# Patient Record
Sex: Male | Born: 1970 | Hispanic: No | Marital: Single | Smoking: Former smoker
Health system: Southern US, Community
[De-identification: ages and names within clinical notes are randomized; demographics above are authoritative.]

## PROBLEM LIST (undated history)

## (undated) DIAGNOSIS — F419 Anxiety disorder, unspecified: Secondary | ICD-10-CM

## (undated) DIAGNOSIS — Z973 Presence of spectacles and contact lenses: Secondary | ICD-10-CM

## (undated) DIAGNOSIS — F329 Major depressive disorder, single episode, unspecified: Secondary | ICD-10-CM

## (undated) DIAGNOSIS — I739 Peripheral vascular disease, unspecified: Secondary | ICD-10-CM

## (undated) DIAGNOSIS — N186 End stage renal disease: Secondary | ICD-10-CM

## (undated) DIAGNOSIS — D649 Anemia, unspecified: Secondary | ICD-10-CM

## (undated) DIAGNOSIS — E119 Type 2 diabetes mellitus without complications: Secondary | ICD-10-CM

## (undated) DIAGNOSIS — N189 Chronic kidney disease, unspecified: Secondary | ICD-10-CM

## (undated) DIAGNOSIS — F4310 Post-traumatic stress disorder, unspecified: Secondary | ICD-10-CM

## (undated) DIAGNOSIS — I509 Heart failure, unspecified: Secondary | ICD-10-CM

## (undated) DIAGNOSIS — Z5189 Encounter for other specified aftercare: Secondary | ICD-10-CM

## (undated) DIAGNOSIS — F32A Depression, unspecified: Secondary | ICD-10-CM

## (undated) DIAGNOSIS — G629 Polyneuropathy, unspecified: Secondary | ICD-10-CM

## (undated) DIAGNOSIS — E785 Hyperlipidemia, unspecified: Secondary | ICD-10-CM

## (undated) HISTORY — DX: Anxiety disorder, unspecified: F41.9

## (undated) HISTORY — PX: CHOLECYSTECTOMY: SHX55

## (undated) HISTORY — DX: Major depressive disorder, single episode, unspecified: F32.9

## (undated) HISTORY — DX: Depression, unspecified: F32.A

## (undated) HISTORY — PX: FOOT SURGERY: SHX648

## (undated) HISTORY — DX: Encounter for other specified aftercare: Z51.89

## (undated) HISTORY — DX: Hyperlipidemia, unspecified: E78.5

## (undated) HISTORY — PX: BELOW KNEE LEG AMPUTATION: SUR23

## (undated) SURGERY — Surgical Case
Anesthesia: *Unknown

---

## 2013-11-12 ENCOUNTER — Other Ambulatory Visit (HOSPITAL_COMMUNITY): Payer: Self-pay | Admitting: Internal Medicine

## 2013-11-12 DIAGNOSIS — M869 Osteomyelitis, unspecified: Secondary | ICD-10-CM

## 2013-11-13 ENCOUNTER — Ambulatory Visit (HOSPITAL_COMMUNITY)
Admission: RE | Admit: 2013-11-13 | Discharge: 2013-11-13 | Disposition: A | Discharge status: Home / Self Care | Payer: Medicaid Other | Source: Ambulatory Visit | Attending: Internal Medicine | Admitting: Internal Medicine

## 2013-11-13 DIAGNOSIS — M869 Osteomyelitis, unspecified: Secondary | ICD-10-CM

## 2013-11-13 DIAGNOSIS — Z452 Encounter for adjustment and management of vascular access device: Secondary | ICD-10-CM | POA: Insufficient documentation

## 2013-11-13 IMAGING — XA DG FLUORO GUIDE CV LINE
1 series · 3 of 3 positions shown · non-contrast
Comparison: none

CLINICAL DATA: Existing right arm PICC line unable to get blood
return, request for exchange.

[Series 1: run · 3 of 3 slices shown]
[im 1/3]
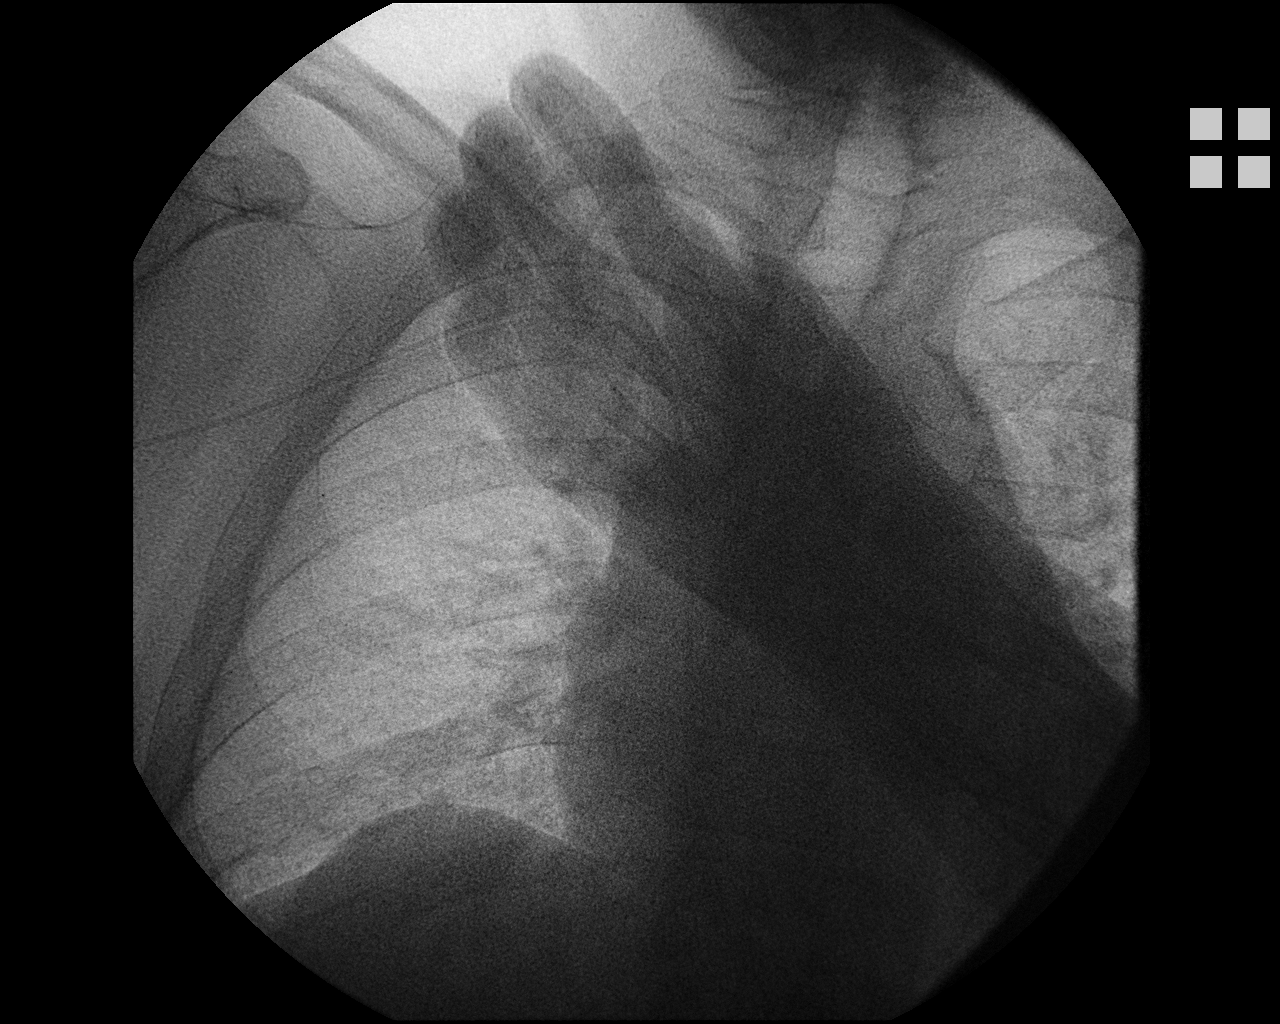
[im 2/3]
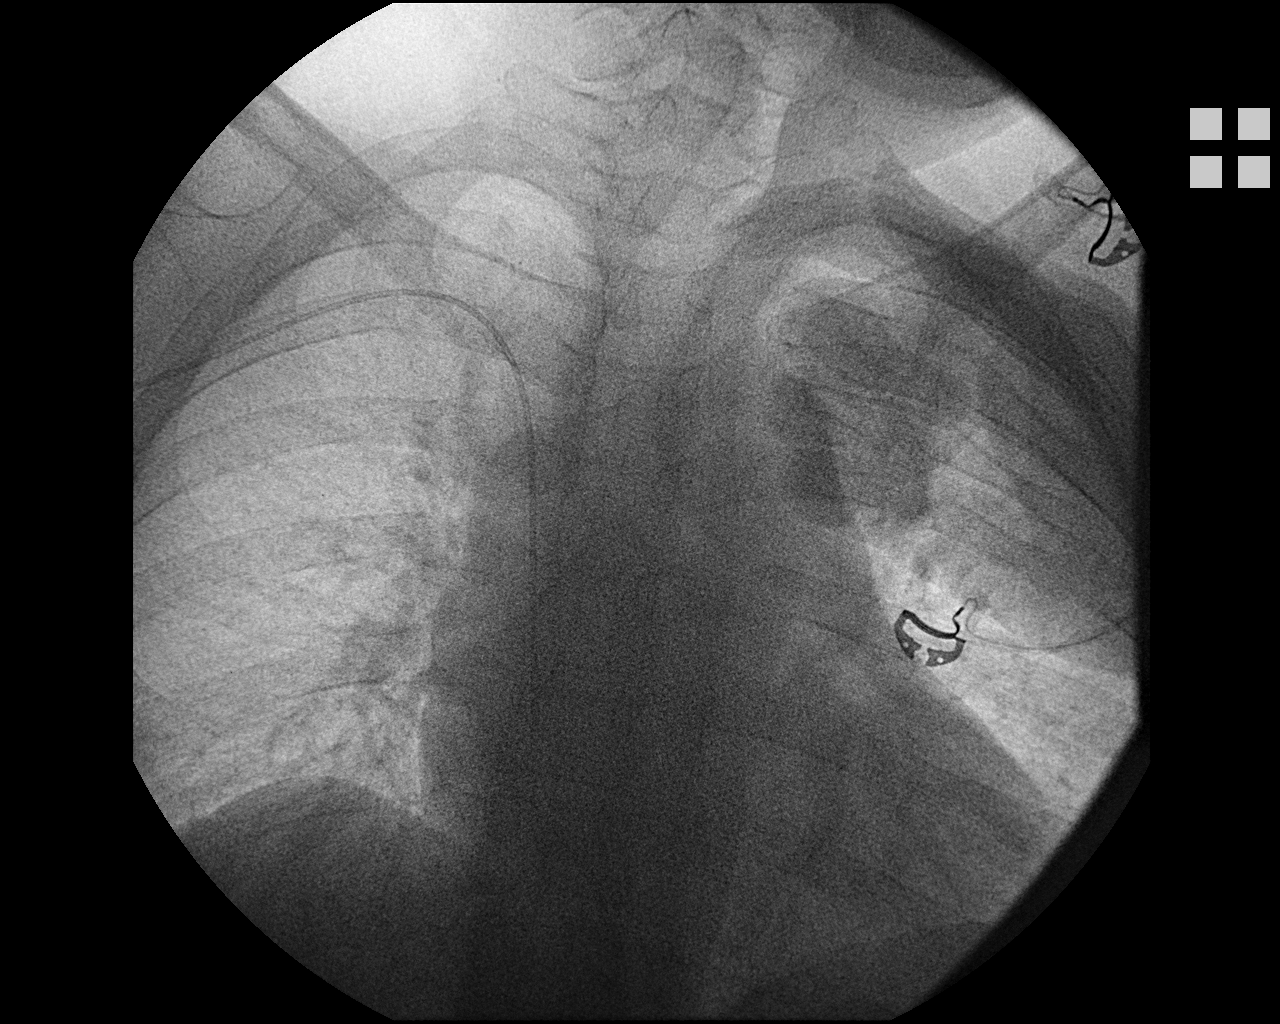
[im 3/3]
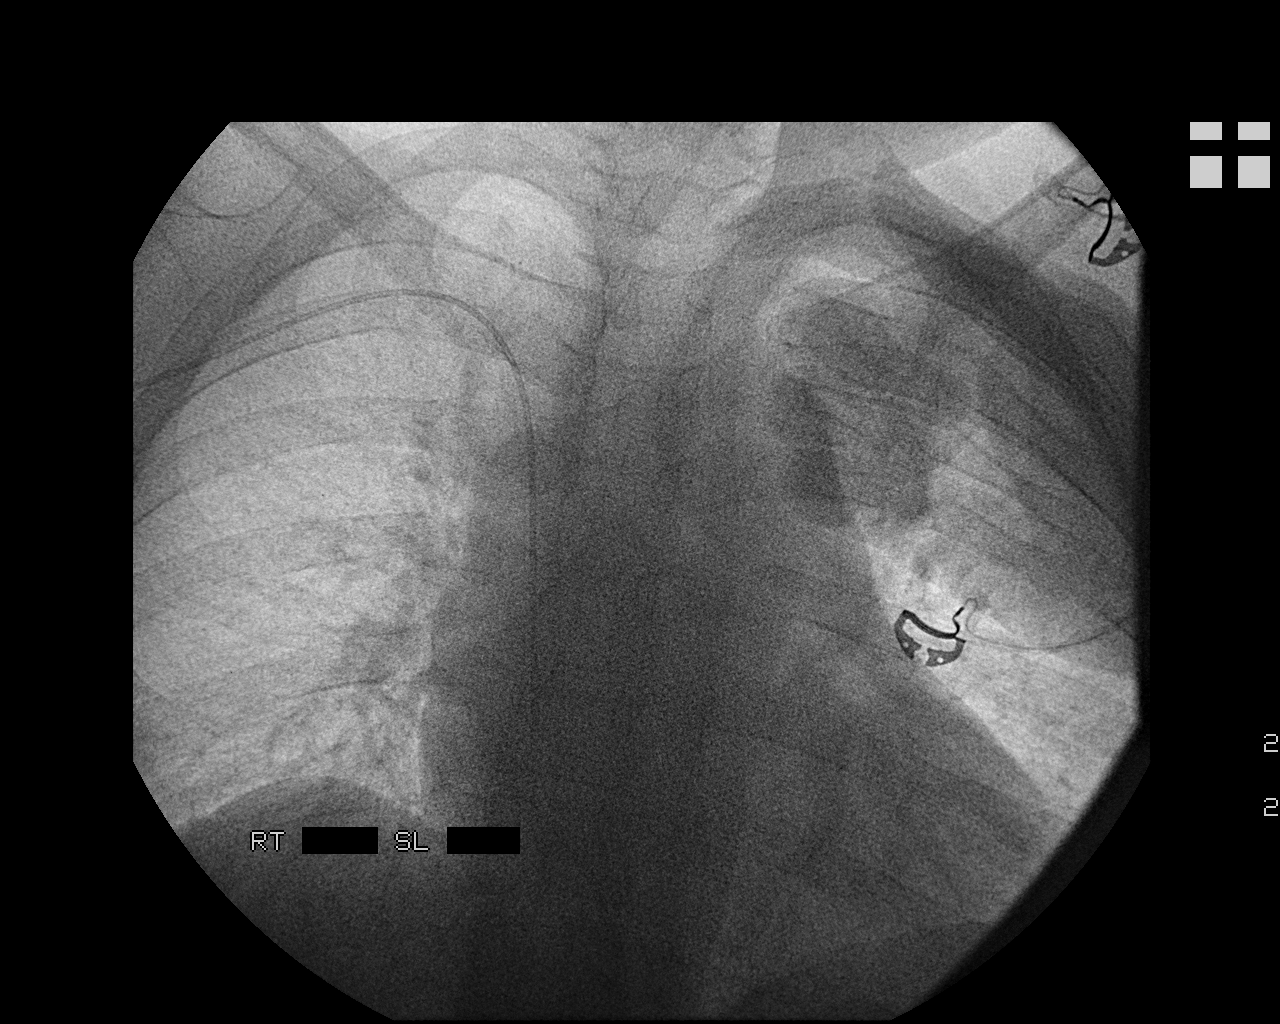

[3 of 3 positions shown; findings below may reference images not displayed]

EXAM:
SUCCESSFUL RIGHT ARM SINGLE LUMEN POWER INJECTABLE PICC LINE
EXCHANGE WITH FLUOROSCOPIC GUIDANCE

FLUOROSCOPY TIME:  30 seconds

PROCEDURE:
The patient was advised of the possible risks and complications and
agreed to undergo the procedure. The patient was then brought to the
angiographic suite for the procedure.

The right arm and existing PICC was prepped with chlorhexidine,
draped in the usual sterile fashion using maximum barrier technique
(cap and mask, sterile gown, sterile gloves, large sterile sheet,
hand hygiene and cutaneous antisepsis) and infiltrated locally with
1% Lidocaine.

The existing PICC was cut and a 0.018 wire was introduced in to the
vein. Over this, a 5 French single lumen power-injectable PICC was
advanced to the lower SVC/right atrial junction. Fluoroscopy during
the procedure and fluoro spot radiograph confirms appropriate
catheter position. The catheter was flushed and covered with a
sterile dressing.

Catheter length:  42 cm

Complications:  None immediate.
IMPRESSION: Successful right arm Power injectable PICC line exchange with
fluoroscopic guidance. The catheter is ready for use.

## 2013-11-13 MED ORDER — CHLORHEXIDINE GLUCONATE 4 % EX LIQD
CUTANEOUS | Status: AC
  Filled 2013-11-13: qty 15

## 2013-11-13 MED ORDER — LIDOCAINE HCL 1 % IJ SOLN
INTRAMUSCULAR | Status: AC
  Filled 2013-11-13: qty 20

## 2013-11-13 NOTE — Procedures (Signed)
Successful exchange of right arm single lumen PICC line. Length 42cm Tip at lower SVC/RA No complications Ready for use.  Tsosie Billing PA-C Interventional Radiology  11/13/13  10:07 AM

## 2013-11-18 ENCOUNTER — Emergency Department (HOSPITAL_COMMUNITY)
Admission: EM | Admit: 2013-11-18 | Discharge: 2013-11-18 | Disposition: A | Discharge status: Home / Self Care | Payer: 59 | Attending: Emergency Medicine | Admitting: Emergency Medicine

## 2013-11-18 ENCOUNTER — Encounter (HOSPITAL_COMMUNITY): Payer: Self-pay | Admitting: Emergency Medicine

## 2013-11-18 DIAGNOSIS — Z8669 Personal history of other diseases of the nervous system and sense organs: Secondary | ICD-10-CM | POA: Insufficient documentation

## 2013-11-18 DIAGNOSIS — E119 Type 2 diabetes mellitus without complications: Secondary | ICD-10-CM | POA: Insufficient documentation

## 2013-11-18 DIAGNOSIS — Z7982 Long term (current) use of aspirin: Secondary | ICD-10-CM | POA: Insufficient documentation

## 2013-11-18 DIAGNOSIS — F911 Conduct disorder, childhood-onset type: Secondary | ICD-10-CM | POA: Insufficient documentation

## 2013-11-18 DIAGNOSIS — Z79899 Other long term (current) drug therapy: Secondary | ICD-10-CM | POA: Diagnosis not present

## 2013-11-18 DIAGNOSIS — S91331A Puncture wound without foreign body, right foot, initial encounter: Secondary | ICD-10-CM | POA: Insufficient documentation

## 2013-11-18 DIAGNOSIS — D649 Anemia, unspecified: Secondary | ICD-10-CM | POA: Insufficient documentation

## 2013-11-18 DIAGNOSIS — R454 Irritability and anger: Secondary | ICD-10-CM

## 2013-11-18 DIAGNOSIS — Z046 Encounter for general psychiatric examination, requested by authority: Secondary | ICD-10-CM | POA: Diagnosis present

## 2013-11-18 DIAGNOSIS — Z5189 Encounter for other specified aftercare: Secondary | ICD-10-CM

## 2013-11-18 HISTORY — DX: Type 2 diabetes mellitus without complications: E11.9

## 2013-11-18 HISTORY — DX: Polyneuropathy, unspecified: G62.9

## 2013-11-18 HISTORY — DX: Anemia, unspecified: D64.9

## 2013-11-18 HISTORY — DX: Post-traumatic stress disorder, unspecified: F43.10
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
## 2013-11-18 NOTE — Discharge Instructions (Signed)
°  Try to remain calm, and to avoid confrontations.  Call your primary care doctor for an appointment to be seen, and had her wounds checked within the next week. Also, asked them about restarting the wound VAC. Return here, if needed, for problems.     Anger Management Anger is a normal human emotion. However, anger can range from mild irritation to rage. When your anger becomes harmful to yourself or others, it is unhealthy anger.  CAUSES  There are many reasons for unhealthy anger. Many people learn how to express anger from observing how their family expressed anger. In troubled, chaotic, or abusive families, anger can be expressed as rage or even violence. Children can grow up never learning how healthy anger can be expressed. Factors that contribute to unhealthy anger include:   Drug or alcohol abuse.  Post-traumatic stress disorder.  Traumatic brain injury. COMPLICATIONS  People with unhealthy anger tend to overreact and retaliate against a real or imagined threat. The need to retaliate can turn into violence or verbal abuse against another person. Chronic anger can lead to health problems, such as hypertension, high blood pressure, and depression. TREATMENT  Exercising, relaxing, meditating, or writing out your feelings all can be beneficial in managing moderate anger. For unhealthy anger, the following methods may be used:  Cognitive-behavioral counseling (learning skills to change the thoughts that influence your mood).  Relaxation training.  Interpersonal counseling.  Assertive communication skills.  Medication. Document Released: 11/21/2006 Document Revised: 04/18/2011 Document Reviewed: 04/01/2010 [facility] Patient Information 2015 [place], [place]. This information is not intended to replace advice given to you by your health care provider. Make sure you discuss any questions you have with your health care provider.

## 2013-11-18 NOTE — ED Notes (Signed)
Report given to [place], Arbie Cookey. Patient with no complaints at this time. Respirations even and unlabored. Skin warm/dry. Discharge instructions reviewed with patient at this time. Patient given opportunity to voice concerns/ask questions. Patient discharged at this time and left Emergency Department in wheelchair.

## 2013-12-02 ENCOUNTER — Other Ambulatory Visit (HOSPITAL_COMMUNITY): Payer: Self-pay | Admitting: Internal Medicine

## 2013-12-02 ENCOUNTER — Ambulatory Visit (HOSPITAL_COMMUNITY)
Admission: RE | Admit: 2013-12-02 | Discharge: 2013-12-02 | Disposition: A | Discharge status: Home / Self Care | Payer: Medicaid Other | Source: Ambulatory Visit | Attending: Internal Medicine | Admitting: Internal Medicine

## 2013-12-02 DIAGNOSIS — Z452 Encounter for adjustment and management of vascular access device: Secondary | ICD-10-CM | POA: Insufficient documentation

## 2013-12-02 DIAGNOSIS — B999 Unspecified infectious disease: Secondary | ICD-10-CM

## 2013-12-02 IMAGING — XA IR FLUORO GUIDE CV LINE*R*
2 series · 3 of 3 positions shown · non-contrast
Comparison: none

CLINICAL DATA: 43-year-old male with poorly functioning right upper
extremity PICC. The catheter flushes but will not aspirate. He
requires the line for at least 2 additional weeks including used for
blood draws. He presents for PICC exchanged

EXAM:
IR RIGHT FLOURO GUIDE CV LINE (PICC) EXCHANGE
FLUOROSCOPY TIME:  6 seconds
3 mGy
TECHNIQUE: The right arm was prepped with chlorhexidine, draped in the usual
sterile fashion using maximum barrier technique (cap and mask,
sterile gown, sterile gloves, large sterile sheet, hand hygiene and
cutaneous antiseptic). Local anesthesia was attained by infiltration
with 1% lidocaine.

[Series 1: fl neuro · 1 of 1 slices shown (1 of 2)]
[im 1/1]
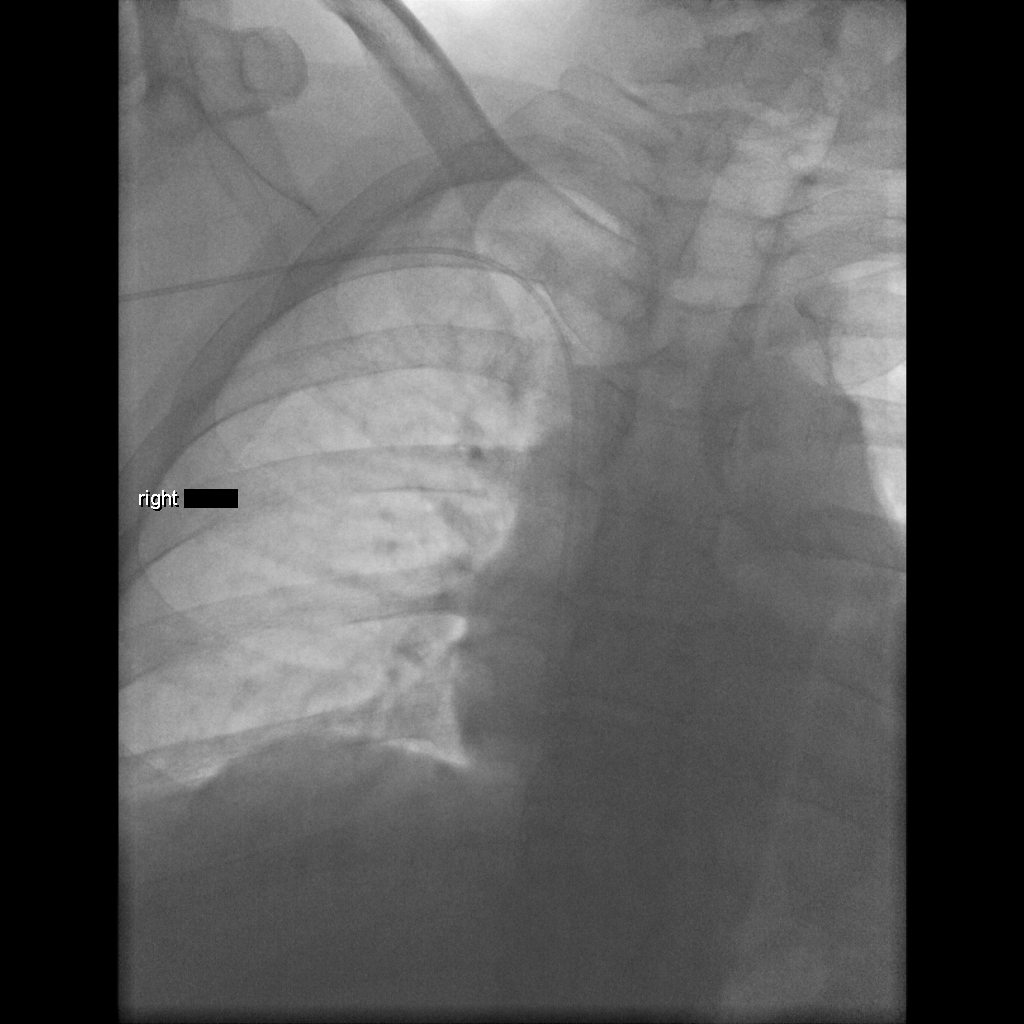

[Series 2: fl neuro · 2 of 2 slices shown (2 of 2)]
[im 1/2]
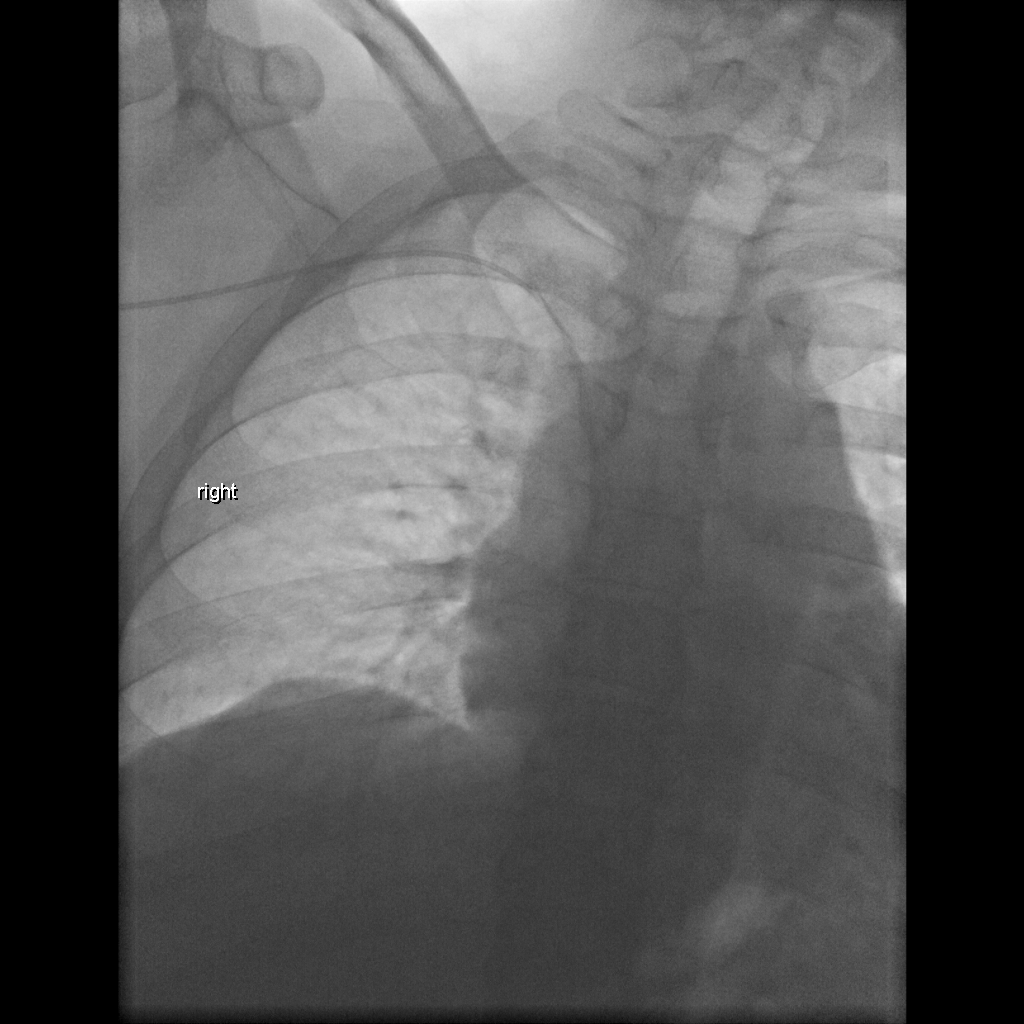
[im 2/2]
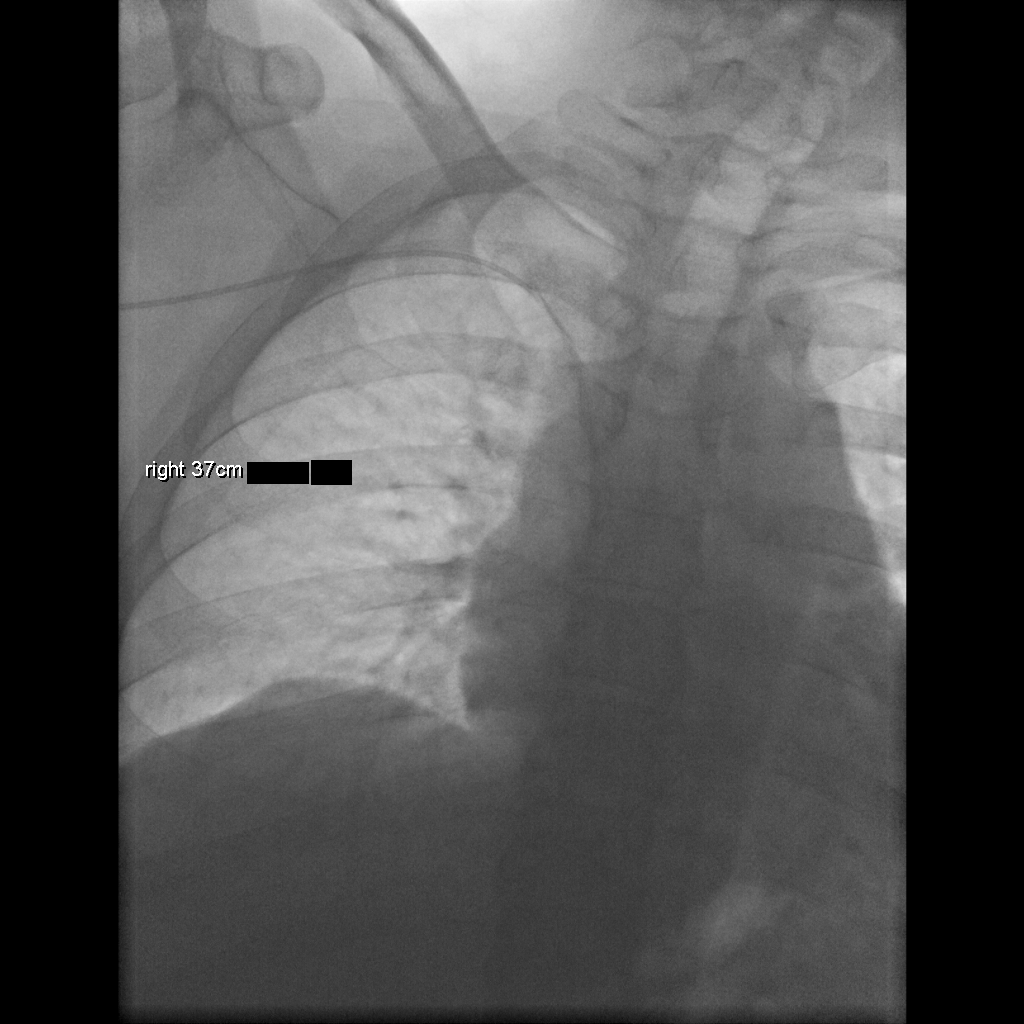

[3 of 3 positions shown; findings below may reference images not displayed]

The sutured to the existing PICC was cut. The catheter was cut close
to the skin edge and a 0.018 inch wire threaded through the catheter
and into the heart. The PICC was removed over the wire. A new
peel-away sheath was advanced over the wire into the upper arm. A
new 37 cm single-lumen power injectable PICC was advanced, and
positioned with its tip at the lower SVC/right atrial junction.
Fluoroscopy during the procedure and fluoro spot radiograph confirms
appropriate catheter position. The catheter was flushed, secured to
the skin with Prolene sutures, and covered with a sterile dressing.

COMPLICATIONS:
None.  The patient tolerated the procedure well.
IMPRESSION: Successful exchange for a new right upper extremity single-lumen
power PICC with fluoroscopic guidance. The catheter is ready for
use.

## 2013-12-02 MED ORDER — CHLORHEXIDINE GLUCONATE 4 % EX LIQD
CUTANEOUS | Status: DC
Start: 2013-12-02 — End: 2013-12-03
  Filled 2013-12-02: qty 15

## 2013-12-02 MED ORDER — HEPARIN SOD (PORK) LOCK FLUSH 100 UNIT/ML IV SOLN
INTRAVENOUS | Status: AC
  Filled 2013-12-02: qty 5

## 2013-12-02 MED ORDER — LIDOCAINE HCL 1 % IJ SOLN
INTRAMUSCULAR | Status: AC
  Filled 2013-12-02: qty 20

## 2015-03-03 ENCOUNTER — Ambulatory Visit (INDEPENDENT_AMBULATORY_CARE_PROVIDER_SITE_OTHER): Payer: Medicaid Other | Admitting: Family Medicine

## 2015-03-03 ENCOUNTER — Encounter: Payer: Self-pay | Admitting: Family Medicine

## 2015-03-03 VITALS — BP 130/79 | HR 91 | Temp 98.0°F | Ht 72.0 in

## 2015-03-03 DIAGNOSIS — Z794 Long term (current) use of insulin: Secondary | ICD-10-CM

## 2015-03-03 DIAGNOSIS — M549 Dorsalgia, unspecified: Secondary | ICD-10-CM | POA: Insufficient documentation

## 2015-03-03 DIAGNOSIS — E11621 Type 2 diabetes mellitus with foot ulcer: Secondary | ICD-10-CM | POA: Diagnosis not present

## 2015-03-03 DIAGNOSIS — L97509 Non-pressure chronic ulcer of other part of unspecified foot with unspecified severity: Secondary | ICD-10-CM | POA: Diagnosis not present

## 2015-03-03 DIAGNOSIS — M5442 Lumbago with sciatica, left side: Secondary | ICD-10-CM

## 2015-03-03 DIAGNOSIS — E119 Type 2 diabetes mellitus without complications: Secondary | ICD-10-CM | POA: Insufficient documentation

## 2015-03-03 DIAGNOSIS — E1122 Type 2 diabetes mellitus with diabetic chronic kidney disease: Secondary | ICD-10-CM | POA: Insufficient documentation

## 2015-03-03 DIAGNOSIS — E785 Hyperlipidemia, unspecified: Secondary | ICD-10-CM | POA: Diagnosis not present

## 2015-03-03 DIAGNOSIS — M5441 Lumbago with sciatica, right side: Secondary | ICD-10-CM

## 2015-03-03 DIAGNOSIS — M545 Low back pain, unspecified: Secondary | ICD-10-CM | POA: Insufficient documentation

## 2015-03-03 LAB — POCT GLYCOSYLATED HEMOGLOBIN (HGB A1C): HEMOGLOBIN A1C: 8.2

## 2015-03-03 MED ORDER — TRAMADOL HCL 50 MG PO TABS: 50.0000 mg | ORAL_TABLET | Freq: Two times a day (BID) | ORAL | Status: DC | PRN

## 2015-03-03 NOTE — Progress Notes (Signed)
   HPI  Patient presents today here today to establish care and to discuss pain.  Explains that he has recently moved from [facility]. Previously he was homeless and had a lot of difficulty with diabetes management and developing ulcers which caused amputations.  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  He describes that in April 2016 he was admitted to [facility] for suicidal attempt and stayed there for about 6 weeks. He denies suicidal ideation today, depression, and anxiety.  He would like to pursue getting a prosthesis for his right lower extremity.  He's been out of several medications for at least 6 weeks  He was previously on Xanax, BuSpar, Pletal, ferrous sulfate, oxycodone, and trazodone. He explains that he's been off of Xanax and oxycodone since he was admitted to [facility] in April.  He is now living with her friends locally. He has no problem obtaining food. States he was previously on 10 units of 70/30 every day.  He describes generalized low back pain with intermittent sciatica, he has a lot of discomfort as he has to stay in the wheelchair constantly. He's previously been told not to take NSAIDs due to renal function Tylenol is not helping   PMH: Smoking status noted His past medical, surgical, social, and family history reviewed and updated in EMR ROS: Per HPI  Objective: BP 130/79 mmHg  Pulse 91  Temp(Src) 98 F (36.7 C) (Oral)  Ht 6' (1.829 m)  Wt  Gen: NAD, alert, cooperative with exam HEENT: NCAT, TMs normal bilaterally, moist mucous membranes, nares clear CV: RRR, good S1/S2, no murmur Resp: CTABL, no wheezes, non-labored Ext: Right lower extremity with BKA with well-healed scar on his stump, his left lower extremity has his first and second toe amputated with well-healed scars Neuro: Alert and oriented, decreased sensation on left lower  extremity, no sensation to monofilament across plantar foot and up to his knee on his left lower extremity  Assessment and plan:  # Diabetes Has not been on any medications for several months Previously treated with insulin A1c today is 8.3, I will await his renal function to make more decisions. Likely start Lantus  # Hyperlipidemia Continue Lipitor Fasting labs today  # Mood disorder PTSD, anxiety, depression He has been hospitalized for a very significant amount of time for suicidal attempt He denies suicidal thoughts today, depression, and anxiety. No medications for now, avoid opiates and benzodiazepines  # Back pain I suspect this is really just mechanical back pain from sitting in his wheelchair so frequently Think it's very likely that his renal function will be to 4 to justify using NSAIDs, Tylenol is not helping Tramadol  Follow-up one month   Orders Placed This Encounter  Procedures  . CMP14+EGFR  . CBC  . Lipid Panel  . TSH + free T4  . POCT glycosylated hemoglobin (Hb A1C)    Meds ordered this encounter  Medications  . traMADol (ULTRAM) 50 MG tablet    Sig: Take 1 tablet (50 mg total) by mouth every 12 (twelve) hours as needed.    Dispense:  60 tablet    Refill:  [place], MD [facility] 03/03/2015, 3:06 PM

## 2015-03-03 NOTE — Patient Instructions (Signed)
Great to meet you!  Lets see you back in 1 month  We will call with your results within 1 week  I will ask around about the best way to pursue prosthesis

## 2015-03-04 ENCOUNTER — Encounter: Payer: Self-pay | Admitting: Family Medicine

## 2015-03-04 ENCOUNTER — Other Ambulatory Visit: Payer: Self-pay | Admitting: Family Medicine

## 2015-03-04 DIAGNOSIS — L97509 Non-pressure chronic ulcer of other part of unspecified foot with unspecified severity: Secondary | ICD-10-CM

## 2015-03-04 DIAGNOSIS — Z794 Long term (current) use of insulin: Secondary | ICD-10-CM

## 2015-03-04 DIAGNOSIS — N184 Chronic kidney disease, stage 4 (severe): Secondary | ICD-10-CM

## 2015-03-04 DIAGNOSIS — E11621 Type 2 diabetes mellitus with foot ulcer: Secondary | ICD-10-CM

## 2015-03-04 LAB — CBC
HEMATOCRIT: 32.4 % — AB (ref 37.5–51.0)
Hemoglobin: 10.6 g/dL — ABNORMAL LOW (ref 12.6–17.7)
MCH: 29.8 pg (ref 26.6–33.0)
MCHC: 32.7 g/dL (ref 31.5–35.7)
MCV: 91 fL (ref 79–97)
Platelets: 259 10*3/uL (ref 150–379)
RBC: 3.56 x10E6/uL — AB (ref 4.14–5.80)
RDW: 13.2 % (ref 12.3–15.4)
WBC: 9.6 10*3/uL (ref 3.4–10.8)

## 2015-03-04 LAB — CMP14+EGFR
ALT: 11 IU/L (ref 0–44)
AST: 10 IU/L (ref 0–40)
Albumin/Globulin Ratio: 1.2 (ref 1.1–2.5)
Albumin: 3.1 g/dL — ABNORMAL LOW (ref 3.5–5.5)
Alkaline Phosphatase: 106 IU/L (ref 39–117)
BUN/Creatinine Ratio: 9 (ref 9–20)
BUN: 26 mg/dL — AB (ref 6–24)
Bilirubin Total: <0.2 mg/dL (ref 0.0–1.2)
CO2: 19 mmol/L (ref 18–29)
CREATININE: 3.05 mg/dL — AB (ref 0.76–1.27)
Calcium: 8.8 mg/dL (ref 8.7–10.2)
Chloride: 104 mmol/L (ref 96–106)
GFR calc Af Amer: 28 mL/min/{1.73_m2} — ABNORMAL LOW (ref >59)
GFR calc non Af Amer: 24 mL/min/{1.73_m2} — ABNORMAL LOW (ref >59)
GLUCOSE: 162 mg/dL — AB (ref 65–99)
Globulin, Total: 2.5 g/dL (ref 1.5–4.5)
Potassium: 5.4 mmol/L — ABNORMAL HIGH (ref 3.5–5.2)
Sodium: 139 mmol/L (ref 134–144)
Total Protein: 5.6 g/dL — ABNORMAL LOW (ref 6.0–8.5)

## 2015-03-04 LAB — LIPID PANEL
CHOL/HDL RATIO: 7.9 ratio — AB (ref 0.0–5.0)
Cholesterol, Total: 322 mg/dL — ABNORMAL HIGH (ref 100–199)
HDL: 41 mg/dL (ref >39)
TRIGLYCERIDES: 471 mg/dL — AB (ref 0–149)

## 2015-03-04 LAB — TSH+FREE T4
Free T4: 0.98 ng/dL (ref 0.82–1.77)
TSH: 2.68 u[IU]/mL (ref 0.450–4.500)

## 2015-03-05 ENCOUNTER — Telehealth: Payer: Self-pay | Admitting: Family Medicine

## 2015-03-05 DIAGNOSIS — Z89512 Acquired absence of left leg below knee: Secondary | ICD-10-CM | POA: Insufficient documentation

## 2015-03-05 DIAGNOSIS — Z89511 Acquired absence of right leg below knee: Secondary | ICD-10-CM | POA: Insufficient documentation

## 2015-03-05 DIAGNOSIS — N184 Chronic kidney disease, stage 4 (severe): Secondary | ICD-10-CM

## 2015-03-05 MED ORDER — ATORVASTATIN CALCIUM 10 MG PO TABS: 10.0000 mg | ORAL_TABLET | Freq: Every evening | ORAL | Status: DC

## 2015-03-05 NOTE — Telephone Encounter (Signed)
Called and discussed labs, He has a nephro referral in by Dr. Livia Snellen in response to his critical high cre.   His K is elevated, we will need to repeat at our next visit. No palpitations.   Tramadol is working well, with his ESRD consider BID as max dose.   His cholesterol and trigs are high. There is some discussion about bvenefit of Statins in ESRD (consider him CKD 4) but considering his numbers and DM2 I am restarting lipitor.  Trigs hopefully improve with diabetic control. No fibrate for now, medicaid will not cover Vascepa/lovaza and I want to move step wise with most important things first.   I explained most important is restarting insulin, then statin. Insulin dosing carefully with reduced clearance due to renal disease so I will have him follow up within 1 week with our clinical pharmacist for likely lantus.   Refer to ortho for prosthesis eval.   Laroy Apple, MD [facility] 03/05/2015, 12:09 PM

## 2015-03-13 NOTE — Telephone Encounter (Signed)
Detailed message left with patients emergency contact number to have patient call us.

## 2015-03-17 NOTE — Telephone Encounter (Signed)
Multiple attempts have been made to contact pt and he hasn't returned our calls. Will close encounter.

## 2015-04-03 ENCOUNTER — Encounter: Payer: Self-pay | Admitting: Family Medicine

## 2015-04-03 ENCOUNTER — Ambulatory Visit (INDEPENDENT_AMBULATORY_CARE_PROVIDER_SITE_OTHER): Payer: Medicaid Other | Admitting: Family Medicine

## 2015-04-03 VITALS — BP 143/85 | HR 89 | Temp 97.0°F

## 2015-04-03 DIAGNOSIS — Z89511 Acquired absence of right leg below knee: Secondary | ICD-10-CM | POA: Diagnosis not present

## 2015-04-03 DIAGNOSIS — L97509 Non-pressure chronic ulcer of other part of unspecified foot with unspecified severity: Secondary | ICD-10-CM | POA: Diagnosis not present

## 2015-04-03 DIAGNOSIS — M5442 Lumbago with sciatica, left side: Secondary | ICD-10-CM | POA: Diagnosis not present

## 2015-04-03 DIAGNOSIS — N184 Chronic kidney disease, stage 4 (severe): Secondary | ICD-10-CM

## 2015-04-03 DIAGNOSIS — E11621 Type 2 diabetes mellitus with foot ulcer: Secondary | ICD-10-CM | POA: Diagnosis not present

## 2015-04-03 DIAGNOSIS — M5441 Lumbago with sciatica, right side: Secondary | ICD-10-CM | POA: Diagnosis not present

## 2015-04-03 DIAGNOSIS — Z794 Long term (current) use of insulin: Secondary | ICD-10-CM | POA: Diagnosis not present

## 2015-04-03 DIAGNOSIS — N189 Chronic kidney disease, unspecified: Secondary | ICD-10-CM

## 2015-04-03 MED ORDER — INSULIN GLARGINE 100 UNIT/ML SOLOSTAR PEN: 15.0000 [IU] | PEN_INJECTOR | Freq: Every day | SUBCUTANEOUS | Status: DC

## 2015-04-03 MED ORDER — INSULIN PEN NEEDLE 31G X 5 MM MISC: 1.0000 | Freq: Every day | Status: DC

## 2015-04-03 NOTE — Patient Instructions (Signed)
Great to see you!  Lets see you in 1 month for diabetes discussion  Start lantus, 15 units once daily Increase by 2 units per week until your fasting blood sugar is between 120 and 140 Do not go higher than 25 units before returning to the clinic

## 2015-04-03 NOTE — Progress Notes (Signed)
HPI  Patient presents today here for follow-up.  Status post amputation Patient feels he needs a new wheelchair. His current one seems to be getting worn out. He has an appointment with orthopedic surgeryat the end of next month to discuss options for prosthesis.  Diabetes Was started on insulin at the emergency room, he did not follow-up with our pharmacist as requested. He's tolerating insulin 70/30, 10 units a.m., 15 units p.m. Very well. No hypoglycemia Fasting blood sugar seems to be 130 - 150. He does not take it every single day he waits for his blood sugar to elevate in the morningto go ahead and take the dose. He is previously done well on Lantus.  PTSD Requests medication for his "nerves". States he did well on Xanax previously he understands why I declined to prescribe this at this time.  Danelle Berry so security so that he can receive his own check.  Kidney disease He has seen a nephrologist since our last visit, he has labs following with them  PMH: Smoking status noted ROS: Per HPI  Objective: BP 143/85 mmHg  Pulse 89  Temp(Src) 97 F (36.1 C) (Oral)  Ht   Wt  Gen: NAD, alert, cooperative with exam HEENT: NCAT CV: RRR, good S1/S2, no murmur Resp: CTABL, no wheezes, non-labored Ext: trace pitting edema LLE, R sided BKA Neuro: Alert and oriented to person, place, time, situation, and president  Assessment and plan:  # R sided BKA New wheelchair ordered, has ortho scheduled  # PTSD Declined Benzos Offered SSRI but I think its best he goes to [facility]  # Back pain Doing well with 2 pills of tramadol daily  # DM2 Uncontrolled, has restarted insulin I am changing from 70/30 to lantus given that he is taking it in reaction to hyperglycemia No problems with access to food No hypoglycemia Eye exam, optho ordered RTC 1 month Lantus 15 units increase by 2 units per week until fasting 130-150, stop at 25 units   ADL survey- No limitations in bathing,  dressing, grooming, mouth care, toileting, transferring bed to chair, or eating. Cannotwalk or climb stairs IADLs- independent in cooking, managing medications, using phone numbers are looking up numbers,doing laundry, or managing finances. Needs help with shopping, doing housework, or driving/using public transportation  I have written the letter so he can receive his check   Orders Placed This Encounter  Procedures  . DME Wheelchair manual    Patient suffers from R sided amputation which impairs their ability to perform daily activities like walking in the home.  A cane, crutch or walker will not resolve  issue with performing activities of daily living. A wheelchair will allow patient to safely perform daily activities. Patient can safely propel the wheelchair in the home or has a caregiver who can provide assistance.  Accessories:  wheel locks, extensions and anti-tippers.  . Ambulatory referral to Ophthalmology    Referral Priority:  Routine    Referral Type:  Consultation    Referral Reason:  Specialty Services Required    Requested Specialty:  Ophthalmology    Number of Visits Requested:  1    Meds ordered this encounter  Medications  . Insulin Glargine (LANTUS SOLOSTAR) 100 UNIT/ML Solostar Pen    Sig: Inject 15-25 Units into the skin daily at 10 pm.    Dispense:  5 pen    Refill:  PRN    Please dispense QS for 1 month  . Insulin Pen Needle 31G X 5 MM  MISC    Sig: 1 Device by Does not apply route daily.    Dispense:  30 each    Refill:  [place], MD [facility] 04/03/2015, 12:06 PM

## 2015-04-14 DIAGNOSIS — Z0289 Encounter for other administrative examinations: Secondary | ICD-10-CM

## 2015-04-24 ENCOUNTER — Telehealth: Payer: Self-pay | Admitting: Family Medicine

## 2015-04-24 NOTE — Telephone Encounter (Signed)
Pt aware not ready at this time

## 2015-05-15 ENCOUNTER — Telehealth: Payer: Self-pay | Admitting: Family Medicine

## 2015-06-01 ENCOUNTER — Other Ambulatory Visit: Payer: Self-pay | Admitting: *Deleted

## 2015-06-01 NOTE — Telephone Encounter (Signed)
Last filled 05/02/15, last seen 04/03/15. Route to pool, can now be called in. He uses [facility]

## 2015-06-02 MED ORDER — TRAMADOL HCL 50 MG PO TABS: 50.0000 mg | ORAL_TABLET | Freq: Two times a day (BID) | ORAL | Status: DC | PRN

## 2015-06-02 NOTE — Telephone Encounter (Signed)
Prescription called in to [facility], tried to contact patient but was unable to reach

## 2015-07-22 ENCOUNTER — Ambulatory Visit: Payer: Medicaid Other | Attending: Student | Admitting: Physical Therapy

## 2015-07-22 DIAGNOSIS — R2689 Other abnormalities of gait and mobility: Secondary | ICD-10-CM | POA: Diagnosis present

## 2015-07-22 DIAGNOSIS — R2681 Unsteadiness on feet: Secondary | ICD-10-CM | POA: Insufficient documentation

## 2015-07-22 DIAGNOSIS — R29898 Other symptoms and signs involving the musculoskeletal system: Secondary | ICD-10-CM | POA: Diagnosis present

## 2015-07-22 NOTE — Therapy (Signed)
[facility] [address] [place], [place], [address] Phone: [phone]   Fax:  [phone]  Physical Therapy Evaluation  Patient Details  Name: Brendan Campbell MRN: [id number] Date of Birth: February 10, 1970 Referring Provider: Mechele Claude PA  Encounter Date: 07/22/2015      PT End of Session - 07/22/15 1418    Visit Number 1   Number of Visits 9   Date for PT Re-Evaluation --   Authorization Type Medicaid   PT Start Time [phone]   PT Stop Time 1008   PT Time Calculation (min) 37 min   Equipment Utilized During Treatment Gait belt   Activity Tolerance Patient tolerated treatment well   Behavior During Therapy [facility] for tasks assessed/performed      Past Medical History  Diagnosis Date  . PTSD (post-traumatic stress disorder)   . Diabetes mellitus without complication ([place])   . Neuropathy ([place])   . Anemia   . Anxiety   . Depression   . Blood transfusion without reported diagnosis   . Hyperlipidemia     Past Surgical History  Procedure Laterality Date  . Foot surgery    . Cholecystectomy      There were no vitals filed for this visit.       Subjective Assessment - 07/22/15 0937    Subjective The pt is a 45 yr old male here following a R transtibial amputation December 22, 2013 with slow, delayed healing. He received his first prosthesis 07/14/15 and is dependent in progressing wear & use. He presents for PT evaluation and prosthetic training.    Pertinent History LBP, Chronic renal stage 4, DM2, neuropathy, suicide attempt (april 2016), neuropathy, 1st and 2nd toe amputation LLE   Limitations Lifting;Standing;Walking   Patient Stated Goals Walking with prosthesis in home & community.   Currently in Pain? No/denies            [facility] PT Assessment - 07/22/15 0930    Assessment   Medical Diagnosis R transtibial amputation    Referring Provider Mechele Claude PA   Onset Date/Surgical Date 07/14/15  Prosthesis  delivery   Hand Dominance Right   Precautions   Precautions None   Restrictions   Weight Bearing Restrictions No   Balance Screen   Has the patient fallen in the past 6 months No   Has the patient had a decrease in activity level because of a fear of falling?  No   Is the patient reluctant to leave their home because of a fear of falling?  No   Home Social worker Private residence  [place] Non-relatives/Friends  lives with friends including 63yo at times   Available Help at Discharge Friend(s)   Type of [place] entrance   Home Layout One level   Home Equipment Wheelchair - [employer] - 2 wheels   Prior Function   Level of Independence Independent with household mobility with device   Vocation On disability   Cognition   Overall Cognitive Status Within Functional Limits for tasks assessed   Observation/Other Assessments   Focus on Therapeutic Outcomes (FOTO)  51.56 Functional Status   Fear Avoidance Belief Questionnaire (FABQ)  47 (13)   Posture/Postural Control   Posture/Postural Control Postural limitations   Postural Limitations Rounded Shoulders;Forward head;Posterior pelvic tilt;Flexed trunk;Weight shift left   ROM / Strength   AROM / PROM / Strength Strength   Strength   Overall Strength  Within functional limits for tasks performed   Transfers   Transfers Sit to Stand;Stand to Sit   Sit to Stand 5: Supervision;With upper extremity assist;From chair/3-in-1;With armrests  requires touch on RW to stabilize, partial wt on prosthesis   Stand to Sit 5: Supervision;With upper extremity assist;To chair/3-in-1;With armrests  partial wt on prosthesis    Ambulation/Gait   Ambulation/Gait Yes   Ambulation/Gait Assistance 5: Supervision   Ambulation/Gait Assistance Details Pt ambulates slowly with step to pattern, left lower extremity is adducted & externally rotated, while right lower extremity /prosthesis is  abducted. The pt demonstrates pelvic instability and trendlengerg gait. While walking pt caught toe of prosthesis but was able to recover with use of RW   Ambulation Distance (Feet) 40 Feet   Assistive device Prosthesis;Rolling walker   Gait Pattern Step-to pattern;Decreased stance time - right;Decreased step length - left;Decreased stride length;Trunk flexed;Trendelenburg;Poor foot clearance - right;Right hip hike;Right circumduction;Decreased weight shift to right;Decreased hip/knee flexion - right;Abducted- right   Ambulation Surface Level;Indoor   Gait velocity 0.67 ft/s  <1.8 ft/s indicates fall risk; <1.31 ft/s indicates househol   Standardized Balance Assessment   Standardized Balance Assessment Berg Balance Test   Berg Balance Test   Sit to Stand Needs minimal aid to stand or to stabilize  Uses RW to stabilize   Standing Unsupported Needs several tries to stand 30 seconds unsupported  stands safely 2 minutes with RW   Sitting with Back Unsupported but Feet Supported on Floor or Stool Able to sit safely and securely 2 minutes   Stand to Sit Controls descent by using hands   Transfers Able to transfer safely, definite need of hands   Standing Unsupported with Eyes Closed Needs help to keep from falling   Standing Ubsupported with Feet Together Needs help to attain position and unable to hold for 15 seconds   From Standing, Reach Forward with Outstretched Arm Loses balance while trying/requires external support  7in with RW   From Standing Position, Pick up Object from Floor Unable to pick up and needs supervision  supervision with RW able to pick up   From Standing Position, Turn to Look Behind Over each Shoulder Needs supervision when turning  with RW looks > to one side   Turn 360 Degrees Needs assistance while turning   Standing Unsupported, Alternately Place Feet on Step/Stool Needs assistance to keep from falling or unable to try   Standing Unsupported, One Foot in [employer]  balance while stepping or standing   Standing on One Leg Unable to try or needs assist to prevent fall   Total Score 14         Prosthetics Assessment - 07/22/15 0930    Prosthetics   Prosthetic Care Dependent with Residual limb care;Skin check;Care of non-amputated limb;Ply sock cleaning;Prosthetic cleaning;Correct ply sock adjustment;Proper wear schedule/adjustment;Proper weight-bearing schedule/adjustment   Donning prosthesis  +1 Total assist   Doffing prosthesis  +1 Total assist   Current prosthetic wear tolerance (days/week)  5 out of 8 days since delivery   Current prosthetic wear tolerance (#hours/day)  Reports prior to eval built up to 45 min 1-2x/day. PT recommended increasing to 2hrs 2x/day with increase of 1 hr per set weekly if no issues. Pt verbalized understanding.   Current prosthetic weight-bearing tolerance (hours/day)  patient tolerated 8 min of standing / gait with partial weight on prosthesis with no complaints of pain or discomfort including limb   Residual limb condition  no hair growth, dry at  incision site, small (~3-7mm) blister without epidermis on incision site                  [facility] Adult PT Treatment/Exercise - 07/22/15 0930    Prosthetics   Education Provided Skin check;Prosthetic cleaning;Residual limb care;Proper Donning;Proper wear schedule/adjustment   Person(s) Educated Patient   Education Method Explanation;Demonstration;Tactile cues;Verbal cues   Education Method Verbalized understanding;Returned demonstration;Tactile cues required;Verbal cues required;Needs further instruction                PT Education - 07/22/15 1416    Education provided Yes   Education Details Educated on PT POC and prostetic care, residual limb care   Person(s) Educated Patient   Methods Explanation;Demonstration;Tactile cues;Verbal cues   Comprehension Verbalized understanding;Returned demonstration;Verbal cues required;Tactile cues required;Need further  instruction          PT Short Term Goals - 07/22/15 1617    PT SHORT TERM GOAL #1   Title Pt will wear prosthesis >8 hours total /day with no skin issues.   (Target Date: 4th treatment after evaluation)   Baseline Patient is wearing prosthesis 5 of 8 days since delivery up to 2 hrs but developed small blister.    Status New   PT SHORT TERM GOAL #2   Title Patient demonstrates proper donning and verbalizes proper cleaning of prosthesis & residual limb.  (Target Date: 4th treatment after evaluation)   Baseline Patient is dependent in all aspects of prosthetic care.    Status New   PT SHORT TERM GOAL #3   Title Pt will be able to ambulate 300' with supervision and LRAD & prosthesis.  (Target Date: 4th treatment after evaluation)   Baseline Patient ambulates 50' with RW & prosthesis with supervision with gait deviations impairing safety.    Status New   PT SHORT TERM GOAL #4   Title Pt will be able to negogiate ramps, curbs, stairs with supervision and LRAD & prosthesis.  (Target Date: 4th treatment after evaluation)   Baseline Patient is dependent with negotiating barriers with prosthesis.   Status New   PT SHORT TERM GOAL #5   Title Pt will be able to reach 5" in all directions & to floor without UE support with supervision. (Target Date: 4th treatment after evaluation)   Status New           PT Long Term Goals - 07/22/15 1435    PT LONG TERM GOAL #1   Title Pt will be independent with prosthetic and residual limb care to enable safe use of prosthesis (Target Date: 8th treatment after evaluation)   Baseline Pt is dependent with all prosthetic and residual limb care. He has already created small blister on incision line with improper care.   Status New   PT LONG TERM GOAL #2   Title Pt will tolerate wear of prosthesis >90% of all awake hours to enable function throughout his awake hours.  (Target Date: 8th treatment after evaluation)   Baseline Patient has tolerated wear only 5 of  8 days since delivery up to 2hrs.    Status New   PT LONG TERM GOAL #3   Title Pt will be able to ambulate 500' including grass surfaces modified independent with LRAD & prosthesis to enable increased mobility in the community  (Target Date: 8th treatment after evaluation)   Baseline Pt able to walk 40' with walker & prosthesis with deviations limited balance & proper prosthesis use. Gait speed of 0.30ft/s indicates household ambulator &  fall risk.   Status New   PT LONG TERM GOAL #4   Title Pt will be able to negotiate ramps, stairs and curbs modified independent with LRAD & prosthesis to enable safety with community access  (Target Date: 8th treatment after evaluation)   Baseline Pt is currently unable to ambulate on ramps, stairs and curbs with prosthesis & unknowledgeable in proper technique.   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 He has recieved his first prosthesis 07/14/15. PMH: LBP, Chronic renal stage 4, DM2, neuropathy, suicide attempt (april 2016), 1st and 2nd toe removal LLE all add complication to rehab along with prolonged decreased mobility causing deconditioning. He is currently dependent with all prosthetic care and residual limb care including already having a small blister forming on his incision site due to impair wear schedule. He needs skilled instruction to process wear without issues. He is limited in prosthesis wear which limits function during his awake hours and increases stress  & impairs balance moving on one leg.  During eval he demostrates impairments with balance and gait. His inital Berg Balance Score of 14/56 indicates has increased risk of falls, gait speed of 0.67 ft/s indicates he is limited to ambulate around his home with high fall risk. His gait is dependent with deviations & limited distances. Skilled PT is needed for increasing safety with prosthetic wear and mobilty in the home and community.   Rehab Potential Good   PT Frequency 1x / week   PT Duration 8 weeks   PT Treatment/Interventions Passive range of motion;ADLs/Self Care Home Management;Therapeutic exercise;Therapeutic activities;Stair training;Gait training;DME Instruction;Functional mobility training;Balance training;Neuromuscular re-education;Patient/family education;Prosthetic Training;Manual techniques;Scar mobilization   PT Next Visit Plan Prosthetic gait, review prosthetic and residual limb care, initiate HEP for midline   Consulted and Agree with Plan of Care Patient      Patient will benefit from skilled therapeutic intervention in order to improve the following deficits and impairments:  Decreased activity tolerance, Decreased balance, Decreased mobility, Decreased strength, Postural dysfunction, Prosthetic Dependency, Impaired flexibility, Decreased skin integrity, Decreased knowledge of use of DME, Abnormal gait, Decreased endurance  Visit Diagnosis: Unsteadiness on feet  Other abnormalities of gait and mobility  Other symptoms and signs involving the musculoskeletal system     Problem List Patient Active Problem List   Diagnosis Date Noted  . S/P BKA (below knee amputation) unilateral ([place]) 03/05/2015  . Chronic renal insufficiency, stage IV (severe) 03/04/2015  . DM2 (diabetes mellitus, type 2) ([place]) 03/03/2015  . HLD (hyperlipidemia) 03/03/2015  . Back pain 03/03/2015    Brendan Campbell, SPT 07/22/2015, 17:24  Brendan Campbell,  07/23/2015, 12:41 PM  [facility] [address] [place], [place], [address] Phone: [phone]   Fax:  [phone]  Name: Brendan Campbell MRN: [id number] Date of Birth: 1970-06-19

## 2015-08-06 ENCOUNTER — Ambulatory Visit: Payer: Medicaid Other | Admitting: Physical Therapy

## 2015-08-07 ENCOUNTER — Other Ambulatory Visit: Payer: Self-pay | Admitting: *Deleted

## 2015-08-07 MED ORDER — TRAMADOL HCL 50 MG PO TABS: 50.0000 mg | ORAL_TABLET | Freq: Two times a day (BID) | ORAL | Status: DC | PRN

## 2015-08-07 NOTE — Telephone Encounter (Signed)
Last filled 07/06/15, last seen 04/03/15. Route to pool

## 2015-08-07 NOTE — Telephone Encounter (Signed)
Refill called to [facility], tried to call pt's number, which stated unable to reach as dialed.

## 2015-08-18 ENCOUNTER — Encounter: Payer: Self-pay | Admitting: Physical Therapy

## 2015-08-18 ENCOUNTER — Ambulatory Visit: Payer: Medicaid Other | Attending: Student | Admitting: Physical Therapy

## 2015-08-18 DIAGNOSIS — R29898 Other symptoms and signs involving the musculoskeletal system: Secondary | ICD-10-CM | POA: Diagnosis present

## 2015-08-18 DIAGNOSIS — R2689 Other abnormalities of gait and mobility: Secondary | ICD-10-CM | POA: Diagnosis present

## 2015-08-18 DIAGNOSIS — R2681 Unsteadiness on feet: Secondary | ICD-10-CM | POA: Insufficient documentation

## 2015-08-18 NOTE — Therapy (Signed)
[facility] [address] [place], [place], [address] Phone: [phone]   Fax:  [phone]  Physical Therapy Treatment  Patient Details  Name: Brendan Campbell MRN: [id number] Date of Birth: 1970/07/12 Referring Provider: Mechele Claude PA  Encounter Date: 08/18/2015      PT End of Session - 08/18/15 1058    Visit Number 2   Number of Visits 9   Authorization Type Medicaid   Authorization Time Period 08/05/15- 11/24/15   Authorization - Visit Number 1   Authorization - Number of Visits 8   PT Start Time 1015   PT Stop Time 1056   PT Time Calculation (min) 41 min   Equipment Utilized During Treatment Gait belt   Activity Tolerance Patient tolerated treatment well   Behavior During Therapy [facility] for tasks assessed/performed      Past Medical History  Diagnosis Date  . PTSD (post-traumatic stress disorder)   . Diabetes mellitus without complication ([place])   . Neuropathy ([place])   . Anemia   . Anxiety   . Depression   . Blood transfusion without reported diagnosis   . Hyperlipidemia     Past Surgical History  Procedure Laterality Date  . Foot surgery    . Cholecystectomy      There were no vitals filed for this visit.      Subjective Assessment - 08/18/15 1020    Subjective He has been wearing prosthesis daily 4-5 hrs 2x/day. No issues except it "itches"   Pertinent History LBP, Chronic renal stage 4, DM2, neuropathy, suicide attempt (april 2016), neuropathy, 1st and 2nd toe amputation LLE   Limitations Lifting;Standing;Walking   Patient Stated Goals Walking with prosthesis in home & community.   Currently in Pain? No/denies                         [facility] Adult PT Treatment/Exercise - 08/18/15 1015    Transfers   Transfers Sit to Stand;Stand to Sit   Sit to Stand 5: Supervision;With upper extremity assist;From chair/3-in-1;With armrests  requires touch on RW to stabilize, partial wt on  prosthesis   Stand to Sit 5: Supervision;With upper extremity assist;To chair/3-in-1;With armrests  partial wt on prosthesis    Ambulation/Gait   Ambulation/Gait Yes   Ambulation/Gait Assistance 5: Supervision   Ambulation/Gait Assistance Details verbal & visual cues on step thru pattern & step width   Ambulation Distance (Feet) 200 Feet  200' X 2   Assistive device Prosthesis;Rolling walker   Gait Pattern Step-to pattern;Decreased stance time - right;Decreased step length - left;Decreased stride length;Trunk flexed;Trendelenburg;Poor foot clearance - right;Right hip hike;Right circumduction;Decreased weight shift to right;Decreased hip/knee flexion - right;Abducted- right   Ambulation Surface Indoor;Level   Gait velocity --   Stairs Yes   Stairs Assistance 5: Supervision   Stairs Assistance Details (indicate cue type and reason) verbal & visual cues on technique including sequence   Stair Management Technique Two rails;Step to pattern;Forwards   Number of Stairs 4  3 reps   Ramp 5: Supervision  RW & prosthesis   Ramp Details (indicate cue type and reason) demo & verbal cues on technique including posture & wt shift   Curb 5: Supervision  RW & prosthesis   Curb Details (indicate cue type and reason) visual & verbal cues on technique including sequence   Posture/Postural Control   Posture/Postural Control --   Postural Limitations --   Prosthetics   Prosthetic Care Comments  see pt instructions, antiperspirant & baby oil use.   Current prosthetic wear tolerance (days/week)  daily   Current prosthetic wear tolerance (#hours/day)  3-5 hrs 2x/day; PT increased to 6hrs 2x/day   Current prosthetic weight-bearing tolerance (hours/day)  --   Residual limb condition  --   Education Provided Skin check;Prosthetic cleaning;Residual limb care;Proper Donning;Proper wear schedule/adjustment;Correct ply sock adjustment  see pt instructions   Person(s) Educated Patient   Education Method  Explanation;Demonstration;Tactile cues;Verbal cues;Handout   Education Method Verbalized understanding;Returned demonstration;Tactile cues required;Verbal cues required;Needs further instruction                  PT Short Term Goals - 07/22/15 1617    PT SHORT TERM GOAL #1   Title Pt will wear prosthesis >8 hours total /day with no skin issues.   (Target Date: 4th treatment after evaluation)   Baseline Patient is wearing prosthesis 5 of 8 days since delivery up to 2 hrs but developed small blister.    Status New   PT SHORT TERM GOAL #2   Title Patient demonstrates proper donning and verbalizes proper cleaning of prosthesis & residual limb.  (Target Date: 4th treatment after evaluation)   Baseline Patient is dependent in all aspects of prosthetic care.    Status New   PT SHORT TERM GOAL #3   Title Pt will be able to ambulate 300' with supervision and LRAD & prosthesis.  (Target Date: 4th treatment after evaluation)   Baseline Patient ambulates 57' with RW & prosthesis with supervision with gait deviations impairing safety.    Status New   PT SHORT TERM GOAL #4   Title Pt will be able to negogiate ramps, curbs, stairs with supervision and LRAD & prosthesis.  (Target Date: 4th treatment after evaluation)   Baseline Patient is dependent with negotiating barriers with prosthesis.   Status New   PT SHORT TERM GOAL #5   Title Pt will be able to reach 5" in all directions & to floor without UE support with supervision. (Target Date: 4th treatment after evaluation)   Status New           PT Long Term Goals - 07/22/15 1435    PT LONG TERM GOAL #1   Title Pt will be independent with prosthetic and residual limb care to enable safe use of prosthesis (Target Date: 8th treatment after evaluation)   Baseline Pt is dependent with all prosthetic and residual limb care. He has already created small blister on incision line with improper care.   Status New   PT LONG TERM GOAL #2   Title Pt  will tolerate wear of prosthesis >90% of all awake hours to enable function throughout his awake hours.  (Target Date: 8th treatment after evaluation)   Baseline Patient has tolerated wear only 5 of 8 days since delivery up to 2hrs.    Status New   PT LONG TERM GOAL #3   Title Pt will be able to ambulate 500' including grass surfaces modified independent with LRAD & prosthesis to enable increased mobility in the community  (Target Date: 8th treatment after evaluation)   Baseline Pt able to walk 40' with walker & prosthesis with deviations limited balance & proper prosthesis use. Gait speed of 0.70ft/s indicates household ambulator & fall risk.   Status New   PT LONG TERM GOAL #4   Title Pt will be able to negotiate ramps, stairs and curbs modified independent with LRAD & prosthesis to enable safety with  community access  (Target Date: 8th treatment after evaluation)   Baseline Pt is currently unable to ambulate on ramps, stairs and curbs with prosthesis & unknowledgeable in proper technique.   Status New   PT LONG TERM GOAL #5   Title Pt Berg Balance Score will increase to > 30/56 to indicate decreased fall risk  (Target Date: 8th treatment after evaluation)   Baseline Initial Merrilee Jansky Balance is 14/56   Status New   Additional Long Term Goals   Additional Long Term Goals Yes   PT LONG TERM GOAL #6   Title --   Baseline --   Status --               Plan - 08/18/15 1707    Clinical Impression Statement Patient has general understanding of initial prosthetic care instructions. Patient improved gait with RW with skilled instruction including barriers.    Rehab Potential Good   PT Frequency 1x / week   PT Duration 8 weeks   PT Treatment/Interventions Passive range of motion;ADLs/Self Care Home Management;Therapeutic exercise;Therapeutic activities;Stair training;Gait training;DME Instruction;Functional mobility training;Balance training;Neuromuscular re-education;Patient/family  education;Prosthetic Training;Manual techniques;Scar mobilization   PT Next Visit Plan Prosthetic gait, review prosthetic and residual limb care, initiate HEP for midline   Consulted and Agree with Plan of Care Patient      Patient will benefit from skilled therapeutic intervention in order to improve the following deficits and impairments:  Decreased activity tolerance, Decreased balance, Decreased mobility, Decreased strength, Postural dysfunction, Prosthetic Dependency, Impaired flexibility, Decreased skin integrity, Decreased knowledge of use of DME, Abnormal gait, Decreased endurance  Visit Diagnosis: Unsteadiness on feet  Other abnormalities of gait and mobility  Other symptoms and signs involving the musculoskeletal system     Problem List Patient Active Problem List   Diagnosis Date Noted  . S/P BKA (below knee amputation) unilateral ([place]) 03/05/2015  . Chronic renal insufficiency, stage IV (severe) 03/04/2015  . DM2 (diabetes mellitus, type 2) ([place]) 03/03/2015  . HLD (hyperlipidemia) 03/03/2015  . Back pain 03/03/2015    Jamey Reas PT, DPT 08/18/2015, 5:11 PM  [facility] [address] [place], [place], [address] Phone: [phone]   Fax:  [phone]  Name: Brendan Campbell MRN: [id number] Date of Birth: 03-05-1970

## 2015-08-18 NOTE — Patient Instructions (Signed)
Use Secret or Degree Clinical Strength antiperspirant on limb below your knee before putting prosthesis on. Roll liner on limb until just below knee. Apply thin layer of baby oil or mineral oil over knee and above knee as high as liner goes.  When removing prosthesis use a wet, warm wash cloth to wipe limb dry. If you don't have a wet cloth, use dry prosthetic sock to wipe your leg instead of scratching it.   Wear prosthesis from getting out of bed for 5-6 hrs. Remove prosthesis for 1 hr and use the stretchy shrinker sock.  Put prosthesis back on your leg after the 1-2 hour break and wear until bedtime except when bathing.   When walking with walker, try to keep your feet on their side of walker using the blue buttons on front of walker. On stairs & curbs go up with "good" (your left) leg first and bend knee to bring prosthesis up. Go down with "bad" (prosthesis) first. Same on curbs. On ramps or inclines, stand up straight and take shorter steps so you can get weight onto prosthesis. Go up with more pressure forward on toes and down with more pressure on heels.   Picking up things off floor: Place prosthesis forward so when you bend your knees & hips the prosthetic foot stays flat of floor. When you first start to practice this, have a chair behind you in case you need to sit down and a walker or table/counter in front in case you loose your balance. Pick up a light weight object like a balled up piece of paper 10 times.  Stand up & sit down 10 times trying to use only the bottom of the chair. Have the walker in front but try not to touch it unless you are off balance.

## 2015-08-26 ENCOUNTER — Encounter: Payer: 59 | Admitting: Physical Therapy

## 2015-09-02 ENCOUNTER — Ambulatory Visit: Payer: Medicaid Other | Admitting: Physical Therapy

## 2015-09-02 ENCOUNTER — Encounter: Payer: Self-pay | Admitting: Physical Therapy

## 2015-09-02 DIAGNOSIS — R2681 Unsteadiness on feet: Secondary | ICD-10-CM

## 2015-09-02 DIAGNOSIS — R29898 Other symptoms and signs involving the musculoskeletal system: Secondary | ICD-10-CM

## 2015-09-02 DIAGNOSIS — R2689 Other abnormalities of gait and mobility: Secondary | ICD-10-CM

## 2015-09-02 NOTE — Therapy (Signed)
[facility] [address] [place], [place], [address] Phone: [phone]   Fax:  [phone]  Physical Therapy Treatment  Patient Details  Name: Brendan Campbell MRN: [id number] Date of Birth: [birth year] Referring Provider: Mechele Claude PA  Encounter Date: 09/02/2015      PT End of Session - 09/02/15 2130    Visit Number 3   Number of Visits 9   Authorization Type Medicaid   Authorization Time Period 08/05/15- 11/24/15   Authorization - Visit Number 2   Authorization - Number of Visits 8   PT Start Time 1103   PT Stop Time 1145   PT Time Calculation (min) 42 min   Equipment Utilized During Treatment Gait belt   Activity Tolerance Patient tolerated treatment well   Behavior During Therapy WFL for tasks assessed/performed      Past Medical History:  Diagnosis Date  . Anemia   . Anxiety   . Blood transfusion without reported diagnosis   . Depression   . Diabetes mellitus without complication ([employer])   . Hyperlipidemia   . Neuropathy ([place])   . PTSD (post-traumatic stress disorder)     Past Surgical History:  Procedure Laterality Date  . CHOLECYSTECTOMY    . FOOT SURGERY      There were no vitals filed for this visit.      Subjective Assessment - 09/02/15 1111    Subjective He has been wearing prosthesis 6-8 hrs straight without issues. He did not wear shrinker for 2 days and was so swollen he couldn't get into prosthesis.    Pertinent History LBP, Chronic renal stage 4, DM2, neuropathy, suicide attempt (april 2016), neuropathy, 1st and 2nd toe amputation LLE   Limitations Lifting;Standing;Walking   Patient Stated Goals Walking with prosthesis in home & community.   Currently in Pain? No/denies                         [facility] Adult PT Treatment/Exercise - 09/02/15 1100      Transfers   Transfers Sit to Stand;Stand to Sit   Sit to Stand 5: Supervision;With upper extremity assist;From  chair/3-in-1;With armrests  worked on not touching for stabilization   Sit to Stand Details (indicate cue type and reason) cues on stabilizing without touching   Stand to Sit 5: Supervision;With upper extremity assist;To chair/3-in-1;With armrests   Stand to Sit Details cues on technique to control descent     Ambulation/Gait   Ambulation/Gait Yes   Ambulation/Gait Assistance 4: Min assist   Ambulation/Gait Assistance Details verbal & tactile cues on sequence & posture with single point cane.   Ambulation Distance (Feet) 150 Feet  150' X 2   Assistive device Prosthesis;Straight cane;Rolling walker   Gait Pattern Decreased stance time - right;Decreased step length - left;Decreased stride length;Trunk flexed;Trendelenburg;Poor foot clearance - right;Right hip hike;Right circumduction;Decreased weight shift to right;Decreased hip/knee flexion - right;Abducted- right;Step-through pattern   Ambulation Surface Indoor;Level   Stairs Yes   Stairs Assistance 5: Supervision   Stairs Assistance Details (indicate cue type and reason) verbal cues on sequence and weight shift with cane & prosthesis   Stair Management Technique Step to pattern;Forwards;One rail Left;With cane   Number of Stairs 4  3 reps   Ramp 5: Supervision  RW & prosthesis   Curb 5: Supervision  RW & prosthesis     High Level Balance   High Level Balance Activities Negotitating around obstacles     Self-Care  Self-Care Lifting   Lifting PT demo picking up objects from floor utilizing prosthesis. Pt return demo understanding      Neuro Re-ed    Neuro Re-ed Details  Balance exercises in corner for safety with RW with intermittent UE support: head turns right /left, up/down and diagonals.      Prosthetics   Prosthetic Care Comments  fall risk when not wearing prosthesis   Current prosthetic wear tolerance (days/week)  daily   Current prosthetic wear tolerance (#hours/day)  increase to all awake hours except 1 hour mid-day.     Residual limb condition  no open areas   Education Provided Skin check;Prosthetic cleaning;Residual limb care;Proper Donning;Proper wear schedule/adjustment;Correct ply sock adjustment  see pt care instructions   Person(s) Educated Patient   Education Method Explanation;Demonstration;Tactile cues;Verbal cues   Education Method Verbalized understanding;Returned demonstration;Tactile cues required;Verbal cues required;Needs further instruction                  PT Short Term Goals - 07/22/15 1617      PT SHORT TERM GOAL #1   Title Pt will wear prosthesis >8 hours total /day with no skin issues.   (Target Date: 4th treatment after evaluation)   Baseline Patient is wearing prosthesis 5 of 8 days since delivery up to 2 hrs but developed small blister.    Status New     PT SHORT TERM GOAL #2   Title Patient demonstrates proper donning and verbalizes proper cleaning of prosthesis & residual limb.  (Target Date: 4th treatment after evaluation)   Baseline Patient is dependent in all aspects of prosthetic care.    Status New     PT SHORT TERM GOAL #3   Title Pt will be able to ambulate 300' with supervision and LRAD & prosthesis.  (Target Date: 4th treatment after evaluation)   Baseline Patient ambulates 31' with RW & prosthesis with supervision with gait deviations impairing safety.    Status New     PT SHORT TERM GOAL #4   Title Pt will be able to negogiate ramps, curbs, stairs with supervision and LRAD & prosthesis.  (Target Date: 4th treatment after evaluation)   Baseline Patient is dependent with negotiating barriers with prosthesis.   Status New     PT SHORT TERM GOAL #5   Title Pt will be able to reach 5" in all directions & to floor without UE support with supervision. (Target Date: 4th treatment after evaluation)   Status New           PT Long Term Goals - 07/22/15 1435      PT LONG TERM GOAL #1   Title Pt will be independent with prosthetic and residual limb  care to enable safe use of prosthesis (Target Date: 8th treatment after evaluation)   Baseline Pt is dependent with all prosthetic and residual limb care. He has already created small blister on incision line with improper care.   Status New     PT LONG TERM GOAL #2   Title Pt will tolerate wear of prosthesis >90% of all awake hours to enable function throughout his awake hours.  (Target Date: 8th treatment after evaluation)   Baseline Patient has tolerated wear only 5 of 8 days since delivery up to 2hrs.    Status New     PT LONG TERM GOAL #3   Title Pt will be able to ambulate 500' including grass surfaces modified independent with LRAD & prosthesis to enable increased mobility in  the community  (Target Date: 8th treatment after evaluation)   Baseline Pt able to walk 10' with walker & prosthesis with deviations limited balance & proper prosthesis use. Gait speed of 0.75ft/s indicates household ambulator & fall risk.   Status New     PT LONG TERM GOAL #4   Title Pt will be able to negotiate ramps, stairs and curbs modified independent with LRAD & prosthesis to enable safety with community access  (Target Date: 8th treatment after evaluation)   Baseline Pt is currently unable to ambulate on ramps, stairs and curbs with prosthesis & unknowledgeable in proper technique.   Status New     PT LONG TERM GOAL #5   Title Pt Berg Balance Score will increase to > 30/56 to indicate decreased fall risk  (Target Date: 8th treatment after evaluation)   Baseline Initial Merrilee Jansky Balance is 14/56   Status New     Additional Long Term Goals   Additional Long Term Goals Yes     PT LONG TERM GOAL #6   Title --   Baseline --   Status --               Plan - 09/02/15 2132    Clinical Impression Statement Patient is tolerating increased wear without skin issues. He improved ability to pick up objects from floor with instruction. Patient has balance issues with head turns to scan environment.     Rehab Potential Good   PT Frequency 1x / week   PT Duration 8 weeks   PT Treatment/Interventions Passive range of motion;ADLs/Self Care Home Management;Therapeutic exercise;Therapeutic activities;Stair training;Gait training;DME Instruction;Functional mobility training;Balance training;Neuromuscular re-education;Patient/family education;Prosthetic Training;Manual techniques;Scar mobilization   PT Next Visit Plan Prosthetic gait, review prosthetic and residual limb care, initiate HEP for midline   Consulted and Agree with Plan of Care Patient      Patient will benefit from skilled therapeutic intervention in order to improve the following deficits and impairments:  Decreased activity tolerance, Decreased balance, Decreased mobility, Decreased strength, Postural dysfunction, Prosthetic Dependency, Impaired flexibility, Decreased skin integrity, Decreased knowledge of use of DME, Abnormal gait, Decreased endurance  Visit Diagnosis: Unsteadiness on feet  Other abnormalities of gait and mobility  Other symptoms and signs involving the musculoskeletal system     Problem List Patient Active Problem List   Diagnosis Date Noted  . S/P BKA (below knee amputation) unilateral ([place]) 03/05/2015  . Chronic renal insufficiency, stage IV (severe) 03/04/2015  . DM2 (diabetes mellitus, type 2) ([place]) 03/03/2015  . HLD (hyperlipidemia) 03/03/2015  . Back pain 03/03/2015    Jamey Reas PT, DPT 09/02/2015, 9:36 PM  [facility] [address] [place], [place], [address] Phone: [phone]   Fax:  [phone]  Name: Brendan Campbell MRN: [id number] Date of Birth: Aug 08, 1970

## 2015-09-04 ENCOUNTER — Other Ambulatory Visit: Payer: Self-pay | Admitting: Family Medicine

## 2015-09-04 NOTE — Telephone Encounter (Signed)
Patient advised he will need to be seen

## 2015-09-07 ENCOUNTER — Ambulatory Visit (INDEPENDENT_AMBULATORY_CARE_PROVIDER_SITE_OTHER): Payer: Medicaid Other | Admitting: Pediatrics

## 2015-09-07 ENCOUNTER — Telehealth: Payer: Self-pay | Admitting: Family Medicine

## 2015-09-07 ENCOUNTER — Encounter: Payer: Self-pay | Admitting: Pediatrics

## 2015-09-07 VITALS — BP 105/75 | HR 95 | Temp 97.8°F | Ht 72.0 in | Wt 239.2 lb

## 2015-09-07 DIAGNOSIS — N184 Chronic kidney disease, stage 4 (severe): Secondary | ICD-10-CM

## 2015-09-07 DIAGNOSIS — N189 Chronic kidney disease, unspecified: Secondary | ICD-10-CM | POA: Diagnosis not present

## 2015-09-07 DIAGNOSIS — J069 Acute upper respiratory infection, unspecified: Secondary | ICD-10-CM

## 2015-09-07 NOTE — Telephone Encounter (Signed)
Patient advised that he will have to wait and see his PCP next week for his pain medication. Patient also given an appointment tonight at 5:15 for cough.

## 2015-09-07 NOTE — Patient Instructions (Signed)
Tylenol as needed neti pot twice a day for sinus congestion Use flonase two sprays each nostril daily Cetirizine or other allergy medicine daily Let me know if you have any fevers or if not starting to feel better end of the weekend (Aug6-7)

## 2015-09-07 NOTE — Progress Notes (Signed)
    Subjective:    Patient ID: Brendan Campbell, male    DOB: 05-03-1970, 45 y.o.   MRN: [id number]  CC: Sinusitis; Cough; and Chest congestion   HPI: Brendan Campbell is a 45 y.o. male presenting for Sinusitis; Cough; and Chest congestion  Watery eyes, congestion started 3-4 days ago Taking some benadryl, helps him sleep Took some cold and flu tbs, helped some Subjective fever a couple of days ago Lots of runny nose   Depression screen [facility] 2/9 09/07/2015 04/03/2015 03/03/2015  Decreased Interest 0 1 0  Down, Depressed, Hopeless 0 1 0  PHQ - 2 Score 0 2 0  Altered sleeping - 1 -  Tired, decreased energy - 0 -  Change in appetite - 1 -  Feeling bad or failure about yourself  - 1 -  Trouble concentrating - 0 -  Moving slowly or fidgety/restless - 0 -  Suicidal thoughts - 0 -  PHQ-9 Score - 5 -     Relevant past medical, surgical, family and social history reviewed and updated. Interim medical history since our last visit reviewed. Allergies and medications reviewed and updated.  History  Smoking Status  . Never Smoker  Smokeless Tobacco  . Never Used    ROS: Per HPI      Objective:    BP 105/75 (BP Location: Left Arm, Patient Position: Sitting, Cuff Size: Normal)   Pulse 95   Temp 97.8 F (36.6 C) (Oral)   Ht 6' (1.829 m)   Wt 239 lb 3.2 oz (108.5 kg)   SpO2 100%   BMI 32.44 kg/m   Wt Readings from Last 3 Encounters:  09/07/15 239 lb 3.2 oz (108.5 kg)  11/18/13 215 lb (97.5 kg)     Gen: NAD, alert, cooperative with exam, NCAT EYES: EOMI, no scleral injection or icterus ENT:  TMs dull gray b/l, OP without erythema LYMPH: no cervical LAD CV: NRRR, normal S1/S2, no murmur, distal pulses 2+ b/l Resp: CTABL, no wheezes, normal WOB Abd: +BS, soft, NTND. no guarding or organomegaly Ext: No edema, warm Neuro: Alert and oriented     Assessment & Plan:    Brendan Campbell was seen today for sinusitis, cough and chest congestion.  Diagnoses and all orders for this  visit:  Acute URI Discussed symptomatic care Let me know if any worsening  Chronic renal insufficiency, stage IV (severe) Avoid NSAIDs BP normal  Patient Instructions   Tylenol as needed neti pot twice a day for sinus congestion Use flonase two sprays each nostril daily Cetirizine or other allergy medicine daily Let me know if you have any fevers or if not starting to feel better end of the weekend (Aug6-7)  Follow up plan: As needed  Assunta Found, MD [facility] 09/07/2015, 6:11 PM

## 2015-09-09 ENCOUNTER — Ambulatory Visit: Payer: Medicaid Other | Attending: Student | Admitting: Physical Therapy

## 2015-09-09 DIAGNOSIS — R29898 Other symptoms and signs involving the musculoskeletal system: Secondary | ICD-10-CM | POA: Insufficient documentation

## 2015-09-09 DIAGNOSIS — R2689 Other abnormalities of gait and mobility: Secondary | ICD-10-CM | POA: Insufficient documentation

## 2015-09-09 DIAGNOSIS — R2681 Unsteadiness on feet: Secondary | ICD-10-CM | POA: Insufficient documentation

## 2015-09-15 ENCOUNTER — Ambulatory Visit (INDEPENDENT_AMBULATORY_CARE_PROVIDER_SITE_OTHER): Payer: Medicaid Other | Admitting: Family Medicine

## 2015-09-15 ENCOUNTER — Encounter: Payer: Self-pay | Admitting: Family Medicine

## 2015-09-15 VITALS — BP 137/89 | HR 92 | Temp 97.3°F | Ht 73.83 in | Wt 240.2 lb

## 2015-09-15 DIAGNOSIS — N189 Chronic kidney disease, unspecified: Secondary | ICD-10-CM | POA: Diagnosis not present

## 2015-09-15 DIAGNOSIS — M5441 Lumbago with sciatica, right side: Secondary | ICD-10-CM

## 2015-09-15 DIAGNOSIS — Z794 Long term (current) use of insulin: Secondary | ICD-10-CM

## 2015-09-15 DIAGNOSIS — M5442 Lumbago with sciatica, left side: Secondary | ICD-10-CM | POA: Diagnosis not present

## 2015-09-15 DIAGNOSIS — L97509 Non-pressure chronic ulcer of other part of unspecified foot with unspecified severity: Secondary | ICD-10-CM | POA: Diagnosis not present

## 2015-09-15 DIAGNOSIS — E11621 Type 2 diabetes mellitus with foot ulcer: Secondary | ICD-10-CM | POA: Diagnosis not present

## 2015-09-15 DIAGNOSIS — N184 Chronic kidney disease, stage 4 (severe): Secondary | ICD-10-CM

## 2015-09-15 DIAGNOSIS — E785 Hyperlipidemia, unspecified: Secondary | ICD-10-CM | POA: Diagnosis not present

## 2015-09-15 LAB — BAYER DCA HB A1C WAIVED: HB A1C (BAYER DCA - WAIVED): 6.5 % (ref <7.0)

## 2015-09-15 MED ORDER — TRAMADOL HCL 50 MG PO TABS: 50.0000 mg | ORAL_TABLET | Freq: Two times a day (BID) | ORAL | 2 refills | Status: DC | PRN

## 2015-09-15 NOTE — Patient Instructions (Signed)
Great to see you!  Lets see you back in 3 months unless you need [place] sooner.   We will call with lab results within 1 week

## 2015-09-15 NOTE — Progress Notes (Signed)
   HPI  Patient presents today here to follow for type 2 diabetes, chronic back pain, and hyperlipidemia.  Type 2 diabetes Fasting blood sugars 80-130 He's taking 15 units of Lantus daily, he is not having any hypoglycemia. He has had one episode when he first began Lantus, he started with 25 units. His blood sugar was measured to be in the 40s and he had symptoms perceived. Left foot numbness   Pain Help by tramadol 2 daily. Chronic back pain, states that tramadol helps his functional status quite a bit comments difficult to move around and walk without tramadol. He denies any history of back  Surgery.  Renal disease Following closely with nephrology, he explains that he is holding closely to his low carb renal diet  HLD - good med compliance   PMH: Smoking status noted ROS: Per HPI  Objective: BP 137/89 (BP Location: Right Arm, Patient Position: Sitting, Cuff Size: Large)   Pulse 92   Temp 97.3 F (36.3 C) (Oral)   Ht 6' 1.83" (1.875 m)   Wt 240 lb 3.2 oz (109 kg)   BMI 30.98 kg/m  Gen: NAD, alert, cooperative with exam HEENT: NCAT Ext: No edema, warm Neuro: Alert and oriented, and using a walker, now admitted with a right lower extremity prosthesis.  Diabetic Foot Exam - Simple   Simple Foot Form Visual Inspection See comments:  Yes Sensation Testing See comments:  Yes Pulse Check See comments:  Yes Comments R sided AKA, L foot with amputation of first and second toes, No sensation with monfilament of entire L foot to level of ankle.  Callus and collpase of arch on L, no wounds      Assessment and plan:  # T2DM Controlled likely improving. Diabetic neuropathy as well Continue Lantus Ophthalmology ordered today  # Back pain Doing well with tramadol, refilled  # Renal disease, stage IV kidney disease Following with nephrology, repeating labs, encouraged continued compliance of renal diet and listening to nephrology's advice about AV fistula  #  HLD Recheck labs, continue with     Orders Placed This Encounter  Procedures  . Bayer DCA Hb A1c Waived  . CBC with Differential/Platelet  . CMP14+EGFR  . Lipid panel  . Ambulatory referral to Ophthalmology    Referral Priority:   Routine    Referral Type:   Consultation    Referral Reason:   Specialty Services Required    Requested Specialty:   Ophthalmology    Number of Visits Requested:   1    Meds ordered this encounter  Medications  . traMADol (ULTRAM) 50 MG tablet    Sig: Take 1 tablet (50 mg total) by mouth every 12 (twelve) hours as needed.    Dispense:  60 tablet    Refill:  Mitchell, MD [facility] 09/15/2015, 8:23 AM

## 2015-09-16 ENCOUNTER — Telehealth: Payer: Self-pay | Admitting: Family Medicine

## 2015-09-16 ENCOUNTER — Ambulatory Visit: Payer: Medicaid Other | Admitting: Physical Therapy

## 2015-09-16 LAB — CMP14+EGFR
A/G RATIO: 1.5 (ref 1.2–2.2)
ALK PHOS: 104 IU/L (ref 39–117)
ALT: 9 IU/L (ref 0–44)
AST: 14 IU/L (ref 0–40)
Albumin: 3.4 g/dL — ABNORMAL LOW (ref 3.5–5.5)
BUN/Creatinine Ratio: 6 — ABNORMAL LOW (ref 9–20)
BUN: 35 mg/dL — ABNORMAL HIGH (ref 6–24)
Bilirubin Total: <0.2 mg/dL (ref 0.0–1.2)
CHLORIDE: 108 mmol/L — AB (ref 96–106)
CO2: 18 mmol/L (ref 18–29)
Calcium: 8.8 mg/dL (ref 8.7–10.2)
Creatinine, Ser: 5.4 mg/dL (ref 0.76–1.27)
GFR calc Af Amer: 14 mL/min/{1.73_m2} — ABNORMAL LOW (ref >59)
GFR calc non Af Amer: 12 mL/min/{1.73_m2} — ABNORMAL LOW (ref >59)
Globulin, Total: 2.3 g/dL (ref 1.5–4.5)
Glucose: 130 mg/dL — ABNORMAL HIGH (ref 65–99)
POTASSIUM: 6.3 mmol/L — AB (ref 3.5–5.2)
SODIUM: 142 mmol/L (ref 134–144)
Total Protein: 5.7 g/dL — ABNORMAL LOW (ref 6.0–8.5)

## 2015-09-16 LAB — CBC WITH DIFFERENTIAL/PLATELET
BASOS: 1 %
Basophils Absolute: 0.1 10*3/uL (ref 0.0–0.2)
EOS (ABSOLUTE): 0.7 10*3/uL — AB (ref 0.0–0.4)
Eos: 7 %
Hematocrit: 31.9 % — ABNORMAL LOW (ref 37.5–51.0)
Hemoglobin: 9.8 g/dL — ABNORMAL LOW (ref 12.6–17.7)
Immature Grans (Abs): 0 10*3/uL (ref 0.0–0.1)
Immature Granulocytes: 0 %
LYMPHS ABS: 3.6 10*3/uL — AB (ref 0.7–3.1)
Lymphs: 35 %
MCH: 28.7 pg (ref 26.6–33.0)
MCHC: 30.7 g/dL — AB (ref 31.5–35.7)
MCV: 93 fL (ref 79–97)
MONOS ABS: 0.5 10*3/uL (ref 0.1–0.9)
Monocytes: 5 %
Neutrophils Absolute: 5.5 10*3/uL (ref 1.4–7.0)
Neutrophils: 52 %
Platelets: 397 10*3/uL — ABNORMAL HIGH (ref 150–379)
RBC: 3.42 x10E6/uL — ABNORMAL LOW (ref 4.14–5.80)
RDW: 14.5 % (ref 12.3–15.4)
WBC: 10.4 10*3/uL (ref 3.4–10.8)

## 2015-09-16 LAB — LIPID PANEL
CHOLESTEROL TOTAL: 238 mg/dL — AB (ref 100–199)
Chol/HDL Ratio: 6.6 ratio units — ABNORMAL HIGH (ref 0.0–5.0)
HDL: 36 mg/dL — AB (ref >39)
LDL Calculated: 149 mg/dL — ABNORMAL HIGH (ref 0–99)
Triglycerides: 264 mg/dL — ABNORMAL HIGH (ref 0–149)
VLDL Cholesterol Cal: 53 mg/dL — ABNORMAL HIGH (ref 5–40)

## 2015-09-16 NOTE — Telephone Encounter (Signed)
Called pt again to again advise him to go to the ED. He still declines to go.

## 2015-09-16 NOTE — Telephone Encounter (Signed)
Called patient to tell him about his critical labs and advised patent per Dr.Miller that he needed to go to the ER due to his high potasium and kidney function declining. I told patient how important this was that it was at a dangerous level and patient states "he knows the consciences and refused to go to the hospital'. Made several attempts to convince him to go but patient continues to refuse to go.

## 2015-09-17 MED ORDER — SODIUM POLYSTYRENE SULFONATE 15 GM/60ML PO SUSP: 15.0000 g | Freq: Three times a day (TID) | ORAL | 0 refills | Status: DC

## 2015-09-17 NOTE — Telephone Encounter (Signed)
Lets ask him to call to see his nephrologist this week if possible.   PLease fax labs to Dr. Charlett Lango.   In the meantime lets start kayexelate, I will send this to the pharmacy. This will hopefully lower his potassium.   I agree he needs attention very soon, at least repeat stat labs (BMP) today   Laroy Apple, MD [facility] 09/17/2015, 7:49 AM

## 2015-09-23 ENCOUNTER — Encounter: Payer: Self-pay | Admitting: Physical Therapy

## 2015-09-23 ENCOUNTER — Ambulatory Visit: Payer: Medicaid Other | Admitting: Physical Therapy

## 2015-09-23 DIAGNOSIS — R29898 Other symptoms and signs involving the musculoskeletal system: Secondary | ICD-10-CM | POA: Diagnosis present

## 2015-09-23 DIAGNOSIS — R2689 Other abnormalities of gait and mobility: Secondary | ICD-10-CM

## 2015-09-23 DIAGNOSIS — R2681 Unsteadiness on feet: Secondary | ICD-10-CM | POA: Diagnosis not present

## 2015-09-24 NOTE — Therapy (Signed)
[facility] [address] [place], [place], [address] Phone: [phone]   Fax:  [phone]  Physical Therapy Treatment  Patient Details  Name: Brendan Campbell MRN: [id number] Date of Birth: 02/20/1970 Referring Provider: Mechele Claude PA  Encounter Date: 09/23/2015      PT End of Session - 09/23/15 1232    Visit Number 4   Number of Visits 9   Authorization Type Medicaid   Authorization Time Period 08/05/15- 11/24/15   Authorization - Visit Number 3   Authorization - Number of Visits 8   PT Start Time [id number]   PT Stop Time 1120   PT Time Calculation (min) 40 min   Equipment Utilized During Treatment Gait belt   Activity Tolerance Patient tolerated treatment well   Behavior During Therapy [facility] for tasks assessed/performed      Past Medical History:  Diagnosis Date  . Anemia   . Anxiety   . Blood transfusion without reported diagnosis   . Depression   . Diabetes mellitus without complication ([place])   . Hyperlipidemia   . Neuropathy ([place])   . PTSD (post-traumatic stress disorder)     Past Surgical History:  Procedure Laterality Date  . CHOLECYSTECTOMY    . FOOT SURGERY      There were no vitals filed for this visit.      Subjective Assessment - 09/23/15 1043    Subjective He is aware of issues with his kidneys but does not want to do dialysis.    Pertinent History LBP, Chronic renal stage 4, DM2, neuropathy, suicide attempt (april 2016), neuropathy, 1st and 2nd toe amputation LLE   Limitations Lifting;Standing;Walking   Patient Stated Goals Walking with prosthesis in home & community.   Currently in Pain? No/denies                         [facility] Adult PT Treatment/Exercise - 09/23/15 1040      Transfers   Transfers Sit to Stand;Stand to Sit   Sit to Stand 5: Supervision;With upper extremity assist;From chair/3-in-1;With armrests  worked on not touching for stabilization   Stand to Sit  5: Supervision;With upper extremity assist;To chair/3-in-1;With armrests     Ambulation/Gait   Ambulation/Gait Yes   Ambulation/Gait Assistance 4: Min guard;5: Supervision   Ambulation/Gait Assistance Details tactile, verbal & visual cues on proper step width, posture, wt shift, maintaining path while scanning, changing directions with prosthesis   Ambulation Distance (Feet) 150 Feet  150' X 3   Assistive device Prosthesis;Straight cane   Gait Pattern Decreased stance time - right;Decreased step length - left;Decreased stride length;Trunk flexed;Trendelenburg;Poor foot clearance - right;Right hip hike;Right circumduction;Decreased weight shift to right;Decreased hip/knee flexion - right;Abducted- right;Step-through pattern   Ambulation Surface Indoor;Level   Stairs Yes   Stairs Assistance 5: Supervision   Stairs Assistance Details (indicate cue type and reason) verbal & visual cues on proper step width and weight shift   Stair Management Technique Forwards;Two rails;Alternating pattern   Number of Stairs 4  3 reps   Ramp 4: Min assist  prosthesis & cane   Ramp Details (indicate cue type and reason) verbal cues on posture & weight shift   Curb 4: Min assist  cane & prosthesis   Curb Details (indicate cue type and reason) verbal cues on step length & weight shift     High Level Balance   High Level Balance Activities Negotitating around obstacles;Side stepping;Backward walking;Turns;Head turns;Marching forwards;Negotiating over  obstacles;Weight-shifting turns;Figure 8 turns   High Level Balance Comments visual, verbal & tactile cues on technique with prosthesis     Self-Care   Self-Care Lifting   Lifting PT demo picking up objects from floor utilizing prosthesis. Pt return demo understanding      Neuro Re-ed    Neuro Re-ed Details  --     Prosthetics   Prosthetic Care Comments  changing ply socks with edema changes; fall risk when not wearing prosthesis   Current prosthetic wear  tolerance (days/week)  daily   Current prosthetic wear tolerance (#hours/day)  increase to all awake hours drying q 4hrs & prn   Residual limb condition  no open areas except one superficial scratch proximal to knee   Education Provided Prosthetic cleaning;Residual limb care;Proper wear schedule/adjustment;Correct ply sock adjustment  see pt care instructions   Person(s) Educated Patient   Education Method Explanation;Verbal cues   Education Method Verbalized understanding;Verbal cues required;Needs further instruction                  PT Short Term Goals - 09/23/15 1233      PT SHORT TERM GOAL #1   Title Pt will wear prosthesis >8 hours total /day with no skin issues.   (Target Date: 4th treatment after evaluation)   Baseline Patient is wearing prosthesis 5 of 8 days since delivery up to 2 hrs but developed small blister.    Status On-going     PT SHORT TERM GOAL #2   Title Patient demonstrates proper donning and verbalizes proper cleaning of prosthesis & residual limb.  (Target Date: 4th treatment after evaluation)   Baseline Patient is dependent in all aspects of prosthetic care.    Status On-going     PT SHORT TERM GOAL #3   Title Pt will be able to ambulate 300' with supervision and LRAD & prosthesis.  (Target Date: 4th treatment after evaluation)   Baseline Patient ambulates 4' with RW & prosthesis with supervision with gait deviations impairing safety.    Status On-going     PT SHORT TERM GOAL #4   Title Pt will be able to negogiate ramps, curbs, stairs with supervision and LRAD & prosthesis.  (Target Date: 4th treatment after evaluation)   Baseline Patient is dependent with negotiating barriers with prosthesis.   Status On-going     PT SHORT TERM GOAL #5   Title Pt will be able to reach 5" in all directions & to floor without UE support with supervision. (Target Date: 4th treatment after evaluation)   Status On-going           PT Long Term Goals - 09/23/15  1233      PT LONG TERM GOAL #1   Title Pt will be independent with prosthetic and residual limb care to enable safe use of prosthesis (Target Date: 8th treatment after evaluation)   Baseline Pt is dependent with all prosthetic and residual limb care. He has already created small blister on incision line with improper care.   Status On-going     PT LONG TERM GOAL #2   Title Pt will tolerate wear of prosthesis >90% of all awake hours to enable function throughout his awake hours.  (Target Date: 8th treatment after evaluation)   Baseline Patient has tolerated wear only 5 of 8 days since delivery up to 2hrs.    Status On-going     PT LONG TERM GOAL #3   Title Pt will be able to ambulate 500'  including grass surfaces modified independent with LRAD & prosthesis to enable increased mobility in the community  (Target Date: 8th treatment after evaluation)   Baseline Pt able to walk 40' with walker & prosthesis with deviations limited balance & proper prosthesis use. Gait speed of 0.81ft/s indicates household ambulator & fall risk.   Status On-going     PT LONG TERM GOAL #4   Title Pt will be able to negotiate ramps, stairs and curbs modified independent with LRAD & prosthesis to enable safety with community access  (Target Date: 8th treatment after evaluation)   Baseline Pt is currently unable to ambulate on ramps, stairs and curbs with prosthesis & unknowledgeable in proper technique.   Status On-going     PT LONG TERM GOAL #5   Title Pt Berg Balance Score will increase to > 30/56 to indicate decreased fall risk  (Target Date: 8th treatment after evaluation)   Baseline Initial Merrilee Jansky Balance is 14/56   Status On-going               Plan - 09/23/15 1234    Clinical Impression Statement Patient had less energy today probably due to issues with kidneys and he required more frequent rests today. Patient improved balance with negotiating obstacles & changing directions with skilled instruction  and practice.    Rehab Potential Good   PT Frequency 1x / week   PT Duration 8 weeks   PT Treatment/Interventions Passive range of motion;ADLs/Self Care Home Management;Therapeutic exercise;Therapeutic activities;Stair training;Gait training;DME Instruction;Functional mobility training;Balance training;Neuromuscular re-education;Patient/family education;Prosthetic Training;Manual techniques;Scar mobilization   PT Next Visit Plan Assess STGs, Prosthetic gait, review prosthetic and residual limb care, initiate HEP for midline   Consulted and Agree with Plan of Care Patient      Patient will benefit from skilled therapeutic intervention in order to improve the following deficits and impairments:  Decreased activity tolerance, Decreased balance, Decreased mobility, Decreased strength, Postural dysfunction, Prosthetic Dependency, Impaired flexibility, Decreased skin integrity, Decreased knowledge of use of DME, Abnormal gait, Decreased endurance  Visit Diagnosis: Unsteadiness on feet  Other abnormalities of gait and mobility  Other symptoms and signs involving the musculoskeletal system     Problem List Patient Active Problem List   Diagnosis Date Noted  . S/P BKA (below knee amputation) unilateral ([place]) 03/05/2015  . Chronic renal insufficiency, stage IV (severe) 03/04/2015  . DM2 (diabetes mellitus, type 2) ([place]) 03/03/2015  . HLD (hyperlipidemia) 03/03/2015  . Back pain 03/03/2015    Keison Glendinning PT, DPT 09/24/2015, 12:47 PM  [facility] [address] [place], [place], [address] Phone: [phone]   Fax:  [phone]  Name: Brendan Campbell MRN: [id number] Date of Birth: 1970-09-02

## 2015-09-30 ENCOUNTER — Ambulatory Visit: Payer: Medicaid Other | Admitting: Physical Therapy

## 2015-10-09 ENCOUNTER — Telehealth: Payer: Self-pay | Admitting: Family Medicine

## 2015-10-22 NOTE — Telephone Encounter (Signed)
Several attempts have been made to contact patient.  

## 2015-10-22 NOTE — Telephone Encounter (Signed)
Several attempts have been made to contact patient this encounter will be closed.  

## 2015-11-09 ENCOUNTER — Ambulatory Visit: Payer: Medicaid Other | Admitting: Physical Therapy

## 2015-11-10 LAB — HM DIABETES EYE EXAM

## 2015-11-11 ENCOUNTER — Encounter: Payer: Self-pay | Admitting: Physical Therapy

## 2015-11-11 ENCOUNTER — Ambulatory Visit: Payer: Medicaid Other | Attending: Student | Admitting: Physical Therapy

## 2015-11-11 DIAGNOSIS — R2689 Other abnormalities of gait and mobility: Secondary | ICD-10-CM | POA: Diagnosis present

## 2015-11-11 DIAGNOSIS — R29898 Other symptoms and signs involving the musculoskeletal system: Secondary | ICD-10-CM | POA: Diagnosis present

## 2015-11-11 DIAGNOSIS — R2681 Unsteadiness on feet: Secondary | ICD-10-CM

## 2015-11-12 NOTE — Therapy (Signed)
[facility] [address] [place], [place], [address] Phone: [phone]   Fax:  [phone]  Physical Therapy Treatment  Patient Details  Name: Brendan Campbell MRN: [id number] Date of Birth: 02-05-1971 Referring Provider: Mechele Claude PA  Encounter Date: 11/11/2015      PT End of Session - 11/11/15 1159    Visit Number 5   Number of Visits 9   Authorization Type Medicaid   Authorization Time Period 08/05/15- 11/24/15   Authorization - Visit Number 4   Authorization - Number of Visits 8   PT Start Time 6256   PT Stop Time 1145   PT Time Calculation (min) 42 min   Equipment Utilized During Treatment Gait belt   Activity Tolerance Patient tolerated treatment well   Behavior During Therapy WFL for tasks assessed/performed      Past Medical History:  Diagnosis Date  . Anemia   . Anxiety   . Blood transfusion without reported diagnosis   . Depression   . Diabetes mellitus without complication ([place])   . Hyperlipidemia   . Neuropathy ([place])   . PTSD (post-traumatic stress disorder)     Past Surgical History:  Procedure Laterality Date  . CHOLECYSTECTOMY    . FOOT SURGERY      There were no vitals filed for this visit.      Subjective Assessment - 11/11/15 1104    Subjective Patient reports wear 5-6 days/week from 8-12 hrs. No pain he just sometimes does not feel like putting them on if he is not leaving his home.    Pertinent History LBP, Chronic renal stage 4, DM2, neuropathy, suicide attempt (april 2016), neuropathy, 1st and 2nd toe amputation LLE   Limitations Lifting;Standing;Walking   Patient Stated Goals Walking with prosthesis in home & community.   Currently in Pain? No/denies     Prosthetic Training with Transtibial Amputation prosthesis: See pt education. PT instructed in recommendation for daily wear from arising to getting ready for bed except bathing with drying limb/liner q4-5 hrs or prn. Pt  verbalized understanding. No changes to residual limb. Pt donned prosthesis correctly & verbalized proper cleaning. Patient ambulated 300' X 3 with single point cane & prosthesis with supervision working on head turns to scan, negotiating over & around obstacles with instruction in technique. Patient negotiated 4 steps with single rail & cane with step-to pattern with supervision. PT demo & instructed in proper technique with prosthesis on ramps & curbs. PT return demo with supervision & verbal cues for carryover.  Pt able to reache 5" and to floor without UE support with supervision safely. PT demo, instructed in cycling with foot ergometer. Patient return demo understanding on stationery bike. PT demo how to mount, dismount, start & stop on simulated regular bike. Pt verbalized general understanding & recommendation not to try yet.                             PT Education - 11/11/15 1110    Education provided Yes   Education Details Fall risk and activity level when prostheses off and inconsistency increase risk of skin breakdown.    Person(s) Educated Patient   Methods Explanation;Verbal cues   Comprehension Verbalized understanding;Verbal cues required;Need further instruction          PT Short Term Goals - 11/11/15 1606      PT SHORT TERM GOAL #1   Title Pt will wear prosthesis >8 hours  total /day with no skin issues.   (Target Date: 4th treatment after evaluation)   Baseline 11/11/2015 MET   Status Achieved     PT SHORT TERM GOAL #2   Title Patient demonstrates proper donning and verbalizes proper cleaning of prosthesis & residual limb.  (Target Date: 4th treatment after evaluation)   Baseline MET 11/11/2015   Status Achieved     PT SHORT TERM GOAL #3   Title Pt will be able to ambulate 300' with supervision and LRAD & prosthesis.  (Target Date: 4th treatment after evaluation)   Baseline MET 11/11/2015 with single point cane & prosthesis   Status Achieved      PT SHORT TERM GOAL #4   Title Pt will be able to negogiate ramps, curbs, stairs with supervision and LRAD & prosthesis.  (Target Date: 4th treatment after evaluation)   Baseline MET 11/11/2015 with single point cane on ramps & curbs and rail/cane on stairs.    Status Achieved     PT SHORT TERM GOAL #5   Title Pt will be able to reach 5" in all directions & to floor without UE support with supervision. (Target Date: 4th treatment after evaluation)   Baseline MET 11/11/2015   Status Achieved           PT Long Term Goals - 09/23/15 1233      PT LONG TERM GOAL #1   Title Pt will be independent with prosthetic and residual limb care to enable safe use of prosthesis (Target Date: 8th treatment after evaluation)   Baseline Pt is dependent with all prosthetic and residual limb care. He has already created small blister on incision line with improper care.   Status On-going     PT LONG TERM GOAL #2   Title Pt will tolerate wear of prosthesis >90% of all awake hours to enable function throughout his awake hours.  (Target Date: 8th treatment after evaluation)   Baseline Patient has tolerated wear only 5 of 8 days since delivery up to 2hrs.    Status On-going     PT LONG TERM GOAL #3   Title Pt will be able to ambulate 500' including grass surfaces modified independent with LRAD & prosthesis to enable increased mobility in the community  (Target Date: 8th treatment after evaluation)   Baseline Pt able to walk 40' with walker & prosthesis with deviations limited balance & proper prosthesis use. Gait speed of 0.45f/s indicates household ambulator & fall risk.   Status On-going     PT LONG TERM GOAL #4   Title Pt will be able to negotiate ramps, stairs and curbs modified independent with LRAD & prosthesis to enable safety with community access  (Target Date: 8th treatment after evaluation)   Baseline Pt is currently unable to ambulate on ramps, stairs and curbs with prosthesis &  unknowledgeable in proper technique.   Status On-going     PT LONG TERM GOAL #5   Title Pt Berg Balance Score will increase to > 30/56 to indicate decreased fall risk  (Target Date: 8th treatment after evaluation)   Baseline Initial Berg Balance is 14/56   Status On-going               Plan - 11/11/15 1200    Clinical Impression Statement Patient met all STGs set to be met on 5th visit today. He appears will need date extension to get remaining 4 visits and meet LTGs as appear still potential to achieve.  Rehab Potential Good   PT Frequency 1x / week   PT Duration 8 weeks   PT Treatment/Interventions Passive range of motion;ADLs/Self Care Home Management;Therapeutic exercise;Therapeutic activities;Stair training;Gait training;DME Instruction;Functional mobility training;Balance training;Neuromuscular re-education;Patient/family education;Prosthetic Training;Manual techniques;Scar mobilization   PT Next Visit Plan Prosthetic gait, review prosthetic and residual limb care, initiate HEP for midline, Assess if tried stationery bike   Consulted and Agree with Plan of Care Patient      Patient will benefit from skilled therapeutic intervention in order to improve the following deficits and impairments:  Decreased activity tolerance, Decreased balance, Decreased mobility, Decreased strength, Postural dysfunction, Prosthetic Dependency, Impaired flexibility, Decreased skin integrity, Decreased knowledge of use of DME, Abnormal gait, Decreased endurance  Visit Diagnosis: Unsteadiness on feet  Other abnormalities of gait and mobility  Other symptoms and signs involving the musculoskeletal system     Problem List Patient Active Problem List   Diagnosis Date Noted  . S/P BKA (below knee amputation) unilateral ([place]) 03/05/2015  . Chronic renal insufficiency, stage IV (severe) ([place]) 03/04/2015  . DM2 (diabetes mellitus, type 2) ([place]) 03/03/2015  . HLD (hyperlipidemia) 03/03/2015  .  Back pain 03/03/2015    Jamey Reas PT, DPT 11/12/2015, 4:10 PM  [facility] [address] [place], [place], [address] Phone: [phone]   Fax:  [phone]  Name: Brendan Campbell MRN: [id number] Date of Birth: 16-Dec-1970

## 2015-11-17 ENCOUNTER — Encounter: Payer: Self-pay | Admitting: Physical Therapy

## 2015-11-17 ENCOUNTER — Ambulatory Visit: Payer: Medicaid Other | Admitting: Physical Therapy

## 2015-11-17 DIAGNOSIS — R2681 Unsteadiness on feet: Secondary | ICD-10-CM

## 2015-11-17 DIAGNOSIS — R29898 Other symptoms and signs involving the musculoskeletal system: Secondary | ICD-10-CM

## 2015-11-17 DIAGNOSIS — R2689 Other abnormalities of gait and mobility: Secondary | ICD-10-CM

## 2015-11-17 NOTE — Therapy (Signed)
[facility] [address] [place], [place], [address] Phone: [phone]   Fax:  [phone]  Physical Therapy Treatment  Patient Details  Name: Brendan Campbell MRN: [id number] Date of Birth: 06/03/1970 Referring Provider: Mechele Claude PA  Encounter Date: 11/17/2015      PT End of Session - 11/17/15 1323    Visit Number 6   Number of Visits 9   Authorization Type Medicaid   Authorization Time Period 08/05/15- 11/24/15   Authorization - Visit Number 5   Authorization - Number of Visits 8   PT Start Time 8546   PT Stop Time 1358   PT Time Calculation (min) 42 min   Equipment Utilized During Treatment Gait belt   Activity Tolerance Patient tolerated treatment well   Behavior During Therapy WFL for tasks assessed/performed      Past Medical History:  Diagnosis Date  . Anemia   . Anxiety   . Blood transfusion without reported diagnosis   . Depression   . Diabetes mellitus without complication ([place])   . Hyperlipidemia   . Neuropathy ([place])   . PTSD (post-traumatic stress disorder)     Past Surgical History:  Procedure Laterality Date  . CHOLECYSTECTOMY    . FOOT SURGERY      There were no vitals filed for this visit.      Subjective Assessment - 11/17/15 1322    Subjective No new compaints. No pain (took pain for back pain) or new falls to report. Has new rubber quad tip on his cane.    Pertinent History LBP, Chronic renal stage 4, DM2, neuropathy, suicide attempt (april 2016), neuropathy, 1st and 2nd toe amputation LLE   Limitations Lifting;Standing;Walking   Patient Stated Goals Walking with prosthesis in home & community.   Currently in Pain? No/denies             [facility] Adult PT Treatment/Exercise - 11/17/15 1323      Transfers   Transfers Sit to Stand;Stand to Sit   Sit to Stand 5: Supervision;With upper extremity assist;From chair/3-in-1;With armrests   Stand to Sit 5: Supervision;With upper  extremity assist;To chair/3-in-1;With armrests     Ambulation/Gait   Ambulation/Gait Yes   Ambulation/Gait Assistance 5: Supervision;4: Min guard   Ambulation/Gait Assistance Details cues on posture, base of support (for more normalized base of support), step length and weight shifting in stance. Attempted to have pt use cane in proper hand in proper position however pt refusing to try.                          Ambulation Distance (Feet) 220 Feet  x 2; 215 ft in/outdoor   Assistive device Prosthesis;Straight cane  with rubber tip   Gait Pattern Decreased stance time - right;Decreased step length - left;Decreased stride length;Trunk flexed;Trendelenburg;Poor foot clearance - right;Right hip hike;Right circumduction;Decreased weight shift to right;Decreased hip/knee flexion - right;Abducted- right;Step-through pattern   Ambulation Surface Level;Indoor;Unlevel;Outdoor;Paved   Stairs Yes   Stairs Assistance 4: Min guard   Stairs Assistance Details (indicate cue type and reason) 1 rep each with cane/rail combo using each rail. cues for sequencing with cane, for posture and for weight shifitng,/hand advancement along rails   Stair Management Technique One rail Right;One rail Left;Step to pattern;Forwards;With cane   Number of Stairs 4  x 2 reps   Curb 4: Min assist  with cane and prosthesis   Curb Details (indicate cue type and reason) x1  on outdoor curb with cues on sequencing and technique     Prosthetics   Prosthetic Care Comments  reviewed signs of sweating with pt   Current prosthetic wear tolerance (days/week)  daily   Current prosthetic wear tolerance (#hours/day)  all awake hours, drying as needed with sweating             PT Short Term Goals - 11/11/15 1606      PT SHORT TERM GOAL #1   Title Pt will wear prosthesis >8 hours total /day with no skin issues.   (Target Date: 4th treatment after evaluation)   Baseline 11/11/2015 MET   Status Achieved     PT SHORT TERM GOAL #2    Title Patient demonstrates proper donning and verbalizes proper cleaning of prosthesis & residual limb.  (Target Date: 4th treatment after evaluation)   Baseline MET 11/11/2015   Status Achieved     PT SHORT TERM GOAL #3   Title Pt will be able to ambulate 300' with supervision and LRAD & prosthesis.  (Target Date: 4th treatment after evaluation)   Baseline MET 11/11/2015 with single point cane & prosthesis   Status Achieved     PT SHORT TERM GOAL #4   Title Pt will be able to negogiate ramps, curbs, stairs with supervision and LRAD & prosthesis.  (Target Date: 4th treatment after evaluation)   Baseline MET 11/11/2015 with single point cane on ramps & curbs and rail/cane on stairs.    Status Achieved     PT SHORT TERM GOAL #5   Title Pt will be able to reach 5" in all directions & to floor without UE support with supervision. (Target Date: 4th treatment after evaluation)   Baseline MET 11/11/2015   Status Achieved           PT Long Term Goals - 09/23/15 1233      PT LONG TERM GOAL #1   Title Pt will be independent with prosthetic and residual limb care to enable safe use of prosthesis (Target Date: 8th treatment after evaluation)   Baseline Pt is dependent with all prosthetic and residual limb care. He has already created small blister on incision line with improper care.   Status On-going     PT LONG TERM GOAL #2   Title Pt will tolerate wear of prosthesis >90% of all awake hours to enable function throughout his awake hours.  (Target Date: 8th treatment after evaluation)   Baseline Patient has tolerated wear only 5 of 8 days since delivery up to 2hrs.    Status On-going     PT LONG TERM GOAL #3   Title Pt will be able to ambulate 500' including grass surfaces modified independent with LRAD & prosthesis to enable increased mobility in the community  (Target Date: 8th treatment after evaluation)   Baseline Pt able to walk 40' with walker & prosthesis with deviations limited balance  & proper prosthesis use. Gait speed of 0.27f/s indicates household ambulator & fall risk.   Status On-going     PT LONG TERM GOAL #4   Title Pt will be able to negotiate ramps, stairs and curbs modified independent with LRAD & prosthesis to enable safety with community access  (Target Date: 8th treatment after evaluation)   Baseline Pt is currently unable to ambulate on ramps, stairs and curbs with prosthesis & unknowledgeable in proper technique.   Status On-going     PT LONG TERM GOAL #5   Title Pt Berg Balance  Score will increase to > 30/56 to indicate decreased fall risk  (Target Date: 8th treatment after evaluation)   Baseline Initial Merrilee Jansky Balance is 14/56   Status On-going            Plan - 11/17/15 1323    Clinical Impression Statement Today's skilled session continued to address mobility with prosthesis and straight cane. Pt using cane on arrival to session. Uses cane in right hand (same side as prosthesis) with handle turned around backwards. Pt was cued and educated on proper use/position of cane with gait for safety/balance however,  pt adamant to keep using cane as he is stating "i loose my balance every time it's on the left". Cues needed for posture and step lenght with gait today. Pt is making slow, steady progress toward LTGs. Session limited due to pt needing frequent seated rest breaks stating "my medicine is taking control". When asked what this meant, pt stated 'it's making me tired". He reportes he has not attempted to ride stationary bike at this time due to not having one available and no ride to go look for one. Pt should benefit from continued PT to progress toward unmet LTGs.                               Rehab Potential Good   PT Frequency 1x / week   PT Duration 8 weeks   PT Treatment/Interventions Passive range of motion;ADLs/Self Care Home Management;Therapeutic exercise;Therapeutic activities;Stair training;Gait training;DME Instruction;Functional mobility  training;Balance training;Neuromuscular re-education;Patient/family education;Prosthetic Training;Manual techniques;Scar mobilization   PT Next Visit Plan Prosthetic gait, review prosthetic and residual limb care, initiate HEP for midline, Assess if tried stationery bike   Consulted and Agree with Plan of Care Patient      Patient will benefit from skilled therapeutic intervention in order to improve the following deficits and impairments:  Decreased activity tolerance, Decreased balance, Decreased mobility, Decreased strength, Postural dysfunction, Prosthetic Dependency, Impaired flexibility, Decreased skin integrity, Decreased knowledge of use of DME, Abnormal gait, Decreased endurance  Visit Diagnosis: Unsteadiness on feet  Other abnormalities of gait and mobility  Other symptoms and signs involving the musculoskeletal system     Problem List Patient Active Problem List   Diagnosis Date Noted  . S/P BKA (below knee amputation) unilateral ([place]) 03/05/2015  . Chronic renal insufficiency, stage IV (severe) ([place]) 03/04/2015  . DM2 (diabetes mellitus, type 2) ([place]) 03/03/2015  . HLD (hyperlipidemia) 03/03/2015  . Back pain 03/03/2015    Willow Ora, PTA, [facility] [address] [place], [place] [address] [phone] 11/17/15, 2:45 PM   Name: Sriman Tally MRN: [id number] Date of Birth: [birth year]

## 2015-11-24 ENCOUNTER — Ambulatory Visit: Payer: Medicaid Other | Admitting: Physical Therapy

## 2015-11-24 ENCOUNTER — Encounter: Payer: Self-pay | Admitting: Physical Therapy

## 2015-11-24 DIAGNOSIS — R2681 Unsteadiness on feet: Secondary | ICD-10-CM

## 2015-11-24 DIAGNOSIS — R2689 Other abnormalities of gait and mobility: Secondary | ICD-10-CM

## 2015-11-24 DIAGNOSIS — R29898 Other symptoms and signs involving the musculoskeletal system: Secondary | ICD-10-CM

## 2015-11-25 NOTE — Therapy (Signed)
[facility] [address] [place], [place], [address] Phone: [phone]   Fax:  [phone]  Physical Therapy Treatment  Patient Details  Name: Brendan Campbell MRN: [id number] Date of Birth: [birth year] Referring Provider: Mechele Claude PA  Encounter Date: 11/24/2015      PT End of Session - 11/24/15 1500    Visit Number 7   Number of Visits 9   Authorization Type Medicaid   Authorization Time Period 08/05/15- 11/24/15; extension thru 12/22/2015   Authorization - Visit Number 6   Authorization - Number of Visits 8   PT Start Time 9233   PT Stop Time 1400   PT Time Calculation (min) 45 min   Equipment Utilized During Treatment Gait belt   Activity Tolerance Patient tolerated treatment well   Behavior During Therapy [facility] for tasks assessed/performed      Past Medical History:  Diagnosis Date  . Anemia   . Anxiety   . Blood transfusion without reported diagnosis   . Depression   . Diabetes mellitus without complication ([place])   . Hyperlipidemia   . Neuropathy ([place])   . PTSD (post-traumatic stress disorder)     Past Surgical History:  Procedure Laterality Date  . CHOLECYSTECTOMY    . FOOT SURGERY      There were no vitals filed for this visit.      Subjective Assessment - 11/24/15 1321    Subjective Wore prosthesis every day but one since last appt. He had rash on back of thigh area.      Pertinent History LBP, Chronic renal stage 4, DM2, neuropathy, suicide attempt (april 2016), neuropathy, 1st and 2nd toe amputation LLE   Limitations Lifting;Standing;Walking   Patient Stated Goals Walking with prosthesis in home & community.   Currently in Pain? No/denies                         [facility] Adult PT Treatment/Exercise - 11/24/15 1315      Transfers   Transfers Sit to Stand;Stand to Sit   Sit to Stand 5: Supervision;With upper extremity assist;From chair/3-in-1;With armrests   Stand to Sit  5: Supervision;With upper extremity assist;To chair/3-in-1;With armrests     Ambulation/Gait   Ambulation/Gait Yes   Ambulation/Gait Assistance 5: Supervision;4: Min assist  MinA on grass & negotiating obstacles   Ambulation/Gait Assistance Details verbal & tactile cues on posture, balance reactions, negotiating around & over obstacles,    Ambulation Distance (Feet) 250 Feet  250' X 2, 400' outdoors   Assistive device Prosthesis;Straight cane  with rubber tip   Gait Pattern Decreased stance time - right;Decreased step length - left;Decreased stride length;Trunk flexed;Trendelenburg;Poor foot clearance - right;Right hip hike;Right circumduction;Decreased weight shift to right;Decreased hip/knee flexion - right;Abducted- right;Step-through pattern   Ambulation Surface Indoor;Level;Outdoor;Paved;Gravel;Grass  10' on gravel & 40' on grass   Stairs Yes   Stairs Assistance 5: Supervision   Stairs Assistance Details (indicate cue type and reason) demo & cues on reciprocal pattern   Stair Management Technique Forwards;Two rails;Alternating pattern   Number of Stairs 4  x 4 reps   Ramp 4: Min assist  cane & prosthesis   Ramp Details (indicate cue type and reason) verbal & tactile cues on posture & wt shift   Curb 4: Min assist  with cane and prosthesis   Curb Details (indicate cue type and reason) demo & cues on technique including maintaining momentum, posture, and step length  High Level Balance   High Level Balance Activities Negotitating around obstacles;Negotiating over obstacles;Figure 8 turns;Head turns  single point cane & prosthesis   High Level Balance Comments tactile & verbal cues on technique with prosthesis     Prosthetics   Prosthetic Care Comments  reviewed signs of sweating with pt; use of cut-off sock proximally under liner as barrier and distally over liner for volume management.    Current prosthetic wear tolerance (days/week)  daily   Current prosthetic wear  tolerance (#hours/day)  all awake hours, drying as needed with sweating   Residual limb condition  Red not open area on posterior, proximal thigh at liner. No other issues noted.    Education Provided Skin check;Residual limb care;Correct ply sock adjustment;Proper wear schedule/adjustment  See prosthetic care comment   Person(s) Educated Patient   Education Method Explanation;Demonstration;Tactile cues;Verbal cues   Education Method Verbalized understanding;Verbal cues required;Needs further instruction;Tactile cues required                  PT Short Term Goals - 11/11/15 1606      PT SHORT TERM GOAL #1   Title Pt will wear prosthesis >8 hours total /day with no skin issues.   (Target Date: 4th treatment after evaluation)   Baseline 11/11/2015 MET   Status Achieved     PT SHORT TERM GOAL #2   Title Patient demonstrates proper donning and verbalizes proper cleaning of prosthesis & residual limb.  (Target Date: 4th treatment after evaluation)   Baseline MET 11/11/2015   Status Achieved     PT SHORT TERM GOAL #3   Title Pt will be able to ambulate 300' with supervision and LRAD & prosthesis.  (Target Date: 4th treatment after evaluation)   Baseline MET 11/11/2015 with single point cane & prosthesis   Status Achieved     PT SHORT TERM GOAL #4   Title Pt will be able to negogiate ramps, curbs, stairs with supervision and LRAD & prosthesis.  (Target Date: 4th treatment after evaluation)   Baseline MET 11/11/2015 with single point cane on ramps & curbs and rail/cane on stairs.    Status Achieved     PT SHORT TERM GOAL #5   Title Pt will be able to reach 5" in all directions & to floor without UE support with supervision. (Target Date: 4th treatment after evaluation)   Baseline MET 11/11/2015   Status Achieved           PT Long Term Goals - 09/23/15 1233      PT LONG TERM GOAL #1   Title Pt will be independent with prosthetic and residual limb care to enable safe use of  prosthesis (Target Date: 8th treatment after evaluation)   Baseline Pt is dependent with all prosthetic and residual limb care. He has already created small blister on incision line with improper care.   Status On-going     PT LONG TERM GOAL #2   Title Pt will tolerate wear of prosthesis >90% of all awake hours to enable function throughout his awake hours.  (Target Date: 8th treatment after evaluation)   Baseline Patient has tolerated wear only 5 of 8 days since delivery up to 2hrs.    Status On-going     PT LONG TERM GOAL #3   Title Pt will be able to ambulate 500' including grass surfaces modified independent with LRAD & prosthesis to enable increased mobility in the community  (Target Date: 8th treatment after evaluation)  Baseline Pt able to walk 40' with walker & prosthesis with deviations limited balance & proper prosthesis use. Gait speed of 0.81f/s indicates household ambulator & fall risk.   Status On-going     PT LONG TERM GOAL #4   Title Pt will be able to negotiate ramps, stairs and curbs modified independent with LRAD & prosthesis to enable safety with community access  (Target Date: 8th treatment after evaluation)   Baseline Pt is currently unable to ambulate on ramps, stairs and curbs with prosthesis & unknowledgeable in proper technique.   Status On-going     PT LONG TERM GOAL #5   Title Pt Berg Balance Score will increase to > 30/56 to indicate decreased fall risk  (Target Date: 8th treatment after evaluation)   Baseline Initial BMerrilee JanskyBalance is 14/56   Status On-going               Plan - 11/24/15 1500    Clinical Impression Statement Patient appears to have skin issue at proximal aspect of liner due to sweat issue but should respond to use of cut-off sock under proximal liner. Patient improved gait negotiating around & over obstacles with skilled instruction.    Rehab Potential Good   PT Frequency 1x / week   PT Duration 8 weeks   PT Treatment/Interventions  Passive range of motion;ADLs/Self Care Home Management;Therapeutic exercise;Therapeutic activities;Stair training;Gait training;DME Instruction;Functional mobility training;Balance training;Neuromuscular re-education;Patient/family education;Prosthetic Training;Manual techniques;Scar mobilization   PT Next Visit Plan Prosthetic gait, review prosthetic and residual limb care, initiate HEP for midline, Assess if tried stationery bike   Consulted and Agree with Plan of Care Patient      Patient will benefit from skilled therapeutic intervention in order to improve the following deficits and impairments:  Decreased activity tolerance, Decreased balance, Decreased mobility, Decreased strength, Postural dysfunction, Prosthetic Dependency, Impaired flexibility, Decreased skin integrity, Decreased knowledge of use of DME, Abnormal gait, Decreased endurance  Visit Diagnosis: Unsteadiness on feet  Other abnormalities of gait and mobility  Other symptoms and signs involving the musculoskeletal system     Problem List Patient Active Problem List   Diagnosis Date Noted  . S/P BKA (below knee am[place]) unilateral ([place] 03/05/2015  . Chronic renal insufficiency, stage IV (severe) ([place] 03/04/2015  . DM2 (diabetes mellitus, type 2) ([place] 03/03/2015  . HLD (hyperlipidemia) 03/03/2015  . Back pain 03/03/2015    WJamey ReasPT, DPT 11/25/2015, 6:20 AM  [facility] [address]Phone: [phone]  Fax:  [phone] Name: Brendan PendryMRN: [id number] of Birth: [birth year]

## 2015-12-01 ENCOUNTER — Ambulatory Visit: Payer: Medicaid Other | Admitting: Physical Therapy

## 2015-12-08 ENCOUNTER — Encounter: Payer: Self-pay | Admitting: Physical Therapy

## 2015-12-08 ENCOUNTER — Ambulatory Visit: Payer: Medicaid Other | Admitting: Physical Therapy

## 2015-12-08 DIAGNOSIS — R29898 Other symptoms and signs involving the musculoskeletal system: Secondary | ICD-10-CM

## 2015-12-08 DIAGNOSIS — R2689 Other abnormalities of gait and mobility: Secondary | ICD-10-CM

## 2015-12-08 DIAGNOSIS — R2681 Unsteadiness on feet: Secondary | ICD-10-CM

## 2015-12-08 NOTE — Patient Instructions (Signed)

## 2015-12-08 NOTE — Therapy (Signed)
[facility] [address] [place], [place], [address] Phone: [phone]   Fax:  [phone]  Physical Therapy Treatment  Patient Details  Name: Brendan Campbell MRN: [id number] Date of Birth: 1971/01/21 Referring Provider: Mechele Claude PA  Encounter Date: 12/08/2015      PT End of Session - 12/08/15 1441    Visit Number 8   Number of Visits 9   Authorization Type Medicaid   Authorization Time Period 08/05/15- 11/24/15; extension thru 12/22/2015   Authorization - Visit Number 7   Authorization - Number of Visits 8   PT Start Time 6962   PT Stop Time 1430   PT Time Calculation (min) 43 min   Equipment Utilized During Treatment Gait belt   Activity Tolerance Patient tolerated treatment well   Behavior During Therapy [facility] for tasks assessed/performed      Past Medical History:  Diagnosis Date  . Anemia   . Anxiety   . Blood transfusion without reported diagnosis   . Depression   . Diabetes mellitus without complication ([place])   . Hyperlipidemia   . Neuropathy ([place])   . PTSD (post-traumatic stress disorder)     Past Surgical History:  Procedure Laterality Date  . CHOLECYSTECTOMY    . FOOT SURGERY      There were no vitals filed for this visit.      Subjective Assessment - 12/08/15 1349    Subjective He is wearing all awake hours with no issues. Rash has gone away. He reports has not tried stationery bike yet.    Pertinent History LBP, Chronic renal stage 4, DM2, neuropathy, suicide attempt (april 2016), neuropathy, 1st and 2nd toe amputation LLE   Limitations Lifting;Standing;Walking   Patient Stated Goals Walking with prosthesis in home & community.   Currently in Pain? No/denies                Vestibular Assessment - 12/08/15 1340      Positional Testing   Sidelying Test Sidelying Right;Sidelying Left     Sidelying Right   Sidelying Right Duration asymptomatic   Sidelying Right Symptoms No  nystagmus     Sidelying Left   Sidelying Left Duration asymptomatic   Sidelying Left Symptoms No nystagmus     Orthostatics   BP supine (x 5 minutes) 136/93   HR supine (x 5 minutes) 97   BP standing (after 1 minute) 99/77  dizzy with room spinning   HR standing (after 1 minute) 110   BP standing (after 3 minutes) 123/80  no dizziness   HR standing (after 3 minutes) 113                 [facility] Adult PT Treatment/Exercise - 12/08/15 1340      Ambulation/Gait   Ambulation/Gait Yes   Ambulation/Gait Assistance 5: Supervision   Ambulation/Gait Assistance Details cues on posture & using vision to orient to upright   Ambulation Distance (Feet) 250 Feet  X2   Assistive device Prosthesis;Straight cane   Gait Pattern Decreased stance time - right;Decreased step length - left;Decreased stride length;Trunk flexed;Trendelenburg;Poor foot clearance - right;Right hip hike;Right circumduction;Decreased weight shift to right;Decreased hip/knee flexion - right;Abducted- right;Step-through pattern   Ambulation Surface Indoor;Level   Ramp 4: Min assist  min guard with cane & prosthesis   Ramp Details (indicate cue type and reason) cues on wt shift & posture   Curb 4: Min assist  min gaurd with cane & prosthesis   Curb Details (indicate  cue type and reason) cues on step length & sequence                PT Education - 12/08/15 1440    Education provided Yes   Education Details orthostatic hypotension & seated head turns for vestibular habitutation   Person(s) Educated Patient   Methods Explanation;Demonstration;Verbal cues;Handout   Comprehension Verbalized understanding;Returned demonstration;Verbal cues required          PT Short Term Goals - 11/11/15 1606      PT SHORT TERM GOAL #1   Title Pt will wear prosthesis >8 hours total /day with no skin issues.   (Target Date: 4th treatment after evaluation)   Baseline 11/11/2015 MET   Status Achieved     PT SHORT TERM GOAL  #2   Title Patient demonstrates proper donning and verbalizes proper cleaning of prosthesis & residual limb.  (Target Date: 4th treatment after evaluation)   Baseline MET 11/11/2015   Status Achieved     PT SHORT TERM GOAL #3   Title Pt will be able to ambulate 300' with supervision and LRAD & prosthesis.  (Target Date: 4th treatment after evaluation)   Baseline MET 11/11/2015 with single point cane & prosthesis   Status Achieved     PT SHORT TERM GOAL #4   Title Pt will be able to negogiate ramps, curbs, stairs with supervision and LRAD & prosthesis.  (Target Date: 4th treatment after evaluation)   Baseline MET 11/11/2015 with single point cane on ramps & curbs and rail/cane on stairs.    Status Achieved     PT SHORT TERM GOAL #5   Title Pt will be able to reach 5" in all directions & to floor without UE support with supervision. (Target Date: 4th treatment after evaluation)   Baseline MET 11/11/2015   Status Achieved           PT Long Term Goals - 09/23/15 1233      PT LONG TERM GOAL #1   Title Pt will be independent with prosthetic and residual limb care to enable safe use of prosthesis (Target Date: 8th treatment after evaluation)   Baseline Pt is dependent with all prosthetic and residual limb care. He has already created small blister on incision line with improper care.   Status On-going     PT LONG TERM GOAL #2   Title Pt will tolerate wear of prosthesis >90% of all awake hours to enable function throughout his awake hours.  (Target Date: 8th treatment after evaluation)   Baseline Patient has tolerated wear only 5 of 8 days since delivery up to 2hrs.    Status On-going     PT LONG TERM GOAL #3   Title Pt will be able to ambulate 500' including grass surfaces modified independent with LRAD & prosthesis to enable increased mobility in the community  (Target Date: 8th treatment after evaluation)   Baseline Pt able to walk 40' with walker & prosthesis with deviations limited  balance & proper prosthesis use. Gait speed of 0.46f/s indicates household ambulator & fall risk.   Status On-going     PT LONG TERM GOAL #4   Title Pt will be able to negotiate ramps, stairs and curbs modified independent with LRAD & prosthesis to enable safety with community access  (Target Date: 8th treatment after evaluation)   Baseline Pt is currently unable to ambulate on ramps, stairs and curbs with prosthesis & unknowledgeable in proper technique.   Status On-going  PT LONG TERM GOAL #5   Title Pt Berg Balance Score will increase to > 30/56 to indicate decreased fall risk  (Target Date: 8th treatment after evaluation)   Baseline Initial Merrilee Jansky Balance is 14/56   Status On-going               Plan - 12/08/15 1442    Clinical Impression Statement Patient tested for orthostatic hypotension and dizziness with head movements. He appears to understand seated habitutation exercises. He is tolerating increased prosthesis wear. He is on target to meet LTGs next visit.    Rehab Potential Good   PT Frequency 1x / week   PT Duration 8 weeks   PT Treatment/Interventions Passive range of motion;ADLs/Self Care Home Management;Therapeutic exercise;Therapeutic activities;Stair training;Gait training;DME Instruction;Functional mobility training;Balance training;Neuromuscular re-education;Patient/family education;Prosthetic Training;Manual techniques;Scar mobilization   PT Next Visit Plan assess LTGs & discharge   Consulted and Agree with Plan of Care Patient      Patient will benefit from skilled therapeutic intervention in order to improve the following deficits and impairments:  Decreased activity tolerance, Decreased balance, Decreased mobility, Decreased strength, Postural dysfunction, Prosthetic Dependency, Impaired flexibility, Decreased skin integrity, Decreased knowledge of use of DME, Abnormal gait, Decreased endurance  Visit Diagnosis: Unsteadiness on feet  Other abnormalities  of gait and mobility  Other symptoms and signs involving the musculoskeletal system     Problem List Patient Active Problem List   Diagnosis Date Noted  . S/P BKA (below knee amputation) unilateral ([place]) 03/05/2015  . Chronic renal insufficiency, stage IV (severe) ([place]) 03/04/2015  . DM2 (diabetes mellitus, type 2) ([place]) 03/03/2015  . HLD (hyperlipidemia) 03/03/2015  . Back pain 03/03/2015    Jamey Reas PT, DPT 12/08/2015, 2:44 PM  [facility] [address] [place], [place], [address] Phone: [phone]   Fax:  [phone]  Name: Hiro Vipond MRN: [id number] Date of Birth: November 06, 1970

## 2015-12-16 ENCOUNTER — Ambulatory Visit (INDEPENDENT_AMBULATORY_CARE_PROVIDER_SITE_OTHER): Payer: Medicaid Other

## 2015-12-16 ENCOUNTER — Ambulatory Visit (INDEPENDENT_AMBULATORY_CARE_PROVIDER_SITE_OTHER): Payer: Medicaid Other | Admitting: Family Medicine

## 2015-12-16 ENCOUNTER — Encounter: Payer: Self-pay | Admitting: Family Medicine

## 2015-12-16 VITALS — BP 151/89 | HR 87 | Temp 96.9°F | Ht 73.83 in

## 2015-12-16 DIAGNOSIS — M5440 Lumbago with sciatica, unspecified side: Secondary | ICD-10-CM

## 2015-12-16 DIAGNOSIS — L97509 Non-pressure chronic ulcer of other part of unspecified foot with unspecified severity: Secondary | ICD-10-CM | POA: Diagnosis not present

## 2015-12-16 DIAGNOSIS — Z23 Encounter for immunization: Secondary | ICD-10-CM

## 2015-12-16 DIAGNOSIS — N184 Chronic kidney disease, stage 4 (severe): Secondary | ICD-10-CM

## 2015-12-16 DIAGNOSIS — G8929 Other chronic pain: Secondary | ICD-10-CM

## 2015-12-16 DIAGNOSIS — E11621 Type 2 diabetes mellitus with foot ulcer: Secondary | ICD-10-CM | POA: Diagnosis not present

## 2015-12-16 DIAGNOSIS — Z794 Long term (current) use of insulin: Secondary | ICD-10-CM

## 2015-12-16 LAB — BAYER DCA HB A1C WAIVED: HB A1C (BAYER DCA - WAIVED): 5.4 % (ref <7.0)

## 2015-12-16 IMAGING — DX DG LUMBAR SPINE 2-3V
3 series · 3 of 3 positions shown · non-contrast
Comparison: None in PACs

CLINICAL DATA: Low back pain with sciatic symptoms. Symptoms are
predominantly left-sided.

EXAM:
LUMBAR SPINE - 2-3 VIEW

[l-spine ap (1 of 2)]
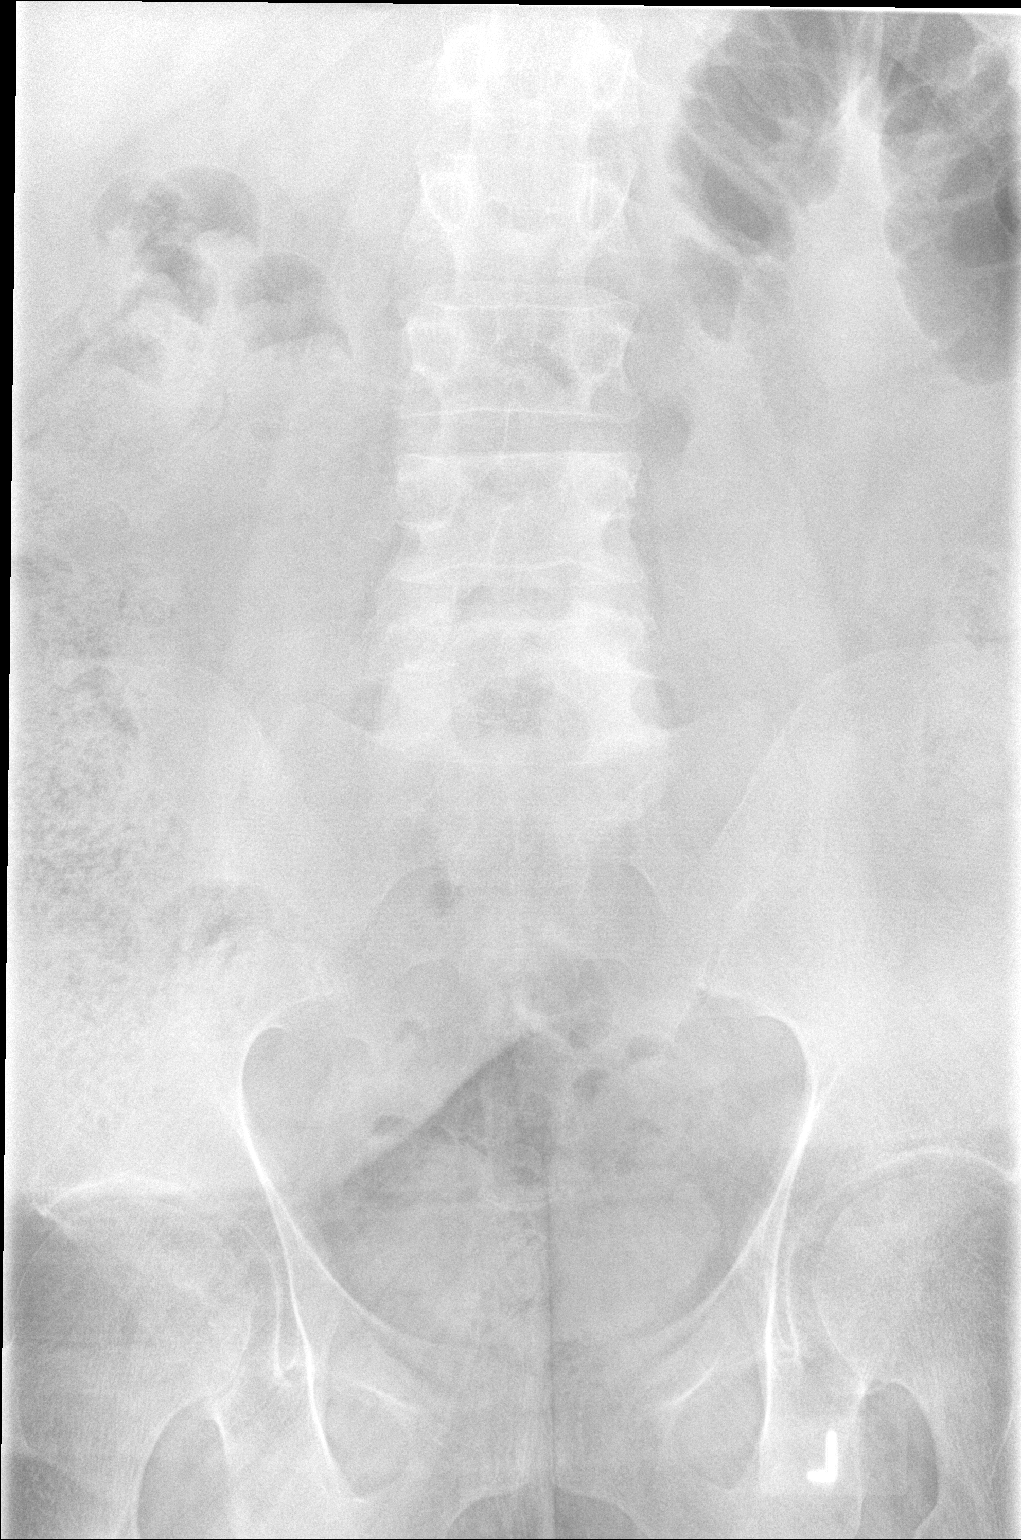

[l-spine lat]
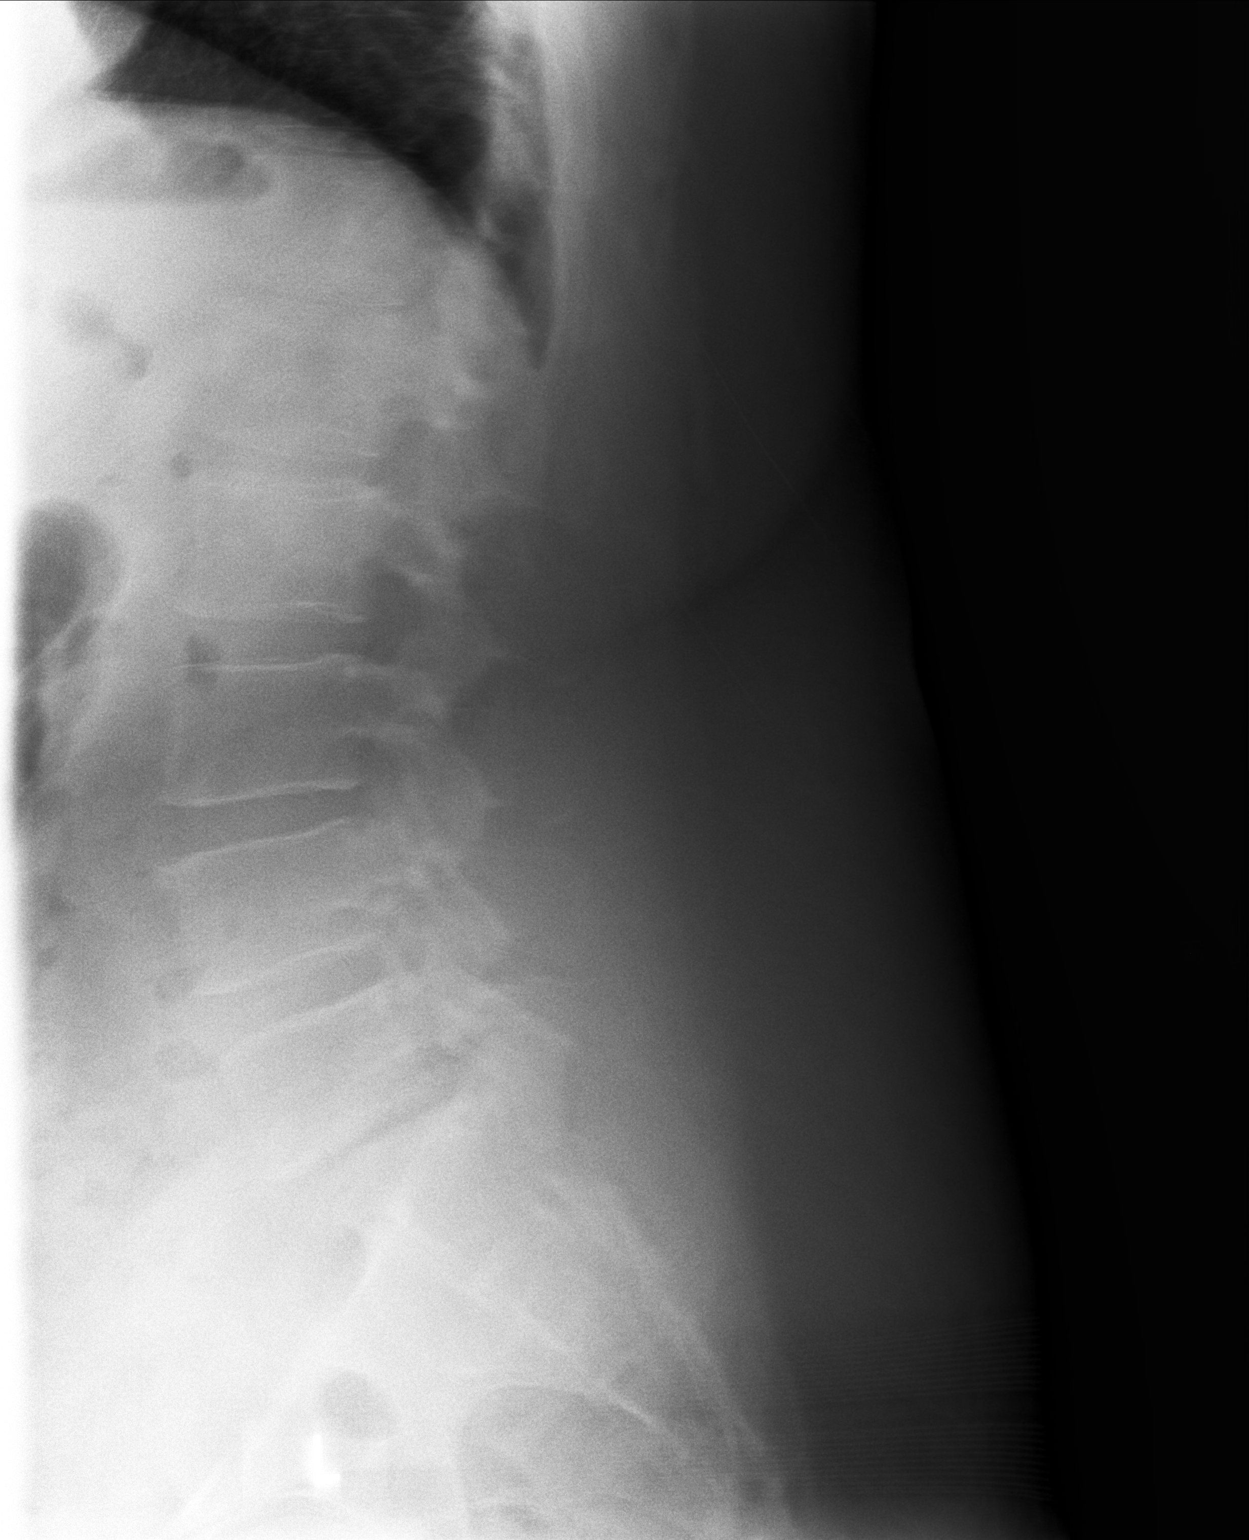

[l-spine ap (2 of 2)]
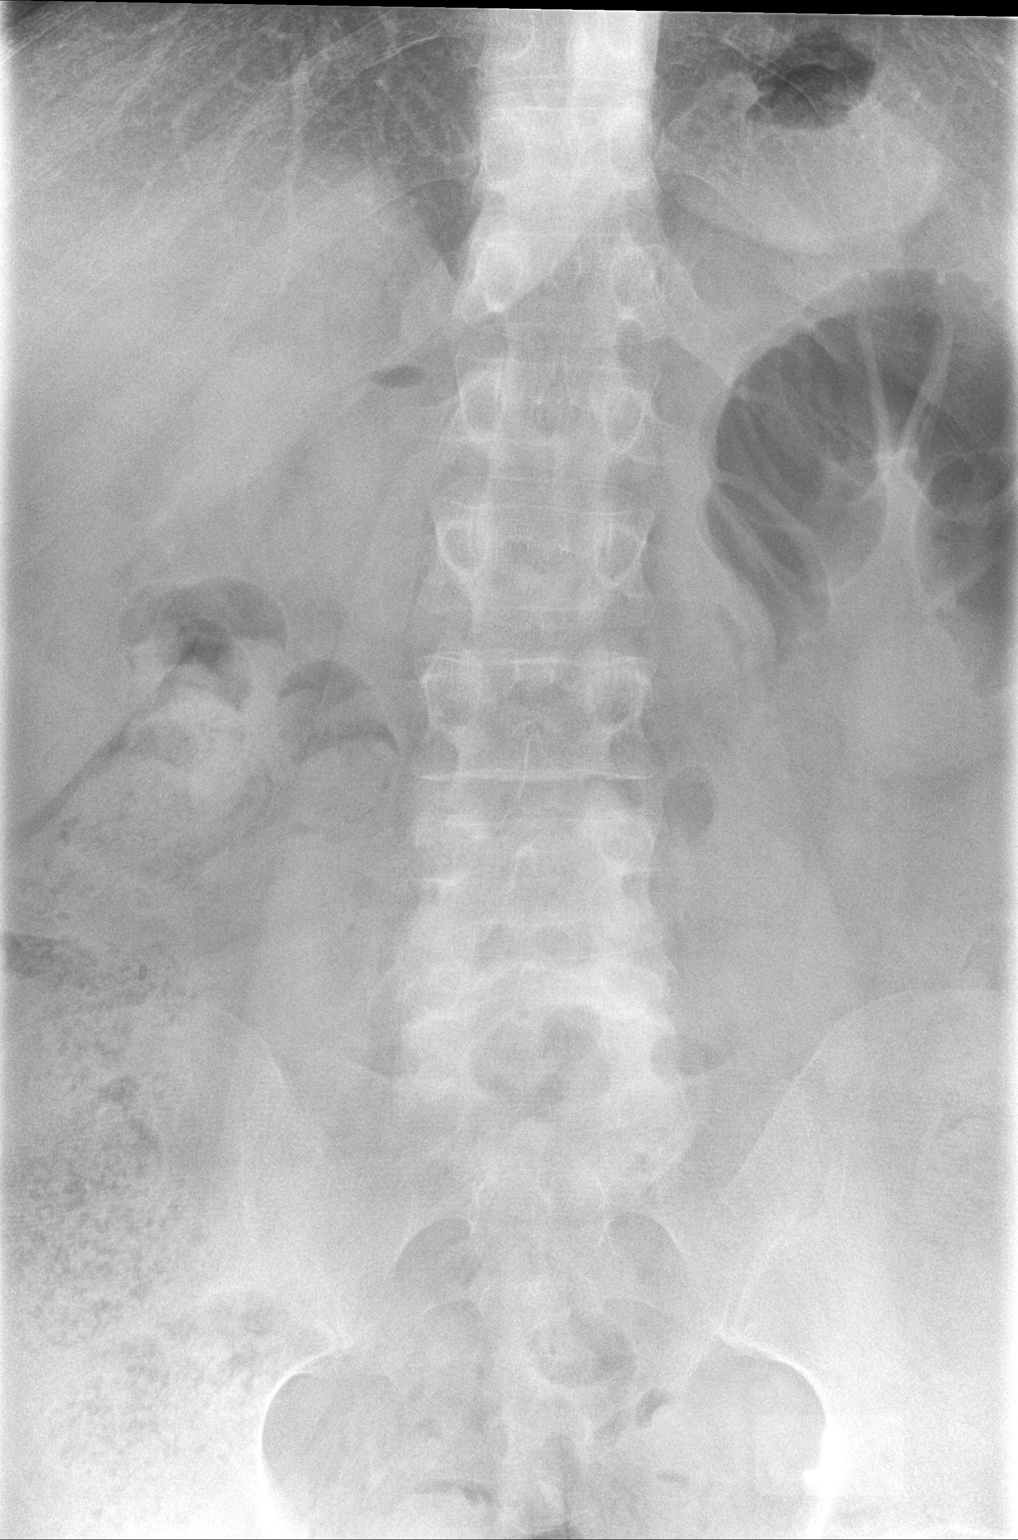

[3 of 3 positions shown; findings below may reference images not displayed]

FINDINGS: Thus images are limited due to the patient's body habitus and
resultant scatter effects. The lumbar vertebral bodies are preserved
in height. There is mild disc space narrowing at L5-S1. There is no
spondylolisthesis. The pedicles and transverse processes are intact.
Where visualized the spinous processes appear normal. The observed
portions of the sacrum are normal.
IMPRESSION: Mild degenerative disc disease centered at L5-S1. No compression
fracture nor other acute bony abnormality. If the patient is
experiencing radicular symptoms, lumbar spine MRI may be a useful
next imaging step.

## 2015-12-16 MED ORDER — TRAMADOL HCL 50 MG PO TABS: 50.0000 mg | ORAL_TABLET | Freq: Two times a day (BID) | ORAL | 2 refills | Status: DC | PRN

## 2015-12-16 MED ORDER — INSULIN GLARGINE 100 UNIT/ML SOLOSTAR PEN: 10.0000 [IU] | PEN_INJECTOR | Freq: Every day | SUBCUTANEOUS | 99 refills | Status: DC

## 2015-12-16 NOTE — Patient Instructions (Signed)
Great to see you!  Do not take any more than 1 tramadol every 12 hours, this is the maximum based on your kidney fiunction.   We will work on a referral for pain management  For diabetes:  Cut back to 10 units of lantus daily, if your blood sugars are consistently over 200 fasting you may slowly work your way back to 15 units.  (1 unit increase every 5-7 days)

## 2015-12-16 NOTE — Progress Notes (Signed)
HPI  Patient presents today for follow-up of type 2 diabetes, back pain, and to discuss his chronic renal disease.  Patient is approaching end-stage renal disease and resisting placement of a fistula.  Type 2 diabetes Patient watching his diet Good medication compliance, complains of several readings in the 30s and 40s. He does have symptoms of hypoglycemia with feeling weak and shaky Average fasting blood sugars 80-100.  Chronic back pain Complains of left-sided low back pain that's been going on for 20-25 years States he had a previous diagnosis of ruptured disc. Previously he was a cocaine abuser, he has not used drugs in 2 years.  Kidney disease As above, resisting getting his fistula placed I encouraged him to follow-up with his nephrologist. Is making a normal amount of urine.    PMH: Smoking status noted ROS: Per HPI  Objective: BP (!) 151/89   Pulse 87   Temp (!) 96.9 F (36.1 C) (Oral)   Ht 6' 1.83" (1.875 m)  Gen: NAD, alert, cooperative with exam HEENT: NCAT CV: RRR, good S1/S2 Resp: CTABL, no wheezes, non-labored Ext: RLE AKA with prosthesis in place Neuro: Alert and oriented, No gross deficits  Plain film low back: Disc space narrowing at L5/S1 No acute findings   Assessment and plan:  # Type 2 diabetes A1c 5.4, considering his low blood sugars I feel that he is being overtreated currently. It's likely that he's having reduced insulin clearance due to worsening renal disease. 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  # Chronic low back pain Pain reasonably well managed with tramadol His nephrologist has expressed concern about taking tramadol with ESRD. Patient is actively resisting preparing for dialysis Considering his labs last visit, and lack of nephrology follow-up since that time I have decided to stop  tramadol. I've instructed him to take 1 tramadol daily for 1 week then one half tramadol daily for 1 week then stop.  We give him a prescription, however I went back to him and discussed with him the dangers that I'm concerned about. He has returned the prescription to me and I shredded it.  Consider Tylenol for pain  Considering history he's too high risk for narcotic management in our clinic.   # Chronic kidney disease Approaching ESRD Recommended considering fistula placement Labs, recommended following up with his nephrologist. He is avoiding this due to hesitation with dialysis.    Orders Placed This Encounter  Procedures  . DG Lumbar Spine 2-3 Views    Standing Status:   Future    Standing Expiration Date:   02/14/2017    Order Specific Question:   Reason for Exam (SYMPTOM  OR DIAGNOSIS REQUIRED)    Answer:   chronic L sided low back pain    Order Specific Question:   Preferred imaging location?    Answer:   Internal  . Bayer DCA Hb A1c Waived  . CMP14+EGFR  . Ambulatory referral to Pain Clinic    Referral Priority:   Routine    Referral Type:   Consultation    Referral Reason:   Specialty Services Required    Requested Specialty:   Pain Medicine    Number of Visits Requested:   1    Meds ordered this encounter  Medications  . DISCONTD: traMADol (ULTRAM) 50 MG tablet    Sig: Take 1 tablet (50 mg total) by mouth every  12 (twelve) hours as needed.    Dispense:  60 tablet    Refill:  2  . Insulin Glargine (LANTUS SOLOSTAR) 100 UNIT/ML Solostar Pen    Sig: Inject 10-15 Units into the skin daily at 10 pm.    Dispense:  5 pen    Refill:  PRN    Please dispense QS for 1 month    Laroy Apple, MD [facility] 12/16/2015, 9:46 AM

## 2015-12-17 ENCOUNTER — Other Ambulatory Visit: Payer: Self-pay | Admitting: *Deleted

## 2015-12-17 DIAGNOSIS — N185 Chronic kidney disease, stage 5: Secondary | ICD-10-CM

## 2015-12-17 DIAGNOSIS — E875 Hyperkalemia: Secondary | ICD-10-CM

## 2015-12-17 LAB — CMP14+EGFR
ALBUMIN: 3.1 g/dL — AB (ref 3.5–5.5)
ALT: 7 IU/L (ref 0–44)
AST: 15 IU/L (ref 0–40)
Albumin/Globulin Ratio: 1.3 (ref 1.2–2.2)
Alkaline Phosphatase: 109 IU/L (ref 39–117)
BUN / CREAT RATIO: 8 — AB (ref 9–20)
BUN: 45 mg/dL — ABNORMAL HIGH (ref 6–24)
Bilirubin Total: <0.2 mg/dL (ref 0.0–1.2)
CALCIUM: 8 mg/dL — AB (ref 8.7–10.2)
CO2: 21 mmol/L (ref 18–29)
CREATININE: 5.65 mg/dL — AB (ref 0.76–1.27)
Chloride: 106 mmol/L (ref 96–106)
GFR calc Af Amer: 13 mL/min/{1.73_m2} — ABNORMAL LOW (ref >59)
GFR calc non Af Amer: 11 mL/min/{1.73_m2} — ABNORMAL LOW (ref >59)
GLOBULIN, TOTAL: 2.4 g/dL (ref 1.5–4.5)
Glucose: 133 mg/dL — ABNORMAL HIGH (ref 65–99)
Potassium: 5.7 mmol/L — ABNORMAL HIGH (ref 3.5–5.2)
SODIUM: 142 mmol/L (ref 134–144)
Total Protein: 5.5 g/dL — ABNORMAL LOW (ref 6.0–8.5)

## 2015-12-21 ENCOUNTER — Ambulatory Visit: Payer: Medicaid Other | Attending: Student | Admitting: Physical Therapy

## 2015-12-21 ENCOUNTER — Encounter: Payer: Self-pay | Admitting: Physical Therapy

## 2015-12-21 DIAGNOSIS — R29898 Other symptoms and signs involving the musculoskeletal system: Secondary | ICD-10-CM | POA: Diagnosis present

## 2015-12-21 DIAGNOSIS — R2681 Unsteadiness on feet: Secondary | ICD-10-CM | POA: Diagnosis present

## 2015-12-21 DIAGNOSIS — R2689 Other abnormalities of gait and mobility: Secondary | ICD-10-CM | POA: Insufficient documentation

## 2015-12-21 NOTE — Therapy (Signed)
[facility] [address] [place], [place], [address] Phone: [phone]   Fax:  [phone]  Physical Therapy Treatment  Patient Details  Name: Brendan Campbell MRN: [id number] Date of Birth: 1970-12-21 Referring Provider: Mechele Claude PA  Encounter Date: 12/21/2015   PHYSICAL THERAPY DISCHARGE SUMMARY  Visits from Start of Care: 9  Current functional level related to goals / functional outcomes: See below   Remaining deficits: See below   Education / Equipment: Prosthetic care  Plan: Patient agrees to discharge.  Patient goals were partially met. Patient is being discharged due to meeting the stated rehab goals.  ?????              PT End of Session - 12/21/15 1320    Visit Number 9   Number of Visits 9   Authorization Type Medicaid   Authorization Time Period 08/05/15- 11/24/15; extension thru 12/22/2015   Authorization - Visit Number 8   Authorization - Number of Visits 8   PT Start Time 9735   PT Stop Time 1302   PT Time Calculation (min) 31 min   Equipment Utilized During Treatment --   Activity Tolerance Patient tolerated treatment well   Behavior During Therapy WFL for tasks assessed/performed      Past Medical History:  Diagnosis Date  . Anemia   . Anxiety   . Blood transfusion without reported diagnosis   . Depression   . Diabetes mellitus without complication ([place])   . Hyperlipidemia   . Neuropathy ([place])   . PTSD (post-traumatic stress disorder)     Past Surgical History:  Procedure Laterality Date  . CHOLECYSTECTOMY    . FOOT SURGERY      There were no vitals filed for this visit.      Subjective Assessment - 12/21/15 1230    Subjective He saw prosthetist Thursday 4 days ago and he changed height & pads. No falls. He is going in community again without issues.    Pertinent History LBP, Chronic renal stage 4, DM2, neuropathy, suicide attempt (april 2016), neuropathy, 1st and  2nd toe amputation LLE   Limitations Lifting;Standing;Walking   Patient Stated Goals Walking with prosthesis in home & community.   Currently in Pain? No/denies            [facility] PT Assessment - 12/21/15 1230      Ambulation/Gait   Ambulation/Gait Yes   Ambulation/Gait Assistance 5: Supervision   Ambulation Distance (Feet) 500 Feet   Assistive device Prosthesis;Straight cane   Gait Pattern Decreased stance time - right;Decreased step length - left;Decreased stride length;Trunk flexed;Trendelenburg;Poor foot clearance - right;Right hip hike;Right circumduction;Decreased weight shift to right;Decreased hip/knee flexion - right;Abducted- right;Step-through pattern   Ambulation Surface Indoor;Level   Gait velocity 2.21 ft/sec comfortable, 2.37 ft/sec fast  initial was 0.67 ft/sec   Stairs Yes   Stairs Assistance 6: Modified independent (Device/Increase time)   Stair Management Technique Two rails;One rail Right;With cane;Alternating pattern;Step to pattern;Forwards  alternating 2 rails & step-to descent 1 rail & cane   Number of Stairs 4  2 reps   Ramp 6: Modified independent (Device)  with cane & prosthesis   Curb 6: Modified independent (Device/increase time)  with cane & prosthesis     Standardized Balance Assessment   Standardized Balance Assessment Dynamic Gait Index     Berg Balance Test   Sit to Stand Able to stand  independently using hands   Standing Unsupported Able to stand 30 seconds unsupported  Sitting with Back Unsupported but Feet Supported on Floor or Stool Able to sit safely and securely 2 minutes   Stand to Sit Controls descent by using hands   Transfers Able to transfer safely, definite need of hands   Standing Unsupported with Eyes Closed Able to stand 3 seconds   Standing Ubsupported with Feet Together Needs help to attain position but able to stand for 30 seconds with feet together   From Standing, Reach Forward with Outstretched Arm Can reach forward >5  cm safely (2")   From Standing Position, Pick up Object from Floor Able to pick up shoe, needs supervision   From Standing Position, Turn to Look Behind Over each Shoulder Turn sideways only but maintains balance   Turn 360 Degrees Needs close supervision or verbal cueing   Standing Unsupported, Alternately Place Feet on Step/Stool Needs assistance to keep from falling or unable to try   Standing Unsupported, One Foot in Front Loses balance while stepping or standing   Standing on One Leg Unable to try or needs assist to prevent fall   Total Score 26   Berg comment: Initial score was 14/56     Dynamic Gait Index   Level Surface Mild Impairment   Change in Gait Speed Moderate Impairment   Gait with Horizontal Head Turns Moderate Impairment   Gait with Vertical Head Turns Moderate Impairment   Gait and Pivot Turn Moderate Impairment   Step Over Obstacle Moderate Impairment   Step Around Obstacles Moderate Impairment   Steps Moderate Impairment   Total Score 9         Prosthetics Assessment - 12/21/15 1230      Prosthetics   Prosthetic Care Independent with Skin check;Residual limb care;Care of non-amputated limb;Prosthetic cleaning;Ply sock cleaning;Correct ply sock adjustment;Proper wear schedule/adjustment;Proper weight-bearing schedule/adjustment   Prosthetic Care Comments  continue follow-up with prosthetist and wear all awake hours including when ill   Donning prosthesis  Modified independent (Device/Increase time)   Doffing prosthesis  Modified independent (Device/Increase time)   Current prosthetic wear tolerance (days/week)  daily   Current prosthetic wear tolerance (#hours/day)  all awake hours, drying as needed with sweating   Current prosthetic weight-bearing tolerance (hours/day)  pt tolerated 15 minutes of standing / gait without pain or limb discomfort   Edema none   Residual limb condition  No open areas, cylinerical, normal color, temperature & moisture.   K  code/activity level with prosthetic use  K2 community level with fixed cadence                              PT Short Term Goals - 11/11/15 1606      PT SHORT TERM GOAL #1   Title Pt will wear prosthesis >8 hours total /day with no skin issues.   (Target Date: 4th treatment after evaluation)   Baseline 11/11/2015 MET   Status Achieved     PT SHORT TERM GOAL #2   Title Patient demonstrates proper donning and verbalizes proper cleaning of prosthesis & residual limb.  (Target Date: 4th treatment after evaluation)   Baseline MET 11/11/2015   Status Achieved     PT SHORT TERM GOAL #3   Title Pt will be able to ambulate 300' with supervision and LRAD & prosthesis.  (Target Date: 4th treatment after evaluation)   Baseline MET 11/11/2015 with single point cane & prosthesis   Status Achieved     PT SHORT  TERM GOAL #4   Title Pt will be able to negogiate ramps, curbs, stairs with supervision and LRAD & prosthesis.  (Target Date: 4th treatment after evaluation)   Baseline MET 11/11/2015 with single point cane on ramps & curbs and rail/cane on stairs.    Status Achieved     PT SHORT TERM GOAL #5   Title Pt will be able to reach 5" in all directions & to floor without UE support with supervision. (Target Date: 4th treatment after evaluation)   Baseline MET 11/11/2015   Status Achieved           PT Long Term Goals - 12/21/15 1321      PT LONG TERM GOAL #1   Title Pt will be independent with prosthetic and residual limb care to enable safe use of prosthesis (Target Date: 8th treatment after evaluation)   Baseline MET 12/21/2015   Status Achieved     PT LONG TERM GOAL #2   Title Pt will tolerate wear of prosthesis >90% of all awake hours to enable function throughout his awake hours.  (Target Date: 8th treatment after evaluation)   Baseline met 12/21/2015   Status Achieved     PT LONG TERM GOAL #3   Title Pt will be able to ambulate 500' including grass surfaces  modified independent with LRAD & prosthesis to enable increased mobility in the community  (Target Date: 8th treatment after evaluation)   Baseline met 12/21/2015   Status Achieved     PT LONG TERM GOAL #4   Title Pt will be able to negotiate ramps, stairs and curbs modified independent with LRAD & prosthesis to enable safety with community access  (Target Date: 8th treatment after evaluation)   Baseline met 12/21/2015   Status Achieved     PT LONG TERM GOAL #5   Title Pt Berg Balance Score will increase to > 30/56 to indicate decreased fall risk  (Target Date: 8th treatment after evaluation)   Baseline Partially MET 12/21/2015 Berg Balance improved to 26/56 from 14/56 but not to goal of 30   Status Partially Met               Plan - 12/21/15 1323    Clinical Impression Statement Patient appears to be functioning at community level with prosthesis and cane safely. He appears to understand prosthetic care and need to follow-up with prosthetist. Merrilee Jansky Balance score of 26/56 still indicates fall risk and he verbalizes understanding of fall prevention strategies.    Rehab Potential Good   PT Frequency 1x / week   PT Duration 8 weeks   PT Treatment/Interventions Passive range of motion;ADLs/Self Care Home Management;Therapeutic exercise;Therapeutic activities;Stair training;Gait training;DME Instruction;Functional mobility training;Balance training;Neuromuscular re-education;Patient/family education;Prosthetic Training;Manual techniques;Scar mobilization   PT Next Visit Plan discharge   Consulted and Agree with Plan of Care Patient      Patient will benefit from skilled therapeutic intervention in order to improve the following deficits and impairments:  Decreased activity tolerance, Decreased balance, Decreased mobility, Decreased strength, Postural dysfunction, Prosthetic Dependency, Impaired flexibility, Decreased skin integrity, Decreased knowledge of use of DME, Abnormal gait,  Decreased endurance  Visit Diagnosis: Unsteadiness on feet  Other abnormalities of gait and mobility  Other symptoms and signs involving the musculoskeletal system     Problem List Patient Active Problem List   Diagnosis Date Noted  . S/P BKA (below knee amputation) unilateral ([place]) 03/05/2015  . Chronic renal insufficiency, stage IV (severe) ([place]) 03/04/2015  . DM2 (diabetes mellitus,  type 2) ([place]) 03/03/2015  . HLD (hyperlipidemia) 03/03/2015  . Back pain 03/03/2015    Jamey Reas PT, DPT 12/21/2015, 1:26 PM  [facility] [address] [place], [place], [address] Phone: [phone]   Fax:  [phone]  Name: Brendan Campbell MRN: [id number] Date of Birth: [birth year]

## 2016-01-06 ENCOUNTER — Telehealth: Payer: Self-pay | Admitting: Family Medicine

## 2016-01-15 ENCOUNTER — Telehealth: Payer: Self-pay | Admitting: Family Medicine

## 2016-01-21 ENCOUNTER — Telehealth: Payer: Self-pay | Admitting: Family Medicine

## 2016-01-21 NOTE — Telephone Encounter (Signed)
Pt is not appropriate for narcotic prescribing in our clinic. Unfortunately he will need to wait for an appt at pain management.   Laroy Apple, MD [facility] 01/21/2016, 12:12 PM

## 2016-01-21 NOTE — Telephone Encounter (Signed)
Pt aware.

## 2016-02-22 ENCOUNTER — Emergency Department (HOSPITAL_COMMUNITY)
Admission: EM | Admit: 2016-02-22 | Discharge: 2016-02-22 | Disposition: A | Discharge status: Home / Self Care | Payer: Medicaid Other | Attending: Emergency Medicine | Admitting: Emergency Medicine

## 2016-02-22 ENCOUNTER — Encounter (HOSPITAL_COMMUNITY): Payer: Self-pay | Admitting: Emergency Medicine

## 2016-02-22 ENCOUNTER — Emergency Department (HOSPITAL_COMMUNITY): Payer: Medicaid Other

## 2016-02-22 DIAGNOSIS — N186 End stage renal disease: Secondary | ICD-10-CM | POA: Diagnosis not present

## 2016-02-22 DIAGNOSIS — R062 Wheezing: Secondary | ICD-10-CM | POA: Diagnosis not present

## 2016-02-22 DIAGNOSIS — R079 Chest pain, unspecified: Secondary | ICD-10-CM | POA: Diagnosis not present

## 2016-02-22 DIAGNOSIS — E1140 Type 2 diabetes mellitus with diabetic neuropathy, unspecified: Secondary | ICD-10-CM | POA: Insufficient documentation

## 2016-02-22 DIAGNOSIS — Z794 Long term (current) use of insulin: Secondary | ICD-10-CM | POA: Insufficient documentation

## 2016-02-22 DIAGNOSIS — J988 Other specified respiratory disorders: Secondary | ICD-10-CM

## 2016-02-22 DIAGNOSIS — R0602 Shortness of breath: Secondary | ICD-10-CM | POA: Diagnosis present

## 2016-02-22 LAB — CBC WITH DIFFERENTIAL/PLATELET
Basophils Absolute: 0 10*3/uL (ref 0.0–0.1)
Basophils Relative: 0 %
EOS ABS: 0.4 10*3/uL (ref 0.0–0.7)
Eosinophils Relative: 4 %
HEMATOCRIT: 25.5 % — AB (ref 39.0–52.0)
HEMOGLOBIN: 8.2 g/dL — AB (ref 13.0–17.0)
LYMPHS ABS: 2.1 10*3/uL (ref 0.7–4.0)
LYMPHS PCT: 24 %
MCH: 30 pg (ref 26.0–34.0)
MCHC: 32.2 g/dL (ref 30.0–36.0)
MCV: 93.4 fL (ref 78.0–100.0)
Monocytes Absolute: 0.4 10*3/uL (ref 0.1–1.0)
Monocytes Relative: 4 %
NEUTROS ABS: 6 10*3/uL (ref 1.7–7.7)
NEUTROS PCT: 68 %
Platelets: 264 10*3/uL (ref 150–400)
RBC: 2.73 MIL/uL — AB (ref 4.22–5.81)
RDW: 13.9 % (ref 11.5–15.5)
WBC: 8.9 10*3/uL (ref 4.0–10.5)

## 2016-02-22 LAB — BASIC METABOLIC PANEL
Anion gap: 9 (ref 5–15)
BUN: 53 mg/dL — ABNORMAL HIGH (ref 6–20)
CHLORIDE: 113 mmol/L — AB (ref 101–111)
CO2: 19 mmol/L — ABNORMAL LOW (ref 22–32)
Calcium: 6.5 mg/dL — ABNORMAL LOW (ref 8.9–10.3)
Creatinine, Ser: 6.94 mg/dL — ABNORMAL HIGH (ref 0.61–1.24)
GFR calc non Af Amer: 9 mL/min — ABNORMAL LOW (ref >60)
GFR, EST AFRICAN AMERICAN: 10 mL/min — AB (ref >60)
Glucose, Bld: 134 mg/dL — ABNORMAL HIGH (ref 65–99)
POTASSIUM: 5.8 mmol/L — AB (ref 3.5–5.1)
SODIUM: 141 mmol/L (ref 135–145)

## 2016-02-22 LAB — BRAIN NATRIURETIC PEPTIDE: B Natriuretic Peptide: 248.4 pg/mL — ABNORMAL HIGH (ref 0.0–100.0)

## 2016-02-22 LAB — I-STAT TROPONIN, ED
TROPONIN I, POC: 0.02 ng/mL (ref 0.00–0.08)
TROPONIN I, POC: 0.02 ng/mL (ref 0.00–0.08)

## 2016-02-22 IMAGING — CR DG CHEST 2V
2 series · 2 of 2 positions shown · non-contrast
Comparison: [DATE].

CLINICAL DATA: 45-year-old diabetic male with left-sided chest
pressure and orthopnea. Anemia. Nonsmoker. Initial encounter.

EXAM:
CHEST  2 VIEW

[chest lat]
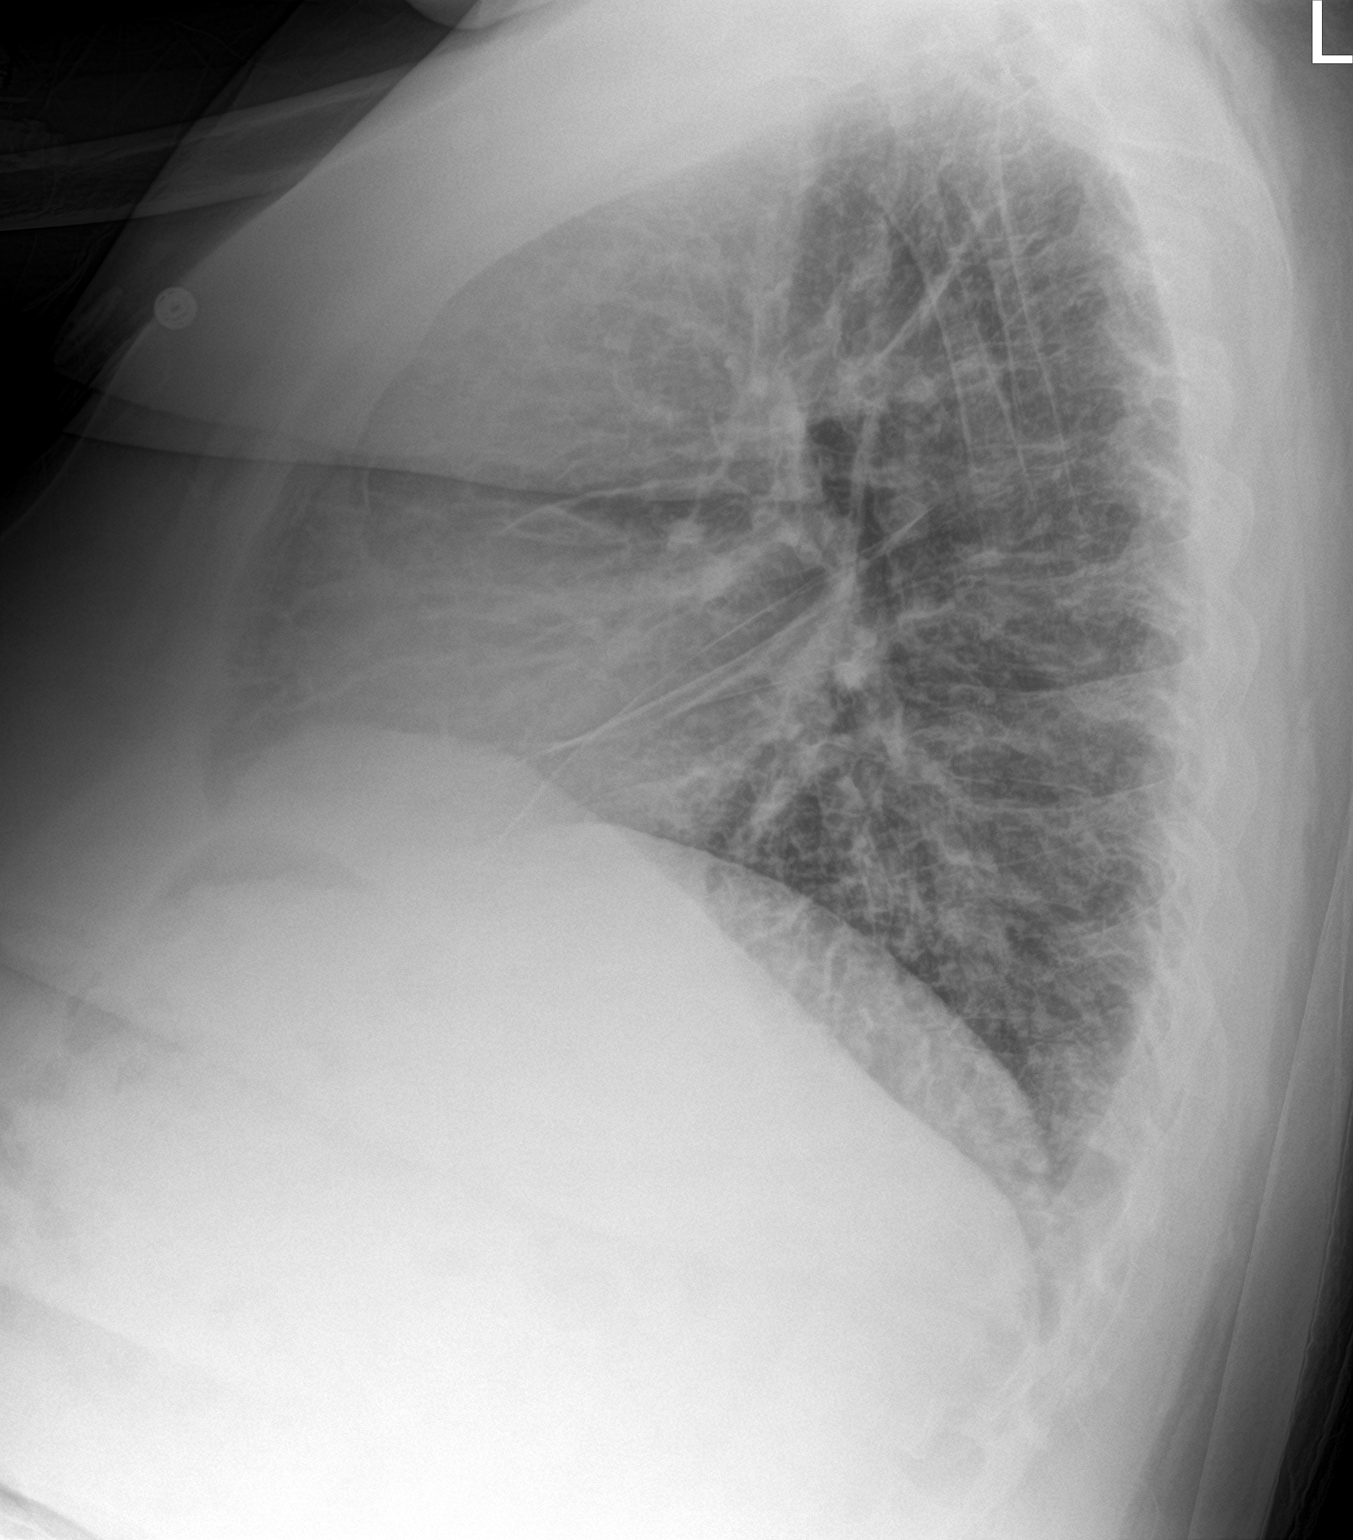

[chest ap]
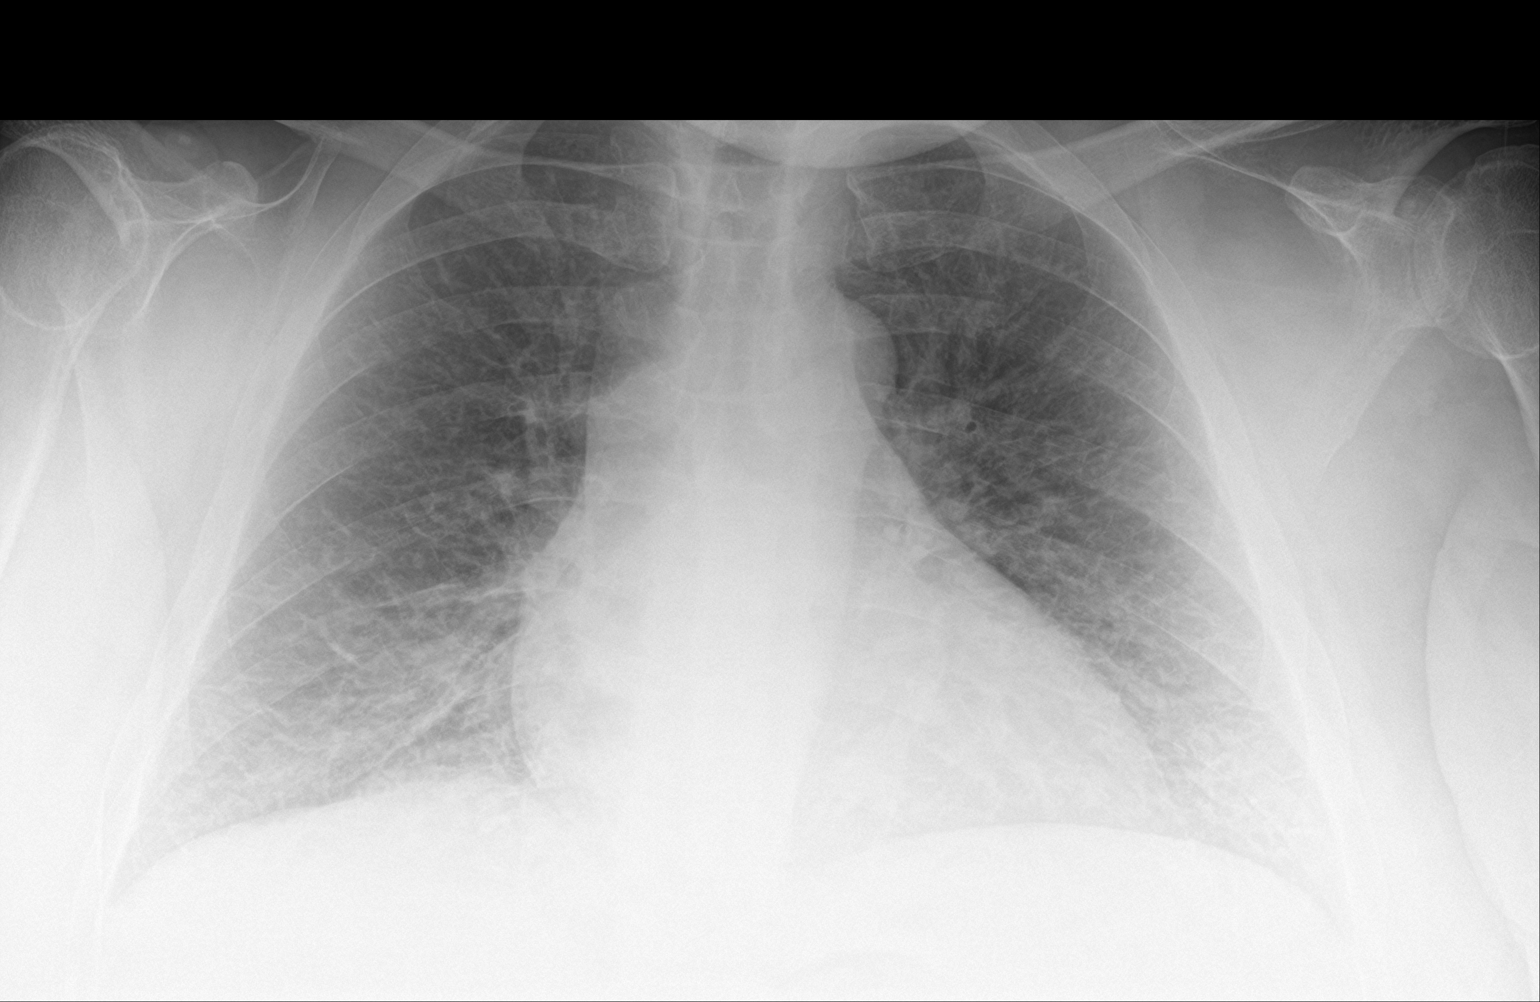

[2 of 2 positions shown; findings below may reference images not displayed]

FINDINGS: Cardiomegaly.

Mild pulmonary edema.

Increased markings lung bases greater on the right most likely
related to the mild pulmonary edema rather than infectious
infiltrate. This can be assessed on follow-up.

No pneumothorax.

No plain film evidence of pulmonary malignancy.

No obvious osseous abnormality.
IMPRESSION: Cardiomegaly.

Mild pulmonary edema.

Increased markings lung bases greater on the right most likely
related to the mild pulmonary edema rather than infectious
infiltrate. This can be assessed on follow-up.

## 2016-02-22 MED ORDER — ALBUTEROL SULFATE HFA 108 (90 BASE) MCG/ACT IN AERS
2.0000 | INHALATION_SPRAY | RESPIRATORY_TRACT | Status: DC | PRN
  Administered 2016-02-22: 2 via RESPIRATORY_TRACT
  Filled 2016-02-22: qty 6.7

## 2016-02-22 MED ORDER — AEROCHAMBER PLUS W/MASK MISC
1.0000 | Freq: Once | Status: AC
  Administered 2016-02-22: 1
  Filled 2016-02-22: qty 1

## 2016-02-22 NOTE — ED Triage Notes (Addendum)
BIB EMS for L sided chest pressure x1 hr and orthopnea; EMS sts diminishd lung sounds, wheeze on R; diaphoretic on scene.  Given 1 nitro and 1 albuterol neb.  Not given ASA due to hx ulcer. EMS reports inferior lead depressions.CBG 162.

## 2016-02-22 NOTE — Discharge Instructions (Signed)
Return to the ED with any concerns including difficulty breathing despite using albuterol every 4 hours, not drinking fluids, decreased urine output, vomiting and not able to keep down liquids or medications, decreased level of alertness/lethargy, or any other alarming symptoms °

## 2016-02-22 NOTE — ED Notes (Signed)
Lab to add on BNP.  °

## 2016-02-22 NOTE — ED Notes (Signed)
Took patient saline lot out patient is waiting for ride

## 2016-02-22 NOTE — ED Provider Notes (Signed)
[facility] DEPT Provider Note   CSN: [id number] Arrival date & time: 02/22/16  6237     History   Chief Complaint Chief Complaint  Patient presents with  . Chest Pain    HPI Marquon Alcala is a 46 y.o. male.  HPI  Pt presenting with c/o shortness of breath.  He states he has had cough and congestion with subjective fever starting 4 days ago.  This morning he states he started having a coughing fit and couldn't catch his breath.  EMS was called- they gave neb treatment which helped his breathing.  Upon my evaluation in the ED he states his breathing is much improved.  No chest pain. No leg swelling.  He did receive his flu vaccination this season.  He is not currently on dialysis but is following with a renal doctor in [place] for this.  There are no other associated systemic symptoms, there are no other alleviating or modifying factors.   Past Medical History:  Diagnosis Date  . Anemia   . Anxiety   . Blood transfusion without reported diagnosis   . Depression   . Diabetes mellitus without complication ([place])   . Hyperlipidemia   . Neuropathy ([place])   . PTSD (post-traumatic stress disorder)     Patient Active Problem List   Diagnosis Date Noted  . S/P BKA (below knee amputation) unilateral ([place]) 03/05/2015  . Chronic renal insufficiency, stage IV (severe) ([place]) 03/04/2015  . DM2 (diabetes mellitus, type 2) ([place]) 03/03/2015  . HLD (hyperlipidemia) 03/03/2015  . Back pain 03/03/2015    Past Surgical History:  Procedure Laterality Date  . BELOW KNEE LEG AMPUTATION Right   . CHOLECYSTECTOMY    . FOOT SURGERY         Home Medications    Prior to Admission medications   Medication Sig Start Date End Date Taking? Authorizing Provider  Insulin Glargine (LANTUS SOLOSTAR) 100 UNIT/ML Solostar Pen Inject 10-15 Units into the skin daily at 10 pm. Patient taking differently: Inject 20 Units into the skin every evening.  12/16/15  Yes Timmothy Euler, MD  atorvastatin  (LIPITOR) 10 MG tablet Take 1 tablet (10 mg total) by mouth every evening. Patient not taking: Reported on 02/22/2016 03/05/15   Timmothy Euler, MD  Insulin Pen Needle 31G X 5 MM MISC 1 Device by Does not apply route daily. 04/03/15   Timmothy Euler, MD  sodium polystyrene (KAYEXALATE) 15 GM/60ML suspension Take 60 mLs (15 g total) by mouth 3 (three) times daily. Patient not taking: Reported on 02/22/2016 09/17/15   Timmothy Euler, MD    Family History Family History  Problem Relation Age of Onset  . Cancer Mother     lung  . Diabetes Mother   . Heart attack Father   . Diabetes Father   . Diabetes Sister     Social History Social History  Substance Use Topics  . Smoking status: Never Smoker  . Smokeless tobacco: Never Used  . Alcohol use No     Allergies   Patient has no known allergies.   Review of Systems Review of Systems  ROS reviewed and all otherwise negative except for mentioned in HPI   Physical Exam Updated Vital Signs BP 145/87 (BP Location: Right Arm)   Pulse 102   Temp 97.6 F (36.4 C) (Oral)   Resp 24   Ht 6' (1.829 m)   Wt 108.9 kg   SpO2 100%   BMI 32.55 kg/m  Vitals reviewed  Physical Exam Physical Examination: General appearance - alert, well appearing, and in no distress Mental status - alert, oriented to person, place, and time Eyes -no conjunctival injection, no scleral icterus Mouth - mucous membranes moist, pharynx normal without lesions Neck - supple, no significant adenopathy Chest - clear to auscultation, no wheezes, rales or rhonchi, symmetric air entry, no wheezing after breathing treatment- symptoms have resolved Heart - normal rate, regular rhythm, normal S1, S2, no murmurs, rubs, clicks or gallops Abdomen - soft, nontender, nondistended, no masses or organomegaly Neurological - alert, oriented x 3 Extremities - peripheral pulses normal, no pedal edema, no clubbing or cyanosis, prosthetic leg in place Skin - normal coloration  and turgor, no rashes  ED Treatments / Results  Labs (all labs ordered are listed, but only abnormal results are displayed) Labs Reviewed  BASIC METABOLIC PANEL - Abnormal; Notable for the following:       Result Value   Potassium 5.8 (*)    Chloride 113 (*)    CO2 19 (*)    Glucose, Bld 134 (*)    BUN 53 (*)    Creatinine, Ser 6.94 (*)    Calcium 6.5 (*)    GFR calc non Af Amer 9 (*)    GFR calc Af Amer 10 (*)    All other components within normal limits  CBC WITH DIFFERENTIAL/PLATELET - Abnormal; Notable for the following:    RBC 2.73 (*)    Hemoglobin 8.2 (*)    HCT 25.5 (*)    All other components within normal limits  BRAIN NATRIURETIC PEPTIDE - Abnormal; Notable for the following:    B Natriuretic Peptide 248.4 (*)    All other components within normal limits  I-STAT TROPOININ, ED  I-STAT TROPOININ, ED    EKG  EKG Interpretation  Date/Time:  Monday February 22 2016 06:33:48 EST Ventricular Rate:  104 PR Interval:    QRS Duration: 101 QT Interval:  374 QTC Calculation: 492 R Axis:   -34 Text Interpretation:  Sinus tachycardia Left ventricular hypertrophy Anterior Q waves, possibly due to LVH Baseline wander in lead(s) II III aVF No previous ECGs available Interpretation limited secondary to artifact Abnormal ECG Confirmed by Christy Gentles  MD, DONALD ([id number]) on 02/22/2016 6:38:08 AM       Radiology Dg Chest 2 View  Result Date: 02/22/2016 CLINICAL DATA:  46 year old diabetic male with left-sided chest pressure and orthopnea. Anemia. Nonsmoker. Initial encounter. EXAM: CHEST  2 VIEW COMPARISON:  10/02/2015. FINDINGS: Cardiomegaly. Mild pulmonary edema. Increased markings lung bases greater on the right most likely related to the mild pulmonary edema rather than infectious infiltrate. This can be assessed on follow-up. No pneumothorax. No plain film evidence of pulmonary malignancy. No obvious osseous abnormality. IMPRESSION: Cardiomegaly. Mild pulmonary edema. Increased  markings lung bases greater on the right most likely related to the mild pulmonary edema rather than infectious infiltrate. This can be assessed on follow-up. Electronically Signed   By: Genia Del M.D.   On: 02/22/2016 08:06    Procedures Procedures (including critical care time)  Medications Ordered in ED Medications  albuterol (PROVENTIL HFA;VENTOLIN HFA) 108 (90 Base) MCG/ACT inhaler 2 puff (2 puffs Inhalation Given 02/22/16 0849)  aerochamber plus with mask device 1 each (1 each Other Given 02/22/16 0850)     Initial Impression / Assessment and Plan / ED Course  I have reviewed the triage vital signs and the nursing notes.  Pertinent labs & imaging results that were available during my care  of the patient were reviewed by me and considered in my medical decision making (see chart for details).  Clinical Course   7:33 AM went to see patient, he is being taken to xray  9:13 AM renal function labs are continuing to worsen.  He is known to have stage IV kidney disease- per notes and per patient he has been resistant to having fistula placed for dialysis.  I have called Dr. Florentina Addison office- renal who he follows with- they will get him in for an appointment tomorrow 02/23/16 at 12:30pm at [facility] office.    Pt given albuterol MDI for wheezing/bronchospasm for home use- no pneumonia on CXR.  Pt has increased BUN/creatinine- I have discussed this with him as well as called nephrology in [place] as above.  Pt is agreeable with plan to f/u tomorrow with nephrology- he verbalizes understanding of how importance this is and that he will likely need to soon be started on dialysis.  Discharged with strict return precautions.  Pt agreeable with plan.  Final Clinical Impressions(s) / ED Diagnoses   Final diagnoses:  End stage renal disease ([place])  Wheezing-associated respiratory infection ([facility])  Nonspecific chest pain    New Prescriptions Discharge Medication List as of 02/22/2016 10:43 AM        Alfonzo Beers, MD 02/22/16 1438

## 2016-02-22 NOTE — Discharge Planning (Signed)
Pt up for discharge. EDCM reviewed chart for possible CM needs.  No needs identified or communicated.  

## 2016-02-29 ENCOUNTER — Ambulatory Visit: Payer: 59 | Admitting: Physical Medicine & Rehabilitation

## 2016-03-08 ENCOUNTER — Encounter: Payer: Self-pay | Admitting: Physical Medicine & Rehabilitation

## 2016-03-08 ENCOUNTER — Encounter: Payer: Medicaid Other | Attending: Physical Medicine & Rehabilitation | Admitting: Physical Medicine & Rehabilitation

## 2016-03-08 DIAGNOSIS — E1342 Other specified diabetes mellitus with diabetic polyneuropathy: Secondary | ICD-10-CM

## 2016-03-08 DIAGNOSIS — F4310 Post-traumatic stress disorder, unspecified: Secondary | ICD-10-CM | POA: Insufficient documentation

## 2016-03-08 DIAGNOSIS — E1140 Type 2 diabetes mellitus with diabetic neuropathy, unspecified: Secondary | ICD-10-CM | POA: Diagnosis not present

## 2016-03-08 DIAGNOSIS — M5136 Other intervertebral disc degeneration, lumbar region: Secondary | ICD-10-CM | POA: Diagnosis not present

## 2016-03-08 DIAGNOSIS — Z89511 Acquired absence of right leg below knee: Secondary | ICD-10-CM | POA: Diagnosis not present

## 2016-03-08 DIAGNOSIS — E119 Type 2 diabetes mellitus without complications: Secondary | ICD-10-CM | POA: Insufficient documentation

## 2016-03-08 DIAGNOSIS — N189 Chronic kidney disease, unspecified: Secondary | ICD-10-CM | POA: Insufficient documentation

## 2016-03-08 DIAGNOSIS — M5416 Radiculopathy, lumbar region: Secondary | ICD-10-CM | POA: Diagnosis not present

## 2016-03-08 DIAGNOSIS — M2170 Unequal limb length (acquired), unspecified site: Secondary | ICD-10-CM | POA: Insufficient documentation

## 2016-03-08 DIAGNOSIS — M47816 Spondylosis without myelopathy or radiculopathy, lumbar region: Secondary | ICD-10-CM | POA: Diagnosis not present

## 2016-03-08 DIAGNOSIS — M545 Low back pain: Secondary | ICD-10-CM | POA: Diagnosis present

## 2016-03-08 DIAGNOSIS — E785 Hyperlipidemia, unspecified: Secondary | ICD-10-CM | POA: Diagnosis not present

## 2016-03-08 DIAGNOSIS — E1122 Type 2 diabetes mellitus with diabetic chronic kidney disease: Secondary | ICD-10-CM | POA: Diagnosis not present

## 2016-03-08 DIAGNOSIS — N184 Chronic kidney disease, stage 4 (severe): Secondary | ICD-10-CM

## 2016-03-08 DIAGNOSIS — F329 Major depressive disorder, single episode, unspecified: Secondary | ICD-10-CM | POA: Diagnosis not present

## 2016-03-08 DIAGNOSIS — G8929 Other chronic pain: Secondary | ICD-10-CM | POA: Insufficient documentation

## 2016-03-08 MED ORDER — BACLOFEN 10 MG PO TABS: 10.0000 mg | ORAL_TABLET | Freq: Three times a day (TID) | ORAL | 3 refills | Status: DC | PRN

## 2016-03-08 NOTE — Patient Instructions (Addendum)
Address: [address], [place], [place] [address]  Hours:  Open ? Closes 7PM                       Phone: [phone]    Low Back Sprain Rehab Ask your health care provider which exercises are safe for you. Do exercises exactly as told by your health care provider and adjust them as directed. It is normal to feel mild stretching, pulling, tightness, or discomfort as you do these exercises, but you should stop right away if you feel sudden pain or your pain gets worse. Do not begin these exercises until told by your health care provider. Stretching and range of motion exercises These exercises warm up your muscles and joints and improve the movement and flexibility of your back. These exercises also help to relieve pain, numbness, and tingling. Exercise A: Lumbar rotation 1. Lie on your back on a firm surface and bend your knees. 2. Straighten your arms out to your sides so each arm forms an "L" shape with a side of your body (a 90 degree angle). 3. Slowly move both of your knees to one side of your body until you feel a stretch in your lower back. Try not to let your shoulders move off of the floor. 4. Hold for __________ seconds. 5. Tense your abdominal muscles and slowly move your knees back to the starting position. 6. Repeat this exercise on the other side of your body. Repeat __________ times. Complete this exercise __________ times a day. Exercise B: Prone extension on elbows 1. Lie on your abdomen on a firm surface. 2. Prop yourself up on your elbows. 3. Use your arms to help lift your chest up until you feel a gentle stretch in your abdomen and your lower back.  This will place some of your body weight on your elbows. If this is uncomfortable, try stacking pillows under your chest.  Your hips should stay down, against the surface that you are lying on. Keep your hip and back muscles relaxed. 4. Hold for __________ seconds. 5. Slowly relax your upper body and  return to the starting position. Repeat __________ times. Complete this exercise __________ times a day. Strengthening exercises These exercises build strength and endurance in your back. Endurance is the ability to use your muscles for a long time, even after they get tired. Exercise C: Pelvic tilt 1. Lie on your back on a firm surface. Bend your knees and keep your feet flat. 2. Tense your abdominal muscles. Tip your pelvis up toward the ceiling and flatten your lower back into the floor.  To help with this exercise, you may place a small towel under your lower back and try to push your back into the towel. 3. Hold for __________ seconds. 4. Let your muscles relax completely before you repeat this exercise. Repeat __________ times. Complete this exercise __________ times a day. Exercise D: Alternating arm and leg raises 1. Get on your hands and knees on a firm surface. If you are on a hard floor, you may want to use padding to cushion your knees, such as an exercise mat. 2. Line up your arms and legs. Your hands should be below your shoulders, and your knees should be below your hips. 3. Lift your left leg behind you. At the same time, raise your right arm and straighten it in front of you.  Do not lift your leg higher than your hip.  Do not  lift your arm higher than your shoulder.  Keep your abdominal and back muscles tight.  Keep your hips facing the ground.  Do not arch your back.  Keep your balance carefully, and do not hold your breath. 4. Hold for __________ seconds. 5. Slowly return to the starting position and repeat with your right leg and your left arm. Repeat __________ times. Complete this exercise __________ times a day. Exercise E: Abdominal set with straight leg raise 1. Lie on your back on a firm surface. 2. Bend one of your knees and keep your other leg straight. 3. Tense your abdominal muscles and lift your straight leg up, 4-6 inches (10-15 cm) off the  ground. 4. Keep your abdominal muscles tight and hold for __________ seconds.  Do not hold your breath.  Do not arch your back. Keep it flat against the ground. 5. Keep your abdominal muscles tense as you slowly lower your leg back to the starting position. 6. Repeat with your other leg. Repeat __________ times. Complete this exercise __________ times a day. Posture and body mechanics   Body mechanics refers to the movements and positions of your body while you do your daily activities. Posture is part of body mechanics. Good posture and healthy body mechanics can help to relieve stress in your body's tissues and joints. Good posture means that your spine is in its natural S-curve position (your spine is neutral), your shoulders are pulled back slightly, and your head is not tipped forward. The following are general guidelines for applying improved posture and body mechanics to your everyday activities. Standing   When standing, keep your spine neutral and your feet about hip-width apart. Keep a slight bend in your knees. Your ears, shoulders, and hips should line up.  When you do a task in which you stand in one place for a long time, place one foot up on a stable object that is 2-4 inches (5-10 cm) high, such as a footstool. This helps keep your spine neutral. Sitting  When sitting, keep your spine neutral and keep your feet flat on the floor. Use a footrest, if necessary, and keep your thighs parallel to the floor. Avoid rounding your shoulders, and avoid tilting your head forward.  When working at a desk or a computer, keep your desk at a height where your hands are slightly lower than your elbows. Slide your chair under your desk so you are close enough to maintain good posture.  When working at a computer, place your monitor at a height where you are looking straight ahead and you do not have to tilt your head forward or downward to look at the screen. Resting   When lying down and  resting, avoid positions that are most painful for you.  If you have pain with activities such as sitting, bending, stooping, or squatting (flexion-based activities), lie in a position in which your body does not bend very much. For example, avoid curling up on your side with your arms and knees near your chest (fetal position).  If you have pain with activities such as standing for a long time or reaching with your arms (extension-based activities), lie with your spine in a neutral position and bend your knees slightly. Try the following positions:  Lying on your side with a pillow between your knees.  Lying on your back with a pillow under your knees. Lifting   When lifting objects, keep your feet at least shoulder-width apart and tighten your abdominal muscles.  Bend your knees and hips and keep your spine neutral. It is important to lift using the strength of your legs, not your back. Do not lock your knees straight out.  Always ask for help to lift heavy or awkward objects. This information is not intended to replace advice given to you by your health care provider. Make sure you discuss any questions you have with your health care provider. Document Released: 01/24/2005 Document Revised: 10/01/2015 Document Reviewed: 11/05/2014 Elsevier Interactive Patient Education  2017 [employer].

## 2016-03-08 NOTE — Addendum Note (Signed)
Addended by: Elinor Parkinson I on: 03/08/2016 01:14 PM   Modules accepted: Orders

## 2016-03-08 NOTE — Progress Notes (Signed)
Subjective:    Patient ID: Brendan Campbell, male    DOB: 19-Apr-1970, 46 y.o.   MRN: [id number]  HPI   This is an initial visit for Brendan Campbell (hx of DM 2) who is here today for a 10 year hx of low back pain. He has been to other pain clinics in the past for brief periods while he lived near [place]. He has been in this area for about 1-2 years. He has had injections in the past with poor results. He has used hydrocodone and oxycodone for pain in the past. He had been on tramadol per primary but stopped using per the recommendation of his nephrology. (Chronic kidney disease). He has tried cymbalta, lyrica, gabapentin, amitriptyline   He had therapy before which made him "feel worse". He does stretches at home and tries to walk a good bit as well. He finds he will cramp up if he doesn't stretch enough.   Pain is predominantly in his low back and left buttock. The pain will radiate to the left leg and foot when he sits or if he's walking for longer distances. He develops intermittent spasms on the left and right legs. He has neuropathy in his left lower leg and foot due to his DM.   He had a right BKA in 12/2013 while he was still in [place]. He has a prosthesis and [employer] it for him. His leg fits appropriately. He walks with a cane and is able to move around in the community as he needs to. Sometimes he walks without his cane at home. He does have some discomfort at the end of his stump---he denies breakdown or redness. He just received his prosthesis in June of 2017.  I found an xray of his lumbar spine from November 2017 which showed:  Mild degenerative disc disease centered at L5-S1. No compression fracture nor other acute bony abnormality.   His last MRI was done in [place] in 2014. There are no images to review   Pain Inventory Average Pain 5 Pain Right Now 7 My pain is sharp, stabbing and aching  In the last 24 hours, has pain interfered with the following? General  activity 7 Relation with others 6 Enjoyment of life 6 What TIME of day is your pain at its worst? morning, night Sleep (in general) Poor  Pain is worse with: bending, sitting and standing Pain improves with: rest, heat/ice and medication Relief from Meds: 10  Mobility use a cane how many minutes can you walk? 5-10 ability to climb steps?  yes do you drive?  no Do you have any goals in this area?  no  Function disabled: date disabled n/a Do you have any goals in this area?  no  Neuro/Psych numbness tingling spasms  Prior Studies Any changes since last visit?  no  Physicians involved in your care Any changes since last visit?  no   Family History  Problem Relation Age of Onset  . Cancer Mother     lung  . Diabetes Mother   . Heart attack Father   . Diabetes Father   . Diabetes Sister    Social History   Social History  . Marital status: Single    Spouse name: N/A  . Number of children: N/A  . Years of education: N/A   Social History Main Topics  . Smoking status: Never Smoker  . Smokeless tobacco: Never Used  . Alcohol use No  . Drug use: No  . Sexual  activity: Not Asked   Other Topics Concern  . None   Social History Narrative  . None   Past Surgical History:  Procedure Laterality Date  . BELOW KNEE LEG AMPUTATION Right   . CHOLECYSTECTOMY    . FOOT SURGERY     Past Medical History:  Diagnosis Date  . Anemia   . Anxiety   . Blood transfusion without reported diagnosis   . Depression   . Diabetes mellitus without complication ([place])   . Hyperlipidemia   . Neuropathy ([place])   . PTSD (post-traumatic stress disorder)    BP (!) 149/89 (BP Location: Left Arm, Patient Position: Sitting, Cuff Size: Large)   Pulse 87   Temp 98.4 F (36.9 C) (Oral)   Resp 14   SpO2 96%   Opioid Risk Score:   Fall Risk Score:  `1  Depression screen PHQ 2/9  Depression screen [facility] 2/9 03/08/2016 12/16/2015 09/15/2015 09/07/2015 04/03/2015 03/03/2015  Decreased  Interest 2 0 0 0 1 0  Down, Depressed, Hopeless 1 0 0 0 1 0  PHQ - 2 Score 3 0 0 0 2 0  Altered sleeping 2 - - - 1 -  Tired, decreased energy 1 - - - 0 -  Change in appetite 0 - - - 1 -  Feeling bad or failure about yourself  0 - - - 1 -  Trouble concentrating 0 - - - 0 -  Moving slowly or fidgety/restless 1 - - - 0 -  Suicidal thoughts 0 - - - 0 -  PHQ-9 Score 7 - - - 5 -     Review of Systems  Constitutional: Negative.   HENT: Negative.   Eyes: Negative.   Respiratory: Negative.   Cardiovascular: Negative.   Gastrointestinal: Negative.   Endocrine:       High blood sugar  Genitourinary: Negative.   Musculoskeletal: Negative.   Skin: Negative.   Allergic/Immunologic: Negative.   Neurological: Negative.   Hematological: Negative.   Psychiatric/Behavioral: Negative.   All other systems reviewed and are negative.      Objective:   Physical Exam  General: Alert and oriented x 3, No apparent distress. overweight HEENT: Head is normocephalic, atraumatic, PERRLA, EOMI, sclera anicteric, oral mucosa pink and moist, dentition intact, ext ear canals clear,  Neck: Supple without JVD or lymphadenopathy Heart: Reg rate and rhythm. No murmurs rubs or gallops Chest: CTA bilaterally without wheezes, rales, or rhonchi; no distress Abdomen: Soft, non-tender, non-distended, bowel sounds positive. Extremities: No clubbing, cyanosis, or edema. Pulses are 2+ Skin: Clean and intact without signs of breakdown Neuro: Pt is cognitively appropriate with normal insight, memory, and awareness. Cranial nerves 2-12 are intact. Sensory exam is decreased to LT in LLE to knee and in both hands/fingers. . Reflexes are 1+ in all 4's. Has pain inhibition left HF and KE. Grossly 5/5 ADF/PF. RLE: 4/5 HF and 4-KE. UE motor nealry 5/5 Musculoskeletal: very limited in lumbar ROM. Is tender along L4-S1 as well as both PSIS. Flattening of lordotic curve. Left hemipelvis elevated over right by 1/2". Right BK is  too loose---stump falls into socket. Excessive valgus in prosthetic side. RIght knee almost touches left knee.  Hamstrings are tight bilaterally. SLR positive on left.  Psych: Pt's affect is flat. Appears depressed         Assessment & Plan:  1. 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 Likely lumbar radiculopathy on left 3. Diabetes 2 with peripheral neuropathy  4. Right below knee amputation with prosthesis which appears to be set in excessive valgus, socket too big---has created a leg length discrepancy and tilt of his pelvis 5. 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 Will add baclofen 10mg  q8 prn for spasms.  2. Need MRI 2014 from [facility] in [place], [place]. Consider further imaging and plan based on this MRI 3. UDS was collected. If consistent will consider beginning something narcotic for pain control.  4. Consider course of PT. For now have provided lumbar exercises to start with 5. Needs to visit prosthetist about revision to prosthesis to improve positioning of socket/pilon as well as to replace the socket. The prosthesis is excacerbating his back pain.  6. Follow up with me in about a month. Forty-five minutes of face to face patient care time were spent during this visit. All questions were encouraged and answered.

## 2016-03-09 ENCOUNTER — Telehealth: Payer: Self-pay | Admitting: *Deleted

## 2016-03-09 DIAGNOSIS — M5416 Radiculopathy, lumbar region: Secondary | ICD-10-CM

## 2016-03-09 NOTE — Telephone Encounter (Signed)
Mr Douthit asked for a call back to discuss MRI records.  Left VM to call us back.

## 2016-03-10 NOTE — Telephone Encounter (Signed)
Patient says he is unable to retrieve MRI records from his previous providers.  He is asking [place] to place a new order for an MRI.

## 2016-03-12 LAB — TOXASSURE SELECT,+ANTIDEPR,UR

## 2016-03-18 ENCOUNTER — Ambulatory Visit: Payer: Medicaid Other | Admitting: Family Medicine

## 2016-03-21 ENCOUNTER — Telehealth: Payer: Self-pay | Admitting: Family Medicine

## 2016-03-21 ENCOUNTER — Encounter: Payer: Self-pay | Admitting: Family Medicine

## 2016-03-21 NOTE — Telephone Encounter (Signed)
Left message - to call back and reschedule missed appointment.

## 2016-05-09 ENCOUNTER — Telehealth: Payer: Self-pay | Admitting: Family Medicine

## 2016-05-10 ENCOUNTER — Encounter (HOSPITAL_COMMUNITY): Payer: Self-pay | Admitting: Emergency Medicine

## 2016-05-10 ENCOUNTER — Emergency Department (HOSPITAL_COMMUNITY): Payer: Medicaid Other

## 2016-05-10 ENCOUNTER — Observation Stay (HOSPITAL_COMMUNITY)
Admission: EM | Admit: 2016-05-10 | Discharge: 2016-05-10 | Discharge status: Against Medical Advice | Payer: Medicaid Other | Attending: Nephrology | Admitting: Nephrology

## 2016-05-10 ENCOUNTER — Encounter (HOSPITAL_COMMUNITY)
Admission: EM | Discharge status: Against Medical Advice | Payer: Self-pay | Source: Home / Self Care | Attending: Emergency Medicine

## 2016-05-10 ENCOUNTER — Ambulatory Visit (HOSPITAL_COMMUNITY): Admit: 2016-05-10 | Payer: Medicaid Other | Admitting: Cardiology

## 2016-05-10 DIAGNOSIS — Z794 Long term (current) use of insulin: Secondary | ICD-10-CM | POA: Diagnosis not present

## 2016-05-10 DIAGNOSIS — E785 Hyperlipidemia, unspecified: Secondary | ICD-10-CM

## 2016-05-10 DIAGNOSIS — Z7982 Long term (current) use of aspirin: Secondary | ICD-10-CM | POA: Diagnosis not present

## 2016-05-10 DIAGNOSIS — R0789 Other chest pain: Secondary | ICD-10-CM | POA: Diagnosis not present

## 2016-05-10 DIAGNOSIS — E1151 Type 2 diabetes mellitus with diabetic peripheral angiopathy without gangrene: Secondary | ICD-10-CM | POA: Insufficient documentation

## 2016-05-10 DIAGNOSIS — E1122 Type 2 diabetes mellitus with diabetic chronic kidney disease: Secondary | ICD-10-CM

## 2016-05-10 DIAGNOSIS — D631 Anemia in chronic kidney disease: Secondary | ICD-10-CM | POA: Diagnosis not present

## 2016-05-10 DIAGNOSIS — Z79899 Other long term (current) drug therapy: Secondary | ICD-10-CM | POA: Insufficient documentation

## 2016-05-10 DIAGNOSIS — E1140 Type 2 diabetes mellitus with diabetic neuropathy, unspecified: Secondary | ICD-10-CM | POA: Diagnosis not present

## 2016-05-10 DIAGNOSIS — Z992 Dependence on renal dialysis: Secondary | ICD-10-CM | POA: Diagnosis not present

## 2016-05-10 DIAGNOSIS — I1311 Hypertensive heart and chronic kidney disease without heart failure, with stage 5 chronic kidney disease, or end stage renal disease: Secondary | ICD-10-CM | POA: Insufficient documentation

## 2016-05-10 DIAGNOSIS — E119 Type 2 diabetes mellitus without complications: Secondary | ICD-10-CM

## 2016-05-10 DIAGNOSIS — N186 End stage renal disease: Secondary | ICD-10-CM

## 2016-05-10 DIAGNOSIS — Z8249 Family history of ischemic heart disease and other diseases of the circulatory system: Secondary | ICD-10-CM | POA: Diagnosis not present

## 2016-05-10 DIAGNOSIS — N2581 Secondary hyperparathyroidism of renal origin: Secondary | ICD-10-CM | POA: Insufficient documentation

## 2016-05-10 DIAGNOSIS — R079 Chest pain, unspecified: Secondary | ICD-10-CM

## 2016-05-10 DIAGNOSIS — Z89511 Acquired absence of right leg below knee: Secondary | ICD-10-CM | POA: Diagnosis not present

## 2016-05-10 LAB — BASIC METABOLIC PANEL
ANION GAP: 15 (ref 5–15)
BUN: 46 mg/dL — ABNORMAL HIGH (ref 6–20)
CALCIUM: 7.2 mg/dL — AB (ref 8.9–10.3)
CO2: 22 mmol/L (ref 22–32)
CREATININE: 9.4 mg/dL — AB (ref 0.61–1.24)
Chloride: 100 mmol/L — ABNORMAL LOW (ref 101–111)
GFR, EST AFRICAN AMERICAN: 7 mL/min — AB (ref >60)
GFR, EST NON AFRICAN AMERICAN: 6 mL/min — AB (ref >60)
GLUCOSE: 92 mg/dL (ref 65–99)
Potassium: 4.1 mmol/L (ref 3.5–5.1)
Sodium: 137 mmol/L (ref 135–145)

## 2016-05-10 LAB — CBC WITH DIFFERENTIAL/PLATELET
BASOS ABS: 0.1 10*3/uL (ref 0.0–0.1)
Basophils Relative: 1 %
EOS PCT: 6 %
Eosinophils Absolute: 0.5 10*3/uL (ref 0.0–0.7)
HEMATOCRIT: 27.4 % — AB (ref 39.0–52.0)
Hemoglobin: 8.8 g/dL — ABNORMAL LOW (ref 13.0–17.0)
Lymphocytes Relative: 25 %
Lymphs Abs: 2 10*3/uL (ref 0.7–4.0)
MCH: 29.7 pg (ref 26.0–34.0)
MCHC: 32.1 g/dL (ref 30.0–36.0)
MCV: 92.6 fL (ref 78.0–100.0)
MONO ABS: 0.9 10*3/uL (ref 0.1–1.0)
MONOS PCT: 10 %
Neutro Abs: 4.7 10*3/uL (ref 1.7–7.7)
Neutrophils Relative %: 58 %
PLATELETS: 306 10*3/uL (ref 150–400)
RBC: 2.96 MIL/uL — ABNORMAL LOW (ref 4.22–5.81)
RDW: 14.1 % (ref 11.5–15.5)
WBC: 8.2 10*3/uL (ref 4.0–10.5)

## 2016-05-10 LAB — GLUCOSE, CAPILLARY
GLUCOSE-CAPILLARY: 96 mg/dL (ref 65–99)
GLUCOSE-CAPILLARY: 106 mg/dL — AB (ref 65–99)
Glucose-Capillary: 74 mg/dL (ref 65–99)

## 2016-05-10 LAB — RAPID URINE DRUG SCREEN, HOSP PERFORMED
Amphetamines: NOT DETECTED
BENZODIAZEPINES: NOT DETECTED
Barbiturates: NOT DETECTED
COCAINE: NOT DETECTED
OPIATES: POSITIVE — AB
Tetrahydrocannabinol: NOT DETECTED

## 2016-05-10 LAB — LIPID PANEL
CHOL/HDL RATIO: 3.5 ratio
Cholesterol: 139 mg/dL (ref 0–200)
HDL: 40 mg/dL — AB (ref >40)
LDL Cholesterol: 72 mg/dL (ref 0–99)
Triglycerides: 137 mg/dL (ref <150)
VLDL: 27 mg/dL (ref 0–40)

## 2016-05-10 LAB — I-STAT TROPONIN, ED: Troponin i, poc: 0.03 ng/mL (ref 0.00–0.08)

## 2016-05-10 LAB — TROPONIN I
TROPONIN I: 0.06 ng/mL — AB (ref <0.03)
TROPONIN I: 0.04 ng/mL — AB (ref <0.03)
Troponin I: 0.05 ng/mL (ref <0.03)
Troponin I: 0.06 ng/mL (ref <0.03)

## 2016-05-10 LAB — HIV ANTIBODY (ROUTINE TESTING W REFLEX): HIV Screen 4th Generation wRfx: NONREACTIVE

## 2016-05-10 LAB — MRSA PCR SCREENING: MRSA BY PCR: NEGATIVE

## 2016-05-10 IMAGING — DX DG CHEST 2V
2 series · 2 of 2 positions shown · non-contrast
Comparison: [DATE] chest radiograph.

CLINICAL DATA: 46 y/o  M; right-sided chest pain.

EXAM:
CHEST  2 VIEW

[chest pa]
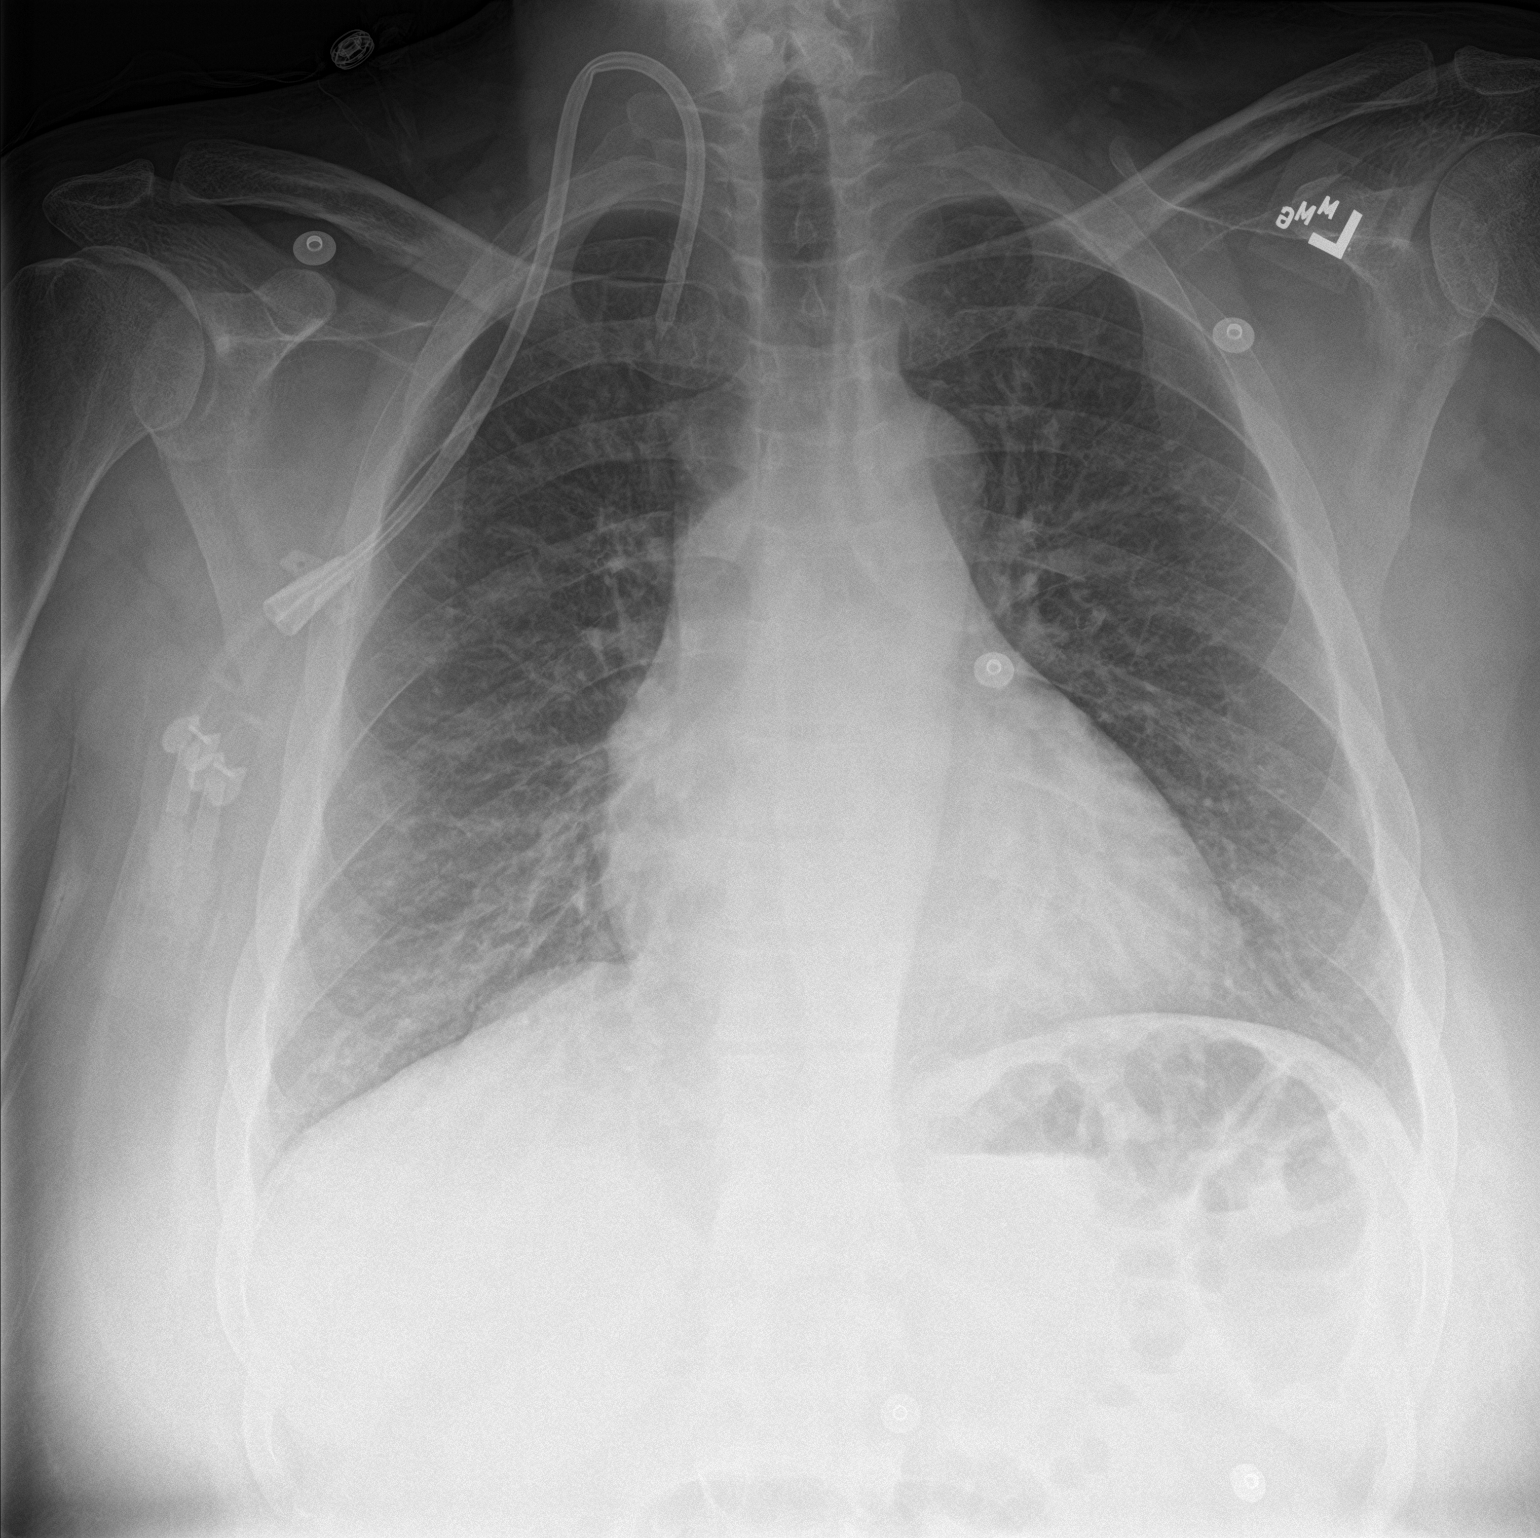

[chest lat]
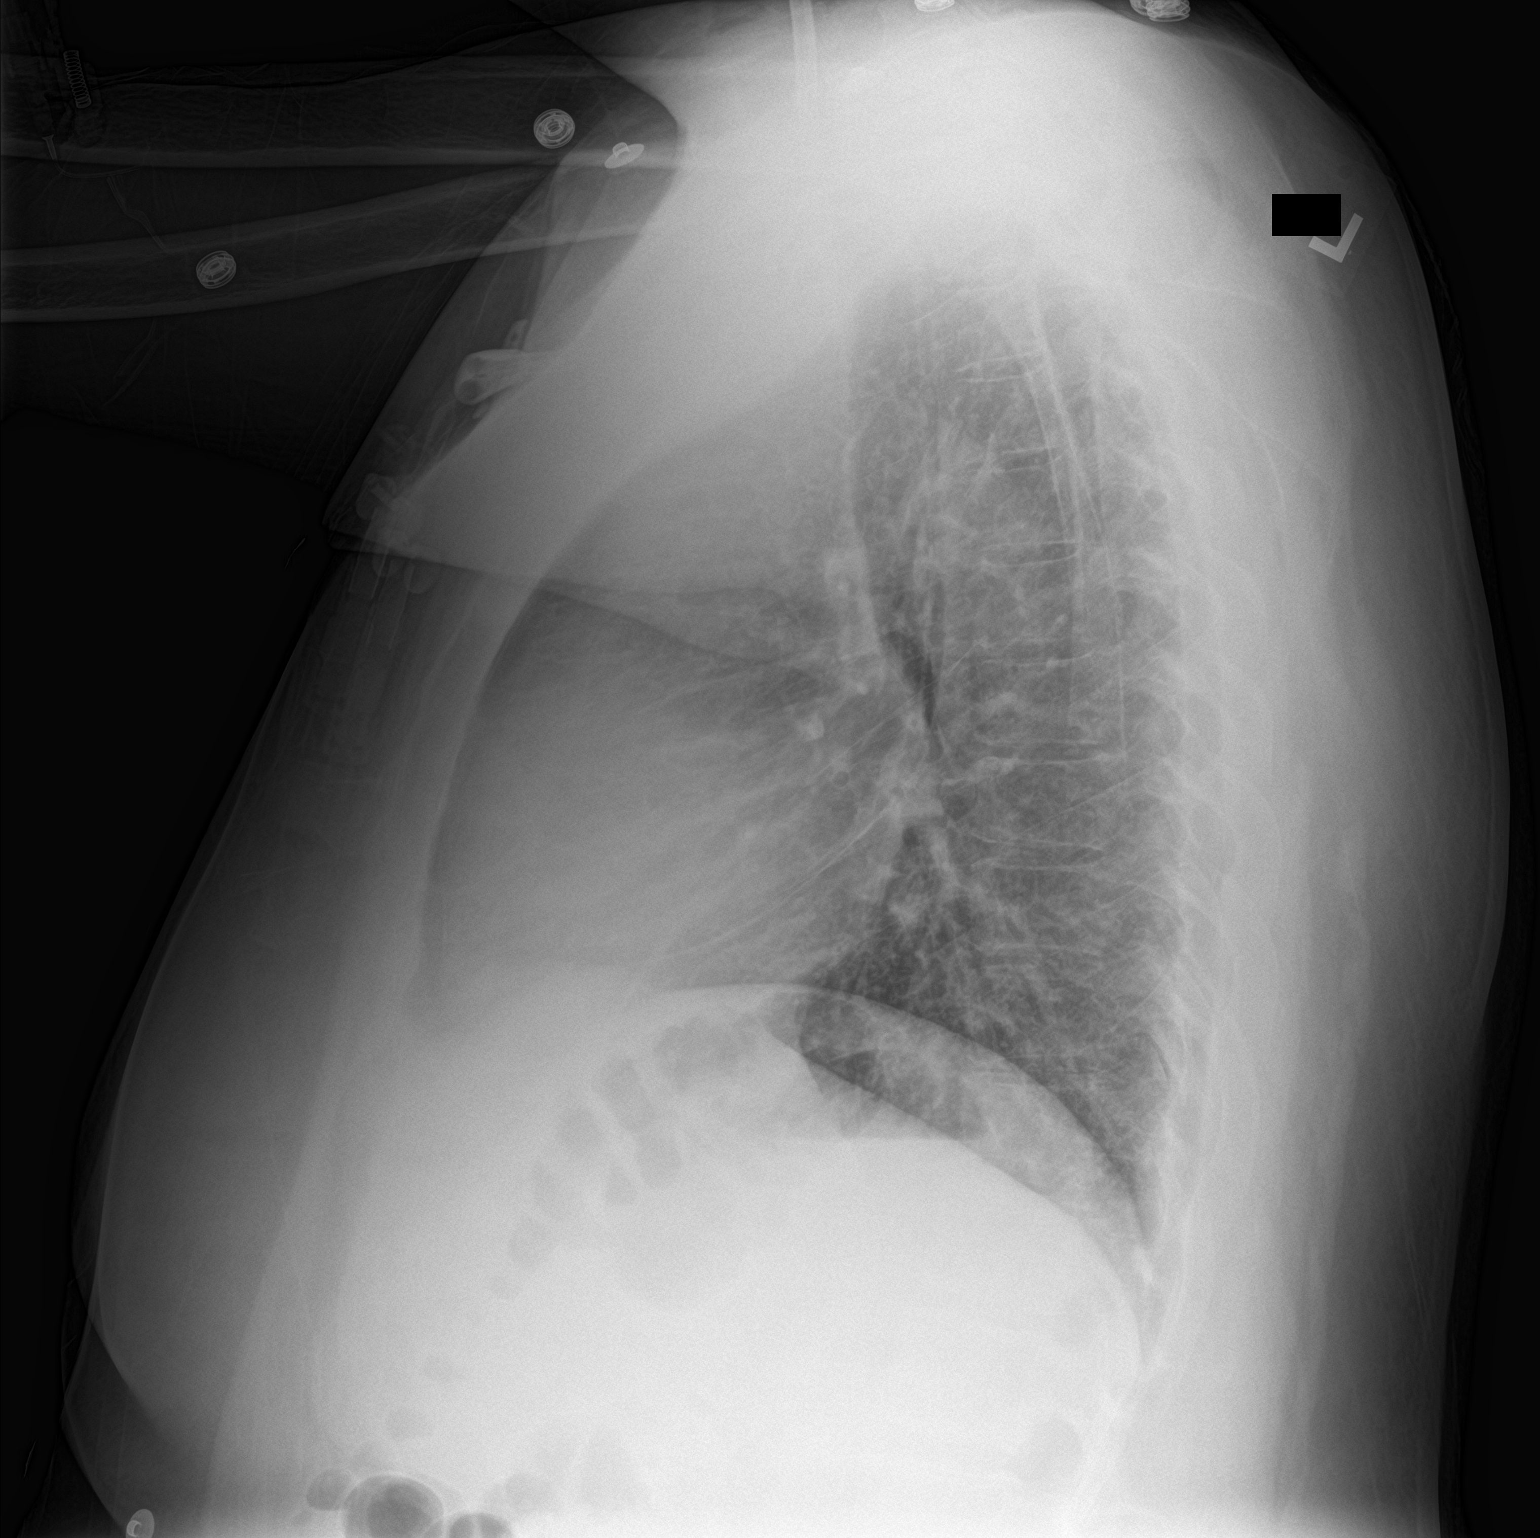

[2 of 2 positions shown; findings below may reference images not displayed]

FINDINGS: Stable mild cardiomegaly. Right central venous catheter tip is
stable in position, probably in the upper SVC or right
brachiocephalic vein. Mild interstitial pulmonary edema is stable.
No pleural effusion or consolidation. No pneumothorax. No acute
osseous abnormality is evident.
IMPRESSION: Stable mild cardiomegaly and interstitial edema. No focal
consolidation, effusion, or pneumothorax.

By: ROSELLON M.D.

## 2016-05-10 SURGERY — LEFT HEART CATH AND CORONARY ANGIOGRAPHY
Anesthesia: LOCAL

## 2016-05-10 MED ORDER — DARBEPOETIN ALFA 60 MCG/0.3ML IJ SOSY: 60.0000 ug | PREFILLED_SYRINGE | INTRAMUSCULAR | Status: DC

## 2016-05-10 MED ORDER — VITAMIN D 1000 UNITS PO TABS
1000.0000 [IU] | ORAL_TABLET | Freq: Every day | ORAL | Status: DC
  Administered 2016-05-10: 1000 [IU] via ORAL
  Filled 2016-05-10: qty 1

## 2016-05-10 MED ORDER — HYDRALAZINE HCL 20 MG/ML IJ SOLN
5.0000 mg | INTRAMUSCULAR | Status: DC | PRN
Start: 2016-05-10 — End: 2016-05-10

## 2016-05-10 MED ORDER — METOPROLOL TARTRATE 25 MG PO TABS
25.0000 mg | ORAL_TABLET | Freq: Two times a day (BID) | ORAL | Status: DC
  Administered 2016-05-10: 25 mg via ORAL
  Filled 2016-05-10: qty 1

## 2016-05-10 MED ORDER — ONDANSETRON HCL 4 MG/2ML IJ SOLN: 4.0000 mg | Freq: Four times a day (QID) | INTRAMUSCULAR | Status: DC | PRN

## 2016-05-10 MED ORDER — BACLOFEN 10 MG PO TABS
10.0000 mg | ORAL_TABLET | Freq: Three times a day (TID) | ORAL | Status: DC | PRN
Start: 2016-05-10 — End: 2016-05-10

## 2016-05-10 MED ORDER — ZOLPIDEM TARTRATE 5 MG PO TABS: 5.0000 mg | ORAL_TABLET | Freq: Every evening | ORAL | Status: DC | PRN

## 2016-05-10 MED ORDER — ATORVASTATIN CALCIUM 10 MG PO TABS
10.0000 mg | ORAL_TABLET | Freq: Every evening | ORAL | Status: DC
  Administered 2016-05-10: 10 mg via ORAL
  Filled 2016-05-10: qty 1

## 2016-05-10 MED ORDER — MORPHINE SULFATE (PF) 4 MG/ML IV SOLN: 2.0000 mg | INTRAVENOUS | Status: DC | PRN

## 2016-05-10 MED ORDER — ALBUTEROL SULFATE (2.5 MG/3ML) 0.083% IN NEBU
3.0000 mL | INHALATION_SOLUTION | Freq: Four times a day (QID) | RESPIRATORY_TRACT | Status: DC | PRN
Start: 2016-05-10 — End: 2016-05-10

## 2016-05-10 MED ORDER — ASPIRIN EC 81 MG PO TBEC
81.0000 mg | DELAYED_RELEASE_TABLET | Freq: Every day | ORAL | Status: DC
  Administered 2016-05-10: 81 mg via ORAL
  Filled 2016-05-10: qty 1

## 2016-05-10 MED ORDER — NITROGLYCERIN 0.4 MG SL SUBL: 0.4000 mg | SUBLINGUAL_TABLET | SUBLINGUAL | Status: DC | PRN

## 2016-05-10 MED ORDER — ACETAMINOPHEN 325 MG PO TABS: 650.0000 mg | ORAL_TABLET | ORAL | Status: DC | PRN

## 2016-05-10 MED ORDER — HEPARIN SODIUM (PORCINE) 5000 UNIT/ML IJ SOLN
5000.0000 [IU] | Freq: Three times a day (TID) | INTRAMUSCULAR | Status: DC
  Administered 2016-05-10: 5000 [IU] via SUBCUTANEOUS
  Filled 2016-05-10 (×2): qty 1

## 2016-05-10 MED ORDER — CALCIUM CARBONATE ANTACID 500 MG PO CHEW
1.0000 | CHEWABLE_TABLET | Freq: Three times a day (TID) | ORAL | Status: DC
  Administered 2016-05-10 (×3): 200 mg via ORAL
  Filled 2016-05-10 (×3): qty 1

## 2016-05-10 MED ORDER — RENA-VITE PO TABS: 1.0000 | ORAL_TABLET | Freq: Every day | ORAL | Status: DC

## 2016-05-10 MED ORDER — BACLOFEN 5 MG HALF TABLET
5.0000 mg | ORAL_TABLET | Freq: Two times a day (BID) | ORAL | Status: DC
Start: 2016-05-10 — End: 2016-05-10
  Administered 2016-05-10: 5 mg via ORAL
  Filled 2016-05-10 (×2): qty 1

## 2016-05-10 MED ORDER — HYDROCODONE-ACETAMINOPHEN 5-325 MG PO TABS
1.0000 | ORAL_TABLET | ORAL | Status: DC | PRN
  Administered 2016-05-10 (×2): 1 via ORAL
  Filled 2016-05-10 (×2): qty 1

## 2016-05-10 MED ORDER — INSULIN GLARGINE 100 UNIT/ML ~~LOC~~ SOLN
7.0000 [IU] | Freq: Every day | SUBCUTANEOUS | Status: DC
  Filled 2016-05-10: qty 0.07

## 2016-05-10 MED ORDER — INSULIN ASPART 100 UNIT/ML ~~LOC~~ SOLN: 0.0000 [IU] | Freq: Three times a day (TID) | SUBCUTANEOUS | Status: DC

## 2016-05-10 NOTE — Plan of Care (Signed)
Problem: Health Behavior/Discharge Planning: Goal: Ability to manage health-related needs will improve Outcome: Progressing Needs to be re-educated on importance of dialysis schedule and following it along with diet recommendations.

## 2016-05-10 NOTE — ED Notes (Addendum)
Pt asked to push call light when he can give [place] a urine sample. Pt expressed understanding

## 2016-05-10 NOTE — Consult Note (Signed)
[facility] Renal Consultation Note    Indication for Consultation:  Management of ESRD/hemodialysis; anemia, hypertension/volume and secondary hyperparathyroidism Referring MD - Ivor Costa, MD  PCP: Kenn File, MD  HPI: Brendan Campbell is a 46 y.o. male with ESRD secondary to DM who initiated dialysis last Monday at [facility] where he he states he was admitted for fluid overload. He had two hemodialysis treatments with a femoral catheter and then a right IJ cath was placed and he was dialyzed with that on Friday and discharged to start dialysis at the [facility] unit on Monday, but actually he was to sign papers and start today.He also had transfusion of 2 "bags of blood." during his hospitalization.  He is divorced and rents a "room or should I say couch" from a friend. He has no family members in the area. He grew up in [place].  His is on Medicaid and should start getting Medicare too in 08-22-22. His mother is deceased. He has a sister in [place].   Dr. Hinda Lenis is his nephrologist. He does not have a permanent access yet.  He thinks he came to [facility] instead of back to [facility] because what EMS saw on his cardiac strip.  He denies prior heart problems though he said his father died from an MI at age 20. His mother is deceased too. He has prior heavy etoh history and quit smoking 10 years ago. At present he has no SOB, CP.  He has some LE edema but it is better.  He had a prior BKA in 2014.  Yesterday he had a "disagreement- not really an argument" with the woman he stays with and started having a HA and some CP without SOB, diaphoresis. EMS was called. He was treatemd with one ASA and NTG enroute with resolution of symptoms. Today he feels fine without SOB, CP, N, V,D, fever or chills.  He makes urine without difficulty. He has chronic neuropathy and back pain.  Past Medical History:  Diagnosis Date  . Anemia   . Anxiety   . Blood transfusion without reported  diagnosis   . Depression   . Diabetes mellitus without complication ([place])   . Hyperlipidemia   . Neuropathy ([place])   . PTSD (post-traumatic stress disorder)    Past Surgical History:  Procedure Laterality Date  . BELOW KNEE LEG AMPUTATION Right   . CHOLECYSTECTOMY    . FOOT SURGERY     Family History  Problem Relation Age of Onset  . Cancer Mother     lung  . Diabetes Mother   . Heart attack Father   . Diabetes Father   . Diabetes Sister    Social History:  reports that he has never smoked. He has never used smokeless tobacco. He reports that he does not drink alcohol or use drugs. Allergies  Allergen Reactions  . Tape Other (See Comments)    Pulls skin off   Prior to Admission medications   Medication Sig Start Date End Date Taking? Authorizing Provider  albuterol (PROVENTIL HFA;VENTOLIN HFA) 108 (90 Base) MCG/ACT inhaler Inhale 1-2 puffs into the lungs every 6 (six) hours as needed for wheezing or shortness of breath.   Yes Historical Provider, MD  aspirin EC 81 MG tablet Take 81 mg by mouth daily.   Yes Historical Provider, MD  atorvastatin (LIPITOR) 10 MG tablet Take 1 tablet (10 mg total) by mouth every evening. 03/05/15  Yes Timmothy Euler, MD  baclofen (LIORESAL) 10 MG tablet  Take 1 tablet (10 mg total) by mouth every 8 (eight) hours as needed for muscle spasms. 03/08/16  Yes Meredith Staggers, MD  calcium carbonate (TUMS - DOSED IN MG ELEMENTAL CALCIUM) 500 MG chewable tablet Chew 3 tablets by mouth daily.   Yes Historical Provider, MD  cholecalciferol (VITAMIN D) 1000 units tablet Take 1,000 Units by mouth daily.   Yes Historical Provider, MD  HYDROcodone-acetaminophen (NORCO/VICODIN) 5-325 MG tablet Take 1 tablet by mouth every 4 (four) hours as needed for moderate pain.   Yes Historical Provider, MD  Insulin Glargine (LANTUS SOLOSTAR) 100 UNIT/ML Solostar Pen Inject 10-15 Units into the skin daily at 10 pm. Patient taking differently: Inject 10 Units into the skin  every evening.  12/16/15  Yes Timmothy Euler, MD  Insulin Pen Needle 31G X 5 MM MISC 1 Device by Does not apply route daily. 04/03/15  Yes Timmothy Euler, MD   Current Facility-Administered Medications  Medication Dose Route Frequency Provider Last Rate Last Dose  . acetaminophen (TYLENOL) tablet 650 mg  650 mg Oral Q4H PRN Ivor Costa, MD      . albuterol (PROVENTIL) (2.5 MG/3ML) 0.083% nebulizer solution 3 mL  3 mL Inhalation Q6H PRN Ivor Costa, MD      . aspirin EC tablet 81 mg  81 mg Oral Daily Ivor Costa, MD   81 mg at 05/10/16 0947  . atorvastatin (LIPITOR) tablet 10 mg  10 mg Oral QPM Ivor Costa, MD      . baclofen (LIORESAL) tablet 10 mg  10 mg Oral Q8H PRN Ivor Costa, MD      . calcium carbonate (TUMS - dosed in mg elemental calcium) chewable tablet 200 mg of elemental calcium  1 tablet Oral TID WC Ivor Costa, MD      . cholecalciferol (VITAMIN D) tablet 1,000 Units  1,000 Units Oral Daily Ivor Costa, MD   1,000 Units at 05/10/16 0947  . heparin injection 5,000 Units  5,000 Units Subcutaneous Q8H Ivor Costa, MD   5,000 Units at 05/10/16 [phone]  . hydrALAZINE (APRESOLINE) injection 5 mg  5 mg Intravenous Q2H PRN Ivor Costa, MD      . HYDROcodone-acetaminophen (NORCO/VICODIN) 5-325 MG per tablet 1 tablet  1 tablet Oral Q4H PRN Ivor Costa, MD   1 tablet at 05/10/16 0947  . insulin aspart (novoLOG) injection 0-9 Units  0-9 Units Subcutaneous TID WC Ivor Costa, MD      . insulin glargine (LANTUS) injection 7 Units  7 Units Subcutaneous Q2200 Ivor Costa, MD      . morphine 4 MG/ML injection 2 mg  2 mg Intravenous Q4H PRN Ivor Costa, MD      . nitroGLYCERIN (NITROSTAT) SL tablet 0.4 mg  0.4 mg Sublingual Q5 min PRN Ivor Costa, MD      . ondansetron [facility]) injection 4 mg  4 mg Intravenous Q6H PRN Ivor Costa, MD      . zolpidem (AMBIEN) tablet 5 mg  5 mg Oral QHS PRN Ivor Costa, MD       Labs: Basic Metabolic Panel:  Recent Labs Lab 05/10/16 0124  NA 137  K 4.1  CL 100*  CO2 22  GLUCOSE 92  BUN  46*  CREATININE 9.40*  CALCIUM 7.2*   Liver Function Tests: No results for input(s): AST, ALT, ALKPHOS, BILITOT, PROT, ALBUMIN in the last 168 hours. No results for input(s): LIPASE, AMYLASE in the last 168 hours. No results for input(s): AMMONIA in the last 168  hours. CBC:  Recent Labs Lab 05/10/16 0124  WBC 8.2  NEUTROABS 4.7  HGB 8.8*  HCT 27.4*  MCV 92.6  PLT 306   Cardiac Enzymes:  Recent Labs Lab 05/10/16 0124 05/10/16 0340 05/10/16 0718  TROPONINI 0.05* 0.06* 0.04*   CBG:  Recent Labs Lab 05/10/16 0643  GLUCAP 74   Iron Studies: No results for input(s): IRON, TIBC, TRANSFERRIN, FERRITIN in the last 72 hours. Studies/Results: Dg Chest 2 View  Result Date: 05/10/2016 CLINICAL DATA:  46 y/o  M; right-sided chest pain. EXAM: CHEST  2 VIEW COMPARISON:  05/06/2016 chest radiograph. FINDINGS: Stable mild cardiomegaly. Right central venous catheter tip is stable in position, probably in the upper SVC or right brachiocephalic vein. Mild interstitial pulmonary edema is stable. No pleural effusion or consolidation. No pneumothorax. No acute osseous abnormality is evident. IMPRESSION: Stable mild cardiomegaly and interstitial edema. No focal consolidation, effusion, or pneumothorax. Electronically Signed   By: Kristine Garbe M.D.   On: 05/10/2016 02:39    ROS: As per HPI otherwise negative.  Physical Exam: Vitals:   05/10/16 0119 05/10/16 0237 05/10/16 0300 05/10/16 0428  BP: (!) 173/94 (!) 159/80 (!) 169/80 (!) 166/87  Pulse: (!) 104 95 88 91  Resp: 13 20 (!) 25 18  Temp:    98.1 F (36.7 C)  TempSrc:    Oral  SpO2: 99% 98% 97% 98%  Weight:    103.7 kg (228 lb 9.6 oz)  Height:    6' (1.829 m)     General: WDWN NAD talkative Head: NCAT sclera not icteric MMM, very dark circles under eyes Neck: Supple.  Lungs: dim BS bases, few crackles Breathing is unlabored. Heart: RRR with S1 S2.  Abdomen: soft NT + BS Lower extremities: right BKA in prosthesis  left 1-2+ LE edema left toes s/p amp several toes - well healed no open wounds Neuro: A & O  X 3. Moves all extremities spontaneously. Walks with cane. Psych:  Responds to questions appropriately with a normal affect. Dialysis Access: right IJ - exit site ok neck insertion site - somewhat inflammed  Assessment/Plan: 1. Chest pain - high risk for caridac issues given DM and father died of MI at age 73 - 4 being cycled 0.5 0.6 0.4 lipids normal on statin 2. ESRD - new start last week to [facility] - started at [facility]; hasn't had outpt treatment yet- was to be on a TTS schedule- will need permanent access - he wants to stick with IJ for now defer to his outpt primary nephrolgist K 4.1 this am - use 4 K bath 3. Hypertension/volume  - should improve with lowering volume 4. Anemia  - hgb 8.8 - had transfusion last week - 2 units - start low dose ESA 5. Metabolic bone disease -  On tums - vit D 6. Nutrition - changed to renal carb mod 7. DM - 20 years - per primary 8. Chronic back pain/muscle spasms - followed by Dr. Steva Colder - on baclofan -have reduced dose to 5 mg BID - need to minimize use with ESRD.   Myriam Jacobson, PA-C [place] [phone] 05/10/2016, 10:43 AM   Pt seen, examined and agree w A/P as above. Pleasant young man, diabetic and just started on dialysis 1-2 weeks ago in [place], [place].  Leg and chest swelling improved a lot per the patient since starting on HD.  Here for CP episode.  Today is walking in the room in no distress. Plan  HD today, prob max UF as tolerated.  Kelly Splinter MD [employer] pager [phone]   05/10/2016, 1:08 PM

## 2016-05-10 NOTE — Progress Notes (Signed)
Patient was seen and examined at bedside. He was admitted early morning today. Please see H&P for detail.   Briefly, 46 year old male with history of diabetes with peripheral vascular disease status post right BKA, hyperlipidemia, PTSD, and anemia, ESRD on hemodialysis Monday Wednesday Friday presented with shortness of breath and chest pain. Patient reported that he missed his hemodialysis yesterday and then started feeling headache associated with the chest pain mostly in the right side. Patient was given aspirin and nitroglycerin with clinical improvement. Cardiology and nephrology was already consulted.  This morning recent reported feeling better. Denied chest pain or shortness of breath. Pending echocardiogram. Cardiology planning for cardiac stress test tomorrow morning. Nephrology is following for hemodialysis management. Likely hemodialysis treatment today. Patient has a right-sided tunneled catheter.  Labs consistent with normal potassium, elevated creatinine consistent with ESRD, anemia stable, mild elevation in troponin likely in the setting of ESRD.  Patient has elevated blood pressure. No hypoxia  Continue current medical and supportive care.  BP 178/89, HR 101 O2 sat 100 % in RA NAD Lungs: Clear bilateral, no wheezing or crackle Regular rate rhythm, S1-S2 normal   DVT prophylaxis with heparin subcutaneous.

## 2016-05-10 NOTE — H&P (Signed)
History and Physical    Brendan Campbell WVP:[id number] DOB: [birth year] DOA: 05/10/2016  Referring MD/NP/PA:   PCP: Kenn File, MD   Patient coming from:  The patient is coming from home.  At baseline, pt is independent for most of ADL.  Chief Complaint: Chest pain and HA  HPI: Brendan Campbell is a 46 y.o. male with medical history significant of diabetes mellitus, hyperlipidemia, depression, anxiety, PTSD, anemia, ESRD-HD (MWF), s/p of R BKA, who presents with chest pain.  Pt states that he had a "disagreement" with family member at home, and then started have headache, which traveled down to his front neck and front chest. The chest pain is constant, pressure-like, 8 out of 10 in severity, nonradiating. It is not associated with shortness of breath, diaphoresis. No fever, chills, cough. Pt received 324mg  ASA and 1 NTG en route with complete relief to his symptoms. Currently no chest pain or HA. Patient denies unilateral weakness, numbness or tingling sensations in extremities. No hearing loss or vision change.  Pt's last cardiac stress test was done 4 years ago in [employer]. No h/o cath. Pt recently started dialysis. Pt is on dialysis every MWF. He missed his appointment yesterday. Pt states that he had two loose stool bowel movement today, but no nausea, vomiting or abdominal pain.  ED Course: pt was found to have POC trop 0.05, and then trop 0.03, WBC 8.2, hemoglobin 8.8 which was 8.2 on 02/22/16, potassium 4.1, creatinine 9.40, BUN 46, tachycardia, oxygen saturation 98% on room air. Chest x-ray showed interstitial pulmonary edema. Patient is placed on telemetry bed for observation.  Review of Systems:   General: no fevers, chills, no changes in body weight HEENT: no blurry vision, hearing changes or sore throat. Has HA. Respiratory: no dyspnea, coughing, wheezing CV: has chest pain, no palpitations GI: no nausea, vomiting, abdominal pain, constipation GU: no dysuria, burning on  urination, increased urinary frequency, hematuria  Ext: no leg edema. s/p of R BKA Neuro: no unilateral weakness, numbness, or tingling, no vision change or hearing loss Skin: no rash, no skin tear. MSK: No muscle spasm, no deformity, no limitation of range of movement in spin Heme: No easy bruising.  Travel history: No recent long distant travel.  Allergy:  Allergies  Allergen Reactions  . Tape Other (See Comments)    Pulls skin off    Past Medical History:  Diagnosis Date  . Anemia   . Anxiety   . Blood transfusion without reported diagnosis   . Depression   . Diabetes mellitus without complication ([place])   . Hyperlipidemia   . Neuropathy ([place])   . PTSD (post-traumatic stress disorder)     Past Surgical History:  Procedure Laterality Date  . BELOW KNEE LEG AMPUTATION Right   . CHOLECYSTECTOMY    . FOOT SURGERY      Social History:  reports that he has never smoked. He has never used smokeless tobacco. He reports that he does not drink alcohol or use drugs.  Family History:  Family History  Problem Relation Age of Onset  . Cancer Mother     lung  . Diabetes Mother   . Heart attack Father   . Diabetes Father   . Diabetes Sister      Prior to Admission medications   Medication Sig Start Date End Date Taking? Authorizing Provider  albuterol (PROVENTIL HFA;VENTOLIN HFA) 108 (90 Base) MCG/ACT inhaler Inhale 1-2 puffs into the lungs every 6 (six) hours as needed for wheezing  or shortness of breath.   Yes Historical Provider, MD  aspirin EC 81 MG tablet Take 81 mg by mouth daily.   Yes Historical Provider, MD  atorvastatin (LIPITOR) 10 MG tablet Take 1 tablet (10 mg total) by mouth every evening. 03/05/15  Yes Timmothy Euler, MD  baclofen (LIORESAL) 10 MG tablet Take 1 tablet (10 mg total) by mouth every 8 (eight) hours as needed for muscle spasms. 03/08/16  Yes Meredith Staggers, MD  calcium carbonate (TUMS - DOSED IN MG ELEMENTAL CALCIUM) 500 MG chewable tablet Chew 3  tablets by mouth daily.   Yes Historical Provider, MD  cholecalciferol (VITAMIN D) 1000 units tablet Take 1,000 Units by mouth daily.   Yes Historical Provider, MD  HYDROcodone-acetaminophen (NORCO/VICODIN) 5-325 MG tablet Take 1 tablet by mouth every 4 (four) hours as needed for moderate pain.   Yes Historical Provider, MD  Insulin Glargine (LANTUS SOLOSTAR) 100 UNIT/ML Solostar Pen Inject 10-15 Units into the skin daily at 10 pm. Patient taking differently: Inject 10 Units into the skin every evening.  12/16/15  Yes Timmothy Euler, MD  Insulin Pen Needle 31G X 5 MM MISC 1 Device by Does not apply route daily. 04/03/15  Yes Timmothy Euler, MD    Physical Exam: Vitals:   05/10/16 0119 05/10/16 0237 05/10/16 0300  BP: (!) 173/94 (!) 159/80 (!) 169/80  Pulse: (!) 104 95 88  Resp: 13 20 (!) 25  SpO2: 99% 98% 97%   General: Not in acute distress HEENT:       Eyes: PERRL, EOMI, no scleral icterus.       ENT: No discharge from the ears and nose, no pharynx injection, no tonsillar enlargement.        Neck: No JVD, no bruit, no mass felt. Heme: No neck lymph node enlargement. Cardiac: S1/S2, RRR, No murmurs, No gallops or rubs. Respiratory: No rales, wheezing, rhonchi or rubs. Chest wall: has HD cath in the right upper chest with clear surroundings. GI: Soft, nondistended, nontender, no rebound pain, no organomegaly, BS present. GU: No hematuria Ext: No pitting leg edema bilaterally. 2+DP/PT pulse on the left leg. s/p of R BKA. Musculoskeletal: No joint deformities, No joint redness or warmth, no limitation of ROM in spin. Skin: No rashes.  Neuro: Alert, oriented X3, cranial nerves II-XII grossly intact, moves all extremities normally.  Psych: Patient is not psychotic, no suicidal or hemocidal ideation.  Labs on Admission: I have personally reviewed following labs and imaging studies  CBC:  Recent Labs Lab 05/10/16 0124  WBC 8.2  NEUTROABS 4.7  HGB 8.8*  HCT 27.4*  MCV 92.6    PLT 517   Basic Metabolic Panel:  Recent Labs Lab 05/10/16 0124  NA 137  K 4.1  CL 100*  CO2 22  GLUCOSE 92  BUN 46*  CREATININE 9.40*  CALCIUM 7.2*   GFR: CrCl cannot be calculated (Unknown ideal weight.). Liver Function Tests: No results for input(s): AST, ALT, ALKPHOS, BILITOT, PROT, ALBUMIN in the last 168 hours. No results for input(s): LIPASE, AMYLASE in the last 168 hours. No results for input(s): AMMONIA in the last 168 hours. Coagulation Profile: No results for input(s): INR, PROTIME in the last 168 hours. Cardiac Enzymes:  Recent Labs Lab 05/10/16 0124  TROPONINI 0.05*   BNP (last 3 results) No results for input(s): PROBNP in the last 8760 hours. HbA1C: No results for input(s): HGBA1C in the last 72 hours. CBG: No results for input(s): GLUCAP in  the last 168 hours. Lipid Profile: No results for input(s): CHOL, HDL, LDLCALC, TRIG, CHOLHDL, LDLDIRECT in the last 72 hours. Thyroid Function Tests: No results for input(s): TSH, T4TOTAL, FREET4, T3FREE, THYROIDAB in the last 72 hours. Anemia Panel: No results for input(s): VITAMINB12, FOLATE, FERRITIN, TIBC, IRON, RETICCTPCT in the last 72 hours. Urine analysis: No results found for: COLORURINE, APPEARANCEUR, LABSPEC, PHURINE, GLUCOSEU, HGBUR, BILIRUBINUR, KETONESUR, PROTEINUR, UROBILINOGEN, NITRITE, LEUKOCYTESUR Sepsis Labs: @LABRCNTIP (procalcitonin:4,lacticidven:4) )No results found for this or any previous visit (from the past 240 hour(s)).   Radiological Exams on Admission: Dg Chest 2 View  Result Date: 05/10/2016 CLINICAL DATA:  46 y/o  M; right-sided chest pain. EXAM: CHEST  2 VIEW COMPARISON:  05/06/2016 chest radiograph. FINDINGS: Stable mild cardiomegaly. Right central venous catheter tip is stable in position, probably in the upper SVC or right brachiocephalic vein. Mild interstitial pulmonary edema is stable. No pleural effusion or consolidation. No pneumothorax. No acute osseous abnormality is  evident. IMPRESSION: Stable mild cardiomegaly and interstitial edema. No focal consolidation, effusion, or pneumothorax. Electronically Signed   By: Kristine Garbe M.D.   On: 05/10/2016 02:39     EKG: Independently reviewed. Sinus rhythm, QTC 497, LAD, anteroseptal infarction pattern, ST elevation in V1-V2   Assessment/Plan Principal Problem:   Chest pain Active Problems:   DM2 (diabetes mellitus, type 2) (HCC)   HLD (hyperlipidemia)   ESRD on dialysis ([place])   Anemia due to end stage renal disease (HCC)   Chest pain: Etiology is not clear, since patient has significant risk factors including hyperlipidemia, diabetes mellitus, end-stage renal disease, will do chest pain rule out. No infiltration on chest x-ray. - will place on Tele bed for obs - cycle CE q6 x3 and repeat EKG in the am  - Nitroglycerin, Morphine, and aspirin, lipitor  - Risk factor stratification: will check FLP, UDS and A1C  - 2d echo  DM-II: Last A1c 8.2 on 09/15/15, poorly controled. Patient is taking lantus at home -will decrease Lantus dose from 10-15 to 7 units daily -SSI  HLD: Last LDL was 149 on 09/15/15, not at goal  -Continue home medications: lipitor -Check FLP  ESRD on dialysis (MWF): started HD this month. Missed HD on Monday. -will continue calcium carbonate and Vd -left message to real box for HD today  Anemia due to end stage renal disease: hgb stable -f/u by CBC   DVT ppx: SQ Heparin    Code Status: Full code Family Communication: None at bed side.     Disposition Plan:  Anticipate discharge back to previous home environment Consults called:  none Admission status: Obs / tele    Date of Service 05/10/2016    Ivor Costa [facility] Pager [phone]  If 7PM-7AM, please contact night-coverage [email or web address] Password [email or web address] 05/10/2016, 3:30 AM

## 2016-05-10 NOTE — ED Provider Notes (Signed)
By signing my name below, I, Georgette Shell, attest that this documentation has been prepared under the direction and in the presence of [place], DO. Electronically Signed: Georgette Shell, ED Scribe. 05/10/16. 1:28 AM.  TIME SEEN: 1:16 AM  CHIEF COMPLAINT:  Chief Complaint  Patient presents with  . Chest Pain   HPI:  HPI Comments: Brendan Campbell is a 46 y.o. male with h/o HTN, DM and HLD, who presents to the Emergency Department by EMS complaining of headache radiating to his neck and right side of his chest beginning just PTA. Pt reports his symptoms came on in the middle of an argument with a family member. He received 324mg  ASA and 1 NTG en route with complete relief to his symptoms. Pt's last cardiac stress test was done 4 years ago in [employer] [facility]. No h/o cath. Pt is not on blood thinners. Pt is on dialysis every MWF. He missed his appointment yesterday. Just started dialysis recently. Pt denies fever, chills, shortness of breath, N/V, diaphoresis, dizziness or any other associated symptoms. Pt states symptoms seem to be resolved at this time. Patient does not have a local cardiologist.  PCP: Kenn File, MD  ROS: See HPI Constitutional: no fever  Eyes: no drainage  ENT: no runny nose   Cardiovascular:  chest pain  Resp: no SOB  GI: no vomiting GU: no dysuria Integumentary: no rash  Allergy: no hives  Musculoskeletal: no leg swelling  Neurological: no slurred speech ROS otherwise negative  PAST MEDICAL HISTORY/PAST SURGICAL HISTORY:  Past Medical History:  Diagnosis Date  . Anemia   . Anxiety   . Blood transfusion without reported diagnosis   . Depression   . Diabetes mellitus without complication ([place])   . Hyperlipidemia   . Neuropathy ([place])   . PTSD (post-traumatic stress disorder)     MEDICATIONS:  Prior to Admission medications   Medication Sig Start Date End Date Taking? Authorizing Provider  atorvastatin (LIPITOR) 10 MG tablet Take 1 tablet (10 mg total) by  mouth every evening. 03/05/15   Timmothy Euler, MD  baclofen (LIORESAL) 10 MG tablet Take 1 tablet (10 mg total) by mouth every 8 (eight) hours as needed for muscle spasms. 03/08/16   Meredith Staggers, MD  Insulin Glargine (LANTUS SOLOSTAR) 100 UNIT/ML Solostar Pen Inject 10-15 Units into the skin daily at 10 pm. Patient taking differently: Inject 20 Units into the skin every evening.  12/16/15   Timmothy Euler, MD  Insulin Pen Needle 31G X 5 MM MISC 1 Device by Does not apply route daily. 04/03/15   Timmothy Euler, MD  sodium polystyrene (KAYEXALATE) 15 GM/60ML suspension Take 60 mLs (15 g total) by mouth 3 (three) times daily. 09/17/15   Timmothy Euler, MD    ALLERGIES:  No Known Allergies  SOCIAL HISTORY:  Social History  Substance Use Topics  . Smoking status: Never Smoker  . Smokeless tobacco: Never Used  . Alcohol use No    FAMILY HISTORY: Family History  Problem Relation Age of Onset  . Cancer Mother     lung  . Diabetes Mother   . Heart attack Father   . Diabetes Father   . Diabetes Sister     EXAM: BP (!) 173/94 (BP Location: Right Arm)   Pulse (!) 104   Resp 13   SpO2 99%  CONSTITUTIONAL: Alert and oriented and responds appropriately to questions. Obese. HEAD: Normocephalic EYES: Conjunctivae clear, pupils appear equal, EOMI ENT: normal nose;  moist mucous membranes NECK: Supple, no meningismus, no nuchal rigidity, no LAD  CARD: RRR; S1 and S2 appreciated; no murmurs, no clicks, no rubs, no gallops CHEST:  Chest wall is nontender to palpation.  No crepitus, ecchymosis, erythema, warmth, rash or other lesions present.   Patient has a dialysis catheter in the right chest wall with surrounding ecchymosis but no erythema, warmth or drainage. RESP: Normal chest excursion without splinting or tachypnea; breath sounds clear and equal bilaterally; no wheezes, no rhonchi, no rales, no hypoxia or respiratory distress, speaking full sentences ABD/GI: Normal bowel  sounds; non-distended; soft, non-tender, no rebound, no guarding, no peritoneal signs, no hepatosplenomegaly BACK:  The back appears normal and is non-tender to palpation, there is no CVA tenderness EXT: Normal ROM in all joints; non-tender to palpation; no edema; normal capillary refill; no cyanosis, no calf tenderness or swelling    SKIN: Normal color for age and race; warm; no rash NEURO: Moves all extremities equally PSYCH: The patient's mood and manner are appropriate. Grooming and personal hygiene are appropriate.   EKG Interpretation  Date/Time:  Tuesday May 10 2016 01:17:00 EDT Ventricular Rate:  101 PR Interval:    QRS Duration: 104 QT Interval:  383 QTC Calculation: 497 R Axis:   -42 Text Interpretation:  Sinus tachycardia Left axis deviation Probable anteroseptal infarct, recent No significant change since last tracing Confirmed by WARD,  DO, KRISTEN ([id number]) on 05/10/2016 1:45:45 AM      MEDICAL DECISION MAKING: Patient here with atypical chest pain. Called out as a code STEMI by EMS. Dr. Terrence Dupont is at bedside and has reviewed patient's EKG and has canceled a code STEMI. Patient completely pain-free after aspirin and nitroglycerin. Plan is to obtain cardiac workup, chest x-ray. Dr. Edd Arbour would like patient to be admitted to medicine for an ACS rule out. Patient comfortable with this plan. Doubt PE, doubt dissection.  ED PROGRESS: Patient's i-STAT troponin is negative. Hemoglobin is 8.8 which is improved compared to his recent. He has chronic kidney disease. Chest x-ray shows interstitial edema but he is not in any respiratory distress, has no increased work of breathing, denies feeling short of breath and is not hypoxic.  3:03 AM Discussed patient's case with hospitalist, Dr. Blaine Hamper.  I have recommended admission and patient (and family if present) agree with this plan. Admitting physician will place admission orders.   I reviewed all nursing notes, vitals, pertinent previous  records, EKGs, lab and urine results, imaging (as available).      I personally performed the services described in this documentation, which was scribed in my presence. The recorded information has been reviewed and is accurate.     [place], DO 05/10/16 [id number]

## 2016-05-10 NOTE — ED Notes (Signed)
Patient transported to X-ray 

## 2016-05-10 NOTE — ED Triage Notes (Signed)
Per EMS, pt from home with c/o chest pain, headache and right sided neck pain. Pt reports having a "disagreement" at home prior to the event. Pt received 324 mg aspirin and 1 NTG PTA. Pt reports no chest pain at this time. Dialysis Mon/Wed/Fri. Pt missed Monday's appointment. Bp-147/73, HR-103, RR-25, SpO2-97% room air.

## 2016-05-10 NOTE — Consult Note (Signed)
Reason for Consult: Chest pain Referring Physician: [facility] hospitalist  Jonus Coble is an 46 y.o. male.  HPI: Patient is 46 year old male with past medical history significant for multiple medical problems i.e. hypertension, diabetes mellitus hyperlipidemia depression and anxiety disorder PTSD, anemia, end-stage renal disease recently started on hemodialysis, anemia of chronic disease peripheral vascular disease status post right below-knee amputation, history of multiple nonhealing ulcers in the past came to hospital by EMS escorts time he was called which was subsequently canceled. Patient complained of right-sided neck pain and right-sided chest pain after having argument with the family member. Patient denies any nausea vomiting diaphoresis. Denies palpitation lightheadedness or syncope. EKG showed normal sinus rhythm with possible old anteroseptal wall MI and there were no acute ST elevation suggestive of STEMI. Patient does give history of exertional dyspnea. States had stress test approximately 45 years ago results of which are not available..  Past Medical History:  Diagnosis Date  . Anemia   . Anxiety   . Blood transfusion without reported diagnosis   . Depression   . Diabetes mellitus without complication ([place])   . Hyperlipidemia   . Neuropathy ([place])   . PTSD (post-traumatic stress disorder)     Past Surgical History:  Procedure Laterality Date  . BELOW KNEE LEG AMPUTATION Right   . CHOLECYSTECTOMY    . FOOT SURGERY      Family History  Problem Relation Age of Onset  . Cancer Mother     lung  . Diabetes Mother   . Heart attack Father   . Diabetes Father   . Diabetes Sister     Social History:  reports that he has never smoked. He has never used smokeless tobacco. He reports that he does not drink alcohol or use drugs.  Allergies:  Allergies  Allergen Reactions  . Tape Other (See Comments)    Pulls skin off    Medications: I have reviewed the patient's current  medications.  Results for orders placed or performed during the hospital encounter of 05/10/16 (from the past 48 hour(s))  CBC with Differential     Status: Abnormal   Collection Time: 05/10/16  1:24 AM  Result Value Ref Range   WBC 8.2 4.0 - 10.5 K/uL   RBC 2.96 (L) 4.22 - 5.81 MIL/uL   Hemoglobin 8.8 (L) 13.0 - 17.0 g/dL   HCT 27.4 (L) 39.0 - 52.0 %   MCV 92.6 78.0 - 100.0 fL   MCH 29.7 26.0 - 34.0 pg   MCHC 32.1 30.0 - 36.0 g/dL   RDW 14.1 11.5 - 15.5 %   Platelets 306 150 - 400 K/uL   Neutrophils Relative % 58 %   Neutro Abs 4.7 1.7 - 7.7 K/uL   Lymphocytes Relative 25 %   Lymphs Abs 2.0 0.7 - 4.0 K/uL   Monocytes Relative 10 %   Monocytes Absolute 0.9 0.1 - 1.0 K/uL   Eosinophils Relative 6 %   Eosinophils Absolute 0.5 0.0 - 0.7 K/uL   Basophils Relative 1 %   Basophils Absolute 0.1 0.0 - 0.1 K/uL  Basic metabolic panel     Status: Abnormal   Collection Time: 05/10/16  1:24 AM  Result Value Ref Range   Sodium 137 135 - 145 mmol/L   Potassium 4.1 3.5 - 5.1 mmol/L   Chloride 100 (L) 101 - 111 mmol/L   CO2 22 22 - 32 mmol/L   Glucose, Bld 92 65 - 99 mg/dL   BUN 46 (H) 6 - 20  mg/dL   Creatinine, Ser 9.40 (H) 0.61 - 1.24 mg/dL   Calcium 7.2 (L) 8.9 - 10.3 mg/dL   GFR calc non Af Amer 6 (L) >60 mL/min   GFR calc Af Amer 7 (L) >60 mL/min    Comment: (NOTE) The eGFR has been calculated using the CKD EPI equation. This calculation has not been validated in all clinical situations. eGFR's persistently <60 mL/min signify possible Chronic Kidney Disease.    Anion gap 15 5 - 15  Troponin I     Status: Abnormal   Collection Time: 05/10/16  1:24 AM  Result Value Ref Range   Troponin I 0.05 (HH) <0.03 ng/mL    Comment: CRITICAL RESULT CALLED TO, READ BACK BY AND VERIFIED WITH: K.STROHM,RN 0216 05/10/16 M.CAMPBELL   I-stat troponin, ED     Status: None   Collection Time: 05/10/16  1:30 AM  Result Value Ref Range   Troponin i, poc 0.03 0.00 - 0.08 ng/mL   Comment 3             Comment: Due to the release kinetics of cTnI, a negative result within the first hours of the onset of symptoms does not rule out myocardial infarction with certainty. If myocardial infarction is still suspected, repeat the test at appropriate intervals.   Lipid panel     Status: Abnormal   Collection Time: 05/10/16  3:40 AM  Result Value Ref Range   Cholesterol 139 0 - 200 mg/dL   Triglycerides 137 <150 mg/dL   HDL 40 (L) >40 mg/dL   Total CHOL/HDL Ratio 3.5 RATIO   VLDL 27 0 - 40 mg/dL   LDL Cholesterol 72 0 - 99 mg/dL    Comment:        Total Cholesterol/HDL:CHD Risk Coronary Heart Disease Risk Table                     Men   Women  1/2 Average Risk   3.4   3.3  Average Risk       5.0   4.4  2 X Average Risk   9.6   7.1  3 X Average Risk  23.4   11.0        Use the calculated Patient Ratio above and the CHD Risk Table to determine the patient's CHD Risk.        ATP III CLASSIFICATION (LDL):  <100     mg/dL   Optimal  100-129  mg/dL   Near or Above                    Optimal  130-159  mg/dL   Borderline  160-189  mg/dL   High  >190     mg/dL   Very High   Troponin I (q 6hr x 3)     Status: Abnormal   Collection Time: 05/10/16  3:40 AM  Result Value Ref Range   Troponin I 0.06 (HH) <0.03 ng/mL    Comment: CRITICAL VALUE NOTED.  VALUE IS CONSISTENT WITH PREVIOUSLY REPORTED AND CALLED VALUE.  MRSA PCR Screening     Status: None   Collection Time: 05/10/16  4:54 AM  Result Value Ref Range   MRSA by PCR NEGATIVE NEGATIVE    Comment:        The GeneXpert MRSA Assay (FDA approved for NASAL specimens only), is one component of a comprehensive MRSA colonization surveillance program. It is not intended to diagnose MRSA infection nor to guide  or monitor treatment for MRSA infections.   Rapid urine drug screen (hospital performed)     Status: Abnormal   Collection Time: 05/10/16  5:00 AM  Result Value Ref Range   Opiates POSITIVE (A) NONE DETECTED   Cocaine NONE  DETECTED NONE DETECTED   Benzodiazepines NONE DETECTED NONE DETECTED   Amphetamines NONE DETECTED NONE DETECTED   Tetrahydrocannabinol NONE DETECTED NONE DETECTED   Barbiturates NONE DETECTED NONE DETECTED    Comment:        DRUG SCREEN FOR MEDICAL PURPOSES ONLY.  IF CONFIRMATION IS NEEDED FOR ANY PURPOSE, NOTIFY LAB WITHIN 5 DAYS.        LOWEST DETECTABLE LIMITS FOR URINE DRUG SCREEN Drug Class       Cutoff (ng/mL) Amphetamine      1000 Barbiturate      200 Benzodiazepine   924 Tricyclics       268 Opiates          300 Cocaine          300 THC              50   Glucose, capillary     Status: None   Collection Time: 05/10/16  6:43 AM  Result Value Ref Range   Glucose-Capillary 74 65 - 99 mg/dL   Comment 1 Notify RN    Comment 2 Document in Chart   Troponin I (q 6hr x 3)     Status: Abnormal   Collection Time: 05/10/16  7:18 AM  Result Value Ref Range   Troponin I 0.04 (HH) <0.03 ng/mL    Comment: CRITICAL VALUE NOTED.  VALUE IS CONSISTENT WITH PREVIOUSLY REPORTED AND CALLED VALUE.    Dg Chest 2 View  Result Date: 05/10/2016 CLINICAL DATA:  46 y/o  M; right-sided chest pain. EXAM: CHEST  2 VIEW COMPARISON:  05/06/2016 chest radiograph. FINDINGS: Stable mild cardiomegaly. Right central venous catheter tip is stable in position, probably in the upper SVC or right brachiocephalic vein. Mild interstitial pulmonary edema is stable. No pleural effusion or consolidation. No pneumothorax. No acute osseous abnormality is evident. IMPRESSION: Stable mild cardiomegaly and interstitial edema. No focal consolidation, effusion, or pneumothorax. Electronically Signed   By: Kristine Garbe M.D.   On: 05/10/2016 02:39    Review of Systems  Constitutional: Negative for chills and fever.  Eyes: Negative for double vision.  Respiratory: Positive for shortness of breath.   Cardiovascular: Positive for chest pain. Negative for palpitations, orthopnea and leg swelling.  Gastrointestinal:  Negative for nausea and vomiting.  Genitourinary: Negative for dysuria.  Neurological: Negative for dizziness.   Blood pressure (!) 166/87, pulse 91, temperature 98.1 F (36.7 C), temperature source Oral, resp. rate 18, height 6' (1.829 m), weight 228 lb 9.6 oz (103.7 kg), SpO2 98 %. Physical Exam  Constitutional: He is oriented to person, place, and time.  Eyes: Conjunctivae are normal. Pupils are equal, round, and reactive to light. Left eye exhibits no discharge. No scleral icterus.  Neck: Normal range of motion. Neck supple. No JVD present. No tracheal deviation present. No thyromegaly present.  Cardiovascular: Normal rate and regular rhythm.   Murmur (Soft systolic murmur and S3 gallop noted no S4 gallop or rub) heard. Respiratory:  Decreased breath sound at bases  GI: Soft. Bowel sounds are normal. He exhibits no distension. There is no tenderness. There is no rebound.  Musculoskeletal:  Right below-knee amputation noted  Neurological: He is alert and oriented to person, place, and time.  Assessment/Plan: Atypical chest pain MI ruled out Minimally elevated troponin I secondary to renal failure mild volume overload doubt significant MI Hypertension Diabetes mellitus Hyperlipidemia End-stage renal disease on hemodialysis Anemia of chronic disease Peripheral vascular disease status post right BKA PTSD Anxiety disorder Plan Add low-dose beta blocker Check 2-D echo check LV systolic function/wall motion abnormalities Schedule for Lexiscan Myoview in a.m. Charolette Forward 05/10/2016, 9:39 AM

## 2016-05-10 NOTE — ED Notes (Signed)
Called [facility] to cancel stemi per harwani- ty

## 2016-05-11 LAB — HEMOGLOBIN A1C
HEMOGLOBIN A1C: 5.1 % (ref 4.8–5.6)
MEAN PLASMA GLUCOSE: 100 mg/dL

## 2016-05-11 NOTE — Discharge Summary (Signed)
Patient left AMA on 05/10/2016. Please see note from 05/10/2016 for detail.   He reported that he feels fine and doesn't want to wait for any test. He said he understands the risk of leaving early including MI, heart failure and even death.   I advised him to follow up with his PCP, nephrologist and cardiologist outpatient.

## 2016-05-13 ENCOUNTER — Other Ambulatory Visit (HOSPITAL_COMMUNITY): Payer: Self-pay | Admitting: Nephrology

## 2016-05-13 DIAGNOSIS — N186 End stage renal disease: Secondary | ICD-10-CM

## 2016-05-16 ENCOUNTER — Other Ambulatory Visit (HOSPITAL_COMMUNITY): Payer: Self-pay | Admitting: Nephrology

## 2016-05-16 ENCOUNTER — Ambulatory Visit (HOSPITAL_COMMUNITY)
Admission: RE | Admit: 2016-05-16 | Discharge: 2016-05-16 | Disposition: A | Discharge status: Home / Self Care | Payer: Medicaid Other | Source: Ambulatory Visit | Attending: Nephrology | Admitting: Nephrology

## 2016-05-16 ENCOUNTER — Telehealth (HOSPITAL_COMMUNITY): Payer: Self-pay

## 2016-05-16 ENCOUNTER — Encounter (HOSPITAL_COMMUNITY): Payer: Self-pay | Admitting: Interventional Radiology

## 2016-05-16 DIAGNOSIS — T8241XA Breakdown (mechanical) of vascular dialysis catheter, initial encounter: Secondary | ICD-10-CM | POA: Insufficient documentation

## 2016-05-16 DIAGNOSIS — Y712 Prosthetic and other implants, materials and accessory cardiovascular devices associated with adverse incidents: Secondary | ICD-10-CM | POA: Insufficient documentation

## 2016-05-16 DIAGNOSIS — N186 End stage renal disease: Secondary | ICD-10-CM

## 2016-05-16 DIAGNOSIS — Z992 Dependence on renal dialysis: Secondary | ICD-10-CM | POA: Insufficient documentation

## 2016-05-16 HISTORY — PX: IR FLUORO GUIDE CV LINE RIGHT: IMG2283

## 2016-05-16 HISTORY — PX: IR US GUIDE VASC ACCESS RIGHT: IMG2390

## 2016-05-16 HISTORY — PX: IR REMOVAL TUN CV CATH W/O FL: IMG2289

## 2016-05-16 LAB — GLUCOSE, CAPILLARY: Glucose-Capillary: 94 mg/dL (ref 65–99)

## 2016-05-16 IMAGING — XA IR US GUIDE VASC ACCESS RIGHT
3 series · 5 of 5 positions shown · non-contrast
Comparison: none

INDICATION: 36-year-old male with end-stage renal disease on hemodialysis. He is
currently attempting dialysis through a or right IJ approach
tunneled hemodialysis catheter which was placed at an outside
institution approximately 1 week ago. The patient has not been able
to undergo a successful dialysis sessions secondary to poor flow
rates. He presents for catheter evaluation and possible repair
versus exchange versus replacement.

[Series 1: ir fluoro/shunt/fist · 1 of 1 slices shown]
[im 1/1]
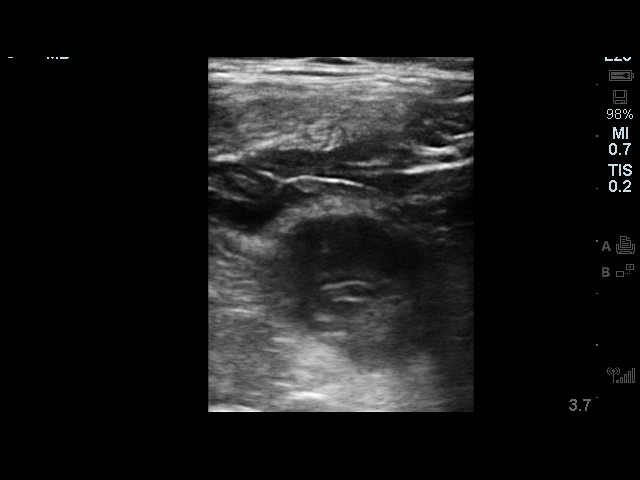

[Series 1: fl (-) angio · 2 of 2 slices shown (1 of 2)]
[im 1/2]
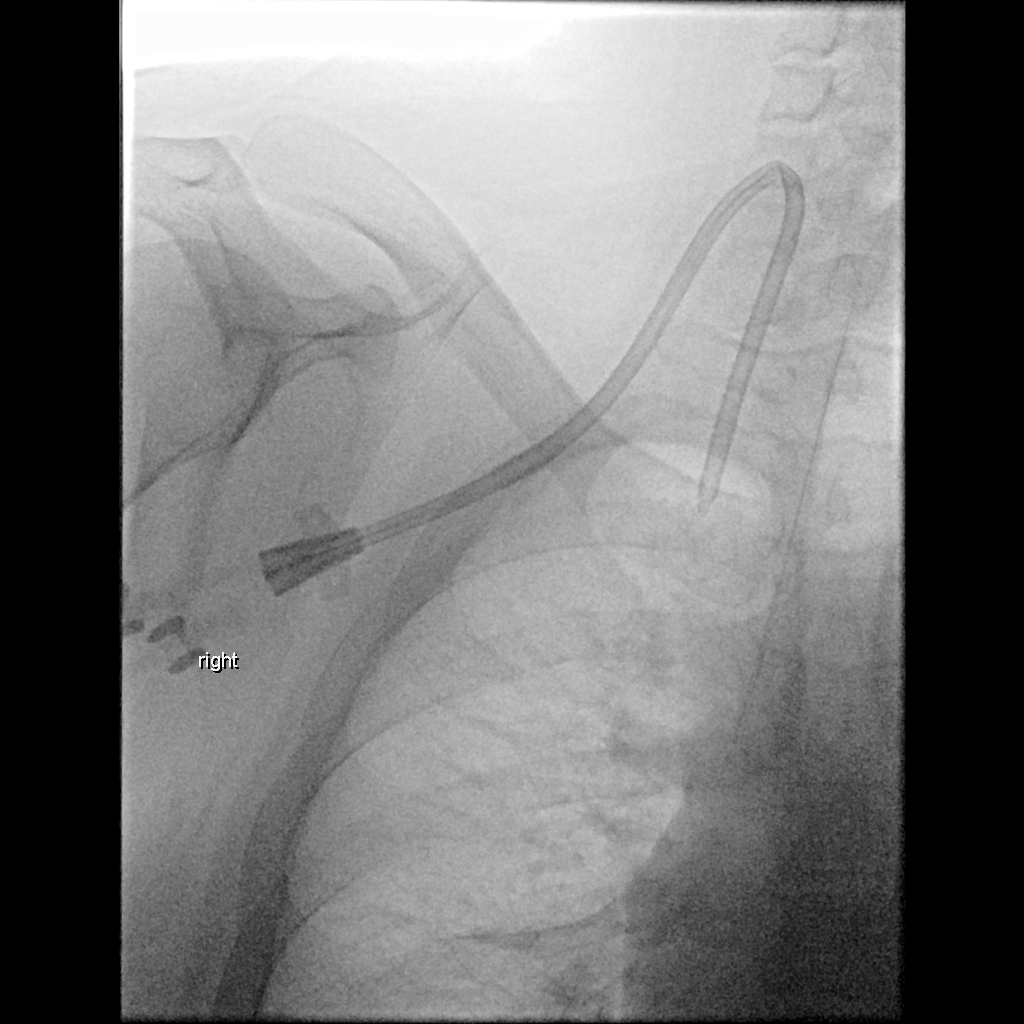
[im 2/2]
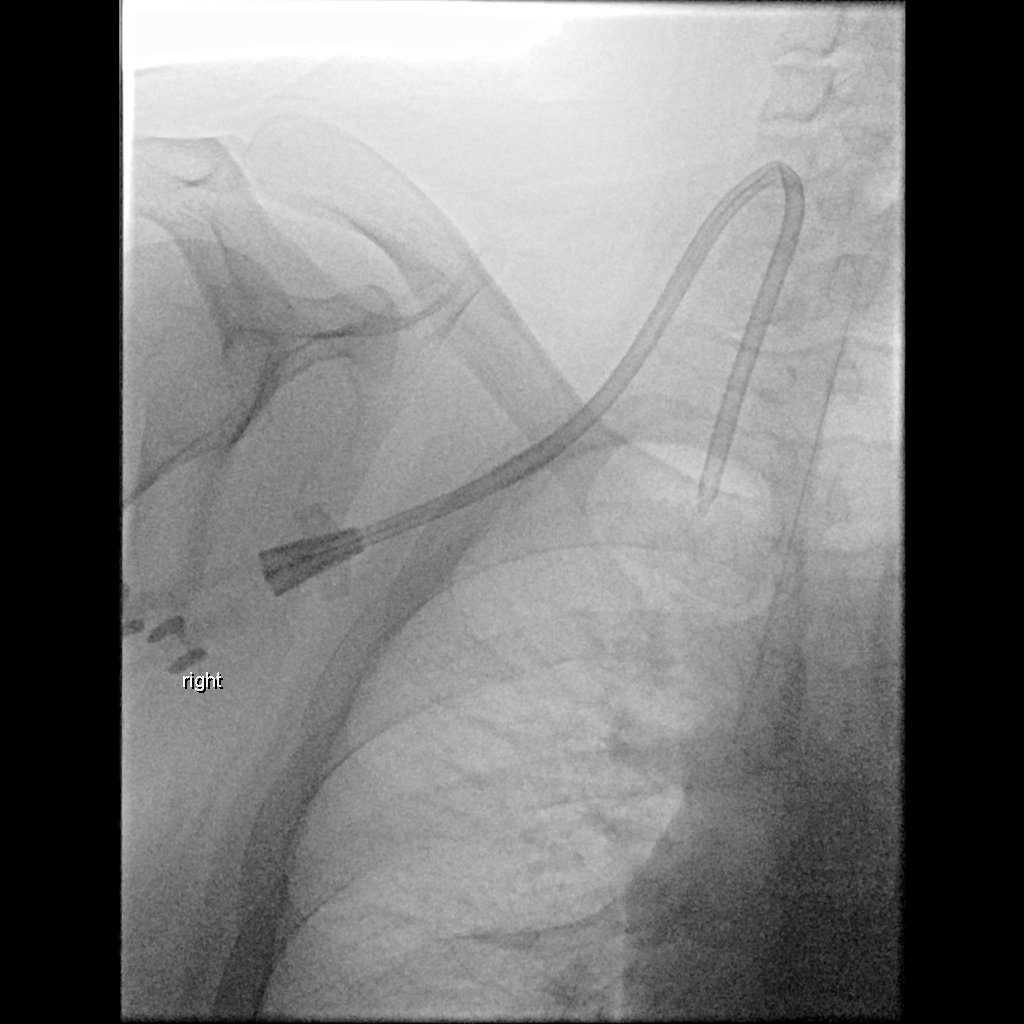

[Series 2: fl (-) angio · 2 of 2 slices shown (2 of 2)]
[im 1/2]
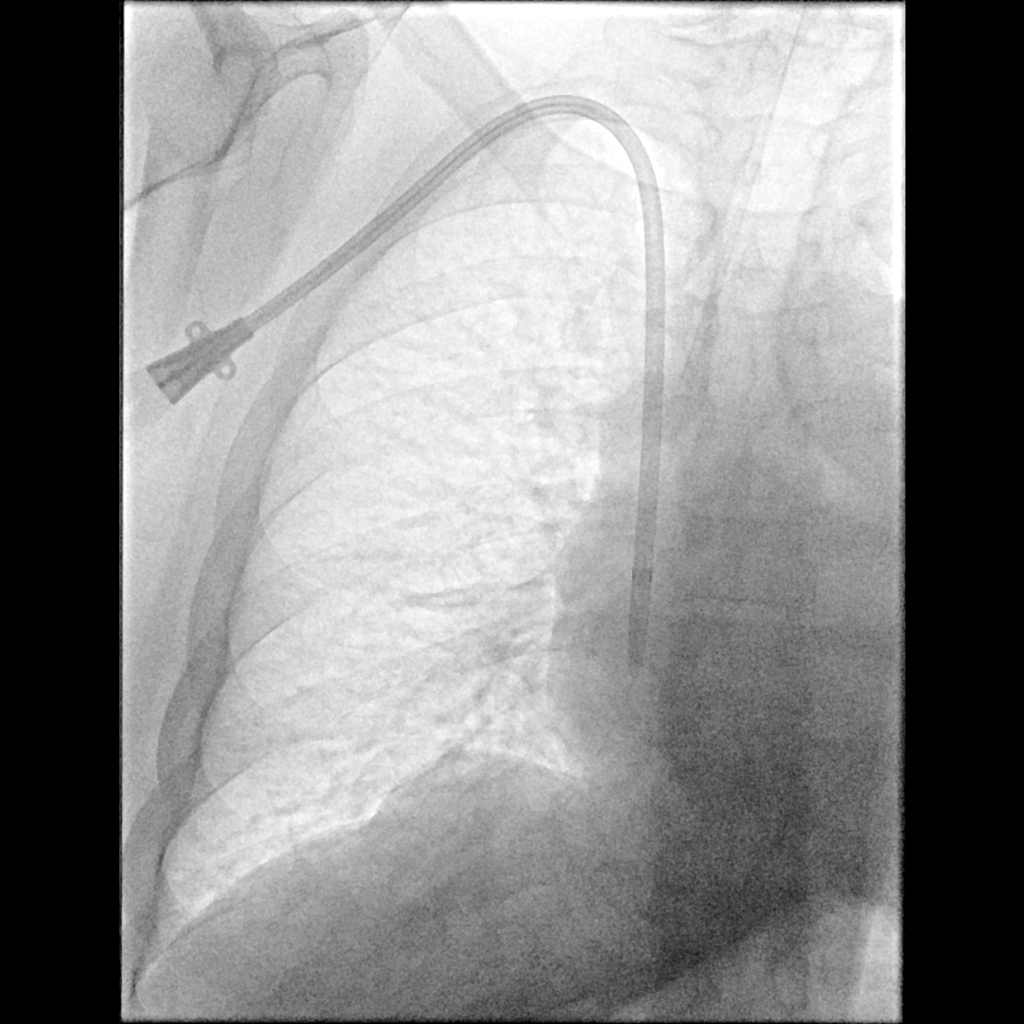
[im 2/2]
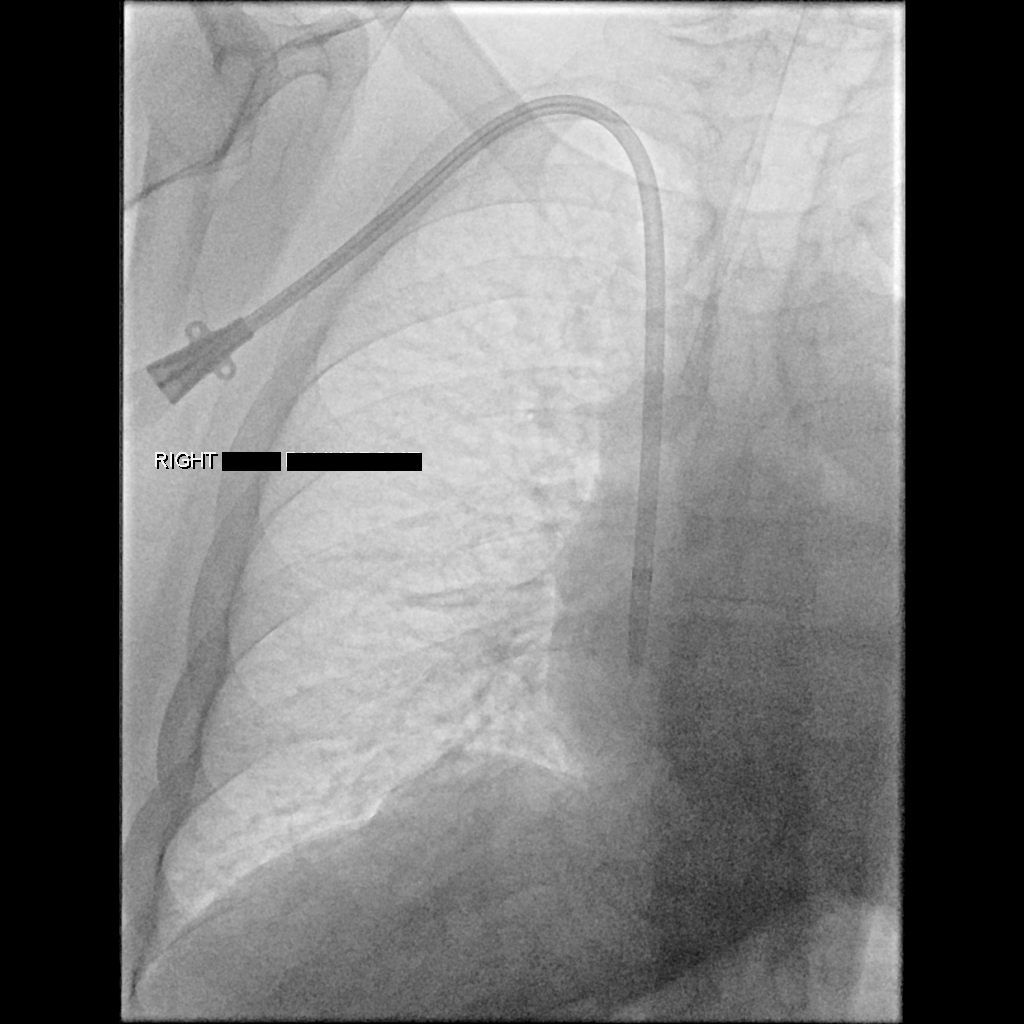

[5 of 5 positions shown; findings below may reference images not displayed]

EXAM:
TUNNELED CENTRAL VENOUS HEMODIALYSIS CATHETER PLACEMENT WITH
ULTRASOUND AND FLUOROSCOPIC GUIDANCE

MEDICATIONS:
2g Ancef. The antibiotic was given in an appropriate time interval
prior to skin puncture.

ANESTHESIA/SEDATION:
10 mg Valium given p.o. 30 minutes prior to the procedure for
anxiolysis

FLUOROSCOPY TIME:  Fluoroscopy Time: 1 minutes 18 seconds (10.7
MGy).

COMPLICATIONS:
None immediate.



An initial spot image of the existing catheter was obtained. The IJ
access site is fairly high on the neck at the level of approximately
C4. There is a focal significant kink were the catheter enters the
vein. The catheter tip is positioned in the distal internal jugular
vein at the superior margin of the clavicle. Due to the combination
of the high IJ access, and the kink this catheter is not
salvageable. Therefore, the decision was made to remove this
catheter and place a new device. Local anesthesia was attained by
infiltration with 1% lidocaine around the catheter insertion site.
The retention sutures were cut. Using a combination of blunt and
sharp surgical dissection the fabric cuff was freed from the
surrounding tissues in the catheter was removed. Hemostasis was
attained with gentle manual pressure.

After creating a small venotomy incision, a micropuncture kit was
utilized to access the right internal jugular vein under direct,
real-time ultrasound guidance after the overlying soft tissues were
anesthetized with 1% lidocaine with epinephrine. Ultrasound image
documentation was performed. The microwire was kinked to measure
appropriate catheter length. A stiff Glidewire was advanced to the
level of the IVC and the micropuncture sheath was exchanged for a
peel-away sheath. A Palindrome tunneled hemodialysis catheter
measuring 23 cm from tip to cuff was tunneled in a retrograde
fashion from the anterior chest wall to the venotomy incision.

The catheter was then placed through the peel-away sheath with tips
ultimately positioned within the mid aspect of the right atrium.
Final catheter positioning was confirmed and documented with a spot
radiographic image. The catheter aspirates and flushes normally. The
catheter was flushed with appropriate volume heparin dwells.

The catheter exit site was secured with a 0-Prolene retention
suture. The venotomy incision was closed with an interrupted 4-0
Vicryl, Dermabond and SARWAT. Dressings were applied. The
patient tolerated the procedure well without immediate post
procedural complication.
IMPRESSION: 1. Removal of a nonfunctioning and non salvageable right IJ
hemodialysis catheter.
2. Successful placement of a new 23 cm tip to cuff tunneled
hemodialysis catheter via the right internal jugular vein with tips
terminating within the mid right atrium. The catheter is ready for
immediate use.

## 2016-05-16 MED ORDER — LIDOCAINE HCL 1 % IJ SOLN
INTRAMUSCULAR | Status: AC
  Administered 2016-05-16: 19 mL
  Filled 2016-05-16: qty 20

## 2016-05-16 MED ORDER — HEPARIN SODIUM (PORCINE) 1000 UNIT/ML IJ SOLN
INTRAMUSCULAR | Status: AC
  Administered 2016-05-16: 3.8 mL
  Filled 2016-05-16: qty 1

## 2016-05-16 MED ORDER — CEFAZOLIN SODIUM-DEXTROSE 2-4 GM/100ML-% IV SOLN
INTRAVENOUS | Status: AC
  Filled 2016-05-16: qty 100

## 2016-05-16 MED ORDER — CEFAZOLIN SODIUM-DEXTROSE 2-4 GM/100ML-% IV SOLN: 2.0000 g | INTRAVENOUS | Status: DC

## 2016-05-16 MED ORDER — CEFAZOLIN SODIUM-DEXTROSE 2-4 GM/100ML-% IV SOLN
2.0000 g | Freq: Once | INTRAVENOUS | Status: AC
  Administered 2016-05-16: 2 g via INTRAVENOUS

## 2016-05-16 MED ORDER — DIAZEPAM 5 MG PO TABS
ORAL_TABLET | ORAL | Status: AC
  Filled 2016-05-16: qty 2

## 2016-05-16 MED ORDER — DIAZEPAM 5 MG PO TABS
10.0000 mg | ORAL_TABLET | Freq: Once | ORAL | Status: AC
Start: 2016-05-16 — End: 2016-05-16
  Administered 2016-05-16: 10 mg via ORAL

## 2016-05-16 NOTE — Telephone Encounter (Signed)
Called to have pt come in early, left message for pt to return call. AW

## 2016-05-16 NOTE — Procedures (Signed)
Interventional Radiology Procedure Note  Procedure:  1.) Removal non-functioning HD catheter. 2.) Placement of a new HD catheter.  Catheter ti pin the mid RA and working well.   Complications: None  Estimated Blood Loss: None  Recommendations: - May begin dialysis - Routine line care  Signed,  Criselda Peaches, MD

## 2016-05-18 ENCOUNTER — Telehealth: Payer: Self-pay | Admitting: Family Medicine

## 2016-05-18 NOTE — Telephone Encounter (Signed)
Faxed flu shot record to [phone]

## 2016-06-01 ENCOUNTER — Other Ambulatory Visit: Payer: Self-pay | Admitting: *Deleted

## 2016-06-01 DIAGNOSIS — N186 End stage renal disease: Secondary | ICD-10-CM

## 2016-06-01 DIAGNOSIS — Z01810 Encounter for preprocedural cardiovascular examination: Secondary | ICD-10-CM

## 2016-06-02 ENCOUNTER — Ambulatory Visit (INDEPENDENT_AMBULATORY_CARE_PROVIDER_SITE_OTHER): Payer: Medicaid Other | Admitting: Family Medicine

## 2016-06-02 ENCOUNTER — Encounter: Payer: Self-pay | Admitting: Family Medicine

## 2016-06-02 VITALS — BP 163/99 | HR 92 | Temp 96.8°F | Ht 73.0 in | Wt 224.6 lb

## 2016-06-02 DIAGNOSIS — Z89511 Acquired absence of right leg below knee: Secondary | ICD-10-CM

## 2016-06-02 DIAGNOSIS — R079 Chest pain, unspecified: Secondary | ICD-10-CM

## 2016-06-02 DIAGNOSIS — K5900 Constipation, unspecified: Secondary | ICD-10-CM | POA: Diagnosis not present

## 2016-06-02 DIAGNOSIS — E11621 Type 2 diabetes mellitus with foot ulcer: Secondary | ICD-10-CM | POA: Diagnosis not present

## 2016-06-02 DIAGNOSIS — L97509 Non-pressure chronic ulcer of other part of unspecified foot with unspecified severity: Secondary | ICD-10-CM | POA: Diagnosis not present

## 2016-06-02 DIAGNOSIS — Z794 Long term (current) use of insulin: Secondary | ICD-10-CM

## 2016-06-02 MED ORDER — ATORVASTATIN CALCIUM 10 MG PO TABS: 10.0000 mg | ORAL_TABLET | Freq: Every evening | ORAL | 3 refills | Status: DC

## 2016-06-02 MED ORDER — POLYETHYLENE GLYCOL 3350 17 GM/SCOOP PO POWD: ORAL | 1 refills | Status: DC

## 2016-06-02 MED ORDER — VITAMIN D 1000 UNITS PO TABS: 1000.0000 [IU] | ORAL_TABLET | Freq: Every day | ORAL | 1 refills | Status: DC

## 2016-06-02 MED ORDER — CARVEDILOL 3.125 MG PO TABS
3.1250 mg | ORAL_TABLET | Freq: Two times a day (BID) | ORAL | 3 refills | Status: DC
Start: 2016-06-02 — End: 2016-09-22

## 2016-06-02 MED ORDER — BLOOD GLUCOSE MONITOR KIT: PACK | 0 refills | Status: DC

## 2016-06-02 NOTE — Progress Notes (Signed)
HPI  Patient presents today here for hospital follow-up.  Patient was seen in the hospital for chest pain. He left before he could have enbtire work up [employer].   He describes exertional chest pain and dyspnea which improved with rest. He states that after dialysis his pain is improved. He is radiation to the neck at times. He denies any chest pain today. He has not started any new medications recently.  He also complains of mild generalized abdominal pain which he associates with constipation.  Patient states he has a difficult time tolerating entire session of dialysis.  PMH: Smoking status noted ROS: Per HPI  Objective: BP (!) 163/99   Pulse 92   Temp (!) 96.8 F (36 C) (Oral)   Ht 6' 1"  (1.854 m)   Wt 224 lb 9.6 oz (101.9 kg)   BMI 29.63 kg/m  Gen: NAD, alert, cooperative with exam HEENT: NCAT CV: RRR Resp: CTABL, no wheezes, non-labored Ext: No edema, warm Neuro: Alert and oriented, No gross deficits  Diabetic Foot Exam - Simple   Simple Foot Form Diabetic Foot exam was performed with the following findings:  Yes 06/02/2016 11:11 AM  Visual Inspection See comments:  Yes Sensation Testing See comments:  Yes Pulse Check See comments:  Yes Comments Normal dorsalis pedis pulse in the left, left foot with first and second ray amputation Right lower extremity status post BKA      Assessment and plan:  # Type 2 diabetes Previous A1c well controlled, checked in the emergency room on 4/3. Continue 10 units of Lantus daily. 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  # Chest pain stable chest pain, none today I am very concerned about cardiac etiology with DM2 and ESRD Continue statin ( hepatic function panel today)  Start BB Cardiology follow up, likely needs work up.   # Constipation.  Mild, mild generalized abdominal pain Start miralax Consider linzess if needed  # S/p BKA Doing well with orthotic,  needs [facility] for adjustment but overall doing well.  Encouraged to continue walking as much as tolerated.     Orders Placed This Encounter  Procedures  . Hepatic function panel  . CBC with Differential/Platelet  . Ambulatory referral to Cardiology    Referral Priority:   Routine    Referral Type:   Consultation    Referral Reason:   Specialty Services Required    Requested Specialty:   Cardiology    Number of Visits Requested:   1    Meds ordered this encounter  Medications  . atorvastatin (LIPITOR) 10 MG tablet    Sig: Take 1 tablet (10 mg total) by mouth every evening.    Dispense:  90 tablet    Refill:  3  . cholecalciferol (VITAMIN D) 1000 units tablet    Sig: Take 1 tablet (1,000 Units total) by mouth daily.    Dispense:  90 tablet    Refill:  1  . carvedilol (COREG) 3.125 MG tablet    Sig: Take 1 tablet (3.125 mg total) by mouth 2 (two) times daily with a meal.    Dispense:  60 tablet    Refill:  3  . polyethylene glycol powder (GLYCOLAX/MIRALAX) powder    Sig: 1/2 to 2 capfuls daily for constipation.    Dispense:  3350 g    Refill:  1  . blood glucose meter kit and supplies KIT    Sig: Dispense based  on patient and insurance preference. Use up to four times daily as directed. (FOR ICD-9 250.00, 250.01).    Dispense:  1 each    Refill:  0    Order Specific Question:   Number of strips    Answer:   100    Order Specific Question:   Number of lancets    Answer:   [place], MD [facility] 06/02/2016, 11:17 AM

## 2016-06-02 NOTE — Patient Instructions (Addendum)
Great to see you!  Try miralax 1 capful daily ( increase up to 2 caps daily or down to 1/2 capful daily) to help your bowels move.   Be sure to see the cardiologist  Start coreg 1 pill twice daily

## 2016-06-03 LAB — CBC WITH DIFFERENTIAL/PLATELET
Basophils Absolute: 0.1 10*3/uL (ref 0.0–0.2)
Basos: 1 %
EOS (ABSOLUTE): 0.5 10*3/uL — ABNORMAL HIGH (ref 0.0–0.4)
EOS: 7 %
HEMATOCRIT: 32.9 % — AB (ref 37.5–51.0)
HEMOGLOBIN: 10.4 g/dL — AB (ref 13.0–17.7)
IMMATURE GRANULOCYTES: 0 %
Immature Grans (Abs): 0 10*3/uL (ref 0.0–0.1)
Lymphocytes Absolute: 2.9 10*3/uL (ref 0.7–3.1)
Lymphs: 37 %
MCH: 29.7 pg (ref 26.6–33.0)
MCHC: 31.6 g/dL (ref 31.5–35.7)
MCV: 94 fL (ref 79–97)
Monocytes Absolute: 0.4 10*3/uL (ref 0.1–0.9)
Monocytes: 6 %
NEUTROS PCT: 49 %
Neutrophils Absolute: 3.8 10*3/uL (ref 1.4–7.0)
Platelets: 389 10*3/uL — ABNORMAL HIGH (ref 150–379)
RBC: 3.5 x10E6/uL — AB (ref 4.14–5.80)
RDW: 16 % — AB (ref 12.3–15.4)
WBC: 7.7 10*3/uL (ref 3.4–10.8)

## 2016-06-03 LAB — HEPATIC FUNCTION PANEL
ALT: 5 IU/L (ref 0–44)
AST: 15 IU/L (ref 0–40)
Albumin: 3.5 g/dL (ref 3.5–5.5)
Alkaline Phosphatase: 88 IU/L (ref 39–117)
Bilirubin Total: 0.4 mg/dL (ref 0.0–1.2)
Bilirubin, Direct: 0.13 mg/dL (ref 0.00–0.40)
TOTAL PROTEIN: 5.9 g/dL — AB (ref 6.0–8.5)

## 2016-06-11 ENCOUNTER — Other Ambulatory Visit: Payer: Self-pay | Admitting: Family Medicine

## 2016-06-21 ENCOUNTER — Encounter: Payer: Self-pay | Admitting: *Deleted

## 2016-06-21 ENCOUNTER — Encounter: Payer: Self-pay | Admitting: Cardiology

## 2016-06-21 ENCOUNTER — Ambulatory Visit (INDEPENDENT_AMBULATORY_CARE_PROVIDER_SITE_OTHER): Payer: Medicaid Other | Admitting: Cardiology

## 2016-06-21 ENCOUNTER — Telehealth: Payer: Self-pay | Admitting: Family Medicine

## 2016-06-21 VITALS — BP 133/84 | HR 94 | Ht 72.0 in | Wt 228.6 lb

## 2016-06-21 DIAGNOSIS — R0602 Shortness of breath: Secondary | ICD-10-CM

## 2016-06-21 DIAGNOSIS — R0789 Other chest pain: Secondary | ICD-10-CM | POA: Diagnosis not present

## 2016-06-21 NOTE — Patient Instructions (Signed)
Your physician recommends that you schedule a follow-up appointment in: 1 MONTH WITH DR BRANCH  Your physician recommends that you continue on your current medications as directed. Please refer to the Current Medication list given to you today.  Thank you for choosing [facility]!!    

## 2016-06-21 NOTE — Telephone Encounter (Signed)
Patient aware via detailed message

## 2016-06-21 NOTE — Progress Notes (Signed)
Clinical Summary [name] is a 46 y.o.male seen as new pateint, referred by Dr Wendi Snipes for chest pain.   1. Chest pain - tightness in throat, radiating in chest. +SOB. Tends to occur with activity. Can last several hours in a row. Can get diaphoretic.  - started inhaler, no improvement - admissions to both [place] and [facility] for symptoms. He left [facility] before workup complete. He states at [facility] he was told he had congestive HF. We do not have any echo results at this time from [place], unclear history - patient reports since regularly starting dialysis all his symptoms have resolved. Walking regularly up to 5 miles.  CAD risk factors: HTN, DM2, HL, ESRD, PAD s/p right BKA   2. ESRD - admit 04/2016 with hyperkalemia and volume overload - dialysis catheter placed at that time.  - just started on dialysis 9 days ago.  - can have some low bp's at HD.   Past Medical History:  Diagnosis Date  . Anemia   . Anxiety   . Blood transfusion without reported diagnosis   . Depression   . Diabetes mellitus without complication ([place])   . Hyperlipidemia   . Neuropathy   . PTSD (post-traumatic stress disorder)      Allergies  Allergen Reactions  . Tape Other (See Comments)    Pulls skin off     Current Outpatient Prescriptions  Medication Sig Dispense Refill  . albuterol (PROVENTIL HFA;VENTOLIN HFA) 108 (90 Base) MCG/ACT inhaler Inhale 1-2 puffs into the lungs every 6 (six) hours as needed for wheezing or shortness of breath.    [name] aspirin EC 81 MG tablet Take 81 mg by mouth daily.    [name] atorvastatin (LIPITOR) 10 MG tablet Take 1 tablet (10 mg total) by mouth every evening. 90 tablet 3  . B-D UF III MINI PEN NEEDLES 31G X 5 MM MISC USE AS DIRECTED DAILY 100 each 1  . baclofen (LIORESAL) 10 MG tablet Take 1 tablet (10 mg total) by mouth every 8 (eight) hours as needed for muscle spasms. 60 tablet 3  . blood glucose meter kit and supplies KIT Dispense based on patient  and insurance preference. Use up to four times daily as directed. (FOR ICD-9 250.00, 250.01). 1 each 0  . calcium carbonate (TUMS - DOSED IN MG ELEMENTAL CALCIUM) 500 MG chewable tablet Chew 3 tablets by mouth daily.    . carvedilol (COREG) 3.125 MG tablet Take 1 tablet (3.125 mg total) by mouth 2 (two) times daily with a meal. 60 tablet 3  . cholecalciferol (VITAMIN D) 1000 units tablet Take 1 tablet (1,000 Units total) by mouth daily. 90 tablet 1  . HYDROcodone-acetaminophen (NORCO/VICODIN) 5-325 MG tablet Take 1 tablet by mouth every 4 (four) hours as needed for moderate pain.    . Insulin Glargine (LANTUS SOLOSTAR) 100 UNIT/ML Solostar Pen Inject 10-15 Units into the skin daily at 10 pm. (Patient taking differently: Inject 10 Units into the skin every evening. ) 5 pen PRN  . polyethylene glycol powder (GLYCOLAX/MIRALAX) powder 1/2 to 2 capfuls daily for constipation. 3350 g 1   No current facility-administered medications for this visit.      Past Surgical History:  Procedure Laterality Date  . BELOW KNEE LEG AMPUTATION Right   . CHOLECYSTECTOMY    . FOOT SURGERY    . IR FLUORO GUIDE CV LINE RIGHT  05/16/2016  . IR REMOVAL TUN CV CATH W/O FL  05/16/2016  . IR  US GUIDE VASC ACCESS RIGHT  05/16/2016     Allergies  Allergen Reactions  . Tape Other (See Comments)    Pulls skin off      Family History  Problem Relation Age of Onset  . Cancer Mother        lung  . Diabetes Mother   . Heart attack Father   . Diabetes Father   . Diabetes Sister      Social History Mr. Prindle reports that he has never smoked. He has never used smokeless tobacco. Mr. Heffron reports that he does not drink alcohol.   Review of Systems CONSTITUTIONAL: No weight loss, fever, chills, weakness or fatigue.  HEENT: Eyes: No visual loss, blurred vision, double vision or yellow sclerae.No hearing loss, sneezing, congestion, runny nose or sore throat.  SKIN: No rash or itching.  CARDIOVASCULAR: per  hpi RESPIRATORY: No shortness of breath, cough or sputum.  GASTROINTESTINAL: No anorexia, nausea, vomiting or diarrhea. No abdominal pain or blood.  GENITOURINARY: No burning on urination, no polyuria NEUROLOGICAL: No headache, dizziness, syncope, paralysis, ataxia, numbness or tingling in the extremities. No change in bowel or bladder control.  MUSCULOSKELETAL: No muscle, back pain, joint pain or stiffness.  LYMPHATICS: No enlarged nodes. No history of splenectomy.  PSYCHIATRIC: No history of depression or anxiety.  ENDOCRINOLOGIC: No reports of sweating, cold or heat intolerance. No polyuria or polydipsia.  [name]   Physical Examination Vitals:   06/21/16 1332 06/21/16 1337  BP: 138/84 133/84  Pulse: 94 94   Vitals:   06/21/16 1332  Weight: 228 lb 9.6 oz (103.7 kg)  Height: 6' (1.829 m)    Gen: resting comfortably, no acute distress HEENT: no scleral icterus, pupils equal round and reactive, no palptable cervical adenopathy,  CV: RRR, no m/r/g, no jvd Resp: Clear to auscultation bilaterally GI: abdomen is soft, non-tender, non-distended, normal bowel sounds, no hepatosplenomegaly MSK: extremities are warm, no edema.  Skin: warm, no rash Neuro:  no focal deficits Psych: appropriate affect   Assessment and Plan  1. Chest pain/SOB - symptoms have resolved with regular dialysis and fluid removal - we will f/u echo results from his [facility] admission, pending LVEF may or may not warrant ischemic testing and additional medication changes   F/u 1 month      Arnoldo Lenis, M.D

## 2016-06-21 NOTE — Telephone Encounter (Signed)
Form signed  Laroy Apple, MD [facility] 06/21/2016, 1:09 PM

## 2016-06-25 ENCOUNTER — Emergency Department (HOSPITAL_COMMUNITY)
Admission: EM | Admit: 2016-06-25 | Discharge: 2016-06-25 | Disposition: A | Discharge status: Home / Self Care | Payer: Medicaid Other | Attending: Emergency Medicine | Admitting: Emergency Medicine

## 2016-06-25 ENCOUNTER — Encounter (HOSPITAL_COMMUNITY): Payer: Self-pay | Admitting: *Deleted

## 2016-06-25 ENCOUNTER — Emergency Department (HOSPITAL_COMMUNITY): Payer: Medicaid Other

## 2016-06-25 DIAGNOSIS — Z7982 Long term (current) use of aspirin: Secondary | ICD-10-CM | POA: Diagnosis not present

## 2016-06-25 DIAGNOSIS — E1122 Type 2 diabetes mellitus with diabetic chronic kidney disease: Secondary | ICD-10-CM | POA: Diagnosis not present

## 2016-06-25 DIAGNOSIS — Z79899 Other long term (current) drug therapy: Secondary | ICD-10-CM | POA: Diagnosis not present

## 2016-06-25 DIAGNOSIS — Z992 Dependence on renal dialysis: Secondary | ICD-10-CM | POA: Insufficient documentation

## 2016-06-25 DIAGNOSIS — N186 End stage renal disease: Secondary | ICD-10-CM | POA: Diagnosis not present

## 2016-06-25 DIAGNOSIS — Z794 Long term (current) use of insulin: Secondary | ICD-10-CM | POA: Diagnosis not present

## 2016-06-25 LAB — CBC WITH DIFFERENTIAL/PLATELET
BASOS ABS: 0 10*3/uL (ref 0.0–0.1)
BASOS PCT: 1 %
EOS ABS: 0.3 10*3/uL (ref 0.0–0.7)
Eosinophils Relative: 6 %
HCT: 33.6 % — ABNORMAL LOW (ref 39.0–52.0)
HEMOGLOBIN: 10.9 g/dL — AB (ref 13.0–17.0)
Lymphocytes Relative: 24 %
Lymphs Abs: 1.4 10*3/uL (ref 0.7–4.0)
MCH: 31.3 pg (ref 26.0–34.0)
MCHC: 32.4 g/dL (ref 30.0–36.0)
MCV: 96.6 fL (ref 78.0–100.0)
MONOS PCT: 6 %
Monocytes Absolute: 0.4 10*3/uL (ref 0.1–1.0)
NEUTROS ABS: 3.7 10*3/uL (ref 1.7–7.7)
NEUTROS PCT: 63 %
Platelets: 313 10*3/uL (ref 150–400)
RBC: 3.48 MIL/uL — ABNORMAL LOW (ref 4.22–5.81)
RDW: 15.7 % — AB (ref 11.5–15.5)
WBC: 5.9 10*3/uL (ref 4.0–10.5)

## 2016-06-25 LAB — COMPREHENSIVE METABOLIC PANEL
ALK PHOS: 80 U/L (ref 38–126)
ALT: 12 U/L — AB (ref 17–63)
ANION GAP: 10 (ref 5–15)
AST: 17 U/L (ref 15–41)
Albumin: 3.6 g/dL (ref 3.5–5.0)
BILIRUBIN TOTAL: 0.5 mg/dL (ref 0.3–1.2)
BUN: 48 mg/dL — ABNORMAL HIGH (ref 6–20)
CALCIUM: 8.8 mg/dL — AB (ref 8.9–10.3)
CO2: 26 mmol/L (ref 22–32)
CREATININE: 8.47 mg/dL — AB (ref 0.61–1.24)
Chloride: 102 mmol/L (ref 101–111)
GFR calc non Af Amer: 7 mL/min — ABNORMAL LOW (ref >60)
GFR, EST AFRICAN AMERICAN: 8 mL/min — AB (ref >60)
Glucose, Bld: 93 mg/dL (ref 65–99)
Potassium: 4.3 mmol/L (ref 3.5–5.1)
Sodium: 138 mmol/L (ref 135–145)
TOTAL PROTEIN: 6.5 g/dL (ref 6.5–8.1)

## 2016-06-25 LAB — ETHANOL

## 2016-06-25 IMAGING — DX DG CHEST 2V
2 series · 2 of 2 positions shown · non-contrast
Comparison: To [DATE]

CLINICAL DATA: Needs dialysis.

EXAM:
CHEST  2 VIEW

[chest pa]
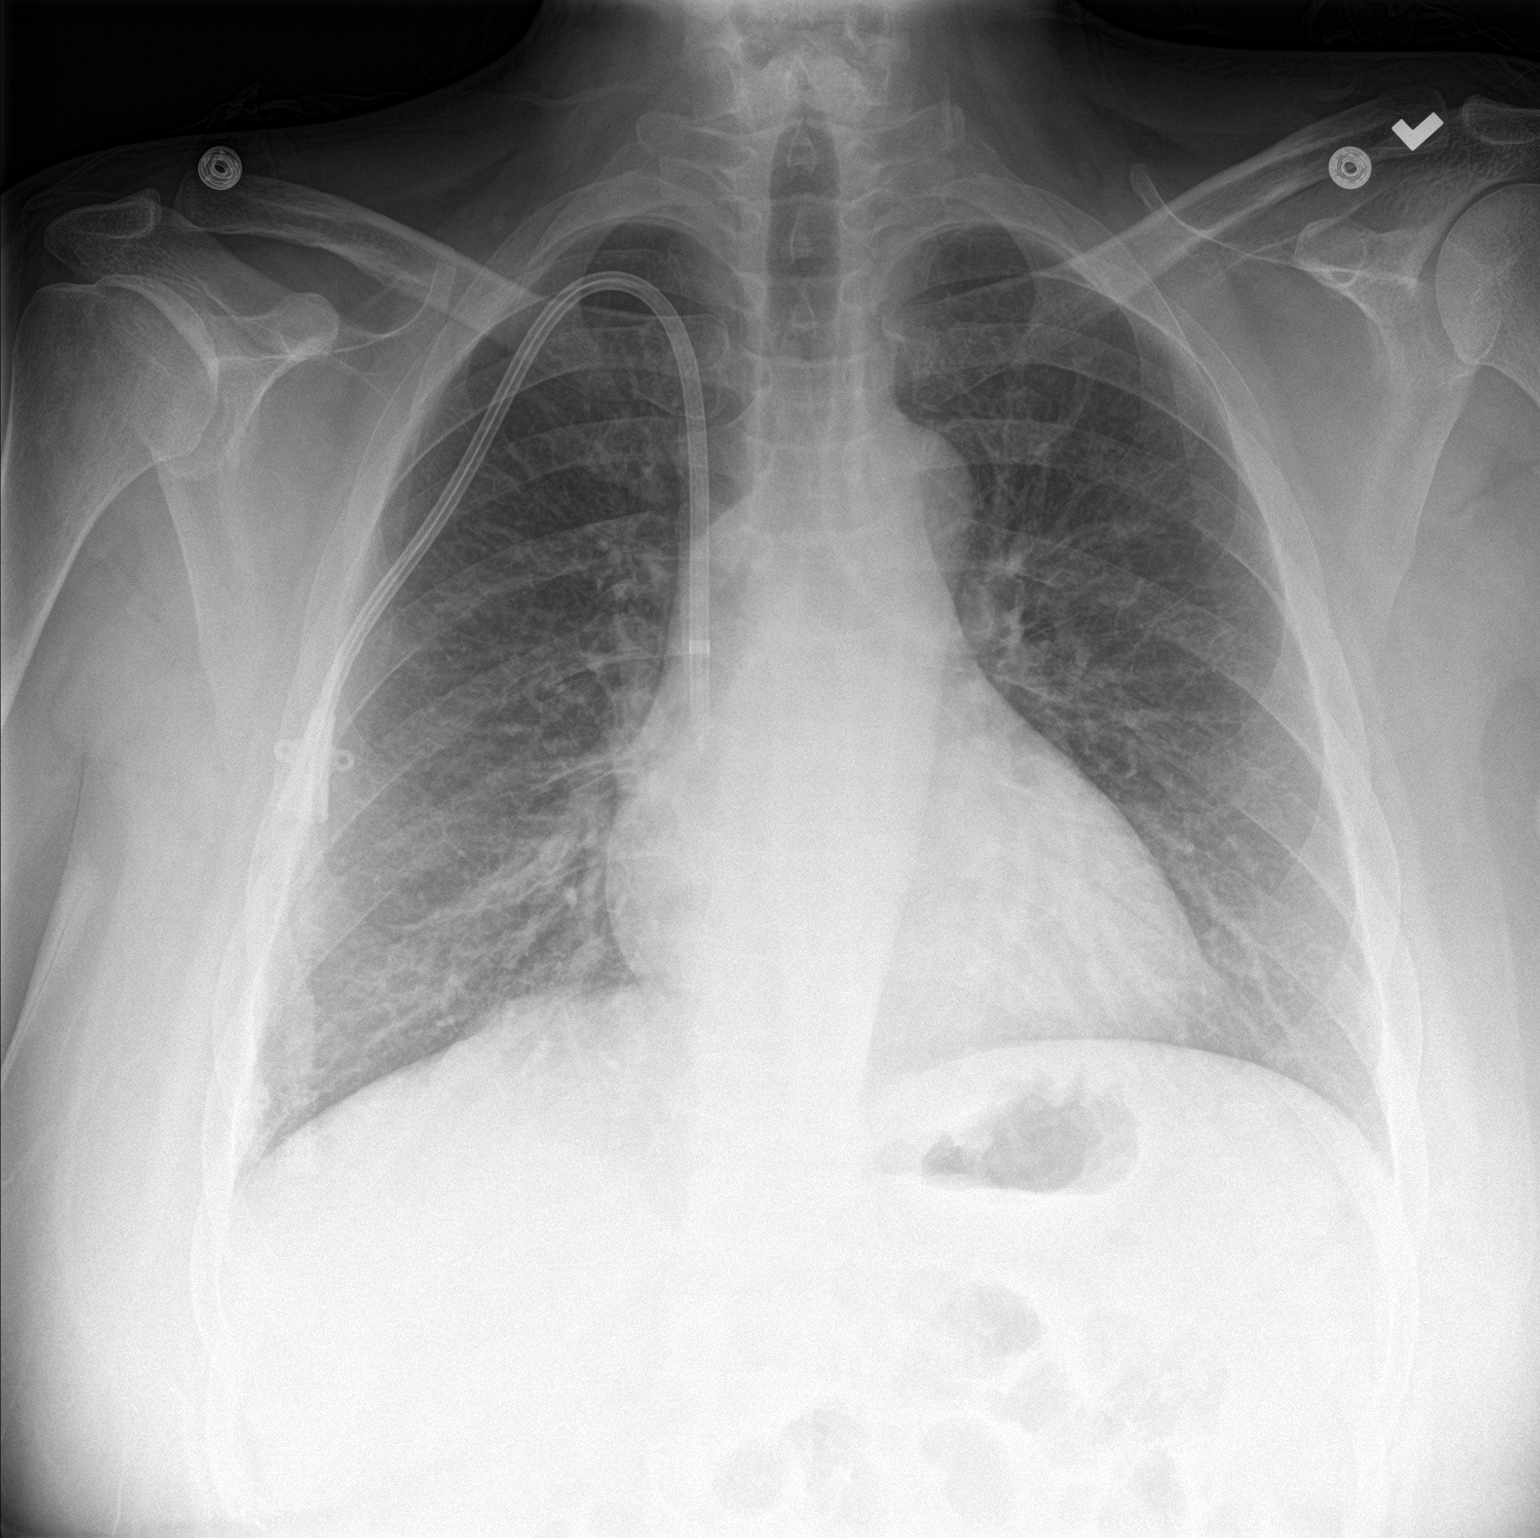

[chest lat]
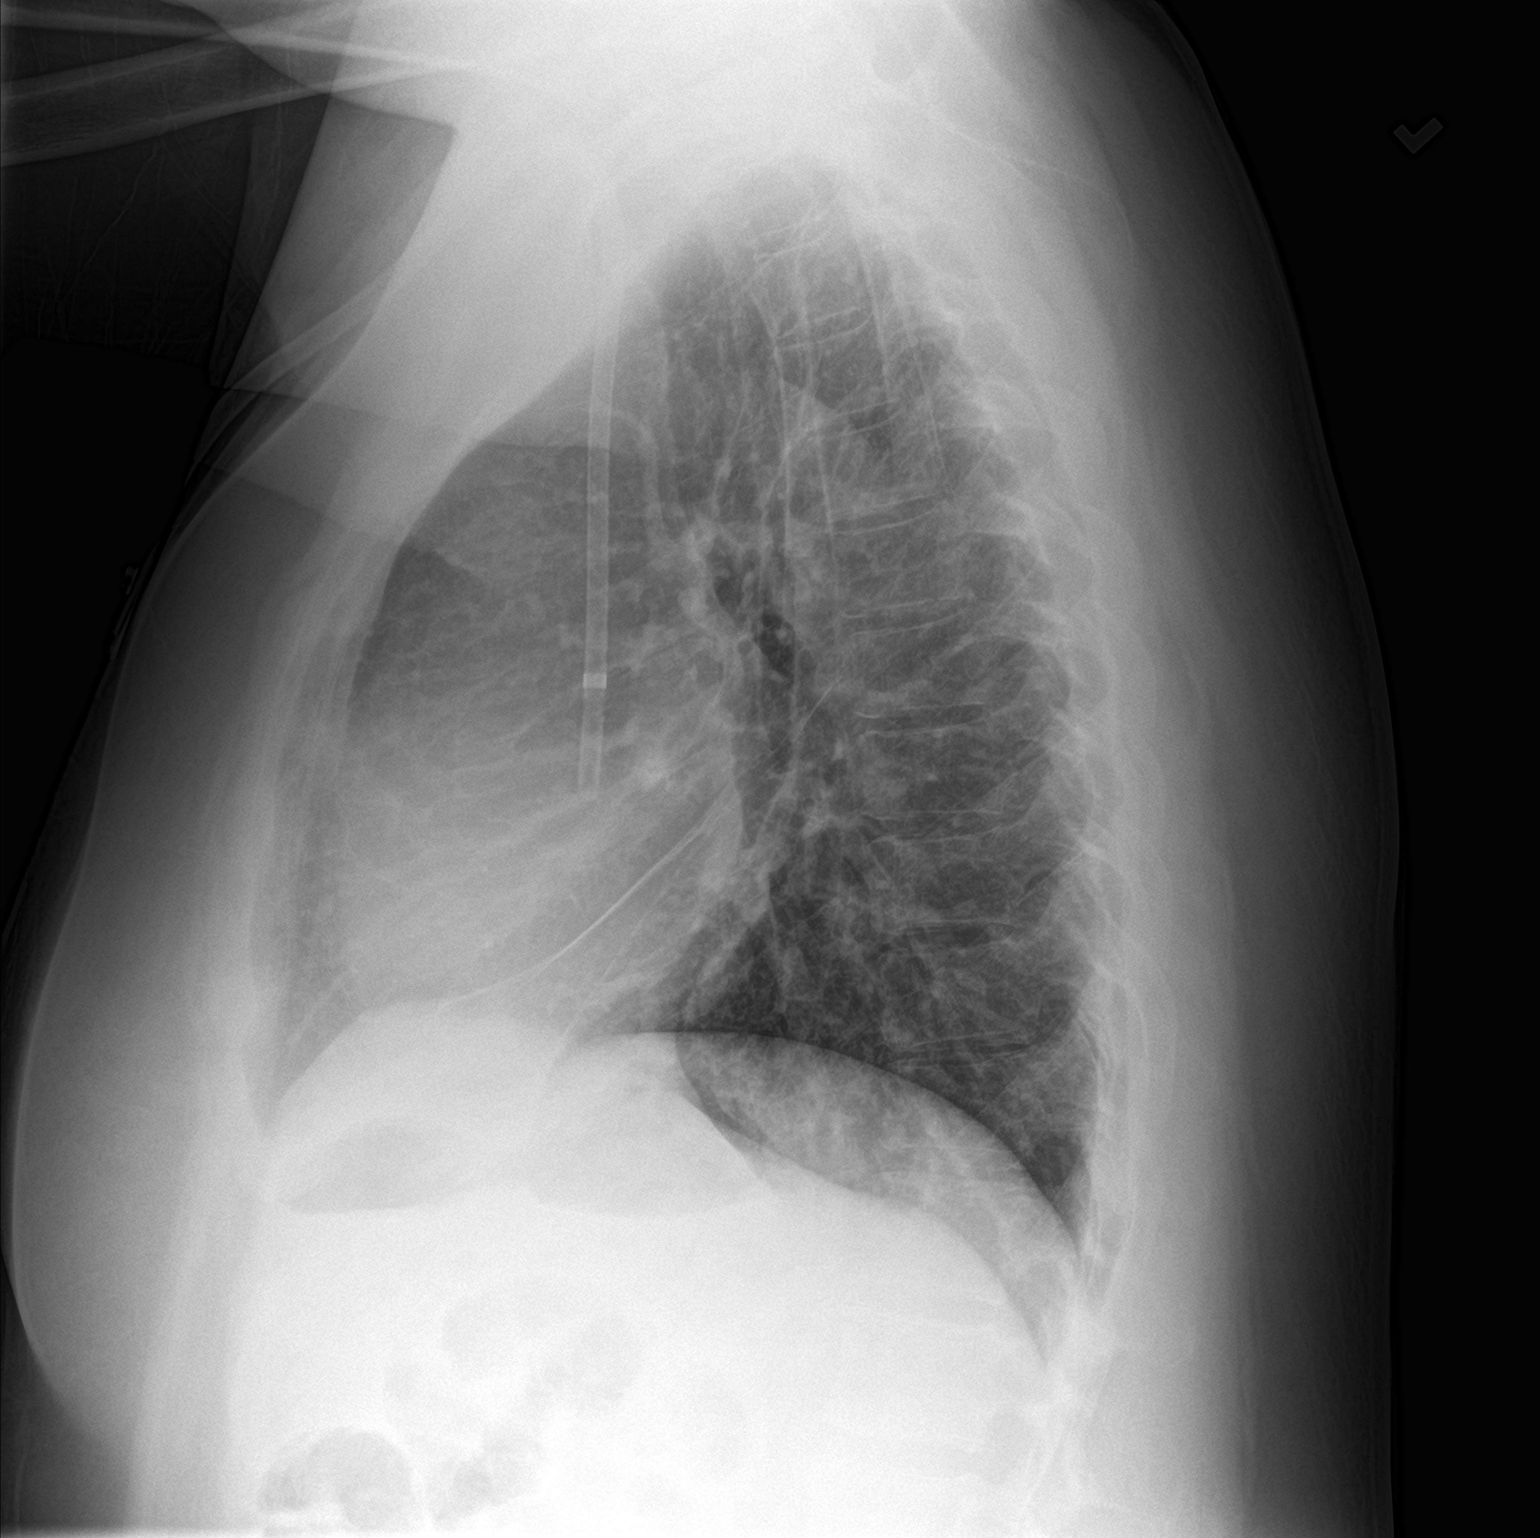

[2 of 2 positions shown; findings below may reference images not displayed]

FINDINGS: Right dialysis catheter tip is at the cavoatrial junction. Heart is
upper limits normal in size. No confluent airspace opacities or
effusions. No acute bony abnormality.
IMPRESSION: No active cardiopulmonary disease.

## 2016-06-25 NOTE — ED Notes (Addendum)
Patient refused wheelchair at discharge. States "I can walk." Patient dressed himself and ambulated with cane to waiting room to wait for cab to transport him to [facility] in [place], [place].

## 2016-06-25 NOTE — Discharge Instructions (Signed)
Dialysis available at the [facility] dialysis center today between 11 and 11:30. Go there for dialysis today. Today's labs require no emergent dialysis.

## 2016-06-25 NOTE — ED Notes (Signed)
Asked patient if he told EMS that he was having thoughts of harming himself, patient states "NO! I never said anything like that. Why would I want to hurt myself, I'm taking dialysis to help myself." Again I asked patient if he was having any SI or HI, patient states "no, I do NOT want to hurt myself or anyone else." [place].

## 2016-06-25 NOTE — ED Triage Notes (Signed)
Pt states he is mainly here to get dialysis because he can't remember the last time he received a full dialysis session; pt states Friday at dialysis while he was there he received a phone call that told him to come home immediately, so pt signed out of dialysis and went home;

## 2016-06-25 NOTE — ED Provider Notes (Signed)
Brendan Campbell DEPT Provider Note   CSN: [id number] Arrival date & time: 06/25/16  6712     History   Chief Complaint Chief Complaint  Patient presents with  . wants dialysis    HPI Brendan Campbell is a 46 y.o. male.  Patient brought in by EMS. Patient from the [place] area. Dialysis center is normally and eaten. Patient brought in for dialysis. Patient has no specific complaints. Other than that he was only dialyzed on Friday for 2 hours. Missed dialysis on Wednesday. Only had a brief period of dialysis on Monday. Asian has a history of being noncompliant with his dialysis. Patient has a history of posttraumatic stress disorder but denies any suicidal thoughts today. Patient denies shortness of breath. Dialysis catheters right upper chest.      Past Medical History:  Diagnosis Date  . Anemia   . Anxiety   . Blood transfusion without reported diagnosis   . Depression   . Diabetes mellitus without complication ([place])   . Hyperlipidemia   . Neuropathy   . PTSD (post-traumatic stress disorder)     Patient Active Problem List   Diagnosis Date Noted  . Chest pain 05/10/2016  . ESRD on dialysis ([place]) 05/10/2016  . Anemia due to end stage renal disease ([place]) 05/10/2016  . 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 Lumbar radiculopathy 03/08/2016  . 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 S/P below knee amputation, right ([place]) 03/05/2015  . Chronic renal insufficiency, stage IV (severe) ([place]) 03/04/2015  . DM2 (diabetes mellitus, type 2) ([place]) 03/03/2015  . HLD (hyperlipidemia) 03/03/2015  . Back pain 03/03/2015    Past Surgical History:  Procedure Laterality Date  . BELOW KNEE LEG AMPUTATION Right   . CHOLECYSTECTOMY    . FOOT SURGERY    . IR FLUORO GUIDE CV LINE RIGHT  05/16/2016  . IR REMOVAL TUN CV CATH W/O FL  05/16/2016  . IR US GUIDE VASC ACCESS RIGHT  05/16/2016       Home Medications    Prior to Admission medications   Medication Sig Start Date  End Date Taking? Authorizing Provider  albuterol (PROVENTIL HFA;VENTOLIN HFA) 108 (90 Base) MCG/ACT inhaler Inhale 1-2 puffs into the lungs every 6 (six) hours as needed for wheezing or shortness of breath.    [provider]  aspirin EC 81 MG tablet Take 81 mg by mouth daily.    [provider]  atorvastatin (LIPITOR) 10 MG tablet Take 1 tablet (10 mg total) by mouth every evening. 06/02/16   Timmothy Euler, MD  B-D UF III MINI PEN NEEDLES 31G X 5 MM MISC USE AS DIRECTED DAILY 06/13/16   Timmothy Euler, MD  baclofen (LIORESAL) 10 MG tablet Take 1 tablet (10 mg total) by mouth every 8 (eight) hours as needed for muscle spasms. 03/08/16   Meredith Staggers, MD  blood glucose meter kit and supplies KIT Dispense based on patient and insurance preference. Use up to four times daily as directed. (FOR ICD-9 250.00, 250.01). 06/02/16   Timmothy Euler, MD  calcium carbonate (TUMS - DOSED IN MG ELEMENTAL CALCIUM) 500 MG chewable tablet Chew 3 tablets by mouth daily.    [provider]  carvedilol (COREG) 3.125 MG tablet Take 1 tablet (3.125 mg total) by mouth 2 (two) times daily with a meal. 06/02/16   Timmothy Euler, MD  cholecalciferol (VITAMIN D) 1000 units tablet Take 1 tablet (1,000 Units total) by mouth daily. 06/02/16  Timmothy Euler, MD  HYDROcodone-acetaminophen (NORCO/VICODIN) 5-325 MG tablet Take 1 tablet by mouth every 4 (four) hours as needed for moderate pain.    [provider]  Insulin Glargine (LANTUS SOLOSTAR) 100 UNIT/ML Solostar Pen Inject 10-15 Units into the skin daily at 10 pm. Patient taking differently: Inject 10 Units into the skin every evening.  12/16/15   Timmothy Euler, MD  polyethylene glycol powder (GLYCOLAX/MIRALAX) powder 1/2 to 2 capfuls daily for constipation. 06/02/16   Timmothy Euler, MD    Family History Family History  Problem Relation Age of Onset  . Cancer Mother        lung  . Diabetes Mother   . Heart  attack Father   . Diabetes Father   . Diabetes Sister     Social History Social History  Substance Use Topics  . Smoking status: Never Smoker  . Smokeless tobacco: Never Used  . Alcohol use No     Allergies   Tape   Review of Systems Review of Systems  Constitutional: Negative for fever.  HENT: Negative for congestion.   Eyes: Negative for redness.  Respiratory: Negative for shortness of breath.   Cardiovascular: Negative for chest pain.  Gastrointestinal: Negative for abdominal pain.  Musculoskeletal: Negative for back pain.  Neurological: Negative for headaches.  Hematological: Bruises/bleeds easily.  Psychiatric/Behavioral: Negative for confusion and suicidal ideas.     Physical Exam Updated Vital Signs BP (!) 169/94 (BP Location: Left Arm)   Pulse 98   Temp 98.5 F (36.9 C) (Oral)   Resp 17   Ht 6' (1.829 m)   Wt 228 lb (103.4 kg)   SpO2 98%   BMI 30.92 kg/m   Physical Exam  Constitutional: He is oriented to person, place, and time. He appears well-developed and well-nourished. No distress.  HENT:  Head: Normocephalic and atraumatic.  Mouth/Throat: Oropharynx is clear and moist.  Eyes: Conjunctivae and EOM are normal. Pupils are equal, round, and reactive to light.  Neck: Normal range of motion. Neck supple.  Cardiovascular: Normal rate, regular rhythm and normal heart sounds.   Pulmonary/Chest: Effort normal and breath sounds normal.  Dialysis catheter right upper chest.  Abdominal: Soft. Bowel sounds are normal. There is no tenderness.  Musculoskeletal: Normal range of motion.  Right above-the-knee amputation.  Neurological: He is alert and oriented to person, place, and time. No cranial nerve deficit or sensory deficit. He exhibits normal muscle tone. Coordination normal.  Skin: Skin is warm.  Nursing note and vitals reviewed.    ED Treatments / Results  Labs (all labs ordered are listed, but only abnormal results are displayed) Labs Reviewed   COMPREHENSIVE METABOLIC PANEL - Abnormal; Notable for the following:       Result Value   BUN 48 (*)    Creatinine, Ser 8.47 (*)    Calcium 8.8 (*)    ALT 12 (*)    GFR calc non Af Amer 7 (*)    GFR calc Af Amer 8 (*)    All other components within normal limits  CBC WITH DIFFERENTIAL/PLATELET - Abnormal; Notable for the following:    RBC 3.48 (*)    Hemoglobin 10.9 (*)    HCT 33.6 (*)    RDW 15.7 (*)    All other components within normal limits  ETHANOL    EKG  EKG Interpretation None       Radiology Dg Chest 2 View  Result Date: 06/25/2016 CLINICAL DATA:  Needs dialysis. EXAM:  CHEST  2 VIEW COMPARISON:  To 05/10/2017 FINDINGS: Right dialysis catheter tip is at the cavoatrial junction. Heart is upper limits normal in size. No confluent airspace opacities or effusions. No acute bony abnormality. IMPRESSION: No active cardiopulmonary disease. Electronically Signed   By: Rolm Baptise M.D.   On: 06/25/2016 08:02    Procedures Procedures (including critical care time)  Medications Ordered in ED Medications - No data to display   Initial Impression / Assessment and Plan / ED Course  I have reviewed the triage vital signs and the nursing notes.  Pertinent labs & imaging results that were available during my care of the patient were reviewed by me and considered in my medical decision making (see chart for details).    Patient brought in by EMS this morning. Patient thinking that he needed dialysis.. Some contact with dialysis center because the PA that works with Dr. Wynelle Cleveland avocado new about him. Came to see him here. His labs are reasonable patient tells [place] that he was dialyzed on Friday for 2 hours. Did not have dialysis on Wednesday and was dialyzed for a brief period of time on Monday. Patient has a history of being very noncompliant. Patient's chest x-ray also without any significant fluid overload. Patient most likely stable to wait for dialysis on Monday. But they have  arranged for him to receive dialysis and eaten today at around 11:00.  Patient is not suicidal.  Patient states he wants to go to dialysis today. We will arrange a cab to take him there.  Final Clinical Impressions(s) / ED Diagnoses   Final diagnoses:  End stage renal disease on dialysis [facility])    New Prescriptions New Prescriptions   No medications on file     Fredia Sorrow, MD 06/25/16 [phone]
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
## 2016-06-25 NOTE — ED Notes (Signed)
Ramiro Harvest, PA with nephrology at bedside to speak with patient.

## 2016-06-30 ENCOUNTER — Ambulatory Visit: Payer: Medicaid Other | Admitting: Family Medicine

## 2016-07-05 ENCOUNTER — Other Ambulatory Visit: Payer: Self-pay | Admitting: Family Medicine

## 2016-07-07 ENCOUNTER — Ambulatory Visit: Payer: Medicaid Other | Admitting: Family Medicine

## 2016-07-08 ENCOUNTER — Encounter: Payer: Self-pay | Admitting: Family Medicine

## 2016-07-08 ENCOUNTER — Telehealth: Payer: Self-pay | Admitting: Family Medicine

## 2016-07-08 NOTE — Telephone Encounter (Signed)
Attempted to contact patient - NA °

## 2016-07-13 ENCOUNTER — Encounter: Payer: Self-pay | Admitting: Vascular Surgery

## 2016-07-19 ENCOUNTER — Ambulatory Visit (HOSPITAL_COMMUNITY): Payer: Medicaid Other

## 2016-07-19 ENCOUNTER — Other Ambulatory Visit (HOSPITAL_COMMUNITY): Payer: Medicaid Other

## 2016-07-19 ENCOUNTER — Encounter: Payer: Medicaid Other | Admitting: Vascular Surgery

## 2016-07-21 ENCOUNTER — Ambulatory Visit (INDEPENDENT_AMBULATORY_CARE_PROVIDER_SITE_OTHER): Payer: Medicaid Other | Admitting: Family Medicine

## 2016-07-21 ENCOUNTER — Encounter: Payer: Self-pay | Admitting: Family Medicine

## 2016-07-21 ENCOUNTER — Other Ambulatory Visit: Payer: Self-pay

## 2016-07-21 VITALS — BP 142/93 | HR 84 | Temp 97.0°F | Ht 73.0 in | Wt 222.6 lb

## 2016-07-21 DIAGNOSIS — Z992 Dependence on renal dialysis: Secondary | ICD-10-CM | POA: Diagnosis not present

## 2016-07-21 DIAGNOSIS — Z89511 Acquired absence of right leg below knee: Secondary | ICD-10-CM

## 2016-07-21 DIAGNOSIS — N186 End stage renal disease: Secondary | ICD-10-CM | POA: Diagnosis not present

## 2016-07-21 MED ORDER — INSULIN GLARGINE 100 UNIT/ML SOLOSTAR PEN: 10.0000 [IU] | PEN_INJECTOR | Freq: Every day | SUBCUTANEOUS | 99 refills | Status: DC

## 2016-07-21 NOTE — Progress Notes (Signed)
   HPI  Patient presents today to discuss end-stage renal disease.  Patient comes in with a prescription to get orthotics for his right BKA. He is walking much better, he needs revision.  Patient requests a new dialysis center, he states that he is doing much now that he is on dialysis. He denies dyspnea, chest pain, or fatigue. He is being considered for an AV fistula.  Patient has good news that he was able to purchase a home recently, he's been placed on disability and is now receiving checks.  PMH: Smoking status noted ROS: Per HPI  Objective: BP (!) 142/93   Pulse 84   Temp 97 F (36.1 C) (Oral)   Ht 6\' 1"  (1.854 m)   Wt 222 lb 9.6 oz (101 kg)   BMI 29.37 kg/m  Gen: NAD, alert, cooperative with exam HEENT: NCAT CV: RRR, good S1/S2, no murmur Resp: CTABL, no wheezes, non-labored Ext: Trace pitting edema left lower extremity, right BKA Neuro: Alert and oriented, No gross deficits  Assessment and plan:  # End stage renal disease on dialysis Per his request referring to new nephrologist, he would also like to request a new dialysis center. He will discuss this with them. Tolerating Ht well, feeling much better  # R BKA Rx written for revision of orthotic for R BKA to improve ADLs.    Laroy Apple, MD [facility] 07/21/2016, 2:15 PM

## 2016-07-25 ENCOUNTER — Encounter: Payer: Self-pay | Admitting: Cardiology

## 2016-07-25 ENCOUNTER — Ambulatory Visit (INDEPENDENT_AMBULATORY_CARE_PROVIDER_SITE_OTHER): Payer: Medicaid Other | Admitting: Cardiology

## 2016-07-25 ENCOUNTER — Encounter: Payer: Self-pay | Admitting: *Deleted

## 2016-07-25 VITALS — BP 146/86 | HR 85 | Ht 72.0 in | Wt 223.0 lb

## 2016-07-25 DIAGNOSIS — R0602 Shortness of breath: Secondary | ICD-10-CM

## 2016-07-25 DIAGNOSIS — R0789 Other chest pain: Secondary | ICD-10-CM | POA: Diagnosis not present

## 2016-07-25 NOTE — Patient Instructions (Addendum)

## 2016-07-25 NOTE — Progress Notes (Signed)
Clinical Summary Brendan Campbell is a 46 y.o.male seen today for follow up of the following medical problems.   1. Chest pain - tightness in throat, radiating in chest. +SOB. Tends to occur with activity. Can last several hours in a row. Can get diaphoretic.  - started inhaler, no improvement - admissions to both [place] and [facility] for symptoms. He left [facility] before workup complete. He states at [facility] he was told he had congestive HF. We do not have any echo results at this time from [place], unclear history - patient reports since regularly starting dialysis all his symptoms have resolved. Walking regularly up to 5 miles.  CAD risk factors: HTN, DM2, HL, ESRD, PAD s/p right BKA  - can walk up to mile without troubles - since last visit we touched base with [facility] hospital, turns out an echo was not acutally done during admission.     2. ESRD - admit 04/2016 with hyperkalemia and volume overload - dialysis catheter placed at that time.  - just started on dialysis 9 days ago.  - can have some low bp's at HD.  - goes MWF, mixed compliance.  - occasional low bp's during HD.  Past Medical History:  Diagnosis Date  . Anemia   . Anxiety   . Blood transfusion without reported diagnosis   . Depression   . Diabetes mellitus without complication ([place])   . Hyperlipidemia   . Neuropathy   . PTSD (post-traumatic stress disorder)      Allergies  Allergen Reactions  . Tape Other (See Comments)    Pulls skin off     Current Outpatient Prescriptions  Medication Sig Dispense Refill  . albuterol (PROVENTIL HFA;VENTOLIN HFA) 108 (90 Base) MCG/ACT inhaler Inhale 1-2 puffs into the lungs every 6 (six) hours as needed for wheezing or shortness of breath.    [name] aspirin EC 81 MG tablet Take 81 mg by mouth daily.    [name] atorvastatin (LIPITOR) 10 MG tablet Take 1 tablet (10 mg total) by mouth every evening. 90 tablet 3  . atorvastatin (LIPITOR) 20 MG tablet TAKE 1 TABLET BY MOUTH  EVERY DAY FOR HEART PROTECTION 30 tablet 4  . B-D UF III MINI PEN NEEDLES 31G X 5 MM MISC USE AS DIRECTED DAILY 100 each 1  . baclofen (LIORESAL) 10 MG tablet Take 1 tablet (10 mg total) by mouth every 8 (eight) hours as needed for muscle spasms. 60 tablet 3  . blood glucose meter kit and supplies KIT Dispense based on patient and insurance preference. Use up to four times daily as directed. (FOR ICD-9 250.00, 250.01). 1 each 0  . calcium carbonate (TUMS - DOSED IN MG ELEMENTAL CALCIUM) 500 MG chewable tablet Chew 3 tablets by mouth daily.    . carvedilol (COREG) 3.125 MG tablet Take 1 tablet (3.125 mg total) by mouth 2 (two) times daily with a meal. 60 tablet 3  . Insulin Glargine (LANTUS SOLOSTAR) 100 UNIT/ML Solostar Pen Inject 10-15 Units into the skin daily at 10 pm. 5 pen PRN  . polyethylene glycol powder (GLYCOLAX/MIRALAX) powder 1/2 to 2 capfuls daily for constipation. 3350 g 1   No current facility-administered medications for this visit.      Past Surgical History:  Procedure Laterality Date  . BELOW KNEE LEG AMPUTATION Right   . CHOLECYSTECTOMY    . FOOT SURGERY    . IR FLUORO GUIDE CV LINE RIGHT  05/16/2016  . IR REMOVAL TUN CV CATH  W/O FL  05/16/2016  . IR US GUIDE VASC ACCESS RIGHT  05/16/2016     Allergies  Allergen Reactions  . Tape Other (See Comments)    Pulls skin off      Family History  Problem Relation Age of Onset  . Cancer Mother        lung  . Diabetes Mother   . Heart attack Father   . Diabetes Father   . Diabetes Sister      Social History Mr. Kye reports that he has never smoked. He has never used smokeless tobacco. Mr. Suazo reports that he does not drink alcohol.   Review of Systems CONSTITUTIONAL: No weight loss, fever, chills, weakness or fatigue.  HEENT: Eyes: No visual loss, blurred vision, double vision or yellow sclerae.No hearing loss, sneezing, congestion, runny nose or sore throat.  SKIN: No rash or itching.  CARDIOVASCULAR: per  hpi RESPIRATORY: No shortness of breath, cough or sputum.  GASTROINTESTINAL: No anorexia, nausea, vomiting or diarrhea. No abdominal pain or blood.  GENITOURINARY: No burning on urination, no polyuria NEUROLOGICAL: No headache, dizziness, syncope, paralysis, ataxia, numbness or tingling in the extremities. No change in bowel or bladder control.  MUSCULOSKELETAL: No muscle, back pain, joint pain or stiffness.  LYMPHATICS: No enlarged nodes. No history of splenectomy.  PSYCHIATRIC: No history of depression or anxiety.  ENDOCRINOLOGIC: No reports of sweating, cold or heat intolerance. No polyuria or polydipsia.  [name]   Physical Examination Vitals:   07/25/16 1322  BP: (!) 146/86  Pulse: 85   Vitals:   07/25/16 1322  Weight: 223 lb (101.2 kg)  Height: 6' (1.829 m)    Gen: resting comfortably, no acute distress HEENT: no scleral icterus, pupils equal round and reactive, no palptable cervical adenopathy,  CV: RRR, no m/r/g ,no jvd Resp: Clear to auscultation bilaterally GI: abdomen is soft, non-tender, non-distended, normal bowel sounds, no hepatosplenomegaly MSK: extremities are warm, no edema.  Skin: warm, no rash Neuro:  no focal deficits Psych: appropriate affect   Diagnostic Studies     Assessment and Plan  1. Chest pain/SOB - symptoms have resolved with regular dialysis and fluid removal - we will order an echo to evaluate for any cardiac dysfunction that contributed - he reports prior stress test at [facility] in [place], will request results. With resoluation of prior chest pain no plans for repeat ischemic testing at this time   Request labs from [facility] in [place]   F/u 6 months      Arnoldo Lenis, M.D., F.A.C.C.

## 2016-08-04 ENCOUNTER — Other Ambulatory Visit: Payer: Medicaid Other

## 2016-08-11 ENCOUNTER — Other Ambulatory Visit: Payer: Medicaid Other

## 2016-08-18 ENCOUNTER — Other Ambulatory Visit (HOSPITAL_COMMUNITY): Payer: Self-pay | Admitting: Nephrology

## 2016-08-18 DIAGNOSIS — N186 End stage renal disease: Secondary | ICD-10-CM

## 2016-08-18 DIAGNOSIS — Z992 Dependence on renal dialysis: Principal | ICD-10-CM

## 2016-08-23 ENCOUNTER — Other Ambulatory Visit: Payer: Self-pay | Admitting: Radiology

## 2016-08-24 ENCOUNTER — Other Ambulatory Visit: Payer: Self-pay | Admitting: General Surgery

## 2016-08-25 ENCOUNTER — Encounter (HOSPITAL_COMMUNITY): Payer: Self-pay

## 2016-08-25 ENCOUNTER — Ambulatory Visit (HOSPITAL_COMMUNITY)
Admission: RE | Admit: 2016-08-25 | Discharge: 2016-08-25 | Disposition: A | Discharge status: Home / Self Care | Payer: Medicaid Other | Source: Ambulatory Visit | Attending: Nephrology | Admitting: Nephrology

## 2016-08-26 ENCOUNTER — Encounter: Payer: Self-pay | Admitting: Vascular Surgery

## 2016-09-01 ENCOUNTER — Ambulatory Visit: Payer: Medicaid Other | Admitting: Family Medicine

## 2016-09-02 ENCOUNTER — Encounter: Payer: Self-pay | Admitting: Vascular Surgery

## 2016-09-02 ENCOUNTER — Ambulatory Visit (INDEPENDENT_AMBULATORY_CARE_PROVIDER_SITE_OTHER): Payer: Medicaid Other | Admitting: Vascular Surgery

## 2016-09-02 ENCOUNTER — Ambulatory Visit (HOSPITAL_COMMUNITY)
Admission: RE | Admit: 2016-09-02 | Discharge: 2016-09-02 | Disposition: A | Discharge status: Home / Self Care | Payer: Medicaid Other | Source: Ambulatory Visit | Attending: Vascular Surgery | Admitting: Vascular Surgery

## 2016-09-02 ENCOUNTER — Ambulatory Visit (INDEPENDENT_AMBULATORY_CARE_PROVIDER_SITE_OTHER)
Admission: RE | Admit: 2016-09-02 | Discharge: 2016-09-02 | Disposition: A | Discharge status: Home / Self Care | Payer: Medicaid Other | Source: Ambulatory Visit | Attending: Vascular Surgery | Admitting: Vascular Surgery

## 2016-09-02 VITALS — BP 137/89 | HR 77 | Temp 97.0°F | Resp 18 | Ht 72.0 in | Wt 225.0 lb

## 2016-09-02 DIAGNOSIS — Z992 Dependence on renal dialysis: Secondary | ICD-10-CM | POA: Diagnosis not present

## 2016-09-02 DIAGNOSIS — N186 End stage renal disease: Secondary | ICD-10-CM | POA: Insufficient documentation

## 2016-09-02 DIAGNOSIS — Z01810 Encounter for preprocedural cardiovascular examination: Secondary | ICD-10-CM

## 2016-09-02 NOTE — Progress Notes (Signed)
[facility] [place]  Patient name: Brendan Campbell MRN: [id number] DOB: 09/12/1970 Sex: male  REASON FOR VISIT: permanent dialysis access, requesting provider Dr. Hinda Lenis  HPI:    Brendan Campbell is a 46 y.o. male, who presents for permanent dialysis access. The patient is currently dialyzing via a right IJ tunneled dialysis catheter on Mondays, Wednesdays and Fridays. The patient started dialysis back in April. He is right-handed. He has never had access procedures before.  His past medical history includes diabetes. 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 He is ambulating with a prosthetic and a cane. He also has diabetic retinopathy and also peripheral neuropathy.  He reports having congestive heart failure. He denies any other cardiac issues. He denies having chest pain. He is not on any blood thinners.  PAST MEDICAL HISTORY:   Past Medical History:  Diagnosis Date  . Anemia   . Anxiety   . Blood transfusion without reported diagnosis   . Depression   . Diabetes mellitus without complication ([place])   . Hyperlipidemia   . Neuropathy   . PTSD (post-traumatic stress disorder)     Family History  Problem Relation Age of Onset  . Cancer Mother        lung  . Diabetes Mother   . Heart attack Father   . Diabetes Father   . Diabetes Sister     SOCIAL HISTORY:   Social History   Social History  . Marital status: Single    Spouse name: N/A  . Number of children: N/A  . Years of education: N/A   Occupational History  . Not on file.   Social History Main Topics  . Smoking status: Former Smoker    Quit date: 09/03/2006  . Smokeless tobacco: Never Used  . Alcohol use No  . Drug use: No  . Sexual activity: Not on file   Other Topics Concern  . Not on file   Social History Narrative  . No narrative on file    Allergies  Allergen Reactions  . Tape Other (See Comments)    Pulls skin off    Current  Outpatient Prescriptions  Medication Sig Dispense Refill  . albuterol (PROVENTIL HFA;VENTOLIN HFA) 108 (90 Base) MCG/ACT inhaler Inhale 1-2 puffs into the lungs every 6 (six) hours as needed for wheezing or shortness of breath.    [name] aspirin EC 81 MG tablet Take 81 mg by mouth daily.    [name] atorvastatin (LIPITOR) 10 MG tablet Take 1 tablet (10 mg total) by mouth every evening. 90 tablet 3  . B-D UF III MINI PEN NEEDLES 31G X 5 MM MISC USE AS DIRECTED DAILY 100 each 1  . baclofen (LIORESAL) 10 MG tablet Take 1 tablet (10 mg total) by mouth every 8 (eight) hours as needed for muscle spasms. 60 tablet 3  . blood glucose meter kit and supplies KIT Dispense based on patient and insurance preference. Use up to four times daily as directed. (FOR ICD-9 250.00, 250.01). 1 each 0  . calcium carbonate (TUMS - DOSED IN MG ELEMENTAL CALCIUM) 500 MG chewable tablet Chew 3 tablets by mouth daily.    . carvedilol (COREG) 3.125 MG tablet Take 1 tablet (3.125 mg total) by mouth 2 (two) times daily with a meal. 60 tablet 3  . Insulin Glargine (LANTUS SOLOSTAR) 100 UNIT/ML Solostar Pen Inject 10-15 Units into the skin daily at 10 pm. 5 pen PRN  .  polyethylene glycol powder (GLYCOLAX/MIRALAX) powder 1/2 to 2 capfuls daily for constipation. 3350 g 1   No current facility-administered medications for this visit.     REVIEW OF SYSTEMS:   REVIEW OF SYSTEMS (negative unless checked):   Cardiac:  []  Chest pain or chest pressure? []  Shortness of breath upon activity? []  Shortness of breath when lying flat? []  Irregular heart rhythm?  Vascular:  []  Pain in calf, thigh, or hip brought on by walking? []  Pain in feet at night that wakes you up from your sleep? []  Blood clot in your veins? [x]  Leg swelling?  Pulmonary:  []  Oxygen at home? []  Productive cough? []  Wheezing?  Neurologic:  []  Sudden weakness in arms or legs? []  Sudden numbness in arms or legs? []  Sudden onset of difficult speaking or slurred  speech? [x]  Temporary loss of vision in one eye? Sees spots in left eye intermittent. Never loses vision.  []  Problems with dizziness?  Gastrointestinal:  []  Blood in stool? []  Vomited blood?  Genitourinary:  []  Burning when urinating? []  Blood in urine?  Psychiatric:  []  Major depression  Hematologic:  []  Bleeding problems? []  Problems with blood clotting?  Dermatologic:  []  Rashes or ulcers?  Constitutional:  []  Fever or chills?  Ear/Nose/Throat:  []  Change in hearing? []  Nose bleeds? []  Sore throat?  Musculoskeletal:  []  Back pain? []  Joint pain? []  Muscle pain?  PHYSICAL EXAM:   Vitals:   09/02/16 1616  BP: 137/89  Pulse: 77  Resp: 18  Temp: (!) 97 F (36.1 C)  TempSrc: Oral  SpO2: 98%  Weight: 225 lb (102.1 kg)  Height: 6' (1.829 m)    GENERAL: The patient is a well-nourished male, in no acute distress. The vital signs are documented above. CARDIAC: There is a regular rate and rhythm. No carotid bruits. VASCULAR: Palpable radial and brachial pulses bilaterally. PULMONARY: There is good air exchange bilaterally without wheezing or rales. MUSCULOSKELETAL: Right BKA. Prosthetic present. NEUROLOGIC: No focal weakness or paresthesias are detected. SKIN: There are no ulcers or rashes noted. PSYCHIATRIC: The patient has a normal affect.  DATA:    Upper extremity vein mapping 37/05/8887  Small cephalic vein conduits bilaterally. Adequate basilic vein conduits bilaterally.  Upper extremity arterial duplex 08/23/2016  Triphasic waveforms bilaterally. The left brachial artery bifurcates in the proximal upper arm. The right brachial artery bifurcates at the antecubital fossa level.   ASSESSMENT/PLAN:   ESRD on HD  46yo male currently dialyzing via a right IJ tunneled dialysis catheter on Mondays, Wednesdays and Fridays. He is right-handed. It appears that he may be candidate for a left basilic vein transposition. This will be done in stages. If his  vein appears to be too small at the time of surgery, we will place a graft. The procedures, risks and benefits were discussed at length with the patient and he is willing to proceed. This will be performed on a nondialysis day.   Wali Reinheimer C. Donzetta Matters, MD [facility] [place] Office: [phone] Pager: [phone]

## 2016-09-05 ENCOUNTER — Encounter: Payer: Self-pay | Admitting: Family Medicine

## 2016-09-14 ENCOUNTER — Other Ambulatory Visit: Payer: Self-pay | Admitting: Cardiology

## 2016-09-14 DIAGNOSIS — R0602 Shortness of breath: Secondary | ICD-10-CM

## 2016-09-15 ENCOUNTER — Other Ambulatory Visit: Payer: Medicaid Other

## 2016-09-15 ENCOUNTER — Other Ambulatory Visit: Payer: Self-pay

## 2016-09-20 ENCOUNTER — Encounter (HOSPITAL_COMMUNITY): Payer: Self-pay | Admitting: *Deleted

## 2016-09-20 NOTE — Progress Notes (Signed)
Patient reports that he is going to be riding transportation in and does not have anyone to stay with him after surgery will leave for follow up tomorrow. Ask patient to decrease Wednesday's night dose of lantus to 5 units.

## 2016-09-22 ENCOUNTER — Encounter (HOSPITAL_COMMUNITY): Payer: Self-pay

## 2016-09-22 ENCOUNTER — Ambulatory Visit (HOSPITAL_COMMUNITY): Payer: Medicaid Other | Admitting: Certified Registered Nurse Anesthetist

## 2016-09-22 ENCOUNTER — Telehealth: Payer: Self-pay | Admitting: Vascular Surgery

## 2016-09-22 ENCOUNTER — Encounter (HOSPITAL_COMMUNITY)
Admission: RE | Disposition: A | Discharge status: Home / Self Care | Payer: Self-pay | Source: Ambulatory Visit | Attending: Vascular Surgery

## 2016-09-22 ENCOUNTER — Ambulatory Visit (HOSPITAL_COMMUNITY)
Admission: RE | Admit: 2016-09-22 | Discharge: 2016-09-22 | Disposition: A | Discharge status: Home / Self Care | Payer: Medicaid Other | Source: Ambulatory Visit | Attending: Vascular Surgery | Admitting: Vascular Surgery

## 2016-09-22 DIAGNOSIS — N186 End stage renal disease: Secondary | ICD-10-CM | POA: Insufficient documentation

## 2016-09-22 DIAGNOSIS — E1151 Type 2 diabetes mellitus with diabetic peripheral angiopathy without gangrene: Secondary | ICD-10-CM | POA: Insufficient documentation

## 2016-09-22 DIAGNOSIS — Z87891 Personal history of nicotine dependence: Secondary | ICD-10-CM | POA: Insufficient documentation

## 2016-09-22 DIAGNOSIS — E1122 Type 2 diabetes mellitus with diabetic chronic kidney disease: Secondary | ICD-10-CM | POA: Insufficient documentation

## 2016-09-22 DIAGNOSIS — Z89511 Acquired absence of right leg below knee: Secondary | ICD-10-CM | POA: Diagnosis not present

## 2016-09-22 DIAGNOSIS — Z992 Dependence on renal dialysis: Secondary | ICD-10-CM | POA: Diagnosis not present

## 2016-09-22 DIAGNOSIS — E11319 Type 2 diabetes mellitus with unspecified diabetic retinopathy without macular edema: Secondary | ICD-10-CM | POA: Diagnosis not present

## 2016-09-22 DIAGNOSIS — E1142 Type 2 diabetes mellitus with diabetic polyneuropathy: Secondary | ICD-10-CM | POA: Diagnosis not present

## 2016-09-22 DIAGNOSIS — I509 Heart failure, unspecified: Secondary | ICD-10-CM | POA: Diagnosis not present

## 2016-09-22 DIAGNOSIS — N185 Chronic kidney disease, stage 5: Secondary | ICD-10-CM | POA: Diagnosis not present

## 2016-09-22 DIAGNOSIS — Z794 Long term (current) use of insulin: Secondary | ICD-10-CM | POA: Diagnosis not present

## 2016-09-22 HISTORY — DX: Peripheral vascular disease, unspecified: I73.9

## 2016-09-22 HISTORY — DX: End stage renal disease: N18.6

## 2016-09-22 HISTORY — DX: Heart failure, unspecified: I50.9

## 2016-09-22 HISTORY — PX: AV FISTULA PLACEMENT: SHX1204

## 2016-09-22 HISTORY — DX: Chronic kidney disease, unspecified: N18.9

## 2016-09-22 LAB — POCT I-STAT 4, (NA,K, GLUC, HGB,HCT)
Glucose, Bld: 85 mg/dL (ref 65–99)
HCT: 38 % — ABNORMAL LOW (ref 39.0–52.0)
HEMOGLOBIN: 12.9 g/dL — AB (ref 13.0–17.0)
POTASSIUM: 5.9 mmol/L — AB (ref 3.5–5.1)
SODIUM: 142 mmol/L (ref 135–145)

## 2016-09-22 LAB — GLUCOSE, CAPILLARY: Glucose-Capillary: 74 mg/dL (ref 65–99)

## 2016-09-22 SURGERY — ARTERIOVENOUS (AV) FISTULA CREATION
Anesthesia: Monitor Anesthesia Care | Site: Arm Lower | Laterality: Left

## 2016-09-22 MED ORDER — LIDOCAINE-EPINEPHRINE (PF) 1 %-1:200000 IJ SOLN
INTRAMUSCULAR | Status: DC | PRN
  Administered 2016-09-22: 6 mL

## 2016-09-22 MED ORDER — SODIUM CHLORIDE 0.9 % IV SOLN
INTRAVENOUS | Status: DC
  Administered 2016-09-22: 10:00:00 via INTRAVENOUS

## 2016-09-22 MED ORDER — LIDOCAINE HCL (CARDIAC) 20 MG/ML IV SOLN
INTRAVENOUS | Status: DC | PRN
  Administered 2016-09-22: 20 mg via INTRAVENOUS

## 2016-09-22 MED ORDER — PHENYLEPHRINE HCL 10 MG/ML IJ SOLN
INTRAMUSCULAR | Status: DC | PRN
  Administered 2016-09-22: 25 ug/min via INTRAVENOUS

## 2016-09-22 MED ORDER — ONDANSETRON HCL 4 MG/2ML IJ SOLN
INTRAMUSCULAR | Status: AC
  Filled 2016-09-22: qty 2

## 2016-09-22 MED ORDER — DEXTROSE 5 % IV SOLN
1.5000 g | INTRAVENOUS | Status: AC
  Administered 2016-09-22: 1.5 g via INTRAVENOUS
  Filled 2016-09-22: qty 1.5

## 2016-09-22 MED ORDER — 0.9 % SODIUM CHLORIDE (POUR BTL) OPTIME
TOPICAL | Status: DC | PRN
  Administered 2016-09-22: 1000 mL

## 2016-09-22 MED ORDER — ONDANSETRON HCL 4 MG/2ML IJ SOLN
INTRAMUSCULAR | Status: DC | PRN
Start: 2016-09-22 — End: 2016-09-22
  Administered 2016-09-22: 4 mg via INTRAVENOUS

## 2016-09-22 MED ORDER — PROPOFOL 500 MG/50ML IV EMUL
INTRAVENOUS | Status: DC | PRN
  Administered 2016-09-22: 75 ug/kg/min via INTRAVENOUS

## 2016-09-22 MED ORDER — SODIUM CHLORIDE 0.9 % IV SOLN: INTRAVENOUS | Status: DC

## 2016-09-22 MED ORDER — FENTANYL CITRATE (PF) 250 MCG/5ML IJ SOLN
INTRAMUSCULAR | Status: DC | PRN
  Administered 2016-09-22 (×2): 50 ug via INTRAVENOUS

## 2016-09-22 MED ORDER — SODIUM CHLORIDE 0.9 % IV SOLN
INTRAVENOUS | Status: DC | PRN
  Administered 2016-09-22: 500 mL

## 2016-09-22 MED ORDER — LIDOCAINE-EPINEPHRINE (PF) 1 %-1:200000 IJ SOLN
INTRAMUSCULAR | Status: AC
  Filled 2016-09-22: qty 30

## 2016-09-22 MED ORDER — CEFAZOLIN SODIUM-DEXTROSE 2-4 GM/100ML-% IV SOLN: 2.0000 g | INTRAVENOUS | Status: DC

## 2016-09-22 MED ORDER — FENTANYL CITRATE (PF) 250 MCG/5ML IJ SOLN
INTRAMUSCULAR | Status: AC
  Filled 2016-09-22: qty 5

## 2016-09-22 MED ORDER — PROPOFOL 10 MG/ML IV BOLUS
INTRAVENOUS | Status: DC | PRN
  Administered 2016-09-22: 20 mg via INTRAVENOUS

## 2016-09-22 MED ORDER — OXYCODONE-ACETAMINOPHEN 5-325 MG PO TABS: 1.0000 | ORAL_TABLET | Freq: Four times a day (QID) | ORAL | 0 refills | Status: DC | PRN

## 2016-09-22 MED ORDER — CHLORHEXIDINE GLUCONATE CLOTH 2 % EX PADS: 6.0000 | MEDICATED_PAD | Freq: Once | CUTANEOUS | Status: DC

## 2016-09-22 SURGICAL SUPPLY — 25 items
ARMBAND PINK RESTRICT EXTREMIT (MISCELLANEOUS) ×2 IMPLANT
CANISTER SUCT 3000ML PPV (MISCELLANEOUS) ×2 IMPLANT
CLIP VESOCCLUDE MED 6/CT (CLIP) ×2 IMPLANT
CLIP VESOCCLUDE SM WIDE 6/CT (CLIP) ×2 IMPLANT
COVER PROBE W GEL 5X96 (DRAPES) ×2 IMPLANT
DERMABOND ADVANCED (GAUZE/BANDAGES/DRESSINGS) ×1
DERMABOND ADVANCED .7 DNX12 (GAUZE/BANDAGES/DRESSINGS) ×1 IMPLANT
ELECT REM PT RETURN 9FT ADLT (ELECTROSURGICAL) ×2
ELECTRODE REM PT RTRN 9FT ADLT (ELECTROSURGICAL) ×1 IMPLANT
GLOVE BIO SURGEON STRL SZ7.5 (GLOVE) ×2 IMPLANT
GOWN STRL REUS W/ TWL LRG LVL3 (GOWN DISPOSABLE) ×3 IMPLANT
GOWN STRL REUS W/ TWL XL LVL3 (GOWN DISPOSABLE) ×2 IMPLANT
GOWN STRL REUS W/TWL LRG LVL3 (GOWN DISPOSABLE) ×3
GOWN STRL REUS W/TWL XL LVL3 (GOWN DISPOSABLE) ×2
KIT BASIN OR (CUSTOM PROCEDURE TRAY) ×2 IMPLANT
KIT ROOM TURNOVER OR (KITS) ×2 IMPLANT
NS IRRIG 1000ML POUR BTL (IV SOLUTION) ×2 IMPLANT
PACK CV ACCESS (CUSTOM PROCEDURE TRAY) ×2 IMPLANT
PAD ARMBOARD 7.5X6 YLW CONV (MISCELLANEOUS) ×4 IMPLANT
SUT MNCRL AB 4-0 PS2 18 (SUTURE) ×2 IMPLANT
SUT PROLENE 6 0 BV (SUTURE) ×4 IMPLANT
SUT VIC AB 3-0 SH 27 (SUTURE) ×1
SUT VIC AB 3-0 SH 27X BRD (SUTURE) ×1 IMPLANT
UNDERPAD 30X30 (UNDERPADS AND DIAPERS) ×2 IMPLANT
WATER STERILE IRR 1000ML POUR (IV SOLUTION) ×2 IMPLANT

## 2016-09-22 NOTE — Anesthesia Postprocedure Evaluation (Signed)
Anesthesia Post Note  Patient: Brendan Campbell  Procedure(s) Performed: Procedure(s) (LRB): CREATION OF LEFT ARM ARTERIOVENOUS (AV) FISTULA (Left)     Patient location during evaluation: PACU Anesthesia Type: MAC Level of consciousness: awake and alert Pain management: pain level controlled Vital Signs Assessment: post-procedure vital signs reviewed and stable Respiratory status: spontaneous breathing, nonlabored ventilation and respiratory function stable Cardiovascular status: stable and blood pressure returned to baseline Anesthetic complications: no    Last Vitals:  Vitals:   09/22/16 1311 09/22/16 1320  BP: 120/87 (!) 130/98  Pulse: 85 88  Resp: 12 14  Temp:  (!) 36.4 C  SpO2: 98% 97%    Last Pain:  Vitals:   09/22/16 1320  TempSrc:   PainSc: 0-No pain                 Calen Geister,W. EDMOND

## 2016-09-22 NOTE — Transfer of Care (Signed)
Immediate Anesthesia Transfer of Care Note  Patient: Brendan Campbell  Procedure(s) Performed: Procedure(s): CREATION OF LEFT ARM ARTERIOVENOUS (AV) FISTULA (Left)  Patient Location: PACU  Anesthesia Type:MAC  Level of Consciousness: awake, alert , oriented and patient cooperative  Airway & Oxygen Therapy: Patient Spontanous Breathing  Post-op Assessment: Report given to RN, Post -op Vital signs reviewed and stable and Patient moving all extremities X 4  Post vital signs: Reviewed and stable  Last Vitals:  Vitals:   09/22/16 0920  BP: (!) 162/93  Pulse: (!) 106  Resp: 18  Temp: 36.9 C  SpO2: 100%    Last Pain:  Vitals:   09/22/16 0920  TempSrc: Oral      Patients Stated Pain Goal: 3 (21/62/44 6950)  Complications: No apparent anesthesia complications

## 2016-09-22 NOTE — Anesthesia Preprocedure Evaluation (Addendum)
Anesthesia Evaluation  Patient identified by MRN, date of birth, ID band Patient awake    Reviewed: Allergy & Precautions, H&P , NPO status , Patient's Chart, lab work & pertinent test results  Airway Mallampati: II  TM Distance: >3 FB Neck ROM: Full    Dental no notable dental hx. (+) Edentulous Upper, Partial Lower, Poor Dentition, Dental Advisory Given   Pulmonary neg pulmonary ROS, former smoker,    Pulmonary exam normal breath sounds clear to auscultation       Cardiovascular + Peripheral Vascular Disease and +CHF   Rhythm:Regular Rate:Normal     Neuro/Psych Anxiety Depression negative neurological ROS  negative psych ROS   GI/Hepatic negative GI ROS, Neg liver ROS,   Endo/Other  diabetes, Insulin Dependent  Renal/GU ESRF and DialysisRenal disease  negative genitourinary   Musculoskeletal  (+) Arthritis , Osteoarthritis,    Abdominal   Peds  Hematology negative hematology ROS (+) anemia ,   Anesthesia Other Findings   Reproductive/Obstetrics negative OB ROS                            Anesthesia Physical Anesthesia Plan  ASA: III  Anesthesia Plan: MAC   Post-op Pain Management:    Induction: Intravenous  PONV Risk Score and Plan: 2 and Ondansetron, Propofol infusion and Midazolam  Airway Management Planned: Simple Face Mask  Additional Equipment:   Intra-op Plan:   Post-operative Plan:   Informed Consent: I have reviewed the patients History and Physical, chart, labs and discussed the procedure including the risks, benefits and alternatives for the proposed anesthesia with the patient or authorized representative who has indicated his/her understanding and acceptance.   Dental advisory given  Plan Discussed with: CRNA  Anesthesia Plan Comments:         Anesthesia Quick Evaluation

## 2016-09-22 NOTE — H&P (Signed)
   History and Physical Update  The patient was interviewed and re-examined.  The patient's previous History and Physical has been reviewed and is unchanged from office visit. Will plan left arm avf vs graft depending on vein intraoperatively.  Idania Desouza C. Donzetta Matters, MD [facility] [place] Office: [phone] Pager: [phone]   09/22/2016, 11:06 AM

## 2016-09-22 NOTE — Telephone Encounter (Signed)
-----   Message from Mena Goes, RN sent at 09/22/2016  1:21 PM EDT ----- Regarding: 6-7 weeks w/ duplex   ----- Message ----- From: Ulyses Amor, PA-C Sent: 09/22/2016  12:58 PM To: Vvs Charge Pool  F/U with Dr. Donzetta Matters in 6-7 weeks for fistula duplex s/p left av fistula creation

## 2016-09-22 NOTE — Anesthesia Procedure Notes (Signed)
Procedure Name: MAC Date/Time: 09/22/2016 11:36 AM Performed by: Mervyn Gay Pre-anesthesia Checklist: Patient identified, Patient being monitored, Timeout performed, Emergency Drugs available and Suction available Patient Re-evaluated:Patient Re-evaluated prior to induction Oxygen Delivery Method: Simple face mask Number of attempts: 1 Placement Confirmation: positive ETCO2 Dental Injury: Teeth and Oropharynx as per pre-operative assessment

## 2016-09-22 NOTE — Progress Notes (Signed)
Discharge instructions reviewed on the telephone with friend Pleas Koch- Patient is being transported from hospital via transport service, but Pleas Koch is waiting on patient at home and will be staying with him today and tonight. Discharge instructions and prescriptions sent with patient.

## 2016-09-22 NOTE — Telephone Encounter (Signed)
LM w/ pt for appts 10/5, mailed lttr per pt req

## 2016-09-22 NOTE — Op Note (Signed)
    Patient name: Brendan Campbell MRN: [id number] DOB: 07-15-1970 Sex: male  09/22/2016 Pre-operative Diagnosis: esrd Post-operative diagnosis:  Same Surgeon:  Erlene Quan C. Donzetta Matters, MD Assistants: Silva Bandy, PA  [place], [place] Procedure Performed: Left 1st stage basilic vein transposition av fistula  Indications:  46 year old male on dialysis via a tunneled catheter. He is indicated for permanent access. He has marginal basilic vein on preoperative imaging for left first stage basilic vein transposition versus graft placement.  Findings: Basilic vein was identified on ultrasound in the operating room and noted to be approximately 4 mm in the mid arm but does taper down to a very small at the antecubitum. Above the antecubitum is approximately 3 mm. There is no discernible cephalic vein. The brachial artery is also quite small at the level of the antecubitum. After exposing the basilic vein transecting it was dilated to 3 mm maximally. The brachial artery itself was only approximately 3 mm as well. At completion there was a palpable pulse in the runoff vein as well as signal with continuous diastolic flow. There is also signal at the radial ulnar arteries at the level of the wrist.   Procedure:  The patient was identified in the holding area and taken to the operating room where his placed supine on the operating table Mac anesthesia induced. He was sterilely prepped and draped in the left arm usual fashion given antibiotics and timeout called. We used ultrasound to identify basilic vein as noted above which was very diminutive below the antecubitum. He then made a curvilinear incision above the antecubitum between the palpable brachial artery pulse and the aforementioned basilic vein. We dissected down onto the vein dissected out for a few centimeters and marked it for orientation. I then dissected out the brachial artery was also noted to be diminutive at approximately 3 mm external diameter. This time a  placed a vessel loop around the brachial artery. The silk vein was clamped and transected distally. The spatulated and dilated up to 3 mm maximally. I then clamped the brachial artery distally and possibly opened longitudinally and flushed with heparinized saline in both directions. Second vein was then sewn end-to-side with 6-0 Prolene suture. Prior to completing anastomosis we did allow flushing maneuvers both antegrade and retrograde. After completing we allowed flushing through the vein. There is palpable pulsatility in the vein and there was continuous diastolic flow on Doppler. He also had strong signals at the radial and ulnar arteries at the wrist did augment with compression of the vein. Satisfied we obtained hemostasis and wound closed in 2 layers with Vicryl Monocryl posterior above the level of the skin. Patient tolerated procedure well without immediate, location.  Should this fistula fail he will need consideration of left upper arm axillary loop graft given the diminutive size of the brachial artery.    Marieli Rudy C. Donzetta Matters, MD [facility] [place] Office: [phone] Pager: [phone]

## 2016-09-23 ENCOUNTER — Encounter (HOSPITAL_COMMUNITY): Payer: Self-pay | Admitting: Vascular Surgery

## 2016-10-14 ENCOUNTER — Telehealth: Payer: Self-pay

## 2016-10-14 NOTE — Telephone Encounter (Signed)
Pt called to report a hard knot and purplish color in the bend of R arm at incision site. S/p L AVF 8/16. Stated this has been going on since 1 wk post surgery. Pt had HD today and stated that the did hear a bruit. Denies fever/chills but does report 7/10 pain constantly. S/w Dr. Donzetta Matters in th office and he suggested the pt's appointment and ultrasound be moved up to next week but if pain became unbearable to report to the ER. Called pt back with appt scheduled for 9/14 @ 3:30 and 3:45. Pt had already called to arrange transportation prior to me calling him back. Pt verbalized understanding and agreed with this plan

## 2016-10-18 NOTE — Addendum Note (Signed)
Addended by: Lianne Cure A on: 10/18/2016 12:51 PM   Modules accepted: Orders

## 2016-10-21 ENCOUNTER — Encounter (HOSPITAL_COMMUNITY): Payer: Self-pay

## 2016-10-21 ENCOUNTER — Encounter: Payer: Self-pay | Admitting: Vascular Surgery

## 2016-10-31 ENCOUNTER — Other Ambulatory Visit (HOSPITAL_COMMUNITY): Payer: Self-pay | Admitting: Medical

## 2016-10-31 DIAGNOSIS — Z992 Dependence on renal dialysis: Principal | ICD-10-CM

## 2016-10-31 DIAGNOSIS — N186 End stage renal disease: Secondary | ICD-10-CM

## 2016-11-02 ENCOUNTER — Other Ambulatory Visit: Payer: Self-pay | Admitting: Radiology

## 2016-11-03 ENCOUNTER — Other Ambulatory Visit: Payer: Self-pay | Admitting: Physical Medicine & Rehabilitation

## 2016-11-03 DIAGNOSIS — Z89511 Acquired absence of right leg below knee: Secondary | ICD-10-CM

## 2016-11-03 DIAGNOSIS — E1342 Other specified diabetes mellitus with diabetic polyneuropathy: Secondary | ICD-10-CM

## 2016-11-03 DIAGNOSIS — M5416 Radiculopathy, lumbar region: Secondary | ICD-10-CM

## 2016-11-03 DIAGNOSIS — M47816 Spondylosis without myelopathy or radiculopathy, lumbar region: Secondary | ICD-10-CM

## 2016-11-03 DIAGNOSIS — N184 Chronic kidney disease, stage 4 (severe): Secondary | ICD-10-CM

## 2016-11-04 ENCOUNTER — Ambulatory Visit (HOSPITAL_COMMUNITY)
Admission: RE | Admit: 2016-11-04 | Discharge: 2016-11-04 | Disposition: A | Discharge status: Home / Self Care | Payer: Medicaid Other | Source: Ambulatory Visit | Attending: Medical | Admitting: Medical

## 2016-11-04 ENCOUNTER — Encounter (HOSPITAL_COMMUNITY): Payer: Self-pay

## 2016-11-04 DIAGNOSIS — Z992 Dependence on renal dialysis: Principal | ICD-10-CM

## 2016-11-04 DIAGNOSIS — N186 End stage renal disease: Secondary | ICD-10-CM

## 2016-11-04 NOTE — Progress Notes (Signed)
Patient ID: Brendan Campbell, male   DOB: 1970-10-30, 46 y.o.   MRN: [id number]   Pt was scheduled for HD catheter exchange in IR today Placed In IR 05/16/2016  Using successfully now  Noted dried pus like material at site and redness 1 week ago while in dialysis [facility] negative -- final per [facility] PAC Skin cx + MRSA Has received IV Vancomycin x 3 so far  Discussed with Dr Pilar Jarvis Dialysis catheter working well Skin site is better today Minimal redness + dried yellow material at site but less per Pt.  Afeb; VSS  Discussed with [facility] Will hold on exchange Continue treatment as needed for skin site infection  Call us if need [place] Agreeable to plan

## 2016-11-05 ENCOUNTER — Other Ambulatory Visit: Payer: Self-pay | Admitting: Family Medicine

## 2016-11-05 ENCOUNTER — Other Ambulatory Visit: Payer: Self-pay | Admitting: Physical Medicine & Rehabilitation

## 2016-11-05 DIAGNOSIS — N184 Chronic kidney disease, stage 4 (severe): Secondary | ICD-10-CM

## 2016-11-05 DIAGNOSIS — Z89511 Acquired absence of right leg below knee: Secondary | ICD-10-CM

## 2016-11-05 DIAGNOSIS — E1342 Other specified diabetes mellitus with diabetic polyneuropathy: Secondary | ICD-10-CM

## 2016-11-05 DIAGNOSIS — M47816 Spondylosis without myelopathy or radiculopathy, lumbar region: Secondary | ICD-10-CM

## 2016-11-05 DIAGNOSIS — M5416 Radiculopathy, lumbar region: Secondary | ICD-10-CM

## 2016-11-11 ENCOUNTER — Ambulatory Visit (INDEPENDENT_AMBULATORY_CARE_PROVIDER_SITE_OTHER): Payer: Self-pay | Admitting: Vascular Surgery

## 2016-11-11 ENCOUNTER — Encounter: Payer: Self-pay | Admitting: Vascular Surgery

## 2016-11-11 ENCOUNTER — Encounter (HOSPITAL_COMMUNITY): Payer: Medicaid Other

## 2016-11-11 ENCOUNTER — Encounter: Payer: Medicaid Other | Admitting: Vascular Surgery

## 2016-11-11 ENCOUNTER — Other Ambulatory Visit: Payer: Self-pay | Admitting: *Deleted

## 2016-11-11 ENCOUNTER — Encounter: Payer: Self-pay | Admitting: *Deleted

## 2016-11-11 ENCOUNTER — Ambulatory Visit (HOSPITAL_COMMUNITY)
Admission: RE | Admit: 2016-11-11 | Discharge: 2016-11-11 | Disposition: A | Discharge status: Home / Self Care | Payer: Medicaid Other | Source: Ambulatory Visit | Attending: Vascular Surgery | Admitting: Vascular Surgery

## 2016-11-11 VITALS — BP 127/79 | HR 76 | Temp 97.5°F | Resp 16 | Ht 72.0 in | Wt 227.0 lb

## 2016-11-11 DIAGNOSIS — Z992 Dependence on renal dialysis: Secondary | ICD-10-CM | POA: Diagnosis present

## 2016-11-11 DIAGNOSIS — Z48812 Encounter for surgical aftercare following surgery on the circulatory system: Secondary | ICD-10-CM | POA: Insufficient documentation

## 2016-11-11 DIAGNOSIS — N186 End stage renal disease: Secondary | ICD-10-CM

## 2016-11-11 NOTE — Progress Notes (Signed)
Patient ID: Brendan Campbell, male   DOB: May 12, 1970, 46 y.o.   MRN: [id number]  Reason for Consult: Routine Post Op   Referred by Timmothy Euler, MD  Subjective:     HPI:  Brendan Campbell is a 46 y.o. male prevents for follow-up from previous left stage basilic vein transposition fistula. He does have some pain in his left arm around the elbow. Prior to surgery he had marginal vein for basilic vein transposition we consider graft. He is on dialysis via right IJ catheter on Monday Wednesdays and Fridays. He does not take any blood thinners is not having any other issues today.  Past Medical History:  Diagnosis Date  . Anemia   . Anxiety   . Blood transfusion without reported diagnosis   . CHF (congestive heart failure) ([place])   . Chronic kidney disease   . Depression   . Diabetes mellitus without complication ([place])   . End stage renal disease ([place])    M/W/F [facility]  . Hyperlipidemia   . Neuropathy   . Peripheral vascular disease ([place])   . PTSD (post-traumatic stress disorder)    Family History  Problem Relation Age of Onset  . Cancer Mother        lung  . Diabetes Mother   . Heart attack Father   . Diabetes Father   . Diabetes Sister    Past Surgical History:  Procedure Laterality Date  . AV FISTULA PLACEMENT Left 09/22/2016   Procedure: CREATION OF LEFT ARM ARTERIOVENOUS (AV) FISTULA;  Surgeon: Waynetta Sandy, MD;  Location: [facility];  Service: Vascular;  Laterality: Left;  . BELOW KNEE LEG AMPUTATION Right   . CHOLECYSTECTOMY    . FOOT SURGERY    . IR FLUORO GUIDE CV LINE RIGHT  05/16/2016  . IR REMOVAL TUN CV CATH W/O FL  05/16/2016  . IR US GUIDE VASC ACCESS RIGHT  05/16/2016    Short Social History:  Social History  Substance Use Topics  . Smoking status: Former Smoker    Quit date: 09/03/2006  . Smokeless tobacco: Never Used  . Alcohol use No    Allergies  Allergen Reactions  . Tape Other (See Comments)    Pulls skin off    Current Outpatient  Prescriptions  Medication Sig Dispense Refill  . albuterol (PROVENTIL HFA;VENTOLIN HFA) 108 (90 Base) MCG/ACT inhaler Inhale 1-2 puffs into the lungs every 6 (six) hours as needed for wheezing or shortness of breath.    [name] aspirin EC 81 MG tablet Take 81 mg by mouth daily.    [name] atorvastatin (LIPITOR) 10 MG tablet Take 1 tablet (10 mg total) by mouth every evening. (Patient taking differently: Take 10 mg by mouth daily. ) 90 tablet 3  . baclofen (LIORESAL) 10 MG tablet Take 1 tablet (10 mg total) by mouth every 8 (eight) hours as needed for muscle spasms. 60 tablet 3  . blood glucose meter kit and supplies KIT Dispense based on patient and insurance preference. Use up to four times daily as directed. (FOR ICD-9 250.00, 250.01). 1 each 0  . calcium carbonate (TUMS - DOSED IN MG ELEMENTAL CALCIUM) 500 MG chewable tablet Chew 3 tablets by mouth daily.    . Insulin Glargine (LANTUS SOLOSTAR) 100 UNIT/ML Solostar Pen Inject 10-15 Units into the skin daily at 10 pm. (Patient taking differently: Inject 10 Units into the skin daily at 10 pm. ) 5 pen PRN  . oxyCODONE-acetaminophen (PERCOCET/ROXICET) 5-325 MG tablet Take  1 tablet by mouth every 6 (six) hours as needed. 6 tablet 0  . polyethylene glycol powder (GLYCOLAX/MIRALAX) powder 1/2 to 2 capfuls daily for constipation. (Patient taking differently: Take 8.5-34 g by mouth daily as needed (for constipation.). ) 3350 g 1  . SURE COMFORT PEN NEEDLES 31G X 5 MM MISC USE AS DIRECTED DAILY. 100 each 2   No current facility-administered medications for this visit.     Review of Systems  Constitutional:  Constitutional negative. Cardiovascular: Cardiovascular negative.  GI: Gastrointestinal negative.  Musculoskeletal:       Arm pain near incision Neurological: Positive for numbness.        Objective:  Objective   Vitals:   11/11/16 1215  BP: 127/79  Pulse: 76  Resp: 16  Temp: (!) 97.5 F (36.4 C)  SpO2: 98%  Weight: 227 lb (103 kg)  Height: 6'  (1.829 m)   Body mass index is 30.79 kg/m.  Physical Exam  Constitutional: He appears well-developed.  HENT:  Head: Normocephalic.  Eyes: Pupils are equal, round, and reactive to light.  Neck: Normal range of motion.  Cardiovascular:  Left radial multiphasic signal  Pulmonary/Chest: Effort normal.  Abdominal: Soft.  Musculoskeletal:  Strong basilic vein signal in upper arm, cannot palpate  Neurological: He is alert.  Skin: Skin is warm and dry.  Psychiatric: He has a normal mood and affect. His behavior is normal. Judgment and thought content normal.    Data: Right ventricular lead interpreted his left arm duplex which demonstrated minimal flow volume diameter up to nearly 0.6 cm down to 0.19 cm in the upper arm      Assessment/Plan:     46 year old male status post left first stage basilic vein transposition fistula. He did not have any basilic vein noted on preoperative vein mapping but It was discovered on surgical exploration and was easily dilated to 3 mm. Now has failed to mature. We will proceed with fistulogram on a nondialysis day of the near future the expected results being balloon assisted maturation versus abandoning the fistula and proceeded with left upper arm AV graft. He demonstrated good understanding we will get him scheduled today.     Waynetta Sandy MD [facility]

## 2016-11-22 ENCOUNTER — Other Ambulatory Visit: Payer: Self-pay | Admitting: *Deleted

## 2016-11-22 ENCOUNTER — Encounter: Payer: Self-pay | Admitting: *Deleted

## 2016-11-22 ENCOUNTER — Ambulatory Visit (HOSPITAL_COMMUNITY): Admission: RE | Admit: 2016-11-22 | Payer: Medicaid Other | Source: Ambulatory Visit | Admitting: Surgery

## 2016-11-22 ENCOUNTER — Encounter (HOSPITAL_COMMUNITY): Admission: RE | Payer: Self-pay | Source: Ambulatory Visit

## 2016-11-22 ENCOUNTER — Telehealth: Payer: Self-pay | Admitting: *Deleted

## 2016-11-22 SURGERY — A/V FISTULAGRAM
Anesthesia: LOCAL

## 2016-11-22 NOTE — Telephone Encounter (Signed)
Call to [facility] at [facility] to resched. Patient for Fistulogram for 11/29/2016 to be at hospital at 8:30 am.  Letter of instruction faxed to [facility] who states they will set up transportation and go over instructions with patient.

## 2016-11-29 ENCOUNTER — Ambulatory Visit (HOSPITAL_COMMUNITY): Admission: RE | Admit: 2016-11-29 | Payer: Medicaid Other | Source: Ambulatory Visit | Admitting: Surgery

## 2016-11-29 ENCOUNTER — Encounter (HOSPITAL_COMMUNITY): Admission: RE | Payer: Self-pay | Source: Ambulatory Visit

## 2016-11-29 SURGERY — A/V FISTULAGRAM
Anesthesia: LOCAL

## 2017-02-01 NOTE — Progress Notes (Deleted)
Subjective: CC: Hospital follow up PCP: Timmothy Euler, MD Brendan Campbell is a 46 y.o. male presenting to clinic today for:  1. Hospital follow up ***   ROS: Per HPI  Allergies  Allergen Reactions  . Tape Other (See Comments)    Pulls skin off   Past Medical History:  Diagnosis Date  . Anemia   . Anxiety   . Blood transfusion without reported diagnosis   . CHF (congestive heart failure) ([place])   . Chronic kidney disease   . Depression   . Diabetes mellitus without complication ([name])   . End stage renal disease ([place])    M/W/F [facility]  . Hyperlipidemia   . Neuropathy   . Peripheral vascular disease ([place])   . PTSD (post-traumatic stress disorder)     Current Outpatient Medications:  .  albuterol (PROVENTIL HFA;VENTOLIN HFA) 108 (90 Base) MCG/ACT inhaler, Inhale 1-2 puffs into the lungs every 6 (six) hours as needed for wheezing or shortness of breath., Disp: , Rfl:  .  aspirin EC 81 MG tablet, Take 81 mg by mouth daily., Disp: , Rfl:  .  atorvastatin (LIPITOR) 10 MG tablet, Take 1 tablet (10 mg total) by mouth every evening. (Patient taking differently: Take 10 mg by mouth daily. ), Disp: 90 tablet, Rfl: 3 .  baclofen (LIORESAL) 10 MG tablet, Take 1 tablet (10 mg total) by mouth every 8 (eight) hours as needed for muscle spasms., Disp: 60 tablet, Rfl: 3 .  blood glucose meter kit and supplies KIT, Dispense based on patient and insurance preference. Use up to four times daily as directed. (FOR ICD-9 250.00, 250.01)., Disp: 1 each, Rfl: 0 .  calcium carbonate (TUMS - DOSED IN MG ELEMENTAL CALCIUM) 500 MG chewable tablet, Chew 3 tablets by mouth daily., Disp: , Rfl:  .  Insulin Glargine (LANTUS SOLOSTAR) 100 UNIT/ML Solostar Pen, Inject 10-15 Units into the skin daily at 10 pm. (Patient taking differently: Inject 10 Units into the skin daily at 10 pm. ), Disp: 5 pen, Rfl: PRN .  oxyCODONE-acetaminophen (PERCOCET/ROXICET) 5-325 MG tablet, Take 1 tablet by mouth every  6 (six) hours as needed., Disp: 6 tablet, Rfl: 0 .  polyethylene glycol powder (GLYCOLAX/MIRALAX) powder, 1/2 to 2 capfuls daily for constipation. (Patient taking differently: Take 8.5-34 g by mouth daily as needed (for constipation.). ), Disp: 3350 g, Rfl: 1 .  SURE COMFORT PEN NEEDLES 31G X 5 MM MISC, USE AS DIRECTED DAILY., Disp: 100 each, Rfl: 2 Social History   Socioeconomic History  . Marital status: Single    Spouse name: Not on file  . Number of children: Not on file  . Years of education: Not on file  . Highest education level: Not on file  Social Needs  . Financial resource strain: Not on file  . Food insecurity - worry: Not on file  . Food insecurity - inability: Not on file  . Transportation needs - medical: Not on file  . Transportation needs - non-medical: Not on file  Occupational History  . Not on file  Tobacco Use  . Smoking status: Former Smoker    Last attempt to quit: 09/03/2006    Years since quitting: 10.4  . Smokeless tobacco: Never Used  Substance and Sexual Activity  . Alcohol use: No  . Drug use: No  . Sexual activity: Not on file  Other Topics Concern  . Not on file  Social History Narrative  . Not on file   Family  History  Problem Relation Age of Onset  . Cancer Mother        lung  . Diabetes Mother   . Heart attack Father   . Diabetes Father   . Diabetes Sister     Objective: Office vital signs reviewed. There were no vitals taken for this visit.  Physical Examination:  General: Awake, alert, *** nourished, No acute distress HEENT: Normal    Neck: No masses palpated. No lymphadenopathy    Ears: Tympanic membranes intact, normal light reflex, no erythema, no bulging    Eyes: PERRLA, extraocular membranes intact, sclera ***    Nose: nasal turbinates moist, *** nasal discharge    Throat: moist mucus membranes, no erythema, *** tonsillar exudate.  Airway is patent Cardio: regular rate and rhythm, S1S2 heard, no murmurs appreciated Pulm:  clear to auscultation bilaterally, no wheezes, rhonchi or rales; normal work of breathing on room air GI: soft, non-tender, non-distended, bowel sounds present x4, no hepatomegaly, no splenomegaly, no masses GU: external vaginal tissue ***, cervix ***, *** punctate lesions on cervix appreciated, *** discharge from cervical os, *** bleeding, *** cervical motion tenderness, *** abdominal/ adnexal masses Extremities: warm, well perfused, No edema, cyanosis or clubbing; +*** pulses bilaterally MSK: *** gait and *** station Skin: dry; intact; no rashes or lesions Neuro: *** Strength and light touch sensation grossly intact, *** DTRs ***/4  Assessment/ Plan: 46 y.o. male   ***  No orders of the defined types were placed in this encounter.  No orders of the defined types were placed in this encounter.    Janora Norlander, DO [place] [phone]

## 2017-02-03 ENCOUNTER — Ambulatory Visit: Payer: Self-pay | Admitting: Family Medicine

## 2017-02-09 ENCOUNTER — Encounter: Payer: Self-pay | Admitting: Family Medicine

## 2017-03-09 ENCOUNTER — Other Ambulatory Visit: Payer: Self-pay | Admitting: Family Medicine

## 2017-07-25 ENCOUNTER — Other Ambulatory Visit: Payer: Self-pay | Admitting: *Deleted

## 2017-07-25 ENCOUNTER — Encounter: Payer: Self-pay | Admitting: Vascular Surgery

## 2017-07-25 ENCOUNTER — Encounter: Payer: Self-pay | Admitting: *Deleted

## 2017-07-25 ENCOUNTER — Other Ambulatory Visit: Payer: Self-pay

## 2017-07-25 ENCOUNTER — Ambulatory Visit (INDEPENDENT_AMBULATORY_CARE_PROVIDER_SITE_OTHER): Payer: Medicaid Other | Admitting: Vascular Surgery

## 2017-07-25 VITALS — BP 132/85 | HR 75 | Temp 97.7°F | Resp 16 | Ht 72.0 in | Wt 230.0 lb

## 2017-07-25 DIAGNOSIS — N186 End stage renal disease: Secondary | ICD-10-CM | POA: Diagnosis not present

## 2017-07-25 DIAGNOSIS — Z992 Dependence on renal dialysis: Secondary | ICD-10-CM | POA: Diagnosis not present

## 2017-07-25 NOTE — Progress Notes (Signed)
[facility] [place]  Patient name: Brendan Campbell MRN: [id number] DOB: [birth year] Sex: male  REASON FOR VISIT: Follow-up for stage basilic vein transposition fistula  HPI: Brendan Campbell is a 47 y.o. male here today for follow-up.  Had first stage basilic vein fistula by Dr. Donzetta Matters on 09/22/2016.  He had poor maturation and at last visit had discussion regarding potential fistulogram.  This was in October 2018.  He had failed to follow-up.  He is now here today for further discussion.  He is being dialyzed via a hemodialysis catheter.  Past Medical History:  Diagnosis Date  . Anemia   . Anxiety   . Blood transfusion without reported diagnosis   . CHF (congestive heart failure) ([place])   . Chronic kidney disease   . Depression   . Diabetes mellitus without complication ([place])   . End stage renal disease ([place])    M/W/F [facility]  . Hyperlipidemia   . Neuropathy   . Peripheral vascular disease ([place])   . PTSD (post-traumatic stress disorder)     Family History  Problem Relation Age of Onset  . Cancer Mother        lung  . Diabetes Mother   . Heart attack Father   . Diabetes Father   . Diabetes Sister     SOCIAL HISTORY: Social History   Tobacco Use  . Smoking status: Former Smoker    Last attempt to quit: 09/03/2006    Years since quitting: 10.8  . Smokeless tobacco: Never Used  Substance Use Topics  . Alcohol use: No    Allergies  Allergen Reactions  . Tape Other (See Comments)    Pulls skin off    Current Outpatient Medications  Medication Sig Dispense Refill  . albuterol (PROVENTIL HFA;VENTOLIN HFA) 108 (90 Base) MCG/ACT inhaler Inhale 1-2 puffs into the lungs every 6 (six) hours as needed for wheezing or shortness of breath.    [name] aspirin EC 81 MG tablet Take 81 mg by mouth daily.    [name] atorvastatin (LIPITOR) 10 MG tablet Take 1 tablet (10 mg total) by mouth every evening. (Patient taking differently: Take  10 mg by mouth daily. ) 90 tablet 3  . baclofen (LIORESAL) 10 MG tablet Take 1 tablet (10 mg total) by mouth every 8 (eight) hours as needed for muscle spasms. 60 tablet 3  . blood glucose meter kit and supplies KIT Dispense based on patient and insurance preference. Use up to four times daily as directed. (FOR ICD-9 250.00, 250.01). 1 each 0  . calcium carbonate (TUMS - DOSED IN MG ELEMENTAL CALCIUM) 500 MG chewable tablet Chew 3 tablets by mouth daily.    . Insulin Glargine (LANTUS SOLOSTAR) 100 UNIT/ML Solostar Pen Inject 10-15 Units into the skin daily at 10 pm. (Patient taking differently: Inject 10 Units into the skin daily at 10 pm. ) 5 pen PRN  . oxyCODONE-acetaminophen (PERCOCET/ROXICET) 5-325 MG tablet Take 1 tablet by mouth every 6 (six) hours as needed. 6 tablet 0  . polyethylene glycol powder (GLYCOLAX/MIRALAX) powder 1/2 to 2 capfuls daily for constipation. (Patient taking differently: Take 8.5-34 g by mouth daily as needed (for constipation.). ) 3350 g 1  . SURE COMFORT PEN NEEDLES 31G X 5 MM MISC USE AS DIRECTED DAILY. 100 each 2   No current facility-administered medications for this visit.     REVIEW OF SYSTEMS:  _0  denotes positive finding, _1  denotes negative finding Cardiac  Comments:  Chest pain or chest pressure:    Shortness of breath upon exertion:    Short of breath when lying flat:    Irregular heart rhythm:        Vascular    Pain in calf, thigh, or hip brought on by ambulation:    Pain in feet at night that wakes you up from your sleep:     Blood clot in your veins:    Leg swelling:           PHYSICAL EXAM: Vitals:   07/25/17 1537  BP: 132/85  Pulse: 75  Resp: 16  Temp: 97.7 F (36.5 C)  TempSrc: Oral  SpO2: 91%  Weight: 230 lb (104.3 kg)  Height: 6' (1.829 m)    GENERAL: The patient is a well-nourished male, in no acute distress. The vital signs are documented above. CARDIOVASCULAR: Good thrill and left upper arm fistula. PULMONARY: There is  good air exchange  MUSCULOSKELETAL: There are no major deformities or cyanosis. NEUROLOGIC: No focal weakness or paresthesias are detected. SKIN: There are no ulcers or rashes noted. PSYCHIATRIC: The patient has a normal affect.  DATA:  I imaged his fistula with SonoSite ultrasound.  He does have a small basilic vein just above the anastomosis but has Philipp Callegari branching just above this in the distal forearm and a very nice caliber basilic vein throughout the upper arm towards the axilla.  MEDICAL ISSUES: Patient is now 10 months out from first stage basilic vein fistula.  He does have relatively small size within the first several centimeters of the anastomosis but a nice vein above this.  I have recommended proceeding with a second stage transposition of his fistula.  He has hemodialysis on Monday Wednesday and Friday and so this will be coordinated with him as an outpatient on a Tuesday or Thursday    Rosetta Posner, MD [facility] Tel [phone] Pager [phone]

## 2017-07-25 NOTE — Progress Notes (Signed)
Pre op instruction letter faxed to [name] to review with patient and to arrange transportation for this surgery date.

## 2017-07-26 ENCOUNTER — Telehealth: Payer: Self-pay | Admitting: *Deleted

## 2017-07-26 NOTE — Telephone Encounter (Signed)
Per JOY at [facility]. Received pre-op instructions for 08/17/2017 surgery. Will review with patient and arrange transportation.

## 2017-08-15 NOTE — Progress Notes (Signed)
I have been unable to Mr Scheffler on the phone.  I spoke to [place] at [facility] in [place] and asked what time he dialysis in am, I was informed that he should arrive at kidney center around 10:30 for treatment at 1100. Margreta Journey said that patient can use a center portable cell phone. Christina checked last A1C , it was 4.0 on 07/26/17.  Margreta Journey also reported that patient has been doing well with dialysis treatments.  Margreta Journey and Joy checked patient records and checked for an EKG, none was found.  I requested records from patient's nephrologist, Dr Berton Bon.

## 2017-08-16 NOTE — Progress Notes (Signed)
Only pre-op instructions were provided to pt. Pt stated that his phone " was about to die." Pt denies any acute cardiopulmonary issues and was unable to state name of cardiologist at the time. Pt made aware to take only 5 units of Lantus tonight. Pt made aware to check BG every 2 hours prior to arrival to hospital on DOS. Pt made aware to treat a BG < 70 with 4 ounces of apple  juice, wait 15 minutes after intervention to recheck BG, if BG remains < 70, call Short Stay unit to speak with a nurse. Unable to get the name and number of person driving pt home and monitoring pt post op; pt phone needs charging (call disconnected) and no answer on return calls.

## 2017-08-17 ENCOUNTER — Ambulatory Visit (HOSPITAL_COMMUNITY)
Admission: RE | Admit: 2017-08-17 | Discharge: 2017-08-17 | Disposition: A | Discharge status: Home / Self Care | Payer: Medicaid Other | Source: Ambulatory Visit | Attending: Vascular Surgery | Admitting: Vascular Surgery

## 2017-08-17 ENCOUNTER — Encounter (HOSPITAL_COMMUNITY)
Admission: RE | Disposition: A | Discharge status: Home / Self Care | Payer: Self-pay | Source: Ambulatory Visit | Attending: Vascular Surgery

## 2017-08-17 ENCOUNTER — Ambulatory Visit (HOSPITAL_COMMUNITY): Payer: Medicaid Other | Admitting: Anesthesiology

## 2017-08-17 ENCOUNTER — Telehealth: Payer: Self-pay | Admitting: Vascular Surgery

## 2017-08-17 ENCOUNTER — Encounter (HOSPITAL_COMMUNITY): Payer: Self-pay

## 2017-08-17 ENCOUNTER — Other Ambulatory Visit: Payer: Self-pay

## 2017-08-17 DIAGNOSIS — E1140 Type 2 diabetes mellitus with diabetic neuropathy, unspecified: Secondary | ICD-10-CM | POA: Diagnosis not present

## 2017-08-17 DIAGNOSIS — Z91048 Other nonmedicinal substance allergy status: Secondary | ICD-10-CM | POA: Insufficient documentation

## 2017-08-17 DIAGNOSIS — Z801 Family history of malignant neoplasm of trachea, bronchus and lung: Secondary | ICD-10-CM | POA: Diagnosis not present

## 2017-08-17 DIAGNOSIS — Z833 Family history of diabetes mellitus: Secondary | ICD-10-CM | POA: Insufficient documentation

## 2017-08-17 DIAGNOSIS — F329 Major depressive disorder, single episode, unspecified: Secondary | ICD-10-CM | POA: Insufficient documentation

## 2017-08-17 DIAGNOSIS — Z87891 Personal history of nicotine dependence: Secondary | ICD-10-CM | POA: Insufficient documentation

## 2017-08-17 DIAGNOSIS — Z79899 Other long term (current) drug therapy: Secondary | ICD-10-CM | POA: Diagnosis not present

## 2017-08-17 DIAGNOSIS — Z794 Long term (current) use of insulin: Secondary | ICD-10-CM | POA: Insufficient documentation

## 2017-08-17 DIAGNOSIS — Z8249 Family history of ischemic heart disease and other diseases of the circulatory system: Secondary | ICD-10-CM | POA: Diagnosis not present

## 2017-08-17 DIAGNOSIS — Z992 Dependence on renal dialysis: Secondary | ICD-10-CM | POA: Diagnosis not present

## 2017-08-17 DIAGNOSIS — M1990 Unspecified osteoarthritis, unspecified site: Secondary | ICD-10-CM | POA: Diagnosis not present

## 2017-08-17 DIAGNOSIS — F4310 Post-traumatic stress disorder, unspecified: Secondary | ICD-10-CM | POA: Diagnosis not present

## 2017-08-17 DIAGNOSIS — Z7982 Long term (current) use of aspirin: Secondary | ICD-10-CM | POA: Insufficient documentation

## 2017-08-17 DIAGNOSIS — D631 Anemia in chronic kidney disease: Secondary | ICD-10-CM | POA: Diagnosis not present

## 2017-08-17 DIAGNOSIS — I509 Heart failure, unspecified: Secondary | ICD-10-CM | POA: Insufficient documentation

## 2017-08-17 DIAGNOSIS — E1151 Type 2 diabetes mellitus with diabetic peripheral angiopathy without gangrene: Secondary | ICD-10-CM | POA: Insufficient documentation

## 2017-08-17 DIAGNOSIS — E785 Hyperlipidemia, unspecified: Secondary | ICD-10-CM | POA: Diagnosis not present

## 2017-08-17 DIAGNOSIS — E1122 Type 2 diabetes mellitus with diabetic chronic kidney disease: Secondary | ICD-10-CM | POA: Diagnosis not present

## 2017-08-17 DIAGNOSIS — N185 Chronic kidney disease, stage 5: Secondary | ICD-10-CM

## 2017-08-17 DIAGNOSIS — Z7951 Long term (current) use of inhaled steroids: Secondary | ICD-10-CM | POA: Diagnosis not present

## 2017-08-17 DIAGNOSIS — N186 End stage renal disease: Secondary | ICD-10-CM | POA: Insufficient documentation

## 2017-08-17 HISTORY — PX: BASCILIC VEIN TRANSPOSITION: SHX5742

## 2017-08-17 LAB — POCT I-STAT 4, (NA,K, GLUC, HGB,HCT)
GLUCOSE: 85 mg/dL (ref 70–99)
HEMATOCRIT: 32 % — AB (ref 39.0–52.0)
HEMOGLOBIN: 10.9 g/dL — AB (ref 13.0–17.0)
POTASSIUM: 4.7 mmol/L (ref 3.5–5.1)
SODIUM: 139 mmol/L (ref 135–145)

## 2017-08-17 LAB — GLUCOSE, CAPILLARY: GLUCOSE-CAPILLARY: 71 mg/dL (ref 70–99)

## 2017-08-17 SURGERY — TRANSPOSITION, VEIN, BASILIC
Anesthesia: General | Site: Arm Upper | Laterality: Left

## 2017-08-17 MED ORDER — SODIUM CHLORIDE 0.9 % IV SOLN
INTRAVENOUS | Status: DC | PRN
  Administered 2017-08-17: 500 mL

## 2017-08-17 MED ORDER — LIDOCAINE 2% (20 MG/ML) 5 ML SYRINGE
INTRAMUSCULAR | Status: DC | PRN
  Administered 2017-08-17: 60 mg via INTRAVENOUS

## 2017-08-17 MED ORDER — OXYCODONE-ACETAMINOPHEN 5-325 MG PO TABS
ORAL_TABLET | ORAL | Status: AC
  Filled 2017-08-17: qty 1

## 2017-08-17 MED ORDER — PHENYLEPHRINE 40 MCG/ML (10ML) SYRINGE FOR IV PUSH (FOR BLOOD PRESSURE SUPPORT)
PREFILLED_SYRINGE | INTRAVENOUS | Status: AC
  Filled 2017-08-17: qty 10

## 2017-08-17 MED ORDER — ALBUTEROL SULFATE (2.5 MG/3ML) 0.083% IN NEBU
INHALATION_SOLUTION | RESPIRATORY_TRACT | Status: AC
  Filled 2017-08-17: qty 3

## 2017-08-17 MED ORDER — MIDAZOLAM HCL 2 MG/2ML IJ SOLN
INTRAMUSCULAR | Status: AC
  Filled 2017-08-17: qty 2

## 2017-08-17 MED ORDER — PHENYLEPHRINE 40 MCG/ML (10ML) SYRINGE FOR IV PUSH (FOR BLOOD PRESSURE SUPPORT)
PREFILLED_SYRINGE | INTRAVENOUS | Status: DC | PRN
  Administered 2017-08-17: 80 ug via INTRAVENOUS
  Administered 2017-08-17 (×2): 40 ug via INTRAVENOUS

## 2017-08-17 MED ORDER — PROPOFOL 10 MG/ML IV BOLUS
INTRAVENOUS | Status: AC
  Filled 2017-08-17: qty 20

## 2017-08-17 MED ORDER — SODIUM CHLORIDE 0.9 % IV SOLN
INTRAVENOUS | Status: DC
  Administered 2017-08-17: 10:00:00 via INTRAVENOUS

## 2017-08-17 MED ORDER — LIDOCAINE HCL (PF) 1 % IJ SOLN
INTRAMUSCULAR | Status: AC
  Filled 2017-08-17: qty 30

## 2017-08-17 MED ORDER — ONDANSETRON HCL 4 MG/2ML IJ SOLN
INTRAMUSCULAR | Status: DC | PRN
  Administered 2017-08-17: 4 mg via INTRAVENOUS

## 2017-08-17 MED ORDER — ALBUTEROL SULFATE (2.5 MG/3ML) 0.083% IN NEBU
2.5000 mg | INHALATION_SOLUTION | Freq: Once | RESPIRATORY_TRACT | Status: AC
  Administered 2017-08-17: 2.5 mg via RESPIRATORY_TRACT

## 2017-08-17 MED ORDER — DEXAMETHASONE SODIUM PHOSPHATE 10 MG/ML IJ SOLN
INTRAMUSCULAR | Status: DC | PRN
  Administered 2017-08-17: 5 mg via INTRAVENOUS

## 2017-08-17 MED ORDER — OXYCODONE-ACETAMINOPHEN 5-325 MG PO TABS: 1.0000 | ORAL_TABLET | Freq: Four times a day (QID) | ORAL | 0 refills | Status: DC | PRN

## 2017-08-17 MED ORDER — FENTANYL CITRATE (PF) 250 MCG/5ML IJ SOLN
INTRAMUSCULAR | Status: DC | PRN
  Administered 2017-08-17: 50 ug via INTRAVENOUS

## 2017-08-17 MED ORDER — MIDAZOLAM HCL 5 MG/5ML IJ SOLN
INTRAMUSCULAR | Status: DC | PRN
  Administered 2017-08-17: 1 mg via INTRAVENOUS

## 2017-08-17 MED ORDER — ONDANSETRON HCL 4 MG/2ML IJ SOLN
INTRAMUSCULAR | Status: AC
  Filled 2017-08-17: qty 2

## 2017-08-17 MED ORDER — 0.9 % SODIUM CHLORIDE (POUR BTL) OPTIME
TOPICAL | Status: DC | PRN
  Administered 2017-08-17: 1000 mL

## 2017-08-17 MED ORDER — FENTANYL CITRATE (PF) 100 MCG/2ML IJ SOLN
INTRAMUSCULAR | Status: AC
  Filled 2017-08-17: qty 2

## 2017-08-17 MED ORDER — FENTANYL CITRATE (PF) 250 MCG/5ML IJ SOLN
INTRAMUSCULAR | Status: AC
  Filled 2017-08-17: qty 5

## 2017-08-17 MED ORDER — PROPOFOL 10 MG/ML IV BOLUS
INTRAVENOUS | Status: DC | PRN
  Administered 2017-08-17: 200 mg via INTRAVENOUS

## 2017-08-17 MED ORDER — CHLORHEXIDINE GLUCONATE 4 % EX LIQD: 60.0000 mL | Freq: Once | CUTANEOUS | Status: DC

## 2017-08-17 MED ORDER — SODIUM CHLORIDE 0.9 % IV SOLN
INTRAVENOUS | Status: AC
  Filled 2017-08-17: qty 1.2

## 2017-08-17 MED ORDER — DEXAMETHASONE SODIUM PHOSPHATE 10 MG/ML IJ SOLN
INTRAMUSCULAR | Status: AC
  Filled 2017-08-17: qty 1

## 2017-08-17 MED ORDER — OXYCODONE-ACETAMINOPHEN 5-325 MG PO TABS
1.0000 | ORAL_TABLET | Freq: Once | ORAL | Status: AC
  Administered 2017-08-17: 1 via ORAL

## 2017-08-17 MED ORDER — CEFAZOLIN SODIUM-DEXTROSE 2-4 GM/100ML-% IV SOLN
2.0000 g | INTRAVENOUS | Status: AC
  Administered 2017-08-17: 2 g via INTRAVENOUS
  Filled 2017-08-17: qty 100

## 2017-08-17 MED ORDER — FENTANYL CITRATE (PF) 100 MCG/2ML IJ SOLN
25.0000 ug | INTRAMUSCULAR | Status: DC | PRN
  Administered 2017-08-17: 50 ug via INTRAVENOUS

## 2017-08-17 MED ORDER — ONDANSETRON HCL 4 MG/2ML IJ SOLN: 4.0000 mg | Freq: Once | INTRAMUSCULAR | Status: DC | PRN

## 2017-08-17 SURGICAL SUPPLY — 29 items
ARMBAND PINK RESTRICT EXTREMIT (MISCELLANEOUS) ×2 IMPLANT
CANISTER SUCT 3000ML PPV (MISCELLANEOUS) ×2 IMPLANT
CLIP VESOCCLUDE MED 6/CT (CLIP) ×4 IMPLANT
CLIP VESOCCLUDE SM WIDE 6/CT (CLIP) ×4 IMPLANT
COVER PROBE W GEL 5X96 (DRAPES) ×2 IMPLANT
DERMABOND ADHESIVE PROPEN (GAUZE/BANDAGES/DRESSINGS) ×1
DERMABOND ADVANCED (GAUZE/BANDAGES/DRESSINGS) ×1
DERMABOND ADVANCED .7 DNX12 (GAUZE/BANDAGES/DRESSINGS) ×1 IMPLANT
DERMABOND ADVANCED .7 DNX6 (GAUZE/BANDAGES/DRESSINGS) ×1 IMPLANT
ELECT REM PT RETURN 9FT ADLT (ELECTROSURGICAL) ×2
ELECTRODE REM PT RTRN 9FT ADLT (ELECTROSURGICAL) ×1 IMPLANT
GLOVE BIO SURGEON STRL SZ7.5 (GLOVE) ×2 IMPLANT
GOWN STRL REUS W/ TWL LRG LVL3 (GOWN DISPOSABLE) ×2 IMPLANT
GOWN STRL REUS W/ TWL XL LVL3 (GOWN DISPOSABLE) ×1 IMPLANT
GOWN STRL REUS W/TWL LRG LVL3 (GOWN DISPOSABLE) ×2
GOWN STRL REUS W/TWL XL LVL3 (GOWN DISPOSABLE) ×1
KIT BASIN OR (CUSTOM PROCEDURE TRAY) ×2 IMPLANT
KIT TURNOVER KIT B (KITS) ×2 IMPLANT
NS IRRIG 1000ML POUR BTL (IV SOLUTION) ×2 IMPLANT
PACK CV ACCESS (CUSTOM PROCEDURE TRAY) ×2 IMPLANT
PAD ARMBOARD 7.5X6 YLW CONV (MISCELLANEOUS) ×4 IMPLANT
SUT MNCRL AB 4-0 PS2 18 (SUTURE) ×2 IMPLANT
SUT PROLENE 6 0 BV (SUTURE) ×2 IMPLANT
SUT SILK 2 0 SH (SUTURE) IMPLANT
SUT VIC AB 3-0 SH 27 (SUTURE) ×1
SUT VIC AB 3-0 SH 27X BRD (SUTURE) ×1 IMPLANT
TOWEL GREEN STERILE (TOWEL DISPOSABLE) ×2 IMPLANT
UNDERPAD 30X30 (UNDERPADS AND DIAPERS) ×2 IMPLANT
WATER STERILE IRR 1000ML POUR (IV SOLUTION) ×2 IMPLANT

## 2017-08-17 NOTE — Anesthesia Preprocedure Evaluation (Signed)
Anesthesia Evaluation  Patient identified by MRN, date of birth, ID band Patient awake    Reviewed: Allergy & Precautions, H&P , NPO status , Patient's Chart, lab work & pertinent test results  Airway Mallampati: II  TM Distance: >3 FB Neck ROM: Full    Dental no notable dental hx. (+) Edentulous Upper, Partial Lower, Poor Dentition, Dental Advisory Given   Pulmonary neg pulmonary ROS, former smoker,    Pulmonary exam normal breath sounds clear to auscultation       Cardiovascular + Peripheral Vascular Disease and +CHF   Rhythm:Regular Rate:Normal     Neuro/Psych Anxiety Depression negative neurological ROS  negative psych ROS   GI/Hepatic negative GI ROS, Neg liver ROS,   Endo/Other  diabetes, Insulin Dependent  Renal/GU ESRF and DialysisRenal disease  negative genitourinary   Musculoskeletal  (+) Arthritis , Osteoarthritis,    Abdominal   Peds  Hematology negative hematology ROS (+) anemia ,   Anesthesia Other Findings   Reproductive/Obstetrics negative OB ROS                             Lab Results  Component Value Date   WBC 5.9 06/25/2016   HGB 10.9 (L) 08/17/2017   HCT 32.0 (L) 08/17/2017   MCV 96.6 06/25/2016   PLT 313 06/25/2016   Lab Results  Component Value Date   CREATININE 8.47 (H) 06/25/2016   BUN 48 (H) 06/25/2016   NA 139 08/17/2017   K 4.7 08/17/2017   CL 102 06/25/2016   CO2 26 06/25/2016    Anesthesia Physical  Anesthesia Plan  ASA: III  Anesthesia Plan: General   Post-op Pain Management:    Induction: Intravenous  PONV Risk Score and Plan: 3 and Ondansetron, Midazolam and Dexamethasone  Airway Management Planned: LMA  Additional Equipment:   Intra-op Plan:   Post-operative Plan: Extubation in OR  Informed Consent: I have reviewed the patients History and Physical, chart, labs and discussed the procedure including the risks, benefits and  alternatives for the proposed anesthesia with the patient or authorized representative who has indicated his/her understanding and acceptance.   Dental advisory given  Plan Discussed with: CRNA  Anesthesia Plan Comments:         Anesthesia Quick Evaluation

## 2017-08-17 NOTE — Op Note (Signed)
    Patient name: Brendan Campbell MRN: [id number] DOB: 1970-06-19 Sex: male  08/17/2017 Pre-operative Diagnosis: End-stage renal disease Post-operative diagnosis:  Same Surgeon:  Erlene Quan C. Donzetta Matters, MD Assistant: Leontine Locket, PA Procedure Performed:  left-sided second stage basilic vein transposition fistula  Indications: 47 year old male with end-stage renal disease currently dialyzing via catheter.  He has a previous first stage left basilic vein transposition fistula that has mostly matured but is diminutive near the antecubitum.  He is now indicated for second stage procedure.  Findings: Fistula was quite diminutive near the antecubitum but had very strong inflow.  The rest of the vein had matured to diameter about 1 cm.  The nerve was protected throughout its course.  At completion there was a thrill in the runoff vein as well as palpable radial pulse at the wrist.   Procedure:  The patient was identified in the holding area and taken to the operating room where he was placed supine on the operative table and LMA was placed.  He was sterilely prepped and draped in the left upper extremity he was given antibiotics and a timeout was called.  I extended the previous incision from above the antecubitum dissected directly down onto the vein protecting the nerve.  I then dissected the vein dividing branches from clips and ties.  I made a second incision near the axilla where I could identify the vein.  When all branches have been divided I marked the vein for orientation.  I clamped it near the antecubitum and axilla.  He was divided near the antecubitum.  I then tunneled it after flushing with heparinized saline.  It was reclamped and again flushed with heparinized saline and clamped again.  I then spatulated both ends including the diminutive aspect near the antecubitum.  I also excised part of this where it was very sclerotic.  Test and had very strong inflow.  I then sewed end-to-end with two 6-0  Prolene sutures.  Prior to completion I allowed flushing from both directions and flushed with heparinized saline.  Upon completion I then had to divide some soft tissue in the tunnel and then had good outflow there is a palpable radial pulse.  Satisfied with this I obtained hemostasis irrigated both wounds closed in 2 layers with Vicryl Monocryl.  Dermabond was placed to level the skin.  Patient tolerated procedure without immediate complication.  All counts were correct at completion.  Next  EBL 50 cc    Delorise Hunkele C. Donzetta Matters, MD [facility] [place] Office: [phone] Pager: [phone]

## 2017-08-17 NOTE — H&P (Signed)
   History and Physical Update  The patient was interviewed and re-examined.  The patient's previous History and Physical has been reviewed and is unchanged from recent office visit with Dr. Donnetta Hutching. Plan for left 2nd stage basilic vein transposition avf. If vein is inadequate will plan to place a graft.   Brendan Campbell C. Donzetta Matters, MD [facility] [place] Office: [phone] Pager: [phone]   08/17/2017, 9:32 AM

## 2017-08-17 NOTE — Discharge Instructions (Signed)
° °  [facility] Specialists of [facility]  Discharge Instructions  AV Fistula or Graft Surgery for Dialysis Access  Please refer to the following instructions for your post-procedure care. Your surgeon or physician assistant will discuss any changes with you.  Activity  You may drive the day following your surgery, if you are comfortable and no longer taking prescription pain medication. Resume full activity as the soreness in your incision resolves.  Bathing/Showering  You may shower after you go home. Keep your incision dry for 48 hours. Do not soak in a bathtub, hot tub, or swim until the incision heals completely. You may not shower if you have a hemodialysis catheter.  Incision Care  Clean your incision with mild soap and water after 48 hours. Pat the area dry with a clean towel. You do not need a bandage unless otherwise instructed. Do not apply any ointments or creams to your incision. You may have skin glue on your incision. Do not peel it off. It will come off on its own in about one week. Your arm may swell a bit after surgery. To reduce swelling use pillows to elevate your arm so it is above your heart. Your doctor will tell you if you need to lightly wrap your arm with an ACE bandage.  Diet  Resume your normal diet. There are not special food restrictions following this procedure. In order to heal from your surgery, it is CRITICAL to get adequate nutrition. Your body requires vitamins, minerals, and protein. Vegetables are the best source of vitamins and minerals. Vegetables also provide the perfect balance of protein. Processed food has little nutritional value, so try to avoid this.  Medications  Resume taking all of your medications. If your incision is causing pain, you may take over-the counter pain relievers such as acetaminophen (Tylenol). If you were prescribed a stronger pain medication, please be aware these medications can cause nausea and constipation. Prevent  nausea by taking the medication with a snack or meal. Avoid constipation by drinking plenty of fluids and eating foods with high amount of fiber, such as fruits, vegetables, and grains.  Do not take Tylenol if you are taking prescription pain medications.  Follow up Your surgeon may want to see you in the office following your access surgery. If so, this will be arranged at the time of your surgery.  Please call us immediately for any of the following conditions:  Increased pain, redness, drainage (pus) from your incision site Fever of 101 degrees or higher Severe or worsening pain at your incision site Hand pain or numbness.  Reduce your risk of vascular disease:  Stop smoking. If you would like help, call [facility] at 1-800-QUIT-NOW (601)172-3365) or [place] at [place] your cholesterol Maintain a desired weight Control your diabetes Keep your blood pressure down  Dialysis  It will take several weeks to several months for your new dialysis access to be ready for use. Your surgeon will determine when it is okay to use it. Your nephrologist will continue to direct your dialysis. You can continue to use your Permcath until your new access is ready for use.   08/17/2017 [facility] [id number] 1970/05/27  Surgeon(s): Waynetta Sandy, MD  Procedure(s): SECOND STAGE BASILIC VEIN TRANSPOSITION LEFT ARM  x Do not stick fistula for 4 weeks    If you have any questions, please call the office at [phone].

## 2017-08-17 NOTE — Transfer of Care (Signed)
Immediate Anesthesia Transfer of Care Note  Patient: Brendan Campbell  Procedure(s) Performed: SECOND STAGE BASILIC VEIN TRANSPOSITION LEFT ARM (Left Arm Upper)  Patient Location: PACU  Anesthesia Type:General  Level of Consciousness: drowsy and patient cooperative  Airway & Oxygen Therapy: Patient Spontanous Breathing and Patient connected to nasal cannula oxygen  Post-op Assessment: Report given to RN, Post -op Vital signs reviewed and stable and Patient moving all extremities X 4  Post vital signs: Reviewed and stable  Last Vitals:  Vitals Value Taken Time  BP 118/86 08/17/2017  1:16 PM  Temp    Pulse 78 08/17/2017  1:21 PM  Resp 13 08/17/2017  1:21 PM  SpO2 99 % 08/17/2017  1:21 PM  Vitals shown include unvalidated device data.  Last Pain:  Vitals:   08/17/17 0947  TempSrc:   PainSc: 0-No pain      Patients Stated Pain Goal: 3 (94/50/38 [id number])  Complications: No apparent anesthesia complications

## 2017-08-17 NOTE — Progress Notes (Addendum)
Patient ready for discharge. He has used [employer] transportation service to get to the hospital and is planning on leaving with them as well. He has a roommate at home but unable to reach because patient does not know phone number. Went over discharge instructions with patient and spoke with Dr. Roanna Banning to ensure that it was okay to discharge patient with current transportation and home situation. Dr. Roanna Banning came by to see patient and confirmed it was okay for patient to be discharged home by [employer] transportation service to roommate at home. Spoke with the transporter to ensure that the patient's roommate sees discharge instructions and stays with patient for at least 24 hours.

## 2017-08-17 NOTE — Telephone Encounter (Signed)
sch appt lvm 09/01/17 1pm wound check

## 2017-08-17 NOTE — Anesthesia Procedure Notes (Signed)
Procedure Name: LMA Insertion Date/Time: 08/17/2017 11:46 AM Performed by: Colin Benton, CRNA Pre-anesthesia Checklist: Patient identified, Emergency Drugs available, Suction available and Patient being monitored Patient Re-evaluated:Patient Re-evaluated prior to induction Oxygen Delivery Method: Circle system utilized Preoxygenation: Pre-oxygenation with 100% oxygen Induction Type: IV induction Ventilation: Mask ventilation without difficulty LMA: LMA inserted LMA Size: 5.0 Number of attempts: 1 Placement Confirmation: positive ETCO2 and breath sounds checked- equal and bilateral Tube secured with: Tape Dental Injury: Teeth and Oropharynx as per pre-operative assessment

## 2017-08-18 ENCOUNTER — Encounter (HOSPITAL_COMMUNITY): Payer: Self-pay | Admitting: Vascular Surgery

## 2017-08-18 NOTE — Anesthesia Postprocedure Evaluation (Signed)
Anesthesia Post Note  Patient: Brendan Campbell  Procedure(s) Performed: SECOND STAGE BASILIC VEIN TRANSPOSITION LEFT ARM (Left Arm Upper)     Patient location during evaluation: PACU Anesthesia Type: General Level of consciousness: awake and alert Pain management: pain level controlled Vital Signs Assessment: post-procedure vital signs reviewed and stable Respiratory status: spontaneous breathing, nonlabored ventilation, respiratory function stable and patient connected to nasal cannula oxygen Cardiovascular status: blood pressure returned to baseline and stable Postop Assessment: no apparent nausea or vomiting Anesthetic complications: no    Last Vitals:  Vitals:   08/17/17 1615 08/17/17 1616  BP:  (!) 141/83  Pulse: 78 79  Resp: 13 15  Temp:  (!) 36.3 C  SpO2: 99% 99%    Last Pain:  Vitals:   08/17/17 1615  TempSrc:   PainSc: 4                  Tiajuana Amass

## 2017-09-01 ENCOUNTER — Ambulatory Visit: Payer: Medicaid Other

## 2017-09-07 ENCOUNTER — Encounter: Payer: Self-pay | Admitting: Physician Assistant

## 2017-09-07 ENCOUNTER — Ambulatory Visit (INDEPENDENT_AMBULATORY_CARE_PROVIDER_SITE_OTHER): Payer: Self-pay | Admitting: Physician Assistant

## 2017-09-07 ENCOUNTER — Encounter: Payer: Self-pay | Admitting: *Deleted

## 2017-09-07 ENCOUNTER — Other Ambulatory Visit: Payer: Self-pay

## 2017-09-07 ENCOUNTER — Other Ambulatory Visit: Payer: Self-pay | Admitting: *Deleted

## 2017-09-07 VITALS — BP 150/93 | HR 83 | Temp 98.8°F | Resp 18 | Ht 72.0 in | Wt 218.0 lb

## 2017-09-07 DIAGNOSIS — Z992 Dependence on renal dialysis: Secondary | ICD-10-CM

## 2017-09-07 DIAGNOSIS — N186 End stage renal disease: Secondary | ICD-10-CM

## 2017-09-07 NOTE — Progress Notes (Signed)
Vitals:   09/07/17 1345  BP: (!) 150/88  Pulse: 85  Resp: 18  Temp: 98.8 F (37.1 C)  TempSrc: Oral  SpO2: 98%  Weight: 218 lb (98.9 kg)  Height: 6' (1.829 m)

## 2017-09-07 NOTE — Progress Notes (Signed)
Established Dialysis Access   History of Present Illness   Brendan Campbell is a 47 y.o. (May 20, 1970) male who presents for re-evaluation for permanent access.  The patient is right hand dominant.  Previous access procedures have been completed in the left arm which include 2nd stage basilic vein transposition by Dr. Donzetta Matters 08/17/17.  L arm fistula is now known to be occluded.  Dr. Donzetta Matters was considering L arm AV graft due to inadequate BUE vein conduits however intraoperatively L basilic vein appeared adequate.  Patient is dialyzing on MWF schedule via R IJ TDC without complication.  He is going to the beach the second weekend in August and would prefer to have new access placed when he returns.  The patient's PMH, PSH, SH, and FamHx were reviewed and are unchanged from prior visit.  Current Outpatient Medications  Medication Sig Dispense Refill  . aspirin EC 81 MG tablet Take 81 mg by mouth daily.    [name] atorvastatin (LIPITOR) 10 MG tablet Take 1 tablet (10 mg total) by mouth every evening. 90 tablet 3  . baclofen (LIORESAL) 10 MG tablet Take 1 tablet (10 mg total) by mouth every 8 (eight) hours as needed for muscle spasms. 60 tablet 3  . blood glucose meter kit and supplies KIT Dispense based on patient and insurance preference. Use up to four times daily as directed. (FOR ICD-9 250.00, 250.01). 1 each 0  . calcium carbonate (TUMS - DOSED IN MG ELEMENTAL CALCIUM) 500 MG chewable tablet Chew 3 tablets by mouth 2 (two) times daily with a meal.     . Insulin Glargine (LANTUS SOLOSTAR) 100 UNIT/ML Solostar Pen Inject 10-15 Units into the skin daily at 10 pm. (Patient taking differently: Inject 10 Units into the skin daily at 10 pm. ) 5 pen PRN  . polyethylene glycol powder (GLYCOLAX/MIRALAX) powder 1/2 to 2 capfuls daily for constipation. (Patient taking differently: Take 8.5 g by mouth daily as needed for moderate constipation. ) 3350 g 1  . SURE COMFORT PEN NEEDLES 31G X 5 MM MISC USE AS DIRECTED  DAILY. 100 each 2  . oxyCODONE-acetaminophen (PERCOCET/ROXICET) 5-325 MG tablet Take 1 tablet by mouth every 6 (six) hours as needed. (Patient not taking: Reported on 09/07/2017) 8 tablet 0   No current facility-administered medications for this visit.     On ROS today: 10 system ROS is negative unless otherwise noted in HPI   Physical Examination   Vitals:   09/07/17 1345 09/07/17 1348  BP: (!) 150/88 (!) 150/93  Pulse: 85 83  Resp: 18   Temp: 98.8 F (37.1 C)   TempSrc: Oral   SpO2: 98%   Weight: 218 lb (98.9 kg)   Height: 6' (1.829 m)    Body mass index is 29.57 kg/m.  General Alert, O x 3, WD, NAD  Pulmonary Sym exp, good B air movt,  Cardiac RRR, Nl S1, S2,   Vascular Vessel Right Left  Radial Palpable Palpable  Brachial Palpable Palpable  Ulnar Not palpable Not palpable    Musculo- skeletal No thrill or bruit L arm fistula; palpable pulse of anastomosis stump L AC M/S 5/5 throughout  , Extremities without ischemic changes    Neurologic A&O; CN grossly intact     Non-invasive Vascular Imaging   Occluded L arm AVF by bedside US   Medical Decision Making   Brendan Campbell is a 47 y.o. male who presents with ESRD requiring hemodialysis.    L arm basilic fistula  occluded by exam and [place]  Plan will be for L arm AV graft placement by Dr. Donzetta Matters 09/21/17 Risk, benefits, and alternatives to access surgery were discussed.   The patient is aware the risks include but are not limited to: bleeding, infection, steal syndrome, nerve damage, failure to mature, and need for additional procedures.   The patient agrees to proceed forward with the procedure.   Dagoberto Ligas PA-C [facility] Office: [phone]

## 2017-09-20 ENCOUNTER — Encounter (HOSPITAL_COMMUNITY): Payer: Self-pay | Admitting: *Deleted

## 2017-09-20 ENCOUNTER — Other Ambulatory Visit: Payer: Self-pay

## 2017-09-20 NOTE — Progress Notes (Signed)
Spoke with pt for pre-op call. Pt denies cardiac history, chest pain or sob. Pt states he does not have HTN. Pt is a type 2 diabetic. He doesn't know what his last A1C was. States [facility] in [place] would have it. He states his fasting blood sugar is usually between 80-120. Pt instructed to take 1/2 of his regular dose of Lantus Insulin tonight, he is to take 5 units. Pt instructed to check his blood sugar when he gets up in the AM and every 2 hours until he leaves for the hospital. If blood sugar is 70 or below, treat with 1/2 cup of clear juice (apple or cranberry) and recheck blood sugar 15 minutes after drinking juice. If blood sugar continues to be 70 or below, call the Short Stay department and ask to speak to a nurse. Pt voiced understanding. I called [facility] in [place] and spoke with Joy, RN for A1C result. She asked if the pt was here with me, I told her no, that I had just spoken with him though. She states pt did not show for his dialysis today and did not call either. She states he usually does not show up for his Wednesday appts. Pt's most recent A1C was 4.9 on 07/31/17.  Called Dr. Claretha Cooper office and notified them that pt did not have dialysis today, spoke with Lattie Haw.

## 2017-09-21 ENCOUNTER — Encounter (HOSPITAL_COMMUNITY): Payer: Self-pay | Admitting: Anesthesiology

## 2017-09-21 ENCOUNTER — Ambulatory Visit (HOSPITAL_COMMUNITY): Admission: RE | Admit: 2017-09-21 | Payer: Medicaid Other | Source: Ambulatory Visit | Admitting: Vascular Surgery

## 2017-09-21 SURGERY — INSERTION OF ARTERIOVENOUS (AV) GORE-TEX GRAFT ARM
Anesthesia: Monitor Anesthesia Care | Laterality: Left

## 2017-09-21 NOTE — Anesthesia Preprocedure Evaluation (Deleted)
Anesthesia Evaluation    Reviewed: Allergy & Precautions, Patient's Chart, lab work & pertinent test results  History of Anesthesia Complications Negative for: history of anesthetic complications  Airway        Dental   Pulmonary former smoker,           Cardiovascular + Peripheral Vascular Disease and +CHF       Neuro/Psych Anxiety Depression  PTSD negative neurological ROS     GI/Hepatic negative GI ROS, Neg liver ROS,   Endo/Other  diabetes, Insulin Dependent Obesity   Renal/GU ESRF and DialysisRenal disease  negative genitourinary   Musculoskeletal  (+) Arthritis ,   Abdominal   Peds  Hematology  (+) anemia ,   Anesthesia Other Findings   Reproductive/Obstetrics                             Anesthesia Physical Anesthesia Plan  ASA: III  Anesthesia Plan: MAC   Post-op Pain Management:    Induction: Intravenous  PONV Risk Score and Plan: 1 and Propofol infusion and Treatment may vary due to age or medical condition  Airway Management Planned: Nasal Cannula and Natural Airway  Additional Equipment: None  Intra-op Plan:   Post-operative Plan:   Informed Consent:   Plan Discussed with: CRNA and Anesthesiologist  Anesthesia Plan Comments:         Anesthesia Quick Evaluation

## 2017-10-03 ENCOUNTER — Other Ambulatory Visit: Payer: Self-pay | Admitting: *Deleted

## 2017-10-14 ENCOUNTER — Encounter (HOSPITAL_COMMUNITY): Payer: Self-pay | Admitting: *Deleted

## 2017-10-14 NOTE — Progress Notes (Signed)
Spoke with pt for pre-op call. Pt denies cardiac history, chest pain or sob. Pt instructed to take 1/2 of his regular dose of Lantus Insulin tonight, he is to take 5 units. Pt instructed to check his blood sugar when he gets up in the AM and every 2 hours until he leaves for the hospital. If blood sugar is 70 or below, treat with 1/2 cup of clear juice (apple or cranberry) and recheck blood sugar 15 minutes after drinking juice. If blood sugar continues to be 70 or below, call the Short Stay department and ask to speak to a nurse. Pt voiced understanding.

## 2017-10-17 ENCOUNTER — Ambulatory Visit (HOSPITAL_COMMUNITY): Admission: RE | Admit: 2017-10-17 | Payer: Medicaid Other | Source: Ambulatory Visit | Admitting: Vascular Surgery

## 2017-10-17 SURGERY — INSERTION OF ARTERIOVENOUS (AV) GORE-TEX GRAFT ARM
Anesthesia: Monitor Anesthesia Care | Laterality: Left

## 2017-10-17 MED ORDER — CEFAZOLIN SODIUM-DEXTROSE 2-4 GM/100ML-% IV SOLN
2.0000 g | INTRAVENOUS | Status: AC
  Administered 2017-10-31: 2 g via INTRAVENOUS
  Filled 2017-10-17: qty 100

## 2017-10-30 ENCOUNTER — Other Ambulatory Visit: Payer: Self-pay

## 2017-10-30 ENCOUNTER — Encounter (HOSPITAL_COMMUNITY): Payer: Self-pay | Admitting: *Deleted

## 2017-10-30 NOTE — Progress Notes (Signed)
Pt denies SOB and chest pain. Pt under the care of DR. Branch, Cardiology. Pt denies having a cardiac cath but stated that an echo was performed at [facility] in [place] , [place]; records requested. Pt made aware to stop taking vitamins, fish oil and herbal medications. Do not take any NSAIDs ie: Ibuprofen, Advil, Naproxen (Aleve), Motrin, BC and Goody Powder. Pt made aware to take 5 units of Lantus insulin tonight Pt made aware to check BG every 2 hours prior to arrival to hospital on DOS. Pt made aware to treat a BG < 70 with 4 ounces of apple or cranberry juice, wait 15 minutes after intervention to recheck BG, if BG remains < 70, call Short Stay unit to speak with a nurse. Pt verbalized understanding of all pre-op instructions.

## 2017-10-31 ENCOUNTER — Encounter (HOSPITAL_COMMUNITY): Payer: Self-pay | Admitting: Surgery

## 2017-10-31 ENCOUNTER — Ambulatory Visit (HOSPITAL_COMMUNITY): Payer: Medicaid Other | Admitting: Certified Registered Nurse Anesthetist

## 2017-10-31 ENCOUNTER — Ambulatory Visit (HOSPITAL_COMMUNITY)
Admission: RE | Admit: 2017-10-31 | Discharge: 2017-10-31 | Disposition: A | Discharge status: Home / Self Care | Payer: Medicaid Other | Source: Ambulatory Visit | Attending: Vascular Surgery | Admitting: Vascular Surgery

## 2017-10-31 ENCOUNTER — Encounter (HOSPITAL_COMMUNITY)
Admission: RE | Disposition: A | Discharge status: Home / Self Care | Payer: Self-pay | Source: Ambulatory Visit | Attending: Vascular Surgery

## 2017-10-31 DIAGNOSIS — E1151 Type 2 diabetes mellitus with diabetic peripheral angiopathy without gangrene: Secondary | ICD-10-CM | POA: Diagnosis not present

## 2017-10-31 DIAGNOSIS — Z992 Dependence on renal dialysis: Secondary | ICD-10-CM

## 2017-10-31 DIAGNOSIS — Z79899 Other long term (current) drug therapy: Secondary | ICD-10-CM | POA: Diagnosis not present

## 2017-10-31 DIAGNOSIS — Z87891 Personal history of nicotine dependence: Secondary | ICD-10-CM | POA: Insufficient documentation

## 2017-10-31 DIAGNOSIS — Z794 Long term (current) use of insulin: Secondary | ICD-10-CM | POA: Insufficient documentation

## 2017-10-31 DIAGNOSIS — Y832 Surgical operation with anastomosis, bypass or graft as the cause of abnormal reaction of the patient, or of later complication, without mention of misadventure at the time of the procedure: Secondary | ICD-10-CM | POA: Insufficient documentation

## 2017-10-31 DIAGNOSIS — T82868A Thrombosis of vascular prosthetic devices, implants and grafts, initial encounter: Secondary | ICD-10-CM | POA: Insufficient documentation

## 2017-10-31 DIAGNOSIS — Z7982 Long term (current) use of aspirin: Secondary | ICD-10-CM | POA: Diagnosis not present

## 2017-10-31 DIAGNOSIS — N186 End stage renal disease: Secondary | ICD-10-CM | POA: Diagnosis not present

## 2017-10-31 DIAGNOSIS — D631 Anemia in chronic kidney disease: Secondary | ICD-10-CM | POA: Insufficient documentation

## 2017-10-31 DIAGNOSIS — E1122 Type 2 diabetes mellitus with diabetic chronic kidney disease: Secondary | ICD-10-CM | POA: Diagnosis present

## 2017-10-31 HISTORY — DX: Presence of spectacles and contact lenses: Z97.3

## 2017-10-31 HISTORY — PX: AV FISTULA PLACEMENT: SHX1204

## 2017-10-31 LAB — GLUCOSE, CAPILLARY
GLUCOSE-CAPILLARY: 94 mg/dL (ref 70–99)
GLUCOSE-CAPILLARY: 77 mg/dL (ref 70–99)
GLUCOSE-CAPILLARY: 106 mg/dL — AB (ref 70–99)
Glucose-Capillary: 72 mg/dL (ref 70–99)

## 2017-10-31 LAB — POCT I-STAT 4, (NA,K, GLUC, HGB,HCT)
Glucose, Bld: 97 mg/dL (ref 70–99)
HEMATOCRIT: 32 % — AB (ref 39.0–52.0)
HEMOGLOBIN: 10.9 g/dL — AB (ref 13.0–17.0)
Potassium: 3.7 mmol/L (ref 3.5–5.1)
Sodium: 141 mmol/L (ref 135–145)

## 2017-10-31 LAB — HEMOGLOBIN A1C
Hgb A1c MFr Bld: 5.1 % (ref 4.8–5.6)
Mean Plasma Glucose: 99.67 mg/dL

## 2017-10-31 SURGERY — INSERTION OF ARTERIOVENOUS (AV) GORE-TEX GRAFT ARM
Anesthesia: General | Laterality: Left

## 2017-10-31 MED ORDER — OXYCODONE-ACETAMINOPHEN 5-325 MG PO TABS: 1.0000 | ORAL_TABLET | Freq: Four times a day (QID) | ORAL | 0 refills | Status: DC | PRN

## 2017-10-31 MED ORDER — PHENYLEPHRINE HCL 10 MG/ML IJ SOLN
INTRAMUSCULAR | Status: DC | PRN
  Administered 2017-10-31 (×2): 80 ug via INTRAVENOUS

## 2017-10-31 MED ORDER — MIDAZOLAM HCL 5 MG/5ML IJ SOLN
INTRAMUSCULAR | Status: DC | PRN
  Administered 2017-10-31: 2 mg via INTRAVENOUS

## 2017-10-31 MED ORDER — SODIUM CHLORIDE 0.9 % IV SOLN
INTRAVENOUS | Status: AC
  Filled 2017-10-31: qty 1.2

## 2017-10-31 MED ORDER — SODIUM CHLORIDE 0.9 % IV SOLN
INTRAVENOUS | Status: DC | PRN
  Administered 2017-10-31: 10:00:00

## 2017-10-31 MED ORDER — ONDANSETRON HCL 4 MG/2ML IJ SOLN
INTRAMUSCULAR | Status: AC
  Filled 2017-10-31: qty 2

## 2017-10-31 MED ORDER — PHENYLEPHRINE 40 MCG/ML (10ML) SYRINGE FOR IV PUSH (FOR BLOOD PRESSURE SUPPORT)
PREFILLED_SYRINGE | INTRAVENOUS | Status: AC
  Filled 2017-10-31: qty 20

## 2017-10-31 MED ORDER — FENTANYL CITRATE (PF) 250 MCG/5ML IJ SOLN
INTRAMUSCULAR | Status: AC
  Filled 2017-10-31: qty 5

## 2017-10-31 MED ORDER — LIDOCAINE HCL (CARDIAC) PF 100 MG/5ML IV SOSY
PREFILLED_SYRINGE | INTRAVENOUS | Status: DC | PRN
  Administered 2017-10-31: 60 mg via INTRAVENOUS

## 2017-10-31 MED ORDER — PROPOFOL 10 MG/ML IV BOLUS
INTRAVENOUS | Status: DC | PRN
  Administered 2017-10-31: 120 mg via INTRAVENOUS

## 2017-10-31 MED ORDER — PROTAMINE SULFATE 10 MG/ML IV SOLN
INTRAVENOUS | Status: AC
  Filled 2017-10-31: qty 5

## 2017-10-31 MED ORDER — ROCURONIUM BROMIDE 50 MG/5ML IV SOSY
PREFILLED_SYRINGE | INTRAVENOUS | Status: AC
  Filled 2017-10-31: qty 5

## 2017-10-31 MED ORDER — MIDAZOLAM HCL 2 MG/2ML IJ SOLN
INTRAMUSCULAR | Status: AC
  Filled 2017-10-31: qty 2

## 2017-10-31 MED ORDER — FENTANYL CITRATE (PF) 100 MCG/2ML IJ SOLN
INTRAMUSCULAR | Status: AC
  Filled 2017-10-31: qty 2

## 2017-10-31 MED ORDER — SODIUM CHLORIDE 0.9 % IV SOLN
INTRAVENOUS | Status: DC | PRN
  Administered 2017-10-31 (×2): via INTRAVENOUS

## 2017-10-31 MED ORDER — ONDANSETRON HCL 4 MG/2ML IJ SOLN
INTRAMUSCULAR | Status: DC | PRN
  Administered 2017-10-31: 4 mg via INTRAVENOUS

## 2017-10-31 MED ORDER — FENTANYL CITRATE (PF) 100 MCG/2ML IJ SOLN
INTRAMUSCULAR | Status: DC | PRN
  Administered 2017-10-31: 50 ug via INTRAVENOUS

## 2017-10-31 MED ORDER — FENTANYL CITRATE (PF) 100 MCG/2ML IJ SOLN
25.0000 ug | INTRAMUSCULAR | Status: DC | PRN
  Administered 2017-10-31: 25 ug via INTRAVENOUS

## 2017-10-31 MED ORDER — OXYCODONE-ACETAMINOPHEN 5-325 MG PO TABS
ORAL_TABLET | ORAL | Status: AC
  Filled 2017-10-31: qty 1

## 2017-10-31 MED ORDER — SODIUM CHLORIDE 0.9 % IV SOLN: INTRAVENOUS | Status: DC

## 2017-10-31 MED ORDER — OXYCODONE-ACETAMINOPHEN 5-325 MG PO TABS
1.0000 | ORAL_TABLET | Freq: Once | ORAL | Status: AC
  Administered 2017-10-31: 1 via ORAL

## 2017-10-31 MED ORDER — LIDOCAINE-EPINEPHRINE (PF) 1 %-1:200000 IJ SOLN
INTRAMUSCULAR | Status: AC
  Filled 2017-10-31: qty 30

## 2017-10-31 MED ORDER — 0.9 % SODIUM CHLORIDE (POUR BTL) OPTIME
TOPICAL | Status: DC | PRN
  Administered 2017-10-31: 1000 mL

## 2017-10-31 MED ORDER — PROPOFOL 10 MG/ML IV BOLUS
INTRAVENOUS | Status: AC
  Filled 2017-10-31: qty 20

## 2017-10-31 SURGICAL SUPPLY — 36 items
ARMBAND PINK RESTRICT EXTREMIT (MISCELLANEOUS) ×4 IMPLANT
CANISTER SUCT 3000ML PPV (MISCELLANEOUS) ×2 IMPLANT
CLIP VESOCCLUDE MED 6/CT (CLIP) ×2 IMPLANT
CLIP VESOCCLUDE SM WIDE 6/CT (CLIP) ×2 IMPLANT
DERMABOND ADVANCED (GAUZE/BANDAGES/DRESSINGS) ×1
DERMABOND ADVANCED .7 DNX12 (GAUZE/BANDAGES/DRESSINGS) ×1 IMPLANT
ELECT REM PT RETURN 9FT ADLT (ELECTROSURGICAL) ×2
ELECTRODE REM PT RTRN 9FT ADLT (ELECTROSURGICAL) ×1 IMPLANT
GLOVE BIO SURGEON STRL SZ 6.5 (GLOVE) ×8 IMPLANT
GLOVE BIO SURGEON STRL SZ7.5 (GLOVE) ×4 IMPLANT
GLOVE BIOGEL PI IND STRL 6.5 (GLOVE) ×3 IMPLANT
GLOVE BIOGEL PI INDICATOR 6.5 (GLOVE) ×3
GOWN STRL REUS W/ TWL LRG LVL3 (GOWN DISPOSABLE) ×2 IMPLANT
GOWN STRL REUS W/ TWL XL LVL3 (GOWN DISPOSABLE) ×1 IMPLANT
GOWN STRL REUS W/TWL LRG LVL3 (GOWN DISPOSABLE) ×2
GOWN STRL REUS W/TWL XL LVL3 (GOWN DISPOSABLE) ×1
GRAFT GORETEX STRT 4-7X45 (Vascular Products) ×2 IMPLANT
HEMOSTAT SNOW SURGICEL 2X4 (HEMOSTASIS) IMPLANT
INSERT FOGARTY SM (MISCELLANEOUS) ×2 IMPLANT
KIT BASIN OR (CUSTOM PROCEDURE TRAY) ×2 IMPLANT
KIT TURNOVER KIT B (KITS) ×2 IMPLANT
NS IRRIG 1000ML POUR BTL (IV SOLUTION) ×2 IMPLANT
PACK CV ACCESS (CUSTOM PROCEDURE TRAY) ×2 IMPLANT
PAD ARMBOARD 7.5X6 YLW CONV (MISCELLANEOUS) ×4 IMPLANT
SUT GORETEX 6.0 TH-9 30 IN (SUTURE) IMPLANT
SUT GORETEX CV-6TTC-13 36IN (SUTURE) IMPLANT
SUT MNCRL AB 4-0 PS2 18 (SUTURE) ×2 IMPLANT
SUT PROLENE 5 0 C 1 24 (SUTURE) ×2 IMPLANT
SUT PROLENE 6 0 BV (SUTURE) IMPLANT
SUT SILK 2 0 SH (SUTURE) IMPLANT
SUT VIC AB 3-0 SH 27 (SUTURE) ×3
SUT VIC AB 3-0 SH 27X BRD (SUTURE) ×3 IMPLANT
SYR TOOMEY 50ML (SYRINGE) IMPLANT
TOWEL GREEN STERILE (TOWEL DISPOSABLE) ×2 IMPLANT
UNDERPAD 30X30 (UNDERPADS AND DIAPERS) ×2 IMPLANT
WATER STERILE IRR 1000ML POUR (IV SOLUTION) ×2 IMPLANT

## 2017-10-31 NOTE — Anesthesia Postprocedure Evaluation (Signed)
Anesthesia Post Note  Patient: Brendan Campbell  Procedure(s) Performed: INSERTION OF ARTERIOVENOUS (AV) GORE-TEX 4-38mm STETCH GRAFT LEFT ARM (Left )     Patient location during evaluation: PACU Anesthesia Type: General Level of consciousness: awake and alert Pain management: pain level controlled Vital Signs Assessment: post-procedure vital signs reviewed and stable Respiratory status: spontaneous breathing, nonlabored ventilation and respiratory function stable Cardiovascular status: blood pressure returned to baseline and stable Postop Assessment: no apparent nausea or vomiting Anesthetic complications: no    Last Vitals:  Vitals:   10/31/17 1205 10/31/17 1218  BP: 124/84 130/84  Pulse: 80 77  Resp: 19 18  Temp:    SpO2: 93% 93%    Last Pain:  Vitals:   10/31/17 1218  PainSc: 0-No pain                 Kano Heckmann,W. EDMOND

## 2017-10-31 NOTE — Discharge Instructions (Signed)
° °  [facility]  Discharge Instructions  AV Fistula or Graft Surgery for Dialysis Access  Please refer to the following instructions for your post-procedure care. Your surgeon or physician assistant will discuss any changes with you.  Activity  You may drive the day following your surgery, if you are comfortable and no longer taking prescription pain medication. Resume full activity as the soreness in your incision resolves.  Bathing/Showering  You may shower after you go home. Keep your incision dry for 48 hours. Do not soak in a bathtub, hot tub, or swim until the incision heals completely. You may not shower if you have a hemodialysis catheter.  Incision Care  Clean your incision with mild soap and water after 48 hours. Pat the area dry with a clean towel. You do not need a bandage unless otherwise instructed. Do not apply any ointments or creams to your incision. You may have skin glue on your incision. Do not peel it off. It will come off on its own in about one week. Your arm may swell a bit after surgery. To reduce swelling use pillows to elevate your arm so it is above your heart. Your doctor will tell you if you need to lightly wrap your arm with an ACE bandage.  Diet  Resume your normal diet. There are not special food restrictions following this procedure. In order to heal from your surgery, it is CRITICAL to get adequate nutrition. Your body requires vitamins, minerals, and protein. Vegetables are the best source of vitamins and minerals. Vegetables also provide the perfect balance of protein. Processed food has little nutritional value, so try to avoid this.  Medications  Resume taking all of your medications. If your incision is causing pain, you may take over-the counter pain relievers such as acetaminophen (Tylenol). If you were prescribed a stronger pain medication, please be aware these medications can cause nausea and constipation. Prevent  nausea by taking the medication with a snack or meal. Avoid constipation by drinking plenty of fluids and eating foods with high amount of fiber, such as fruits, vegetables, and grains.  Do not take Tylenol if you are taking prescription pain medications.  Follow up Your surgeon may want to see you in the office following your access surgery. If so, this will be arranged at the time of your surgery.  Please call us immediately for any of the following conditions:  Increased pain, redness, drainage (pus) from your incision site Fever of 101 degrees or higher Severe or worsening pain at your incision site Hand pain or numbness.  Reduce your risk of vascular disease:  Stop smoking. If you would like help, call [facility] at 1-800-QUIT-NOW 902-148-6579) or [place] at [place] your cholesterol Maintain a desired weight Control your diabetes Keep your blood pressure down  Dialysis  It will take several weeks to several months for your new dialysis access to be ready for use. Your surgeon will determine when it is okay to use it. Your nephrologist will continue to direct your dialysis. You can continue to use your Permcath until your new access is ready for use.   10/31/2017 Brendan Campbell [id number] 02/16/1970  Surgeon(s): Waynetta Sandy, MD  Procedure(s): INSERTION OF ARTERIOVENOUS (AV) GORE-TEX 4-50mm STETCH GRAFT LEFT ARM  x Do not stick graft for 4 weeks    If you have any questions, please call the office at [phone].

## 2017-10-31 NOTE — H&P (Signed)
History of Present Illness   Brendan Campbell is a 47 y.o. (1970-12-01) male who presents for re-evaluation for permanent access.  The patient is right hand dominant.  Previous access procedures have been completed in the left arm which include 2nd stage basilic vein transposition by Dr. Donzetta Matters 08/17/17.  L arm fistula is now known to be occluded.  Dr. Donzetta Matters was considering L arm AV graft due to inadequate BUE vein conduits however intraoperatively L basilic vein appeared adequate.  Patient is dialyzing on MWF schedule via R IJ TDC without complication.  He is going to the beach the second weekend in August and would prefer to have new access placed when he returns.  The patient's PMH, PSH, SH, and FamHx were reviewed and are unchanged from prior visit.        Current Outpatient Medications  Medication Sig Dispense Refill  . aspirin EC 81 MG tablet Take 81 mg by mouth daily.    [name] atorvastatin (LIPITOR) 10 MG tablet Take 1 tablet (10 mg total) by mouth every evening. 90 tablet 3  . baclofen (LIORESAL) 10 MG tablet Take 1 tablet (10 mg total) by mouth every 8 (eight) hours as needed for muscle spasms. 60 tablet 3  . blood glucose meter kit and supplies KIT Dispense based on patient and insurance preference. Use up to four times daily as directed. (FOR ICD-9 250.00, 250.01). 1 each 0  . calcium carbonate (TUMS - DOSED IN MG ELEMENTAL CALCIUM) 500 MG chewable tablet Chew 3 tablets by mouth 2 (two) times daily with a meal.     . Insulin Glargine (LANTUS SOLOSTAR) 100 UNIT/ML Solostar Pen Inject 10-15 Units into the skin daily at 10 pm. (Patient taking differently: Inject 10 Units into the skin daily at 10 pm. ) 5 pen PRN  . polyethylene glycol powder (GLYCOLAX/MIRALAX) powder 1/2 to 2 capfuls daily for constipation. (Patient taking differently: Take 8.5 g by mouth daily as needed for moderate constipation. ) 3350 g 1  . SURE COMFORT PEN NEEDLES 31G X 5 MM MISC USE AS DIRECTED DAILY. 100 each 2  .  oxyCODONE-acetaminophen (PERCOCET/ROXICET) 5-325 MG tablet Take 1 tablet by mouth every 6 (six) hours as needed. (Patient not taking: Reported on 09/07/2017) 8 tablet 0   No current facility-administered medications for this visit.     On ROS today: 10 system ROS is negative unless otherwise noted in HPI   Physical Examination   Vitals:   10/31/17 0806  BP: (!) 144/90  Pulse: 89  Resp: 20  Temp: (!) 97.5 F (36.4 C)  SpO2: 100%     General Alert, O x 3, WD, NAD  Pulmonary Sym exp, good B air movt,  Cardiac RRR, Nl S1, S2,   Vascular Vessel Right Left  Radial Palpable Palpable  Brachial Palpable Palpable  Ulnar Not palpable Not palpable    Musculo- skeletal No thrill or bruit L arm fistula; palpable pulse of anastomosis stump L AC M/S 5/5 throughout  , Extremities without ischemic changes    Neurologic A&O; CN grossly intact     Non-invasive Vascular Imaging   Occluded L arm AVF by bedside US   Medical Decision Making   Brendan Campbell is a 47 y.o. male who presents with ESRD requiring hemodialysis. Plan is for left arm av graft either brachial to axillary or axillary to axillary.   Navjot Loera C. Donzetta Matters, MD [facility] Office: [phone] Pager: [phone]

## 2017-10-31 NOTE — Op Note (Signed)
Patient name: Brendan Campbell MRN: [id number] DOB: 08/23/1970 Sex: male  10/31/2017 Pre-operative Diagnosis: End-stage renal disease Post-operative diagnosis:  Same Surgeon:  Erlene Quan C. Donzetta Matters, MD Assistant: Leontine Locket, PA Procedure Performed: Left upper arm brachial artery to axillary vein 4-7 mm AV graft  Indications: This 47 year old male with history of end-stage renal disease who has been on dialysis via a right IJ catheter.  He has a basilic vein fistula on the left x2 which is failed.  He is now indicated for grafting on the left side given that he is right-hand dominant.  Findings: Previous basilic vein fistula was sclerosed and occluded.  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  The brachial artery was larger than this radial artery proximally 3 mm external diameter above the antecubitum with minimal disease.  The axillary vein was large and free of disease.  I completion there was palpable pulsation within the graft strong signal in the runoff vein and there was a radial artery signal that did not augment with compression of the graft given that it was likely aberrant and not related to the anastomosis from the AV graft.   Procedure:  The patient was identified in the holding area and taken to the operating room where he was placed supine on the operating table and LMA was placed.  He was sterilely prepped and draped in the left upper extremity in the usual fashion antibiotics were administered and a timeout was called.  We began with a longitudinal incision in the axilla through his previous incisional scar.  We dissected down identified the sclerotic fistula.  We then identified the axillary vein which was healthy and not scarred in.  We then dissected this out placed a vessel loop around it.  Transverse incision through the previous incision above the antecubitum was then made we dissected out initially identified our brachial artery to basilic  vein anastomosis.  This artery appeared quite diminutive and I was able to find a separate pulsation within the wound leading me to believe that this was a aberrant radial artery used for the previous anastomosis.  Then dissected deeper identify the median nerve as well as paired brachial veins.  I did dissected out the artery placed a vessel loop around it.  A tunnel was then created between the 2 incision and a 4-7 mm graft was tunneled.  In the axilla I clamped the vein proximally distally opened longitudinally.  Graft was beveled on the 7 mm side and sewn end to side with 5-0 Prolene suture.  Upon completion I then flushed through the graft and flushed easily and did have good backbleeding.  I flushed again and clamped the graft itself.  I then clamped the brachial artery distally and proximally opened longitudinally and flushed in both directions.  The graft was then trimmed to size and sewn end-to-side with a 4 mm side with 6-0 Prolene suture.  This time prior to completion anastomosis I allowed antegrade backbleeding in all directions.  Upon completion there was a palpable pulse with compressing the graft and a strong signal throughout diastole in the runoff vein that was nearly nonexistent with compressing the graft.  There was a strong radial artery signal at the wrist that did not augment with compression of the graft as it was likely aberrant higher in the arm.  Satisfied with this we irrigated both wounds and obtained hemostasis.  Both were closed with 3-0 Vicryl and 4-0 Monocryl and Dermabond  was placed to level the skin.  He was then allowed to wake from anesthesia having tolerated the procedure well without immediate complication.  All counts were correct at completion.    EBL: 50 cc   Joley Utecht C. Donzetta Matters, MD [facility] [place] Office: [phone] Pager: [phone]

## 2017-10-31 NOTE — Anesthesia Preprocedure Evaluation (Deleted)
Anesthesia Evaluation  Patient identified by MRN, date of birth, ID band Patient awake    Reviewed: Allergy & Precautions, NPO status , Patient's Chart, lab work & pertinent test results  Airway Mallampati: III  TM Distance: >3 FB Neck ROM: Full    Dental no notable dental hx. (+) Teeth Intact, Dental Advisory Given   Pulmonary shortness of breath and with exertion, asthma , former smoker,    Pulmonary exam normal breath sounds clear to auscultation       Cardiovascular hypertension, Normal cardiovascular exam Rhythm:Regular Rate:Normal     Neuro/Psych negative neurological ROS  negative psych ROS   GI/Hepatic Neg liver ROS, GERD  Medicated,  Endo/Other  diabetes, Type 2  Renal/GU negative Renal ROS  negative genitourinary   Musculoskeletal  (+) Arthritis ,   Abdominal   Peds  Hematology negative hematology ROS (+)   Anesthesia Other Findings   Reproductive/Obstetrics                           Anesthesia Physical Anesthesia Plan  ASA: 3  Anesthesia Plan: General   Post-op Pain Management: Tylenol PO (pre-op)*   Induction: Intravenous  PONV Risk Score and Plan: 2 and Ondansetron, Dexamethasone and Midazolam  Airway Management Planned: LMA  Additional Equipment:   Intra-op Plan:   Post-operative Plan: Extubation in OR  Informed Consent: I have reviewed the patients History and Physical, chart, labs and discussed the procedure including the risks, benefits and alternatives for the proposed anesthesia with the patient or authorized representative who has indicated his/her understanding and acceptance.     Dental advisory given  Plan Discussed with: CRNA  Anesthesia Plan Comments:         Anesthesia Quick Evaluation  

## 2017-10-31 NOTE — Anesthesia Procedure Notes (Signed)
Procedure Name: LMA Insertion Date/Time: 10/31/2017 9:48 AM Performed by: Carney Living, CRNA Pre-anesthesia Checklist: Patient identified, Emergency Drugs available, Suction available, Patient being monitored and Timeout performed Patient Re-evaluated:Patient Re-evaluated prior to induction Oxygen Delivery Method: Circle system utilized Preoxygenation: Pre-oxygenation with 100% oxygen Induction Type: IV induction LMA: LMA inserted LMA Size: 5.0 Number of attempts: 1 Placement Confirmation: positive ETCO2 and breath sounds checked- equal and bilateral Tube secured with: Tape Dental Injury: Teeth and Oropharynx as per pre-operative assessment

## 2017-10-31 NOTE — Anesthesia Preprocedure Evaluation (Signed)
Anesthesia Evaluation  Patient identified by MRN, date of birth, ID band Patient awake    Reviewed: Allergy & Precautions, H&P , NPO status , Patient's Chart, lab work & pertinent test results  Airway Mallampati: II  TM Distance: >3 FB Neck ROM: Full    Dental no notable dental hx. (+) Edentulous Upper, Dental Advisory Given   Pulmonary neg pulmonary ROS, former smoker,    Pulmonary exam normal breath sounds clear to auscultation       Cardiovascular + Peripheral Vascular Disease and +CHF  negative cardio ROS   Rhythm:Regular Rate:Normal     Neuro/Psych Anxiety Depression negative neurological ROS     GI/Hepatic negative GI ROS, Neg liver ROS,   Endo/Other  diabetes, Insulin Dependent  Renal/GU ESRF and DialysisRenal disease  negative genitourinary   Musculoskeletal   Abdominal   Peds  Hematology  (+) anemia ,   Anesthesia Other Findings   Reproductive/Obstetrics negative OB ROS                             Anesthesia Physical Anesthesia Plan  ASA: III  Anesthesia Plan: General   Post-op Pain Management:    Induction: Intravenous  PONV Risk Score and Plan: 3 and Ondansetron, Dexamethasone and Midazolam  Airway Management Planned: LMA  Additional Equipment:   Intra-op Plan:   Post-operative Plan: Extubation in OR  Informed Consent: I have reviewed the patients History and Physical, chart, labs and discussed the procedure including the risks, benefits and alternatives for the proposed anesthesia with the patient or authorized representative who has indicated his/her understanding and acceptance.   Dental advisory given  Plan Discussed with: CRNA  Anesthesia Plan Comments:         Anesthesia Quick Evaluation

## 2017-10-31 NOTE — Transfer of Care (Signed)
Immediate Anesthesia Transfer of Care Note  Patient: Brendan Campbell  Procedure(s) Performed: INSERTION OF ARTERIOVENOUS (AV) GORE-TEX 4-35mm STETCH GRAFT LEFT ARM (Left )  Patient Location: PACU  Anesthesia Type:General  Level of Consciousness: awake, alert , oriented and patient cooperative  Airway & Oxygen Therapy: Patient Spontanous Breathing and Patient connected to nasal cannula oxygen  Post-op Assessment: Report given to RN, Post -op Vital signs reviewed and stable and Patient moving all extremities X 4  Post vital signs: Reviewed and stable  Last Vitals:  Vitals Value Taken Time  BP 142/91 10/31/2017 11:04 AM  Temp    Pulse 81 10/31/2017 11:05 AM  Resp 16 10/31/2017 11:05 AM  SpO2 92 % 10/31/2017 11:05 AM  Vitals shown include unvalidated device data.  Last Pain:  Vitals:   10/31/17 0828  PainSc: 0-No pain      Patients Stated Pain Goal: 3 (39/67/28 [id number])  Complications: No apparent anesthesia complications

## 2017-10-31 NOTE — Anesthesia Preprocedure Evaluation (Deleted)
Anesthesia Evaluation  Patient identified by MRN, date of birth, ID band Patient awake    Reviewed: Allergy & Precautions, H&P , NPO status , Patient's Chart, lab work & pertinent test results  Airway Mallampati: II  TM Distance: >3 FB Neck ROM: Full    Dental no notable dental hx. (+) Edentulous Upper, Dental Advisory Given   Pulmonary neg pulmonary ROS, former smoker,    Pulmonary exam normal breath sounds clear to auscultation       Cardiovascular + Peripheral Vascular Disease and +CHF   Rhythm:Regular Rate:Normal     Neuro/Psych Anxiety Depression negative neurological ROS     GI/Hepatic negative GI ROS, Neg liver ROS,   Endo/Other  diabetes, Type 1, Insulin Dependent  Renal/GU ESRF and DialysisRenal disease  negative genitourinary   Musculoskeletal  (+) Arthritis , Osteoarthritis,    Abdominal   Peds  Hematology negative hematology ROS (+) anemia ,   Anesthesia Other Findings   Reproductive/Obstetrics negative OB ROS                            Anesthesia Physical Anesthesia Plan  ASA: III  Anesthesia Plan: General   Post-op Pain Management:    Induction: Intravenous  PONV Risk Score and Plan: 3 and Ondansetron, Midazolam and Dexamethasone  Airway Management Planned: LMA  Additional Equipment:   Intra-op Plan:   Post-operative Plan: Extubation in OR  Informed Consent: I have reviewed the patients History and Physical, chart, labs and discussed the procedure including the risks, benefits and alternatives for the proposed anesthesia with the patient or authorized representative who has indicated his/her understanding and acceptance.   Dental advisory given  Plan Discussed with: CRNA, Anesthesiologist and Surgeon  Anesthesia Plan Comments:        Anesthesia Quick Evaluation

## 2017-11-01 ENCOUNTER — Telehealth: Payer: Self-pay | Admitting: Vascular Surgery

## 2017-11-01 ENCOUNTER — Encounter (HOSPITAL_COMMUNITY): Payer: Self-pay | Admitting: Vascular Surgery

## 2017-11-01 NOTE — Telephone Encounter (Signed)
sch appt phone NA mld ltr 11/24/17 315pm p/o MD

## 2017-11-24 ENCOUNTER — Encounter: Payer: Self-pay | Admitting: Vascular Surgery

## 2017-11-24 ENCOUNTER — Encounter: Payer: Medicaid Other | Admitting: Vascular Surgery

## 2018-10-05 ENCOUNTER — Emergency Department (HOSPITAL_COMMUNITY): Payer: Medicaid Other

## 2018-10-05 ENCOUNTER — Other Ambulatory Visit: Payer: Self-pay

## 2018-10-05 ENCOUNTER — Inpatient Hospital Stay (HOSPITAL_COMMUNITY)
Admission: EM | Admit: 2018-10-05 | Discharge: 2018-10-09 | DRG: 981 | Disposition: A | Discharge status: Home / Self Care | Payer: Medicaid Other | Source: Ambulatory Visit | Attending: Internal Medicine | Admitting: Internal Medicine

## 2018-10-05 ENCOUNTER — Encounter (HOSPITAL_COMMUNITY): Payer: Self-pay | Admitting: Student

## 2018-10-05 DIAGNOSIS — I447 Left bundle-branch block, unspecified: Secondary | ICD-10-CM | POA: Diagnosis present

## 2018-10-05 DIAGNOSIS — Z87891 Personal history of nicotine dependence: Secondary | ICD-10-CM

## 2018-10-05 DIAGNOSIS — Z89511 Acquired absence of right leg below knee: Secondary | ICD-10-CM

## 2018-10-05 DIAGNOSIS — Z8249 Family history of ischemic heart disease and other diseases of the circulatory system: Secondary | ICD-10-CM | POA: Diagnosis not present

## 2018-10-05 DIAGNOSIS — N179 Acute kidney failure, unspecified: Secondary | ICD-10-CM | POA: Diagnosis present

## 2018-10-05 DIAGNOSIS — Z8679 Personal history of other diseases of the circulatory system: Secondary | ICD-10-CM

## 2018-10-05 DIAGNOSIS — E119 Type 2 diabetes mellitus without complications: Secondary | ICD-10-CM

## 2018-10-05 DIAGNOSIS — L97509 Non-pressure chronic ulcer of other part of unspecified foot with unspecified severity: Secondary | ICD-10-CM | POA: Diagnosis not present

## 2018-10-05 DIAGNOSIS — K047 Periapical abscess without sinus: Secondary | ICD-10-CM | POA: Diagnosis present

## 2018-10-05 DIAGNOSIS — Y832 Surgical operation with anastomosis, bypass or graft as the cause of abnormal reaction of the patient, or of later complication, without mention of misadventure at the time of the procedure: Secondary | ICD-10-CM | POA: Diagnosis present

## 2018-10-05 DIAGNOSIS — E11621 Type 2 diabetes mellitus with foot ulcer: Secondary | ICD-10-CM

## 2018-10-05 DIAGNOSIS — D631 Anemia in chronic kidney disease: Secondary | ICD-10-CM | POA: Diagnosis present

## 2018-10-05 DIAGNOSIS — Z794 Long term (current) use of insulin: Secondary | ICD-10-CM

## 2018-10-05 DIAGNOSIS — R9431 Abnormal electrocardiogram [ECG] [EKG]: Secondary | ICD-10-CM | POA: Diagnosis present

## 2018-10-05 DIAGNOSIS — E1122 Type 2 diabetes mellitus with diabetic chronic kidney disease: Secondary | ICD-10-CM | POA: Diagnosis present

## 2018-10-05 DIAGNOSIS — R531 Weakness: Secondary | ICD-10-CM

## 2018-10-05 DIAGNOSIS — Z79899 Other long term (current) drug therapy: Secondary | ICD-10-CM | POA: Diagnosis not present

## 2018-10-05 DIAGNOSIS — T82858A Stenosis of vascular prosthetic devices, implants and grafts, initial encounter: Secondary | ICD-10-CM | POA: Diagnosis present

## 2018-10-05 DIAGNOSIS — T82898A Other specified complication of vascular prosthetic devices, implants and grafts, initial encounter: Secondary | ICD-10-CM | POA: Diagnosis not present

## 2018-10-05 DIAGNOSIS — Y712 Prosthetic and other implants, materials and accessory cardiovascular devices associated with adverse incidents: Secondary | ICD-10-CM | POA: Diagnosis present

## 2018-10-05 DIAGNOSIS — Z9115 Patient's noncompliance with renal dialysis: Secondary | ICD-10-CM

## 2018-10-05 DIAGNOSIS — Z20828 Contact with and (suspected) exposure to other viral communicable diseases: Secondary | ICD-10-CM | POA: Diagnosis present

## 2018-10-05 DIAGNOSIS — Z992 Dependence on renal dialysis: Secondary | ICD-10-CM

## 2018-10-05 DIAGNOSIS — E11649 Type 2 diabetes mellitus with hypoglycemia without coma: Secondary | ICD-10-CM | POA: Diagnosis present

## 2018-10-05 DIAGNOSIS — Z833 Family history of diabetes mellitus: Secondary | ICD-10-CM

## 2018-10-05 DIAGNOSIS — Z7982 Long term (current) use of aspirin: Secondary | ICD-10-CM

## 2018-10-05 DIAGNOSIS — N2581 Secondary hyperparathyroidism of renal origin: Secondary | ICD-10-CM | POA: Diagnosis present

## 2018-10-05 DIAGNOSIS — E1151 Type 2 diabetes mellitus with diabetic peripheral angiopathy without gangrene: Secondary | ICD-10-CM | POA: Diagnosis present

## 2018-10-05 DIAGNOSIS — E875 Hyperkalemia: Principal | ICD-10-CM

## 2018-10-05 DIAGNOSIS — N186 End stage renal disease: Secondary | ICD-10-CM | POA: Diagnosis present

## 2018-10-05 LAB — COMPREHENSIVE METABOLIC PANEL
ALT: 38 U/L (ref 0–44)
ALT: 34 U/L (ref 0–44)
AST: 33 U/L (ref 15–41)
AST: 32 U/L (ref 15–41)
Albumin: 4 g/dL (ref 3.5–5.0)
Albumin: 3.6 g/dL (ref 3.5–5.0)
Alkaline Phosphatase: 112 U/L (ref 38–126)
Alkaline Phosphatase: 103 U/L (ref 38–126)
Anion gap: 17 — ABNORMAL HIGH (ref 5–15)
Anion gap: >20 — ABNORMAL HIGH (ref 5–15)
BUN: 79 mg/dL — ABNORMAL HIGH (ref 6–20)
BUN: 79 mg/dL — ABNORMAL HIGH (ref 6–20)
CO2: 21 mmol/L — ABNORMAL LOW (ref 22–32)
CO2: 19 mmol/L — ABNORMAL LOW (ref 22–32)
Calcium: 8.3 mg/dL — ABNORMAL LOW (ref 8.9–10.3)
Calcium: 8.4 mg/dL — ABNORMAL LOW (ref 8.9–10.3)
Chloride: 101 mmol/L (ref 98–111)
Chloride: 98 mmol/L (ref 98–111)
Creatinine, Ser: 10.73 mg/dL — ABNORMAL HIGH (ref 0.61–1.24)
Creatinine, Ser: 10.44 mg/dL — ABNORMAL HIGH (ref 0.61–1.24)
GFR calc Af Amer: 6 mL/min — ABNORMAL LOW (ref >60)
GFR calc Af Amer: 6 mL/min — ABNORMAL LOW (ref >60)
GFR calc non Af Amer: 5 mL/min — ABNORMAL LOW (ref >60)
GFR calc non Af Amer: 5 mL/min — ABNORMAL LOW (ref >60)
Glucose, Bld: 40 mg/dL — CL (ref 70–99)
Glucose, Bld: 156 mg/dL — ABNORMAL HIGH (ref 70–99)
Potassium: 7.2 mmol/L (ref 3.5–5.1)
Potassium: 6.4 mmol/L (ref 3.5–5.1)
Sodium: 139 mmol/L (ref 135–145)
Sodium: 138 mmol/L (ref 135–145)
Total Bilirubin: 0.9 mg/dL (ref 0.3–1.2)
Total Bilirubin: 0.7 mg/dL (ref 0.3–1.2)
Total Protein: 7.8 g/dL (ref 6.5–8.1)
Total Protein: 7 g/dL (ref 6.5–8.1)

## 2018-10-05 LAB — CBG MONITORING, ED
Glucose-Capillary: 66 mg/dL — ABNORMAL LOW (ref 70–99)
Glucose-Capillary: 143 mg/dL — ABNORMAL HIGH (ref 70–99)
Glucose-Capillary: 29 mg/dL — CL (ref 70–99)
Glucose-Capillary: 188 mg/dL — ABNORMAL HIGH (ref 70–99)
Glucose-Capillary: 150 mg/dL — ABNORMAL HIGH (ref 70–99)

## 2018-10-05 LAB — CBC
HCT: 32.7 % — ABNORMAL LOW (ref 39.0–52.0)
Hemoglobin: 10.1 g/dL — ABNORMAL LOW (ref 13.0–17.0)
MCH: 33.9 pg (ref 26.0–34.0)
MCHC: 30.9 g/dL (ref 30.0–36.0)
MCV: 109.7 fL — ABNORMAL HIGH (ref 80.0–100.0)
Platelets: 197 10*3/uL (ref 150–400)
RBC: 2.98 MIL/uL — ABNORMAL LOW (ref 4.22–5.81)
RDW: 12.9 % (ref 11.5–15.5)
WBC: 9.3 10*3/uL (ref 4.0–10.5)
nRBC: 0 % (ref 0.0–0.2)

## 2018-10-05 LAB — SARS CORONAVIRUS 2 BY RT PCR (HOSPITAL ORDER, PERFORMED IN ~~LOC~~ HOSPITAL LAB): SARS Coronavirus 2: NEGATIVE

## 2018-10-05 LAB — LIPASE, BLOOD: Lipase: 34 U/L (ref 11–51)

## 2018-10-05 LAB — MRSA PCR SCREENING: MRSA by PCR: NEGATIVE

## 2018-10-05 IMAGING — CT CT HEAD WITHOUT CONTRAST
3 series · 15 of 47 positions shown, 18 images · non-contrast
Comparison: None.

CLINICAL DATA: Paresthesia.

EXAM:
CT HEAD WITHOUT CONTRAST
TECHNIQUE: Contiguous axial images were obtained from the base of the skull
through the vertex without intravenous contrast.

[Series 2: head w o · axial · 0.47mm/px · z∈[+104,+244]mm · 9 of 34 slices shown, 12 images]
[im 3/34  brain]
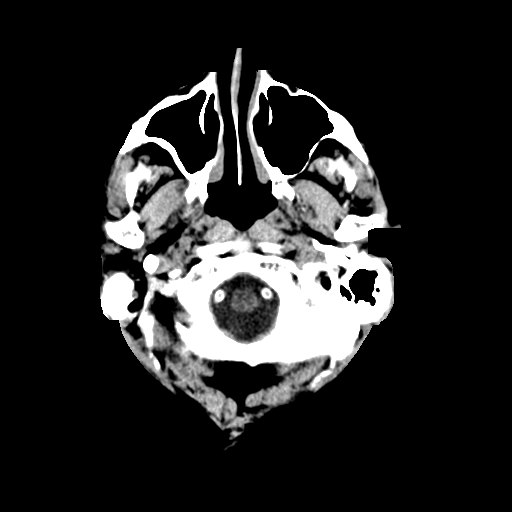
[im 3/34  bone]
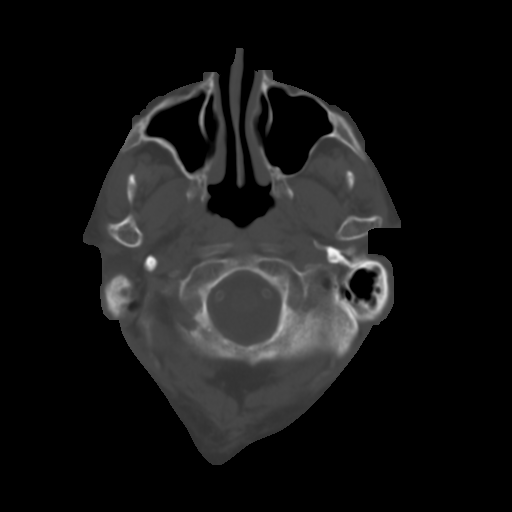
[im 6/34  brain]
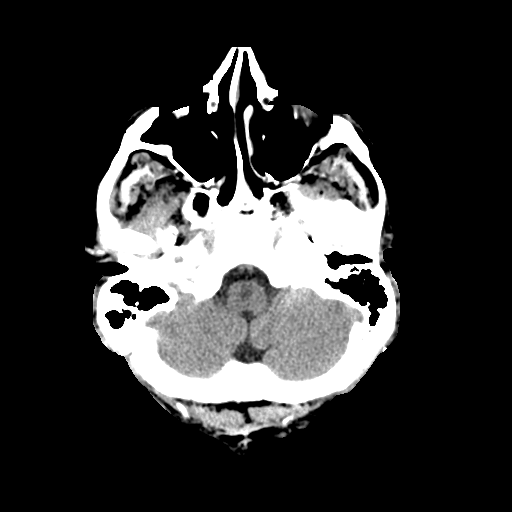
[im 10/34  brain]
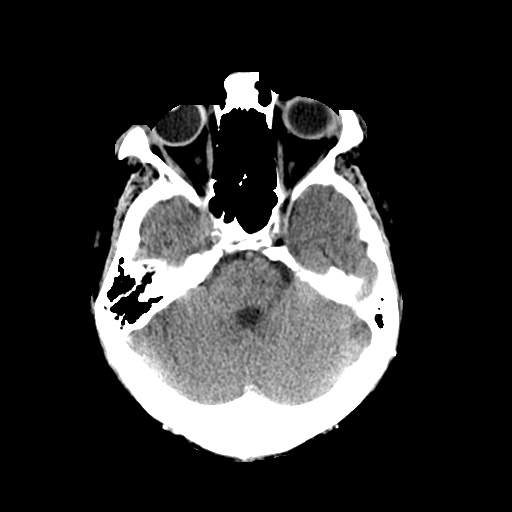
[im 13/34  brain]
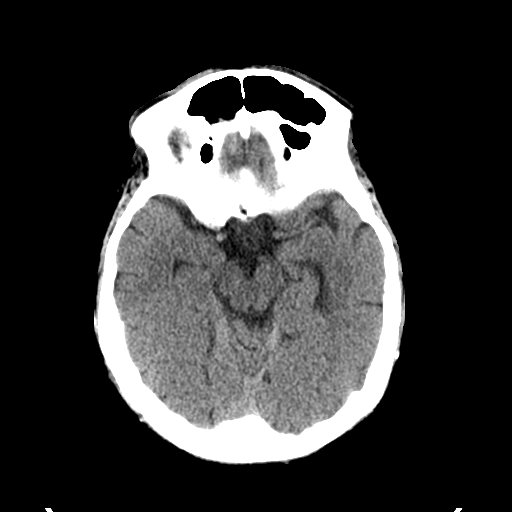
[im 18/34  brain]
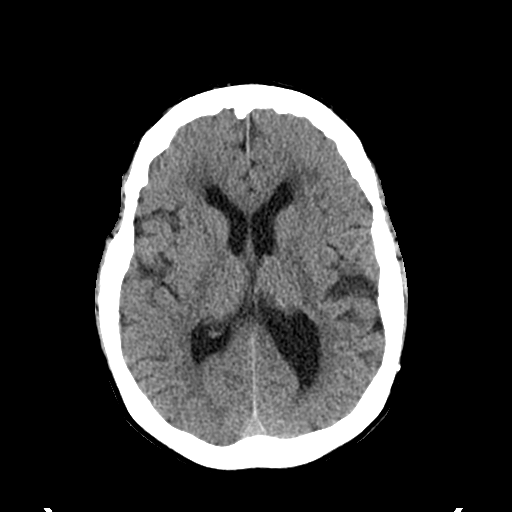
[im 18/34  bone]
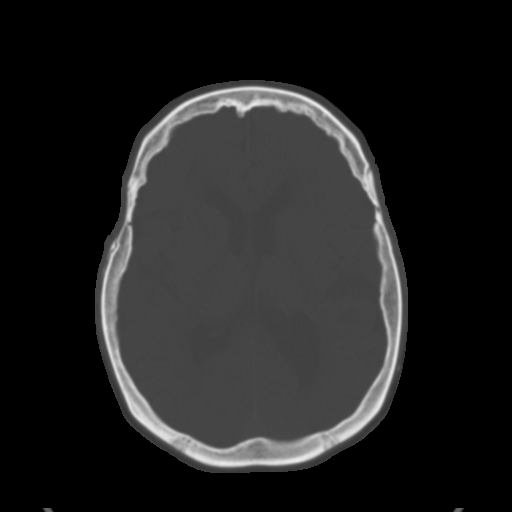
[im 21/34  brain]
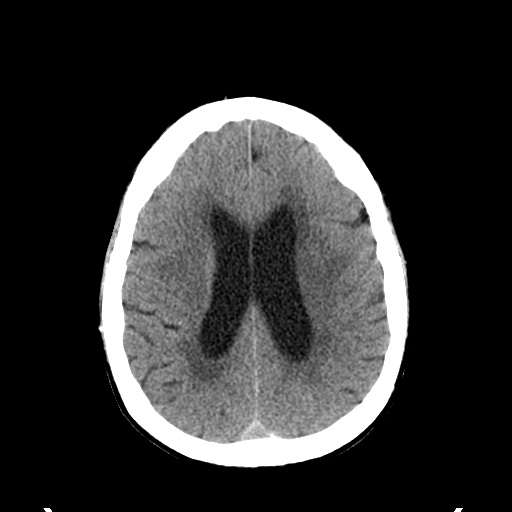
[im 24/34  brain]
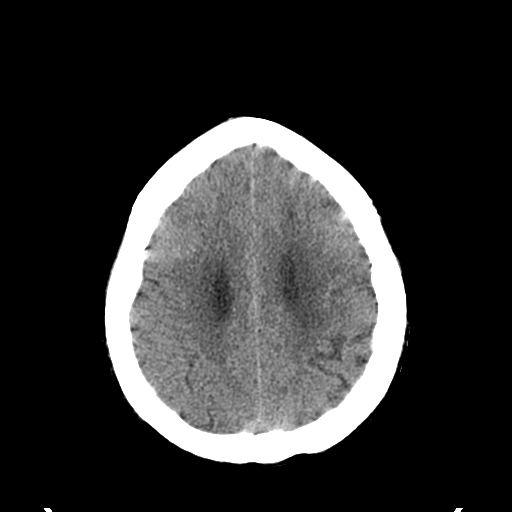
[im 28/34  brain]
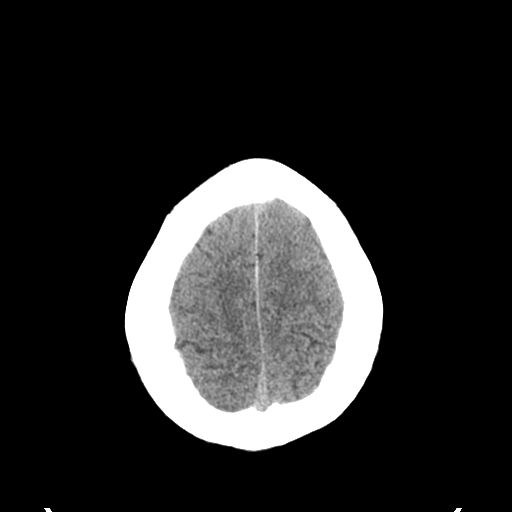
[im 31/34  brain]
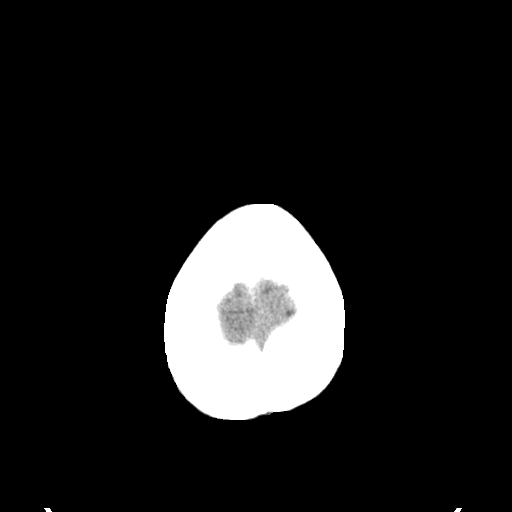
[im 31/34  bone]
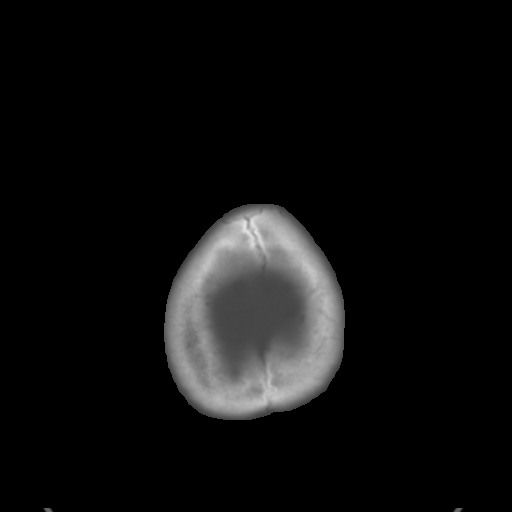

[Series 4: coronal soft · coronal · 0.31mm/px · 3 of 74 slices shown]
[im 25/74  brain]
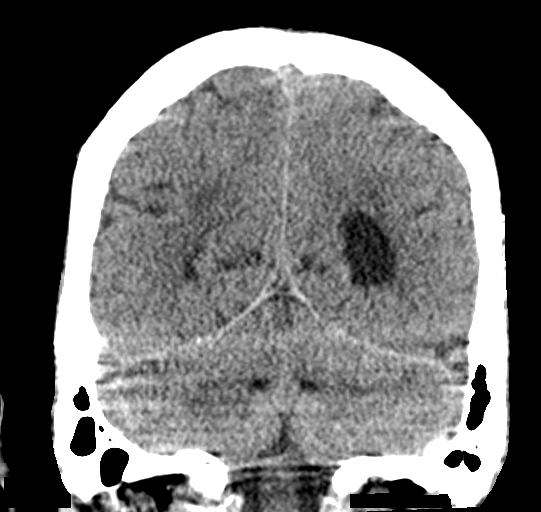
[im 33/74  brain]
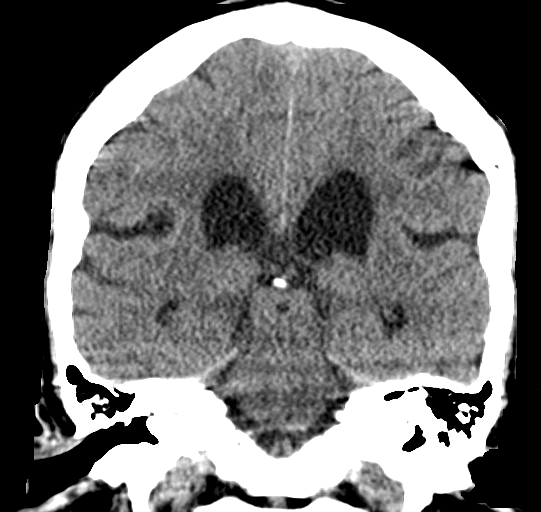
[im 41/74  brain]
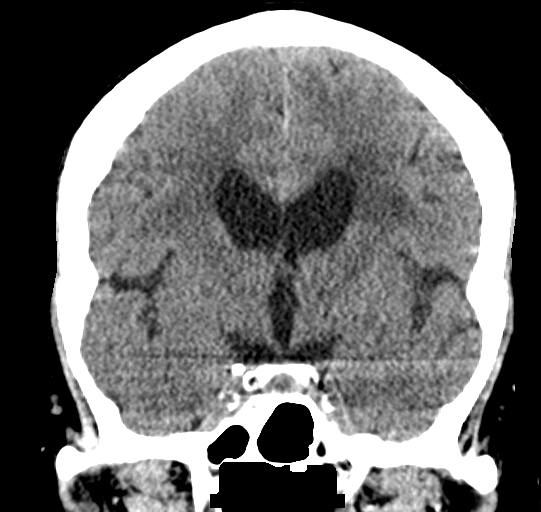

[Series 5: sagittal soft · sagittal · 0.31mm/px · 3 of 56 slices shown]
[im 19/56  brain]
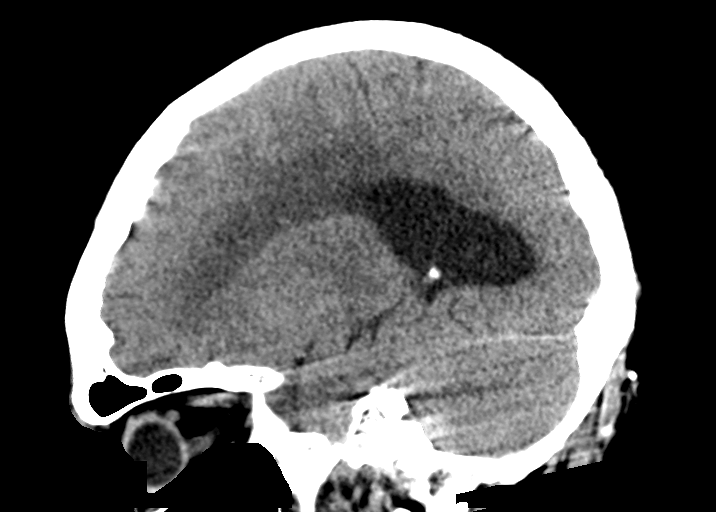
[im 28/56  brain]
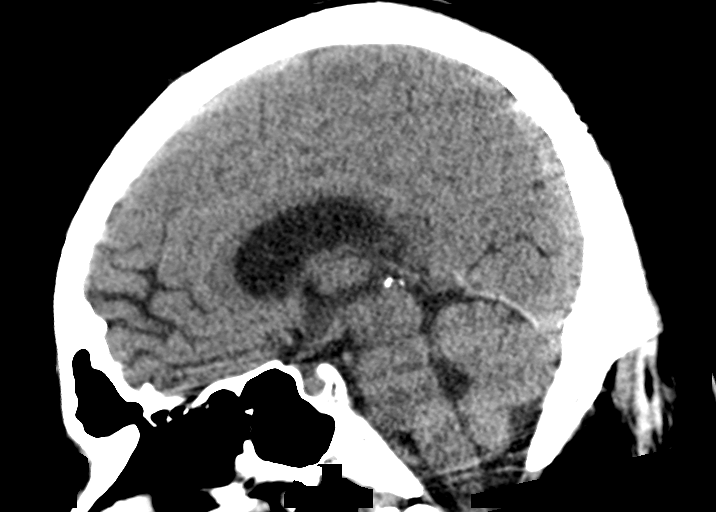
[im 37/56  brain]
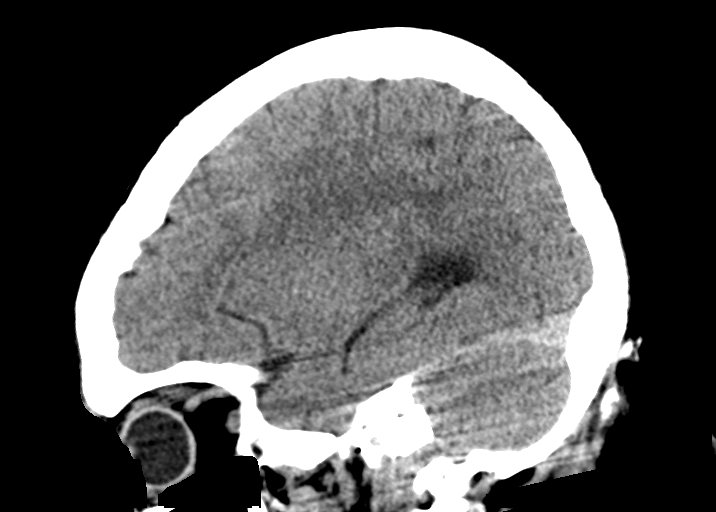

[15 of 47 positions shown; findings below may reference images not displayed]

FINDINGS: Brain: Mild chronic ischemic white matter disease is noted. No mass
effect or midline shift is noted. Ventricular size is within normal
limits. There is no evidence of mass lesion, hemorrhage or acute
infarction.

Vascular: No hyperdense vessel or unexpected calcification.

Skull: Normal. Negative for fracture or focal lesion.

Sinuses/Orbits: No acute finding.

Other: None.
IMPRESSION: Mild chronic ischemic white matter disease. No acute intracranial
abnormality seen.

## 2018-10-05 IMAGING — CR PORTABLE CHEST - 1 VIEW
1 series · 1 of 1 positions shown · non-contrast
Comparison: [DATE]

CLINICAL DATA: Shortness of breath.  The patient missed dialysis.

EXAM:
PORTABLE CHEST 1 VIEW

[portable]
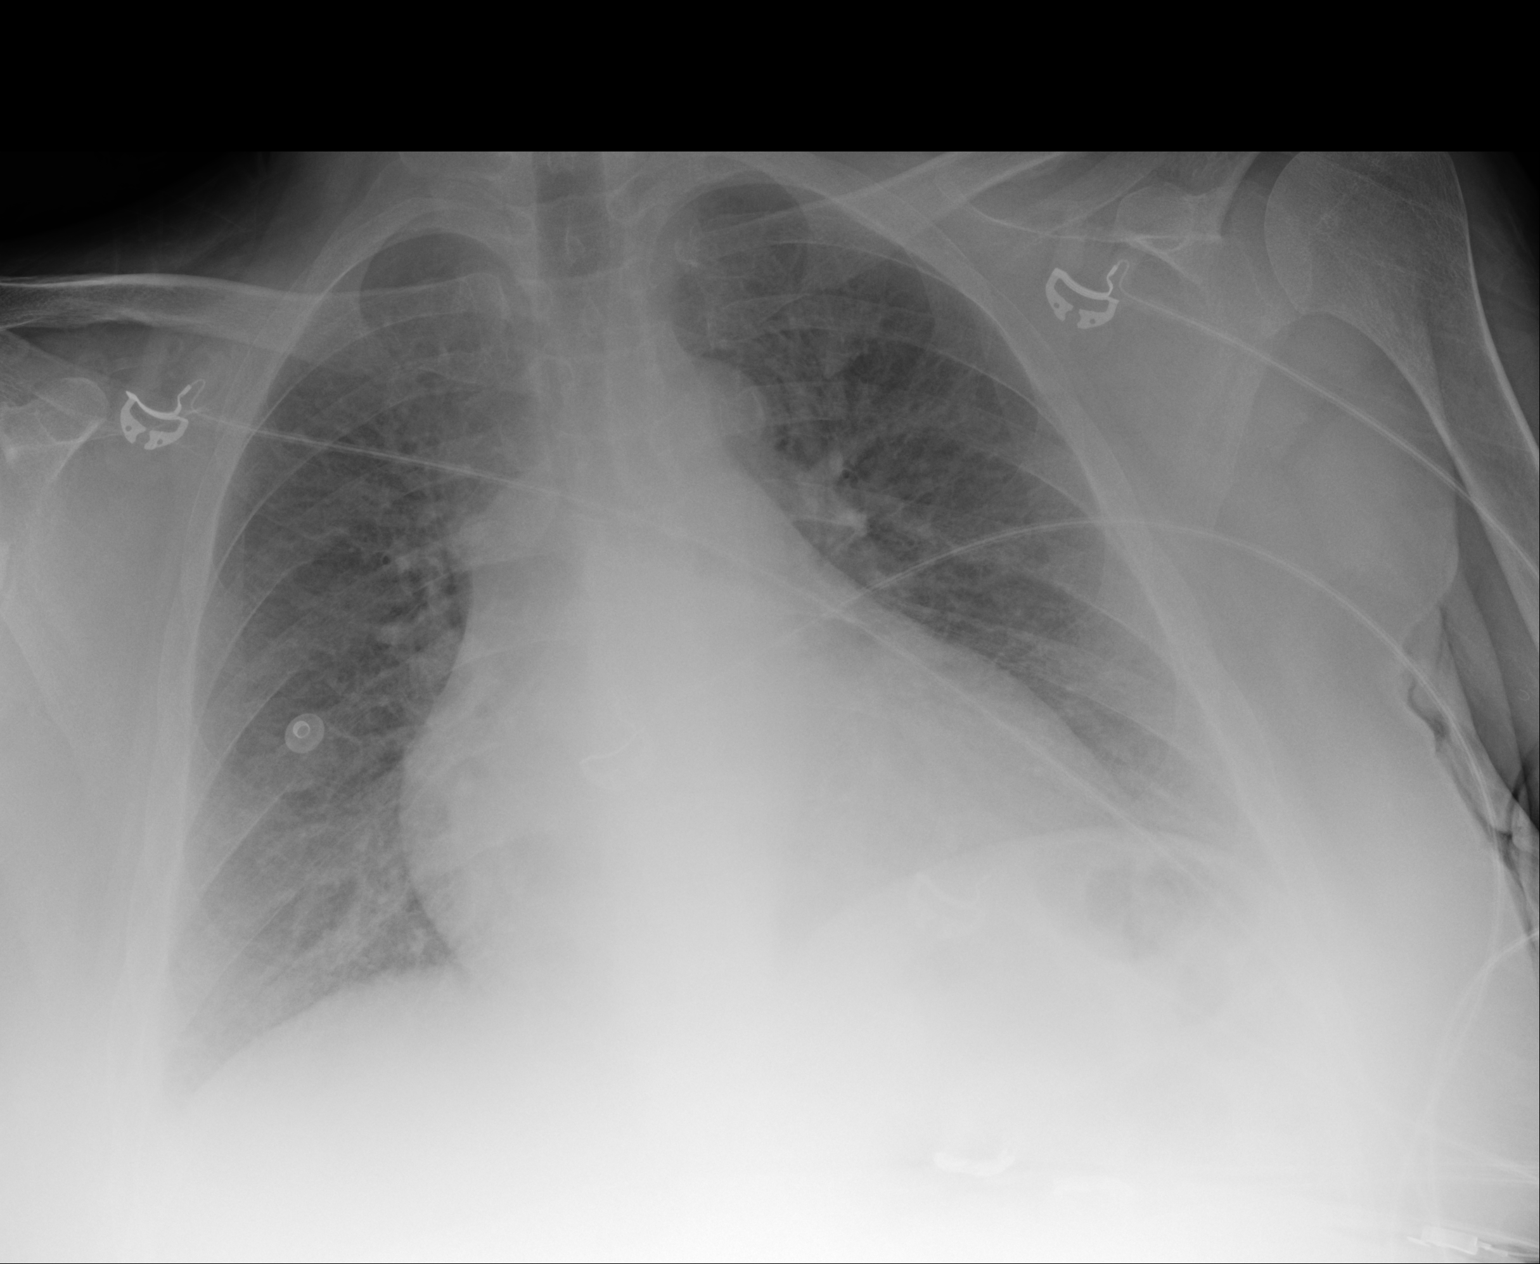

[1 of 1 positions shown; findings below may reference images not displayed]

FINDINGS: Stable cardiomegaly. The hila and mediastinum are unchanged. No
pneumothorax. No nodules or masses. No focal infiltrates. No overt
edema. Mild pulmonary venous congestion suspected.
IMPRESSION: Cardiomegaly and mild pulmonary venous congestion.  No overt edema.

## 2018-10-05 MED ORDER — ONDANSETRON HCL 4 MG PO TABS: 4.0000 mg | ORAL_TABLET | Freq: Four times a day (QID) | ORAL | Status: DC | PRN

## 2018-10-05 MED ORDER — DEXTROSE 50 % IV SOLN
1.0000 | Freq: Once | INTRAVENOUS | Status: AC
  Administered 2018-10-05: 13:00:00 50 mL via INTRAVENOUS

## 2018-10-05 MED ORDER — DEXTROSE 50 % IV SOLN
INTRAVENOUS | Status: AC
  Filled 2018-10-05: qty 100

## 2018-10-05 MED ORDER — CALCIUM GLUCONATE-NACL 1-0.675 GM/50ML-% IV SOLN
1.0000 g | Freq: Once | INTRAVENOUS | Status: AC
  Administered 2018-10-05: 1000 mg via INTRAVENOUS
  Filled 2018-10-05: qty 50

## 2018-10-05 MED ORDER — ONDANSETRON HCL 4 MG/2ML IJ SOLN: 4.0000 mg | Freq: Four times a day (QID) | INTRAMUSCULAR | Status: DC | PRN

## 2018-10-05 MED ORDER — SODIUM CHLORIDE 0.9 % IV SOLN: 1.0000 g | Freq: Once | INTRAVENOUS | Status: DC

## 2018-10-05 MED ORDER — CHLORHEXIDINE GLUCONATE CLOTH 2 % EX PADS
6.0000 | MEDICATED_PAD | Freq: Every day | CUTANEOUS | Status: DC
  Administered 2018-10-06 – 2018-10-07 (×2): 6 via TOPICAL

## 2018-10-05 MED ORDER — LIDOCAINE HCL (PF) 1 % IJ SOLN
INTRAMUSCULAR | Status: AC
  Filled 2018-10-05: qty 5

## 2018-10-05 MED ORDER — SODIUM BICARBONATE 8.4 % IV SOLN
INTRAVENOUS | Status: AC
  Administered 2018-10-05: 50 meq via INTRAVENOUS
  Filled 2018-10-05: qty 50

## 2018-10-05 MED ORDER — POLYETHYLENE GLYCOL 3350 17 G PO PACK: 17.0000 g | PACK | Freq: Every day | ORAL | Status: DC | PRN

## 2018-10-05 MED ORDER — DEXTROSE 50 % IV SOLN
INTRAVENOUS | Status: AC
  Filled 2018-10-05: qty 50

## 2018-10-05 MED ORDER — INSULIN ASPART 100 UNIT/ML IV SOLN
10.0000 [IU] | Freq: Once | INTRAVENOUS | Status: AC
  Administered 2018-10-05: 10 [IU] via INTRAVENOUS

## 2018-10-05 MED ORDER — LIDOCAINE HCL (PF) 1 % IJ SOLN
5.0000 mL | INTRAMUSCULAR | Status: DC | PRN
  Filled 2018-10-05: qty 5

## 2018-10-05 MED ORDER — SODIUM CHLORIDE 0.9 % IV SOLN: 100.0000 mL | INTRAVENOUS | Status: DC | PRN

## 2018-10-05 MED ORDER — PENTAFLUOROPROP-TETRAFLUOROETH EX AERO: 1.0000 "application " | INHALATION_SPRAY | CUTANEOUS | Status: DC | PRN

## 2018-10-05 MED ORDER — DEXTROSE 50 % IV SOLN
1.0000 | Freq: Once | INTRAVENOUS | Status: AC
  Administered 2018-10-05: 50 mL via INTRAVENOUS

## 2018-10-05 MED ORDER — HEPARIN SODIUM (PORCINE) 5000 UNIT/ML IJ SOLN
5000.0000 [IU] | Freq: Three times a day (TID) | INTRAMUSCULAR | Status: DC
  Administered 2018-10-06 – 2018-10-09 (×11): 5000 [IU] via SUBCUTANEOUS
  Filled 2018-10-05 (×11): qty 1

## 2018-10-05 MED ORDER — INSULIN ASPART 100 UNIT/ML ~~LOC~~ SOLN: 0.0000 [IU] | Freq: Every day | SUBCUTANEOUS | Status: DC

## 2018-10-05 MED ORDER — CALCIUM GLUCONATE-NACL 2-0.675 GM/100ML-% IV SOLN: 2.0000 g | Freq: Once | INTRAVENOUS | Status: DC

## 2018-10-05 MED ORDER — ALBUTEROL SULFATE (2.5 MG/3ML) 0.083% IN NEBU
10.0000 mg | INHALATION_SOLUTION | Freq: Once | RESPIRATORY_TRACT | Status: AC
  Administered 2018-10-05: 10 mg via RESPIRATORY_TRACT
  Filled 2018-10-05: qty 12

## 2018-10-05 MED ORDER — INSULIN ASPART 100 UNIT/ML ~~LOC~~ SOLN: 0.0000 [IU] | Freq: Three times a day (TID) | SUBCUTANEOUS | Status: DC

## 2018-10-05 MED ORDER — DEXTROSE-NACL 5-0.45 % IV SOLN
INTRAVENOUS | Status: DC
  Administered 2018-10-05 – 2018-10-06 (×2): via INTRAVENOUS

## 2018-10-05 MED ORDER — LORAZEPAM 2 MG/ML IJ SOLN
1.0000 mg | Freq: Once | INTRAMUSCULAR | Status: AC
  Administered 2018-10-05: 21:00:00 1 mg via INTRAVENOUS

## 2018-10-05 MED ORDER — RENA-VITE PO TABS
1.0000 | ORAL_TABLET | Freq: Every day | ORAL | Status: DC
  Administered 2018-10-06 – 2018-10-09 (×4): 1 via ORAL
  Filled 2018-10-05 (×4): qty 1

## 2018-10-05 MED ORDER — SODIUM BICARBONATE 8.4 % IV SOLN
50.0000 meq | Freq: Once | INTRAVENOUS | Status: AC
  Administered 2018-10-05: 17:00:00 50 meq via INTRAVENOUS

## 2018-10-05 MED ORDER — LORAZEPAM 2 MG/ML IJ SOLN
INTRAMUSCULAR | Status: AC
  Administered 2018-10-05: 1 mg via INTRAVENOUS
  Filled 2018-10-05: qty 1

## 2018-10-05 MED ORDER — LIDOCAINE-PRILOCAINE 2.5-2.5 % EX CREA
1.0000 "application " | TOPICAL_CREAM | CUTANEOUS | Status: DC | PRN
  Filled 2018-10-05: qty 5

## 2018-10-05 MED ORDER — INSULIN GLARGINE 100 UNIT/ML ~~LOC~~ SOLN
10.0000 [IU] | Freq: Every day | SUBCUTANEOUS | Status: DC
  Filled 2018-10-05 (×4): qty 0.1

## 2018-10-05 NOTE — H&P (Signed)
History and Physical  Brendan Campbell ZOX:[id number] DOB: June 02, 1970 DOA: 10/05/2018  Referring physician: Dr Sabra Heck, ED physician PCP: System, Pcp Not In  Outpatient Specialists:   Patient Coming From: home (from dialysis)  Chief Complaint: weakness, hyperkalemia  HPI: Brendan Campbell is a 48 y.o. male with a history of end-stage renal disease on Monday Wednesday Friday dialysis, diabetes type 2, CHF, anemia of chronic renal failure, hyperlipidemia.  Patient is also status post right BKA.  Patient was sent to the hospital due to hyperkalemia at dialysis.  There, his potassium was reported as 8.  The patient reports feeling weak over the past 2 to 3 days especially with left arm weakness that is greater than right arm weakness.  He is also been noting difficulty ambulating with weakness in his left leg.  His symptoms are worsening.  He reports taking his medicines as prescribed.  He reportedly has not missed any dialysis.  Emergency Department Course: Patient had CMP drawn, but the lab did not report values as they were abnormal.  Potassium was verbally reported to EDP as 8.  Patient received insulin, D50, albuterol.  Nephrology was consulted and the patient will be having emergent dialysis.  CT of the head was negative.  Review of Systems:   Pt denies any fevers, chills, nausea, vomiting, diarrhea, constipation, abdominal pain, shortness of breath, dyspnea on exertion, orthopnea, cough, wheezing, palpitations, headache, vision changes, lightheadedness, dizziness, melena, rectal bleeding.  Review of systems are otherwise negative  Past Medical History:  Diagnosis Date  . Anemia   . Anxiety   . Blood transfusion without reported diagnosis   . CHF (congestive heart failure) ([place])   . Chronic kidney disease   . Depression   . Diabetes mellitus without complication ([place])   . End stage renal disease ([place])    M/W/F [facility]  . Hyperlipidemia   . Neuropathy   . Peripheral vascular disease  ([place])   . PTSD (post-traumatic stress disorder)   . Wears glasses    reading   Past Surgical History:  Procedure Laterality Date  . AV FISTULA PLACEMENT Left 09/22/2016   Procedure: CREATION OF LEFT ARM ARTERIOVENOUS (AV) FISTULA;  Surgeon: Waynetta Sandy, MD;  Location: [facility];  Service: Vascular;  Laterality: Left;  . AV FISTULA PLACEMENT Left 10/31/2017   Procedure: INSERTION OF ARTERIOVENOUS (AV) GORE-TEX 4-60m STETCH GRAFT LEFT ARM;  Surgeon: CWaynetta Sandy MD;  [place]  Service: Vascular;  Laterality: Left;  . BASCILIC VEIN TRANSPOSITION Left 08/17/2017   Procedure: SECOND STAGE BASILIC VEIN TRANSPOSITION LEFT ARM;  Surgeon: CWaynetta Sandy MD;  [place]  Service: Vascular;  Laterality: Left;  . BELOW KNEE LEG AMPUTATION Right   . CHOLECYSTECTOMY    . FOOT SURGERY    . IR FLUORO GUIDE CV LINE RIGHT  05/16/2016  . IR REMOVAL TUN CV CATH W/O FL  05/16/2016  . IR [place] ACCESS RIGHT  05/16/2016   Social History:  reports that he quit smoking about 12 years ago. He has never used smokeless tobacco. He reports that he does not drink alcohol or use drugs. Patient lives at home  Allergies  Allergen Reactions  . Tape Other (See Comments)    Pulls skin off    Family History  Problem Relation Age of Onset  . Cancer Mother        lung  . Diabetes Mother   . Heart attack Father   . Diabetes Father   .  Diabetes Sister       Prior to Admission medications   Medication Sig Start Date End Date Taking? Authorizing Provider  aspirin 81 MG chewable tablet Chew 81 mg by mouth daily.   Yes [provider]  blood glucose meter kit and supplies KIT Dispense based on patient and insurance preference. Use up to four times daily as directed. (FOR ICD-9 250.00, 250.01). 06/02/16  Yes Timmothy Euler, MD  Insulin Glargine (LANTUS SOLOSTAR) 100 UNIT/ML Solostar Pen Inject 10-15 Units into the skin daily at 10 pm. Patient taking  differently: Inject 10 Units into the skin daily at 10 pm.  07/21/16  Yes Timmothy Euler, MD  multivitamin (RENA-VIT) TABS tablet Take 1 tablet by mouth daily. 08/04/18  Yes [provider]  SURE COMFORT PEN NEEDLES 31G X 5 MM MISC USE AS DIRECTED DAILY. 11/07/16  Yes Timmothy Euler, MD  atorvastatin (LIPITOR) 10 MG tablet Take 1 tablet (10 mg total) by mouth every evening. Patient not taking: Reported on 10/05/2018 06/02/16   Timmothy Euler, MD  baclofen (LIORESAL) 10 MG tablet Take 1 tablet (10 mg total) by mouth every 8 (eight) hours as needed for muscle spasms. Patient not taking: Reported on 10/11/2017 03/08/16   Meredith Staggers, MD  oxyCODONE-acetaminophen (PERCOCET/ROXICET) 5-325 MG tablet Take 1 tablet by mouth every 6 (six) hours as needed. Patient not taking: Reported on 10/05/2018 10/31/17   Gabriel Earing, PA-C  polyethylene glycol powder (GLYCOLAX/MIRALAX) powder 1/2 to 2 capfuls daily for constipation. Patient not taking: Reported on 10/11/2017 06/02/16   Timmothy Euler, MD    Physical Exam: BP 121/71   Pulse 98   Temp (!) 97.5 F (36.4 C) (Oral)   Resp 19   Ht _0  (1.753 m)   Wt 104.3 kg   SpO2 97%   BMI 33.97 kg/m   . General: Middle-age male. Awake and alert and oriented x3. No acute cardiopulmonary distress.  [name] HEENT: Normocephalic atraumatic.  Right and left ears normal in appearance.  Pupils equal, round, reactive to light. Extraocular muscles are intact. Sclerae anicteric and noninjected.  Moist mucosal membranes. No mucosal lesions.  . Neck: Neck supple without lymphadenopathy. No carotid bruits. No masses palpated.  . Cardiovascular: Regular rate with normal S1-S2 sounds. No murmurs, rubs, gallops auscultated. No JVD.  [name] Respiratory: Good respiratory effort with no wheezes, rales, rhonchi. Lungs clear to auscultation bilaterally.  No accessory muscle use. . Abdomen: Soft, nontender, nondistended. Active bowel sounds. No masses or  hepatosplenomegaly  . Skin: No rashes, lesions, or ulcerations.  Dry, warm to touch. 2+ dorsalis pedis and radial pulses. . Musculoskeletal: Right BKA. No calf or leg pain. All major joints not erythematous nontender.  No upper or lower joint deformation.  Good ROM.  No contractures  . Psychiatric: Intact judgment and insight. Pleasant and cooperative. . Neurologic: No focal neurological deficits. Strength is slightly diminished, but symmetric in upper and lower extremities.  Cranial nerves II through XII are grossly intact.           Labs on Admission: I have personally reviewed following labs and imaging studies  CBC: Recent Labs  Lab 10/05/18 1449  WBC 9.3  HGB 10.1*  HCT 32.7*  MCV 109.7*  PLT 315   Basic Metabolic Panel: No results for input(s): NA, K, CL, CO2, GLUCOSE, BUN, CREATININE, CALCIUM, MG, PHOS in the last 168 hours. GFR: CrCl cannot be calculated (Patient's most recent lab result is older than the  maximum 21 days allowed.). Liver Function Tests: No results for input(s): AST, ALT, ALKPHOS, BILITOT, PROT, ALBUMIN in the last 168 hours. Recent Labs  Lab 10/05/18 1449  LIPASE 34   No results for input(s): AMMONIA in the last 168 hours. Coagulation Profile: No results for input(s): INR, PROTIME in the last 168 hours. Cardiac Enzymes: No results for input(s): CKTOTAL, CKMB, CKMBINDEX, TROPONINI in the last 168 hours. BNP (last 3 results) No results for input(s): PROBNP in the last 8760 hours. HbA1C: No results for input(s): HGBA1C in the last 72 hours. CBG: Recent Labs  Lab 10/05/18 1258 10/05/18 1359  GLUCAP 66* 143*   Lipid Profile: No results for input(s): CHOL, HDL, LDLCALC, TRIG, CHOLHDL, LDLDIRECT in the last 72 hours. Thyroid Function Tests: No results for input(s): TSH, T4TOTAL, FREET4, T3FREE, THYROIDAB in the last 72 hours. Anemia Panel: No results for input(s): VITAMINB12, FOLATE, FERRITIN, TIBC, IRON, RETICCTPCT in the last 72 hours. Urine  analysis: No results found for: COLORURINE, APPEARANCEUR, LABSPEC, PHURINE, GLUCOSEU, HGBUR, BILIRUBINUR, KETONESUR, PROTEINUR, UROBILINOGEN, NITRITE, LEUKOCYTESUR Sepsis Labs: _0 (procalcitonin:4,lacticidven:4) ) Recent Results (from the past 240 hour(s))  SARS Coronavirus 2 [facility] order, Performed in [facility] hospital lab) Nasopharyngeal Nasopharyngeal Swab     Status: None   Collection Time: 10/05/18  1:43 PM   Specimen: Nasopharyngeal Swab  Result Value Ref Range Status   SARS Coronavirus 2 NEGATIVE NEGATIVE Final    Comment: (NOTE) If result is NEGATIVE SARS-CoV-2 target nucleic acids are NOT DETECTED. The SARS-CoV-2 RNA is generally detectable in upper and lower  respiratory specimens during the acute phase of infection. The lowest  concentration of SARS-CoV-2 viral copies this assay can detect is 250  copies / mL. A negative result does not preclude SARS-CoV-2 infection  and should not be used as the sole basis for treatment or other  patient management decisions.  A negative result may occur with  improper specimen collection / handling, submission of specimen other  than nasopharyngeal swab, presence of viral mutation(s) within the  areas targeted by this assay, and inadequate number of viral copies  (<250 copies / mL). A negative result must be combined with clinical  observations, patient history, and epidemiological information. If result is POSITIVE SARS-CoV-2 target nucleic acids are DETECTED. The SARS-CoV-2 RNA is generally detectable in upper and lower  respiratory specimens dur ing the acute phase of infection.  Positive  results are indicative of active infection with SARS-CoV-2.  Clinical  correlation with patient history and other diagnostic information is  necessary to determine patient infection status.  Positive results do  not rule out bacterial infection or co-infection with other viruses. If result is PRESUMPTIVE POSTIVE SARS-CoV-2 nucleic  acids MAY BE PRESENT.   A presumptive positive result was obtained on the submitted specimen  and confirmed on repeat testing.  While 2019 novel coronavirus  (SARS-CoV-2) nucleic acids may be present in the submitted sample  additional confirmatory testing may be necessary for epidemiological  and / or clinical management purposes  to differentiate between  SARS-CoV-2 and other Sarbecovirus currently known to infect humans.  If clinically indicated additional testing with an alternate test  methodology [phone]) is advised. The SARS-CoV-2 RNA is generally  detectable in upper and lower respiratory sp ecimens during the acute  phase of infection. The expected result is Negative. Fact Sheet for Patients:  StrictlyIdeas.no Fact Sheet for Healthcare Providers: BankingDealers.co.za This test is not yet approved or cleared by the [place] FDA and has been authorized for  detection and/or diagnosis of SARS-CoV-2 by FDA under an Emergency Use Authorization (EUA).  This EUA will remain in effect (meaning this test can be used) for the duration of the COVID-19 declaration under Section 564(b)(1) of the Act, 21 U.S.C. section 360bbb-3(b)(1), unless the authorization is terminated or revoked sooner. Performed at [facility], [address], [place], [place] [address]      Radiological Exams on Admission: Ct Head Wo Contrast  Result Date: 10/05/2018 CLINICAL DATA:  Paresthesia. EXAM: CT HEAD WITHOUT CONTRAST TECHNIQUE: Contiguous axial images were obtained from the base of the skull through the vertex without intravenous contrast. COMPARISON:  None. FINDINGS: Brain: Mild chronic ischemic white matter disease is noted. No mass effect or midline shift is noted. Ventricular size is within normal limits. There is no evidence of mass lesion, hemorrhage or acute infarction. Vascular: No hyperdense vessel or unexpected calcification. Skull: Normal. Negative  for fracture or focal lesion. Sinuses/Orbits: No acute finding. Other: None. IMPRESSION: Mild chronic ischemic white matter disease. No acute intracranial abnormality seen. Electronically Signed   By: Marijo Conception M.D.   On: 10/05/2018 15:34   Dg Chest Portable 1 View  Result Date: 10/05/2018 CLINICAL DATA:  Shortness of breath.  The patient missed dialysis. EXAM: PORTABLE CHEST 1 VIEW COMPARISON:  April 24, 2018 FINDINGS: Stable cardiomegaly. The hila and mediastinum are unchanged. No pneumothorax. No nodules or masses. No focal infiltrates. No overt edema. Mild pulmonary venous congestion suspected. IMPRESSION: Cardiomegaly and mild pulmonary venous congestion.  No overt edema. Electronically Signed   By: Dorise Bullion III M.D   On: 10/05/2018 12:40    EKG: Independently reviewed.  Wide QRS complex initially.  Repeat EKG showed sinus rhythm with left bundle branch block.  Assessment/Plan: Principal Problem:   Hyperkalemia Active Problems:   DM2 (diabetes mellitus, type 2) (HCC)   ESRD on dialysis ([place])   Anemia due to end stage renal disease ([place])   Weakness    This patient was discussed with the ED physician, including pertinent vitals, physical exam findings, labs, and imaging.  We also discussed care given by the ED provider.  1. Hyperkalemia  a. Patient qualifies for full admission b. Emergent dialysis c. Stat BMP following dialysis d. BMP repeated tomorrow 2. Weakness a. No focal deficits seen on exam.  This appears to be more global and likely due to the hyperkalemia. b. No evidence of stroke on CT scan c. If patient continues to have weakness despite normalization of potassium, may warrant further imaging 3. Diabetes a. Continue Lantus as repeat CBG shows blood sugar of 188 b. Sliding scale insulin with CBGs before meals and nightly 4. End-stage renal disease a. Dialysis per nephrology  DVT prophylaxis: Heparin Consultants: Nephrology Code Status: Full code Family  Communication: None Disposition Plan: Patient to return home following admission   Truett Mainland, DO

## 2018-10-05 NOTE — Consult Note (Signed)
Patient:  Brendan Campbell  DOB:  [birth year]  MRN:  [id number]   Preop Diagnosis: End-stage renal disease, hyperkalemia, dysfunctional graft  Postop Diagnosis: Same  Procedure: Left femoral dialysis catheter insertion  Surgeon: Aviva Signs, MD  Anes: Local  Indications: Patient is a 48 year old white male who needs urgent dialysis access due to hyperkalemia and a graft that failed during dialysis.  The risks and benefits of the procedure were fully explained to the patient, who gave informed consent.  Procedure note: The patient had the procedure done at bedside in ICU #2.  Informed consent was obtained.  A timeout was performed.  Both groins were prepped and draped using usual sterile technique with ChloraPrep.  Surgical site confirmation was performed.  1% Xylocaine was used for local anesthesia.  A gown, mask, sterile drape, and sterile technique were all used.  I initially tried to access the right femoral vein.  I could not localize it, thus I went to the left femoral vein.  I was able to easily access this without difficulty.  A guidewire was then advanced without difficulty.  An introducer was placed over the guidewire.  The dual-lumen catheter was then inserted over the guidewire and the guidewire was removed.  Good backflow of venous blood was noted on aspiration of the ports.  Both ports were flushed with saline.  The patient was immediately connected to the dialysis unit and the flows were around 400 cc.  The catheter was secured in place using a 3-0 silk suture.  A dry sterile dressing was applied.   Complications: None  EBL: Minimal  Specimen: None

## 2018-10-05 NOTE — ED Provider Notes (Signed)
[facility] EMERGENCY DEPARTMENT Provider Note   CSN: [id number] Arrival date & time: 10/05/18  1159     History   Chief Complaint Chief Complaint  Patient presents with   Weakness    HPI Brendan Campbell is a 48 y.o. male with a hx of anemia, anxiety, CHF, T2DM, HTN, PTSD, & ESRD on dialysis MWF who presents to the ED via EMS from dialysis center with multiple complaints but was ultimately sent by dialysis center for hyperkalemia per EMS. Patient states he has been feeling generally poorly the last couple of days- he reports generalized weakness especially when he stands up, intermittent palpitations, dyspnea w/ exertion, & has had some generalized abdominal pain as well as several episodes of diarrhea. He states that he also feels that his left side is numb, he thinks this started when he woke from sleep at 1300 yesterday, but is not entirely sure. Other than standing no other alleviating/aggravating factors to his symptoms. He was last fully dialyzed Monday (10/01/18), missed dialysis Wednesday because he states he did not feel like going, & attempted to go to dialysis today. Per EMS to triage team patient's potassium was 8 therefore they sent him to the ED. Patient denies chest pain, headache, visual disturbance, dizziness like the room spinning, nausea, vomiting, or syncope.     HPI  Past Medical History:  Diagnosis Date   Anemia    Anxiety    Blood transfusion without reported diagnosis    CHF (congestive heart failure) ([place])    Chronic kidney disease    Depression    Diabetes mellitus without complication ([place])    End stage renal disease ([place])    M/W/F [facility]   Hyperlipidemia    Neuropathy    Peripheral vascular disease ([place])    PTSD (post-traumatic stress disorder)    Wears glasses    reading    Patient Active Problem List   Diagnosis Date Noted   Chest pain 05/10/2016   ESRD on dialysis ([place]) 05/10/2016   Anemia due to end stage renal disease  ([place]) 05/10/2016   Spondylosis of lumbar spine 03/08/2016   Lumbar radiculopathy 03/08/2016   S/P below knee amputation, right ([place]) 03/05/2015   Chronic renal insufficiency, stage IV (severe) ([place]) 03/04/2015   DM2 (diabetes mellitus, type 2) ([place]) 03/03/2015   HLD (hyperlipidemia) 03/03/2015   Back pain 03/03/2015    Past Surgical History:  Procedure Laterality Date   AV FISTULA PLACEMENT Left 09/22/2016   Procedure: CREATION OF LEFT ARM ARTERIOVENOUS (AV) FISTULA;  Surgeon: Waynetta Sandy, MD;  Location: [facility];  Service: Vascular;  Laterality: Left;   AV FISTULA PLACEMENT Left 10/31/2017   Procedure: INSERTION OF ARTERIOVENOUS (AV) GORE-TEX 4-61m STETCH GRAFT LEFT ARM;  Surgeon: CWaynetta Sandy MD;  [place] [facility]  Service: Vascular;  Laterality: Left;   BStandishLeft 08/17/2017   Procedure: SECOND STAGE BASILIC VEIN TRANSPOSITION LEFT ARM;  Surgeon: CWaynetta Sandy MD;  [place] [facility]  Service: Vascular;  Laterality: Left;   BELOW KNEE LEG AMPUTATION Right    CHOLECYSTECTOMY     FOOT SURGERY     IR FLUORO GUIDE CV LINE RIGHT  05/16/2016   IR REMOVAL TUN CV CATH W/O FL  05/16/2016   IR [place] VASC ACCESS RIGHT  05/16/2016        Home Medications    Prior to Admission medications   Medication Sig Start Date End Date Taking? Authorizing Provider  aspirin 81 MG  chewable tablet Chew 81 mg by mouth daily.    [provider]  atorvastatin (LIPITOR) 10 MG tablet Take 1 tablet (10 mg total) by mouth every evening. 06/02/16   Timmothy Euler, MD  baclofen (LIORESAL) 10 MG tablet Take 1 tablet (10 mg total) by mouth every 8 (eight) hours as needed for muscle spasms. Patient not taking: Reported on 10/11/2017 03/08/16   Meredith Staggers, MD  blood glucose meter kit and supplies KIT Dispense based on patient and insurance preference. Use up to four times daily as directed. (FOR ICD-9 250.00, 250.01). 06/02/16    Timmothy Euler, MD  Insulin Glargine (LANTUS SOLOSTAR) 100 UNIT/ML Solostar Pen Inject 10-15 Units into the skin daily at 10 pm. Patient taking differently: Inject 10 Units into the skin daily at 10 pm.  07/21/16   Timmothy Euler, MD  oxyCODONE-acetaminophen (PERCOCET/ROXICET) 5-325 MG tablet Take 1 tablet by mouth every 6 (six) hours as needed. 10/31/17   Rhyne, Hulen Shouts, PA-C  polyethylene glycol powder (GLYCOLAX/MIRALAX) powder 1/2 to 2 capfuls daily for constipation. Patient not taking: Reported on 10/11/2017 06/02/16   Timmothy Euler, MD  SURE COMFORT PEN NEEDLES 31G X 5 MM MISC USE AS DIRECTED DAILY. 11/07/16   Timmothy Euler, MD    Family History Family History  Problem Relation Age of Onset   Cancer Mother        lung   Diabetes Mother    Heart attack Father    Diabetes Father    Diabetes Sister     Social History Social History   Tobacco Use   Smoking status: Former Smoker    Quit date: 09/03/2006    Years since quitting: 12.0   Smokeless tobacco: Never Used  Substance Use Topics   Alcohol use: No   Drug use: No     Allergies   Tape   Review of Systems Review of Systems  Constitutional: Negative for chills and fever.  Respiratory: Positive for shortness of breath.   Cardiovascular: Positive for palpitations. Negative for chest pain.  Gastrointestinal: Positive for abdominal pain and diarrhea. Negative for anal bleeding, blood in stool, constipation, nausea and vomiting.  Neurological: Positive for weakness (generalized) and numbness (L sided). Negative for dizziness, syncope, speech difficulty and headaches.  All other systems reviewed and are negative.   Physical Exam Updated Vital Signs BP (!) 127/92 (BP Location: Right Arm)    Pulse 98    Temp (!) 97.5 F (36.4 C) (Oral)    Resp 12    Ht 5' 9"  (1.753 m)    Wt 104.3 kg    SpO2 97%    BMI 33.97 kg/m   Physical Exam Vitals signs and nursing note reviewed.  Constitutional:       Appearance: He is well-developed. He is not toxic-appearing.  HENT:     Head: Normocephalic and atraumatic.  Eyes:     General:        Right eye: No discharge.        Left eye: No discharge.     Extraocular Movements: Extraocular movements intact.     Conjunctiva/sclera: Conjunctivae normal.     Pupils: Pupils are equal, round, and reactive to light.  Neck:     Musculoskeletal: Neck supple.  Cardiovascular:     Rate and Rhythm: Normal rate and regular rhythm.  Pulmonary:     Effort: Pulmonary effort is normal. No respiratory distress.     Breath sounds: Normal breath sounds. No  wheezing, rhonchi or rales.  Abdominal:     General: There is no distension.     Palpations: Abdomen is soft.     Tenderness: There is generalized abdominal tenderness. There is no guarding or rebound.  Musculoskeletal:     Comments: R BKA LUE AV fistula in place w/ palpable thrill.   Skin:    General: Skin is warm and dry.     Findings: No rash.  Neurological:     Mental Status: He is alert.     Comments: Clear speech. CN III-XII grossly intact. Subjective decreased sensation to LUE/LLE per patient- able to identify sharp touch. 5/5 symmetric grip strength. Very minimal pronator drift to LUE noted. Able to lift each leg off of the stretcher & hold for 10 seconds. Intact finger to nose.   Psychiatric:        Behavior: Behavior normal.    ED Treatments / Results  Labs (all labs ordered are listed, but only abnormal results are displayed) Labs Reviewed  CBC - Abnormal; Notable for the following components:      Result Value   RBC 2.98 (*)    Hemoglobin 10.1 (*)    HCT 32.7 (*)    MCV 109.7 (*)    All other components within normal limits  COMPREHENSIVE METABOLIC PANEL - Abnormal; Notable for the following components:   Potassium 7.2 (*)    CO2 21 (*)    Glucose, Bld 40 (*)    BUN 79 (*)    Creatinine, Ser 10.73 (*)    Calcium 8.3 (*)    GFR calc non Af Amer 5 (*)    GFR calc Af Amer 6 (*)     Anion gap 17 (*)    All other components within normal limits  CBG MONITORING, ED - Abnormal; Notable for the following components:   Glucose-Capillary 66 (*)    All other components within normal limits  CBG MONITORING, ED - Abnormal; Notable for the following components:   Glucose-Capillary 143 (*)    All other components within normal limits  CBG MONITORING, ED - Abnormal; Notable for the following components:   Glucose-Capillary 29 (*)    All other components within normal limits  CBG MONITORING, ED - Abnormal; Notable for the following components:   Glucose-Capillary 188 (*)    All other components within normal limits  CBG MONITORING, ED - Abnormal; Notable for the following components:   Glucose-Capillary 150 (*)    All other components within normal limits  SARS CORONAVIRUS 2 (HOSPITAL ORDER, [facility])  LIPASE, BLOOD    EKG EKG Interpretation  Date/Time:  Friday October 05 2018 12:38:09 EDT Ventricular Rate:  97 PR Interval:    QRS Duration: 219 QT Interval:  483 QTC Calculation: 614 R Axis:   167 Text Interpretation:  Accelerated junctional rhythm Nonspecific intraventricular conduction delay Since last tracing QRS significantly widened compared wtih prior Confirmed by Noemi Chapel [phone]) on 10/05/2018 4:17:35 PM  EKG Interpretation  Date/Time:  Friday October 05 2018 16:26:21 EDT Ventricular Rate:  84 PR Interval:    QRS Duration: 164 QT Interval:  466 QTC Calculation: 551 R Axis:   -42 Text Interpretation:  Sinus rhythm Prolonged PR interval Left bundle branch block QRS has narrowed since last Confirmed by Noemi Chapel [phone]) on 10/05/2018 6:30:17 PM   Radiology Ct Head Wo Contrast  Result Date: 10/05/2018 CLINICAL DATA:  Paresthesia. EXAM: CT HEAD WITHOUT CONTRAST TECHNIQUE: Contiguous axial images were obtained  from the base of the skull through the vertex without intravenous contrast. COMPARISON:  None. FINDINGS: Brain: Mild  chronic ischemic white matter disease is noted. No mass effect or midline shift is noted. Ventricular size is within normal limits. There is no evidence of mass lesion, hemorrhage or acute infarction. Vascular: No hyperdense vessel or unexpected calcification. Skull: Normal. Negative for fracture or focal lesion. Sinuses/Orbits: No acute finding. Other: None. IMPRESSION: Mild chronic ischemic white matter disease. No acute intracranial abnormality seen. Electronically Signed   By: Marijo Conception M.D.   On: 10/05/2018 15:34   Dg Chest Portable 1 View  Result Date: 10/05/2018 CLINICAL DATA:  Shortness of breath.  The patient missed dialysis. EXAM: PORTABLE CHEST 1 VIEW COMPARISON:  April 24, 2018 FINDINGS: Stable cardiomegaly. The hila and mediastinum are unchanged. No pneumothorax. No nodules or masses. No focal infiltrates. No overt edema. Mild pulmonary venous congestion suspected. IMPRESSION: Cardiomegaly and mild pulmonary venous congestion.  No overt edema. Electronically Signed   By: Dorise Bullion III M.D   On: 10/05/2018 12:40    Procedures Procedures (including critical care time)  CRITICAL CARE Performed by: Kennith Maes   Total critical care time: 50 minutes  Critical care time was exclusive of separately billable procedures and treating other patients.  Critical care was necessary to treat or prevent imminent or life-threatening deterioration.  Critical care was time spent personally by me on the following activities: development of treatment plan with patient and/or surrogate as well as nursing, discussions with consultants, evaluation of patient's response to treatment, examination of patient, obtaining history from patient or surrogate, ordering and performing treatments and interventions, ordering and review of laboratory studies, ordering and review of radiographic studies, pulse oximetry and re-evaluation of patient's condition.  Medications Ordered in ED Medications    dextrose 50 % solution (  Canceled Entry 10/05/18 1252)  dextrose 50 % solution (  Canceled Entry 10/05/18 1314)  dextrose 50 % solution (has no administration in time range)  albuterol (PROVENTIL) (2.5 MG/3ML) 0.083% nebulizer solution 10 mg (has no administration in time range)  sodium bicarbonate injection 50 mEq (has no administration in time range)  dextrose 5 %-0.45 % sodium chloride infusion (has no administration in time range)  calcium gluconate 1 g/ 50 mL sodium chloride IVPB (1,000 mg Intravenous New Bag/Given 10/05/18 1245)  insulin aspart (novoLOG) injection 10 Units (10 Units Intravenous Given 10/05/18 1251)    And  dextrose 50 % solution 50 mL (50 mLs Intravenous Given 10/05/18 1251)  dextrose 50 % solution 50 mL (50 mLs Intravenous Given 10/05/18 1313)     Initial Impression / Assessment and Plan / ED Course  I have reviewed the triage vital signs and the nursing notes.  Pertinent labs & imaging results that were available during my care of the patient were reviewed by me and considered in my medical decision making (see chart for details).   Patient presents to the ED via EMS from dialysis for hyperkalemia, symptoms on arrival include generalized weakness, L sided numbness, dyspnea, palpitations, abdominal pain & diarrhea. He is nontoxic appearing, vitals without significant abnormality. Does not appear to be in respiratory distress or appear to be significantly fluid overloaded. In regards to L sided numbness- unclear definitive time of onset, outside of intervention window, subjective decreased LUE/LLE sensation w/ possible minimal LUE pronator drift however otherwise symmetric strength-> proceed w/ CT head to start potential stroke work-up, again outside of tx window therefore not a code stroke. Abdomen  w/ generalized tenderness w/o peritoneal signs- abdominal labs ordered. Will obtain EKG & CXR. Calcium gluconate ordered for presumed hyperkalemia.    12:41: Patient's EKG reveals  widened QRS more so compared to prior- discussed w/ Dr. Laverta Baltimore- will administer 10 units of insulin & 1 amp of D50  CBG: 66--> additional amp of D50 ordered- Dr. Laverta Baltimore aware & in agreement. .  CXR:  Cardiomegaly and mild pulmonary venous congestion.  No overt edema  13:24: Discussed with nursing staff, difficulty obtaining labs- will consult nephrology to make them aware of patient.   14:48: RE-EVAL: Patient remains alert & resting comfortably w/o respiratory distress. He now mentions concern for mouth abscess for past few days. States the area has been uncomfortable. Further evaluation- left lower gingiva is erythematous w/ mild swelling, possible minimal central fluctuance, no large abscess. Posterior oropharynx is symmetric appearing. Patient tolerating own secretions without difficulty. No trismus. No drooling. No hot potato voice. No swelling beneath the tongue, submandibular compartment is soft. Exam not consistent w/ Ludwigs angina or deep space infection. Will eventually need abx, will hold off until his presumed hyperkalemia w/ EKG changes is fully addressed.   14:00: CBG 143  14:04; CONSULT: Discussed w/ nephrologist Dr. Joelyn Oms- aware of patient as well as results/care thus far- requesting call back w/ labs.   CT head: Mild chronic ischemic white matter disease. No acute intracranial abnormality seen.  CBC: Fairly baseline anemia. No leukocytosis.  Lipase: WNL CMP: Pending---> called & spoke with the lab regarding patient's CMP- there is concern for lab abnormality- they states that potasium was 8.0 and calcium was 8.0 therefore they would like a re-draw.   15:34: Repeat CBG ordered.   16:15: RE-EVAL: Repeat CBG 29. Dr. Sabra Heck (supervising physician @ change of shift) @ bedside--> will give additional amp of D50, start of D5 1/2 NS infusion @ 100 cc/hr, he has placed an EJ and drawn additional blood for CMP repeat. 1 amp bicarb & albuterol tx ordered for hyperkalemia. Will re-discuss w/  nephrology.   16:18: CONSULT: Re-discussed w/ Dr. Joelyn Oms- he is coming to the ED to ensure patient is dialyzed.   16:29: RE-EVAL: Patient awake, more alert. Obtaining repeat EKG. Dr. Joelyn Oms @ bedside- requesting admission to ICU bed, patient to be dialyzed.   16:30: CONSULT: Dr. Sabra Heck has spoken with hospitalist Dr. Nehemiah Settle- accepts admission.   Repeat EKG w/ QRS somewhat narrowed.  CBG 188, 150 on repeat checks.   Final Clinical Impressions(s) / ED Diagnoses   Final diagnoses:  Hyperkalemia  Acute renal failure, unspecified acute renal failure type [facility])    ED Discharge Orders    None       Leafy Kindle 10/05/18 Raynald Blend, MD 10/05/18 [phone]

## 2018-10-05 NOTE — ED Triage Notes (Signed)
Pt brought in via [facility] from [place] c/o weakness and abdominal pain. Did not dialyze. Potasium level reported is 8. No other complaints at this time.

## 2018-10-05 NOTE — ED Provider Notes (Signed)
Medical screening examination/treatment/procedure(s) were conducted as a shared visit with non-physician practitioner(s) and myself.  I personally evaluated the patient during the encounter.  Clinical Impression:   Final diagnoses:  Hyperkalemia  Acute renal failure, unspecified acute renal failure type ([place])    The pt is critically ill with what appears to be severe hyperkalemia.  He is in end-stage renal disease patient who was brought over from the office before dialysis because of weakness, he had a severe elevation in his potassium and they decided to send him over instead of dialyzed.  On exam after getting insulin and D50 the patient became recurrently hypoglycemic, with numbers as low as 29.  After getting D50 he is more awake and alert but still somnolent and fatigued appearing and lethargic.  He is not tachycardic however his initial EKG did show significant widening of the QRS significant due to his likely severe hyperkalemia which measured at 8 on his conference of metabolic panel.  I placed an external jugular IV, 18-gauge in the left external jugular vein and drew more blood for clarification.  The patient will receive bicarbonate, he already received calcium, his EKG is starting to look better and will be repeated, nephrology was consulted, the patient will be admitted to the hospital, hospitalist paged for admission.  As to the patient's left-sided weakness this will need further work-up as well.  CT head negative for acute bleed or obvious infarction.  I discussed wtihi dr. Nehemiah Settle who will admit - Dr. Pearson Grippe has seen pt at bedside and will dialyze - pt to go to the ICU  .Critical Care Performed by: Noemi Chapel, MD Authorized by: Noemi Chapel, MD   Critical care provider statement:    Critical care time (minutes):  35   Critical care time was exclusive of:  Separately billable procedures and treating other patients and teaching time   Critical care was necessary  to treat or prevent imminent or life-threatening deterioration of the following conditions:  Renal failure   Critical care was time spent personally by me on the following activities:  Blood draw for specimens, development of treatment plan with patient or surrogate, discussions with consultants, evaluation of patient's response to treatment, examination of patient, obtaining history from patient or surrogate, ordering and performing treatments and interventions, ordering and review of laboratory studies, ordering and review of radiographic studies, pulse oximetry, re-evaluation of patient's condition and review of old charts    EKG Interpretation  Date/Time:  Friday October 05 2018 12:38:09 EDT Ventricular Rate:  97 PR Interval:    QRS Duration: 219 QT Interval:  483 QTC Calculation: 614 R Axis:   167 Text Interpretation:  Accelerated junctional rhythm Nonspecific intraventricular conduction delay Since last tracing QRS significantly widened compared wtih prior Confirmed by Noemi Chapel [phone]) on 10/05/2018 4:17:35 PM         Noemi Chapel, MD 10/05/18 2335

## 2018-10-05 NOTE — Consult Note (Signed)
Brendan Campbell Admit Date: 10/05/2018 10/05/2018 [name] Requesting Physician:  Brendan Nordmann MD  Reason for Consult:  ESRD Hyperkalemia EKG Changes, Weakness HPI:  43M ESRD MWF Brendan Campbell who presented to outpatient dialysis this morning with complaints of weakness and inability to walk.  Patient had labs on Monday, prior to his dialysis treatment, and his potassium was 8.  In speaking with outpatient HD staff, patient only dialyzes on Mondays and Fridays, choosing not to come on Wednesdays.  He takes Kayexalate on nondialysis days.  He has a left upper arm AV fistula.  In the ER patient is EKG noticed to have QRS prolongation and juncitonal rhythm.  Received insulin/dextrose, albuterol, NaHCO3, and calcium gluconate.  QRS narrowed.    Patient does not provide much in the way of medical history or an account of recent visits  PMH Incudes, from chart review:  DM2  PVD, hx/o R BKA  MDD  HLD  Creatinine, Ser (mg/dL)  Date Value  06/25/2016 8.47 (H)  05/10/2016 9.40 (H)  02/22/2016 6.94 (H)  12/16/2015 5.65 (&gt;)  09/15/2015 5.40 (&gt;)  03/03/2015 3.05 (&gt;)  ] Balance of 12 systems is negative w/ exceptions as above  PMH  Past Medical History:  Diagnosis Date  . Anemia   . Anxiety   . Blood transfusion without reported diagnosis   . CHF (congestive heart failure) ([place])   . Chronic kidney disease   . Depression   . Diabetes mellitus without complication ([place])   . End stage renal disease ([place])    M/W/F [facility]  . Hyperlipidemia   . Neuropathy   . Peripheral vascular disease ([place])   . PTSD (post-traumatic stress disorder)   . Wears glasses    reading   Brendan Campbell  Past Surgical History:  Procedure Laterality Date  . AV FISTULA PLACEMENT Left 09/22/2016   Procedure: CREATION OF LEFT ARM ARTERIOVENOUS (AV) FISTULA;  Surgeon: Waynetta Sandy, MD;  Location: [facility];  Service: Vascular;  Laterality: Left;  . AV FISTULA PLACEMENT Left 10/31/2017   Procedure:  INSERTION OF ARTERIOVENOUS (AV) GORE-TEX 4-17m STETCH GRAFT LEFT ARM;  Surgeon: CWaynetta Sandy MD;  Location: [facility]  Service: Vascular;  Laterality: Left;  . BASCILIC VEIN TRANSPOSITION Left 08/17/2017   Procedure: SECOND STAGE BASILIC VEIN TRANSPOSITION LEFT ARM;  Surgeon: CWaynetta Sandy MD;  [facility]  Service: Vascular;  Laterality: Left;  . BELOW KNEE LEG AMPUTATION Right   . CHOLECYSTECTOMY    . FOOT SURGERY    . IR FLUORO GUIDE CV LINE RIGHT  05/16/2016  . IR REMOVAL TUN CV CATH W/O FL  05/16/2016  . IR [place] VASC ACCESS RIGHT  05/16/2016   FH  Family History  Problem Relation Age of Onset  . Cancer Mother        lung  . Diabetes Mother   . Heart attack Father   . Diabetes Father   . Diabetes Sister    SH  reports that he quit smoking about 12 years ago. He has never used smokeless tobacco. He reports that he does not drink alcohol or use drugs. Allergies  Allergies  Allergen Reactions  . Tape Other (See Comments)    Pulls skin off   Home medications Prior to Admission medications   Medication Sig Start Date End Date Taking? Authorizing Provider  aspirin 81 MG chewable tablet Chew 81 mg by mouth daily.   Yes [provider]  blood glucose meter kit and  supplies KIT Dispense based on patient and insurance preference. Use up to four times daily as directed. (FOR ICD-9 250.00, 250.01). 06/02/16  Yes Timmothy Euler, MD  Insulin Glargine (LANTUS SOLOSTAR) 100 UNIT/ML Solostar Pen Inject 10-15 Units into the skin daily at 10 pm. Patient taking differently: Inject 10 Units into the skin daily at 10 pm.  07/21/16  Yes Timmothy Euler, MD  multivitamin (RENA-VIT) TABS tablet Take 1 tablet by mouth daily. 08/04/18  Yes [provider]  SURE COMFORT PEN NEEDLES 31G X 5 MM MISC USE AS DIRECTED DAILY. 11/07/16  Yes Timmothy Euler, MD  atorvastatin (LIPITOR) 10 MG tablet Take 1 tablet (10 mg total) by mouth every evening. Patient not  taking: Reported on 10/05/2018 06/02/16   Timmothy Euler, MD  baclofen (LIORESAL) 10 MG tablet Take 1 tablet (10 mg total) by mouth every 8 (eight) hours as needed for muscle spasms. Patient not taking: Reported on 10/11/2017 03/08/16   Meredith Staggers, MD  oxyCODONE-acetaminophen (PERCOCET/ROXICET) 5-325 MG tablet Take 1 tablet by mouth every 6 (six) hours as needed. Patient not taking: Reported on 10/05/2018 10/31/17   Gabriel Earing, PA-C  polyethylene glycol powder (GLYCOLAX/MIRALAX) powder 1/2 to 2 capfuls daily for constipation. Patient not taking: Reported on 10/11/2017 06/02/16   Timmothy Euler, MD    Current Medications Scheduled Meds: . albuterol  10 mg Nebulization Once  . [START ON 10/06/2018] Chlorhexidine Gluconate Cloth  6 each Topical Q0600  . dextrose      . dextrose      . dextrose      . sodium bicarbonate      . sodium bicarbonate  50 mEq Intravenous Once   Continuous Infusions: . calcium gluconate    . dextrose 5 % and 0.45% NaCl     PRN Meds:.  CBC Recent Labs  Lab 10/05/18 1449  WBC 9.3  HGB 10.1*  HCT 32.7*  MCV 109.7*  PLT 712   Basic Metabolic Panel No results for input(s): NA, K, CL, CO2, GLUCOSE, BUN, CREATININE, CALCIUM, PHOS in the last 168 hours.  Invalid input(s): ALB  Physical Exam  Blood pressure 121/71, pulse 98, temperature (!) 97.5 F (36.4 C), temperature source Oral, resp. rate 19, height 5' 9"  (1.753 m), weight 104.3 kg, SpO2 97 %. GEN: Chronically ill-appearing, somnolent, interactive ENT: Poor dentition EYES: EOMI CV: RRR, normal S1-S2, no rub PULM: Clear bilaterally ABD: Soft, nontender SKIN: No rashes or lesions EXT: No edema Left upper extremity AV fistula with bruit and thrill present   Assessment 75M ESRD with Hyperkalemia and EKG Changes 1. ESRD, noncompliant (does got on Wednesday), MWF [facility] LUE AVF 2. Hyperkalemia, severe and life threatening with EKG changes 3. DM2  Plan 1. HD now, 4h, 1K bath, 2L  UF, no heparin, draw labs from AVF prior to HD 2. Ca Gluc 2gm IV now while we arrange HD 3. Pt will need extensive counseling about continuing Wednesday HD treatments as necessary 4. Request outpatient records 5. Daily weights, Daily Renal Panel, Strict I/Os, Avoid nephrotoxins (NSAIDs, judicious IV Contrast)    Pearson Grippe MD 10/05/2018, 4:38 PM

## 2018-10-05 NOTE — ED Notes (Signed)
Date and time results received: 10/05/18 1700   Test: Glucose  Critical Value: 40  Name of Provider Notified: Kennith Maes, PA-C  Orders Received? See [facility]

## 2018-10-05 NOTE — Procedures (Addendum)
    EMERGENT HEMODIALYSIS TREATMENT NOTE:   Unable to access AVG; three unsuccessful attempts on venous limb.  Dr. Joelyn Oms arranged for femoral vas-cath placement by Dr. Arnoldo Morale.  Pre-HD K 6.4  4 hour HD session completed without incident.  Average Qb 355. Hemodynamically stable.  2 hours on 1K bath, then 2K bath.  Goal met: 2.1 liters removed without interruption in ultrafiltration.  All blood was returned.  Pt counseled on life-threatening risks of hyperkalemia.  Adherence with outpatient HD regimen strongly encouraged.  Rockwell Alexandria, RN

## 2018-10-05 NOTE — ED Notes (Signed)
Date and time results received: 10/05/18 1700 (use smartphrase ".now" to insert current time)  Test: Potassium  Critical Value: 7.2  Name of Provider Notified: Kennith Maes, PA-C  Orders Received? Has improved from previous

## 2018-10-06 ENCOUNTER — Other Ambulatory Visit: Payer: Self-pay

## 2018-10-06 LAB — HEMOGLOBIN A1C
Hgb A1c MFr Bld: 5.2 % (ref 4.8–5.6)
Mean Plasma Glucose: 102.54 mg/dL

## 2018-10-06 LAB — GLUCOSE, CAPILLARY
Glucose-Capillary: 140 mg/dL — ABNORMAL HIGH (ref 70–99)
Glucose-Capillary: 78 mg/dL (ref 70–99)
Glucose-Capillary: 89 mg/dL (ref 70–99)
Glucose-Capillary: 70 mg/dL (ref 70–99)
Glucose-Capillary: 80 mg/dL (ref 70–99)

## 2018-10-06 LAB — BASIC METABOLIC PANEL
Anion gap: 12 (ref 5–15)
BUN: 24 mg/dL — ABNORMAL HIGH (ref 6–20)
CO2: 29 mmol/L (ref 22–32)
Calcium: 8.1 mg/dL — ABNORMAL LOW (ref 8.9–10.3)
Chloride: 96 mmol/L — ABNORMAL LOW (ref 98–111)
Creatinine, Ser: 4.61 mg/dL — ABNORMAL HIGH (ref 0.61–1.24)
GFR calc Af Amer: 16 mL/min — ABNORMAL LOW (ref >60)
GFR calc non Af Amer: 14 mL/min — ABNORMAL LOW (ref >60)
Glucose, Bld: 84 mg/dL (ref 70–99)
Potassium: 5.3 mmol/L — ABNORMAL HIGH (ref 3.5–5.1)
Sodium: 137 mmol/L (ref 135–145)

## 2018-10-06 LAB — CBC WITH DIFFERENTIAL/PLATELET
Abs Immature Granulocytes: 0.02 10*3/uL (ref 0.00–0.07)
Basophils Absolute: 0.1 10*3/uL (ref 0.0–0.1)
Basophils Relative: 1 %
Eosinophils Absolute: 0.2 10*3/uL (ref 0.0–0.5)
Eosinophils Relative: 3 %
HCT: 28 % — ABNORMAL LOW (ref 39.0–52.0)
Hemoglobin: 9 g/dL — ABNORMAL LOW (ref 13.0–17.0)
Immature Granulocytes: 0 %
Lymphocytes Relative: 24 %
Lymphs Abs: 2 10*3/uL (ref 0.7–4.0)
MCH: 34.1 pg — ABNORMAL HIGH (ref 26.0–34.0)
MCHC: 32.1 g/dL (ref 30.0–36.0)
MCV: 106.1 fL — ABNORMAL HIGH (ref 80.0–100.0)
Monocytes Absolute: 0.5 10*3/uL (ref 0.1–1.0)
Monocytes Relative: 6 %
Neutro Abs: 5.6 10*3/uL (ref 1.7–7.7)
Neutrophils Relative %: 66 %
Platelets: 195 10*3/uL (ref 150–400)
RBC: 2.64 MIL/uL — ABNORMAL LOW (ref 4.22–5.81)
RDW: 12.9 % (ref 11.5–15.5)
WBC: 8.3 10*3/uL (ref 4.0–10.5)
nRBC: 0 % (ref 0.0–0.2)

## 2018-10-06 LAB — CBC
HCT: 29 % — ABNORMAL LOW (ref 39.0–52.0)
Hemoglobin: 9.3 g/dL — ABNORMAL LOW (ref 13.0–17.0)
MCH: 34.3 pg — ABNORMAL HIGH (ref 26.0–34.0)
MCHC: 32.1 g/dL (ref 30.0–36.0)
MCV: 107 fL — ABNORMAL HIGH (ref 80.0–100.0)
Platelets: 206 10*3/uL (ref 150–400)
RBC: 2.71 MIL/uL — ABNORMAL LOW (ref 4.22–5.81)
RDW: 12.9 % (ref 11.5–15.5)
WBC: 8.8 10*3/uL (ref 4.0–10.5)
nRBC: 0 % (ref 0.0–0.2)

## 2018-10-06 MED ORDER — SODIUM CHLORIDE 0.9 % IV SOLN
3.0000 g | Freq: Two times a day (BID) | INTRAVENOUS | Status: DC
  Administered 2018-10-06 – 2018-10-08 (×4): 3 g via INTRAVENOUS
  Filled 2018-10-06 (×8): qty 8
  Filled 2018-10-06: qty 3

## 2018-10-06 MED ORDER — ALUM & MAG HYDROXIDE-SIMETH 200-200-20 MG/5ML PO SUSP
15.0000 mL | Freq: Four times a day (QID) | ORAL | Status: DC | PRN
  Administered 2018-10-06: 15 mL via ORAL
  Filled 2018-10-06: qty 30

## 2018-10-06 MED ORDER — ACETAMINOPHEN 325 MG PO TABS
650.0000 mg | ORAL_TABLET | Freq: Four times a day (QID) | ORAL | Status: AC | PRN
  Administered 2018-10-06: 650 mg via ORAL
  Filled 2018-10-06: qty 2

## 2018-10-06 MED ORDER — HEPARIN SODIUM (PORCINE) 1000 UNIT/ML IJ SOLN
3100.0000 [IU] | Freq: Once | INTRAMUSCULAR | Status: DC
  Filled 2018-10-06: qty 4

## 2018-10-06 MED ORDER — SODIUM CHLORIDE 0.9 % IV SOLN
1.5000 g | Freq: Once | INTRAVENOUS | Status: DC
  Filled 2018-10-06: qty 4

## 2018-10-06 MED ORDER — HYDROCODONE-ACETAMINOPHEN 5-325 MG PO TABS
1.0000 | ORAL_TABLET | Freq: Four times a day (QID) | ORAL | Status: DC | PRN
  Administered 2018-10-06 – 2018-10-08 (×6): 1 via ORAL
  Filled 2018-10-06 (×7): qty 1

## 2018-10-06 NOTE — Progress Notes (Signed)
PROGRESS NOTE    Brendan Campbell  [id number] DOB: 1970-11-29 DOA: 10/05/2018 PCP: System, Pcp Not In   Brief Narrative:  Per HPI: Brendan Campbell is a 48 y.o. male with a history of end-stage renal disease on Monday Wednesday Friday dialysis, diabetes type 2, CHF, anemia of chronic renal failure, hyperlipidemia.  Patient is also status post right BKA.  Patient was sent to the hospital due to hyperkalemia at dialysis.  There, his potassium was reported as 8.  The patient reports feeling weak over the past 2 to 3 days especially with left arm weakness that is greater than right arm weakness.  He is also been noting difficulty ambulating with weakness in his left leg.  His symptoms are worsening.  He reports taking his medicines as prescribed.  He reportedly has not missed any dialysis.  Patient apparently routinely misses his Wednesday hemodialysis and only goes Mondays and Fridays.  He has also had worsening left jaw swelling and dental abscess that has not been treated.  He has undergone left femoral hemodialysis catheter placement due to graft failure and complains of minimal pain to this area.  He has undergone an urgent hemodialysis on 8/28 with potassium now down to 5.3 this a.m.  Repeat EKG improved and normal sinus rhythm at 84 bpm this morning.  Nephrology recommends transfer to [facility] so that vascular surgery can reestablish a better access for hemodialysis on him.  Assessment & Plan:   Principal Problem:   Hyperkalemia Active Problems:   DM2 (diabetes mellitus, type 2) (HCC)   ESRD on dialysis ([place])   Anemia due to end stage renal disease (HCC)   Weakness   Severe hyperkalemia with life-threatening EKG changes- improved and stabilized -This is secondary to hemodialysis noncompliance as patient does not go on Wednesdays -Status post urgent hemodialysis after left femoral HD cath placement on 8/28 -Potassium now down to 5.3 from 7.2 on admission -EKG improved on repeat this  a.m.  Left AV fistula failure -Patient is now status post left femoral HD catheter placement -Per nephrology, patient will need to go to [facility] for vascular surgery evaluation to reestablish proper access.  Nephrology to call vascular surgery to inform them to consult.  Left-sided dental abscess -Patient has not had any intervention in the outpatient setting -We will place on IV Unasyn empirically for now and may transition to oral antibiotics soon -We will need follow-up outpatient with dentist  Global paresis likely secondary to hyperkalemia-improved -No evidence of CVA on CT scan -May need to consider brain MRI if weakness or neurological sequelae persists, however it seems to have improved with correction of hypokalemia thus far  Recent right BKA -Secondary to diabetic foot wound -Ambulates with a cane and uses prosthetic  ESRD on HD M/W/F -Will need to continue follow-up with nephrology to help manage electrolytes and hemodialysis  Type 2 diabetes-stable blood glucose readings noted -Continue Lantus -Continue carb modified diet -SSI   DVT prophylaxis: Heparin Code Status: Full Family Communication: None at bedside Disposition Plan: Transfer to [facility] for vascular surgery evaluation to reestablish proper hemodialysis access.  Continue monitoring of potassium levels carefully.  Maintain on Unasyn as prescribed for dental abscess and likely convert to oral agent such as Augmentin soon.   Consultants:   Nephrology  General Surgery-for L femoral HD access on 8/28  Procedures:   Left femoral hemodialysis catheter placement on 8/28  Antimicrobials:  Anti-infectives (From admission, onward)   Start  Dose/Rate Route Frequency Ordered Stop   10/06/18 0900  Ampicillin-Sulbactam (UNASYN) 3 g in sodium chloride 0.9 % 100 mL IVPB     3 g 200 mL/hr over 30 Minutes Intravenous Every 12 hours 10/06/18 0817     10/06/18 0815  ampicillin-sulbactam (UNASYN) 1.5 g in  sodium chloride 0.9 % 100 mL IVPB  Status:  Discontinued     1.5 g 200 mL/hr over 30 Minutes Intravenous  Once 10/06/18 0809 10/06/18 0817       Subjective: Patient seen and evaluated today with complaints of minimal pain to his right femoral cath site.  He is complaining of left jaw swelling and pain this morning stating that he has an abscessed tooth.  Objective: Vitals:   10/06/18 0700 10/06/18 0724 10/06/18 0800 10/06/18 0900  BP: 105/61  106/63 110/66  Pulse: 86 85 85 88  Resp: 13 16 13  (!) 0  Temp:  98.5 F (36.9 C)    TempSrc:  Oral    SpO2: 99% 97% 98% 96%  Weight:      Height:        Intake/Output Summary (Last 24 hours) at 10/06/2018 1023 Last data filed at 10/06/2018 0900 Gross per 24 hour  Intake 1050.1 ml  Output 2123 ml  Net -1072.9 ml   Filed Weights   10/05/18 1215 10/05/18 2000 10/05/18 2110  Weight: 104.3 kg 87 kg 87 kg    Examination:  General exam: Appears calm and comfortable, left jaw swelling and tenderness noted Respiratory system: Clear to auscultation. Respiratory effort normal. Cardiovascular system: S1 & S2 heard, RRR. No JVD, murmurs, rubs, gallops or clicks. No pedal edema. Gastrointestinal system: Abdomen is nondistended, soft and nontender. No organomegaly or masses felt. Normal bowel sounds heard. Central nervous system: Alert and awake. Extremities: No significant edema to left lower extremity.  Right BKA noted. Skin: No rashes, lesions or ulcers Psychiatry: Flat affect.    Data Reviewed: I have personally reviewed following labs and imaging studies  CBC: Recent Labs  Lab 10/05/18 1449 10/06/18 0423 10/06/18 0720  WBC 9.3 8.8 8.3  NEUTROABS  --   --  5.6  HGB 10.1* 9.3* 9.0*  HCT 32.7* 29.0* 28.0*  MCV 109.7* 107.0* 106.1*  PLT 197 206 0000000   Basic Metabolic Panel: Recent Labs  Lab 10/05/18 1603 10/05/18 2022 10/06/18 0720  NA 139 138 137  K 7.2* 6.4* 5.3*  CL 101 98 96*  CO2 21* 19* 29  GLUCOSE 40* 156* 84  BUN  79* 79* 24*  CREATININE 10.73* 10.44* 4.61*  CALCIUM 8.3* 8.4* 8.1*   GFR: Estimated Creatinine Clearance: 21.6 mL/min (A) (by C-G formula based on SCr of 4.61 mg/dL (H)). Liver Function Tests: Recent Labs  Lab 10/05/18 1603 10/05/18 2022  AST 33 32  ALT 38 34  ALKPHOS 112 103  BILITOT 0.9 0.7  PROT 7.8 7.0  ALBUMIN 4.0 3.6   Recent Labs  Lab 10/05/18 1449  LIPASE 34   No results for input(s): AMMONIA in the last 168 hours. Coagulation Profile: No results for input(s): INR, PROTIME in the last 168 hours. Cardiac Enzymes: No results for input(s): CKTOTAL, CKMB, CKMBINDEX, TROPONINI in the last 168 hours. BNP (last 3 results) No results for input(s): PROBNP in the last 8760 hours. HbA1C: No results for input(s): HGBA1C in the last 72 hours. CBG: Recent Labs  Lab 10/05/18 1607 10/05/18 1704 10/05/18 1834 10/06/18 0515 10/06/18 0720  GLUCAP 29* 188* 150* 140* 78   Lipid Profile: No  results for input(s): CHOL, HDL, LDLCALC, TRIG, CHOLHDL, LDLDIRECT in the last 72 hours. Thyroid Function Tests: No results for input(s): TSH, T4TOTAL, FREET4, T3FREE, THYROIDAB in the last 72 hours. Anemia Panel: No results for input(s): VITAMINB12, FOLATE, FERRITIN, TIBC, IRON, RETICCTPCT in the last 72 hours. Sepsis Labs: No results for input(s): PROCALCITON, LATICACIDVEN in the last 168 hours.  Recent Results (from the past 240 hour(s))  SARS Coronavirus 2 [facility] order, Performed in [facility] hospital lab) Nasopharyngeal Nasopharyngeal Swab     Status: None   Collection Time: 10/05/18  1:43 PM   Specimen: Nasopharyngeal Swab  Result Value Ref Range Status   SARS Coronavirus 2 NEGATIVE NEGATIVE Final    Comment: (NOTE) If result is NEGATIVE SARS-CoV-2 target nucleic acids are NOT DETECTED. The SARS-CoV-2 RNA is generally detectable in upper and lower  respiratory specimens during the acute phase of infection. The lowest  concentration of SARS-CoV-2 viral copies this assay  can detect is 250  copies / mL. A negative result does not preclude SARS-CoV-2 infection  and should not be used as the sole basis for treatment or other  patient management decisions.  A negative result may occur with  improper specimen collection / handling, submission of specimen other  than nasopharyngeal swab, presence of viral mutation(s) within the  areas targeted by this assay, and inadequate number of viral copies  (<250 copies / mL). A negative result must be combined with clinical  observations, patient history, and epidemiological information. If result is POSITIVE SARS-CoV-2 target nucleic acids are DETECTED. The SARS-CoV-2 RNA is generally detectable in upper and lower  respiratory specimens dur ing the acute phase of infection.  Positive  results are indicative of active infection with SARS-CoV-2.  Clinical  correlation with patient history and other diagnostic information is  necessary to determine patient infection status.  Positive results do  not rule out bacterial infection or co-infection with other viruses. If result is PRESUMPTIVE POSTIVE SARS-CoV-2 nucleic acids MAY BE PRESENT.   A presumptive positive result was obtained on the submitted specimen  and confirmed on repeat testing.  While 2019 novel coronavirus  (SARS-CoV-2) nucleic acids may be present in the submitted sample  additional confirmatory testing may be necessary for epidemiological  and / or clinical management purposes  to differentiate between  SARS-CoV-2 and other Sarbecovirus currently known to infect humans.  If clinically indicated additional testing with an alternate test  methodology [phone]) is advised. The SARS-CoV-2 RNA is generally  detectable in upper and lower respiratory sp ecimens during the acute  phase of infection. The expected result is Negative. Fact Sheet for Patients:  StrictlyIdeas.no Fact Sheet for Healthcare  Providers: BankingDealers.co.za This test is not yet approved or cleared by the [place] FDA and has been authorized for detection and/or diagnosis of SARS-CoV-2 by FDA under an Emergency Use Authorization (EUA).  This EUA will remain in effect (meaning this test can be used) for the duration of the COVID-19 declaration under Section 564(b)(1) of the Act, 21 U.S.C. section 360bbb-3(b)(1), unless the authorization is terminated or revoked sooner. Performed at [facility], [address], [place], [place] [address]   MRSA PCR Screening     Status: None   Collection Time: 10/05/18  7:17 PM   Specimen: Nasopharyngeal  Result Value Ref Range Status   MRSA by PCR NEGATIVE NEGATIVE Final    Comment:        The GeneXpert MRSA Assay (FDA approved for NASAL specimens only), is one  component of a comprehensive MRSA colonization surveillance program. It is not intended to diagnose MRSA infection nor to guide or monitor treatment for MRSA infections. Performed at [facility], [address] [place],  [address]          Radiology Studies: Ct Head Wo Contrast  Result Date: 10/05/2018 CLINICAL DATA:  Paresthesia. EXAM: CT HEAD WITHOUT CONTRAST TECHNIQUE: Contiguous axial images were obtained from the base of the skull through the vertex without intravenous contrast. COMPARISON:  None. FINDINGS: Brain: Mild chronic ischemic white matter disease is noted. No mass effect or midline shift is noted. Ventricular size is within normal limits. There is no evidence of mass lesion, hemorrhage or acute infarction. Vascular: No hyperdense vessel or unexpected calcification. Skull: Normal. Negative for fracture or focal lesion. Sinuses/Orbits: No acute finding. Other: None. IMPRESSION: Mild chronic ischemic white matter disease. No acute intracranial abnormality seen. Electronically Signed   By: Marijo Conception M.D.   On: 10/05/2018 15:34   Dg Chest Portable 1  View  Result Date: 10/05/2018 CLINICAL DATA:  Shortness of breath.  The patient missed dialysis. EXAM: PORTABLE CHEST 1 VIEW COMPARISON:  April 24, 2018 FINDINGS: Stable cardiomegaly. The hila and mediastinum are unchanged. No pneumothorax. No nodules or masses. No focal infiltrates. No overt edema. Mild pulmonary venous congestion suspected. IMPRESSION: Cardiomegaly and mild pulmonary venous congestion.  No overt edema. Electronically Signed   By: Dorise Bullion III M.D   On: 10/05/2018 12:40        Scheduled Meds:  Chlorhexidine Gluconate Cloth  6 each Topical Q0600   heparin  3,100 Units Intracatheter Once   heparin  5,000 Units Subcutaneous Q8H   insulin aspart  0-5 Units Subcutaneous QHS   insulin aspart  0-9 Units Subcutaneous TID WC   insulin glargine  10 Units Subcutaneous Q2200   multivitamin  1 tablet Oral Daily   Continuous Infusions:  sodium chloride     sodium chloride     ampicillin-sulbactam (UNASYN) IV 3 g (10/06/18 1015)   dextrose 5 % and 0.45% NaCl 100 mL/hr at 10/06/18 1012     LOS: 1 day    Time spent: 30 minutes    Rosalynd Mcwright Darleen Crocker, DO [facility] Pager [phone]  If 7PM-7AM, please contact night-coverage [email or web address] Password [facility] 10/06/2018, 10:23 AM

## 2018-10-06 NOTE — Progress Notes (Signed)
Patient complaining of pain at site of newly placed femoral for dialysis. Asking for pain medicine but no PRN meds ordered. Also inquiring about ABX due to dental abscess. Paged [place] for PRN meds and possible ABX.

## 2018-10-06 NOTE — Progress Notes (Signed)
Admit: 10/05/2018 LOS: 1  64M ESRD with Hyperkalemia and EKG Changes  Subjective:  [name] Very diffiuclt cannulation of venous segment of AVF, then high pressures and infiltrated . Req temp Fem HD cath to complete HD< greatly appreciate help of Dr. Arnoldo Morale . This AM  K 5.3 . On Unasyn for dental abscess . BP stable on RA  08/28 0701 - 08/29 0700 In: 1050.1 [I.V.:1000.1; IV Piggyback:50] Out: 2123   [facility] Weights   10/05/18 1215 10/05/18 2000 10/05/18 2110  Weight: 104.3 kg 87 kg 87 kg    Scheduled Meds: . Chlorhexidine Gluconate Cloth  6 each Topical Q0600  . heparin  3,100 Units Intracatheter Once  . heparin  5,000 Units Subcutaneous Q8H  . insulin aspart  0-5 Units Subcutaneous QHS  . insulin aspart  0-9 Units Subcutaneous TID WC  . insulin glargine  10 Units Subcutaneous Q2200  . multivitamin  1 tablet Oral Daily   Continuous Infusions: . sodium chloride    . sodium chloride    . ampicillin-sulbactam (UNASYN) IV 3 g (10/06/18 1015)  . dextrose 5 % and 0.45% NaCl 100 mL/hr at 10/06/18 1012   PRN Meds:.sodium chloride, sodium chloride, acetaminophen, alum & mag hydroxide-simeth, HYDROcodone-acetaminophen, lidocaine (PF), lidocaine-prilocaine, ondansetron **OR** ondansetron (ZOFRAN) IV, pentafluoroprop-tetrafluoroeth, polyethylene glycol  Current Labs: reviewed    Physical Exam:  Blood pressure 110/66, pulse 85, temperature 97.9 F (36.6 C), temperature source Oral, resp. rate 13, height 5\' 9"  (1.753 m), weight 87 kg, SpO2 96 %. GEN: Chronically ill-appearing, somnolent, interactive ENT: Poor dentition EYES: EOMI CV: RRR, normal S1-S2, no rub PULM: Clear bilaterally ABD: Soft, nontender SKIN: No rashes or lesions EXT: No edema Left upper extremity AV fistula with bruit and thrill present  A 1. ESRD, noncompliant (does got on Wednesday), MWF [facility] LUE AVF 2. Suspect recirculation of AVF, likely sig venous stenosis, could not complete HD 8/28 req Temp Fem HD  Cath 3. Hyperkalemia, severe and life threatening with EKG changes, improved 4. DM2 5. Dental abscess on unasyn 6. Anemia, Hb stable in 9s  P . HyperK driven by noncompliance, likely access malfunction, and diet (admits to heavy potatoes) . Needs Fgram, will transfer to [facility] for VVS eval . Next HD tentative 8/31 . Stop IVFs . Medication Issues; o Preferred narcotic agents for pain control are hydromorphone, fentanyl, and methadone. Morphine should not be used.  o Baclofen should be avoided o Avoid oral sodium phosphate and magnesium citrate based laxatives / bowel preps    Pearson Grippe MD 10/06/2018, 11:26 AM  Recent Labs  Lab 10/05/18 1603 10/05/18 2022 10/06/18 0720  NA 139 138 137  K 7.2* 6.4* 5.3*  CL 101 98 96*  CO2 21* 19* 29  GLUCOSE 40* 156* 84  BUN 79* 79* 24*  CREATININE 10.73* 10.44* 4.61*  CALCIUM 8.3* 8.4* 8.1*   Recent Labs  Lab 10/05/18 1449 10/06/18 0423 10/06/18 0720  WBC 9.3 8.8 8.3  NEUTROABS  --   --  5.6  HGB 10.1* 9.3* 9.0*  HCT 32.7* 29.0* 28.0*  MCV 109.7* 107.0* 106.1*  PLT 197 206 195

## 2018-10-06 NOTE — Progress Notes (Signed)
ZEEV WIANT [id number] Admission Data: 10/06/2018 4:51 PM Attending Provider: Rodena Goldmann, DO  [id number], Pcp Not In Consults/ Treatment Team: Treatment Team:  [name], MD  ANIBAL PEAL is a 48 y.o. male patient admitted from ED awake, alert  & orientated  X 3,  Full Code, VSS - Blood pressure 106/64, pulse 88, temperature 97.9 F (36.6 C), temperature source Oral, resp. rate 13, height 5\' 9"  (1.753 m), weight 87 kg, SpO2 100 %., O2    RA no c/o shortness of breath, no c/o chest pain, no distress noted. Tele # 33 placed and pt is currently running:normal sinus rhythm.   IV site WDL:  wrist right, condition patent and no redness, external jugular left, condition patent and no redness and femoral left, condition patent and no redness with a transparent dsg that's clean dry and intact.  Allergies:   Allergies  Allergen Reactions  . Tape Other (See Comments)    Pulls skin off     Past Medical History:  Diagnosis Date  . Anemia   . Anxiety   . Blood transfusion without reported diagnosis   . CHF (congestive heart failure) ([place])   . Chronic kidney disease   . Depression   . Diabetes mellitus without complication ([place])   . End stage renal disease ([place])    M/W/F [facility]  . Hyperlipidemia   . Neuropathy   . Peripheral vascular disease ([place])   . PTSD (post-traumatic stress disorder)   . Wears glasses    reading    History:  obtained from chart review.  Pt orientation to unit, room and routine. Information packet given to patient/family and safety video watched.  Admission INP armband ID verified with patient/family, and in place. SR up x 2, fall risk assessment complete with Patient and family verbalizing understanding of risks associated with falls. Pt verbalizes an understanding of how to use the call bell and to call for help before getting out of bed.  Skin, clean-dry- intact without evidence of bruising, or skin tears.   No evidence of skin break down noted on  exam. no rashes, no ecchymoses, no petechiae, no nodules, no jaundice, no purpura, no wounds    Will cont to monitor and assist as needed.  Tresa Endo, RN 10/06/2018 4:51 PM

## 2018-10-06 NOTE — Progress Notes (Signed)
ANTIBIOTIC CONSULT NOTE-Preliminary  Pharmacy Consult for Unasyn Indication: dental abscess  Allergies  Allergen Reactions  . Tape Other (See Comments)    Pulls skin off    Patient Measurements: Height: 5\' 9"  (175.3 cm) Weight: 191 lb 12.8 oz (87 kg)(without prosthesis) IBW/kg (Calculated) : 70.7 Adjusted Body Weight:   Vital Signs: Temp: 98.5 F (36.9 C) (08/29 0724) Temp Source: Oral (08/29 0724) BP: 111/68 (08/29 0100) Pulse Rate: 85 (08/29 0724)  Labs: Recent Labs    10/05/18 1449 10/05/18 1603 10/05/18 2022 10/06/18 0423 10/06/18 0720  WBC 9.3  --   --  8.8 8.3  HGB 10.1*  --   --  9.3* 9.0*  PLT 197  --   --  206 195  CREATININE  --  10.73* 10.44*  --   --     Estimated Creatinine Clearance: 9.6 mL/min (A) (by C-G formula based on SCr of 10.44 mg/dL (H)).  No results for input(s): VANCOTROUGH, VANCOPEAK, VANCORANDOM, GENTTROUGH, GENTPEAK, GENTRANDOM, TOBRATROUGH, TOBRAPEAK, TOBRARND, AMIKACINPEAK, AMIKACINTROU, AMIKACIN in the last 72 hours.   Microbiology: Recent Results (from the past 720 hour(s))  SARS Coronavirus 2 [facility] order, Performed in [facility] hospital lab) Nasopharyngeal Nasopharyngeal Swab     Status: None   Collection Time: 10/05/18  1:43 PM   Specimen: Nasopharyngeal Swab  Result Value Ref Range Status   SARS Coronavirus 2 NEGATIVE NEGATIVE Final    Comment: (NOTE) If result is NEGATIVE SARS-CoV-2 target nucleic acids are NOT DETECTED. The SARS-CoV-2 RNA is generally detectable in upper and lower  respiratory specimens during the acute phase of infection. The lowest  concentration of SARS-CoV-2 viral copies this assay can detect is 250  copies / mL. A negative result does not preclude SARS-CoV-2 infection  and should not be used as the sole basis for treatment or other  patient management decisions.  A negative result may occur with  improper specimen collection / handling, submission of specimen other  than nasopharyngeal swab,  presence of viral mutation(s) within the  areas targeted by this assay, and inadequate number of viral copies  (<250 copies / mL). A negative result must be combined with clinical  observations, patient history, and epidemiological information. If result is POSITIVE SARS-CoV-2 target nucleic acids are DETECTED. The SARS-CoV-2 RNA is generally detectable in upper and lower  respiratory specimens dur ing the acute phase of infection.  Positive  results are indicative of active infection with SARS-CoV-2.  Clinical  correlation with patient history and other diagnostic information is  necessary to determine patient infection status.  Positive results do  not rule out bacterial infection or co-infection with other viruses. If result is PRESUMPTIVE POSTIVE SARS-CoV-2 nucleic acids MAY BE PRESENT.   A presumptive positive result was obtained on the submitted specimen  and confirmed on repeat testing.  While 2019 novel coronavirus  (SARS-CoV-2) nucleic acids may be present in the submitted sample  additional confirmatory testing may be necessary for epidemiological  and / or clinical management purposes  to differentiate between  SARS-CoV-2 and other Sarbecovirus currently known to infect humans.  If clinically indicated additional testing with an alternate test  methodology [id number]) is advised. The SARS-CoV-2 RNA is generally  detectable in upper and lower respiratory sp ecimens during the acute  phase of infection. The expected result is Negative. Fact Sheet for Patients:  StrictlyIdeas.no Fact Sheet for Healthcare Providers: BankingDealers.co.za This test is not yet approved or cleared by the [place] FDA and has been authorized for  detection and/or diagnosis of SARS-CoV-2 by FDA under an Emergency Use Authorization (EUA).  This EUA will remain in effect (meaning this test can be used) for the duration of the COVID-19 declaration under  Section 564(b)(1) of the Act, 21 U.S.C. section 360bbb-3(b)(1), unless the authorization is terminated or revoked sooner. Performed at [facility], [address] [place], [place] [address]   MRSA PCR Screening     Status: None   Collection Time: 10/05/18  7:17 PM   Specimen: Nasopharyngeal  Result Value Ref Range Status   MRSA by PCR NEGATIVE NEGATIVE Final    Comment:        The GeneXpert MRSA Assay (FDA approved for NASAL specimens only), is one component of a comprehensive MRSA colonization surveillance program. It is not intended to diagnose MRSA infection nor to guide or monitor treatment for MRSA infections. Performed at [facility], [address], [place], [place] [address]     Medical History: Past Medical History:  Diagnosis Date  . Anemia   . Anxiety   . Blood transfusion without reported diagnosis   . CHF (congestive heart failure) ([place])   . Chronic kidney disease   . Depression   . Diabetes mellitus without complication ([place])   . End stage renal disease ([place])    M/W/F [facility]  . Hyperlipidemia   . Neuropathy   . Peripheral vascular disease ([place])   . PTSD (post-traumatic stress disorder)   . Wears glasses    reading    Medications:  Anti-infectives (From admission, onward)   Start     Dose/Rate Route Frequency Ordered Stop   10/06/18 0815  ampicillin-sulbactam (UNASYN) 1.5 g in sodium chloride 0.9 % 100 mL IVPB     1.5 g 200 mL/hr over 30 Minutes Intravenous  Once 10/06/18 0809        Assessment: 48 yo male received emergent HD last night.  He has a dental abscess and pharmacy has been asked to dose Unasyn.   Plan:  Preliminary review of pertinent patient information completed.  Protocol will be initiated with dose(s) of Unasyn 1.5g x1.  Forestine Na clinical pharmacist will complete review during morning rounds to assess patient and finalize treatment regimen if needed.  Nyra Capes, RPH 10/06/2018,8:09 AM

## 2018-10-06 NOTE — Progress Notes (Signed)
[facility] came for transport and transported belongings and patient with them. Called report to [facility] RN who will be taking patient in 5W-34;

## 2018-10-06 NOTE — Progress Notes (Signed)
Pharmacy Antibiotic Note  Brendan Campbell is a 48 y.o. male admitted on 10/05/2018 with dental abcess.  Pharmacy has been consulted for Unasyn dosing.  Plan: Unasyn 3000 mg IV every 12 hours. Monitor labs, c/s, and patient improvement.  Height: 5\' 9"  (175.3 cm) Weight: 191 lb 12.8 oz (87 kg)(without prosthesis) IBW/kg (Calculated) : 70.7  Temp (24hrs), Avg:97.9 F (36.6 C), Min:97.4 F (36.3 C), Max:98.5 F (36.9 C)  Recent Labs  Lab 10/05/18 1449 10/05/18 1603 10/05/18 2022 10/06/18 0423 10/06/18 0720  WBC 9.3  --   --  8.8 8.3  CREATININE  --  10.73* 10.44*  --  4.61*    Estimated Creatinine Clearance: 21.6 mL/min (A) (by C-G formula based on SCr of 4.61 mg/dL (H)).    Allergies  Allergen Reactions  . Tape Other (See Comments)    Pulls skin off    Antimicrobials this admission: Unasyn 8/29 >>     Dose adjustments this admission: Unasyn  Microbiology results    8/29 MRSA PCR: negative  Thank you for allowing pharmacy to be a part of this patient's care.  [name] 10/06/2018 8:31 AM

## 2018-10-07 DIAGNOSIS — N186 End stage renal disease: Secondary | ICD-10-CM

## 2018-10-07 DIAGNOSIS — L97509 Non-pressure chronic ulcer of other part of unspecified foot with unspecified severity: Secondary | ICD-10-CM

## 2018-10-07 DIAGNOSIS — Z992 Dependence on renal dialysis: Secondary | ICD-10-CM

## 2018-10-07 DIAGNOSIS — D631 Anemia in chronic kidney disease: Secondary | ICD-10-CM

## 2018-10-07 DIAGNOSIS — Z794 Long term (current) use of insulin: Secondary | ICD-10-CM

## 2018-10-07 DIAGNOSIS — E11621 Type 2 diabetes mellitus with foot ulcer: Secondary | ICD-10-CM

## 2018-10-07 DIAGNOSIS — T82898A Other specified complication of vascular prosthetic devices, implants and grafts, initial encounter: Secondary | ICD-10-CM

## 2018-10-07 LAB — CBC
HCT: 30.9 % — ABNORMAL LOW (ref 39.0–52.0)
Hemoglobin: 9.9 g/dL — ABNORMAL LOW (ref 13.0–17.0)
MCH: 34.1 pg — ABNORMAL HIGH (ref 26.0–34.0)
MCHC: 32 g/dL (ref 30.0–36.0)
MCV: 106.6 fL — ABNORMAL HIGH (ref 80.0–100.0)
Platelets: 227 10*3/uL (ref 150–400)
RBC: 2.9 MIL/uL — ABNORMAL LOW (ref 4.22–5.81)
RDW: 12.8 % (ref 11.5–15.5)
WBC: 8.5 10*3/uL (ref 4.0–10.5)
nRBC: 0 % (ref 0.0–0.2)

## 2018-10-07 LAB — BASIC METABOLIC PANEL
Anion gap: 13 (ref 5–15)
BUN: 35 mg/dL — ABNORMAL HIGH (ref 6–20)
CO2: 25 mmol/L (ref 22–32)
Calcium: 8.1 mg/dL — ABNORMAL LOW (ref 8.9–10.3)
Chloride: 98 mmol/L (ref 98–111)
Creatinine, Ser: 6.06 mg/dL — ABNORMAL HIGH (ref 0.61–1.24)
GFR calc Af Amer: 12 mL/min — ABNORMAL LOW (ref >60)
GFR calc non Af Amer: 10 mL/min — ABNORMAL LOW (ref >60)
Glucose, Bld: 71 mg/dL (ref 70–99)
Potassium: 6.3 mmol/L (ref 3.5–5.1)
Sodium: 136 mmol/L (ref 135–145)

## 2018-10-07 LAB — GLUCOSE, CAPILLARY
Glucose-Capillary: 65 mg/dL — ABNORMAL LOW (ref 70–99)
Glucose-Capillary: 83 mg/dL (ref 70–99)
Glucose-Capillary: 80 mg/dL (ref 70–99)
Glucose-Capillary: 76 mg/dL (ref 70–99)
Glucose-Capillary: 78 mg/dL (ref 70–99)

## 2018-10-07 LAB — HEPATITIS B SURFACE ANTIGEN: Hepatitis B Surface Ag: NEGATIVE

## 2018-10-07 MED ORDER — SODIUM CHLORIDE 0.9 % IV SOLN: 100.0000 mL | INTRAVENOUS | Status: DC | PRN

## 2018-10-07 MED ORDER — SODIUM CHLORIDE 0.9 % IV SOLN
INTRAVENOUS | Status: DC | PRN
  Administered 2018-10-06: via INTRAVENOUS

## 2018-10-07 MED ORDER — HEPARIN SODIUM (PORCINE) 1000 UNIT/ML DIALYSIS: 1000.0000 [IU] | INTRAMUSCULAR | Status: DC | PRN

## 2018-10-07 MED ORDER — OXYCODONE HCL 5 MG PO TABS
5.0000 mg | ORAL_TABLET | Freq: Once | ORAL | Status: AC
  Administered 2018-10-07: 5 mg via ORAL
  Filled 2018-10-07: qty 1

## 2018-10-07 MED ORDER — ALTEPLASE 2 MG IJ SOLR: 2.0000 mg | Freq: Once | INTRAMUSCULAR | Status: DC | PRN

## 2018-10-07 NOTE — Progress Notes (Signed)
Patient ID: Brendan Campbell, male   DOB: 16-Jan-1971, 48 y.o.   MRN: [id number]  PROGRESS NOTE    AHSIR SIMA  [id number] DOB: 1970/02/13 DOA: 10/05/2018 PCP: System, Pcp Not In   Brief Narrative:  48 year old male with history of end-stage renal disease on hemodialysis, diabetes mellitus type 2, CHF, anemia of chronic disease, hyperlipidemia, status post right BKA presented with hyperkalemia at dialysis with potassium reported as 8.  Nephrology recommended transfer to [facility] for vascular surgery evaluation.  Assessment & Plan:   Severe hyperkalemia with life-threatening EKG changes -Most likely secondary to hemodialysis noncompliance -Improved and stabilized -Potassium 6.3 this morning.  Nephrology aware.  Patient is going to have dialysis today.    End-stage renal disease on hemodialysis Probable AV fistula malfunction -Nephrology following.  Dialysis as per nephrology schedule.  Patient had left femoral HD catheter placement by general surgery on 10/05/2018.  Vascular surgery evaluation has been requested.  Left-sided dental abscess -Continue Unasyn for now.  Switch to oral Augmentin upon discharge and will need dentist evaluation as an outpatient  Generalized weakness--from hyperkalemia.  Improving.  PT evaluation.  No need for MRI at this time.  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  Ambulates with a cane and uses prosthesis.  Outpatient follow-up  Anemia of chronic disease  -Most likely secondary to his renal failure.  Hemoglobin stable.  Monitor  Diabetes mellitus type 2 -Blood sugars on the lower side.  Discontinue Lantus.  Continue CBGs with SSI.  DVT prophylaxis: Heparin Code Status: Full Family Communication: None at bedside Disposition Plan: Depends on clinical outcome  Consultants: Nephrology/vascular surgery/general surgery  Procedures: Left femoral hemodialysis catheter placement on 10/05/2018 by general surgery  Antimicrobials: None    Subjective: Patient seen and examined at bedside.  Denies any overnight fever, nausea, vomiting.  Objective: Vitals:   10/06/18 1707 10/06/18 2209 10/07/18 0500 10/07/18 0547  BP: 108/71 115/85  125/78  Pulse: 84 88  90  Resp: 19 20  13   Temp: 97.7 F (36.5 C) 98 F (36.7 C)  97.7 F (36.5 C)  TempSrc:      SpO2: 97% 99%  95%  Weight:   86.7 kg   Height:        Intake/Output Summary (Last 24 hours) at 10/07/2018 1020 Last data filed at 10/06/2018 1400 Gross per 24 hour  Intake 158.55 ml  Output -  Net 158.55 ml   Filed Weights   10/05/18 2000 10/05/18 2110 10/07/18 0500  Weight: 87 kg 87 kg 86.7 kg    Examination:  General exam: No acute distress Respiratory system: Bilateral decreased breath sounds at bases with some scattered crackles Cardiovascular system: S1 & S2 heard, Rate controlled Gastrointestinal system: Abdomen is nondistended, soft and nontender. Normal bowel sounds heard. Extremities: No cyanosis, clubbing; trace left lower extremity edema; right BKA present    Data Reviewed: I have personally reviewed following labs and imaging studies  CBC: Recent Labs  Lab 10/05/18 1449 10/06/18 0423 10/06/18 0720 10/07/18 0426  WBC 9.3 8.8 8.3 8.5  NEUTROABS  --   --  5.6  --   HGB 10.1* 9.3* 9.0* 9.9*  HCT 32.7* 29.0* 28.0* 30.9*  MCV 109.7* 107.0* 106.1* 106.6*  PLT 197 206 195 [id number]   Basic Metabolic Panel: Recent Labs  Lab 10/05/18 1603 10/05/18 2022 10/06/18 0720 10/07/18 0426  NA 139 138 137 136  K 7.2* 6.4* 5.3* 6.3*  CL 101 98 96* 98  CO2 21* 19* 29  25  GLUCOSE 40* 156* 84 71  BUN 79* 79* 24* 35*  CREATININE 10.73* 10.44* 4.61* 6.06*  CALCIUM 8.3* 8.4* 8.1* 8.1*   GFR: Estimated Creatinine Clearance: 16.4 mL/min (A) (by C-G formula based on SCr of 6.06 mg/dL (H)). Liver Function Tests: Recent Labs  Lab 10/05/18 1603 10/05/18 2022  AST 33 32  ALT 38 34  ALKPHOS 112 103  BILITOT 0.9 0.7  PROT 7.8 7.0  ALBUMIN 4.0 3.6   Recent  Labs  Lab 10/05/18 1449  LIPASE 34   No results for input(s): AMMONIA in the last 168 hours. Coagulation Profile: No results for input(s): INR, PROTIME in the last 168 hours. Cardiac Enzymes: No results for input(s): CKTOTAL, CKMB, CKMBINDEX, TROPONINI in the last 168 hours. BNP (last 3 results) No results for input(s): PROBNP in the last 8760 hours. HbA1C: Recent Labs    10/05/18 1617  HGBA1C 5.2   CBG: Recent Labs  Lab 10/06/18 1111 10/06/18 1651 10/06/18 2207 10/07/18 0835 10/07/18 0911  GLUCAP 89 70 80 65* 83   Lipid Profile: No results for input(s): CHOL, HDL, LDLCALC, TRIG, CHOLHDL, LDLDIRECT in the last 72 hours. Thyroid Function Tests: No results for input(s): TSH, T4TOTAL, FREET4, T3FREE, THYROIDAB in the last 72 hours. Anemia Panel: No results for input(s): VITAMINB12, FOLATE, FERRITIN, TIBC, IRON, RETICCTPCT in the last 72 hours. Sepsis Labs: No results for input(s): PROCALCITON, LATICACIDVEN in the last 168 hours.  Recent Results (from the past 240 hour(s))  SARS Coronavirus 2 [facility] order, Performed in [facility] hospital lab) Nasopharyngeal Nasopharyngeal Swab     Status: None   Collection Time: 10/05/18  1:43 PM   Specimen: Nasopharyngeal Swab  Result Value Ref Range Status   SARS Coronavirus 2 NEGATIVE NEGATIVE Final    Comment: (NOTE) If result is NEGATIVE SARS-CoV-2 target nucleic acids are NOT DETECTED. The SARS-CoV-2 RNA is generally detectable in upper and lower  respiratory specimens during the acute phase of infection. The lowest  concentration of SARS-CoV-2 viral copies this assay can detect is 250  copies / mL. A negative result does not preclude SARS-CoV-2 infection  and should not be used as the sole basis for treatment or other  patient management decisions.  A negative result may occur with  improper specimen collection / handling, submission of specimen other  than nasopharyngeal swab, presence of viral mutation(s) within the   areas targeted by this assay, and inadequate number of viral copies  (<250 copies / mL). A negative result must be combined with clinical  observations, patient history, and epidemiological information. If result is POSITIVE SARS-CoV-2 target nucleic acids are DETECTED. The SARS-CoV-2 RNA is generally detectable in upper and lower  respiratory specimens dur ing the acute phase of infection.  Positive  results are indicative of active infection with SARS-CoV-2.  Clinical  correlation with patient history and other diagnostic information is  necessary to determine patient infection status.  Positive results do  not rule out bacterial infection or co-infection with other viruses. If result is PRESUMPTIVE POSTIVE SARS-CoV-2 nucleic acids MAY BE PRESENT.   A presumptive positive result was obtained on the submitted specimen  and confirmed on repeat testing.  While 2019 novel coronavirus  (SARS-CoV-2) nucleic acids may be present in the submitted sample  additional confirmatory testing may be necessary for epidemiological  and / or clinical management purposes  to differentiate between  SARS-CoV-2 and other Sarbecovirus currently known to infect humans.  If clinically indicated additional testing with an alternate  test  methodology [phone]) is advised. The SARS-CoV-2 RNA is generally  detectable in upper and lower respiratory sp ecimens during the acute  phase of infection. The expected result is Negative. Fact Sheet for Patients:  StrictlyIdeas.no Fact Sheet for Healthcare Providers: BankingDealers.co.za This test is not yet approved or cleared by the [place] FDA and has been authorized for detection and/or diagnosis of SARS-CoV-2 by FDA under an Emergency Use Authorization (EUA).  This EUA will remain in effect (meaning this test can be used) for the duration of the COVID-19 declaration under Section 564(b)(1) of the Act, 21 U.S.C.  section 360bbb-3(b)(1), unless the authorization is terminated or revoked sooner. Performed at [facility], [address], [place], [place] [address]   MRSA PCR Screening     Status: None   Collection Time: 10/05/18  7:17 PM   Specimen: Nasopharyngeal  Result Value Ref Range Status   MRSA by PCR NEGATIVE NEGATIVE Final    Comment:        The GeneXpert MRSA Assay (FDA approved for NASAL specimens only), is one component of a comprehensive MRSA colonization surveillance program. It is not intended to diagnose MRSA infection nor to guide or monitor treatment for MRSA infections. Performed at [facility], [address], [place], [place] [address]          Radiology Studies: Ct Head Wo Contrast  Result Date: 10/05/2018 CLINICAL DATA:  Paresthesia. EXAM: CT HEAD WITHOUT CONTRAST TECHNIQUE: Contiguous axial images were obtained from the base of the skull through the vertex without intravenous contrast. COMPARISON:  None. FINDINGS: Brain: Mild chronic ischemic white matter disease is noted. No mass effect or midline shift is noted. Ventricular size is within normal limits. There is no evidence of mass lesion, hemorrhage or acute infarction. Vascular: No hyperdense vessel or unexpected calcification. Skull: Normal. Negative for fracture or focal lesion. Sinuses/Orbits: No acute finding. Other: None. IMPRESSION: Mild chronic ischemic white matter disease. No acute intracranial abnormality seen. Electronically Signed   By: Marijo Conception M.D.   On: 10/05/2018 15:34   Dg Chest Portable 1 View  Result Date: 10/05/2018 CLINICAL DATA:  Shortness of breath.  The patient missed dialysis. EXAM: PORTABLE CHEST 1 VIEW COMPARISON:  April 24, 2018 FINDINGS: Stable cardiomegaly. The hila and mediastinum are unchanged. No pneumothorax. No nodules or masses. No focal infiltrates. No overt edema. Mild pulmonary venous congestion suspected. IMPRESSION: Cardiomegaly and mild pulmonary venous congestion.   No overt edema. Electronically Signed   By: Dorise Bullion III M.D   On: 10/05/2018 12:40        Scheduled Meds: . Chlorhexidine Gluconate Cloth  6 each Topical Q0600  . heparin  3,100 Units Intracatheter Once  . heparin  5,000 Units Subcutaneous Q8H  . insulin aspart  0-5 Units Subcutaneous QHS  . insulin aspart  0-9 Units Subcutaneous TID WC  . insulin glargine  10 Units Subcutaneous Q2200  . multivitamin  1 tablet Oral Daily   Continuous Infusions: . sodium chloride    . sodium chloride Stopped (10/07/18 0127)  . ampicillin-sulbactam (UNASYN) IV 3 g (10/07/18 0933)     LOS: 2 days        Aline August, MD [facility] 10/07/2018, 10:20 AM

## 2018-10-07 NOTE — Progress Notes (Signed)
Patient being transported to dialysis at this time.  Hiram Comber, RN 10/07/2018 6:57 PM

## 2018-10-07 NOTE — Progress Notes (Signed)
Received report at 2219 from [facility] in HD. Informed patient treatment ran for 3 hours, removed 3 L, tolerated it well, and last BP145/78. No meds given. Patient returned from HD at approx 2245.   Patient complaining of EJ IV burning during IV ABX infusion. Site flushed and rate reduced to 100 with relief. Paused infusion at 0130, IV team consult at [place]. No blood return upon flushing. Spoke with IV team Lattie Haw in person at [phone]; will attempt another IV site. New peripheral IV site established.  Patient stated began itching after bathing yesterday 8/30 with CHG wipes; no redness nor rash observed on skin. Informed day RN.

## 2018-10-07 NOTE — H&P (View-Only) (Signed)
Patient name: Brendan Campbell MRN: [id number] DOB: 1970-06-18 Sex: male  REASON FOR CONSULT:   Problems with left upper arm AV graft. The consult is requested by Dr. Joelyn Oms.  HPI:   Brendan Campbell is a pleasant 48 y.o. male who was admitted 2 days ago with hyperkalemia and weakness.  He normally dialyzes on Monday Wednesdays and Fridays.  He went for dialysis Monday and states that his upper arm graft on the left worked fine.  He missed dialysis on Wednesday.  Apparently he overslept.  When he went on Friday they had problems accessing the graft.  He states they were cannulating the graft fairly close to the axilla which is not where they typically cannulate the graft.  Given his issues with hyperkalemia a temporary dialysis catheter was placed in his left groin and he is using this for dialysis.  We were asked to evaluate his graft.  In reviewing his records it looks like he had a difficult time cannulating the more central portion of the fistula at his last dialysis treatment he may have had an infiltrate.  He therefore had a temporary femoral catheter placed on the left to complete his dialysis.  His graft was placed approximately 1 year ago.  This is a left upper arm brachial artery to axillary vein graft.  Is a 4 to 7 mm PTFE graft.  Currently the patient has no specific complaints.  His weakness has improved.  Of note he is on Unasyn for a dental abscess.   Current Facility-Administered Medications  Medication Dose Route Frequency Provider Last Rate Last Dose  . 0.9 %  sodium chloride infusion  100 mL Intravenous PRN Pearson Grippe B, MD      . 0.9 %  sodium chloride infusion   Intravenous PRN Manuella Ghazi, Pratik D, DO   Stopped at 10/07/18 0127  . alum & mag hydroxide-simeth (MAALOX/MYLANTA) 200-200-20 MG/5ML suspension 15 mL  15 mL Oral Q6H PRN Truett Mainland, DO   15 mL at 10/06/18 0152  . Ampicillin-Sulbactam (UNASYN) 3 g in sodium chloride 0.9 % 100 mL IVPB  3 g Intravenous Q12H Shah,  Pratik D, DO 200 mL/hr at 10/07/18 0034    . Chlorhexidine Gluconate Cloth 2 % PADS 6 each  6 each Topical Q0600 Truett Mainland, DO   6 each at 10/07/18 0419  . heparin injection 3,100 Units  3,100 Units Intracatheter Once Pearson Grippe B, MD      . heparin injection 5,000 Units  5,000 Units Subcutaneous Q8H Truett Mainland, DO   5,000 Units at 10/07/18 0530  . HYDROcodone-acetaminophen (NORCO/VICODIN) 5-325 MG per tablet 1 tablet  1 tablet Oral Q6H PRN Manuella Ghazi, Pratik D, DO   1 tablet at 10/07/18 0029  . insulin aspart (novoLOG) injection 0-5 Units  0-5 Units Subcutaneous QHS Stinson, Jacob J, DO      . insulin aspart (novoLOG) injection 0-9 Units  0-9 Units Subcutaneous TID WC Truett Mainland, DO      . insulin glargine (LANTUS) injection 10 Units  10 Units Subcutaneous Q2200 Truett Mainland, DO   Stopped at 10/06/18 2258  . lidocaine (PF) (XYLOCAINE) 1 % injection 5 mL  5 mL Intradermal PRN [name], MD      . lidocaine-prilocaine (EMLA) cream 1 application  1 application Topical PRN [name], MD      . multivitamin (RENA-VIT) tablet 1 tablet  1 tablet Oral Daily Truett Mainland, DO   1  tablet at 10/06/18 1005  . ondansetron (ZOFRAN) tablet 4 mg  4 mg Oral Q6H PRN Truett Mainland, DO       Or  . ondansetron [facility]) injection 4 mg  4 mg Intravenous Q6H PRN Stinson, Jacob J, DO      . pentafluoroprop-tetrafluoroeth (GEBAUERS) aerosol 1 application  1 application Topical PRN Pearson Grippe B, MD      . polyethylene glycol (MIRALAX / GLYCOLAX) packet 17 g  17 g Oral Daily PRN Truett Mainland, DO        REVIEW OF SYSTEMS:  [X]  denotes positive finding, [ ]  denotes negative finding Vascular    Leg swelling    Cardiac    Chest pain or chest pressure:    Shortness of breath upon exertion:    Short of breath when lying flat:    Irregular heart rhythm:    Constitutional    Fever or chills:     PHYSICAL EXAM:   Vitals:   10/06/18 1707 10/06/18 2209 10/07/18 0500 10/07/18  0547  BP: 108/71 115/85  125/78  Pulse: 84 88  90  Resp: 19 20  13   Temp: 97.7 F (36.5 C) 98 F (36.7 C)  97.7 F (36.5 C)  TempSrc:      SpO2: 97% 99%  95%  Weight:   86.7 kg   Height:        GENERAL: The patient is a well-nourished male, in no acute distress. The vital signs are documented above. CARDIOVASCULAR: There is a regular rate and rhythm. PULMONARY: There is good air exchange bilaterally without wheezing or rales. VASCULAR: His upper arm graft on the left has a palpable thrill and is not especially pulsatile.  Therefore I would not suspect an outflow stenosis.  He does have induration around the central portion of his graft suggesting a new or remote infiltrate in this area.  He has a diminished left radial pulse.  DATA:   Potassium is 5.3.  MEDICAL ISSUES:   DIFFICULTY ACCESSING LEFT UPPER ARM GRAFT: The graft does have an excellent thrill and I think that it would be safe to try to cannulate the graft again staying away from the upper half of the graft.  If he still has problems and I think we could proceed with a fistulogram.  Tomorrow schedule is full and there is only a small chance we could do it tomorrow.  Otherwise we will plan on doing a Tuesday if he is having problems with his graft still  Deitra Mayo [facility] [phone]

## 2018-10-07 NOTE — Progress Notes (Signed)
Admit: 10/05/2018 LOS: 2  23M ESRD with Hyperkalemia and EKG Changes  Subjective:  . At [facility] . No c/o . K 6.3 this AM . On Unasyn for dental abscess . BP stable on RA  08/29 0701 - 08/30 0700 In: 398.6 [P.O.:240; I.V.:58.6; IV Piggyback:100] Out: -   Filed Weights   10/05/18 2000 10/05/18 2110 10/07/18 0500  Weight: 87 kg 87 kg 86.7 kg    Scheduled Meds: . Chlorhexidine Gluconate Cloth  6 each Topical Q0600  . heparin  3,100 Units Intracatheter Once  . heparin  5,000 Units Subcutaneous Q8H  . insulin aspart  0-5 Units Subcutaneous QHS  . insulin aspart  0-9 Units Subcutaneous TID WC  . insulin glargine  10 Units Subcutaneous Q2200  . multivitamin  1 tablet Oral Daily   Continuous Infusions: . sodium chloride    . sodium chloride Stopped (10/07/18 0127)  . ampicillin-sulbactam (UNASYN) IV 3 g (10/07/18 0933)   PRN Meds:.sodium chloride, sodium chloride, alum & mag hydroxide-simeth, HYDROcodone-acetaminophen, lidocaine (PF), lidocaine-prilocaine, ondansetron **OR** ondansetron (ZOFRAN) IV, pentafluoroprop-tetrafluoroeth, polyethylene glycol  Current Labs: reviewed    Physical Exam:  Blood pressure 125/78, pulse 90, temperature 97.7 F (36.5 C), resp. rate 13, height 5\' 9"  (1.753 m), weight 86.7 kg, SpO2 95 %. GEN: Chronically ill-appearing, somnolent, interactive ENT: Poor dentition EYES: EOMI CV: RRR, normal S1-S2, no rub PULM: Clear bilaterally ABD: Soft, nontender SKIN: No rashes or lesions EXT: No edema Left upper extremity AV fistula with bruit and thrill present  A 1. ESRD, noncompliant (does got on Wednesday), MWF [facility] LUE AVF 2. Suspect recirculation of AVF, likely sig venous stenosis, could not complete HD 8/28 req Temp Fem HD Cath 3. Hyperkalemia, severe and life threatening with EKG changes, recurrent 8/30 4. DM2 5. Dental abscess on unasyn 6. Anemia, Hb stable in 9s  P . HD today, reattempt cannulation of AVF;2K, 3h . Ask [facility] to eval,  suspect he needs Fgram,  . Medication Issues; o Preferred narcotic agents for pain control are hydromorphone, fentanyl, and methadone. Morphine should not be used.  o Baclofen should be avoided o Avoid oral sodium phosphate and magnesium citrate based laxatives / bowel preps    Pearson Grippe MD 10/07/2018, 10:28 AM  Recent Labs  Lab 10/05/18 2022 10/06/18 0720 10/07/18 0426  NA 138 137 136  K 6.4* 5.3* 6.3*  CL 98 96* 98  CO2 19* 29 25  GLUCOSE 156* 84 71  BUN 79* 24* 35*  CREATININE 10.44* 4.61* 6.06*  CALCIUM 8.4* 8.1* 8.1*   Recent Labs  Lab 10/06/18 0423 10/06/18 0720 10/07/18 0426  WBC 8.8 8.3 8.5  NEUTROABS  --  5.6  --   HGB 9.3* 9.0* 9.9*  HCT 29.0* 28.0* 30.9*  MCV 107.0* 106.1* 106.6*  PLT 206 195 227

## 2018-10-07 NOTE — Progress Notes (Signed)
Hypoglycemic Event  CBG: 65  Treatment: 4 oz juice/soda  Symptoms: None  Follow-up CBG: Time:  CBG Result: 83  Possible Reasons for Event: Inadequate meal intake  Comments: assisted patient in ordering breakfast tray and educated on importance of eating following hypoglycemic event.     Hiram Comber, RN 10/07/2018 9:29 AM

## 2018-10-07 NOTE — Progress Notes (Signed)
Patient's blood sugar is 76; currently eating dinner tray. Will continue to monitor.  Hiram Comber, RN 10/07/2018 5:30 PM

## 2018-10-07 NOTE — Progress Notes (Deleted)
Hypoglycemic Event  CBG: 65  Treatment: 4 oz juice/soda  Symptoms: None  Follow-up CBG: Time: 0911 CBG Result: 83  Possible Reasons for Event: Inadequate meal intake  Comments: assisted patient in ordering breakfast tray and educated on importance of eating following hypoglycemic event.     Hiram Comber, RN 10/07/2018 9:30 AM

## 2018-10-07 NOTE — Progress Notes (Signed)
Page NP Bodenheimer at [phone]. Notifed by lab at 0523 patient's K+ is 6.3 (not hemolyzed).  Paged NP Bodenheimer at [phone]. Pt. 7/10 pain left jaw abscess and left groin at HD cath site with movement. Next PRN Vicodin not due until 0630. Spoke with NP Bodenheimer informed will order oxycodone and inquired about any EKG rhythm changes and informed not aware of any and no notifications from telemetry.  Spoke with tele at [phone] to inquire about any EKG rhythm changes. Informed STelevation 2.1 in MCL lead at 0551 & 1st degree; NS all night. Notified NP Bodenheimer at [place].   Paged NP Bodenheimer at [phone]. EKG completed. NS rhythm, left vent hypertrophy with QRS widening, lateral & inferior infarct (age undetermined).

## 2018-10-07 NOTE — Consult Note (Signed)
Patient name: Brendan Campbell MRN: [id number] DOB: [birth year] Sex: male  REASON FOR CONSULT:   Problems with left upper arm AV graft. The consult is requested by Dr. Joelyn Oms.  HPI:   Brendan Campbell is a pleasant 48 y.o. male who was admitted 2 days ago with hyperkalemia and weakness.  He normally dialyzes on Monday Wednesdays and Fridays.  He went for dialysis Monday and states that his upper arm graft on the left worked fine.  He missed dialysis on Wednesday.  Apparently he overslept.  When he went on Friday they had problems accessing the graft.  He states they were cannulating the graft fairly close to the axilla which is not where they typically cannulate the graft.  Given his issues with hyperkalemia a temporary dialysis catheter was placed in his left groin and he is using this for dialysis.  We were asked to evaluate his graft.  In reviewing his records it looks like he had a difficult time cannulating the more central portion of the fistula at his last dialysis treatment he may have had an infiltrate.  He therefore had a temporary femoral catheter placed on the left to complete his dialysis.  His graft was placed approximately 1 year ago.  This is a left upper arm brachial artery to axillary vein graft.  Is a 4 to 7 mm PTFE graft.  Currently the patient has no specific complaints.  His weakness has improved.  Of note he is on Unasyn for a dental abscess.   Current Facility-Administered Medications  Medication Dose Route Frequency Provider Last Rate Last Dose  . 0.9 %  sodium chloride infusion  100 mL Intravenous PRN Pearson Grippe B, MD      . 0.9 %  sodium chloride infusion   Intravenous PRN Manuella Ghazi, Pratik D, DO   Stopped at 10/07/18 0127  . alum & mag hydroxide-simeth (MAALOX/MYLANTA) 200-200-20 MG/5ML suspension 15 mL  15 mL Oral Q6H PRN Truett Mainland, DO   15 mL at 10/06/18 0152  . Ampicillin-Sulbactam (UNASYN) 3 g in sodium chloride 0.9 % 100 mL IVPB  3 g Intravenous Q12H Shah,  Pratik D, DO 200 mL/hr at 10/07/18 0034    . Chlorhexidine Gluconate Cloth 2 % PADS 6 each  6 each Topical Q0600 Truett Mainland, DO   6 each at 10/07/18 0419  . heparin injection 3,100 Units  3,100 Units Intracatheter Once Pearson Grippe B, MD      . heparin injection 5,000 Units  5,000 Units Subcutaneous Q8H Truett Mainland, DO   5,000 Units at 10/07/18 0530  . HYDROcodone-acetaminophen (NORCO/VICODIN) 5-325 MG per tablet 1 tablet  1 tablet Oral Q6H PRN Manuella Ghazi, Pratik D, DO   1 tablet at 10/07/18 0029  . insulin aspart (novoLOG) injection 0-5 Units  0-5 Units Subcutaneous QHS Stinson, Jacob J, DO      . insulin aspart (novoLOG) injection 0-9 Units  0-9 Units Subcutaneous TID WC Truett Mainland, DO      . insulin glargine (LANTUS) injection 10 Units  10 Units Subcutaneous Q2200 Truett Mainland, DO   Stopped at 10/06/18 2258  . lidocaine (PF) (XYLOCAINE) 1 % injection 5 mL  5 mL Intradermal PRN [name], MD      . lidocaine-prilocaine (EMLA) cream 1 application  1 application Topical PRN [name], MD      . multivitamin (RENA-VIT) tablet 1 tablet  1 tablet Oral Daily Truett Mainland, DO   1  tablet at 10/06/18 1005  . ondansetron (ZOFRAN) tablet 4 mg  4 mg Oral Q6H PRN Truett Mainland, DO       Or  . ondansetron [facility]) injection 4 mg  4 mg Intravenous Q6H PRN Stinson, Jacob J, DO      . pentafluoroprop-tetrafluoroeth (GEBAUERS) aerosol 1 application  1 application Topical PRN Pearson Grippe B, MD      . polyethylene glycol (MIRALAX / GLYCOLAX) packet 17 g  17 g Oral Daily PRN Truett Mainland, DO        REVIEW OF SYSTEMS:  [X]  denotes positive finding, [ ]  denotes negative finding Vascular    Leg swelling    Cardiac    Chest pain or chest pressure:    Shortness of breath upon exertion:    Short of breath when lying flat:    Irregular heart rhythm:    Constitutional    Fever or chills:     PHYSICAL EXAM:   Vitals:   10/06/18 1707 10/06/18 2209 10/07/18 0500 10/07/18  0547  BP: 108/71 115/85  125/78  Pulse: 84 88  90  Resp: 19 20  13   Temp: 97.7 F (36.5 C) 98 F (36.7 C)  97.7 F (36.5 C)  TempSrc:      SpO2: 97% 99%  95%  Weight:   86.7 kg   Height:        GENERAL: The patient is a well-nourished male, in no acute distress. The vital signs are documented above. CARDIOVASCULAR: There is a regular rate and rhythm. PULMONARY: There is good air exchange bilaterally without wheezing or rales. VASCULAR: His upper arm graft on the left has a palpable thrill and is not especially pulsatile.  Therefore I would not suspect an outflow stenosis.  He does have induration around the central portion of his graft suggesting a new or remote infiltrate in this area.  He has a diminished left radial pulse.  DATA:   Potassium is 5.3.  MEDICAL ISSUES:   DIFFICULTY ACCESSING LEFT UPPER ARM GRAFT: The graft does have an excellent thrill and I think that it would be safe to try to cannulate the graft again staying away from the upper half of the graft.  If he still has problems and I think we could proceed with a fistulogram.  Tomorrow schedule is full and there is only a small chance we could do it tomorrow.  Otherwise we will plan on doing a Tuesday if he is having problems with his graft still  Deitra Mayo [facility] [phone]

## 2018-10-08 DIAGNOSIS — R531 Weakness: Secondary | ICD-10-CM

## 2018-10-08 LAB — CBC WITH DIFFERENTIAL/PLATELET
Abs Immature Granulocytes: 0.02 10*3/uL (ref 0.00–0.07)
Basophils Absolute: 0.1 10*3/uL (ref 0.0–0.1)
Basophils Relative: 1 %
Eosinophils Absolute: 0.4 10*3/uL (ref 0.0–0.5)
Eosinophils Relative: 6 %
HCT: 31.5 % — ABNORMAL LOW (ref 39.0–52.0)
Hemoglobin: 10.1 g/dL — ABNORMAL LOW (ref 13.0–17.0)
Immature Granulocytes: 0 %
Lymphocytes Relative: 22 %
Lymphs Abs: 1.5 10*3/uL (ref 0.7–4.0)
MCH: 33.4 pg (ref 26.0–34.0)
MCHC: 32.1 g/dL (ref 30.0–36.0)
MCV: 104.3 fL — ABNORMAL HIGH (ref 80.0–100.0)
Monocytes Absolute: 0.4 10*3/uL (ref 0.1–1.0)
Monocytes Relative: 6 %
Neutro Abs: 4.2 10*3/uL (ref 1.7–7.7)
Neutrophils Relative %: 65 %
Platelets: 226 10*3/uL (ref 150–400)
RBC: 3.02 MIL/uL — ABNORMAL LOW (ref 4.22–5.81)
RDW: 12.6 % (ref 11.5–15.5)
WBC: 6.5 10*3/uL (ref 4.0–10.5)
nRBC: 0 % (ref 0.0–0.2)

## 2018-10-08 LAB — BASIC METABOLIC PANEL
Anion gap: 11 (ref 5–15)
BUN: 22 mg/dL — ABNORMAL HIGH (ref 6–20)
CO2: 27 mmol/L (ref 22–32)
Calcium: 8.8 mg/dL — ABNORMAL LOW (ref 8.9–10.3)
Chloride: 99 mmol/L (ref 98–111)
Creatinine, Ser: 4.84 mg/dL — ABNORMAL HIGH (ref 0.61–1.24)
GFR calc Af Amer: 15 mL/min — ABNORMAL LOW (ref >60)
GFR calc non Af Amer: 13 mL/min — ABNORMAL LOW (ref >60)
Glucose, Bld: 106 mg/dL — ABNORMAL HIGH (ref 70–99)
Potassium: 4.9 mmol/L (ref 3.5–5.1)
Sodium: 137 mmol/L (ref 135–145)

## 2018-10-08 LAB — GLUCOSE, CAPILLARY
Glucose-Capillary: 83 mg/dL (ref 70–99)
Glucose-Capillary: 82 mg/dL (ref 70–99)
Glucose-Capillary: 71 mg/dL (ref 70–99)
Glucose-Capillary: 81 mg/dL (ref 70–99)
Glucose-Capillary: 89 mg/dL (ref 70–99)

## 2018-10-08 LAB — MAGNESIUM: Magnesium: 2 mg/dL (ref 1.7–2.4)

## 2018-10-08 LAB — PHOSPHORUS: Phosphorus: 4.6 mg/dL (ref 2.5–4.6)

## 2018-10-08 MED ORDER — LIDOCAINE-PRILOCAINE 2.5-2.5 % EX CREA: 1.0000 "application " | TOPICAL_CREAM | CUTANEOUS | Status: DC | PRN

## 2018-10-08 MED ORDER — HEPARIN SODIUM (PORCINE) 1000 UNIT/ML DIALYSIS: 1000.0000 [IU] | INTRAMUSCULAR | Status: DC | PRN

## 2018-10-08 MED ORDER — SODIUM CHLORIDE 0.9 % IV SOLN
1.5000 g | Freq: Two times a day (BID) | INTRAVENOUS | Status: DC
  Administered 2018-10-09: 1.5 g via INTRAVENOUS
  Filled 2018-10-08: qty 1.5
  Filled 2018-10-08 (×2): qty 4

## 2018-10-08 MED ORDER — LORATADINE 10 MG PO TABS
10.0000 mg | ORAL_TABLET | Freq: Every day | ORAL | Status: DC | PRN
  Administered 2018-10-08: 10 mg via ORAL
  Filled 2018-10-08: qty 1

## 2018-10-08 MED ORDER — PENTAFLUOROPROP-TETRAFLUOROETH EX AERO: 1.0000 "application " | INHALATION_SPRAY | CUTANEOUS | Status: DC | PRN

## 2018-10-08 MED ORDER — SODIUM CHLORIDE 0.9 % IV SOLN: 100.0000 mL | INTRAVENOUS | Status: DC | PRN

## 2018-10-08 MED ORDER — CHLORHEXIDINE GLUCONATE CLOTH 2 % EX PADS
6.0000 | MEDICATED_PAD | Freq: Every day | CUTANEOUS | Status: DC
  Administered 2018-10-09: 6 via TOPICAL

## 2018-10-08 MED ORDER — LIDOCAINE HCL (PF) 1 % IJ SOLN: 5.0000 mL | INTRAMUSCULAR | Status: DC | PRN

## 2018-10-08 MED ORDER — ALTEPLASE 2 MG IJ SOLR: 2.0000 mg | Freq: Once | INTRAMUSCULAR | Status: DC | PRN

## 2018-10-08 NOTE — Progress Notes (Signed)
Patient ID: Brendan Campbell, male   DOB: May 28, 1970, 48 y.o.   MRN: [id number]  PROGRESS NOTE    Brendan Campbell  [id number] DOB: April 25, 1970 DOA: 10/05/2018 PCP: System, Pcp Not In   Brief Narrative:  48 year old male with history of end-stage renal disease on hemodialysis, diabetes mellitus type 2, CHF, anemia of chronic disease, hyperlipidemia, status post right BKA presented with hyperkalemia at dialysis with potassium reported as 8.  Nephrology recommended transfer to [facility] for vascular surgery evaluation.  Assessment & Plan:   Severe hyperkalemia with life-threatening EKG changes -Most likely secondary to hemodialysis noncompliance -Improved and stabilized -Potassium 4.3 this morning.  End-stage renal disease on hemodialysis Probable AV fistula malfunction -Nephrology following.  Dialysis as per nephrology schedule.  Patient had left femoral HD catheter placement by general surgery on 10/05/2018.  Vascular surgery has been consulted: Planning for?  Fistulogram tomorrow.  Left-sided dental abscess -Continue Unasyn for now.  Switch to oral Augmentin upon discharge and will need dentist evaluation as an outpatient  Generalized weakness--from hyperkalemia.  Improving.  PT evaluation.  No need for MRI at this time.  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  Ambulates with a cane and uses prosthesis.  Outpatient follow-up  Anemia of chronic disease  -Most likely secondary to his renal failure.  Hemoglobin stable.  Monitor  Diabetes mellitus type 2 with hypoglycemia -Blood sugars on the lower side.  Discontinue Lantus.  Continue CBGs with SSI.  DVT prophylaxis: Heparin Code Status: Full Family Communication: None at bedside Disposition Plan: Home once cleared by nephrology/vascular surgery, most likely tomorrow  Consultants: Nephrology/vascular surgery/general surgery  Procedures: Left femoral hemodialysis catheter placement on 10/05/2018 by general surgery   Antimicrobials: None   Subjective: Patient seen and examined at bedside.  Poor historian.  Denies any overnight vomiting, fevers, shortness of breath.   Objective: Vitals:   10/07/18 2207 10/07/18 2219 10/07/18 2320 10/08/18 0553  BP: (!) 145/78  116/74 (!) 110/26  Pulse: 95  89 82  Resp: 17  16 20   Temp: 98.3 F (36.8 C)  98.1 F (36.7 C) 98.1 F (36.7 C)  TempSrc: Oral  Oral   SpO2: 98%  99% 97%  Weight:  84.2 kg    Height:        Intake/Output Summary (Last 24 hours) at 10/08/2018 0802 Last data filed at 10/07/2018 2207 Gross per 24 hour  Intake 309.23 ml  Output 3000 ml  Net -2690.77 ml   Filed Weights   10/07/18 0500 10/07/18 1852 10/07/18 2219  Weight: 86.7 kg 89.3 kg 84.2 kg    Examination:  General exam: No distress Respiratory system: Bilateral decreased breath sounds at bases with scattered rales.  No wheezing  cardiovascular system: Rate controlled Start Gastrointestinal system: Abdomen is nondistended, soft and nontender. Normal bowel sounds heard. Extremities: No cyanosis; trace left lower extremity edema; right BKA present    Data Reviewed: I have personally reviewed following labs and imaging studies  CBC: Recent Labs  Lab 10/05/18 1449 10/06/18 0423 10/06/18 0720 10/07/18 0426 10/08/18 0354  WBC 9.3 8.8 8.3 8.5 6.5  NEUTROABS  --   --  5.6  --  4.2  HGB 10.1* 9.3* 9.0* 9.9* 10.1*  HCT 32.7* 29.0* 28.0* 30.9* 31.5*  MCV 109.7* 107.0* 106.1* 106.6* 104.3*  PLT 197 206 195 227 [id number]   Basic Metabolic Panel: Recent Labs  Lab 10/05/18 1603 10/05/18 2022 10/06/18 0720 10/07/18 0426 10/08/18 0354  NA 139 138 137 136 137  K 7.2* 6.4* 5.3* 6.3* 4.9  CL 101 98 96* 98 99  CO2 21* 19* 29 25 27   GLUCOSE 40* 156* 84 71 106*  BUN 79* 79* 24* 35* 22*  CREATININE 10.73* 10.44* 4.61* 6.06* 4.84*  CALCIUM 8.3* 8.4* 8.1* 8.1* 8.8*  MG  --   --   --   --  2.0   GFR: Estimated Creatinine Clearance: 18.9 mL/min (A) (by C-G formula based on SCr of  4.84 mg/dL (H)). Liver Function Tests: Recent Labs  Lab 10/05/18 1603 10/05/18 2022  AST 33 32  ALT 38 34  ALKPHOS 112 103  BILITOT 0.9 0.7  PROT 7.8 7.0  ALBUMIN 4.0 3.6   Recent Labs  Lab 10/05/18 1449  LIPASE 34   No results for input(s): AMMONIA in the last 168 hours. Coagulation Profile: No results for input(s): INR, PROTIME in the last 168 hours. Cardiac Enzymes: No results for input(s): CKTOTAL, CKMB, CKMBINDEX, TROPONINI in the last 168 hours. BNP (last 3 results) No results for input(s): PROBNP in the last 8760 hours. HbA1C: Recent Labs    10/05/18 1617  HGBA1C 5.2   CBG: Recent Labs  Lab 10/07/18 0835 10/07/18 0911 10/07/18 1138 10/07/18 1715 10/07/18 2325  GLUCAP 65* 83 80 76 78   Lipid Profile: No results for input(s): CHOL, HDL, LDLCALC, TRIG, CHOLHDL, LDLDIRECT in the last 72 hours. Thyroid Function Tests: No results for input(s): TSH, T4TOTAL, FREET4, T3FREE, THYROIDAB in the last 72 hours. Anemia Panel: No results for input(s): VITAMINB12, FOLATE, FERRITIN, TIBC, IRON, RETICCTPCT in the last 72 hours. Sepsis Labs: No results for input(s): PROCALCITON, LATICACIDVEN in the last 168 hours.  Recent Results (from the past 240 hour(s))  SARS Coronavirus 2 [facility] order, Performed in [facility] hospital lab) Nasopharyngeal Nasopharyngeal Swab     Status: None   Collection Time: 10/05/18  1:43 PM   Specimen: Nasopharyngeal Swab  Result Value Ref Range Status   SARS Coronavirus 2 NEGATIVE NEGATIVE Final    Comment: (NOTE) If result is NEGATIVE SARS-CoV-2 target nucleic acids are NOT DETECTED. The SARS-CoV-2 RNA is generally detectable in upper and lower  respiratory specimens during the acute phase of infection. The lowest  concentration of SARS-CoV-2 viral copies this assay can detect is 250  copies / mL. A negative result does not preclude SARS-CoV-2 infection  and should not be used as the sole basis for treatment or other  patient  management decisions.  A negative result may occur with  improper specimen collection / handling, submission of specimen other  than nasopharyngeal swab, presence of viral mutation(s) within the  areas targeted by this assay, and inadequate number of viral copies  (<250 copies / mL). A negative result must be combined with clinical  observations, patient history, and epidemiological information. If result is POSITIVE SARS-CoV-2 target nucleic acids are DETECTED. The SARS-CoV-2 RNA is generally detectable in upper and lower  respiratory specimens dur ing the acute phase of infection.  Positive  results are indicative of active infection with SARS-CoV-2.  Clinical  correlation with patient history and other diagnostic information is  necessary to determine patient infection status.  Positive results do  not rule out bacterial infection or co-infection with other viruses. If result is PRESUMPTIVE POSTIVE SARS-CoV-2 nucleic acids MAY BE PRESENT.   A presumptive positive result was obtained on the submitted specimen  and confirmed on repeat testing.  While 2019 novel coronavirus  (SARS-CoV-2) nucleic acids may be present in the submitted sample  additional confirmatory testing may be necessary for epidemiological  and / or clinical management purposes  to differentiate between  SARS-CoV-2 and other Sarbecovirus currently known to infect humans.  If clinically indicated additional testing with an alternate test  methodology [phone]) is advised. The SARS-CoV-2 RNA is generally  detectable in upper and lower respiratory sp ecimens during the acute  phase of infection. The expected result is Negative. Fact Sheet for Patients:  StrictlyIdeas.no Fact Sheet for Healthcare Providers: BankingDealers.co.za This test is not yet approved or cleared by the [place] FDA and has been authorized for detection and/or diagnosis of SARS-CoV-2 by FDA under  an Emergency Use Authorization (EUA).  This EUA will remain in effect (meaning this test can be used) for the duration of the COVID-19 declaration under Section 564(b)(1) of the Act, 21 U.S.C. section 360bbb-3(b)(1), unless the authorization is terminated or revoked sooner. Performed at [facility], [address] [place], [place] [address]   MRSA PCR Screening     Status: None   Collection Time: 10/05/18  7:17 PM   Specimen: Nasopharyngeal  Result Value Ref Range Status   MRSA by PCR NEGATIVE NEGATIVE Final    Comment:        The GeneXpert MRSA Assay (FDA approved for NASAL specimens only), is one component of a comprehensive MRSA colonization surveillance program. It is not intended to diagnose MRSA infection nor to guide or monitor treatment for MRSA infections. Performed at [facility], [address] [place], [place] [address]          Radiology Studies: No results found.      Scheduled Meds: . Chlorhexidine Gluconate Cloth  6 each Topical Q0600  . heparin  3,100 Units Intracatheter Once  . heparin  5,000 Units Subcutaneous Q8H  . insulin aspart  0-5 Units Subcutaneous QHS  . insulin aspart  0-9 Units Subcutaneous TID WC  . insulin glargine  10 Units Subcutaneous Q2200  . multivitamin  1 tablet Oral Daily   Continuous Infusions: . sodium chloride Stopped (10/07/18 0127)  . ampicillin-sulbactam (UNASYN) IV Stopped (10/08/18 0130)     LOS: 3 days        Aline August, MD [facility] 10/08/2018, 8:02 AM

## 2018-10-08 NOTE — Progress Notes (Signed)
[facility] Progress Note   Subjective:   Patient seen and examined at bedside.  Reports improvement in weakness. AVF used for dialysis yesterday, nurse noted no problems during treatment.  Plan for HD again today using AVF.   Objective Vitals:   10/07/18 2207 10/07/18 2219 10/07/18 2320 10/08/18 0553  BP: (!) 145/78  116/74 (!) 110/26  Pulse: 95  89 82  Resp: 17  16 20   Temp: 98.3 F (36.8 C)  98.1 F (36.7 C) 98.1 F (36.7 C)  TempSrc: Oral  Oral   SpO2: 98%  99% 97%  Weight:  84.2 kg    Height:       Physical Exam General:NAD, well appearing male, sitting up in bed   Heart:RRR Lungs:mostly CTAB with faint crackles in bases  Abdomen:soft, NTND Extremities:no edema on L, BKA on R Dialysis Access: LU AVF +b/t, L thigh temp cath   [facility]   10/07/18 0500 10/07/18 1852 10/07/18 2219  Weight: 86.7 kg 89.3 kg 84.2 kg    Intake/Output Summary (Last 24 hours) at 10/08/2018 1437 Last data filed at 10/07/2018 2207 Gross per 24 hour  Intake 309.23 ml  Output 3000 ml  Net -2690.77 ml    Additional Objective Labs: Basic Metabolic Panel: Recent Labs  Lab 10/06/18 0720 10/07/18 0426 10/08/18 0354  NA 137 136 137  K 5.3* 6.3* 4.9  CL 96* 98 99  CO2 29 25 27   GLUCOSE 84 71 106*  BUN 24* 35* 22*  CREATININE 4.61* 6.06* 4.84*  CALCIUM 8.1* 8.1* 8.8*   Liver Function Tests: Recent Labs  Lab 10/05/18 1603 10/05/18 2022  AST 33 32  ALT 38 34  ALKPHOS 112 103  BILITOT 0.9 0.7  PROT 7.8 7.0  ALBUMIN 4.0 3.6   Recent Labs  Lab 10/05/18 1449  LIPASE 34   CBC: Recent Labs  Lab 10/05/18 1449 10/06/18 0423 10/06/18 0720 10/07/18 0426 10/08/18 0354  WBC 9.3 8.8 8.3 8.5 6.5  NEUTROABS  --   --  5.6  --  4.2  HGB 10.1* 9.3* 9.0* 9.9* 10.1*  HCT 32.7* 29.0* 28.0* 30.9* 31.5*  MCV 109.7* 107.0* 106.1* 106.6* 104.3*  PLT 197 206 195 227 226   CBG: Recent Labs  Lab 10/07/18 1138 10/07/18 1715 10/07/18 2325 10/08/18 0803 10/08/18 1237   GLUCAP 80 76 78 83 82   Studies/Results: No results found.  Medications: . sodium chloride Stopped (10/07/18 0127)  . ampicillin-sulbactam (UNASYN) IV     . Chlorhexidine Gluconate Cloth  6 each Topical Q0600  . heparin  3,100 Units Intracatheter Once  . heparin  5,000 Units Subcutaneous Q8H  . insulin aspart  0-5 Units Subcutaneous QHS  . insulin aspart  0-9 Units Subcutaneous TID WC  . multivitamin  1 tablet Oral Daily    Dialysis Orders:  [place] patient  Assessment/Plan: 1. Hyperkalemia - resolved post HD. K 4.9.   2. Non compliance - usually skips Wed dialysis. Discussed importance of compliance with HD and low K diet.  3. AVG malfunction - Able to use AVG yesterday.  Going to have HD again tonight and see if it is again successful. Scheduled for fistulogram tomorrow w/VVS.  Need to have temp cath removed prior to d/c.  4. Dental Abscess - on Unasyn. Per primary 4. ESRD - on HD MWF.  Extra HD yesterday d/t hyperkalemia.  Orders written for HD today per regular schedule.  5. Anemia of CKD- Hgb 10.1. No indication for ESA at  this time. 6. Secondary hyperparathyroidism - Ca at goal. Will check phos.   7. HTN/volume - Bp well controlled.  Faint crackles6 on auscultation, plan for UF goal 2-3L as tolerated.  8. Nutrition - Renal diet w/fluid restrictions.   Jen Mow, PA-C [facility] Pager: [phone] 10/08/2018,2:37 PM  LOS: 3 days

## 2018-10-08 NOTE — Progress Notes (Signed)
Pharmacy Antibiotic Note  Brendan Campbell is a 48 y.o. male admitted on 10/05/2018 with dental abcess.  Pharmacy has been consulted for Unasyn dosing. Patient with h/o ESRD.   Plan: Unasyn 1500 mg IV every 12 hours. Monitor labs, c/s, and patient improvement.  Height: 5\' 9"  (175.3 cm) Weight: 185 lb 10 oz (84.2 kg)(-1.6 for prosthesis stood to scale ) IBW/kg (Calculated) : 70.7  Temp (24hrs), Avg:98.1 F (36.7 C), Min:98 F (36.7 C), Max:98.3 F (36.8 C)  Recent Labs  Lab 10/05/18 1449 10/05/18 1603 10/05/18 2022 10/06/18 0423 10/06/18 0720 10/07/18 0426 10/08/18 0354  WBC 9.3  --   --  8.8 8.3 8.5 6.5  CREATININE  --  10.73* 10.44*  --  4.61* 6.06* 4.84*    Estimated Creatinine Clearance: 18.9 mL/min (A) (by C-G formula based on SCr of 4.84 mg/dL (H)).    Allergies  Allergen Reactions  . Tape Other (See Comments)    Pulls skin off    Antimicrobials this admission: Unasyn 8/29 >>     Dose adjustments this admission: Unasyn 3gm IV q12h>> 1.5gm IV q12h for ESRD  Microbiology results    8/29 MRSA PCR: negative  Brendan Campbell A. Levada Dy, PharmD, BCPS, FNKF Clinical Pharmacist [place] Please utilize Amion for appropriate phone number to reach the unit pharmacist ([place])   10/08/2018 9:12 AM

## 2018-10-09 ENCOUNTER — Encounter (HOSPITAL_COMMUNITY)
Admission: EM | Disposition: A | Discharge status: Home / Self Care | Payer: Self-pay | Source: Ambulatory Visit | Attending: Internal Medicine

## 2018-10-09 DIAGNOSIS — K047 Periapical abscess without sinus: Secondary | ICD-10-CM

## 2018-10-09 HISTORY — PX: PERIPHERAL VASCULAR BALLOON ANGIOPLASTY: CATH118281

## 2018-10-09 HISTORY — PX: A/V FISTULAGRAM: CATH118298

## 2018-10-09 LAB — CBC WITH DIFFERENTIAL/PLATELET
Abs Immature Granulocytes: 0.01 10*3/uL (ref 0.00–0.07)
Basophils Absolute: 0.1 10*3/uL (ref 0.0–0.1)
Basophils Relative: 1 %
Eosinophils Absolute: 0.3 10*3/uL (ref 0.0–0.5)
Eosinophils Relative: 5 %
HCT: 33.9 % — ABNORMAL LOW (ref 39.0–52.0)
Hemoglobin: 11 g/dL — ABNORMAL LOW (ref 13.0–17.0)
Immature Granulocytes: 0 %
Lymphocytes Relative: 16 %
Lymphs Abs: 1 10*3/uL (ref 0.7–4.0)
MCH: 34.1 pg — ABNORMAL HIGH (ref 26.0–34.0)
MCHC: 32.4 g/dL (ref 30.0–36.0)
MCV: 105 fL — ABNORMAL HIGH (ref 80.0–100.0)
Monocytes Absolute: 0.4 10*3/uL (ref 0.1–1.0)
Monocytes Relative: 6 %
Neutro Abs: 4.4 10*3/uL (ref 1.7–7.7)
Neutrophils Relative %: 72 %
Platelets: 250 10*3/uL (ref 150–400)
RBC: 3.23 MIL/uL — ABNORMAL LOW (ref 4.22–5.81)
RDW: 12.6 % (ref 11.5–15.5)
WBC: 6.1 10*3/uL (ref 4.0–10.5)
nRBC: 0 % (ref 0.0–0.2)

## 2018-10-09 LAB — BASIC METABOLIC PANEL
Anion gap: 13 (ref 5–15)
BUN: 14 mg/dL (ref 6–20)
CO2: 28 mmol/L (ref 22–32)
Calcium: 9.1 mg/dL (ref 8.9–10.3)
Chloride: 95 mmol/L — ABNORMAL LOW (ref 98–111)
Creatinine, Ser: 3.86 mg/dL — ABNORMAL HIGH (ref 0.61–1.24)
GFR calc Af Amer: 20 mL/min — ABNORMAL LOW (ref >60)
GFR calc non Af Amer: 17 mL/min — ABNORMAL LOW (ref >60)
Glucose, Bld: 152 mg/dL — ABNORMAL HIGH (ref 70–99)
Potassium: 4.3 mmol/L (ref 3.5–5.1)
Sodium: 136 mmol/L (ref 135–145)

## 2018-10-09 LAB — GLUCOSE, CAPILLARY
Glucose-Capillary: 91 mg/dL (ref 70–99)
Glucose-Capillary: 73 mg/dL (ref 70–99)

## 2018-10-09 SURGERY — A/V FISTULAGRAM
Anesthesia: LOCAL

## 2018-10-09 MED ORDER — MIDAZOLAM HCL 2 MG/2ML IJ SOLN
INTRAMUSCULAR | Status: DC | PRN
  Administered 2018-10-09: 1 mg via INTRAVENOUS

## 2018-10-09 MED ORDER — HEPARIN (PORCINE) IN NACL 1000-0.9 UT/500ML-% IV SOLN
INTRAVENOUS | Status: DC | PRN
  Administered 2018-10-09: 500 mL

## 2018-10-09 MED ORDER — HYDROCODONE-ACETAMINOPHEN 5-325 MG PO TABS: 1.0000 | ORAL_TABLET | Freq: Four times a day (QID) | ORAL | 0 refills | Status: DC | PRN

## 2018-10-09 MED ORDER — AMOXICILLIN 500 MG PO CAPS: 500.0000 mg | ORAL_CAPSULE | Freq: Every day | ORAL | 0 refills | Status: DC

## 2018-10-09 MED ORDER — MIDAZOLAM HCL 2 MG/2ML IJ SOLN
INTRAMUSCULAR | Status: AC
  Filled 2018-10-09: qty 2

## 2018-10-09 MED ORDER — ONDANSETRON HCL 4 MG PO TABS: 4.0000 mg | ORAL_TABLET | Freq: Four times a day (QID) | ORAL | 0 refills | Status: DC | PRN

## 2018-10-09 MED ORDER — IODIXANOL 320 MG/ML IV SOLN
INTRAVENOUS | Status: DC | PRN
  Administered 2018-10-09: 30 mL via INTRAVENOUS

## 2018-10-09 MED ORDER — HEPARIN (PORCINE) IN NACL 1000-0.9 UT/500ML-% IV SOLN
INTRAVENOUS | Status: AC
  Filled 2018-10-09: qty 500

## 2018-10-09 MED ORDER — LIDOCAINE HCL (PF) 1 % IJ SOLN
INTRAMUSCULAR | Status: DC | PRN
  Administered 2018-10-09: 2 mL via INTRADERMAL

## 2018-10-09 MED ORDER — FENTANYL CITRATE (PF) 100 MCG/2ML IJ SOLN
INTRAMUSCULAR | Status: AC
  Filled 2018-10-09: qty 2

## 2018-10-09 MED ORDER — FENTANYL CITRATE (PF) 100 MCG/2ML IJ SOLN
INTRAMUSCULAR | Status: DC | PRN
  Administered 2018-10-09: 25 ug via INTRAVENOUS

## 2018-10-09 MED ORDER — LIDOCAINE HCL (PF) 1 % IJ SOLN
INTRAMUSCULAR | Status: AC
  Filled 2018-10-09: qty 30

## 2018-10-09 SURGICAL SUPPLY — 12 items
BALLN MUSTANG 7.0X40 75 (BALLOONS) ×3
BALLOON MUSTANG 7.0X40 75 (BALLOONS) ×2 IMPLANT
KIT ENCORE 26 ADVANTAGE (KITS) ×3 IMPLANT
KIT MICROPUNCTURE NIT STIFF (SHEATH) ×6 IMPLANT
PACK CARDIAC CATHETERIZATION (CUSTOM PROCEDURE TRAY) ×3 IMPLANT
PROTECTION STATION PRESSURIZED (MISCELLANEOUS) ×3
SHEATH PINNACLE R/O II 6F 4CM (SHEATH) ×3 IMPLANT
SHEATH PROBE COVER 6X72 (BAG) ×3 IMPLANT
STATION PROTECTION PRESSURIZED (MISCELLANEOUS) ×2 IMPLANT
STOPCOCK MORSE 400PSI 3WAY (MISCELLANEOUS) ×3 IMPLANT
TUBING CIL FLEX 10 FLL-RA (TUBING) ×3 IMPLANT
WIRE BENTSON .035X145CM (WIRE) ×3 IMPLANT

## 2018-10-09 NOTE — Op Note (Signed)
    Patient name: Brendan Campbell MRN: [id number] DOB: Sep 08, 1970 Sex: male  10/09/2018 Pre-operative Diagnosis: ESRD Post-operative diagnosis:  Same Surgeon:  Annamarie Major Procedure Performed:  1.  Ultrasound-guided access, left upper arm dialysis graft  2.  Shuntogram  3.  Venoplasty, venous anastomosis (peripheral vein)  4.  Conscious sedation (17 minutes)   Indications: Patient has been having difficulty with access of his graft.  He comes in today for shuntogram  Procedure:  The patient was identified in the holding area and taken to room 8.  The patient was then placed supine on the table and prepped and draped in the usual sterile fashion.  A time out was called.  Conscious sedation was administered with the use of IV fentanyl and Versed under continuous physician and nurse monitoring.  Heart rate, blood pressure, and oxygen saturations were continuously monitored.  Total sedation time was 17 minutes ultrasound was used to evaluate the fistula.  The vein was patent and compressible.  A digital ultrasound image was acquired.  The fistula was then accessed under ultrasound guidance using a micropuncture needle.  An 018 wire was then asvanced without resistance and a micropuncture sheath was placed.  Contrast injections were then performed through the sheath.  Findings: The central venous system is widely patent without stenosis.  The arterial venous anastomosis is widely patent.  The dialysis graft is patent throughout the course however at the venous anastomosis there is approximately a 50% stenosis.   Intervention: After the above images were acquired the decision was made to proceed with intervention.  Over a Bentson wire, a 6 French sheath was inserted.  No heparin was given.  A 7 x 40 balloon was used to perform balloon angioplasty of the venous outflow tract.  Balloon was taken to nominal pressure for 30 seconds.  Completion imaging revealed resolution of the stenosis.  Catheters wires  were removed.  Suture closure of the sheath was performed.  There are no complications.  Impression:  #1  No central venous stenosis  #2  Widely patent arterial venous anastomosis  #3  50-60% stenosis at the venous outflow tract successfully treated with venoplasty using a 7 mm balloon and no residual stenosis.   Theotis Burrow, M.D., [facility] [place] Office: [phone] Pager:  [phone]

## 2018-10-09 NOTE — Progress Notes (Signed)
Patient requesting transportation home to [place] and does not have family to pick him up. CSW in contact with Transportation Coordinator to arrange a [employer] for patient.   Percell Locus Acey [place] LCSW [phone]

## 2018-10-09 NOTE — Progress Notes (Signed)
    Patient arrived from [facility] with left thigh temp catheter in place.  We have been asked to take it out prior to his discharge today.  Temp cath was pulled and direct groin pressure was held for 5 min.  A pressure dressing was placed over cath exit site.  No drainage, ecchymosis or hematoma noted.  Patient tolerated procedure well.  Roxy Horseman PA-C

## 2018-10-09 NOTE — Progress Notes (Signed)
Received call from tele about pt having ST elevation of 2.4. Pt asymptomatic. MD notified. STAT EKG obtained. Will continue to monitor.

## 2018-10-09 NOTE — Discharge Summary (Signed)
Physician Discharge Summary  KAYSON TASKER ACZ:[id number] DOB: 1970-11-24 DOA: 10/05/2018  PCP: System, Pcp Not In  Admit date: 10/05/2018 Discharge date: 10/09/2018  Admitted From: Home Disposition: Home  Recommendations for Outpatient Follow-up:  1. Follow up with PCP in 1 week  2. Outpatient follow-up with dialysis as scheduled 3. Patient will need evaluation by dentist at earliest convenience 4. Follow up in ED if symptoms worsen or new appear   Home Health: No Equipment/Devices: None  Discharge Condition: Stable CODE STATUS: Full Diet recommendation: Heart healthy/carb modified/renal hemodialysis diet  Brief/Interim Summary: 48 year old male with history of end-stage renal disease on hemodialysis, diabetes mellitus type 2, CHF, anemia of chronic disease, hyperlipidemia, status post right BKA presented with hyperkalemia at dialysis with potassium reported as 8.  Nephrology recommended transfer to [facility] for vascular surgery evaluation.  He had a fistulogram done today and vascular surgery and nephrology have cleared the patient for discharge.  He was started on Unasyn for possible left-sided dental abscess and will be discharged on oral Augmentin with need for outpatient dentist evaluation at earliest convenience.  Discharge Diagnoses:   Severe hyperkalemia with life-threatening EKG changes -Most likely secondary to hemodialysis noncompliance -Improved and stabilized -Outpatient follow-up  End-stage renal disease on hemodialysis Probable AV fistula malfunction -Nephrology following.    Patient had dialysis per nephrology schedule. - Patient had left femoral HD catheter placement by general surgery on 10/05/2018 at [facility].   -Status post fistulogram done today by vascular surgery -Vascular surgery and nephrology have cleared the patient for discharge -Vascular surgery will remove left femoral HD catheter prior to discharge.  Left-sided dental abscess -Currently on  Unasyn. Switch to oral Augmentin upon discharge for 7 days and will need dentist evaluation as an outpatient at earliest convenience.  Generalized weakness--from hyperkalemia.  Improving. No need for MRI at this time.  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  Ambulates with a cane and uses prosthesis.  Outpatient follow-up  Anemia of chronic disease  -Most likely secondary to his renal failure.  Hemoglobin stable.    Outpatient follow-up.  Diabetes mellitus type 2 with hypoglycemia -Blood sugars on the lower side.    Lantus has been discontinued for now.  Outpatient follow-up with PCP regarding need for resumption of Lantus.  Continue carb modified diet.  Question of ST elevations on telemetry -Stat EKG done which does not shows any ST elevations.  No chest pain reported.  Okay to discharge.  Discharge Instructions  Discharge Instructions    Diet - low sodium heart healthy   Complete by: As directed    Diet Carb Modified   Complete by: As directed    Increase activity slowly   Complete by: As directed      Allergies as of 10/09/2018      Reactions   Tape Other (See Comments)   Pulls skin off      Medication List    STOP taking these medications   baclofen 10 MG tablet Commonly known as: LIORESAL   Insulin Glargine 100 UNIT/ML Solostar Pen Commonly known as: Lantus SoloStar   oxyCODONE-acetaminophen 5-325 MG tablet Commonly known as: PERCOCET/ROXICET     TAKE these medications   amoxicillin 500 MG capsule Commonly known as: AMOXIL Take 1 capsule (500 mg total) by mouth daily.   aspirin 81 MG chewable tablet Chew 81 mg by mouth daily.   atorvastatin 10 MG tablet Commonly known as: LIPITOR Take 1 tablet (10 mg total) by mouth every  evening.   blood glucose meter kit and supplies Kit Dispense based on patient and insurance preference. Use up to four times daily as directed. (FOR ICD-9 250.00, 250.01).   HYDROcodone-acetaminophen 5-325 MG  tablet Commonly known as: NORCO/VICODIN Take 1 tablet by mouth every 6 (six) hours as needed for moderate pain or severe pain.   multivitamin Tabs tablet Take 1 tablet by mouth daily.   ondansetron 4 MG tablet Commonly known as: ZOFRAN Take 1 tablet (4 mg total) by mouth every 6 (six) hours as needed for nausea.   polyethylene glycol powder 17 GM/SCOOP powder Commonly known as: GLYCOLAX/MIRALAX 1/2 to 2 capfuls daily for constipation.   Sure Comfort Pen Needles 31G X 5 MM Misc Generic drug: Insulin Pen Needle USE AS DIRECTED DAILY.      Follow-up Information    PCP. Schedule an appointment as soon as possible for a visit in 1 week(s).        Dentist Follow up.   Why: at earliest convenience         Allergies  Allergen Reactions  . Tape Other (See Comments)    Pulls skin off    Consultations: Nephrology/general surgery/vascular surgery  Procedures/Studies: Ct Head Wo Contrast  Result Date: 10/05/2018 CLINICAL DATA:  Paresthesia. EXAM: CT HEAD WITHOUT CONTRAST TECHNIQUE: Contiguous axial images were obtained from the base of the skull through the vertex without intravenous contrast. COMPARISON:  None. FINDINGS: Brain: Mild chronic ischemic white matter disease is noted. No mass effect or midline shift is noted. Ventricular size is within normal limits. There is no evidence of mass lesion, hemorrhage or acute infarction. Vascular: No hyperdense vessel or unexpected calcification. Skull: Normal. Negative for fracture or focal lesion. Sinuses/Orbits: No acute finding. Other: None. IMPRESSION: Mild chronic ischemic white matter disease. No acute intracranial abnormality seen. Electronically Signed   By: Marijo Conception M.D.   On: 10/05/2018 15:34   Dg Chest Portable 1 View  Result Date: 10/05/2018 CLINICAL DATA:  Shortness of breath.  The patient missed dialysis. EXAM: PORTABLE CHEST 1 VIEW COMPARISON:  April 24, 2018 FINDINGS: Stable cardiomegaly. The hila and mediastinum  are unchanged. No pneumothorax. No nodules or masses. No focal infiltrates. No overt edema. Mild pulmonary venous congestion suspected. IMPRESSION: Cardiomegaly and mild pulmonary venous congestion.  No overt edema. Electronically Signed   By: Dorise Bullion III M.D   On: 10/05/2018 12:40    Left femoral hemodialysis catheter placement on 10/05/2018 by general surgery.  Vascular surgery to remove this catheter prior to discharge today  Fistulogram on 10/09/2018 by vascular surgery   Subjective: Patient seen and examined at bedside.  Denies any overnight fever, nausea or vomiting.  Feels okay to go home today.  Discharge Exam: Vitals:   10/09/18 1124 10/09/18 1128  BP: 133/77 135/84  Pulse: 88 90  Resp: 19 19  Temp:    SpO2: 100% 100%    General exam: No distress Respiratory system: Bilateral decreased breath sounds at bases with scattered rales.  No wheezing  cardiovascular system: Rate controlled, S1-S2 heard Gastrointestinal system: Abdomen is nondistended, soft and nontender. Normal bowel sounds heard. Extremities: No cyanosis; trace left lower extremity edema; right BKA present   The results of significant diagnostics from this hospitalization (including imaging, microbiology, ancillary and laboratory) are listed below for reference.     Microbiology: Recent Results (from the past 240 hour(s))  SARS Coronavirus 2 [facility] order, Performed in [facility] hospital lab) Nasopharyngeal Nasopharyngeal Swab  Status: None   Collection Time: 10/05/18  1:43 PM   Specimen: Nasopharyngeal Swab  Result Value Ref Range Status   SARS Coronavirus 2 NEGATIVE NEGATIVE Final    Comment: (NOTE) If result is NEGATIVE SARS-CoV-2 target nucleic acids are NOT DETECTED. The SARS-CoV-2 RNA is generally detectable in upper and lower  respiratory specimens during the acute phase of infection. The lowest  concentration of SARS-CoV-2 viral copies this assay can detect is 250  copies / mL. A  negative result does not preclude SARS-CoV-2 infection  and should not be used as the sole basis for treatment or other  patient management decisions.  A negative result may occur with  improper specimen collection / handling, submission of specimen other  than nasopharyngeal swab, presence of viral mutation(s) within the  areas targeted by this assay, and inadequate number of viral copies  (<250 copies / mL). A negative result must be combined with clinical  observations, patient history, and epidemiological information. If result is POSITIVE SARS-CoV-2 target nucleic acids are DETECTED. The SARS-CoV-2 RNA is generally detectable in upper and lower  respiratory specimens dur ing the acute phase of infection.  Positive  results are indicative of active infection with SARS-CoV-2.  Clinical  correlation with patient history and other diagnostic information is  necessary to determine patient infection status.  Positive results do  not rule out bacterial infection or co-infection with other viruses. If result is PRESUMPTIVE POSTIVE SARS-CoV-2 nucleic acids MAY BE PRESENT.   A presumptive positive result was obtained on the submitted specimen  and confirmed on repeat testing.  While 2019 novel coronavirus  (SARS-CoV-2) nucleic acids may be present in the submitted sample  additional confirmatory testing may be necessary for epidemiological  and / or clinical management purposes  to differentiate between  SARS-CoV-2 and other Sarbecovirus currently known to infect humans.  If clinically indicated additional testing with an alternate test  methodology [phone]) is advised. The SARS-CoV-2 RNA is generally  detectable in upper and lower respiratory sp ecimens during the acute  phase of infection. The expected result is Negative. Fact Sheet for Patients:  StrictlyIdeas.no Fact Sheet for Healthcare Providers: BankingDealers.co.za This test is not  yet approved or cleared by the [place] FDA and has been authorized for detection and/or diagnosis of SARS-CoV-2 by FDA under an Emergency Use Authorization (EUA).  This EUA will remain in effect (meaning this test can be used) for the duration of the COVID-19 declaration under Section 564(b)(1) of the Act, 21 U.S.C. section 360bbb-3(b)(1), unless the authorization is terminated or revoked sooner. Performed at [facility], [address] [place], [place] [address]   MRSA PCR Screening     Status: None   Collection Time: 10/05/18  7:17 PM   Specimen: Nasopharyngeal  Result Value Ref Range Status   MRSA by PCR NEGATIVE NEGATIVE Final    Comment:        The GeneXpert MRSA Assay (FDA approved for NASAL specimens only), is one component of a comprehensive MRSA colonization surveillance program. It is not intended to diagnose MRSA infection nor to guide or monitor treatment for MRSA infections. Performed at [facility], [address], [place], [place] [address]      Labs: BNP (last 3 results) No results for input(s): BNP in the last 8760 hours. Basic Metabolic Panel: Recent Labs  Lab 10/05/18 2022 10/06/18 0720 10/07/18 0426 10/08/18 0354 10/08/18 1722 10/09/18 0210  NA 138 137 136 137  --  136  K 6.4*  5.3* 6.3* 4.9  --  4.3  CL 98 96* 98 99  --  95*  CO2 19* _0 --  28  GLUCOSE 156* 84 71 106*  --  152*  BUN 79* 24* 35* 22*  --  14  CREATININE 10.44* 4.61* 6.06* 4.84*  --  3.86*  CALCIUM 8.4* 8.1* 8.1* 8.8*  --  9.1  MG  --   --   --  2.0  --   --   PHOS  --   --   --   --  4.6  --    Liver Function Tests: Recent Labs  Lab 10/05/18 1603 10/05/18 2022  AST 33 32  ALT 38 34  ALKPHOS 112 103  BILITOT 0.9 0.7  PROT 7.8 7.0  ALBUMIN 4.0 3.6   Recent Labs  Lab 10/05/18 1449  LIPASE 34   No results for input(s): AMMONIA in the last 168 hours. CBC: Recent Labs  Lab 10/06/18 0423 10/06/18 0720 10/07/18 0426 10/08/18 0354 10/09/18 0210  WBC  8.8 8.3 8.5 6.5 6.1  NEUTROABS  --  5.6  --  4.2 4.4  HGB 9.3* 9.0* 9.9* 10.1* 11.0*  HCT 29.0* 28.0* 30.9* 31.5* 33.9*  MCV 107.0* 106.1* 106.6* 104.3* 105.0*  PLT 206 195 227 226 250   Cardiac Enzymes: No results for input(s): CKTOTAL, CKMB, CKMBINDEX, TROPONINI in the last 168 hours. BNP: Invalid input(s): POCBNP CBG: Recent Labs  Lab 10/08/18 1237 10/08/18 1649 10/08/18 2139 10/08/18 2301 10/09/18 0801  GLUCAP 82 71 81 89 91   D-Dimer No results for input(s): DDIMER in the last 72 hours. Hgb A1c No results for input(s): HGBA1C in the last 72 hours. Lipid Profile No results for input(s): CHOL, HDL, LDLCALC, TRIG, CHOLHDL, LDLDIRECT in the last 72 hours. Thyroid function studies No results for input(s): TSH, T4TOTAL, T3FREE, THYROIDAB in the last 72 hours.  Invalid input(s): FREET3 Anemia work up No results for input(s): VITAMINB12, FOLATE, FERRITIN, TIBC, IRON, RETICCTPCT in the last 72 hours. Urinalysis No results found for: COLORURINE, APPEARANCEUR, LABSPEC, [place], GLUCOSEU, [place], BILIRUBINUR, KETONESUR, PROTEINUR, UROBILINOGEN, NITRITE, LEUKOCYTESUR Sepsis Labs Invalid input(s): PROCALCITONIN,  WBC,  [place] Microbiology Recent Results (from the past 240 hour(s))  SARS Coronavirus 2 [facility] order, Performed in [facility] hospital lab) Nasopharyngeal Nasopharyngeal Swab     Status: None   Collection Time: 10/05/18  1:43 PM   Specimen: Nasopharyngeal Swab  Result Value Ref Range Status   SARS Coronavirus 2 NEGATIVE NEGATIVE Final    Comment: (NOTE) If result is NEGATIVE SARS-CoV-2 target nucleic acids are NOT DETECTED. The SARS-CoV-2 RNA is generally detectable in upper and lower  respiratory specimens during the acute phase of infection. The lowest  concentration of SARS-CoV-2 viral copies this assay can detect is 250  copies / mL. A negative result does not preclude SARS-CoV-2 infection  and should not be used as the sole basis for treatment or other   patient management decisions.  A negative result may occur with  improper specimen collection / handling, submission of specimen other  than nasopharyngeal swab, presence of viral mutation(s) within the  areas targeted by this assay, and inadequate number of viral copies  (<250 copies / mL). A negative result must be combined with clinical  observations, patient history, and epidemiological information. If result is POSITIVE SARS-CoV-2 target nucleic acids are DETECTED. The SARS-CoV-2 RNA is generally detectable in upper and lower  respiratory specimens dur ing the acute phase of infection.  Positive  results are indicative of active infection with SARS-CoV-2.  Clinical  correlation with patient history and other diagnostic information is  necessary to determine patient infection status.  Positive results do  not rule out bacterial infection or co-infection with other viruses. If result is PRESUMPTIVE POSTIVE SARS-CoV-2 nucleic acids MAY BE PRESENT.   A presumptive positive result was obtained on the submitted specimen  and confirmed on repeat testing.  While 2019 novel coronavirus  (SARS-CoV-2) nucleic acids may be present in the submitted sample  additional confirmatory testing may be necessary for epidemiological  and / or clinical management purposes  to differentiate between  SARS-CoV-2 and other Sarbecovirus currently known to infect humans.  If clinically indicated additional testing with an alternate test  methodology [phone]) is advised. The SARS-CoV-2 RNA is generally  detectable in upper and lower respiratory sp ecimens during the acute  phase of infection. The expected result is Negative. Fact Sheet for Patients:  StrictlyIdeas.no Fact Sheet for Healthcare Providers: BankingDealers.co.za This test is not yet approved or cleared by the [place] FDA and has been authorized for detection and/or diagnosis of SARS-CoV-2  by FDA under an Emergency Use Authorization (EUA).  This EUA will remain in effect (meaning this test can be used) for the duration of the COVID-19 declaration under Section 564(b)(1) of the Act, 21 U.S.C. section 360bbb-3(b)(1), unless the authorization is terminated or revoked sooner. Performed at [facility], [address], [place], [place] [address]   MRSA PCR Screening     Status: None   Collection Time: 10/05/18  7:17 PM   Specimen: Nasopharyngeal  Result Value Ref Range Status   MRSA by PCR NEGATIVE NEGATIVE Final    Comment:        The GeneXpert MRSA Assay (FDA approved for NASAL specimens only), is one component of a comprehensive MRSA colonization surveillance program. It is not intended to diagnose MRSA infection nor to guide or monitor treatment for MRSA infections. Performed at [facility], [address] [place], [place] [address]      Time coordinating discharge: 35 minutes  SIGNED:   Aline August, MD  [facility] 10/09/2018, 12:40 PM

## 2018-10-09 NOTE — Progress Notes (Signed)
[facility] Progress Note   Subjective: Up in chair waiting to go home.   Objective Vitals:   10/09/18 1119 10/09/18 1124 10/09/18 1128 10/09/18 1248  BP: 126/80 133/77 135/84 113/62  Pulse: 84 88 90 84  Resp: 14 19 19 19   Temp:    97.6 F (36.4 C)  TempSrc:      SpO2: 100% 100% 100% 100%  Weight:      Height:       Physical Exam General: WN, WD male in NAD Heart: S1,S2 RRR Lungs: CTAB Abdomen: Active BS Extremities: No L LE edema. R BKA.  Dialysis Access: LUA AVG + bruit. Suture in place from f'grm.   Additional Objective Labs: Basic Metabolic Panel: Recent Labs  Lab 10/07/18 0426 10/08/18 0354 10/08/18 1722 10/09/18 0210  NA 136 137  --  136  K 6.3* 4.9  --  4.3  CL 98 99  --  95*  CO2 25 27  --  28  GLUCOSE 71 106*  --  152*  BUN 35* 22*  --  14  CREATININE 6.06* 4.84*  --  3.86*  CALCIUM 8.1* 8.8*  --  9.1  PHOS  --   --  4.6  --    Liver Function Tests: Recent Labs  Lab 10/05/18 1603 10/05/18 2022  AST 33 32  ALT 38 34  ALKPHOS 112 103  BILITOT 0.9 0.7  PROT 7.8 7.0  ALBUMIN 4.0 3.6   Recent Labs  Lab 10/05/18 1449  LIPASE 34   CBC: Recent Labs  Lab 10/06/18 0423 10/06/18 0720 10/07/18 0426 10/08/18 0354 10/09/18 0210  WBC 8.8 8.3 8.5 6.5 6.1  NEUTROABS  --  5.6  --  4.2 4.4  HGB 9.3* 9.0* 9.9* 10.1* 11.0*  HCT 29.0* 28.0* 30.9* 31.5* 33.9*  MCV 107.0* 106.1* 106.6* 104.3* 105.0*  PLT 206 195 227 226 250   Blood Culture No results found for: SDES, SPECREQUEST, CULT, REPTSTATUS  Cardiac Enzymes: No results for input(s): CKTOTAL, CKMB, CKMBINDEX, TROPONINI in the last 168 hours. CBG: Recent Labs  Lab 10/08/18 1649 10/08/18 2139 10/08/18 2301 10/09/18 0801 10/09/18 1244  GLUCAP 71 81 89 91 73   Iron Studies: No results for input(s): IRON, TIBC, TRANSFERRIN, FERRITIN in the last 72 hours. @lablastinr3 @ Studies/Results: No results found. Medications: . sodium chloride Stopped (10/07/18 0127)  .  ampicillin-sulbactam (UNASYN) IV 1.5 g (10/09/18 0017)   . Chlorhexidine Gluconate Cloth  6 each Topical Q0600  . Chlorhexidine Gluconate Cloth  6 each Topical Q0600  . heparin  3,100 Units Intracatheter Once  . heparin  5,000 Units Subcutaneous Q8H  . insulin aspart  0-5 Units Subcutaneous QHS  . insulin aspart  0-9 Units Subcutaneous TID WC  . multivitamin  1 tablet Oral Daily     Dialysis Orders:  [facility] patient  Assessment/Plan: 1. Hyperkalemia - resolved post HD. K 4.3. 2. Non compliance - usually skips Wed dialysis. Discussed importance of compliance with HD and low K diet.  3. AVG malfunction - Able to use AVG yesterday.  Going to have HD again tonight and see if it is again successful. Went for shuntogram today per Dr. Trula Slade S/P venoplasty venous anastomosis. Femoral T-Cath Dc'd per Bailey Mech PA.  4. Dental Abscess - on Unasyn now transitioned to appropriately dose amoxicillin. Per primary 4. ESRD - on HD MWF. Planning to go to HD tomorrow at OP center.  5. Anemia of CKD- Hgb 11. No indication for ESA at this  time. 6. Secondary hyperparathyroidism - Ca at goal. Will check phos.   7. HTN/volume - Bp well controlled.  8. Nutrition - Renal diet w/fluid restrictions.    H.  NP-C 10/09/2018, 2:11 PM  [employer] [phone]

## 2018-10-09 NOTE — Progress Notes (Signed)
Pt given discharge instructions, prescriptions, and care notes. Pt verbalized understanding AEB no further questions or concerns at this time. IV was discontinued, no redness, pain, or swelling noted at this time. Telemetry discontinued and Centralized Telemetry was notified. Pt left the floor via wheelchair with staff in stable condition. 

## 2018-10-09 NOTE — Interval H&P Note (Signed)
History and Physical Interval Note:  10/09/2018 9:23 AM  Brendan Campbell  has presented today for surgery, with the diagnosis of complication with fistula.  The various methods of treatment have been discussed with the patient and family. After consideration of risks, benefits and other options for treatment, the patient has consented to  Procedure(s): A/V FISTULAGRAM (N/A) as a surgical intervention.  The patient's history has been reviewed, patient examined, no change in status, stable for surgery.  I have reviewed the patient's chart and labs.  Questions were answered to the patient's satisfaction.     Annamarie Major

## 2018-10-10 ENCOUNTER — Encounter (HOSPITAL_COMMUNITY): Payer: Self-pay | Admitting: Surgery

## 2018-11-02 ENCOUNTER — Observation Stay (HOSPITAL_COMMUNITY): Payer: Medicaid Other

## 2018-11-02 ENCOUNTER — Other Ambulatory Visit: Payer: Self-pay

## 2018-11-02 ENCOUNTER — Observation Stay (HOSPITAL_COMMUNITY)
Admission: EM | Admit: 2018-11-02 | Discharge: 2018-11-03 | Disposition: A | Discharge status: Home / Self Care | Payer: Medicaid Other | Attending: Internal Medicine | Admitting: Internal Medicine

## 2018-11-02 ENCOUNTER — Encounter (HOSPITAL_COMMUNITY): Payer: Self-pay | Admitting: Emergency Medicine

## 2018-11-02 ENCOUNTER — Emergency Department (HOSPITAL_COMMUNITY): Payer: Medicaid Other

## 2018-11-02 DIAGNOSIS — F419 Anxiety disorder, unspecified: Secondary | ICD-10-CM | POA: Insufficient documentation

## 2018-11-02 DIAGNOSIS — N271 Small kidney, bilateral: Secondary | ICD-10-CM | POA: Insufficient documentation

## 2018-11-02 DIAGNOSIS — Z20828 Contact with and (suspected) exposure to other viral communicable diseases: Secondary | ICD-10-CM | POA: Diagnosis not present

## 2018-11-02 DIAGNOSIS — Z23 Encounter for immunization: Secondary | ICD-10-CM | POA: Diagnosis not present

## 2018-11-02 DIAGNOSIS — E1122 Type 2 diabetes mellitus with diabetic chronic kidney disease: Secondary | ICD-10-CM | POA: Insufficient documentation

## 2018-11-02 DIAGNOSIS — E875 Hyperkalemia: Secondary | ICD-10-CM | POA: Diagnosis present

## 2018-11-02 DIAGNOSIS — R0789 Other chest pain: Secondary | ICD-10-CM

## 2018-11-02 DIAGNOSIS — Z79899 Other long term (current) drug therapy: Secondary | ICD-10-CM | POA: Diagnosis not present

## 2018-11-02 DIAGNOSIS — N186 End stage renal disease: Secondary | ICD-10-CM | POA: Diagnosis present

## 2018-11-02 DIAGNOSIS — I447 Left bundle-branch block, unspecified: Secondary | ICD-10-CM | POA: Diagnosis not present

## 2018-11-02 DIAGNOSIS — E785 Hyperlipidemia, unspecified: Secondary | ICD-10-CM | POA: Insufficient documentation

## 2018-11-02 DIAGNOSIS — D631 Anemia in chronic kidney disease: Secondary | ICD-10-CM | POA: Diagnosis present

## 2018-11-02 DIAGNOSIS — E8889 Other specified metabolic disorders: Secondary | ICD-10-CM | POA: Diagnosis not present

## 2018-11-02 DIAGNOSIS — Z8249 Family history of ischemic heart disease and other diseases of the circulatory system: Secondary | ICD-10-CM | POA: Insufficient documentation

## 2018-11-02 DIAGNOSIS — I340 Nonrheumatic mitral (valve) insufficiency: Secondary | ICD-10-CM | POA: Diagnosis not present

## 2018-11-02 DIAGNOSIS — J810 Acute pulmonary edema: Secondary | ICD-10-CM | POA: Diagnosis present

## 2018-11-02 DIAGNOSIS — Z7982 Long term (current) use of aspirin: Secondary | ICD-10-CM | POA: Insufficient documentation

## 2018-11-02 DIAGNOSIS — Z87891 Personal history of nicotine dependence: Secondary | ICD-10-CM | POA: Diagnosis not present

## 2018-11-02 DIAGNOSIS — R0602 Shortness of breath: Principal | ICD-10-CM | POA: Insufficient documentation

## 2018-11-02 DIAGNOSIS — R079 Chest pain, unspecified: Secondary | ICD-10-CM | POA: Diagnosis present

## 2018-11-02 DIAGNOSIS — J90 Pleural effusion, not elsewhere classified: Secondary | ICD-10-CM | POA: Diagnosis not present

## 2018-11-02 DIAGNOSIS — I5031 Acute diastolic (congestive) heart failure: Secondary | ICD-10-CM | POA: Diagnosis not present

## 2018-11-02 DIAGNOSIS — Z992 Dependence on renal dialysis: Secondary | ICD-10-CM

## 2018-11-02 DIAGNOSIS — Z89511 Acquired absence of right leg below knee: Secondary | ICD-10-CM | POA: Diagnosis not present

## 2018-11-02 LAB — TROPONIN I (HIGH SENSITIVITY)
Troponin I (High Sensitivity): 32 ng/L — ABNORMAL HIGH (ref <18)
Troponin I (High Sensitivity): 36 ng/L — ABNORMAL HIGH (ref <18)
Troponin I (High Sensitivity): 36 ng/L — ABNORMAL HIGH (ref <18)
Troponin I (High Sensitivity): 35 ng/L — ABNORMAL HIGH (ref <18)

## 2018-11-02 LAB — D-DIMER, QUANTITATIVE: D-Dimer, Quant: 1.86 ug/mL-FEU — ABNORMAL HIGH (ref 0.00–0.50)

## 2018-11-02 LAB — COMPREHENSIVE METABOLIC PANEL
ALT: 19 U/L (ref 0–44)
AST: 23 U/L (ref 15–41)
Albumin: 3.9 g/dL (ref 3.5–5.0)
Alkaline Phosphatase: 112 U/L (ref 38–126)
Anion gap: 17 — ABNORMAL HIGH (ref 5–15)
BUN: 82 mg/dL — ABNORMAL HIGH (ref 6–20)
CO2: 22 mmol/L (ref 22–32)
Calcium: 7.7 mg/dL — ABNORMAL LOW (ref 8.9–10.3)
Chloride: 98 mmol/L (ref 98–111)
Creatinine, Ser: 10.43 mg/dL — ABNORMAL HIGH (ref 0.61–1.24)
GFR calc Af Amer: 6 mL/min — ABNORMAL LOW (ref >60)
GFR calc non Af Amer: 5 mL/min — ABNORMAL LOW (ref >60)
Glucose, Bld: 75 mg/dL (ref 70–99)
Potassium: 7.8 mmol/L (ref 3.5–5.1)
Sodium: 137 mmol/L (ref 135–145)
Total Bilirubin: 1 mg/dL (ref 0.3–1.2)
Total Protein: 7.9 g/dL (ref 6.5–8.1)

## 2018-11-02 LAB — CBC WITH DIFFERENTIAL/PLATELET
Abs Immature Granulocytes: 0.06 10*3/uL (ref 0.00–0.07)
Basophils Absolute: 0.1 10*3/uL (ref 0.0–0.1)
Basophils Relative: 1 %
Eosinophils Absolute: 0.3 10*3/uL (ref 0.0–0.5)
Eosinophils Relative: 2 %
HCT: 29.6 % — ABNORMAL LOW (ref 39.0–52.0)
Hemoglobin: 9.3 g/dL — ABNORMAL LOW (ref 13.0–17.0)
Immature Granulocytes: 0 %
Lymphocytes Relative: 11 %
Lymphs Abs: 1.4 10*3/uL (ref 0.7–4.0)
MCH: 34.2 pg — ABNORMAL HIGH (ref 26.0–34.0)
MCHC: 31.4 g/dL (ref 30.0–36.0)
MCV: 108.8 fL — ABNORMAL HIGH (ref 80.0–100.0)
Monocytes Absolute: 0.6 10*3/uL (ref 0.1–1.0)
Monocytes Relative: 4 %
Neutro Abs: 11 10*3/uL — ABNORMAL HIGH (ref 1.7–7.7)
Neutrophils Relative %: 82 %
Platelets: 274 10*3/uL (ref 150–400)
RBC: 2.72 MIL/uL — ABNORMAL LOW (ref 4.22–5.81)
RDW: 13.2 % (ref 11.5–15.5)
WBC: 13.4 10*3/uL — ABNORMAL HIGH (ref 4.0–10.5)
nRBC: 0 % (ref 0.0–0.2)

## 2018-11-02 LAB — SARS CORONAVIRUS 2 BY RT PCR (HOSPITAL ORDER, PERFORMED IN ~~LOC~~ HOSPITAL LAB): SARS Coronavirus 2: NEGATIVE

## 2018-11-02 LAB — PROCALCITONIN: Procalcitonin: 0.19 ng/mL

## 2018-11-02 LAB — LACTATE DEHYDROGENASE: LDH: 204 U/L — ABNORMAL HIGH (ref 98–192)

## 2018-11-02 LAB — C-REACTIVE PROTEIN: CRP: 0.8 mg/dL (ref <1.0)

## 2018-11-02 LAB — SEDIMENTATION RATE: Sed Rate: 75 mm/hr — ABNORMAL HIGH (ref 0–16)

## 2018-11-02 LAB — FERRITIN: Ferritin: 1134 ng/mL — ABNORMAL HIGH (ref 24–336)

## 2018-11-02 IMAGING — CT CT ABD-PELV W/O CM
2 of 4 series · 17 of 46 positions shown, 19 images · non-contrast
Comparison: None.

CLINICAL DATA: Abdominal pain.  Dialysis patient.

EXAM:
CT ABDOMEN AND PELVIS WITHOUT CONTRAST
TECHNIQUE: Multidetector CT imaging of the abdomen and pelvis was performed
following the standard protocol without IV contrast.

[Series 2: axial st · axial · 0.98mm/px · z∈[-938,-458]mm · 14 of 109 slices shown, 16 images]
[im 7/109  soft-tissue]
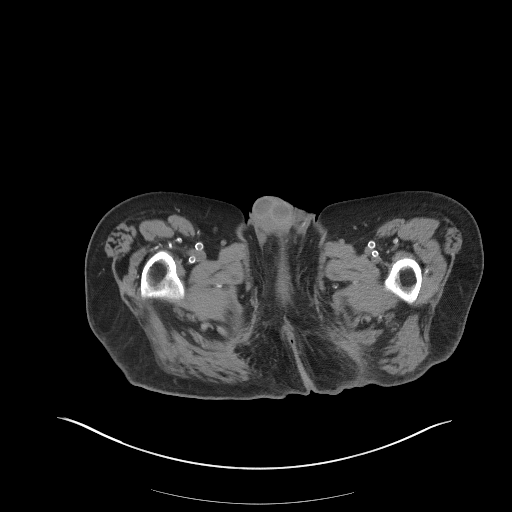
[im 7/109  bone]
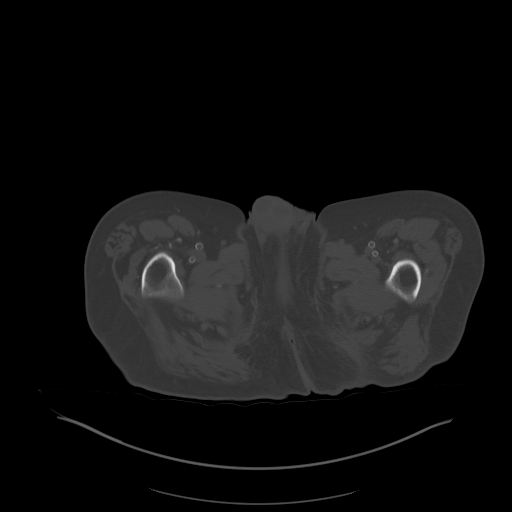
[im 13/109  soft-tissue]
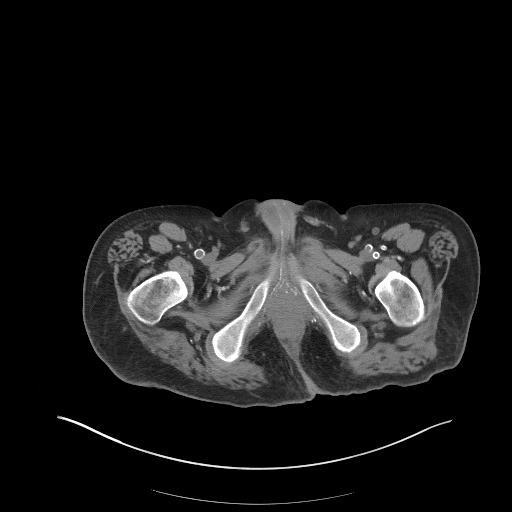
[im 25/109  soft-tissue]
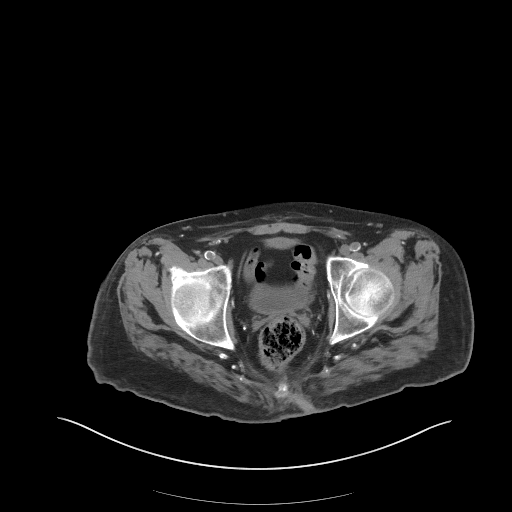
[im 31/109  soft-tissue]
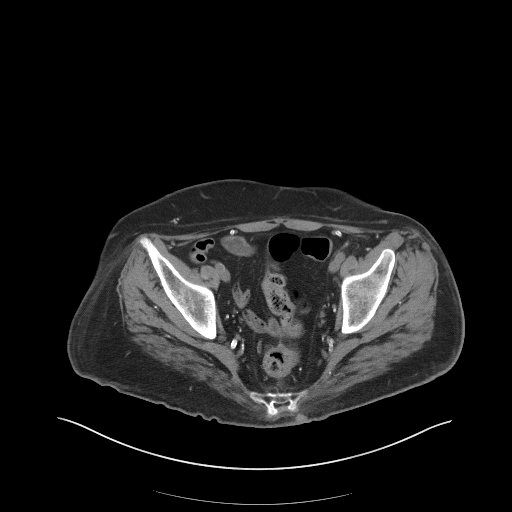
[im 37/109  soft-tissue]
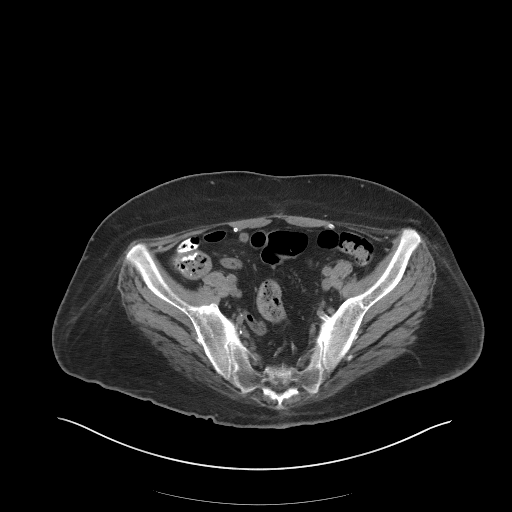
[im 43/109  soft-tissue]
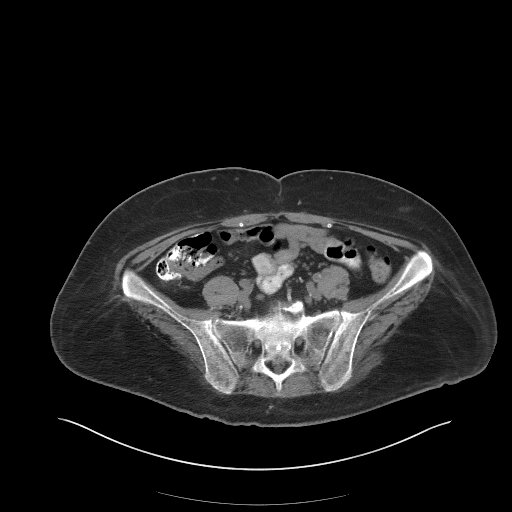
[im 49/109  soft-tissue]
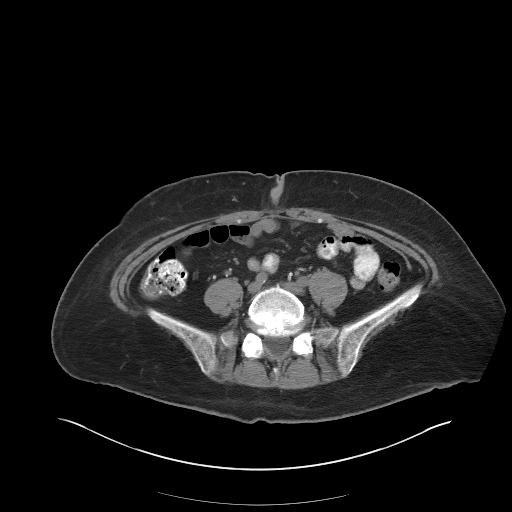
[im 61/109  soft-tissue]
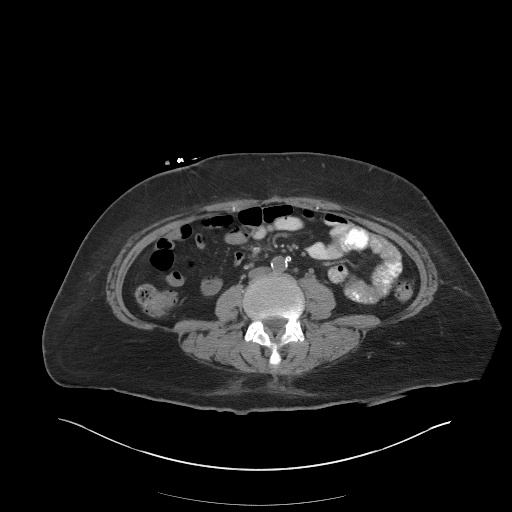
[im 67/109  soft-tissue]
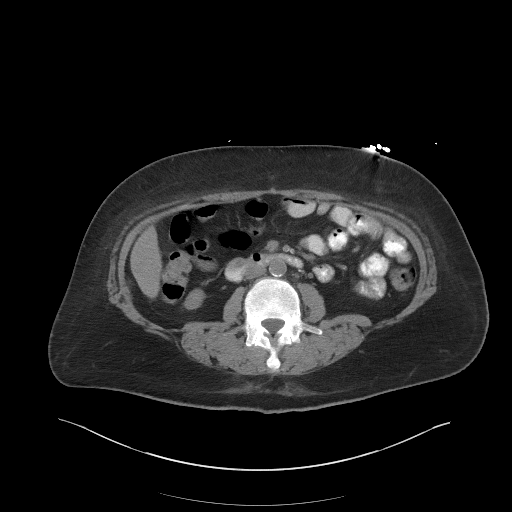
[im 67/109  bone]
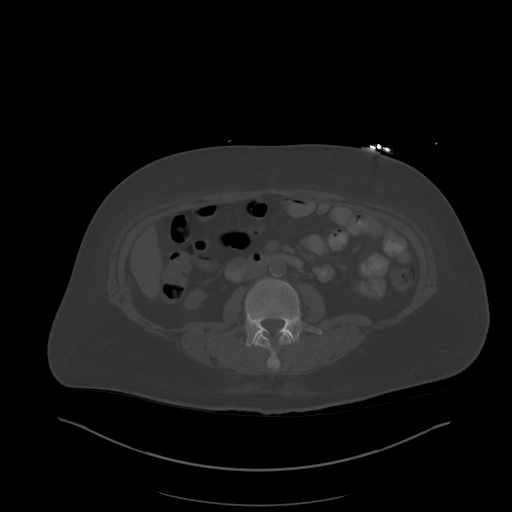
[im 73/109  soft-tissue]
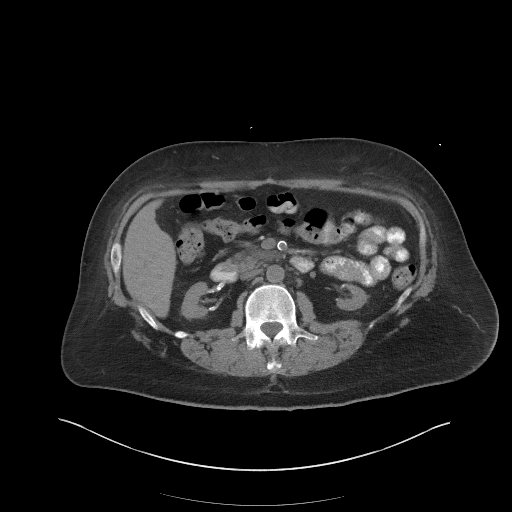
[im 79/109  soft-tissue]
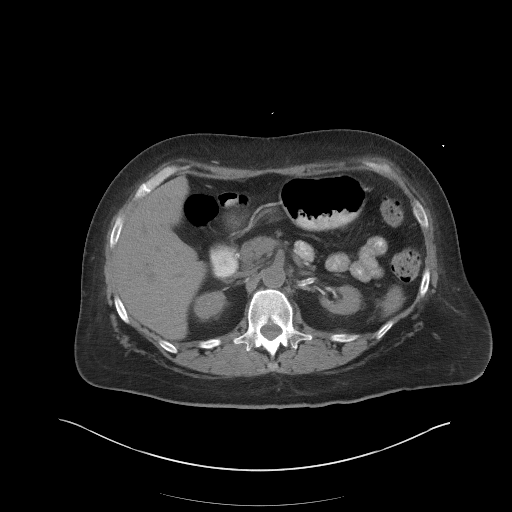
[im 85/109  soft-tissue]
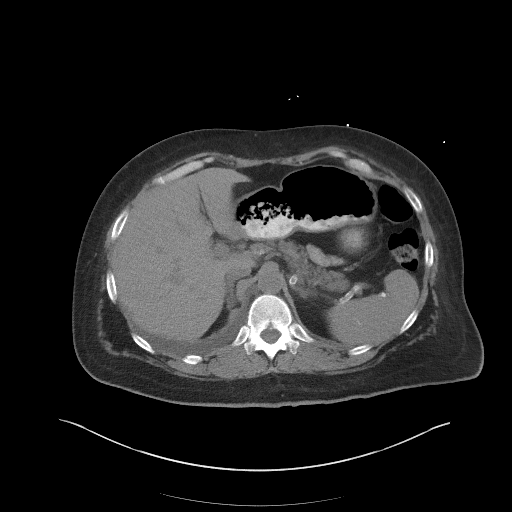
[im 97/109  soft-tissue]
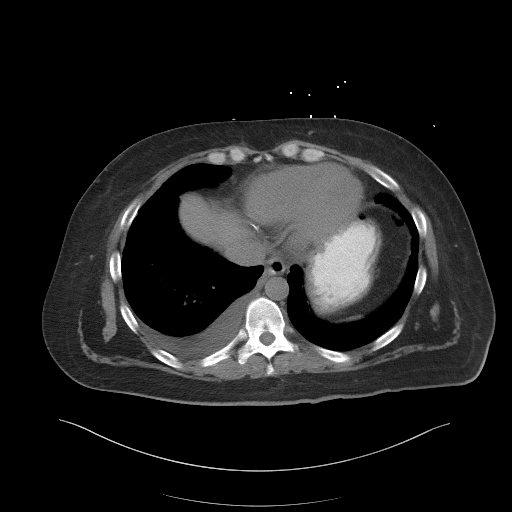
[im 103/109  soft-tissue]
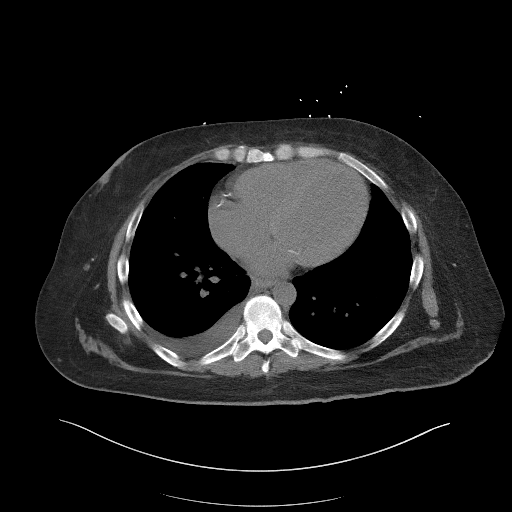

[Series 5: coronal st · coronal · 1.03mm/px · 3 of 127 slices shown]
[im 43/127  soft-tissue]
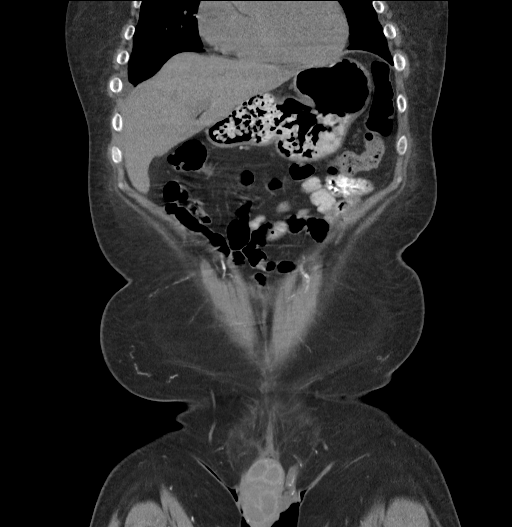
[im 57/127  soft-tissue]
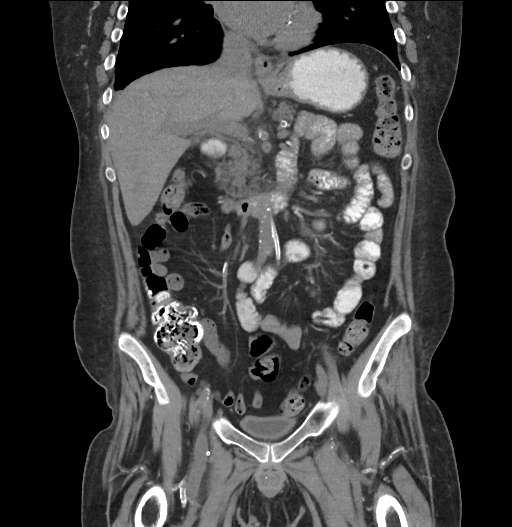
[im 71/127  soft-tissue]
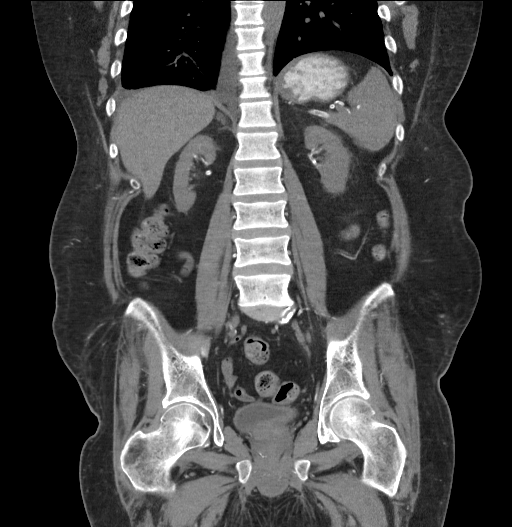

[17 of 46 positions shown; findings below may reference images not displayed]

FINDINGS: Lower chest: Small to moderate-sized right pleural effusion is noted
with some overlying atelectasis. The heart is borderline enlarged.
No pericardial effusion. Age advanced coronary artery calcifications
are noted.

Hepatobiliary: No focal hepatic lesions are identified without
contrast. The gallbladder is surgically absent. No common bile duct
dilatation.

Pancreas: No mass, inflammation or ductal dilatation.

Spleen: Normal size.  No focal lesions.

Adrenals/Urinary Tract: The adrenal glands are unremarkable. Both
kidneys are small and demonstrate extensive renal artery
calcifications. No worrisome renal lesions or hydronephrosis. The
bladder is unremarkable.

Stomach/Bowel: The stomach, duodenum, small bowel and colon are
grossly normal. No acute inflammatory changes, mass lesions or
obstructive findings. The terminal ileum is normal. The appendix is
normal.

Vascular/Lymphatic: Advanced vascular calcifications. No aneurysm.
Scattered small mesenteric and retroperitoneal lymph nodes but no
mass or overt adenopathy. No pelvic adenopathy.

Reproductive: The prostate gland and seminal vesicles are
unremarkable.

Other: No pelvic mass or adenopathy. No free pelvic fluid
collections. No inguinal mass or adenopathy. No abdominal wall
hernia or subcutaneous lesions.

Musculoskeletal: No significant bony findings.
IMPRESSION: 1. Small to moderate-sized right pleural effusion with minimal
overlying atelectasis.
2. No acute abdominal/pelvic findings, mass lesions or adenopathy.
3. Status post cholecystectomy.  No biliary dilatation.
4. Advanced vascular calcifications and small kidneys.

## 2018-11-02 IMAGING — NM NM PULMONARY PERF PARTICULATE
8 series · 8 of 8 positions shown · non-contrast
Comparison: Chest x-ray earlier today

CLINICAL DATA: Positive D-dimer, shortness of breath

EXAM:
NUCLEAR MEDICINE PERFUSION LUNG SCAN
TECHNIQUE: Perfusion images were obtained in multiple projections after
intravenous injection of radiopharmaceutical.
Ventilation scans intentionally deferred if perfusion scan and chest
x-ray adequate for interpretation during COVID 19 epidemic.
RADIOPHARMACEUTICALS:  1.52 mCi [PB] MAA IV

[Series 1: ant/post perf · 4.14mm/px · 1 of 1 slices shown (1 of 2)]
[im 1/1]
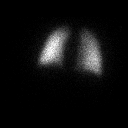

[Series 1: ant/post perf · 4.14mm/px · 1 of 1 slices shown (2 of 2)]
[im 1/1]
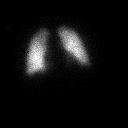

[Series 2: lao/rpo perf · 4.14mm/px · 1 of 1 slices shown (1 of 2)]
[im 1/1]
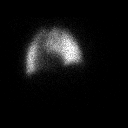

[Series 2: lao/rpo perf · 4.14mm/px · 1 of 1 slices shown (2 of 2)]
[im 1/1]
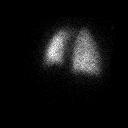

[Series 3: lt lat/rt lat perf · 4.14mm/px · 1 of 1 slices shown (1 of 2)]
[im 1/1]
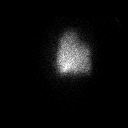

[Series 3: lt lat/rt lat perf · 4.14mm/px · 1 of 1 slices shown (2 of 2)]
[im 1/1]
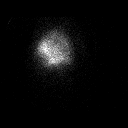

[Series 4: lpo/rao perf · 4.14mm/px · 1 of 1 slices shown (1 of 2)]
[im 1/1]
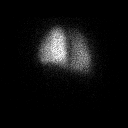

[Series 4: lpo/rao perf · 4.14mm/px · 1 of 1 slices shown (2 of 2)]
[im 1/1]
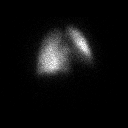

[8 of 8 positions shown; findings below may reference images not displayed]

FINDINGS: There are no perfusion defects noted.
IMPRESSION: Normal perfusion study.  No evidence of pulmonary embolus.

## 2018-11-02 IMAGING — CR DG CHEST 1V PORT
1 series · 1 of 1 positions shown · non-contrast
Comparison: Single-view of the chest [DATE].

CLINICAL DATA: Shortness of breath and body aches for 3-4 days. The
patient missed dialysis this week.

EXAM:
PORTABLE CHEST 1 VIEW

[portable]
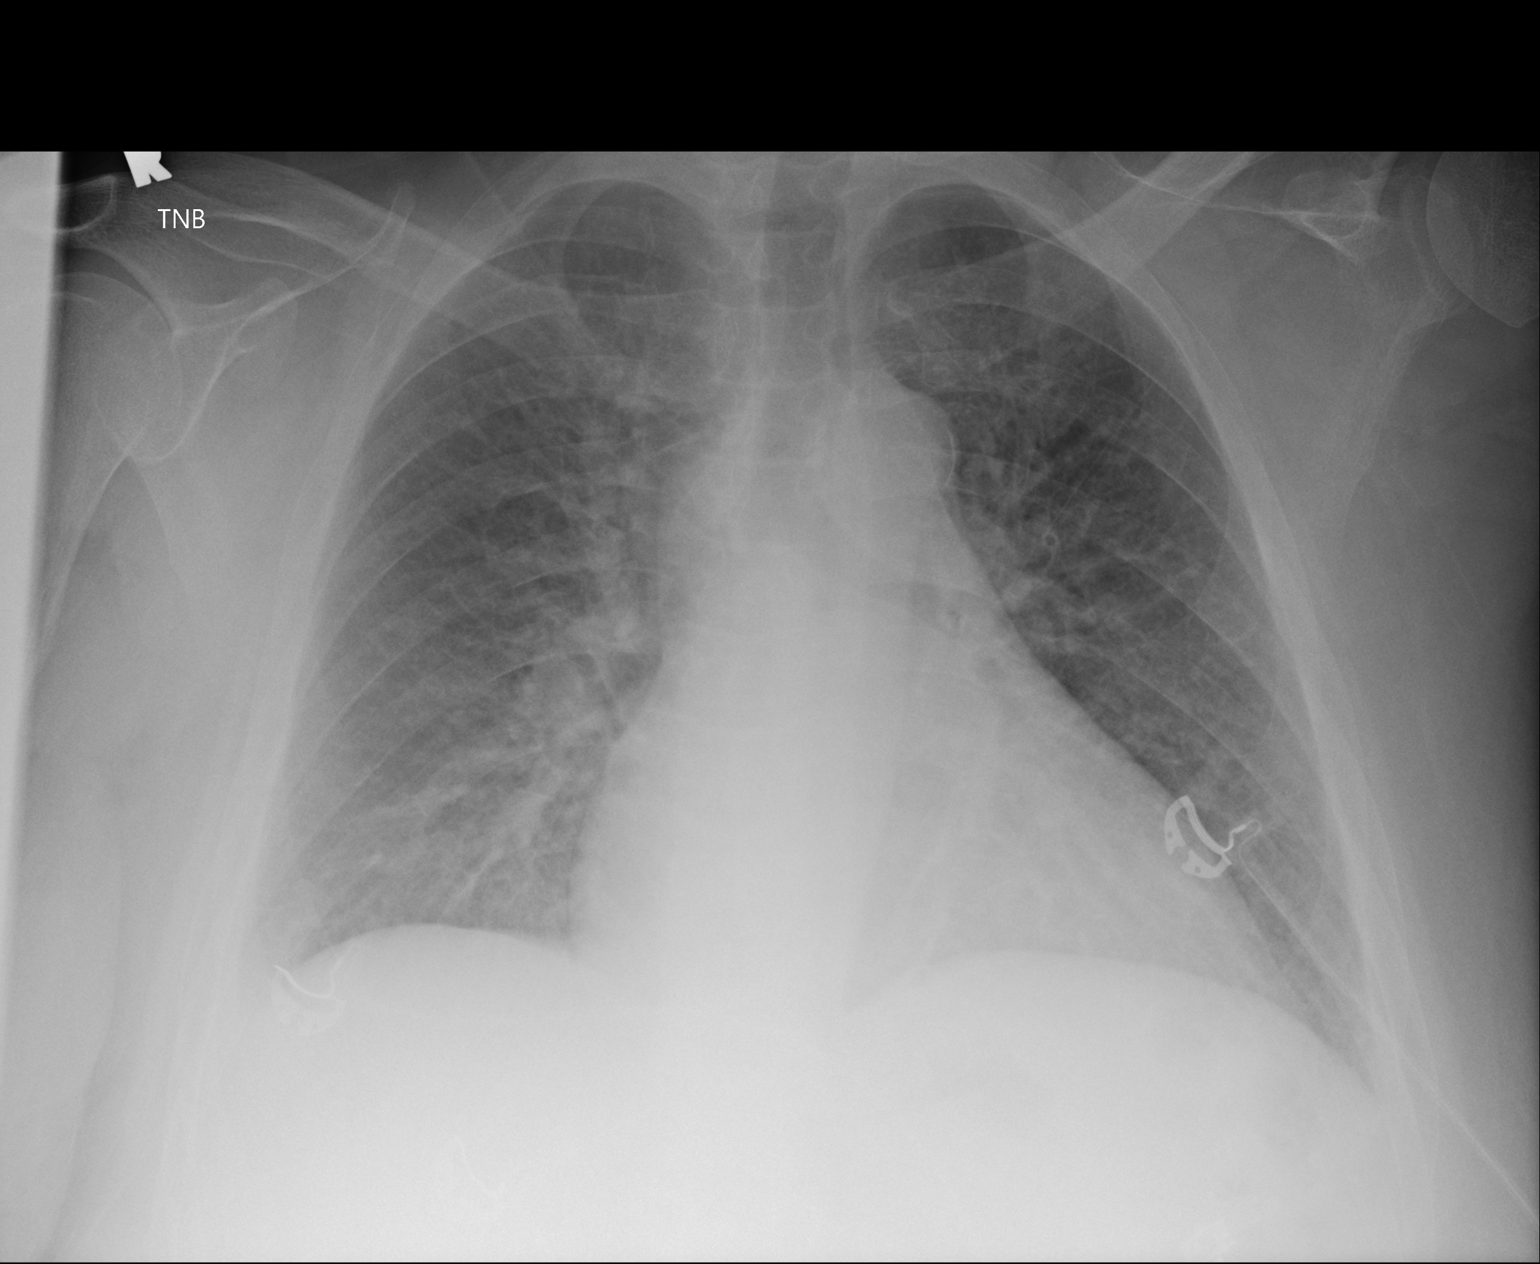

[1 of 1 positions shown; findings below may reference images not displayed]

FINDINGS: There is cardiomegaly and pulmonary edema. No consolidative process,
pneumothorax or pleural effusion.
IMPRESSION: Cardiomegaly and pulmonary edema.

## 2018-11-02 MED ORDER — ALBUTEROL SULFATE HFA 108 (90 BASE) MCG/ACT IN AERS
4.0000 | INHALATION_SPRAY | Freq: Once | RESPIRATORY_TRACT | Status: AC
  Administered 2018-11-02: 08:00:00 4 via RESPIRATORY_TRACT
  Filled 2018-11-02: qty 6.7

## 2018-11-02 MED ORDER — ACETAMINOPHEN 325 MG PO TABS: 650.0000 mg | ORAL_TABLET | Freq: Four times a day (QID) | ORAL | Status: DC | PRN

## 2018-11-02 MED ORDER — SODIUM CHLORIDE 0.9 % IV SOLN: 100.0000 mL | INTRAVENOUS | Status: DC | PRN

## 2018-11-02 MED ORDER — LIDOCAINE-PRILOCAINE 2.5-2.5 % EX CREA: 1.0000 "application " | TOPICAL_CREAM | CUTANEOUS | Status: DC | PRN

## 2018-11-02 MED ORDER — RENA-VITE PO TABS
1.0000 | ORAL_TABLET | Freq: Every day | ORAL | Status: DC
  Administered 2018-11-03: 12:00:00 1 via ORAL
  Filled 2018-11-02 (×5): qty 1

## 2018-11-02 MED ORDER — ACETAMINOPHEN 650 MG RE SUPP: 650.0000 mg | Freq: Four times a day (QID) | RECTAL | Status: DC | PRN

## 2018-11-02 MED ORDER — SODIUM ZIRCONIUM CYCLOSILICATE 5 G PO PACK
10.0000 g | PACK | Freq: Once | ORAL | Status: AC
  Administered 2018-11-02: 10 g via ORAL
  Filled 2018-11-02: qty 2

## 2018-11-02 MED ORDER — HEPARIN SODIUM (PORCINE) 5000 UNIT/ML IJ SOLN
5000.0000 [IU] | Freq: Three times a day (TID) | INTRAMUSCULAR | Status: DC
  Administered 2018-11-02 – 2018-11-03 (×2): 5000 [IU] via SUBCUTANEOUS
  Filled 2018-11-02 (×3): qty 1

## 2018-11-02 MED ORDER — TECHNETIUM TO 99M ALBUMIN AGGREGATED
1.5000 | Freq: Once | INTRAVENOUS | Status: AC
  Administered 2018-11-02: 19:00:00 1.52 via INTRAVENOUS

## 2018-11-02 MED ORDER — ONDANSETRON HCL 4 MG PO TABS: 4.0000 mg | ORAL_TABLET | Freq: Four times a day (QID) | ORAL | Status: DC | PRN

## 2018-11-02 MED ORDER — CALCIUM GLUCONATE 10 % IV SOLN
1.0000 g | Freq: Once | INTRAVENOUS | Status: AC
  Administered 2018-11-02: 1 g via INTRAVENOUS
  Filled 2018-11-02: qty 10

## 2018-11-02 MED ORDER — HEPARIN SODIUM (PORCINE) 1000 UNIT/ML DIALYSIS
20.0000 [IU]/kg | INTRAMUSCULAR | Status: DC | PRN
  Filled 2018-11-02: qty 3

## 2018-11-02 MED ORDER — SODIUM CHLORIDE 0.9 % IV SOLN
INTRAVENOUS | Status: DC | PRN
  Administered 2018-11-02: 09:00:00 500 mL via INTRAVENOUS

## 2018-11-02 MED ORDER — ATORVASTATIN CALCIUM 10 MG PO TABS
10.0000 mg | ORAL_TABLET | Freq: Every evening | ORAL | Status: DC
  Administered 2018-11-02: 18:00:00 10 mg via ORAL
  Filled 2018-11-02: qty 1

## 2018-11-02 MED ORDER — LIDOCAINE HCL (PF) 1 % IJ SOLN: 5.0000 mL | INTRAMUSCULAR | Status: DC | PRN

## 2018-11-02 MED ORDER — CALCIUM GLUCONATE-NACL 1-0.675 GM/50ML-% IV SOLN
1.0000 g | Freq: Once | INTRAVENOUS | Status: AC
  Administered 2018-11-02: 1000 mg via INTRAVENOUS
  Filled 2018-11-02: qty 50

## 2018-11-02 MED ORDER — ASPIRIN 81 MG PO CHEW
81.0000 mg | CHEWABLE_TABLET | Freq: Every day | ORAL | Status: DC
  Administered 2018-11-02 – 2018-11-03 (×2): 81 mg via ORAL
  Filled 2018-11-02 (×2): qty 1

## 2018-11-02 MED ORDER — INSULIN ASPART 100 UNIT/ML IV SOLN
5.0000 [IU] | Freq: Once | INTRAVENOUS | Status: AC
  Administered 2018-11-02: 5 [IU] via INTRAVENOUS

## 2018-11-02 MED ORDER — CHLORHEXIDINE GLUCONATE CLOTH 2 % EX PADS: 6.0000 | MEDICATED_PAD | Freq: Every day | CUTANEOUS | Status: DC

## 2018-11-02 MED ORDER — PENTAFLUOROPROP-TETRAFLUOROETH EX AERO: 1.0000 "application " | INHALATION_SPRAY | CUTANEOUS | Status: DC | PRN

## 2018-11-02 MED ORDER — DEXTROSE 50 % IV SOLN
1.0000 | Freq: Once | INTRAVENOUS | Status: AC
  Administered 2018-11-02: 08:00:00 50 mL via INTRAVENOUS
  Filled 2018-11-02: qty 50

## 2018-11-02 MED ORDER — INFLUENZA VAC SPLIT QUAD 0.5 ML IM SUSY
0.5000 mL | PREFILLED_SYRINGE | INTRAMUSCULAR | Status: AC
  Administered 2018-11-03: 0.5 mL via INTRAMUSCULAR
  Filled 2018-11-02: qty 0.5

## 2018-11-02 MED ORDER — ONDANSETRON HCL 4 MG/2ML IJ SOLN: 4.0000 mg | Freq: Four times a day (QID) | INTRAMUSCULAR | Status: DC | PRN

## 2018-11-02 NOTE — ED Provider Notes (Signed)
[facility] Provider Note   CSN: [id number] Arrival date & time:      Time seen 6:20 AM  History   Chief Complaint Chief Complaint  Patient presents with  . Shortness of Breath    HPI Brendan Campbell is a 48 y.o. male.     HPI patient states he has dialysis on Mondays, Wednesday, and Fridays (later today) at [facility] in [place].  He states he missed Wednesday because he felt too bad.  He states Tuesday the 22nd he started having a cough, rhinorrhea, headache, body aches, mild sore throat, and lost his taste on the 23rd.  He has had some nausea without vomiting or diarrhea.  He has diffuse abdominal pain that he describes as pressure.  He states he has a "funny feeling" in his chest.  He states he feels like his heart is beating faster than normal.  He states he feels short of breath.  He denies any swelling of his extremities.  He denies any known fever.  Patient is from [facility] facility which has had an outbreak of [place].  PCP System, Pcp Not In Nephrology Dr Hinda Lenis  Past Medical History:  Diagnosis Date  . Anemia   . Anxiety   . Blood transfusion without reported diagnosis   . CHF (congestive heart failure) ([place])   . Chronic kidney disease   . Depression   . Diabetes mellitus without complication ([place])   . End stage renal disease ([place])    M/W/F [facility]  . Hyperlipidemia   . Neuropathy   . Peripheral vascular disease ([place])   . PTSD (post-traumatic stress disorder)   . Wears glasses    reading    Patient Active Problem List   Diagnosis Date Noted  . Hyperkalemia 10/05/2018  . Weakness 10/05/2018  . Chest pain 05/10/2016  . ESRD on dialysis ([place]) 05/10/2016  . Anemia due to end stage renal disease ([place]) 05/10/2016  . 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 Lumbar radiculopathy 03/08/2016  . S/P below knee amputation, right ([place]) 03/05/2015  . Chronic renal insufficiency, stage IV (severe) ([place]) 03/04/2015  . DM2 (diabetes  mellitus, type 2) ([place]) 03/03/2015  . HLD (hyperlipidemia) 03/03/2015  . Back pain 03/03/2015    Past Surgical History:  Procedure Laterality Date  . A/V FISTULAGRAM N/A 10/09/2018   Procedure: A/V FISTULAGRAM;  Surgeon: Serafina Mitchell, MD;  Location: [facility];  Service: Cardiovascular;  Laterality: N/A;  . AV FISTULA PLACEMENT Left 09/22/2016   Procedure: CREATION OF LEFT ARM ARTERIOVENOUS (AV) FISTULA;  Surgeon: Waynetta Sandy, MD;  Location: [facility];  Service: Vascular;  Laterality: Left;  . AV FISTULA PLACEMENT Left 10/31/2017   Procedure: INSERTION OF ARTERIOVENOUS (AV) GORE-TEX 4-83m STETCH GRAFT LEFT ARM;  Surgeon: CWaynetta Sandy MD;  [place] [facility]  Service: Vascular;  Laterality: Left;  . BASCILIC VEIN TRANSPOSITION Left 08/17/2017   Procedure: SECOND STAGE BASILIC VEIN TRANSPOSITION LEFT ARM;  Surgeon: CWaynetta Sandy MD;  [place] [facility]  Service: Vascular;  Laterality: Left;  . BELOW KNEE LEG AMPUTATION Right   . CHOLECYSTECTOMY    . FOOT SURGERY    . IR FLUORO GUIDE CV LINE RIGHT  05/16/2016  . IR REMOVAL TUN CV CATH W/O FL  05/16/2016  . IR [place]ASC ACCESS RIGHT  05/16/2016  . PERIPHERAL VASCULAR BALLOON ANGIOPLASTY  10/09/2018   Procedure: PERIPHERAL VASCULAR BALLOON ANGIOPLASTY;  Surgeon: BSerafina Mitchell MD;  Location:  [facility];  Service: Cardiovascular;;        Home Medications    Prior to Admission medications   Medication Sig Start Date End Date Taking? Authorizing Provider  amoxicillin (AMOXIL) 500 MG capsule Take 1 capsule (500 mg total) by mouth daily. 10/09/18   Aline August, MD  aspirin 81 MG chewable tablet Chew 81 mg by mouth daily.    [provider]  atorvastatin (LIPITOR) 10 MG tablet Take 1 tablet (10 mg total) by mouth every evening. Patient not taking: Reported on 10/05/2018 06/02/16   Timmothy Euler, MD  blood glucose meter kit and supplies KIT Dispense based on patient and insurance  preference. Use up to four times daily as directed. (FOR ICD-9 250.00, 250.01). 06/02/16   Timmothy Euler, MD  HYDROcodone-acetaminophen (NORCO/VICODIN) 5-325 MG tablet Take 1 tablet by mouth every 6 (six) hours as needed for moderate pain or severe pain. 10/09/18   Aline August, MD  multivitamin (RENA-VIT) TABS tablet Take 1 tablet by mouth daily. 08/04/18   [provider]  ondansetron (ZOFRAN) 4 MG tablet Take 1 tablet (4 mg total) by mouth every 6 (six) hours as needed for nausea. 10/09/18   Aline August, MD  polyethylene glycol powder (GLYCOLAX/MIRALAX) powder 1/2 to 2 capfuls daily for constipation. Patient not taking: Reported on 10/11/2017 06/02/16   Timmothy Euler, MD  SURE COMFORT PEN NEEDLES 31G X 5 MM MISC USE AS DIRECTED DAILY. 11/07/16   Timmothy Euler, MD    Family History Family History  Problem Relation Age of Onset  . Cancer Mother        lung  . Diabetes Mother   . Heart attack Father   . Diabetes Father   . Diabetes Sister     Social History Social History   Tobacco Use  . Smoking status: Former Smoker    Quit date: 09/03/2006    Years since quitting: 12.1  . Smokeless tobacco: Never Used  Substance Use Topics  . Alcohol use: No  . Drug use: No  lives in a nursing facility   Allergies   Tape   Review of Systems Review of Systems  All other systems reviewed and are negative.    Physical Exam Updated Vital Signs BP (!) 141/80 (BP Location: Left Arm)   Pulse (!) 114   Temp 98.5 F (36.9 C) (Oral)   Resp 20   Ht _0  (1.753 m)   Wt 104 kg   SpO2 96%   BMI 33.86 kg/m   Physical Exam Vitals signs and nursing note reviewed.  Constitutional:      General: He is not in acute distress.    Appearance: Normal appearance. He is well-developed. He is not ill-appearing or toxic-appearing.  HENT:     Head: Normocephalic and atraumatic.     Right Ear: External ear normal.     Left Ear: External ear normal.     Nose: Nose normal. No  mucosal edema or rhinorrhea.     Mouth/Throat:     Dentition: No dental abscesses.     Pharynx: No uvula swelling.  Eyes:     Extraocular Movements: Extraocular movements intact.     Conjunctiva/sclera: Conjunctivae normal.     Pupils: Pupils are equal, round, and reactive to light.  Neck:     Musculoskeletal: Full passive range of motion without pain, normal range of motion and neck supple.  Cardiovascular:     Rate and Rhythm: Normal rate and  regular rhythm.     Heart sounds: Normal heart sounds. No murmur. No friction rub. No gallop.   Pulmonary:     Effort: Pulmonary effort is normal. No respiratory distress.     Breath sounds: Normal breath sounds. Decreased air movement present. No wheezing, rhonchi or rales.  Chest:     Chest wall: No tenderness or crepitus.  Abdominal:     General: Bowel sounds are normal. There is no distension.     Palpations: Abdomen is soft.     Tenderness: There is no abdominal tenderness. There is no guarding or rebound.  Musculoskeletal: Normal range of motion.        General: No tenderness.     Comments: Moves all extremities well.  Patient is status post right BKA.  Patient has a fistula in his left upper extremity with good bruit and thrill.  Skin:    General: Skin is warm and dry.     Capillary Refill: Capillary refill takes less than 2 seconds.     Coloration: Skin is not pale.     Findings: No erythema or rash.     Comments: Patient has the grayish skin coloration typical of dialysis patients.  Neurological:     General: No focal deficit present.     Mental Status: He is alert and oriented to person, place, and time.     Cranial Nerves: No cranial nerve deficit.  Psychiatric:        Mood and Affect: Mood normal. Mood is not anxious.        Speech: Speech normal.        Behavior: Behavior normal.        Thought Content: Thought content normal.      ED Treatments / Results  Labs (all labs ordered are listed, but only abnormal results  are displayed)    Labs pending    EKG EKG Interpretation  Date/Time:  Friday November 02 2018 06:19:34 EDT Ventricular Rate:  111 PR Interval:    QRS Duration: 174 QT Interval:  428 QTC Calculation: 582 R Axis:   141 Text Interpretation:  Junctional tachycardia Nonspecific intraventricular conduction delay Lateral infarct, acute Probable anteroseptal infarct, recent Since last tracing 09 Oct 2018 axis has changed ? limb lead reversal peaked T waves present Confirmed by Rolland Porter [phone]) on 11/02/2018 6:35:45 AM   Radiology No results found.   PCXR pending   Procedures Procedures (including critical care time)  Medications Ordered in ED Medications - No data to display   Initial Impression / Assessment and Plan / ED Course  I have reviewed the triage vital signs and the nursing notes.  Pertinent labs & imaging results that were available during my care of the patient were reviewed by me and considered in my medical decision making (see chart for details).      Patient is at risk for having COVID.  He has some symptoms that could be from  [place].  He is also a dialysis patient who has missed dialysis this week.  His symptoms also could be consistent with some fluid overload and hyperkalemia which he has had before and recently.  Linus Orn testing was done.  Laboratory testing was started and portable chest x-ray was ordered.  Patient was left with Dr. Reather Converse at change of shift to get the results of his tests  Final Clinical Impressions(s) / ED Diagnoses   Final diagnoses:  ESRD on hemodialysis ([place])  Shortness of breath    Disposition pending  Rolland Porter, MD, Barbette Or, MD 11/02/18 [phone]

## 2018-11-02 NOTE — ED Provider Notes (Signed)
Patient care signed out to follow-up blood work and reassess.  Patient dialysis history and has not dialyzed since Monday.   Patient has tachycardia and mild tachypnea on exam.  Patient chest x-ray reviewed consistent with mild pulmonary edema.  Blood work reviewed and potassium 7.8.  Insulin, albuterol via inhaler and calcium ordered immediately.  Nephrology and hospitalist consulted for dialysis and admission.  .Critical Care Performed by: Elnora Morrison, MD Authorized by: Elnora Morrison, MD   Critical care provider statement:    Critical care time (minutes):  40   Critical care start time:  11/02/2018 7:10 AM   Critical care end time:  11/02/2018 7:45 AM   Critical care time was exclusive of:  Separately billable procedures and treating other patients and teaching time   Critical care was necessary to treat or prevent imminent or life-threatening deterioration of the following conditions:  Metabolic crisis   Critical care was time spent personally by me on the following activities:  Discussions with consultants, evaluation of patient's response to treatment, examination of patient, ordering and performing treatments and interventions, ordering and review of laboratory studies, ordering and review of radiographic studies, pulse oximetry, re-evaluation of patient's condition, obtaining history from patient or surrogate and review of old charts    The patients results and plan were reviewed and discussed.   Any x-rays performed were independently reviewed by myself.   Differential diagnosis were considered with the presenting HPI.  Medications  calcium gluconate inj 10% (1 g) URGENT USE ONLY! (has no administration in time range)  insulin aspart (novoLOG) injection 5 Units (has no administration in time range)    And  dextrose 50 % solution 50 mL (has no administration in time range)  albuterol (VENTOLIN HFA) 108 (90 Base) MCG/ACT inhaler 4 puff (has no administration in time range)     Vitals:   11/02/18 0620 11/02/18 0621 11/02/18 0630  BP: (!) 141/80    Pulse: (!) 114    Resp: 20    Temp: 98.5 F (36.9 C)    TempSrc: Oral    SpO2:   96%  Weight:  104 kg   Height:  5\' 9"  (1.753 m)     Final diagnoses:  ESRD on hemodialysis (H[facility])  Shortness of breath  Acute pulmonary edema (H[facility])  Hyperkalemia    Admission/ observation were discussed with the admitting physician, patient and/or family and they are comfortable with the plan.      Elnora Morrison, MD 11/05/18 [phone]

## 2018-11-02 NOTE — ED Triage Notes (Signed)
Per ems, pt has been sob since Tuesday and having bodyaches. Per ems pt oxygen sat 96%. Pts roommate works at [employer].

## 2018-11-02 NOTE — ED Notes (Signed)
Report given to 3A 

## 2018-11-02 NOTE — Progress Notes (Signed)
Patient spoke to this nurse and stated "I'm not drinking both those contrasts, that's all my fluid intake for the whole day!" This nurse explained the purpose and importance of contrast. Patient asked about IV contrast and this nurse informed him of the risk with ESRD. This nurse also told patient ordered physician has taken into account ESRD when ordering PO contrast. When nurse told patient to continue drinking contrast, patient replied "hmm.." Will continue to monitor.

## 2018-11-02 NOTE — H&P (Signed)
History and Physical  Brendan Campbell ZSM:[id number] DOB: 05-17-1970 DOA: 11/02/2018   PCP: System, Pcp Not In   Patient coming from: Home  Chief Complaint: Shortness of breath  HPI:  Brendan Campbell is a 48 y.o. male with medical history of ESRD (MWF) presenting with subjective fevers, chills, myalgias, cough, and rhinorrhea that started on 10/30/2018.  Because he was feeling better, the patient decided to skip dialysis on 10/31/2018.  The patient began developing worsening cough and shortness of breath on 11/01/2018.  As result, he presented for further evaluation.  He also complains of his heart racing and chest discomfort.  He states that this occurs even at rest and has been essentially constant since 11/01/2018.  He denies any vomiting but complains of some nausea.  He complains of some lower abdominal pain that began on 11/01/2018 with loose stools.  There is no hematochezia or melena.  He states that his roommate works at [employer], but he states that she has been tested negative this past week for COVID-19.  Nevertheless, the patient stated that he had attended dialysis on 10/29/2018, and he stated the full session.  He denies any other COVID contacts.  Notably, the patient was recent admitted to the hospital from 10/05/2018 to 10/09/2018 at which time he had fluid overload and hyperkalemia as well secondary to missing dialysis.  He had AV fistula malfunction and was seen by vein and vascular surgery, Dr. Trula Slade.  He underwent a fistulogram and venoplasty.  He was discharged home with Augmentin for concerns of a dental abscess.  In the emergency department, the patient was afebrile hemodynamically stable saturating 100% room air.  He was mala tachycardic with heart rate 110s.  Potassium was 7.8 with serum creatinine 10.43.  LFTs were unremarkable.  WBC was 13.4 with hemoglobin 9.3.  EKG shows junctional tachycardia with left bundle branch block.  Chest x-ray showed increased vascular congestion.   Troponin was 32.  Nephrology was consulted to assist with management.  The patient was given temporizing measures for his hyperkalemia.  Assessment/Plan: Hyperkalemia -In part due to the patient missing dialysis on 10/31/2018 -There is likely a degree of poor compliance with diet -Nephrology consulted -Case discussed with Dr. Pearson Grippe -Plan for dialysis today -Temporizing measures given -give Lokelma  Fluid overload -Secondary to missing dialysis as discussed above -Plan for dialysis today  ESRD -Receives dialysis at [place], M-W-F -Metabolic bone disease per nephrology  Atypical chest pain -Cycle troponins -Initial troponin 32--not consistent with ACS -Echocardiogram  Abdominal pain/loose stools -CT abdomen and pelvis  Leukocytosis -Likely stress demargination -Patient is afebrile hemodynamically stable -Check procalcitonin -Blood cultures x2 sets -Monitor clinically without antibiotics presently  Hyperlipidemia -Continue statin  Diabetes mellitus type 2 -Glycemic control has improved -Currently not on any further agents -10/05/2018 hemoglobin A1c 5.2  Left bundle branch block -This appears to be chronic  Right BKA status     Past Medical History:  Diagnosis Date  . Anemia   . Anxiety   . Blood transfusion without reported diagnosis   . CHF (congestive heart failure) ([place])   . Chronic kidney disease   . Depression   . Diabetes mellitus without complication ([place])   . End stage renal disease ([place])    M/W/F [facility]  . Hyperlipidemia   . Neuropathy   . Peripheral vascular disease ([place])   . PTSD (post-traumatic stress disorder)   . Wears glasses    reading   Past Surgical  History:  Procedure Laterality Date  . A/V FISTULAGRAM N/A 10/09/2018   Procedure: A/V FISTULAGRAM;  Surgeon: Serafina Mitchell, MD;  Location: [place] [facility];  Service: Cardiovascular;  Laterality: N/A;  . AV FISTULA PLACEMENT Left 09/22/2016   Procedure: CREATION OF LEFT  ARM ARTERIOVENOUS (AV) FISTULA;  Surgeon: Waynetta Sandy, MD;  Location: [place];  Service: Vascular;  Laterality: Left;  . AV FISTULA PLACEMENT Left 10/31/2017   Procedure: INSERTION OF ARTERIOVENOUS (AV) GORE-TEX 4-65m STETCH GRAFT LEFT ARM;  Surgeon: CWaynetta Sandy MD;  [place]  Service: Vascular;  Laterality: Left;  . BASCILIC VEIN TRANSPOSITION Left 08/17/2017   Procedure: SECOND STAGE BASILIC VEIN TRANSPOSITION LEFT ARM;  Surgeon: CWaynetta Sandy MD;  [facility] [facility]  Service: Vascular;  Laterality: Left;  . BELOW KNEE LEG AMPUTATION Right   . CHOLECYSTECTOMY    . FOOT SURGERY    . IR FLUORO GUIDE CV LINE RIGHT  05/16/2016  . IR REMOVAL TUN CV CATH W/O FL  05/16/2016  . IR [place] ACCESS RIGHT  05/16/2016  . PERIPHERAL VASCULAR BALLOON ANGIOPLASTY  10/09/2018   Procedure: PERIPHERAL VASCULAR BALLOON ANGIOPLASTY;  Surgeon: BSerafina Mitchell MD;  [facility];  Service: Cardiovascular;;   Social History:  reports that he quit smoking about 12 years ago. He has never used smokeless tobacco. He reports that he does not drink alcohol or use drugs.   Family History  Problem Relation Age of Onset  . Cancer Mother        lung  . Diabetes Mother   . Heart attack Father   . Diabetes Father   . Diabetes Sister      Allergies  Allergen Reactions  . Tape Other (See Comments)    Pulls skin off     Prior to Admission medications   Medication Sig Start Date End Date Taking? Authorizing Provider  amoxicillin (AMOXIL) 500 MG capsule Take 1 capsule (500 mg total) by mouth daily. 10/09/18   AAline August MD  aspirin 81 MG chewable tablet Chew 81 mg by mouth daily.    [provider]  atorvastatin (LIPITOR) 10 MG tablet Take 1 tablet (10 mg total) by mouth every evening. Patient not taking: Reported on 10/05/2018 06/02/16   BTimmothy Euler MD  blood glucose meter kit and supplies KIT Dispense based on patient and insurance  preference. Use up to four times daily as directed. (FOR ICD-9 250.00, 250.01). 06/02/16   BTimmothy Euler MD  HYDROcodone-acetaminophen (NORCO/VICODIN) 5-325 MG tablet Take 1 tablet by mouth every 6 (six) hours as needed for moderate pain or severe pain. 10/09/18   AAline August MD  multivitamin (RENA-VIT) TABS tablet Take 1 tablet by mouth daily. 08/04/18   [provider]  ondansetron (ZOFRAN) 4 MG tablet Take 1 tablet (4 mg total) by mouth every 6 (six) hours as needed for nausea. 10/09/18   AAline August MD  polyethylene glycol powder (GLYCOLAX/MIRALAX) powder 1/2 to 2 capfuls daily for constipation. Patient not taking: Reported on 10/11/2017 06/02/16   BTimmothy Euler MD  SURE COMFORT PEN NEEDLES 31G X 5 MM MISC USE AS DIRECTED DAILY. 11/07/16   BTimmothy Euler MD    Review of Systems:  Constitutional:  No weight loss, night sweats, Fevers, chills, fatigue.  Head&Eyes: No headache.  No vision loss.  No eye pain or scotoma ENT:  No Difficulty swallowing,Tooth/dental problems,Sore throat,  No ear ache, post nasal drip,  Cardio-vascular:  No Orthopnea, PND, swelling in lower extremities,  dizziness, palpitations  GI:  No  vomiting, diarrhea, loss of appetite, hematochezia, melena, heartburn, indigestion, Resp:   No coughing up of blood .No wheezing.No chest wall deformity  Skin:  no rash or lesions.  GU:  no dysuria, change in color of urine, no urgency or frequency. No flank pain.  Musculoskeletal:  No joint pain or swelling. No decreased range of motion. No back pain.  Psych:  No change in mood or affect. Neurologic:  no dysesthesia, no focal weakness, no vision loss. No syncope  Physical Exam: Vitals:   11/02/18 0620 11/02/18 0621 11/02/18 0630 11/02/18 0630  BP: (!) 141/80  135/85   Pulse: (!) 114  (!) 114   Resp: 20  (!) 21   Temp: 98.5 F (36.9 C)     TempSrc: Oral     SpO2:   98% 96%  Weight:  104 kg    Height:  _0  (1.753 m)     General:  A&O  x 3, NAD, nontoxic, pleasant/cooperative Head/Eye: No conjunctival hemorrhage, no icterus, [place]/AT, No nystagmus ENT:  No icterus,  No thrush, good dentition, no pharyngeal exudate Neck:  No masses, no lymphadenpathy, no bruits CV:  RRR, no rub, no gallop, no S3 Lung:  Bilateral crackles, no wheeze Abdomen: soft/NT, +BS, nondistended, no peritoneal signs Ext: No cyanosis, No rashes, No petechiae, No lymphangitis, No edema; R-BKA with prosthesis Neuro: CNII-XII intact, strength 4/5 in bilateral upper and lower extremities, no dysmetria  Labs on Admission:  Basic Metabolic Panel: Recent Labs  Lab 11/02/18 0701  NA 137  K 7.8*  CL 98  CO2 22  GLUCOSE 75  BUN 82*  CREATININE 10.43*  CALCIUM 7.7*   Liver Function Tests: Recent Labs  Lab 11/02/18 0701  AST 23  ALT 19  ALKPHOS 112  BILITOT 1.0  PROT 7.9  ALBUMIN 3.9   No results for input(s): LIPASE, AMYLASE in the last 168 hours. No results for input(s): AMMONIA in the last 168 hours. CBC: Recent Labs  Lab 11/02/18 0701  WBC 13.4*  NEUTROABS 11.0*  HGB 9.3*  HCT 29.6*  MCV 108.8*  PLT 274   Coagulation Profile: No results for input(s): INR, PROTIME in the last 168 hours. Cardiac Enzymes: No results for input(s): CKTOTAL, CKMB, CKMBINDEX, TROPONINI in the last 168 hours. BNP: Invalid input(s): POCBNP CBG: No results for input(s): GLUCAP in the last 168 hours. Urine analysis: No results found for: COLORURINE, APPEARANCEUR, LABSPEC, PHURINE, GLUCOSEU, HGBUR, BILIRUBINUR, KETONESUR, PROTEINUR, UROBILINOGEN, NITRITE, LEUKOCYTESUR Sepsis Labs: _1 (procalcitonin:4,lacticidven:4) )No results found for this or any previous visit (from the past 240 hour(s)).   Radiological Exams on Admission: Dg Chest Port 1 View  Result Date: 11/02/2018 CLINICAL DATA:  Shortness of breath and body aches for 3-4 days. The patient missed dialysis this week. EXAM: PORTABLE CHEST 1 VIEW COMPARISON:  Single-view of the chest  10/05/2018. FINDINGS: There is cardiomegaly and pulmonary edema. No consolidative process, pneumothorax or pleural effusion. IMPRESSION: Cardiomegaly and pulmonary edema. Electronically Signed   By: Inge Rise M.D.   On: 11/02/2018 07:29    EKG: Independently reviewed. Junctional tachycardia, LBBB    Time spent:60 minutes Code Status:   FULL Family Communication:  No Family at bedside Disposition Plan: expect 1-2 day hospitalization Consults called: nephrology DVT Prophylaxis: Gerald Stabs, DO  [facility] Pager [phone]  If 7PM-7AM, please contact night-coverage [email or web address] Password [email or web address] 11/02/2018, 8:40 AM

## 2018-11-02 NOTE — Procedures (Signed)
    EMERGENT HEMODIALYSIS TREATMENT NOTE:  HD performed in ED due to K 7.8.  LUE AVG cannulated with moderate difficulty.  Able to achieve prescribed Qb 400 with AP -210 / VP 190.  4 hour low-heparin treatment completed without incident.  1K bath x 2 hours, then 2K for 2 hours.  Goal met: 2.9 L removed.  UF was suspended last 15 minutes d/t c/o leg cramps.  All blood was returned and hemostasis was achieved in 15 minutes.  Rockwell Alexandria, RN

## 2018-11-02 NOTE — Consult Note (Addendum)
[name] Admit Date: 11/02/2018 11/02/2018 [name] Requesting Physician:  Reather Converse MD  Reason for Consult:  ESRD Hyperkalemia HPI:  48 year old male ESRD MWF [facility] presented overnight with dyspnea, rhinorrhea, headaches, myalgias, dysgeusia since 9/23.  Patient has connection to [facility] where there is a COVID outbreak.  Patient also found to have hyperkalemia with a potassium of 7.8.  Patient did not go to dialysis on Wednesday, has a history of nonadherence.  EKG with a junctional tachycardia, prolonged QRS, and peaked T waves.  Chest x-ray features of pulmonary edema.  COVID test is pending.  Similar admission happened approximately 1 month ago.  He had infiltration of AV fistula with attempted dialysis and there was concern for recirculation.  He underwent angiogram on 9/1 with mild 50% stenosis, treated by angioplasty.  Patient denies that he has had any difficulty with cannulation since that time.  Patient without peripheral edema.  He offers no further history  ED has written for calcium, insulin/glucose, albuterol.  Serum bicarbonate is 22.  BUN is 82.  Balance of 12 systems is negative w/ exceptions as above  PMH  Past Medical History:  Diagnosis Date  . Anemia   . Anxiety   . Blood transfusion without reported diagnosis   . CHF (congestive heart failure) ([place])   . Chronic kidney disease   . Depression   . Diabetes mellitus without complication ([place])   . End stage renal disease ([place])    M/W/F [facility]  . Hyperlipidemia   . Neuropathy   . Peripheral vascular disease ([place])   . PTSD (post-traumatic stress disorder)   . Wears glasses    reading   [place]  Past Surgical History:  Procedure Laterality Date  . A/V FISTULAGRAM N/A 10/09/2018   Procedure: A/V FISTULAGRAM;  Surgeon: Serafina Mitchell, MD;  Location: [place] [facility];  Service: Cardiovascular;  Laterality: N/A;  . AV FISTULA PLACEMENT Left 09/22/2016   Procedure: CREATION OF LEFT ARM  ARTERIOVENOUS (AV) FISTULA;  Surgeon: Waynetta Sandy, MD;  Location: [place];  Service: Vascular;  Laterality: Left;  . AV FISTULA PLACEMENT Left 10/31/2017   Procedure: INSERTION OF ARTERIOVENOUS (AV) GORE-TEX 4-61m STETCH GRAFT LEFT ARM;  Surgeon: CWaynetta Sandy MD;  [place] [facility]  Service: Vascular;  Laterality: Left;  . BASCILIC VEIN TRANSPOSITION Left 08/17/2017   Procedure: SECOND STAGE BASILIC VEIN TRANSPOSITION LEFT ARM;  Surgeon: CWaynetta Sandy MD;  [place] [facility]  Service: Vascular;  Laterality: Left;  . BELOW KNEE LEG AMPUTATION Right   . CHOLECYSTECTOMY    . FOOT SURGERY    . IR FLUORO GUIDE CV LINE RIGHT  05/16/2016  . IR REMOVAL TUN CV CATH W/O FL  05/16/2016  . IR [place] VASC ACCESS RIGHT  05/16/2016  . PERIPHERAL VASCULAR BALLOON ANGIOPLASTY  10/09/2018   Procedure: PERIPHERAL VASCULAR BALLOON ANGIOPLASTY;  Surgeon: BSerafina Mitchell MD;  [facility];  Service: Cardiovascular;;   FH  Family History  Problem Relation Age of Onset  . Cancer Mother        lung  . Diabetes Mother   . Heart attack Father   . Diabetes Father   . Diabetes Sister    SH  reports that he quit smoking about 12 years ago. He has never used smokeless tobacco. He reports that he does not drink alcohol or use drugs. Allergies  Allergies  Allergen Reactions  . Tape Other (See Comments)  Pulls skin off   Home medications Prior to Admission medications   Medication Sig Start Date End Date Taking? Authorizing Provider  amoxicillin (AMOXIL) 500 MG capsule Take 1 capsule (500 mg total) by mouth daily. 10/09/18   Aline August, MD  aspirin 81 MG chewable tablet Chew 81 mg by mouth daily.    [provider]  atorvastatin (LIPITOR) 10 MG tablet Take 1 tablet (10 mg total) by mouth every evening. Patient not taking: Reported on 10/05/2018 06/02/16   Timmothy Euler, MD  blood glucose meter kit and supplies KIT Dispense based on patient and  insurance preference. Use up to four times daily as directed. (FOR ICD-9 250.00, 250.01). 06/02/16   Timmothy Euler, MD  HYDROcodone-acetaminophen (NORCO/VICODIN) 5-325 MG tablet Take 1 tablet by mouth every 6 (six) hours as needed for moderate pain or severe pain. 10/09/18   Aline August, MD  multivitamin (RENA-VIT) TABS tablet Take 1 tablet by mouth daily. 08/04/18   [provider]  ondansetron (ZOFRAN) 4 MG tablet Take 1 tablet (4 mg total) by mouth every 6 (six) hours as needed for nausea. 10/09/18   Aline August, MD  polyethylene glycol powder (GLYCOLAX/MIRALAX) powder 1/2 to 2 capfuls daily for constipation. Patient not taking: Reported on 10/11/2017 06/02/16   Timmothy Euler, MD  SURE COMFORT PEN NEEDLES 31G X 5 MM MISC USE AS DIRECTED DAILY. 11/07/16   Timmothy Euler, MD    Current Medications Scheduled Meds: . Chlorhexidine Gluconate Cloth  6 each Topical Q0600   Continuous Infusions: PRN Meds:.  CBC Recent Labs  Lab 11/02/18 0701  WBC 13.4*  NEUTROABS 11.0*  HGB 9.3*  HCT 29.6*  MCV 108.8*  PLT 213   Basic Metabolic Panel Recent Labs  Lab 11/02/18 0701  NA 137  K 7.8*  CL 98  CO2 22  GLUCOSE 75  BUN 82*  CREATININE 10.43*  CALCIUM 7.7*    Physical Exam  Blood pressure 135/85, pulse (!) 114, temperature 98.5 F (36.9 C), temperature source Oral, resp. rate (!) 21, height 5' 9"  (1.753 m), weight 104 kg, SpO2 96 %. GEN: Chronically ill-appearing, NAD ENT: NCAT EYES: EOMI CV: Tachycardic, regular, no murmur or rub PULM: Clear bilaterally on anterior auscultation ABD: Soft, nontender, no hepatosplenomegaly SKIN: No rashes or lesions, BKA present EXT: No edema Left upper arm AV fistula with bruit and thrill, superficial, no infiltration   Assessment 48 year old male presenting with upper and lower respiratory symptoms, concern for COVID; and recurrent severe life-threatening hyperkalemia; ESRD; driven by nonadherence.  1. Hyperkalemia:  Medical management now, dialysis immediately, start a 1K bath, arm appears amenable to cannulation. 2. ESRD, nonadherent, [facility].  Does not attend most Wednesdays, left upper arm AV fistula with angiogram and PTA of 50% stenosis 9/1 3. Symptoms concerning for COVID-19, rule out in process 4. PVD history of right BKA 5. DM 2  Plan 1. As above, immediate HD: 1K x2h, then 2K, 400/800, 4h, AVF. 15g, tight heparin 2. Medical management of hyperkalemia until then 3. Will give a second gram of calcium gluconate 4. Will need to continue to discuss with patient that his nonadherence is potentially life-threatening 5. We will follow along closely   [name]  [phone] pgr 11/02/2018, 8:31 AM

## 2018-11-02 NOTE — ED Notes (Signed)
Date and time results received: 11/02/18 0735 (use smartphrase ".now" to insert current time)  Test: potassium Critical Value: 7.8  Name of Provider Notified: Dr. Reather Converse  Orders Received? Or Actions Taken?: n/a

## 2018-11-02 NOTE — Procedures (Signed)
I was present at this dialysis session. I have reviewed the session itself and made appropriate changes.   15g x2, good AP, goal UF 3L, 1K bath.RS  Filed Weights   11/02/18 0621  Weight: 104 kg    Recent Labs  Lab 11/02/18 0701  NA 137  K 7.8*  CL 98  CO2 22  GLUCOSE 75  BUN 82*  CREATININE 10.43*  CALCIUM 7.7*    Recent Labs  Lab 11/02/18 0701  WBC 13.4*  NEUTROABS 11.0*  HGB 9.3*  HCT 29.6*  MCV 108.8*  PLT 274    Scheduled Meds: . Chlorhexidine Gluconate Cloth  6 each Topical Q0600   Continuous Infusions: . sodium chloride 500 mL (11/02/18 0918)  . calcium gluconate 1,000 mg (11/02/18 0919)   PRN Meds:.sodium chloride   Pearson Grippe  MD 11/02/2018, 10:14 AM

## 2018-11-02 NOTE — ED Notes (Signed)
Call to 3A.  Will call back once bed is approved

## 2018-11-03 ENCOUNTER — Observation Stay (HOSPITAL_BASED_OUTPATIENT_CLINIC_OR_DEPARTMENT_OTHER): Payer: Medicaid Other

## 2018-11-03 DIAGNOSIS — Z20828 Contact with and (suspected) exposure to other viral communicable diseases: Secondary | ICD-10-CM | POA: Diagnosis not present

## 2018-11-03 DIAGNOSIS — N186 End stage renal disease: Secondary | ICD-10-CM | POA: Diagnosis not present

## 2018-11-03 DIAGNOSIS — E875 Hyperkalemia: Secondary | ICD-10-CM | POA: Diagnosis not present

## 2018-11-03 DIAGNOSIS — R079 Chest pain, unspecified: Secondary | ICD-10-CM

## 2018-11-03 DIAGNOSIS — Z23 Encounter for immunization: Secondary | ICD-10-CM | POA: Diagnosis not present

## 2018-11-03 DIAGNOSIS — D631 Anemia in chronic kidney disease: Secondary | ICD-10-CM | POA: Diagnosis not present

## 2018-11-03 DIAGNOSIS — E1122 Type 2 diabetes mellitus with diabetic chronic kidney disease: Secondary | ICD-10-CM | POA: Diagnosis not present

## 2018-11-03 DIAGNOSIS — I340 Nonrheumatic mitral (valve) insufficiency: Secondary | ICD-10-CM | POA: Diagnosis not present

## 2018-11-03 DIAGNOSIS — R0602 Shortness of breath: Secondary | ICD-10-CM | POA: Diagnosis not present

## 2018-11-03 LAB — RENAL FUNCTION PANEL
Albumin: 3.5 g/dL (ref 3.5–5.0)
Anion gap: 12 (ref 5–15)
BUN: 35 mg/dL — ABNORMAL HIGH (ref 6–20)
CO2: 29 mmol/L (ref 22–32)
Calcium: 8.3 mg/dL — ABNORMAL LOW (ref 8.9–10.3)
Chloride: 95 mmol/L — ABNORMAL LOW (ref 98–111)
Creatinine, Ser: 5.53 mg/dL — ABNORMAL HIGH (ref 0.61–1.24)
GFR calc Af Amer: 13 mL/min — ABNORMAL LOW (ref >60)
GFR calc non Af Amer: 11 mL/min — ABNORMAL LOW (ref >60)
Glucose, Bld: 104 mg/dL — ABNORMAL HIGH (ref 70–99)
Phosphorus: 5.8 mg/dL — ABNORMAL HIGH (ref 2.5–4.6)
Potassium: 4.6 mmol/L (ref 3.5–5.1)
Sodium: 136 mmol/L (ref 135–145)

## 2018-11-03 LAB — ECHOCARDIOGRAM COMPLETE
Height: 69 in
Weight: 3065.28 oz

## 2018-11-03 LAB — CBC
HCT: 27.6 % — ABNORMAL LOW (ref 39.0–52.0)
Hemoglobin: 8.9 g/dL — ABNORMAL LOW (ref 13.0–17.0)
MCH: 34.1 pg — ABNORMAL HIGH (ref 26.0–34.0)
MCHC: 32.2 g/dL (ref 30.0–36.0)
MCV: 105.7 fL — ABNORMAL HIGH (ref 80.0–100.0)
Platelets: 270 10*3/uL (ref 150–400)
RBC: 2.61 MIL/uL — ABNORMAL LOW (ref 4.22–5.81)
RDW: 12.8 % (ref 11.5–15.5)
WBC: 6.9 10*3/uL (ref 4.0–10.5)
nRBC: 0 % (ref 0.0–0.2)

## 2018-11-03 LAB — HIV ANTIBODY (ROUTINE TESTING W REFLEX): HIV Screen 4th Generation wRfx: NONREACTIVE

## 2018-11-03 LAB — GLUCOSE, CAPILLARY
Glucose-Capillary: 62 mg/dL — ABNORMAL LOW (ref 70–99)
Glucose-Capillary: 91 mg/dL (ref 70–99)

## 2018-11-03 NOTE — Progress Notes (Signed)
Nsg Discharge Note  Admit Date:  11/02/2018 Discharge date: 11/03/2018   LC JOYNT to be D/C'd Home  per MD order.  AVS completed. Patient able to verbalize understanding.  Discharge Medication: Allergies as of 11/03/2018      Reactions   Tape Other (See Comments)   Pulls skin off      Medication List    TAKE these medications   aspirin 81 MG chewable tablet Chew 81 mg by mouth daily.   atorvastatin 10 MG tablet Commonly known as: LIPITOR Take 1 tablet (10 mg total) by mouth every evening.   blood glucose meter kit and supplies Kit Dispense based on patient and insurance preference. Use up to four times daily as directed. (FOR ICD-9 250.00, 250.01).   multivitamin Tabs tablet Take 1 tablet by mouth daily.   Sure Comfort Pen Needles 31G X 5 MM Misc Generic drug: Insulin Pen Needle USE AS DIRECTED DAILY.       Discharge Assessment: Vitals:   11/03/18 1200 11/03/18 1329  BP: 129/82 127/79  Pulse: 86 82  Resp: 14 16  Temp:  98.2 F (36.8 C)  SpO2: 100% 100%   Skin clean, dry and intact without evidence of skin break down, no evidence of skin tears noted. IV catheter discontinued intact. Site without signs and symptoms of complications - no redness or edema noted at insertion site, patient denies c/o pain - only slight tenderness at site.  Dressing with slight pressure applied.  D/c Instructions-Education: Discharge instructions given to patient with verbalized understanding. D/c education completed with patient including follow up instructions, medication list, d/c activities limitations if indicated, with other d/c instructions as indicated by MD - patient able to verbalize understanding, all questions fully answered. Patient instructed to return to ED, call 911, or call MD for any changes in condition.  Patient escorted via [place], and D/C home via private auto.  Tinsleigh Slovacek Loletha Grayer, RN 11/03/2018 2:52 PM

## 2018-11-03 NOTE — Progress Notes (Signed)
*  PRELIMINARY RESULTS* Echocardiogram 2D Echocardiogram has been performed.  Brendan Campbell 11/03/2018, 9:52 AM

## 2018-11-03 NOTE — Discharge Summary (Addendum)
Physician Discharge Summary  Brendan Campbell BUL:[id number] DOB: 19-Aug-1970 DOA: 11/02/2018  PCP: System, Pcp Not In  Admit date: 11/02/2018 Discharge date: 11/03/2018  Admitted From: Home Disposition:  Home   Recommendations for Outpatient Follow-up:  1. Follow up with PCP in 1-2 weeks 2. Please obtain BMP/CBC in one week    Discharge Condition: Stable CODE STATUS: FULL Diet recommendation: Renal   Brief/Interim Summary: 48 y.o. male with medical history of ESRD (MWF) presenting with subjective fevers, chills, myalgias, cough, and rhinorrhea that started on 10/30/2018.  Because he was feeling bad, the patient decided to skip dialysis on 10/31/2018 (Wed).  He is notorious for missing his Wednesday dialysis sessions.  The patient began developing worsening cough and shortness of breath on 11/01/2018.  As result, he presented for further evaluation.  He also complains of his heart racing and chest discomfort.  He states that this occurs even at rest and has been essentially constant since 11/01/2018.  He denies any vomiting but complains of some nausea.  He complains of some lower abdominal pain that began on 11/01/2018 with loose stools.  There is no hematochezia or melena.  He states that his roommate works at [employer], but he states that she has been tested negative this past week for COVID-19.  Nevertheless, the patient stated that he had attended dialysis on 10/29/2018, and he stated the full session.  He denies any other COVID contacts.  Notably, the patient was recent admitted to the hospital from 10/05/2018 to 10/09/2018 at which time he had fluid overload and hyperkalemia as well secondary to missing dialysis.  He had AV fistula malfunction and was seen by vein and vascular surgery, Dr. Trula Slade.  He underwent a fistulogram and venoplasty.  He was discharged home with Augmentin for concerns of a dental abscess.  In the emergency department, the patient was afebrile hemodynamically stable  saturating 100% room air.  He was mala tachycardic with heart rate 110s.  Potassium was 7.8 with serum creatinine 10.43.  LFTs were unremarkable.  WBC was 13.4 with hemoglobin 9.3.  EKG shows junctional tachycardia with left bundle branch block.  Chest x-ray showed increased vascular congestion.  Troponin was 32.  Nephrology was consulted to assist with management.  The patient was given temporizing measures for his hyperkalemia. He received full HD session in hospital on 11/02/18 with improvement of his symptoms and improvement in his electrolytes.  Troponins were not consistent with ACS.  He remained afebrile and hemodynamically stable throughout the hospitalization.  V/Q scan was neg for PE.  Blood cultures were negative at the time of d/c.  On the day of d/c, a thrill could not be palpated on his left arm AV graft.  The case was discussed with nephrology, Dr. Augustin Coupe.  Dr. Augustin Coupe evaluated the patient and felt the patient's graft was clotted.  Dr. Augustin Coupe felt the patient could be discharged, and he would assist in arranging for the patient to have his AV graft de-clotting on Monday 11/05/18 at his [facility] dialysis center.  Discharge Diagnoses:  Hyperkalemia -due to pt missing HD (he usually misses Wednesdays) -There is likely a degree of poor compliance with diet -Nephrology consulted -Case discussed with Dr. Thurmond Butts Sanford/Dr. Augustin Coupe -had full session on 9/25 -now K is normal  Fluid overload -Secondary to missing dialysis as discussed above -had full session on 9/25 -now stable on RA  AV Fistula Clot -case was discussed with nephrology, Dr. Augustin Coupe.  Dr. Augustin Coupe evaluated the patient and felt the  patient's graft was clotted.  Dr. Augustin Coupe felt the patient could be discharged, and he would assist in arranging for the patient to have his AV graft de-clotting on Monday 11/05/18 at his [facility] dialysis center.  ESRD -Receives dialysis at [facility], M-W-F -Metabolic bone disease per nephrology  Atypical chest pain -Cycle  troponins 32>>> 36>>>35>>>35 -not consistent with ACS -resolved -Echocardiogram--results pending at time of d/c -9/26 personally reviewed EKG--sinus, LBBB, QTc 506  Abdominal pain/loose stools -CT abdomen and pelvis--small to mod right pleural effusion. No acute intraabdominal findings -abd pain resolved and pt tolerated diet   Leukocytosis -Likely stress demargination -Patient is afebrile hemodynamically stable -Check procalcitonin--0.10 -Blood cultures x2 sets--neg at time of d/c -WBC improved 6.9 without abx -Monitor clinically without antibiotics presently  Hyperlipidemia -Continue statin  Diabetes mellitus type 2 -Glycemic control has improved -Currently not on any further agents -10/05/2018 hemoglobin A1c 5.2  Left bundle branch block -chronic upon review of prior EKGs  Right BKA status -pt with hx of PVD -continue aspirin  Discharge Instructions   Allergies as of 11/03/2018      Reactions   Tape Other (See Comments)   Pulls skin off      Medication List    TAKE these medications   aspirin 81 MG chewable tablet Chew 81 mg by mouth daily.   atorvastatin 10 MG tablet Commonly known as: LIPITOR Take 1 tablet (10 mg total) by mouth every evening.   blood glucose meter kit and supplies Kit Dispense based on patient and insurance preference. Use up to four times daily as directed. (FOR ICD-9 250.00, 250.01).   multivitamin Tabs tablet Take 1 tablet by mouth daily.   Sure Comfort Pen Needles 31G X 5 MM Misc Generic drug: Insulin Pen Needle USE AS DIRECTED DAILY.       Allergies  Allergen Reactions   Tape Other (See Comments)    Pulls skin off    Consultations:  renal   Procedures/Studies: Ct Abdomen Pelvis Wo Contrast  Result Date: 11/02/2018 CLINICAL DATA:  Abdominal pain.  Dialysis patient. EXAM: CT ABDOMEN AND PELVIS WITHOUT CONTRAST TECHNIQUE: Multidetector CT imaging of the abdomen and pelvis was performed following the standard  protocol without IV contrast. COMPARISON:  None. FINDINGS: Lower chest: Small to moderate-sized right pleural effusion is noted with some overlying atelectasis. The heart is borderline enlarged. No pericardial effusion. Age advanced coronary artery calcifications are noted. Hepatobiliary: No focal hepatic lesions are identified without contrast. The gallbladder is surgically absent. No common bile duct dilatation. Pancreas: No mass, inflammation or ductal dilatation. Spleen: Normal size.  No focal lesions. Adrenals/Urinary Tract: The adrenal glands are unremarkable. Both kidneys are small and demonstrate extensive renal artery calcifications. No worrisome renal lesions or hydronephrosis. The bladder is unremarkable. Stomach/Bowel: The stomach, duodenum, small bowel and colon are grossly normal. No acute inflammatory changes, mass lesions or obstructive findings. The terminal ileum is normal. The appendix is normal. Vascular/Lymphatic: Advanced vascular calcifications. No aneurysm. Scattered small mesenteric and retroperitoneal lymph nodes but no mass or overt adenopathy. No pelvic adenopathy. Reproductive: The prostate gland and seminal vesicles are unremarkable. Other: No pelvic mass or adenopathy. No free pelvic fluid collections. No inguinal mass or adenopathy. No abdominal wall hernia or subcutaneous lesions. Musculoskeletal: No significant bony findings. IMPRESSION: 1. Small to moderate-sized right pleural effusion with minimal overlying atelectasis. 2. No acute abdominal/pelvic findings, mass lesions or adenopathy. 3. Status post cholecystectomy.  No biliary dilatation. 4. Advanced vascular calcifications and small kidneys. Electronically Signed  By: Marijo Sanes M.D.   On: 11/02/2018 20:09   Ct Head Wo Contrast  Result Date: 10/05/2018 CLINICAL DATA:  Paresthesia. EXAM: CT HEAD WITHOUT CONTRAST TECHNIQUE: Contiguous axial images were obtained from the base of the skull through the vertex without  intravenous contrast. COMPARISON:  None. FINDINGS: Brain: Mild chronic ischemic white matter disease is noted. No mass effect or midline shift is noted. Ventricular size is within normal limits. There is no evidence of mass lesion, hemorrhage or acute infarction. Vascular: No hyperdense vessel or unexpected calcification. Skull: Normal. Negative for fracture or focal lesion. Sinuses/Orbits: No acute finding. Other: None. IMPRESSION: Mild chronic ischemic white matter disease. No acute intracranial abnormality seen. Electronically Signed   By: Marijo Conception M.D.   On: 10/05/2018 15:34   Nm Pulmonary Perfusion  Result Date: 11/02/2018 CLINICAL DATA:  Positive D-dimer, shortness of breath EXAM: NUCLEAR MEDICINE PERFUSION LUNG SCAN TECHNIQUE: Perfusion images were obtained in multiple projections after intravenous injection of radiopharmaceutical. Ventilation scans intentionally deferred if perfusion scan and chest x-ray adequate for interpretation during COVID 19 epidemic. RADIOPHARMACEUTICALS:  1.52 mCi Tc-38mMAA IV COMPARISON:  Chest x-ray earlier today FINDINGS: There are no perfusion defects noted. IMPRESSION: Normal perfusion study.  No evidence of pulmonary embolus. Electronically Signed   By: KRolm BaptiseM.D.   On: 11/02/2018 19:45   Dg Chest Port 1 View  Result Date: 11/02/2018 CLINICAL DATA:  Shortness of breath and body aches for 3-4 days. The patient missed dialysis this week. EXAM: PORTABLE CHEST 1 VIEW COMPARISON:  Single-view of the chest 10/05/2018. FINDINGS: There is cardiomegaly and pulmonary edema. No consolidative process, pneumothorax or pleural effusion. IMPRESSION: Cardiomegaly and pulmonary edema. Electronically Signed   By: TInge RiseM.D.   On: 11/02/2018 07:29   Dg Chest Portable 1 View  Result Date: 10/05/2018 CLINICAL DATA:  Shortness of breath.  The patient missed dialysis. EXAM: PORTABLE CHEST 1 VIEW COMPARISON:  April 24, 2018 FINDINGS: Stable cardiomegaly. The hila  and mediastinum are unchanged. No pneumothorax. No nodules or masses. No focal infiltrates. No overt edema. Mild pulmonary venous congestion suspected. IMPRESSION: Cardiomegaly and mild pulmonary venous congestion.  No overt edema. Electronically Signed   By: DDorise BullionIII M.D   On: 10/05/2018 12:40        Discharge Exam: Vitals:   11/03/18 1200 11/03/18 1329  BP: 129/82 127/79  Pulse: 86 82  Resp: 14 16  Temp:  98.2 F (36.8 C)  SpO2: 100% 100%   Vitals:   11/03/18 0626 11/03/18 0626 11/03/18 1200 11/03/18 1329  BP: 107/71 107/71 129/82 127/79  Pulse: 81 79 86 82  Resp: 20 20 14 16   Temp: 98.3 F (36.8 C) 98.3 F (36.8 C)  98.2 F (36.8 C)  TempSrc: Oral Oral  Oral  SpO2: 100% 97% 100% 100%  Weight:  86.9 kg    Height:        General: Pt is alert, awake, not in acute distress Cardiovascular: RRR, S1/S2 +, no rubs, no gallops Respiratory: bibasilar crackles, no wheeze Abdominal: Soft, NT, ND, bowel sounds + Extremities: no edema, no cyanosis; R-BKA   The results of significant diagnostics from this hospitalization (including imaging, microbiology, ancillary and laboratory) are listed below for reference.    Significant Diagnostic Studies: Ct Abdomen Pelvis Wo Contrast  Result Date: 11/02/2018 CLINICAL DATA:  Abdominal pain.  Dialysis patient. EXAM: CT ABDOMEN AND PELVIS WITHOUT CONTRAST TECHNIQUE: Multidetector CT imaging of the abdomen and pelvis was performed  following the standard protocol without IV contrast. COMPARISON:  None. FINDINGS: Lower chest: Small to moderate-sized right pleural effusion is noted with some overlying atelectasis. The heart is borderline enlarged. No pericardial effusion. Age advanced coronary artery calcifications are noted. Hepatobiliary: No focal hepatic lesions are identified without contrast. The gallbladder is surgically absent. No common bile duct dilatation. Pancreas: No mass, inflammation or ductal dilatation. Spleen: Normal size.   No focal lesions. Adrenals/Urinary Tract: The adrenal glands are unremarkable. Both kidneys are small and demonstrate extensive renal artery calcifications. No worrisome renal lesions or hydronephrosis. The bladder is unremarkable. Stomach/Bowel: The stomach, duodenum, small bowel and colon are grossly normal. No acute inflammatory changes, mass lesions or obstructive findings. The terminal ileum is normal. The appendix is normal. Vascular/Lymphatic: Advanced vascular calcifications. No aneurysm. Scattered small mesenteric and retroperitoneal lymph nodes but no mass or overt adenopathy. No pelvic adenopathy. Reproductive: The prostate gland and seminal vesicles are unremarkable. Other: No pelvic mass or adenopathy. No free pelvic fluid collections. No inguinal mass or adenopathy. No abdominal wall hernia or subcutaneous lesions. Musculoskeletal: No significant bony findings. IMPRESSION: 1. Small to moderate-sized right pleural effusion with minimal overlying atelectasis. 2. No acute abdominal/pelvic findings, mass lesions or adenopathy. 3. Status post cholecystectomy.  No biliary dilatation. 4. Advanced vascular calcifications and small kidneys. Electronically Signed   By: Marijo Sanes M.D.   On: 11/02/2018 20:09   Ct Head Wo Contrast  Result Date: 10/05/2018 CLINICAL DATA:  Paresthesia. EXAM: CT HEAD WITHOUT CONTRAST TECHNIQUE: Contiguous axial images were obtained from the base of the skull through the vertex without intravenous contrast. COMPARISON:  None. FINDINGS: Brain: Mild chronic ischemic white matter disease is noted. No mass effect or midline shift is noted. Ventricular size is within normal limits. There is no evidence of mass lesion, hemorrhage or acute infarction. Vascular: No hyperdense vessel or unexpected calcification. Skull: Normal. Negative for fracture or focal lesion. Sinuses/Orbits: No acute finding. Other: None. IMPRESSION: Mild chronic ischemic white matter disease. No acute  intracranial abnormality seen. Electronically Signed   By: Marijo Conception M.D.   On: 10/05/2018 15:34   Nm Pulmonary Perfusion  Result Date: 11/02/2018 CLINICAL DATA:  Positive D-dimer, shortness of breath EXAM: NUCLEAR MEDICINE PERFUSION LUNG SCAN TECHNIQUE: Perfusion images were obtained in multiple projections after intravenous injection of radiopharmaceutical. Ventilation scans intentionally deferred if perfusion scan and chest x-ray adequate for interpretation during COVID 19 epidemic. RADIOPHARMACEUTICALS:  1.52 mCi Tc-21mMAA IV COMPARISON:  Chest x-ray earlier today FINDINGS: There are no perfusion defects noted. IMPRESSION: Normal perfusion study.  No evidence of pulmonary embolus. Electronically Signed   By: KRolm BaptiseM.D.   On: 11/02/2018 19:45   Dg Chest Port 1 View  Result Date: 11/02/2018 CLINICAL DATA:  Shortness of breath and body aches for 3-4 days. The patient missed dialysis this week. EXAM: PORTABLE CHEST 1 VIEW COMPARISON:  Single-view of the chest 10/05/2018. FINDINGS: There is cardiomegaly and pulmonary edema. No consolidative process, pneumothorax or pleural effusion. IMPRESSION: Cardiomegaly and pulmonary edema. Electronically Signed   By: TInge RiseM.D.   On: 11/02/2018 07:29   Dg Chest Portable 1 View  Result Date: 10/05/2018 CLINICAL DATA:  Shortness of breath.  The patient missed dialysis. EXAM: PORTABLE CHEST 1 VIEW COMPARISON:  April 24, 2018 FINDINGS: Stable cardiomegaly. The hila and mediastinum are unchanged. No pneumothorax. No nodules or masses. No focal infiltrates. No overt edema. Mild pulmonary venous congestion suspected. IMPRESSION: Cardiomegaly and mild pulmonary venous congestion.  No overt edema. Electronically Signed   By: Dorise Bullion III M.D   On: 10/05/2018 12:40     Microbiology: Recent Results (from the past 240 hour(s))  SARS Coronavirus 2 [facility] order, Performed in [facility] hospital lab) Nasopharyngeal Nasopharyngeal Swab      Status: None   Collection Time: 11/02/18  6:37 AM   Specimen: Nasopharyngeal Swab  Result Value Ref Range Status   SARS Coronavirus 2 NEGATIVE NEGATIVE Final    Comment: (NOTE) If result is NEGATIVE SARS-CoV-2 target nucleic acids are NOT DETECTED. The SARS-CoV-2 RNA is generally detectable in upper and lower  respiratory specimens during the acute phase of infection. The lowest  concentration of SARS-CoV-2 viral copies this assay can detect is 250  copies / mL. A negative result does not preclude SARS-CoV-2 infection  and should not be used as the sole basis for treatment or other  patient management decisions.  A negative result may occur with  improper specimen collection / handling, submission of specimen other  than nasopharyngeal swab, presence of viral mutation(s) within the  areas targeted by this assay, and inadequate number of viral copies  (<250 copies / mL). A negative result must be combined with clinical  observations, patient history, and epidemiological information. If result is POSITIVE SARS-CoV-2 target nucleic acids are DETECTED. The SARS-CoV-2 RNA is generally detectable in upper and lower  respiratory specimens dur ing the acute phase of infection.  Positive  results are indicative of active infection with SARS-CoV-2.  Clinical  correlation with patient history and other diagnostic information is  necessary to determine patient infection status.  Positive results do  not rule out bacterial infection or co-infection with other viruses. If result is PRESUMPTIVE POSTIVE SARS-CoV-2 nucleic acids MAY BE PRESENT.   A presumptive positive result was obtained on the submitted specimen  and confirmed on repeat testing.  While 2019 novel coronavirus  (SARS-CoV-2) nucleic acids may be present in the submitted sample  additional confirmatory testing may be necessary for epidemiological  and / or clinical management purposes  to differentiate between  SARS-CoV-2 and other  Sarbecovirus currently known to infect humans.  If clinically indicated additional testing with an alternate test  methodology [phone]) is advised. The SARS-CoV-2 RNA is generally  detectable in upper and lower respiratory sp ecimens during the acute  phase of infection. The expected result is Negative. Fact Sheet for Patients:  StrictlyIdeas.no Fact Sheet for Healthcare Providers: BankingDealers.co.za This test is not yet approved or cleared by the [place] FDA and has been authorized for detection and/or diagnosis of SARS-CoV-2 by FDA under an Emergency Use Authorization (EUA).  This EUA will remain in effect (meaning this test can be used) for the duration of the COVID-19 declaration under Section 564(b)(1) of the Act, 21 U.S.C. section 360bbb-3(b)(1), unless the authorization is terminated or revoked sooner. Performed at [facility], [address] [place], [place] [address]   Culture, blood (Routine X 2) w Reflex to ID Panel     Status: None (Preliminary result)   Collection Time: 11/02/18 12:20 PM   Specimen: BLOOD  Result Value Ref Range Status   Specimen Description BLOOD ART LINE  Final   Special Requests   Final    BOTTLES DRAWN AEROBIC AND ANAEROBIC Blood Culture adequate volume   Culture   Final    NO GROWTH < 24 HOURS Performed at [facility], [address] [place], [place] [address]    Report Status PENDING  Incomplete  Culture, blood (Routine X 2) w Reflex to ID Panel     Status: None (Preliminary result)   Collection Time: 11/02/18 12:20 PM   Specimen: BLOOD  Result Value Ref Range Status   Specimen Description BLOOD VENOUS LINE  Final   Special Requests   Final    BOTTLES DRAWN AEROBIC AND ANAEROBIC Blood Culture adequate volume   Culture   Final    NO GROWTH < 24 HOURS Performed at [facility], [address], [place], [place] [address]    Report Status PENDING  Incomplete     Labs: Basic Metabolic  Panel: Recent Labs  Lab 11/02/18 0701 11/03/18 0709  NA 137 136  K 7.8* 4.6  CL 98 95*  CO2 22 29  GLUCOSE 75 104*  BUN 82* 35*  CREATININE 10.43* 5.53*  CALCIUM 7.7* 8.3*  PHOS  --  5.8*   Liver Function Tests: Recent Labs  Lab 11/02/18 0701 11/03/18 0709  AST 23  --   ALT 19  --   ALKPHOS 112  --   BILITOT 1.0  --   PROT 7.9  --   ALBUMIN 3.9 3.5   No results for input(s): LIPASE, AMYLASE in the last 168 hours. No results for input(s): AMMONIA in the last 168 hours. CBC: Recent Labs  Lab 11/02/18 0701 11/03/18 0709  WBC 13.4* 6.9  NEUTROABS 11.0*  --   HGB 9.3* 8.9*  HCT 29.6* 27.6*  MCV 108.8* 105.7*  PLT 274 270   Cardiac Enzymes: No results for input(s): CKTOTAL, CKMB, CKMBINDEX, TROPONINI in the last 168 hours. BNP: Invalid input(s): POCBNP CBG: Recent Labs  Lab 11/03/18 0553 11/03/18 0700  GLUCAP 62* 91    Time coordinating discharge:  36 minutes  Signed:  Orson Eva, DO [facility] Pager: [phone] 11/03/2018, 2:07 PM

## 2018-11-03 NOTE — Progress Notes (Signed)
Notified by [employer] that pt's QT interval is >508, which is new. Pt with no c/o chest pain or other issue. Additionally, during my assessment, I am unable to palpate a thrill or auscultate a bruit in patient's AV fistula, which was last used yesterday. Dr. Carles Collet notified, awaiting further orders.

## 2018-11-03 NOTE — Progress Notes (Signed)
[facility] Progress Note   48 year old male presenting with upper and lower respiratory symptoms, concern for COVID; and recurrent severe life-threatening hyperkalemia; ESRD; driven by nonadherence. Assessment/ Plan:   1. Hyperkalemia/ ESRD nonadherent, [facility].  Does not attend most Wednesdays, left upper arm AV fistula with angiogram and PTA of 50% stenosis 9/1 + difficulty with cannulation yesterday but able to complete full treatment. Today he is thrombosed. - he will be d/c today and he can receive an outpatient thrombectomy Monday. D/w charge nurse at [facility]. 2. Symptoms concerning for COVID-19 - negative 3. PVD history of right BKA 4. DM 2   Subjective:   Feels better; denies f/c/n/ v/dyspnea/CP   Objective:   BP 127/79 (BP Location: Right Arm)   Pulse 82   Temp 98.2 F (36.8 C) (Oral)   Resp 16   Ht 5\' 9"  (1.753 m)   Wt 86.9 kg   SpO2 100%   BMI 28.29 kg/m   Intake/Output Summary (Last 24 hours) at 11/03/2018 1331 Last data filed at 11/03/2018 0900 Gross per 24 hour  Intake 339.79 ml  Output 2900 ml  Net -2560.21 ml   Weight change: 0 kg  Physical Exam: GEN: NAD, A&Ox3, NCAT HEENT: No conjunctival pallor, EOMI NECK: Supple, no thyromegaly LUNGS: CTA B/L no rales, rhonchi or wheezing CV: RRR, No M/R/G ABD: SNDNT +BS  EXT: BKA ACCESS: LUA AVG no bruit    Imaging: Ct Abdomen Pelvis Wo Contrast  Result Date: 11/02/2018 CLINICAL DATA:  Abdominal pain.  Dialysis patient. EXAM: CT ABDOMEN AND PELVIS WITHOUT CONTRAST TECHNIQUE: Multidetector CT imaging of the abdomen and pelvis was performed following the standard protocol without IV contrast. COMPARISON:  None. FINDINGS: Lower chest: Small to moderate-sized right pleural effusion is noted with some overlying atelectasis. The heart is borderline enlarged. No pericardial effusion. Age advanced coronary artery calcifications are noted. Hepatobiliary: No focal hepatic lesions are identified  without contrast. The gallbladder is surgically absent. No common bile duct dilatation. Pancreas: No mass, inflammation or ductal dilatation. Spleen: Normal size.  No focal lesions. Adrenals/Urinary Tract: The adrenal glands are unremarkable. Both kidneys are small and demonstrate extensive renal artery calcifications. No worrisome renal lesions or hydronephrosis. The bladder is unremarkable. Stomach/Bowel: The stomach, duodenum, small bowel and colon are grossly normal. No acute inflammatory changes, mass lesions or obstructive findings. The terminal ileum is normal. The appendix is normal. Vascular/Lymphatic: Advanced vascular calcifications. No aneurysm. Scattered small mesenteric and retroperitoneal lymph nodes but no mass or overt adenopathy. No pelvic adenopathy. Reproductive: The prostate gland and seminal vesicles are unremarkable. Other: No pelvic mass or adenopathy. No free pelvic fluid collections. No inguinal mass or adenopathy. No abdominal wall hernia or subcutaneous lesions. Musculoskeletal: No significant bony findings. IMPRESSION: 1. Small to moderate-sized right pleural effusion with minimal overlying atelectasis. 2. No acute abdominal/pelvic findings, mass lesions or adenopathy. 3. Status post cholecystectomy.  No biliary dilatation. 4. Advanced vascular calcifications and small kidneys. Electronically Signed   By: Marijo Sanes M.D.   On: 11/02/2018 20:09   Nm Pulmonary Perfusion  Result Date: 11/02/2018 CLINICAL DATA:  Positive D-dimer, shortness of breath EXAM: NUCLEAR MEDICINE PERFUSION LUNG SCAN TECHNIQUE: Perfusion images were obtained in multiple projections after intravenous injection of radiopharmaceutical. Ventilation scans intentionally deferred if perfusion scan and chest x-ray adequate for interpretation during COVID 19 epidemic. RADIOPHARMACEUTICALS:  1.52 mCi Tc-65m MAA IV COMPARISON:  Chest x-ray earlier today FINDINGS: There are no perfusion defects noted. IMPRESSION: Normal  perfusion study.  No evidence of pulmonary embolus. Electronically Signed   By: Rolm Baptise M.D.   On: 11/02/2018 19:45   Dg Chest Port 1 View  Result Date: 11/02/2018 CLINICAL DATA:  Shortness of breath and body aches for 3-4 days. The patient missed dialysis this week. EXAM: PORTABLE CHEST 1 VIEW COMPARISON:  Single-view of the chest 10/05/2018. FINDINGS: There is cardiomegaly and pulmonary edema. No consolidative process, pneumothorax or pleural effusion. IMPRESSION: Cardiomegaly and pulmonary edema. Electronically Signed   By: Inge Rise M.D.   On: 11/02/2018 07:29    Labs: BMET Recent Labs  Lab 11/02/18 0701 11/03/18 0709  NA 137 136  K 7.8* 4.6  CL 98 95*  CO2 22 29  GLUCOSE 75 104*  BUN 82* 35*  CREATININE 10.43* 5.53*  CALCIUM 7.7* 8.3*  PHOS  --  5.8*   CBC Recent Labs  Lab 11/02/18 0701 11/03/18 0709  WBC 13.4* 6.9  NEUTROABS 11.0*  --   HGB 9.3* 8.9*  HCT 29.6* 27.6*  MCV 108.8* 105.7*  PLT 274 270    Medications:    . aspirin  81 mg Oral Daily  . atorvastatin  10 mg Oral QPM  . Chlorhexidine Gluconate Cloth  6 each Topical Q0600  . heparin  5,000 Units Subcutaneous Q8H  . multivitamin  1 tablet Oral Daily      Otelia Santee, MD 11/03/2018, 1:31 PM

## 2018-11-06 ENCOUNTER — Other Ambulatory Visit (HOSPITAL_COMMUNITY): Payer: Self-pay | Admitting: Nephrology

## 2018-11-06 DIAGNOSIS — N186 End stage renal disease: Secondary | ICD-10-CM

## 2018-11-06 DIAGNOSIS — Z992 Dependence on renal dialysis: Secondary | ICD-10-CM

## 2018-11-07 LAB — CULTURE, BLOOD (ROUTINE X 2)
Culture: NO GROWTH
Culture: NO GROWTH
Special Requests: ADEQUATE
Special Requests: ADEQUATE

## 2018-11-08 ENCOUNTER — Other Ambulatory Visit: Payer: Self-pay | Admitting: Radiology

## 2018-11-08 ENCOUNTER — Other Ambulatory Visit: Payer: Self-pay | Admitting: Student

## 2018-11-09 ENCOUNTER — Other Ambulatory Visit: Payer: Self-pay

## 2018-11-09 ENCOUNTER — Other Ambulatory Visit (HOSPITAL_COMMUNITY): Payer: Self-pay | Admitting: Nephrology

## 2018-11-09 ENCOUNTER — Non-Acute Institutional Stay (HOSPITAL_COMMUNITY)
Admission: EM | Admit: 2018-11-09 | Discharge: 2018-11-10 | Disposition: A | Discharge status: Home / Self Care | Payer: Medicaid Other | Attending: Emergency Medicine | Admitting: Emergency Medicine

## 2018-11-09 ENCOUNTER — Ambulatory Visit (HOSPITAL_COMMUNITY)
Admission: RE | Admit: 2018-11-09 | Discharge: 2018-11-09 | Disposition: A | Discharge status: Home / Self Care | Payer: Medicaid Other | Source: Ambulatory Visit | Attending: Nephrology | Admitting: Nephrology

## 2018-11-09 ENCOUNTER — Encounter (HOSPITAL_COMMUNITY): Payer: Self-pay | Admitting: Emergency Medicine

## 2018-11-09 DIAGNOSIS — E1122 Type 2 diabetes mellitus with diabetic chronic kidney disease: Secondary | ICD-10-CM | POA: Insufficient documentation

## 2018-11-09 DIAGNOSIS — Z86718 Personal history of other venous thrombosis and embolism: Secondary | ICD-10-CM | POA: Diagnosis not present

## 2018-11-09 DIAGNOSIS — T82868A Thrombosis of vascular prosthetic devices, implants and grafts, initial encounter: Secondary | ICD-10-CM | POA: Diagnosis present

## 2018-11-09 DIAGNOSIS — F419 Anxiety disorder, unspecified: Secondary | ICD-10-CM | POA: Insufficient documentation

## 2018-11-09 DIAGNOSIS — Z79899 Other long term (current) drug therapy: Secondary | ICD-10-CM | POA: Diagnosis not present

## 2018-11-09 DIAGNOSIS — Z87891 Personal history of nicotine dependence: Secondary | ICD-10-CM | POA: Insufficient documentation

## 2018-11-09 DIAGNOSIS — Z888 Allergy status to other drugs, medicaments and biological substances status: Secondary | ICD-10-CM | POA: Insufficient documentation

## 2018-11-09 DIAGNOSIS — Y841 Kidney dialysis as the cause of abnormal reaction of the patient, or of later complication, without mention of misadventure at the time of the procedure: Secondary | ICD-10-CM | POA: Diagnosis not present

## 2018-11-09 DIAGNOSIS — E1151 Type 2 diabetes mellitus with diabetic peripheral angiopathy without gangrene: Secondary | ICD-10-CM | POA: Insufficient documentation

## 2018-11-09 DIAGNOSIS — N186 End stage renal disease: Secondary | ICD-10-CM

## 2018-11-09 DIAGNOSIS — R9431 Abnormal electrocardiogram [ECG] [EKG]: Secondary | ICD-10-CM | POA: Insufficient documentation

## 2018-11-09 DIAGNOSIS — E1140 Type 2 diabetes mellitus with diabetic neuropathy, unspecified: Secondary | ICD-10-CM | POA: Diagnosis not present

## 2018-11-09 DIAGNOSIS — Z89511 Acquired absence of right leg below knee: Secondary | ICD-10-CM | POA: Insufficient documentation

## 2018-11-09 DIAGNOSIS — Z992 Dependence on renal dialysis: Secondary | ICD-10-CM

## 2018-11-09 DIAGNOSIS — Z7982 Long term (current) use of aspirin: Secondary | ICD-10-CM | POA: Insufficient documentation

## 2018-11-09 DIAGNOSIS — F4310 Post-traumatic stress disorder, unspecified: Secondary | ICD-10-CM | POA: Insufficient documentation

## 2018-11-09 DIAGNOSIS — I509 Heart failure, unspecified: Secondary | ICD-10-CM | POA: Insufficient documentation

## 2018-11-09 DIAGNOSIS — E875 Hyperkalemia: Secondary | ICD-10-CM | POA: Diagnosis present

## 2018-11-09 DIAGNOSIS — E785 Hyperlipidemia, unspecified: Secondary | ICD-10-CM | POA: Insufficient documentation

## 2018-11-09 DIAGNOSIS — Z794 Long term (current) use of insulin: Secondary | ICD-10-CM | POA: Insufficient documentation

## 2018-11-09 HISTORY — PX: IR US GUIDE VASC ACCESS LEFT: IMG2389

## 2018-11-09 HISTORY — PX: IR THROMBECTOMY AV FISTULA W/THROMBOLYSIS/PTA INC/SHUNT/IMG LEFT: IMG6106

## 2018-11-09 LAB — I-STAT CHEM 8, ED
BUN: 118 mg/dL — ABNORMAL HIGH (ref 6–20)
Calcium, Ion: 0.75 mmol/L — CL (ref 1.15–1.40)
Chloride: 103 mmol/L (ref 98–111)
Creatinine, Ser: 15.3 mg/dL — ABNORMAL HIGH (ref 0.61–1.24)
Glucose, Bld: 65 mg/dL — ABNORMAL LOW (ref 70–99)
HCT: 22 % — ABNORMAL LOW (ref 39.0–52.0)
Hemoglobin: 7.5 g/dL — ABNORMAL LOW (ref 13.0–17.0)
Potassium: 6.7 mmol/L (ref 3.5–5.1)
Sodium: 138 mmol/L (ref 135–145)
TCO2: 21 mmol/L — ABNORMAL LOW (ref 22–32)

## 2018-11-09 LAB — BASIC METABOLIC PANEL
Anion gap: 21 — ABNORMAL HIGH (ref 5–15)
BUN: 105 mg/dL — ABNORMAL HIGH (ref 6–20)
CO2: 18 mmol/L — ABNORMAL LOW (ref 22–32)
Calcium: 6.5 mg/dL — ABNORMAL LOW (ref 8.9–10.3)
Chloride: 101 mmol/L (ref 98–111)
Creatinine, Ser: 14.54 mg/dL — ABNORMAL HIGH (ref 0.61–1.24)
GFR calc Af Amer: 4 mL/min — ABNORMAL LOW (ref >60)
GFR calc non Af Amer: 4 mL/min — ABNORMAL LOW (ref >60)
Glucose, Bld: 76 mg/dL (ref 70–99)
Potassium: 6.6 mmol/L (ref 3.5–5.1)
Sodium: 140 mmol/L (ref 135–145)

## 2018-11-09 LAB — CBC
HCT: 24.5 % — ABNORMAL LOW (ref 39.0–52.0)
Hemoglobin: 7.9 g/dL — ABNORMAL LOW (ref 13.0–17.0)
MCH: 34.3 pg — ABNORMAL HIGH (ref 26.0–34.0)
MCHC: 32.2 g/dL (ref 30.0–36.0)
MCV: 106.5 fL — ABNORMAL HIGH (ref 80.0–100.0)
Platelets: 253 10*3/uL (ref 150–400)
RBC: 2.3 MIL/uL — ABNORMAL LOW (ref 4.22–5.81)
RDW: 12.8 % (ref 11.5–15.5)
WBC: 8.2 10*3/uL (ref 4.0–10.5)
nRBC: 0 % (ref 0.0–0.2)

## 2018-11-09 LAB — GLUCOSE, CAPILLARY
Glucose-Capillary: 71 mg/dL (ref 70–99)
Glucose-Capillary: 75 mg/dL (ref 70–99)

## 2018-11-09 LAB — PROTIME-INR
INR: 1.2 (ref 0.8–1.2)
Prothrombin Time: 15.5 seconds — ABNORMAL HIGH (ref 11.4–15.2)

## 2018-11-09 IMAGING — US IR THROMBECTOMY AV FISTULA W/THROMBOLYSIS/PTA INC/SHUNT/IMG*L*
11 of 18 series · 13 of 24 positions shown · non-contrast
Comparison: none

INDICATION: 48-year-old male with left upper extremity graft/dialysis circuit
thrombosed.

[Series 1: ir thrombectomy av fistula w/thrombolysis/pta inc/ · 1 of 2 slices shown]
[im 1/2]
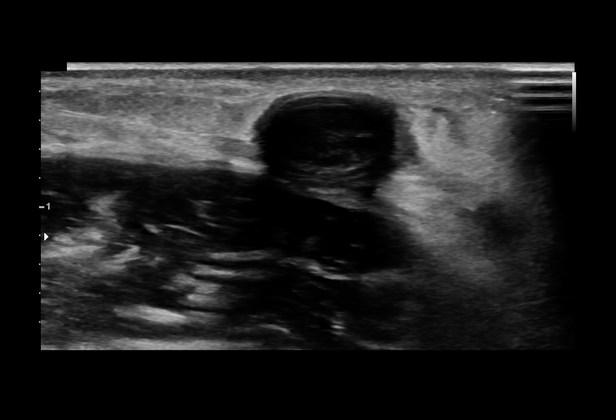

[Series 2: body 4 care · 1 of 7 slices shown (1 of 5)]
[im 1/7]
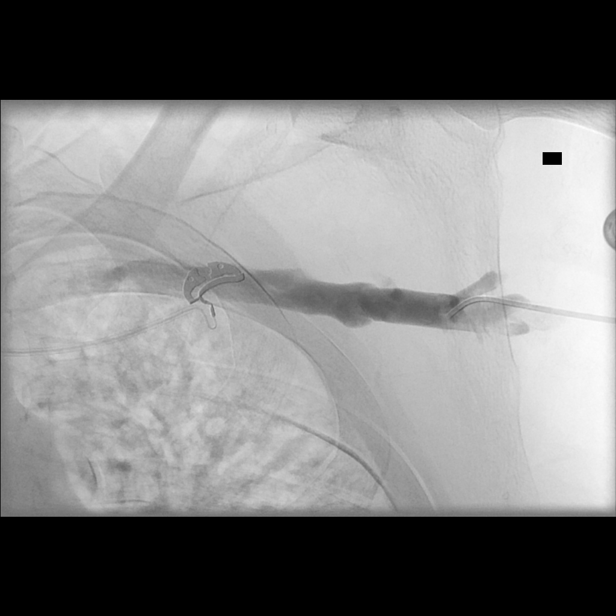

[Series 4: fl (-) angio · 1 of 1 slices shown (1 of 5)]
[im 1/1]
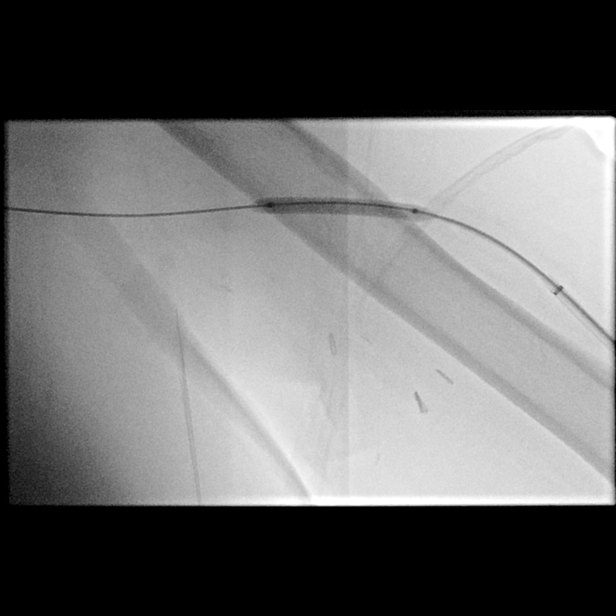

[Series 6: fl (-) angio · 1 of 1 slices shown (2 of 5)]
[im 1/1]
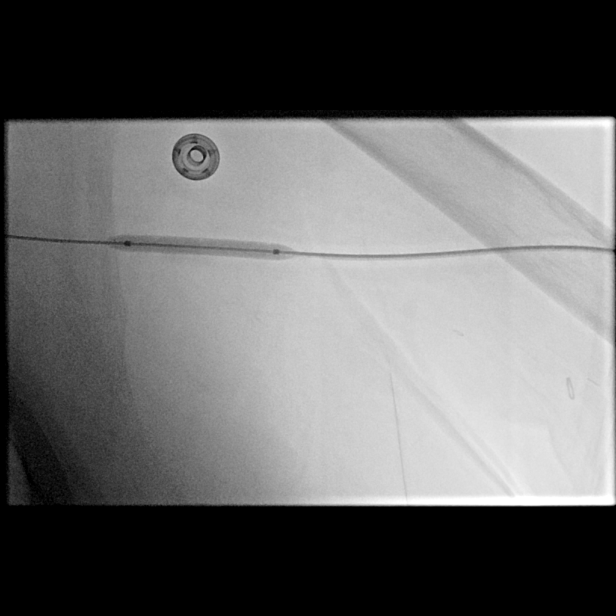

[Series 10: fl (-) angio · 2 of 22 slices shown (3 of 5)]
[im 1/22]
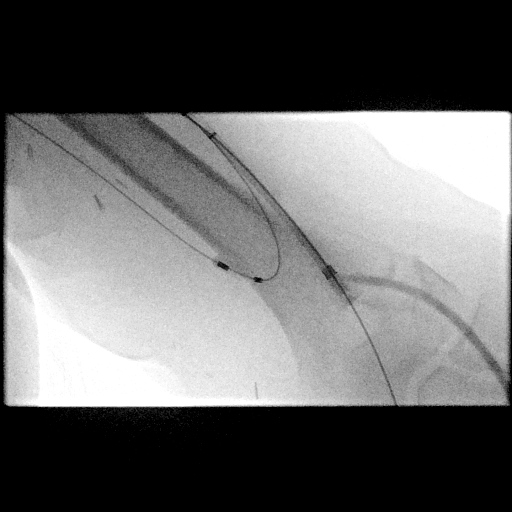
[im 22/22]
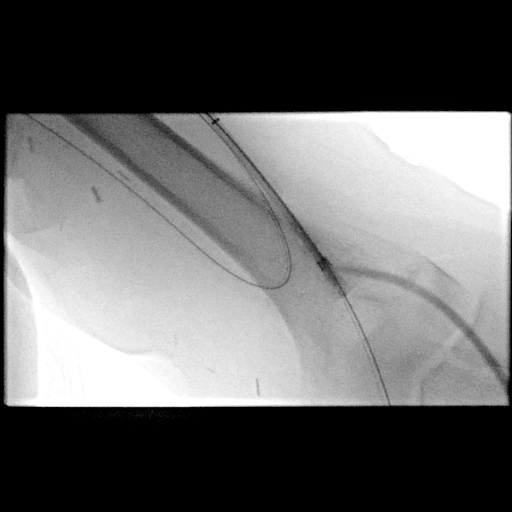

[Series 11: body 4 care · 1 of 16 slices shown (2 of 5)]
[im 16/16]
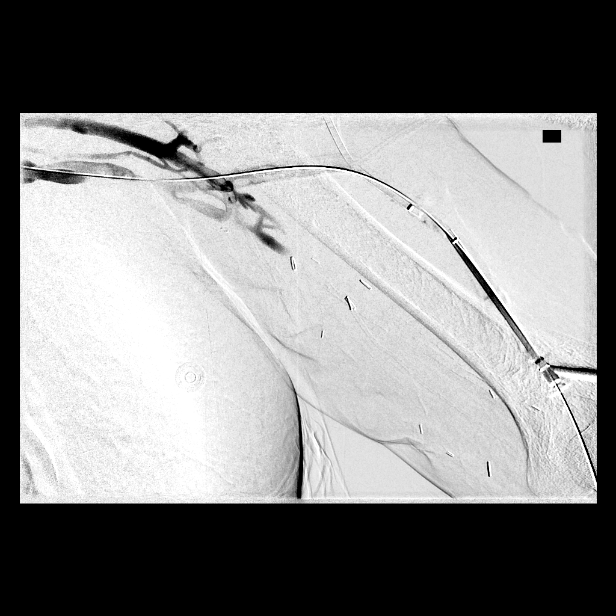

[Series 12: body 4 care · 2 of 16 slices shown (3 of 5)]
[im 1/16]
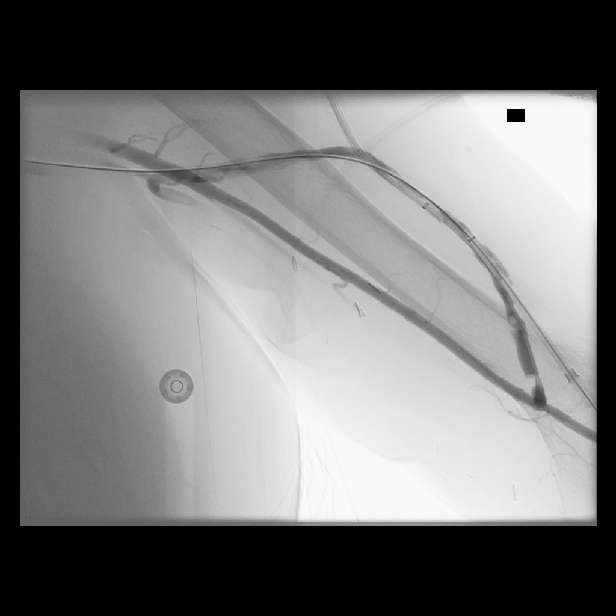
[im 16/16]
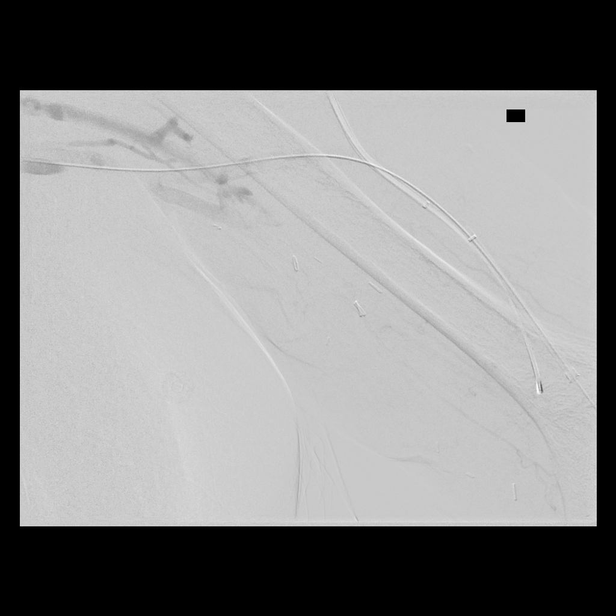

[Series 14: fl (-) angio · 1 of 1 slices shown (4 of 5)]
[im 1/1]
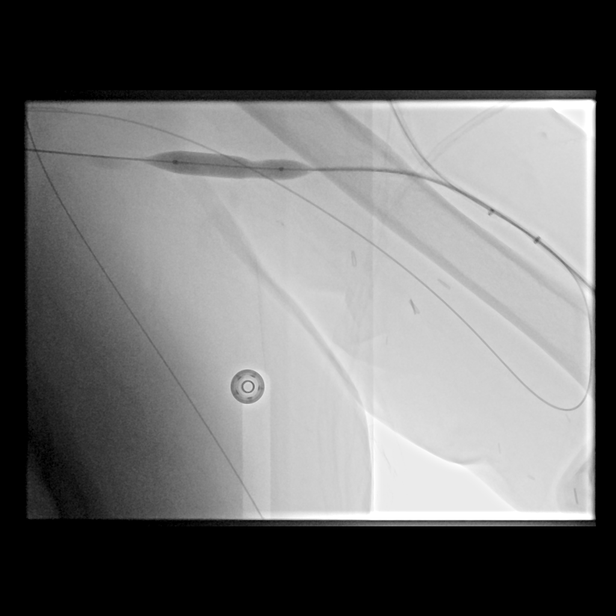

[Series 16: fl (-) angio · 1 of 1 slices shown (5 of 5)]
[im 1/1]
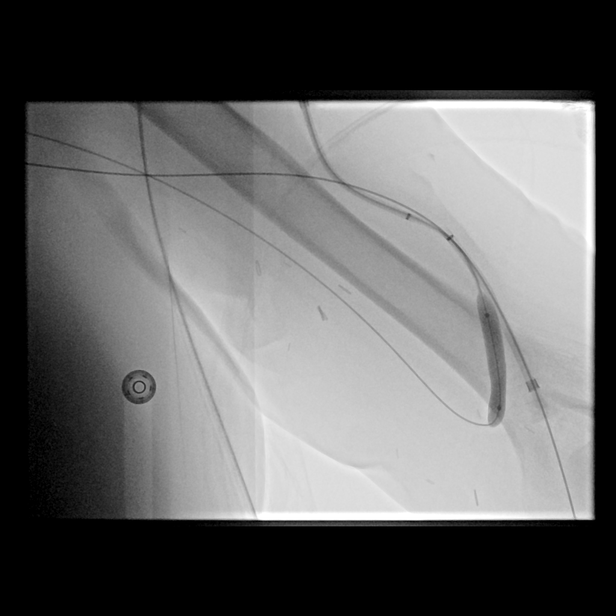

[Series 18: body 4 care · 1 of 9 slices shown (4 of 5)]
[im 1/9]
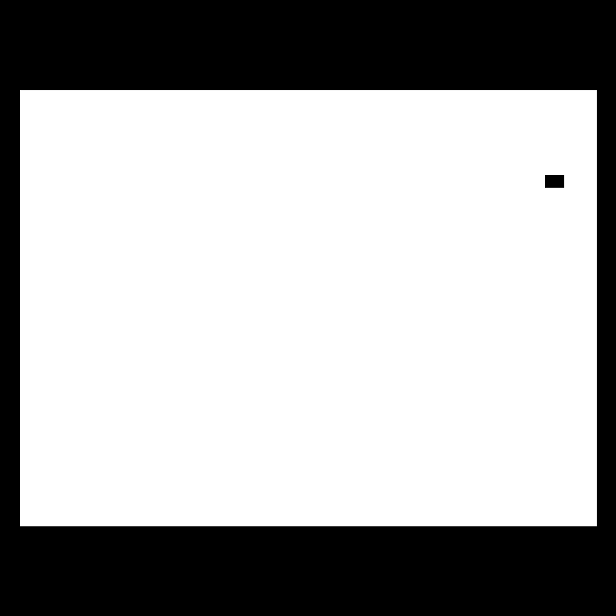

[Series 19: body 4 care · 1 of 12 slices shown (5 of 5)]
[im 12/12]
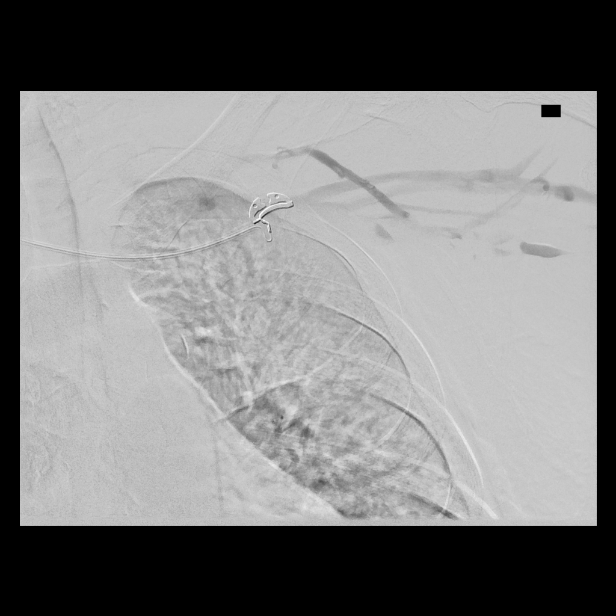

[13 of 24 positions shown; findings below may reference images not displayed]

He has been referred for attempt at restoration of flow

EXAM:
ULTRASOUND-GUIDED ACCESS OF LEFT UPPER EXTREMITY GRAPH X2

FISTULAGRAM

MECHANICAL AND PHARMACOLOGIC THROMBECTOMY OF THROMBOSED DIALYSIS
CIRCUIT

ANGIOPLASTY FOR BALLOON MACERATION

ANGIOPLASTY FOR TREATMENT OF ARTERIAL INFLOW STENOSIS ESTIMATED
30%-40% WITH NO RESIDUAL ON COMPLETION

MEDICATIONS:
None.

ANESTHESIA/SEDATION:
Moderate Sedation Time:  39 minutes

The patient was continuously monitored during the procedure by the
interventional radiology nurse under my direct supervision.

FLUOROSCOPY TIME:  Fluoroscopy Time: 5 minutes 54 seconds (30 mGy).

COMPLICATIONS:
None

PROCEDURE:
The procedure, risks, benefits, and alternatives were explained to
the patient and the patient's family, including bleeding, infection,
arterial thrombus, venous thrombus, vessel injury, need for further
procedure, need for stenting, contrast reaction, need for catheter
placement, cardiopulmonary collapse, death. Questions regarding the
procedure were encouraged and answered. The patient understands and
consents to the procedure.

Ultrasound survey was performed with images stored and sent to PACs.

The left upper extremity was prepped and draped in the usual sterile
fashion.

Ultrasound guidance was used to access the upper extremity graft
with a micropuncture kit. Exchange was made for a 7 French short
sheath.

A Bentson wire was then a navigate into the venous outflow, with an
angled catheter advanced into the venous outflow.

Venogram of the left upper extremity demonstrates patency of the
central venous outflow

Upon withdrawal of the angled catheter, a solution of 3 milligrams
of tPA in saline was infused into the outflow portion of the graft.

A 5 mm balloon catheter was used to macerate the thrombosed segment,
directed centrally.

Balloon angioplasty of the stenotic venous segment was performed
with 5 mm, 7 mm, and 9 mm balloon angioplasty. No waist was
observed.

Ultrasound guidance was then used access the fistula directed toward
the arterial anastomosis. Once the micro sheath was in place, an
exchange was made for a 6 French short sheath.

Glidewire was navigated across the arterial anastomosis, with an
over the wire Fogarty balloon advanced into the artery. The arterial
plug was pulled, and then a angiogram demonstrated a stenosis at the
arterial anastomosis, estimated 30%-40%. A 7 millimeter x 4
centimeter balloon was used to balloon angioplasty the arterial
anastomosis.

Repeat fistulogram was then performed.

Catheters and wires were removed with a complete fistulagram
performed through the central vasculature.

No stenosis was identified in the venous outflow. No residual
stenosis at the arterial anastomosis.

Excellent thrill and flow at the completion.

The short sheaths were removed after placement of hemostasis
sutures.

Patient tolerated the procedure well and remained hemodynamically
stable throughout.

No complications were encountered and no significant blood loss was
encountered.
IMPRESSION: Status post ultrasound-guided access of left upper extremity
dialysis graft, with mechanical and pharmacologic thrombectomy, with
restoration of flow and palpable thrill, and treatment of of
arterial anastomosis narrowing with no residual stenosis.

ACCESS:
This access remains amenable to future percutaneous interventions as
clinically indicated.

## 2018-11-09 MED ORDER — SODIUM CHLORIDE 0.9 % IV SOLN: INTRAVENOUS | Status: DC

## 2018-11-09 MED ORDER — IOHEXOL 300 MG/ML  SOLN
100.0000 mL | Freq: Once | INTRAMUSCULAR | Status: AC | PRN
  Administered 2018-11-09: 50 mL via INTRAVENOUS

## 2018-11-09 MED ORDER — HEPARIN SODIUM (PORCINE) 1000 UNIT/ML IJ SOLN
INTRAMUSCULAR | Status: AC | PRN
  Administered 2018-11-09: 2000 [IU] via INTRAVENOUS

## 2018-11-09 MED ORDER — MIDAZOLAM HCL 2 MG/2ML IJ SOLN
INTRAMUSCULAR | Status: AC | PRN
  Administered 2018-11-09: 0.5 mg via INTRAVENOUS
  Administered 2018-11-09: 1 mg via INTRAVENOUS

## 2018-11-09 MED ORDER — ALTEPLASE 2 MG IJ SOLR
INTRAMUSCULAR | Status: AC | PRN
  Administered 2018-11-09: 3 mg
  Administered 2018-11-09: 1 mg

## 2018-11-09 MED ORDER — ALTEPLASE 2 MG IJ SOLR
INTRAMUSCULAR | Status: AC
  Filled 2018-11-09: qty 4

## 2018-11-09 MED ORDER — SODIUM CHLORIDE 0.9 % IV SOLN: 100.0000 mL | INTRAVENOUS | Status: DC | PRN

## 2018-11-09 MED ORDER — HEPARIN SODIUM (PORCINE) 1000 UNIT/ML IJ SOLN
INTRAMUSCULAR | Status: AC
  Filled 2018-11-09: qty 1

## 2018-11-09 MED ORDER — FENTANYL CITRATE (PF) 100 MCG/2ML IJ SOLN
INTRAMUSCULAR | Status: AC | PRN
  Administered 2018-11-09 (×2): 25 ug via INTRAVENOUS
  Administered 2018-11-09: 50 ug via INTRAVENOUS

## 2018-11-09 MED ORDER — PENTAFLUOROPROP-TETRAFLUOROETH EX AERO
1.0000 "application " | INHALATION_SPRAY | CUTANEOUS | Status: DC | PRN
  Filled 2018-11-09: qty 116

## 2018-11-09 MED ORDER — CEFAZOLIN SODIUM-DEXTROSE 2-4 GM/100ML-% IV SOLN
INTRAVENOUS | Status: AC
  Filled 2018-11-09: qty 100

## 2018-11-09 MED ORDER — FENTANYL CITRATE (PF) 100 MCG/2ML IJ SOLN
INTRAMUSCULAR | Status: AC
  Filled 2018-11-09: qty 4

## 2018-11-09 MED ORDER — LIDOCAINE HCL 1 % IJ SOLN
INTRAMUSCULAR | Status: AC
  Filled 2018-11-09: qty 20

## 2018-11-09 MED ORDER — LIDOCAINE HCL 1 % IJ SOLN
INTRAMUSCULAR | Status: AC | PRN
  Administered 2018-11-09: 15 mL

## 2018-11-09 MED ORDER — LIDOCAINE HCL (PF) 1 % IJ SOLN: 5.0000 mL | INTRAMUSCULAR | Status: DC | PRN

## 2018-11-09 MED ORDER — LIDOCAINE-PRILOCAINE 2.5-2.5 % EX CREA
1.0000 "application " | TOPICAL_CREAM | CUTANEOUS | Status: DC | PRN
  Filled 2018-11-09: qty 5

## 2018-11-09 MED ORDER — HEPARIN SODIUM (PORCINE) 1000 UNIT/ML DIALYSIS
40.0000 [IU]/kg | INTRAMUSCULAR | Status: DC | PRN
  Filled 2018-11-09: qty 4

## 2018-11-09 MED ORDER — ALTEPLASE 2 MG IJ SOLR: 2.0000 mg | Freq: Once | INTRAMUSCULAR | Status: DC | PRN

## 2018-11-09 MED ORDER — CHLORHEXIDINE GLUCONATE CLOTH 2 % EX PADS: 6.0000 | MEDICATED_PAD | Freq: Every day | CUTANEOUS | Status: DC

## 2018-11-09 MED ORDER — HEPARIN SODIUM (PORCINE) 1000 UNIT/ML DIALYSIS
1000.0000 [IU] | INTRAMUSCULAR | Status: DC | PRN
  Filled 2018-11-09: qty 1

## 2018-11-09 MED ORDER — MIDAZOLAM HCL 2 MG/2ML IJ SOLN
INTRAMUSCULAR | Status: AC
  Filled 2018-11-09: qty 4

## 2018-11-09 NOTE — Care Management (Signed)
Patient will have HD here today after patient will need a ride home. CM reviewed patient's record, will provide taxi voucher after HD.

## 2018-11-09 NOTE — ED Notes (Signed)
Gave report to Dialysis

## 2018-11-09 NOTE — Progress Notes (Signed)
Renal Navigator met with patient in ED room 20 to see if he has any way of getting to OP HD appointment tomorrow and explained that he may not be able to receive inpt HD until tomorrow also, so if he can get a ride to OP HD, he can go home now from the hospital. He informed Renal Navigator that he has absolutely no one to take him to OP HD clinic tomorrow and that he cannot afford a cab/lyft. Renal Navigator contacted ED CSW/[place] to inquire if the Transitions of Care has any options for transportation tomorrow to avoid hospital admission. CSW contacted her supervisor and stated she would call Renal Navigator back if there are any options for patient.  Alphonzo Cruise, [place] Renal Navigator  [phone]

## 2018-11-09 NOTE — ED Notes (Signed)
Notified provider of critical I-stat result.

## 2018-11-09 NOTE — ED Provider Notes (Signed)
I saw and evaluated the patient, reviewed the resident's note and I agree with the findings and plan.  EKG: EKG Interpretation  Date/Time:  Friday November 09 2018 15:57:38 EDT Ventricular Rate:  83 PR Interval:    QRS Duration: 148 QT Interval:  456 QTC Calculation: 536 R Axis:   84 Text Interpretation:  Sinus rhythm Prolonged PR interval Nonspecific intraventricular conduction delay Abnormal lateral Q waves Probable anteroseptal infarct, recent Confirmed by Lacretia Leigh ([id number]) on 11/09/2018 4:33:38 PM   48 year old male presents here requesting dialysis.  Was last dialyzed about a week ago.  Patient had his shunt declotted.  Potassium here 27.  Will consult nephrology for dialysis   Lacretia Leigh, MD 11/09/18 1627

## 2018-11-09 NOTE — Progress Notes (Signed)
Renal Navigator received call from Nephrologist/Dr. Posey Pronto that patient is in IR today having a de-clot and needs to be scheduled for OP HD tomorrow at his clinic/[facility].  Renal Navigator contacted Joy/[facility] who states he can be scheduled for OP HD tomorrow, Saturday 11/10/18 at 11:00am, but she reports that "he does not have his own transportation no matter what he says." She states that he rides [facility] to and from OP HD. She called [facility] and called Renal Navigator back and said that patient could not be added to the [facility] schedule tomorrow. Since this means that patient will need to be admitted for HD, Renal Navigator called [facility] directly and explained that by not being able accommodate patient tomorrow, we are not able to avoid a hospital admission. Renal Navigator was told by [facility] dispatcher that all of the drivers have already been given their schedules for tomorrow and have gone home for today. He confirmed that there is no way to add patient to the [facility] transportation for tomorrow. Renal Navigator informed Nephrologist/Dr. Posey Pronto. Renal Navigator spoke with Casey/IR and asked that patient be asked if there is anyone who can provide him with transportation tomorrow to OP HD and if we feel he has a valid plan, he needs to go to OP HD tomorrow and discharged after de-clot today. It does not sound like this will be an option per conversation with OP HD clinic staff, who know patient well.  Alphonzo Cruise, [place] [facility]al Navigator [phone]

## 2018-11-09 NOTE — H&P (Signed)
Chief Complaint: Patient was seen in consultation today for renal failure, clotted AVG  Referring Physician(s): Fran Lowes  Supervising Physician: Corrie Mckusick  Patient Status: [facility] - Out-pt  History of Present Illness: Brendan Campbell is a 48 y.o. male with past medical history of CHF, DM, PVD, PTSD, and end stage renal disease on HD via left upper arm AVG who presents to Radiology today with clotted access.  Patient reports he hasn't been able to undergo dialysis since last Friday. He is feeling poorly today.  His access today is without bruit with a faint thrill at the distal portion.  He has been NPO.  He does not take blood thinners.   Past Medical History:  Diagnosis Date   Anemia    Anxiety    Blood transfusion without reported diagnosis    CHF (congestive heart failure) ([place])    Chronic kidney disease    Depression    Diabetes mellitus without complication ([place])    End stage renal disease ([place])    M/W/F [facility]   Hyperlipidemia    Neuropathy    Peripheral vascular disease (HCC)    PTSD (post-traumatic stress disorder)    Wears glasses    reading    Past Surgical History:  Procedure Laterality Date   A/V FISTULAGRAM N/A 10/09/2018   Procedure: A/V FISTULAGRAM;  Surgeon: Serafina Mitchell, MD;  Location: [place] [facility];  Service: Cardiovascular;  Laterality: N/A;   AV FISTULA PLACEMENT Left 09/22/2016   Procedure: CREATION OF LEFT ARM ARTERIOVENOUS (AV) FISTULA;  Surgeon: Waynetta Sandy, MD;  Location: [place];  Service: Vascular;  Laterality: Left;   AV FISTULA PLACEMENT Left 10/31/2017   Procedure: INSERTION OF ARTERIOVENOUS (AV) GORE-TEX 4-49m STETCH GRAFT LEFT ARM;  Surgeon: CWaynetta Sandy MD;  [place]  Service: Vascular;  Laterality: Left;   [place] 08/17/2017   Procedure: SECOND STAGE BASILIC VEIN TRANSPOSITION LEFT ARM;  Surgeon: CWaynetta Sandy MD;  [place]  Service: Vascular;  Laterality: Left;   BELOW KNEE LEG AMPUTATION Right    CHOLECYSTECTOMY     FOOT SURGERY     IR FLUORO GUIDE CV LINE RIGHT  05/16/2016   IR REMOVAL TUN CV CATH W/O FL  05/16/2016   IR [place] ACCESS RIGHT  05/16/2016   PERIPHERAL VASCULAR BALLOON ANGIOPLASTY  10/09/2018   Procedure: PERIPHERAL VASCULAR BALLOON ANGIOPLASTY;  Surgeon: BSerafina Mitchell MD;  [place] [facility];  Service: Cardiovascular;;    Allergies: Tape  Medications: Prior to Admission medications   Medication Sig Start Date End Date Taking? Authorizing Provider  aspirin 81 MG chewable tablet Chew 81 mg by mouth daily.   Yes [provider]  blood glucose meter kit and supplies KIT Dispense based on patient and insurance preference. Use up to four times daily as directed. (FOR ICD-9 250.00, 250.01). 06/02/16  Yes BTimmothy Euler MD  multivitamin (RENA-VIT) TABS tablet Take 1 tablet by mouth daily. 08/04/18  Yes [provider]  SURE COMFORT PEN NEEDLES 31G X 5 MM MISC USE AS DIRECTED DAILY. 11/07/16  Yes BTimmothy Euler MD  atorvastatin (LIPITOR) 10 MG tablet Take 1 tablet (10 mg total) by mouth every evening. 06/02/16   BTimmothy Euler MD     Family History  Problem Relation Age of Onset   Cancer Mother        lung   Diabetes Mother    Heart attack Father  Diabetes Father    Diabetes Sister     Social History   Socioeconomic History   Marital status: Single    Spouse name: Not on file   Number of children: Not on file   Years of education: Not on file   Highest education level: Not on file  Occupational History   Not on file  Social Needs   Financial resource strain: Not on file   Food insecurity    Worry: Not on file    Inability: Not on file   Transportation needs    Medical: Not on file    Non-medical: Not on file  Tobacco Use   Smoking status: Former Smoker    Quit date: 09/03/2006    Years since quitting: 12.1     Smokeless tobacco: Never Used  Substance and Sexual Activity   Alcohol use: No   Drug use: No   Sexual activity: Not on file  Lifestyle   Physical activity    Days per week: Not on file    Minutes per session: Not on file   Stress: Not on file  Relationships   Social connections    Talks on phone: Not on file    Gets together: Not on file    Attends religious service: Not on file    Active member of club or organization: Not on file    Attends meetings of clubs or organizations: Not on file    Relationship status: Not on file  Other Topics Concern   Not on file  Social History Narrative   Not on file     Review of Systems: A 12 point ROS discussed and pertinent positives are indicated in the HPI above.  All other systems are negative.  Review of Systems  Constitutional: Positive for fatigue. Negative for fever.  Respiratory: Negative for cough and shortness of breath.   Cardiovascular: Negative for chest pain.  Gastrointestinal: Negative for abdominal pain, nausea and vomiting.  Musculoskeletal: Negative for back pain.  Psychiatric/Behavioral: Negative for behavioral problems and confusion.    Vital Signs: BP (!) 138/95    Pulse 90    Temp 97.7 F (36.5 C) (Oral)    Resp 16    Ht 5' 9"  (1.753 m)    Wt 200 lb (90.7 kg)    BMI 29.53 kg/m   Physical Exam Vitals signs and nursing note reviewed.  Constitutional:      Appearance: Normal appearance.  Cardiovascular:     Rate and Rhythm: Normal rate and regular rhythm.  Pulmonary:     Effort: Pulmonary effort is normal. No respiratory distress.     Breath sounds: Normal breath sounds.  Abdominal:     General: Abdomen is flat.     Palpations: Abdomen is soft.  Musculoskeletal:     Comments: Left upper arm graft without bruit, faint thrill at the distal portion.  Skin:    General: Skin is warm and dry.  Neurological:     General: No focal deficit present.     Mental Status: He is alert and oriented to  person, place, and time.  Psychiatric:        Mood and Affect: Mood normal.        Behavior: Behavior normal.        Thought Content: Thought content normal.        Judgment: Judgment normal.      MD Evaluation Airway: WNL Heart: WNL Abdomen: WNL Chest/ Lungs: WNL ASA  Classification: 3 Mallampati/Airway  Score: Two   Imaging: Ct Abdomen Pelvis Wo Contrast  Result Date: 11/02/2018 CLINICAL DATA:  Abdominal pain.  Dialysis patient. EXAM: CT ABDOMEN AND PELVIS WITHOUT CONTRAST TECHNIQUE: Multidetector CT imaging of the abdomen and pelvis was performed following the standard protocol without IV contrast. COMPARISON:  None. FINDINGS: Lower chest: Small to moderate-sized right pleural effusion is noted with some overlying atelectasis. The heart is borderline enlarged. No pericardial effusion. Age advanced coronary artery calcifications are noted. Hepatobiliary: No focal hepatic lesions are identified without contrast. The gallbladder is surgically absent. No common bile duct dilatation. Pancreas: No mass, inflammation or ductal dilatation. Spleen: Normal size.  No focal lesions. Adrenals/Urinary Tract: The adrenal glands are unremarkable. Both kidneys are small and demonstrate extensive renal artery calcifications. No worrisome renal lesions or hydronephrosis. The bladder is unremarkable. Stomach/Bowel: The stomach, duodenum, small bowel and colon are grossly normal. No acute inflammatory changes, mass lesions or obstructive findings. The terminal ileum is normal. The appendix is normal. Vascular/Lymphatic: Advanced vascular calcifications. No aneurysm. Scattered small mesenteric and retroperitoneal lymph nodes but no mass or overt adenopathy. No pelvic adenopathy. Reproductive: The prostate gland and seminal vesicles are unremarkable. Other: No pelvic mass or adenopathy. No free pelvic fluid collections. No inguinal mass or adenopathy. No abdominal wall hernia or subcutaneous lesions.  Musculoskeletal: No significant bony findings. IMPRESSION: 1. Small to moderate-sized right pleural effusion with minimal overlying atelectasis. 2. No acute abdominal/pelvic findings, mass lesions or adenopathy. 3. Status post cholecystectomy.  No biliary dilatation. 4. Advanced vascular calcifications and small kidneys. Electronically Signed   By: Marijo Sanes M.D.   On: 11/02/2018 20:09   Nm Pulmonary Perfusion  Result Date: 11/02/2018 CLINICAL DATA:  Positive D-dimer, shortness of breath EXAM: NUCLEAR MEDICINE PERFUSION LUNG SCAN TECHNIQUE: Perfusion images were obtained in multiple projections after intravenous injection of radiopharmaceutical. Ventilation scans intentionally deferred if perfusion scan and chest x-ray adequate for interpretation during COVID 19 epidemic. RADIOPHARMACEUTICALS:  1.52 mCi Tc-57mMAA IV COMPARISON:  Chest x-ray earlier today FINDINGS: There are no perfusion defects noted. IMPRESSION: Normal perfusion study.  No evidence of pulmonary embolus. Electronically Signed   By: KRolm BaptiseM.D.   On: 11/02/2018 19:45   Dg Chest Port 1 View  Result Date: 11/02/2018 CLINICAL DATA:  Shortness of breath and body aches for 3-4 days. The patient missed dialysis this week. EXAM: PORTABLE CHEST 1 VIEW COMPARISON:  Single-view of the chest 10/05/2018. FINDINGS: There is cardiomegaly and pulmonary edema. No consolidative process, pneumothorax or pleural effusion. IMPRESSION: Cardiomegaly and pulmonary edema. Electronically Signed   By: TInge RiseM.D.   On: 11/02/2018 07:29    Labs:  CBC: Recent Labs    10/09/18 0210 11/02/18 0701 11/03/18 0709 11/09/18 1057  WBC 6.1 13.4* 6.9 8.2  HGB 11.0* 9.3* 8.9* 7.9*  HCT 33.9* 29.6* 27.6* 24.5*  PLT 250 274 270 253    COAGS: Recent Labs    11/09/18 1057  INR 1.2    BMP: Recent Labs    10/09/18 0210 11/02/18 0701 11/03/18 0709 11/09/18 1057  NA 136 137 136 140  K 4.3 7.8* 4.6 6.6*  CL 95* 98 95* 101  CO2 28 22  29  18*  GLUCOSE 152* 75 104* 76  BUN 14 82* 35* 105*  CALCIUM 9.1 7.7* 8.3* 6.5*  CREATININE 3.86* 10.43* 5.53* 14.54*  GFRNONAA 17* 5* 11* 4*  GFRAA 20* 6* 13* 4*    LIVER FUNCTION TESTS: Recent Labs    10/05/18 1603 10/05/18 2022  11/02/18 0701 11/03/18 0709  BILITOT 0.9 0.7 1.0  --   AST 33 32 23  --   ALT 38 34 19  --   ALKPHOS 112 103 112  --   PROT 7.8 7.0 7.9  --   ALBUMIN 4.0 3.6 3.9 3.5    TUMOR MARKERS: No results for input(s): AFPTM, CEA, CA199, CHROMGRNA in the last 8760 hours.  Assessment and Plan: Patient with past medical history of renal failure, left AVG presents with complaint of clotted access, inability to get dialysis for 1 week.  IR consulted for declotting procedure at the request of Dr. Lowanda Foster.  Patient presents today feeling poorly.  He has been NPO and is not currently on blood thinners.  His potassium today is elevated at 6.6. Will discuss with MD.  Risks and benefits discussed with the patient including, but not limited to bleeding, infection, vascular injury, pulmonary embolism, need for tunneled HD catheter placement or even death.  All of the patient's questions were answered, patient is agreeable to proceed. Consent signed and in chart.  Thank you for this interesting consult.  I greatly enjoyed meeting Brendan Campbell and look forward to participating in their care.  A copy of this report was sent to the requesting provider on this date.  Electronically Signed: Docia Barrier, PA 11/09/2018, 12:02 PM   I spent a total of    25 Minutes in face to face in clinical consultation, greater than 50% of which was counseling/coordinating care for renal failure, clotted AVG.

## 2018-11-09 NOTE — ED Notes (Signed)
Pt will come back to the ED for Discharge.

## 2018-11-09 NOTE — Discharge Instructions (Addendum)
Dialysis Vascular Access Malfunction        A vascular access is an entrance to your blood vessels that can be used for dialysis. Dialysis is a treatment used for kidney failure. A health care provider can make a vascular access in many ways, such as by:  Joining an artery to a vein under your skin to make a bigger blood vessel called a fistula.  Joining an artery to a vein under your skin using a soft tube called a graft.  Placing a thin, flexible tube (catheter) in a large vein, usually in your neck, chest, or groin. A vascular access can become blocked or stop working correctly (malfunction). What can cause my vascular access to malfunction?  Infection. This is the most common cause of malfunction.  A blood clot inside a part of the fistula, graft, or catheter. A blood clot can completely or partially block the flow of blood.  A kink in the graft or catheter.  A collection of blood (hematoma or bruise) next to the graft or catheter that pushes against it, blocking the flow of blood. What are signs and symptoms of vascular access malfunction? Signs and symptoms of vascular access malfunction include:  A change in the vibration or pulse (thrill) of your fistula or graft.  The thrill of your fistula or graft being gone.  New or unusual swelling of the area around the access.  An unsuccessful puncture of your access by the dialysis team.  The flow of blood through the fistula, graft, or catheter being too slow for effective dialysis.  Bleeding that cannot be easily controlled when routine dialysis is completed and the needle is removed.  Signs of infection such as pain, swelling, redness, red streaks, numbness, and blood or pus coming from or around the access. What happens if my vascular access malfunctions? If your vascular access malfunctions, your health care provider may order blood work, cultures, or an X-ray test to find out what went wrong. The X-ray test involves the  injection of a dye (contrast) into the vascular access. The contrast shows up on the X-ray and lets your health care provider see if there is a blockage in the vascular access. Treatment varies depending on the cause of the malfunction:  If the vascular access is infected, your health care provider may prescribe antibiotic medicine to control the infection.  If a clot is found in the vascular access, you may need surgery to remove the clot. Blood thinning medicines may also be used.  If a blockage in the vascular access is due to some other cause, such as a kink in a graft, you will likely need surgery to unblock or replace the graft.  If there is a malfunction for any reason, your health care provider may remove and replace your access. Follow these instructions at home: Medicines  Take over-the-counter and prescription medicines only as told by your health care provider.  If you were prescribed an antibiotic medicine, use it as told by your health care provider. Do not stop using the antibiotic even if you start to feel better. Activity  Do not lift heavy things with the arm that has the access.  Try not to bump or hurt the arm that has the access. Lifestyle  Do not wear jewelry or tight clothing around the access.  Do not sleep by placing the access arm under your head or body. General instructions  Wash your hands with soap and water before touching the area around the   vascular access. If soap and water are not available, use hand sanitizer. °· Keep all follow-up visits as told by your health care provider. This is very important. Any delay in follow-up could cause permanent dysfunction of the vascular access, which may be dangerous. °· Do not measure your blood pressure on the arm that has the access. °· Keep the access area clean and do not use makeup, creams, lotions, or ointments on it. °· Check your access area every day for signs of infection. Check for: °? More redness,  swelling, or pain. °? More fluid or blood. °? Warmth. °? Pus or a bad smell. °Contact a health care provider if: °· Swelling around the vascular access gets worse. °· You develop new pain. °Get help right away if: °· You have bleeding at the vascular access that cannot be easily controlled. °· You have pain, numbness, an unusual pale skin color, or blue fingers or sores at the tips of your fingers in the hand on the side of your fistula. °· You have chills. °· You have a fever. °· You have pus or other fluid (drainage) at the vascular access site. °· You develop skin redness or red streaking on the skin around, above, or below the vascular access. °· Your access is hot, swollen, red, and very painful. °· You can suddenly see the cuff of your catheter used in the access. °Summary °· A vascular access is an entrance to your blood vessels that can be used for dialysis. °· Several things can cause your vascular access to malfunction. °· Treatment varies depending on the cause of the malfunction. °· Keep all follow-up visits as told by your health care provider. Any delay in follow-up could cause permanent dysfunction of the vascular access, which may be dangerous. °This information is not intended to replace advice given to you by your health care provider. Make sure you discuss any questions you have with your health care provider. °Document Released: 12/27/2005 Document Revised: 04/21/2017 Document Reviewed: 02/19/2016 °Elsevier Patient Education © 2020 [employer] Inc[employer] °Moderate Conscious Sedation, Adult, Care After °These instructions provide you with information about caring for yourself after your procedure. Your health care provider may also give you more specific instructions. Your treatment has been planned according to current medical practices, but problems sometimes occur. Call your health care provider if you have any problems or questions after your procedure. °What can I expect after the procedure? °After  your procedure, it is common: °· To feel sleepy for several hours. °· To feel clumsy and have poor balance for several hours. °· To have poor judgment for several hours. °· To vomit if you eat too soon. °Follow these instructions at home: °For at least 24 hours after the procedure: ° °· Do not: °? Participate in activities where you could fall or become injured. °? Drive. °? Use heavy machinery. °? Drink alcohol. °? Take sleeping pills or medicines that cause drowsiness. °? Make important decisions or sign legal documents. °? Take care of children on your own. °· Rest. °Eating and drinking °· Follow the diet recommended by your health care provider. °· If you vomit: °? Drink water, juice, or soup when you can drink without vomiting. °? Make sure you have little or no nausea before eating solid foods. °General instructions °· Have a responsible adult stay with you until you are awake and alert. °· Take over-the-counter and prescription medicines only as told by your health care provider. °· If you smoke, do not smoke   without supervision. °· Keep all follow-up visits as told by your health care provider. This is important. °Contact a health care provider if: °· You keep feeling nauseous or you keep vomiting. °· You feel light-headed. °· You develop a rash. °· You have a fever. °Get help right away if: °· You have trouble breathing. °This information is not intended to replace advice given to you by your health care provider. Make sure you discuss any questions you have with your health care provider. °Document Released: 11/14/2012 Document Revised: 01/06/2017 Document Reviewed: 05/16/2015 °Elsevier Patient Education © 2020 Elsevier Inc. ° °

## 2018-11-09 NOTE — Procedures (Signed)
Interventional Radiology Procedure Note  Procedure: US guided access of LUE graft for mechanical and pharm thrombectomy, balloon angioplasty, and restoration of flow.  Findings: No significant venous outflow stenosis was identified.  Arterial inflow narrowing of %30-40%, treated with 23mm.  No residual stenosis.   Restoration of thrill and flow.   Complications: None  Recommendations:  - Ok to flow - Do not submerge for 7 days - Routine care - Stable to ED for further treatment   Signed,  Dulcy Fanny. Earleen Newport, DO

## 2018-11-09 NOTE — Progress Notes (Signed)
Spoke with Dr. Melvia Heaps and Dr. Posey Pronto to discuss patient's need for dialysis.  He is patient of [facility].  His primary Nephrologist is listed as Dr. Lowanda Foster, however he is no longer working in the area.  Hopeful for arrangement that would allow patient to go home with medical management of his potassium overnight, outpatient dialysis tomorrow, however patient dependent on transportation services for rides to appointments and dialysis center.  Per Nephrology case manager transportation will not be available for patient tomorrow.  Again spoke with Dr. Posey Pronto, patient will need to go to the ED after declot/access obtained in IR today for urgent dialysis.   ED notified.   Brynda Greathouse, MS RD PA-C

## 2018-11-09 NOTE — Progress Notes (Signed)
Reported to Ethelle Lyon, Rn that post HD Brendan Campbell patient will be send to ED to be discharged. Ethelle Lyon informed me that she will relay this message to the charge nurse because this will be after her shift.

## 2018-11-09 NOTE — ED Triage Notes (Signed)
Pt here from IR waiting for Dialysis.

## 2018-11-09 NOTE — ED Provider Notes (Signed)
[facility] EMERGENCY DEPARTMENT Provider Note   CSN: [id number] Arrival date & time: 11/09/18  1504     History   Chief Complaint Needs dialysis.  HPI WACO FOERSTER is a 48 y.o. male.     HPI   The patient has a history of CHF, PVD, ESRD on dialysis MWF via L AV fistula who last went to dialysis 7 days ago and had a recent complication of L AV fistula thrombosis who presents to the ED with a need for dialysis following re-cannulation.  The patient normally goes to [facility] dialysis in [place] but was not able to go this week because he had to present to IR today for a clotted AV fistula. He is not on blood thinners. His potassium today was elevated at 6.6 so he was sent to the ED following re-cannulation for emergent dialysis.  Denies fevers or chills, chest pain, heart palpitations. He endorses a generalized feeling of weakness.  Past Medical History:  Diagnosis Date  . Anemia   . Anxiety   . Blood transfusion without reported diagnosis   . CHF (congestive heart failure) ([place])   . Chronic kidney disease   . Depression   . Diabetes mellitus without complication ([place])   . End stage renal disease ([place])    M/W/F [facility]  . Hyperlipidemia   . Neuropathy   . Peripheral vascular disease ([place])   . PTSD (post-traumatic stress disorder)   . Wears glasses    reading    Patient Active Problem List   Diagnosis Date Noted  . Acute pulmonary edema (HCC)   . ESRD on hemodialysis ([place])   . Hyperkalemia 10/05/2018  . Weakness 10/05/2018  . Chest pain 05/10/2016  . ESRD on dialysis ([place]) 05/10/2016  . Anemia due to end stage renal disease ([place]) 05/10/2016  . 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 Lumbar radiculopathy 03/08/2016  . S/P below knee amputation, right ([place]) 03/05/2015  . Chronic renal insufficiency, stage IV (severe) ([place]) 03/04/2015  . DM2 (diabetes mellitus, type 2) ([place]) 03/03/2015  . HLD (hyperlipidemia) 03/03/2015  . Back pain  03/03/2015    Past Surgical History:  Procedure Laterality Date  . A/V FISTULAGRAM N/A 10/09/2018   Procedure: A/V FISTULAGRAM;  Surgeon: Serafina Mitchell, MD;  Location: [place] CV LAB;  Service: Cardiovascular;  Laterality: N/A;  . AV FISTULA PLACEMENT Left 09/22/2016   Procedure: CREATION OF LEFT ARM ARTERIOVENOUS (AV) FISTULA;  Surgeon: Waynetta Sandy, MD;  Location: [place];  Service: Vascular;  Laterality: Left;  . AV FISTULA PLACEMENT Left 10/31/2017   Procedure: INSERTION OF ARTERIOVENOUS (AV) GORE-TEX 4-79m STETCH GRAFT LEFT ARM;  Surgeon: CWaynetta Sandy MD;  [place]  Service: Vascular;  Laterality: Left;  . BASCILIC VEIN TRANSPOSITION Left 08/17/2017   Procedure: SECOND STAGE BASILIC VEIN TRANSPOSITION LEFT ARM;  Surgeon: CWaynetta Sandy MD;  [place]  Service: Vascular;  Laterality: Left;  . BELOW KNEE LEG AMPUTATION Right   . CHOLECYSTECTOMY    . FOOT SURGERY    . IR FLUORO GUIDE CV LINE RIGHT  05/16/2016  . IR REMOVAL TUN CV CATH W/O FL  05/16/2016  . IR THROMBECTOMY AV FISTULA W/THROMBOLYSIS/PTA INC/SHUNT/IMG LEFT Left 11/09/2018  . IR [place] VASC ACCESS LEFT  11/09/2018  . IR [place] VASC ACCESS RIGHT  05/16/2016  . PERIPHERAL VASCULAR BALLOON ANGIOPLASTY  10/09/2018   Procedure: PERIPHERAL VASCULAR BALLOON ANGIOPLASTY;  Surgeon: BSerafina Mitchell MD;  Location: [place] CV LAB;  Service: Cardiovascular;;        Home Medications    Prior to Admission medications   Medication Sig Start Date End Date Taking? Authorizing Provider  aspirin 81 MG chewable tablet Chew 81 mg by mouth daily.   Yes [provider]  atorvastatin (LIPITOR) 10 MG tablet Take 1 tablet (10 mg total) by mouth every evening. 06/02/16  Yes Timmothy Euler, MD  insulin glargine (LANTUS) 100 UNIT/ML injection Inject 10 Units into the skin at bedtime.    Yes [provider]  multivitamin (RENA-VIT) TABS tablet Take 1 tablet by mouth daily.  08/04/18  Yes [provider]  SURE COMFORT PEN NEEDLES 31G X 5 MM MISC USE AS DIRECTED DAILY. 11/07/16  Yes Timmothy Euler, MD  blood glucose meter kit and supplies KIT Dispense based on patient and insurance preference. Use up to four times daily as directed. (FOR ICD-9 250.00, 250.01). 06/02/16   Timmothy Euler, MD    Family History Family History  Problem Relation Age of Onset  . Cancer Mother        lung  . Diabetes Mother   . Heart attack Father   . Diabetes Father   . Diabetes Sister     Social History Social History   Tobacco Use  . Smoking status: Former Smoker    Quit date: 09/03/2006    Years since quitting: 12.1  . Smokeless tobacco: Never Used  Substance Use Topics  . Alcohol use: No  . Drug use: No     Allergies   Tape   Review of Systems Review of Systems  Constitutional: Positive for fatigue. Negative for chills and fever.  HENT: Negative for sore throat.   Respiratory: Negative for cough and shortness of breath.   Cardiovascular: Negative for chest pain and palpitations.  Gastrointestinal: Negative for abdominal pain and vomiting.  Genitourinary: Positive for flank pain.  Musculoskeletal: Negative for arthralgias and back pain.  Skin: Negative for color change and rash.  Neurological: Positive for weakness. Negative for seizures and syncope.  All other systems reviewed and are negative.    Physical Exam Updated Vital Signs BP (!) 147/73 (BP Location: Right Arm)   Pulse 82   Temp 97.7 F (36.5 C) (Oral)   Resp 18   SpO2 100%   Physical Exam Vitals signs and nursing note reviewed.  Constitutional:      Appearance: He is well-developed.  HENT:     Head: Normocephalic and atraumatic.  Eyes:     Conjunctiva/sclera: Conjunctivae normal.  Neck:     Musculoskeletal: Neck supple.  Cardiovascular:     Rate and Rhythm: Normal rate and regular rhythm.     Heart sounds: No murmur.  Pulmonary:     Effort: Pulmonary effort is  normal. No respiratory distress.     Breath sounds: Normal breath sounds.  Abdominal:     Palpations: Abdomen is soft.     Tenderness: There is no abdominal tenderness.  Skin:    General: Skin is warm and dry.     Comments: L AV fistula s/p re-cannulation with occlusive dressing in place. Palpable thrill present and bruit auscultated.  Neurological:     Mental Status: He is alert.      ED Treatments / Results  Labs (all labs ordered are listed, but only abnormal results are displayed) Labs Reviewed  I-STAT CHEM 8, ED - Abnormal; Notable for the following components:  Result Value   Potassium 6.7 (*)    BUN 118 (*)    Creatinine, Ser 15.30 (*)    Glucose, Bld 65 (*)    Calcium, Ion 0.75 (*)    TCO2 21 (*)    Hemoglobin 7.5 (*)    HCT 22.0 (*)    All other components within normal limits    EKG EKG Interpretation  Date/Time:  Friday November 09 2018 15:57:38 EDT Ventricular Rate:  83 PR Interval:    QRS Duration: 148 QT Interval:  456 QTC Calculation: 536 R Axis:   84 Text Interpretation:  Sinus rhythm Prolonged PR interval Nonspecific intraventricular conduction delay Abnormal lateral Q waves Probable anteroseptal infarct, recent Confirmed by Lacretia Leigh ([id number]) on 11/09/2018 4:26:52 PM   Radiology Ir US Guide Vasc Access Left  Result Date: 11/09/2018 INDICATION: 48 year old male with left upper extremity graft/dialysis circuit thrombosed. He has been referred for attempt at restoration of flow EXAM: ULTRASOUND-GUIDED ACCESS OF LEFT UPPER EXTREMITY GRAPH X2 FISTULAGRAM MECHANICAL AND PHARMACOLOGIC THROMBECTOMY OF THROMBOSED DIALYSIS CIRCUIT ANGIOPLASTY FOR BALLOON MACERATION ANGIOPLASTY FOR TREATMENT OF ARTERIAL INFLOW STENOSIS ESTIMATED 30%-40% WITH NO RESIDUAL ON COMPLETION MEDICATIONS: None. ANESTHESIA/SEDATION: Moderate Sedation Time:  39 minutes The patient was continuously monitored during the procedure by the interventional radiology nurse under my direct  supervision. FLUOROSCOPY TIME:  Fluoroscopy Time: 5 minutes 54 seconds (30 mGy). COMPLICATIONS: None PROCEDURE: The procedure, risks, benefits, and alternatives were explained to the patient and the patient's family, including bleeding, infection, arterial thrombus, venous thrombus, vessel injury, need for further procedure, need for stenting, contrast reaction, need for catheter placement, cardiopulmonary collapse, death. Questions regarding the procedure were encouraged and answered. The patient understands and consents to the procedure. Ultrasound survey was performed with images stored and sent to PACs. The left upper extremity was prepped and draped in the usual sterile fashion. Ultrasound guidance was used to access the upper extremity graft with a micropuncture kit. Exchange was made for a 7 [place] short sheath. A Bentson wire was then a navigate into the venous outflow, with an angled catheter advanced into the venous outflow. Venogram of the left upper extremity demonstrates patency of the central venous outflow Upon withdrawal of the angled catheter, a solution of 3 milligrams of tPA in saline was infused into the outflow portion of the graft. A 5 mm balloon catheter was used to macerate the thrombosed segment, directed centrally. Balloon angioplasty of the stenotic venous segment was performed with 5 mm, 7 mm, and 9 mm balloon angioplasty. No waist was observed. Ultrasound guidance was then used access the fistula directed toward the arterial anastomosis. Once the micro sheath was in place, an exchange was made for a 6 [place] short sheath. Glidewire was navigated across the arterial anastomosis, with an over the wire Fogarty balloon advanced into the artery. The arterial plug was pulled, and then a angiogram demonstrated a stenosis at the arterial anastomosis, estimated 30%-40%. A 7 millimeter x 4 centimeter balloon was used to balloon angioplasty the arterial anastomosis. Repeat fistulogram was then  performed. Catheters and wires were removed with a complete fistulagram performed through the central vasculature. No stenosis was identified in the venous outflow. No residual stenosis at the arterial anastomosis. Excellent thrill and flow at the completion. The short sheaths were removed after placement of hemostasis sutures. Patient tolerated the procedure well and remained hemodynamically stable throughout. No complications were encountered and no significant blood loss was encountered. IMPRESSION: Status post ultrasound-guided access of left upper extremity dialysis  graft, with mechanical and pharmacologic thrombectomy, with restoration of flow and palpable thrill, and treatment of of arterial anastomosis narrowing with no residual stenosis. Signed, Dulcy Fanny. Dellia Nims, RPVI [facility] Radiology ACCESS: This access remains amenable to future percutaneous interventions as clinically indicated. Electronically Signed   By: Corrie Mckusick D.O.   On: 11/09/2018 15:53   Ir Thrombectomy Av Fistula W/thrombolysis/pta Inc/shunt/img Left  Result Date: 11/09/2018 INDICATION: 48 year old male with left upper extremity graft/dialysis circuit thrombosed. He has been referred for attempt at restoration of flow EXAM: ULTRASOUND-GUIDED ACCESS OF LEFT UPPER EXTREMITY GRAPH X2 FISTULAGRAM MECHANICAL AND PHARMACOLOGIC THROMBECTOMY OF THROMBOSED DIALYSIS CIRCUIT ANGIOPLASTY FOR BALLOON MACERATION ANGIOPLASTY FOR TREATMENT OF ARTERIAL INFLOW STENOSIS ESTIMATED 30%-40% WITH NO RESIDUAL ON COMPLETION MEDICATIONS: None. ANESTHESIA/SEDATION: Moderate Sedation Time:  39 minutes The patient was continuously monitored during the procedure by the interventional radiology nurse under my direct supervision. FLUOROSCOPY TIME:  Fluoroscopy Time: 5 minutes 54 seconds (30 mGy). COMPLICATIONS: None PROCEDURE: The procedure, risks, benefits, and alternatives were explained to the patient and the  patient's family, including bleeding, infection, arterial thrombus, venous thrombus, vessel injury, need for further procedure, need for stenting, contrast reaction, need for catheter placement, cardiopulmonary collapse, death. Questions regarding the procedure were encouraged and answered. The patient understands and consents to the procedure. Ultrasound survey was performed with images stored and sent to PACs. The left upper extremity was prepped and draped in the usual sterile fashion. Ultrasound guidance was used to access the upper extremity graft with a micropuncture kit. Exchange was made for a 7 [place] short sheath. A Bentson wire was then a navigate into the venous outflow, with an angled catheter advanced into the venous outflow. Venogram of the left upper extremity demonstrates patency of the central venous outflow Upon withdrawal of the angled catheter, a solution of 3 milligrams of tPA in saline was infused into the outflow portion of the graft. A 5 mm balloon catheter was used to macerate the thrombosed segment, directed centrally. Balloon angioplasty of the stenotic venous segment was performed with 5 mm, 7 mm, and 9 mm balloon angioplasty. No waist was observed. Ultrasound guidance was then used access the fistula directed toward the arterial anastomosis. Once the micro sheath was in place, an exchange was made for a 6 [place] short sheath. Glidewire was navigated across the arterial anastomosis, with an over the wire Fogarty balloon advanced into the artery. The arterial plug was pulled, and then a angiogram demonstrated a stenosis at the arterial anastomosis, estimated 30%-40%. A 7 millimeter x 4 centimeter balloon was used to balloon angioplasty the arterial anastomosis. Repeat fistulogram was then performed. Catheters and wires were removed with a complete fistulagram performed through the central vasculature. No stenosis was identified in the venous outflow. No residual stenosis at the arterial  anastomosis. Excellent thrill and flow at the completion. The short sheaths were removed after placement of hemostasis sutures. Patient tolerated the procedure well and remained hemodynamically stable throughout. No complications were encountered and no significant blood loss was encountered. IMPRESSION: Status post ultrasound-guided access of left upper extremity dialysis graft, with mechanical and pharmacologic thrombectomy, with restoration of flow and palpable thrill, and treatment of of arterial anastomosis narrowing with no residual stenosis. Signed, Dulcy Fanny. Dellia Nims, RPVI [facility] Radiology ACCESS: This access remains amenable to future percutaneous interventions as clinically indicated. Electronically Signed   By: Corrie Mckusick D.O.   On: 11/09/2018 15:53  Procedures Procedures (including critical care time)  Medications Ordered in ED Medications  Chlorhexidine Gluconate Cloth 2 % PADS 6 each (has no administration in time range)  pentafluoroprop-tetrafluoroeth (GEBAUERS) aerosol 1 application (has no administration in time range)  lidocaine (PF) (XYLOCAINE) 1 % injection 5 mL (has no administration in time range)  lidocaine-prilocaine (EMLA) cream 1 application (has no administration in time range)  0.9 %  sodium chloride infusion (has no administration in time range)  0.9 %  sodium chloride infusion (has no administration in time range)  heparin injection 1,000 Units (has no administration in time range)  alteplase (CATHFLO ACTIVASE) injection 2 mg (has no administration in time range)  heparin injection 3,600 Units (has no administration in time range)     Initial Impression / Assessment and Plan / ED Course  I have reviewed the triage vital signs and the nursing notes.  Pertinent labs & imaging results that were available during my care of the patient were reviewed by me and considered in my medical decision making (see chart  for details).  Clinical Course as of Nov 09 232  Fri Nov 09, 2018  1625 Potassium(!!): 6.7 [JL]    Clinical Course User Index [JL] Regan Lemming, MD       On arrival, the patient was afebrile, HDS, in NAD. He is status post re-cannulation of his L AV fistula with IR. Labs earlier this AM significant for potassium of 6.6, Cr 14.54. The patient will need urgent dialysis. EKG obtained significant for NSR rate 83, prolonged PR interval, lateral Q waves present, prolonged QTc at 536, Q waves in the anteroseptal leads. One T wave in V2 was peaked, remained normal. No QRS widening but Prolonged PR interval present.  Nephrology consulted for emergent dialysis. The patient underwent emergent dialysis and returned to the ED in stable condition. A cab voucher was provided by social work and the patient was deemed stable for discharge home.  Final Clinical Impressions(s) / ED Diagnoses   Final diagnoses:  Hyperkalemia    ED Discharge Orders    None       Regan Lemming, MD 11/10/18 5170    Lacretia Leigh, MD 11/12/18 1657

## 2018-11-10 NOTE — ED Notes (Signed)
Pt given discharge instructions.  Verbalized understanding.  Cab voucher provided to [facility] [place] by [employer] forward[employer].

## 2019-02-15 ENCOUNTER — Inpatient Hospital Stay (HOSPITAL_COMMUNITY): Payer: Medicaid Other

## 2019-02-15 ENCOUNTER — Inpatient Hospital Stay (HOSPITAL_COMMUNITY)
Admission: EM | Admit: 2019-02-15 | Discharge: 2019-02-20 | DRG: 291 | Disposition: A | Discharge status: Home / Self Care | Payer: Medicaid Other | Attending: Family Medicine | Admitting: Family Medicine

## 2019-02-15 ENCOUNTER — Encounter (HOSPITAL_COMMUNITY): Payer: Self-pay

## 2019-02-15 ENCOUNTER — Emergency Department (HOSPITAL_COMMUNITY): Payer: Medicaid Other

## 2019-02-15 ENCOUNTER — Other Ambulatory Visit: Payer: Self-pay

## 2019-02-15 DIAGNOSIS — I340 Nonrheumatic mitral (valve) insufficiency: Secondary | ICD-10-CM | POA: Diagnosis not present

## 2019-02-15 DIAGNOSIS — R531 Weakness: Secondary | ICD-10-CM

## 2019-02-15 DIAGNOSIS — R9431 Abnormal electrocardiogram [ECG] [EKG]: Secondary | ICD-10-CM

## 2019-02-15 DIAGNOSIS — I5023 Acute on chronic systolic (congestive) heart failure: Secondary | ICD-10-CM | POA: Diagnosis not present

## 2019-02-15 DIAGNOSIS — Z794 Long term (current) use of insulin: Secondary | ICD-10-CM

## 2019-02-15 DIAGNOSIS — I739 Peripheral vascular disease, unspecified: Secondary | ICD-10-CM | POA: Diagnosis not present

## 2019-02-15 DIAGNOSIS — Z833 Family history of diabetes mellitus: Secondary | ICD-10-CM

## 2019-02-15 DIAGNOSIS — Z89511 Acquired absence of right leg below knee: Secondary | ICD-10-CM | POA: Diagnosis not present

## 2019-02-15 DIAGNOSIS — E1151 Type 2 diabetes mellitus with diabetic peripheral angiopathy without gangrene: Secondary | ICD-10-CM | POA: Diagnosis present

## 2019-02-15 DIAGNOSIS — E119 Type 2 diabetes mellitus without complications: Secondary | ICD-10-CM | POA: Diagnosis not present

## 2019-02-15 DIAGNOSIS — J810 Acute pulmonary edema: Secondary | ICD-10-CM

## 2019-02-15 DIAGNOSIS — J189 Pneumonia, unspecified organism: Secondary | ICD-10-CM | POA: Diagnosis not present

## 2019-02-15 DIAGNOSIS — D631 Anemia in chronic kidney disease: Secondary | ICD-10-CM

## 2019-02-15 DIAGNOSIS — I132 Hypertensive heart and chronic kidney disease with heart failure and with stage 5 chronic kidney disease, or end stage renal disease: Secondary | ICD-10-CM | POA: Diagnosis not present

## 2019-02-15 DIAGNOSIS — I471 Supraventricular tachycardia: Secondary | ICD-10-CM | POA: Diagnosis not present

## 2019-02-15 DIAGNOSIS — R34 Anuria and oliguria: Secondary | ICD-10-CM | POA: Diagnosis present

## 2019-02-15 DIAGNOSIS — E1140 Type 2 diabetes mellitus with diabetic neuropathy, unspecified: Secondary | ICD-10-CM | POA: Diagnosis present

## 2019-02-15 DIAGNOSIS — Y832 Surgical operation with anastomosis, bypass or graft as the cause of abnormal reaction of the patient, or of later complication, without mention of misadventure at the time of the procedure: Secondary | ICD-10-CM | POA: Diagnosis present

## 2019-02-15 DIAGNOSIS — N186 End stage renal disease: Secondary | ICD-10-CM

## 2019-02-15 DIAGNOSIS — I429 Cardiomyopathy, unspecified: Secondary | ICD-10-CM | POA: Diagnosis not present

## 2019-02-15 DIAGNOSIS — Z7982 Long term (current) use of aspirin: Secondary | ICD-10-CM

## 2019-02-15 DIAGNOSIS — Z9115 Patient's noncompliance with renal dialysis: Secondary | ICD-10-CM | POA: Diagnosis not present

## 2019-02-15 DIAGNOSIS — Y712 Prosthetic and other implants, materials and accessory cardiovascular devices associated with adverse incidents: Secondary | ICD-10-CM | POA: Diagnosis not present

## 2019-02-15 DIAGNOSIS — I255 Ischemic cardiomyopathy: Secondary | ICD-10-CM | POA: Diagnosis present

## 2019-02-15 DIAGNOSIS — G629 Polyneuropathy, unspecified: Secondary | ICD-10-CM

## 2019-02-15 DIAGNOSIS — Z89512 Acquired absence of left leg below knee: Secondary | ICD-10-CM

## 2019-02-15 DIAGNOSIS — I4892 Unspecified atrial flutter: Secondary | ICD-10-CM | POA: Diagnosis not present

## 2019-02-15 DIAGNOSIS — E785 Hyperlipidemia, unspecified: Secondary | ICD-10-CM | POA: Diagnosis not present

## 2019-02-15 DIAGNOSIS — Z8249 Family history of ischemic heart disease and other diseases of the circulatory system: Secondary | ICD-10-CM

## 2019-02-15 DIAGNOSIS — N2581 Secondary hyperparathyroidism of renal origin: Secondary | ICD-10-CM | POA: Diagnosis not present

## 2019-02-15 DIAGNOSIS — T82510A Breakdown (mechanical) of surgically created arteriovenous fistula, initial encounter: Secondary | ICD-10-CM | POA: Diagnosis present

## 2019-02-15 DIAGNOSIS — Z992 Dependence on renal dialysis: Secondary | ICD-10-CM | POA: Diagnosis not present

## 2019-02-15 DIAGNOSIS — E8770 Fluid overload, unspecified: Secondary | ICD-10-CM

## 2019-02-15 DIAGNOSIS — Z9049 Acquired absence of other specified parts of digestive tract: Secondary | ICD-10-CM | POA: Diagnosis not present

## 2019-02-15 DIAGNOSIS — J811 Chronic pulmonary edema: Secondary | ICD-10-CM

## 2019-02-15 DIAGNOSIS — Z87891 Personal history of nicotine dependence: Secondary | ICD-10-CM | POA: Diagnosis not present

## 2019-02-15 DIAGNOSIS — Z20822 Contact with and (suspected) exposure to covid-19: Secondary | ICD-10-CM | POA: Diagnosis present

## 2019-02-15 DIAGNOSIS — I959 Hypotension, unspecified: Secondary | ICD-10-CM | POA: Diagnosis not present

## 2019-02-15 DIAGNOSIS — R0602 Shortness of breath: Secondary | ICD-10-CM | POA: Diagnosis present

## 2019-02-15 DIAGNOSIS — E875 Hyperkalemia: Secondary | ICD-10-CM | POA: Diagnosis present

## 2019-02-15 DIAGNOSIS — D72829 Elevated white blood cell count, unspecified: Secondary | ICD-10-CM | POA: Diagnosis present

## 2019-02-15 DIAGNOSIS — Z79899 Other long term (current) drug therapy: Secondary | ICD-10-CM

## 2019-02-15 DIAGNOSIS — I4819 Other persistent atrial fibrillation: Secondary | ICD-10-CM | POA: Diagnosis not present

## 2019-02-15 DIAGNOSIS — Z801 Family history of malignant neoplasm of trachea, bronchus and lung: Secondary | ICD-10-CM

## 2019-02-15 DIAGNOSIS — Z9862 Peripheral vascular angioplasty status: Secondary | ICD-10-CM | POA: Diagnosis not present

## 2019-02-15 DIAGNOSIS — E1122 Type 2 diabetes mellitus with diabetic chronic kidney disease: Secondary | ICD-10-CM

## 2019-02-15 DIAGNOSIS — Z91048 Other nonmedicinal substance allergy status: Secondary | ICD-10-CM

## 2019-02-15 DIAGNOSIS — I4891 Unspecified atrial fibrillation: Secondary | ICD-10-CM | POA: Diagnosis not present

## 2019-02-15 DIAGNOSIS — R079 Chest pain, unspecified: Secondary | ICD-10-CM

## 2019-02-15 DIAGNOSIS — I509 Heart failure, unspecified: Secondary | ICD-10-CM

## 2019-02-15 LAB — HEMOGLOBIN A1C
Hgb A1c MFr Bld: 5.4 % (ref 4.8–5.6)
Mean Plasma Glucose: 108.28 mg/dL

## 2019-02-15 LAB — CBC WITH DIFFERENTIAL/PLATELET
Abs Immature Granulocytes: 0.07 10*3/uL (ref 0.00–0.07)
Basophils Absolute: 0 10*3/uL (ref 0.0–0.1)
Basophils Relative: 0 %
Eosinophils Absolute: 0.1 10*3/uL (ref 0.0–0.5)
Eosinophils Relative: 1 %
HCT: 38.8 % — ABNORMAL LOW (ref 39.0–52.0)
Hemoglobin: 12.4 g/dL — ABNORMAL LOW (ref 13.0–17.0)
Immature Granulocytes: 1 %
Lymphocytes Relative: 5 %
Lymphs Abs: 0.8 10*3/uL (ref 0.7–4.0)
MCH: 34.3 pg — ABNORMAL HIGH (ref 26.0–34.0)
MCHC: 32 g/dL (ref 30.0–36.0)
MCV: 107.2 fL — ABNORMAL HIGH (ref 80.0–100.0)
Monocytes Absolute: 0.6 10*3/uL (ref 0.1–1.0)
Monocytes Relative: 4 %
Neutro Abs: 12.4 10*3/uL — ABNORMAL HIGH (ref 1.7–7.7)
Neutrophils Relative %: 89 %
Platelets: 274 10*3/uL (ref 150–400)
RBC: 3.62 MIL/uL — ABNORMAL LOW (ref 4.22–5.81)
RDW: 13 % (ref 11.5–15.5)
WBC: 14 10*3/uL — ABNORMAL HIGH (ref 4.0–10.5)
nRBC: 0 % (ref 0.0–0.2)

## 2019-02-15 LAB — BASIC METABOLIC PANEL
Anion gap: 19 — ABNORMAL HIGH (ref 5–15)
Anion gap: 15 (ref 5–15)
BUN: 86 mg/dL — ABNORMAL HIGH (ref 6–20)
BUN: 52 mg/dL — ABNORMAL HIGH (ref 6–20)
CO2: 20 mmol/L — ABNORMAL LOW (ref 22–32)
CO2: 24 mmol/L (ref 22–32)
Calcium: 7.7 mg/dL — ABNORMAL LOW (ref 8.9–10.3)
Calcium: 7.9 mg/dL — ABNORMAL LOW (ref 8.9–10.3)
Chloride: 98 mmol/L (ref 98–111)
Chloride: 94 mmol/L — ABNORMAL LOW (ref 98–111)
Creatinine, Ser: 10.37 mg/dL — ABNORMAL HIGH (ref 0.61–1.24)
Creatinine, Ser: 7.21 mg/dL — ABNORMAL HIGH (ref 0.61–1.24)
GFR calc Af Amer: 6 mL/min — ABNORMAL LOW (ref >60)
GFR calc Af Amer: 9 mL/min — ABNORMAL LOW (ref >60)
GFR calc non Af Amer: 5 mL/min — ABNORMAL LOW (ref >60)
GFR calc non Af Amer: 8 mL/min — ABNORMAL LOW (ref >60)
Glucose, Bld: 79 mg/dL (ref 70–99)
Glucose, Bld: 105 mg/dL — ABNORMAL HIGH (ref 70–99)
Potassium: >7.5 mmol/L (ref 3.5–5.1)
Potassium: 6.9 mmol/L (ref 3.5–5.1)
Sodium: 137 mmol/L (ref 135–145)
Sodium: 133 mmol/L — ABNORMAL LOW (ref 135–145)

## 2019-02-15 LAB — RENAL FUNCTION PANEL
Albumin: 3.8 g/dL (ref 3.5–5.0)
Anion gap: 15 (ref 5–15)
BUN: 51 mg/dL — ABNORMAL HIGH (ref 6–20)
CO2: 26 mmol/L (ref 22–32)
Calcium: 8.3 mg/dL — ABNORMAL LOW (ref 8.9–10.3)
Chloride: 93 mmol/L — ABNORMAL LOW (ref 98–111)
Creatinine, Ser: 7.28 mg/dL — ABNORMAL HIGH (ref 0.61–1.24)
GFR calc Af Amer: 9 mL/min — ABNORMAL LOW (ref >60)
GFR calc non Af Amer: 8 mL/min — ABNORMAL LOW (ref >60)
Glucose, Bld: 128 mg/dL — ABNORMAL HIGH (ref 70–99)
Phosphorus: 6.3 mg/dL — ABNORMAL HIGH (ref 2.5–4.6)
Potassium: 6.7 mmol/L (ref 3.5–5.1)
Sodium: 134 mmol/L — ABNORMAL LOW (ref 135–145)

## 2019-02-15 LAB — CBG MONITORING, ED
Glucose-Capillary: 80 mg/dL (ref 70–99)
Glucose-Capillary: 80 mg/dL (ref 70–99)
Glucose-Capillary: 110 mg/dL — ABNORMAL HIGH (ref 70–99)

## 2019-02-15 LAB — POC SARS CORONAVIRUS 2 AG -  ED: SARS Coronavirus 2 Ag: NEGATIVE

## 2019-02-15 LAB — RESPIRATORY PANEL BY RT PCR (FLU A&B, COVID)
Influenza A by PCR: NEGATIVE
Influenza B by PCR: NEGATIVE
SARS Coronavirus 2 by RT PCR: NEGATIVE

## 2019-02-15 LAB — TROPONIN I (HIGH SENSITIVITY)
Troponin I (High Sensitivity): 69 ng/L — ABNORMAL HIGH (ref <18)
Troponin I (High Sensitivity): 70 ng/L — ABNORMAL HIGH (ref <18)

## 2019-02-15 LAB — BRAIN NATRIURETIC PEPTIDE: B Natriuretic Peptide: 3499 pg/mL — ABNORMAL HIGH (ref 0.0–100.0)

## 2019-02-15 LAB — SARS CORONAVIRUS 2 (TAT 6-24 HRS): SARS Coronavirus 2: NEGATIVE

## 2019-02-15 IMAGING — DX DG CHEST 1V PORT
1 series · 1 of 1 positions shown · non-contrast
Comparison: Radiograph [DATE]

CLINICAL DATA: Shortness of breath, missed dialysis [REDACTED]

EXAM:
PORTABLE CHEST 1 VIEW

[chest ap]
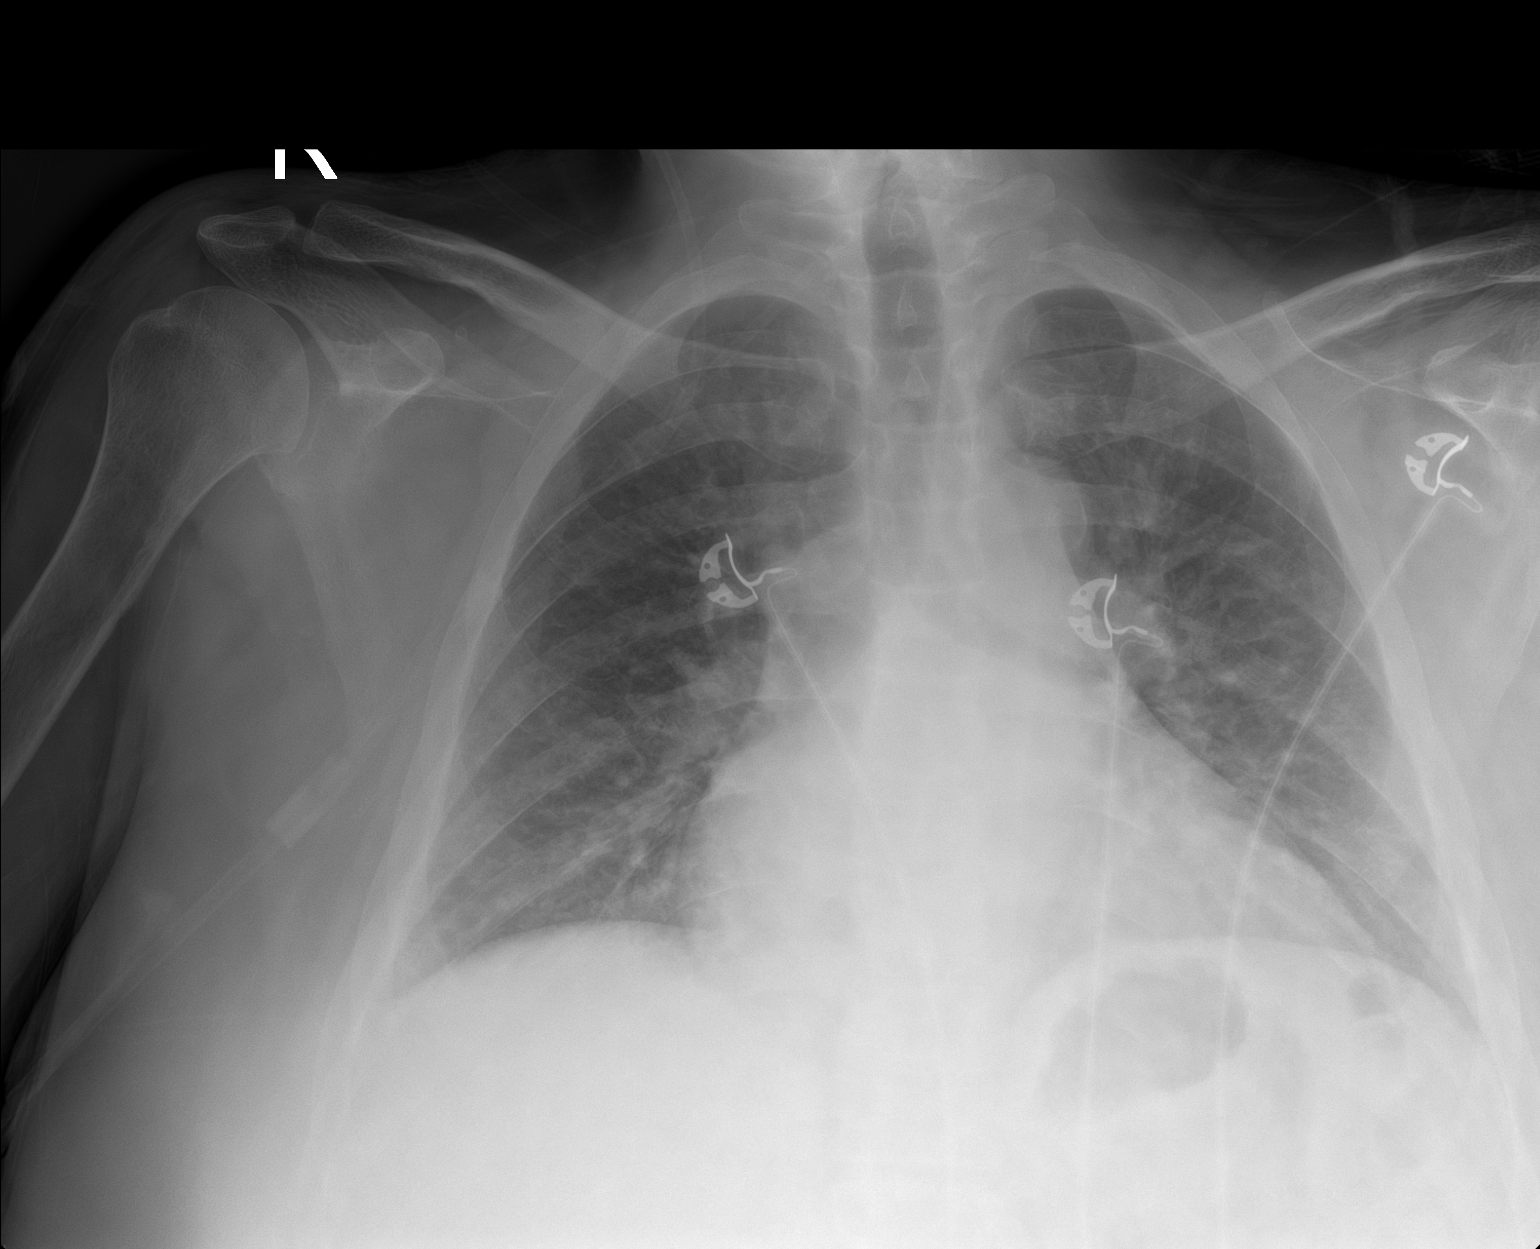

[1 of 1 positions shown; findings below may reference images not displayed]

FINDINGS: Bilateral hazy opacities are present in both lungs with some
indistinctness of the pulmonary vascularity. No pneumothorax or
effusion. Stable cardiomegaly. No acute osseous or soft tissue
abnormality.
IMPRESSION: 1. Bilateral hazy opacities in both lungs with some indistinctness
of the pulmonary vascularity, consistent with pulmonary edema though
atypical infection could have a similar appearance.
2. Stable cardiomegaly.

## 2019-02-15 MED ORDER — HEPARIN SODIUM (PORCINE) 1000 UNIT/ML DIALYSIS
1000.0000 [IU] | INTRAMUSCULAR | Status: DC | PRN
  Filled 2019-02-15: qty 1

## 2019-02-15 MED ORDER — ONDANSETRON HCL 4 MG PO TABS: 4.0000 mg | ORAL_TABLET | Freq: Four times a day (QID) | ORAL | Status: DC | PRN

## 2019-02-15 MED ORDER — SODIUM BICARBONATE 8.4 % IV SOLN: 100.0000 meq | Freq: Once | INTRAVENOUS | Status: DC

## 2019-02-15 MED ORDER — SODIUM CHLORIDE 0.9 % IV BOLUS
500.0000 mL | Freq: Once | INTRAVENOUS | Status: AC
  Administered 2019-02-15: 500 mL via INTRAVENOUS

## 2019-02-15 MED ORDER — SODIUM CHLORIDE 0.9 % IV SOLN: 100.0000 mL | INTRAVENOUS | Status: DC | PRN

## 2019-02-15 MED ORDER — SODIUM POLYSTYRENE SULFONATE 15 GM/60ML PO SUSP
45.0000 g | Freq: Once | ORAL | Status: AC
  Administered 2019-02-15: 45 g via ORAL
  Filled 2019-02-15: qty 180

## 2019-02-15 MED ORDER — ASPIRIN 81 MG PO CHEW
81.0000 mg | CHEWABLE_TABLET | Freq: Every day | ORAL | Status: DC
  Administered 2019-02-18 – 2019-02-20 (×3): 81 mg via ORAL
  Filled 2019-02-15 (×3): qty 1

## 2019-02-15 MED ORDER — LIDOCAINE-PRILOCAINE 2.5-2.5 % EX CREA
1.0000 "application " | TOPICAL_CREAM | CUTANEOUS | Status: DC | PRN
  Filled 2019-02-15: qty 5

## 2019-02-15 MED ORDER — INSULIN ASPART 100 UNIT/ML IV SOLN: 10.0000 [IU] | Freq: Once | INTRAVENOUS | Status: DC

## 2019-02-15 MED ORDER — ALTEPLASE 2 MG IJ SOLR
2.0000 mg | Freq: Once | INTRAMUSCULAR | Status: DC | PRN
  Filled 2019-02-15: qty 2

## 2019-02-15 MED ORDER — CHLORHEXIDINE GLUCONATE CLOTH 2 % EX PADS
6.0000 | MEDICATED_PAD | Freq: Every day | CUTANEOUS | Status: DC
  Administered 2019-02-16 – 2019-02-18 (×3): 6 via TOPICAL

## 2019-02-15 MED ORDER — HEPARIN SODIUM (PORCINE) 5000 UNIT/ML IJ SOLN
5000.0000 [IU] | Freq: Three times a day (TID) | INTRAMUSCULAR | Status: DC
  Administered 2019-02-15 – 2019-02-17 (×6): 5000 [IU] via SUBCUTANEOUS
  Filled 2019-02-15 (×6): qty 1

## 2019-02-15 MED ORDER — SODIUM POLYSTYRENE SULFONATE 15 GM/60ML PO SUSP: 45.0000 g | Freq: Once | ORAL | Status: DC

## 2019-02-15 MED ORDER — SODIUM ZIRCONIUM CYCLOSILICATE 5 G PO PACK
10.0000 g | PACK | Freq: Once | ORAL | Status: AC
  Administered 2019-02-15: 10 g via ORAL
  Filled 2019-02-15: qty 2

## 2019-02-15 MED ORDER — ONDANSETRON HCL 4 MG/2ML IJ SOLN: 4.0000 mg | Freq: Four times a day (QID) | INTRAMUSCULAR | Status: DC | PRN

## 2019-02-15 MED ORDER — INSULIN ASPART 100 UNIT/ML ~~LOC~~ SOLN: 10.0000 [IU] | Freq: Once | SUBCUTANEOUS | Status: DC

## 2019-02-15 MED ORDER — INSULIN ASPART 100 UNIT/ML IV SOLN
3.0000 [IU] | Freq: Once | INTRAVENOUS | Status: AC
  Administered 2019-02-15: 3 [IU] via INTRAVENOUS

## 2019-02-15 MED ORDER — ATORVASTATIN CALCIUM 10 MG PO TABS
10.0000 mg | ORAL_TABLET | Freq: Every evening | ORAL | Status: DC
  Administered 2019-02-15 – 2019-02-20 (×6): 10 mg via ORAL
  Filled 2019-02-15 (×6): qty 1

## 2019-02-15 MED ORDER — CALCIUM GLUCONATE-NACL 1-0.675 GM/50ML-% IV SOLN
INTRAVENOUS | Status: AC
  Filled 2019-02-15: qty 50

## 2019-02-15 MED ORDER — ACETAMINOPHEN 650 MG RE SUPP: 650.0000 mg | Freq: Four times a day (QID) | RECTAL | Status: DC | PRN

## 2019-02-15 MED ORDER — RENA-VITE PO TABS
1.0000 | ORAL_TABLET | Freq: Every day | ORAL | Status: DC
  Administered 2019-02-18 – 2019-02-19 (×2): 1 via ORAL
  Filled 2019-02-15 (×2): qty 1

## 2019-02-15 MED ORDER — CALCIUM GLUCONATE-NACL 1-0.675 GM/50ML-% IV SOLN
1.0000 g | Freq: Once | INTRAVENOUS | Status: AC
  Administered 2019-02-15: 05:00:00 1000 mg via INTRAVENOUS
  Filled 2019-02-15: qty 50

## 2019-02-15 MED ORDER — DEXTROSE 50 % IV SOLN
1.0000 | Freq: Once | INTRAVENOUS | Status: AC
  Administered 2019-02-15: 50 mL via INTRAVENOUS
  Filled 2019-02-15: qty 50

## 2019-02-15 MED ORDER — SENNOSIDES-DOCUSATE SODIUM 8.6-50 MG PO TABS
1.0000 | ORAL_TABLET | Freq: Every evening | ORAL | Status: DC | PRN
  Filled 2019-02-15: qty 1

## 2019-02-15 MED ORDER — FUROSEMIDE 10 MG/ML IJ SOLN
80.0000 mg | Freq: Once | INTRAMUSCULAR | Status: AC
  Administered 2019-02-15: 06:00:00 80 mg via INTRAVENOUS
  Filled 2019-02-15: qty 8

## 2019-02-15 MED ORDER — HEPARIN SOD (PORK) LOCK FLUSH 100 UNIT/ML IV SOLN
3500.0000 [IU] | Freq: Once | INTRAVENOUS | Status: AC
  Administered 2019-02-15: 3500 [IU]
  Filled 2019-02-15 (×2): qty 35

## 2019-02-15 MED ORDER — HEPARIN SODIUM (PORCINE) 1000 UNIT/ML DIALYSIS
3100.0000 [IU] | INTRAMUSCULAR | Status: DC | PRN
  Filled 2019-02-15 (×3): qty 4

## 2019-02-15 MED ORDER — PANTOPRAZOLE SODIUM 40 MG PO TBEC
40.0000 mg | DELAYED_RELEASE_TABLET | Freq: Every day | ORAL | Status: DC
  Administered 2019-02-15 – 2019-02-20 (×5): 40 mg via ORAL
  Filled 2019-02-15 (×4): qty 1

## 2019-02-15 MED ORDER — DOXYCYCLINE HYCLATE 100 MG PO TABS
100.0000 mg | ORAL_TABLET | Freq: Two times a day (BID) | ORAL | Status: AC
  Administered 2019-02-15 – 2019-02-17 (×5): 100 mg via ORAL
  Filled 2019-02-15 (×5): qty 1

## 2019-02-15 MED ORDER — HEPARIN SODIUM (PORCINE) 1000 UNIT/ML DIALYSIS
20.0000 [IU]/kg | INTRAMUSCULAR | Status: DC | PRN
  Filled 2019-02-15 (×2): qty 3

## 2019-02-15 MED ORDER — SODIUM CHLORIDE 0.9 % IV SOLN
1.0000 g | Freq: Once | INTRAVENOUS | Status: AC
  Administered 2019-02-15: 07:00:00 1 g via INTRAVENOUS
  Filled 2019-02-15: qty 10

## 2019-02-15 MED ORDER — FENTANYL CITRATE (PF) 100 MCG/2ML IJ SOLN
50.0000 ug | Freq: Once | INTRAMUSCULAR | Status: AC
  Administered 2019-02-15: 50 ug via INTRAVENOUS
  Filled 2019-02-15: qty 2

## 2019-02-15 MED ORDER — SODIUM CHLORIDE 0.9 % IV SOLN
1.0000 g | Freq: Once | INTRAVENOUS | Status: DC
  Filled 2019-02-15: qty 10

## 2019-02-15 MED ORDER — DEXTROSE 10 % IV SOLN: Freq: Once | INTRAVENOUS | Status: DC

## 2019-02-15 MED ORDER — LIDOCAINE HCL (PF) 1 % IJ SOLN: 5.0000 mL | INTRAMUSCULAR | Status: DC | PRN

## 2019-02-15 MED ORDER — ACETAMINOPHEN 325 MG PO TABS
650.0000 mg | ORAL_TABLET | Freq: Four times a day (QID) | ORAL | Status: DC | PRN
  Administered 2019-02-17 – 2019-02-20 (×3): 650 mg via ORAL
  Filled 2019-02-15 (×4): qty 2

## 2019-02-15 MED ORDER — INSULIN ASPART 100 UNIT/ML IV SOLN: 4.0000 [IU] | Freq: Once | INTRAVENOUS | Status: DC

## 2019-02-15 MED ORDER — INSULIN ASPART 100 UNIT/ML ~~LOC~~ SOLN: 0.0000 [IU] | Freq: Three times a day (TID) | SUBCUTANEOUS | Status: DC

## 2019-02-15 MED ORDER — SODIUM ZIRCONIUM CYCLOSILICATE 5 G PO PACK: 10.0000 g | PACK | Freq: Once | ORAL | Status: DC

## 2019-02-15 MED ORDER — OXYCODONE HCL 5 MG PO TABS
5.0000 mg | ORAL_TABLET | Freq: Four times a day (QID) | ORAL | Status: AC | PRN
  Administered 2019-02-15 – 2019-02-16 (×3): 5 mg via ORAL
  Filled 2019-02-15 (×3): qty 1

## 2019-02-15 MED ORDER — PENTAFLUOROPROP-TETRAFLUOROETH EX AERO: 1.0000 "application " | INHALATION_SPRAY | CUTANEOUS | Status: DC | PRN

## 2019-02-15 NOTE — ED Notes (Signed)
Dr. Bridges at bedside 

## 2019-02-15 NOTE — ED Notes (Signed)
Dr Wynetta Emery called to unit, pt being transferred to [facility].

## 2019-02-15 NOTE — ED Notes (Signed)
Dr Royce Macadamia called and given update and critical potassium 6.7. Dr Royce Macadamia to call and consult with Dr Wynetta Emery.

## 2019-02-15 NOTE — ED Notes (Signed)
Attempted [place] IV in right upper arm, IV infiltrated with saline flush push.

## 2019-02-15 NOTE — ED Notes (Signed)
Hospitalist called.

## 2019-02-15 NOTE — ED Triage Notes (Signed)
Pt from home, in via [facility] for sob and chest pain, states he missed dialysis on Wednesday b/c "I just didn't feel good".  Pt report abd discomfort, no vomiting or diarrhea, mild nausea only

## 2019-02-15 NOTE — Significant Event (Signed)
02/15/2019 6:36 PM  I spoke with our nephrologist on-call Dr. Royce Macadamia regarding the repeat potassium level being 6.7.  We are both very concerned that the potassium remains this high after medications given earlier and hemodialysis treatment.  Lokelma was ordered.  In addition Dr. Royce Macadamia requested that the patient be transferred to [facility] because there is concern that he may have  issues with his dialysis access and likely is going to needs daily hemodialysis.  I have placed an order to admit to [facility] and I have called the patient placement team and requested a telemetry bed at [facility].  We will continue to monitor closely.  He will remain in the emergency department till the bed at [facility] is available for him to transfer.  At this time he remains asymptomatic.  His vitals remained stable.  Continue to monitor.  Dr. Royce Macadamia also ordered glucose and insulin which the nurses giving him now.    Murvin Natal MD  How to contact the [facility] Attending or Consulting provider [place] or covering provider during after hours [place], for this patient?  1. Check the care team in [facility] and look for a) attending/consulting [facility] provider listed and b) the [facility] team listed 2. Log into [email or web address] and use [place]'s universal password to access. If you do not have the password, please contact the hospital operator. 3. Locate the [facility] provider you are looking for under [facility] and page to a number that you can be directly reached. 4. If you still have difficulty reaching the provider, please page the [facility] (Director on Call) for the Hospitalists listed on amion for assistance.

## 2019-02-15 NOTE — ED Notes (Signed)
CRITICAL VALUE ALERT  Critical Value: Potassium >7.5  Date & Time Notied: 02/15/19 0555  Provider Notified: Dr. Dayna Barker  Orders Received/Actions taken: n/a

## 2019-02-15 NOTE — Consult Note (Signed)
[facility] Renal Consultation Note    Indication for Consultation:  Management of ESRD/hemodialysis; anemia, hypertension/volume and secondary hyperparathyroidism  HPI: Brendan Campbell is a 49 y.o. male.  Brendan Campbell has a PMH significant for DM, HTN, PTSD, HLD, CHF, noncompliance, PVD s/p RBKA, and ESRD on HD every MWF who presented to [facility] ED with worsening shortness of breath and not feeling well.  He chronically skips his Wednesday dialysis despite multiple admissions for pulmonary edema/volume overload and hyperkalemia and presented today with the same issues.  His K was >7.5 and CXR with pulmonary edema.  His EKG has widened QRS and bradycardia.  His peripheral IV was lost so he did not receive the ordered Calcium/insulin/D50.  We were consulted to provide urgent HD.    Past Medical History:  Diagnosis Date  . Anemia   . Anxiety   . Blood transfusion without reported diagnosis   . CHF (congestive heart failure) ([place])   . Chronic kidney disease   . Depression   . Diabetes mellitus without complication ([place])   . End stage renal disease ([place])    M/W/F [facility]  . Hyperlipidemia   . Neuropathy   . Peripheral vascular disease ([place])   . PTSD (post-traumatic stress disorder)   . Wears glasses    reading   Past Surgical History:  Procedure Laterality Date  . A/V FISTULAGRAM N/A 10/09/2018   Procedure: A/V FISTULAGRAM;  Surgeon: Serafina Mitchell, MD;  Location: [place] [facility];  Service: Cardiovascular;  Laterality: N/A;  . AV FISTULA PLACEMENT Left 09/22/2016   Procedure: CREATION OF LEFT ARM ARTERIOVENOUS (AV) FISTULA;  Surgeon: Waynetta Sandy, MD;  Location: [place];  Service: Vascular;  Laterality: Left;  . AV FISTULA PLACEMENT Left 10/31/2017   Procedure: INSERTION OF ARTERIOVENOUS (AV) GORE-TEX 4-3mm STETCH GRAFT LEFT ARM;  Surgeon: Waynetta Sandy, MD;  Location: [place];  Service: Vascular;  Laterality: Left;  . BASCILIC VEIN TRANSPOSITION Left  08/17/2017   Procedure: SECOND STAGE BASILIC VEIN TRANSPOSITION LEFT ARM;  Surgeon: Waynetta Sandy, MD;  Location: [place];  Service: Vascular;  Laterality: Left;  . BELOW KNEE LEG AMPUTATION Right   . CHOLECYSTECTOMY    . FOOT SURGERY    . IR FLUORO GUIDE CV LINE RIGHT  05/16/2016  . IR REMOVAL TUN CV CATH W/O FL  05/16/2016  . IR THROMBECTOMY AV FISTULA W/THROMBOLYSIS/PTA INC/SHUNT/IMG LEFT Left 11/09/2018  . IR US GUIDE VASC ACCESS LEFT  11/09/2018  . IR US GUIDE VASC ACCESS RIGHT  05/16/2016  . PERIPHERAL VASCULAR BALLOON ANGIOPLASTY  10/09/2018   Procedure: PERIPHERAL VASCULAR BALLOON ANGIOPLASTY;  Surgeon: Serafina Mitchell, MD;  Location: [place] [facility];  Service: Cardiovascular;;   Family History:   Family History  Problem Relation Age of Onset  . Cancer Mother        lung  . Diabetes Mother   . Heart attack Father   . Diabetes Father   . Diabetes Sister    Social History:  reports that he quit smoking about 12 years ago. He has never used smokeless tobacco. He reports that he does not drink alcohol or use drugs. Allergies  Allergen Reactions  . Tape Other (See Comments)    Pulls skin off   Prior to Admission medications   Medication Sig Start Date End Date Taking? Authorizing Provider  aspirin 81 MG chewable tablet Chew 81 mg by mouth daily.   Yes [provider]  LANTUS SOLOSTAR 100  UNIT/ML Solostar Pen Inject 10 Units into the skin at bedtime.  01/18/19  Yes [provider]  multivitamin (RENA-VIT) TABS tablet Take 1 tablet by mouth daily. 08/04/18  Yes [provider]   Current Facility-Administered Medications  Medication Dose Route Frequency Provider Last Rate Last Admin  . 0.9 %  sodium chloride infusion  100 mL Intravenous PRN Donato Heinz, MD      . 0.9 %  sodium chloride infusion  100 mL Intravenous PRN Donato Heinz, MD      . acetaminophen (TYLENOL) tablet 650 mg  650 mg Oral Q6H PRN Johnson, Clanford L, MD       Or   . acetaminophen (TYLENOL) suppository 650 mg  650 mg Rectal Q6H PRN Johnson, Clanford L, MD      . alteplase (CATHFLO ACTIVASE) injection 2 mg  2 mg Intracatheter Once PRN Donato Heinz, MD      . Derrill Memo ON 02/16/2019] aspirin chewable tablet 81 mg  81 mg Oral Daily Johnson, Clanford L, MD      . atorvastatin (LIPITOR) tablet 10 mg  10 mg Oral QPM Johnson, Clanford L, MD      . Chlorhexidine Gluconate Cloth 2 % PADS 6 each  6 each Topical Q0600 Donato Heinz, MD      . doxycycline (VIBRA-TABS) tablet 100 mg  100 mg Oral Q12H Johnson, Clanford L, MD      . heparin injection 1,000 Units  1,000 Units Dialysis PRN Donato Heinz, MD      . heparin injection 2,100 Units  20 Units/kg Dialysis PRN Donato Heinz, MD      . heparin injection 5,000 Units  5,000 Units Subcutaneous Q8H Johnson, Clanford L, MD      . insulin aspart (novoLOG) injection 0-6 Units  0-6 Units Subcutaneous TID WC Johnson, Clanford L, MD      . insulin aspart (novoLOG) injection 10 Units  10 Units Subcutaneous Once Johnson, Clanford L, MD      . lidocaine (PF) (XYLOCAINE) 1 % injection 5 mL  5 mL Intradermal PRN Donato Heinz, MD      . lidocaine-prilocaine (EMLA) cream 1 application  1 application Topical PRN Donato Heinz, MD      . multivitamin (RENA-VIT) tablet 1 tablet  1 tablet Oral Daily Johnson, Clanford L, MD      . ondansetron (ZOFRAN) tablet 4 mg  4 mg Oral Q6H PRN Johnson, Clanford L, MD       Or  . ondansetron (ZOFRAN) injection 4 mg  4 mg Intravenous Q6H PRN Johnson, Clanford L, MD      . pantoprazole (PROTONIX) EC tablet 40 mg  40 mg Oral Q0600 Johnson, Clanford L, MD      . pentafluoroprop-tetrafluoroeth (GEBAUERS) aerosol 1 application  1 application Topical PRN Donato Heinz, MD      . senna-docusate (Senokot-S) tablet 1 tablet  1 tablet Oral QHS PRN Johnson, Clanford L, MD      . sodium bicarbonate injection 100 mEq  100 mEq Intravenous Once Wynetta Emery, Clanford L, MD        Current Outpatient Medications  Medication Sig Dispense Refill  . aspirin 81 MG chewable tablet Chew 81 mg by mouth daily.    [name] LANTUS SOLOSTAR 100 UNIT/ML Solostar Pen Inject 10 Units into the skin at bedtime.     . multivitamin (RENA-VIT) TABS tablet Take 1 tablet by mouth daily.     Labs: Basic Metabolic Panel: Recent Labs  Lab 02/15/19 0500  NA  137  K >7.5*  CL 98  CO2 20*  GLUCOSE 79  BUN 86*  CREATININE 10.37*  CALCIUM 7.7*   Liver Function Tests: No results for input(s): AST, ALT, ALKPHOS, BILITOT, PROT, ALBUMIN in the last 168 hours. No results for input(s): LIPASE, AMYLASE in the last 168 hours. No results for input(s): AMMONIA in the last 168 hours. CBC: Recent Labs  Lab 02/15/19 0500  WBC 14.0*  NEUTROABS 12.4*  HGB 12.4*  HCT 38.8*  MCV 107.2*  PLT 274   Cardiac Enzymes: No results for input(s): CKTOTAL, CKMB, CKMBINDEX, TROPONINI in the last 168 hours. CBG: Recent Labs  Lab 02/15/19 0624  GLUCAP 80   Iron Studies: No results for input(s): IRON, TIBC, TRANSFERRIN, FERRITIN in the last 72 hours. Studies/Results: DG Chest Portable 1 View  Result Date: 02/15/2019 CLINICAL DATA:  Shortness of breath, missed dialysis Wednesday EXAM: PORTABLE CHEST 1 VIEW COMPARISON:  Radiograph 11/02/2018 FINDINGS: Bilateral hazy opacities are present in both lungs with some indistinctness of the pulmonary vascularity. No pneumothorax or effusion. Stable cardiomegaly. No acute osseous or soft tissue abnormality. IMPRESSION: 1. Bilateral hazy opacities in both lungs with some indistinctness of the pulmonary vascularity, consistent with pulmonary edema though atypical infection could have a similar appearance. 2. Stable cardiomegaly. Electronically Signed   By: Lovena Le M.D.   On: 02/15/2019 05:18    ROS: A comprehensive review of systems was negative except for: Constitutional: positive for fatigue and malaise Respiratory: positive for shortness of breath and "lung  pain" Physical Exam: Vitals:   02/15/19 0423 02/15/19 0426  BP:  (!) 148/92  Pulse:  (!) 116  Resp:  (!) 28  Temp:  98.4 F (36.9 C)  TempSrc:  Oral  SpO2:  93%  Weight: 104.3 kg   Height: 6' (1.829 m)       Weight change:   Intake/Output Summary (Last 24 hours) at 02/15/2019 1022 Last data filed at 02/15/2019 [id number] Gross per 24 hour  Intake 110 ml  Output --  Net 110 ml   BP (!) 148/92 (BP Location: Right Arm)   Pulse (!) 116   Temp 98.4 F (36.9 C) (Oral)   Resp (!) 28   Ht 6' (1.829 m)   Wt 104.3 kg   SpO2 93%   BMI 31.19 kg/m  General appearance: no distress, slowed mentation and flat affect Head: Normocephalic, without obvious abnormality, atraumatic Resp: rales bibasilar Cardio: no rub and tachycardic at 116 GI: soft, non-tender; bowel sounds normal; no masses,  no organomegaly Extremities: edema trace left lower ext and s/p RBKA, LUE AVG +T/B Dialysis Access:  Dialysis Orders: [facility]: [name]  on MWF . EDW 87.5kg and prosthetic weighs 1.6 kg HD Bath 1K/2.25Ca  Time 4:15 Heparin 1,000 units bolus then 500 units/hr. Access LUE AVG BFR 300 DFR 600    Hectoral 0.5 mcg IV/HD Epogen 600   Units IV/HD      Assessment/Plan: 1.  Hyperkalemia and volume overload- due to noncompliance with HD.  Plan for urgent HD with low K bath and UF as bp tolerates.  Discussed with him the risks of noncompliance and he admits that "I messed up and won't do it again" 2. Vascular access-  Unable to cannulate LUE AVG (which has been an issue for some time and had thrombectomy in October 2020 but he does not follow up well).  Dr. Constance Haw was consulted and kindly placed a femoral temporary HD catheter in the ED.  Will need to address  this as it has been an ongoing issue.   3.  ESRD -  Plan for urgent HD as above 4.  Hypertension/volume  - UF as tolerated 5.  Anemia  - hold ESA for Hgb >12 6.  Metabolic bone disease -  Resume home meds and renal diet 7.  Nutrition -  Renal diet  Donetta Potts, MD [facility] Pager [phone] 02/15/2019, 10:22 AM

## 2019-02-15 NOTE — ED Notes (Signed)
Dr Wynetta Emery paged and informed loss of IV access after giving 1/2 amp D50.

## 2019-02-15 NOTE — ED Notes (Signed)
Dialysis nurse at bedside

## 2019-02-15 NOTE — Procedures (Signed)
I was present at this dialysis session. I have reviewed the session itself and made appropriate changes.   Vital signs in last 24 hours:  Temp:  [98.4 F (36.9 C)] 98.4 F (36.9 C) (01/08 0426) Pulse Rate:  [116] 116 (01/08 0426) Resp:  [28] 28 (01/08 0426) BP: (148)/(92) 148/92 (01/08 0426) SpO2:  [93 %] 93 % (01/08 0426) Weight:  [104.3 kg] 104.3 kg (01/08 0423) Weight change:  Filed Weights   02/15/19 0423  Weight: 104.3 kg    Recent Labs  Lab 02/15/19 0500  NA 137  K >7.5*  CL 98  CO2 20*  GLUCOSE 79  BUN 86*  CREATININE 10.37*  CALCIUM 7.7*    Recent Labs  Lab 02/15/19 0500  WBC 14.0*  NEUTROABS 12.4*  HGB 12.4*  HCT 38.8*  MCV 107.2*  PLT 274    Scheduled Meds: . [START ON 02/16/2019] aspirin  81 mg Oral Daily  . atorvastatin  10 mg Oral QPM  . Chlorhexidine Gluconate Cloth  6 each Topical Q0600  . doxycycline  100 mg Oral Q12H  . heparin  5,000 Units Subcutaneous Q8H  . insulin aspart  0-6 Units Subcutaneous TID WC  . insulin aspart  10 Units Subcutaneous Once  . multivitamin  1 tablet Oral Daily  . pantoprazole  40 mg Oral Q0600  . sodium bicarbonate  100 mEq Intravenous Once   Continuous Infusions: . sodium chloride    . sodium chloride     PRN Meds:.sodium chloride, sodium chloride, acetaminophen **OR** acetaminophen, alteplase, heparin, heparin, lidocaine (PF), lidocaine-prilocaine, ondansetron **OR** ondansetron (ZOFRAN) IV, pentafluoroprop-tetrafluoroeth, senna-docusate   Donetta Potts,  MD 02/15/2019, 10:46 AM

## 2019-02-15 NOTE — Procedures (Signed)
Procedure Note  02/15/19    Preoperative Diagnosis: End stage renal disease without dialysis access, hyperkalemia   Postoperative Diagnosis: Same   Procedure(s) Performed: Temporal dialysis line placement, left femoral (Ultrasound guidance)    Surgeon: Lanell Matar. Constance Haw, MD   Assistants: None   Anesthesia: 1% lidocaine    Complications: None    Indications: Mr. Weitkamp is a 49 y.o. with end stage renal disease and a poorly functioning left upper extremity graft that was attempted to be cannulated but no access for dialysis flow could be obtained. He needs emergent dialysis due to missing his treatment Wednesday and comes in with hyperkalemia, fluid overload and symptoms of shortness of breath. I discussed the risk and benefits of placement of the temporary dialysis line with him, including but not limited to bleeding, infection, and risk of injury to the vessels.  He has given verbal and written consent for the procedure.    Procedure: The patient placed supine. The left groin was prepped and draped in the usual sterile fashion.  Wearing full gown and gloves, I performed the procedure.  One percent lidocaine was used for local anesthesia. An ultrasound was utilized to assess the left femoral vein.  The needle with syringe was advanced into the vein with dark venous return, and a wire was placed using the Seldinger technique without difficulty.  The skin was knicked and a dilator was placed, and the two lumen dialysis catheter was placed over the wire with continued control of the wire.  There was good draw back of blood from all lumens and each flushed easily with saline.  The catheter was secured in 2 points with 2-0 silk and a biopatch and dressing was placed.     The patient tolerated the procedure well. The dialysis RN was present to initiate dialysis and pack the line with heparin following his treatment.   Curlene Labrum, MD [facility] [address] [place], [place] [id number] [phone] (office)

## 2019-02-15 NOTE — H&P (Addendum)
History and Physical  [place] OTR:[id number] DOB: Feb 25, 1970 DOA: 02/15/2019  PCP: Patient, No Pcp Per   Patient coming from: Home   I have personally briefly reviewed patient's old medical records in [place]  Chief Complaint: shortness of breath  HPI: Brendan Campbell is a 49 y.o. male with medical history significant of end-stage renal disease on hemodialysis Monday Wednesday Friday presented to the emergency department complaining of progressive shortness of breath after missing hemodialysis on Wednesday.  His last treatment was on Monday of this week.  He denies fever and cough.  He denies chest pain.  His main complaint is progressive weakness and shortness of breath.  He reports pain when taking deep breaths.  He reports some nausea and vomiting.  He denies any known sick contacts.  ED Course: Patient arrived and immediately had an EKG with findings of peaked T waves.  He was initially given IV calcium prior to labs returning.  His labs came back showingA potassium calcium greater than 7.5.  Creatinine 10.37, anion gap 19, BNP 3499, troponin 69, WBC 14.0, hemoglobin 12.4, platelet count 274.  Respiratory panel was negative including negative coronavirus test.  He was given a dose of Lasix, sodium bicarbonate, glucose and insulin was ordered.  Nephrology was consulted and made arrangements for urgent hemodialysis treatment.  Review of Systems: As per HPI otherwise 10 point review of systems negative.    Past Medical History:  Diagnosis Date  . Anemia   . Anxiety   . Blood transfusion without reported diagnosis   . CHF (congestive heart failure) ([place])   . Chronic kidney disease   . Depression   . Diabetes mellitus without complication ([place])   . End stage renal disease ([place])    M/W/F [facility]  . Hyperlipidemia   . Neuropathy   . Peripheral vascular disease ([place])   . PTSD (post-traumatic stress disorder)   . Wears glasses    reading    Past  Surgical History:  Procedure Laterality Date  . A/V FISTULAGRAM N/A 10/09/2018   Procedure: A/V FISTULAGRAM;  Surgeon: Serafina Mitchell, MD;  Location: [place] [facility];  Service: Cardiovascular;  Laterality: N/A;  . AV FISTULA PLACEMENT Left 09/22/2016   Procedure: CREATION OF LEFT ARM ARTERIOVENOUS (AV) FISTULA;  Surgeon: Waynetta Sandy, MD;  Location: [place];  Service: Vascular;  Laterality: Left;  . AV FISTULA PLACEMENT Left 10/31/2017   Procedure: INSERTION OF ARTERIOVENOUS (AV) GORE-TEX 4-48m STETCH GRAFT LEFT ARM;  Surgeon: CWaynetta Sandy MD;  Location: [place]  Service: Vascular;  Laterality: Left;  . BASCILIC VEIN TRANSPOSITION Left 08/17/2017   Procedure: SECOND STAGE BASILIC VEIN TRANSPOSITION LEFT ARM;  Surgeon: CWaynetta Sandy MD;  [place]  Service: Vascular;  Laterality: Left;  . BELOW KNEE LEG AMPUTATION Right   . CHOLECYSTECTOMY    . FOOT SURGERY    . IR FLUORO GUIDE CV LINE RIGHT  05/16/2016  . IR REMOVAL TUN CV CATH W/O FL  05/16/2016  . IR THROMBECTOMY AV FISTULA W/THROMBOLYSIS/PTA INC/SHUNT/IMG LEFT Left 11/09/2018  . IR [place] VASC ACCESS LEFT  11/09/2018  . IR [place] VASC ACCESS RIGHT  05/16/2016  . PERIPHERAL VASCULAR BALLOON ANGIOPLASTY  10/09/2018   Procedure: PERIPHERAL VASCULAR BALLOON ANGIOPLASTY;  Surgeon: BSerafina Mitchell MD;  [facility];  Service: Cardiovascular;;     reports that he quit smoking about 12 years ago. He has never used  smokeless tobacco. He reports that he does not drink alcohol or use drugs.  Allergies  Allergen Reactions  . Tape Other (See Comments)    Pulls skin off    Family History  Problem Relation Age of Onset  . Cancer Mother        lung  . Diabetes Mother   . Heart attack Father   . Diabetes Father   . Diabetes Sister      Prior to Admission medications   Medication Sig Start Date End Date Taking? Authorizing Provider  aspirin 81 MG chewable tablet Chew 81 mg by mouth  daily.    [provider]  atorvastatin (LIPITOR) 10 MG tablet Take 1 tablet (10 mg total) by mouth every evening. 06/02/16   Timmothy Euler, MD  blood glucose meter kit and supplies KIT Dispense based on patient and insurance preference. Use up to four times daily as directed. (FOR ICD-9 250.00, 250.01). 06/02/16   Timmothy Euler, MD  insulin glargine (LANTUS) 100 UNIT/ML injection Inject 10 Units into the skin at bedtime.     [provider]  multivitamin (RENA-VIT) TABS tablet Take 1 tablet by mouth daily. 08/04/18   [provider]  SURE COMFORT PEN NEEDLES 31G X 5 MM MISC USE AS DIRECTED DAILY. 11/07/16   Timmothy Euler, MD    Physical Exam: Vitals:   02/15/19 0423 02/15/19 0426  BP:  (!) 148/92  Pulse:  (!) 116  Resp:  (!) 28  Temp:  98.4 F (36.9 C)  TempSrc:  Oral  SpO2:  93%  Weight: 104.3 kg   Height: 6' (1.829 m)     Constitutional: NAD, calm, comfortable Vitals:   02/15/19 0423 02/15/19 0426  BP:  (!) 148/92  Pulse:  (!) 116  Resp:  (!) 28  Temp:  98.4 F (36.9 C)  TempSrc:  Oral  SpO2:  93%  Weight: 104.3 kg   Height: 6' (1.829 m)    Eyes: Chronically ill-appearing male, lying in the bed he is awake and alert PERRL, lids and conjunctivae normal ENMT: Mucous membranes are moist. Posterior pharynx clear of any exudate or lesions.  Neck: normal, supple, no masses, no thyromegaly Respiratory: Upper anterior airway congestion noises, Rales heard in the left lower lobe and some crackles heard. Normal respiratory effort. No accessory muscle use.  Cardiovascular: Tachycardic rate, normal S1-S2 sounds.   Abdomen: no tenderness, no masses palpated. No hepatosplenomegaly. Bowel sounds positive.  Musculoskeletal: no clubbing / cyanosis. Right BKA.   Skin: no rashes, lesions, ulcers. No induration Neurologic: CN 2-12 grossly intact. Sensation intact, DTR normal. Strength 5/5 in all 4.  Psychiatric:  Normal mood.   Labs on Admission: I  have personally reviewed following labs and imaging studies  CBC: Recent Labs  Lab 02/15/19 0500  WBC 14.0*  NEUTROABS 12.4*  HGB 12.4*  HCT 38.8*  MCV 107.2*  PLT 825   Basic Metabolic Panel: Recent Labs  Lab 02/15/19 0500  NA 137  K >7.5*  CL 98  CO2 20*  GLUCOSE 79  BUN 86*  CREATININE 10.37*  CALCIUM 7.7*   GFR: Estimated Creatinine Clearance: 11 mL/min (A) (by C-G formula based on SCr of 10.37 mg/dL (H)). Liver Function Tests: No results for input(s): AST, ALT, ALKPHOS, BILITOT, PROT, ALBUMIN in the last 168 hours. No results for input(s): LIPASE, AMYLASE in the last 168 hours. No results for input(s): AMMONIA in the last 168 hours. Coagulation Profile: No results for input(s): INR, PROTIME  in the last 168 hours. Cardiac Enzymes: No results for input(s): CKTOTAL, CKMB, CKMBINDEX, TROPONINI in the last 168 hours. BNP (last 3 results) No results for input(s): PROBNP in the last 8760 hours. HbA1C: No results for input(s): HGBA1C in the last 72 hours. CBG: Recent Labs  Lab 02/15/19 0624  GLUCAP 80   Lipid Profile: No results for input(s): CHOL, HDL, LDLCALC, TRIG, CHOLHDL, LDLDIRECT in the last 72 hours. Thyroid Function Tests: No results for input(s): TSH, T4TOTAL, FREET4, T3FREE, THYROIDAB in the last 72 hours. Anemia Panel: No results for input(s): VITAMINB12, FOLATE, FERRITIN, TIBC, IRON, RETICCTPCT in the last 72 hours. Urine analysis: No results found for: COLORURINE, APPEARANCEUR, LABSPEC, [place], GLUCOSEU, HGBUR, BILIRUBINUR, KETONESUR, PROTEINUR, UROBILINOGEN, NITRITE, LEUKOCYTESUR  Radiological Exams on Admission: DG Chest Portable 1 View  Result Date: 02/15/2019 CLINICAL DATA:  Shortness of breath, missed dialysis Wednesday EXAM: PORTABLE CHEST 1 VIEW COMPARISON:  Radiograph 11/02/2018 FINDINGS: Bilateral hazy opacities are present in both lungs with some indistinctness of the pulmonary vascularity. No pneumothorax or effusion. Stable  cardiomegaly. No acute osseous or soft tissue abnormality. IMPRESSION: 1. Bilateral hazy opacities in both lungs with some indistinctness of the pulmonary vascularity, consistent with pulmonary edema though atypical infection could have a similar appearance. 2. Stable cardiomegaly. Electronically Signed   By: Lovena Le M.D.   On: 02/15/2019 05:18    EKG: Independently reviewed.  Peaked T waves seen  Assessment/Plan Principal Problem:   Hyperkalemia Active Problems:   DM2 (diabetes mellitus, type 2) (HCC)   HLD (hyperlipidemia)   S/P below knee amputation, right (HCC)   Anemia due to end stage renal disease (HCC)   Weakness   Acute pulmonary edema (HCC)   ESRD on hemodialysis (HCC)   CHF (congestive heart failure) (HCC)   Neuropathy   Peripheral vascular disease (HCC)   Abnormal EKG   1. Hyperkalemia-severe elevation in potassium has been treated medically and now he is being started on hemodialysis urgently in the emergency department.  The dialysis nurse is present. 2. Volume overload and pulmonary edema-volume should be managed with hemodialysis treatment. 3. Diabetes mellitus type 2-I doubt that he is going to require additional insulin but we have placed a very sensitive sliding scale and CBG testing in case he does have some hyperglycemia.  His A1c is pending. 4. Mild acute congestive systolic heart failure-volume will be treated with hemodialysis as noted above. 5. Abnormal EKG is secondary to severe hyperkalemia which is being treated with hemodialysis. 6. PVD - s/p right BKA - stable.  7. Leukocytosis - CXR suggesting possible atypical pneumonia.  8. Atypical pneumonia - doxycycline ordered x 3 days.   Critical Care Procedure Note Authorized and Performed by: Murvin Natal MD  Total Critical Care time: 55 minutes   Due to a high probability of clinically significant, life threatening deterioration, the patient required my highest level of preparedness to intervene emergently  and I personally spent this critical care time directly and personally managing the patient.  This critical care time included obtaining a history; examining the patient, pulse oximetry; ordering and review of studies; arranging urgent treatment with development of a management plan; evaluation of patient's response of treatment; frequent reassessment; and discussions with other providers.  This critical care time was performed to assess and manage the high probability of imminent and life threatening deterioration that could result in multi-organ failure.  It was exclusive of separately billable procedures and treating other patients and teaching time.    DVT prophylaxis: sq  hep  Code Status: full   Family Communication:   Disposition Plan: admit for urgent medical treatments and hemodialysis  Consults called: nephrology   Admission status: inp   Perri Aragones MD [facility] How to contact the [facility] Attending or Consulting provider [place] or covering provider during after hours [place], for this patient?  1. Check the care team in [facility] and look for a) attending/consulting [facility] provider listed and b) the [facility] team listed 2. Log into [email or web address] and use [place]'s universal password to access. If you do not have the password, please contact the hospital operator. 3. Locate the [facility] provider you are looking for under [facility] and page to a number that you can be directly reached. 4. If you still have difficulty reaching the provider, please page the [facility] (Director on Call) for the Hospitalists listed on amion for assistance.   If 7PM-7AM, please contact night-coverage [email or web address] Password [facility]  02/15/2019, 7:32 AM

## 2019-02-15 NOTE — ED Provider Notes (Signed)
Emergency Department Provider Note   I have reviewed the triage vital signs and the nursing notes.   HISTORY  Chief Complaint Shortness of Breath   HPI Brendan Campbell is a 48 y.o. male with end-stage renal disease, anxiety, anemia multiple other medical problems who presents the emergency department today secondary to shortness of breath.  Patient states he missed his dialysis session on Wednesday.  He last had it on Monday.  He has had no fever or cough.  He states that the shortness of breath does not seem to be any worse with position.  States it feels similar to previous episodes where he is missed his dialysis.  He also states that he missed his dialysis because he had upset stomach with it seems to have improved.  No sick contacts.  Little bit of chest pressure but he thinks is from the shortness of breath more than anything.   No other associated or modifying symptoms.    Past Medical History:  Diagnosis Date  . Anemia   . Anxiety   . Blood transfusion without reported diagnosis   . CHF (congestive heart failure) ([place])   . Chronic kidney disease   . Depression   . Diabetes mellitus without complication ([place])   . End stage renal disease ([place])    M/W/F [facility]  . Hyperlipidemia   . Neuropathy   . Peripheral vascular disease ([place])   . PTSD (post-traumatic stress disorder)   . Wears glasses    reading    Patient Active Problem List   Diagnosis Date Noted  . Acute pulmonary edema (HCC)   . ESRD on hemodialysis ([place])   . Hyperkalemia 10/05/2018  . Weakness 10/05/2018  . Chest pain 05/10/2016  . ESRD on dialysis ([place]) 05/10/2016  . 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 Lumbar radiculopathy 03/08/2016  . S/P below knee amputation, right ([place]) 03/05/2015  . Chronic renal insufficiency, stage IV (severe) ([place]) 03/04/2015  . DM2 (diabetes mellitus, type 2) ([place]) 03/03/2015  . HLD (hyperlipidemia)  03/03/2015  . Back pain 03/03/2015    Past Surgical History:  Procedure Laterality Date  . A/V FISTULAGRAM N/A 10/09/2018   Procedure: A/V FISTULAGRAM;  Surgeon: Serafina Mitchell, MD;  Location: [place] [facility];  Service: Cardiovascular;  Laterality: N/A;  . AV FISTULA PLACEMENT Left 09/22/2016   Procedure: CREATION OF LEFT ARM ARTERIOVENOUS (AV) FISTULA;  Surgeon: Waynetta Sandy, MD;  Location: [place];  Service: Vascular;  Laterality: Left;  . AV FISTULA PLACEMENT Left 10/31/2017   Procedure: INSERTION OF ARTERIOVENOUS (AV) GORE-TEX 4-72mm STETCH GRAFT LEFT ARM;  Surgeon: Waynetta Sandy, MD;  Location: [place];  Service: Vascular;  Laterality: Left;  . BASCILIC VEIN TRANSPOSITION Left 08/17/2017   Procedure: SECOND STAGE BASILIC VEIN TRANSPOSITION LEFT ARM;  Surgeon: Waynetta Sandy, MD;  Location: [place];  Service: Vascular;  Laterality: Left;  . BELOW KNEE LEG AMPUTATION Right   . CHOLECYSTECTOMY    . FOOT SURGERY    . IR FLUORO GUIDE CV LINE RIGHT  05/16/2016  . IR REMOVAL TUN CV CATH W/O FL  05/16/2016  . IR THROMBECTOMY AV FISTULA W/THROMBOLYSIS/PTA INC/SHUNT/IMG LEFT Left 11/09/2018  . IR US GUIDE VASC ACCESS LEFT  11/09/2018  . IR US GUIDE VASC ACCESS RIGHT  05/16/2016  . PERIPHERAL VASCULAR BALLOON ANGIOPLASTY  10/09/2018   Procedure: PERIPHERAL VASCULAR BALLOON ANGIOPLASTY;  Surgeon: Serafina Mitchell, MD;  Location: [place] [facility];  Service: Cardiovascular;;    Current Outpatient Rx  . Order #: [id number] Class: Historical Med  . Order #: [id number] Class: Normal  . Order #: [id number] Class: Print  . Order #: [id number] Class: Historical Med  . Order #: [id number] Class: Historical Med  . Order #: [id number] Class: Normal    Allergies Tape  Family History  Problem Relation Age of Onset  . Cancer Mother        lung  . Diabetes Mother   . Heart attack Father   . Diabetes Father   . Diabetes Sister     Social History Social History   Tobacco Use  .  Smoking status: Former Smoker    Quit date: 09/03/2006    Years since quitting: 12.4  . Smokeless tobacco: Never Used  Substance Use Topics  . Alcohol use: No  . Drug use: No    Review of Systems  All other systems negative except as documented in the HPI. All pertinent positives and negatives as reviewed in the HPI. ____________________________________________   PHYSICAL EXAM:  VITAL SIGNS: ED Triage Vitals  Enc Vitals Group     BP 02/15/19 0426 (!) 148/92     Pulse Rate 02/15/19 0426 (!) 116     Resp 02/15/19 0426 (!) 28     Temp 02/15/19 0426 98.4 F (36.9 C)     Temp Source 02/15/19 0426 Oral     SpO2 02/15/19 0426 93 %     Weight 02/15/19 0423 230 lb (104.3 kg)     Height 02/15/19 0423 6' (1.829 m)    Constitutional: Alert and oriented. Well appearing and in no acute distress. Eyes: Conjunctivae are normal. PERRL. EOMI. Head: Atraumatic. Nose: No congestion/rhinnorhea. Mouth/Throat: Mucous membranes are moist.  Oropharynx non-erythematous. Neck: No stridor.  No meningeal signs.   Cardiovascular: Normal rate, regular rhythm. Good peripheral circulation. Grossly normal heart sounds.   Respiratory: Normal respiratory effort.  No retractions. Lungs crackles bilaterally worse on the left lower lobe. Gastrointestinal: Soft and nontender. No distention.  Musculoskeletal: No lower extremity tenderness nor edema.  Right BKA. Neurologic:  Normal speech and language. No gross focal neurologic deficits are appreciated.  Skin:  Skin is warm, dry and intact. No rash noted.   ____________________________________________   LABS (all labs ordered are listed, but only abnormal results are displayed)  Labs Reviewed  CBC WITH DIFFERENTIAL/PLATELET - Abnormal; Notable for the following components:      Result Value   WBC 14.0 (*)    RBC 3.62 (*)    Hemoglobin 12.4 (*)    HCT 38.8 (*)    MCV 107.2 (*)    MCH 34.3 (*)    Neutro Abs 12.4 (*)    All other components within  normal limits  BASIC METABOLIC PANEL - Abnormal; Notable for the following components:   Potassium >7.5 (*)    CO2 20 (*)    BUN 86 (*)    Creatinine, Ser 10.37 (*)    Calcium 7.7 (*)    GFR calc non Af Amer 5 (*)    GFR calc Af Amer 6 (*)    Anion gap 19 (*)    All other components within normal limits  BRAIN NATRIURETIC PEPTIDE - Abnormal; Notable for the following components:   B Natriuretic Peptide 3,499.0 (*)    All other components within normal limits  TROPONIN I (HIGH SENSITIVITY) - Abnormal; Notable for the following components:   Troponin I (High Sensitivity) 69 (*)  All other components within normal limits  SARS CORONAVIRUS 2 (TAT 6-24 HRS)  RESPIRATORY PANEL BY RT PCR (FLU A&B, COVID)  POC SARS CORONAVIRUS 2 AG -  ED  CBG MONITORING, ED  TROPONIN I (HIGH SENSITIVITY)   ____________________________________________  EKG   EKG Interpretation  Date/Time:  Friday February 15 2019 04:28:47 EST Ventricular Rate:  115 PR Interval:    QRS Duration: 182 QT Interval:  435 QTC Calculation: 602 R Axis:   -67 Text Interpretation: Junctional tachycardia Nonspecific IVCD with LAD wider qrs loss of p waves Confirmed by Merrily Pew [phone]) on 02/15/2019 4:32:50 AM       ____________________________________________  RADIOLOGY  DG Chest Portable 1 View  Result Date: 02/15/2019 CLINICAL DATA:  Shortness of breath, missed dialysis Wednesday EXAM: PORTABLE CHEST 1 VIEW COMPARISON:  Radiograph 11/02/2018 FINDINGS: Bilateral hazy opacities are present in both lungs with some indistinctness of the pulmonary vascularity. No pneumothorax or effusion. Stable cardiomegaly. No acute osseous or soft tissue abnormality. IMPRESSION: 1. Bilateral hazy opacities in both lungs with some indistinctness of the pulmonary vascularity, consistent with pulmonary edema though atypical infection could have a similar appearance. 2. Stable cardiomegaly. Electronically Signed   By: Lovena Le M.D.    On: 02/15/2019 05:18    ____________________________________________   PROCEDURES  Procedure(s) performed:   .Critical Care Performed by: Merrily Pew, MD Authorized by: Merrily Pew, MD   Critical care provider statement:    Critical care time (minutes):  45   Critical care was necessary to treat or prevent imminent or life-threatening deterioration of the following conditions:  Renal failure and respiratory failure   Critical care was time spent personally by me on the following activities:  Discussions with consultants, evaluation of patient's response to treatment, examination of patient, ordering and performing treatments and interventions, ordering and review of laboratory studies, ordering and review of radiographic studies, pulse oximetry, re-evaluation of patient's condition, obtaining history from patient or surrogate and review of old charts   ____________________________________________   INITIAL IMPRESSION / ASSESSMENT AND PLAN / ED COURSE  eval for fluid overload. ecg w/ loss of P waves, longer qrs than previously, will give calcium gluconate in case hyperkalemic. o2 slightly low, tachypneic, will likely need admitted for dialysis.   Patient's potassium greater than 7.5.  Insulin and the rest of hyperkalemia protocol instituted.  Discussed with Dr. Justin Mend who will set up for dialysis today at [facility].  Hospitalist paged but not heard back for admission.  Reevaluated and patient still tachypneic but comfortable.  No respiratory distress at this time.  Discussed with dr. Wynetta Emery for admission.   Pertinent labs & imaging results that were available during my care of the patient were reviewed by me and considered in my medical decision making (see chart for details).  ____________________________________________  FINAL CLINICAL IMPRESSION(S) / ED DIAGNOSES  Final diagnoses:  Hyperkalemia  Hypervolemia, unspecified hypervolemia type  ESRD needing dialysis ([place])      MEDICATIONS GIVEN DURING THIS VISIT:  Medications  insulin aspart (novoLOG) injection 10 Units (has no administration in time range)  dextrose 10 % infusion (has no administration in time range)  calcium gluconate 1 g in sodium chloride 0.9 % 100 mL IVPB (1 g Intravenous New Bag/Given 02/15/19 0643)  sodium bicarbonate injection 100 mEq (has no administration in time range)  calcium gluconate 1 g/ 50 mL sodium chloride IVPB (0 g Intravenous Stopped 02/15/19 0624)  fentaNYL (SUBLIMAZE) injection 50 mcg (50 mcg Intravenous Given 02/15/19 0617)  furosemide (LASIX) injection 80 mg (80 mg Intravenous Given 02/15/19 0619)  sodium zirconium cyclosilicate (LOKELMA) packet 10 g (10 g Oral Given 02/15/19 0628)  sodium chloride 0.9 % bolus 500 mL (0 mLs Intravenous Stopped 02/15/19 0644)     NEW OUTPATIENT MEDICATIONS STARTED DURING THIS VISIT:  New Prescriptions   No medications on file    Note:  This note was prepared with assistance of Dragon voice recognition software. Occasional wrong-word or sound-a-like substitutions may have occurred due to the inherent limitations of voice recognition software.   Damante Spragg, Corene Cornea, MD 02/15/19 [phone]

## 2019-02-15 NOTE — ED Notes (Signed)
Pt lost IV access after 1/2 amp of D50. Meds held.

## 2019-02-15 NOTE — ED Notes (Addendum)
CRITICAL VALUE ALERT  Critical Value:  Potassium 6.7  Date & Time Notied:  02/15/19 1757  Provider Notified: Dr. Wynetta Emery  Orders Received/Actions taken: MD & Primary RN notified. Paged Dr. Royce Macadamia, nephrologist, on call to notify per Dr. Wynetta Emery.

## 2019-02-15 NOTE — Procedures (Signed)
    EMERGENT HEMODIALYSIS TREATMENT NOTE:  HD performed in ED due to K>7.5.  Unfortunately, I was unable to cannulate the arterial limb of AVG.  After five unsuccessful attempts, Dr. Constance Haw kindly placed left femoral vas-cath.   HD was initiated with 0K dialysate for the first 30 minutes and 1K dialysate thereafter.  4 hour treatment completed.  Catheter tolerated average Qb of 325 ml/min.  3.4 liters removed.  UF was interrupted x 15 minutes for c/o cramping.  All blood was returned.  Pt was re-educated as to life-threatening risks of hyperkalemia.  States, "I'm not gonna miss any more treatments."  Rockwell Alexandria, RN

## 2019-02-16 ENCOUNTER — Inpatient Hospital Stay (HOSPITAL_COMMUNITY): Payer: Medicaid Other

## 2019-02-16 ENCOUNTER — Encounter (HOSPITAL_COMMUNITY): Payer: Self-pay | Admitting: Family Medicine

## 2019-02-16 DIAGNOSIS — I429 Cardiomyopathy, unspecified: Secondary | ICD-10-CM

## 2019-02-16 DIAGNOSIS — I4892 Unspecified atrial flutter: Secondary | ICD-10-CM

## 2019-02-16 HISTORY — PX: IR FLUORO GUIDE CV LINE RIGHT: IMG2283

## 2019-02-16 HISTORY — PX: IR US GUIDE VASC ACCESS RIGHT: IMG2390

## 2019-02-16 LAB — BASIC METABOLIC PANEL
Anion gap: 19 — ABNORMAL HIGH (ref 5–15)
Anion gap: 17 — ABNORMAL HIGH (ref 5–15)
BUN: 61 mg/dL — ABNORMAL HIGH (ref 6–20)
BUN: 27 mg/dL — ABNORMAL HIGH (ref 6–20)
CO2: 25 mmol/L (ref 22–32)
CO2: 22 mmol/L (ref 22–32)
Calcium: 7.6 mg/dL — ABNORMAL LOW (ref 8.9–10.3)
Calcium: 7.7 mg/dL — ABNORMAL LOW (ref 8.9–10.3)
Chloride: 93 mmol/L — ABNORMAL LOW (ref 98–111)
Chloride: 98 mmol/L (ref 98–111)
Creatinine, Ser: 8.43 mg/dL — ABNORMAL HIGH (ref 0.61–1.24)
Creatinine, Ser: 4.66 mg/dL — ABNORMAL HIGH (ref 0.61–1.24)
GFR calc Af Amer: 8 mL/min — ABNORMAL LOW (ref >60)
GFR calc Af Amer: 16 mL/min — ABNORMAL LOW (ref >60)
GFR calc non Af Amer: 7 mL/min — ABNORMAL LOW (ref >60)
GFR calc non Af Amer: 14 mL/min — ABNORMAL LOW (ref >60)
Glucose, Bld: 82 mg/dL (ref 70–99)
Glucose, Bld: 70 mg/dL (ref 70–99)
Potassium: 5.3 mmol/L — ABNORMAL HIGH (ref 3.5–5.1)
Potassium: 3.6 mmol/L (ref 3.5–5.1)
Sodium: 137 mmol/L (ref 135–145)
Sodium: 137 mmol/L (ref 135–145)

## 2019-02-16 LAB — GLUCOSE, CAPILLARY
Glucose-Capillary: 77 mg/dL (ref 70–99)
Glucose-Capillary: 56 mg/dL — ABNORMAL LOW (ref 70–99)
Glucose-Capillary: 122 mg/dL — ABNORMAL HIGH (ref 70–99)
Glucose-Capillary: 124 mg/dL — ABNORMAL HIGH (ref 70–99)

## 2019-02-16 LAB — HEPATITIS B SURFACE ANTIGEN: Hepatitis B Surface Ag: NONREACTIVE

## 2019-02-16 LAB — PROTIME-INR
INR: 1.3 — ABNORMAL HIGH (ref 0.8–1.2)
Prothrombin Time: 15.6 seconds — ABNORMAL HIGH (ref 11.4–15.2)

## 2019-02-16 LAB — CBG MONITORING, ED: Glucose-Capillary: 87 mg/dL (ref 70–99)

## 2019-02-16 LAB — HEPATITIS B CORE ANTIBODY, TOTAL: Hep B Core Total Ab: NONREACTIVE

## 2019-02-16 IMAGING — DX DG CHEST 1V PORT
1 series · 1 of 1 positions shown · non-contrast
Comparison: [DATE]

CLINICAL DATA: Hyperkalemia.  Volume overload.

EXAM:
PORTABLE CHEST 1 VIEW

[chest ap]
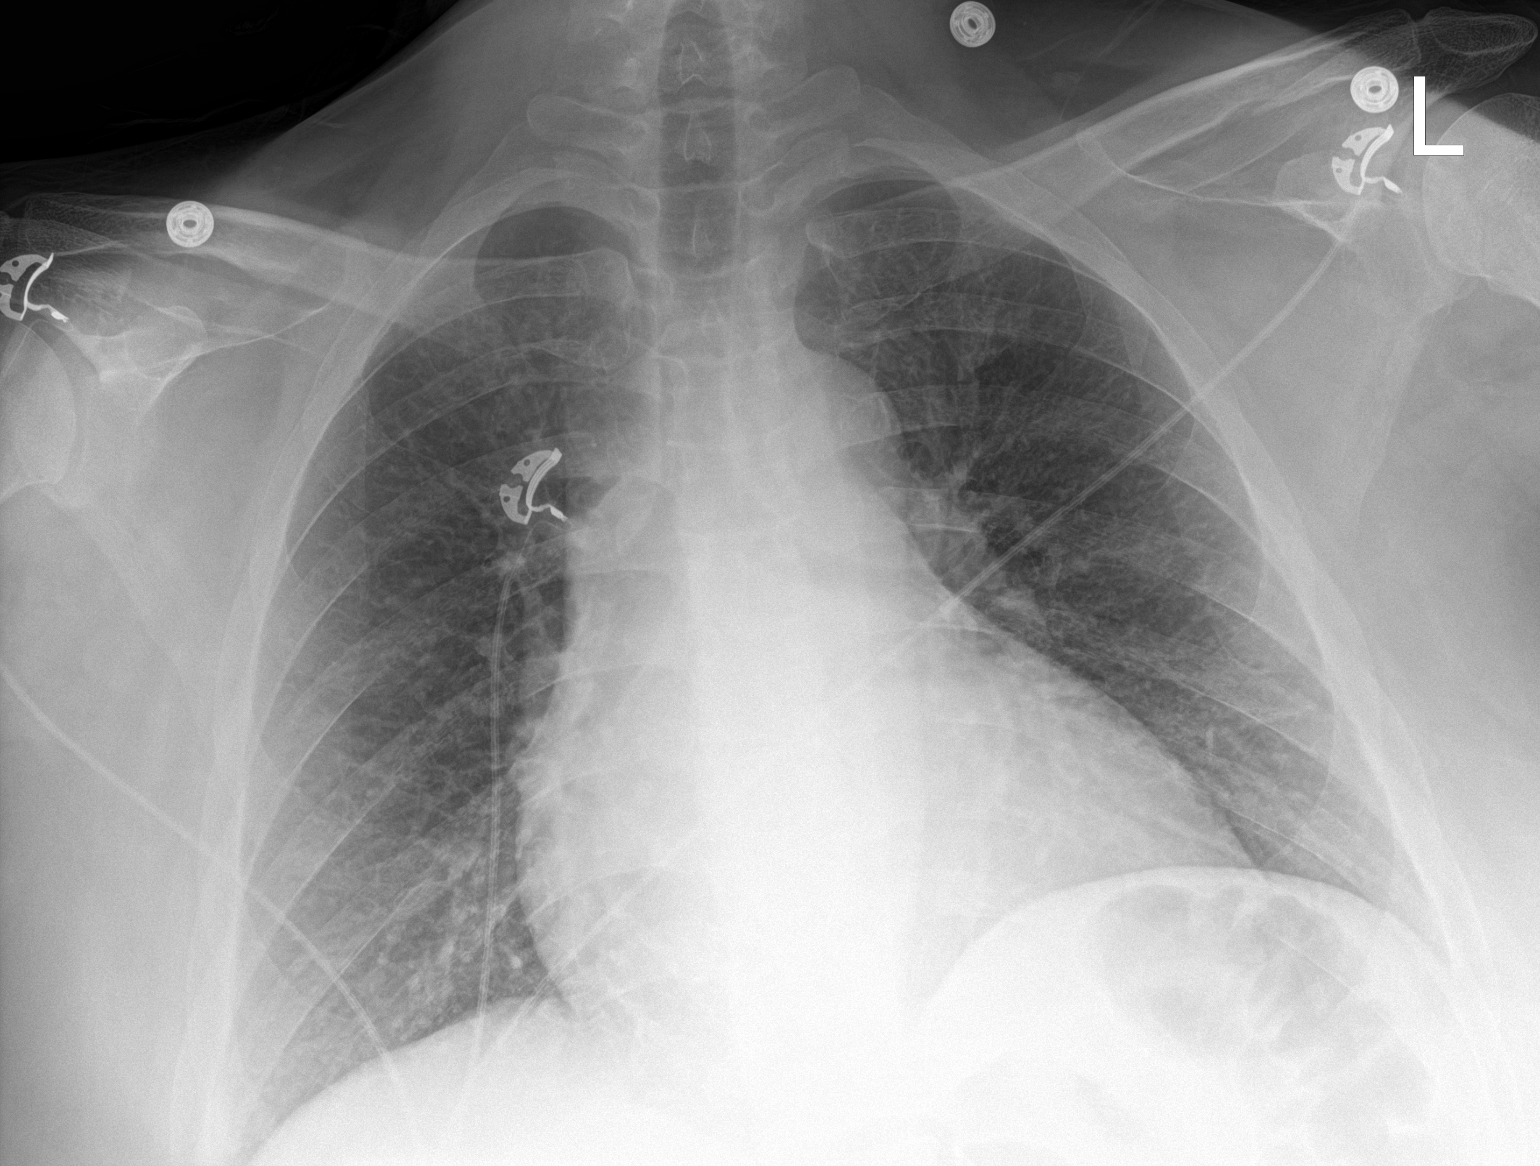

[1 of 1 positions shown; findings below may reference images not displayed]

FINDINGS: Stable cardiomegaly. The hila and mediastinum are normal. No
pneumothorax. The lungs are clear.
IMPRESSION: No active disease.

## 2019-02-16 IMAGING — US IR FLUORO GUIDE CV LINE*R*
2 series · 5 of 5 positions shown · non-contrast
Comparison: none

INDICATION: 48-year-old with end-stage renal disease and currently has a non
tunneled femoral catheter due to issues with upper extremity access.
Patient needs new hemodialysis access.

[Series 1: ir fluoro guide cv line*right* · 4 of 4 slices shown]
[im 1/4]
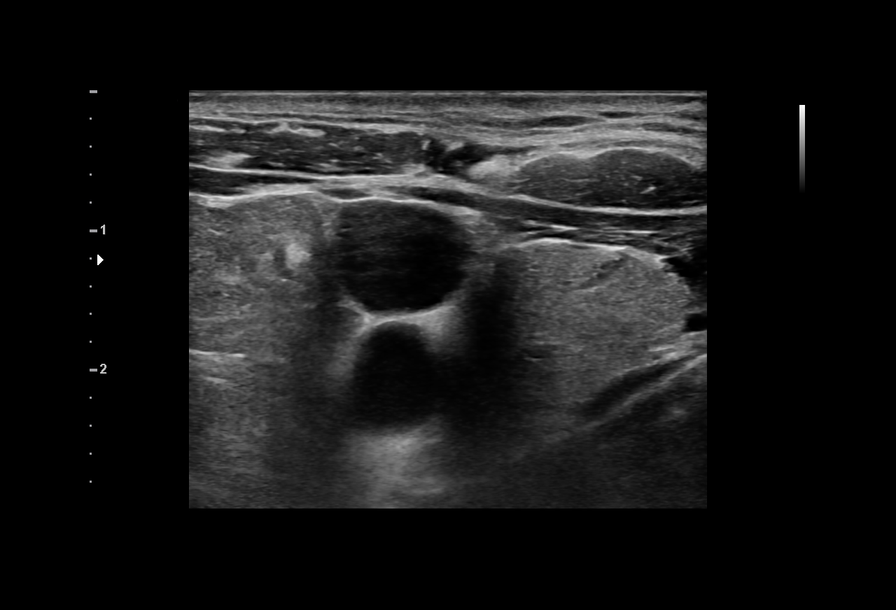
[im 2/4]
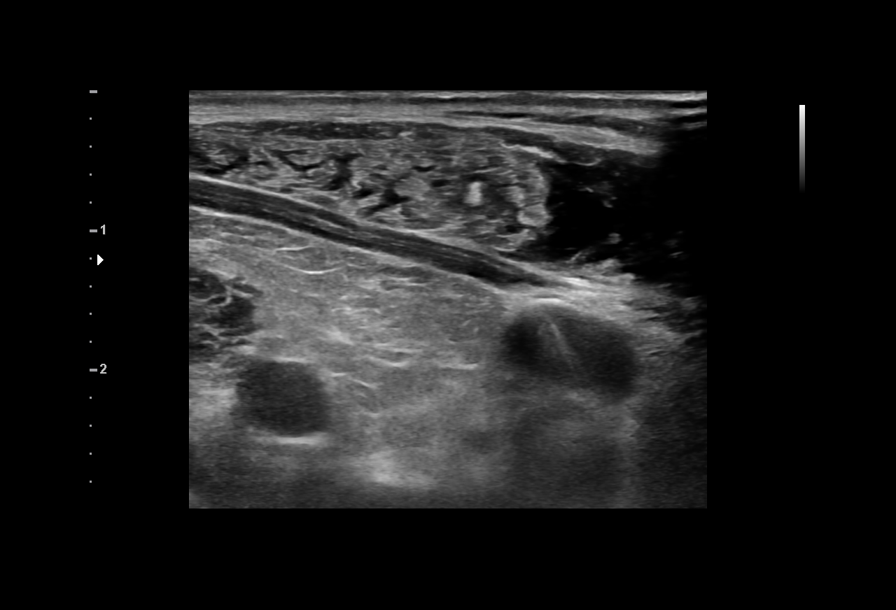
[im 3/4]
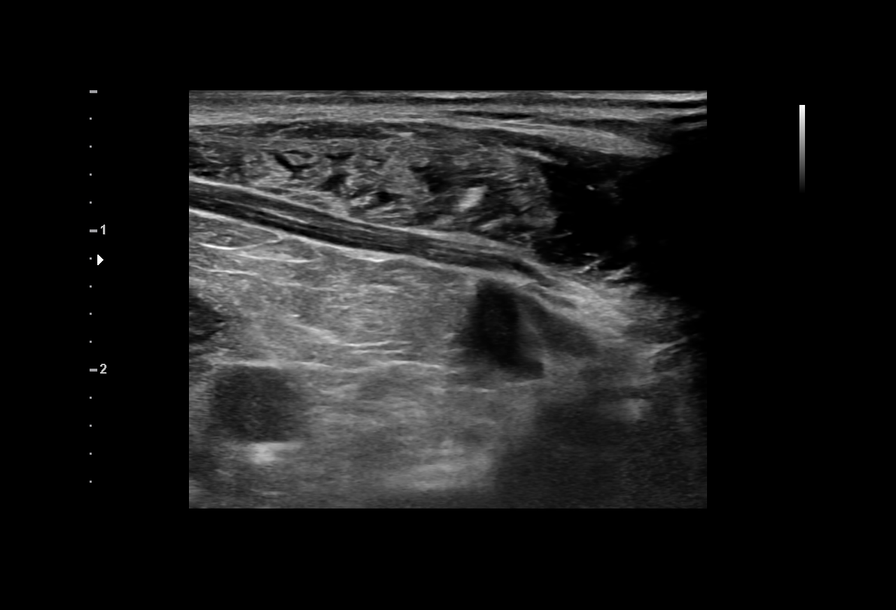
[im 4/4]
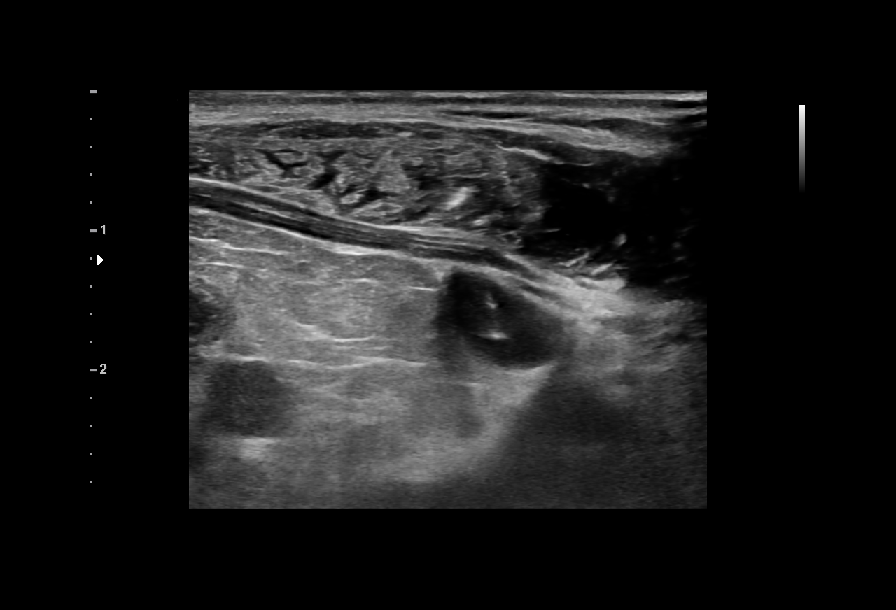

[Series 1: body 4 care · 1 of 1 slices shown]
[im 1/1]
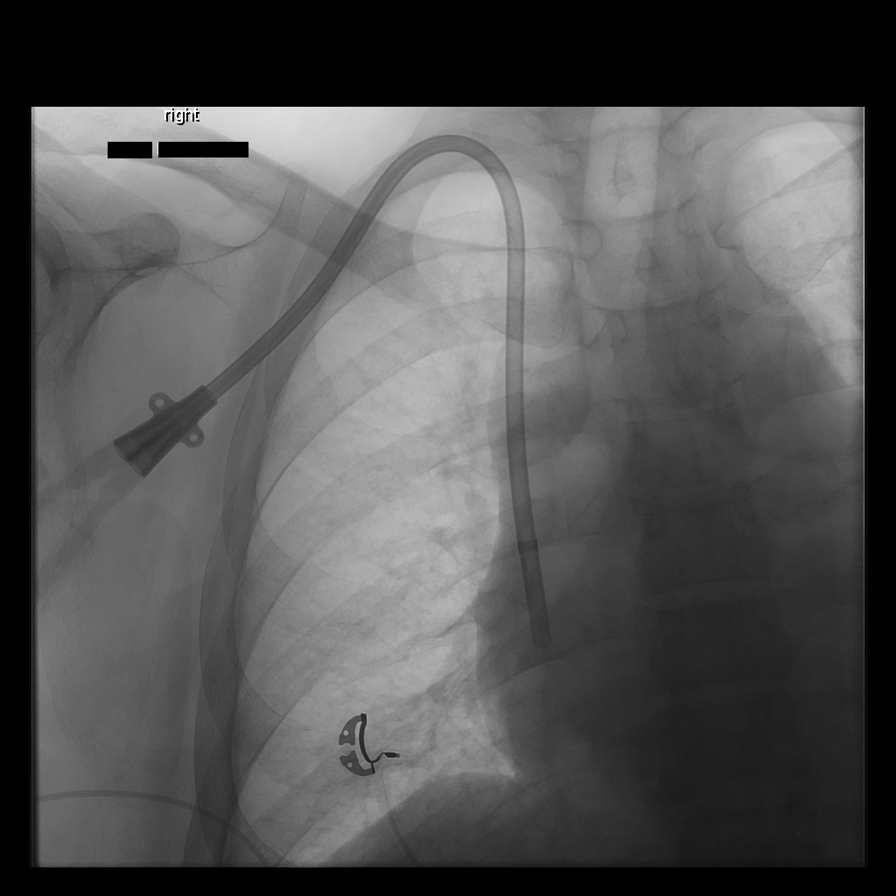

[5 of 5 positions shown; findings below may reference images not displayed]

EXAM:
FLUOROSCOPIC AND ULTRASOUND GUIDED PLACEMENT OF A TUNNELED DIALYSIS
CATHETER

MEDICATIONS:
Ancef 2 g; The antibiotic was administered within an appropriate
time interval prior to skin puncture.

ANESTHESIA/SEDATION:
Versed 1.0 mg IV; Fentanyl 125 mcg IV;

Moderate Sedation Time:  11 minutes

The patient was continuously monitored during the procedure by the
interventional radiology nurse under my direct supervision.

FLUOROSCOPY TIME:  Fluoroscopy Time: 12 seconds, 2 mGy

COMPLICATIONS:
Patient tolerated the procedure well but developed tachycardia while
sutures were being placed. Patient remained asymptomatic and blood
pressure was stable. Rapid response nurse was called to evaluate the
patient. Patient was transferred to the floor and underwent
treatment for the supraventricular tachycardia by the primary
service.

PROCEDURE:
The procedure was explained to the patient. The risks and benefits
of the procedure were discussed and the patient's questions were
addressed. Informed consent was obtained from the patient. The
patient was placed supine on the interventional table. Ultrasound
confirmed a patent right internal jugular vein. Ultrasound images
were obtained for documentation. The right neck and chest was
prepped and draped in a sterile fashion. The right neck was
anesthetized with 1% lidocaine. Maximal barrier sterile technique
was utilized including caps, mask, sterile gowns, sterile gloves,
sterile drape, hand hygiene and skin antiseptic. A small incision
was made with #11 blade scalpel. A 21 gauge needle directed into the
right internal jugular vein with ultrasound guidance. A
micropuncture dilator set was placed. A 19 cm tip to cuff Palindrome
catheter was selected. The skin below the right clavicle was
anesthetized and a small incision was made with an #11 blade
scalpel. A subcutaneous tunnel was formed to the vein dermatotomy
site. The catheter was brought through the tunnel. The vein
dermatotomy site was dilated to accommodate a peel-away sheath. The
catheter was placed through the peel-away sheath and directed into
the central venous structures. The tip of the catheter was placed at
the SVC and right atrium junction with fluoroscopy. Fluoroscopic
images were obtained for documentation. Both lumens were found to
aspirate and flush well. The proper amount of heparin was flushed in
both lumens. The vein dermatotomy site was closed using a single
layer of absorbable suture and Dermabond. The catheter was secured
to the skin using Prolene suture.
IMPRESSION: Successful placement of a right jugular tunneled dialysis catheter
using ultrasound and fluoroscopic guidance.

## 2019-02-16 MED ORDER — CEFAZOLIN SODIUM-DEXTROSE 2-4 GM/100ML-% IV SOLN
2.0000 g | INTRAVENOUS | Status: AC
  Filled 2019-02-16: qty 100

## 2019-02-16 MED ORDER — HEPARIN SODIUM (PORCINE) 1000 UNIT/ML IJ SOLN
INTRAMUSCULAR | Status: AC
  Administered 2019-02-16: 3100 [IU] via INTRAVENOUS_CENTRAL
  Filled 2019-02-16: qty 4

## 2019-02-16 MED ORDER — ADENOSINE 6 MG/2ML IV SOLN
6.0000 mg | Freq: Once | INTRAVENOUS | Status: AC
  Administered 2019-02-16: 6 mg via INTRAVENOUS
  Filled 2019-02-16: qty 2

## 2019-02-16 MED ORDER — AMIODARONE HCL IN DEXTROSE 360-4.14 MG/200ML-% IV SOLN
30.0000 mg/h | INTRAVENOUS | Status: DC
  Administered 2019-02-16 – 2019-02-20 (×7): 30 mg/h via INTRAVENOUS
  Filled 2019-02-16 (×7): qty 200

## 2019-02-16 MED ORDER — SODIUM CHLORIDE 0.9 % IV SOLN: 100.0000 mL | INTRAVENOUS | Status: DC | PRN

## 2019-02-16 MED ORDER — HEPARIN SODIUM (PORCINE) 1000 UNIT/ML IJ SOLN
INTRAMUSCULAR | Status: AC | PRN
  Administered 2019-02-16: 3.2 mL via INTRAVENOUS

## 2019-02-16 MED ORDER — HEPARIN SODIUM (PORCINE) 1000 UNIT/ML IJ SOLN
INTRAMUSCULAR | Status: AC
  Filled 2019-02-16: qty 1

## 2019-02-16 MED ORDER — AMIODARONE IV BOLUS ONLY 150 MG/100ML
150.0000 mg | Freq: Once | INTRAVENOUS | Status: AC
  Administered 2019-02-16: 150 mg via INTRAVENOUS

## 2019-02-16 MED ORDER — PENTAFLUOROPROP-TETRAFLUOROETH EX AERO: 1.0000 "application " | INHALATION_SPRAY | CUTANEOUS | Status: DC | PRN

## 2019-02-16 MED ORDER — LIDOCAINE HCL 1 % IJ SOLN
INTRAMUSCULAR | Status: AC | PRN
  Administered 2019-02-16: 20 mL

## 2019-02-16 MED ORDER — CEFAZOLIN SODIUM-DEXTROSE 2-4 GM/100ML-% IV SOLN
INTRAVENOUS | Status: AC
  Filled 2019-02-16: qty 100

## 2019-02-16 MED ORDER — LIDOCAINE HCL (PF) 1 % IJ SOLN: 5.0000 mL | INTRAMUSCULAR | Status: DC | PRN

## 2019-02-16 MED ORDER — OXYCODONE HCL 5 MG PO TABS
5.0000 mg | ORAL_TABLET | Freq: Four times a day (QID) | ORAL | Status: AC | PRN
  Administered 2019-02-16 – 2019-02-17 (×3): 5 mg via ORAL
  Filled 2019-02-16 (×3): qty 1

## 2019-02-16 MED ORDER — FENTANYL CITRATE (PF) 100 MCG/2ML IJ SOLN
INTRAMUSCULAR | Status: AC
  Filled 2019-02-16: qty 2

## 2019-02-16 MED ORDER — ALTEPLASE 2 MG IJ SOLR: 2.0000 mg | Freq: Once | INTRAMUSCULAR | Status: DC | PRN

## 2019-02-16 MED ORDER — MIDAZOLAM HCL 2 MG/2ML IJ SOLN
INTRAMUSCULAR | Status: AC | PRN
  Administered 2019-02-16: 1 mg via INTRAVENOUS

## 2019-02-16 MED ORDER — ADENOSINE 12 MG/4ML IV SOLN
12.0000 mg | Freq: Once | INTRAVENOUS | Status: AC
  Administered 2019-02-16: 12 mg via INTRAVENOUS
  Filled 2019-02-16: qty 4

## 2019-02-16 MED ORDER — LIDOCAINE HCL 1 % IJ SOLN
INTRAMUSCULAR | Status: AC
  Filled 2019-02-16: qty 20

## 2019-02-16 MED ORDER — ATORVASTATIN CALCIUM 10 MG PO TABS: 10.0000 mg | ORAL_TABLET | Freq: Every evening | ORAL | 3 refills | Status: DC

## 2019-02-16 MED ORDER — AMIODARONE HCL IN DEXTROSE 360-4.14 MG/200ML-% IV SOLN
60.0000 mg/h | INTRAVENOUS | Status: AC
  Administered 2019-02-16: 60 mg/h via INTRAVENOUS
  Filled 2019-02-16 (×2): qty 200

## 2019-02-16 MED ORDER — MORPHINE SULFATE (PF) 2 MG/ML IV SOLN
2.0000 mg | Freq: Once | INTRAVENOUS | Status: AC
  Administered 2019-02-17: 2 mg via INTRAVENOUS
  Filled 2019-02-16: qty 1

## 2019-02-16 MED ORDER — FENTANYL CITRATE (PF) 100 MCG/2ML IJ SOLN
INTRAMUSCULAR | Status: AC | PRN
  Administered 2019-02-16 (×2): 25 ug via INTRAVENOUS

## 2019-02-16 MED ORDER — ADENOSINE 6 MG/2ML IV SOLN
6.0000 mg | Freq: Once | INTRAVENOUS | Status: AC
  Administered 2019-02-16: 6 mg via INTRAVENOUS

## 2019-02-16 MED ORDER — AMIODARONE IV BOLUS ONLY 150 MG/100ML
150.0000 mg | Freq: Once | INTRAVENOUS | Status: AC
  Administered 2019-02-16: 150 mg via INTRAVENOUS
  Filled 2019-02-16: qty 100

## 2019-02-16 MED ORDER — AMIODARONE HCL IN DEXTROSE 360-4.14 MG/200ML-% IV SOLN
INTRAVENOUS | Status: AC
  Administered 2019-02-16: 15:00:00 60 mg/h via INTRAVENOUS
  Filled 2019-02-16: qty 200

## 2019-02-16 MED ORDER — AMIODARONE IV BOLUS ONLY 150 MG/100ML: 150.0000 mg | Freq: Once | INTRAVENOUS | Status: DC

## 2019-02-16 MED ORDER — MIDAZOLAM HCL 2 MG/2ML IJ SOLN
INTRAMUSCULAR | Status: AC
  Filled 2019-02-16: qty 2

## 2019-02-16 MED ORDER — SODIUM CHLORIDE 0.9 % IV BOLUS
250.0000 mL | Freq: Once | INTRAVENOUS | Status: AC
  Administered 2019-02-16: 250 mL via INTRAVENOUS

## 2019-02-16 MED ORDER — TRAMADOL HCL 50 MG PO TABS
50.0000 mg | ORAL_TABLET | Freq: Two times a day (BID) | ORAL | Status: DC | PRN
  Filled 2019-02-16: qty 1

## 2019-02-16 MED ORDER — HEPARIN SODIUM (PORCINE) 1000 UNIT/ML DIALYSIS: 1000.0000 [IU] | INTRAMUSCULAR | Status: DC | PRN

## 2019-02-16 MED ORDER — CHLORHEXIDINE GLUCONATE CLOTH 2 % EX PADS: 6.0000 | MEDICATED_PAD | Freq: Every day | CUTANEOUS | Status: DC

## 2019-02-16 MED ORDER — LIDOCAINE-PRILOCAINE 2.5-2.5 % EX CREA: 1.0000 "application " | TOPICAL_CREAM | CUTANEOUS | Status: DC | PRN

## 2019-02-16 MED ORDER — SODIUM BICARBONATE 8.4 % IV SOLN
50.0000 meq | Freq: Once | INTRAVENOUS | Status: AC
  Administered 2019-02-16: 50 meq via INTRAVENOUS
  Filled 2019-02-16: qty 50

## 2019-02-16 MED FILL — Medication: Qty: 1 | Status: AC

## 2019-02-16 NOTE — Progress Notes (Signed)
Patient states he can not take Tramadol due to his kidney problems.  Page to on call Hospitalist for additional orders.

## 2019-02-16 NOTE — Significant Event (Signed)
Rapid Response Event Note  Overview: Called d/t hypotension. Pt here for SOB after missing HD. He had a tunneled HD placed in IR today and went into Afib with RVR-140s. He received , 6mg , 6mg , and 12mg  adenosine and was started on an Amiodarone gtt with bolus. Since then he has had 2 additional boluses of amiodarone and his pressure is now low.  Time Called: 2042 Arrival Time: 2050 Event Type: Cardiac, Hypotension  Initial Focused Assessment: Pt laying in bed asleep, will awaken easily, is alert and oriented, will follow commands, and move all extremities. Pt denies SOB, chest pain, and dizziness but does c/o some neck pain. Lungs diminished but clear t/o. Skin warm and dry. T-98.4, HR-124(afib/aflutter), BP-81/61>75/59, RR-19, SpO-98% on 2L [place]. Pt had HD today and took off 1L.  D/t need for 3rd amiodarone bolus, it was originally the plan to leave pt on 60mg  amiodarone instead of dropping it down to 30.  Interventions: Cardiology paged regarding hypotension and thinks pt may benefit from some fluid-they asked RN to page [facility] for this order. Since pt is hypotensive, cardiology also said to drop amiodarone down to 30mg  per the normal amiodarone order set instead of leaving a.t 60mg .  500cc NS bolus over 2 hours per [facility] Warm packs for neck pain-morphine ordered as well if BP comes up and warm packs do not help.   Plan of Care (if not transferred): Give bolus, monitor BP, call RRT if further assistance needed.  Event Summary: Name of Physician Notified: Silas [place], NP at 2116  Name of Consulting Physician Notified: Dr. Radford Pax (Cardiology) at (PTA RRT)  Outcome: Stayed in room and stabalized     [place], Carren Rang

## 2019-02-16 NOTE — Progress Notes (Addendum)
PROGRESS NOTE  Brendan Campbell [id number] DOB: [birth year] DOA: 02/15/2019 PCP: Patient, No Pcp Per  Brief summary:  Hyperkalemia with EKG changes, volume overload, acute on chronic systolic heart failure due to noncompliance with dialysis.  He underwent urgent dialysis Status post tunneled catheter placement by IR on 1/9th Postprocedure he developed SVT with stable blood pressure   HPI/Recap of past 24 hours:  He developed SVT after tunneled cath placement His blood pressure is currently stable, he denies feeling dizzy or chest pain,   Assessment/Plan: Principal Problem:   Hyperkalemia Active Problems:   DM2 (diabetes mellitus, type 2) (HCC)   HLD (hyperlipidemia)   S/P below knee amputation, right (HCC)   Anemia due to end stage renal disease (HCC)   Weakness   Acute pulmonary edema (HCC)   ESRD on hemodialysis (HCC)   CHF (congestive heart failure) (HCC)   Neuropathy   Peripheral vascular disease (HCC)   Abnormal EKG   Leukocytosis   Atypical pneumonia  SVT, developed after tunneled catheter placement, planned discharge cancelled -he does Not appear to be symptomatic, systolic blood pressure in the 110 while having heart rate ranged from 150-160 .he received 250 cc bolusx1 post procedure -SVT persists after adenosine 6mg  x2, he had a brief a flutter wave lasted about 12-second then went back to SVT after 12 mg adenosinex1 administration -SVT persists after amiodarone 150 mg IV x1, SVT  terminated after amiodarone infusion for about 10 minutes. -cardiology consulted, back to aflutter on amiodarone drip, amiodarone 150bolusx1 ordered per cardiology recommendation, appreciate cardiology consult, will follow recommendation.  Hyperkalemia with EKG changes, volume overload, acute on chronic systolic heart failure due to noncompliance with dialysis. -Patient presented to the emergency room on 1/8 at 4am via EMS due to short of breath chest pain, report "I just did not feel good  " -per nephrology "He chronically skips his Wednesday dialysis despite multiple admissions for pulmonary edema/volume overload and hyperkalemia " -On presentation ,his K was >7.5, EKG has widened QRS, and bradycardia , CXR with pulmonary edema.   -High-sensitivity troponin is not consistent with ACS -His peripheral IV was lost so he did not receive the ordered Calcium/insulin/D50 by reports -He required urgent dialysis, symptom resolved -Nephrology input appreciated  AVG malfunction: - due to not able to cannulate left follow-up for extremity AV graft, a femoral temporary HD catheter was placed in the ED for urgent dialysis -IR consulted for tunneled dialysis catheter placement today -Patient will need outpatient shuntogram to be arranged by nephrology   ESRD On HD MWF/Anemia of chronic disease Oliguria Follow-up with nephrology  Acute on chronic systolic heart failure /pulmonary edema Left ventricular EF 25 to 30% on diet echocardiogram obtained in September 2020 Presents with pulmonary edema, resolved after urgent dialysis, repeat chest x-ray this morning is clear   Insulin-dependent type 2 diabetes, fairly controlled -Continue home dose insulin -Follow-up with PCP  Hyperlipidemia Report run out statin, new prescription provided at discharge Advised patient to follow-up with his PCP  PVD Status post right BKA -Report baseline independent, lives with a roommate.    Procedures: a femoral temporary HD catheter placed in the ED on January 8  Urgent dialysis  Tunneled dialysis catheter placement by IR on January 9  Consultations:  Nephrology  IR  cardiology   DVT Prophylaxis: Heparin  Code Status: Full  Family Communication: patient   Disposition Plan: Plan to discharge canceled due to SVT, need cardiology clearance prior to discharge     Objective:  BP 105/73   Pulse 90   Temp 98.1 F (36.7 C) (Oral)   Resp 12   Ht 6' (1.829 m)   Wt 85.3  kg   SpO2 100%   BMI 25.50 kg/m   Intake/Output Summary (Last 24 hours) at 02/16/2019 1433 Last data filed at 02/16/2019 1111 Gross per 24 hour  Intake --  Output 4344 ml  Net -4344 ml   Filed Weights   02/15/19 1030 02/16/19 0800 02/16/19 1111  Weight: 104.3 kg 87.8 kg 85.3 kg    Exam: Patient is examined daily including today on 02/16/2019, exams remain the same as of yesterday except that has changed    General:  NAD, + right-sided tunneled catheter  Cardiovascular: Tachycardia  Respiratory: CTABL  Abdomen: Soft/ND/NT, positive BS  Musculoskeletal: No Edema, right BKA  Neuro: alert, oriented   Data Reviewed: Basic Metabolic Panel: Recent Labs  Lab 02/15/19 0500 02/15/19 1701 02/15/19 1826 02/16/19 0525  NA 137 134* 133* 137  K >7.5* 6.7* 6.9* 5.3*  CL 98 93* 94* 93*  CO2 20* 26 24 25   GLUCOSE 79 128* 105* 82  BUN 86* 51* 52* 61*  CREATININE 10.37* 7.28* 7.21* 8.43*  CALCIUM 7.7* 8.3* 7.9* 7.6*  PHOS  --  6.3*  --   --    Liver Function Tests: Recent Labs  Lab 02/15/19 1701  ALBUMIN 3.8   No results for input(s): LIPASE, AMYLASE in the last 168 hours. No results for input(s): AMMONIA in the last 168 hours. CBC: Recent Labs  Lab 02/15/19 0500  WBC 14.0*  NEUTROABS 12.4*  HGB 12.4*  HCT 38.8*  MCV 107.2*  PLT 274   Cardiac Enzymes:   No results for input(s): CKTOTAL, CKMB, CKMBINDEX, TROPONINI in the last 168 hours. BNP (last 3 results) Recent Labs    02/15/19 0500  BNP 3,499.0*    ProBNP (last 3 results) No results for input(s): PROBNP in the last 8760 hours.  CBG: Recent Labs  Lab 02/15/19 0624 02/15/19 1517 02/15/19 1756 02/16/19 0115 02/16/19 0657  GLUCAP 80 80 110* 87 77    Recent Results (from the past 240 hour(s))  SARS CORONAVIRUS 2 (TAT 6-24 HRS) Nasopharyngeal Nasopharyngeal Swab     Status: None   Collection Time: 02/15/19  4:30 AM   Specimen: Nasopharyngeal Swab  Result Value Ref Range Status   SARS Coronavirus 2  NEGATIVE NEGATIVE Final    Comment: (NOTE) SARS-CoV-2 target nucleic acids are NOT DETECTED. The SARS-CoV-2 RNA is generally detectable in upper and lower respiratory specimens during the acute phase of infection. Negative results do not preclude SARS-CoV-2 infection, do not rule out co-infections with other pathogens, and should not be used as the sole basis for treatment or other patient management decisions. Negative results must be combined with clinical observations, patient history, and epidemiological information. The expected result is Negative. Fact Sheet for Patients: SugarRoll.be Fact Sheet for Healthcare Providers: https://www.woods-mathews.com/ This test is not yet approved or cleared by the [place] FDA and  has been authorized for detection and/or diagnosis of SARS-CoV-2 by FDA under an Emergency Use Authorization (EUA). This EUA will remain  in effect (meaning this test can be used) for the duration of the COVID-19 declaration under Section 56 4(b)(1) of the Act, 21 U.S.C. section 360bbb-3(b)(1), unless the authorization is terminated or revoked sooner. Performed at [facility], [place] [address], [place], [place] [address]   Respiratory Panel by RT PCR (Flu A&B, Covid) - Nasopharyngeal Swab  Status: None   Collection Time: 02/15/19  6:05 AM   Specimen: Nasopharyngeal Swab  Result Value Ref Range Status   SARS Coronavirus 2 by RT PCR NEGATIVE NEGATIVE Final    Comment: (NOTE) SARS-CoV-2 target nucleic acids are NOT DETECTED. The SARS-CoV-2 RNA is generally detectable in upper respiratoy specimens during the acute phase of infection. The lowest concentration of SARS-CoV-2 viral copies this assay can detect is 131 copies/mL. A negative result does not preclude SARS-Cov-2 infection and should not be used as the sole basis for treatment or other patient management decisions. A negative result may occur with    improper specimen collection/handling, submission of specimen other than nasopharyngeal swab, presence of viral mutation(s) within the areas targeted by this assay, and inadequate number of viral copies (<131 copies/mL). A negative result must be combined with clinical observations, patient history, and epidemiological information. The expected result is Negative. Fact Sheet for Patients:  PinkCheek.be Fact Sheet for Healthcare Providers:  GravelBags.it This test is not yet ap proved or cleared by the [place] FDA and  has been authorized for detection and/or diagnosis of SARS-CoV-2 by FDA under an Emergency Use Authorization (EUA). This EUA will remain  in effect (meaning this test can be used) for the duration of the COVID-19 declaration under Section 564(b)(1) of the Act, 21 U.S.C. section 360bbb-3(b)(1), unless the authorization is terminated or revoked sooner.    Influenza A by PCR NEGATIVE NEGATIVE Final   Influenza B by PCR NEGATIVE NEGATIVE Final    Comment: (NOTE) The Xpert Xpress SARS-CoV-2/FLU/RSV assay is intended as an aid in  the diagnosis of influenza from Nasopharyngeal swab specimens and  should not be used as a sole basis for treatment. Nasal washings and  aspirates are unacceptable for Xpert Xpress SARS-CoV-2/FLU/RSV  testing. Fact Sheet for Patients: PinkCheek.be Fact Sheet for Healthcare Providers: GravelBags.it This test is not yet approved or cleared by the [place] FDA and  has been authorized for detection and/or diagnosis of SARS-CoV-2 by  FDA under an Emergency Use Authorization (EUA). This EUA will remain  in effect (meaning this test can be used) for the duration of the  Covid-19 declaration under Section 564(b)(1) of the Act, 21  U.S.C. section 360bbb-3(b)(1), unless the authorization is  terminated or revoked. Performed at  [facility], [address] [place], [place] [address]      Studies: DG Chest [facility] 1 View  Result Date: 02/16/2019 CLINICAL DATA:  Hyperkalemia.  Volume overload. EXAM: PORTABLE CHEST 1 VIEW COMPARISON:  February 15, 2019 FINDINGS: Stable cardiomegaly. The hila and mediastinum are normal. No pneumothorax. The lungs are clear. IMPRESSION: No active disease. Electronically Signed   By: Dorise Bullion III M.D   On: 02/16/2019 11:44    Scheduled Meds: . heparin      . adenosine (ADENOCARD) IV  6 mg Intravenous Once  . aspirin  81 mg Oral Daily  . atorvastatin  10 mg Oral QPM  . Chlorhexidine Gluconate Cloth  6 each Topical Q0600  . doxycycline  100 mg Oral Q12H  . fentaNYL      . heparin  5,000 Units Subcutaneous Q8H  . insulin aspart  0-6 Units Subcutaneous TID WC  . lidocaine      . midazolam      . multivitamin  1 tablet Oral Daily  . pantoprazole  40 mg Oral Q0600  . sodium bicarbonate  100 mEq Intravenous Once    Continuous Infusions: . ceFAZolin    .  ceFAZolin (ANCEF) IV      Total Critical Care time in examining the patient bedside, evaluating Lab work and other data, over half of the total time was spent in coordinating patient care on the floor or bedisde in talking to patient/family members, communicating with nursing Staff on the floor and sub specialists interventional radiology, nephrology, cardiology to coordinate patients medical care and needs is 70 minutes.  The condition which has caused critical injury/acute impairment of vital organ system with a high probability of sudden clinically significant deterioration and can cause Potential Life threatening injury to this patient addressed today is hyperkalemia, SVT    Florencia Reasons MD, PhD, FACP  [facility] Pager [phone]. If 7PM-7AM, please contact night-coverage at [email or web address], password [facility] 02/16/2019, 2:33 PM  LOS: 1 day

## 2019-02-16 NOTE — Procedures (Signed)
Interventional Radiology Procedure:   Indications: Needs new hemodialysis access  Procedure: Tunneled dialysis catheter placement  Findings: Right jugular Palindrome placed, tip at SVC/RA junction  Complications: Asymptomatic SVT following catheter placement,  HR 140s.  Rapid respond nurse came and evaluated patient.  Patient's BP was stable and patient transferred back to inpatient room with rapid response nurse.     EBL: less than 10 ml  Plan: Catheter is ready to use.   Lis Savitt R. Anselm Pancoast, MD  Pager: [phone]

## 2019-02-16 NOTE — Progress Notes (Signed)
[facility] Progress Note   Subjective:  Seen on HD today - no dyspnea or CP. Was transferred from [facility] yesterday d/t persistent hyperkalemia after HD. Today, K improved. Has L femoral temp HD line in place - on HD now. Will need permanent TDC prior to discharge and then have his AVF addressed as outpatient.  Objective Vitals:   02/15/19 2345 02/16/19 0000 02/16/19 0211 02/16/19 0408  BP: 103/62 109/69 128/76 99/69  Pulse: 85 83 85 83  Resp: 15 17 18 20   Temp:   99 F (37.2 C) 98.4 F (36.9 C)  TempSrc:   Oral Oral  SpO2: 100% 100% 97% 96%  Weight:      Height:   6' (1.829 m)    Physical Exam General: Well appearing, NAD Heart: RRR; no murmur Lungs: CTA anteriorly Abdomen: soft, non-tender Extremities: No LE edema Dialysis Access: LUE AVF + bruit (concern for dysfunction), temp L fem HD line  Additional Objective Labs: Basic Metabolic Panel: Recent Labs  Lab 02/15/19 1701 02/15/19 1826 02/16/19 0525  NA 134* 133* 137  K 6.7* 6.9* 5.3*  CL 93* 94* 93*  CO2 26 24 25   GLUCOSE 128* 105* 82  BUN 51* 52* 61*  CREATININE 7.28* 7.21* 8.43*  CALCIUM 8.3* 7.9* 7.6*  PHOS 6.3*  --   --    Liver Function Tests: Recent Labs  Lab 02/15/19 1701  ALBUMIN 3.8   CBC: Recent Labs  Lab 02/15/19 0500  WBC 14.0*  NEUTROABS 12.4*  HGB 12.4*  HCT 38.8*  MCV 107.2*  PLT 274   Studies/Results: DG Chest Portable 1 View  Result Date: 02/15/2019 CLINICAL DATA:  Shortness of breath, missed dialysis Wednesday EXAM: PORTABLE CHEST 1 VIEW COMPARISON:  Radiograph 11/02/2018 FINDINGS: Bilateral hazy opacities are present in both lungs with some indistinctness of the pulmonary vascularity. No pneumothorax or effusion. Stable cardiomegaly. No acute osseous or soft tissue abnormality. IMPRESSION: 1. Bilateral hazy opacities in both lungs with some indistinctness of the pulmonary vascularity, consistent with pulmonary edema though atypical infection could have a similar  appearance. 2. Stable cardiomegaly. Electronically Signed   By: Lovena Le M.D.   On: 02/15/2019 05:18   Medications: . sodium chloride    . sodium chloride     . aspirin  81 mg Oral Daily  . atorvastatin  10 mg Oral QPM  . Chlorhexidine Gluconate Cloth  6 each Topical Q0600  . doxycycline  100 mg Oral Q12H  . heparin  5,000 Units Subcutaneous Q8H  . insulin aspart  0-6 Units Subcutaneous TID WC  . multivitamin  1 tablet Oral Daily  . pantoprazole  40 mg Oral Q0600  . sodium bicarbonate  100 mEq Intravenous Once    Dialysis Orders: [facility]  on MWF . EDW 87.5kg and prosthetic weighs 1.6 kg HD Bath 1K/2.25Ca  Time 4:15 Heparin 1,000 units bolus then 500 units/hr. Access LUE AVG BFR 300 DFR 600    Hectoral 0.5 mcg IV/HD Epogen 600   Units IV/HD     Assessment/Plan: 1.  Hyperkalemia and volume overload: Due to noncompliance with HD.  S/p urgent HD with low K bath at [facility]. Unfortunately, K remained high - transferred to [facility] for [facility] evaluation. 2. Vascular access: Has LUE AVG with ongoing cannulation issues - issue for some time with last intervention was thrombectomy in October 2020. Dr. Constance Haw was consulted at [facility] and kindly placed a femoral temporary HD catheter in the ED. K improving now s/p 2nd HD.  Have consulted IR to place tunneled HD line today - likely can be discharged afterwards and will need further follow-up as outpatient to see if LUE AVG can be salvaged. 3.  ESRD: Repeat HD today d/t persistent hyperK, now improving. 4.  Hypertension/volume: BP stable, edmea and dyspnea resolved. 5.  Anemia: Hgb > 12, no ESA needed. 6.  Metabolic bone disease -  Resume home meds and renal diet 7.  Nutrition -  Renal diet   Veneta Penton, PA-C 02/16/2019, 8:34 AM  [facility] Pager: [phone]

## 2019-02-16 NOTE — Progress Notes (Signed)
Brendan Campbell transferred to HD at 0730.

## 2019-02-16 NOTE — Progress Notes (Signed)
Page to Dr. Radford Pax with update on Afib with rvr and recent treatments. Updated on VS.  Orders received.

## 2019-02-16 NOTE — Consult Note (Signed)
CARDIOLOGY CONSULT NOTE  Patient ID: Brendan Campbell MRN: [id number] DOB/AGE: Dec 16, 1970 49 y.o.  Admit date: 02/15/2019 Primary Physician Patient, No Pcp Per Primary Cardiologist Dr. Harl Bowie Chief Complaint  Atrial flutter Requesting  Dr. Florencia Reasons   HPI:   He presented with SOB after missing dialysis.  He was noted on admission to have a potassium of 7.5.  He had urgent dialysis and was treated with IV calicum.    He had a hemodialysis catheter placed today and developed tachycardia.  He was treated with adenosine which demonstrated atrial flutter.  He did get  IV amiodarone and converted to NSR.   Echo in September demonstrates an EF of 25 - 30%.  He has previously been seen by Dr. Harl Bowie who was trying to get outside records and the patient recalls a history of heart failure.  I do see an old echocardiogram.  This was from March of this year and his EF was 15 to 20%.  He does not recall any work-up of this.  I am unable to access the note from [facility].  He has had poorly controlled diabetes for years.  He has never had any palpitations, presyncope or syncope.  He did not notice when he was out of rhythm earlier.  He does not describe any chest pressure, neck or arm discomfort.  He does have shortness of breath episodically.  He did miss 1 dialysis which he thinks started his shortness of breath.   Past Medical History:  Diagnosis Date  . Anemia   . Anxiety   . Blood transfusion without reported diagnosis   . CHF (congestive heart failure) ([place])   . Chronic kidney disease   . Depression   . Diabetes mellitus without complication ([place])   . End stage renal disease ([place])    M/W/F [facility]  . Hyperlipidemia   . Neuropathy   . Peripheral vascular disease ([place])   . PTSD (post-traumatic stress disorder)   . Wears glasses    reading    Past Surgical History:  Procedure Laterality Date  . A/V FISTULAGRAM N/A 10/09/2018   Procedure: A/V FISTULAGRAM;  Surgeon: Serafina Mitchell, MD;   Location: [place] [facility];  Service: Cardiovascular;  Laterality: N/A;  . AV FISTULA PLACEMENT Left 09/22/2016   Procedure: CREATION OF LEFT ARM ARTERIOVENOUS (AV) FISTULA;  Surgeon: Waynetta Sandy, MD;  Location: [place];  Service: Vascular;  Laterality: Left;  . AV FISTULA PLACEMENT Left 10/31/2017   Procedure: INSERTION OF ARTERIOVENOUS (AV) GORE-TEX 4-67mm STETCH GRAFT LEFT ARM;  Surgeon: Waynetta Sandy, MD;  Location: [place];  Service: Vascular;  Laterality: Left;  . BASCILIC VEIN TRANSPOSITION Left 08/17/2017   Procedure: SECOND STAGE BASILIC VEIN TRANSPOSITION LEFT ARM;  Surgeon: Waynetta Sandy, MD;  Location: [place];  Service: Vascular;  Laterality: Left;  . BELOW KNEE LEG AMPUTATION Right   . CHOLECYSTECTOMY    . FOOT SURGERY    . IR FLUORO GUIDE CV LINE RIGHT  05/16/2016  . IR REMOVAL TUN CV CATH W/O FL  05/16/2016  . IR THROMBECTOMY AV FISTULA W/THROMBOLYSIS/PTA INC/SHUNT/IMG LEFT Left 11/09/2018  . IR US GUIDE VASC ACCESS LEFT  11/09/2018  . IR US GUIDE VASC ACCESS RIGHT  05/16/2016  . PERIPHERAL VASCULAR BALLOON ANGIOPLASTY  10/09/2018   Procedure: PERIPHERAL VASCULAR BALLOON ANGIOPLASTY;  Surgeon: Serafina Mitchell, MD;  Location: [place] [facility];  Service: Cardiovascular;;    Allergies  Allergen Reactions  . Tape Other (See  Comments)    Pulls skin off   Medications Prior to Admission  Medication Sig Dispense Refill Last Dose  . aspirin 81 MG chewable tablet Chew 81 mg by mouth daily.   02/14/2019 at Unknown time  . LANTUS SOLOSTAR 100 UNIT/ML Solostar Pen Inject 10 Units into the skin at bedtime.    02/14/2019 at Unknown time  . multivitamin (RENA-VIT) TABS tablet Take 1 tablet by mouth daily.   02/14/2019 at Unknown time   Family History  Problem Relation Age of Onset  . Cancer Mother        lung  . Diabetes Mother   . Heart attack Father   . Diabetes Father   . Diabetes Sister     Social History   Socioeconomic History  . Marital status: Single      Spouse name: Not on file  . Number of children: Not on file  . Years of education: Not on file  . Highest education level: Not on file  Occupational History  . Not on file  Tobacco Use  . Smoking status: Former Smoker    Quit date: 09/03/2006    Years since quitting: 12.4  . Smokeless tobacco: Never Used  Substance and Sexual Activity  . Alcohol use: No  . Drug use: No  . Sexual activity: Not on file  Other Topics Concern  . Not on file  Social History Narrative  . Not on file   Social Determinants of Health   Financial Resource Strain:   . Difficulty of Paying Living Expenses: Not on file  Food Insecurity:   . Worried About Charity fundraiser in the Last Year: Not on file  . Ran Out of Food in the Last Year: Not on file  Transportation Needs:   . Lack of Transportation (Medical): Not on file  . Lack of Transportation (Non-Medical): Not on file  Physical Activity:   . Days of Exercise per Week: Not on file  . Minutes of Exercise per Session: Not on file  Stress:   . Feeling of Stress : Not on file  Social Connections:   . Frequency of Communication with Friends and Family: Not on file  . Frequency of Social Gatherings with Friends and Family: Not on file  . Attends Religious Services: Not on file  . Active Member of Clubs or Organizations: Not on file  . Attends Archivist Meetings: Not on file  . Marital Status: Not on file  Intimate Partner Violence:   . Fear of Current or Ex-Partner: Not on file  . Emotionally Abused: Not on file  . Physically Abused: Not on file  . Sexually Abused: Not on file     ROS:    As stated in the HPI and negative for all other systems.  Physical Exam: Blood pressure 129/72, pulse 87, temperature 98.1 F (36.7 C), temperature source Oral, resp. rate 17, height 6' (1.829 m), weight 85.3 kg, SpO2 100 %.  GENERAL:  Well appearing HEENT:  Pupils equal round and reactive, fundi not visualized, oral mucosa unremarkable NECK:   No jugular venous distention, waveform within normal limits, carotid upstroke brisk and symmetric, no bruits, no thyromegaly LYMPHATICS:  No cervical, inguinal adenopathy LUNGS:  Clear to auscultation bilaterally BACK:  No CVA tenderness CHEST:  Unremarkable HEART:  PMI not displaced or sustained,S1 and S2 within normal limits, no S3, no S4, no clicks, no rubs, no murmurs ABD:  Flat, positive bowel sounds normal in frequency in pitch, no  bruits, no rebound, no guarding, no midline pulsatile mass, no hepatomegaly, no splenomegaly EXT:  2 plus pulses 2+ upper pulses, no edema, no cyanosis no clubbing, right BKA, left upper arm dialysis fistula pulsatile but no thrill or bruit SKIN:  No rashes no nodules NEURO:  Cranial nerves II through XII grossly intact, motor grossly intact throughout PSYCH:  Cognitively intact, oriented to person place and time   Labs: Lab Results  Component Value Date   BUN 27 (H) 02/16/2019   Lab Results  Component Value Date   CREATININE 4.66 (H) 02/16/2019   Lab Results  Component Value Date   NA 137 02/16/2019   K 3.6 02/16/2019   CL 98 02/16/2019   CO2 22 02/16/2019   Lab Results  Component Value Date   TROPONINI 0.06 (HH) 05/10/2016   Lab Results  Component Value Date   WBC 14.0 (H) 02/15/2019   HGB 12.4 (L) 02/15/2019   HCT 38.8 (L) 02/15/2019   MCV 107.2 (H) 02/15/2019   PLT 274 02/15/2019   Lab Results  Component Value Date   CHOL 139 05/10/2016   HDL 40 (L) 05/10/2016   LDLCALC 72 05/10/2016   TRIG 137 05/10/2016   CHOLHDL 3.5 05/10/2016   Lab Results  Component Value Date   ALT 19 11/02/2018   AST 23 11/02/2018   ALKPHOS 112 11/02/2018   BILITOT 1.0 11/02/2018      Radiology:   CXR: Stable cardiomegaly. The hila and mediastinum are normal. No pneumothorax. The lungs are clear.   EKG: SVT with LBBB, LAD  ASSESSMENT AND PLAN:   ATRIAL FLUTTER:  Converted to NSR after one dose of IV amiodarone.  However, recurrent flutter  so a second bolus has been ordered and he is on IV drip.  Likely will need IV amiodarone.  Not a great candidate for DOAC but might need Eliquis long term given embolic risk.    CARDIOMYOPATHY:  Appears to be long standing.  No clear etiology but given PVD and risk factors likely ischemic.  BP is low so will not allow much in the way of med titration but I would suggest starting low dose beta blocker before discharge once we get his rhythm managed.     SignedLeavy Fulford 02/16/2019, 4:43 PM

## 2019-02-16 NOTE — Progress Notes (Signed)
Referring Physician(s): Katheren Puller  Supervising Physician: Markus Daft  Patient Status:  [facility] - In-pt  Chief Complaint:  Renal failure  Subjective: Patient familiar to IR service from PICC exchange in 2015, right internal jugular hemodialysis catheter placement in 2018 as well as left upper extremity AV graft angioplasty and thrombectomy on 11/09/2018. He was transferred from [facility] yesterday due to persistent hyperkalemia after hemodialysis.  Secondary to difficult cannulation with left upper arm graft patient had temporary left femoral HD cath placed at [facility].  He is currently undergoing dialysis and needs a tunneled HD cath placed prior to discharge today.  He currently denies fever, headache, chest pain, dyspnea, cough, abdominal/back pain, nausea, vomiting or bleeding.  COVID-19 negative.  Past Medical History:  Diagnosis Date  . Anemia   . Anxiety   . Blood transfusion without reported diagnosis   . CHF (congestive heart failure) ([place])   . Chronic kidney disease   . Depression   . Diabetes mellitus without complication ([place])   . End stage renal disease ([place])    M/W/F [facility]  . Hyperlipidemia   . Neuropathy   . Peripheral vascular disease ([place])   . PTSD (post-traumatic stress disorder)   . Wears glasses    reading   Past Surgical History:  Procedure Laterality Date  . A/V FISTULAGRAM N/A 10/09/2018   Procedure: A/V FISTULAGRAM;  Surgeon: Serafina Mitchell, MD;  Location: [place] [facility];  Service: Cardiovascular;  Laterality: N/A;  . AV FISTULA PLACEMENT Left 09/22/2016   Procedure: CREATION OF LEFT ARM ARTERIOVENOUS (AV) FISTULA;  Surgeon: Waynetta Sandy, MD;  Location: [place];  Service: Vascular;  Laterality: Left;  . AV FISTULA PLACEMENT Left 10/31/2017   Procedure: INSERTION OF ARTERIOVENOUS (AV) GORE-TEX 4-76mm STETCH GRAFT LEFT ARM;  Surgeon: Waynetta Sandy, MD;  Location: [place];  Service: Vascular;  Laterality: Left;  . BASCILIC  VEIN TRANSPOSITION Left 08/17/2017   Procedure: SECOND STAGE BASILIC VEIN TRANSPOSITION LEFT ARM;  Surgeon: Waynetta Sandy, MD;  Location: [place];  Service: Vascular;  Laterality: Left;  . BELOW KNEE LEG AMPUTATION Right   . CHOLECYSTECTOMY    . FOOT SURGERY    . IR FLUORO GUIDE CV LINE RIGHT  05/16/2016  . IR REMOVAL TUN CV CATH W/O FL  05/16/2016  . IR THROMBECTOMY AV FISTULA W/THROMBOLYSIS/PTA INC/SHUNT/IMG LEFT Left 11/09/2018  . IR US GUIDE VASC ACCESS LEFT  11/09/2018  . IR US GUIDE VASC ACCESS RIGHT  05/16/2016  . PERIPHERAL VASCULAR BALLOON ANGIOPLASTY  10/09/2018   Procedure: PERIPHERAL VASCULAR BALLOON ANGIOPLASTY;  Surgeon: Serafina Mitchell, MD;  Location: [place] [facility];  Service: Cardiovascular;;      Allergies: Tape  Medications: Prior to Admission medications   Medication Sig Start Date End Date Taking? Authorizing Provider  aspirin 81 MG chewable tablet Chew 81 mg by mouth daily.   Yes [provider]  LANTUS SOLOSTAR 100 UNIT/ML Solostar Pen Inject 10 Units into the skin at bedtime.  01/18/19  Yes [provider]  multivitamin (RENA-VIT) TABS tablet Take 1 tablet by mouth daily. 08/04/18  Yes [provider]     Vital Signs: BP 111/69   Pulse 92   Temp 98.3 F (36.8 C) (Oral)   Resp 19   Ht 6' (1.829 m)   Wt 229 lb 15 oz (104.3 kg)   SpO2 97%   BMI 31.19 kg/m   Physical Exam awake, alert.  Chest with few scatt  rales;   Heart with regular rate and rhythm.  Abdomen soft, positive bowel sounds, nontender.  Left femoral temp HD cath in place; left upper arm AV graft with positive thrill/bruit.  Imaging: DG Chest Portable 1 View  Result Date: 02/15/2019 CLINICAL DATA:  Shortness of breath, missed dialysis Wednesday EXAM: PORTABLE CHEST 1 VIEW COMPARISON:  Radiograph 11/02/2018 FINDINGS: Bilateral hazy opacities are present in both lungs with some indistinctness of the pulmonary vascularity. No pneumothorax or effusion. Stable  cardiomegaly. No acute osseous or soft tissue abnormality. IMPRESSION: 1. Bilateral hazy opacities in both lungs with some indistinctness of the pulmonary vascularity, consistent with pulmonary edema though atypical infection could have a similar appearance. 2. Stable cardiomegaly. Electronically Signed   By: Lovena Le M.D.   On: 02/15/2019 05:18    Labs:  CBC: Recent Labs    11/02/18 0701 11/03/18 0709 11/09/18 1057 11/09/18 1613 02/15/19 0500  WBC 13.4* 6.9 8.2  --  14.0*  HGB 9.3* 8.9* 7.9* 7.5* 12.4*  HCT 29.6* 27.6* 24.5* 22.0* 38.8*  PLT 274 270 253  --  274    COAGS: Recent Labs    11/09/18 1057  INR 1.2    BMP: Recent Labs    02/15/19 0500 02/15/19 1701 02/15/19 1826 02/16/19 0525  NA 137 134* 133* 137  K >7.5* 6.7* 6.9* 5.3*  CL 98 93* 94* 93*  CO2 20* 26 24 25   GLUCOSE 79 128* 105* 82  BUN 86* 51* 52* 61*  CALCIUM 7.7* 8.3* 7.9* 7.6*  CREATININE 10.37* 7.28* 7.21* 8.43*  GFRNONAA 5* 8* 8* 7*  GFRAA 6* 9* 9* 8*    LIVER FUNCTION TESTS: Recent Labs    10/05/18 1603 10/05/18 2022 11/02/18 0701 11/03/18 0709 02/15/19 1701  BILITOT 0.9 0.7 1.0  --   --   AST 33 32 23  --   --   ALT 38 34 19  --   --   ALKPHOS 112 103 112  --   --   PROT 7.8 7.0 7.9  --   --   ALBUMIN 4.0 3.6 3.9 3.5 3.8    Assessment and Plan: Patient with history of end-stage renal disease, hypertension, anemia, hyperkalemia, existing left upper arm AV graft with ongoing cannulation issues, temporary left femoral HD cath in place utilized now for hemodialysis.  Request received for tunneled HD cath placement prior to patient discharge.  Details/risks of procedure, including but not limited to, internal bleeding, infection, injury to adjacent structures discussed with patient with his understanding and consent.  Procedure scheduled for today.   Electronically Signed: D. Rowe Robert, PA-C 02/16/2019, 11:35 AM   I spent a total of 20 minutes at the the patient's bedside AND  on the patient's hospital floor or unit, greater than 50% of which was counseling/coordinating care for tunneled hemodialysis catheter placement    Patient ID: ASBURY DRIESENGA, male   DOB: [birth year], 49 y.o.   MRN: [id number]

## 2019-02-16 NOTE — Significant Event (Signed)
Rapid Response Event Note  Overview: Time Called: [id number] Arrival Time: [id number] Event Type: Cardiac  Initial Focused Assessment: Patient in IR having tunneled HD cath placed.  Per staff post procedure he went into SVT.  Rapid response team called.  [facility] and bedside RN initially responded.  Transported patient back to nursing department. BP 103/79  HR 149  RR 16  O2 sat 98%  Patient asymptomatic Dr Erlinda Hong at bedside  Interventions: 6mg  Adenosine given x2,  No effect Upon my arrival patient with HR 140s-150. 12mg  Adenosine given IVP,  12 second HR slowed to 30s, Aflutter.  Then immediately returned to SVT 1445  150mg  Amiodarone given IVP 1450  Amiodarone gtt started at 60mg /hr  Patient converted to SR at 1455 BP 121/72  HR 94  RR 17  O2 sat 98%   Plan of Care (if not transferred):  Event Summary: Name of Physician Notified: Florencia Reasons at bedside at      at    Outcome: Stayed in room and stabalized     Raliegh Ip

## 2019-02-16 NOTE — Sedation Documentation (Signed)
Pt presented to IR post hemodialysis for placement of tunneled HD catheter. Pt noted on monitor to be in V-tach at a rate of 149bpm after catheter was inserted and sutured in place. He was asymptomatic and denied any s/s. He did have a slight run during the case which lasted 5 seconds (about 5 beats) then went away. Pt's rate during the case ran in the  high 80's to low 90's. Pt was placed on the Zoll monitor for confirmation of the rate and he was confirmed to be in SVT. His rate increased as high as 160 bpm. Rapid response, the floor RN and the code team were contacted. House coverage and the floor RN both come to IR room 1. IR MD reassessed the location of the catheter to r/o potential cause. 250 ml of saline was given to the pt via the new tunneled HD catheter per floor RN without resolution. House coverage then determined pt be transferred to his room while on the Zoll and accompanied by the [facility], floor RN and IR tech. Pt remained awake, alert and oriented the entire time denying any s/s.

## 2019-02-17 DIAGNOSIS — I739 Peripheral vascular disease, unspecified: Secondary | ICD-10-CM

## 2019-02-17 DIAGNOSIS — E119 Type 2 diabetes mellitus without complications: Secondary | ICD-10-CM

## 2019-02-17 DIAGNOSIS — Z89511 Acquired absence of right leg below knee: Secondary | ICD-10-CM

## 2019-02-17 DIAGNOSIS — Z794 Long term (current) use of insulin: Secondary | ICD-10-CM

## 2019-02-17 DIAGNOSIS — I471 Supraventricular tachycardia: Secondary | ICD-10-CM

## 2019-02-17 DIAGNOSIS — I4891 Unspecified atrial fibrillation: Secondary | ICD-10-CM

## 2019-02-17 DIAGNOSIS — E875 Hyperkalemia: Secondary | ICD-10-CM

## 2019-02-17 LAB — GLUCOSE, CAPILLARY
Glucose-Capillary: 73 mg/dL (ref 70–99)
Glucose-Capillary: 74 mg/dL (ref 70–99)
Glucose-Capillary: 106 mg/dL — ABNORMAL HIGH (ref 70–99)
Glucose-Capillary: 88 mg/dL (ref 70–99)

## 2019-02-17 LAB — RENAL FUNCTION PANEL
Albumin: 3 g/dL — ABNORMAL LOW (ref 3.5–5.0)
Anion gap: 15 (ref 5–15)
BUN: 35 mg/dL — ABNORMAL HIGH (ref 6–20)
CO2: 25 mmol/L (ref 22–32)
Calcium: 7.5 mg/dL — ABNORMAL LOW (ref 8.9–10.3)
Chloride: 97 mmol/L — ABNORMAL LOW (ref 98–111)
Creatinine, Ser: 5.75 mg/dL — ABNORMAL HIGH (ref 0.61–1.24)
GFR calc Af Amer: 12 mL/min — ABNORMAL LOW (ref >60)
GFR calc non Af Amer: 11 mL/min — ABNORMAL LOW (ref >60)
Glucose, Bld: 87 mg/dL (ref 70–99)
Phosphorus: 6.8 mg/dL — ABNORMAL HIGH (ref 2.5–4.6)
Potassium: 4.2 mmol/L (ref 3.5–5.1)
Sodium: 137 mmol/L (ref 135–145)

## 2019-02-17 LAB — CBC
HCT: 35.9 % — ABNORMAL LOW (ref 39.0–52.0)
Hemoglobin: 11.5 g/dL — ABNORMAL LOW (ref 13.0–17.0)
MCH: 33.7 pg (ref 26.0–34.0)
MCHC: 32 g/dL (ref 30.0–36.0)
MCV: 105.3 fL — ABNORMAL HIGH (ref 80.0–100.0)
Platelets: 207 10*3/uL (ref 150–400)
RBC: 3.41 MIL/uL — ABNORMAL LOW (ref 4.22–5.81)
RDW: 12.8 % (ref 11.5–15.5)
WBC: 11.3 10*3/uL — ABNORMAL HIGH (ref 4.0–10.5)
nRBC: 0 % (ref 0.0–0.2)

## 2019-02-17 LAB — HEPARIN LEVEL (UNFRACTIONATED): Heparin Unfractionated: 0.17 IU/mL — ABNORMAL LOW (ref 0.30–0.70)

## 2019-02-17 MED ORDER — SODIUM CHLORIDE 0.9 % IV BOLUS
250.0000 mL | Freq: Once | INTRAVENOUS | Status: AC
  Administered 2019-02-17 (×2): 250 mL via INTRAVENOUS

## 2019-02-17 MED ORDER — HEPARIN (PORCINE) 25000 UT/250ML-% IV SOLN
1100.0000 [IU]/h | INTRAVENOUS | Status: DC
  Administered 2019-02-17: 19:00:00 1300 [IU]/h via INTRAVENOUS
  Administered 2019-02-18: 09:00:00 1400 [IU]/h via INTRAVENOUS
  Administered 2019-02-19: 08:00:00 1000 [IU]/h via INTRAVENOUS
  Filled 2019-02-17 (×3): qty 250

## 2019-02-17 MED ORDER — SODIUM CHLORIDE 0.9 % IV BOLUS
250.0000 mL | Freq: Once | INTRAVENOUS | Status: AC
  Administered 2019-02-17: 250 mL via INTRAVENOUS

## 2019-02-17 MED ORDER — CALCIUM ACETATE (PHOS BINDER) 667 MG PO CAPS
667.0000 mg | ORAL_CAPSULE | Freq: Three times a day (TID) | ORAL | Status: DC
  Administered 2019-02-17 – 2019-02-20 (×7): 667 mg via ORAL
  Filled 2019-02-17 (×7): qty 1

## 2019-02-17 NOTE — Progress Notes (Signed)
[place] for Heparin Indication: atrial fibrillation  Allergies  Allergen Reactions  . Tape Other (See Comments)    Pulls skin off    Patient Measurements: Height: 6' (182.9 cm) Weight: 188 lb 0.8 oz (85.3 kg) IBW/kg (Calculated) : 77.6 Heparin Dosing Weight: 85.3 kg  Vital Signs: Temp: 97.7 F (36.5 C) (01/10 2000) Temp Source: Oral (01/10 2000) BP: 77/64 (01/10 2000) Pulse Rate: 114 (01/10 2000)  Labs: Recent Labs    02/15/19 0500 02/15/19 0725 02/16/19 0525 02/16/19 1437 02/17/19 0218 02/17/19 1958  HGB 12.4*  --   --   --  11.5*  --   HCT 38.8*  --   --   --  35.9*  --   PLT 274  --   --   --  207  --   LABPROT  --   --   --  15.6*  --   --   INR  --   --   --  1.3*  --   --   HEPARINUNFRC  --   --   --   --   --  0.17*  CREATININE 10.37*  --  8.43* 4.66* 5.75*  --   TROPONINIHS 69* 70*  --   --   --   --     Estimated Creatinine Clearance: 17.4 mL/min (A) (by C-G formula based on SCr of 5.75 mg/dL (H)).   Medical History: Past Medical History:  Diagnosis Date  . Anemia   . Anxiety   . Blood transfusion without reported diagnosis   . CHF (congestive heart failure) ([place])   . Chronic kidney disease   . Depression   . Diabetes mellitus without complication ([place])   . End stage renal disease ([place])    M/W/F [facility]  . Hyperlipidemia   . Neuropathy   . Peripheral vascular disease ([place])   . PTSD (post-traumatic stress disorder)     Assessment: 49 yo M presents with new atrial flutter with RVR (CHADS-VASc 3) starting IV heparin. No AC PTA noted. Patient had tunneled HD cath placed 1/9. Procedure was complicated with asymptomatic SVT that was treated with adenosine which revealed atrial flutter. Currently on amiodarone drip after s/p amio bolus x3.   ESRD with HD MWF. CBC stable. No overt bleeding noted. Patient last received SQ Heparin at 0547 this AM for DVT PPX. Will start heparin infusion with no bolus given  recent tunneled HD cath placement per IR.  PM follow up: heparin level below goal this evening.  No known issues with IV infusion.  No overt bleeding or complications noted.  Goal of Therapy:  Heparin level 0.3-0.7 units/ml Monitor platelets by anticoagulation protocol: Yes   Plan:  - Increase IV heparin to 1500 units/hr - Recheck heparin level in 6 hrs. -Daily heparin level and CBC.  Marguerite Olea, [facility] Clinical Pharmacist Phone [phone]  02/17/2019 8:53 PM

## 2019-02-17 NOTE — Progress Notes (Signed)
[facility] Progress Note   Subjective:  Seen in room. Eventful day and night - developed SVT s/p TDC placement. Rapid response called. Adenosine  x 2 given without effect. Amiodarone bolus converted to NSR with amiodarone, then recurred requiring 3 more boluses and drip - now hypotensive. Cardiology following - 542mL bolus given. He is asymptomatic despite the above - no CP/dyspnea.  Objective Vitals:   02/17/19 0600 02/17/19 0623 02/17/19 0738 02/17/19 0800  BP: (!) 79/66 (!) 73/52 (!) 74/60 (!) 75/58  Pulse:  (!) 135 (!) 137 (!) 124  Resp: 15 12 18 14   Temp:  97.6 F (36.4 C) 98.9 F (37.2 C)   TempSrc:  Oral Oral   SpO2:  100% 100% 99%  Weight:      Height:       Physical Exam General: Well appearing, NAD Heart: Tachycardic, no murmur Lungs: CTA anteriorly Abdomen: soft, non-tender Extremities: No LE edema Dialysis Access: LUE AVF + bruit (concern for dysfunction), new [facility]  Additional Objective Labs: Basic Metabolic Panel: Recent Labs  Lab 02/15/19 1701 02/16/19 0525 02/16/19 1437 02/17/19 0218  NA 134* 137 137 137  K 6.7* 5.3* 3.6 4.2  CL 93* 93* 98 97*  CO2 26 25 22 25   GLUCOSE 128* 82 70 87  BUN 51* 61* 27* 35*  CREATININE 7.28* 8.43* 4.66* 5.75*  CALCIUM 8.3* 7.6* 7.7* 7.5*  PHOS 6.3*  --   --  6.8*   Liver Function Tests: Recent Labs  Lab 02/15/19 1701 02/17/19 0218  ALBUMIN 3.8 3.0*   CBC: Recent Labs  Lab 02/15/19 0500 02/17/19 0218  WBC 14.0* 11.3*  NEUTROABS 12.4*  --   HGB 12.4* 11.5*  HCT 38.8* 35.9*  MCV 107.2* 105.3*  PLT 274 207   Studies/Results: IR Fluoro Guide CV Line Right  Result Date: 02/16/2019 INDICATION: 49 year old with end-stage renal disease and currently has a non tunneled femoral catheter due to issues with upper extremity access. Patient needs new hemodialysis access. EXAM: FLUOROSCOPIC AND ULTRASOUND GUIDED PLACEMENT OF A TUNNELED DIALYSIS CATHETER Physician: Stephan Minister. Anselm Pancoast, MD MEDICATIONS: Ancef 2  g; The antibiotic was administered within an appropriate time interval prior to skin puncture. ANESTHESIA/SEDATION: Versed 1.0 mg IV; Fentanyl 125 mcg IV; Moderate Sedation Time:  11 minutes The patient was continuously monitored during the procedure by the interventional radiology nurse under my direct supervision. FLUOROSCOPY TIME:  Fluoroscopy Time: 12 seconds, 2 mGy COMPLICATIONS: Patient tolerated the procedure well but developed tachycardia while sutures were being placed. Patient remained asymptomatic and blood pressure was stable. Rapid response nurse was called to evaluate the patient. Patient was transferred to the floor and underwent treatment for the supraventricular tachycardia by the primary service. PROCEDURE: The procedure was explained to the patient. The risks and benefits of the procedure were discussed and the patient's questions were addressed. Informed consent was obtained from the patient. The patient was placed supine on the interventional table. Ultrasound confirmed a patent right internal jugular vein. Ultrasound images were obtained for documentation. The right neck and chest was prepped and draped in a sterile fashion. The right neck was anesthetized with 1% lidocaine. Maximal barrier sterile technique was utilized including caps, mask, sterile gowns, sterile gloves, sterile drape, hand hygiene and skin antiseptic. A small incision was made with #11 blade scalpel. A 21 gauge needle directed into the right internal jugular vein with ultrasound guidance. A micropuncture dilator set was placed. A 19 cm tip to cuff Palindrome catheter was selected. The  skin below the right clavicle was anesthetized and a small incision was made with an #11 blade scalpel. A subcutaneous tunnel was formed to the vein dermatotomy site. The catheter was brought through the tunnel. The vein dermatotomy site was dilated to accommodate a peel-away sheath. The catheter was placed through the peel-away sheath and  directed into the central venous structures. The tip of the catheter was placed at the SVC and right atrium junction with fluoroscopy. Fluoroscopic images were obtained for documentation. Both lumens were found to aspirate and flush well. The proper amount of heparin was flushed in both lumens. The vein dermatotomy site was closed using a single layer of absorbable suture and Dermabond. The catheter was secured to the skin using Prolene suture. IMPRESSION: Successful placement of a right jugular tunneled dialysis catheter using ultrasound and fluoroscopic guidance. Electronically Signed   By: Markus Daft M.D.   On: 02/16/2019 17:54   IR US Guide Vasc Access Right  Result Date: 02/16/2019 INDICATION: 49 year old with end-stage renal disease and currently has a non tunneled femoral catheter due to issues with upper extremity access. Patient needs new hemodialysis access. EXAM: FLUOROSCOPIC AND ULTRASOUND GUIDED PLACEMENT OF A TUNNELED DIALYSIS CATHETER Physician: Stephan Minister. Anselm Pancoast, MD MEDICATIONS: Ancef 2 g; The antibiotic was administered within an appropriate time interval prior to skin puncture. ANESTHESIA/SEDATION: Versed 1.0 mg IV; Fentanyl 125 mcg IV; Moderate Sedation Time:  11 minutes The patient was continuously monitored during the procedure by the interventional radiology nurse under my direct supervision. FLUOROSCOPY TIME:  Fluoroscopy Time: 12 seconds, 2 mGy COMPLICATIONS: Patient tolerated the procedure well but developed tachycardia while sutures were being placed. Patient remained asymptomatic and blood pressure was stable. Rapid response nurse was called to evaluate the patient. Patient was transferred to the floor and underwent treatment for the supraventricular tachycardia by the primary service. PROCEDURE: The procedure was explained to the patient. The risks and benefits of the procedure were discussed and the patient's questions were addressed. Informed consent was obtained from the patient. The  patient was placed supine on the interventional table. Ultrasound confirmed a patent right internal jugular vein. Ultrasound images were obtained for documentation. The right neck and chest was prepped and draped in a sterile fashion. The right neck was anesthetized with 1% lidocaine. Maximal barrier sterile technique was utilized including caps, mask, sterile gowns, sterile gloves, sterile drape, hand hygiene and skin antiseptic. A small incision was made with #11 blade scalpel. A 21 gauge needle directed into the right internal jugular vein with ultrasound guidance. A micropuncture dilator set was placed. A 19 cm tip to cuff Palindrome catheter was selected. The skin below the right clavicle was anesthetized and a small incision was made with an #11 blade scalpel. A subcutaneous tunnel was formed to the vein dermatotomy site. The catheter was brought through the tunnel. The vein dermatotomy site was dilated to accommodate a peel-away sheath. The catheter was placed through the peel-away sheath and directed into the central venous structures. The tip of the catheter was placed at the SVC and right atrium junction with fluoroscopy. Fluoroscopic images were obtained for documentation. Both lumens were found to aspirate and flush well. The proper amount of heparin was flushed in both lumens. The vein dermatotomy site was closed using a single layer of absorbable suture and Dermabond. The catheter was secured to the skin using Prolene suture. IMPRESSION: Successful placement of a right jugular tunneled dialysis catheter using ultrasound and fluoroscopic guidance. Electronically Signed   By: Quita Skye  Anselm Pancoast M.D.   On: 02/16/2019 17:54   DG Chest Port 1 View  Result Date: 02/16/2019 CLINICAL DATA:  Hyperkalemia.  Volume overload. EXAM: PORTABLE CHEST 1 VIEW COMPARISON:  February 15, 2019 FINDINGS: Stable cardiomegaly. The hila and mediastinum are normal. No pneumothorax. The lungs are clear. IMPRESSION: No active disease.  Electronically Signed   By: Dorise Bullion III M.D   On: 02/16/2019 11:44   Medications: . amiodarone 30 mg/hr (02/17/19 0500)  .  ceFAZolin (ANCEF) IV     . aspirin  81 mg Oral Daily  . atorvastatin  10 mg Oral QPM  . Chlorhexidine Gluconate Cloth  6 each Topical Q0600  . doxycycline  100 mg Oral Q12H  . heparin  5,000 Units Subcutaneous Q8H  . insulin aspart  0-6 Units Subcutaneous TID WC  . multivitamin  1 tablet Oral Daily  . pantoprazole  40 mg Oral Q0600  . sodium bicarbonate  100 mEq Intravenous Once    Dialysis Orders: [facility] MWF. [id number] and prosthetic weighs 1.6 kgHD Bath 1K/2.25CaTime 4:15Heparin 1,000 units bolus then 500 units/hr. AccessLUE AVGBFR 300DFR 600  Hectoral 0.3mcg IV/HD Epogen 600Units IV/HD   Assessment/Plan: 1. Hyperkalemia and volume overload: Due to noncompliance with HD. S/p urgent HD with low K bath at [facility]. Unfortunately, K remained high - transferred to [facility] - improved with HD 1/9. 2. Vascular access: Has LUE AVG with ongoing cannulation issues - issue for some time with last intervention was thrombectomy in October 2020. Dr. Constance Haw was consulted at [facility] and kindly placed a femoral temporary HD catheter in the ED. K improving now s/p 2nd HD. IR consulted, placed TDC. Will need further follow-up as outpatient to see if LUE AVG can be salvaged. 3. Recurrent SVT (new onset): Developed 1/9 after [facility] placement. Repeat K normal. S/p adenosine without effect, then amio x 3 and on drip. Now hypotensive. Cardiology following. 4. ESRD: Usual MWF sched. No HD today - next tomorrow pending improvement in cardiac rhythm. 5. Hypertension/volume: BP now low - see #3. 6. Anemia: Hgb > 12, no ESA needed. 7. Metabolic bone disease: CorrCa ok, Phos ^.  Unclear home binder - start Phoslo 1/meals. 8. Nutrition- Renal diet  Veneta Penton, PA-C 02/17/2019, 9:08 AM  [facility] Pager: [phone]

## 2019-02-17 NOTE — Progress Notes (Signed)
Progress Note  Patient Name: Brendan Campbell Date of Encounter: 02/17/2019  Primary Cardiologist: No primary care provider on file.   Subjective   Denies chest pain or sob. Minimal palpitations.  Inpatient Medications    Scheduled Meds: . aspirin  81 mg Oral Daily  . atorvastatin  10 mg Oral QPM  . calcium acetate  667 mg Oral TID WC  . Chlorhexidine Gluconate Cloth  6 each Topical Q0600  . doxycycline  100 mg Oral Q12H  . heparin  5,000 Units Subcutaneous Q8H  . insulin aspart  0-6 Units Subcutaneous TID WC  . multivitamin  1 tablet Oral Daily  . pantoprazole  40 mg Oral Q0600  . sodium bicarbonate  100 mEq Intravenous Once   Continuous Infusions: . amiodarone 30 mg/hr (02/17/19 0500)  .  ceFAZolin (ANCEF) IV     PRN Meds: acetaminophen **OR** acetaminophen, oxyCODONE, senna-docusate   Vital Signs    Vitals:   02/17/19 0738 02/17/19 0800 02/17/19 0900 02/17/19 0920  BP: (!) 74/60 (!) 75/58 (!) 67/54 (!) 78/56  Pulse: (!) 137 (!) 124 (!) 132 (!) 130  Resp: 18 14 18  (!) 25  Temp: 98.9 F (37.2 C)     TempSrc: Oral     SpO2: 100% 99% 98% 99%  Weight:      Height:        Intake/Output Summary (Last 24 hours) at 02/17/2019 1111 Last data filed at 02/17/2019 0500 Gross per 24 hour  Intake 753.6 ml  Output --  Net 753.6 ml   Filed Weights   02/15/19 1030 02/16/19 0800 02/16/19 1111  Weight: 104.3 kg 87.8 kg 85.3 kg    Telemetry    Atrial flutter with 2:1 AV conduction - Personally Reviewed  ECG    Atrial flutter with 2:1 AV conduction - Personally Reviewed  Physical Exam   GEN: No acute distress.   Neck: No JVD Cardiac: Reg tachy, no murmurs, rubs, or gallops.  Respiratory: Clear to auscultation bilaterally. GI: Soft, nontender, non-distended  MS: No edema; No deformity. Neuro:  Nonfocal  Psych: Normal affect   Labs    Chemistry Recent Labs  Lab 02/15/19 1701 02/16/19 0525 02/16/19 1437 02/17/19 0218  NA 134* 137 137 137  K 6.7* 5.3*  3.6 4.2  CL 93* 93* 98 97*  CO2 26 25 22 25   GLUCOSE 128* 82 70 87  BUN 51* 61* 27* 35*  CREATININE 7.28* 8.43* 4.66* 5.75*  CALCIUM 8.3* 7.6* 7.7* 7.5*  ALBUMIN 3.8  --   --  3.0*  GFRNONAA 8* 7* 14* 11*  GFRAA 9* 8* 16* 12*  ANIONGAP 15 19* 17* 15     Hematology Recent Labs  Lab 02/15/19 0500 02/17/19 0218  WBC 14.0* 11.3*  RBC 3.62* 3.41*  HGB 12.4* 11.5*  HCT 38.8* 35.9*  MCV 107.2* 105.3*  MCH 34.3* 33.7  MCHC 32.0 32.0  RDW 13.0 12.8  PLT 274 207    Cardiac EnzymesNo results for input(s): TROPONINI in the last 168 hours. No results for input(s): TROPIPOC in the last 168 hours.   BNP Recent Labs  Lab 02/15/19 0500  BNP 3,499.0*     DDimer No results for input(s): DDIMER in the last 168 hours.   Radiology    IR Fluoro Guide CV Line Right  Result Date: 02/16/2019 INDICATION: 49 year old with end-stage renal disease and currently has a non tunneled femoral catheter due to issues with upper extremity access. Patient needs new hemodialysis access. EXAM: FLUOROSCOPIC  AND ULTRASOUND GUIDED PLACEMENT OF A TUNNELED DIALYSIS CATHETER Physician: Stephan Minister. Anselm Pancoast, MD MEDICATIONS: Ancef 2 g; The antibiotic was administered within an appropriate time interval prior to skin puncture. ANESTHESIA/SEDATION: Versed 1.0 mg IV; Fentanyl 125 mcg IV; Moderate Sedation Time:  11 minutes The patient was continuously monitored during the procedure by the interventional radiology nurse under my direct supervision. FLUOROSCOPY TIME:  Fluoroscopy Time: 12 seconds, 2 mGy COMPLICATIONS: Patient tolerated the procedure well but developed tachycardia while sutures were being placed. Patient remained asymptomatic and blood pressure was stable. Rapid response nurse was called to evaluate the patient. Patient was transferred to the floor and underwent treatment for the supraventricular tachycardia by the primary service. PROCEDURE: The procedure was explained to the patient. The risks and benefits of the  procedure were discussed and the patient's questions were addressed. Informed consent was obtained from the patient. The patient was placed supine on the interventional table. Ultrasound confirmed a patent right internal jugular vein. Ultrasound images were obtained for documentation. The right neck and chest was prepped and draped in a sterile fashion. The right neck was anesthetized with 1% lidocaine. Maximal barrier sterile technique was utilized including caps, mask, sterile gowns, sterile gloves, sterile drape, hand hygiene and skin antiseptic. A small incision was made with #11 blade scalpel. A 21 gauge needle directed into the right internal jugular vein with ultrasound guidance. A micropuncture dilator set was placed. A 19 cm tip to cuff Palindrome catheter was selected. The skin below the right clavicle was anesthetized and a small incision was made with an #11 blade scalpel. A subcutaneous tunnel was formed to the vein dermatotomy site. The catheter was brought through the tunnel. The vein dermatotomy site was dilated to accommodate a peel-away sheath. The catheter was placed through the peel-away sheath and directed into the central venous structures. The tip of the catheter was placed at the SVC and right atrium junction with fluoroscopy. Fluoroscopic images were obtained for documentation. Both lumens were found to aspirate and flush well. The proper amount of heparin was flushed in both lumens. The vein dermatotomy site was closed using a single layer of absorbable suture and Dermabond. The catheter was secured to the skin using Prolene suture. IMPRESSION: Successful placement of a right jugular tunneled dialysis catheter using ultrasound and fluoroscopic guidance. Electronically Signed   By: Markus Daft M.D.   On: 02/16/2019 17:54   IR US Guide Vasc Access Right  Result Date: 02/16/2019 INDICATION: 49 year old with end-stage renal disease and currently has a non tunneled femoral catheter due to  issues with upper extremity access. Patient needs new hemodialysis access. EXAM: FLUOROSCOPIC AND ULTRASOUND GUIDED PLACEMENT OF A TUNNELED DIALYSIS CATHETER Physician: Stephan Minister. Anselm Pancoast, MD MEDICATIONS: Ancef 2 g; The antibiotic was administered within an appropriate time interval prior to skin puncture. ANESTHESIA/SEDATION: Versed 1.0 mg IV; Fentanyl 125 mcg IV; Moderate Sedation Time:  11 minutes The patient was continuously monitored during the procedure by the interventional radiology nurse under my direct supervision. FLUOROSCOPY TIME:  Fluoroscopy Time: 12 seconds, 2 mGy COMPLICATIONS: Patient tolerated the procedure well but developed tachycardia while sutures were being placed. Patient remained asymptomatic and blood pressure was stable. Rapid response nurse was called to evaluate the patient. Patient was transferred to the floor and underwent treatment for the supraventricular tachycardia by the primary service. PROCEDURE: The procedure was explained to the patient. The risks and benefits of the procedure were discussed and the patient's questions were addressed. Informed consent was obtained from  the patient. The patient was placed supine on the interventional table. Ultrasound confirmed a patent right internal jugular vein. Ultrasound images were obtained for documentation. The right neck and chest was prepped and draped in a sterile fashion. The right neck was anesthetized with 1% lidocaine. Maximal barrier sterile technique was utilized including caps, mask, sterile gowns, sterile gloves, sterile drape, hand hygiene and skin antiseptic. A small incision was made with #11 blade scalpel. A 21 gauge needle directed into the right internal jugular vein with ultrasound guidance. A micropuncture dilator set was placed. A 19 cm tip to cuff Palindrome catheter was selected. The skin below the right clavicle was anesthetized and a small incision was made with an #11 blade scalpel. A subcutaneous tunnel was formed to  the vein dermatotomy site. The catheter was brought through the tunnel. The vein dermatotomy site was dilated to accommodate a peel-away sheath. The catheter was placed through the peel-away sheath and directed into the central venous structures. The tip of the catheter was placed at the SVC and right atrium junction with fluoroscopy. Fluoroscopic images were obtained for documentation. Both lumens were found to aspirate and flush well. The proper amount of heparin was flushed in both lumens. The vein dermatotomy site was closed using a single layer of absorbable suture and Dermabond. The catheter was secured to the skin using Prolene suture. IMPRESSION: Successful placement of a right jugular tunneled dialysis catheter using ultrasound and fluoroscopic guidance. Electronically Signed   By: Markus Daft M.D.   On: 02/16/2019 17:54   DG Chest Port 1 View  Result Date: 02/16/2019 CLINICAL DATA:  Hyperkalemia.  Volume overload. EXAM: PORTABLE CHEST 1 VIEW COMPARISON:  February 15, 2019 FINDINGS: Stable cardiomegaly. The hila and mediastinum are normal. No pneumothorax. The lungs are clear. IMPRESSION: No active disease. Electronically Signed   By: Dorise Bullion III M.D   On: 02/16/2019 11:44    Cardiac Studies   none  Patient Profile     49 y.o. male admitted with atrial flutter with an RVR and treated with IV amiodarone.   Assessment & Plan    1. Atrial flutter - I have discussed the treatment with the patient. Hopefully he will convert to sinus. He was willing to consider DCCV but not flutter ablation. He should be on IV heparin instead of sub q.  2. Chronic systolic heart failure- as per Dr. Lenore Cordia. Low dose beta blocker at discharge.     For questions or updates, please contact [place] Please consult [email or web address] for contact info under Cardiology/STEMI.   Signed, Cristopher Peru, MD  02/17/2019, 11:11 AM  Patient ID: Brendan Campbell, male   DOB: [birth year], 49 y.o.   MRN: [id number]

## 2019-02-17 NOTE — Progress Notes (Addendum)
PROGRESS NOTE  Brendan Campbell [id number] DOB: 1970-10-01 DOA: 02/15/2019 PCP: Patient, No Pcp Per  Brief summary:  Hyperkalemia with EKG changes, volume overload, acute on chronic systolic heart failure due to noncompliance with dialysis.  He underwent urgent dialysis Status post tunneled catheter placement by IR on 1/9th Postprocedure he developed SVT with stable blood pressure   HPI/Recap of past 24 hours:  Heart rate around 120s to 130s, he has hypotension overnight received 250 cc fluids bolusx2, amiodarone dose decreased  He did denies dizziness, no chest pain, no short of breath, he is talking to his family on the phone. C/o neck pain at the site of the tunneled cath     Assessment/Plan: Principal Problem:   Hyperkalemia Active Problems:   DM2 (diabetes mellitus, type 2) (HCC)   HLD (hyperlipidemia)   S/P below knee amputation, right (HCC)   Anemia due to end stage renal disease (HCC)   Weakness   Acute pulmonary edema (HCC)   ESRD on hemodialysis (HCC)   CHF (congestive heart failure) (HCC)   Neuropathy   Peripheral vascular disease (HCC)   Abnormal EKG   Leukocytosis   Atypical pneumonia  SVT, developed after tunneled catheter placement, planned discharge on 1/9 cancelled  --SVT persists after adenosine 6mg  x2, he had a brief a flutter wave lasted about 12-second then went back to SVT after 12 mg adenosinex1 administration -He briefly converted to sinus rhythm after initiation of amiodarone drip and then went back to A. Fib/ a flutter required additional amiodarone bolus overnight  .he also developed hypotension overnight, he may need cardioversion, he is made npo -anticoagulation per cardiology -cardiology consulted, will follow recommendation.   11:30 addendum: cardiology recommended full dose anticoagulation and possible DCCV, heparin drip per pharmacy ordered  Hyperkalemia with EKG changes, volume overload, acute on chronic systolic heart failure due to  noncompliance with dialysis. -Patient presented to the emergency room on 1/8 at 4am via EMS due to short of breath chest pain, report "I just did not feel good " -per nephrology "He chronically skips his Wednesday dialysis despite multiple admissions for pulmonary edema/volume overload and hyperkalemia " -On presentation ,his K was >7.5, EKG has widened QRS, and bradycardia , CXR with pulmonary edema.   -High-sensitivity troponin is not consistent with ACS -His peripheral IV was lost so he did not receive the ordered Calcium/insulin/D50 by reports -He required urgent dialysis, symptom resolved -Nephrology input appreciated  AVG malfunction: - due to not able to cannulate left follow-up for extremity AV graft, a femoral temporary HD catheter was placed in the ED for urgent dialysis -IR consulted for tunneled dialysis catheter placement on 1/9 -shuntogram to be arranged by nephrology   ESRD On HD MWF/Anemia of chronic disease Oliguria Follow-up with nephrology  Acute on chronic systolic heart failure /pulmonary edema Left ventricular EF 25 to 30% on diet echocardiogram obtained in September 2020 Presents with pulmonary edema, resolved after urgent dialysis, repeat chest x-ray is clear   Insulin-dependent type 2 diabetes, fairly controlled -Continue home dose insulin -Follow-up with PCP  Hyperlipidemia Report run out statin, new prescription provided at discharge Advised patient to follow-up with his PCP  PVD Status post right BKA -Report baseline independent, lives with a roommate.    Procedures: a femoral temporary HD catheter placed in the ED on January 8  Urgent dialysis  Tunneled dialysis catheter placement by IR on January 9  Consultations:  Nephrology  IR  cardiology   DVT Prophylaxis: Heparin  Code  Status: Full  Family Communication: patient   Disposition Plan: not ready to discharge     Objective: BP (!) 75/58 Comment: B/P with in  range all night.  MD aware.  Pulse (!) 124   Temp 98.9 F (37.2 C) (Oral)   Resp 14   Ht 6' (1.829 m)   Wt 85.3 kg   SpO2 99%   BMI 25.50 kg/m   Intake/Output Summary (Last 24 hours) at 02/17/2019 0830 Last data filed at 02/17/2019 0500 Gross per 24 hour  Intake 753.6 ml  Output 1000 ml  Net -246.4 ml   Filed Weights   02/15/19 1030 02/16/19 0800 02/16/19 1111  Weight: 104.3 kg 87.8 kg 85.3 kg    Exam: Patient is examined daily including today on 02/17/2019, exams remain the same as of yesterday except that has changed    General:  NAD, + right-sided tunneled catheter  Cardiovascular: Tachycardia  Respiratory: CTABL  Abdomen: Soft/ND/NT, positive BS  Musculoskeletal: No Edema, right BKA  Neuro: alert, oriented   Data Reviewed: Basic Metabolic Panel: Recent Labs  Lab 02/15/19 1701 02/15/19 1826 02/16/19 0525 02/16/19 1437 02/17/19 0218  NA 134* 133* 137 137 137  K 6.7* 6.9* 5.3* 3.6 4.2  CL 93* 94* 93* 98 97*  CO2 26 24 25 22 25   GLUCOSE 128* 105* 82 70 87  BUN 51* 52* 61* 27* 35*  CREATININE 7.28* 7.21* 8.43* 4.66* 5.75*  CALCIUM 8.3* 7.9* 7.6* 7.7* 7.5*  PHOS 6.3*  --   --   --  6.8*   Liver Function Tests: Recent Labs  Lab 02/15/19 1701 02/17/19 0218  ALBUMIN 3.8 3.0*   No results for input(s): LIPASE, AMYLASE in the last 168 hours. No results for input(s): AMMONIA in the last 168 hours. CBC: Recent Labs  Lab 02/15/19 0500 02/17/19 0218  WBC 14.0* 11.3*  NEUTROABS 12.4*  --   HGB 12.4* 11.5*  HCT 38.8* 35.9*  MCV 107.2* 105.3*  PLT 274 207   Cardiac Enzymes:   No results for input(s): CKTOTAL, CKMB, CKMBINDEX, TROPONINI in the last 168 hours. BNP (last 3 results) Recent Labs    02/15/19 0500  BNP 3,499.0*    ProBNP (last 3 results) No results for input(s): PROBNP in the last 8760 hours.  CBG: Recent Labs  Lab 02/16/19 0657 02/16/19 1531 02/16/19 1639 02/16/19 2141 02/17/19 0626  GLUCAP 77 56* 122* 124* 73    Recent  Results (from the past 240 hour(s))  SARS CORONAVIRUS 2 (TAT 6-24 HRS) Nasopharyngeal Nasopharyngeal Swab     Status: None   Collection Time: 02/15/19  4:30 AM   Specimen: Nasopharyngeal Swab  Result Value Ref Range Status   SARS Coronavirus 2 NEGATIVE NEGATIVE Final    Comment: (NOTE) SARS-CoV-2 target nucleic acids are NOT DETECTED. The SARS-CoV-2 RNA is generally detectable in upper and lower respiratory specimens during the acute phase of infection. Negative results do not preclude SARS-CoV-2 infection, do not rule out co-infections with other pathogens, and should not be used as the sole basis for treatment or other patient management decisions. Negative results must be combined with clinical observations, patient history, and epidemiological information. The expected result is Negative. Fact Sheet for Patients: SugarRoll.be Fact Sheet for Healthcare Providers: https://www.woods-mathews.com/ This test is not yet approved or cleared by the [place] FDA and  has been authorized for detection and/or diagnosis of SARS-CoV-2 by FDA under an Emergency Use Authorization (EUA). This EUA will remain  in effect (meaning this test can  be used) for the duration of the COVID-19 declaration under Section 56 4(b)(1) of the Act, 21 U.S.C. section 360bbb-3(b)(1), unless the authorization is terminated or revoked sooner. Performed at [facility], [place] [address], [place], [place] [address]   Respiratory Panel by RT PCR (Flu A&B, Covid) - Nasopharyngeal Swab     Status: None   Collection Time: 02/15/19  6:05 AM   Specimen: Nasopharyngeal Swab  Result Value Ref Range Status   SARS Coronavirus 2 by RT PCR NEGATIVE NEGATIVE Final    Comment: (NOTE) SARS-CoV-2 target nucleic acids are NOT DETECTED. The SARS-CoV-2 RNA is generally detectable in upper respiratoy specimens during the acute phase of infection. The lowest concentration of SARS-CoV-2  viral copies this assay can detect is 131 copies/mL. A negative result does not preclude SARS-Cov-2 infection and should not be used as the sole basis for treatment or other patient management decisions. A negative result may occur with  improper specimen collection/handling, submission of specimen other than nasopharyngeal swab, presence of viral mutation(s) within the areas targeted by this assay, and inadequate number of viral copies (<131 copies/mL). A negative result must be combined with clinical observations, patient history, and epidemiological information. The expected result is Negative. Fact Sheet for Patients:  PinkCheek.be Fact Sheet for Healthcare Providers:  GravelBags.it This test is not yet ap proved or cleared by the [place] FDA and  has been authorized for detection and/or diagnosis of SARS-CoV-2 by FDA under an Emergency Use Authorization (EUA). This EUA will remain  in effect (meaning this test can be used) for the duration of the COVID-19 declaration under Section 564(b)(1) of the Act, 21 U.S.C. section 360bbb-3(b)(1), unless the authorization is terminated or revoked sooner.    Influenza A by PCR NEGATIVE NEGATIVE Final   Influenza B by PCR NEGATIVE NEGATIVE Final    Comment: (NOTE) The Xpert Xpress SARS-CoV-2/FLU/RSV assay is intended as an aid in  the diagnosis of influenza from Nasopharyngeal swab specimens and  should not be used as a sole basis for treatment. Nasal washings and  aspirates are unacceptable for Xpert Xpress SARS-CoV-2/FLU/RSV  testing. Fact Sheet for Patients: PinkCheek.be Fact Sheet for Healthcare Providers: GravelBags.it This test is not yet approved or cleared by the [place] FDA and  has been authorized for detection and/or diagnosis of SARS-CoV-2 by  FDA under an Emergency Use Authorization (EUA). This EUA will  remain  in effect (meaning this test can be used) for the duration of the  Covid-19 declaration under Section 564(b)(1) of the Act, 21  U.S.C. section 360bbb-3(b)(1), unless the authorization is  terminated or revoked. Performed at [facility], [address], [place], [place] [address]      Studies: IR Fluoro Guide CV Line Right  Result Date: 02/16/2019 INDICATION: 49 year old with end-stage renal disease and currently has a non tunneled femoral catheter due to issues with upper extremity access. Patient needs new hemodialysis access. EXAM: FLUOROSCOPIC AND ULTRASOUND GUIDED PLACEMENT OF A TUNNELED DIALYSIS CATHETER Physician: Stephan Minister. Anselm Pancoast, MD MEDICATIONS: Ancef 2 g; The antibiotic was administered within an appropriate time interval prior to skin puncture. ANESTHESIA/SEDATION: Versed 1.0 mg IV; Fentanyl 125 mcg IV; Moderate Sedation Time:  11 minutes The patient was continuously monitored during the procedure by the interventional radiology nurse under my direct supervision. FLUOROSCOPY TIME:  Fluoroscopy Time: 12 seconds, 2 mGy COMPLICATIONS: Patient tolerated the procedure well but developed tachycardia while sutures were being placed. Patient remained asymptomatic and blood pressure was stable. Rapid  response nurse was called to evaluate the patient. Patient was transferred to the floor and underwent treatment for the supraventricular tachycardia by the primary service. PROCEDURE: The procedure was explained to the patient. The risks and benefits of the procedure were discussed and the patient's questions were addressed. Informed consent was obtained from the patient. The patient was placed supine on the interventional table. Ultrasound confirmed a patent right internal jugular vein. Ultrasound images were obtained for documentation. The right neck and chest was prepped and draped in a sterile fashion. The right neck was anesthetized with 1% lidocaine. Maximal barrier sterile technique was utilized  including caps, mask, sterile gowns, sterile gloves, sterile drape, hand hygiene and skin antiseptic. A small incision was made with #11 blade scalpel. A 21 gauge needle directed into the right internal jugular vein with ultrasound guidance. A micropuncture dilator set was placed. A 19 cm tip to cuff Palindrome catheter was selected. The skin below the right clavicle was anesthetized and a small incision was made with an #11 blade scalpel. A subcutaneous tunnel was formed to the vein dermatotomy site. The catheter was brought through the tunnel. The vein dermatotomy site was dilated to accommodate a peel-away sheath. The catheter was placed through the peel-away sheath and directed into the central venous structures. The tip of the catheter was placed at the SVC and right atrium junction with fluoroscopy. Fluoroscopic images were obtained for documentation. Both lumens were found to aspirate and flush well. The proper amount of heparin was flushed in both lumens. The vein dermatotomy site was closed using a single layer of absorbable suture and Dermabond. The catheter was secured to the skin using Prolene suture. IMPRESSION: Successful placement of a right jugular tunneled dialysis catheter using ultrasound and fluoroscopic guidance. Electronically Signed   By: Markus Daft M.D.   On: 02/16/2019 17:54   IR US Guide Vasc Access Right  Result Date: 02/16/2019 INDICATION: 49 year old with end-stage renal disease and currently has a non tunneled femoral catheter due to issues with upper extremity access. Patient needs new hemodialysis access. EXAM: FLUOROSCOPIC AND ULTRASOUND GUIDED PLACEMENT OF A TUNNELED DIALYSIS CATHETER Physician: Stephan Minister. Anselm Pancoast, MD MEDICATIONS: Ancef 2 g; The antibiotic was administered within an appropriate time interval prior to skin puncture. ANESTHESIA/SEDATION: Versed 1.0 mg IV; Fentanyl 125 mcg IV; Moderate Sedation Time:  11 minutes The patient was continuously monitored during the procedure  by the interventional radiology nurse under my direct supervision. FLUOROSCOPY TIME:  Fluoroscopy Time: 12 seconds, 2 mGy COMPLICATIONS: Patient tolerated the procedure well but developed tachycardia while sutures were being placed. Patient remained asymptomatic and blood pressure was stable. Rapid response nurse was called to evaluate the patient. Patient was transferred to the floor and underwent treatment for the supraventricular tachycardia by the primary service. PROCEDURE: The procedure was explained to the patient. The risks and benefits of the procedure were discussed and the patient's questions were addressed. Informed consent was obtained from the patient. The patient was placed supine on the interventional table. Ultrasound confirmed a patent right internal jugular vein. Ultrasound images were obtained for documentation. The right neck and chest was prepped and draped in a sterile fashion. The right neck was anesthetized with 1% lidocaine. Maximal barrier sterile technique was utilized including caps, mask, sterile gowns, sterile gloves, sterile drape, hand hygiene and skin antiseptic. A small incision was made with #11 blade scalpel. A 21 gauge needle directed into the right internal jugular vein with ultrasound guidance. A micropuncture dilator set was placed.  A 19 cm tip to cuff Palindrome catheter was selected. The skin below the right clavicle was anesthetized and a small incision was made with an #11 blade scalpel. A subcutaneous tunnel was formed to the vein dermatotomy site. The catheter was brought through the tunnel. The vein dermatotomy site was dilated to accommodate a peel-away sheath. The catheter was placed through the peel-away sheath and directed into the central venous structures. The tip of the catheter was placed at the SVC and right atrium junction with fluoroscopy. Fluoroscopic images were obtained for documentation. Both lumens were found to aspirate and flush well. The proper  amount of heparin was flushed in both lumens. The vein dermatotomy site was closed using a single layer of absorbable suture and Dermabond. The catheter was secured to the skin using Prolene suture. IMPRESSION: Successful placement of a right jugular tunneled dialysis catheter using ultrasound and fluoroscopic guidance. Electronically Signed   By: Markus Daft M.D.   On: 02/16/2019 17:54    Scheduled Meds: . aspirin  81 mg Oral Daily  . atorvastatin  10 mg Oral QPM  . Chlorhexidine Gluconate Cloth  6 each Topical Q0600  . doxycycline  100 mg Oral Q12H  . heparin  5,000 Units Subcutaneous Q8H  . insulin aspart  0-6 Units Subcutaneous TID WC  . multivitamin  1 tablet Oral Daily  . pantoprazole  40 mg Oral Q0600  . sodium bicarbonate  100 mEq Intravenous Once    Continuous Infusions: . amiodarone 30 mg/hr (02/17/19 0500)  .  ceFAZolin (ANCEF) IV      Time spent: 9mins I have personally reviewed and interpreted daily labs, tele strips, imagings as discussed above under date review session and assessment and plans.  I reviewed all nursing notes, pharmacy notes, consultant notes,  vitals, pertinent old records  I have discussed plan of care as described above with RN , patient and family.     Florencia Reasons MD, PhD, [place]  [facility] Pager [phone]. If 7PM-7AM, please contact night-coverage at [email or web address], password [facility] 02/17/2019, 8:30 AM  LOS: 2 days

## 2019-02-17 NOTE — Progress Notes (Addendum)
ANTICOAGULATION CONSULT NOTE - Initial Consult  Pharmacy Consult for Heparin Indication: atrial fibrillation  Allergies  Allergen Reactions  . Tape Other (See Comments)    Pulls skin off    Patient Measurements: Height: 6' (182.9 cm) Weight: 188 lb 0.8 oz (85.3 kg) IBW/kg (Calculated) : 77.6 Heparin Dosing Weight: 85.3 kg  Vital Signs: Temp: 98.9 F (37.2 C) (01/10 0738) Temp Source: Oral (01/10 0738) BP: 78/56 (01/10 0920) Pulse Rate: 130 (01/10 0920)  Labs: Recent Labs    02/15/19 0500 02/15/19 0725 02/16/19 0525 02/16/19 1437 02/17/19 0218  HGB 12.4*  --   --   --  11.5*  HCT 38.8*  --   --   --  35.9*  PLT 274  --   --   --  207  LABPROT  --   --   --  15.6*  --   INR  --   --   --  1.3*  --   CREATININE 10.37*  --  8.43* 4.66* 5.75*  TROPONINIHS 69* 70*  --   --   --     Estimated Creatinine Clearance: 17.4 mL/min (A) (by C-G formula based on SCr of 5.75 mg/dL (H)).   Medical History: Past Medical History:  Diagnosis Date  . Anemia   . Anxiety   . Blood transfusion without reported diagnosis   . CHF (congestive heart failure) ([place])   . Chronic kidney disease   . Depression   . Diabetes mellitus without complication ([place])   . End stage renal disease ([place])    M/W/F [facility]  . Hyperlipidemia   . Neuropathy   . Peripheral vascular disease ([place])   . PTSD (post-traumatic stress disorder)     Assessment: 49 yo M presents with new atrial flutter with RVR (CHADS-VASc 3) starting IV heparin. No AC PTA noted. Patient had tunneled HD cath placed 1/9. Procedure was complicated with asymptomatic SVT that was treated with adenosine which revealed atrial flutter. Currently on amiodarone drip after s/p amio bolus x3.   ESRD with HD MWF. CBC stable. No overt bleeding noted. Patient last received SQ Heparin at 0547 this AM for DVT PPX. Will start heparin infusion with no bolus given recent tunneled HD cath placement per IR.   Goal of Therapy:  Heparin level  0.3-0.7 units/ml Monitor platelets by anticoagulation protocol: Yes   Plan:  - Start heparin IV at 1300 units/hr - Check 8-hr heparin level - Daily heparin level, CBC - Monitor for bleeding - F/u transition to oral anticoagulant  Richardine Service, PharmD PGY1 Pharmacy Resident Phone: [phone] 02/17/2019  12:06 PM  Please check [email or web address] for unit-specific pharmacy phone numbers.

## 2019-02-18 DIAGNOSIS — I5023 Acute on chronic systolic (congestive) heart failure: Secondary | ICD-10-CM

## 2019-02-18 LAB — CBC
HCT: 31.3 % — ABNORMAL LOW (ref 39.0–52.0)
Hemoglobin: 10.1 g/dL — ABNORMAL LOW (ref 13.0–17.0)
MCH: 34 pg (ref 26.0–34.0)
MCHC: 32.3 g/dL (ref 30.0–36.0)
MCV: 105.4 fL — ABNORMAL HIGH (ref 80.0–100.0)
Platelets: 205 10*3/uL (ref 150–400)
RBC: 2.97 MIL/uL — ABNORMAL LOW (ref 4.22–5.81)
RDW: 12.8 % (ref 11.5–15.5)
WBC: 7.6 10*3/uL (ref 4.0–10.5)
nRBC: 0 % (ref 0.0–0.2)

## 2019-02-18 LAB — HEPARIN LEVEL (UNFRACTIONATED)
Heparin Unfractionated: 0.74 IU/mL — ABNORMAL HIGH (ref 0.30–0.70)
Heparin Unfractionated: 0.86 IU/mL — ABNORMAL HIGH (ref 0.30–0.70)
Heparin Unfractionated: 0.72 IU/mL — ABNORMAL HIGH (ref 0.30–0.70)

## 2019-02-18 LAB — RENAL FUNCTION PANEL
Albumin: 2.6 g/dL — ABNORMAL LOW (ref 3.5–5.0)
Anion gap: 16 — ABNORMAL HIGH (ref 5–15)
BUN: 49 mg/dL — ABNORMAL HIGH (ref 6–20)
CO2: 22 mmol/L (ref 22–32)
Calcium: 7.1 mg/dL — ABNORMAL LOW (ref 8.9–10.3)
Chloride: 100 mmol/L (ref 98–111)
Creatinine, Ser: 7.95 mg/dL — ABNORMAL HIGH (ref 0.61–1.24)
GFR calc Af Amer: 8 mL/min — ABNORMAL LOW (ref >60)
GFR calc non Af Amer: 7 mL/min — ABNORMAL LOW (ref >60)
Glucose, Bld: 92 mg/dL (ref 70–99)
Phosphorus: 8 mg/dL — ABNORMAL HIGH (ref 2.5–4.6)
Potassium: 4.1 mmol/L (ref 3.5–5.1)
Sodium: 138 mmol/L (ref 135–145)

## 2019-02-18 LAB — GLUCOSE, CAPILLARY
Glucose-Capillary: 83 mg/dL (ref 70–99)
Glucose-Capillary: 85 mg/dL (ref 70–99)
Glucose-Capillary: 98 mg/dL (ref 70–99)
Glucose-Capillary: 107 mg/dL — ABNORMAL HIGH (ref 70–99)

## 2019-02-18 MED ORDER — CHLORHEXIDINE GLUCONATE CLOTH 2 % EX PADS
6.0000 | MEDICATED_PAD | Freq: Every day | CUTANEOUS | Status: DC
  Administered 2019-02-19: 6 via TOPICAL

## 2019-02-18 MED ORDER — SODIUM CHLORIDE 0.9 % IV SOLN: INTRAVENOUS | Status: DC

## 2019-02-18 NOTE — Progress Notes (Signed)
Pt received from HD. VSS. Dinner tray ordered. Call light in reach.  Josuha Fontanez, RN  

## 2019-02-18 NOTE — Progress Notes (Addendum)
Progress Note  Patient Name: Brendan Campbell Date of Encounter: 02/18/2019  Primary Cardiologist: No primary care provider on file.   Subjective   Patient is still in [place] with elevated rates. Pressures are still soft. Plan for HD today. No CP or SOB.  Inpatient Medications    Scheduled Meds: . aspirin  81 mg Oral Daily  . atorvastatin  10 mg Oral QPM  . calcium acetate  667 mg Oral TID WC  . Chlorhexidine Gluconate Cloth  6 each Topical Q0600  . Chlorhexidine Gluconate Cloth  6 each Topical Q0600  . insulin aspart  0-6 Units Subcutaneous TID WC  . multivitamin  1 tablet Oral Daily  . pantoprazole  40 mg Oral Q0600  . sodium bicarbonate  100 mEq Intravenous Once   Continuous Infusions: . amiodarone 30 mg/hr (02/18/19 0834)  . heparin 1,400 Units/hr (02/18/19 0835)   PRN Meds: acetaminophen **OR** acetaminophen, senna-docusate   Vital Signs    Vitals:   02/17/19 2000 02/18/19 0150 02/18/19 0619 02/18/19 0758  BP: (!) 77/64 102/62 (!) 83/64 91/66  Pulse: (!) 114  (!) 119 68  Resp: 11 (!) 8 12 16   Temp: 97.7 F (36.5 C)  97.9 F (36.6 C) 97.7 F (36.5 C)  TempSrc: Oral  Oral Oral  SpO2: 100%  100% 97%  Weight:      Height:        Intake/Output Summary (Last 24 hours) at 02/18/2019 1054 Last data filed at 02/17/2019 1800 Gross per 24 hour  Intake 1098.91 ml  Output --  Net 1098.91 ml   Last 3 Weights 02/16/2019 02/16/2019 02/15/2019  Weight (lbs) 188 lb 0.8 oz 193 lb 9 oz 229 lb 15 oz  Weight (kg) 85.3 kg 87.8 kg 104.3 kg      Telemetry    Aflutter, HR 120s - Personally Reviewed  ECG    pending - Personally Reviewed  Physical Exam   GEN: No acute distress.   Neck: No JVD Cardiac: Irreg Ireg; tachycardia, no murmurs, rubs, or gallops.  Respiratory: Clear to auscultation bilaterally. GI: Soft, nontender, non-distended  MS: No edema; No deformity. Neuro:  Nonfocal  Psych: Normal affect   Labs    High Sensitivity Troponin:   Recent Labs  Lab  02/15/19 0500 02/15/19 0725  TROPONINIHS 69* 70*      Chemistry Recent Labs  Lab 02/15/19 1701 02/16/19 1437 02/17/19 0218 02/18/19 0316  NA 134* 137 137 138  K 6.7* 3.6 4.2 4.1  CL 93* 98 97* 100  CO2 26 22 25 22   GLUCOSE 128* 70 87 92  BUN 51* 27* 35* 49*  CREATININE 7.28* 4.66* 5.75* 7.95*  CALCIUM 8.3* 7.7* 7.5* 7.1*  ALBUMIN 3.8  --  3.0* 2.6*  GFRNONAA 8* 14* 11* 7*  GFRAA 9* 16* 12* 8*  ANIONGAP 15 17* 15 16*     Hematology Recent Labs  Lab 02/15/19 0500 02/17/19 0218 02/18/19 0316  WBC 14.0* 11.3* 7.6  RBC 3.62* 3.41* 2.97*  HGB 12.4* 11.5* 10.1*  HCT 38.8* 35.9* 31.3*  MCV 107.2* 105.3* 105.4*  MCH 34.3* 33.7 34.0  MCHC 32.0 32.0 32.3  RDW 13.0 12.8 12.8  PLT 274 207 205    BNP Recent Labs  Lab 02/15/19 0500  BNP 3,499.0*     DDimer No results for input(s): DDIMER in the last 168 hours.   Radiology    IR Fluoro Guide CV Line Right  Result Date: 02/16/2019 INDICATION: 49 year old with end-stage renal disease  and currently has a non tunneled femoral catheter due to issues with upper extremity access. Patient needs new hemodialysis access. EXAM: FLUOROSCOPIC AND ULTRASOUND GUIDED PLACEMENT OF A TUNNELED DIALYSIS CATHETER Physician: Stephan Minister. Anselm Pancoast, MD MEDICATIONS: Ancef 2 g; The antibiotic was administered within an appropriate time interval prior to skin puncture. ANESTHESIA/SEDATION: Versed 1.0 mg IV; Fentanyl 125 mcg IV; Moderate Sedation Time:  11 minutes The patient was continuously monitored during the procedure by the interventional radiology nurse under my direct supervision. FLUOROSCOPY TIME:  Fluoroscopy Time: 12 seconds, 2 mGy COMPLICATIONS: Patient tolerated the procedure well but developed tachycardia while sutures were being placed. Patient remained asymptomatic and blood pressure was stable. Rapid response nurse was called to evaluate the patient. Patient was transferred to the floor and underwent treatment for the supraventricular tachycardia  by the primary service. PROCEDURE: The procedure was explained to the patient. The risks and benefits of the procedure were discussed and the patient's questions were addressed. Informed consent was obtained from the patient. The patient was placed supine on the interventional table. Ultrasound confirmed a patent right internal jugular vein. Ultrasound images were obtained for documentation. The right neck and chest was prepped and draped in a sterile fashion. The right neck was anesthetized with 1% lidocaine. Maximal barrier sterile technique was utilized including caps, mask, sterile gowns, sterile gloves, sterile drape, hand hygiene and skin antiseptic. A small incision was made with #11 blade scalpel. A 21 gauge needle directed into the right internal jugular vein with ultrasound guidance. A micropuncture dilator set was placed. A 19 cm tip to cuff Palindrome catheter was selected. The skin below the right clavicle was anesthetized and a small incision was made with an #11 blade scalpel. A subcutaneous tunnel was formed to the vein dermatotomy site. The catheter was brought through the tunnel. The vein dermatotomy site was dilated to accommodate a peel-away sheath. The catheter was placed through the peel-away sheath and directed into the central venous structures. The tip of the catheter was placed at the SVC and right atrium junction with fluoroscopy. Fluoroscopic images were obtained for documentation. Both lumens were found to aspirate and flush well. The proper amount of heparin was flushed in both lumens. The vein dermatotomy site was closed using a single layer of absorbable suture and Dermabond. The catheter was secured to the skin using Prolene suture. IMPRESSION: Successful placement of a right jugular tunneled dialysis catheter using ultrasound and fluoroscopic guidance. Electronically Signed   By: Markus Daft M.D.   On: 02/16/2019 17:54   IR US Guide Vasc Access Right  Result Date:  02/16/2019 INDICATION: 49 year old with end-stage renal disease and currently has a non tunneled femoral catheter due to issues with upper extremity access. Patient needs new hemodialysis access. EXAM: FLUOROSCOPIC AND ULTRASOUND GUIDED PLACEMENT OF A TUNNELED DIALYSIS CATHETER Physician: Stephan Minister. Anselm Pancoast, MD MEDICATIONS: Ancef 2 g; The antibiotic was administered within an appropriate time interval prior to skin puncture. ANESTHESIA/SEDATION: Versed 1.0 mg IV; Fentanyl 125 mcg IV; Moderate Sedation Time:  11 minutes The patient was continuously monitored during the procedure by the interventional radiology nurse under my direct supervision. FLUOROSCOPY TIME:  Fluoroscopy Time: 12 seconds, 2 mGy COMPLICATIONS: Patient tolerated the procedure well but developed tachycardia while sutures were being placed. Patient remained asymptomatic and blood pressure was stable. Rapid response nurse was called to evaluate the patient. Patient was transferred to the floor and underwent treatment for the supraventricular tachycardia by the primary service. PROCEDURE: The procedure was explained to  the patient. The risks and benefits of the procedure were discussed and the patient's questions were addressed. Informed consent was obtained from the patient. The patient was placed supine on the interventional table. Ultrasound confirmed a patent right internal jugular vein. Ultrasound images were obtained for documentation. The right neck and chest was prepped and draped in a sterile fashion. The right neck was anesthetized with 1% lidocaine. Maximal barrier sterile technique was utilized including caps, mask, sterile gowns, sterile gloves, sterile drape, hand hygiene and skin antiseptic. A small incision was made with #11 blade scalpel. A 21 gauge needle directed into the right internal jugular vein with ultrasound guidance. A micropuncture dilator set was placed. A 19 cm tip to cuff Palindrome catheter was selected. The skin below the  right clavicle was anesthetized and a small incision was made with an #11 blade scalpel. A subcutaneous tunnel was formed to the vein dermatotomy site. The catheter was brought through the tunnel. The vein dermatotomy site was dilated to accommodate a peel-away sheath. The catheter was placed through the peel-away sheath and directed into the central venous structures. The tip of the catheter was placed at the SVC and right atrium junction with fluoroscopy. Fluoroscopic images were obtained for documentation. Both lumens were found to aspirate and flush well. The proper amount of heparin was flushed in both lumens. The vein dermatotomy site was closed using a single layer of absorbable suture and Dermabond. The catheter was secured to the skin using Prolene suture. IMPRESSION: Successful placement of a right jugular tunneled dialysis catheter using ultrasound and fluoroscopic guidance. Electronically Signed   By: Markus Daft M.D.   On: 02/16/2019 17:54    Cardiac Studies   Echo 11/03/18   1. Left ventricular ejection fraction, by visual estimation, is 25 to 30%. The left ventricle has severely decreased function. There is moderately increased left ventricular hypertrophy.  2. Left ventricular diastolic Doppler parameters are indeterminate pattern of LV diastolic filling.  3. Global right ventricle has normal systolic function.The right ventricular size is mildly enlarged. No increase in right ventricular wall thickness.  4. Left atrial size was moderately dilated.  5. Right atrial size was mildly dilated.  6. Mild to moderate aortic valve annular calcification.  7. Mild to moderate mitral annular calcification.  8. The mitral valve is normal in structure. Mild mitral valve regurgitation. No evidence of mitral stenosis.  9. The tricuspid valve is normal in structure. Tricuspid valve regurgitation was not visualized by color flow Doppler. 10. The aortic valve has an indeterminant number of cusps Aortic  valve regurgitation was not visualized by color flow Doppler. Mild aortic valve sclerosis without stenosis. 11. The pulmonic valve was not well visualized. Pulmonic valve regurgitation is not visualized by color flow Doppler. 12. The inferior vena cava is normal in size with greater than 50% respiratory variability, suggesting right atrial pressure of 3 mmHg.   Patient Profile     49 y.o. male who was presented to ED [facility] with SOB after missing HD and admitted with K 7.5 treated with urgent dialysis and IV calcium. He developed tachycardia and was treated with adenosine which showed aflutter and he was started on IV amiodarone.  Assessment & Plan    Aflutter RVR - started on IV amio and had brief conversion to sinus - Tele shows Aflutter with elevated rates - Pressures are soft - Continue amiodarone - continue heparin - CHADSVASC= 2 (CHF, DM2) Patient is not a great candidate for a/c given non-compliance but  will likely need it - Echo in September showed EF 20-25% - NPO for possible TEE/cardioversion   Hyperkalemia/ESRD on HD - Patient has h/o of missing Wednesday HD session - Patient underwent emergent HD - per nephrology/IM  Acute on Chronic Systolic HF - LVEF [id number] in 10/2018 - Suspected ischemic cardiomyopathy - Volume management per HD - Plan to start low dose BB at discharge if pressures allow  HLD - plan to give new rx at discharge  For questions or updates, please contact [facility] Please consult [email or web address] for contact info under        Signed, Cadence Ninfa Meeker, PA-C  02/18/2019, 10:54 AM    Patient seen and examined with Cadence Furth PA-C.  Agree as above, with the following exceptions and changes as noted below. Patient remains in atrial flutter, no chest pain. Gen: NAD, CV: regular tachycardia, no murmurs, Lungs: clear, Abd: soft, Extrem: no edema, Neuro/Psych: alert and oriented x 3, normal mood and affect. All available labs, radiology testing, previous  records reviewed. Plan for probable TEE DCCV tomorrow given no availability in schedule today. Ischemic evaluation may be warranted, could consider lexiscan myoview for risk stratification in setting of long standing reduced EF.   Elouise Munroe

## 2019-02-18 NOTE — Progress Notes (Signed)
ANTICOAGULATION CONSULT NOTE - Follow Up Consult  Pharmacy Consult for Heparin Indication: atrial fibrillation  Allergies  Allergen Reactions  . Tape Other (See Comments)    Pulls skin off    Patient Measurements: Height: 6' (182.9 cm) Weight: 188 lb 0.8 oz (85.3 kg) IBW/kg (Calculated) : 77.6 Heparin Dosing Weight: 85.3 kg  Vital Signs: Temp: 97.7 F (36.5 C) (01/11 0758) Temp Source: Oral (01/11 0758) BP: 91/66 (01/11 0758) Pulse Rate: 68 (01/11 0758)  Labs: Recent Labs    02/16/19 1437 02/17/19 0218 02/17/19 1958 02/18/19 0316  HGB  --  11.5*  --  10.1*  HCT  --  35.9*  --  31.3*  PLT  --  207  --  205  LABPROT 15.6*  --   --   --   INR 1.3*  --   --   --   HEPARINUNFRC  --   --  0.17* 0.74*  CREATININE 4.66* 5.75*  --  7.95*    Estimated Creatinine Clearance: 12.6 mL/min (A) (by C-G formula based on SCr of 7.95 mg/dL (H)).  Assessment: Anticoag: Heparin for new atrial flutter/afib.  Heparin level still above goal after rate adjustment this am. CBC stable.   Goal of Therapy:  Heparin level 0.3-0.7 units/ml Monitor platelets by anticoagulation protocol: Yes   Plan:  Reduce Heparin to 1200 units/hr Recheck level tonight Daily HL and CBC  Erin Hearing PharmD., BCPS Clinical Pharmacist 02/18/2019 8:16 AM

## 2019-02-18 NOTE — Progress Notes (Signed)
[facility] Progress Note   Subjective:  Seen in room. No sob or CP.  No c/o's. Tele shows rapid irreg rhythm.   Objective Vitals:   02/17/19 2000 02/18/19 0150 02/18/19 0619 02/18/19 0758  BP: (!) 77/64 102/62 (!) 83/64 91/66  Pulse: (!) 114  (!) 119 68  Resp: 11 (!) 8 12 16   Temp: 97.7 F (36.5 C)  97.9 F (36.6 C) 97.7 F (36.5 C)  TempSrc: Oral  Oral Oral  SpO2: 100%  100% 97%  Weight:      Height:       Physical Exam General: Well appearing, NAD Heart: Tachycardic, no murmur Lungs: CTA anteriorly Abdomen: soft, non-tender Extremities: No LE edema Dialysis Access: LUE AVF + bruit (concern for dysfunction), new R [facility]  Additional Objective Labs: Basic Metabolic Panel: Recent Labs  Lab 02/15/19 1701 02/16/19 1437 02/17/19 0218 02/18/19 0316  NA 134* 137 137 138  K 6.7* 3.6 4.2 4.1  CL 93* 98 97* 100  CO2 26 22 25 22   GLUCOSE 128* 70 87 92  BUN 51* 27* 35* 49*  CREATININE 7.28* 4.66* 5.75* 7.95*  CALCIUM 8.3* 7.7* 7.5* 7.1*  PHOS 6.3*  --  6.8* 8.0*   Liver Function Tests: Recent Labs  Lab 02/15/19 1701 02/17/19 0218 02/18/19 0316  ALBUMIN 3.8 3.0* 2.6*   CBC: Recent Labs  Lab 02/15/19 0500 02/17/19 0218 02/18/19 0316  WBC 14.0* 11.3* 7.6  NEUTROABS 12.4*  --   --   HGB 12.4* 11.5* 10.1*  HCT 38.8* 35.9* 31.3*  MCV 107.2* 105.3* 105.4*  PLT 274 207 205   Studies/Results: IR Fluoro Guide CV Line Right  Result Date: 02/16/2019 INDICATION: 49 year old with end-stage renal disease and currently has a non tunneled femoral catheter due to issues with upper extremity access. Patient needs new hemodialysis access. EXAM: FLUOROSCOPIC AND ULTRASOUND GUIDED PLACEMENT OF A TUNNELED DIALYSIS CATHETER Physician: Stephan Minister. Anselm Pancoast, MD MEDICATIONS: Ancef 2 g; The antibiotic was administered within an appropriate time interval prior to skin puncture. ANESTHESIA/SEDATION: Versed 1.0 mg IV; Fentanyl 125 mcg IV; Moderate Sedation Time:  11 minutes The  patient was continuously monitored during the procedure by the interventional radiology nurse under my direct supervision. FLUOROSCOPY TIME:  Fluoroscopy Time: 12 seconds, 2 mGy COMPLICATIONS: Patient tolerated the procedure well but developed tachycardia while sutures were being placed. Patient remained asymptomatic and blood pressure was stable. Rapid response nurse was called to evaluate the patient. Patient was transferred to the floor and underwent treatment for the supraventricular tachycardia by the primary service. PROCEDURE: The procedure was explained to the patient. The risks and benefits of the procedure were discussed and the patient's questions were addressed. Informed consent was obtained from the patient. The patient was placed supine on the interventional table. Ultrasound confirmed a patent right internal jugular vein. Ultrasound images were obtained for documentation. The right neck and chest was prepped and draped in a sterile fashion. The right neck was anesthetized with 1% lidocaine. Maximal barrier sterile technique was utilized including caps, mask, sterile gowns, sterile gloves, sterile drape, hand hygiene and skin antiseptic. A small incision was made with #11 blade scalpel. A 21 gauge needle directed into the right internal jugular vein with ultrasound guidance. A micropuncture dilator set was placed. A 19 cm tip to cuff Palindrome catheter was selected. The skin below the right clavicle was anesthetized and a small incision was made with an #11 blade scalpel. A subcutaneous tunnel was formed to the vein dermatotomy site.  The catheter was brought through the tunnel. The vein dermatotomy site was dilated to accommodate a peel-away sheath. The catheter was placed through the peel-away sheath and directed into the central venous structures. The tip of the catheter was placed at the SVC and right atrium junction with fluoroscopy. Fluoroscopic images were obtained for documentation. Both lumens  were found to aspirate and flush well. The proper amount of heparin was flushed in both lumens. The vein dermatotomy site was closed using a single layer of absorbable suture and Dermabond. The catheter was secured to the skin using Prolene suture. IMPRESSION: Successful placement of a right jugular tunneled dialysis catheter using ultrasound and fluoroscopic guidance. Electronically Signed   By: Markus Daft M.D.   On: 02/16/2019 17:54   IR US Guide Vasc Access Right  Result Date: 02/16/2019 INDICATION: 49 year old with end-stage renal disease and currently has a non tunneled femoral catheter due to issues with upper extremity access. Patient needs new hemodialysis access. EXAM: FLUOROSCOPIC AND ULTRASOUND GUIDED PLACEMENT OF A TUNNELED DIALYSIS CATHETER Physician: Stephan Minister. Anselm Pancoast, MD MEDICATIONS: Ancef 2 g; The antibiotic was administered within an appropriate time interval prior to skin puncture. ANESTHESIA/SEDATION: Versed 1.0 mg IV; Fentanyl 125 mcg IV; Moderate Sedation Time:  11 minutes The patient was continuously monitored during the procedure by the interventional radiology nurse under my direct supervision. FLUOROSCOPY TIME:  Fluoroscopy Time: 12 seconds, 2 mGy COMPLICATIONS: Patient tolerated the procedure well but developed tachycardia while sutures were being placed. Patient remained asymptomatic and blood pressure was stable. Rapid response nurse was called to evaluate the patient. Patient was transferred to the floor and underwent treatment for the supraventricular tachycardia by the primary service. PROCEDURE: The procedure was explained to the patient. The risks and benefits of the procedure were discussed and the patient's questions were addressed. Informed consent was obtained from the patient. The patient was placed supine on the interventional table. Ultrasound confirmed a patent right internal jugular vein. Ultrasound images were obtained for documentation. The right neck and chest was prepped  and draped in a sterile fashion. The right neck was anesthetized with 1% lidocaine. Maximal barrier sterile technique was utilized including caps, mask, sterile gowns, sterile gloves, sterile drape, hand hygiene and skin antiseptic. A small incision was made with #11 blade scalpel. A 21 gauge needle directed into the right internal jugular vein with ultrasound guidance. A micropuncture dilator set was placed. A 19 cm tip to cuff Palindrome catheter was selected. The skin below the right clavicle was anesthetized and a small incision was made with an #11 blade scalpel. A subcutaneous tunnel was formed to the vein dermatotomy site. The catheter was brought through the tunnel. The vein dermatotomy site was dilated to accommodate a peel-away sheath. The catheter was placed through the peel-away sheath and directed into the central venous structures. The tip of the catheter was placed at the SVC and right atrium junction with fluoroscopy. Fluoroscopic images were obtained for documentation. Both lumens were found to aspirate and flush well. The proper amount of heparin was flushed in both lumens. The vein dermatotomy site was closed using a single layer of absorbable suture and Dermabond. The catheter was secured to the skin using Prolene suture. IMPRESSION: Successful placement of a right jugular tunneled dialysis catheter using ultrasound and fluoroscopic guidance. Electronically Signed   By: Markus Daft M.D.   On: 02/16/2019 17:54   Medications: . amiodarone 30 mg/hr (02/18/19 0834)  . heparin 1,400 Units/hr (02/18/19 0835)   . aspirin  81 mg Oral Daily  . atorvastatin  10 mg Oral QPM  . calcium acetate  667 mg Oral TID WC  . Chlorhexidine Gluconate Cloth  6 each Topical Q0600  . doxycycline  100 mg Oral Q12H  . insulin aspart  0-6 Units Subcutaneous TID WC  . multivitamin  1 tablet Oral Daily  . pantoprazole  40 mg Oral Q0600  . sodium bicarbonate  100 mEq Intravenous Once    Dialysis Orders: [facility] MWF  4h 73min  87.5kg   1K/2.25 bath  Hep 1000+ 500u/hr  LUE AVG  300/600   - prosthetic weighs 1.6 kg  - Hectoral 0.28mcg IV/HD   - Epogen 600Units IV/HD   Assessment/Plan: 1. Hyperkalemia and volume overload: Due to noncompliance with HD. S/p urgent HD with low K bath at [facility]. Unfortunately, K remained high - transferred to [facility] - improved with HD 1/9. Plan HD today using AVG.  2. Vascular access: Has LUE AVG with ongoing cannulation issues - issue for some time with last intervention was thrombectomy in October 2020. temp cath at [facility] now removed, TDC placed here.  3. SVT (new onset): Developed 1/9 after [facility] placement. Repeat K normal. S/p adenosine without effect, then amio x 3 and on drip. Now hypotensive. Cardiology following. 4. ESRD: Usual MWF sched. HD today, will see if AVG works, sounds and looks very good on exam.  5. Hypertension/volume: BP now low - see #3. Below edw, no fluid off 6. Anemia: Hgb > 12, no ESA needed. 7. Metabolic bone disease: CorrCa ok, Phos ^.  Unclear home binder - start Phoslo 1/meals. 8. Nutrition- Renal diet  Kelly Splinter, MD   [facility] 02/18/2019, 9:44 AM

## 2019-02-18 NOTE — Progress Notes (Signed)
ANTICOAGULATION CONSULT NOTE - Follow Up Consult  Pharmacy Consult for heparin Indication: atrial fibrillation  Labs: Recent Labs    02/16/19 1437 02/17/19 0218 02/18/19 0316 02/18/19 0959 02/18/19 2125  HGB  --  11.5* 10.1*  --   --   HCT  --  35.9* 31.3*  --   --   PLT  --  207 205  --   --   LABPROT 15.6*  --   --   --   --   INR 1.3*  --   --   --   --   HEPARINUNFRC  --   --  0.74* 0.86* 0.72*  CREATININE 4.66* 5.75* 7.95*  --   --     Assessment: 49yo male remains supratherapeutic on heparin after rate changes though closer to goal; no gtt issues or signs of bleeding per RN.  Goal of Therapy:  Heparin level 0.3-0.7 units/ml   Plan:  Will decrease heparin gtt by 2 units/kg/hr to 1000 units/hr and check level in 8 hours.    Wynona Neat, PharmD, BCPS  02/18/2019,10:59 PM

## 2019-02-18 NOTE — Progress Notes (Signed)
PROGRESS NOTE    BLISS FESSEL  [id number] DOB: 05-28-1970 DOA: 02/15/2019 PCP: Patient, No Pcp Per   Brief Narrative:  49 YO man coming to ER with hyperkalemiawith EKG changes,volume overload, acute on chronic systolic heart failuredue to noncompliance with dialysis.  He underwent urgent dialysis status post tunneled catheter placement by IR on 1/9th Postprocedure he developed SVT with stable blood pressure, then A fib/flutter  Last 24 hours: heparin drip currently, consideration from cardiology of cardioversion  Assessment & Plan:   Principal Problem:   Hyperkalemia Active Problems:   DM2 (diabetes mellitus, type 2) (HCC)   HLD (hyperlipidemia)   S/P below knee amputation, right (HCC)   Anemia due to end stage renal disease (HCC)   Weakness   Acute pulmonary edema (HCC)   ESRD on hemodialysis (HCC)   CHF (congestive heart failure) (HCC)   Neuropathy   Peripheral vascular disease (HCC)   Abnormal EKG   Leukocytosis   Atypical pneumonia  A fib/flutter: -SVT after tunneled catheter, failed to respond to adenosine times 2, converted to sinus after amiodarone and then into A fib/flutter -heparin drip for anticoagulation -amiodarone drip -cardiology consulted, will follow recommendation, anticipated cardioversion/tee 02/28/19, npo after midnight  Hyperkalemiawith EKG changes,volume overload, acute on chronic systolic heart failuredue to noncompliance with dialysis. -per nephrology "He chronically skips his Wednesday dialysis despite multiple admissions for pulmonary edema/volume overload and hyperkalemia" -On presentation,his K was >7.5,urgent dialysis with response of symptoms -Nephrology input appreciated -continue HD while inpatient  AVG malfunction: -due to not able to cannulate left follow-up for extremity AV graft, afemoral temporary HD catheterwas placedin the ED for urgent dialysis -IR consulted for tunneled dialysis catheter placement on  1/9 -shuntogram to be arranged by nephrology   ESRD On HD MWF/Anemia of chronic disease Oliguria Follow-up with nephrology  Acute on chronic systolic heart failure /pulmonary edema Left ventricular EF 25 to 30% on diet echocardiogram obtained in September 2020 Presentswith pulmonary edema,resolved after urgent dialysis,repeat chest x-ray is clear   Insulin-dependent type 2 diabetes,fairly controlled -Continue home dose insulin -Follow-up with PCP  Hyperlipidemia Report run out statin,new prescription provided at discharge Advised patient to follow-up with his PCP  PVDStatus post right BKA -Report baseline independent,lives with a roommate.  DVT prophylaxis: Heparin drip Code Status: Full Family Communication: with patient only Disposition Plan: awaiting TEE/cardioversion 02/19/19  Consultants:   Cardiology  Nephrology  Procedures:   Upcoming TEE/cardioversion likely 02/19/19  Subjective: Feeling well overall, some palpitations and can feel heart beating fast. Denies chest pain/tightness. Denies SOB. Denies abdominal pain, diarrhea, constipation. Denies blood in stool or bleeding. Denies dizziness.   Objective: Vitals:   02/17/19 2000 02/18/19 0150 02/18/19 0619 02/18/19 0758  BP: (!) 77/64 102/62 (!) 83/64 91/66  Pulse: (!) 114  (!) 119 68  Resp: 11 (!) 8 12 16   Temp: 97.7 F (36.5 C)  97.9 F (36.6 C) 97.7 F (36.5 C)  TempSrc: Oral  Oral Oral  SpO2: 100%  100% 97%  Weight:      Height:        Intake/Output Summary (Last 24 hours) at 02/18/2019 0844 Last data filed at 02/17/2019 1800 Gross per 24 hour  Intake 1098.91 ml  Output --  Net 1098.91 ml   Filed Weights   02/15/19 1030 02/16/19 0800 02/16/19 1111  Weight: 104.3 kg 87.8 kg 85.3 kg    Examination:  General exam: Appears calm and comfortable  Respiratory system: Clear to auscultation. Respiratory effort normal. Cardiovascular system: fast,  irreg irreg. No JVD, murmurs, rubs,  gallops or clicks. Gastrointestinal system: Abdomen is nondistended, soft and nontender. No organomegaly or masses felt. Normal bowel sounds heard. Central nervous system: Alert and oriented. No focal neurological deficits. Extremities: Symmetric 5 x 5 power, BKA right. Skin: No rashes, lesions or ulcers Psychiatry: Judgement and insight appear normal. Mood & affect appropriate.   Data Reviewed: I have personally reviewed following labs and imaging studies  CBC: Recent Labs  Lab 02/15/19 0500 02/17/19 0218 02/18/19 0316  WBC 14.0* 11.3* 7.6  NEUTROABS 12.4*  --   --   HGB 12.4* 11.5* 10.1*  HCT 38.8* 35.9* 31.3*  MCV 107.2* 105.3* 105.4*  PLT 274 207 [id number]   Basic Metabolic Panel: Recent Labs  Lab 02/15/19 1701 02/15/19 1826 02/16/19 0525 02/16/19 1437 02/17/19 0218 02/18/19 0316  NA 134* 133* 137 137 137 138  K 6.7* 6.9* 5.3* 3.6 4.2 4.1  CL 93* 94* 93* 98 97* 100  CO2 26 24 25 22 25 22   GLUCOSE 128* 105* 82 70 87 92  BUN 51* 52* 61* 27* 35* 49*  CREATININE 7.28* 7.21* 8.43* 4.66* 5.75* 7.95*  CALCIUM 8.3* 7.9* 7.6* 7.7* 7.5* 7.1*  PHOS 6.3*  --   --   --  6.8* 8.0*   GFR: Estimated Creatinine Clearance: 12.6 mL/min (A) (by C-G formula based on SCr of 7.95 mg/dL (H)). Liver Function Tests: Recent Labs  Lab 02/15/19 1701 02/17/19 0218 02/18/19 0316  ALBUMIN 3.8 3.0* 2.6*   No results for input(s): LIPASE, AMYLASE in the last 168 hours. No results for input(s): AMMONIA in the last 168 hours. Coagulation Profile: Recent Labs  Lab 02/16/19 1437  INR 1.3*   Cardiac Enzymes: No results for input(s): CKTOTAL, CKMB, CKMBINDEX, TROPONINI in the last 168 hours. BNP (last 3 results) No results for input(s): PROBNP in the last 8760 hours. HbA1C: No results for input(s): HGBA1C in the last 72 hours. CBG: Recent Labs  Lab 02/17/19 0626 02/17/19 1156 02/17/19 1618 02/17/19 2210 02/18/19 0616  GLUCAP 73 74 106* 88 83   Lipid Profile: No results for input(s):  CHOL, HDL, LDLCALC, TRIG, CHOLHDL, LDLDIRECT in the last 72 hours. Thyroid Function Tests: No results for input(s): TSH, T4TOTAL, FREET4, T3FREE, THYROIDAB in the last 72 hours. Anemia Panel: No results for input(s): VITAMINB12, FOLATE, FERRITIN, TIBC, IRON, RETICCTPCT in the last 72 hours. Sepsis Labs: No results for input(s): PROCALCITON, LATICACIDVEN in the last 168 hours.  Recent Results (from the past 240 hour(s))  SARS CORONAVIRUS 2 (TAT 6-24 HRS) Nasopharyngeal Nasopharyngeal Swab     Status: None   Collection Time: 02/15/19  4:30 AM   Specimen: Nasopharyngeal Swab  Result Value Ref Range Status   SARS Coronavirus 2 NEGATIVE NEGATIVE Final    Comment: (NOTE) SARS-CoV-2 target nucleic acids are NOT DETECTED. The SARS-CoV-2 RNA is generally detectable in upper and lower respiratory specimens during the acute phase of infection. Negative results do not preclude SARS-CoV-2 infection, do not rule out co-infections with other pathogens, and should not be used as the sole basis for treatment or other patient management decisions. Negative results must be combined with clinical observations, patient history, and epidemiological information. The expected result is Negative. Fact Sheet for Patients: SugarRoll.be Fact Sheet for Healthcare Providers: https://www.woods-mathews.com/ This test is not yet approved or cleared by the [place] FDA and  has been authorized for detection and/or diagnosis of SARS-CoV-2 by FDA under an Emergency Use Authorization (EUA). This EUA will remain  in  effect (meaning this test can be used) for the duration of the COVID-19 declaration under Section 56 4(b)(1) of the Act, 21 U.S.C. section 360bbb-3(b)(1), unless the authorization is terminated or revoked sooner. Performed at [facility], [place] [address], [place], [place] [address]   Respiratory Panel by RT PCR (Flu A&B, Covid) - Nasopharyngeal Swab      Status: None   Collection Time: 02/15/19  6:05 AM   Specimen: Nasopharyngeal Swab  Result Value Ref Range Status   SARS Coronavirus 2 by RT PCR NEGATIVE NEGATIVE Final    Comment: (NOTE) SARS-CoV-2 target nucleic acids are NOT DETECTED. The SARS-CoV-2 RNA is generally detectable in upper respiratoy specimens during the acute phase of infection. The lowest concentration of SARS-CoV-2 viral copies this assay can detect is 131 copies/mL. A negative result does not preclude SARS-Cov-2 infection and should not be used as the sole basis for treatment or other patient management decisions. A negative result may occur with  improper specimen collection/handling, submission of specimen other than nasopharyngeal swab, presence of viral mutation(s) within the areas targeted by this assay, and inadequate number of viral copies (<131 copies/mL). A negative result must be combined with clinical observations, patient history, and epidemiological information. The expected result is Negative. Fact Sheet for Patients:  PinkCheek.be Fact Sheet for Healthcare Providers:  GravelBags.it This test is not yet ap proved or cleared by the [place] FDA and  has been authorized for detection and/or diagnosis of SARS-CoV-2 by FDA under an Emergency Use Authorization (EUA). This EUA will remain  in effect (meaning this test can be used) for the duration of the COVID-19 declaration under Section 564(b)(1) of the Act, 21 U.S.C. section 360bbb-3(b)(1), unless the authorization is terminated or revoked sooner.    Influenza A by PCR NEGATIVE NEGATIVE Final   Influenza B by PCR NEGATIVE NEGATIVE Final    Comment: (NOTE) The Xpert Xpress SARS-CoV-2/FLU/RSV assay is intended as an aid in  the diagnosis of influenza from Nasopharyngeal swab specimens and  should not be used as a sole basis for treatment. Nasal washings and  aspirates are unacceptable for  Xpert Xpress SARS-CoV-2/FLU/RSV  testing. Fact Sheet for Patients: PinkCheek.be Fact Sheet for Healthcare Providers: GravelBags.it This test is not yet approved or cleared by the [place] FDA and  has been authorized for detection and/or diagnosis of SARS-CoV-2 by  FDA under an Emergency Use Authorization (EUA). This EUA will remain  in effect (meaning this test can be used) for the duration of the  Covid-19 declaration under Section 564(b)(1) of the Act, 21  U.S.C. section 360bbb-3(b)(1), unless the authorization is  terminated or revoked. Performed at [facility], [address], [place], [place] [address]          Radiology Studies: IR Fluoro Guide CV Line Right  Result Date: 02/16/2019 INDICATION: 49 year old with end-stage renal disease and currently has a non tunneled femoral catheter due to issues with upper extremity access. Patient needs new hemodialysis access. EXAM: FLUOROSCOPIC AND ULTRASOUND GUIDED PLACEMENT OF A TUNNELED DIALYSIS CATHETER Physician: Stephan Minister. Anselm Pancoast, MD MEDICATIONS: Ancef 2 g; The antibiotic was administered within an appropriate time interval prior to skin puncture. ANESTHESIA/SEDATION: Versed 1.0 mg IV; Fentanyl 125 mcg IV; Moderate Sedation Time:  11 minutes The patient was continuously monitored during the procedure by the interventional radiology nurse under my direct supervision. FLUOROSCOPY TIME:  Fluoroscopy Time: 12 seconds, 2 mGy COMPLICATIONS: Patient tolerated the procedure well but developed tachycardia while sutures were being  placed. Patient remained asymptomatic and blood pressure was stable. Rapid response nurse was called to evaluate the patient. Patient was transferred to the floor and underwent treatment for the supraventricular tachycardia by the primary service. PROCEDURE: The procedure was explained to the patient. The risks and benefits of the procedure were discussed and the  patient's questions were addressed. Informed consent was obtained from the patient. The patient was placed supine on the interventional table. Ultrasound confirmed a patent right internal jugular vein. Ultrasound images were obtained for documentation. The right neck and chest was prepped and draped in a sterile fashion. The right neck was anesthetized with 1% lidocaine. Maximal barrier sterile technique was utilized including caps, mask, sterile gowns, sterile gloves, sterile drape, hand hygiene and skin antiseptic. A small incision was made with #11 blade scalpel. A 21 gauge needle directed into the right internal jugular vein with ultrasound guidance. A micropuncture dilator set was placed. A 19 cm tip to cuff Palindrome catheter was selected. The skin below the right clavicle was anesthetized and a small incision was made with an #11 blade scalpel. A subcutaneous tunnel was formed to the vein dermatotomy site. The catheter was brought through the tunnel. The vein dermatotomy site was dilated to accommodate a peel-away sheath. The catheter was placed through the peel-away sheath and directed into the central venous structures. The tip of the catheter was placed at the SVC and right atrium junction with fluoroscopy. Fluoroscopic images were obtained for documentation. Both lumens were found to aspirate and flush well. The proper amount of heparin was flushed in both lumens. The vein dermatotomy site was closed using a single layer of absorbable suture and Dermabond. The catheter was secured to the skin using Prolene suture. IMPRESSION: Successful placement of a right jugular tunneled dialysis catheter using ultrasound and fluoroscopic guidance. Electronically Signed   By: Markus Daft M.D.   On: 02/16/2019 17:54   IR US Guide Vasc Access Right  Result Date: 02/16/2019 INDICATION: 49 year old with end-stage renal disease and currently has a non tunneled femoral catheter due to issues with upper extremity access.  Patient needs new hemodialysis access. EXAM: FLUOROSCOPIC AND ULTRASOUND GUIDED PLACEMENT OF A TUNNELED DIALYSIS CATHETER Physician: Stephan Minister. Anselm Pancoast, MD MEDICATIONS: Ancef 2 g; The antibiotic was administered within an appropriate time interval prior to skin puncture. ANESTHESIA/SEDATION: Versed 1.0 mg IV; Fentanyl 125 mcg IV; Moderate Sedation Time:  11 minutes The patient was continuously monitored during the procedure by the interventional radiology nurse under my direct supervision. FLUOROSCOPY TIME:  Fluoroscopy Time: 12 seconds, 2 mGy COMPLICATIONS: Patient tolerated the procedure well but developed tachycardia while sutures were being placed. Patient remained asymptomatic and blood pressure was stable. Rapid response nurse was called to evaluate the patient. Patient was transferred to the floor and underwent treatment for the supraventricular tachycardia by the primary service. PROCEDURE: The procedure was explained to the patient. The risks and benefits of the procedure were discussed and the patient's questions were addressed. Informed consent was obtained from the patient. The patient was placed supine on the interventional table. Ultrasound confirmed a patent right internal jugular vein. Ultrasound images were obtained for documentation. The right neck and chest was prepped and draped in a sterile fashion. The right neck was anesthetized with 1% lidocaine. Maximal barrier sterile technique was utilized including caps, mask, sterile gowns, sterile gloves, sterile drape, hand hygiene and skin antiseptic. A small incision was made with #11 blade scalpel. A 21 gauge needle directed into the right internal jugular  vein with ultrasound guidance. A micropuncture dilator set was placed. A 19 cm tip to cuff Palindrome catheter was selected. The skin below the right clavicle was anesthetized and a small incision was made with an #11 blade scalpel. A subcutaneous tunnel was formed to the vein dermatotomy site. The  catheter was brought through the tunnel. The vein dermatotomy site was dilated to accommodate a peel-away sheath. The catheter was placed through the peel-away sheath and directed into the central venous structures. The tip of the catheter was placed at the SVC and right atrium junction with fluoroscopy. Fluoroscopic images were obtained for documentation. Both lumens were found to aspirate and flush well. The proper amount of heparin was flushed in both lumens. The vein dermatotomy site was closed using a single layer of absorbable suture and Dermabond. The catheter was secured to the skin using Prolene suture. IMPRESSION: Successful placement of a right jugular tunneled dialysis catheter using ultrasound and fluoroscopic guidance. Electronically Signed   By: Markus Daft M.D.   On: 02/16/2019 17:54        Scheduled Meds: . aspirin  81 mg Oral Daily  . atorvastatin  10 mg Oral QPM  . calcium acetate  667 mg Oral TID WC  . Chlorhexidine Gluconate Cloth  6 each Topical Q0600  . doxycycline  100 mg Oral Q12H  . insulin aspart  0-6 Units Subcutaneous TID WC  . multivitamin  1 tablet Oral Daily  . pantoprazole  40 mg Oral Q0600  . sodium bicarbonate  100 mEq Intravenous Once   Continuous Infusions: . amiodarone 30 mg/hr (02/18/19 0834)  . heparin 1,400 Units/hr (02/18/19 0835)     LOS: 3 days   Time spent: 25 minutes   Hoyt Koch, MD [facility] Pager [phone]  If 7PM-7AM, please contact night-coverage [email or web address] Password [facility] 02/18/2019, 8:44 AM

## 2019-02-18 NOTE — Progress Notes (Signed)
ANTICOAGULATION CONSULT NOTE - Follow Up Consult  Pharmacy Consult for Heparin Indication: atrial fibrillation  Allergies  Allergen Reactions  . Tape Other (See Comments)    Pulls skin off    Patient Measurements: Height: 6' (182.9 cm) Weight: 188 lb 0.8 oz (85.3 kg) IBW/kg (Calculated) : 77.6 Heparin Dosing Weight: 85.3 kg  Vital Signs: Temp: 97.7 F (36.5 C) (01/10 2000) Temp Source: Oral (01/10 2000) BP: 102/62 (01/11 0150) Pulse Rate: 114 (01/10 2000)  Labs: Recent Labs    02/15/19 0500 02/15/19 0725 02/16/19 0525 02/16/19 1437 02/17/19 0218 02/17/19 1958 02/18/19 0316  HGB 12.4*  --   --   --  11.5*  --  10.1*  HCT 38.8*  --   --   --  35.9*  --  31.3*  PLT 274  --   --   --  207  --  205  LABPROT  --   --   --  15.6*  --   --   --   INR  --   --   --  1.3*  --   --   --   HEPARINUNFRC  --   --   --   --   --  0.17* 0.74*  CREATININE 10.37*  --  8.43* 4.66* 5.75*  --   --   TROPONINIHS 69* 70*  --   --   --   --   --     Estimated Creatinine Clearance: 17.4 mL/min (A) (by C-G formula based on SCr of 5.75 mg/dL (H)).  Assessment: Anticoag: Heparin for new atrial flutter/afib  2nd shift on 1/10 > heparin level 0.17 low, rate inc to 1500 units/hr>>3rd shift 1/11 HL now up to 0.74 high. Decrease to 1400 units/hr  Goal of Therapy:  Heparin level 0.3-0.7 units/ml Monitor platelets by anticoagulation protocol: Yes   Plan:  Decrease heparin to 1400 units/hr Recheck HL in 6 hrs. Daily HL and CBC   Bernedette Auston S. Alford Highland, PharmD, BCPS Clinical Staff Pharmacist [email or web address] Alford Highland, [employer] 02/18/2019,3:57 AM

## 2019-02-19 ENCOUNTER — Encounter (HOSPITAL_COMMUNITY)
Admission: EM | Disposition: A | Discharge status: Home / Self Care | Payer: Self-pay | Source: Home / Self Care | Attending: Internal Medicine

## 2019-02-19 ENCOUNTER — Inpatient Hospital Stay (HOSPITAL_COMMUNITY): Payer: Medicaid Other | Admitting: Certified Registered Nurse Anesthetist

## 2019-02-19 ENCOUNTER — Encounter (HOSPITAL_COMMUNITY): Payer: Self-pay | Admitting: Family Medicine

## 2019-02-19 ENCOUNTER — Inpatient Hospital Stay (HOSPITAL_COMMUNITY): Payer: Medicaid Other

## 2019-02-19 DIAGNOSIS — I4892 Unspecified atrial flutter: Secondary | ICD-10-CM

## 2019-02-19 DIAGNOSIS — I340 Nonrheumatic mitral (valve) insufficiency: Secondary | ICD-10-CM

## 2019-02-19 HISTORY — PX: TEE WITHOUT CARDIOVERSION: SHX5443

## 2019-02-19 HISTORY — PX: CARDIOVERSION: SHX1299

## 2019-02-19 HISTORY — PX: IR REMOVAL TUN CV CATH W/O FL: IMG2289

## 2019-02-19 LAB — RENAL FUNCTION PANEL
Albumin: 2.6 g/dL — ABNORMAL LOW (ref 3.5–5.0)
Anion gap: 15 (ref 5–15)
BUN: 37 mg/dL — ABNORMAL HIGH (ref 6–20)
CO2: 25 mmol/L (ref 22–32)
Calcium: 7.5 mg/dL — ABNORMAL LOW (ref 8.9–10.3)
Chloride: 99 mmol/L (ref 98–111)
Creatinine, Ser: 7.27 mg/dL — ABNORMAL HIGH (ref 0.61–1.24)
GFR calc Af Amer: 9 mL/min — ABNORMAL LOW (ref >60)
GFR calc non Af Amer: 8 mL/min — ABNORMAL LOW (ref >60)
Glucose, Bld: 93 mg/dL (ref 70–99)
Phosphorus: 5.8 mg/dL — ABNORMAL HIGH (ref 2.5–4.6)
Potassium: 3.8 mmol/L (ref 3.5–5.1)
Sodium: 139 mmol/L (ref 135–145)

## 2019-02-19 LAB — CBC
HCT: 32 % — ABNORMAL LOW (ref 39.0–52.0)
Hemoglobin: 10.3 g/dL — ABNORMAL LOW (ref 13.0–17.0)
MCH: 33.8 pg (ref 26.0–34.0)
MCHC: 32.2 g/dL (ref 30.0–36.0)
MCV: 104.9 fL — ABNORMAL HIGH (ref 80.0–100.0)
Platelets: 226 10*3/uL (ref 150–400)
RBC: 3.05 MIL/uL — ABNORMAL LOW (ref 4.22–5.81)
RDW: 12.6 % (ref 11.5–15.5)
WBC: 7.1 10*3/uL (ref 4.0–10.5)
nRBC: 0 % (ref 0.0–0.2)

## 2019-02-19 LAB — GLUCOSE, CAPILLARY
Glucose-Capillary: 111 mg/dL — ABNORMAL HIGH (ref 70–99)
Glucose-Capillary: 96 mg/dL (ref 70–99)
Glucose-Capillary: 118 mg/dL — ABNORMAL HIGH (ref 70–99)
Glucose-Capillary: 108 mg/dL — ABNORMAL HIGH (ref 70–99)

## 2019-02-19 LAB — HEPARIN LEVEL (UNFRACTIONATED)
Heparin Unfractionated: 0.46 IU/mL (ref 0.30–0.70)
Heparin Unfractionated: 0.25 IU/mL — ABNORMAL LOW (ref 0.30–0.70)

## 2019-02-19 LAB — HEPATITIS B SURFACE ANTIBODY, QUANTITATIVE: Hep B S AB Quant (Post): 357.8 m[IU]/mL (ref >9.9)

## 2019-02-19 SURGERY — ECHOCARDIOGRAM, TRANSESOPHAGEAL
Anesthesia: Monitor Anesthesia Care

## 2019-02-19 MED ORDER — PROPOFOL 500 MG/50ML IV EMUL
INTRAVENOUS | Status: DC | PRN
  Administered 2019-02-19: 100 ug/kg/min via INTRAVENOUS

## 2019-02-19 MED ORDER — CHLORHEXIDINE GLUCONATE CLOTH 2 % EX PADS: 6.0000 | MEDICATED_PAD | Freq: Every day | CUTANEOUS | Status: DC

## 2019-02-19 MED ORDER — PHENYLEPHRINE 40 MCG/ML (10ML) SYRINGE FOR IV PUSH (FOR BLOOD PRESSURE SUPPORT)
PREFILLED_SYRINGE | INTRAVENOUS | Status: DC | PRN
  Administered 2019-02-19: 80 ug via INTRAVENOUS
  Administered 2019-02-19: 120 ug via INTRAVENOUS

## 2019-02-19 MED ORDER — RENA-VITE PO TABS: 1.0000 | ORAL_TABLET | Freq: Every day | ORAL | Status: DC

## 2019-02-19 MED ORDER — SODIUM CHLORIDE 0.9 % IV SOLN: INTRAVENOUS | Status: DC | PRN

## 2019-02-19 NOTE — Progress Notes (Addendum)
Progress Note  Patient Name: Brendan Campbell Date of Encounter: 02/19/2019  Primary Cardiologist: New   Subjective   Denies SOB or chest pain. Has some palpitations with elevated rates.   Inpatient Medications    Scheduled Meds: . aspirin  81 mg Oral Daily  . atorvastatin  10 mg Oral QPM  . calcium acetate  667 mg Oral TID WC  . Chlorhexidine Gluconate Cloth  6 each Topical Q0600  . insulin aspart  0-6 Units Subcutaneous TID WC  . multivitamin  1 tablet Oral Daily  . pantoprazole  40 mg Oral Q0600  . sodium bicarbonate  100 mEq Intravenous Once   Continuous Infusions: . sodium chloride 20 mL/hr at 02/19/19 0400  . amiodarone 30 mg/hr (02/19/19 0736)  . heparin 1,000 Units/hr (02/19/19 0735)   PRN Meds: acetaminophen **OR** acetaminophen, senna-docusate   Vital Signs    Vitals:   02/18/19 1838 02/18/19 2139 02/19/19 0319 02/19/19 0738  BP: 121/85 101/76 104/68 95/77  Pulse: (!) 135 (!) 138 (!) 138 (!) 138  Resp: (!) 22 18 12 17   Temp: 97.7 F (36.5 C) 97.7 F (36.5 C) 97.9 F (36.6 C) 98.3 F (36.8 C)  TempSrc: Oral Oral Oral Oral  SpO2: 99% 99% 99% 99%  Weight:   92.1 kg   Height:        Intake/Output Summary (Last 24 hours) at 02/19/2019 0902 Last data filed at 02/19/2019 0400 Gross per 24 hour  Intake 1143.56 ml  Output --  Net 1143.56 ml   Filed Weights   02/16/19 1111 02/18/19 1515 02/19/19 0319  Weight: 85.3 kg 90.1 kg 92.1 kg    Physical Exam   General: Well developed, well nourished, NAD Neck: Negative for carotid bruits. No JVD Lungs:Clear to ausculation bilaterally. Breathing is unlabored. Cardiovascular: Irregularly irregular with S1 S2. No murmur Extremities: No edema. DP pulses 1+ in left LE Neuro: Alert and oriented. No focal deficits. No facial asymmetry. MAE spontaneously. Psych: Responds to questions appropriately with normal affect.    Labs    Chemistry Recent Labs  Lab 02/15/19 1701 02/16/19 1437 02/17/19 0218  02/18/19 0316  NA 134* 137 137 138  K 6.7* 3.6 4.2 4.1  CL 93* 98 97* 100  CO2 26 22 25 22   GLUCOSE 128* 70 87 92  BUN 51* 27* 35* 49*  CREATININE 7.28* 4.66* 5.75* 7.95*  CALCIUM 8.3* 7.7* 7.5* 7.1*  ALBUMIN 3.8  --  3.0* 2.6*  GFRNONAA 8* 14* 11* 7*  GFRAA 9* 16* 12* 8*  ANIONGAP 15 17* 15 16*     Hematology Recent Labs  Lab 02/15/19 0500 02/17/19 0218 02/18/19 0316  WBC 14.0* 11.3* 7.6  RBC 3.62* 3.41* 2.97*  HGB 12.4* 11.5* 10.1*  HCT 38.8* 35.9* 31.3*  MCV 107.2* 105.3* 105.4*  MCH 34.3* 33.7 34.0  MCHC 32.0 32.0 32.3  RDW 13.0 12.8 12.8  PLT 274 207 205    Cardiac EnzymesNo results for input(s): TROPONINI in the last 168 hours. No results for input(s): TROPIPOC in the last 168 hours.   BNP Recent Labs  Lab 02/15/19 0500  BNP 3,499.0*     DDimer No results for input(s): DDIMER in the last 168 hours.   Radiology    No results found.  Telemetry    02/19/19 AF with rates in the 120's - Personally Reviewed  ECG    Will repeat this AM - Personally Reviewed  Cardiac Studies   Echo 11/03/18  1. Left  ventricular ejection fraction, by visual estimation, is 25 to 30%. The left ventricle has severely decreased function. There is moderately increased left ventricular hypertrophy. 2. Left ventricular diastolic Doppler parameters are indeterminate pattern of LV diastolic filling. 3. Global right ventricle has normal systolic function.The right ventricular size is mildly enlarged. No increase in right ventricular wall thickness. 4. Left atrial size was moderately dilated. 5. Right atrial size was mildly dilated. 6. Mild to moderate aortic valve annular calcification. 7. Mild to moderate mitral annular calcification. 8. The mitral valve is normal in structure. Mild mitral valve regurgitation. No evidence of mitral stenosis. 9. The tricuspid valve is normal in structure. Tricuspid valve regurgitation was not visualized by color flow Doppler. 10. The  aortic valve has an indeterminant number of cusps Aortic valve regurgitation was not visualized by color flow Doppler. Mild aortic valve sclerosis without stenosis. 11. The pulmonic valve was not well visualized. Pulmonic valve regurgitation is not visualized by color flow Doppler. 12. The inferior vena cava is normal in size with greater than 50% respiratory variability, suggesting right atrial pressure of 3 mmHg.  Patient Profile     49 y.o. male who was presented to ED [facility] with SOB after missing HD and admitted with K 7.5 treated with urgent dialysis and IV calcium. He developed tachycardia and was treated with adenosine which showed aflutter and he was started on IV amiodarone.  Assessment & Plan    1. Aflutter RVR -IV amio initiated with brief conversion to NSR  -Telemetry shows persistent AF with rates in the 120 range; repeat EKG today   -Continue amiodarone, Hep gtt  -CHADSVASC= 2 (CHF, DM2) -Not an ideal candidate for long term AC given issues with compliance  -Echo in 09/20 showed EF 20-25% -Plan for TEE/DCCV today   2. Hyperkalemia/ESRD on HD: -Hx of HD non-compliance -Patient underwent emergent HD on presentation  -Management per nephrology/IM  3. Acute on Chronic Systolic HF: -LVEF [id number] in 10/2018 -Suspected ischemic cardiomyopathy>>needs ischemic workup>>likely with Lexiscan stress test after TEE/DCCV for risk stratification  -Volume management per HD -Plan to start low dose BB at discharge if pressures allow  4. HLD: -Plan to give new rx at discharge   Signed, Kathyrn Drown NP-C [place] Pager: [phone] 02/19/2019, 9:02 AM     For questions or updates, please contact   Please consult [email or web address] for contact info under Cardiology/STEMI.   Patient seen and examined with Kathyrn Drown NP-C.  Agree as above, with the following exceptions and changes as noted below. Feels well overall but hopes for restoration of SR. Gen: NAD, CV: regular tachycardia,  no murmurs, Lungs: clear, Abd: soft, Extrem: Warm, well perfused, no edema, Neuro/Psych: alert and oriented x 3, normal mood and affect. All available labs, radiology testing, previous records reviewed. Plan for TEE DCCV today. Patient has been consented and we discussed risks, benefits, alternatives. Pt also plans to have temporary dialysis line removed.   After careful review of history and examination, the risks and benefits of transesophageal echocardiogram have been explained including risks of esophageal damage, perforation (1:10,000 risk), bleeding, pharyngeal hematoma as well as other potential complications associated with conscious sedation including aspiration, arrhythmia, respiratory failure and death. Alternatives to treatment were discussed, questions were answered. Patient is willing to proceed. Risks, benefits and alternatives of direct current cardioversion reviewed including potential for post-cardioversion rhythms, especially life-threatening arrhythmias (ventricular tachycardia and fibrillation, profound bradycardia). Major complications may include serious or fatal arrhythmias, myocardial damage, and acute pulmonary edema; minor complications  include skin burns and transient hypotension. Benefits include restoration of sinus rhythm. Alternatives to treatment were discussed, questions were answered. Patient is willing to proceed.   Elouise Munroe 02/19/19 7:22 PM

## 2019-02-19 NOTE — Progress Notes (Signed)
  [facility] Progress Note   Subjective:  Seen in room, no c/o today, we used his AVG yesterday.    Objective Vitals:   02/19/19 1140 02/19/19 1305 02/19/19 1315 02/19/19 1325  BP: 94/78 (!) 131/48 (!) 131/44 (!) 118/58  Pulse: (!) 130 77 76 78  Resp: 15 13 (!) 21 20  Temp: 97.7 F (36.5 C) 97.9 F (36.6 C)    TempSrc: Oral Oral    SpO2: 99% 97% 98% 99%  Weight:      Height:       Physical Exam General: Well appearing, NAD Heart: Tachycardic, no murmur Lungs: CTA anteriorly Abdomen: soft, non-tender Extremities: No LE edema Dialysis Access: LUE AVG + bruit, new [facility]  Additional Objective Labs: Basic Metabolic Panel: Recent Labs  Lab 02/17/19 0218 02/18/19 0316 02/19/19 0838  NA 137 138 139  K 4.2 4.1 3.8  CL 97* 100 99  CO2 25 22 25   GLUCOSE 87 92 93  BUN 35* 49* 37*  CREATININE 5.75* 7.95* 7.27*  CALCIUM 7.5* 7.1* 7.5*  PHOS 6.8* 8.0* 5.8*   Liver Function Tests: Recent Labs  Lab 02/17/19 0218 02/18/19 0316 02/19/19 0838  ALBUMIN 3.0* 2.6* 2.6*   CBC: Recent Labs  Lab 02/15/19 0500 02/17/19 0218 02/18/19 0316 02/19/19 0838  WBC 14.0* 11.3* 7.6 7.1  NEUTROABS 12.4*  --   --   --   HGB 12.4* 11.5* 10.1* 10.3*  HCT 38.8* 35.9* 31.3* 32.0*  MCV 107.2* 105.3* 105.4* 104.9*  PLT 274 207 205 226    Medications: . sodium chloride Stopped (02/19/19 1326)  . amiodarone 30 mg/hr (02/19/19 1229)  . heparin 1,000 Units/hr (02/19/19 0735)   . [MAR Hold] aspirin  81 mg Oral Daily  . [MAR Hold] atorvastatin  10 mg Oral QPM  . [MAR Hold] calcium acetate  667 mg Oral TID WC  . [MAR Hold] Chlorhexidine Gluconate Cloth  6 each Topical Q0600  . [MAR Hold] insulin aspart  0-6 Units Subcutaneous TID WC  . [START ON 02/20/2019] multivitamin  1 tablet Oral QHS  . [MAR Hold] pantoprazole  40 mg Oral Q0600  . [MAR Hold] sodium bicarbonate  100 mEq Intravenous Once    Dialysis Orders: [facility] MWF  4h 85min  87.5kg   1K/2.25 bath  Hep  1000+ 500u/hr  LUE AVG  300/600   - prosthetic weighs 1.6 kg  - Hectoral 0.50mcg IV/HD   - Epogen 600Units IV/HD   Assessment/Plan: 1. Hyperkalemia and volume overload: Due to noncompliance with HD. S/p urgent HD with low K bath at [facility]. Unfortunately, K remained high - transferred to [facility] - improved with HD 1/9. 2. SVT (new onset): Developed 1/9 after [facility] placement. Repeat K normal. S/p adenosine without effect, then amio x 3 and on drip. Now hypotensive. Cardiology following and planning for TEE/ DCCV today.  3. ESRD: Usual MWF sched. H/o thrombectomy in October, otherwise pt states is used at OP HD w/o much difficulty. AVG stuck here yesterday w/o difficulty. Will consult IR for TDC removal.  4. Hypertension/volume: BP's soft, below dry wt 5. Anemia: Hgb > 12, no ESA needed. 6. Metabolic bone disease: CorrCa ok, Phos ^.  Unclear home binder - start Phoslo 1/meals. 7. Nutrition- Renal diet  Kelly Splinter, MD   [place] 02/19/2019, 1:36 PM

## 2019-02-19 NOTE — Procedures (Signed)
PROCEDURE SUMMARY:  Successful removal of tunneled right IJ HD catheter. No immediate complications.  EBL < 10 cc. Patient tolerated well.  Pressure dressing (gauze and tegaderm) applied to site. Please see imaging section of Epic for full dictation.   Claris Pong Ahmiyah Coil PA-C 02/19/2019 2:20 PM

## 2019-02-19 NOTE — Progress Notes (Signed)
  Echocardiogram Echocardiogram Transesophageal has been performed.  Darlina Sicilian M 02/19/2019, 1:04 PM

## 2019-02-19 NOTE — Progress Notes (Signed)
PROGRESS NOTE    Brendan Campbell  [id number] DOB: [birth year] DOA: 02/15/2019 PCP: Patient, No Pcp Per   Brief Narrative:  49 YO man coming to ER with hyperkalemiawith EKG changes,volume overload, acute on chronic systolic heart failuredue to noncompliance with dialysis. He underwent urgent dialysis status post tunneled catheter placement by IR on 1/9th Postprocedure he developed SVT with stable blood pressure, then A fib/flutter. Now s/p TEE, cardioversion, removal of temporary dialysis catheter  Assessment & Plan:   Principal Problem:   Hyperkalemia Active Problems:   DM2 (diabetes mellitus, type 2) (HCC)   HLD (hyperlipidemia)   S/P below knee amputation, right (HCC)   Anemia due to end stage renal disease (HCC)   Weakness   Acute pulmonary edema (HCC)   ESRD on hemodialysis (HCC)   CHF (congestive heart failure) (HCC)   Neuropathy   Peripheral vascular disease (HCC)   Abnormal EKG   Leukocytosis   Atypical pneumonia  A fib/flutter: -SVT after tunneled catheter, failed to respond to adenosine times 2, converted to sinus after amiodarone and then into A fib/flutter s/p cardioversion with TEE Per recommendations -heparin drip for anticoagulation -amiodarone drip  Hyperkalemiawith EKG changes,volume overload, acute on chronic systolic heart failuredue to noncompliance with dialysis. -per nephrology "He chronically skips his Wednesday dialysis despite multiple admissions for pulmonary edema/volume overload and hyperkalemia" -On presentation,his K was >7.5,urgent dialysis with response of symptoms -Nephrology input appreciated -continue HD while inpatient--for HD on tomorrow  AVG malfunction: -due to not able to cannulate left follow-up for extremity AV graft, afemoral temporary HD catheterwas placedin the ED for urgent dialysis -IR consulted for tunneled dialysis catheter placementon 1/9-->removed on 02/19/2019 AVF catheter worked well on 04/11 HD  ESRD  On HD MWF/Anemia of chronic disease Oliguria Follow-up with nephrology  Acute on chronic systolic heart failure /pulmonary edema Left ventricular EF 25 to 30% on diet echocardiogram obtained in September 2020 Presentswith pulmonary edema,resolved after urgent dialysis,repeat chest x-rayisclear   Insulin-dependent type 2 diabetes,fairly controlled -Continue home dose insulin -Follow-up with PCP  Hyperlipidemia Report run out statin,new prescription provided at discharge Advised patient to follow-up with his PCP  PVDStatus post right BKA -Report baseline independent,lives with a roommate.   DVT prophylaxis: Heparin gtt Code Status: Full Family Communication: patient Disposition Plan: once off gtts and stable from cards point of view   Consultants:   Cardiology  IR  Nephology  Procedures:   TEE  Cardioversion  Temporary dialysis catheter placement and removal   Antimicrobials: none   Subjective: Feels better, anxious to go home.  Objective: Vitals:   02/19/19 1305 02/19/19 1315 02/19/19 1325 02/19/19 1341  BP: (!) 131/48 (!) 131/44 (!) 118/58 (!) 124/51  Pulse: 77 76 78 78  Resp: 13 (!) 21 20 15   Temp: 97.9 F (36.6 C)   97.8 F (36.6 C)  TempSrc: Oral   Oral  SpO2: 97% 98% 99% 100%  Weight:      Height:        Intake/Output Summary (Last 24 hours) at 02/19/2019 1722 Last data filed at 02/19/2019 1500 Gross per 24 hour  Intake 903.24 ml  Output --  Net 903.24 ml   Filed Weights   02/16/19 1111 02/18/19 1515 02/19/19 0319  Weight: 85.3 kg 90.1 kg 92.1 kg    Examination:  General exam: Appears calm and comfortable  Respiratory system: Clear to auscultation. Respiratory effort normal. Cardiovascular system: tachy Gastrointestinal system: Abdomen is nondistended, soft and nontender.  Central nervous system: Alert and  oriented. No focal neurological deficits. Extremities: right BKA Skin: No rashes, lesions or  ulcers Psychiatry: Judgement and insight appear normal. Mood & affect appropriate.     Data Reviewed: I have personally reviewed following labs and imaging studies  CBC: Recent Labs  Lab 02/15/19 0500 02/17/19 0218 02/18/19 0316 02/19/19 0838  WBC 14.0* 11.3* 7.6 7.1  NEUTROABS 12.4*  --   --   --   HGB 12.4* 11.5* 10.1* 10.3*  HCT 38.8* 35.9* 31.3* 32.0*  MCV 107.2* 105.3* 105.4* 104.9*  PLT 274 207 205 [id number]   Basic Metabolic Panel: Recent Labs  Lab 02/15/19 1701 02/16/19 0525 02/16/19 1437 02/17/19 0218 02/18/19 0316 02/19/19 0838  NA 134* 137 137 137 138 139  K 6.7* 5.3* 3.6 4.2 4.1 3.8  CL 93* 93* 98 97* 100 99  CO2 26 25 22 25 22 25   GLUCOSE 128* 82 70 87 92 93  BUN 51* 61* 27* 35* 49* 37*  CREATININE 7.28* 8.43* 4.66* 5.75* 7.95* 7.27*  CALCIUM 8.3* 7.6* 7.7* 7.5* 7.1* 7.5*  PHOS 6.3*  --   --  6.8* 8.0* 5.8*   GFR: Estimated Creatinine Clearance: 13.8 mL/min (A) (by C-G formula based on SCr of 7.27 mg/dL (H)). Liver Function Tests: Recent Labs  Lab 02/15/19 1701 02/17/19 0218 02/18/19 0316 02/19/19 0838  ALBUMIN 3.8 3.0* 2.6* 2.6*   Coagulation Profile: Recent Labs  Lab 02/16/19 1437  INR 1.3*   CBG: Recent Labs  Lab 02/18/19 1844 02/18/19 2135 02/19/19 0620 02/19/19 1111 02/19/19 1634  GLUCAP 98 107* 111* 96 118*   Sepsis Labs:  Recent Results (from the past 240 hour(s))  SARS CORONAVIRUS 2 (TAT 6-24 HRS) Nasopharyngeal Nasopharyngeal Swab     Status: None   Collection Time: 02/15/19  4:30 AM   Specimen: Nasopharyngeal Swab  Result Value Ref Range Status   SARS Coronavirus 2 NEGATIVE NEGATIVE Final    Comment: (NOTE) SARS-CoV-2 target nucleic acids are NOT DETECTED. The SARS-CoV-2 RNA is generally detectable in upper and lower respiratory specimens during the acute phase of infection. Negative results do not preclude SARS-CoV-2 infection, do not rule out co-infections with other pathogens, and should not be used as the sole basis  for treatment or other patient management decisions. Negative results must be combined with clinical observations, patient history, and epidemiological information. The expected result is Negative. Fact Sheet for Patients: SugarRoll.be Fact Sheet for Healthcare Providers: https://www.woods-mathews.com/ This test is not yet approved or cleared by the [place] FDA and  has been authorized for detection and/or diagnosis of SARS-CoV-2 by FDA under an Emergency Use Authorization (EUA). This EUA will remain  in effect (meaning this test can be used) for the duration of the COVID-19 declaration under Section 56 4(b)(1) of the Act, 21 U.S.C. section 360bbb-3(b)(1), unless the authorization is terminated or revoked sooner. Performed at [facility], [place] [address] [place], [place] [address]   Respiratory Panel by RT PCR (Flu A&B, Covid) - Nasopharyngeal Swab     Status: None   Collection Time: 02/15/19  6:05 AM   Specimen: Nasopharyngeal Swab  Result Value Ref Range Status   SARS Coronavirus 2 by RT PCR NEGATIVE NEGATIVE Final    Comment: (NOTE) SARS-CoV-2 target nucleic acids are NOT DETECTED. The SARS-CoV-2 RNA is generally detectable in upper respiratoy specimens during the acute phase of infection. The lowest concentration of SARS-CoV-2 viral copies this assay can detect is 131 copies/mL. A negative result does not preclude SARS-Cov-2 infection and  should not be used as the sole basis for treatment or other patient management decisions. A negative result may occur with  improper specimen collection/handling, submission of specimen other than nasopharyngeal swab, presence of viral mutation(s) within the areas targeted by this assay, and inadequate number of viral copies (<131 copies/mL). A negative result must be combined with clinical observations, patient history, and epidemiological information. The expected result is  Negative. Fact Sheet for Patients:  PinkCheek.be Fact Sheet for Healthcare Providers:  GravelBags.it This test is not yet ap proved or cleared by the [place] FDA and  has been authorized for detection and/or diagnosis of SARS-CoV-2 by FDA under an Emergency Use Authorization (EUA). This EUA will remain  in effect (meaning this test can be used) for the duration of the COVID-19 declaration under Section 564(b)(1) of the Act, 21 U.S.C. section 360bbb-3(b)(1), unless the authorization is terminated or revoked sooner.    Influenza A by PCR NEGATIVE NEGATIVE Final   Influenza B by PCR NEGATIVE NEGATIVE Final    Comment: (NOTE) The Xpert Xpress SARS-CoV-2/FLU/RSV assay is intended as an aid in  the diagnosis of influenza from Nasopharyngeal swab specimens and  should not be used as a sole basis for treatment. Nasal washings and  aspirates are unacceptable for Xpert Xpress SARS-CoV-2/FLU/RSV  testing. Fact Sheet for Patients: PinkCheek.be Fact Sheet for Healthcare Providers: GravelBags.it This test is not yet approved or cleared by the [place] FDA and  has been authorized for detection and/or diagnosis of SARS-CoV-2 by  FDA under an Emergency Use Authorization (EUA). This EUA will remain  in effect (meaning this test can be used) for the duration of the  Covid-19 declaration under Section 564(b)(1) of the Act, 21  U.S.C. section 360bbb-3(b)(1), unless the authorization is  terminated or revoked. Performed at [facility], [address], [place], [place] [address]      Radiology Studies: IR Removal Tun Cv Cath W/O FL  Result Date: 02/19/2019 INDICATION: Patient with history of ESRD on HD s/p tunneled right IJ HD catheter placement in IR 02/16/2019 by Dr. Anselm Pancoast. Patient now with functioning left AV graft. Request is made for removal of tunneled hemodialysis  catheter. EXAM: REMOVAL OF TUNNELED HEMODIALYSIS CATHETER MEDICATIONS: None COMPLICATIONS: None immediate. PROCEDURE: Informed written consent was obtained from the patient following an explanation of the procedure, risks, benefits and alternatives to treatment. A time out was performed prior to the initiation of the procedure. Maximal barrier sterile technique was utilized including mask, sterile gloves, hand hygiene, and Betadine. Utilizing gentle traction, the catheter was removed intact. Hemostasis was obtained with manual compression. A dressing was placed. The patient tolerated the procedure well without immediate post procedural complication. IMPRESSION: Successful removal of tunneled dialysis catheter. Read by: Earley Abide, PA-C Electronically Signed   By: Sandi Mariscal M.D.   On: 02/19/2019 14:26   ECHO TEE  Result Date: 02/19/2019   TRANSESOPHOGEAL ECHO REPORT   Patient Name:   LIONAL NICKLAUS Date of Exam: 02/19/2019 Medical Rec #:  [id number]      Height:       72.0 in Accession #:    [id number]     Weight:       203.0 lb Date of Birth:  Nov 17, 1970       BSA:          2.14 m Patient Age:    51 years       BP:           94/78  mmHg Patient Gender: M              HR:           138 bpm. Exam Location:  Inpatient  Procedure: Transesophageal Echo, Color Doppler and Cardiac Doppler Indications:     Atrial flutter 427.32 / I48.92  History:         Patient has prior history of Echocardiogram examinations, most                  recent 11/03/2018. Chronic kidney disease. anemia.  Sonographer:     Darlina Sicilian RDCS Referring Phys:  [id number] [place] Diagnosing Phys: Lyman Bishop MD  PROCEDURE: The TEE was followed by a successful Cardioversion. No evidence of intracardiac thrombus. Patients was monitored while under deep sedation. The transesophogeal probe was passed through the esophogus of the patient. Imaged were obtained with the patient in a supine position. Image quality was excellent. The patient's  vital signs; including heart rate, blood pressure, and oxygen saturation; remained stable throughout the procedure. The patient developed no complications during the procedure. IMPRESSIONS  1. Left ventricular ejection fraction, by visual estimation, is 20 to 25%. The left ventricle has severely decreased function. There is no left ventricular hypertrophy.  2. The left ventricle demonstrates global hypokinesis.  3. Severe hypokinesis of the left ventricular, entire anterior wall.  4. Global right ventricle has mildly reduced systolic function.The right ventricular size is normal. No increase in right ventricular wall thickness.  5. Left atrial size was moderately dilated.  6. Right atrial size was mildly dilated.  7. The aortic valve is tricuspid. Aortic valve regurgitation is not visualized.  8. The mitral valve is grossly normal. Mild mitral valve regurgitation.  9. The tricuspid valve is grossly normal. 10. The pulmonic valve was grossly normal. Pulmonic valve regurgitation is trivial. 11. The dialysis catheter tip was not visualized in the RA. 12. No intracardiac thrombi or masses were visualized. FINDINGS  Left Ventricle: Left ventricular ejection fraction, by visual estimation, is 20 to 25%. The left ventricle has severely decreased function. Severe hypokinesis of the left ventricular, entire anterior wall. The left ventricle demonstrates global hypokinesis. There is no left ventricular hypertrophy. Right Ventricle: The right ventricular size is normal. No increase in right ventricular wall thickness. Global RV systolic function is has mildly reduced systolic function. Left Atrium: Left atrial size was moderately dilated. Right Atrium: Right atrial size was mildly dilated The dialysis catheter tip was not visualized in the RA. Pericardium: There is no evidence of pericardial effusion. Mitral Valve: The mitral valve is grossly normal. Mild mitral valve regurgitation. Tricuspid Valve: The tricuspid valve is  grossly normal. Tricuspid valve regurgitation is trivial. Aortic Valve: The aortic valve is tricuspid. Aortic valve regurgitation is not visualized. Pulmonic Valve: The pulmonic valve was grossly normal. Pulmonic valve regurgitation is trivial. Aorta: The aortic root and ascending aorta are structurally normal, with no evidence of dilitation. Shunts: No atrial level shunt detected by color flow Doppler. Additional Comments: No intracardiac thrombi or masses were visualized.  Lyman Bishop MD Electronically signed by Lyman Bishop MD Signature Date/Time: 02/19/2019/1:15:35 PM    Final     Scheduled Meds: . aspirin  81 mg Oral Daily  . atorvastatin  10 mg Oral QPM  . calcium acetate  667 mg Oral TID WC  . [START ON 02/20/2019] Chlorhexidine Gluconate Cloth  6 each Topical Q0600  . insulin aspart  0-6 Units Subcutaneous TID WC  . [START ON  02/20/2019] multivitamin  1 tablet Oral QHS  . pantoprazole  40 mg Oral Q0600   Continuous Infusions: . amiodarone 30 mg/hr (02/19/19 1229)  . heparin 1,000 Units/hr (02/19/19 0735)     LOS: 4 days   Donnamae Jude, MD [facility] Pager [phone]  If 7PM-7AM, please contact night-coverage [email or web address] Password [facility] 02/19/2019, 5:22 PM

## 2019-02-19 NOTE — Progress Notes (Signed)
Pt received from endo. VSS. Heart rhythm NSR in 70's. Call light in reach.  Clyde Canterbury, RN

## 2019-02-19 NOTE — Anesthesia Preprocedure Evaluation (Addendum)
Anesthesia Evaluation  Patient identified by MRN, date of birth, ID band Patient awake    Reviewed: Allergy & Precautions, NPO status , Patient's Chart, lab work & pertinent test results  History of Anesthesia Complications Negative for: history of anesthetic complications  Airway Mallampati: I  TM Distance: >3 FB Neck ROM: Full    Dental  (+) Edentulous Upper, Poor Dentition, Missing, Dental Advisory Given, Chipped   Pulmonary neg pulmonary ROS, former smoker (quit '08),  02/15/2019 SARS coronavirus NEG   breath sounds clear to auscultation       Cardiovascular (-) hypertension+ Peripheral Vascular Disease   Rhythm:Irregular Rate:Normal  10/2018 ECHO: EF 25-30%, Mild to moderate mitral annular calcification. Mild MR. No evidence of MS. Mild aortic valve sclerosis without stenosis   Neuro/Psych PSYCHIATRIC DISORDERS (PTSD) Anxiety Depression negative neurological ROS     GI/Hepatic negative GI ROS, Neg liver ROS,   Endo/Other  diabetes (glu 96), Insulin Dependent  Renal/GU Dialysis and ESRFRenal disease (K+ 3.8 )     Musculoskeletal   Abdominal   Peds  Hematology negative hematology ROS (+)   Anesthesia Other Findings   Reproductive/Obstetrics                            Anesthesia Physical Anesthesia Plan  ASA: III  Anesthesia Plan: MAC   Post-op Pain Management:    Induction:   PONV Risk Score and Plan: 1 and Treatment may vary due to age or medical condition  Airway Management Planned: Natural Airway and Nasal Cannula  Additional Equipment:   Intra-op Plan:   Post-operative Plan:   Informed Consent: I have reviewed the patients History and Physical, chart, labs and discussed the procedure including the risks, benefits and alternatives for the proposed anesthesia with the patient or authorized representative who has indicated his/her understanding and acceptance.     Dental  advisory given  Plan Discussed with: CRNA and Surgeon  Anesthesia Plan Comments:        Anesthesia Quick Evaluation

## 2019-02-19 NOTE — H&P (Signed)
   INTERVAL PROCEDURE H&P  History and Physical Interval Note:  02/19/2019 11:51 AM  Brendan Campbell has presented today for their planned procedure. The various methods of treatment have been discussed with the patient and family. After consideration of risks, benefits and other options for treatment, the patient has consented to the procedure.  The patients' outpatient history has been reviewed, patient examined, and no change in status from most recent office note within the past 30 days. I have reviewed the patients' chart and labs and will proceed as planned. Questions were answered to the patient's satisfaction.   Brendan Casino, MD, [facility], [place] Director of the Advanced Lipid Disorders &  Cardiovascular Risk Reduction Clinic Diplomate of the American Board of Clinical Lipidology Attending Cardiologist  Direct Dial: [phone]  Fax: [phone]  Website:  [email or web address] Brendan Campbell 02/19/2019, 11:51 AM

## 2019-02-19 NOTE — Progress Notes (Addendum)
[place] for Heparin Indication: atrial fibrillation  Allergies  Allergen Reactions  . Tape Other (See Comments)    Pulls skin off    Patient Measurements: Height: 6' (182.9 cm) Weight: 203 lb 0.7 oz (92.1 kg) IBW/kg (Calculated) : 77.6 Heparin Dosing Weight: 85.3 kg  Vital Signs: Temp: 97.5 F (36.4 C) (01/12 1116) Temp Source: Oral (01/12 1116) BP: 109/97 (01/12 1116) Pulse Rate: 36 (01/12 1116)  Labs: Recent Labs    02/16/19 1437 02/16/19 1437 02/17/19 0218 02/18/19 0316 02/18/19 0959 02/18/19 2125 02/19/19 0838  HGB  --    < > 11.5* 10.1*  --   --  10.3*  HCT  --   --  35.9* 31.3*  --   --  32.0*  PLT  --   --  207 205  --   --  226  LABPROT 15.6*  --   --   --   --   --   --   INR 1.3*  --   --   --   --   --   --   HEPARINUNFRC  --    < >  --  0.74* 0.86* 0.72* 0.46  CREATININE 4.66*  --  5.75* 7.95*  --   --  7.27*   < > = values in this interval not displayed.    Estimated Creatinine Clearance: 13.8 mL/min (A) (by C-G formula based on SCr of 7.27 mg/dL (H)).   Medical History: Past Medical History:  Diagnosis Date  . Anemia   . Anxiety   . Blood transfusion without reported diagnosis   . CHF (congestive heart failure) ([place])   . Chronic kidney disease   . Depression   . Diabetes mellitus without complication ([place])   . End stage renal disease ([place])    M/W/F [facility]  . Hyperlipidemia   . Neuropathy   . Peripheral vascular disease ([place])   . PTSD (post-traumatic stress disorder)     Assessment: 49 yo M presents with new atrial flutter with RVR (CHADS-VASc 3) starting IV heparin. No AC PTA noted. Patient had tunneled HD cath placed 1/9. Procedure was complicated with asymptomatic SVT that was treated with adenosine which revealed atrial flutter. Currently on amiodarone drip after s/p amio bolus x3.   ESRD with HD MWF. Patient's heparin level is therapeutic at 0.46 on heparin rate of 1,000 units/hr. No  known issues with IV infusion.  No overt bleeding or complications noted. Hg is stable at 10.3. Plts are wnls.  Goal of Therapy:  Heparin level 0.3-0.7 units/ml Monitor platelets by anticoagulation protocol: Yes   Plan:  - Continue IV heparin at 1000 units/hr - Recheck heparin level in 8 hrs. - Daily heparin level and CBC. - Monitor for signs/symptoms of bleeding  Sherren Kerns, PharmD PGY1 Acute Care Pharmacy Resident  02/19/2019 11:28 AM

## 2019-02-19 NOTE — CV Procedure (Signed)
TEE/CARDIOVERSION NOTE  TRANSESOPHAGEAL ECHOCARDIOGRAM (TEE):  Indictation: Atrial Flutter  Consent:   Informed consent was obtained prior to the procedure. The risks, benefits and alternatives for the procedure were discussed and the patient comprehended these risks.  Risks include, but are not limited to, cough, sore throat, vomiting, nausea, somnolence, esophageal and stomach trauma or perforation, bleeding, low blood pressure, aspiration, pneumonia, infection, trauma to the teeth and death.    Time Out: Verified patient identification, verified procedure, site/side was marked, verified correct patient position, special equipment/implants available, medications/allergies/relevent history reviewed, required imaging and test results available. Performed  Procedure:  After a procedural time-out, the patient was given propofol for sedation by the anesthesia staff. The patient's heart rate, blood pressure, and oxygen saturation are monitored continuously during the procedure. The transesophageal probe was inserted in the esophagus and stomach without difficulty and multiple views were obtained. Agitated microbubble saline contrast was not administered.  Complications:    Complications: None Patient did tolerate procedure well.  Findings:  1. LEFT VENTRICLE: The left ventricular wall thickness is normal.  The left ventricular cavity is dilated in size. Wall motion is globally hypokinetic with severe anterior hypokinesis.  LVEF is 20-25%.  2. RIGHT VENTRICLE:  The right ventricle is dilated and mildly hypokinetic.    3. LEFT ATRIUM:  The left atrium is moderately dilated in size without any thrombus or masses.  There is not spontaneous echo contrast ("smoke") in the left atrium consistent with a low flow state.  4. LEFT ATRIAL APPENDAGE:  The left atrial appendage is free of any thrombus or masses. The appendage has single lobes. Pulse doppler indicates low flow in the  appendage.  5. ATRIAL SEPTUM:  The atrial septum appears intact and is free of thrombus and/or masses.  There is no evidence for interatrial shunting by color doppler.  6. RIGHT ATRIUM:  The right atrium is mildly dilated in size and function without any thrombus or masses.  7. MITRAL VALVE:  The mitral valve is normal in structure and function with Mild regurgitation.  There were no vegetations or stenosis.  8. AORTIC VALVE:  The aortic valve is trileaflet, normal in structure and function with no regurgitation.  There were no vegetations or stenosis  9. TRICUSPID VALVE:  The tricuspid valve is normal in structure and function with trivial regurgitation.  There were no vegetations or stenosis  10.  PULMONIC VALVE:  The pulmonic valve is normal in structure and function with no regurgitation.  There were no vegetations or stenosis.   11. AORTIC ARCH, ASCENDING AND DESCENDING AORTA:  There was no Ron Parker et. Al, 1992) atherosclerosis of the ascending aorta, aortic arch, or proximal descending aorta.  12. PULMONARY VEINS: Anomalous pulmonary venous return was not noted.  13. PERICARDIUM: The pericardium appeared normal and non-thickened.  There is no pericardial effusion.  CARDIOVERSION:     Second Time Out: Verified patient identification, verified procedure, site/side was marked, verified correct patient position, special equipment/implants available, medications/allergies/relevent history reviewed, required imaging and test results available.  Performed  Procedure:  1. Patient placed on cardiac monitor, pulse oximetry, supplemental oxygen as necessary.  2. Sedation administered per anesthesia 3. Pacer pads placed anterior and posterior chest. 4. Cardioverted 1 time(s).  5. Cardioverted at 150J biphasic.  Complications:  Complications: None Patient did tolerate procedure well.  Impression:  1. No LAA thrombus 2. Negative for PFO by color doppler 3. Global hypokinesis with more  severe anterior hypokinesis, LVEF 20-25% 4. Successful DCCV  to NSR with a single 150J biphasic shock  Recommendations:  1. Continue IV amiodarone and heparin.   Time Spent Directly with the Patient:  45 minutes   [name], MD, [facility], [place] Director of the [facility]  [facility] Diplomate of the American Board of Clinical Lipidology Attending Cardiologist  Direct Dial: [phone]  Fax: [phone]  Website:  [email or web address] 02/19/2019, 12:57 PM

## 2019-02-19 NOTE — Anesthesia Postprocedure Evaluation (Signed)
Anesthesia Post Note  Patient: Brendan Campbell  Procedure(s) Performed: TRANSESOPHAGEAL ECHOCARDIOGRAM (TEE) (N/A ) CARDIOVERSION (N/A )     Patient location during evaluation: Endoscopy Anesthesia Type: MAC Level of consciousness: awake and alert, patient cooperative and oriented Pain management: pain level controlled Vital Signs Assessment: post-procedure vital signs reviewed and stable Respiratory status: spontaneous breathing, nonlabored ventilation and respiratory function stable Cardiovascular status: blood pressure returned to baseline and stable Postop Assessment: no apparent nausea or vomiting Anesthetic complications: no    Last Vitals:  Vitals:   02/19/19 1325 02/19/19 1341  BP: (!) 118/58 (!) 124/51  Pulse: 78 78  Resp: 20 15  Temp:  36.6 C  SpO2: 99% 100%    Last Pain:  Vitals:   02/19/19 1341  TempSrc: Oral  PainSc:                  Ailanie Ruttan,E. [place] Welliver

## 2019-02-19 NOTE — Anesthesia Procedure Notes (Signed)
Procedure Name: MAC Date/Time: 02/19/2019 12:32 PM Performed by: Harden Mo, CRNA Pre-anesthesia Checklist: Patient identified, Emergency Drugs available, Suction available and Patient being monitored Patient Re-evaluated:Patient Re-evaluated prior to induction Oxygen Delivery Method: Nasal cannula Preoxygenation: Pre-oxygenation with 100% oxygen Induction Type: IV induction Placement Confirmation: positive ETCO2 and breath sounds checked- equal and bilateral Dental Injury: Teeth and Oropharynx as per pre-operative assessment

## 2019-02-19 NOTE — Progress Notes (Signed)
[facility] for Heparin Indication: atrial fibrillation  Allergies  Allergen Reactions  . Tape Other (See Comments)    Pulls skin off    Patient Measurements: Height: 6' (182.9 cm) Weight: 203 lb 0.7 oz (92.1 kg) IBW/kg (Calculated) : 77.6 Heparin Dosing Weight: 85.3 kg  Vital Signs: Temp: 97.8 F (36.6 C) (01/12 1341) Temp Source: Oral (01/12 1341) BP: 124/51 (01/12 1341) Pulse Rate: 78 (01/12 1341)  Labs: Recent Labs    02/17/19 0218 02/18/19 0316 02/18/19 2125 02/19/19 0838 02/19/19 1613  HGB 11.5* 10.1*  --  10.3*  --   HCT 35.9* 31.3*  --  32.0*  --   PLT 207 205  --  226  --   HEPARINUNFRC  --  0.74* 0.72* 0.46 0.25*  CREATININE 5.75* 7.95*  --  7.27*  --     Estimated Creatinine Clearance: 13.8 mL/min (A) (by C-G formula based on SCr of 7.27 mg/dL (H)).   Medical History: Past Medical History:  Diagnosis Date  . Anemia   . Anxiety   . Blood transfusion without reported diagnosis   . CHF (congestive heart failure) ([place])   . Chronic kidney disease   . Depression   . Diabetes mellitus without complication ([place])   . End stage renal disease ([place])    M/W/F [facility]  . Hyperlipidemia   . Neuropathy   . Peripheral vascular disease ([place])   . PTSD (post-traumatic stress disorder)     Assessment: 49 yo M presents with new atrial flutter with RVR (CHADS-VASc 3) starting IV heparin. No AC PTA noted. Patient had tunneled HD cath placed 1/9. Procedure was complicated with asymptomatic SVT that was treated with adenosine which revealed atrial flutter. Currently on amiodarone drip after s/p amio bolus x3.   ESRD with HD MWF.  Heparin level 0.25 this PM  Goal of Therapy:  Heparin level 0.3-0.7 units/ml Monitor platelets by anticoagulation protocol: Yes   Plan:  - Increase heparin to 1100 units / hr - Daily heparin level and CBC. - Monitor for signs/symptoms of bleeding  Thank you Anette Guarneri, PharmD   02/19/2019 5:17 PM

## 2019-02-19 NOTE — Transfer of Care (Signed)
Immediate Anesthesia Transfer of Care Note  Patient: Brendan Campbell  Procedure(s) Performed: TRANSESOPHAGEAL ECHOCARDIOGRAM (TEE) (N/A ) CARDIOVERSION (N/A )  Patient Location: Endoscopy Unit  Anesthesia Type:General  Level of Consciousness: awake and alert   Airway & Oxygen Therapy: Patient Spontanous Breathing and Patient connected to nasal cannula oxygen  Post-op Assessment: Report given to RN, Post -op Vital signs reviewed and stable and Patient moving all extremities X 4  Post vital signs: Reviewed and stable  Last Vitals:  Vitals Value Taken Time  BP    Temp    Pulse    Resp    SpO2      Last Pain:  Vitals:   02/19/19 1140  TempSrc: Oral  PainSc: 0-No pain      Patients Stated Pain Goal: 0 ([id number])  Complications: No apparent anesthesia complications

## 2019-02-20 ENCOUNTER — Encounter: Payer: Self-pay | Admitting: *Deleted

## 2019-02-20 LAB — RENAL FUNCTION PANEL
Albumin: 2.8 g/dL — ABNORMAL LOW (ref 3.5–5.0)
Anion gap: 19 — ABNORMAL HIGH (ref 5–15)
BUN: 52 mg/dL — ABNORMAL HIGH (ref 6–20)
CO2: 21 mmol/L — ABNORMAL LOW (ref 22–32)
Calcium: 7.8 mg/dL — ABNORMAL LOW (ref 8.9–10.3)
Chloride: 97 mmol/L — ABNORMAL LOW (ref 98–111)
Creatinine, Ser: 8.88 mg/dL — ABNORMAL HIGH (ref 0.61–1.24)
GFR calc Af Amer: 7 mL/min — ABNORMAL LOW (ref >60)
GFR calc non Af Amer: 6 mL/min — ABNORMAL LOW (ref >60)
Glucose, Bld: 111 mg/dL — ABNORMAL HIGH (ref 70–99)
Phosphorus: 6.5 mg/dL — ABNORMAL HIGH (ref 2.5–4.6)
Potassium: 4.6 mmol/L (ref 3.5–5.1)
Sodium: 137 mmol/L (ref 135–145)

## 2019-02-20 LAB — GLUCOSE, CAPILLARY
Glucose-Capillary: 104 mg/dL — ABNORMAL HIGH (ref 70–99)
Glucose-Capillary: 80 mg/dL (ref 70–99)
Glucose-Capillary: 97 mg/dL (ref 70–99)

## 2019-02-20 LAB — HEPARIN LEVEL (UNFRACTIONATED): Heparin Unfractionated: 0.27 IU/mL — ABNORMAL LOW (ref 0.30–0.70)

## 2019-02-20 LAB — CBC
HCT: 28.2 % — ABNORMAL LOW (ref 39.0–52.0)
Hemoglobin: 9.1 g/dL — ABNORMAL LOW (ref 13.0–17.0)
MCH: 33.8 pg (ref 26.0–34.0)
MCHC: 32.3 g/dL (ref 30.0–36.0)
MCV: 104.8 fL — ABNORMAL HIGH (ref 80.0–100.0)
Platelets: 210 10*3/uL (ref 150–400)
RBC: 2.69 MIL/uL — ABNORMAL LOW (ref 4.22–5.81)
RDW: 12.6 % (ref 11.5–15.5)
WBC: 7.2 10*3/uL (ref 4.0–10.5)
nRBC: 0 % (ref 0.0–0.2)

## 2019-02-20 MED ORDER — WARFARIN - PHARMACIST DOSING INPATIENT: Freq: Every day | Status: DC

## 2019-02-20 MED ORDER — HEPARIN SODIUM (PORCINE) 1000 UNIT/ML DIALYSIS: 1500.0000 [IU] | INTRAMUSCULAR | Status: DC | PRN

## 2019-02-20 MED ORDER — COUMADIN BOOK
Freq: Once | Status: AC
  Filled 2019-02-20: qty 1

## 2019-02-20 MED ORDER — AMIODARONE HCL 200 MG PO TABS: 200.0000 mg | ORAL_TABLET | Freq: Every day | ORAL | 3 refills | Status: DC

## 2019-02-20 MED ORDER — APIXABAN 5 MG PO TABS
5.0000 mg | ORAL_TABLET | Freq: Two times a day (BID) | ORAL | Status: DC
  Administered 2019-02-20: 10:00:00 5 mg via ORAL
  Filled 2019-02-20: qty 1

## 2019-02-20 MED ORDER — CALCIUM ACETATE (PHOS BINDER) 667 MG PO CAPS: 667.0000 mg | ORAL_CAPSULE | Freq: Three times a day (TID) | ORAL | 2 refills | Status: AC

## 2019-02-20 MED ORDER — ATORVASTATIN CALCIUM 10 MG PO TABS: 10.0000 mg | ORAL_TABLET | Freq: Every evening | ORAL | 3 refills | Status: DC

## 2019-02-20 MED ORDER — HEPARIN SODIUM (PORCINE) 1000 UNIT/ML IJ SOLN
INTRAMUSCULAR | Status: AC
  Administered 2019-02-20: 1500 [IU] via INTRAVENOUS_CENTRAL
  Filled 2019-02-20: qty 2

## 2019-02-20 MED ORDER — AMIODARONE HCL 200 MG PO TABS
200.0000 mg | ORAL_TABLET | Freq: Every day | ORAL | Status: DC
  Administered 2019-02-20: 10:00:00 200 mg via ORAL
  Filled 2019-02-20 (×2): qty 1

## 2019-02-20 MED ORDER — APIXABAN 5 MG PO TABS: 5.0000 mg | ORAL_TABLET | Freq: Two times a day (BID) | ORAL | 3 refills | Status: DC

## 2019-02-20 MED ORDER — WARFARIN SODIUM 7.5 MG PO TABS: 7.5000 mg | ORAL_TABLET | Freq: Once | ORAL | Status: DC

## 2019-02-20 MED FILL — AMIODARONE HCL 200 MG TABS: 200 | 90 days supply | Qty: 90 | Fill #0

## 2019-02-20 MED FILL — ELIQUIS 5 MG TABLET: 5 | 30 days supply | Qty: 60 | Fill #0

## 2019-02-20 MED FILL — ATORVASTATIN CALCIUM 10 MG: 10 | 90 days supply | Qty: 90 | Fill #0

## 2019-02-20 NOTE — Progress Notes (Signed)
  [facility] Progress Note   Subjective:  Seen in room, sp DCCV and started on apixaban and TDC was removed yesterday.  Objective Vitals:   02/19/19 1325 02/19/19 1341 02/19/19 1949 02/20/19 0320  BP: (!) 118/58 (!) 124/51 134/81 (!) 142/80  Pulse: 78 78 73 84  Resp: 20 15 16  (!) 21  Temp:  97.8 F (36.6 C) (!) 97.5 F (36.4 C) 98.4 F (36.9 C)  TempSrc:  Oral  Oral  SpO2: 99% 100% 100% 100%  Weight:    93.7 kg  Height:       Physical Exam General: Well appearing, NAD Heart: Tachycardic, no murmur Lungs: CTA anteriorly Abdomen: soft, non-tender Extremities: No LE edema Dialysis Access: LUE AVG + bruit, new R TDC  Medications:  . amiodarone  200 mg Oral Daily  . apixaban  5 mg Oral BID  . aspirin  81 mg Oral Daily  . atorvastatin  10 mg Oral QPM  . calcium acetate  667 mg Oral TID WC  . Chlorhexidine Gluconate Cloth  6 each Topical Q0600  . insulin aspart  0-6 Units Subcutaneous TID WC  . multivitamin  1 tablet Oral QHS  . pantoprazole  40 mg Oral Q0600    Dialysis:  [facility] MWF  4h 73min  87.5kg   1K/2.25 bath  Hep 1000+ 500u/hr  LUE AVG  300/600   - prosthetic weighs 1.6 kg  - Hectoral 0.73mcg IV/HD   - Epogen 600Units IV/HD   Assessment/Plan: 1. Hyperkalemia and volume overload: Due to noncompliance with HD. S/p urgent HD with low K bath at [facility] and transferred to [facility]. Now resolved w/ further HD.  2. SVT (new onset): Developed 1/9 after [facility] placement. Repeat K normal. S/p adenosine without effect, then amio x 3 and on drip. Had DCCV yesterday and is back in NSR. TDC also removed.  3. ESRD: Usual MWF sched. H/o thrombectomy in October, otherwise pt states is used at OP HD w/o much difficulty. AVG stuck on 1/11 w/o issue. Plan HD today. Per pmd patient for dc today after HD. 4. Hypertension/volume: up 5kg by wts, will UF 2-3 L today as tol 5. Anemia: Hgb > 12, no ESA needed. 6. Metabolic bone disease: CorrCa ok, Phos ^.  Unclear  home binder - start Phoslo 1/meals. 7. Nutrition- Renal diet  Kelly Splinter, MD   [facility] 02/20/2019, 11:52 AM

## 2019-02-20 NOTE — Progress Notes (Signed)
Discharge instructions given to patient. IV removed, clean and intact. Medications reviewed. All questions answered. Pt escorted home via uber.   Arletta Bale, RN

## 2019-02-20 NOTE — Discharge Instructions (Signed)
Information on my medicine - ELIQUIS® (apixaban) ° °This medication education was reviewed with me or my healthcare representative as part of my discharge preparation.  The pharmacist that spoke with me during my hospital stay was:  Tauheedah Bok Rhea, RPH ° °Why was Eliquis® prescribed for you? °Eliquis® was prescribed for you to reduce the risk of a blood clot forming that can cause a stroke if you have a medical condition called atrial fibrillation (a type of irregular heartbeat). ° °What do You need to know about Eliquis® ? °Take your Eliquis® TWICE DAILY - one tablet in the morning and one tablet in the evening with or without food. If you have difficulty swallowing the tablet whole please discuss with your pharmacist how to take the medication safely. ° °Take Eliquis® exactly as prescribed by your doctor and DO NOT stop taking Eliquis® without talking to the doctor who prescribed the medication.  Stopping may increase your risk of developing a stroke.  Refill your prescription before you run out. ° °After discharge, you should have regular check-up appointments with your healthcare provider that is prescribing your Eliquis®.  In the future your dose may need to be changed if your kidney function or weight changes by a significant amount or as you get older. ° °What do you do if you miss a dose? °If you miss a dose, take it as soon as you remember on the same day and resume taking twice daily.  Do not take more than one dose of ELIQUIS at the same time to make up a missed dose. ° °Important Safety Information °A possible side effect of Eliquis® is bleeding. You should call your healthcare provider right away if you experience any of the following: °  Bleeding from an injury or your nose that does not stop. °  Unusual colored urine (red or dark brown) or unusual colored stools (red or black). °  Unusual bruising for unknown reasons. °  A serious fall or if you hit your head (even if there is no  bleeding). ° °Some medicines may interact with Eliquis® and might increase your risk of bleeding or clotting while on Eliquis®. To help avoid this, consult your healthcare provider or pharmacist prior to using any new prescription or non-prescription medications, including herbals, vitamins, non-steroidal anti-inflammatory drugs (NSAIDs) and supplements. ° °This website has more information on Eliquis® (apixaban): http://www.eliquis.com/eliquis/home ° °

## 2019-02-20 NOTE — Progress Notes (Signed)
Pt received from hemodialysis. VSS. Call bell in reach. Pt denies needs at this time.  Arletta Bale, RN

## 2019-02-20 NOTE — Progress Notes (Signed)
ANTICOAGULATION CONSULT NOTE - Initial Consult  Pharmacy Consult for Heparin>>apixaban Indication: atrial fibrillation  Allergies  Allergen Reactions  . Tape Other (See Comments)    Pulls skin off    Patient Measurements: Height: 6' (182.9 cm) Weight: 206 lb 9.1 oz (93.7 kg) IBW/kg (Calculated) : 77.6 Heparin Dosing Weight:  85.3 kg  Vital Signs: Temp: 98.4 F (36.9 C) (01/13 0320) Temp Source: Oral (01/13 0320) BP: 142/80 (01/13 0320) Pulse Rate: 84 (01/13 0320)  Labs: Recent Labs    02/18/19 0316 02/19/19 0838 02/19/19 1613 02/20/19 0305 02/20/19 0518  HGB 10.1* 10.3*  --  9.1*  --   HCT 31.3* 32.0*  --  28.2*  --   PLT 205 226  --  210  --   HEPARINUNFRC 0.74* 0.46 0.25* 0.27*  --   CREATININE 7.95* 7.27*  --   --  8.88*    Estimated Creatinine Clearance: 12.2 mL/min (A) (by C-G formula based on SCr of 8.88 mg/dL (H)).   Medical History: Past Medical History:  Diagnosis Date  . Anemia   . Anxiety   . Blood transfusion without reported diagnosis   . CHF (congestive heart failure) ([place])   . Chronic kidney disease   . Depression   . Diabetes mellitus without complication ([place])   . End stage renal disease ([place])    M/W/F [facility]  . Hyperlipidemia   . Neuropathy   . Peripheral vascular disease ([place])   . PTSD (post-traumatic stress disorder)    Assessment:  Anticoag: Heparin for new atrial flutter No AC PTA. HL 0.27 remains low. Hgb down 10.3>9.1. Plts 210 stable.  Discussed with cardiology and will transition to apixaban this morning to facilitate a faster discharge.  Patient does not meet requirements for dose adjustment, will prescribe full dose.   Goal of Therapy:  Heparin level 0.3-0.7 units/ml Monitor platelets by anticoagulation protocol: Yes   Plan:  Apixaban 5mg  bid  Erin Hearing PharmD., BCPS Clinical Pharmacist 02/20/2019 9:48 AM

## 2019-02-20 NOTE — Discharge Summary (Signed)
Physician Discharge Summary  Patient ID: WAYMAN HEGGEN MRN: [id number] DOB/AGE: 09-18-1970 49 y.o.  Admit date: 02/15/2019 Discharge date: 02/20/2019  Admission Diagnoses: Principal Problem:   Hyperkalemia Active Problems:   DM2 (diabetes mellitus, type 2) (HCC)   HLD (hyperlipidemia)   S/P below knee amputation, right (HCC)   Anemia due to end stage renal disease (HCC)   Weakness   Acute pulmonary edema (HCC)   ESRD on hemodialysis (HCC)   CHF (congestive heart failure) (HCC)   Neuropathy   Peripheral vascular disease (HCC)   Abnormal EKG   Leukocytosis   Atypical pneumonia   Atrial flutter (HCC)  Items to be followed up on after Discharge: W/u for ischemic cardiac disease  Discharge Diagnoses:  Principal Problem:   Hyperkalemia Active Problems:   DM2 (diabetes mellitus, type 2) (HCC)   HLD (hyperlipidemia)   S/P below knee amputation, right (HCC)   Anemia due to end stage renal disease (HCC)   Weakness   Acute pulmonary edema (HCC)   ESRD on hemodialysis (HCC)   CHF (congestive heart failure) (HCC)   Neuropathy   Peripheral vascular disease (HCC)   Abnormal EKG   Leukocytosis   Atypical pneumonia   Atrial flutter ([place])   Discharged Condition: fair  Hospital Course: 49 YO man coming to ER with hyperkalemiawith EKG changes,volume overload, acute on chronic systolic heart failuredue to noncompliance with dialysis. He underwent urgent dialysisstatus post tunneled catheter placement by IR on 1/9th Postprocedure he developed SVT with stable blood pressure, then A fib/flutter. Now s/p TEE, cardioversion, removal of temporary dialysis catheter  Remains in sinus rhythm  see problem list view A fib/flutter: -SVT after tunneled catheter, failed to respond to adenosine times 2, converted to sinus after amiodarone and then into A fib/flutter s/p cardioversion with TEE Per recommendations -heparin drip for anticoagulation -amiodarone drip  Hyperkalemiawith EKG  changes,volume overload, acute on chronic systolic heart failuredue to noncompliance with dialysis. -per nephrology "He chronically skips his Wednesday dialysis despite multiple admissions for pulmonary edema/volume overload and hyperkalemia" -On presentation,his K was >7.5,urgent dialysis with response of symptoms -Nephrology input appreciated -continue HD while inpatient, resume MWF HD as outpt  AVG malfunction: -due to not able to cannulate left follow-up for extremity AV graft, afemoral temporary HD catheterwas placedin the ED for urgent dialysis -IR consulted for tunneled dialysis catheter placementon 1/9-->removed on 02/19/2019 AVF catheter worked well on 01/11 and 01/13 HD  ESRD On HD MWF/Anemia of chronic disease Oliguria Follow-up with nephrology  Acute on chronic systolic heart failure /pulmonary edema Left ventricular EF 25 to 30% on diet echocardiogram obtained in September 2020 Presentswith pulmonary edema,resolved after urgent dialysis,repeat chest x-rayisclear  Insulin-dependent type 2 diabetes,fairly controlled -Continue home dose insulin -Follow-up with PCP  Hyperlipidemia Report run out statin,new prescription provided at discharge Advised patient to follow-up with his PCP  PVDStatus post right BKA -Report baseline independent,lives with a roommate.   Consults: cardiology, nephrology and IR  Significant Diagnostic Studies:   CBC: Last Labs [] Expand by Default        Recent Labs  Lab 02/15/19 0500 02/17/19 0218 02/18/19 0316 02/19/19 0838  WBC 14.0* 11.3* 7.6 7.1  NEUTROABS 12.4*  --   --   --   HGB 12.4* 11.5* 10.1* 10.3*  HCT 38.8* 35.9* 31.3* 32.0*  MCV 107.2* 105.3* 105.4* 104.9*  PLT 274 207 205 226     Recent Labs  Lab 02/15/19 1701 02/16/19 0525 02/16/19 1437 02/17/19 0218 02/18/19 0316 02/19/19 [id number]  NA 134* 137 137 137 138 139  K 6.7* 5.3* 3.6 4.2 4.1 3.8  CL 93* 93* 98 97* 100 99  CO2 26 25 22 25 22  25   GLUCOSE 128* 82 70 87 92 93  BUN 51* 61* 27* 35* 49* 37*  CREATININE 7.28* 8.43* 4.66* 5.75* 7.95* 7.27*  CALCIUM 8.3* 7.6* 7.7* 7.5* 7.1* 7.5*  PHOS 6.3*  --   --  6.8* 8.0* 5.8*     Lab 02/15/19 1701 02/17/19 0218 02/18/19 0316 02/19/19 0838  ALBUMIN 3.8 3.0* 2.6* 2.6*     Coagulation Profile: Last Labs [] Expand by Default     Recent Labs  Lab 02/16/19 1437  INR 1.3*     CBG: Last Labs [] Expand by Default         Recent Labs  Lab 02/18/19 1844 02/18/19 2135 02/19/19 0620 02/19/19 1111 02/19/19 1634  GLUCAP 98 107* 111* 96 118*     SARS Coronavirus 2 NEGATIVE NEGATIVE Final         Influenza A by PCR NEGATIVE NEGATIVE Final   Influenza B by PCR NEGATIVE NEGATIVE Final   Imaging Results (Last 48 hours)[] Expand by Default  IR Removal Tun Cv Cath W/O FL  Result Date: 02/19/2019 INDICATION: Patient with history of ESRD on HD s/p tunneled right IJ HD catheter placement in IR 02/16/2019 by Dr. Anselm Pancoast. Patient now with functioning left AV graft. Request is made for removal of tunneled hemodialysis catheter. EXAM: REMOVAL OF TUNNELED HEMODIALYSIS CATHETER MEDICATIONS: None COMPLICATIONS: None immediate. PROCEDURE: Informed written consent was obtained from the patient following an explanation of the procedure, risks, benefits and alternatives to treatment. A time out was performed prior to the initiation of the procedure. Maximal barrier sterile technique was utilized including mask, sterile gloves, hand hygiene, and Betadine. Utilizing gentle traction, the catheter was removed intact. Hemostasis was obtained with manual compression. A dressing was placed. The patient tolerated the procedure well without immediate post procedural complication. IMPRESSION: Successful removal of tunneled dialysis catheter. Read by: Earley Abide, PA-C Electronically Signed   By: Sandi Mariscal M.D.   On: 02/19/2019 14:26   ECHO TEE  Result Date: 02/19/2019   TRANSESOPHOGEAL ECHO  REPORT   Patient Name:   GRECO PURSLEY Date of Exam: 02/19/2019 Medical Rec #:  [id number]      Height:       72.0 in Accession #:    [id number]     Weight:       203.0 lb Date of Birth:  Mar 09, 1970       BSA:          2.14 m Patient Age:    49 years       BP:           94/78 mmHg Patient Gender: M              HR:           138 bpm. Exam Location:  Inpatient  Procedure: Transesophageal Echo, Color Doppler and Cardiac Doppler Indications:     Atrial flutter 427.32 / I48.92  History:         Patient has prior history of Echocardiogram examinations, most                  recent 11/03/2018. Chronic kidney disease. anemia.  Sonographer:     Darlina Sicilian RDCS Referring Phys:  [id number] [place] Diagnosing Phys: Lyman Bishop MD  PROCEDURE: The TEE was followed by a successful  Cardioversion. No evidence of intracardiac thrombus. Patients was monitored while under deep sedation. The transesophogeal probe was passed through the esophogus of the patient. Imaged were obtained with the patient in a supine position. Image quality was excellent. The patient's vital signs; including heart rate, blood pressure, and oxygen saturation; remained stable throughout the procedure. The patient developed no complications during the procedure. IMPRESSIONS  1. Left ventricular ejection fraction, by visual estimation, is 20 to 25%. The left ventricle has severely decreased function. There is no left ventricular hypertrophy.  2. The left ventricle demonstrates global hypokinesis.  3. Severe hypokinesis of the left ventricular, entire anterior wall.  4. Global right ventricle has mildly reduced systolic function.The right ventricular size is normal. No increase in right ventricular wall thickness.  5. Left atrial size was moderately dilated.  6. Right atrial size was mildly dilated.  7. The aortic valve is tricuspid. Aortic valve regurgitation is not visualized.  8. The mitral valve is grossly normal. Mild mitral valve regurgitation.  9.  The tricuspid valve is grossly normal. 10. The pulmonic valve was grossly normal. Pulmonic valve regurgitation is trivial. 11. The dialysis catheter tip was not visualized in the RA. 12. No intracardiac thrombi or masses were visualized. FINDINGS  Left Ventricle: Left ventricular ejection fraction, by visual estimation, is 20 to 25%. The left ventricle has severely decreased function. Severe hypokinesis of the left ventricular, entire anterior wall. The left ventricle demonstrates global hypokinesis. There is no left ventricular hypertrophy. Right Ventricle: The right ventricular size is normal. No increase in right ventricular wall thickness. Global RV systolic function is has mildly reduced systolic function. Left Atrium: Left atrial size was moderately dilated. Right Atrium: Right atrial size was mildly dilated The dialysis catheter tip was not visualized in the RA. Pericardium: There is no evidence of pericardial effusion. Mitral Valve: The mitral valve is grossly normal. Mild mitral valve regurgitation. Tricuspid Valve: The tricuspid valve is grossly normal. Tricuspid valve regurgitation is trivial. Aortic Valve: The aortic valve is tricuspid. Aortic valve regurgitation is not visualized. Pulmonic Valve: The pulmonic valve was grossly normal. Pulmonic valve regurgitation is trivial. Aorta: The aortic root and ascending aorta are structurally normal, with no evidence of dilitation. Shunts: No atrial level shunt detected by color flow Doppler. Additional Comments: No intracardiac thrombi or masses were visualized.  Lyman Bishop MD Electronically signed by Lyman Bishop MD Signature Date/Time: 02/19/2019/1:15:35 PM    Final       Treatments:   TEE  Cardioversion  Temporary dialysis catheter placement and removal  Discharge Exam: Blood pressure 132/83, pulse 81, temperature 97.8 F (36.6 C), temperature source Oral, resp. rate 15, height 6' (1.829 m), weight 93.7 kg, SpO2 100 %.  General exam:  Appears calm and comfortable  Respiratory system: Clear to auscultation. Respiratory effort normal. Cardiovascular system: tachy Gastrointestinal system: Abdomen is nondistended, soft and nontender.  Central nervous system: Alert and oriented. No focal neurological deficits. Extremities: right BKA Skin: No rashes, lesions or ulcers Psychiatry: Judgement and insight appear normal. Mood & affect appropriate.   Disposition: Discharge disposition: 01-Home or Self Care       Discharge Instructions    Call MD for:  difficulty breathing, headache or visual disturbances   Complete by: As directed    Call MD for:  persistant nausea and vomiting   Complete by: As directed    Call MD for:  severe uncontrolled pain   Complete by: As directed    Call MD for:  temperature >  100.4   Complete by: As directed    Diet - low sodium heart healthy   Complete by: As directed    Increase activity slowly   Complete by: As directed      Allergies as of 02/20/2019      Reactions   Tape Other (See Comments)   Pulls skin off      Medication List    TAKE these medications   amiodarone 200 MG tablet Commonly known as: PACERONE Take 1 tablet (200 mg total) by mouth daily. Start taking on: February 21, 2019   apixaban 5 MG Tabs tablet Commonly known as: ELIQUIS Take 1 tablet (5 mg total) by mouth 2 (two) times daily.   aspirin 81 MG chewable tablet Chew 81 mg by mouth daily.   atorvastatin 10 MG tablet Commonly known as: LIPITOR Take 1 tablet (10 mg total) by mouth every evening.   calcium acetate 667 MG capsule Commonly known as: PHOSLO Take 1 capsule (667 mg total) by mouth 3 (three) times daily with meals. Start taking on: February 21, 2019   Lantus SoloStar 100 UNIT/ML Solostar Pen Generic drug: Insulin Glargine Inject 10 Units into the skin at bedtime.   multivitamin Tabs tablet Take 1 tablet by mouth daily.      Follow-up Information    f/u with pcp for diabetes and  cholesterol management Follow up in 1 week(s).        Lendon Colonel, NP Follow up on 03/06/2019.   Specialties: Nurse Practitioner, Radiology, Cardiology Why: Please go to hospital follow-up January 27th at 2:45 PM Contact information: [address] [place] [address] [phone]           Signed: Donnamae Jude 02/20/2019, 5:24 PM

## 2019-02-20 NOTE — Progress Notes (Signed)
Wheeled pt down to main entrance of hospital. Melburn Popper had already left. This RN called [employer] [employer]. Brought pt back up to the unit and charge RN called about taxi service. Pt will now be discharging home via taxi.   Arletta Bale, RN

## 2019-02-20 NOTE — Progress Notes (Signed)
EDCSW called [facility] transportation to facilitate a ride home for PT.

## 2019-02-20 NOTE — Progress Notes (Addendum)
Progress Note  Patient Name: Brendan Campbell Date of Encounter: 02/20/2019  Primary Cardiologist: Minus Breeding, MD   Subjective   Patient underwent successful TEE/DCCV yesterday. Still in NSR. No CP or SOB. HD today  Inpatient Medications    Scheduled Meds: . aspirin  81 mg Oral Daily  . atorvastatin  10 mg Oral QPM  . calcium acetate  667 mg Oral TID WC  . Chlorhexidine Gluconate Cloth  6 each Topical Q0600  . insulin aspart  0-6 Units Subcutaneous TID WC  . multivitamin  1 tablet Oral QHS  . pantoprazole  40 mg Oral Q0600   Continuous Infusions: . amiodarone 30 mg/hr (02/20/19 [id number])  . heparin 1,100 Units/hr (02/20/19 0407)   PRN Meds: acetaminophen **OR** acetaminophen, senna-docusate   Vital Signs    Vitals:   02/19/19 1325 02/19/19 1341 02/19/19 1949 02/20/19 0320  BP: (!) 118/58 (!) 124/51 134/81 (!) 142/80  Pulse: 78 78 73 84  Resp: 20 15 16  (!) 21  Temp:  97.8 F (36.6 C) (!) 97.5 F (36.4 C) 98.4 F (36.9 C)  TempSrc:  Oral  Oral  SpO2: 99% 100% 100% 100%  Weight:    93.7 kg  Height:        Intake/Output Summary (Last 24 hours) at 02/20/2019 0816 Last data filed at 02/20/2019 0611 Gross per 24 hour  Intake 645.95 ml  Output --  Net 645.95 ml   Last 3 Weights 02/20/2019 02/19/2019 02/18/2019  Weight (lbs) 206 lb 9.1 oz 203 lb 0.7 oz 198 lb 10.2 oz  Weight (kg) 93.7 kg 92.1 kg 90.1 kg      Telemetry    NSR, Hr 70s - Personally Reviewed  ECG    Pending - Personally Reviewed  Physical Exam   GEN: No acute distress.   Neck: No JVD Cardiac: RRR, no murmurs, rubs, or gallops.  Respiratory: Clear to auscultation bilaterally. GI: Soft, nontender, non-distended  MS: No edema; R BKA Neuro:  Nonfocal  Psych: Normal affect   Labs    High Sensitivity Troponin:   Recent Labs  Lab 02/15/19 0500 02/15/19 0725  TROPONINIHS 69* 70*      Chemistry Recent Labs  Lab 02/18/19 0316 02/19/19 0838 02/20/19 0518  NA 138 139 137  K 4.1 3.8 4.6   CL 100 99 97*  CO2 22 25 21*  GLUCOSE 92 93 111*  BUN 49* 37* 52*  CREATININE 7.95* 7.27* 8.88*  CALCIUM 7.1* 7.5* 7.8*  ALBUMIN 2.6* 2.6* 2.8*  GFRNONAA 7* 8* 6*  GFRAA 8* 9* 7*  ANIONGAP 16* 15 19*     Hematology Recent Labs  Lab 02/18/19 0316 02/19/19 0838 02/20/19 0305  WBC 7.6 7.1 7.2  RBC 2.97* 3.05* 2.69*  HGB 10.1* 10.3* 9.1*  HCT 31.3* 32.0* 28.2*  MCV 105.4* 104.9* 104.8*  MCH 34.0 33.8 33.8  MCHC 32.3 32.2 32.3  RDW 12.8 12.6 12.6  PLT 205 226 210    BNP Recent Labs  Lab 02/15/19 0500  BNP 3,499.0*     DDimer No results for input(s): DDIMER in the last 168 hours.   Radiology    IR Removal Tun Cv Cath W/O FL  Result Date: 02/19/2019 INDICATION: Patient with history of ESRD on HD s/p tunneled right IJ HD catheter placement in IR 02/16/2019 by Dr. Anselm Pancoast. Patient now with functioning left AV graft. Request is made for removal of tunneled hemodialysis catheter. EXAM: REMOVAL OF TUNNELED HEMODIALYSIS CATHETER MEDICATIONS: None COMPLICATIONS: None immediate. PROCEDURE: Informed  written consent was obtained from the patient following an explanation of the procedure, risks, benefits and alternatives to treatment. A time out was performed prior to the initiation of the procedure. Maximal barrier sterile technique was utilized including mask, sterile gloves, hand hygiene, and Betadine. Utilizing gentle traction, the catheter was removed intact. Hemostasis was obtained with manual compression. A dressing was placed. The patient tolerated the procedure well without immediate post procedural complication. IMPRESSION: Successful removal of tunneled dialysis catheter. Read by: Earley Abide, PA-C Electronically Signed   By: Sandi Mariscal M.D.   On: 02/19/2019 14:26   ECHO TEE  Result Date: 02/19/2019   TRANSESOPHOGEAL ECHO REPORT   Patient Name:   Brendan Campbell Date of Exam: 02/19/2019 Medical Rec #:  [id number]      Height:       72.0 in Accession #:    [id number]     Weight:        203.0 lb Date of Birth:  [birth year]       BSA:          2.14 m Patient Age:    49 years       BP:           94/78 mmHg Patient Gender: M              HR:           138 bpm. Exam Location:  Inpatient  Procedure: Transesophageal Echo, Color Doppler and Cardiac Doppler Indications:     Atrial flutter 427.32 / I48.92  History:         Patient has prior history of Echocardiogram examinations, most                  recent 11/03/2018. Chronic kidney disease. anemia.  Sonographer:     Darlina Sicilian RDCS Referring Phys:  [id number] [place] Diagnosing Phys: Lyman Bishop MD  PROCEDURE: The TEE was followed by a successful Cardioversion. No evidence of intracardiac thrombus. Patients was monitored while under deep sedation. The transesophogeal probe was passed through the esophogus of the patient. Imaged were obtained with the patient in a supine position. Image quality was excellent. The patient's vital signs; including heart rate, blood pressure, and oxygen saturation; remained stable throughout the procedure. The patient developed no complications during the procedure. IMPRESSIONS  1. Left ventricular ejection fraction, by visual estimation, is 20 to 25%. The left ventricle has severely decreased function. There is no left ventricular hypertrophy.  2. The left ventricle demonstrates global hypokinesis.  3. Severe hypokinesis of the left ventricular, entire anterior wall.  4. Global right ventricle has mildly reduced systolic function.The right ventricular size is normal. No increase in right ventricular wall thickness.  5. Left atrial size was moderately dilated.  6. Right atrial size was mildly dilated.  7. The aortic valve is tricuspid. Aortic valve regurgitation is not visualized.  8. The mitral valve is grossly normal. Mild mitral valve regurgitation.  9. The tricuspid valve is grossly normal. 10. The pulmonic valve was grossly normal. Pulmonic valve regurgitation is trivial. 11. The dialysis catheter tip was  not visualized in the RA. 12. No intracardiac thrombi or masses were visualized. FINDINGS  Left Ventricle: Left ventricular ejection fraction, by visual estimation, is 20 to 25%. The left ventricle has severely decreased function. Severe hypokinesis of the left ventricular, entire anterior wall. The left ventricle demonstrates global hypokinesis. There is no left ventricular hypertrophy. Right Ventricle: The right ventricular size is normal.  No increase in right ventricular wall thickness. Global RV systolic function is has mildly reduced systolic function. Left Atrium: Left atrial size was moderately dilated. Right Atrium: Right atrial size was mildly dilated The dialysis catheter tip was not visualized in the RA. Pericardium: There is no evidence of pericardial effusion. Mitral Valve: The mitral valve is grossly normal. Mild mitral valve regurgitation. Tricuspid Valve: The tricuspid valve is grossly normal. Tricuspid valve regurgitation is trivial. Aortic Valve: The aortic valve is tricuspid. Aortic valve regurgitation is not visualized. Pulmonic Valve: The pulmonic valve was grossly normal. Pulmonic valve regurgitation is trivial. Aorta: The aortic root and ascending aorta are structurally normal, with no evidence of dilitation. Shunts: No atrial level shunt detected by color flow Doppler. Additional Comments: No intracardiac thrombi or masses were visualized.  Lyman Bishop MD Electronically signed by Lyman Bishop MD Signature Date/Time: 02/19/2019/1:15:35 PM    Final     Cardiac Studies    Echo 11/03/18  1. Left ventricular ejection fraction, by visual estimation, is 25 to 30%. The left ventricle has severely decreased function. There is moderately increased left ventricular hypertrophy. 2. Left ventricular diastolic Doppler parameters are indeterminate pattern of LV diastolic filling. 3. Global right ventricle has normal systolic function.The right ventricular size is mildly enlarged. No increase  in right ventricular wall thickness. 4. Left atrial size was moderately dilated. 5. Right atrial size was mildly dilated. 6. Mild to moderate aortic valve annular calcification. 7. Mild to moderate mitral annular calcification. 8. The mitral valve is normal in structure. Mild mitral valve regurgitation. No evidence of mitral stenosis. 9. The tricuspid valve is normal in structure. Tricuspid valve regurgitation was not visualized by color flow Doppler. 10. The aortic valve has an indeterminant number of cusps Aortic valve regurgitation was not visualized by color flow Doppler. Mild aortic valve sclerosis without stenosis. 11. The pulmonic valve was not well visualized. Pulmonic valve regurgitation is not visualized by color flow Doppler. 12. The inferior vena cava is normal in size with greater than 50% respiratory variability, suggesting right atrial pressure of 3 mmHg.  Patient Profile     49 y.o. male who presented to ED AP with SOB after missing HD and admitted with K 7.5 treated with urgent dialysis and IV calcium. He developed tachycardia and was treated with adenosine which showed aflutter and he was started on IV amiodarone.  Assessment & Plan    Aflutter RVR -IV amio initiated with brief conversion to NSR  - Patient underwent successful TEE/DCCV yesterday  -CHADSVASC= 2 (CHF, DM2). Patient will need anticoagulation>>warfarin per pharmacy - will start Amio 200 mg daily -Echo in 09/20 showed EF 20-25%  Hyperkalemia/ESRD on HD -Hx of HD non-compliance -Patient underwent emergent HD on presentation  -Management per nephrology/IM  Acute on Chronic Systolic HF -LVEF [id number] in 10/2018 -Suspected ischemic cardiomyopathy>>needs ischemic workup>>likely with Lexiscan stress test as OP -Volume management per HD -Plan to start low dose BB at discharge if pressures allow  HLD -Plan to give new rx at discharge  For questions or updates, please contact [facility] Please  consult [email or web address] for contact info under      Signed, Cadence Ninfa Meeker, PA-C  02/20/2019, 8:16 AM    Successful DCCV with TEE yesterday, patient feels well. Warfarin per pharmacy. Amiodarone orally homegoing. Consider outpatient ischemic evaluation, preferred plan per patient.  Elouise Munroe, MD

## 2019-02-20 NOTE — Progress Notes (Signed)
ANTICOAGULATION CONSULT NOTE - Initial Consult  Pharmacy Consult for Heparin Indication: atrial fibrillation  Allergies  Allergen Reactions  . Tape Other (See Comments)    Pulls skin off    Patient Measurements: Height: 6' (182.9 cm) Weight: 206 lb 9.1 oz (93.7 kg) IBW/kg (Calculated) : 77.6 Heparin Dosing Weight:  85.3 kg  Vital Signs: Temp: 98.4 F (36.9 C) (01/13 0320) Temp Source: Oral (01/13 0320) BP: 142/80 (01/13 0320) Pulse Rate: 84 (01/13 0320)  Labs: Recent Labs    02/18/19 0316 02/19/19 0838 02/19/19 1613 02/20/19 0305  HGB 10.1* 10.3*  --  9.1*  HCT 31.3* 32.0*  --  28.2*  PLT 205 226  --  210  HEPARINUNFRC 0.74* 0.46 0.25* 0.27*  CREATININE 7.95* 7.27*  --   --     Estimated Creatinine Clearance: 14.9 mL/min (A) (by C-G formula based on SCr of 7.27 mg/dL (H)).   Medical History: Past Medical History:  Diagnosis Date  . Anemia   . Anxiety   . Blood transfusion without reported diagnosis   . CHF (congestive heart failure) ([place])   . Chronic kidney disease   . Depression   . Diabetes mellitus without complication ([place])   . End stage renal disease ([place])    M/W/F [facility]  . Hyperlipidemia   . Neuropathy   . Peripheral vascular disease ([place])   . PTSD (post-traumatic stress disorder)    Assessment:  Anticoag: Heparin for new atrial flutter No AC PTA. HL 0.27 remains low. Hgb down 10.3>9.1. Plts 210 stable.  Goal of Therapy:  Heparin level 0.3-0.7 units/ml Monitor platelets by anticoagulation protocol: Yes   Plan:  - 1/13 AM: Increase IV heparin to 1100 units/hr - F/u transition to PO AC - Daily HL and CBC   Rihan Schueler S. Alford Highland, PharmD, BCPS Clinical Staff Pharmacist [email or web address] Alford Highland, Danyka Merlin Stillinger 02/20/2019,4:06 AM

## 2019-03-05 NOTE — Progress Notes (Deleted)
Cardiology Office Note   Date:  03/05/2019   ID:  Brendan Campbell, DOB 05/19/1970, MRN [id number]  PCP:  Patient, No Pcp Per  Cardiologist: Dr.Branch No chief complaint on file.    History of Present Illness: Brendan Campbell is a 49 y.o. male who presents for posthospitalization follow-up.  He was seen on consultation on 02/16/2019 in the setting of atrial flutter.  He has a history of hypertension, CHF, most recent echo March 2020 with an EF of 15 to 20%, poorly controlled diabetes, with end-stage renal disease on dialysis.  He underwent urgent dialysis status post tunneled catheter placement by IR on 02/16/2019, and was found to have significant volume overload with acute on chronic systolic pressure due to medical noncompliance.   The patient was initially in the hospital for placement of hemodialysis catheter in the setting of decompensated CHF, at which time he developed tachycardia, treated with a [place] which revealed atrial flutter with 2-1 AV conduction.Brendan Campbell  He was given IV amiodarone and converted to normal sinus rhythm.  He did return to atrial flutter and a DCCV was planned.CHADS VASC Score of 2.  He was not found to be a candidate for anticoagulation due to history of medical noncompliance.  He was continued on amiodarone loading prior to procedure.  On 02/19/2019 he had a TEE cardioversion by Dr. Debara Pickett.  He was cardioverted x1 to normal sinus rhythm, after TEE did not reveal PFO or left atrial appendage thrombus.  He remained in normal sinus rhythm for the duration of his hospitalization.  He was discharged on amiodarone 200 mg daily, he also was started on apixaban 5 mg twice daily with hopes that he would be medically compliant with this regimen.  He was also continued on aspirin 81 mg daily which we need to discuss further at this office visit as he is on a DOAC.    Past Medical History:  Diagnosis Date  . Anemia   . Anxiety   . Blood transfusion without reported diagnosis   . CHF  (congestive heart failure) (Brendan Campbell)   . Chronic kidney disease   . Depression   . Diabetes mellitus without complication (Brendan Campbell)   . End stage renal disease (Brendan Campbell)    M/W/F [facility] Brendan Campbell  . Hyperlipidemia   . Neuropathy   . Peripheral vascular disease (Brendan Campbell)   . PTSD (post-traumatic stress disorder)     Past Surgical History:  Procedure Laterality Date  . A/V FISTULAGRAM N/A 10/09/2018   Procedure: A/V FISTULAGRAM;  Surgeon: Serafina Mitchell, MD;  Location: [place] [facility];  Service: Cardiovascular;  Laterality: N/A;  . AV FISTULA PLACEMENT Left 09/22/2016   Procedure: CREATION OF LEFT ARM ARTERIOVENOUS (AV) FISTULA;  Surgeon: Waynetta Sandy, MD;  Location: [place];  Service: Vascular;  Laterality: Left;  . AV FISTULA PLACEMENT Left 10/31/2017   Procedure: INSERTION OF ARTERIOVENOUS (AV) GORE-TEX 4-80mm STETCH GRAFT LEFT ARM;  Surgeon: Waynetta Sandy, MD;  Location: [place];  Service: Vascular;  Laterality: Left;  . BASCILIC VEIN TRANSPOSITION Left 08/17/2017   Procedure: SECOND STAGE BASILIC VEIN TRANSPOSITION LEFT ARM;  Surgeon: Waynetta Sandy, MD;  Location: [place];  Service: Vascular;  Laterality: Left;  . BELOW KNEE LEG AMPUTATION Right   . CARDIOVERSION N/A 02/19/2019   Procedure: CARDIOVERSION;  Surgeon: [name], MD;  Location: [facility] ENDOSCOPY;  Service: Cardiovascular;  Laterality: N/A;  . CHOLECYSTECTOMY    . FOOT SURGERY    . IR FLUORO GUIDE CV  LINE RIGHT  05/16/2016  . IR FLUORO GUIDE CV LINE RIGHT  02/16/2019  . IR REMOVAL TUN CV CATH W/O FL  05/16/2016  . IR REMOVAL TUN CV CATH W/O FL  02/19/2019  . IR THROMBECTOMY AV FISTULA W/THROMBOLYSIS/PTA INC/SHUNT/IMG LEFT Left 11/09/2018  . IR US GUIDE VASC ACCESS LEFT  11/09/2018  . IR US GUIDE VASC ACCESS RIGHT  05/16/2016  . IR US GUIDE VASC ACCESS RIGHT  02/16/2019  . PERIPHERAL VASCULAR BALLOON ANGIOPLASTY  10/09/2018   Procedure: PERIPHERAL VASCULAR BALLOON ANGIOPLASTY;  Surgeon: Serafina Mitchell, MD;  Location: [place] [facility];  Service: Cardiovascular;;  . TEE WITHOUT CARDIOVERSION N/A 02/19/2019   Procedure: TRANSESOPHAGEAL ECHOCARDIOGRAM (TEE);  Surgeon: [name], MD;  Location: [facility] ENDOSCOPY;  Service: Cardiovascular;  Laterality: N/A;     Current Outpatient Medications  Medication Sig Dispense Refill  . amiodarone (PACERONE) 200 MG tablet Take 1 tablet (200 mg total) by mouth daily. 90 tablet 3  . apixaban (ELIQUIS) 5 MG TABS tablet Take 1 tablet (5 mg total) by mouth 2 (two) times daily. 180 tablet 3  . aspirin 81 MG chewable tablet Chew 81 mg by mouth daily.    Brendan Campbell atorvastatin (LIPITOR) 10 MG tablet Take 1 tablet (10 mg total) by mouth every evening. 90 tablet 3  . calcium acetate (PHOSLO) 667 MG capsule Take 1 capsule (667 mg total) by mouth 3 (three) times daily with meals. 90 capsule 2  . LANTUS SOLOSTAR 100 UNIT/ML Solostar Pen Inject 10 Units into the skin at bedtime.     . multivitamin (RENA-VIT) TABS tablet Take 1 tablet by mouth daily.     No current facility-administered medications for this visit.    Allergies:   Tape    Social History:  The patient  reports that he quit smoking about 12 years ago. He has never used smokeless tobacco. He reports that he does not drink alcohol or use drugs.   Family History:  The patient's family history includes Cancer in his mother; Diabetes in his father, mother, and sister; Heart attack in his father.    ROS: All other systems are reviewed and negative. Unless otherwise mentioned in H&P    PHYSICAL EXAM: VS:  There were no vitals taken for this visit. , BMI There is no height or weight on file to calculate BMI. GEN: Well nourished, well developed, in no acute distress HEENT: normal Neck: no JVD, carotid bruits, or masses Cardiac: ***RRR; no murmurs, rubs, or gallops,no edema  Respiratory:  Clear to auscultation bilaterally, normal work of breathing GI: soft, nontender, nondistended, + BS MS: no deformity or atrophy Skin: warm  and dry, no rash Neuro:  Strength and sensation are intact Psych: euthymic mood, full affect   EKG:  EKG {ACTION; IS/IS [id number] ordered today. The ekg ordered today demonstrates ***   Recent Labs: 10/08/2018: Magnesium 2.0 11/02/2018: ALT 19 02/15/2019: B Natriuretic Peptide 3,499.0 02/20/2019: BUN 52; Creatinine, Ser 8.88; Hemoglobin 9.1; Platelets 210; Potassium 4.6; Sodium 137    Lipid Panel    Component Value Date/Time   CHOL 139 05/10/2016 0340   CHOL 238 (H) 09/15/2015 0822   TRIG 137 05/10/2016 0340   HDL 40 (L) 05/10/2016 0340   HDL 36 (L) 09/15/2015 0822   CHOLHDL 3.5 05/10/2016 0340   VLDL 27 05/10/2016 0340   LDLCALC 72 05/10/2016 0340   LDLCALC 149 (H) 09/15/2015 0822      Wt Readings from Last 3 Encounters:  02/20/19 198  lb 10.2 oz (90.1 kg)  11/09/18 200 lb (90.7 kg)  11/03/18 191 lb 9.3 oz (86.9 kg)      Other studies Reviewed: TEE Findings: 02/19/2019  1. LEFT VENTRICLE: The left ventricular wall thickness is normal.  The left ventricular cavity is dilated in size. Wall motion is globally hypokinetic with severe anterior hypokinesis.  LVEF is 20-25%.  2. RIGHT VENTRICLE:  The right ventricle is dilated and mildly hypokinetic.    3. LEFT ATRIUM:  The left atrium is moderately dilated in size without any thrombus or masses.  There is not spontaneous echo contrast ("smoke") in the left atrium consistent with a low flow state.  4. LEFT ATRIAL APPENDAGE:  The left atrial appendage is free of any thrombus or masses. The appendage has single lobes. Pulse doppler indicates low flow in the appendage.  5. ATRIAL SEPTUM:  The atrial septum appears intact and is free of thrombus and/or masses.  There is no evidence for interatrial shunting by color doppler.  6. RIGHT ATRIUM:  The right atrium is mildly dilated in size and function without any thrombus or masses.  7. MITRAL VALVE:  The mitral valve is normal in structure and function with Mild  regurgitation.  There were no vegetations or stenosis.  8. AORTIC VALVE:  The aortic valve is trileaflet, normal in structure and function with no regurgitation.  There were no vegetations or stenosis  9. TRICUSPID VALVE:  The tricuspid valve is normal in structure and function with trivial regurgitation.  There were no vegetations or stenosis  10.  PULMONIC VALVE:  The pulmonic valve is normal in structure and function with no regurgitation.  There were no vegetations or stenosis.   11. AORTIC ARCH, ASCENDING AND DESCENDING AORTA:  There was no Ron Parker et. Al, 1992) atherosclerosis of the ascending aorta, aortic arch, or proximal descending aorta.  12. PULMONARY VEINS: Anomalous pulmonary venous return was not noted.  13. PERICARDIUM: The pericardium appeared normal and non-thickened.  There is no pericardial effusion.  CARDIOVERSION:     Second Time Out: Verified patient identification, verified procedure, site/side was marked, verified correct patient position, special equipment/implants available, medications/allergies/relevent history reviewed, required imaging and test results available.  Performed  Procedure:  1. Patient placed on cardiac monitor, pulse oximetry, supplemental oxygen as necessary.  2. Sedation administered per anesthesia 3. Pacer pads placed anterior and posterior chest. 4. Cardioverted 1 time(s).  5. Cardioverted at 150J biphasic.   Echocardiogram 11/03/2018 1. Left ventricular ejection fraction, by visual estimation, is 25 to 30%. The left ventricle has severely decreased function. There is moderately increased left ventricular hypertrophy. 2. Left ventricular diastolic Doppler parameters are indeterminate pattern of LV diastolic filling. 3. Global right ventricle has normal systolic function.The right ventricular size is mildly enlarged. No increase in right ventricular wall thickness. 4. Left atrial size was moderately dilated. 5. Right atrial  size was mildly dilated. 6. Mild to moderate aortic valve annular calcification. 7. Mild to moderate mitral annular calcification. 8. The mitral valve is normal in structure. Mild mitral valve regurgitation. No evidence of mitral stenosis. 9. The tricuspid valve is normal in structure. Tricuspid valve regurgitation was not visualized by color flow Doppler. 10. The aortic valve has an indeterminant number of cusps Aortic valve regurgitation was not visualized by color flow Doppler. Mild aortic valve sclerosis without stenosis. 11. The pulmonic valve was not well visualized. Pulmonic valve regurgitation is not visualized by color flow Doppler. 12. The inferior vena cava is normal in  size with greater than 50% respiratory variability, suggesting right atrial pressure of 3 mmHg.    ASSESSMENT AND PLAN:  1.  ***   Current medicines are reviewed at length with the patient today.    Labs/ tests ordered today include: *** Phill Myron. [place] Pugh, ANP, AACC   03/05/2019 6:37 PM    [facility] [address] Office [phone] Fax [phone]  Notice: This dictation was prepared with Dragon dictation along with smaller phrase technology. Any transcriptional errors that result from this process are unintentional and may not be corrected upon review.

## 2019-03-06 ENCOUNTER — Ambulatory Visit: Payer: Medicaid Other | Admitting: Adult Health

## 2019-04-24 ENCOUNTER — Encounter: Payer: Self-pay | Admitting: Adult Health

## 2019-04-30 ENCOUNTER — Ambulatory Visit: Payer: Medicaid Other | Admitting: Cardiovascular Disease

## 2019-04-30 ENCOUNTER — Encounter: Payer: Self-pay | Admitting: Cardiovascular Disease

## 2019-05-16 ENCOUNTER — Emergency Department (HOSPITAL_COMMUNITY)
Admission: EM | Admit: 2019-05-16 | Discharge: 2019-05-16 | Disposition: A | Discharge status: Home / Self Care | Payer: Medicaid Other | Attending: Emergency Medicine | Admitting: Emergency Medicine

## 2019-05-16 ENCOUNTER — Emergency Department (HOSPITAL_COMMUNITY): Payer: Medicaid Other

## 2019-05-16 ENCOUNTER — Encounter (HOSPITAL_COMMUNITY): Payer: Self-pay | Admitting: Emergency Medicine

## 2019-05-16 ENCOUNTER — Other Ambulatory Visit: Payer: Self-pay

## 2019-05-16 DIAGNOSIS — Z794 Long term (current) use of insulin: Secondary | ICD-10-CM | POA: Insufficient documentation

## 2019-05-16 DIAGNOSIS — N186 End stage renal disease: Secondary | ICD-10-CM | POA: Diagnosis not present

## 2019-05-16 DIAGNOSIS — I739 Peripheral vascular disease, unspecified: Secondary | ICD-10-CM | POA: Diagnosis not present

## 2019-05-16 DIAGNOSIS — R0602 Shortness of breath: Secondary | ICD-10-CM

## 2019-05-16 DIAGNOSIS — E1122 Type 2 diabetes mellitus with diabetic chronic kidney disease: Secondary | ICD-10-CM | POA: Diagnosis not present

## 2019-05-16 DIAGNOSIS — Z20822 Contact with and (suspected) exposure to covid-19: Secondary | ICD-10-CM | POA: Insufficient documentation

## 2019-05-16 DIAGNOSIS — R0789 Other chest pain: Secondary | ICD-10-CM | POA: Insufficient documentation

## 2019-05-16 DIAGNOSIS — E875 Hyperkalemia: Secondary | ICD-10-CM | POA: Diagnosis not present

## 2019-05-16 DIAGNOSIS — Z992 Dependence on renal dialysis: Secondary | ICD-10-CM

## 2019-05-16 LAB — BASIC METABOLIC PANEL
Anion gap: 18 — ABNORMAL HIGH (ref 5–15)
BUN: 62 mg/dL — ABNORMAL HIGH (ref 6–20)
CO2: 24 mmol/L (ref 22–32)
Calcium: 6.9 mg/dL — ABNORMAL LOW (ref 8.9–10.3)
Chloride: 95 mmol/L — ABNORMAL LOW (ref 98–111)
Creatinine, Ser: 11 mg/dL — ABNORMAL HIGH (ref 0.61–1.24)
GFR calc Af Amer: 6 mL/min — ABNORMAL LOW (ref >60)
GFR calc non Af Amer: 5 mL/min — ABNORMAL LOW (ref >60)
Glucose, Bld: 99 mg/dL (ref 70–99)
Potassium: 7.1 mmol/L (ref 3.5–5.1)
Sodium: 137 mmol/L (ref 135–145)

## 2019-05-16 LAB — HEPATITIS B SURFACE ANTIGEN: Hepatitis B Surface Ag: NONREACTIVE

## 2019-05-16 LAB — CBC WITH DIFFERENTIAL/PLATELET
Abs Immature Granulocytes: 0.03 10*3/uL (ref 0.00–0.07)
Basophils Absolute: 0.1 10*3/uL (ref 0.0–0.1)
Basophils Relative: 1 %
Eosinophils Absolute: 0.3 10*3/uL (ref 0.0–0.5)
Eosinophils Relative: 3 %
HCT: 37 % — ABNORMAL LOW (ref 39.0–52.0)
Hemoglobin: 11.6 g/dL — ABNORMAL LOW (ref 13.0–17.0)
Immature Granulocytes: 0 %
Lymphocytes Relative: 24 %
Lymphs Abs: 2.1 10*3/uL (ref 0.7–4.0)
MCH: 33.5 pg (ref 26.0–34.0)
MCHC: 31.4 g/dL (ref 30.0–36.0)
MCV: 106.9 fL — ABNORMAL HIGH (ref 80.0–100.0)
Monocytes Absolute: 0.5 10*3/uL (ref 0.1–1.0)
Monocytes Relative: 6 %
Neutro Abs: 5.7 10*3/uL (ref 1.7–7.7)
Neutrophils Relative %: 66 %
Platelets: 293 10*3/uL (ref 150–400)
RBC: 3.46 MIL/uL — ABNORMAL LOW (ref 4.22–5.81)
RDW: 13.4 % (ref 11.5–15.5)
WBC: 8.8 10*3/uL (ref 4.0–10.5)
nRBC: 0 % (ref 0.0–0.2)

## 2019-05-16 LAB — CBG MONITORING, ED
Glucose-Capillary: 112 mg/dL — ABNORMAL HIGH (ref 70–99)
Glucose-Capillary: 179 mg/dL — ABNORMAL HIGH (ref 70–99)

## 2019-05-16 LAB — TROPONIN I (HIGH SENSITIVITY)
Troponin I (High Sensitivity): 41 ng/L — ABNORMAL HIGH (ref <18)
Troponin I (High Sensitivity): 43 ng/L — ABNORMAL HIGH (ref <18)

## 2019-05-16 LAB — POTASSIUM: Potassium: 4.4 mmol/L (ref 3.5–5.1)

## 2019-05-16 LAB — RESPIRATORY PANEL BY RT PCR (FLU A&B, COVID)
Influenza A by PCR: NEGATIVE
Influenza B by PCR: NEGATIVE
SARS Coronavirus 2 by RT PCR: NEGATIVE

## 2019-05-16 LAB — BRAIN NATRIURETIC PEPTIDE: B Natriuretic Peptide: 928 pg/mL — ABNORMAL HIGH (ref 0.0–100.0)

## 2019-05-16 IMAGING — DX DG CHEST 1V PORT
1 series · 1 of 1 positions shown · non-contrast
Comparison: [DATE]

CLINICAL DATA: Shortness of breath

EXAM:
PORTABLE CHEST 1 VIEW

[chest ap]
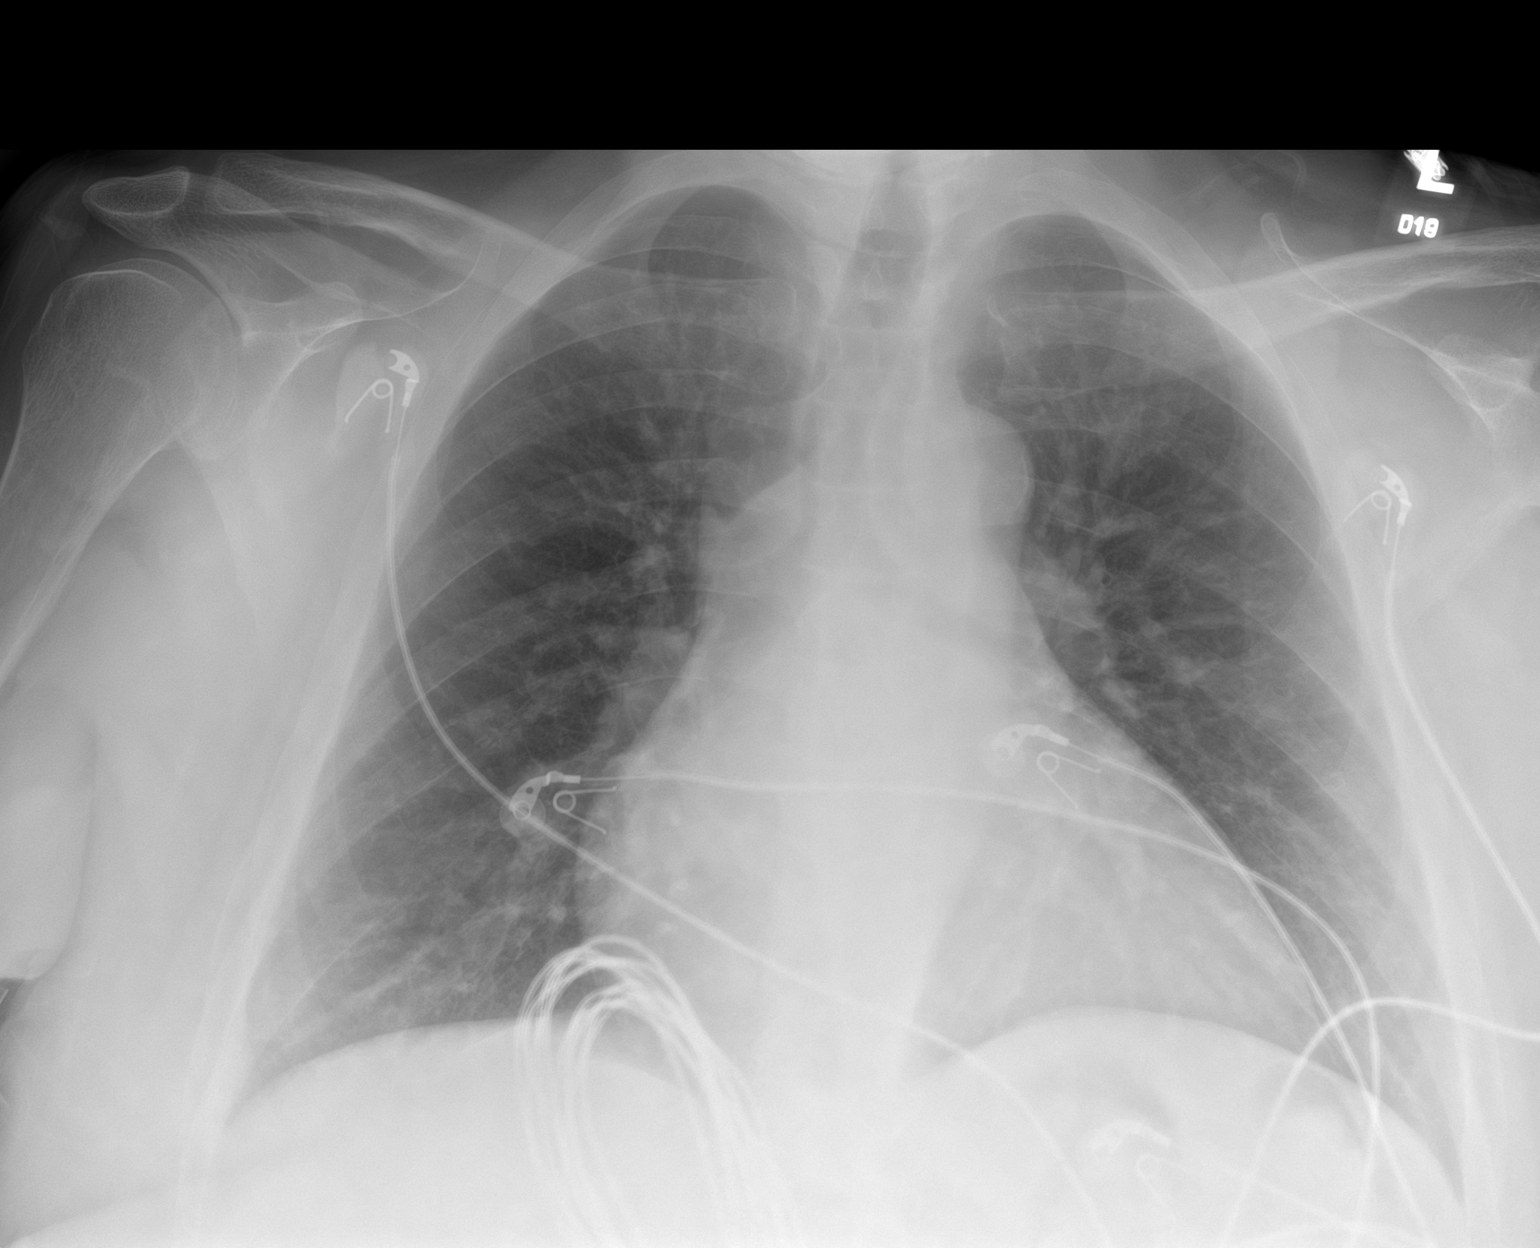

[1 of 1 positions shown; findings below may reference images not displayed]

FINDINGS: Cardiac shadow is enlarged but stable. Mild vascular congestion is
seen with minimal interstitial edema. No sizable effusion is noted.
No focal infiltrate is seen. No bony abnormality is noted.
IMPRESSION: Changes of mild CHF.

## 2019-05-16 MED ORDER — LIDOCAINE HCL (PF) 1 % IJ SOLN: 5.0000 mL | INTRAMUSCULAR | Status: DC | PRN

## 2019-05-16 MED ORDER — HEPARIN SODIUM (PORCINE) 1000 UNIT/ML DIALYSIS
20.0000 [IU]/kg | INTRAMUSCULAR | Status: DC | PRN
  Filled 2019-05-16: qty 2

## 2019-05-16 MED ORDER — SODIUM CHLORIDE 0.9 % IV SOLN: 100.0000 mL | INTRAVENOUS | Status: DC | PRN

## 2019-05-16 MED ORDER — PENTAFLUOROPROP-TETRAFLUOROETH EX AERO: 1.0000 "application " | INHALATION_SPRAY | CUTANEOUS | Status: DC | PRN

## 2019-05-16 MED ORDER — CHLORHEXIDINE GLUCONATE CLOTH 2 % EX PADS: 6.0000 | MEDICATED_PAD | Freq: Every day | CUTANEOUS | Status: DC

## 2019-05-16 MED ORDER — DEXTROSE 50 % IV SOLN
1.0000 | Freq: Once | INTRAVENOUS | Status: AC
  Administered 2019-05-16: 50 mL via INTRAVENOUS
  Filled 2019-05-16: qty 50

## 2019-05-16 MED ORDER — LIDOCAINE-PRILOCAINE 2.5-2.5 % EX CREA: 1.0000 "application " | TOPICAL_CREAM | CUTANEOUS | Status: DC | PRN

## 2019-05-16 MED ORDER — DEXTROSE 10 % IV SOLN: Freq: Once | INTRAVENOUS | Status: AC

## 2019-05-16 MED ORDER — CALCIUM GLUCONATE 10 % IV SOLN
1.0000 g | Freq: Once | INTRAVENOUS | Status: AC
  Administered 2019-05-16: 1 g via INTRAVENOUS
  Filled 2019-05-16: qty 10

## 2019-05-16 MED ORDER — INSULIN ASPART 100 UNIT/ML IV SOLN
5.0000 [IU] | Freq: Once | INTRAVENOUS | Status: AC
  Administered 2019-05-16: 5 [IU] via INTRAVENOUS

## 2019-05-16 MED ORDER — ACETAMINOPHEN 325 MG PO TABS
650.0000 mg | ORAL_TABLET | Freq: Once | ORAL | Status: AC
  Administered 2019-05-16: 650 mg via ORAL
  Filled 2019-05-16: qty 2

## 2019-05-16 NOTE — ED Provider Notes (Addendum)
Medical screening examination/treatment/procedure(s) were conducted as a shared visit with non-physician practitioner(s) and myself.  I personally evaluated the patient during the encounter.  EKG Interpretation  Date/Time:  Thursday May 16 2019 07:46:25 EDT Ventricular Rate:  88 PR Interval:    QRS Duration: 205 QT Interval:  513 QTC Calculation: 621 R Axis:   -85 Text Interpretation: Sinus or ectopic atrial tachycardia Short PR interval Left bundle branch block No significant change since Feb 19, 2019 Confirmed by Fredia Sorrow [phone]) on 05/16/2019 8:02:57 AM   Patient seen by me along with the physician assistant.  Patient is a dialysis patient.  Normally dialyzed Monday Wednesdays and Fridays.  Patient had partial dialysis on Monday.  He missed dialysis yesterday.  He called EMS for shortness of breath that started at about 1 in the morning.  Patient arrived here in no respiratory distress.  Oxygen saturations were in the upper 90s to 100%.  Lab work shows that potassium is 7.1.  For this patient given calcium gluconate IV push as well as insulin and D50.  Contacted nephrology.  They will plan to dialyze him here.  So he will be admitted.  Chest x-ray showed a little bit of vascular congestion but no florid pulmonary edema.  Physician assistant will contact the hospitalist service for admission.    Fredia Sorrow, MD 05/16/19 [phone]   Addendum: Patient will get dialysis here in the emergency department.  And we will discharge.  Spoke with the hospitalist.  Also he spoke with nephrology and they are arranging dialysis to occur here in the emergency department.   Fredia Sorrow, MD 05/16/19 1003

## 2019-05-16 NOTE — Procedures (Signed)
    EMERGENT HEMODIALYSIS TREATMENT NOTE:  HD performed in ED d/t K 7.1.  Difficult cannulation of LUE AVG with 16g needles.  Qb limited by excessively high AP and VP.  Average Qb 250cc/m.  4 hour session completed.  First 30 minutes on 0K bath, next 1.5 hours on 1K bath, and remaining 2 hours on 2K bath.  Unable to meet goal due to cramping and soft BP.  Net UF 2.3L.  All blood was returned and hemostasis was achieved in 20 minutes.  Potentially life-threatening complications of hyperkalemia were, again, discussed with patient who habitually misses one HD session per week (usually Wednesdays).  Adherence with outpatient therapy was strongly encouraged.  Rockwell Alexandria, RN

## 2019-05-16 NOTE — Consult Note (Signed)
Reason for Consult: To manage dialysis and dialysis related needs Referring Physician: [name] is an 49 y.o. male with DM, PAD s/p amputation, CHF, depression and ESRD- [facility] MWF- with some noncompliance of HD.  He presents to ER today after feeling funny today and SOB.  Missed HD yesterday.  In ER potassium noted to be 7.1 so we are consulted for HD.  HR is normal at 82.  He runs on a low K bath at HD so this is seemingly an issue for him.  He has an adequate O2 sat on RA   Dialysis:  [facility] MWF  4h 18min  87.5kg   1K/2.25 bath  Hep 1000+ 500u/hr  LUE AVG  300/600   - prosthetic weighs 1.6 kg  - Hectoral 0.56mcg IV/HD   - Epogen 600Units IV/HD  Past Medical History:  Diagnosis Date  . Anemia   . Anxiety   . Blood transfusion without reported diagnosis   . CHF (congestive heart failure) ([place])   . Chronic kidney disease   . Depression   . Diabetes mellitus without complication ([place])   . End stage renal disease ([place])    M/W/F [facility]  . Hyperlipidemia   . Neuropathy   . Peripheral vascular disease ([place])   . PTSD (post-traumatic stress disorder)     Past Surgical History:  Procedure Laterality Date  . A/V FISTULAGRAM N/A 10/09/2018   Procedure: A/V FISTULAGRAM;  Surgeon: Serafina Mitchell, MD;  Location: [place] [facility];  Service: Cardiovascular;  Laterality: N/A;  . AV FISTULA PLACEMENT Left 09/22/2016   Procedure: CREATION OF LEFT ARM ARTERIOVENOUS (AV) FISTULA;  Surgeon: Waynetta Sandy, MD;  Location: [place];  Service: Vascular;  Laterality: Left;  . AV FISTULA PLACEMENT Left 10/31/2017   Procedure: INSERTION OF ARTERIOVENOUS (AV) GORE-TEX 4-37mm STETCH GRAFT LEFT ARM;  Surgeon: Waynetta Sandy, MD;  Location: [place];  Service: Vascular;  Laterality: Left;  . BASCILIC VEIN TRANSPOSITION Left 08/17/2017   Procedure: SECOND STAGE BASILIC VEIN TRANSPOSITION LEFT ARM;  Surgeon: Waynetta Sandy, MD;  Location: [place];   Service: Vascular;  Laterality: Left;  . BELOW KNEE LEG AMPUTATION Right   . CARDIOVERSION N/A 02/19/2019   Procedure: CARDIOVERSION;  Surgeon: [name], MD;  Location: [facility] ENDOSCOPY;  Service: Cardiovascular;  Laterality: N/A;  . CHOLECYSTECTOMY    . FOOT SURGERY    . IR FLUORO GUIDE CV LINE RIGHT  05/16/2016  . IR FLUORO GUIDE CV LINE RIGHT  02/16/2019  . IR REMOVAL TUN CV CATH W/O FL  05/16/2016  . IR REMOVAL TUN CV CATH W/O FL  02/19/2019  . IR THROMBECTOMY AV FISTULA W/THROMBOLYSIS/PTA INC/SHUNT/IMG LEFT Left 11/09/2018  . IR US GUIDE VASC ACCESS LEFT  11/09/2018  . IR US GUIDE VASC ACCESS RIGHT  05/16/2016  . IR US GUIDE VASC ACCESS RIGHT  02/16/2019  . PERIPHERAL VASCULAR BALLOON ANGIOPLASTY  10/09/2018   Procedure: PERIPHERAL VASCULAR BALLOON ANGIOPLASTY;  Surgeon: Serafina Mitchell, MD;  Location: [place] [facility];  Service: Cardiovascular;;  . TEE WITHOUT CARDIOVERSION N/A 02/19/2019   Procedure: TRANSESOPHAGEAL ECHOCARDIOGRAM (TEE);  Surgeon: [name], MD;  Location: [facility] ENDOSCOPY;  Service: Cardiovascular;  Laterality: N/A;    Family History  Problem Relation Age of Onset  . Cancer Mother        lung  . Diabetes Mother   . Heart attack Father   . Diabetes Father   . Diabetes Sister  Social History:  reports that he quit smoking about 12 years ago. He has never used smokeless tobacco. He reports that he does not drink alcohol or use drugs.  Allergies:  Allergies  Allergen Reactions  . Tape Other (See Comments)    Pulls skin off    Medications: I have reviewed the patient's current medications.   Results for orders placed or performed during the hospital encounter of 05/16/19 (from the past 48 hour(s))  CBG monitoring, ED     Status: Abnormal   Collection Time: 05/16/19  7:50 AM  Result Value Ref Range   Glucose-Capillary 112 (H) 70 - 99 mg/dL    Comment: Glucose reference range applies only to samples taken after fasting for at least 8 hours.  CBC with  Differential     Status: Abnormal   Collection Time: 05/16/19  8:14 AM  Result Value Ref Range   WBC 8.8 4.0 - 10.5 K/uL   RBC 3.46 (L) 4.22 - 5.81 MIL/uL   Hemoglobin 11.6 (L) 13.0 - 17.0 g/dL   HCT 37.0 (L) 39.0 - 52.0 %   MCV 106.9 (H) 80.0 - 100.0 fL   MCH 33.5 26.0 - 34.0 pg   MCHC 31.4 30.0 - 36.0 g/dL   RDW 13.4 11.5 - 15.5 %   Platelets 293 150 - 400 K/uL   nRBC 0.0 0.0 - 0.2 %   Neutrophils Relative % 66 %   Neutro Abs 5.7 1.7 - 7.7 K/uL   Lymphocytes Relative 24 %   Lymphs Abs 2.1 0.7 - 4.0 K/uL   Monocytes Relative 6 %   Monocytes Absolute 0.5 0.1 - 1.0 K/uL   Eosinophils Relative 3 %   Eosinophils Absolute 0.3 0.0 - 0.5 K/uL   Basophils Relative 1 %   Basophils Absolute 0.1 0.0 - 0.1 K/uL   Immature Granulocytes 0 %   Abs Immature Granulocytes 0.03 0.00 - 0.07 K/uL    Comment: Performed at [facility], [address] [place], [place] [address]  Basic metabolic panel     Status: Abnormal   Collection Time: 05/16/19  8:14 AM  Result Value Ref Range   Sodium 137 135 - 145 mmol/L   Potassium 7.1 (HH) 3.5 - 5.1 mmol/L    Comment: CRITICAL RESULT CALLED TO, READ BACK BY AND VERIFIED WITH: PATRAW,B AT 8:40AM ON 05/16/19 BY FESTERMAN,C    Chloride 95 (L) 98 - 111 mmol/L   CO2 24 22 - 32 mmol/L   Glucose, Bld 99 70 - 99 mg/dL    Comment: Glucose reference range applies only to samples taken after fasting for at least 8 hours.   BUN 62 (H) 6 - 20 mg/dL   Creatinine, Ser 11.00 (H) 0.61 - 1.24 mg/dL   Calcium 6.9 (L) 8.9 - 10.3 mg/dL   GFR calc non Af Amer 5 (L) >60 mL/min   GFR calc Af Amer 6 (L) >60 mL/min   Anion gap 18 (H) 5 - 15    Comment: Performed at [facility], [address] [place], [place] [address]  Brain natriuretic peptide     Status: Abnormal   Collection Time: 05/16/19  8:14 AM  Result Value Ref Range   B Natriuretic Peptide 928.0 (H) 0.0 - 100.0 pg/mL    Comment: Performed at [facility], [address] [place], [place] [address]  Troponin I  (High Sensitivity)     Status: Abnormal   Collection Time: 05/16/19  8:14 AM  Result Value Ref Range  Troponin I (High Sensitivity) 41 (H) <18 ng/L    Comment: (NOTE) Elevated high sensitivity troponin I (hsTnI) values and significant  changes across serial measurements may suggest ACS but many other  chronic and acute conditions are known to elevate hsTnI results.  Refer to the "Links" section for chest pain algorithms and additional  guidance. Performed at [facility], [address] [place], [place] [address]     DG Chest Portable 1 View  Result Date: 05/16/2019 CLINICAL DATA:  Shortness of breath EXAM: PORTABLE CHEST 1 VIEW COMPARISON:  02/16/2019 FINDINGS: Cardiac shadow is enlarged but stable. Mild vascular congestion is seen with minimal interstitial edema. No sizable effusion is noted. No focal infiltrate is seen. No bony abnormality is noted. IMPRESSION: Changes of mild CHF. Electronically Signed   By: Inez Catalina M.D.   On: 05/16/2019 08:24    ROS: "I feel funny"  SOB Blood pressure 121/66, pulse 82, temperature 97.7 F (36.5 C), temperature source Oral, resp. rate 14, height 6' (1.829 m), weight 98.4 kg, SpO2 90 %. General appearance: cooperative and no distress Resp: diminished breath sounds bibasilar Cardio: regular rate and rhythm, S1, S2 normal, no murmur, click, rub or gallop GI: soft, non-tender; bowel sounds normal; no masses,  no organomegaly Extremities: extremities normal, atraumatic, no cyanosis or edema and right prosthesis left upper arm AVG with good thrill and bruit   Assessment/Plan: 49 year old WM with multiple medical problems including ESRD.  He missed HD yesterday now presents feeling SOB and funny-  K of 7.1 1 Hyperkalemia-  ESRD and missed HD.  Likely responsible for his "funny feeling"  Will plan on HD semi-emergently today.  Will plan for treatment in the ER as otherwise patient does not seem to need admission.  Then would be OK for discharge in my  opinion.  I told him he needed to go to HD tomorrow "I dont miss Mon and Fri" 2 ESRD: normally MWF 3 Hypertension: BP good today.  Does not seem that volume overloaded but c/o feeling like drowning so set goal for UF high  4. Anemia of ESRD: hgb over 11-  No need for ESA today  5. Metabolic Bone Disease: normally on hectorol.  Since hopefully only doing one treatment will not dose today    Louis Meckel 05/16/2019, 9:26 AM

## 2019-05-16 NOTE — ED Notes (Signed)
Date and time results received: 05/16/19 8:42 AM  (use smartphrase ".now" to insert current time)  Test: Potassium Critical Value: 7.1  Name of Provider Notified: kaitlyn RN  Orders Received? Or Actions Taken?: Orders Received - See Orders for details

## 2019-05-16 NOTE — ED Notes (Signed)
Spoke with [facility] and gave information for patient to be transported by Melburn Popper to residence after calling central cab and emergency contact on patients chart with no answer. Per safe transport Melburn Popper would be here after 1700 to transport patient home.

## 2019-05-16 NOTE — ED Provider Notes (Signed)
[facility] EMERGENCY DEPARTMENT Provider Note   CSN: [id number] Arrival date & time: 05/16/19  0736     History Chief Complaint  Patient presents with  . Shortness of Breath    Brendan Campbell is a 49 y.o. male with past medical history significant for CHF, CKD, diabetes, ESRD dialysis MWF at [facility], PVD, hyperlipidemia presents to emergency department today via EMS with chief complaint of acute on set of  shortness of breath. Onset 7 hours prior to arrival. Patient states he woke up during the night and felt like he could not breath. He feels like he is volume overloaded. Patient missed his dialysis session yesterday. He also endorses intermittent chest pain that he describes as mild dull pressure, rates 4/10 in severity. Chest pressure does not radiate. He states this is how he typically feels when he misses a dialysis session. He has not taken any medications for his symptoms prior to arrival. He denies fever, chills, cough, abdominal pain, nausea, vomiting, lower extremity edema. He denies any sick contacts or known positive contacts for covid 19. Patient is anticoagulated on Eliquis.   History provided by patient with additional history obtained from chart review.     Past Medical History:  Diagnosis Date  . Anemia   . Anxiety   . Blood transfusion without reported diagnosis   . CHF (congestive heart failure) ([place])   . Chronic kidney disease   . Depression   . Diabetes mellitus without complication ([place])   . End stage renal disease ([place])    M/W/F [facility]  . Hyperlipidemia   . Neuropathy   . Peripheral vascular disease ([place])   . PTSD (post-traumatic stress disorder)     Patient Active Problem List   Diagnosis Date Noted  . Atrial flutter ([place])   . CHF (congestive heart failure) ([place])   . Neuropathy   . Peripheral vascular disease ([place])   . Abnormal EKG   . Leukocytosis   . Atypical pneumonia   . ESRD needing dialysis ([place])   . Acute pulmonary edema (HCC)   .  ESRD on hemodialysis ([place])   . Hyperkalemia 10/05/2018  . Weakness 10/05/2018  . Chest pain 05/10/2016  . Anemia due to end stage renal disease ([place]) 05/10/2016  . 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 Lumbar radiculopathy 03/08/2016  . S/P below knee amputation, right ([place]) 03/05/2015  . DM2 (diabetes mellitus, type 2) ([place]) 03/03/2015  . HLD (hyperlipidemia) 03/03/2015  . Back pain 03/03/2015    Past Surgical History:  Procedure Laterality Date  . A/V FISTULAGRAM N/A 10/09/2018   Procedure: A/V FISTULAGRAM;  Surgeon: Serafina Mitchell, MD;  Location: [place] CV LAB;  Service: Cardiovascular;  Laterality: N/A;  . AV FISTULA PLACEMENT Left 09/22/2016   Procedure: CREATION OF LEFT ARM ARTERIOVENOUS (AV) FISTULA;  Surgeon: Waynetta Sandy, MD;  Location: [place];  Service: Vascular;  Laterality: Left;  . AV FISTULA PLACEMENT Left 10/31/2017   Procedure: INSERTION OF ARTERIOVENOUS (AV) GORE-TEX 4-80mm STETCH GRAFT LEFT ARM;  Surgeon: Waynetta Sandy, MD;  Location: [place];  Service: Vascular;  Laterality: Left;  . BASCILIC VEIN TRANSPOSITION Left 08/17/2017   Procedure: SECOND STAGE BASILIC VEIN TRANSPOSITION LEFT ARM;  Surgeon: Waynetta Sandy, MD;  Location: [place];  Service: Vascular;  Laterality: Left;  . BELOW KNEE LEG AMPUTATION Right   . CARDIOVERSION N/A 02/19/2019   Procedure: CARDIOVERSION;  Surgeon: [name], MD;  Location: [place];  Service: Cardiovascular;  Laterality: N/A;  . CHOLECYSTECTOMY    . FOOT SURGERY    . IR FLUORO GUIDE CV LINE RIGHT  05/16/2016  . IR FLUORO GUIDE CV LINE RIGHT  02/16/2019  . IR REMOVAL TUN CV CATH W/O FL  05/16/2016  . IR REMOVAL TUN CV CATH W/O FL  02/19/2019  . IR THROMBECTOMY AV FISTULA W/THROMBOLYSIS/PTA INC/SHUNT/IMG LEFT Left 11/09/2018  . IR US GUIDE VASC ACCESS LEFT  11/09/2018  . IR US GUIDE VASC ACCESS RIGHT  05/16/2016  . IR US GUIDE VASC ACCESS RIGHT  02/16/2019  . PERIPHERAL VASCULAR BALLOON ANGIOPLASTY   10/09/2018   Procedure: PERIPHERAL VASCULAR BALLOON ANGIOPLASTY;  Surgeon: Serafina Mitchell, MD;  Location: [place] CV LAB;  Service: Cardiovascular;;  . TEE WITHOUT CARDIOVERSION N/A 02/19/2019   Procedure: TRANSESOPHAGEAL ECHOCARDIOGRAM (TEE);  Surgeon: [name], MD;  Location: [facility] ENDOSCOPY;  Service: Cardiovascular;  Laterality: N/A;       Family History  Problem Relation Age of Onset  . Cancer Mother        lung  . Diabetes Mother   . Heart attack Father   . Diabetes Father   . Diabetes Sister     Social History   Tobacco Use  . Smoking status: Former Smoker    Quit date: 09/03/2006    Years since quitting: 12.7  . Smokeless tobacco: Never Used  Substance Use Topics  . Alcohol use: No  . Drug use: No    Home Medications Prior to Admission medications   Medication Sig Start Date End Date Taking? Authorizing Provider  amiodarone (PACERONE) 200 MG tablet Take 1 tablet (200 mg total) by mouth daily. 02/21/19   Donnamae Jude, MD  apixaban (ELIQUIS) 5 MG TABS tablet Take 1 tablet (5 mg total) by mouth 2 (two) times daily. 02/20/19   Donnamae Jude, MD  aspirin 81 MG chewable tablet Chew 81 mg by mouth daily.    [provider]  atorvastatin (LIPITOR) 10 MG tablet Take 1 tablet (10 mg total) by mouth every evening. 02/20/19   Donnamae Jude, MD  calcium acetate (PHOSLO) 667 MG capsule Take 1 capsule (667 mg total) by mouth 3 (three) times daily with meals. 02/21/19   Donnamae Jude, MD  LANTUS SOLOSTAR 100 UNIT/ML Solostar Pen Inject 10 Units into the skin at bedtime.  01/18/19   [provider]  lidocaine-prilocaine (EMLA) cream Apply 1 application topically See admin instructions. 05/06/19   [provider]  multivitamin (RENA-VIT) TABS tablet Take 1 tablet by mouth daily. 08/04/18   [provider]  tiZANidine (ZANAFLEX) 4 MG tablet Take 4 mg by mouth daily as needed. 04/07/19   [provider]    Allergies    Tape  Review  of Systems   Review of Systems All other systems are reviewed and are negative for acute change except as noted in the HPI.  Physical Exam Updated Vital Signs BP 138/85 (BP Location: Right Arm)   Pulse 87   Temp 97.7 F (36.5 C) (Oral)   Resp 14   Ht 6' (1.829 m)   Wt 98.4 kg   SpO2 100%   BMI 29.43 kg/m   Physical Exam Vitals and nursing note reviewed.  Constitutional:      General: He is not in acute distress.    Appearance: He is ill-appearing. He is not toxic-appearing or diaphoretic.  HENT:     Head: Normocephalic and atraumatic.     Right Ear: Tympanic  membrane and external ear normal.     Left Ear: Tympanic membrane and external ear normal.     Nose: Nose normal.     Mouth/Throat:     Mouth: Mucous membranes are moist.     Pharynx: Oropharynx is clear.  Eyes:     General: No scleral icterus.       Right eye: No discharge.        Left eye: No discharge.     Extraocular Movements: Extraocular movements intact.     Conjunctiva/sclera: Conjunctivae normal.     Pupils: Pupils are equal, round, and reactive to light.  Neck:     Vascular: No JVD.  Cardiovascular:     Rate and Rhythm: Normal rate and regular rhythm.     Pulses: Normal pulses.          Radial pulses are 2+ on the right side and 2+ on the left side.     Heart sounds: Normal heart sounds.  Pulmonary:     Comments: Lungs are diminished throughout. Normal work of breathing. Symmetric chest rise. No wheezing, rales, or rhonchi. Abdominal:     Comments: Abdomen is soft, non-distended, and non-tender in all quadrants. No rigidity, no guarding. No peritoneal signs.  Musculoskeletal:        General: Normal range of motion.     Cervical back: Normal range of motion.     Left lower leg: No edema.     Comments: R BKA  Skin:    General: Skin is warm and dry.     Capillary Refill: Capillary refill takes less than 2 seconds.  Neurological:     Mental Status: He is oriented to person, place, and time.     GCS:  GCS eye subscore is 4. GCS verbal subscore is 5. GCS motor subscore is 6.     Comments: Fluent speech, no facial droop.  Psychiatric:        Behavior: Behavior normal.     ED Results / Procedures / Treatments   Labs (all labs ordered are listed, but only abnormal results are displayed) Labs Reviewed  CBC WITH DIFFERENTIAL/PLATELET - Abnormal; Notable for the following components:      Result Value   RBC 3.46 (*)    Hemoglobin 11.6 (*)    HCT 37.0 (*)    MCV 106.9 (*)    All other components within normal limits  BASIC METABOLIC PANEL - Abnormal; Notable for the following components:   Potassium 7.1 (*)    Chloride 95 (*)    BUN 62 (*)    Creatinine, Ser 11.00 (*)    Calcium 6.9 (*)    GFR calc non Af Amer 5 (*)    GFR calc Af Amer 6 (*)    Anion gap 18 (*)    All other components within normal limits  BRAIN NATRIURETIC PEPTIDE - Abnormal; Notable for the following components:   B Natriuretic Peptide 928.0 (*)    All other components within normal limits  CBG MONITORING, ED - Abnormal; Notable for the following components:   Glucose-Capillary 112 (*)    All other components within normal limits  CBG MONITORING, ED - Abnormal; Notable for the following components:   Glucose-Capillary 179 (*)    All other components within normal limits  TROPONIN I (HIGH SENSITIVITY) - Abnormal; Notable for the following components:   Troponin I (High Sensitivity) 41 (*)    All other components within normal limits  TROPONIN I (HIGH SENSITIVITY) -  Abnormal; Notable for the following components:   Troponin I (High Sensitivity) 43 (*)    All other components within normal limits  RESPIRATORY PANEL BY RT PCR (FLU A&B, COVID)  POTASSIUM  HEPATITIS B SURFACE ANTIGEN    EKG EKG Interpretation  Date/Time:  Thursday May 16 2019 07:46:25 EDT Ventricular Rate:  88 PR Interval:    QRS Duration: 205 QT Interval:  513 QTC Calculation: 621 R Axis:   -85 Text Interpretation: Sinus or ectopic  atrial tachycardia Short PR interval Left bundle branch block No significant change since Feb 19, 2019 Confirmed by Fredia Sorrow [phone]) on 05/16/2019 8:02:57 AM   Radiology DG Chest Portable 1 View  Result Date: 05/16/2019 CLINICAL DATA:  Shortness of breath EXAM: PORTABLE CHEST 1 VIEW COMPARISON:  02/16/2019 FINDINGS: Cardiac shadow is enlarged but stable. Mild vascular congestion is seen with minimal interstitial edema. No sizable effusion is noted. No focal infiltrate is seen. No bony abnormality is noted. IMPRESSION: Changes of mild CHF. Electronically Signed   By: Inez Catalina M.D.   On: 05/16/2019 08:24    Procedures .Critical Care Performed by: Cherre Robins, PA-C Authorized by: Cherre Robins, PA-C   Critical care provider statement:    Critical care time (minutes):  31   Critical care time was exclusive of:  Separately billable procedures and treating other patients and teaching time   Critical care was necessary to treat or prevent imminent or life-threatening deterioration of the following conditions:  Metabolic crisis   Critical care was time spent personally by me on the following activities:  Blood draw for specimens, development of treatment plan with patient or surrogate, discussions with consultants, evaluation of patient's response to treatment, examination of patient, ordering and performing treatments and interventions, ordering and review of laboratory studies, ordering and review of radiographic studies, pulse oximetry, re-evaluation of patient's condition and review of old charts   (including critical care time)  Medications Ordered in ED Medications  Chlorhexidine Gluconate Cloth 2 % PADS 6 each (6 each Topical Not Given 05/16/19 1155)  pentafluoroprop-tetrafluoroeth (GEBAUERS) aerosol 1 application (has no administration in time range)  lidocaine (PF) (XYLOCAINE) 1 % injection 5 mL (has no administration in time range)  lidocaine-prilocaine (EMLA) cream  1 application (has no administration in time range)  0.9 %  sodium chloride infusion (has no administration in time range)  0.9 %  sodium chloride infusion (has no administration in time range)  heparin injection 2,000 Units (has no administration in time range)  calcium gluconate inj 10% (1 g) URGENT USE ONLY! (1 g Intravenous Given 05/16/19 0910)  dextrose 10 % infusion ( Intravenous New Bag/Given 05/16/19 0918)  insulin aspart (novoLOG) injection 5 Units (5 Units Intravenous Given 05/16/19 0910)    And  dextrose 50 % solution 50 mL (50 mLs Intravenous Given 05/16/19 0912)  acetaminophen (TYLENOL) tablet 650 mg (650 mg Oral Given 05/16/19 1532)    ED Course  I have reviewed the triage vital signs and the nursing notes.  Pertinent labs & imaging results that were available during my care of the patient were reviewed by me and considered in my medical decision making (see chart for details).    MDM Rules/Calculators/A&P                      Patient seen and examined. Patient presents awake, alert, hemodynamically stable, afebrile, non toxic. He is ill appearing without signs of sepsis. No tachypnea, tachycardia, or hypoxia. His  lung sounds are diminished, without rales or rhonchi. No abdominal tenderness. No lower extremity edema. CBG 112 on arrival.  Labs show no leukocytosis, hemoglobin of 11.6 appears better than his baseline. He has known anemia diagnosis. BMP with hyperkalemia 7.1.  BUN/creatinine 62/11.0, hypocalcemia 6.9, elevated anion gap of 18.  Kidney function is elevated compared to 2 months ago when BUN/creatinine was 52/8.88. BNP elevated at 928. Troponin elevated at 41, lower than prior, feel ACS is not cause of elevation.EKG shows LBBB, that is not new. No ischemic changes from prior when compared. Chest xray viewed by me shows mild vascular congestion with intersitial edema, no acute infectious findings. Ordered calcium gluconate, insulin, dextrose.This case was discussed with Dr.  Rogene Houston who has seen the patient and agrees with plan. Case discussed with on call nephrologist Dr. Moshe Cipro who will arrange dialysis to be performed here in the ED. Please see consult note.    Patient completed dialysis and on reassessment is feeling much improved. His potassium was rechecked and is 4.4.  He has stable vital signs and can be discharged home with plan to go to his scheduled dialysis session tomorrow.  The patient appears reasonably screened and/or stabilized for discharge and I doubt any other medical condition or other [facility] requiring further screening, evaluation, or treatment in the ED at this time prior to discharge. The patient is safe for discharge with strict return precautions discussed.   Portions of this note were generated with [employer]. Dictation errors may occur despite best attempts at proofreading.   Final Clinical Impression(s) / ED Diagnoses Final diagnoses:  SOB (shortness of breath)  Hyperkalemia  End stage renal disease on dialysis [facility])    Rx / DC Orders ED Discharge Orders    None       Cherre Robins, PA-C 05/16/19 1632    Fredia Sorrow, MD 05/17/19 [phone]

## 2019-05-16 NOTE — ED Notes (Signed)
Spoke with [employer] to set patient up transport back to residence at this time. Per the EMS supervisor Rozell Searing as long as a medical necessity is filled out EMS can transport patient back home.

## 2019-05-16 NOTE — ED Triage Notes (Signed)
Per EMS. Patient states shortness of breath last night. States waking up feeling like he was "drowning." Pt is on dialysis and states he missed his treatment yesterday. NAD

## 2019-05-16 NOTE — Discharge Instructions (Addendum)
You have been seen today for shortness of breath. Please read and follow all provided instructions. Return to the emergency room for worsening condition or new concerning symptoms.    You  completed dialysis session today in the emergency department.  It is very important that you go to your scheduled dialysis tomorrow.  1. Medications: Continue usual home medications Take medications as prescribed. Please review all of the medicines and only take them if you do not have an allergy to them.   2. Treatment: rest  3. Follow Up:  Please follow up with primary care provider by scheduling an appointment as soon as possible for a visit     ?

## 2019-06-07 ENCOUNTER — Emergency Department (HOSPITAL_COMMUNITY): Payer: Medicaid Other

## 2019-06-07 ENCOUNTER — Other Ambulatory Visit: Payer: Self-pay

## 2019-06-07 ENCOUNTER — Encounter (HOSPITAL_COMMUNITY): Payer: Self-pay | Admitting: Emergency Medicine

## 2019-06-07 ENCOUNTER — Inpatient Hospital Stay (HOSPITAL_COMMUNITY)
Admission: EM | Admit: 2019-06-07 | Discharge: 2019-06-10 | DRG: 640 | Disposition: A | Discharge status: Home / Self Care | Payer: Medicaid Other | Attending: Internal Medicine | Admitting: Internal Medicine

## 2019-06-07 DIAGNOSIS — I132 Hypertensive heart and chronic kidney disease with heart failure and with stage 5 chronic kidney disease, or end stage renal disease: Secondary | ICD-10-CM | POA: Diagnosis present

## 2019-06-07 DIAGNOSIS — I509 Heart failure, unspecified: Secondary | ICD-10-CM

## 2019-06-07 DIAGNOSIS — N2581 Secondary hyperparathyroidism of renal origin: Secondary | ICD-10-CM | POA: Diagnosis present

## 2019-06-07 DIAGNOSIS — I959 Hypotension, unspecified: Secondary | ICD-10-CM | POA: Diagnosis not present

## 2019-06-07 DIAGNOSIS — Z20822 Contact with and (suspected) exposure to covid-19: Secondary | ICD-10-CM | POA: Diagnosis present

## 2019-06-07 DIAGNOSIS — E1122 Type 2 diabetes mellitus with diabetic chronic kidney disease: Secondary | ICD-10-CM | POA: Diagnosis present

## 2019-06-07 DIAGNOSIS — R531 Weakness: Secondary | ICD-10-CM

## 2019-06-07 DIAGNOSIS — Z7982 Long term (current) use of aspirin: Secondary | ICD-10-CM | POA: Diagnosis not present

## 2019-06-07 DIAGNOSIS — Z9115 Patient's noncompliance with renal dialysis: Secondary | ICD-10-CM | POA: Diagnosis not present

## 2019-06-07 DIAGNOSIS — Z7902 Long term (current) use of antithrombotics/antiplatelets: Secondary | ICD-10-CM

## 2019-06-07 DIAGNOSIS — I469 Cardiac arrest, cause unspecified: Secondary | ICD-10-CM | POA: Diagnosis present

## 2019-06-07 DIAGNOSIS — E11621 Type 2 diabetes mellitus with foot ulcer: Secondary | ICD-10-CM | POA: Diagnosis not present

## 2019-06-07 DIAGNOSIS — N186 End stage renal disease: Secondary | ICD-10-CM | POA: Diagnosis present

## 2019-06-07 DIAGNOSIS — E1142 Type 2 diabetes mellitus with diabetic polyneuropathy: Secondary | ICD-10-CM | POA: Diagnosis present

## 2019-06-07 DIAGNOSIS — I4892 Unspecified atrial flutter: Secondary | ICD-10-CM | POA: Diagnosis present

## 2019-06-07 DIAGNOSIS — E875 Hyperkalemia: Secondary | ICD-10-CM | POA: Diagnosis present

## 2019-06-07 DIAGNOSIS — Z794 Long term (current) use of insulin: Secondary | ICD-10-CM | POA: Diagnosis not present

## 2019-06-07 DIAGNOSIS — E785 Hyperlipidemia, unspecified: Secondary | ICD-10-CM | POA: Diagnosis present

## 2019-06-07 DIAGNOSIS — E1151 Type 2 diabetes mellitus with diabetic peripheral angiopathy without gangrene: Secondary | ICD-10-CM | POA: Diagnosis present

## 2019-06-07 DIAGNOSIS — Z8249 Family history of ischemic heart disease and other diseases of the circulatory system: Secondary | ICD-10-CM

## 2019-06-07 DIAGNOSIS — R001 Bradycardia, unspecified: Secondary | ICD-10-CM | POA: Diagnosis present

## 2019-06-07 DIAGNOSIS — F4310 Post-traumatic stress disorder, unspecified: Secondary | ICD-10-CM | POA: Diagnosis present

## 2019-06-07 DIAGNOSIS — I468 Cardiac arrest due to other underlying condition: Secondary | ICD-10-CM | POA: Diagnosis present

## 2019-06-07 DIAGNOSIS — Z9119 Patient's noncompliance with other medical treatment and regimen: Secondary | ICD-10-CM | POA: Diagnosis not present

## 2019-06-07 DIAGNOSIS — Z992 Dependence on renal dialysis: Secondary | ICD-10-CM

## 2019-06-07 DIAGNOSIS — Z833 Family history of diabetes mellitus: Secondary | ICD-10-CM

## 2019-06-07 DIAGNOSIS — Z89511 Acquired absence of right leg below knee: Secondary | ICD-10-CM

## 2019-06-07 DIAGNOSIS — I5022 Chronic systolic (congestive) heart failure: Secondary | ICD-10-CM

## 2019-06-07 DIAGNOSIS — D631 Anemia in chronic kidney disease: Secondary | ICD-10-CM | POA: Diagnosis present

## 2019-06-07 DIAGNOSIS — Z79899 Other long term (current) drug therapy: Secondary | ICD-10-CM | POA: Diagnosis not present

## 2019-06-07 DIAGNOSIS — E119 Type 2 diabetes mellitus without complications: Secondary | ICD-10-CM

## 2019-06-07 DIAGNOSIS — L97509 Non-pressure chronic ulcer of other part of unspecified foot with unspecified severity: Secondary | ICD-10-CM | POA: Diagnosis not present

## 2019-06-07 DIAGNOSIS — Z87891 Personal history of nicotine dependence: Secondary | ICD-10-CM

## 2019-06-07 LAB — RENAL FUNCTION PANEL
Albumin: 3.9 g/dL (ref 3.5–5.0)
Anion gap: 16 — ABNORMAL HIGH (ref 5–15)
BUN: 55 mg/dL — ABNORMAL HIGH (ref 6–20)
CO2: 24 mmol/L (ref 22–32)
Calcium: 7.4 mg/dL — ABNORMAL LOW (ref 8.9–10.3)
Chloride: 97 mmol/L — ABNORMAL LOW (ref 98–111)
Creatinine, Ser: 8.59 mg/dL — ABNORMAL HIGH (ref 0.61–1.24)
GFR calc Af Amer: 8 mL/min — ABNORMAL LOW (ref >60)
GFR calc non Af Amer: 7 mL/min — ABNORMAL LOW (ref >60)
Glucose, Bld: 123 mg/dL — ABNORMAL HIGH (ref 70–99)
Phosphorus: 6.3 mg/dL — ABNORMAL HIGH (ref 2.5–4.6)
Potassium: 7.1 mmol/L (ref 3.5–5.1)
Sodium: 137 mmol/L (ref 135–145)

## 2019-06-07 LAB — CBC WITH DIFFERENTIAL/PLATELET
Abs Immature Granulocytes: 0.15 10*3/uL — ABNORMAL HIGH (ref 0.00–0.07)
Basophils Absolute: 0.1 10*3/uL (ref 0.0–0.1)
Basophils Relative: 0 %
Eosinophils Absolute: 0 10*3/uL (ref 0.0–0.5)
Eosinophils Relative: 0 %
HCT: 36.2 % — ABNORMAL LOW (ref 39.0–52.0)
Hemoglobin: 12 g/dL — ABNORMAL LOW (ref 13.0–17.0)
Immature Granulocytes: 1 %
Lymphocytes Relative: 8 %
Lymphs Abs: 1.3 10*3/uL (ref 0.7–4.0)
MCH: 35.7 pg — ABNORMAL HIGH (ref 26.0–34.0)
MCHC: 33.1 g/dL (ref 30.0–36.0)
MCV: 107.7 fL — ABNORMAL HIGH (ref 80.0–100.0)
Monocytes Absolute: 0.4 10*3/uL (ref 0.1–1.0)
Monocytes Relative: 3 %
Neutro Abs: 13.2 10*3/uL — ABNORMAL HIGH (ref 1.7–7.7)
Neutrophils Relative %: 88 %
Platelets: 265 10*3/uL (ref 150–400)
RBC: 3.36 MIL/uL — ABNORMAL LOW (ref 4.22–5.81)
RDW: 13.9 % (ref 11.5–15.5)
WBC: 15 10*3/uL — ABNORMAL HIGH (ref 4.0–10.5)
nRBC: 0 % (ref 0.0–0.2)

## 2019-06-07 LAB — COMPREHENSIVE METABOLIC PANEL
ALT: 39 U/L (ref 0–44)
AST: 53 U/L — ABNORMAL HIGH (ref 15–41)
Albumin: 3.6 g/dL (ref 3.5–5.0)
Alkaline Phosphatase: 93 U/L (ref 38–126)
BUN: 90 mg/dL — ABNORMAL HIGH (ref 6–20)
CO2: 17 mmol/L — ABNORMAL LOW (ref 22–32)
Calcium: 7.3 mg/dL — ABNORMAL LOW (ref 8.9–10.3)
Chloride: 99 mmol/L (ref 98–111)
Creatinine, Ser: 12.27 mg/dL — ABNORMAL HIGH (ref 0.61–1.24)
GFR calc Af Amer: 5 mL/min — ABNORMAL LOW (ref >60)
GFR calc non Af Amer: 4 mL/min — ABNORMAL LOW (ref >60)
Glucose, Bld: 321 mg/dL — ABNORMAL HIGH (ref 70–99)
Potassium: 6.8 mmol/L (ref 3.5–5.1)
Sodium: 138 mmol/L (ref 135–145)
Total Bilirubin: 1.1 mg/dL (ref 0.3–1.2)
Total Protein: 6.7 g/dL (ref 6.5–8.1)

## 2019-06-07 LAB — BASIC METABOLIC PANEL
Anion gap: 17 — ABNORMAL HIGH (ref 5–15)
BUN: 59 mg/dL — ABNORMAL HIGH (ref 6–20)
CO2: 26 mmol/L (ref 22–32)
Calcium: 7.3 mg/dL — ABNORMAL LOW (ref 8.9–10.3)
Chloride: 95 mmol/L — ABNORMAL LOW (ref 98–111)
Creatinine, Ser: 9.3 mg/dL — ABNORMAL HIGH (ref 0.61–1.24)
GFR calc Af Amer: 7 mL/min — ABNORMAL LOW (ref >60)
GFR calc non Af Amer: 6 mL/min — ABNORMAL LOW (ref >60)
Glucose, Bld: 267 mg/dL — ABNORMAL HIGH (ref 70–99)
Potassium: 7 mmol/L (ref 3.5–5.1)
Sodium: 138 mmol/L (ref 135–145)

## 2019-06-07 LAB — PROTIME-INR
INR: 1.4 — ABNORMAL HIGH (ref 0.8–1.2)
Prothrombin Time: 16.9 seconds — ABNORMAL HIGH (ref 11.4–15.2)

## 2019-06-07 LAB — RESPIRATORY PANEL BY RT PCR (FLU A&B, COVID)
Influenza A by PCR: NEGATIVE
Influenza B by PCR: NEGATIVE
SARS Coronavirus 2 by RT PCR: NEGATIVE

## 2019-06-07 LAB — TSH: TSH: 3.079 u[IU]/mL (ref 0.350–4.500)

## 2019-06-07 LAB — TROPONIN I (HIGH SENSITIVITY)
Troponin I (High Sensitivity): 93 ng/L — ABNORMAL HIGH (ref <18)
Troponin I (High Sensitivity): 240 ng/L (ref <18)

## 2019-06-07 LAB — PHOSPHORUS: Phosphorus: 6.2 mg/dL — ABNORMAL HIGH (ref 2.5–4.6)

## 2019-06-07 LAB — PREPARE RBC (CROSSMATCH)

## 2019-06-07 LAB — BRAIN NATRIURETIC PEPTIDE: B Natriuretic Peptide: 3441 pg/mL — ABNORMAL HIGH (ref 0.0–100.0)

## 2019-06-07 LAB — MAGNESIUM
Magnesium: 2.3 mg/dL (ref 1.7–2.4)
Magnesium: 2.4 mg/dL (ref 1.7–2.4)

## 2019-06-07 LAB — CALCIUM: Calcium: 7.4 mg/dL — ABNORMAL LOW (ref 8.9–10.3)

## 2019-06-07 LAB — CBG MONITORING, ED: Glucose-Capillary: 151 mg/dL — ABNORMAL HIGH (ref 70–99)

## 2019-06-07 LAB — HEMOGLOBIN A1C
Hgb A1c MFr Bld: 5.2 % (ref 4.8–5.6)
Mean Plasma Glucose: 102.54 mg/dL

## 2019-06-07 LAB — LIPASE, BLOOD: Lipase: 33 U/L (ref 11–51)

## 2019-06-07 IMAGING — DX DG CHEST 1V PORT
1 series · 1 of 1 positions shown · non-contrast
Comparison: [DATE] and prior radiographs

CLINICAL DATA: Weakness and shortness of breath.

EXAM:
PORTABLE CHEST 1 VIEW

[chest ap]
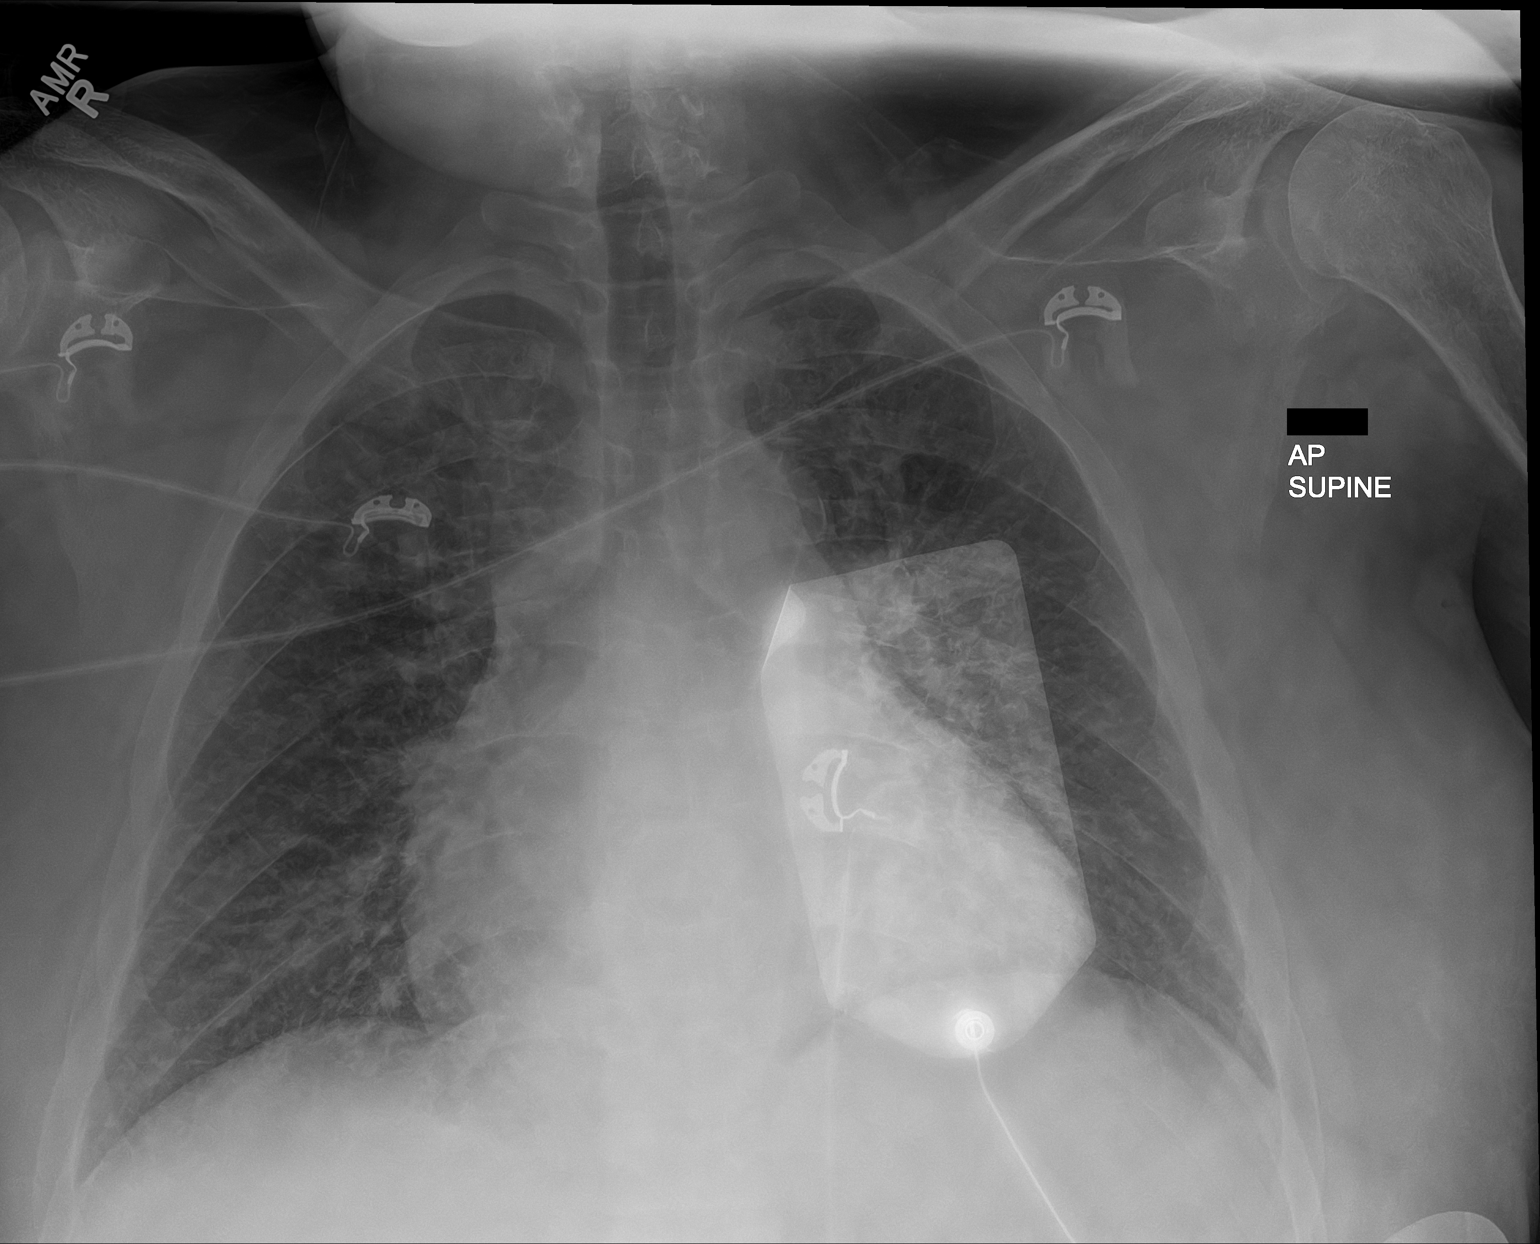

[1 of 1 positions shown; findings below may reference images not displayed]

FINDINGS: Cardiomegaly, pulmonary vascular congestion and mild interstitial
pulmonary edema noted.

No focal airspace disease, consolidation, mass, large pleural
effusion or pneumothorax noted.

A defibrillator/pacing pad overlying the LEFT chest is noted.

No acute bony abnormalities identified.
IMPRESSION: Cardiomegaly with pulmonary vascular congestion and mild
interstitial pulmonary edema.

## 2019-06-07 MED ORDER — INSULIN GLARGINE 100 UNIT/ML ~~LOC~~ SOLN
10.0000 [IU] | Freq: Every day | SUBCUTANEOUS | Status: DC
  Filled 2019-06-07 (×3): qty 0.1

## 2019-06-07 MED ORDER — SENNOSIDES-DOCUSATE SODIUM 8.6-50 MG PO TABS
1.0000 | ORAL_TABLET | Freq: Every evening | ORAL | Status: DC | PRN
  Filled 2019-06-07: qty 1

## 2019-06-07 MED ORDER — SORBITOL 70 % SOLN
30.0000 mL | Freq: Every day | Status: DC | PRN
  Filled 2019-06-07: qty 30

## 2019-06-07 MED ORDER — ATROPINE SULFATE 1 MG/10ML IJ SOSY
PREFILLED_SYRINGE | INTRAMUSCULAR | Status: AC
  Filled 2019-06-07: qty 10

## 2019-06-07 MED ORDER — ONDANSETRON HCL 4 MG PO TABS: 4.0000 mg | ORAL_TABLET | Freq: Four times a day (QID) | ORAL | Status: DC | PRN

## 2019-06-07 MED ORDER — SODIUM CHLORIDE 0.9 % IV SOLN: 100.0000 mL | INTRAVENOUS | Status: DC | PRN

## 2019-06-07 MED ORDER — ONDANSETRON HCL 4 MG/2ML IJ SOLN
4.0000 mg | Freq: Four times a day (QID) | INTRAMUSCULAR | Status: DC | PRN
  Administered 2019-06-07: 4 mg via INTRAVENOUS
  Filled 2019-06-07: qty 2

## 2019-06-07 MED ORDER — SODIUM CHLORIDE 0.9% FLUSH
3.0000 mL | Freq: Two times a day (BID) | INTRAVENOUS | Status: DC
  Administered 2019-06-08 – 2019-06-10 (×5): 3 mL via INTRAVENOUS

## 2019-06-07 MED ORDER — CALCIUM GLUCONATE-NACL 1-0.675 GM/50ML-% IV SOLN
1.0000 g | Freq: Once | INTRAVENOUS | Status: AC
  Administered 2019-06-07: 19:00:00 1000 mg via INTRAVENOUS
  Filled 2019-06-07: qty 50

## 2019-06-07 MED ORDER — LEVALBUTEROL HCL 0.63 MG/3ML IN NEBU: 0.6300 mg | INHALATION_SOLUTION | Freq: Four times a day (QID) | RESPIRATORY_TRACT | Status: DC | PRN

## 2019-06-07 MED ORDER — ASPIRIN EC 81 MG PO TBEC
81.0000 mg | DELAYED_RELEASE_TABLET | Freq: Every day | ORAL | Status: DC
  Administered 2019-06-07 – 2019-06-10 (×4): 81 mg via ORAL
  Filled 2019-06-07 (×4): qty 1

## 2019-06-07 MED ORDER — LIDOCAINE HCL (PF) 1 % IJ SOLN
5.0000 mL | Freq: Once | INTRAMUSCULAR | Status: AC
  Administered 2019-06-07: 12:00:00 5 mL via INTRADERMAL
  Filled 2019-06-07: qty 30

## 2019-06-07 MED ORDER — LIDOCAINE-PRILOCAINE 2.5-2.5 % EX CREA: 1.0000 "application " | TOPICAL_CREAM | CUTANEOUS | Status: DC | PRN

## 2019-06-07 MED ORDER — ATORVASTATIN CALCIUM 10 MG PO TABS
10.0000 mg | ORAL_TABLET | Freq: Every evening | ORAL | Status: DC
  Administered 2019-06-07 – 2019-06-09 (×3): 10 mg via ORAL
  Filled 2019-06-07 (×3): qty 1

## 2019-06-07 MED ORDER — SODIUM BICARBONATE 8.4 % IV SOLN
Freq: Once | INTRAVENOUS | Status: DC
  Filled 2019-06-07: qty 100

## 2019-06-07 MED ORDER — DOPAMINE-DEXTROSE 3.2-5 MG/ML-% IV SOLN
0.0000 ug/kg/min | INTRAVENOUS | Status: DC
  Administered 2019-06-07 (×2): 5 ug/kg/min via INTRAVENOUS
  Filled 2019-06-07 (×2): qty 250

## 2019-06-07 MED ORDER — SODIUM BICARBONATE 8.4 % IV SOLN
50.0000 meq | Freq: Once | INTRAVENOUS | Status: AC
  Administered 2019-06-07: 11:00:00 50 meq via INTRAVENOUS

## 2019-06-07 MED ORDER — HEPARIN SODIUM (PORCINE) 5000 UNIT/ML IJ SOLN: 5000.0000 [IU] | Freq: Three times a day (TID) | INTRAMUSCULAR | Status: DC

## 2019-06-07 MED ORDER — AMIODARONE HCL 200 MG PO TABS: 200.0000 mg | ORAL_TABLET | Freq: Every day | ORAL | Status: DC

## 2019-06-07 MED ORDER — CALCIUM GLUCONATE 10 % IV SOLN
1.0000 g | Freq: Once | INTRAVENOUS | Status: AC
  Administered 2019-06-07: 10:00:00 1 g via INTRAVENOUS
  Filled 2019-06-07: qty 10

## 2019-06-07 MED ORDER — FENTANYL CITRATE (PF) 100 MCG/2ML IJ SOLN
25.0000 ug | INTRAMUSCULAR | Status: DC | PRN
  Administered 2019-06-07 – 2019-06-09 (×7): 25 ug via INTRAVENOUS
  Filled 2019-06-07 (×7): qty 2

## 2019-06-07 MED ORDER — ALBUTEROL SULFATE (2.5 MG/3ML) 0.083% IN NEBU: 2.5000 mg | INHALATION_SOLUTION | RESPIRATORY_TRACT | Status: DC | PRN

## 2019-06-07 MED ORDER — PATIROMER SORBITEX CALCIUM 8.4 G PO PACK
16.8000 g | PACK | Freq: Once | ORAL | Status: AC
  Administered 2019-06-07: 17:00:00 16.8 g via ORAL
  Filled 2019-06-07: qty 2

## 2019-06-07 MED ORDER — LIDOCAINE HCL (PF) 1 % IJ SOLN: 5.0000 mL | INTRAMUSCULAR | Status: DC | PRN

## 2019-06-07 MED ORDER — CALCIUM ACETATE (PHOS BINDER) 667 MG PO CAPS
667.0000 mg | ORAL_CAPSULE | Freq: Three times a day (TID) | ORAL | Status: DC
  Administered 2019-06-07 – 2019-06-10 (×7): 667 mg via ORAL
  Filled 2019-06-07 (×16): qty 1

## 2019-06-07 MED ORDER — INSULIN ASPART 100 UNIT/ML IV SOLN
5.0000 [IU] | Freq: Once | INTRAVENOUS | Status: AC
  Administered 2019-06-07: 10:00:00 5 [IU] via INTRAVENOUS

## 2019-06-07 MED ORDER — CHLORHEXIDINE GLUCONATE CLOTH 2 % EX PADS
6.0000 | MEDICATED_PAD | Freq: Every day | CUTANEOUS | Status: DC
  Administered 2019-06-07 – 2019-06-10 (×4): 6 via TOPICAL

## 2019-06-07 MED ORDER — APIXABAN 5 MG PO TABS
5.0000 mg | ORAL_TABLET | Freq: Two times a day (BID) | ORAL | Status: DC
  Administered 2019-06-08 – 2019-06-10 (×5): 5 mg via ORAL
  Filled 2019-06-07 (×5): qty 1

## 2019-06-07 MED ORDER — PATIROMER SORBITEX CALCIUM 8.4 G PO PACK: 16.8000 g | PACK | Freq: Once | ORAL | Status: DC

## 2019-06-07 MED ORDER — PENTAFLUOROPROP-TETRAFLUOROETH EX AERO: 1.0000 "application " | INHALATION_SPRAY | CUTANEOUS | Status: DC | PRN

## 2019-06-07 MED ORDER — ATROPINE SULFATE 1 MG/ML IJ SOLN
INTRAMUSCULAR | Status: AC
  Filled 2019-06-07: qty 1

## 2019-06-07 MED ORDER — ATROPINE SULFATE 1 MG/ML IJ SOLN
0.5000 mg | Freq: Once | INTRAMUSCULAR | Status: AC
  Administered 2019-06-07: 10:00:00 0.5 mg via INTRAVENOUS
  Filled 2019-06-07: qty 1

## 2019-06-07 MED ORDER — DOCUSATE SODIUM 100 MG PO CAPS
100.0000 mg | ORAL_CAPSULE | Freq: Two times a day (BID) | ORAL | Status: DC
  Administered 2019-06-08 – 2019-06-10 (×5): 100 mg via ORAL
  Filled 2019-06-07 (×5): qty 1

## 2019-06-07 MED ORDER — HEPARIN SODIUM (PORCINE) 1000 UNIT/ML DIALYSIS
40.0000 [IU]/kg | INTRAMUSCULAR | Status: DC | PRN
  Filled 2019-06-07: qty 4

## 2019-06-07 MED ORDER — SODIUM CHLORIDE 0.9 % IV SOLN
10.0000 mL/h | Freq: Once | INTRAVENOUS | Status: AC
  Administered 2019-06-07: 12:00:00 10 mL/h via INTRAVENOUS

## 2019-06-07 MED ORDER — DIPHENHYDRAMINE HCL 25 MG PO CAPS
25.0000 mg | ORAL_CAPSULE | Freq: Once | ORAL | Status: AC
  Administered 2019-06-07: 25 mg via ORAL
  Filled 2019-06-07: qty 1

## 2019-06-07 MED ORDER — SODIUM BICARBONATE 8.4 % IV SOLN
INTRAVENOUS | Status: AC
  Filled 2019-06-07: qty 50

## 2019-06-07 MED ORDER — DEXTROSE 50 % IV SOLN
1.0000 | Freq: Once | INTRAVENOUS | Status: AC
  Administered 2019-06-07: 50 mL via INTRAVENOUS
  Filled 2019-06-07: qty 50

## 2019-06-07 NOTE — ED Provider Notes (Signed)
[facility] EMERGENCY DEPARTMENT Provider Note   CSN: [id number] Arrival date & time: 06/07/19  3500     History Chief Complaint  Patient presents with  . Cardiac Arrest    Brendan Campbell is a 49 y.o. male.  He has a history of end-stage renal disease and gets dialysis Monday Wednesday Friday.  He called EMS today after feeling weak.  During transport he became unresponsive and was asystole.  EMS assisted him breathing with a bag-valve-mask and performed CPR.  He received IV calcium from an IO placed in his left lower leg.  Patient had return of pulses after a few minutes of CPR.  He currently is awake although slow to answer.  He denies any chest pain.  The history is provided by the patient and the EMS personnel.  Cardiac Arrest Witnessed by:  Healthcare provider Incident location:  En route to the ED Time before ALS initiated:  Immediate Condition upon EMS arrival:  Alert/oriented Pulse:  Present Initial cardiac rhythm per EMS:  Asystole Treatments prior to arrival:  ACLS protocol Meds prior to arrival: calcium. Airway:  Bag valve mask Rhythm on admission to ED:  Junctional      Past Medical History:  Diagnosis Date  . Anemia   . Anxiety   . Blood transfusion without reported diagnosis   . CHF (congestive heart failure) ([place])   . Chronic kidney disease   . Depression   . Diabetes mellitus without complication ([place])   . End stage renal disease ([place])    M/W/F [facility]  . Hyperlipidemia   . Neuropathy   . Peripheral vascular disease ([place])   . PTSD (post-traumatic stress disorder)     Patient Active Problem List   Diagnosis Date Noted  . Atrial flutter ([place])   . CHF (congestive heart failure) ([place])   . Neuropathy   . Peripheral vascular disease ([place])   . Abnormal EKG   . Leukocytosis   . Atypical pneumonia   . ESRD needing dialysis ([place])   . Acute pulmonary edema (HCC)   . ESRD on hemodialysis ([place])   . Hyperkalemia 10/05/2018  . Weakness 10/05/2018  .  Chest pain 05/10/2016  . Anemia due to end stage renal disease ([place]) 05/10/2016  . 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 Lumbar radiculopathy 03/08/2016  . 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 DM2 (diabetes mellitus, type 2) ([place]) 03/03/2015  . HLD (hyperlipidemia) 03/03/2015  . Back pain 03/03/2015    Past Surgical History:  Procedure Laterality Date  . A/V FISTULAGRAM N/A 10/09/2018   Procedure: A/V FISTULAGRAM;  Surgeon: Serafina Mitchell, MD;  Location: [place] [facility];  Service: Cardiovascular;  Laterality: N/A;  . AV FISTULA PLACEMENT Left 09/22/2016   Procedure: CREATION OF LEFT ARM ARTERIOVENOUS (AV) FISTULA;  Surgeon: Waynetta Sandy, MD;  Location: [place];  Service: Vascular;  Laterality: Left;  . AV FISTULA PLACEMENT Left 10/31/2017   Procedure: INSERTION OF ARTERIOVENOUS (AV) GORE-TEX 4-90mm STETCH GRAFT LEFT ARM;  Surgeon: Waynetta Sandy, MD;  Location: [place];  Service: Vascular;  Laterality: Left;  . BASCILIC VEIN TRANSPOSITION Left 08/17/2017   Procedure: SECOND STAGE BASILIC VEIN TRANSPOSITION LEFT ARM;  Surgeon: Waynetta Sandy, MD;  Location: [place];  Service: Vascular;  Laterality: Left;  . BELOW KNEE LEG AMPUTATION Right   . CARDIOVERSION N/A 02/19/2019   Procedure: CARDIOVERSION;  Surgeon: [name], MD;  Location: [place];  Service: Cardiovascular;  Laterality: N/A;  .  CHOLECYSTECTOMY    . FOOT SURGERY    . IR FLUORO GUIDE CV LINE RIGHT  05/16/2016  . IR FLUORO GUIDE CV LINE RIGHT  02/16/2019  . IR REMOVAL TUN CV CATH W/O FL  05/16/2016  . IR REMOVAL TUN CV CATH W/O FL  02/19/2019  . IR THROMBECTOMY AV FISTULA W/THROMBOLYSIS/PTA INC/SHUNT/IMG LEFT Left 11/09/2018  . IR US GUIDE VASC ACCESS LEFT  11/09/2018  . IR US GUIDE VASC ACCESS RIGHT  05/16/2016  . IR US GUIDE VASC ACCESS RIGHT  02/16/2019  . PERIPHERAL VASCULAR BALLOON ANGIOPLASTY  10/09/2018   Procedure: PERIPHERAL VASCULAR BALLOON ANGIOPLASTY;  Surgeon: Serafina Mitchell, MD;  Location: [place] [facility];  Service: Cardiovascular;;  . TEE WITHOUT CARDIOVERSION N/A 02/19/2019   Procedure: TRANSESOPHAGEAL ECHOCARDIOGRAM (TEE);  Surgeon: [name], MD;  Location: [facility] ENDOSCOPY;  Service: Cardiovascular;  Laterality: N/A;       Family History  Problem Relation Age of Onset  . Cancer Mother        lung  . Diabetes Mother   . Heart attack Father   . Diabetes Father   . Diabetes Sister     Social History   Tobacco Use  . Smoking status: Former Smoker    Quit date: 09/03/2006    Years since quitting: 12.7  . Smokeless tobacco: Never Used  Substance Use Topics  . Alcohol use: No  . Drug use: No    Home Medications Prior to Admission medications   Medication Sig Start Date End Date Taking? Authorizing Provider  amiodarone (PACERONE) 200 MG tablet Take 1 tablet (200 mg total) by mouth daily. 02/21/19   Donnamae Jude, MD  apixaban (ELIQUIS) 5 MG TABS tablet Take 1 tablet (5 mg total) by mouth 2 (two) times daily. 02/20/19   Donnamae Jude, MD  aspirin 81 MG chewable tablet Chew 81 mg by mouth daily.    [provider]  atorvastatin (LIPITOR) 10 MG tablet Take 1 tablet (10 mg total) by mouth every evening. 02/20/19   Donnamae Jude, MD  calcium acetate (PHOSLO) 667 MG capsule Take 1 capsule (667 mg total) by mouth 3 (three) times daily with meals. 02/21/19   Donnamae Jude, MD  LANTUS SOLOSTAR 100 UNIT/ML Solostar Pen Inject 10 Units into the skin at bedtime.  01/18/19   [provider]  lidocaine-prilocaine (EMLA) cream Apply 1 application topically See admin instructions. 05/06/19   [provider]  multivitamin (RENA-VIT) TABS tablet Take 1 tablet by mouth daily. 08/04/18   [provider]  tiZANidine (ZANAFLEX) 4 MG tablet Take 4 mg by mouth daily as needed. 04/07/19   [provider]    Allergies    Tape  Review of Systems   Review of Systems  Unable to perform ROS: Acuity of condition     Physical Exam Updated Vital Signs BP (!) 149/68   Pulse (!) 37   Temp 97.6 F (36.4 C) (Oral)   Resp (!) 23   Ht 6' (1.829 m)   Wt 98.4 kg   SpO2 93%   BMI 29.42 kg/m   Physical Exam Vitals and nursing note reviewed.  Constitutional:      Appearance: He is well-developed. He is diaphoretic.  HENT:     Head: Normocephalic and atraumatic.  Eyes:     Conjunctiva/sclera: Conjunctivae normal.  Cardiovascular:     Rate and Rhythm: Normal rate and regular rhythm.     Heart sounds: No murmur.  Pulmonary:     Effort: Pulmonary effort is normal. No respiratory distress.     Breath sounds: Normal breath sounds.  Abdominal:     Palpations: Abdomen is soft.     Tenderness: There is no abdominal tenderness. There is no guarding or rebound.  Musculoskeletal:        General: Normal range of motion.     Cervical back: Neck supple.     Comments: Fistula with positive thrill left upper arm.  IO line left lower leg.  BKA right lower leg.  Skin:    Coloration: Skin is pale.  Neurological:     General: No focal deficit present.     Mental Status: He is alert.     ED Results / Procedures / Treatments   Labs (all labs ordered are listed, but only abnormal results are displayed) Labs Reviewed  PROTIME-INR - Abnormal; Notable for the following components:      Result Value   Prothrombin Time 16.9 (*)    INR 1.4 (*)    All other components within normal limits  BRAIN NATRIURETIC PEPTIDE - Abnormal; Notable for the following components:   B Natriuretic Peptide 3,441.0 (*)    All other components within normal limits  COMPREHENSIVE METABOLIC PANEL - Abnormal; Notable for the following components:   Potassium 6.8 (*)    CO2 17 (*)    Glucose, Bld 321 (*)    BUN 90 (*)    Creatinine, Ser 12.27 (*)    Calcium 7.3 (*)    AST 53 (*)    GFR calc non Af Amer 4 (*)    GFR calc Af Amer 5 (*)    All other components within normal limits  CBC WITH DIFFERENTIAL/PLATELET - Abnormal;  Notable for the following components:   WBC 15.0 (*)    RBC 3.36 (*)    Hemoglobin 12.0 (*)    HCT 36.2 (*)    MCV 107.7 (*)    MCH 35.7 (*)    Neutro Abs 13.2 (*)    Abs Immature Granulocytes 0.15 (*)    All other components within normal limits  RENAL FUNCTION PANEL - Abnormal; Notable for the following components:   Potassium 7.1 (*)    Chloride 97 (*)    Glucose, Bld 123 (*)    BUN 55 (*)    Creatinine, Ser 8.59 (*)    Calcium 7.4 (*)    Phosphorus 6.3 (*)    GFR calc non Af Amer 7 (*)    GFR calc Af Amer 8 (*)    Anion gap 16 (*)    All other components within normal limits  CALCIUM - Abnormal; Notable for the following components:   Calcium 7.4 (*)    All other components within normal limits  PHOSPHORUS - Abnormal; Notable for the following components:   Phosphorus 6.2 (*)    All other components within normal limits  CBG MONITORING, ED - Abnormal; Notable for the following components:   Glucose-Capillary 151 (*)    All other components within normal limits  TROPONIN I (HIGH SENSITIVITY) - Abnormal; Notable for the following components:   Troponin I (High Sensitivity) 93 (*)    All other components within normal limits  TROPONIN I (HIGH SENSITIVITY) - Abnormal; Notable for the following components:   Troponin I (High Sensitivity) 240 (*)    All other components within normal limits  RESPIRATORY PANEL BY RT PCR (FLU A&B, COVID)  URINE CULTURE  EXPECTORATED SPUTUM ASSESSMENT W  REFEX TO RESP CULTURE  MRSA PCR SCREENING  LIPASE, BLOOD  MAGNESIUM  TSH  CBC WITH DIFFERENTIAL/PLATELET  HEMOGLOBIN A1C  URINALYSIS, ROUTINE W REFLEX MICROSCOPIC  RENAL FUNCTION PANEL  CBC  PROTIME-INR  APTT  I-STAT CHEM 8, ED  TYPE AND SCREEN  PREPARE RBC (CROSSMATCH)    EKG EKG Interpretation  Date/Time:  Friday June 07 2019 09:54:38 EDT Ventricular Rate:  40 PR Interval:    QRS Duration: 201 QT Interval:  540 QTC Calculation: 441 R Axis:   101 Text  Interpretation: Junctional rhythm Nonspecific intraventricular conduction delay Nonspecific ST depression Confirmed by Aletta Edouard [phone]) on 06/07/2019 9:58:00 AM   Radiology DG Chest Port 1 View  Result Date: 06/07/2019 CLINICAL DATA:  Weakness and shortness of breath. EXAM: PORTABLE CHEST 1 VIEW COMPARISON:  05/16/2019 and prior radiographs FINDINGS: Cardiomegaly, pulmonary vascular congestion and mild interstitial pulmonary edema noted. No focal airspace disease, consolidation, mass, large pleural effusion or pneumothorax noted. A defibrillator/pacing pad overlying the LEFT chest is noted. No acute bony abnormalities identified. IMPRESSION: Cardiomegaly with pulmonary vascular congestion and mild interstitial pulmonary edema. Electronically Signed   By: Margarette Canada M.D.   On: 06/07/2019 10:10    Procedures .Critical Care Performed by: Hayden Rasmussen, MD Authorized by: Hayden Rasmussen, MD   Critical care provider statement:    Critical care time (minutes):  120   Critical care time was exclusive of:  Separately billable procedures and treating other patients   Critical care was necessary to treat or prevent imminent or life-threatening deterioration of the following conditions:  Cardiac failure, circulatory failure, CNS failure or compromise and metabolic crisis   Critical care was time spent personally by me on the following activities:  Discussions with consultants, evaluation of patient's response to treatment, examination of patient, ordering and performing treatments and interventions, ordering and review of laboratory studies, ordering and review of radiographic studies, pulse oximetry, re-evaluation of patient's condition, obtaining history from patient or surrogate, review of old charts and development of treatment plan with patient or surrogate   I assumed direction of critical care for this patient from another provider in my specialty: no    Temporary pacer  Date/Time:  06/07/2019 12:49 PM Performed by: Hayden Rasmussen, MD Authorized by: Hayden Rasmussen, MD  Consent: The procedure was performed in an emergent situation. Comments: Patient was transcutaneously paced due to symptomatic bradycardia.  We had good capture set a rate of 80.  The patient was on this for approximately 120 minutes and was successfully taken off it with his heart rate and blood pressure remaining stable.    (including critical care time)  Medications Ordered in ED Medications  DOPamine (INTROPIN) 800 mg in dextrose 5 % 250 mL (3.2 mg/mL) infusion (0 mcg/kg/min  98.4 kg Intravenous Stopped 06/07/19 1333)  Chlorhexidine Gluconate Cloth 2 % PADS 6 each (6 each Topical Given 06/07/19 1832)  sodium chloride flush (NS) 0.9 % injection 3 mL (has no administration in time range)  docusate sodium (COLACE) capsule 100 mg (has no administration in time range)  senna-docusate (Senokot-S) tablet 1 tablet (has no administration in time range)  sorbitol 70 % solution 30 mL (has no administration in time range)  ondansetron (ZOFRAN) tablet 4 mg (has no administration in time range)    Or  ondansetron (ZOFRAN) injection 4 mg (has no administration in time range)  albuterol (PROVENTIL) (2.5 MG/3ML) 0.083% nebulizer solution 2.5 mg (has no administration in time range)  aspirin EC  tablet 81 mg (81 mg Oral Given 06/07/19 1726)  amiodarone (PACERONE) tablet 200 mg (has no administration in time range)  atorvastatin (LIPITOR) tablet 10 mg (10 mg Oral Given 06/07/19 1723)  insulin glargine (LANTUS) injection 10 Units (has no administration in time range)  calcium acetate (PHOSLO) capsule 667 mg (667 mg Oral Given 06/07/19 1840)  apixaban (ELIQUIS) tablet 5 mg (has no administration in time range)  calcium gluconate inj 10% (1 g) URGENT USE ONLY! (1 g Intravenous Given 06/07/19 1005)  insulin aspart (novoLOG) injection 5 Units (5 Units Intravenous Given 06/07/19 1006)    And  dextrose 50 % solution 50 mL  (50 mLs Intravenous Given 06/07/19 1006)  0.9 %  sodium chloride infusion (10 mL/hr Intravenous New Bag/Given 06/07/19 1227)  atropine injection 0.5 mg (0.5 mg Intravenous Given 06/07/19 1006)  sodium bicarbonate injection 50 mEq (50 mEq Intravenous Given 06/07/19 1054)  lidocaine (PF) (XYLOCAINE) 1 % injection 5 mL (5 mLs Intradermal Given 06/07/19 1211)  patiromer Daryll Drown) packet 16.8 g (16.8 g Oral Given 06/07/19 1722)    ED Course  I have reviewed the triage vital signs and the nursing notes.  Pertinent labs & imaging results that were available during my care of the patient were reviewed by me and considered in my medical decision making (see chart for details).  Clinical Course as of Jun 07 1903  Fri Jun 07, 2019  1006 Patient braiding down.  Pacer pads on.  I-STAT showing potassium greater than 6.5.  Ordered calcium ordered bicarb ordered insulin dextrose.  Hemoglobin low.  Ordered emergent blood.  Atropine ordered.   [MB]  54 Dr. Leeanne Rio down to see patient and is trying to get him set up for dialysis.  Discussed with Dr. Harl Bowie cardiology.  Discussed with ICU attending here who recommended trying some peripheral dopamine   [MB]  1101 Patient bradycardia down here and ultimately we put him on transcutaneous pacing.  Even with that he lost his thrill through the fistula so probably had about 2 or 3 minutes of CPR.  1 dose of epi.  1 amp of bicarb.  Currently now is capturing transcutaneously.  Mental status is sluggish but he arouses to voice.  Dialysis is being set up in the room.  Dr.:Caldonado nephrology very helpful in resuscitation.   [MB]  1140 Patient on dialysis pump now.  Dr. Harl Bowie from cardiology in to see the patient.  Likely will try to get him off the transcutaneous pacer as his electrolytes corrected.   [MB]  7353 Removed IO line left lower leg.   [MB]  2992 Labs starting to result.  Hemoglobin actually not low and stable at 12.  Potassium elevated at 6.8.  Creatinine  and BUN elevated.  Glucose elevated.  Calcium low at 7.3.   [MB]  1248 Discussed with [facility] hospitalist Dr. Fleeta Emmer who will evaluate the patient for admission.   [MB]    Clinical Course User Index [MB] Hayden Rasmussen, MD   MDM Rules/Calculators/A&P                     This patient complains of shortness of breath weakness unresponsive episode; this involves an extensive number of treatment Options and is a complaint that carries with it a high risk of complications and Morbidity. The differential includes metabolic derangement with hyperkalemia, CHF, ACS, anemia, pneumothorax  I ordered, reviewed and interpreted labs, which included significantly elevated calcium along with expected elevated of creatinine and BUN.  Troponin also elevated likely due to shock and hypotension I ordered medication insulin dextrose calcium bicarb epinephrine atropine I ordered imaging studies which included portable chest and I independently    visualized and interpreted imaging which showed no gross infiltrates Additional history obtained from EMS who transported patient Previous records obtained and reviewed in epic including prior hypokalemic episodes I consulted nephrology Dr. Leeanne Rio, cardiology Dr. Harl Bowie and critical care Dr. Elsworth Soho along with [facility] hospitalist Dr. Roger Shelter and discussed lab and imaging findings  Critical Interventions: Multiple including resuscitation, management of hyperkalemia, management of bradycardic arrest with transcutaneous pacing  After the interventions stated above, I reevaluated the patient and found to be improved, resolved hypotension and bradycardia, will still need admission for further correction of metabolic derangements and assessment of stability   Final Clinical Impression(s) / ED Diagnoses Final diagnoses:  Cardiac arrest ([place])  Hyperkalemia  Bradycardia  Acute on chronic congestive heart failure, unspecified heart failure type [facility])    Rx / DC  Orders ED Discharge Orders    None       Hayden Rasmussen, MD 06/07/19 Einar Crow

## 2019-06-07 NOTE — Consult Note (Signed)
NAME:  Brendan Campbell, MRN:  [id number], DOB:  [birth year], LOS: 0 ADMISSION DATE:  06/07/2019, CONSULTATION DATE:  06/07/2019  REFERRING MD:  Sheppard Plumber, CHIEF COMPLAINT:  Cardiac arrest   Brief History   49 year old with ESRD on HD, admitted with hyperkalemia due to missed HD, brief cardiac arrest, junctional PEA, resuscitated with treatment of hyperkalemia and required emergent dialysis  History of present illness   49 year old BIB EMS , alert on talking on arrival but became unresponsive on route with asystole, CPR for 30 seconds given calcium for hyperkalemia, last dialysis treatment was Monday.  He has a history of missing dialysis on Wednesdays Had second  episode of brief arrest in the ED requiring 1 round of CPR and epinephrine Initial labs showed potassium >8.5 on istat, BMET 7.1, PEA junctional rhythm, required transcutaneous pacing.  Had 2 more episodes from 10 32-10 42 AM with pulse was lost briefly I discussed with the EDP and suggested starting dopamine Emergent hemodialysis was instituted Seen by cardiology and renal in the ED, have reviewed their consultation reports He has had multiple admissions for volume overload and hyperkalemia  Past Medical History  ESRD on HD Chronic systolic heart failure, EF 25 to 30% Severe PAD status post right BKA Diabetes type 2 Atrial flutter on amiodarone and Eliquis  Significant Hospital Events     Consults:  Cardiology Renal  Procedures:  Transcutaneous pacing 4/30  Significant Diagnostic Tests:  TEE 02/2019 EF 20 to 25%  Micro Data:    Antimicrobials:     Interim history/subjective:    Objective   Blood pressure (!) 158/95, pulse 88, resp. rate 11, height 6' (1.829 m), weight 98.4 kg, SpO2 100 %.       No intake or output data in the 24 hours ending 06/07/19 1450 Filed Weights   06/07/19 0957  Weight: 98.4 kg    Examination: General: Middle-aged man, no distress, lying supine HENT: Mild pallor, no icterus, no  JVD Lungs: Clear breath sounds bilateral Cardiovascular: S1-S2 regular, left arm  thrill over aVF Abdomen: Soft, nontender Extremities: Right BKA Neuro: Confused, grossly nonfocal   Chest x-ray personally reviewed which shows cardiomegaly with interstitial edema Labs show potassium 7.1, creatinine 11, no leukocytosis, low troponins Repeat potassium was 6.8  Resolved Hospital Problem list     Assessment & Plan:  Cardiac arrest due to hyperkalemia and missed dialysis  Bradycardia/junctional rhythm -due to hyperkalemia, now resolved -Taper dopamine to off  Hyperkalemia/ESRD -Still has peaked T waves, will need repeat potassium check postdialysis  Chronic systolic heart failure Atrial flutter -Hold amiodarone for now, no beta-blockade   PCCM will be available as needed Renal service raises important question of whether his multiple admissions and missed dialysis indicates passive attempts at self-harm versus poor compliance for other reasons and obtaining psychiatric consultation prior to discharge    Labs   CBC: Recent Labs  Lab 06/07/19 1126  WBC 15.0*  NEUTROABS 13.2*  HGB 12.0*  HCT 36.2*  MCV 107.7*  PLT 147    Basic Metabolic Panel: Recent Labs  Lab 06/07/19 1125  NA 138  K 6.8*  CL 99  CO2 17*  GLUCOSE 321*  BUN 90*  CREATININE 12.27*  CALCIUM 7.3*   GFR: Estimated Creatinine Clearance: 8.9 mL/min (A) (by C-G formula based on SCr of 12.27 mg/dL (H)). Recent Labs  Lab 06/07/19 1126  WBC 15.0*    Liver Function Tests: Recent Labs  Lab 06/07/19 1125  AST 53*  ALT 39  ALKPHOS 93  BILITOT 1.1  PROT 6.7  ALBUMIN 3.6   Recent Labs  Lab 06/07/19 1125  LIPASE 33   No results for input(s): AMMONIA in the last 168 hours.  ABG    Component Value Date/Time   TCO2 21 (L) 11/09/2018 1613     Coagulation Profile: Recent Labs  Lab 06/07/19 1125  INR 1.4*    Cardiac Enzymes: No results for input(s): CKTOTAL, CKMB, CKMBINDEX,  TROPONINI in the last 168 hours.  HbA1C: HB A1C (BAYER DCA - WAIVED)  Date/Time Value Ref Range Status  12/16/2015 09:04 AM 5.4 <7.0 % Final    Comment:                                          Diabetic Adult            <7.0                                       Healthy Adult        4.3 - 5.7                                                           (DCCT/NGSP) American Diabetes Association's Summary of Glycemic Recommendations for Adults with Diabetes: Hemoglobin A1c <7.0%. More stringent glycemic goals (A1c <6.0%) may further reduce complications at the cost of increased risk of hypoglycemia.   09/15/2015 08:22 AM 6.5 <7.0 % Final    Comment:                                          Diabetic Adult            <7.0                                       Healthy Adult        4.3 - 5.7                                                           (DCCT/NGSP) American Diabetes Association's Summary of Glycemic Recommendations for Adults with Diabetes: Hemoglobin A1c <7.0%. More stringent glycemic goals (A1c <6.0%) may further reduce complications at the cost of increased risk of hypoglycemia.    Hgb A1c MFr Bld  Date/Time Value Ref Range Status  02/15/2019 05:00 AM 5.4 4.8 - 5.6 % Final    Comment:    (NOTE) Pre diabetes:          5.7%-6.4% Diabetes:              >6.4% Glycemic control for   <7.0% adults with diabetes   10/05/2018 04:17 PM 5.2 4.8 - 5.6 % Final    Comment:    (NOTE)  Pre diabetes:          5.7%-6.4% Diabetes:              >6.4% Glycemic control for   <7.0% adults with diabetes     CBG: Recent Labs  Lab 06/07/19 0950  GLUCAP 151*    Review of Systems:   Unable to obtain , examined on HD  Past Medical History  He,  has a past medical history of Anemia, Anxiety, Blood transfusion without reported diagnosis, CHF (congestive heart failure) ([place]), Chronic kidney disease, Depression, Diabetes mellitus without complication ([place]), End stage renal disease ([place]),  Hyperlipidemia, Neuropathy, Peripheral vascular disease ([place]), and PTSD (post-traumatic stress disorder).   Surgical History    Past Surgical History:  Procedure Laterality Date  . A/V FISTULAGRAM N/A 10/09/2018   Procedure: A/V FISTULAGRAM;  Surgeon: Serafina Mitchell, MD;  Location: [place] [facility];  Service: Cardiovascular;  Laterality: N/A;  . AV FISTULA PLACEMENT Left 09/22/2016   Procedure: CREATION OF LEFT ARM ARTERIOVENOUS (AV) FISTULA;  Surgeon: Waynetta Sandy, MD;  Location: [place];  Service: Vascular;  Laterality: Left;  . AV FISTULA PLACEMENT Left 10/31/2017   Procedure: INSERTION OF ARTERIOVENOUS (AV) GORE-TEX 4-56mm STETCH GRAFT LEFT ARM;  Surgeon: Waynetta Sandy, MD;  Location: [place];  Service: Vascular;  Laterality: Left;  . BASCILIC VEIN TRANSPOSITION Left 08/17/2017   Procedure: SECOND STAGE BASILIC VEIN TRANSPOSITION LEFT ARM;  Surgeon: Waynetta Sandy, MD;  Location: [place];  Service: Vascular;  Laterality: Left;  . BELOW KNEE LEG AMPUTATION Right   . CARDIOVERSION N/A 02/19/2019   Procedure: CARDIOVERSION;  Surgeon: [name], MD;  Location: [facility] ENDOSCOPY;  Service: Cardiovascular;  Laterality: N/A;  . CHOLECYSTECTOMY    . FOOT SURGERY    . IR FLUORO GUIDE CV LINE RIGHT  05/16/2016  . IR FLUORO GUIDE CV LINE RIGHT  02/16/2019  . IR REMOVAL TUN CV CATH W/O FL  05/16/2016  . IR REMOVAL TUN CV CATH W/O FL  02/19/2019  . IR THROMBECTOMY AV FISTULA W/THROMBOLYSIS/PTA INC/SHUNT/IMG LEFT Left 11/09/2018  . IR US GUIDE VASC ACCESS LEFT  11/09/2018  . IR US GUIDE VASC ACCESS RIGHT  05/16/2016  . IR US GUIDE VASC ACCESS RIGHT  02/16/2019  . PERIPHERAL VASCULAR BALLOON ANGIOPLASTY  10/09/2018   Procedure: PERIPHERAL VASCULAR BALLOON ANGIOPLASTY;  Surgeon: Serafina Mitchell, MD;  Location: [place] [facility];  Service: Cardiovascular;;  . TEE WITHOUT CARDIOVERSION N/A 02/19/2019   Procedure: TRANSESOPHAGEAL ECHOCARDIOGRAM (TEE);  Surgeon: [name], MD;   Location: [facility] ENDOSCOPY;  Service: Cardiovascular;  Laterality: N/A;     Social History   reports that he quit smoking about 12 years ago. He has never used smokeless tobacco. He reports that he does not drink alcohol or use drugs.   Family History   His family history includes Cancer in his mother; Diabetes in his father, mother, and sister; Heart attack in his father.   Allergies Allergies  Allergen Reactions  . Tape Other (See Comments)    Pulls skin off     Home Medications  Prior to Admission medications   Medication Sig Start Date End Date Taking? Authorizing Provider  amiodarone (PACERONE) 200 MG tablet Take 1 tablet (200 mg total) by mouth daily. 02/21/19   Donnamae Jude, MD  apixaban (ELIQUIS) 5 MG TABS tablet Take 1 tablet (5 mg total) by mouth 2 (two) times daily. 02/20/19   Donnamae Jude, MD  aspirin 81  MG chewable tablet Chew 81 mg by mouth daily.    [provider]  atorvastatin (LIPITOR) 10 MG tablet Take 1 tablet (10 mg total) by mouth every evening. 02/20/19   Donnamae Jude, MD  calcium acetate (PHOSLO) 667 MG capsule Take 1 capsule (667 mg total) by mouth 3 (three) times daily with meals. 02/21/19   Donnamae Jude, MD  LANTUS SOLOSTAR 100 UNIT/ML Solostar Pen Inject 10 Units into the skin at bedtime.  01/18/19   [provider]  lidocaine-prilocaine (EMLA) cream Apply 1 application topically See admin instructions. 05/06/19   [provider]  multivitamin (RENA-VIT) TABS tablet Take 1 tablet by mouth daily. 08/04/18   [provider]  tiZANidine (ZANAFLEX) 4 MG tablet Take 4 mg by mouth daily as needed. 04/07/19   [provider]     Kara Mead MD. FCCP. [facility]  If no response to pager , please call [phone]   06/07/2019

## 2019-06-07 NOTE — H&P (Signed)
History and Physical   Patient: Brendan Campbell                            PCP: Monico Blitz, MD                    DOB: 04-13-1970            DOA: 06/07/2019 ZTI:[id number]             DOS: 06/07/2019, 1:37 PM  Patient coming from:   HOME  I have personally reviewed patient's medical records, in electronic medical records, including:  [place] link, and care everywhere.    Chief Complaint:   Chief Complaint  Patient presents with  . Cardiac Arrest    History of present illness:    MURDOCK JELLISON is a 49 y.o. male with medical history significant of ESRD on HD  MWF,  DM II, HTN, HLD, PTSD, sCHF with EJF 25-30%, PVD S/p rBKA,  .earache ect. Presented with cardiac arrest asystole x2, s/p brief CPR, one dose of epi.,  missed hemodialysis on Wednesday, potassium > 8.5.  Status post emergent hemodialysis, patient has been stabilized.    Patient Denies having: Reports of mild shortness of breath complaining of mild chest pain, post CPR. Denies:Fever, Chills, Cough, , Abd pain, N/V/D, headache, dizziness, lightheadedness,  Dysuria, Joint pain, rash, open wounds   ------------------------------------------------------------------------------------------------------  ED Course:   Presented with worsening shortness of breath and not feeling well.... Patient has missed his hemodialysis on Wednesday. (Apparently patient has had multiple admission for pulmonary edema volume overload and hyperkalemia due to noncompliance with hemodialysis) In route patient became unresponsive, asystole with brief CPR -calcium and epinephrine was given patient became alert. In ED patient was found lethargic, potassium was noted to be greater than 6.5, bradycardia heart rate of 30, external pacing was initiated, urgent hemodialysis initiated, D50 was given -patient had another asystole, pulseless cardiac arrest, CPR was initiated 1 mcg of epinephrine was given pulse was established, patient was started on dopamine  drip. ----------------------------------------------------------------------------------------------------------  Review of Systems: As per HPI, otherwise 10 point review of systems were negative.   -----------------------------------------------------------------------------------------------------------------  Allergies  Allergen Reactions  . Tape Other (See Comments)    Pulls skin off    Home MEDs:  Prior to Admission medications   Medication Sig Start Date End Date Taking? Authorizing Provider  amiodarone (PACERONE) 200 MG tablet Take 1 tablet (200 mg total) by mouth daily. 02/21/19   Donnamae Jude, MD  apixaban (ELIQUIS) 5 MG TABS tablet Take 1 tablet (5 mg total) by mouth 2 (two) times daily. 02/20/19   Donnamae Jude, MD  aspirin 81 MG chewable tablet Chew 81 mg by mouth daily.    [provider]  atorvastatin (LIPITOR) 10 MG tablet Take 1 tablet (10 mg total) by mouth every evening. 02/20/19   Donnamae Jude, MD  calcium acetate (PHOSLO) 667 MG capsule Take 1 capsule (667 mg total) by mouth 3 (three) times daily with meals. 02/21/19   Donnamae Jude, MD  LANTUS SOLOSTAR 100 UNIT/ML Solostar Pen Inject 10 Units into the skin at bedtime.  01/18/19   [provider]  lidocaine-prilocaine (EMLA) cream Apply 1 application topically See admin instructions. 05/06/19   [provider]  multivitamin (RENA-VIT) TABS tablet Take 1 tablet by mouth daily. 08/04/18   [provider]  tiZANidine (ZANAFLEX) 4 MG tablet Take 4 mg by  mouth daily as needed. 04/07/19   [provider]    PRN MEDs: albuterol, levalbuterol, ondansetron **OR** ondansetron (ZOFRAN) IV, senna-docusate, sorbitol  Past Medical History:  Diagnosis Date  . Anemia   . Anxiety   . Blood transfusion without reported diagnosis   . CHF (congestive heart failure) ([place])   . Chronic kidney disease   . Depression   . Diabetes mellitus without complication ([place])   . End stage renal  disease ([place])    M/W/F [facility]  . Hyperlipidemia   . Neuropathy   . Peripheral vascular disease ([place])   . PTSD (post-traumatic stress disorder)     Past Surgical History:  Procedure Laterality Date  . A/V FISTULAGRAM N/A 10/09/2018   Procedure: A/V FISTULAGRAM;  Surgeon: Serafina Mitchell, MD;  Location: [place] [facility];  Service: Cardiovascular;  Laterality: N/A;  . AV FISTULA PLACEMENT Left 09/22/2016   Procedure: CREATION OF LEFT ARM ARTERIOVENOUS (AV) FISTULA;  Surgeon: Waynetta Sandy, MD;  Location: [place];  Service: Vascular;  Laterality: Left;  . AV FISTULA PLACEMENT Left 10/31/2017   Procedure: INSERTION OF ARTERIOVENOUS (AV) GORE-TEX 4-78mm STETCH GRAFT LEFT ARM;  Surgeon: Waynetta Sandy, MD;  Location: [place];  Service: Vascular;  Laterality: Left;  . BASCILIC VEIN TRANSPOSITION Left 08/17/2017   Procedure: SECOND STAGE BASILIC VEIN TRANSPOSITION LEFT ARM;  Surgeon: Waynetta Sandy, MD;  Location: [place];  Service: Vascular;  Laterality: Left;  . BELOW KNEE LEG AMPUTATION Right   . CARDIOVERSION N/A 02/19/2019   Procedure: CARDIOVERSION;  Surgeon: [name], MD;  Location: [facility] ENDOSCOPY;  Service: Cardiovascular;  Laterality: N/A;  . CHOLECYSTECTOMY    . FOOT SURGERY    . IR FLUORO GUIDE CV LINE RIGHT  05/16/2016  . IR FLUORO GUIDE CV LINE RIGHT  02/16/2019  . IR REMOVAL TUN CV CATH W/O FL  05/16/2016  . IR REMOVAL TUN CV CATH W/O FL  02/19/2019  . IR THROMBECTOMY AV FISTULA W/THROMBOLYSIS/PTA INC/SHUNT/IMG LEFT Left 11/09/2018  . IR US GUIDE VASC ACCESS LEFT  11/09/2018  . IR US GUIDE VASC ACCESS RIGHT  05/16/2016  . IR US GUIDE VASC ACCESS RIGHT  02/16/2019  . PERIPHERAL VASCULAR BALLOON ANGIOPLASTY  10/09/2018   Procedure: PERIPHERAL VASCULAR BALLOON ANGIOPLASTY;  Surgeon: Serafina Mitchell, MD;  Location: [place] [facility];  Service: Cardiovascular;;  . TEE WITHOUT CARDIOVERSION N/A 02/19/2019   Procedure: TRANSESOPHAGEAL ECHOCARDIOGRAM (TEE);   Surgeon: [name], MD;  Location: [facility] ENDOSCOPY;  Service: Cardiovascular;  Laterality: N/A;     reports that he quit smoking about 12 years ago. He has never used smokeless tobacco. He reports that he does not drink alcohol or use drugs.   Family History  Problem Relation Age of Onset  . Cancer Mother        lung  . Diabetes Mother   . Heart attack Father   . Diabetes Father   . Diabetes Sister     Physical Exam:   Vitals:   06/07/19 1215 06/07/19 1230 06/07/19 1245 06/07/19 1300  BP: 105/87 129/70  120/83  Pulse: 96 90 95 90  Resp: (!) 25 (!) 34 11 18  SpO2:   100%   Weight:      Height:       Constitutional: NAD, calm, Eyes: PERRL, lids and conjunctivae normal ENMT: Mucous membranes are moist. Posterior pharynx clear of any exudate or lesions.Normal dentition.  Neck: normal, supple, no masses, no thyromegaly Respiratory:  clear to auscultation bilaterally, no wheezing, no crackles. Normal respiratory effort. No accessory muscle use.  Cardiovascular: Regular rate and rhythm, no murmurs / rubs / gallops. No extremity edema. 2+ pedal pulses. No carotid bruits.  Abdomen: no tenderness, no masses palpated. No hepatosplenomegaly. Bowel sounds positive.  Musculoskeletal: no clubbing / cyanosis. No joint deformity upper and lower extremities. Good ROM, no contractures. Normal muscle tone.  rBKA Neurologic: CN II-XII grossly intact. Sensation intact, DTR normal. Strength 5/5 in all 4.  Psychiatric: Normal judgment and insight. Alert and oriented x 3. Normal mood.  Skin: no rashes, lesions, ulcers. No induration Decubitus/ulcers: None visible Urinary catheter: none  Wounds: None visible          Labs on admission:    I have personally reviewed following labs and imaging studies  CBC: Recent Labs  Lab 06/07/19 1126  WBC 15.0*  NEUTROABS 13.2*  HGB 12.0*  HCT 36.2*  MCV 107.7*  PLT 354   Basic Metabolic Panel: Recent Labs  Lab 06/07/19 1125  NA 138  K  6.8*  CL 99  CO2 17*  GLUCOSE 321*  BUN 90*  CREATININE 12.27*  CALCIUM 7.3*   GFR: Estimated Creatinine Clearance: 8.9 mL/min (A) (by C-G formula based on SCr of 12.27 mg/dL (H)). Liver Function Tests: Recent Labs  Lab 06/07/19 1125  AST 53*  ALT 39  ALKPHOS 93  BILITOT 1.1  PROT 6.7  ALBUMIN 3.6   Recent Labs  Lab 06/07/19 1125  LIPASE 33   No results for input(s): AMMONIA in the last 168 hours. Coagulation Profile: Recent Labs  Lab 06/07/19 1125  INR 1.4*  CBG: Recent Labs  Lab 06/07/19 0950  GLUCAP 151*    Radiologic Exams on Admission:   DG Chest Port 1 View  Result Date: 06/07/2019 CLINICAL DATA:  Weakness and shortness of breath. EXAM: PORTABLE CHEST 1 VIEW COMPARISON:  05/16/2019 and prior radiographs FINDINGS: Cardiomegaly, pulmonary vascular congestion and mild interstitial pulmonary edema noted. No focal airspace disease, consolidation, mass, large pleural effusion or pneumothorax noted. A defibrillator/pacing pad overlying the LEFT chest is noted. No acute bony abnormalities identified. IMPRESSION: Cardiomegaly with pulmonary vascular congestion and mild interstitial pulmonary edema. Electronically Signed   By: Margarette Canada M.D.   On: 06/07/2019 10:10    EKG:   Independently reviewed.  Orders placed or performed during the hospital encounter of 06/07/19  . ED EKG  . ED EKG  . EKG 12-Lead  . EKG 12-Lead  . EKG 12-Lead  . EKG 12-Lead  . EKG 12-Lead  . EKG 12-Lead  . EKG 12-Lead   ------------------------------------------------------------------------------------------------------------------------------------    Assessment / Plan:   Principal Problem:   Cardiac arrest (HCC) Active Problems:   Hyperkalemia   ESRD on hemodialysis (HCC)   DM2 (diabetes mellitus, type 2) (HCC)   HLD (hyperlipidemia)   S/P below knee amputation, right (HCC)   Anemia due to end stage renal disease (HCC)   Weakness   CHF (congestive heart failure)  (HCC)   Atrial flutter (HCC)   Principal Problem:    Cardiac arrest ([place]) x2 [place]sted -bradycardia -Status post CPR, received calcium and epinephrine -Pulse was achieved, remained bradycardic -Admitting to stepdown unit -Likely due to missed hemodialysis, electrolyte normality, Hyperkalemia " Potassium >8.5" ..on HD  -Patient has been a stabilized, currently on hemodialysis -External pacing was applied, being weaned off. -Cardiology and nephrology consulted -Cardiology following closely appreciate  -We'll monitor patient closely monitored bed -We'll follow cardiology recommendation currently troponin  elevated 93 Patient has been turned off on dopamine drip.   Mild/moderate respiratory distress Post cardiac arrest -Likely due to volume overload -Currently satting 96% on 3 L of oxygen -Anticipating improvement with hemodialysis -Influenza A/B, SARS-CoV-2 negative.  Hyperkalemia -Potassium 6.8, -On hemodialysis, -Monitoring closely, nephrology following    ESRD on hemodialysis ([place]) -Missed hemodialysis, volume overload, with hyperkalemia -Nephrology consulted, currently on hemodialysis. -Normal hemodialysis days Mondays Wednesdays Fridays -Appreciate nephrology following.   Hypotension post cardiac arrest, -Currently on dopamine drip titrating down. Currently on 1 mcg per minute.  Blood pressure improving.   Leukocytosis -No signs of infection -Likely reactive -Monitoring closely, no antibiotics at this time.    Active Problems:  Chronic systolic congestive heart failure with ejection fraction of 25-30% -Last echo 10/28/2018 reviewed ejection fraction of 25--30%, normal RV function, -We'll monitor daily weight, I's and O's -On hemodialysis anticipating fluid reduction -Cardiology following home medication does not indicate beta-blockers  H/o atrial flutter [facility]) with DCCV January 2021 -Continue home medication of amiodarone and Eliquis -History of  noncompliance.  DM2 (diabetes mellitus, type 2) ([place]) -N.p.o. for now--till he recovers -Checking CBG every 4 hours and QA CHS with SSI coverage    HLD (hyperlipidemia) -Once tolerating p.o. 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   Noncompliant -Questionable history of depression, multiple missed hemodialysis, multiple hospitalization -near death experiences. -Once stable will consult psych for further evaluation    Cultures:  -none  Antimicrobial: -none   Consults called:  Cardiology/nephrology ---------------------------------------------------------------------------------------------------------------------------------------- DVT prophylaxis: SCD/Compression stockings/home medication of Eliquis Code Status:   Code Status: Full Code   Admission status: Patient will be admitted as Inpatient, with a greater than 2 midnight length of stay.   Family Communication:  none at bedside  (The above findings and plan of care has been discussed with patient in detail, the patient expressed understanding and agreement of above plan)  ----------------------------------------------------------------------------------------------------------------------------------------  Disposition Plan: >3 days Status is: Inpatient  Remains inpatient appropriate because:Hemodynamically unstable   Dispo: The patient is from: Home              Anticipated d/c is to: Home              Anticipated d/c date is: 3 days              Patient currently is not medically stable to d/c. Status post cardiac arrest, asystole x2, volume overload, hyperkalemia, needing close monitoring, emergent  hemodialysis    ------------------------------------------------------------------------------------------------------------------------------------  Time spent: > than  66  Min.   SIGNED: Deatra Ebin, MD, FACP, FHM. [facility],  Pager (Please use [email or web address] to page to text)  If 7PM-7AM, please contact night-coverage [email or web address] Ota [facility] 06/07/2019, 1:37 PM

## 2019-06-07 NOTE — ED Triage Notes (Signed)
Per EMS, called out in reference to hypoglycemia, hyperkalemia. Pt missed dialysis treatment on Wednesday. Pt alert and talking to ems when they arrived; pt was drowsy en route and about 5 mins out pt went unresponsive and asystole was noted; no pulse was palpated and no respirations noted; ems stopped and started cpr at 0927; cpr lasted approximately 30 secs and pt became alert for a few seconds until he became unresponsive again and cpr was started; an IO was started in left leg and calcium was given; pt then started to become alert and pt is currently alert upon arrival to ED; pt's last dialysis treatment was Monday. Pt told ems he did not go to dialysis today because he was too weak to make it to the bus.

## 2019-06-07 NOTE — ED Notes (Signed)
Dialysis RN at bedside preparing for emergent dialysis.

## 2019-06-07 NOTE — Progress Notes (Signed)
CRITICAL VALUE ALERT  Critical Value:  K 7.0  Date & Time Notied:  06/07/19 2028  Provider Notified: Darrick Meigs and spoke with dialysis nurse  Orders Received/Actions taken: dialysis ordered

## 2019-06-07 NOTE — Progress Notes (Signed)
Around 1900, patient's HR noted to be bradycardic in the 30s. Patient completely alert and oriented with no complaints. Hospitalist called to bedside. In the meantime, [facility]Link RN called and instructed this RN to administer atropine and restart dopamine gtt (see MAR for details). Patient temporarily paced. Once hospitalist arrived at bedside, MD updated on patient status. MD ordered 1amp of atropine (given at 1916), 1amp of bicarb (given at 1917), 1amp of D50 and 10u of insulin (given at [place]). Stat BMET and Troponin ordered. Patient maintaining HR, pacing stopped at this time. Awaiting lab results and further MD instructions. Report given to night shift RN.  Celestia Khat, RN

## 2019-06-07 NOTE — ED Notes (Signed)
1130- Non rebreather at 11 lpm O2 @100 % , attempt titrate oxygen per EDP 1137: Don't give the rest of the blood per EDP labs have returned. Cardiologist in to attempt to stop pacing. Cardiologist said to leave it on pulse is still weak.   1152: Intraosseous removed form left knee by EDP 155 Titrate to [place] at 7 lpm 100%.

## 2019-06-07 NOTE — ED Notes (Signed)
Oxygen 100% at 3 lpm.  Dopamine 3 mcg 1239 - 128/72 Map 87

## 2019-06-07 NOTE — ED Notes (Signed)
Pt alert at this time. Skin cool to touch. Dialysis in progress. Pt reports chest pain 8/10.

## 2019-06-07 NOTE — ED Notes (Signed)
Date and time results received: 06/07/19  1207   Test: Potassium Critical Value: 6.8  Name of Provider Notified: Dr. Melina Copa  Orders Received? Or Actions Taken?: No new orders given at this time. Labs were collected prior to dialysis.

## 2019-06-07 NOTE — ED Notes (Addendum)
EDP and Nephrology MD at bedside. 1032- pacing initiated 1035- lost pulse. CPR started. 1036-1 amp epi administered per verbal Dr. Melina Copa, code cart opened. 1037-RSI kit pulled from pyxis, RT preparing 7.5 tube for intubation 1038-pulses returned 1040-lost pulses resumed CPR. 1042-pulses returned 1043-stopped emergency release blood Per verbal from nephrology MD 1047-pacing setting changed, out put 73ma, rate of 80

## 2019-06-07 NOTE — Progress Notes (Signed)
Spoke with nurse that previously performed the dialysis on the patient. She insists that our labs are not accurate, questioned where the draw was from and if they had hemolysis. Contacted the lab who verified there was no hemolysis. Nurse advised that she could attempt to draw labs off the fistula. This RN advised her that if she would like to come and draw then she could to attempt to confirm or disprove the results. Also spoke with Rockwell Alexandria, dialysis, who advised she was on the way to do another dialysis treatment for the patient.

## 2019-06-07 NOTE — Consult Note (Signed)
Cardiology Consultation:   Patient ID: Brendan Campbell MRN: [id number]; DOB: 05/29/1970  Admit date: 06/07/2019 Date of Consult: 06/07/2019  Primary Care Provider: Monico Blitz, MD Primary Cardiologist: Dr Bronson Ing Primary Electrophysiologist:  None    Patient Profile:   Brendan Campbell is a 49 y.o. male with a hx of ESRD, aflutter who is being seen today for the evaluation of asystole/brady arrest at the request of Dr Melina Copa.  History of Present Illness:   Brendan Campbell 49 yo male history of aflutter with DCCV Jan 2021, noncompliance, ESRD, chronic systolic HF LVE 97-67% admitted after being unresponsive in the field. Missed HD on Wednesday. Per notes nonresponsive at EMS evaluation, found to be in asystole. Short duration of CPR, external pacing in ER currently. Found to be severely hyperkalemic, having emergeny HD in the ER  From notes multiple admissions for hyperkalemia and pulmonary edema after missing HD.   istat labs K >8.5   10/2018 echo LVE 25-30%, normal RV function, mod LAE     Past Medical History:  Diagnosis Date  . Anemia   . Anxiety   . Blood transfusion without reported diagnosis   . CHF (congestive heart failure) ([place])   . Chronic kidney disease   . Depression   . Diabetes mellitus without complication ([place])   . End stage renal disease ([place])    M/W/F [facility]  . Hyperlipidemia   . Neuropathy   . Peripheral vascular disease ([place])   . PTSD (post-traumatic stress disorder)     Past Surgical History:  Procedure Laterality Date  . A/V FISTULAGRAM N/A 10/09/2018   Procedure: A/V FISTULAGRAM;  Surgeon: Serafina Mitchell, MD;  Location: [place] [facility];  Service: Cardiovascular;  Laterality: N/A;  . AV FISTULA PLACEMENT Left 09/22/2016   Procedure: CREATION OF LEFT ARM ARTERIOVENOUS (AV) FISTULA;  Surgeon: Waynetta Sandy, MD;  Location: [facility];  Service: Vascular;  Laterality: Left;  . AV FISTULA PLACEMENT Left 10/31/2017   Procedure: INSERTION OF  ARTERIOVENOUS (AV) GORE-TEX 4-43mm STETCH GRAFT LEFT ARM;  Surgeon: Waynetta Sandy, MD;  Location: [facility];  Service: Vascular;  Laterality: Left;  . BASCILIC VEIN TRANSPOSITION Left 08/17/2017   Procedure: SECOND STAGE BASILIC VEIN TRANSPOSITION LEFT ARM;  Surgeon: Waynetta Sandy, MD;  Location: [facility];  Service: Vascular;  Laterality: Left;  . BELOW KNEE LEG AMPUTATION Right   . CARDIOVERSION N/A 02/19/2019   Procedure: CARDIOVERSION;  Surgeon: [name], MD;  Location: [facility] ENDOSCOPY;  Service: Cardiovascular;  Laterality: N/A;  . CHOLECYSTECTOMY    . FOOT SURGERY    . IR FLUORO GUIDE CV LINE RIGHT  05/16/2016  . IR FLUORO GUIDE CV LINE RIGHT  02/16/2019  . IR REMOVAL TUN CV CATH W/O FL  05/16/2016  . IR REMOVAL TUN CV CATH W/O FL  02/19/2019  . IR THROMBECTOMY AV FISTULA W/THROMBOLYSIS/PTA INC/SHUNT/IMG LEFT Left 11/09/2018  . IR US GUIDE VASC ACCESS LEFT  11/09/2018  . IR US GUIDE VASC ACCESS RIGHT  05/16/2016  . IR US GUIDE VASC ACCESS RIGHT  02/16/2019  . PERIPHERAL VASCULAR BALLOON ANGIOPLASTY  10/09/2018   Procedure: PERIPHERAL VASCULAR BALLOON ANGIOPLASTY;  Surgeon: Serafina Mitchell, MD;  Location: [place];  Service: Cardiovascular;;  . TEE WITHOUT CARDIOVERSION N/A 02/19/2019   Procedure: TRANSESOPHAGEAL ECHOCARDIOGRAM (TEE);  Surgeon: [name], MD;  Location: [facility] ENDOSCOPY;  Service: Cardiovascular;  Laterality: N/A;     Inpatient Medications: Scheduled Meds: . Chlorhexidine Gluconate Cloth  6 each Topical Q0600   Continuous Infusions: . sodium chloride    . DOPamine 5 mcg/kg/min (06/07/19 1046)   PRN Meds:   Allergies:    Allergies  Allergen Reactions  . Tape Other (See Comments)    Pulls skin off    Social History:   Social History   Socioeconomic History  . Marital status: Single    Spouse name: Not on file  . Number of children: Not on file  . Years of education: Not on file  . Highest education level: Not on file  Occupational  History  . Not on file  Tobacco Use  . Smoking status: Former Smoker    Quit date: 09/03/2006    Years since quitting: 12.7  . Smokeless tobacco: Never Used  Substance and Sexual Activity  . Alcohol use: No  . Drug use: No  . Sexual activity: Not on file  Other Topics Concern  . Not on file  Social History Narrative  . Not on file   Social Determinants of Health   Financial Resource Strain:   . Difficulty of Paying Living Expenses:   Food Insecurity:   . Worried About Charity fundraiser in the Last Year:   . Arboriculturist in the Last Year:   Transportation Needs:   . Film/video editor (Medical):   [name] Lack of Transportation (Non-Medical):   Physical Activity:   . Days of Exercise per Week:   . Minutes of Exercise per Session:   Stress:   . Feeling of Stress :   Social Connections:   . Frequency of Communication with Friends and Family:   . Frequency of Social Gatherings with Friends and Family:   . Attends Religious Services:   . Active Member of Clubs or Organizations:   . Attends Archivist Meetings:   [name] Marital Status:   Intimate Partner Violence:   . Fear of Current or Ex-Partner:   . Emotionally Abused:   [name] Physically Abused:   . Sexually Abused:     Family History:    Family History  Problem Relation Age of Onset  . Cancer Mother        lung  . Diabetes Mother   . Heart attack Father   . Diabetes Father   . Diabetes Sister      ROS:  Please see the history of present illness.  All other ROS reviewed and negative.     Physical Exam/Data:   Vitals:   06/07/19 1052 06/07/19 1056 06/07/19 1100 06/07/19 1115  BP:   (!) 173/94 136/79  Pulse: 89 98    Resp: (!) 21 (!) 21 (!) 22 16  SpO2: 99% 100%    Weight:      Height:       No intake or output data in the 24 hours ending 06/07/19 1122 Last 3 Weights 06/07/2019 05/16/2019 05/16/2019  Weight (lbs) 216 lb 14.9 oz 216 lb 14.9 oz 217 lb  Weight (kg) 98.4 kg 98.4 kg 98.431 kg     Body  mass index is 29.42 kg/m.  General:  Well nourished, well developed, in no acute distress HEENT: normal Lymph: no adenopathy Neck: no JVD Endocrine:  No thryomegaly Cardiac:  normal S1, S2; RRR; no murmur Lungs:  clear to auscultation bilaterally, no wheezing, rhonchi or rales  Abd: soft, nontender, no hepatomegaly  Ext: no edema Musculoskeletal:  No deformities, BUE and BLE strength normal and equal Skin: warm and dry  Neuro:  CNs 2-12 intact, no focal abnormalities noted Psych:  Normal affect     Laboratory Data:  High Sensitivity Troponin:   Recent Labs  Lab 05/16/19 0814 05/16/19 1023  TROPONINIHS 41* 43*     ChemistryNo results for input(s): NA, K, CL, CO2, GLUCOSE, BUN, CREATININE, CALCIUM, GFRNONAA, GFRAA, ANIONGAP in the last 168 hours.  No results for input(s): PROT, ALBUMIN, AST, ALT, ALKPHOS, BILITOT in the last 168 hours. HematologyNo results for input(s): WBC, RBC, HGB, HCT, MCV, MCH, MCHC, RDW, PLT in the last 168 hours. BNPNo results for input(s): BNP, PROBNP in the last 168 hours.  DDimer No results for input(s): DDIMER in the last 168 hours.   Radiology/Studies:  DG Chest Port 1 View  Result Date: 06/07/2019 CLINICAL DATA:  Weakness and shortness of breath. EXAM: PORTABLE CHEST 1 VIEW COMPARISON:  05/16/2019 and prior radiographs FINDINGS: Cardiomegaly, pulmonary vascular congestion and mild interstitial pulmonary edema noted. No focal airspace disease, consolidation, mass, large pleural effusion or pneumothorax noted. A defibrillator/pacing pad overlying the LEFT chest is noted. No acute bony abnormalities identified. IMPRESSION: Cardiomegaly with pulmonary vascular congestion and mild interstitial pulmonary edema. Electronically Signed   By: Margarette Canada M.D.   On: 06/07/2019 10:10     Assessment and Plan:   1. Asystole arrest/bradycardia - K >8.5 on istat labs due to missing his HD on Wed causing his heart to stop - external pacing currently, getting  emergent HD - as K normalizes his conduction should return back to his baseline - wean external pacing as tolerated, will check back to see how rates are doign   2. History of aflutter - continue home regimen of amio and eliquis   3. Chronic systolic HF - volume management per HD - he has been noncompliant with cardiology outpatient visits, if establishes can consider additional therapies and potential ischemic testing.       For questions or updates, please contact [place] Please consult [email or web address] for contact info under     Signed, Carlyle Dolly, MD  06/07/2019 11:22 AM

## 2019-06-07 NOTE — ED Notes (Signed)
Dr. Ree Kida of abnormal I-stat Chem 8 results

## 2019-06-07 NOTE — Progress Notes (Signed)
Called by RN that patient went into bradycardia with heart rate in 30s, by the time I arrived patient was already on dopamine gtt. and transcutaneous pacer.  Patient potassium after dialysis was elevated at 7.4.  Ordered 1 amp of bicarb, 10 units of IV insulin, 1 amp of D50.  Patient's heart rate was stable maintaining sinus rhythm.  EKG showed junctional rhythm with tall T waves. ] Ordered stat bmp, results came back elevated potassium at 7.0. Nephrology consulted.  Plan for hemodialysis tonight. We will continue to monitor.  Critical care time spent 35 minutes

## 2019-06-07 NOTE — Procedures (Signed)
     EMERGENT HEMODIALYSIS TREATMENT NOTE:  Temporizing measures corrected K to 6.8 just prior to emergent 3-hour HD session in ED this morning.  This evening, pt remains hyperkalemic, K 7.1 confirmed by second lab draw, necessitating further dialysis.  Pre-HD pt was alert in bed, c/o chest wall and rib pain presumably from earlier resuscitative efforts.  "[name]."  SR 90s with frequent PVCs and two episodes of non-sustained VT. I have had numerous discussions with patient on prior admissions about the risks of cardiac arrest from hyperkalemia and the direct correlation between missing HD on Wednesdays only to be hospitalized on Fridays.  While I attempted for further emphasize this point this evening,  patient shook his head and stated, "[name], [name]."    4 hour treatment completed.  Appreciate assistance of Linton Flemings, RN with difficult cannulation of AVG.  1.7 L removed with limited (44m) interruption in ultrafiltration.  All blood was returned and hemostasis was achieved in 15 minutes. In NSR with no ectopy.  Before departing, I asked Brendan Campbell if there was anything I could do for him besides saving his life.  He gave a slight salute and said, "[name]."    Brendan Alexandria, RN

## 2019-06-07 NOTE — ED Notes (Signed)
Pulses palpated

## 2019-06-07 NOTE — ED Notes (Signed)
Dopamine 1 mcg titrate 129/83 map 96 Attempted titrate oxygen to 2 lpm oxygen began dropping. Returned oxygen to 3 lpm.Oxygen back at 96%

## 2019-06-07 NOTE — ED Notes (Signed)
No pulses palpated, CPR started

## 2019-06-07 NOTE — ED Notes (Signed)
One amp of epinephrine given

## 2019-06-07 NOTE — ED Notes (Signed)
Pace turned off by EDP. Titrate Dopamine.

## 2019-06-07 NOTE — Consult Note (Signed)
[facility] Renal Consultation Note    Indication for Consultation:  Management of ESRD/hemodialysis; anemia, hypertension/volume and secondary hyperparathyroidism  HPI: Brendan Campbell is a 49 y.o. male with a PMH significant for DM, HTN, PTSD, HLD, CHF, noncompliance, PVD s/p RBKA, and ESRD on HD every MWF who presented to [facility] ED with worsening shortness of breath and not feeling well.  He chronically skips his Wednesday dialysis despite multiple admissions for pulmonary edema/volume overload and hyperkalemia and presented today with the same issues.  EMS was called due to AMS and en route became unresponsive with asystole.  EMS started CPR and pt became alert after 30 secs of CPR but then became unresponsive again.  CPR was restarted and an IO was started in left leg and calcium was given and he became more alert.  In the ED, he was lethargic but arousable.  I-stat K was >6.5 and he telemetry revealed severe bradycardia with HR in the 30's.  External pacing was initiated.  His mental status continued to wax and wane.   We were consulted to provide urgent HD.  Insulin and D50 were given but pt became unresponsive and pulseless.  CPR was started at 10:35, 1 amp of epi given with return of pulses.  Started on dopamine drip and HD nurse notified to stop HD on another pt in the ER and get a new machine for emergent HD.   Past Medical History:  Diagnosis Date  . Anemia   . Anxiety   . Blood transfusion without reported diagnosis   . CHF (congestive heart failure) ([place])   . Chronic kidney disease   . Depression   . Diabetes mellitus without complication ([place])   . End stage renal disease ([place])    M/W/F [facility]  . Hyperlipidemia   . Neuropathy   . Peripheral vascular disease ([place])   . PTSD (post-traumatic stress disorder)    Past Surgical History:  Procedure Laterality Date  . A/V FISTULAGRAM N/A 10/09/2018   Procedure: A/V FISTULAGRAM;  Surgeon: Serafina Mitchell, MD;  Location:  [facility];  Service: Cardiovascular;  Laterality: N/A;  . AV FISTULA PLACEMENT Left 09/22/2016   Procedure: CREATION OF LEFT ARM ARTERIOVENOUS (AV) FISTULA;  Surgeon: Waynetta Sandy, MD;  Location: [facility];  Service: Vascular;  Laterality: Left;  . AV FISTULA PLACEMENT Left 10/31/2017   Procedure: INSERTION OF ARTERIOVENOUS (AV) GORE-TEX 4-75mm STETCH GRAFT LEFT ARM;  Surgeon: Waynetta Sandy, MD;  Location: [place];  Service: Vascular;  Laterality: Left;  . BASCILIC VEIN TRANSPOSITION Left 08/17/2017   Procedure: SECOND STAGE BASILIC VEIN TRANSPOSITION LEFT ARM;  Surgeon: Waynetta Sandy, MD;  Location: [place];  Service: Vascular;  Laterality: Left;  . BELOW KNEE LEG AMPUTATION Right   . CARDIOVERSION N/A 02/19/2019   Procedure: CARDIOVERSION;  Surgeon: [name], MD;  Location: [facility] ENDOSCOPY;  Service: Cardiovascular;  Laterality: N/A;  . CHOLECYSTECTOMY    . FOOT SURGERY    . IR FLUORO GUIDE CV LINE RIGHT  05/16/2016  . IR FLUORO GUIDE CV LINE RIGHT  02/16/2019  . IR REMOVAL TUN CV CATH W/O FL  05/16/2016  . IR REMOVAL TUN CV CATH W/O FL  02/19/2019  . IR THROMBECTOMY AV FISTULA W/THROMBOLYSIS/PTA INC/SHUNT/IMG LEFT Left 11/09/2018  . IR US GUIDE VASC ACCESS LEFT  11/09/2018  . IR US GUIDE VASC ACCESS RIGHT  05/16/2016  . IR US GUIDE VASC ACCESS RIGHT  02/16/2019  . PERIPHERAL VASCULAR BALLOON  ANGIOPLASTY  10/09/2018   Procedure: PERIPHERAL VASCULAR BALLOON ANGIOPLASTY;  Surgeon: Serafina Mitchell, MD;  Location: [place] CV LAB;  Service: Cardiovascular;;  . TEE WITHOUT CARDIOVERSION N/A 02/19/2019   Procedure: TRANSESOPHAGEAL ECHOCARDIOGRAM (TEE);  Surgeon: [name], MD;  Location: [facility] ENDOSCOPY;  Service: Cardiovascular;  Laterality: N/A;   Family History:   Family History  Problem Relation Age of Onset  . Cancer Mother        lung  . Diabetes Mother   . Heart attack Father   . Diabetes Father   . Diabetes Sister    Social History:  reports that  he quit smoking about 12 years ago. He has never used smokeless tobacco. He reports that he does not drink alcohol or use drugs. Allergies  Allergen Reactions  . Tape Other (See Comments)    Pulls skin off   Prior to Admission medications   Medication Sig Start Date End Date Taking? Authorizing Provider  amiodarone (PACERONE) 200 MG tablet Take 1 tablet (200 mg total) by mouth daily. 02/21/19   Donnamae Jude, MD  apixaban (ELIQUIS) 5 MG TABS tablet Take 1 tablet (5 mg total) by mouth 2 (two) times daily. 02/20/19   Donnamae Jude, MD  aspirin 81 MG chewable tablet Chew 81 mg by mouth daily.    [provider]  atorvastatin (LIPITOR) 10 MG tablet Take 1 tablet (10 mg total) by mouth every evening. 02/20/19   Donnamae Jude, MD  calcium acetate (PHOSLO) 667 MG capsule Take 1 capsule (667 mg total) by mouth 3 (three) times daily with meals. 02/21/19   Donnamae Jude, MD  LANTUS SOLOSTAR 100 UNIT/ML Solostar Pen Inject 10 Units into the skin at bedtime.  01/18/19   [provider]  lidocaine-prilocaine (EMLA) cream Apply 1 application topically See admin instructions. 05/06/19   [provider]  multivitamin (RENA-VIT) TABS tablet Take 1 tablet by mouth daily. 08/04/18   [provider]  tiZANidine (ZANAFLEX) 4 MG tablet Take 4 mg by mouth daily as needed. 04/07/19   [provider]   Current Facility-Administered Medications  Medication Dose Route Frequency Provider Last Rate Last Admin  . 0.9 %  sodium chloride infusion  10 mL/hr Intravenous Once Hayden Rasmussen, MD      . Chlorhexidine Gluconate Cloth 2 % PADS 6 each  6 each Topical Q0600 Donato Heinz, MD      . DOPamine (INTROPIN) 800 mg in dextrose 5 % 250 mL (3.2 mg/mL) infusion  0-20 mcg/kg/min Intravenous Continuous Hayden Rasmussen, MD 9.23 mL/hr at 06/07/19 1046 5 mcg/kg/min at 06/07/19 1046  . lidocaine (PF) (XYLOCAINE) 1 % injection 5 mL  5 mL Intradermal Once Hayden Rasmussen, MD        Current Outpatient Medications  Medication Sig Dispense Refill  . amiodarone (PACERONE) 200 MG tablet Take 1 tablet (200 mg total) by mouth daily. 90 tablet 3  . apixaban (ELIQUIS) 5 MG TABS tablet Take 1 tablet (5 mg total) by mouth 2 (two) times daily. 180 tablet 3  . aspirin 81 MG chewable tablet Chew 81 mg by mouth daily.    [name] atorvastatin (LIPITOR) 10 MG tablet Take 1 tablet (10 mg total) by mouth every evening. 90 tablet 3  . calcium acetate (PHOSLO) 667 MG capsule Take 1 capsule (667 mg total) by mouth 3 (three) times daily with meals. 90 capsule 2  . LANTUS SOLOSTAR 100 UNIT/ML Solostar Pen Inject 10 Units into the  skin at bedtime.     . lidocaine-prilocaine (EMLA) cream Apply 1 application topically See admin instructions.    . multivitamin (RENA-VIT) TABS tablet Take 1 tablet by mouth daily.    [name] tiZANidine (ZANAFLEX) 4 MG tablet Take 4 mg by mouth daily as needed.     Labs: Basic Metabolic Panel: No results for input(s): NA, K, CL, CO2, GLUCOSE, BUN, CREATININE, CALCIUM, PHOS in the last 168 hours.  Invalid input(s): ALB Liver Function Tests: No results for input(s): AST, ALT, ALKPHOS, BILITOT, PROT, ALBUMIN in the last 168 hours. No results for input(s): LIPASE, AMYLASE in the last 168 hours. No results for input(s): AMMONIA in the last 168 hours. CBC: Recent Labs  Lab 06/07/19 1126  WBC 15.0*  NEUTROABS 13.2*  HGB 12.0*  HCT 36.2*  MCV 107.7*  PLT 265   Cardiac Enzymes: No results for input(s): CKTOTAL, CKMB, CKMBINDEX, TROPONINI in the last 168 hours. CBG: Recent Labs  Lab 06/07/19 0950  GLUCAP 151*   Iron Studies: No results for input(s): IRON, TIBC, TRANSFERRIN, FERRITIN in the last 72 hours. Studies/Results: DG Chest Port 1 View  Result Date: 06/07/2019 CLINICAL DATA:  Weakness and shortness of breath. EXAM: PORTABLE CHEST 1 VIEW COMPARISON:  05/16/2019 and prior radiographs FINDINGS: Cardiomegaly, pulmonary vascular congestion and mild interstitial  pulmonary edema noted. No focal airspace disease, consolidation, mass, large pleural effusion or pneumothorax noted. A defibrillator/pacing pad overlying the LEFT chest is noted. No acute bony abnormalities identified. IMPRESSION: Cardiomegaly with pulmonary vascular congestion and mild interstitial pulmonary edema. Electronically Signed   By: Margarette Canada M.D.   On: 06/07/2019 10:10    ROS: Review of systems not obtained due to patient factors. Physical Exam: Vitals:   06/07/19 1052 06/07/19 1056 06/07/19 1100 06/07/19 1115  BP:   (!) 173/94 136/79  Pulse: 89 98    Resp: (!) 21 (!) 21 (!) 22 16  SpO2: 99% 100%    Weight:      Height:          Weight change:  No intake or output data in the 24 hours ending 06/07/19 1149 BP 136/79   Pulse 98   Resp 16   Ht 6' (1.829 m)   Wt 98.4 kg   SpO2 100%   BMI 29.42 kg/m  General appearance: delirious and toxic Head: Normocephalic, without obvious abnormality, atraumatic Resp: clear to auscultation bilaterally Cardio: bradycardic and irregular GI: soft, non-tender; bowel sounds normal; no masses,  no organomegaly Extremities: s/p RBKA, left IO IV in place, no edema, LUE AVG +T/B Dialysis Access:  [facility] 4h 36min 87.5kg 1K/2.25 bath Hep 1000+ 500u/hr LUE AVG 300/600  - prosthetic weighs 1.6 kg - Hectoral 0.53mcg IV/HD  - Epogen 600Units IV/HD  Assessment/Plan: 1.  Cardiac arrest/bradycardia- presumably due to hyperkalemia.  Responded to CPR.  Currently on dopamine and treated with IV calcium, insulin/D50, and bicarb.  Plan for emergent HD with low K bath.  Will need to follow on telemetry or ICU after HD and agree with Cardiology evaluation.  Wean dopamine as able.  2.  ESRD -  As above, chronically noncompliant 1. Plan for emergent HD with low K bath. 3.  Hypertension/volume  - UF with HD as tolerated 4.  Anemia  - Will transfuse with HD 5.  Metabolic bone disease -  Resume outpatient meds when  able to take po 6.  Nutrition -  Renal diet 7. Noncompliance - pt with near death again.  He  has a history of depression and will need to be evaluated by Psychiatry to make sure this is not related to passive suicide attempts or just bad judgement/poor health literacy/insight.   Donetta Potts, MD [place] Pager [phone] 06/07/2019, 11:49 AM

## 2019-06-08 DIAGNOSIS — D631 Anemia in chronic kidney disease: Secondary | ICD-10-CM

## 2019-06-08 DIAGNOSIS — L97509 Non-pressure chronic ulcer of other part of unspecified foot with unspecified severity: Secondary | ICD-10-CM

## 2019-06-08 DIAGNOSIS — Z992 Dependence on renal dialysis: Secondary | ICD-10-CM

## 2019-06-08 DIAGNOSIS — Z794 Long term (current) use of insulin: Secondary | ICD-10-CM

## 2019-06-08 DIAGNOSIS — I509 Heart failure, unspecified: Secondary | ICD-10-CM

## 2019-06-08 DIAGNOSIS — E11621 Type 2 diabetes mellitus with foot ulcer: Secondary | ICD-10-CM

## 2019-06-08 DIAGNOSIS — I4892 Unspecified atrial flutter: Secondary | ICD-10-CM

## 2019-06-08 DIAGNOSIS — N186 End stage renal disease: Secondary | ICD-10-CM

## 2019-06-08 LAB — TYPE AND SCREEN
ABO/RH(D): O POS
Antibody Screen: NEGATIVE
Unit division: 0

## 2019-06-08 LAB — RENAL FUNCTION PANEL
Albumin: 4 g/dL (ref 3.5–5.0)
Anion gap: 16 — ABNORMAL HIGH (ref 5–15)
BUN: 22 mg/dL — ABNORMAL HIGH (ref 6–20)
CO2: 30 mmol/L (ref 22–32)
Calcium: 8.5 mg/dL — ABNORMAL LOW (ref 8.9–10.3)
Chloride: 88 mmol/L — ABNORMAL LOW (ref 98–111)
Creatinine, Ser: 4.66 mg/dL — ABNORMAL HIGH (ref 0.61–1.24)
GFR calc Af Amer: 16 mL/min — ABNORMAL LOW (ref >60)
GFR calc non Af Amer: 14 mL/min — ABNORMAL LOW (ref >60)
Glucose, Bld: 87 mg/dL (ref 70–99)
Phosphorus: 5.7 mg/dL — ABNORMAL HIGH (ref 2.5–4.6)
Potassium: 4.5 mmol/L (ref 3.5–5.1)
Sodium: 134 mmol/L — ABNORMAL LOW (ref 135–145)

## 2019-06-08 LAB — MRSA PCR SCREENING: MRSA by PCR: NEGATIVE

## 2019-06-08 LAB — BPAM RBC
Blood Product Expiration Date: 202105212359
ISSUE DATE / TIME: 202104301011
Unit Type and Rh: 9500

## 2019-06-08 LAB — GLUCOSE, CAPILLARY
Glucose-Capillary: 105 mg/dL — ABNORMAL HIGH (ref 70–99)
Glucose-Capillary: 71 mg/dL (ref 70–99)
Glucose-Capillary: 76 mg/dL (ref 70–99)

## 2019-06-08 LAB — PROTIME-INR
INR: 1.3 — ABNORMAL HIGH (ref 0.8–1.2)
Prothrombin Time: 15.2 seconds (ref 11.4–15.2)

## 2019-06-08 LAB — APTT: aPTT: 34 seconds (ref 24–36)

## 2019-06-08 MED ORDER — DIPHENHYDRAMINE HCL 25 MG PO CAPS: 25.0000 mg | ORAL_CAPSULE | Freq: Four times a day (QID) | ORAL | Status: DC | PRN

## 2019-06-08 MED ORDER — INSULIN ASPART 100 UNIT/ML ~~LOC~~ SOLN: 0.0000 [IU] | Freq: Three times a day (TID) | SUBCUTANEOUS | Status: DC

## 2019-06-08 NOTE — Progress Notes (Signed)
PROGRESS NOTE    Brendan Campbell  KGM:[id number] DOB: [birth year] DOA: 06/07/2019 PCP: Monico Blitz, MD    Brief Narrative:  49 year old male with a history of end-stage renal disease on hemodialysis, chronic systolic heart failure, atrial flutter, peripheral vascular disease status post BKA, admitted to the hospital after he developed severe bradycardia leading to asystole requiring CPR.  Arrhythmia was precipitated by severe hyperkalemia since patient missed his dialysis session on Wednesday.  He was treated with emergent dialysis x2 which resulted in improvement of hyperkalemia and stabilization of cardiac rhythm.   Assessment & Plan:   Principal Problem:   Cardiac arrest ([place]) Active Problems:   DM2 (diabetes mellitus, type 2) ([place])   HLD (hyperlipidemia)   S/P below knee amputation, right (HCC)   Anemia due to end stage renal disease (HCC)   Hyperkalemia   Weakness   ESRD on hemodialysis (HCC)   CHF (congestive heart failure) (HCC)   Atrial flutter ([place])   1. Cardiac arrest/bradycardia/asystole.  Due to severe hyperkalemia, patient became increasingly bradycardic and eventually developed asystole.  He underwent CPR with chest compressions and received epinephrine.  ROSC was achieved.  He did not need to be intubated.  He was placed on pacer pads as well as dopamine to support heart rate until potassium could be lowered.  Since he is undergoing dialysis, potassium has improved, pacer pads have been discontinued and has been weaned off of dopamine. 2. Mild to moderate respiratory distress.  Likely related to volume overload from missing dialysis.  He was requiring 3 L of oxygen.  He has since been weaned off of oxygen and is now breathing comfortably on room air. 3. Hyperkalemia.  Related to end-stage renal disease.  Patient was dialyzed twice yesterday in order to normalize potassium. 4. End-stage renal disease on hemodialysis.  Nephrology following.  Underwent dialysis twice  yesterday.  His normal dialysis days are Mondays, Wednesdays, Friday.  Patient missed his treatment on Wednesday. 5. Leukocytosis.  No signs of infection.  Likely reactive. 6. Chronic systolic congestive heart failure.  Ejection fraction of 20 to 25% from echo on 02/2019.  Maintenance of volume status per dialysis. 7. History of atrial flutter.  Status post cardioversion in 02/2019.  Continue on amiodarone and anticoagulation with Eliquis. 8. Diabetes.  Continue on sliding scale insulin.  He is also on basal insulin.  Continue to follow blood sugars. 9. Noncompliance.  Patient has had multiple near death experiences due to missing dialysis and severe hyperkalemia.  It is unclear whether he has underlying depression and these are passive at times for self-harm versus poor health literacy.  Psychiatry consulted.   DVT prophylaxis: Apixaban Code Status: Full code Family Communication: Discussed with patient Disposition Plan: Status is: Inpatient  Remains inpatient appropriate because:Ongoing diagnostic testing needed not appropriate for outpatient work up   Dispo: The patient is from: Home              Anticipated d/c is to: Pending psychiatry evaluation              Anticipated d/c date is: 1 day              Patient currently is medically stable to d/c.  Pending if he is cleared by psychiatry    Consultants:   Nephrology  Critical care  Cardiology  Procedures:     Antimicrobials:       Subjective: Reports some soreness in his chest.  Denies any shortness of breath.  Objective: Vitals:  06/08/19 1300 06/08/19 1500 06/08/19 1600 06/08/19 1700  BP: (!) 91/55 (!) 113/49 121/69   Pulse: 82 82 82   Resp:  18 16   Temp:    98.5 F (36.9 C)  TempSrc:    Oral  SpO2: 98% 96% 97%   Weight:      Height:        Intake/Output Summary (Last 24 hours) at 06/08/2019 1938 Last data filed at 06/08/2019 1300 Gross per 24 hour  Intake 74.75 ml  Output -  Net 74.75 ml   Filed  Weights   06/07/19 0957 06/07/19 2130 06/08/19 0300  Weight: 98.4 kg 98.4 kg 90.7 kg    Examination:  General exam: Appears calm and comfortable  Respiratory system: Clear to auscultation. Respiratory effort normal. Cardiovascular system: S1 & S2 heard, RRR. No JVD, murmurs, rubs, gallops or clicks. No pedal edema. Gastrointestinal system: Abdomen is nondistended, soft and nontender. No organomegaly or masses felt. Normal bowel sounds heard. Central nervous system: Alert and oriented. No focal neurological deficits. Extremities: Right below the knee potation, left great toe amputation Skin: No rashes, lesions or ulcers Psychiatry: Judgement and insight appear normal. Mood & affect appropriate.     Data Reviewed: I have personally reviewed following labs and imaging studies  CBC: Recent Labs  Lab 06/07/19 1126  WBC 15.0*  NEUTROABS 13.2*  HGB 12.0*  HCT 36.2*  MCV 107.7*  PLT 673   Basic Metabolic Panel: Recent Labs  Lab 06/07/19 1125 06/07/19 1551 06/07/19 1941 06/08/19 0520  NA 138 137 138 134*  K 6.8* 7.1* 7.0* 4.5  CL 99 97* 95* 88*  CO2 17* 24 26 30   GLUCOSE 321* 123* 267* 87  BUN 90* 55* 59* 22*  CREATININE 12.27* 8.59* 9.30* 4.66*  CALCIUM 7.3* 7.4*  7.4* 7.3* 8.5*  MG  --  2.3 2.4  --   PHOS  --  6.2*  6.3*  --  5.7*   GFR: Estimated Creatinine Clearance: 21.3 mL/min (A) (by C-G formula based on SCr of 4.66 mg/dL (H)). Liver Function Tests: Recent Labs  Lab 06/07/19 1125 06/07/19 1551 06/08/19 0520  AST 53*  --   --   ALT 39  --   --   ALKPHOS 93  --   --   BILITOT 1.1  --   --   PROT 6.7  --   --   ALBUMIN 3.6 3.9 4.0   Recent Labs  Lab 06/07/19 1125  LIPASE 33   No results for input(s): AMMONIA in the last 168 hours. Coagulation Profile: Recent Labs  Lab 06/07/19 1125 06/08/19 0520  INR 1.4* 1.3*   Cardiac Enzymes: No results for input(s): CKTOTAL, CKMB, CKMBINDEX, TROPONINI in the last 168 hours. BNP (last 3 results) No  results for input(s): PROBNP in the last 8760 hours. HbA1C: Recent Labs    06/07/19 1109  HGBA1C 5.2   CBG: Recent Labs  Lab 06/07/19 0950 06/08/19 0744  GLUCAP 151* 76   Lipid Profile: No results for input(s): CHOL, HDL, LDLCALC, TRIG, CHOLHDL, LDLDIRECT in the last 72 hours. Thyroid Function Tests: Recent Labs    06/07/19 1109  TSH 3.079   Anemia Panel: No results for input(s): VITAMINB12, FOLATE, FERRITIN, TIBC, IRON, RETICCTPCT in the last 72 hours. Sepsis Labs: No results for input(s): PROCALCITON, LATICACIDVEN in the last 168 hours.  Recent Results (from the past 240 hour(s))  Respiratory Panel by RT PCR (Flu A&B, Covid) - Nasopharyngeal Swab     Status:  None   Collection Time: 06/07/19 11:35 AM   Specimen: Nasopharyngeal Swab  Result Value Ref Range Status   SARS Coronavirus 2 by RT PCR NEGATIVE NEGATIVE Final    Comment: (NOTE) SARS-CoV-2 target nucleic acids are NOT DETECTED. The SARS-CoV-2 RNA is generally detectable in upper respiratoy specimens during the acute phase of infection. The lowest concentration of SARS-CoV-2 viral copies this assay can detect is 131 copies/mL. A negative result does not preclude SARS-Cov-2 infection and should not be used as the sole basis for treatment or other patient management decisions. A negative result may occur with  improper specimen collection/handling, submission of specimen other than nasopharyngeal swab, presence of viral mutation(s) within the areas targeted by this assay, and inadequate number of viral copies (<131 copies/mL). A negative result must be combined with clinical observations, patient history, and epidemiological information. The expected result is Negative. Fact Sheet for Patients:  PinkCheek.be Fact Sheet for Healthcare Providers:  GravelBags.it This test is not yet ap proved or cleared by the [place] FDA and  has been authorized for  detection and/or diagnosis of SARS-CoV-2 by FDA under an Emergency Use Authorization (EUA). This EUA will remain  in effect (meaning this test can be used) for the duration of the COVID-19 declaration under Section 564(b)(1) of the Act, 21 U.S.C. section 360bbb-3(b)(1), unless the authorization is terminated or revoked sooner.    Influenza A by PCR NEGATIVE NEGATIVE Final   Influenza B by PCR NEGATIVE NEGATIVE Final    Comment: (NOTE) The Xpert Xpress SARS-CoV-2/FLU/RSV assay is intended as an aid in  the diagnosis of influenza from Nasopharyngeal swab specimens and  should not be used as a sole basis for treatment. Nasal washings and  aspirates are unacceptable for Xpert Xpress SARS-CoV-2/FLU/RSV  testing. Fact Sheet for Patients: PinkCheek.be Fact Sheet for Healthcare Providers: GravelBags.it This test is not yet approved or cleared by the [place] FDA and  has been authorized for detection and/or diagnosis of SARS-CoV-2 by  FDA under an Emergency Use Authorization (EUA). This EUA will remain  in effect (meaning this test can be used) for the duration of the  Covid-19 declaration under Section 564(b)(1) of the Act, 21  U.S.C. section 360bbb-3(b)(1), unless the authorization is  terminated or revoked. Performed at [facility], [address], [place], [place] [address]   MRSA PCR Screening     Status: None   Collection Time: 06/08/19  2:00 AM   Specimen: Nasopharyngeal  Result Value Ref Range Status   MRSA by PCR NEGATIVE NEGATIVE Final    Comment:        The GeneXpert MRSA Assay (FDA approved for NASAL specimens only), is one component of a comprehensive MRSA colonization surveillance program. It is not intended to diagnose MRSA infection nor to guide or monitor treatment for MRSA infections. Performed at [facility], [address] [place], [place] [address]          Radiology Studies: [facility]  1 View  Result Date: 06/07/2019 CLINICAL DATA:  Weakness and shortness of breath. EXAM: PORTABLE CHEST 1 VIEW COMPARISON:  05/16/2019 and prior radiographs FINDINGS: Cardiomegaly, pulmonary vascular congestion and mild interstitial pulmonary edema noted. No focal airspace disease, consolidation, mass, large pleural effusion or pneumothorax noted. A defibrillator/pacing pad overlying the LEFT chest is noted. No acute bony abnormalities identified. IMPRESSION: Cardiomegaly with pulmonary vascular congestion and mild interstitial pulmonary edema. Electronically Signed   By: Margarette Canada M.D.   On: 06/07/2019 10:10  Scheduled Meds: . apixaban  5 mg Oral BID  . aspirin EC  81 mg Oral Daily  . atorvastatin  10 mg Oral QPM  . calcium acetate  667 mg Oral TID WC  . Chlorhexidine Gluconate Cloth  6 each Topical Q0600  . docusate sodium  100 mg Oral BID  . insulin glargine  10 Units Subcutaneous QHS  . sodium chloride flush  3 mL Intravenous Q12H   Continuous Infusions: . sodium chloride    . sodium chloride    . DOPamine Stopped (06/08/19 1053)     LOS: 1 day    Time spent: 7mins    Kathie Dike, MD [facility]   If 7PM-7AM, please contact night-coverage [email or web address]  06/08/2019, 7:38 PM

## 2019-06-08 NOTE — BH Assessment (Signed)
[place] Assessment Progress Note    TTS contacted ICU.  Patient has not been medically cleared.  TTS assessment needs to be completed when patient is closer to being medically cleared since he could be recommended for inpatient treatment.  TTS will check-in tomorrow morning to see if patient is more medically stable for his assessment.  Please call TTS if he shows improvement to the point he is ready to step down to lower level of care or able to be discharged from the hospital. [phone] or [phone].  Thank you.

## 2019-06-08 NOTE — Progress Notes (Signed)
Instructed by MD to hold Lantus d/t glucose of 71

## 2019-06-09 ENCOUNTER — Encounter (HOSPITAL_COMMUNITY): Payer: Self-pay | Admitting: Family Medicine

## 2019-06-09 LAB — CBC
HCT: 40 % (ref 39.0–52.0)
Hemoglobin: 12.7 g/dL — ABNORMAL LOW (ref 13.0–17.0)
MCH: 33.3 pg (ref 26.0–34.0)
MCHC: 31.8 g/dL (ref 30.0–36.0)
MCV: 105 fL — ABNORMAL HIGH (ref 80.0–100.0)
Platelets: 188 10*3/uL (ref 150–400)
RBC: 3.81 MIL/uL — ABNORMAL LOW (ref 4.22–5.81)
RDW: 13.1 % (ref 11.5–15.5)
WBC: 8 10*3/uL (ref 4.0–10.5)
nRBC: 0 % (ref 0.0–0.2)

## 2019-06-09 LAB — RENAL FUNCTION PANEL
Albumin: 3.5 g/dL (ref 3.5–5.0)
Anion gap: 15 (ref 5–15)
BUN: 47 mg/dL — ABNORMAL HIGH (ref 6–20)
CO2: 27 mmol/L (ref 22–32)
Calcium: 7.8 mg/dL — ABNORMAL LOW (ref 8.9–10.3)
Chloride: 92 mmol/L — ABNORMAL LOW (ref 98–111)
Creatinine, Ser: 6.93 mg/dL — ABNORMAL HIGH (ref 0.61–1.24)
GFR calc Af Amer: 10 mL/min — ABNORMAL LOW (ref >60)
GFR calc non Af Amer: 9 mL/min — ABNORMAL LOW (ref >60)
Glucose, Bld: 68 mg/dL — ABNORMAL LOW (ref 70–99)
Phosphorus: 7.8 mg/dL — ABNORMAL HIGH (ref 2.5–4.6)
Potassium: 4.4 mmol/L (ref 3.5–5.1)
Sodium: 134 mmol/L — ABNORMAL LOW (ref 135–145)

## 2019-06-09 LAB — GLUCOSE, CAPILLARY
Glucose-Capillary: 62 mg/dL — ABNORMAL LOW (ref 70–99)
Glucose-Capillary: 63 mg/dL — ABNORMAL LOW (ref 70–99)
Glucose-Capillary: 80 mg/dL (ref 70–99)
Glucose-Capillary: 72 mg/dL (ref 70–99)
Glucose-Capillary: 85 mg/dL (ref 70–99)
Glucose-Capillary: 106 mg/dL — ABNORMAL HIGH (ref 70–99)

## 2019-06-09 MED ORDER — VELTASSA 8.4 G PO PACK: 8.4000 g | PACK | ORAL | 0 refills | Status: DC

## 2019-06-09 MED ORDER — HYDROCODONE-ACETAMINOPHEN 5-325 MG PO TABS: 1.0000 | ORAL_TABLET | Freq: Four times a day (QID) | ORAL | 0 refills | Status: DC | PRN

## 2019-06-09 MED ORDER — DEXTROSE 50 % IV SOLN
INTRAVENOUS | Status: AC
  Administered 2019-06-09: 08:00:00 25 mL
  Filled 2019-06-09: qty 50

## 2019-06-09 MED ORDER — HYDROCODONE-ACETAMINOPHEN 5-325 MG PO TABS
1.0000 | ORAL_TABLET | Freq: Four times a day (QID) | ORAL | Status: DC | PRN
  Administered 2019-06-09 – 2019-06-10 (×4): 2 via ORAL
  Filled 2019-06-09 (×4): qty 2

## 2019-06-09 MED ORDER — SODIUM CHLORIDE 0.9 % IV BOLUS
500.0000 mL | Freq: Once | INTRAVENOUS | Status: AC
  Administered 2019-06-09: 500 mL via INTRAVENOUS

## 2019-06-09 NOTE — Progress Notes (Signed)
PROGRESS NOTE    Brendan Campbell  JHE:[id number] DOB: 05-17-1970 DOA: 06/07/2019 PCP: Monico Blitz, MD    Brief Narrative:  49 year old male with a history of end-stage renal disease on hemodialysis, chronic systolic heart failure, atrial flutter, peripheral vascular disease status post BKA, admitted to the hospital after he developed severe bradycardia leading to asystole requiring CPR.  Arrhythmia was precipitated by severe hyperkalemia since patient missed his dialysis session on Wednesday.  He was treated with emergent dialysis x2 which resulted in improvement of hyperkalemia and stabilization of cardiac rhythm.   Assessment & Plan:   Principal Problem:   Cardiac arrest ([place]) Active Problems:   DM2 (diabetes mellitus, type 2) ([place])   HLD (hyperlipidemia)   S/P below knee amputation, right (HCC)   Anemia due to end stage renal disease (HCC)   Hyperkalemia   Weakness   ESRD on hemodialysis (HCC)   CHF (congestive heart failure) (HCC)   Atrial flutter ([place])   1. Cardiac arrest/bradycardia/asystole.  Due to severe hyperkalemia, patient became increasingly bradycardic and eventually developed asystole.  He underwent CPR with chest compressions and received epinephrine.  ROSC was achieved.  He did not need to be intubated.  He was placed on pacer pads as well as dopamine to support heart rate until potassium could be lowered.  Since he underwent dialysis, potassium has improved, pacer pads have been discontinued and has been weaned off of dopamine. 2. Mild to moderate respiratory distress.  Likely related to volume overload from missing dialysis.  He was requiring 3 L of oxygen.  He has since been weaned off of oxygen and is now breathing comfortably on room air. 3. Hyperkalemia.  Related to end-stage renal disease.  Patient was dialyzed twice on the day of admission in order to normalize potassium.  Discussed with nephrology and will discharge the patient home with [place] to be used on  nondialysis days. 4. End-stage renal disease on hemodialysis.  Nephrology following.  Underwent dialysis twice yesterday.  His normal dialysis days are Mondays, Wednesdays, Friday.  Patient missed his treatment on Wednesday prior to admission. 5. Leukocytosis.  No signs of infection.  Likely reactive. 6. Chronic systolic congestive heart failure.  Ejection fraction of 20 to 25% from echo on 02/2019.  Maintenance of volume status per dialysis. 7. Hypotension.  Patient reports feeling dizzy and pressures in the 80s.  Will give fluid bolus and monitor blood pressures.  He is not appear septic or toxic. 8. History of atrial flutter.  Status post cardioversion in 02/2019.  Continue on amiodarone and anticoagulation with Eliquis. 9. Diabetes.  Continue on sliding scale insulin.  He is also on basal insulin.  Continue to follow blood sugars. 10. Noncompliance.  Patient has had multiple near death experiences due to missing dialysis and severe hyperkalemia.  It is unclear whether he has underlying depression and these are passive at times for self-harm versus poor health literacy.  Psychiatry consulted and did not feel that patient was suicidal.  He has been cleared for discharge.   DVT prophylaxis: Apixaban Code Status: Full code Family Communication: Discussed with patient Disposition Plan: Status is: Inpatient  Remains inpatient appropriate because:Ongoing diagnostic testing needed not appropriate for outpatient work up.  Currently patient is hypotensive   Dispo: The patient is from: Home              Anticipated d/c is to: Home              Anticipated d/c date is:  1 day              Patient currently is not medically stable to d/c.      Consultants:   Nephrology  Critical care  Cardiology  Psychiatry  Procedures:     Antimicrobials:       Subjective: Reports some dizziness.  No shortness of breath.  He does have soreness in his chest that is worse with deep inspiration, eating  drinking or any movement  Objective: Vitals:   06/09/19 0953 06/09/19 1100 06/09/19 1408 06/09/19 1853  BP: (!) 84/37 (!) 93/58 (!) 84/33 (!) 85/50  Pulse: 78 75 81 79  Resp: 16  16   Temp: 98.2 F (36.8 C)  98.3 F (36.8 C)   TempSrc: Oral  Oral   SpO2: 100%  100%   Weight:      Height:        Intake/Output Summary (Last 24 hours) at 06/09/2019 2012 Last data filed at 06/09/2019 1000 Gross per 24 hour  Intake 444 ml  Output --  Net 444 ml   Filed Weights   06/07/19 2130 06/08/19 0300 06/09/19 0455  Weight: 98.4 kg 90.7 kg 90.6 kg    Examination:  General exam: Appears calm and comfortable  Respiratory system: Clear bilaterally. Respiratory effort normal. Cardiovascular system: S1 & S2 heard, RRR. No JVD, murmurs, rubs, gallops or clicks. No pedal edema. Gastrointestinal system: Abdomen is nondistended, soft and nontender. No organomegaly or masses felt. Normal bowel sounds heard. Central nervous system: Alert and oriented. No focal neurological deficits. Extremities: Right below the knee amputation, left great toe amputation Skin: No rashes, lesions or ulcers Psychiatry: Judgement and insight appear normal. Mood & affect appropriate.     Data Reviewed: I have personally reviewed following labs and imaging studies  CBC: Recent Labs  Lab 06/07/19 1126 06/09/19 0629  WBC 15.0* 8.0  NEUTROABS 13.2*  --   HGB 12.0* 12.7*  HCT 36.2* 40.0  MCV 107.7* 105.0*  PLT 265 283   Basic Metabolic Panel: Recent Labs  Lab 06/07/19 1125 06/07/19 1551 06/07/19 1941 06/08/19 0520 06/09/19 0629  NA 138 137 138 134* 134*  K 6.8* 7.1* 7.0* 4.5 4.4  CL 99 97* 95* 88* 92*  CO2 17* 24 26 30 27   GLUCOSE 321* 123* 267* 87 68*  BUN 90* 55* 59* 22* 47*  CREATININE 12.27* 8.59* 9.30* 4.66* 6.93*  CALCIUM 7.3* 7.4*  7.4* 7.3* 8.5* 7.8*  MG  --  2.3 2.4  --   --   PHOS  --  6.2*  6.3*  --  5.7* 7.8*   GFR: Estimated Creatinine Clearance: 14.3 mL/min (A) (by C-G formula based  on SCr of 6.93 mg/dL (H)). Liver Function Tests: Recent Labs  Lab 06/07/19 1125 06/07/19 1551 06/08/19 0520 06/09/19 0629  AST 53*  --   --   --   ALT 39  --   --   --   ALKPHOS 93  --   --   --   BILITOT 1.1  --   --   --   PROT 6.7  --   --   --   ALBUMIN 3.6 3.9 4.0 3.5   Recent Labs  Lab 06/07/19 1125  LIPASE 33   No results for input(s): AMMONIA in the last 168 hours. Coagulation Profile: Recent Labs  Lab 06/07/19 1125 06/08/19 0520  INR 1.4* 1.3*   Cardiac Enzymes: No results for input(s): CKTOTAL, CKMB, CKMBINDEX, TROPONINI in the last  168 hours. BNP (last 3 results) No results for input(s): PROBNP in the last 8760 hours. HbA1C: Recent Labs    06/07/19 1109  HGBA1C 5.2   CBG: Recent Labs  Lab 06/09/19 0722 06/09/19 0744 06/09/19 0820 06/09/19 1134 06/09/19 1657  GLUCAP 62* 63* 80 72 85   Lipid Profile: No results for input(s): CHOL, HDL, LDLCALC, TRIG, CHOLHDL, LDLDIRECT in the last 72 hours. Thyroid Function Tests: Recent Labs    06/07/19 1109  TSH 3.079   Anemia Panel: No results for input(s): VITAMINB12, FOLATE, FERRITIN, TIBC, IRON, RETICCTPCT in the last 72 hours. Sepsis Labs: No results for input(s): PROCALCITON, LATICACIDVEN in the last 168 hours.  Recent Results (from the past 240 hour(s))  Respiratory Panel by RT PCR (Flu A&B, Covid) - Nasopharyngeal Swab     Status: None   Collection Time: 06/07/19 11:35 AM   Specimen: Nasopharyngeal Swab  Result Value Ref Range Status   SARS Coronavirus 2 by RT PCR NEGATIVE NEGATIVE Final    Comment: (NOTE) SARS-CoV-2 target nucleic acids are NOT DETECTED. The SARS-CoV-2 RNA is generally detectable in upper respiratoy specimens during the acute phase of infection. The lowest concentration of SARS-CoV-2 viral copies this assay can detect is 131 copies/mL. A negative result does not preclude SARS-Cov-2 infection and should not be used as the sole basis for treatment or other patient management  decisions. A negative result may occur with  improper specimen collection/handling, submission of specimen other than nasopharyngeal swab, presence of viral mutation(s) within the areas targeted by this assay, and inadequate number of viral copies (<131 copies/mL). A negative result must be combined with clinical observations, patient history, and epidemiological information. The expected result is Negative. Fact Sheet for Patients:  PinkCheek.be Fact Sheet for Healthcare Providers:  GravelBags.it This test is not yet ap proved or cleared by the [place] FDA and  has been authorized for detection and/or diagnosis of SARS-CoV-2 by FDA under an Emergency Use Authorization (EUA). This EUA will remain  in effect (meaning this test can be used) for the duration of the COVID-19 declaration under Section 564(b)(1) of the Act, 21 U.S.C. section 360bbb-3(b)(1), unless the authorization is terminated or revoked sooner.    Influenza A by PCR NEGATIVE NEGATIVE Final   Influenza B by PCR NEGATIVE NEGATIVE Final    Comment: (NOTE) The Xpert Xpress SARS-CoV-2/FLU/RSV assay is intended as an aid in  the diagnosis of influenza from Nasopharyngeal swab specimens and  should not be used as a sole basis for treatment. Nasal washings and  aspirates are unacceptable for Xpert Xpress SARS-CoV-2/FLU/RSV  testing. Fact Sheet for Patients: PinkCheek.be Fact Sheet for Healthcare Providers: GravelBags.it This test is not yet approved or cleared by the [place] FDA and  has been authorized for detection and/or diagnosis of SARS-CoV-2 by  FDA under an Emergency Use Authorization (EUA). This EUA will remain  in effect (meaning this test can be used) for the duration of the  Covid-19 declaration under Section 564(b)(1) of the Act, 21  U.S.C. section 360bbb-3(b)(1), unless the authorization  is  terminated or revoked. Performed at [facility], [address], [place], [place] [address]   MRSA PCR Screening     Status: None   Collection Time: 06/08/19  2:00 AM   Specimen: Nasopharyngeal  Result Value Ref Range Status   MRSA by PCR NEGATIVE NEGATIVE Final    Comment:        The GeneXpert MRSA Assay (FDA approved for NASAL specimens only), is  one component of a comprehensive MRSA colonization surveillance program. It is not intended to diagnose MRSA infection nor to guide or monitor treatment for MRSA infections. Performed at [facility], [address], [place], [place] [address]          Radiology Studies: No results found.      Scheduled Meds: . apixaban  5 mg Oral BID  . aspirin EC  81 mg Oral Daily  . atorvastatin  10 mg Oral QPM  . calcium acetate  667 mg Oral TID WC  . Chlorhexidine Gluconate Cloth  6 each Topical Q0600  . docusate sodium  100 mg Oral BID  . insulin aspart  0-6 Units Subcutaneous TID WC  . sodium chloride flush  3 mL Intravenous Q12H   Continuous Infusions: . sodium chloride    . sodium chloride       LOS: 2 days    Time spent: 34mins    Kathie Dike, MD [facility]   If 7PM-7AM, please contact night-coverage [email or web address]  06/09/2019, 8:12 PM
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
## 2019-06-09 NOTE — Progress Notes (Signed)
Hypoglycemic Event  CBG: 62  Treatment: 4 oz orange juice  Symptoms: asymptomatic  Follow-up CBG: Time: 0744 CBG Result:63  Treatment: 62mL dextrose IV  Follow-up CBG: Time: 0820 CBG Result: 80  Possible Reasons for Event: patient reports being unable to eat d/t "sore throat from heart stopping"  Comments/MD notified: Dr. Ulice Bold made aware ([id number])    Marin Shutter

## 2019-06-10 LAB — RENAL FUNCTION PANEL
Albumin: 3.5 g/dL (ref 3.5–5.0)
Anion gap: 17 — ABNORMAL HIGH (ref 5–15)
BUN: 64 mg/dL — ABNORMAL HIGH (ref 6–20)
CO2: 25 mmol/L (ref 22–32)
Calcium: 7.3 mg/dL — ABNORMAL LOW (ref 8.9–10.3)
Chloride: 92 mmol/L — ABNORMAL LOW (ref 98–111)
Creatinine, Ser: 8.68 mg/dL — ABNORMAL HIGH (ref 0.61–1.24)
GFR calc Af Amer: 8 mL/min — ABNORMAL LOW (ref >60)
GFR calc non Af Amer: 6 mL/min — ABNORMAL LOW (ref >60)
Glucose, Bld: 72 mg/dL (ref 70–99)
Phosphorus: 8.7 mg/dL — ABNORMAL HIGH (ref 2.5–4.6)
Potassium: 4.6 mmol/L (ref 3.5–5.1)
Sodium: 134 mmol/L — ABNORMAL LOW (ref 135–145)

## 2019-06-10 LAB — CBC
HCT: 36.8 % — ABNORMAL LOW (ref 39.0–52.0)
Hemoglobin: 11.9 g/dL — ABNORMAL LOW (ref 13.0–17.0)
MCH: 33.8 pg (ref 26.0–34.0)
MCHC: 32.3 g/dL (ref 30.0–36.0)
MCV: 104.5 fL — ABNORMAL HIGH (ref 80.0–100.0)
Platelets: 177 10*3/uL (ref 150–400)
RBC: 3.52 MIL/uL — ABNORMAL LOW (ref 4.22–5.81)
RDW: 13 % (ref 11.5–15.5)
WBC: 7.8 10*3/uL (ref 4.0–10.5)
nRBC: 0 % (ref 0.0–0.2)

## 2019-06-10 LAB — GLUCOSE, CAPILLARY
Glucose-Capillary: 64 mg/dL — ABNORMAL LOW (ref 70–99)
Glucose-Capillary: 93 mg/dL (ref 70–99)

## 2019-06-10 MED FILL — Medication: Qty: 1 | Status: AC

## 2019-06-10 NOTE — Discharge Summary (Signed)
Physician Discharge Summary  Brendan Campbell WJX:[id number] DOB: March 06, 1970 DOA: 06/07/2019  PCP: Monico Blitz, MD  Admit date: 06/07/2019 Discharge date: 06/10/2019  Admitted From: Home Disposition: Home  Recommendations for Outpatient Follow-up:  1. Follow up with PCP in 1-2 weeks 2. Please obtain BMP/CBC in one week 3. Patient has been advised to follow-up with her dialysis center as per regular schedule 4. He plans to follow-up with [facility] for depression  Discharge Condition: Stable CODE STATUS: Full code Diet recommendation: Heart healthy, carb modified  Brief/Interim Summary: 49 year old male with a history of end-stage renal disease on hemodialysis, chronic systolic heart failure, atrial flutter, peripheral vascular disease status post BKA, admitted to the hospital after he developed severe bradycardia leading to asystole requiring CPR.  Arrhythmia was precipitated by severe hyperkalemia since patient missed his dialysis session on Wednesday.  He was treated with emergent dialysis x2 which resulted in improvement of hyperkalemia and stabilization of cardiac rhythm  Discharge Diagnoses:  Principal Problem:   Cardiac arrest ([place]) Active Problems:   DM2 (diabetes mellitus, type 2) ([place])   HLD (hyperlipidemia)   S/P below knee amputation, right (HCC)   Anemia due to end stage renal disease (HCC)   Hyperkalemia   Weakness   ESRD on hemodialysis (HCC)   CHF (congestive heart failure) (HCC)   Atrial flutter ([place])  1. Cardiac arrest/bradycardia/asystole.  Due to severe hyperkalemia, patient became increasingly bradycardic and eventually developed asystole.  He underwent CPR with chest compressions and received epinephrine.  ROSC was achieved.  He did not need to be intubated.  He was placed on pacer pads as well as dopamine to support heart rate until potassium could be lowered.  Since he underwent dialysis, potassium has improved, pacer pads have been discontinued and has been weaned  off of dopamine. 2. Mild to moderate respiratory distress.  Likely related to volume overload from missing dialysis.  He was requiring 3 L of oxygen.  He has since been weaned off of oxygen and is now breathing comfortably on room air. 3. Hyperkalemia.  Related to end-stage renal disease.  Patient was dialyzed twice on the day of admission in order to normalize potassium.  Discussed with nephrology and will discharge the patient home with Veltassa to be used on nondialysis days. 4. End-stage renal disease on hemodialysis.  Nephrology following.  Underwent dialysis twice on the day of admission.  His normal dialysis days are Mondays, Wednesdays, Friday.  Patient missed his treatment on Wednesday prior to admission. 5. Leukocytosis.  No signs of infection.  Likely reactive. 6. Chronic systolic congestive heart failure.  Ejection fraction of 20 to 25% from echo on 02/2019.  Maintenance of volume status per dialysis. 7. History of atrial flutter.  Status post cardioversion in 02/2019.  Continue on amiodarone and anticoagulation with Eliquis. 8. Diabetes.  Continue on sliding scale insulin.  He is also on basal insulin.  Continue to follow blood sugars. 9. Noncompliance.  Patient has had multiple near death experiences due to missing dialysis and severe hyperkalemia.  It is unclear whether he has underlying depression and these are passive at times for self-harm versus poor health literacy.  Psychiatry consulted and did not feel that patient was suicidal.    Recommendations were for outpatient psychiatry follow-up for depression.  He has been cleared for discharge.  Discharge Instructions  Discharge Instructions    Diet - low sodium heart healthy   Complete by: As directed    Diet - low sodium heart healthy  Complete by: As directed    Increase activity slowly   Complete by: As directed    Increase activity slowly   Complete by: As directed      Allergies as of 06/10/2019      Reactions   Tape Other  (See Comments)   Pulls skin off      Medication List    TAKE these medications   amiodarone 200 MG tablet Commonly known as: PACERONE Take 1 tablet (200 mg total) by mouth daily.   apixaban 5 MG Tabs tablet Commonly known as: ELIQUIS Take 1 tablet (5 mg total) by mouth 2 (two) times daily.   aspirin 81 MG chewable tablet Chew 81 mg by mouth daily.   atorvastatin 10 MG tablet Commonly known as: LIPITOR Take 1 tablet (10 mg total) by mouth every evening.   calcium acetate 667 MG capsule Commonly known as: PHOSLO Take 1 capsule (667 mg total) by mouth 3 (three) times daily with meals.   HYDROcodone-acetaminophen 5-325 MG tablet Commonly known as: NORCO/VICODIN Take 1 tablet by mouth every 6 (six) hours as needed for moderate pain.   Lantus SoloStar 100 UNIT/ML Solostar Pen Generic drug: insulin glargine Inject 10 Units into the skin at bedtime.   lidocaine-prilocaine cream Commonly known as: EMLA Apply 1 application topically See admin instructions.   multivitamin Tabs tablet Take 1 tablet by mouth daily.   tiZANidine 4 MG tablet Commonly known as: ZANAFLEX Take 4 mg by mouth daily as needed.   Veltassa 8.4 g packet Generic drug: patiromer Take 1 packet (8.4 g total) by mouth 3 (three) times a week. On Tuesday, Thursday and Saturday       Allergies  Allergen Reactions  . Tape Other (See Comments)    Pulls skin off    Consultations:  Cardiology  Nephrology  Critical care  Psychiatry   Procedures/Studies: DG Chest Port 1 View  Result Date: 06/07/2019 CLINICAL DATA:  Weakness and shortness of breath. EXAM: PORTABLE CHEST 1 VIEW COMPARISON:  05/16/2019 and prior radiographs FINDINGS: Cardiomegaly, pulmonary vascular congestion and mild interstitial pulmonary edema noted. No focal airspace disease, consolidation, mass, large pleural effusion or pneumothorax noted. A defibrillator/pacing pad overlying the LEFT chest is noted. No acute bony abnormalities  identified. IMPRESSION: Cardiomegaly with pulmonary vascular congestion and mild interstitial pulmonary edema. Electronically Signed   By: Margarette Canada M.D.   On: 06/07/2019 10:10   DG Chest Portable 1 View  Result Date: 05/16/2019 CLINICAL DATA:  Shortness of breath EXAM: PORTABLE CHEST 1 VIEW COMPARISON:  02/16/2019 FINDINGS: Cardiac shadow is enlarged but stable. Mild vascular congestion is seen with minimal interstitial edema. No sizable effusion is noted. No focal infiltrate is seen. No bony abnormality is noted. IMPRESSION: Changes of mild CHF. Electronically Signed   By: Inez Catalina M.D.   On: 05/16/2019 08:24       Subjective: No shortness of breath.  Still has some chest soreness.  Discharge Exam: Vitals:   06/09/19 2041 06/10/19 0543 06/10/19 0713 06/10/19 0748  BP: 114/69 119/71    Pulse: 73 77    Resp: 19 17    Temp: 97.6 F (36.4 C) 98.3 F (36.8 C)    TempSrc: Oral Oral    SpO2: 96% 95%  95%  Weight:  90.8 kg 91.5 kg   Height:        General: Pt is alert, awake, not in acute distress Cardiovascular: RRR, S1/S2 +, no rubs, no gallops Respiratory: CTA bilaterally, no wheezing, no  rhonchi Abdominal: Soft, NT, ND, bowel sounds + Extremities: Right below the knee amputation, left great toe amputation    The results of significant diagnostics from this hospitalization (including imaging, microbiology, ancillary and laboratory) are listed below for reference.     Microbiology: Recent Results (from the past 240 hour(s))  Respiratory Panel by RT PCR (Flu A&B, Covid) - Nasopharyngeal Swab     Status: None   Collection Time: 06/07/19 11:35 AM   Specimen: Nasopharyngeal Swab  Result Value Ref Range Status   SARS Coronavirus 2 by RT PCR NEGATIVE NEGATIVE Final    Comment: (NOTE) SARS-CoV-2 target nucleic acids are NOT DETECTED. The SARS-CoV-2 RNA is generally detectable in upper respiratoy specimens during the acute phase of infection. The lowest concentration of  SARS-CoV-2 viral copies this assay can detect is 131 copies/mL. A negative result does not preclude SARS-Cov-2 infection and should not be used as the sole basis for treatment or other patient management decisions. A negative result may occur with  improper specimen collection/handling, submission of specimen other than nasopharyngeal swab, presence of viral mutation(s) within the areas targeted by this assay, and inadequate number of viral copies (<131 copies/mL). A negative result must be combined with clinical observations, patient history, and epidemiological information. The expected result is Negative. Fact Sheet for Patients:  PinkCheek.be Fact Sheet for Healthcare Providers:  GravelBags.it This test is not yet ap proved or cleared by the [place] FDA and  has been authorized for detection and/or diagnosis of SARS-CoV-2 by FDA under an Emergency Use Authorization (EUA). This EUA will remain  in effect (meaning this test can be used) for the duration of the COVID-19 declaration under Section 564(b)(1) of the Act, 21 U.S.C. section 360bbb-3(b)(1), unless the authorization is terminated or revoked sooner.    Influenza A by PCR NEGATIVE NEGATIVE Final   Influenza B by PCR NEGATIVE NEGATIVE Final    Comment: (NOTE) The Xpert Xpress SARS-CoV-2/FLU/RSV assay is intended as an aid in  the diagnosis of influenza from Nasopharyngeal swab specimens and  should not be used as a sole basis for treatment. Nasal washings and  aspirates are unacceptable for Xpert Xpress SARS-CoV-2/FLU/RSV  testing. Fact Sheet for Patients: PinkCheek.be Fact Sheet for Healthcare Providers: GravelBags.it This test is not yet approved or cleared by the [place] FDA and  has been authorized for detection and/or diagnosis of SARS-CoV-2 by  FDA under an Emergency Use Authorization (EUA).  This EUA will remain  in effect (meaning this test can be used) for the duration of the  Covid-19 declaration under Section 564(b)(1) of the Act, 21  U.S.C. section 360bbb-3(b)(1), unless the authorization is  terminated or revoked. Performed at [facility], [address], [place], [place] [address]   MRSA PCR Screening     Status: None   Collection Time: 06/08/19  2:00 AM   Specimen: Nasopharyngeal  Result Value Ref Range Status   MRSA by PCR NEGATIVE NEGATIVE Final    Comment:        The GeneXpert MRSA Assay (FDA approved for NASAL specimens only), is one component of a comprehensive MRSA colonization surveillance program. It is not intended to diagnose MRSA infection nor to guide or monitor treatment for MRSA infections. Performed at [facility], [address] [place], [place] [address]      Labs: BNP (last 3 results) Recent Labs    02/15/19 0500 05/16/19 0814 06/07/19 1125  BNP 3,499.0* 928.0* 8,099.8*   Basic Metabolic Panel: Recent Labs  Lab 06/07/19 1551 06/07/19 1941 06/08/19 0520 06/09/19 0629 06/10/19 0505  NA 137 138 134* 134* 134*  K 7.1* 7.0* 4.5 4.4 4.6  CL 97* 95* 88* 92* 92*  CO2 24 26 30 27 25   GLUCOSE 123* 267* 87 68* 72  BUN 55* 59* 22* 47* 64*  CREATININE 8.59* 9.30* 4.66* 6.93* 8.68*  CALCIUM 7.4*  7.4* 7.3* 8.5* 7.8* 7.3*  MG 2.3 2.4  --   --   --   PHOS 6.2*  6.3*  --  5.7* 7.8* 8.7*   Liver Function Tests: Recent Labs  Lab 06/07/19 1125 06/07/19 1551 06/08/19 0520 06/09/19 0629 06/10/19 0505  AST 53*  --   --   --   --   ALT 39  --   --   --   --   ALKPHOS 93  --   --   --   --   BILITOT 1.1  --   --   --   --   PROT 6.7  --   --   --   --   ALBUMIN 3.6 3.9 4.0 3.5 3.5   Recent Labs  Lab 06/07/19 1125  LIPASE 33   No results for input(s): AMMONIA in the last 168 hours. CBC: Recent Labs  Lab 06/07/19 1126 06/09/19 0629 06/10/19 0505  WBC 15.0* 8.0 7.8  NEUTROABS 13.2*  --   --   HGB 12.0* 12.7* 11.9*  HCT  36.2* 40.0 36.8*  MCV 107.7* 105.0* 104.5*  PLT 265 188 177   Cardiac Enzymes: No results for input(s): CKTOTAL, CKMB, CKMBINDEX, TROPONINI in the last 168 hours. BNP: Invalid input(s): POCBNP CBG: Recent Labs  Lab 06/09/19 1134 06/09/19 1657 06/09/19 2028 06/10/19 0805 06/10/19 1015  GLUCAP 72 85 106* 64* 93   D-Dimer No results for input(s): DDIMER in the last 72 hours. Hgb A1c No results for input(s): HGBA1C in the last 72 hours. Lipid Profile No results for input(s): CHOL, HDL, LDLCALC, TRIG, CHOLHDL, LDLDIRECT in the last 72 hours. Thyroid function studies No results for input(s): TSH, T4TOTAL, T3FREE, THYROIDAB in the last 72 hours.  Invalid input(s): FREET3 Anemia work up No results for input(s): VITAMINB12, FOLATE, FERRITIN, TIBC, IRON, RETICCTPCT in the last 72 hours. Urinalysis No results found for: COLORURINE, APPEARANCEUR, [place], [place], [place], [place], [place], [place], PROTEINUR, UROBILINOGEN, NITRITE, LEUKOCYTESUR Sepsis Labs Invalid input(s): PROCALCITONIN,  WBC,  LACTICIDVEN Microbiology Recent Results (from the past 240 hour(s))  Respiratory Panel by RT PCR (Flu A&B, Covid) - Nasopharyngeal Swab     Status: None   Collection Time: 06/07/19 11:35 AM   Specimen: Nasopharyngeal Swab  Result Value Ref Range Status   SARS Coronavirus 2 by RT PCR NEGATIVE NEGATIVE Final    Comment: (NOTE) SARS-CoV-2 target nucleic acids are NOT DETECTED. The SARS-CoV-2 RNA is generally detectable in upper respiratoy specimens during the acute phase of infection. The lowest concentration of SARS-CoV-2 viral copies this assay can detect is 131 copies/mL. A negative result does not preclude SARS-Cov-2 infection and should not be used as the sole basis for treatment or other patient management decisions. A negative result may occur with  improper specimen collection/handling, submission of specimen other than nasopharyngeal swab, presence of viral mutation(s) within  the areas targeted by this assay, and inadequate number of viral copies (<131 copies/mL). A negative result must be combined with clinical observations, patient history, and epidemiological information. The expected result is Negative. Fact Sheet for Patients:  PinkCheek.be Fact Sheet for Healthcare Providers:  GravelBags.it This test is not yet ap proved or cleared by the [place] and  has been authorized for detection and/or diagnosis of SARS-CoV-2 by FDA under an Emergency Use Authorization (EUA). This EUA will remain  in effect (meaning this test can be used) for the duration of the COVID-19 declaration under Section 564(b)(1) of the Act, 21 U.S.C. section 360bbb-3(b)(1), unless the authorization is terminated or revoked sooner.    Influenza A by PCR NEGATIVE NEGATIVE Final   Influenza B by PCR NEGATIVE NEGATIVE Final    Comment: (NOTE) The Xpert Xpress SARS-CoV-2/FLU/RSV assay is intended as an aid in  the diagnosis of influenza from Nasopharyngeal swab specimens and  should not be used as a sole basis for treatment. Nasal washings and  aspirates are unacceptable for Xpert Xpress SARS-CoV-2/FLU/RSV  testing. Fact Sheet for Patients: PinkCheek.be Fact Sheet for Healthcare Providers: GravelBags.it This test is not yet approved or cleared by the [place] FDA and  has been authorized for detection and/or diagnosis of SARS-CoV-2 by  FDA under an Emergency Use Authorization (EUA). This EUA will remain  in effect (meaning this test can be used) for the duration of the  Covid-19 declaration under Section 564(b)(1) of the Act, 21  U.S.C. section 360bbb-3(b)(1), unless the authorization is  terminated or revoked. Performed at [facility], [address] [place], [place] [address]   MRSA PCR Screening     Status: None   Collection Time: 06/08/19  2:00 AM    Specimen: Nasopharyngeal  Result Value Ref Range Status   MRSA by PCR NEGATIVE NEGATIVE Final    Comment:        The GeneXpert MRSA Assay (FDA approved for NASAL specimens only), is one component of a comprehensive MRSA colonization surveillance program. It is not intended to diagnose MRSA infection nor to guide or monitor treatment for MRSA infections. Performed at [facility], [address] [place],  [address]      Time coordinating discharge: 71mins  SIGNED:   Kathie Dike, MD  [facility] 06/10/2019, 8:49 PM   If 7PM-7AM, please contact night-coverage [email or web address]

## 2019-06-10 NOTE — TOC Transition Note (Signed)
Transition of Care [facility]) - CM/SW Discharge Note   Patient Details  Name: Brendan Campbell MRN: [id number] Date of Birth: 1970-12-10  Transition of Care [facility]) CM/SW Contact:  Boneta Lucks, RN Phone Number: 06/10/2019, 10:30 AM   Clinical Narrative:   Patient discharging with dialysis appointment at 11:00 at [facility].  Patient states he does not have a ride.  TOC called [facility], $30 fee and they will pick up at 10:30. AC did voucher, MD and RN updated.       Barriers to Discharge: Barriers Resolved

## 2019-06-21 ENCOUNTER — Other Ambulatory Visit: Payer: Self-pay | Admitting: Student

## 2019-06-21 ENCOUNTER — Other Ambulatory Visit (HOSPITAL_COMMUNITY): Payer: Self-pay | Admitting: Nephrology

## 2019-06-21 DIAGNOSIS — N186 End stage renal disease: Secondary | ICD-10-CM

## 2019-06-21 DIAGNOSIS — Z992 Dependence on renal dialysis: Secondary | ICD-10-CM

## 2019-06-22 ENCOUNTER — Ambulatory Visit (HOSPITAL_COMMUNITY)
Admission: RE | Admit: 2019-06-22 | Discharge: 2019-06-22 | Disposition: A | Discharge status: Home / Self Care | Payer: Medicaid Other | Source: Ambulatory Visit | Attending: Nephrology | Admitting: Nephrology

## 2019-06-22 ENCOUNTER — Other Ambulatory Visit: Payer: Self-pay

## 2019-06-22 ENCOUNTER — Other Ambulatory Visit (HOSPITAL_COMMUNITY): Payer: Self-pay | Admitting: Nephrology

## 2019-06-22 ENCOUNTER — Encounter (HOSPITAL_COMMUNITY): Payer: Self-pay

## 2019-06-22 DIAGNOSIS — F419 Anxiety disorder, unspecified: Secondary | ICD-10-CM | POA: Insufficient documentation

## 2019-06-22 DIAGNOSIS — E1122 Type 2 diabetes mellitus with diabetic chronic kidney disease: Secondary | ICD-10-CM | POA: Diagnosis not present

## 2019-06-22 DIAGNOSIS — T82868A Thrombosis of vascular prosthetic devices, implants and grafts, initial encounter: Secondary | ICD-10-CM | POA: Diagnosis present

## 2019-06-22 DIAGNOSIS — Z8674 Personal history of sudden cardiac arrest: Secondary | ICD-10-CM | POA: Diagnosis not present

## 2019-06-22 DIAGNOSIS — F4310 Post-traumatic stress disorder, unspecified: Secondary | ICD-10-CM | POA: Diagnosis not present

## 2019-06-22 DIAGNOSIS — Y841 Kidney dialysis as the cause of abnormal reaction of the patient, or of later complication, without mention of misadventure at the time of the procedure: Secondary | ICD-10-CM | POA: Insufficient documentation

## 2019-06-22 DIAGNOSIS — Z87891 Personal history of nicotine dependence: Secondary | ICD-10-CM | POA: Diagnosis not present

## 2019-06-22 DIAGNOSIS — F329 Major depressive disorder, single episode, unspecified: Secondary | ICD-10-CM | POA: Diagnosis not present

## 2019-06-22 DIAGNOSIS — E1151 Type 2 diabetes mellitus with diabetic peripheral angiopathy without gangrene: Secondary | ICD-10-CM | POA: Insufficient documentation

## 2019-06-22 DIAGNOSIS — Z992 Dependence on renal dialysis: Secondary | ICD-10-CM | POA: Diagnosis not present

## 2019-06-22 DIAGNOSIS — I509 Heart failure, unspecified: Secondary | ICD-10-CM | POA: Diagnosis not present

## 2019-06-22 DIAGNOSIS — Z79899 Other long term (current) drug therapy: Secondary | ICD-10-CM | POA: Diagnosis not present

## 2019-06-22 DIAGNOSIS — E1140 Type 2 diabetes mellitus with diabetic neuropathy, unspecified: Secondary | ICD-10-CM | POA: Insufficient documentation

## 2019-06-22 DIAGNOSIS — Z7982 Long term (current) use of aspirin: Secondary | ICD-10-CM | POA: Insufficient documentation

## 2019-06-22 DIAGNOSIS — E785 Hyperlipidemia, unspecified: Secondary | ICD-10-CM | POA: Insufficient documentation

## 2019-06-22 DIAGNOSIS — D649 Anemia, unspecified: Secondary | ICD-10-CM | POA: Insufficient documentation

## 2019-06-22 DIAGNOSIS — N186 End stage renal disease: Secondary | ICD-10-CM | POA: Diagnosis not present

## 2019-06-22 HISTORY — PX: IR US GUIDE VASC ACCESS LEFT: IMG2389

## 2019-06-22 HISTORY — PX: IR THROMBECTOMY AV FISTULA W/THROMBOLYSIS/PTA INC/SHUNT/IMG LEFT: IMG6106

## 2019-06-22 LAB — POCT I-STAT, CHEM 8
BUN: 34 mg/dL — ABNORMAL HIGH (ref 6–20)
Calcium, Ion: 0.89 mmol/L — CL (ref 1.15–1.40)
Chloride: 102 mmol/L (ref 98–111)
Creatinine, Ser: 10.5 mg/dL — ABNORMAL HIGH (ref 0.61–1.24)
Glucose, Bld: 70 mg/dL (ref 70–99)
HCT: 35 % — ABNORMAL LOW (ref 39.0–52.0)
Hemoglobin: 11.9 g/dL — ABNORMAL LOW (ref 13.0–17.0)
Potassium: 4.5 mmol/L (ref 3.5–5.1)
Sodium: 139 mmol/L (ref 135–145)
TCO2: 28 mmol/L (ref 22–32)

## 2019-06-22 IMAGING — XA IR THROMBECTOMY AV FISTULA W/THROMBOLYSIS/PTA INC/SHUNT/IMG*L*
11 of 24 series · 11 of 24 positions shown · non-contrast
Comparison: Image guided left upper arm dialysis graft
declot-[DATE]

INDICATION: Clotted graft. Patient previously underwent image guided declot
procedure on [DATE].
TECHNIQUE: Informed written consent was obtained from the patient after a
discussion of the risks, benefits and alternatives to treatment.
Questions regarding the procedure were encouraged and answered. A
timeout was performed prior to the initiation of the procedure.

[Series 1: body 4 care · 1 of 12 slices shown (1 of 2)]
[im 1/12]
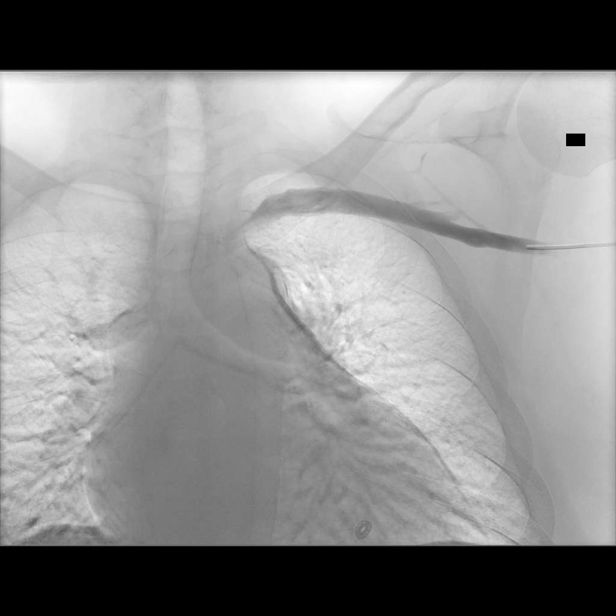

[Series 3: fl (-) angio · 1 of 1 slices shown (1 of 9)]
[im 1/1]
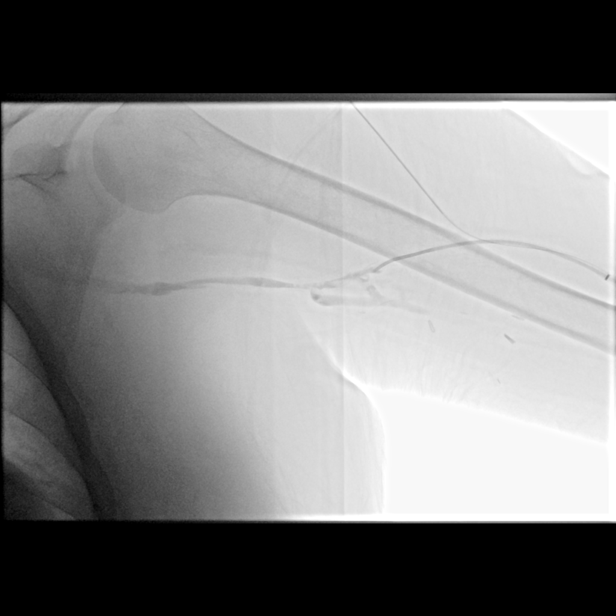

[Series 5: fl (-) angio · 1 of 1 slices shown (2 of 9)]
[im 1/1]
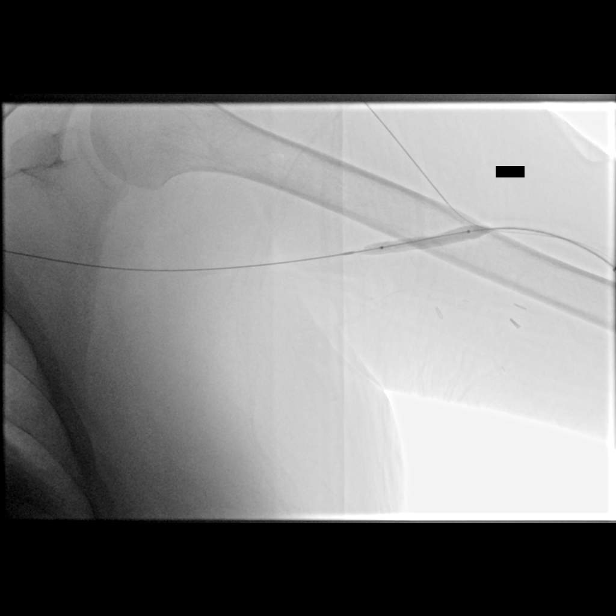

[Series 7: fl (-) angio · 1 of 1 slices shown (3 of 9)]
[im 1/1]
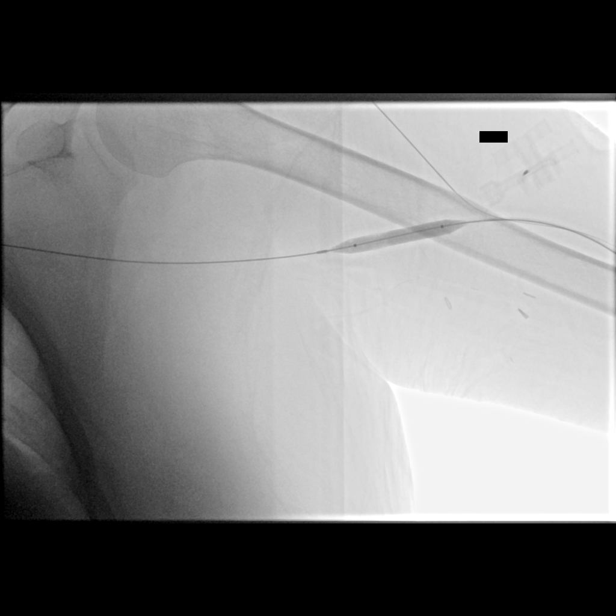

[Series 9: fl (-) angio · 1 of 1 slices shown (4 of 9)]
[im 1/1]
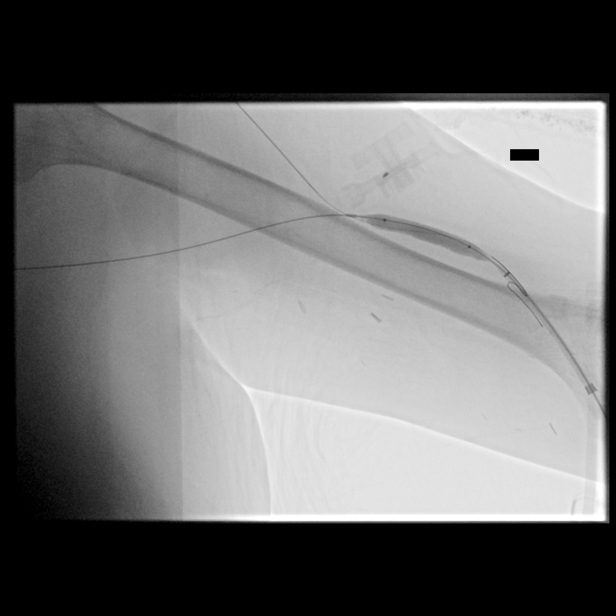

[Series 12: fl (-) angio · 1 of 1 slices shown (5 of 9)]
[im 1/1]
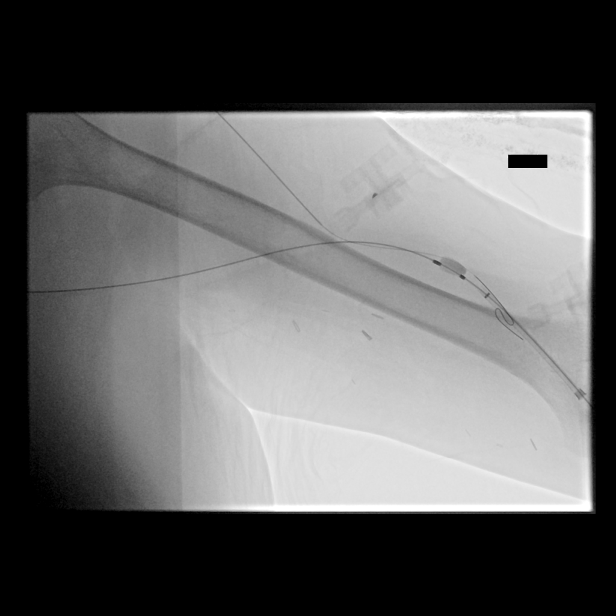

[Series 14: fl (-) angio · 1 of 1 slices shown (6 of 9)]
[im 1/1]
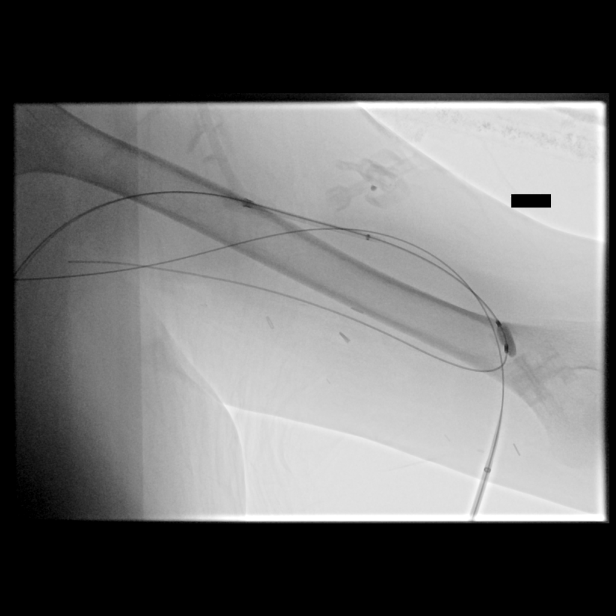

[Series 17: body 4 care · 1 of 9 slices shown (2 of 2)]
[im 1/9]
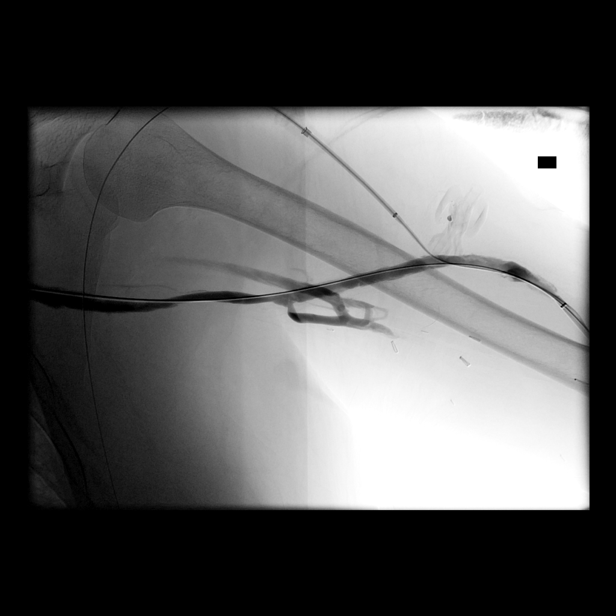

[Series 19: fl (-) angio · 1 of 1 slices shown (7 of 9)]
[im 1/1]
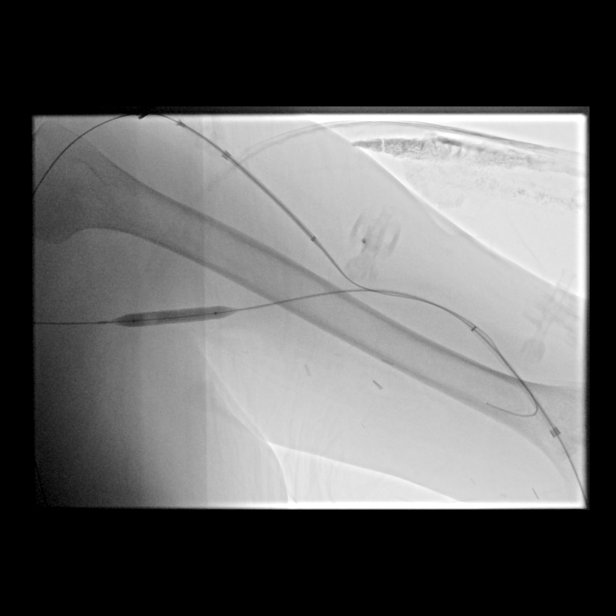

[Series 21: fl (-) angio · 1 of 1 slices shown (8 of 9)]
[im 1/1]
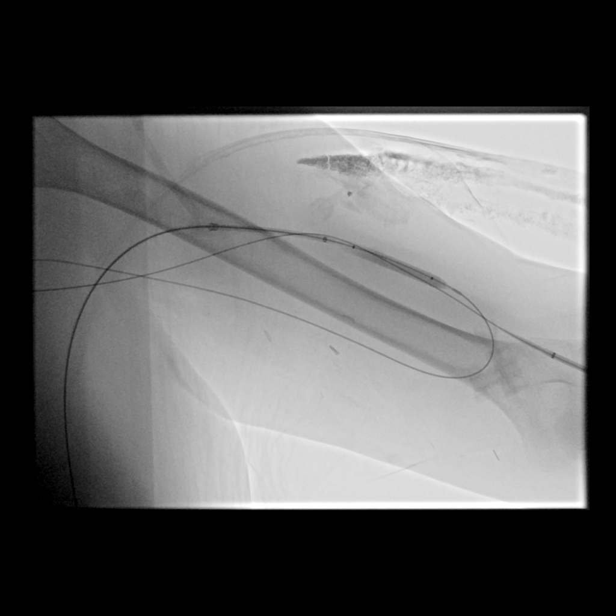

[Series 23: fl (-) angio · 1 of 1 slices shown (9 of 9)]
[im 1/1]
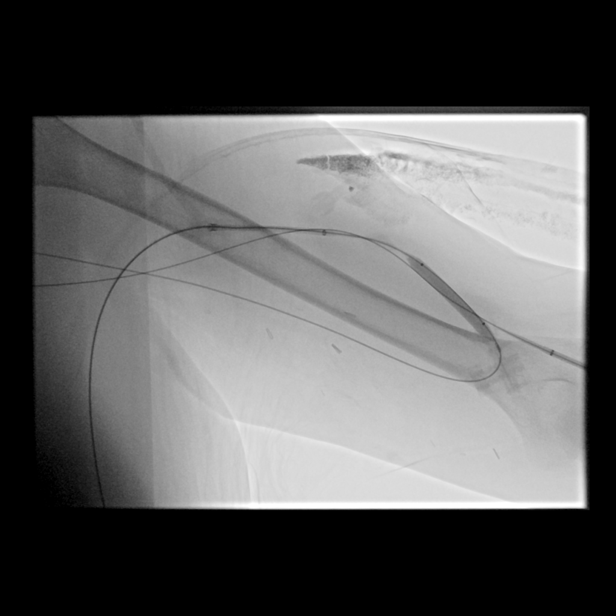

[11 of 24 positions shown; findings below may reference images not displayed]

Patient was able to successfully undergo dialysis this past
[REDACTED] however when he presented yesterday the graft was noted to
be thrombosed. As such she presents today for attempted dialysis
graft thrombolysis.

EXAM:
1. FISTULALYSIS
2. ANGIOPLASTY OF VENOUS LIMB AND VENOUS ANASTOMOSIS
3. ULTRASOUND GUIDANCE FOR VENOUS ACCESS
MEDICATIONS:
Heparin [J8] units IV; TPA 4 mg into graft.

CONTRAST:  30 cc Omnipaque 300

ANESTHESIA/SEDATION:
Moderate (conscious) sedation was employed during this procedure. A
total of Versed 3 mg and Fentanyl 150 mcg was administered
intravenously.

Moderate Sedation Time: 60 minutes. The patient's level of
consciousness and vital signs were monitored continuously by
radiology nursing throughout the procedure under my direct
supervision.

FLUOROSCOPY TIME:  8 minutes, 36 seconds (48 mGy)

COMPLICATIONS:
None immediate.
On physical examination, the existing left arm dialysis graft was
negative for palpable pulse or thrill. The skin overlying the graft
was prepped and draped in the usual sterile fashion, and a sterile
drape was applied covering the operative field. Maximum barrier
sterile technique with sterile gowns and gloves were used for the
procedure.

Under ultrasound guidance, the dialysis graft was accessed directed
towards the venous anastomosis with a micropuncture kit after the
overlying soft tissues were anesthetized with 1% lidocaine. An
ultrasound image was saved for documentation purposes. The
micropuncture sheath was exchange for a 7-French vascular sheath
over a guidewire. Over a Benson wire, a Kumpe catheter was advanced
centrally and a central venogram was performed. Pull back venogram
was performed with the Kumpe catheter. Heparin was administered
systemically and TPA was administered via the Kumpe catheter
throughout near the entirety of the venous limb.

The venous anastomosis and majority of the venous limb was
angioplastied with a 7 mm x 4 cm Conquest balloon. An additional
access was obtained directed towards the arterial anastomoses with a
micropuncture kit after the overlying soft tissues anesthetized with
1% lidocaine. This allowed for placement of a 6-French vascular
sheath. The graft was thrombectomized with several rounds of
push-pull mechanical thrombectomy with an occlusion balloon. Flow
was restored to the graft as evidenced by restored pulsatility of
the graft and brisk blood return from the side arm of the vascular
sheath. Shuntograms were performed.

The arterial limb was then sequentially balloon angioplastied at
multiple stations with a 6 mm x 4 cm Conquest balloon. Post
angioplasty shuntogram images were obtained from the native arterial
system.

Next, the venous stick zone of the dialysis graft was angioplastied
with a 7 mm x 4 cm Conquest balloon. Completion shuntogram images
were obtained.

Images were reviewed and the procedure was terminated. All wires,
catheters and sheaths were removed from the patient. Hemostasis was
achieved at both access sites with deployment of a swizzle sutures
which will be removed at the patient's next dialysis session.
Dressings were applied. The patient tolerated the procedure without
immediate postprocedural complication.
FINDINGS: The existing left upper arm brachial-basilic AV graft is thrombosed.
The venous anastomosis and near the entirety of the venous limb was
angioplastied to 7 mm diameter.

The graft was successfully thrombectomized using mechanical and
pharmacologic means as above.

Tandem areas of nonocclusive mural thrombus/stenosis within the
arterial limb was successfully treated with balloon angioplasty to 6
mm in diameter. Completion shuntogram image performed from the
arterial limb demonstrates focal mild (approximately 25%) luminal
narrowing involving the perianastomotic segment of the arterial
limb, similar to the [DATE] examination.

Mural thrombus within the venous stick zone of the dialysis graft
was treated again with balloon maceration to 7 mm diameter.

Completion shuntogram images demonstrate wide patency of the venous
limb and anastomosis. Note is made of a small aneurysm at the
location of the venous stick zone with minimal amount of
nonocclusive mural thrombus.

The remainder of the left upper extremity venous system is widely
patent to the level of the superior aspect the right atrium.

Completion shuntogram demonstrates the venous limb and anastomosis
are widely patent. The central venous system, arterial limb and
arterial anastomosis are widely patent.
IMPRESSION: 1. Technically successful left upper arm brachial-basilic AV graft
thrombolysis.
2. Successful angioplasty of the venous anastomosis and majority of
the venous limb to 7 mm diameter. Note is made of a small aneurysm
within the venous stick zone of the dialysis graft which contains a
very small amount of mural thrombus. Completion shuntogram
demonstrates otherwise wide patency of the venous limb and
anastomosis.
3. Minimal amount of adherent mural thrombus at the perianastomotic
segment of the arterial limb, similar to the [DATE] examination,
and not resulting in a hemodynamically significant stenosis. The
arterial anastomosis appears widely patent.
4. The central venous system is widely patent.

ACCESS:
This graft would be amenable to future percutaneous intervention as
clinically indicated.

## 2019-06-22 MED ORDER — ALTEPLASE 2 MG IJ SOLR
INTRAMUSCULAR | Status: AC | PRN
  Administered 2019-06-22: 4 mg

## 2019-06-22 MED ORDER — FENTANYL CITRATE (PF) 100 MCG/2ML IJ SOLN
INTRAMUSCULAR | Status: AC | PRN
  Administered 2019-06-22 (×3): 50 ug via INTRAVENOUS

## 2019-06-22 MED ORDER — ALTEPLASE 2 MG IJ SOLR
INTRAMUSCULAR | Status: AC
  Filled 2019-06-22: qty 2

## 2019-06-22 MED ORDER — HEPARIN SODIUM (PORCINE) 1000 UNIT/ML IJ SOLN
INTRAMUSCULAR | Status: AC | PRN
  Administered 2019-06-22: 3000 [IU] via INTRAVENOUS

## 2019-06-22 MED ORDER — LIDOCAINE HCL 1 % IJ SOLN
INTRAMUSCULAR | Status: AC
  Filled 2019-06-22: qty 20

## 2019-06-22 MED ORDER — LIDOCAINE HCL 1 % IJ SOLN
INTRAMUSCULAR | Status: AC | PRN
  Administered 2019-06-22: 5 mL

## 2019-06-22 MED ORDER — IOHEXOL 300 MG/ML  SOLN
100.0000 mL | Freq: Once | INTRAMUSCULAR | Status: AC | PRN
  Administered 2019-06-22: 30 mL via INTRAVENOUS

## 2019-06-22 MED ORDER — FENTANYL CITRATE (PF) 100 MCG/2ML IJ SOLN
INTRAMUSCULAR | Status: AC
  Filled 2019-06-22: qty 4

## 2019-06-22 MED ORDER — MIDAZOLAM HCL 2 MG/2ML IJ SOLN
INTRAMUSCULAR | Status: AC | PRN
  Administered 2019-06-22 (×3): 1 mg via INTRAVENOUS

## 2019-06-22 MED ORDER — HEPARIN SODIUM (PORCINE) 1000 UNIT/ML IJ SOLN
INTRAMUSCULAR | Status: AC
  Filled 2019-06-22: qty 1

## 2019-06-22 MED ORDER — MIDAZOLAM HCL 2 MG/2ML IJ SOLN
INTRAMUSCULAR | Status: AC
  Filled 2019-06-22: qty 6

## 2019-06-22 NOTE — H&P (Signed)
 Chief Complaint: Patient was seen in consultation today for renal failure, clotted AVG  Referring Physician(s): Befekadu,Belayenh  Supervising Physician: Watts, John  Patient Status: [facility] - Out-pt  History of Present Illness: Brendan Campbell is a 49 y.o. male with past medical history of CHF, DM, PVD, PTSD, and end stage renal disease on HD via left upper arm AVG who presents to Radiology today with clotted access.  Patient reports he hasn't been able to undergo dialysis since yesterday.  His last successful session was Wednesday. He reports cardiac arrest approximately 2 weeks ago after missing a session of dialysis and eating poorly.  He has recovered and is feeling well today.  He has been NPO.  He does not take blood thinners.   Past Medical History:  Diagnosis Date  . Anemia   . Anxiety   . Blood transfusion without reported diagnosis   . CHF (congestive heart failure) (HCC)   . Chronic kidney disease   . Depression   . Diabetes mellitus without complication (HCC)   . End stage renal disease (HCC)    M/W/F [facility]  . Hyperlipidemia   . Neuropathy   . Peripheral vascular disease (HCC)   . PTSD (post-traumatic stress disorder)     Past Surgical History:  Procedure Laterality Date  . A/V FISTULAGRAM N/A 10/09/2018   Procedure: A/V FISTULAGRAM;  Surgeon: Brabham, Vance W, MD;  Location: [facility];  Service: Cardiovascular;  Laterality: N/A;  . AV FISTULA PLACEMENT Left 09/22/2016   Procedure: CREATION OF LEFT ARM ARTERIOVENOUS (AV) FISTULA;  Surgeon: Cain, Brandon Christopher, MD;  Location: [facility] OR;  Service: Vascular;  Laterality: Left;  . AV FISTULA PLACEMENT Left 10/31/2017   Procedure: INSERTION OF ARTERIOVENOUS (AV) GORE-TEX 4-7mm STETCH GRAFT LEFT ARM;  Surgeon: Cain, Brandon Christopher, MD;  Location: [facility] OR;  Service: Vascular;  Laterality: Left;  . BASCILIC VEIN TRANSPOSITION Left 08/17/2017   Procedure: SECOND STAGE BASILIC VEIN TRANSPOSITION LEFT ARM;   Surgeon: Cain, Brandon Christopher, MD;  Location: [facility];  Service: Vascular;  Laterality: Left;  . BELOW KNEE LEG AMPUTATION Right   . CARDIOVERSION N/A 02/19/2019   Procedure: CARDIOVERSION;  Surgeon: Hilty, Kenneth C, MD;  Location: [facility];  Service: Cardiovascular;  Laterality: N/A;  . CHOLECYSTECTOMY    . FOOT SURGERY    . IR FLUORO GUIDE CV LINE RIGHT  05/16/2016  . IR FLUORO GUIDE CV LINE RIGHT  02/16/2019  . IR REMOVAL TUN CV CATH W/O FL  05/16/2016  . IR REMOVAL TUN CV CATH W/O FL  02/19/2019  . IR THROMBECTOMY AV FISTULA W/THROMBOLYSIS/PTA INC/SHUNT/IMG LEFT Left 11/09/2018  . IR US GUIDE VASC ACCESS LEFT  11/09/2018  . IR US GUIDE VASC ACCESS RIGHT  05/16/2016  . IR US GUIDE VASC ACCESS RIGHT  02/16/2019  . PERIPHERAL VASCULAR BALLOON ANGIOPLASTY  10/09/2018   Procedure: PERIPHERAL VASCULAR BALLOON ANGIOPLASTY;  Surgeon: Brabham, Vance W, MD;  Location: [facility];  Service: Cardiovascular;;  . TEE WITHOUT CARDIOVERSION N/A 02/19/2019   Procedure: TRANSESOPHAGEAL ECHOCARDIOGRAM (TEE);  Surgeon: Hilty, Kenneth C, MD;  Location: [facility];  Service: Cardiovascular;  Laterality: N/A;    Allergies: Tape  Medications: Prior to Admission medications   Medication Sig Start Date End Date Taking? Authorizing Provider  aspirin 81 MG chewable tablet Chew 81 mg by mouth daily.   Yes [provider]  blood glucose meter kit and supplies KIT Dispense based on patient and insurance preference. Use up to four   times daily as directed. (FOR ICD-9 250.00, 250.01). 06/02/16  Yes Timmothy Euler, MD  multivitamin (RENA-VIT) TABS tablet Take 1 tablet by mouth daily. 08/04/18  Yes [provider]  SURE COMFORT PEN NEEDLES 31G X 5 MM MISC USE AS DIRECTED DAILY. 11/07/16  Yes Timmothy Euler, MD  atorvastatin (LIPITOR) 10 MG tablet Take 1 tablet (10 mg total) by mouth every evening. 06/02/16   Timmothy Euler, MD     Family History  Problem Relation Age of Onset  . Cancer  Mother        lung  . Diabetes Mother   . Heart attack Father   . Diabetes Father   . Diabetes Sister     Social History   Socioeconomic History  . Marital status: Single    Spouse name: Not on file  . Number of children: Not on file  . Years of education: Not on file  . Highest education level: Not on file  Occupational History  . Not on file  Tobacco Use  . Smoking status: Former Smoker    Quit date: 09/03/2006    Years since quitting: 12.8  . Smokeless tobacco: Never Used  Substance and Sexual Activity  . Alcohol use: No  . Drug use: No  . Sexual activity: Not Currently  Other Topics Concern  . Not on file  Social History Narrative   Pt lives in [place] with roommate.  Not followed by an outpatient provider currently, but is trying to schedule an appointment with [facility] Pablo Ledger.   Social Determinants of Health   Financial Resource Strain:   . Difficulty of Paying Living Expenses:   Food Insecurity:   . Worried About Charity fundraiser in the Last Year:   . Arboriculturist in the Last Year:   Transportation Needs:   . Film/video editor (Medical):   [name] Lack of Transportation (Non-Medical):   Physical Activity:   . Days of Exercise per Week:   . Minutes of Exercise per Session:   Stress:   . Feeling of Stress :   Social Connections:   . Frequency of Communication with Friends and Family:   . Frequency of Social Gatherings with Friends and Family:   . Attends Religious Services:   . Active Member of Clubs or Organizations:   . Attends Archivist Meetings:   [name] Marital Status:      Review of Systems: A 12 point ROS discussed and pertinent positives are indicated in the HPI above.  All other systems are negative.  Review of Systems  Constitutional: Negative for fatigue and fever.  Respiratory: Negative for cough and shortness of breath.   Cardiovascular: Negative for chest pain.  Gastrointestinal: Negative for abdominal pain, nausea  and vomiting.  Musculoskeletal: Negative for back pain.  Psychiatric/Behavioral: Negative for behavioral problems and confusion.    Vital Signs: There were no vitals taken for this visit.  Physical Exam Vitals and nursing note reviewed.  Constitutional:      Appearance: Normal appearance.  Cardiovascular:     Rate and Rhythm: Normal rate and regular rhythm.  Pulmonary:     Effort: Pulmonary effort is normal. No respiratory distress.     Breath sounds: Normal breath sounds.  Abdominal:     General: Abdomen is flat.     Palpations: Abdomen is soft.  Musculoskeletal:     Comments: Left upper arm graft without bruit, faint thrill at the distal portion.  Ecchymosis present  Skin:    General: Skin is warm and dry.  Neurological:     General: No focal deficit present.     Mental Status: He is alert and oriented to person, place, and time.  Psychiatric:        Mood and Affect: Mood normal.        Behavior: Behavior normal.        Thought Content: Thought content normal.        Judgment: Judgment normal.      MD Evaluation Airway: WNL Heart: WNL Abdomen: WNL Chest/ Lungs: WNL ASA  Classification: 3 Mallampati/Airway Score: One   Imaging: DG Chest Port 1 View  Result Date: 06/07/2019 CLINICAL DATA:  Weakness and shortness of breath. EXAM: PORTABLE CHEST 1 VIEW COMPARISON:  05/16/2019 and prior radiographs FINDINGS: Cardiomegaly, pulmonary vascular congestion and mild interstitial pulmonary edema noted. No focal airspace disease, consolidation, mass, large pleural effusion or pneumothorax noted. A defibrillator/pacing pad overlying the LEFT chest is noted. No acute bony abnormalities identified. IMPRESSION: Cardiomegaly with pulmonary vascular congestion and mild interstitial pulmonary edema. Electronically Signed   By: Jeffrey  Hu M.D.   On: 06/07/2019 10:10    Labs:  CBC: Recent Labs    05/16/19 0814 06/07/19 1126 06/09/19 0629 06/10/19 0505  WBC 8.8 15.0* 8.0 7.8    HGB 11.6* 12.0* 12.7* 11.9*  HCT 37.0* 36.2* 40.0 36.8*  PLT 293 265 188 177    COAGS: Recent Labs    11/09/18 1057 02/16/19 1437 06/07/19 1125 06/08/19 0520  INR 1.2 1.3* 1.4* 1.3*  APTT  --   --   --  34    BMP: Recent Labs    06/07/19 1941 06/08/19 0520 06/09/19 0629 06/10/19 0505  NA 138 134* 134* 134*  K 7.0* 4.5 4.4 4.6  CL 95* 88* 92* 92*  CO2 26 30 27 25  GLUCOSE 267* 87 68* 72  BUN 59* 22* 47* 64*  CALCIUM 7.3* 8.5* 7.8* 7.3*  CREATININE 9.30* 4.66* 6.93* 8.68*  GFRNONAA 6* 14* 9* 6*  GFRAA 7* 16* 10* 8*    LIVER FUNCTION TESTS: Recent Labs    10/05/18 1603 10/05/18 1603 10/05/18 2022 10/05/18 2022 11/02/18 0701 11/03/18 0709 06/07/19 1125 06/07/19 1125 06/07/19 1551 06/08/19 0520 06/09/19 0629 06/10/19 0505  BILITOT 0.9  --  0.7  --  1.0  --  1.1  --   --   --   --   --   AST 33  --  32  --  23  --  53*  --   --   --   --   --   ALT 38  --  34  --  19  --  39  --   --   --   --   --   ALKPHOS 112  --  103  --  112  --  93  --   --   --   --   --   PROT 7.8  --  7.0  --  7.9  --  6.7  --   --   --   --   --   ALBUMIN 4.0   < > 3.6   < > 3.9   < > 3.6   < > 3.9 4.0 3.5 3.5   < > = values in this interval not displayed.    TUMOR MARKERS: No results for input(s): AFPTM, CEA, CA199, CHROMGRNA in the last 8760 hours.  Assessment and Plan:   Patient with past medical history of renal failure, left AVG presents with complaint of clotted access, inability to get dialysis for 1 week.  IR consulted for declotting procedure.  Patient presents today feeling well after recent acute events.  He has been NPO and is not currently on blood thinners.    Risks and benefits discussed with the patient including, but not limited to bleeding, infection, vascular injury, pulmonary embolism, need for tunneled HD catheter placement or even death.  All of the patient's questions were answered, patient is agreeable to proceed. Consent signed and in  chart.  Thank you for this interesting consult.  I greatly enjoyed meeting Brendan Campbell and look forward to participating in their care.  A copy of this report was sent to the requesting provider on this date.  Electronically Signed: Docia Barrier, PA 06/22/2019, 9:47 AM   I spent a total of    25 Minutes in face to face in clinical consultation, greater than 50% of which was counseling/coordinating care for renal failure, clotted AVG.

## 2019-06-22 NOTE — Sedation Documentation (Signed)
Pt with VS WNL and LUE drsgs CDI. Assisted pt with dressing. Waiting in [facility] for transport to dialysis center. Driver to pick up at the ED entrance.

## 2019-06-22 NOTE — Sedation Documentation (Signed)
Called "[employer], manufacturing systems. Driver will not be able to pick pt up until 12:20.

## 2019-06-22 NOTE — Sedation Documentation (Signed)
Pt taken to nursing station for recovery.

## 2019-06-22 NOTE — Procedures (Signed)
Pre-procedure Diagnosis: ESRD Post-procedure Diagnosis: Same  Technically successful declot of left upper arm dialysis graft.  Complications: None Immediate EBL: Trace  Ronny Bacon, MD Pager #: [phone]

## 2019-08-02 ENCOUNTER — Encounter (HOSPITAL_COMMUNITY): Payer: Self-pay

## 2019-08-02 ENCOUNTER — Observation Stay (HOSPITAL_COMMUNITY): Payer: Medicaid Other

## 2019-08-02 ENCOUNTER — Other Ambulatory Visit: Payer: Self-pay

## 2019-08-02 ENCOUNTER — Emergency Department (HOSPITAL_COMMUNITY): Payer: Medicaid Other

## 2019-08-02 ENCOUNTER — Observation Stay (HOSPITAL_COMMUNITY)
Admission: EM | Admit: 2019-08-02 | Discharge: 2019-08-03 | Disposition: A | Discharge status: Home / Self Care | Payer: Medicaid Other | Attending: Internal Medicine | Admitting: Internal Medicine

## 2019-08-02 DIAGNOSIS — Z992 Dependence on renal dialysis: Secondary | ICD-10-CM | POA: Diagnosis not present

## 2019-08-02 DIAGNOSIS — R531 Weakness: Secondary | ICD-10-CM | POA: Diagnosis present

## 2019-08-02 DIAGNOSIS — I482 Chronic atrial fibrillation, unspecified: Secondary | ICD-10-CM

## 2019-08-02 DIAGNOSIS — Z20822 Contact with and (suspected) exposure to covid-19: Secondary | ICD-10-CM | POA: Insufficient documentation

## 2019-08-02 DIAGNOSIS — E1122 Type 2 diabetes mellitus with diabetic chronic kidney disease: Secondary | ICD-10-CM

## 2019-08-02 DIAGNOSIS — I509 Heart failure, unspecified: Secondary | ICD-10-CM | POA: Diagnosis not present

## 2019-08-02 DIAGNOSIS — N186 End stage renal disease: Secondary | ICD-10-CM

## 2019-08-02 DIAGNOSIS — R202 Paresthesia of skin: Principal | ICD-10-CM | POA: Insufficient documentation

## 2019-08-02 DIAGNOSIS — E213 Hyperparathyroidism, unspecified: Secondary | ICD-10-CM

## 2019-08-02 DIAGNOSIS — R200 Anesthesia of skin: Secondary | ICD-10-CM

## 2019-08-02 DIAGNOSIS — Z794 Long term (current) use of insulin: Secondary | ICD-10-CM | POA: Diagnosis not present

## 2019-08-02 DIAGNOSIS — Z87891 Personal history of nicotine dependence: Secondary | ICD-10-CM | POA: Insufficient documentation

## 2019-08-02 DIAGNOSIS — Z7982 Long term (current) use of aspirin: Secondary | ICD-10-CM | POA: Diagnosis not present

## 2019-08-02 DIAGNOSIS — E1151 Type 2 diabetes mellitus with diabetic peripheral angiopathy without gangrene: Secondary | ICD-10-CM | POA: Insufficient documentation

## 2019-08-02 DIAGNOSIS — E1140 Type 2 diabetes mellitus with diabetic neuropathy, unspecified: Secondary | ICD-10-CM | POA: Diagnosis not present

## 2019-08-02 DIAGNOSIS — I132 Hypertensive heart and chronic kidney disease with heart failure and with stage 5 chronic kidney disease, or end stage renal disease: Secondary | ICD-10-CM | POA: Insufficient documentation

## 2019-08-02 LAB — CBC
HCT: 35.8 % — ABNORMAL LOW (ref 39.0–52.0)
Hemoglobin: 11.2 g/dL — ABNORMAL LOW (ref 13.0–17.0)
MCH: 33.5 pg (ref 26.0–34.0)
MCHC: 31.3 g/dL (ref 30.0–36.0)
MCV: 107.2 fL — ABNORMAL HIGH (ref 80.0–100.0)
Platelets: 221 10*3/uL (ref 150–400)
RBC: 3.34 MIL/uL — ABNORMAL LOW (ref 4.22–5.81)
RDW: 13.1 % (ref 11.5–15.5)
WBC: 7.1 10*3/uL (ref 4.0–10.5)
nRBC: 0 % (ref 0.0–0.2)

## 2019-08-02 LAB — APTT: aPTT: 38 seconds — ABNORMAL HIGH (ref 24–36)

## 2019-08-02 LAB — DIFFERENTIAL
Abs Immature Granulocytes: 0.02 10*3/uL (ref 0.00–0.07)
Basophils Absolute: 0.1 10*3/uL (ref 0.0–0.1)
Basophils Relative: 1 %
Eosinophils Absolute: 0.3 10*3/uL (ref 0.0–0.5)
Eosinophils Relative: 4 %
Immature Granulocytes: 0 %
Lymphocytes Relative: 25 %
Lymphs Abs: 1.8 10*3/uL (ref 0.7–4.0)
Monocytes Absolute: 0.6 10*3/uL (ref 0.1–1.0)
Monocytes Relative: 8 %
Neutro Abs: 4.4 10*3/uL (ref 1.7–7.7)
Neutrophils Relative %: 62 %

## 2019-08-02 LAB — TSH: TSH: 1.96 u[IU]/mL (ref 0.350–4.500)

## 2019-08-02 LAB — C-REACTIVE PROTEIN: CRP: 0.6 mg/dL (ref <1.0)

## 2019-08-02 LAB — GLUCOSE, CAPILLARY
Glucose-Capillary: 131 mg/dL — ABNORMAL HIGH (ref 70–99)
Glucose-Capillary: 94 mg/dL (ref 70–99)

## 2019-08-02 LAB — COMPREHENSIVE METABOLIC PANEL
ALT: 14 U/L (ref 0–44)
AST: 15 U/L (ref 15–41)
Albumin: 3.8 g/dL (ref 3.5–5.0)
Alkaline Phosphatase: 104 U/L (ref 38–126)
Anion gap: 16 — ABNORMAL HIGH (ref 5–15)
BUN: 39 mg/dL — ABNORMAL HIGH (ref 6–20)
CO2: 27 mmol/L (ref 22–32)
Calcium: 6.5 mg/dL — ABNORMAL LOW (ref 8.9–10.3)
Chloride: 96 mmol/L — ABNORMAL LOW (ref 98–111)
Creatinine, Ser: 9.93 mg/dL — ABNORMAL HIGH (ref 0.61–1.24)
GFR calc Af Amer: 6 mL/min — ABNORMAL LOW (ref >60)
GFR calc non Af Amer: 6 mL/min — ABNORMAL LOW (ref >60)
Glucose, Bld: 84 mg/dL (ref 70–99)
Potassium: 4.4 mmol/L (ref 3.5–5.1)
Sodium: 139 mmol/L (ref 135–145)
Total Bilirubin: 0.3 mg/dL (ref 0.3–1.2)
Total Protein: 7.1 g/dL (ref 6.5–8.1)

## 2019-08-02 LAB — TROPONIN I (HIGH SENSITIVITY)
Troponin I (High Sensitivity): 25 ng/L — ABNORMAL HIGH (ref <18)
Troponin I (High Sensitivity): 26 ng/L — ABNORMAL HIGH (ref <18)

## 2019-08-02 LAB — HEMOGLOBIN A1C
Hgb A1c MFr Bld: 5.4 % (ref 4.8–5.6)
Mean Plasma Glucose: 108.28 mg/dL

## 2019-08-02 LAB — LACTIC ACID, PLASMA
Lactic Acid, Venous: 0.9 mmol/L (ref 0.5–1.9)
Lactic Acid, Venous: 1 mmol/L (ref 0.5–1.9)

## 2019-08-02 LAB — ETHANOL: Alcohol, Ethyl (B): <10 mg/dL (ref <10)

## 2019-08-02 LAB — CBG MONITORING, ED: Glucose-Capillary: 74 mg/dL (ref 70–99)

## 2019-08-02 LAB — PROTIME-INR
INR: 1.1 (ref 0.8–1.2)
Prothrombin Time: 13.3 seconds (ref 11.4–15.2)

## 2019-08-02 LAB — SEDIMENTATION RATE: Sed Rate: 34 mm/hr — ABNORMAL HIGH (ref 0–16)

## 2019-08-02 LAB — VITAMIN B12: Vitamin B-12: 425 pg/mL (ref 180–914)

## 2019-08-02 LAB — SARS CORONAVIRUS 2 BY RT PCR (HOSPITAL ORDER, PERFORMED IN ~~LOC~~ HOSPITAL LAB): SARS Coronavirus 2: NEGATIVE

## 2019-08-02 IMAGING — DX DG CHEST 1V PORT
1 series · 1 of 1 positions shown · non-contrast
Comparison: One-view chest x-ray [DATE]

CLINICAL DATA: Chest pain.  Missed dialysis.

EXAM:
PORTABLE CHEST 1 VIEW

[chest ap]
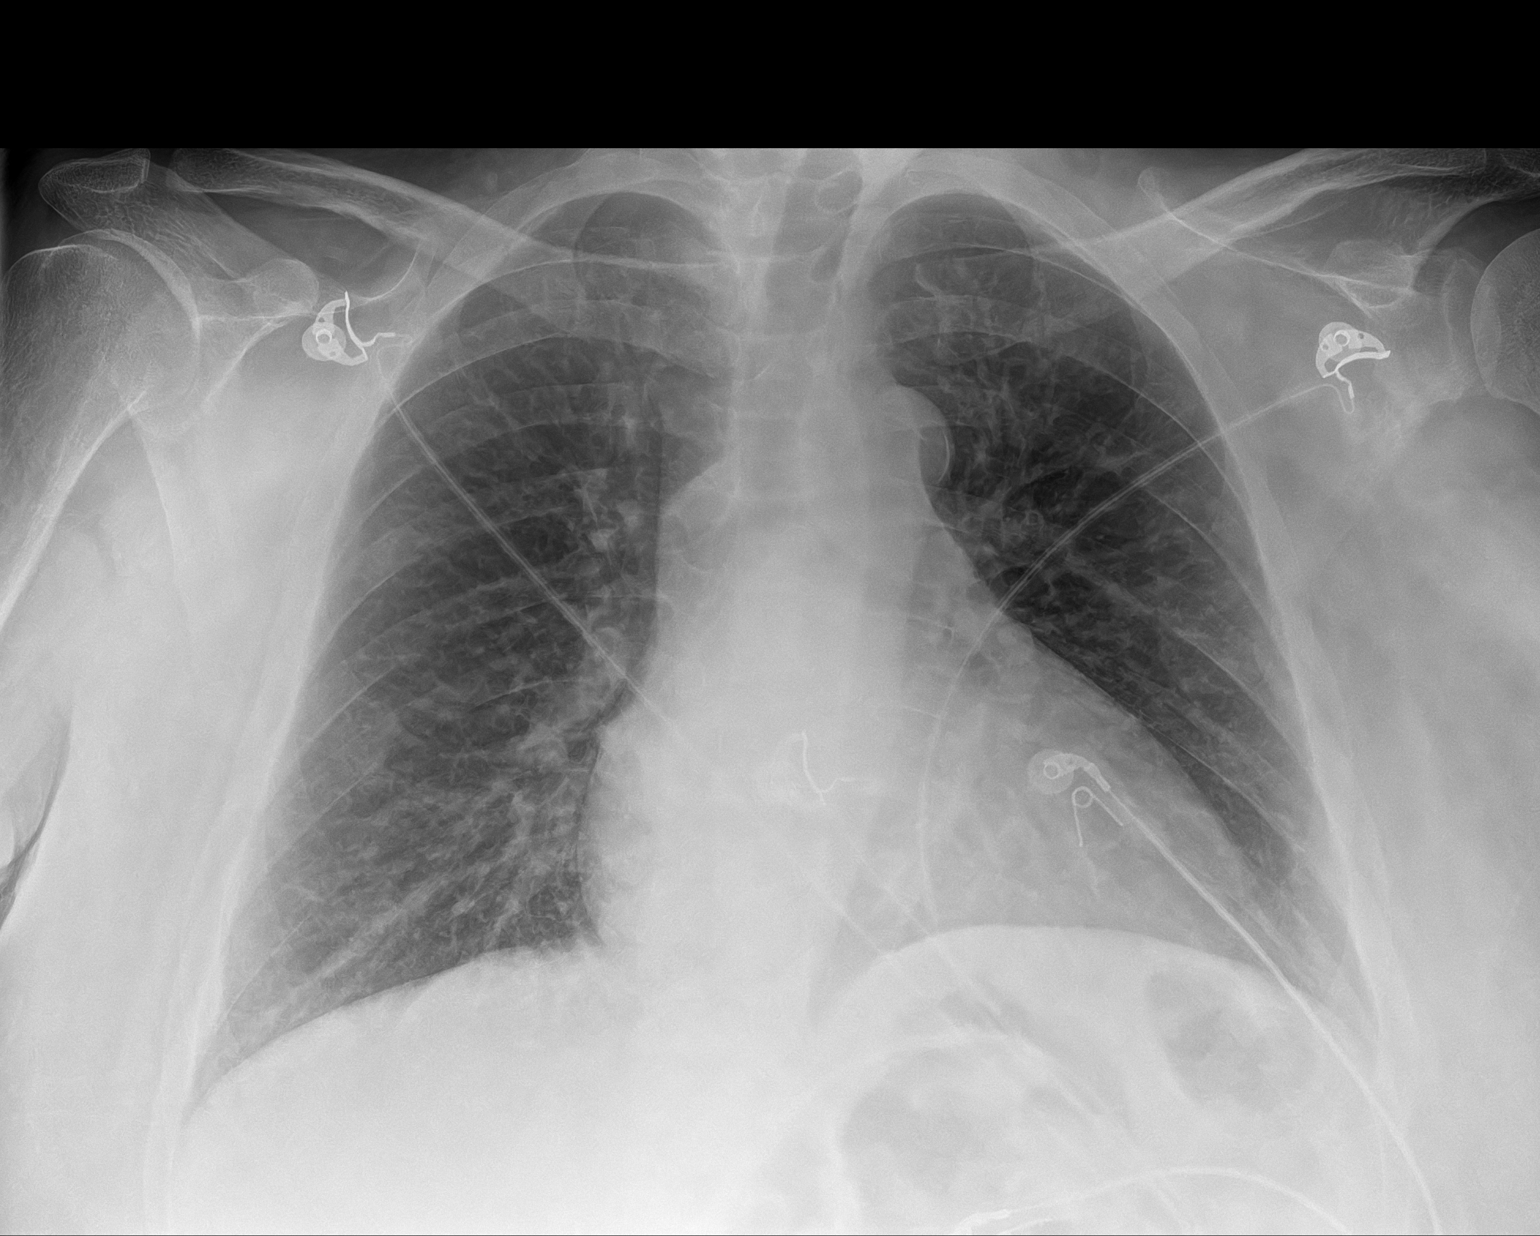

[1 of 1 positions shown; findings below may reference images not displayed]

FINDINGS: The heart size is mildly enlarged. Atherosclerotic changes are noted
at the aortic arch. The lungs lungs are clear. No edema or effusion
is present. No focal airspace consolidation is present.
IMPRESSION: Mild cardiomegaly without failure.  Flu

## 2019-08-02 IMAGING — MR MR HEAD W/O CM
8 of 10 series · 36 of 48 positions shown · non-contrast
Comparison: Head CT [DATE], head CT [DATE]

CLINICAL DATA: Focal neuro deficit, greater than 6 hours, stroke
suspected. Additional provided: Patient reports left arm, leg and
face numbness which began on [REDACTED].

EXAM:
MRI HEAD WITHOUT CONTRAST
TECHNIQUE: Multiplanar, multiecho pulse sequences of the brain and surrounding
structures were obtained without intravenous contrast.

[Series 2: T1 · sagittal · 5.0mm · 0.41mm/px · 3 of 20 slices shown (1 of 2)]
[im 1/20]
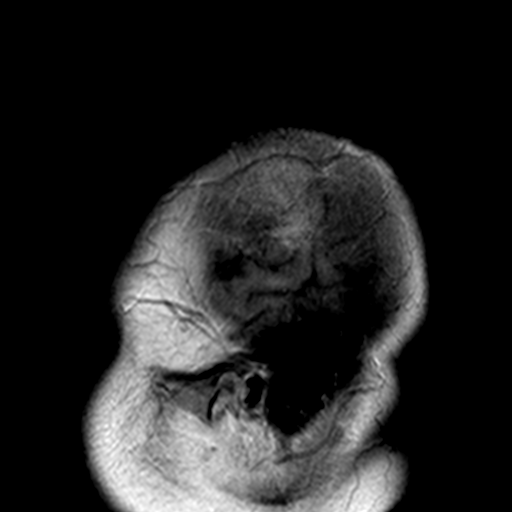
[im 10/20]
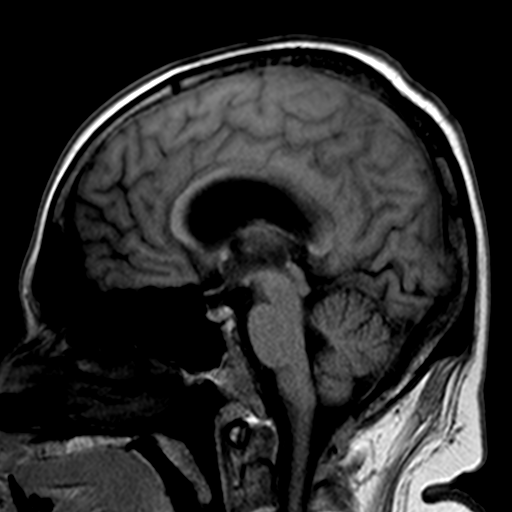
[im 20/20]
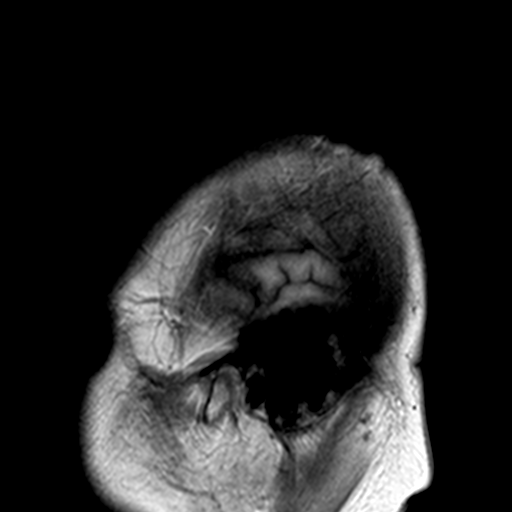

[Series 3: DWI · axial · 3.0mm · 0.75mm/px · z∈[-21,+140]mm · 11 of 110 slices shown (1 of 2)]
[im 1/110]
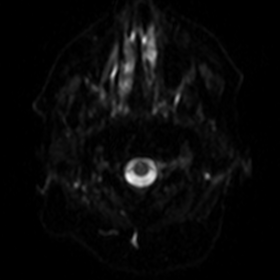
[im 11/110]
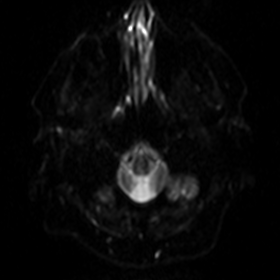
[im 22/110]
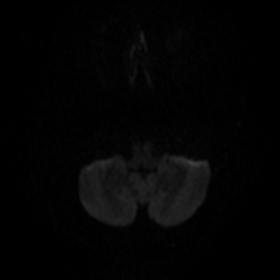
[im 33/110]
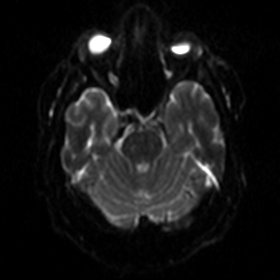
[im 44/110]
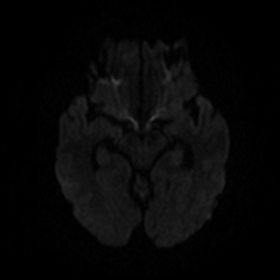
[im 55/110]
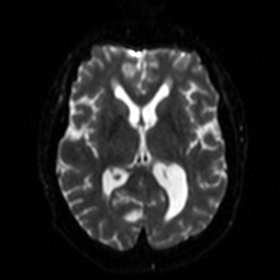
[im 66/110]
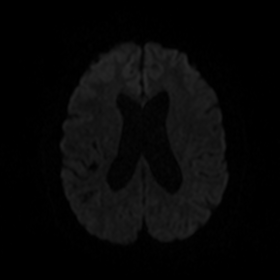
[im 77/110]
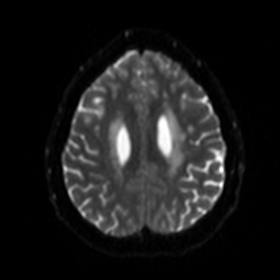
[im 88/110]
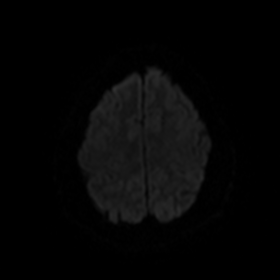
[im 99/110]
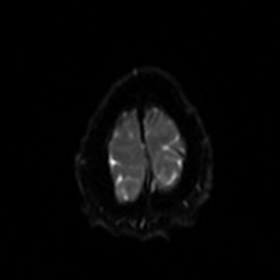
[im 110/110]
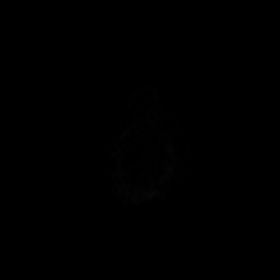

[Series 5: DWI · coronal · 5.0mm · 0.52mm/px · 6 of 68 slices shown (2 of 2)]
[im 1/68]
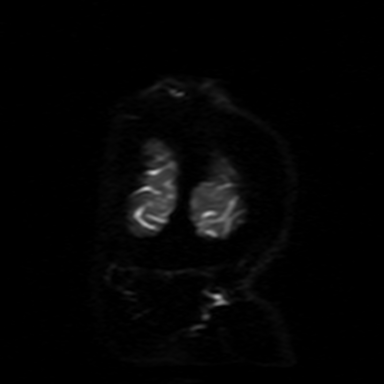
[im 14/68]
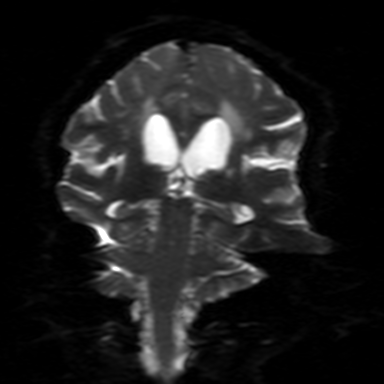
[im 27/68]
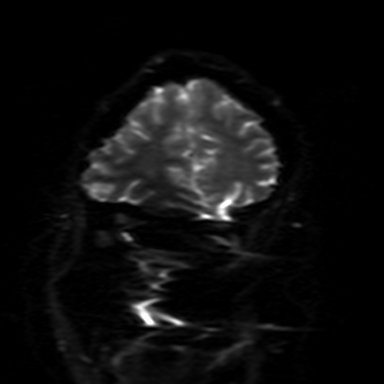
[im 41/68]
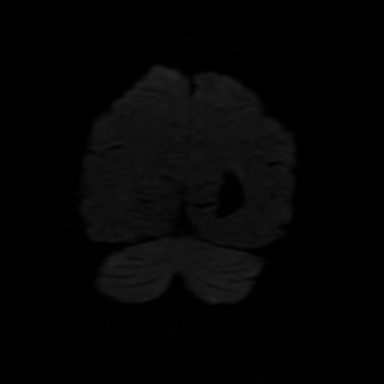
[im 54/68]
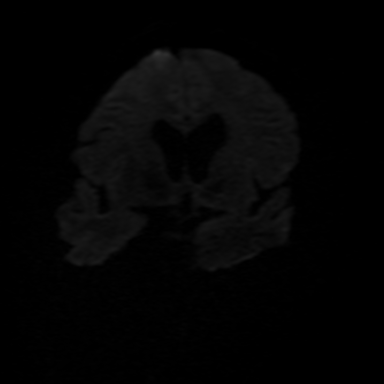
[im 68/68]
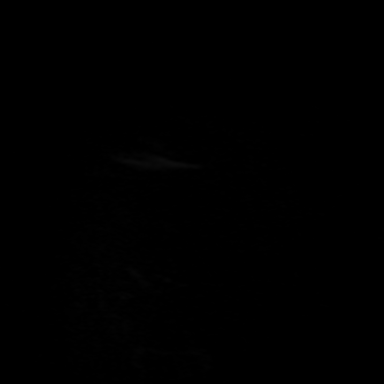

[Series 7: T2 · axial · 5.0mm · 0.64mm/px · z∈[-11,+131]mm · 2 of 23 slices shown (1 of 3)]
[im 1/23]
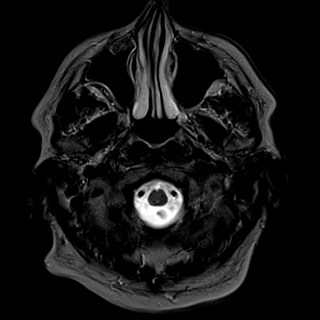
[im 23/23]
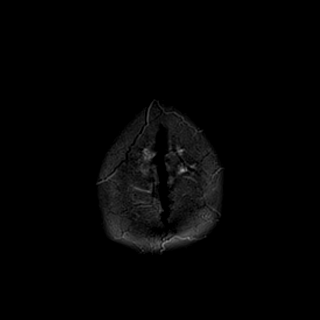

[Series 8: FLAIR · axial · 3.0mm · 0.80mm/px · z∈[-9,+128]mm · 4 of 47 slices shown]
[im 1/47]
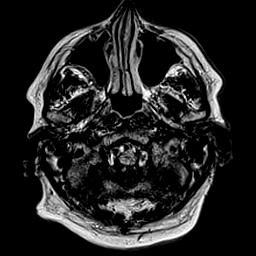
[im 16/47]
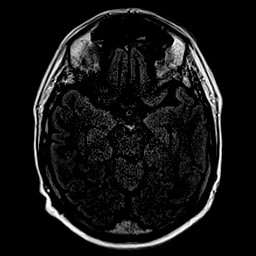
[im 31/47]
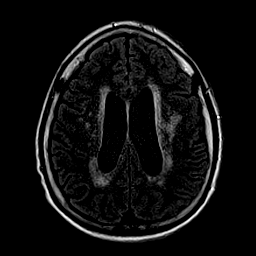
[im 47/47]
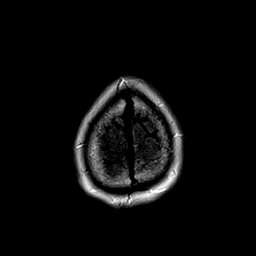

[Series 9: T2 · axial · 5.0mm · 0.41mm/px · z∈[-5,+124]mm · 2 of 21 slices shown (2 of 3)]
[im 1/21]
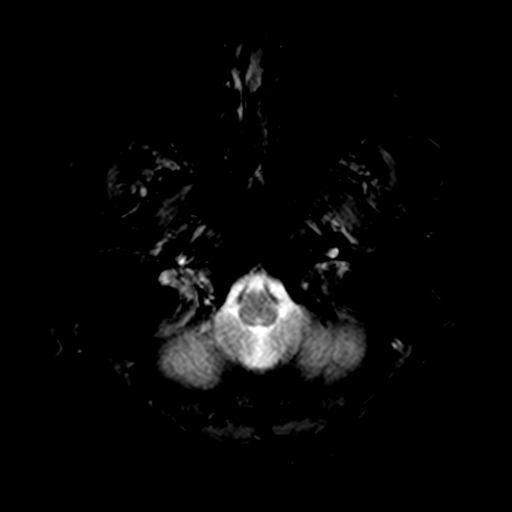
[im 21/21]
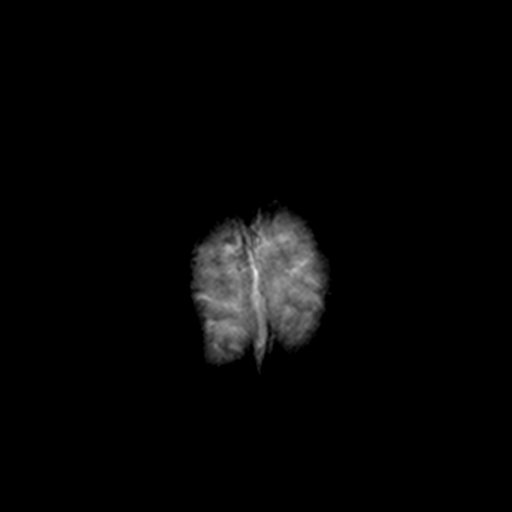

[Series 10: T1 · axial · 2.0mm · 0.41mm/px · z∈[-33,+88]mm · 5 of 99 slices shown (2 of 2)]
[im 1/99]
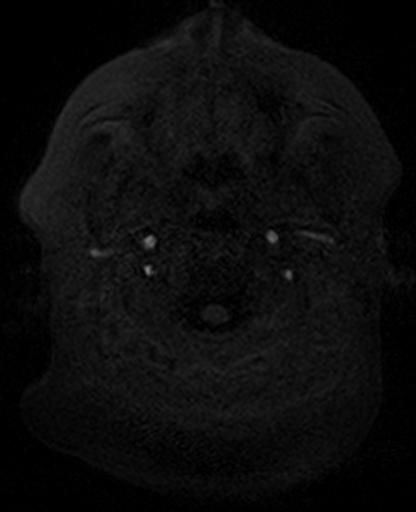
[im 13/99]
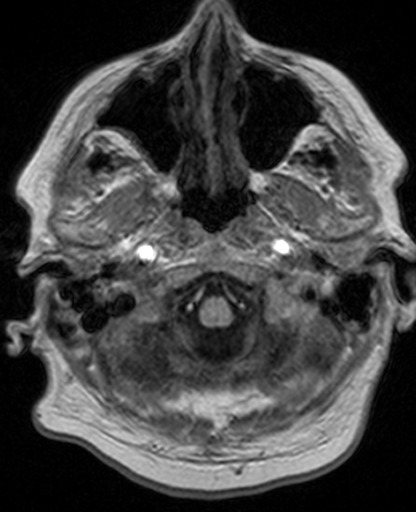
[im 25/99]
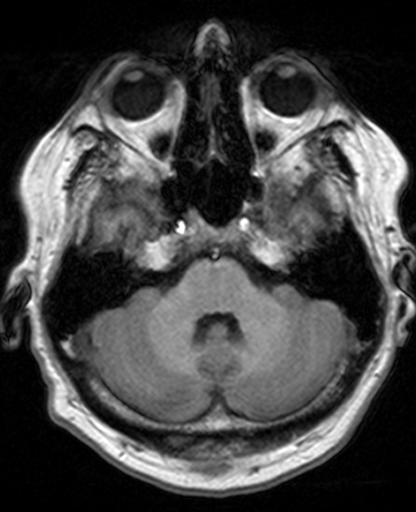
[im 37/99]
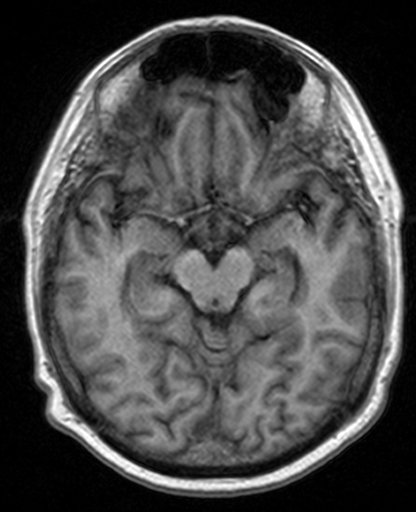
[im 62/99]
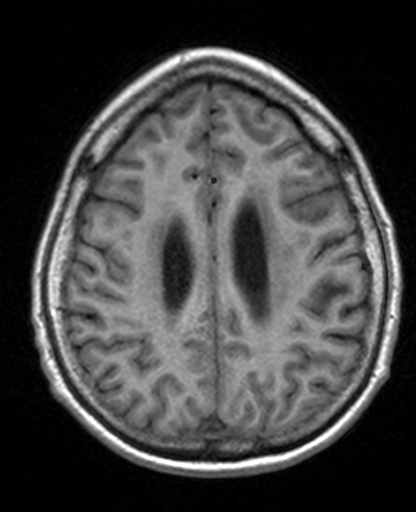

[Series 11: T2 · coronal · 5.0mm · 0.58mm/px · 3 of 28 slices shown (3 of 3)]
[im 1/28]
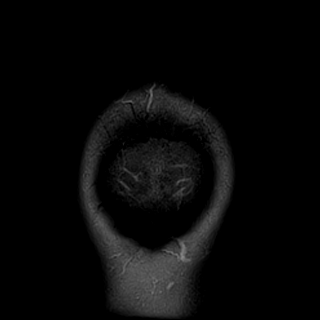
[im 14/28]
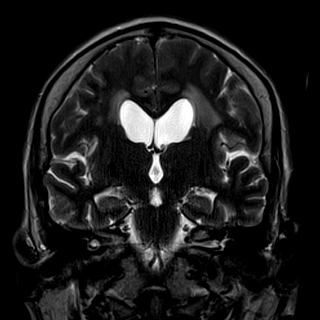
[im 28/28]
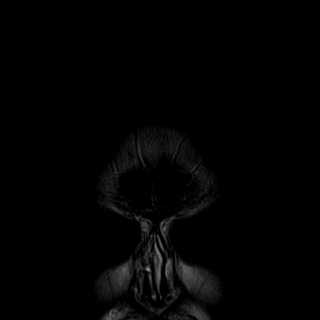

[36 of 48 positions shown; findings below may reference images not displayed]

FINDINGS: Brain:

Cerebral volume is normal for age.

Patchy T2/FLAIR hyperintensity within the cerebral white matter is
advanced for age and nonspecific, but likely reflect sequela of
chronic small vessel ischemic disease given the history of diabetes,
hyperlipidemia and peripheral vascular disease.

There is no acute infarct.

No evidence of intracranial mass.

No chronic intracranial blood products.

No extra-axial fluid collection.

No midline shift.

Vascular: Expected proximal arterial flow voids.

Skull and upper cervical spine: No focal marrow lesion.

Sinuses/Orbits: Visualized orbits show no acute finding. Trace
ethmoid and maxillary sinus mucosal thickening. No significant
mastoid effusion.
IMPRESSION: No evidence of acute intracranial abnormality.

Patchy T2 hyperintense signal changes in the cerebral white matter
are advanced for age and nonspecific, but likely reflect sequela of
chronic small vessel ischemic disease given the history of diabetes,
hyperlipidemia and peripheral vascular disease.

## 2019-08-02 IMAGING — US US CAROTID DUPLEX BILAT
1 series · 13 of 24 positions shown · non-contrast
Comparison: None.

CLINICAL DATA: Left-sided numbness, hyperlipidemia

EXAM:
BILATERAL CAROTID DUPLEX ULTRASOUND
TECHNIQUE: Gray scale imaging, color Doppler and duplex ultrasound were
performed of bilateral carotid and vertebral arteries in the neck.

[Series 1: us carotid duplex bilat · 0.06mm/px · 13 of 96 slices shown]
[im 1/96]
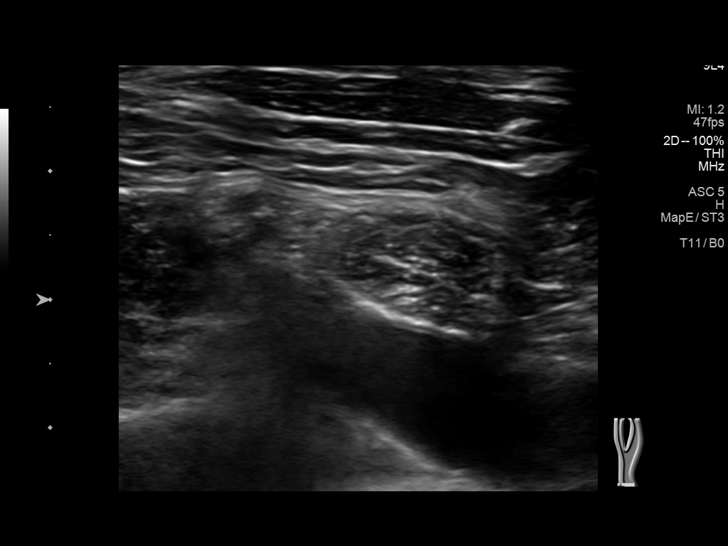
[im 9/96]
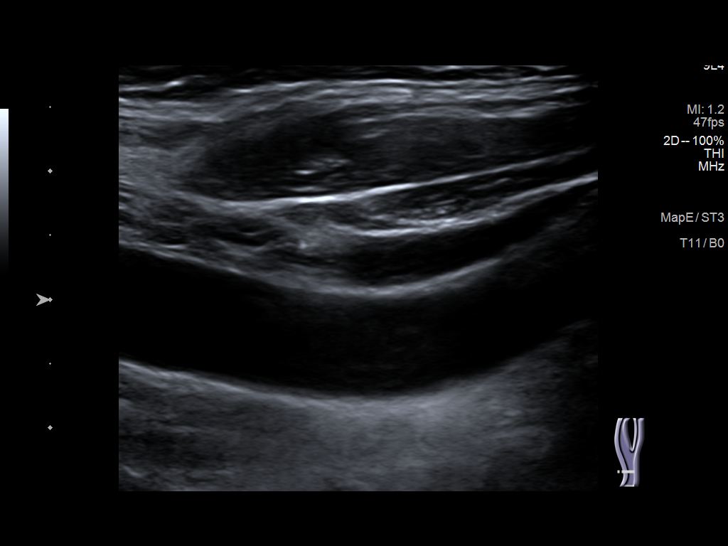
[im 17/96]
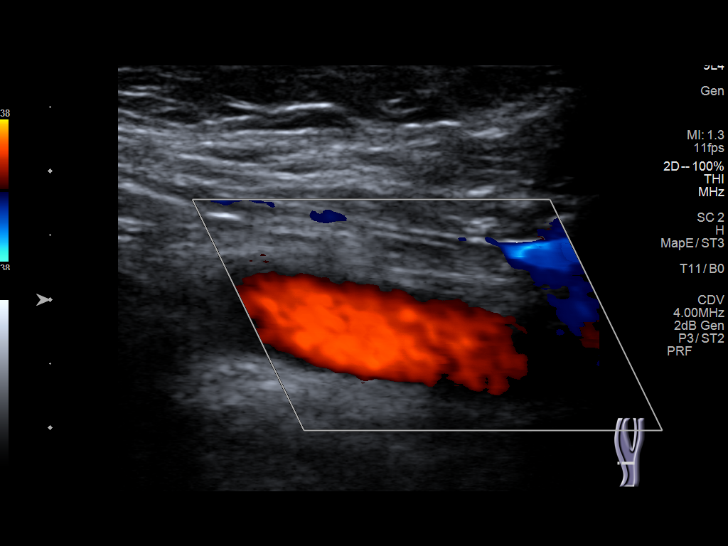
[im 25/96]
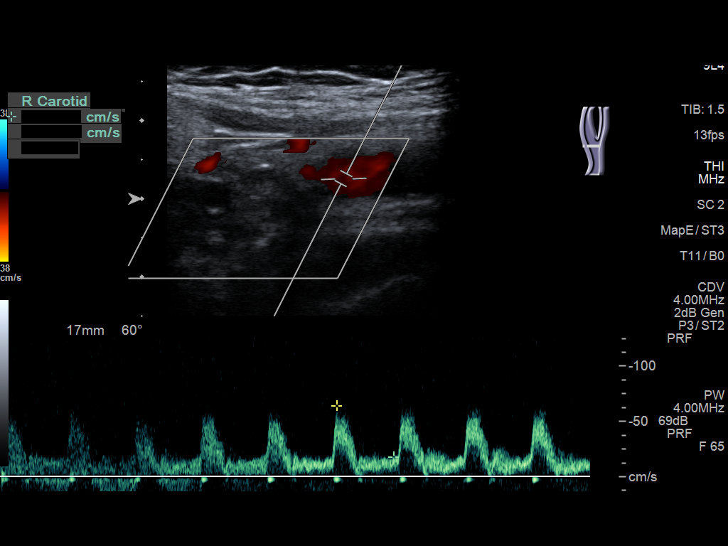
[im 34/96]
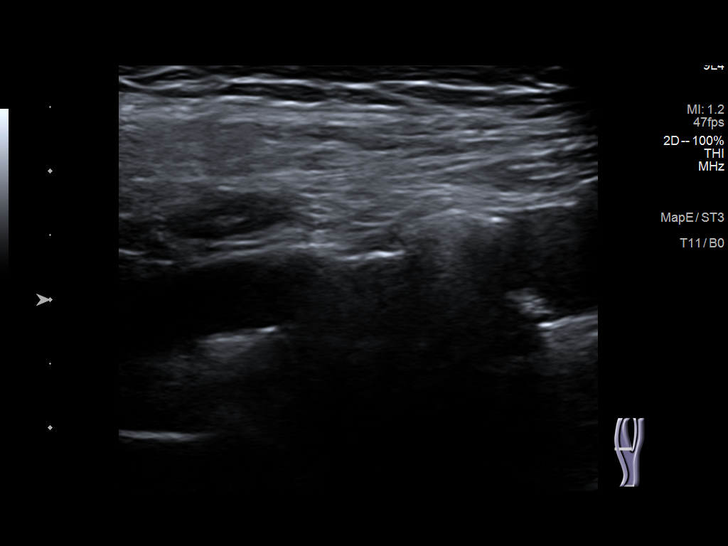
[im 42/96]
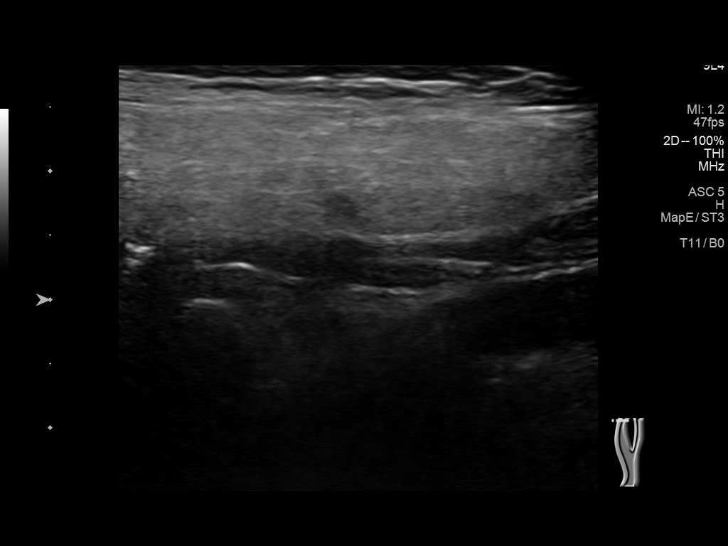
[im 50/96]
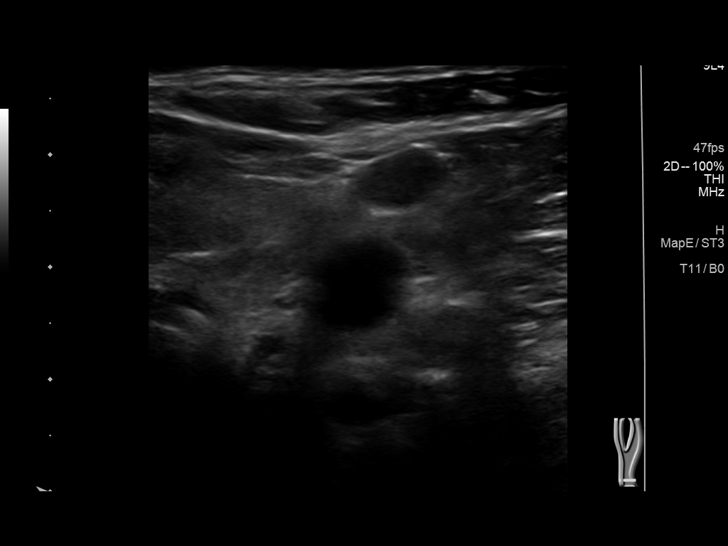
[im 54/96]
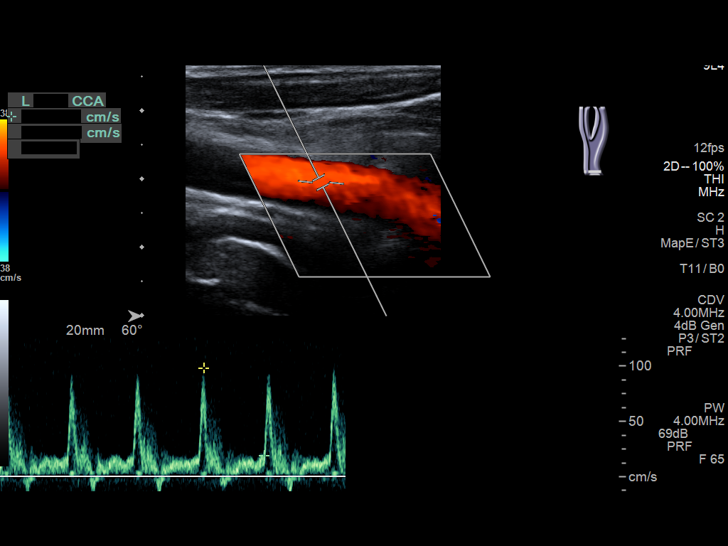
[im 62/96]
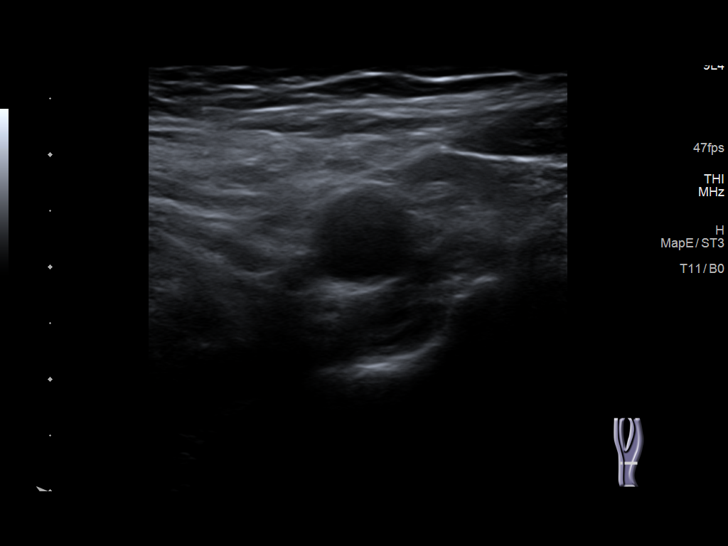
[im 71/96]
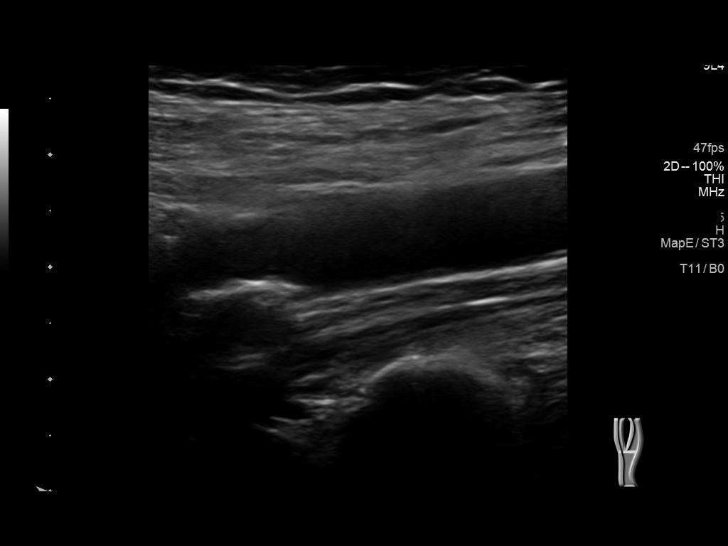
[im 79/96]
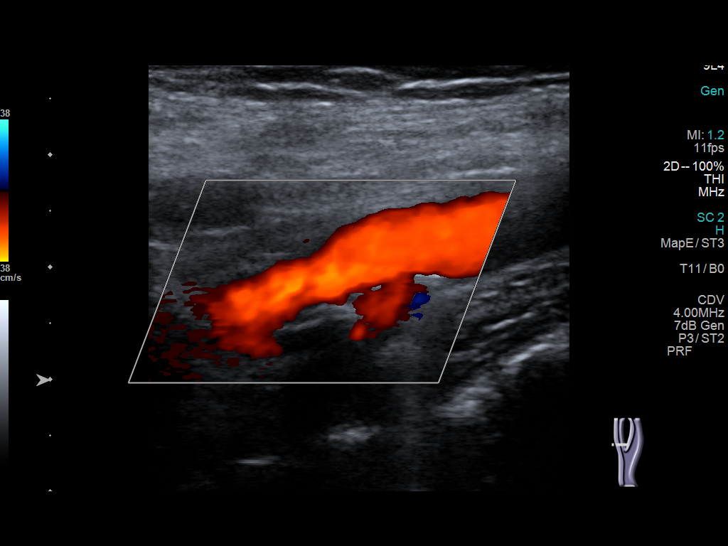
[im 87/96]
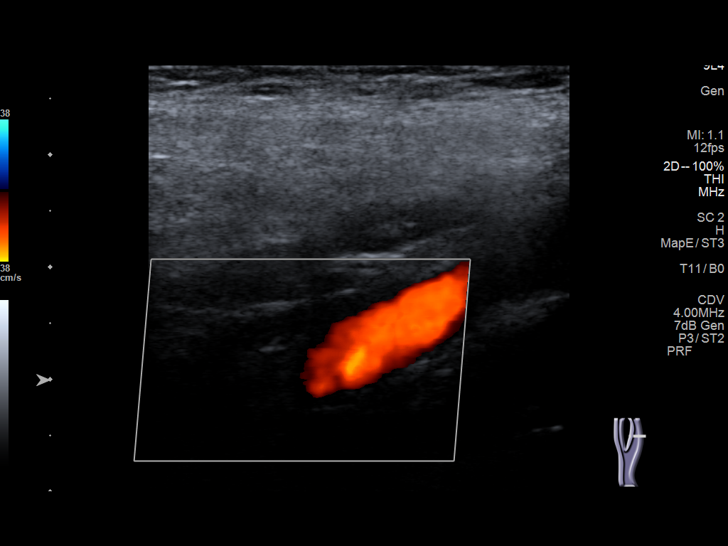
[im 96/96]
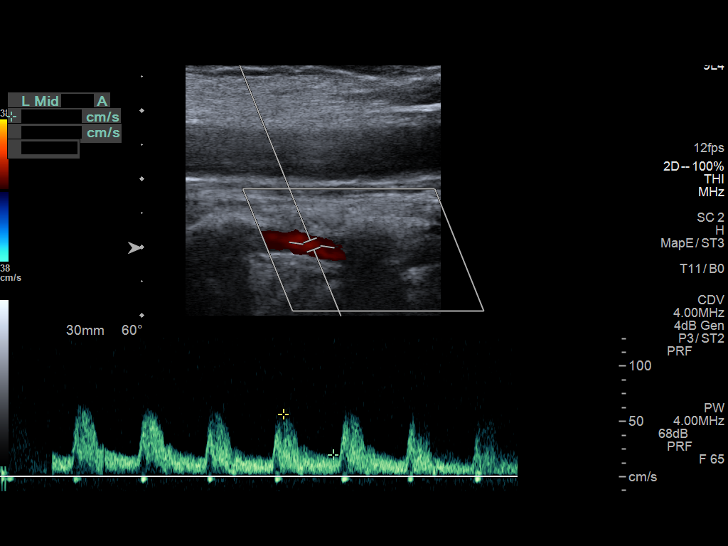

[13 of 24 positions shown; findings below may reference images not displayed]

FINDINGS: Criteria: Quantification of carotid stenosis is based on velocity
parameters that correlate the residual internal carotid diameter
with NASCET-based stenosis levels, using the diameter of the distal
internal carotid lumen as the denominator for stenosis measurement.

The following velocity measurements were obtained:

RIGHT

ICA: 86/31 cm/sec

CCA: 78/16 cm/sec

SYSTOLIC ICA/CCA RATIO:

ECA: 94 cm/sec

LEFT

ICA: 88/28 cm/sec

CCA: 98/23 cm/sec

SYSTOLIC ICA/CCA RATIO:

ECA: 97 cm/sec

RIGHT CAROTID ARTERY: Mild echogenic shadowing plaque formation. No
hemodynamically significant right ICA stenosis, velocity elevation,
or turbulent flow. Degree of narrowing less than 50%.

RIGHT VERTEBRAL ARTERY:  Normal antegrade flow

LEFT CAROTID ARTERY: Similar scattered mild echogenic plaque
formation. No hemodynamically significant left ICA stenosis,
velocity elevation, or turbulent flow.

LEFT VERTEBRAL ARTERY:  Normal antegrade flow
IMPRESSION: Mild bilateral carotid atherosclerosis. No hemodynamically
significant ICA stenosis. Degree of narrowing less than 50%
bilaterally by ultrasound criteria.

Patent antegrade vertebral flow bilaterally

## 2019-08-02 IMAGING — CT CT HEAD W/O CM
3 series · 15 of 47 positions shown, 18 images · non-contrast
Comparison: CT [DATE]

CLINICAL DATA: Left numbness and weakness for 1 day

EXAM:
CT HEAD WITHOUT CONTRAST
TECHNIQUE: Contiguous axial images were obtained from the base of the skull
through the vertex without intravenous contrast.

[Series 2: head w o · axial · 0.44mm/px · z∈[-91,+44]mm · 9 of 33 slices shown, 12 images]
[im 3/33  brain]
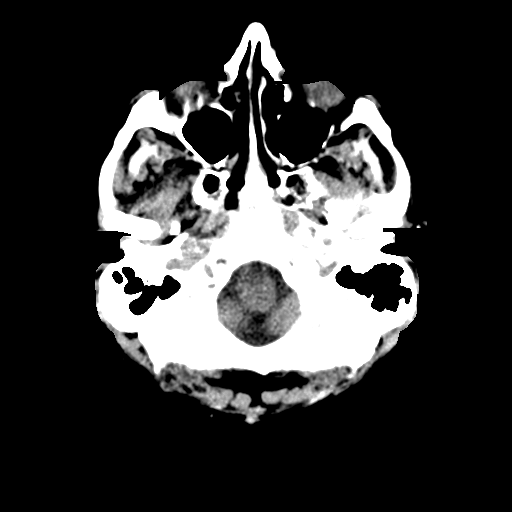
[im 3/33  bone]
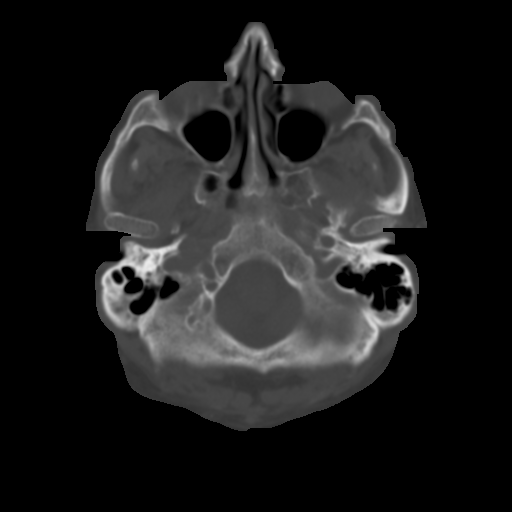
[im 6/33  brain]
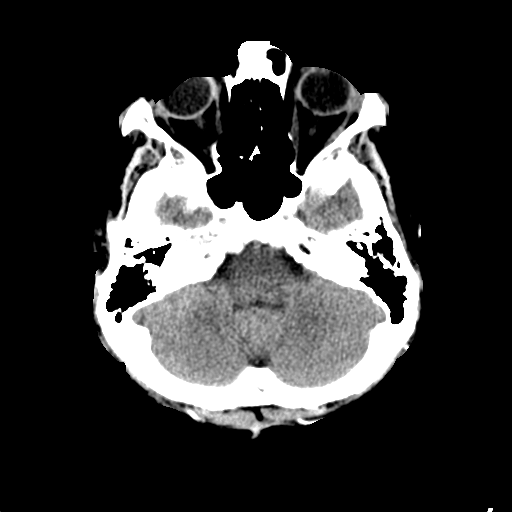
[im 9/33  brain]
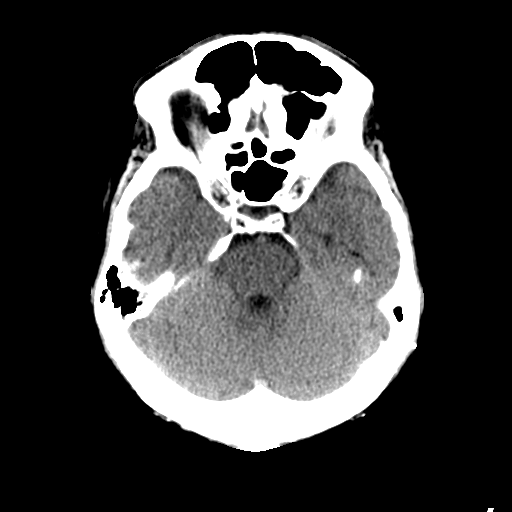
[im 13/33  brain]
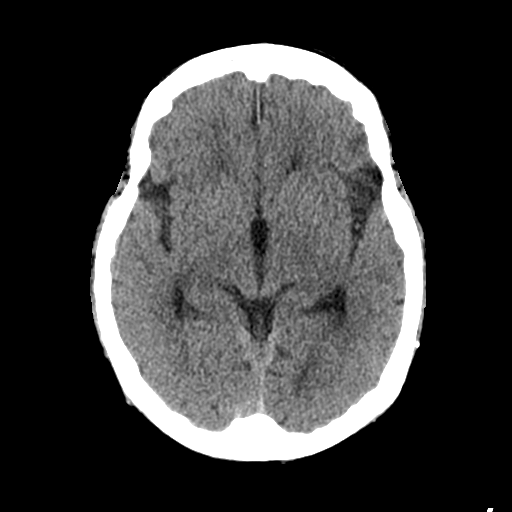
[im 17/33  brain]
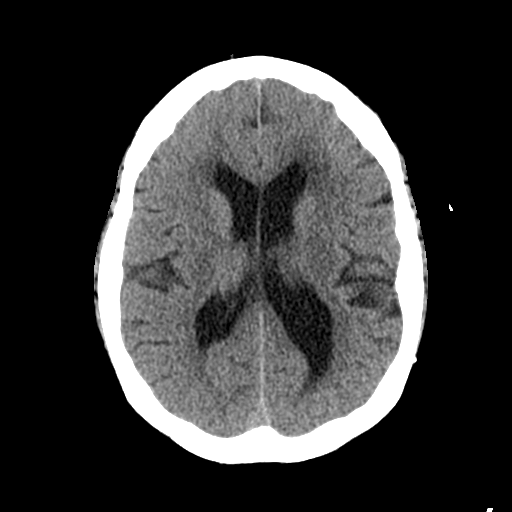
[im 17/33  bone]
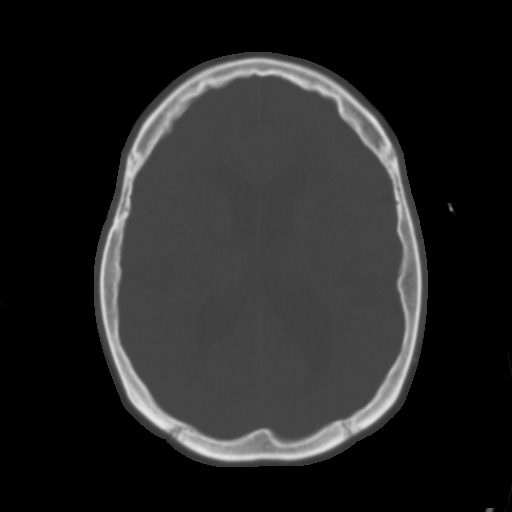
[im 20/33  brain]
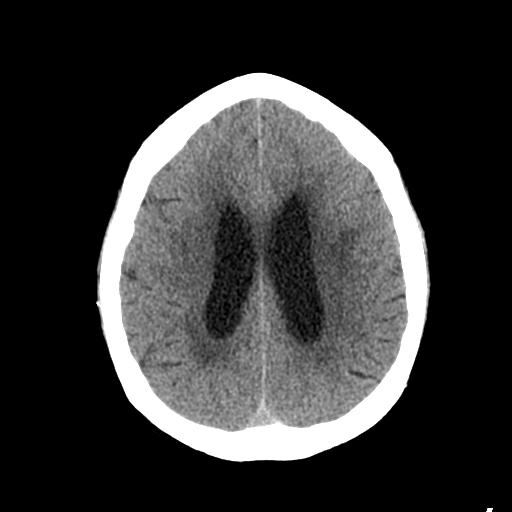
[im 24/33  brain]
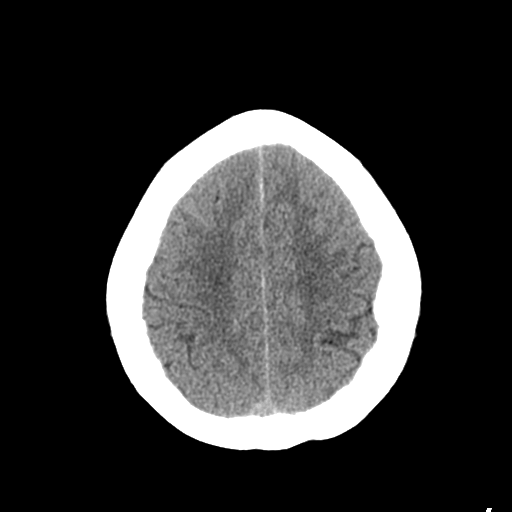
[im 27/33  brain]
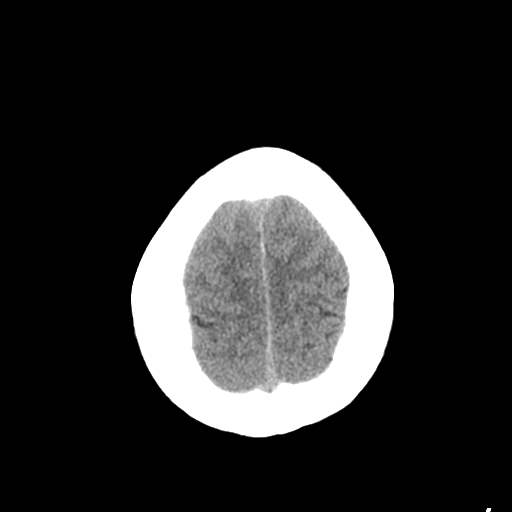
[im 30/33  brain]
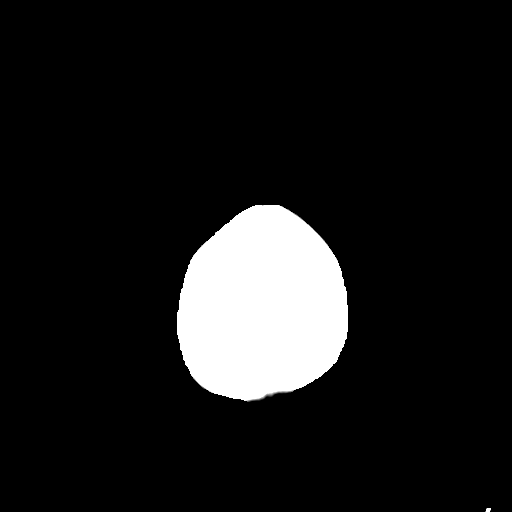
[im 30/33  bone]
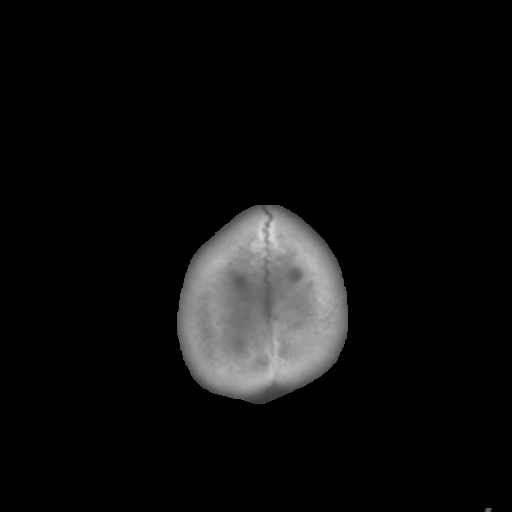

[Series 4: coronal soft · coronal · 0.35mm/px · 3 of 67 slices shown]
[im 23/67  brain]
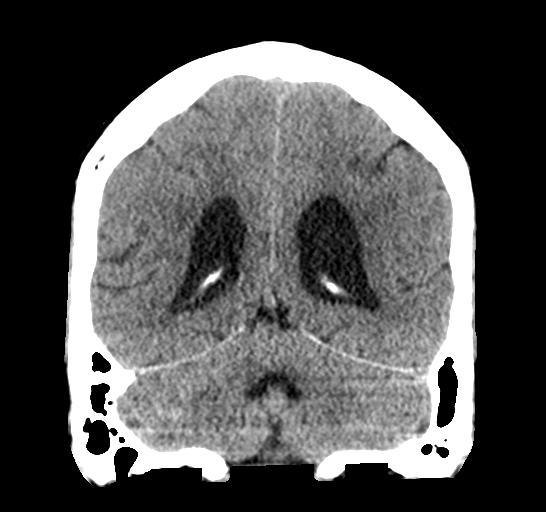
[im 30/67  brain]
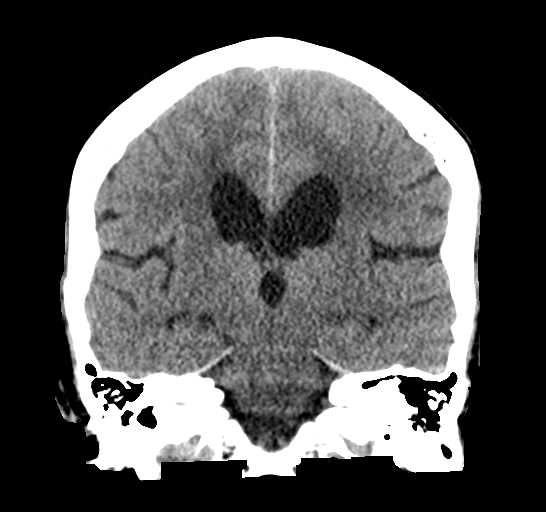
[im 37/67  brain]
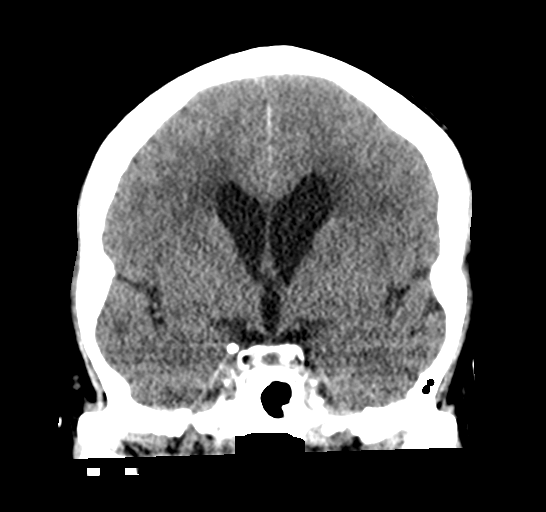

[Series 5: sagittal soft · sagittal · 0.37mm/px · 3 of 67 slices shown]
[im 23/67  brain]
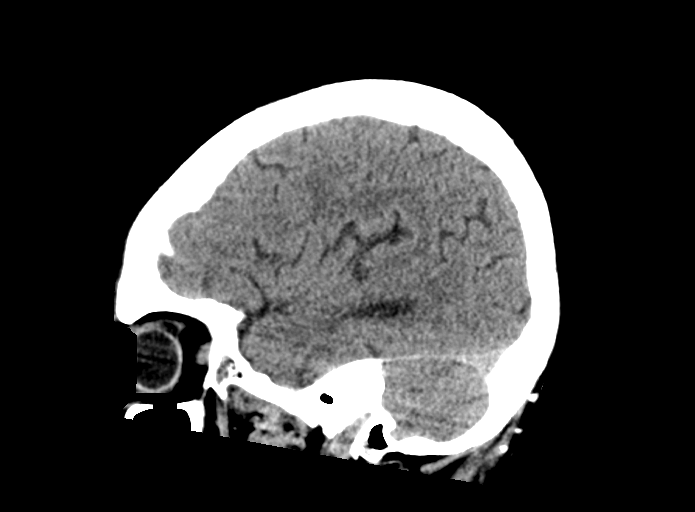
[im 34/67  brain]
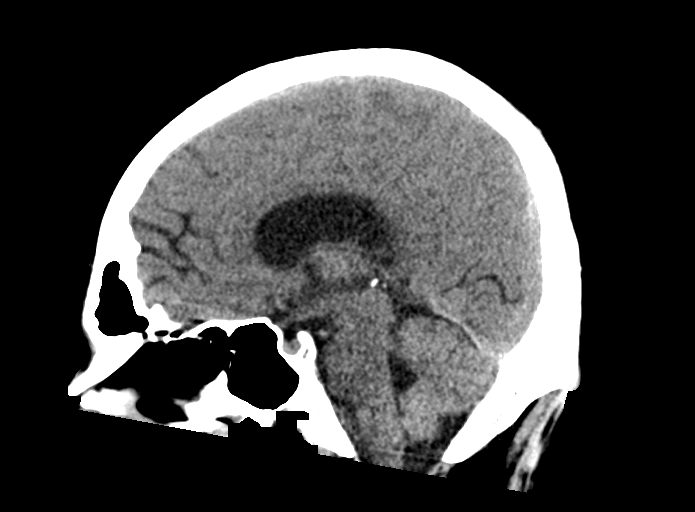
[im 45/67  brain]
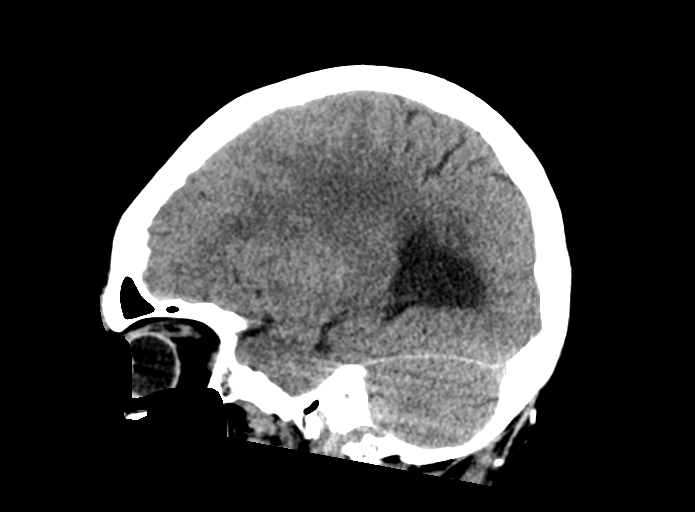

[15 of 47 positions shown; findings below may reference images not displayed]

FINDINGS: Brain: No evidence of acute infarction, hemorrhage, hydrocephalus,
extra-axial collection or mass lesion/mass effect. Symmetric
prominence of the ventricles, cisterns and sulci compatible with
parenchymal volume loss. Patchy areas of white matter
hypoattenuation are most compatible with chronic microvascular
angiopathy.

Vascular: Atherosclerotic calcification of the carotid siphons. No
hyperdense vessel.

Skull: No calvarial fracture or suspicious osseous lesion. No scalp
swelling or hematoma.

Sinuses/Orbits: Paranasal sinuses and mastoid air cells are
predominantly clear. Included orbital structures are unremarkable.

Other: Mostly edentulous with mandibular prognathism similar to
comparison.
IMPRESSION: 1. No acute intracranial abnormality. If there is persisting concern
for acute infarction, MRI is more sensitive and specific for early
features of ischemia.
2. Mild parenchymal volume loss and chronic microvascular ischemic
changes.
3. Intracranial atherosclerosis.

## 2019-08-02 MED ORDER — HYDRALAZINE HCL 25 MG PO TABS: 25.0000 mg | ORAL_TABLET | Freq: Four times a day (QID) | ORAL | Status: DC | PRN

## 2019-08-02 MED ORDER — INSULIN ASPART 100 UNIT/ML ~~LOC~~ SOLN
0.0000 [IU] | Freq: Four times a day (QID) | SUBCUTANEOUS | Status: DC
  Administered 2019-08-02: 1 [IU] via SUBCUTANEOUS
  Administered 2019-08-03: 2 [IU] via SUBCUTANEOUS

## 2019-08-02 MED ORDER — STROKE: EARLY STAGES OF RECOVERY BOOK
Freq: Once | Status: AC
  Filled 2019-08-02: qty 1

## 2019-08-02 MED ORDER — ACETAMINOPHEN 650 MG RE SUPP: 650.0000 mg | RECTAL | Status: DC | PRN

## 2019-08-02 MED ORDER — ACETAMINOPHEN 160 MG/5ML PO SOLN: 650.0000 mg | ORAL | Status: DC | PRN

## 2019-08-02 MED ORDER — CHLORHEXIDINE GLUCONATE CLOTH 2 % EX PADS
6.0000 | MEDICATED_PAD | Freq: Every day | CUTANEOUS | Status: DC
  Administered 2019-08-03: 6 via TOPICAL

## 2019-08-02 MED ORDER — CALCIUM GLUCONATE-NACL 1-0.675 GM/50ML-% IV SOLN
2.0000 g | Freq: Once | INTRAVENOUS | Status: AC
  Administered 2019-08-02: 2000 mg via INTRAVENOUS
  Filled 2019-08-02: qty 100

## 2019-08-02 MED ORDER — ACETAMINOPHEN 325 MG PO TABS
650.0000 mg | ORAL_TABLET | ORAL | Status: DC | PRN
  Administered 2019-08-03: 650 mg via ORAL
  Filled 2019-08-02: qty 2

## 2019-08-02 MED ORDER — APIXABAN 5 MG PO TABS
5.0000 mg | ORAL_TABLET | Freq: Two times a day (BID) | ORAL | Status: DC
  Administered 2019-08-02 – 2019-08-03 (×3): 5 mg via ORAL
  Filled 2019-08-02 (×3): qty 1

## 2019-08-02 MED ORDER — INSULIN GLARGINE 100 UNIT/ML ~~LOC~~ SOLN
10.0000 [IU] | Freq: Every day | SUBCUTANEOUS | Status: DC
  Administered 2019-08-02: 10 [IU] via SUBCUTANEOUS
  Filled 2019-08-02 (×3): qty 0.1

## 2019-08-02 MED ORDER — ONDANSETRON 8 MG PO TBDP
8.0000 mg | ORAL_TABLET | Freq: Once | ORAL | Status: AC
  Administered 2019-08-02: 8 mg via ORAL
  Filled 2019-08-02: qty 1

## 2019-08-02 MED ORDER — CALCIUM ACETATE (PHOS BINDER) 667 MG PO CAPS
667.0000 mg | ORAL_CAPSULE | Freq: Three times a day (TID) | ORAL | Status: DC
  Administered 2019-08-02 – 2019-08-03 (×5): 667 mg via ORAL
  Filled 2019-08-02 (×14): qty 1

## 2019-08-02 MED ORDER — ATORVASTATIN CALCIUM 10 MG PO TABS
10.0000 mg | ORAL_TABLET | Freq: Every evening | ORAL | Status: DC
  Administered 2019-08-02: 10 mg via ORAL
  Filled 2019-08-02: qty 1

## 2019-08-02 MED ORDER — CALCIUM CARBONATE ANTACID 500 MG PO CHEW
800.0000 mg | CHEWABLE_TABLET | Freq: Two times a day (BID) | ORAL | Status: DC
  Administered 2019-08-02 – 2019-08-03 (×3): 800 mg via ORAL
  Filled 2019-08-02 (×3): qty 4

## 2019-08-02 MED ORDER — AMIODARONE HCL 200 MG PO TABS
200.0000 mg | ORAL_TABLET | Freq: Every day | ORAL | Status: DC
  Administered 2019-08-02 – 2019-08-03 (×2): 200 mg via ORAL
  Filled 2019-08-02 (×2): qty 1

## 2019-08-02 NOTE — ED Notes (Signed)
Attempted to call report to [phone]

## 2019-08-02 NOTE — ED Notes (Signed)
Diaylsis is done and nurse asked pt if he was ready to eat lunch and get his lunch meds. Pt prefers to wait a few minutes longer, possibly when he goes upstairs.

## 2019-08-02 NOTE — Progress Notes (Signed)
Patient seen and examined.  Admitted after midnight secondary to left-sided numbness and weakness.  Prior history significant for end-stage renal disease on hemo-dialysis (Monday, Wednesday and Friday; missed hemodialysis on Wednesday) and paroxysmal atrial fibrillation (chronically on Eliquis); with symptoms of left-sided weakness and numbness since Wednesday.  Patient was out of therapeutic window for TPA, hemodynamically stable.  Telemetry neurology consulted recommending TIA/stroke work-up with MRI, carotid Dopplers and 2D echo.  Also recommended checking A1c, lipid profile, TSH, ESR, CRP and B12.  Continue statins and Eliquis.  Please refer to H&P written by Dr. Darrick Meigs for further info/details on admission.  Plan: -follow neurology recommendations -complete TIA/stroke work up -nephrology to see patient and assist with HD.  Barton Dubois MD [phone]

## 2019-08-02 NOTE — Consult Note (Signed)
[name] Admit Date: 08/02/2019 08/02/2019 [name] Requesting Physician:  Darrick Meigs MD  Reason for Consult:  ESRD comanagement HPI:  56M ESRD MWF (usually only MF) at [place] AVG, nonahderence, history recurrent severe hyperkalemia, DM2, HTN, PTSD, PAD s/p R BKA presented overnight to ED with L sided weakness of UE/LE/Face. CT head neg, MRI w/o acute infarct.    Last HD 6/21.  Labs here K 4.4, HCO3 27, Hb 11.2. BP stable. No SOB.  No problmes with LUE AVG.  No sig LEE.      Creatinine, Ser (mg/dL)  Date Value  08/02/2019 9.93 (H)  06/22/2019 10.50 (H)  06/10/2019 8.68 (H)  06/09/2019 6.93 (H)  06/08/2019 4.66 (H)  06/07/2019 9.30 (H)  06/07/2019 8.59 (H)  06/07/2019 12.27 (H)  05/16/2019 11.00 (H)  02/20/2019 8.88 (H)  ] ROS Balance of 12 systems is negative w/ exceptions as above  PMH  Past Medical History:  Diagnosis Date  . Anemia   . Anxiety   . Blood transfusion without reported diagnosis   . CHF (congestive heart failure) ([place])   . Chronic kidney disease   . Depression   . Diabetes mellitus without complication ([place])   . End stage renal disease ([place])    M/W/F [facility]  . Hyperlipidemia   . Neuropathy   . Peripheral vascular disease ([place])   . PTSD (post-traumatic stress disorder)    PSH  Past Surgical History:  Procedure Laterality Date  . A/V FISTULAGRAM N/A 10/09/2018   Procedure: A/V FISTULAGRAM;  Surgeon: Serafina Mitchell, MD;  Location: [place] CV LAB;  Service: Cardiovascular;  Laterality: N/A;  . AV FISTULA PLACEMENT Left 09/22/2016   Procedure: CREATION OF LEFT ARM ARTERIOVENOUS (AV) FISTULA;  Surgeon: Waynetta Sandy, MD;  Location: [place];  Service: Vascular;  Laterality: Left;  . AV FISTULA PLACEMENT Left 10/31/2017   Procedure: INSERTION OF ARTERIOVENOUS (AV) GORE-TEX 4-11mm STETCH GRAFT LEFT ARM;  Surgeon: Waynetta Sandy, MD;  Location: [place];  Service: Vascular;  Laterality: Left;  . BASCILIC VEIN  TRANSPOSITION Left 08/17/2017   Procedure: SECOND STAGE BASILIC VEIN TRANSPOSITION LEFT ARM;  Surgeon: Waynetta Sandy, MD;  Location: [place];  Service: Vascular;  Laterality: Left;  . BELOW KNEE LEG AMPUTATION Right   . CARDIOVERSION N/A 02/19/2019   Procedure: CARDIOVERSION;  Surgeon: [name], MD;  Location: [facility] ENDOSCOPY;  Service: Cardiovascular;  Laterality: N/A;  . CHOLECYSTECTOMY    . FOOT SURGERY    . IR FLUORO GUIDE CV LINE RIGHT  05/16/2016  . IR FLUORO GUIDE CV LINE RIGHT  02/16/2019  . IR REMOVAL TUN CV CATH W/O FL  05/16/2016  . IR REMOVAL TUN CV CATH W/O FL  02/19/2019  . IR THROMBECTOMY AV FISTULA W/THROMBOLYSIS/PTA INC/SHUNT/IMG LEFT Left 11/09/2018  . IR THROMBECTOMY AV FISTULA W/THROMBOLYSIS/PTA INC/SHUNT/IMG LEFT Left 06/22/2019  . IR US GUIDE VASC ACCESS LEFT  11/09/2018  . IR US GUIDE VASC ACCESS LEFT  06/22/2019  . IR US GUIDE VASC ACCESS RIGHT  05/16/2016  . IR US GUIDE VASC ACCESS RIGHT  02/16/2019  . PERIPHERAL VASCULAR BALLOON ANGIOPLASTY  10/09/2018   Procedure: PERIPHERAL VASCULAR BALLOON ANGIOPLASTY;  Surgeon: Serafina Mitchell, MD;  Location: [place] CV LAB;  Service: Cardiovascular;;  . TEE WITHOUT CARDIOVERSION N/A 02/19/2019   Procedure: TRANSESOPHAGEAL ECHOCARDIOGRAM (TEE);  Surgeon: [name], MD;  Location: [facility] ENDOSCOPY;  Service: Cardiovascular;  Laterality: N/A;   [place]  Family History  Problem Relation Age of Onset  . Cancer Mother        lung  . Diabetes Mother   . Heart attack Father   . Diabetes Father   . Diabetes Sister    SH  reports that he quit smoking about 12 years ago. He has never used smokeless tobacco. He reports that he does not drink alcohol and does not use drugs. Allergies  Allergies  Allergen Reactions  . Tape Other (See Comments)    Pulls skin off   Home medications Prior to Admission medications   Medication Sig Start Date End Date Taking? Authorizing Provider  amiodarone (PACERONE) 200 MG tablet Take 1 tablet  (200 mg total) by mouth daily. 02/21/19   Donnamae Jude, MD  apixaban (ELIQUIS) 5 MG TABS tablet Take 1 tablet (5 mg total) by mouth 2 (two) times daily. 02/20/19   Donnamae Jude, MD  aspirin 81 MG chewable tablet Chew 81 mg by mouth daily.    [provider]  atorvastatin (LIPITOR) 10 MG tablet Take 1 tablet (10 mg total) by mouth every evening. 02/20/19   Donnamae Jude, MD  calcium acetate (PHOSLO) 667 MG capsule Take 1 capsule (667 mg total) by mouth 3 (three) times daily with meals. 02/21/19   Donnamae Jude, MD  HYDROcodone-acetaminophen (NORCO/VICODIN) 5-325 MG tablet Take 1 tablet by mouth every 6 (six) hours as needed for moderate pain. 06/09/19   Kathie Dike, MD  LANTUS SOLOSTAR 100 UNIT/ML Solostar Pen Inject 10 Units into the skin at bedtime.  01/18/19   [provider]  lidocaine-prilocaine (EMLA) cream Apply 1 application topically See admin instructions. 05/06/19   [provider]  multivitamin (RENA-VIT) TABS tablet Take 1 tablet by mouth daily. 08/04/18   [provider]  patiromer (VELTASSA) 8.4 g packet Take 1 packet (8.4 g total) by mouth 3 (three) times a week. On Tuesday, Thursday and Saturday 06/10/19   Kathie Dike, MD  tiZANidine (ZANAFLEX) 4 MG tablet Take 4 mg by mouth daily as needed. 04/07/19   [provider]    Current Medications Scheduled Meds: .  stroke: mapping our early stages of recovery book   Does not apply Once  . amiodarone  200 mg Oral Daily  . apixaban  5 mg Oral BID  . atorvastatin  10 mg Oral QPM  . calcium acetate  667 mg Oral TID WC  . Chlorhexidine Gluconate Cloth  6 each Topical Q0600  . insulin aspart  0-9 Units Subcutaneous Q6H  . insulin glargine  10 Units Subcutaneous QHS   Continuous Infusions: PRN Meds:.acetaminophen **OR** acetaminophen (TYLENOL) oral liquid 160 mg/5 mL **OR** acetaminophen, hydrALAZINE  CBC Recent Labs  Lab 08/02/19 0409  WBC 7.1  NEUTROABS 4.4  HGB 11.2*  HCT 35.8*   MCV 107.2*  PLT 865   Basic Metabolic Panel Recent Labs  Lab 08/02/19 0409  NA 139  K 4.4  CL 96*  CO2 27  GLUCOSE 84  BUN 39*  CREATININE 9.93*  CALCIUM 6.5*    Physical Exam  Blood pressure 123/67, pulse 81, temperature 97.7 F (36.5 C), temperature source Oral, resp. rate 16, height 6' (1.829 m), weight 85.3 kg, SpO2 95 %. GEN: NAD, chornically ill appearing ENT: NCAT EYES: EOMI CV: RRR no rub PULM: CTAB, nl wob ABD: s/nt/nd SKIN: R BKA stump EXT:LLE no LEE VASC: LUE AVG +B/T   Assessment 44M ESRD MWF LUE AVG [facility] here with acute weakness on L side,  neg imaging for acute CVA.  1. ESRD MWF, LUE AVG 2. L sided weakness, neg head CT and MRI, per TRH/Neuro 3. DM2 4. HTN 5. Nonadherence to dialysis 6. Hx/o severe hyperkalemia 7. Anemia, Hb stable 8. Hypocalcemia, Ca 6.5, Album 3.8; suspect related to [place] 1. HD today, 2K, 3.5 Ca bath.  2-3L UF 2. I wonder if #8 caused some paresthesias/weakness  To accoutn for his presenting symptoms? 3. Tums BIC, not as abinder 4. Will need VDRA as outpt, improved BMD mgmt  [name]  [phone] pgr 08/02/2019, 9:24 AM

## 2019-08-02 NOTE — Plan of Care (Signed)

## 2019-08-02 NOTE — ED Triage Notes (Signed)
Pt missed dialysis  Wednesday (M,W,F), states his leg was painful.  Pt states he also started having chest pain tonight.

## 2019-08-02 NOTE — ED Notes (Signed)
Attempted to call report to [phone]

## 2019-08-02 NOTE — ED Provider Notes (Signed)
[facility] EMERGENCY DEPARTMENT Provider Note   CSN: [id number] Arrival date & time: 08/02/19  0255   Time seen 3:08 AM  History Chief Complaint  Patient presents with  . Weakness    Brendan Campbell is a 49 y.o. male.  HPI   Patient has a history of end-stage renal disease and he has had problems missing appointments.  His last visit to the hospital he went into cardiac arrest from hyperkalemia.  Patient states he normally goes on Monday, Wednesday, and Fridays (today).  Patient missed Wednesday.  He states he had some soreness behind his right knee from his prosthetic leg.  He states he normally goes to [place] at [place].  He states he has the Lucianne Lei that picks him up.  He does not have a wheelchair at home to use if he does not feel like he should be walking.  He states also on 2022-08-21 he states he has a dead numb feeling on his left side of his body including his left face, left upper arm and left lower leg.  He states he also feels like he is weak on the left side.  He states he started having a headache yesterday, June 24 and it is frontal and described as pressure and throbbing.  He denies any new visual changes.  He has had some questionable nausea but no vomiting.  PCP Monico Blitz, MD   Past Medical History:  Diagnosis Date  . Anemia   . Anxiety   . Blood transfusion without reported diagnosis   . CHF (congestive heart failure) ([place])   . Chronic kidney disease   . Depression   . Diabetes mellitus without complication ([place])   . End stage renal disease ([place])    M/W/F [facility]  . Hyperlipidemia   . Neuropathy   . Peripheral vascular disease ([place])   . PTSD (post-traumatic stress disorder)     Patient Active Problem List   Diagnosis Date Noted  . Cardiac arrest ([place]) 06/07/2019  . Atrial flutter ([place])   . CHF (congestive heart failure) ([place])   . Neuropathy   . Peripheral vascular disease ([place])   . Abnormal EKG   . Leukocytosis   . Atypical pneumonia   .  ESRD needing dialysis ([place])   . Acute pulmonary edema (H[facility])   . ESRD on hemodialysis ([place])   . Hyperkalemia 10/05/2018  . Weakness 10/05/2018  . Chest pain 05/10/2016  . Anemia due to end stage renal disease ([place]) 05/10/2016  . 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 Lumbar radiculopathy 03/08/2016  . S/P below knee amputation, right ([place]) 03/05/2015  . DM2 (diabetes mellitus, type 2) ([place]) 03/03/2015  . HLD (hyperlipidemia) 03/03/2015    Past Surgical History:  Procedure Laterality Date  . A/V FISTULAGRAM N/A 10/09/2018   Procedure: A/V FISTULAGRAM;  Surgeon: Serafina Mitchell, MD;  Location: [place] [facility];  Service: Cardiovascular;  Laterality: N/A;  . AV FISTULA PLACEMENT Left 09/22/2016   Procedure: CREATION OF LEFT ARM ARTERIOVENOUS (AV) FISTULA;  Surgeon: Waynetta Sandy, MD;  Location: [place];  Service: Vascular;  Laterality: Left;  . AV FISTULA PLACEMENT Left 10/31/2017   Procedure: INSERTION OF ARTERIOVENOUS (AV) GORE-TEX 4-86mm STETCH GRAFT LEFT ARM;  Surgeon: Waynetta Sandy, MD;  Location: [place];  Service: Vascular;  Laterality: Left;  . BASCILIC VEIN TRANSPOSITION Left 08/17/2017   Procedure: SECOND STAGE BASILIC VEIN TRANSPOSITION LEFT ARM;  Surgeon: Waynetta Sandy, MD;  Location: [place];  Service: Vascular;  Laterality: Left;  . BELOW KNEE LEG AMPUTATION Right   . CARDIOVERSION N/A 02/19/2019   Procedure: CARDIOVERSION;  Surgeon: [name], MD;  Location: [facility] ENDOSCOPY;  Service: Cardiovascular;  Laterality: N/A;  . CHOLECYSTECTOMY    . FOOT SURGERY    . IR FLUORO GUIDE CV LINE RIGHT  05/16/2016  . IR FLUORO GUIDE CV LINE RIGHT  02/16/2019  . IR REMOVAL TUN CV CATH W/O FL  05/16/2016  . IR REMOVAL TUN CV CATH W/O FL  02/19/2019  . IR THROMBECTOMY AV FISTULA W/THROMBOLYSIS/PTA INC/SHUNT/IMG LEFT Left 11/09/2018  . IR THROMBECTOMY AV FISTULA W/THROMBOLYSIS/PTA INC/SHUNT/IMG LEFT Left 06/22/2019  . IR US GUIDE VASC ACCESS LEFT  11/09/2018  . IR  US GUIDE VASC ACCESS LEFT  06/22/2019  . IR US GUIDE VASC ACCESS RIGHT  05/16/2016  . IR US GUIDE VASC ACCESS RIGHT  02/16/2019  . PERIPHERAL VASCULAR BALLOON ANGIOPLASTY  10/09/2018   Procedure: PERIPHERAL VASCULAR BALLOON ANGIOPLASTY;  Surgeon: Serafina Mitchell, MD;  Location: [place] CV LAB;  Service: Cardiovascular;;  . TEE WITHOUT CARDIOVERSION N/A 02/19/2019   Procedure: TRANSESOPHAGEAL ECHOCARDIOGRAM (TEE);  Surgeon: [name], MD;  Location: [facility] ENDOSCOPY;  Service: Cardiovascular;  Laterality: N/A;       Family History  Problem Relation Age of Onset  . Cancer Mother        lung  . Diabetes Mother   . Heart attack Father   . Diabetes Father   . Diabetes Sister     Social History   Tobacco Use  . Smoking status: Former Smoker    Quit date: 09/03/2006    Years since quitting: 12.9  . Smokeless tobacco: Never Used  Vaping Use  . Vaping Use: Never used  Substance Use Topics  . Alcohol use: No  . Drug use: No  lives with a roommate  Home Medications Prior to Admission medications   Medication Sig Start Date End Date Taking? Authorizing Provider  amiodarone (PACERONE) 200 MG tablet Take 1 tablet (200 mg total) by mouth daily. 02/21/19   Donnamae Jude, MD  apixaban (ELIQUIS) 5 MG TABS tablet Take 1 tablet (5 mg total) by mouth 2 (two) times daily. 02/20/19   Donnamae Jude, MD  aspirin 81 MG chewable tablet Chew 81 mg by mouth daily.    [provider]  atorvastatin (LIPITOR) 10 MG tablet Take 1 tablet (10 mg total) by mouth every evening. 02/20/19   Donnamae Jude, MD  calcium acetate (PHOSLO) 667 MG capsule Take 1 capsule (667 mg total) by mouth 3 (three) times daily with meals. 02/21/19   Donnamae Jude, MD  HYDROcodone-acetaminophen (NORCO/VICODIN) 5-325 MG tablet Take 1 tablet by mouth every 6 (six) hours as needed for moderate pain. 06/09/19   Kathie Dike, MD  LANTUS SOLOSTAR 100 UNIT/ML Solostar Pen Inject 10 Units into the skin at bedtime.  01/18/19    [provider]  lidocaine-prilocaine (EMLA) cream Apply 1 application topically See admin instructions. 05/06/19   [provider]  multivitamin (RENA-VIT) TABS tablet Take 1 tablet by mouth daily. 08/04/18   [provider]  patiromer (VELTASSA) 8.4 g packet Take 1 packet (8.4 g total) by mouth 3 (three) times a week. On Tuesday, Thursday and Saturday 06/10/19   Kathie Dike, MD  tiZANidine (ZANAFLEX) 4 MG tablet Take 4 mg by mouth daily as needed. 04/07/19   [provider]    Allergies    Tape  Review  of Systems   Review of Systems  All other systems reviewed and are negative.   Physical Exam Updated Vital Signs BP (!) 154/79   Pulse 82   Temp 97.7 F (36.5 C) (Oral)   Resp 15   Ht 6' (1.829 m)   Wt 85.3 kg   SpO2 93%   BMI 25.50 kg/m   Physical Exam Vitals and nursing note reviewed.  Constitutional:      Appearance: Normal appearance. He is obese.  HENT:     Head: Normocephalic and atraumatic.     Right Ear: External ear normal.     Left Ear: External ear normal.     Mouth/Throat:     Mouth: Mucous membranes are dry.     Pharynx: No oropharyngeal exudate or posterior oropharyngeal erythema.  Eyes:     Extraocular Movements: Extraocular movements intact.     Conjunctiva/sclera: Conjunctivae normal.     Pupils: Pupils are equal, round, and reactive to light.  Cardiovascular:     Rate and Rhythm: Normal rate and regular rhythm.     Pulses: Normal pulses.     Heart sounds: No murmur heard.   Pulmonary:     Effort: Pulmonary effort is normal. No respiratory distress.     Breath sounds: Normal breath sounds.  Musculoskeletal:        General: Normal range of motion.     Cervical back: Normal range of motion and neck supple.     Comments: Patient is status post right BKA.  When I look behind the knee in the popliteal area there is maybe a quarter sized area that is very faintly red, there is no ulcer noted.  Skin:    General: Skin  is warm and dry.  Neurological:     General: No focal deficit present.     Mental Status: He is alert and oriented to person, place, and time.     Cranial Nerves: No cranial nerve deficit.     Sensory: Sensory deficit:      Comments: Patient states he has decreased sensation to light touch in the left side of his face left upper arm and left lower leg.  He has no obvious motor deficit.  Psychiatric:        Mood and Affect: Affect is flat.        Behavior: Behavior normal.        Thought Content: Thought content normal.     ED Results / Procedures / Treatments   Labs (all labs ordered are listed, but only abnormal results are displayed) Results for orders placed or performed during the hospital encounter of 08/02/19  Ethanol  Result Value Ref Range   Alcohol, Ethyl (B) <10 <10 mg/dL  Protime-INR  Result Value Ref Range   Prothrombin Time 13.3 11.4 - 15.2 seconds   INR 1.1 0.8 - 1.2  APTT  Result Value Ref Range   aPTT 38 (H) 24 - 36 seconds  CBC  Result Value Ref Range   WBC 7.1 4.0 - 10.5 K/uL   RBC 3.34 (L) 4.22 - 5.81 MIL/uL   Hemoglobin 11.2 (L) 13.0 - 17.0 g/dL   HCT 35.8 (L) 39 - 52 %   MCV 107.2 (H) 80.0 - 100.0 fL   MCH 33.5 26.0 - 34.0 pg   MCHC 31.3 30.0 - 36.0 g/dL   RDW 13.1 11.5 - 15.5 %   Platelets 221 150 - 400 K/uL   nRBC 0.0 0.0 - 0.2 %  Differential  Result Value Ref Range   Neutrophils Relative % 62 %   Neutro Abs 4.4 1.7 - 7.7 K/uL   Lymphocytes Relative 25 %   Lymphs Abs 1.8 0.7 - 4.0 K/uL   Monocytes Relative 8 %   Monocytes Absolute 0.6 0 - 1 K/uL   Eosinophils Relative 4 %   Eosinophils Absolute 0.3 0 - 0 K/uL   Basophils Relative 1 %   Basophils Absolute 0.1 0 - 0 K/uL   Immature Granulocytes 0 %   Abs Immature Granulocytes 0.02 0.00 - 0.07 K/uL  Comprehensive metabolic panel  Result Value Ref Range   Sodium 139 135 - 145 mmol/L   Potassium 4.4 3.5 - 5.1 mmol/L   Chloride 96 (L) 98 - 111 mmol/L   CO2 27 22 - 32 mmol/L   Glucose, Bld  84 70 - 99 mg/dL   BUN 39 (H) 6 - 20 mg/dL   Creatinine, Ser 9.93 (H) 0.61 - 1.24 mg/dL   Calcium 6.5 (L) 8.9 - 10.3 mg/dL   Total Protein 7.1 6.5 - 8.1 g/dL   Albumin 3.8 3.5 - 5.0 g/dL   AST 15 15 - 41 U/L   ALT 14 0 - 44 U/L   Alkaline Phosphatase 104 38 - 126 U/L   Total Bilirubin 0.3 0.3 - 1.2 mg/dL   GFR calc non Af Amer 6 (L) >60 mL/min   GFR calc Af Amer 6 (L) >60 mL/min   Anion gap 16 (H) 5 - 15   Laboratory interpretation all normal except chronic renal failure, mild anemia    EKG EKG Interpretation  Date/Time:  Friday August 02 2019 03:02:56 EDT Ventricular Rate:  84 PR Interval:    QRS Duration: 131 QT Interval:  442 QTC Calculation: 523 R Axis:   -63 Text Interpretation: Sinus rhythm Prolonged PR interval Left bundle branch block peaked T waves Confirmed by Rolland Porter [phone]) on 08/02/2019 3:08:20 AM   Radiology CT Head Wo Contrast  Result Date: 08/02/2019 CLINICAL DATA:  Left numbness and weakness for 1 day EXAM: CT HEAD WITHOUT CONTRAST TECHNIQUE: Contiguous axial images were obtained from the base of the skull through the vertex without intravenous contrast. COMPARISON:  CT 10/05/2018 FINDINGS: Brain: No evidence of acute infarction, hemorrhage, hydrocephalus, extra-axial collection or mass lesion/mass effect. Symmetric prominence of the ventricles, cisterns and sulci compatible with parenchymal volume loss. Patchy areas of white matter hypoattenuation are most compatible with chronic microvascular angiopathy. Vascular: Atherosclerotic calcification of the carotid siphons. No hyperdense vessel. Skull: No calvarial fracture or suspicious osseous lesion. No scalp swelling or hematoma. Sinuses/Orbits: Paranasal sinuses and mastoid air cells are predominantly clear. Included orbital structures are unremarkable. Other: Mostly edentulous with mandibular prognathism similar to comparison. IMPRESSION: 1. No acute intracranial abnormality. If there is persisting concern for  acute infarction, MRI is more sensitive and specific for early features of ischemia. 2. Mild parenchymal volume loss and chronic microvascular ischemic changes. 3. Intracranial atherosclerosis. Electronically Signed   By: Lovena Le M.D.   On: 08/02/2019 03:55    Procedures Procedures (including critical care time)  Echocardiogram February 19, 2019 IMPRESSIONS   1. Left ventricular ejection fraction, by visual estimation, is 20 to  25%. The left ventricle has severely decreased function. There is no left  ventricular hypertrophy.  2. The left ventricle demonstrates global hypokinesis.  3. Severe hypokinesis of the left ventricular, entire anterior wall.  4. Global right ventricle has mildly reduced systolic function.The right  ventricular size is normal. No  increase in right ventricular wall  thickness.  5. Left atrial size was moderately dilated.  6. Right atrial size was mildly dilated.  7. The aortic valve is tricuspid. Aortic valve regurgitation is not  visualized.  8. The mitral valve is grossly normal. Mild mitral valve regurgitation.  9. The tricuspid valve is grossly normal.  10. The pulmonic valve was grossly normal. Pulmonic valve regurgitation is  trivial.  11. The dialysis catheter tip was not visualized in the RA.  12. No intracardiac thrombi or masses were visualized.   Medications Ordered in ED Medications  ondansetron (ZOFRAN-ODT) disintegrating tablet 8 mg (8 mg Oral Given 08/02/19 0326)    ED Course  I have reviewed the triage vital signs and the nursing notes.  Pertinent labs & imaging results that were available during my care of the patient were reviewed by me and considered in my medical decision making (see chart for details).    MDM Rules/Calculators/A&P                          Laboratory testing was done.  Head CT was done to look for evidence of stroke or even intracranial hemorrhage since patient is on Eliquis.  Concern after looking at  his EKG is that he has hyperkalemia again.  Patient is noncompliant with doing his dialysis.  After reviewing patient's head CT a called for a teleneurology consult.  I do not have MRI present at this time.  I was not sure whether he needed to be transported somewhere to get an MRI now or if I could do a CTA or wait for MRI when it arrives here later this morning.  4:16 AM Colletta Maryland from telespecialist called me back to discuss the reason for my teleneurology consult.  4:57 AM Dr. Posey Pronto, teleneurologist has evaluated patient.  He feels like patient probably has had some type of thalamic event on the right.  He does not see evidence of that on the CT.  He states we should get MRI but feels like we can wait a few hours until they arrive to the hospital.  He also recommends admission to get echo and carotid Doppler studies done to do a stroke evaluation.  Patient told him he had been compliant with his Eliquis.  5:21 AM Dr. Darrick Meigs, hospitalist will admit  Final Clinical Impression(s) / ED Diagnoses Final diagnoses:  Left sided numbness  ESRD on hemodialysis [facility])    Rx / DC Orders  Plan admission  Rolland Porter, MD, Barbette Or, MD 08/02/19 [phone]

## 2019-08-02 NOTE — Consult Note (Signed)
[facility] TeleNeurology Consult Services  Stat Consult  Date of Service:   08/02/2019 04:12:18  Impression:     .  I63.9 - Cerebrovascular accident (CVA), unspecified mechanism (Mobile)  Comments/Sign-Out: Pt is a 89 YOM who presented with complaint of left face/arm/leg paresthesia cutting the midline which started on Wednesday. CT HEAD showed hypodensity in left hemisphere which appear chronic. NIHSS: 1. Deferred TPA/CTA. Admit for TIA/STROKE workup.  CT HEAD: Showed No Acute Hemorrhage or Acute Core Infarct.  Metrics: [facility] Notification Time: 08/02/2019 04:10:36 Stamp Time: 08/02/2019 04:12:18 Callback Response Time: 08/02/2019 04:13:48  Our recommendations are outlined below.  Recommendations:     [name]  Goal SBP b/w 100-140.     .  Cont. ELIQUIS, no statin.     .  Start STATIN if no contraindications.     .  Get MRI BRAIN W/O, CAROTID US, and ECHO.     [name]  Get ESR/CRP, TROP, CK, TSH, B12, LACTIC ACID, LIPID PANEL, and A1C.     .  Get WORKUP for TOXIC/METABOLIC/INFECTIOUS causes.  Imaging Studies:     .  MRI Head Without Contrast     .  Carotid Dopplers     .  Echocardiogram - Transthoracic Echocardiogram  Therapies:     .  Physical Therapy, Occupational Therapy, Speech Therapy Assessment When Applicable  Other WorkUp:     .  Infectious/metabolic workup per primary team     .  Check an ammonia level     .  Check B12 level     .  Check TSH     .  Check Urinalysis  Disposition: Neurology Follow Up Recommended  Sign Out:     .  Discussed with Emergency Department Provider  ----------------------------------------------------------------------------------------------------  Chief Complaint: left sided numnbess/tingling cutting midline  History of Present Illness: Patient is a 49 year old Male.  Pt is a 49 YOM with PMH of HTN, HLD, DM II, ESRD on HD, PTSD, DEPRESSION, NEUROPATHY who presented with left face/arm/leg paresthesia cutting  midline which started on Wednesday. He denied any weakness, headache, vision changes, no speech/language changes, no incoordination. He had a hospital he went into cardiac arrest from hyperkalemia during last hospitalization in May 2021. He takes Eliquis and has not missed or stopped it recently. He had similar TIA events in past.   Past Medical History:     . Hypertension     . Hyperlipidemia     . Atrial Fibrillation     . Stroke     . There is NO history of Diabetes Mellitus     . There is NO history of Coronary Artery Disease  Anticoagulant use:  ELIQUIS  Antiplatelet use: No    Examination: BP(148/74), Pulse(84), Blood Glucose(95) 1A: Level of Consciousness - Alert; keenly responsive + 0 1B: Ask Month and Age - Both Questions Right + 0 1C: Blink Eyes & Squeeze Hands - Performs Both Tasks + 0 2: Test Horizontal Extraocular Movements - Normal + 0 3: Test Visual Fields - No Visual Loss + 0 4: Test Facial Palsy (Use Grimace if Obtunded) - Normal symmetry + 0 5A: Test Left Arm Motor Drift - No Drift for 10 Seconds + 0 5B: Test Right Arm Motor Drift - No Drift for 10 Seconds + 0 6A: Test Left Leg Motor Drift - No Drift for 5 Seconds + 0 6B: Test Right Leg Motor Drift - No Drift for 5 Seconds + 0 7: Test Limb Ataxia (FNF/Heel-Shin) - No Ataxia +  0 8: Test Sensation - Mild-Moderate Loss: Less Sharp/More Dull + 1 9: Test Language/Aphasia - Normal; No aphasia + 0 10: Test Dysarthria - Normal + 0 11: Test Extinction/Inattention - No abnormality + 0  NIHSS Score: 1   Patient/Family was informed the Neurology Consult would occur via TeleHealth consult by way of interactive audio and video telecommunications and consented to receiving care in this manner.  Patient is being evaluated for possible acute neurologic impairment and high probability of imminent or life-threatening deterioration. I spent total of 35 minutes providing care to this patient, including time for face to face visit  via telemedicine, review of medical records, imaging studies and discussion of findings with providers, the patient and/or family.   Dr Currie Paris   [facility] [phone]  Case [id number]

## 2019-08-02 NOTE — ED Notes (Signed)
Patient transported to MRI 

## 2019-08-02 NOTE — Progress Notes (Signed)
PT Cancellation Note  Patient Details Name: MARKE GOODWYN MRN: [id number] DOB: 05-17-1970   Cancelled Treatment:    Reason Eval/Treat Not Completed: Patient at procedure or test/unavailable.  Patient receiving dialysis   2:29 PM, 08/02/19 Lonell Grandchild, MPT Physical Therapist with [facility] [phone] office [phone] mobile phone

## 2019-08-02 NOTE — H&P (Signed)
TRH H&P    Patient Demographics:    Brendan Campbell, is a 49 y.o. male  MRN: [id number]  DOB - April 28, 1970  Admit Date - 08/02/2019  Referring MD/NP/PA: Dr. Tomi Bamberger  Outpatient Primary MD for the patient is Monico Blitz, MD  Patient coming from: Home  Chief complaint-left leg numbness   HPI:    Brendan Campbell  is a 49 y.o. male, with medical history of hypertension, hyperlipidemia, diabetes mellitus type 2, ESRD on hemodialysis, patient missed hemodialysis on Wednesday, atrial fibrillation on anticoagulation with Eliquis also supposed to be on amiodarone and has not taken for past 2 months as he did not of pills.  Today he came to the hospital with complaints of left arm, leg and face numbness which started on Wednesday.  He denies passing out.  No blurred vision.  Denies any slurred speech.  No focal weakness of extremities.  Patient takes Eliquis and feels that he may have missed 1 or 2 doses. He denies chest pain or shortness of breath. Denies nausea vomiting or diarrhea. In the ED CT head was negative Tele neurology was consulted and recommended stroke work-up including MRI brain in a.m.    Review of systems:    In addition to the HPI above,    All other systems reviewed and are negative.    Past History of the following :    Past Medical History:  Diagnosis Date  . Anemia   . Anxiety   . Blood transfusion without reported diagnosis   . CHF (congestive heart failure) ([place])   . Chronic kidney disease   . Depression   . Diabetes mellitus without complication ([place])   . End stage renal disease ([place])    M/W/F [facility]  . Hyperlipidemia   . Neuropathy   . Peripheral vascular disease ([place])   . PTSD (post-traumatic stress disorder)       Past Surgical History:  Procedure Laterality Date  . A/V FISTULAGRAM N/A 10/09/2018   Procedure: A/V FISTULAGRAM;  Surgeon: Serafina Mitchell, MD;  Location: [facility];  Service: Cardiovascular;  Laterality: N/A;  . AV FISTULA PLACEMENT Left 09/22/2016   Procedure: CREATION OF LEFT ARM ARTERIOVENOUS (AV) FISTULA;  Surgeon: Waynetta Sandy, MD;  Location: [facility];  Service: Vascular;  Laterality: Left;  . AV FISTULA PLACEMENT Left 10/31/2017   Procedure: INSERTION OF ARTERIOVENOUS (AV) GORE-TEX 4-53m STETCH GRAFT LEFT ARM;  Surgeon: CWaynetta Sandy MD;  [facility]  Service: Vascular;  Laterality: Left;  . BASCILIC VEIN TRANSPOSITION Left 08/17/2017   Procedure: SECOND STAGE BASILIC VEIN TRANSPOSITION LEFT ARM;  Surgeon: CWaynetta Sandy MD;  [facility]  Service: Vascular;  Laterality: Left;  . BELOW KNEE LEG AMPUTATION Right   . CARDIOVERSION N/A 02/19/2019   Procedure: CARDIOVERSION;  Surgeon: HPixie Casino MD;  [facility];  Service: Cardiovascular;  Laterality: N/A;  . CHOLECYSTECTOMY    . FOOT SURGERY    . IR FLUORO GUIDE CV LINE RIGHT  05/16/2016  . IR FLUORO GUIDE  CV LINE RIGHT  02/16/2019  . IR REMOVAL TUN CV CATH W/O FL  05/16/2016  . IR REMOVAL TUN CV CATH W/O FL  02/19/2019  . IR THROMBECTOMY AV FISTULA W/THROMBOLYSIS/PTA INC/SHUNT/IMG LEFT Left 11/09/2018  . IR THROMBECTOMY AV FISTULA W/THROMBOLYSIS/PTA INC/SHUNT/IMG LEFT Left 06/22/2019  . IR US GUIDE VASC ACCESS LEFT  11/09/2018  . IR US GUIDE VASC ACCESS LEFT  06/22/2019  . IR US GUIDE VASC ACCESS RIGHT  05/16/2016  . IR US GUIDE VASC ACCESS RIGHT  02/16/2019  . PERIPHERAL VASCULAR BALLOON ANGIOPLASTY  10/09/2018   Procedure: PERIPHERAL VASCULAR BALLOON ANGIOPLASTY;  Surgeon: Serafina Mitchell, MD;  Location: [facility];  Service: Cardiovascular;;  . TEE WITHOUT CARDIOVERSION N/A 02/19/2019   Procedure: TRANSESOPHAGEAL ECHOCARDIOGRAM (TEE);  Surgeon: [name], MD;  Location: [facility];  Service: Cardiovascular;  Laterality: N/A;      Social History:      Social History   Tobacco Use  . Smoking status: Former Smoker      Quit date: 09/03/2006    Years since quitting: 12.9  . Smokeless tobacco: Never Used  Substance Use Topics  . Alcohol use: No       Family History :     Family History  Problem Relation Age of Onset  . Cancer Mother        lung  . Diabetes Mother   . Heart attack Father   . Diabetes Father   . Diabetes Sister       Home Medications:   Prior to Admission medications   Medication Sig Start Date End Date Taking? Authorizing Provider  amiodarone (PACERONE) 200 MG tablet Take 1 tablet (200 mg total) by mouth daily. 02/21/19   Donnamae Jude, MD  apixaban (ELIQUIS) 5 MG TABS tablet Take 1 tablet (5 mg total) by mouth 2 (two) times daily. 02/20/19   Donnamae Jude, MD  aspirin 81 MG chewable tablet Chew 81 mg by mouth daily.    [provider]  atorvastatin (LIPITOR) 10 MG tablet Take 1 tablet (10 mg total) by mouth every evening. 02/20/19   Donnamae Jude, MD  calcium acetate (PHOSLO) 667 MG capsule Take 1 capsule (667 mg total) by mouth 3 (three) times daily with meals. 02/21/19   Donnamae Jude, MD  HYDROcodone-acetaminophen (NORCO/VICODIN) 5-325 MG tablet Take 1 tablet by mouth every 6 (six) hours as needed for moderate pain. 06/09/19   Kathie Dike, MD  LANTUS SOLOSTAR 100 UNIT/ML Solostar Pen Inject 10 Units into the skin at bedtime.  01/18/19   [provider]  lidocaine-prilocaine (EMLA) cream Apply 1 application topically See admin instructions. 05/06/19   [provider]  multivitamin (RENA-VIT) TABS tablet Take 1 tablet by mouth daily. 08/04/18   [provider]  patiromer (VELTASSA) 8.4 g packet Take 1 packet (8.4 g total) by mouth 3 (three) times a week. On Tuesday, Thursday and Saturday 06/10/19   Kathie Dike, MD  tiZANidine (ZANAFLEX) 4 MG tablet Take 4 mg by mouth daily as needed. 04/07/19   [provider]     Allergies:     Allergies  Allergen Reactions  . Tape Other (See Comments)    Pulls skin off     Physical  Exam:   Vitals  Blood pressure (!) 144/86, pulse 86, temperature 97.7 F (36.5 C), temperature source Oral, resp. rate 16, height 6' (1.829 m), weight 85.3 kg, SpO2 98 %.  1.  General: Appears in no acute  distress  2. Psychiatric: Alert, oriented x3, intact insight and judgment  3. Neurologic: Cranial nerves II through XII grossly intact, motor strength 5/5 in all extremities, sensations are reduced in left leg, left upper extremity and left side of face, DTRs 2+ bilaterally, s/p right BKA  4. HEENMT:  Atraumatic normocephalic, extra muscles are intact  5. Respiratory : Clear to auscultation bilaterally, no wheezing or crackles auscultated  6. Cardiovascular : S1-S2, regular, no murmur auscultated  7. Gastrointestinal:  Abdomen is soft, nontender, no organomegaly      Data Review:    CBC Recent Labs  Lab 08/02/19 0409  WBC 7.1  HGB 11.2*  HCT 35.8*  PLT 221  MCV 107.2*  MCH 33.5  MCHC 31.3  RDW 13.1  LYMPHSABS 1.8  MONOABS 0.6  EOSABS 0.3  BASOSABS 0.1   ------------------------------------------------------------------------------------------------------------------  Results for orders placed or performed during the hospital encounter of 08/02/19 (from the past 48 hour(s))  Ethanol     Status: None   Collection Time: 08/02/19  4:09 AM  Result Value Ref Range   Alcohol, Ethyl (B) <10 <10 mg/dL    Comment: (NOTE) Lowest detectable limit for serum alcohol is 10 mg/dL.  For medical purposes only. Performed at [facility], [address] [place], [place] [address]   Protime-INR     Status: None   Collection Time: 08/02/19  4:09 AM  Result Value Ref Range   Prothrombin Time 13.3 11.4 - 15.2 seconds   INR 1.1 0.8 - 1.2    Comment: (NOTE) INR goal varies based on device and disease states. Performed at [facility], [address], [place], [place] [address]   APTT     Status: Abnormal   Collection Time: 08/02/19  4:09 AM  Result Value Ref Range    aPTT 38 (H) 24 - 36 seconds    Comment:        IF BASELINE aPTT IS ELEVATED, SUGGEST PATIENT RISK ASSESSMENT BE USED TO DETERMINE APPROPRIATE ANTICOAGULANT THERAPY. Performed at [facility], [address], [place], [place] [address]   CBC     Status: Abnormal   Collection Time: 08/02/19  4:09 AM  Result Value Ref Range   WBC 7.1 4.0 - 10.5 K/uL   RBC 3.34 (L) 4.22 - 5.81 MIL/uL   Hemoglobin 11.2 (L) 13.0 - 17.0 g/dL   HCT 35.8 (L) 39 - 52 %   MCV 107.2 (H) 80.0 - 100.0 fL   MCH 33.5 26.0 - 34.0 pg   MCHC 31.3 30.0 - 36.0 g/dL   RDW 13.1 11.5 - 15.5 %   Platelets 221 150 - 400 K/uL   nRBC 0.0 0.0 - 0.2 %    Comment: Performed at [facility], [address], [place],  [address]  Differential     Status: None   Collection Time: 08/02/19  4:09 AM  Result Value Ref Range   Neutrophils Relative % 62 %   Neutro Abs 4.4 1.7 - 7.7 K/uL   Lymphocytes Relative 25 %   Lymphs Abs 1.8 0.7 - 4.0 K/uL   Monocytes Relative 8 %   Monocytes Absolute 0.6 0 - 1 K/uL   Eosinophils Relative 4 %   Eosinophils Absolute 0.3 0 - 0 K/uL   Basophils Relative 1 %   Basophils Absolute 0.1 0 - 0 K/uL   Immature Granulocytes 0 %   Abs Immature Granulocytes 0.02 0.00 - 0.07 K/uL    Comment: Performed at [facility], [place]  [address] [place], [place] [address]  Comprehensive metabolic panel     Status: Abnormal   Collection Time: 08/02/19  4:09 AM  Result Value Ref Range   Sodium 139 135 - 145 mmol/L   Potassium 4.4 3.5 - 5.1 mmol/L   Chloride 96 (L) 98 - 111 mmol/L   CO2 27 22 - 32 mmol/L   Glucose, Bld 84 70 - 99 mg/dL   BUN 39 (H) 6 - 20 mg/dL   Creatinine, Ser 9.93 (H) 0.61 - 1.24 mg/dL   Calcium 6.5 (L) 8.9 - 10.3 mg/dL   Total Protein 7.1 6.5 - 8.1 g/dL   Albumin 3.8 3.5 - 5.0 g/dL   AST 15 15 - 41 U/L   ALT 14 0 - 44 U/L   Alkaline Phosphatase 104 38 - 126 U/L   Total Bilirubin 0.3 0.3 - 1.2 mg/dL   GFR calc non Af Amer 6 (L) >60 mL/min   GFR calc Af Amer 6 (L) >60 mL/min   Anion  gap 16 (H) 5 - 15    Comment: Performed at [facility], [address], [place], [place] [address]    Chemistries  Recent Labs  Lab 08/02/19 0409  NA 139  K 4.4  CL 96*  CO2 27  GLUCOSE 84  BUN 39*  CREATININE 9.93*  CALCIUM 6.5*  AST 15  ALT 14  ALKPHOS 104  BILITOT 0.3   ------------------------------------------------------------------------------------------------------------------  ------------------------------------------------------------------------------------------------------------------ GFR: Estimated Creatinine Clearance: 10 mL/min (A) (by C-G formula based on SCr of 9.93 mg/dL (H)). Liver Function Tests: Recent Labs  Lab 08/02/19 0409  AST 15  ALT 14  ALKPHOS 104  BILITOT 0.3  PROT 7.1  ALBUMIN 3.8   No results for input(s): LIPASE, AMYLASE in the last 168 hours. No results for input(s): AMMONIA in the last 168 hours. Coagulation Profile: Recent Labs  Lab 08/02/19 0409  INR 1.1   Cardiac Enzymes: No results for input(s): CKTOTAL, CKMB, CKMBINDEX, TROPONINI in the last 168 hours. BNP (last 3 results) No results for input(s): PROBNP in the last 8760 hours. HbA1C: No results for input(s): HGBA1C in the last 72 hours. CBG: No results for input(s): GLUCAP in the last 168 hours. Lipid Profile: No results for input(s): CHOL, HDL, LDLCALC, TRIG, CHOLHDL, LDLDIRECT in the last 72 hours. Thyroid Function Tests: No results for input(s): TSH, T4TOTAL, FREET4, T3FREE, THYROIDAB in the last 72 hours. Anemia Panel: No results for input(s): VITAMINB12, FOLATE, FERRITIN, TIBC, IRON, RETICCTPCT in the last 72 hours.  --------------------------------------------------------------------------------------------------------------- Urine analysis: No results found for: COLORURINE, APPEARANCEUR, LABSPEC, PHURINE, GLUCOSEU, HGBUR, BILIRUBINUR, KETONESUR, PROTEINUR, UROBILINOGEN, NITRITE, LEUKOCYTESUR    Imaging Results:    CT Head Wo Contrast  Result Date:  08/02/2019 CLINICAL DATA:  Left numbness and weakness for 1 day EXAM: CT HEAD WITHOUT CONTRAST TECHNIQUE: Contiguous axial images were obtained from the base of the skull through the vertex without intravenous contrast. COMPARISON:  CT 10/05/2018 FINDINGS: Brain: No evidence of acute infarction, hemorrhage, hydrocephalus, extra-axial collection or mass lesion/mass effect. Symmetric prominence of the ventricles, cisterns and sulci compatible with parenchymal volume loss. Patchy areas of white matter hypoattenuation are most compatible with chronic microvascular angiopathy. Vascular: Atherosclerotic calcification of the carotid siphons. No hyperdense vessel. Skull: No calvarial fracture or suspicious osseous lesion. No scalp swelling or hematoma. Sinuses/Orbits: Paranasal sinuses and mastoid air cells are predominantly clear. Included orbital structures are unremarkable. Other: Mostly edentulous with mandibular prognathism similar to comparison. IMPRESSION: 1. No acute intracranial abnormality. If there is persisting  concern for acute infarction, MRI is more sensitive and specific for early features of ischemia. 2. Mild parenchymal volume loss and chronic microvascular ischemic changes. 3. Intracranial atherosclerosis. Electronically Signed   By: Lovena Le M.D.   On: 08/02/2019 03:55    My personal review of EKG: Rhythm NSR, left bundle branch block   Assessment & Plan:    Active Problems:   Left sided numbness   1. Left leg numbness-concern for thalamic stroke, neurology recommends MRI brain.  Will place under observation for stroke work-up.  Obtain MRI brain in a.m., carotid Dopplers, echocardiogram.  Will obtain hemoglobin A1c, lipid profile, TSH, ESR, CRP.  Neurology recommends to keep SBP between 100-140.  Will start hydralazine 25 mg p.o. every 6 hours as needed for SBP greater than 140.  Continue Eliquis, Lipitor. 2. ESRD on HD-patient gets dialysis Monday Wednesday Friday.  Missed dialysis on  Wednesday.  Will consult nephrology for dialysis today.  Continue PhosLo 660 mg 3 times daily 3. Diabetes mellitus type 2-continue Lantus, start sliding scale insulin with NovoLog.  Check CBG every 6 hours. 4. History of atrial fibrillation-patient has not taken amiodarone for 2 months, will restart amiodarone 200 mg daily, EKG shows normal sinus rhythm.  Eliquis has been started   DVT Prophylaxis-Eliquis  AM Labs Ordered, also please review Full Orders  Family Communication: Admission, patients condition and plan of care including tests being ordered have been discussed with the patient  who indicate understanding and agree with the plan and Code Status.  Code Status: Full code  Admission status: Observation  Time spent in minutes : 60 minutes   Jakyah Bradby S Luqman Perrelli M.D

## 2019-08-03 ENCOUNTER — Observation Stay (HOSPITAL_BASED_OUTPATIENT_CLINIC_OR_DEPARTMENT_OTHER): Payer: Medicaid Other

## 2019-08-03 DIAGNOSIS — G459 Transient cerebral ischemic attack, unspecified: Secondary | ICD-10-CM

## 2019-08-03 DIAGNOSIS — R200 Anesthesia of skin: Secondary | ICD-10-CM | POA: Diagnosis not present

## 2019-08-03 DIAGNOSIS — E1121 Type 2 diabetes mellitus with diabetic nephropathy: Secondary | ICD-10-CM | POA: Diagnosis not present

## 2019-08-03 DIAGNOSIS — N186 End stage renal disease: Secondary | ICD-10-CM | POA: Diagnosis not present

## 2019-08-03 DIAGNOSIS — I482 Chronic atrial fibrillation, unspecified: Secondary | ICD-10-CM

## 2019-08-03 DIAGNOSIS — I5042 Chronic combined systolic (congestive) and diastolic (congestive) heart failure: Secondary | ICD-10-CM

## 2019-08-03 DIAGNOSIS — E213 Hyperparathyroidism, unspecified: Secondary | ICD-10-CM

## 2019-08-03 LAB — ECHOCARDIOGRAM COMPLETE
Height: 72 in
Weight: 3333.36 oz

## 2019-08-03 LAB — LIPID PANEL
Cholesterol: 126 mg/dL (ref 0–200)
HDL: 39 mg/dL — ABNORMAL LOW (ref >40)
LDL Cholesterol: 76 mg/dL (ref 0–99)
Total CHOL/HDL Ratio: 3.2 RATIO
Triglycerides: 55 mg/dL (ref <150)
VLDL: 11 mg/dL (ref 0–40)

## 2019-08-03 LAB — GLUCOSE, CAPILLARY
Glucose-Capillary: 80 mg/dL (ref 70–99)
Glucose-Capillary: 156 mg/dL — ABNORMAL HIGH (ref 70–99)

## 2019-08-03 MED ORDER — CALCIUM CARBONATE ANTACID 500 MG PO CHEW: 800.0000 mg | CHEWABLE_TABLET | Freq: Two times a day (BID) | ORAL | 2 refills | Status: AC

## 2019-08-03 MED ORDER — CARVEDILOL 3.125 MG PO TABS
3.1250 mg | ORAL_TABLET | Freq: Two times a day (BID) | ORAL | 1 refills | Status: DC
Start: 2019-08-03 — End: 2019-08-23

## 2019-08-03 MED ORDER — ATORVASTATIN CALCIUM 10 MG PO TABS: 10.0000 mg | ORAL_TABLET | Freq: Every evening | ORAL | 2 refills | Status: AC

## 2019-08-03 MED ORDER — AMIODARONE HCL 200 MG PO TABS: 200.0000 mg | ORAL_TABLET | Freq: Every day | ORAL | 2 refills | Status: DC

## 2019-08-03 NOTE — Progress Notes (Signed)
*  PRELIMINARY RESULTS* Echocardiogram 2D Echocardiogram has been performed.  Leavy Cella 08/03/2019, 10:23 AM

## 2019-08-03 NOTE — Discharge Summary (Signed)
Physician Discharge Summary  MELVILLE ENGEN WUX:[id number] DOB: 25-Mar-1970 DOA: 08/02/2019  PCP: Monico Blitz, MD  Admit date: 08/02/2019 Discharge date: 08/03/2019  Time spent: 35 minutes  Recommendations for Outpatient Follow-up:  1. Repeat basic metabolic panel to follow electrolytes (especially calcium level). 2. Make sure patient has been compliant with medications, dialysis and outpatient follow-up visit with cardiology service. 3. Reassess patient CBGs and further adjust hypoglycemic therapy.   Discharge Diagnoses:  Active Problems:   Type 2 diabetes with nephropathy (HCC)   Left sided numbness   Hypocalcemia   Atrial fibrillation, chronic ([place])   Hyperparathyroidism ([place]) Chronic combined CHF  Discharge Condition: Stable and improved.  Discharged home with instruction to follow-up with PCP, cardiology and to be compliant with hemodialysis and medication management.  CODE STATUS: Full code.  Diet recommendation: heart healthy diet  Filed Weights   08/02/19 0306 08/02/19 1724  Weight: 85.3 kg 94.5 kg    History of present illness:  As per H&P written by Dr. Darrick Meigs on 08/02/2019 Dandrae Kustra  is a 49 y.o. male, with medical history of hypertension, hyperlipidemia, diabetes mellitus type 2, ESRD on hemodialysis, patient missed hemodialysis on Wednesday, atrial fibrillation on anticoagulation with Eliquis also supposed to be on amiodarone and has not taken for past 2 months as he did not of pills.  Today he came to the hospital with complaints of left arm, leg and face numbness which started on Wednesday.  He denies passing out.  No blurred vision.  Denies any slurred speech.  No focal weakness of extremities.  Patient takes Eliquis and feels that he may have missed 1 or 2 doses. He denies chest pain or shortness of breath. Denies nausea vomiting or diarrhea. In the ED CT head was negative Tele neurology was consulted and recommended stroke work-up including MRI brain in  a.m.  Hospital Course:  1-TIA -Work-up for acute stroke negative -Patient symptoms improved/resolved by time of discharge -No focal deficits. -Continue risk factor modifications -Continue the use of baby aspirin and Eliquis for secondary prevention.  2-hypocalcemia/hypoparathyroidism -Patient will continue the use of PhosLo -Advised to be compliant with hemodialysis management -Started on Tums twice daily as per nephrology recommendations. -Repeat blood work at follow-up visit.  3-type 2 diabetes with nephropathy -Stable and well-controlled -A1c demonstrated to be 5.4 -Continue low-dose daily Lantus as previously prescribed -Continue outpatient follow-up and monitoring with future decisions to discontinue insulin therapy if appropriate.  4-history of atrial fibrillation -Continue amiodarone -Continue Eliquis for secondary prevention -Rate controlled.  5-chronic combined CHF -Ejection fraction 20 to 25% (unchanged) -Compensated -Continue volume management by dialysis -Heart healthy diet has been encouraged -Instructed to follow his weight on daily basis. -Low-dose carvedilol has been started. -Continue outpatient follow-up with cardiology service as instructed.  6-hyperlipidemia -Continue statins.  Procedures: See below for x-ray reports 2D echo: 1. Left ventricular ejection fraction, by estimation, is approximately  25%. The left ventricle has severely decreased function. The left  ventricle demonstrates regional wall motion abnormalities (see scoring  diagram/findings for description). There is  moderate left ventricular hypertrophy. Left ventricular diastolic  parameters are consistent with Grade I diastolic dysfunction (impaired  relaxation).  2. Right ventricular systolic function is normal. The right ventricular  size is normal. Tricuspid regurgitation signal is inadequate for assessing  PA pressure.  3. The mitral valve is grossly normal, mildly calcified  annulus. Trivial  mitral valve regurgitation.  4. The aortic valve is tricuspid. Aortic valve regurgitation is not  visualized. Mild  aortic valve sclerosis is present, with no evidence of  aortic valve stenosis.  5. The inferior vena cava is normal in size with greater than 50%  respiratory variability, suggesting right atrial pressure of 3 mmHg.    Consultations:  Nephrology service  Discharge Exam: Vitals:   08/03/19 0930 08/03/19 1330  BP: (!) 144/76 (!) 152/84  Pulse: 78 80  Resp: 16 18  Temp: 98.1 F (36.7 C) 98 F (36.7 C)  SpO2: 99% 98%    General: Afebrile, no chest pain, no shortness of breath, no nausea or vomiting.  Reports left numbness/weakness resolved at this moment.  No other neurologic deficits appreciated. Cardiovascular: S1 and S2, no rubs, no gallops, no JVD. Respiratory: Clear to auscultation bilaterally. Abdomen: Soft, nontender, nondistended, positive bowel sounds Extremities: Right BKA, no cyanosis or clubbing.  Discharge Instructions   Discharge Instructions    Diet - low sodium heart healthy   Complete by: As directed    Discharge instructions   Complete by: As directed    Be compliant and take medications as prescribed Follow heart healthy and modified carb diet Maintain adequate hydration Arrange follow up with PCP in 10 days Be compliant with your HD therapy, next treatment on 08/05/19     Allergies as of 08/03/2019      Reactions   Tape Other (See Comments)   Pulls skin off      Medication List    STOP taking these medications   tiZANidine 4 MG tablet Commonly known as: ZANAFLEX     TAKE these medications   amiodarone 200 MG tablet Commonly known as: PACERONE Take 1 tablet (200 mg total) by mouth daily.   apixaban 5 MG Tabs tablet Commonly known as: ELIQUIS Take 1 tablet (5 mg total) by mouth 2 (two) times daily. What changed: when to take this   aspirin 81 MG chewable tablet Chew 81 mg by mouth daily.    atorvastatin 10 MG tablet Commonly known as: LIPITOR Take 1 tablet (10 mg total) by mouth every evening.   calcium acetate 667 MG capsule Commonly known as: PHOSLO Take 1 capsule (667 mg total) by mouth 3 (three) times daily with meals.   calcium carbonate 500 MG chewable tablet Commonly known as: TUMS - dosed in mg elemental calcium Chew 4 tablets (800 mg of elemental calcium total) by mouth 2 (two) times daily.   carvedilol 3.125 MG tablet Commonly known as: Coreg Take 1 tablet (3.125 mg total) by mouth 2 (two) times daily with a meal.   Lantus SoloStar 100 UNIT/ML Solostar Pen Generic drug: insulin glargine Inject 10 Units into the skin at bedtime.   lidocaine-prilocaine cream Commonly known as: EMLA Apply 1 application topically See admin instructions. Applied three times weekly at dialysis on MWF   multivitamin Tabs tablet Take 1 tablet by mouth daily.   Veltassa 8.4 g packet Generic drug: patiromer Take 1 packet (8.4 g total) by mouth 3 (three) times a week. On Tuesday, Thursday and Saturday      Allergies  Allergen Reactions  . Tape Other (See Comments)    Pulls skin off    Follow-up Information    Monico Blitz, MD. Schedule an appointment as soon as possible for a visit in 10 day(s).   Specialty: Internal Medicine Contact information: [place] [address] [phone]        Arnoldo Lenis, MD .   Specialty: Cardiology Contact information: [address] [place] [address] [phone]  The results of significant diagnostics from this hospitalization (including imaging, microbiology, ancillary and laboratory) are listed below for reference.    Significant Diagnostic Studies: CT Head Wo Contrast  Result Date: 08/02/2019 CLINICAL DATA:  Left numbness and weakness for 1 day EXAM: CT HEAD WITHOUT CONTRAST TECHNIQUE: Contiguous axial images were obtained from the base of the skull through the vertex without  intravenous contrast. COMPARISON:  CT 10/05/2018 FINDINGS: Brain: No evidence of acute infarction, hemorrhage, hydrocephalus, extra-axial collection or mass lesion/mass effect. Symmetric prominence of the ventricles, cisterns and sulci compatible with parenchymal volume loss. Patchy areas of white matter hypoattenuation are most compatible with chronic microvascular angiopathy. Vascular: Atherosclerotic calcification of the carotid siphons. No hyperdense vessel. Skull: No calvarial fracture or suspicious osseous lesion. No scalp swelling or hematoma. Sinuses/Orbits: Paranasal sinuses and mastoid air cells are predominantly clear. Included orbital structures are unremarkable. Other: Mostly edentulous with mandibular prognathism similar to comparison. IMPRESSION: 1. No acute intracranial abnormality. If there is persisting concern for acute infarction, MRI is more sensitive and specific for early features of ischemia. 2. Mild parenchymal volume loss and chronic microvascular ischemic changes. 3. Intracranial atherosclerosis. Electronically Signed   By: Lovena Le M.D.   On: 08/02/2019 03:55   MR BRAIN WO CONTRAST  Result Date: 08/02/2019 CLINICAL DATA:  Focal neuro deficit, greater than 6 hours, stroke suspected. Additional provided: Patient reports left arm, leg and face numbness which began on Wednesday. EXAM: MRI HEAD WITHOUT CONTRAST TECHNIQUE: Multiplanar, multiecho pulse sequences of the brain and surrounding structures were obtained without intravenous contrast. COMPARISON:  Head CT 08/02/2019, head CT 10/05/2018 FINDINGS: Brain: Cerebral volume is normal for age. Patchy T2/FLAIR hyperintensity within the cerebral white matter is advanced for age and nonspecific, but likely reflect sequela of chronic small vessel ischemic disease given the history of diabetes, hyperlipidemia and peripheral vascular disease. There is no acute infarct. No evidence of intracranial mass. No chronic intracranial blood  products. No extra-axial fluid collection. No midline shift. Vascular: Expected proximal arterial flow voids. Skull and upper cervical spine: No focal marrow lesion. Sinuses/Orbits: Visualized orbits show no acute finding. Trace ethmoid and maxillary sinus mucosal thickening. No significant mastoid effusion. IMPRESSION: No evidence of acute intracranial abnormality. Patchy T2 hyperintense signal changes in the cerebral white matter are advanced for age and nonspecific, but likely reflect sequela of chronic small vessel ischemic disease given the history of diabetes, hyperlipidemia and peripheral vascular disease. Electronically Signed   By: Kellie Simmering DO   On: 08/02/2019 07:42   US Carotid Bilateral (at [facility] and [facility] only)  Result Date: 08/02/2019 CLINICAL DATA:  Left-sided numbness, hyperlipidemia EXAM: BILATERAL CAROTID DUPLEX ULTRASOUND TECHNIQUE: Pearline Cables scale imaging, color Doppler and duplex ultrasound were performed of bilateral carotid and vertebral arteries in the neck. COMPARISON:  None. FINDINGS: Criteria: Quantification of carotid stenosis is based on velocity parameters that correlate the residual internal carotid diameter with NASCET-based stenosis levels, using the diameter of the distal internal carotid lumen as the denominator for stenosis measurement. The following velocity measurements were obtained: RIGHT ICA: 86/31 cm/sec CCA: 53/64 cm/sec SYSTOLIC ICA/CCA RATIO:  6.80 ECA: 94 cm/sec LEFT ICA: 88/28 cm/sec CCA: 32/12 cm/sec SYSTOLIC ICA/CCA RATIO:  0.9 ECA: 97 cm/sec RIGHT CAROTID ARTERY: Mild echogenic shadowing plaque formation. No hemodynamically significant right ICA stenosis, velocity elevation, or turbulent flow. Degree of narrowing less than 50%. RIGHT VERTEBRAL ARTERY:  Normal antegrade flow LEFT CAROTID ARTERY: Similar scattered mild echogenic plaque formation. No hemodynamically significant left ICA stenosis, velocity  elevation, or turbulent flow. LEFT VERTEBRAL ARTERY:  Normal  antegrade flow IMPRESSION: Mild bilateral carotid atherosclerosis. No hemodynamically significant ICA stenosis. Degree of narrowing less than 50% bilaterally by ultrasound criteria. Patent antegrade vertebral flow bilaterally Electronically Signed   By: Jerilynn Mages.  Shick M.D.   On: 08/02/2019 08:56   DG Chest Port 1 View  Result Date: 08/02/2019 CLINICAL DATA:  Chest pain.  Missed dialysis. EXAM: PORTABLE CHEST 1 VIEW COMPARISON:  One-view chest x-ray 06/07/2019 FINDINGS: The heart size is mildly enlarged. Atherosclerotic changes are noted at the aortic arch. The lungs lungs are clear. No edema or effusion is present. No focal airspace consolidation is present. IMPRESSION: Mild cardiomegaly without failure.  Flu Electronically Signed   By: San Morelle M.D.   On: 08/02/2019 06:57   ECHOCARDIOGRAM COMPLETE  Result Date: 08/03/2019    ECHOCARDIOGRAM REPORT   Patient Name:   EDDISON SEARLS Date of Exam: 08/03/2019 Medical Rec #:  [id number]      Height:       72.0 in Accession #:    [id number]     Weight:       208.3 lb Date of Birth:  1970-07-02       BSA:          2.168 m Patient Age:    12 years       BP:           144/76 mmHg Patient Gender: M              HR:           78 bpm. Exam Location:  [facility] Procedure: 2D Echo Indications:    TIA 435.9 / G45.9  History:        Patient has prior history of Echocardiogram examinations, most                 recent 02/19/2019. CHF, Arrythmias:Atrial Flutter,                 Signs/Symptoms:Chest Pain; Risk Factors:Dyslipidemia and                 Diabetes. Cardiac Arrest, ESRD.  Sonographer:    Leavy Cella RDCS (AE) Referring Phys: [place]  1. Left ventricular ejection fraction, by estimation, is approximately 25%. The left ventricle has severely decreased function. The left ventricle demonstrates regional wall motion abnormalities (see scoring diagram/findings for description). There is moderate left ventricular hypertrophy. Left ventricular  diastolic parameters are consistent with Grade I diastolic dysfunction (impaired relaxation).  2. Right ventricular systolic function is normal. The right ventricular size is normal. Tricuspid regurgitation signal is inadequate for assessing PA pressure.  3. The mitral valve is grossly normal, mildly calcified annulus. Trivial mitral valve regurgitation.  4. The aortic valve is tricuspid. Aortic valve regurgitation is not visualized. Mild aortic valve sclerosis is present, with no evidence of aortic valve stenosis.  5. The inferior vena cava is normal in size with greater than 50% respiratory variability, suggesting right atrial pressure of 3 mmHg. FINDINGS  Left Ventricle: Left ventricular ejection fraction, by estimation, is 25%. The left ventricle has severely decreased function. The left ventricle demonstrates regional wall motion abnormalities. The left ventricular internal cavity size was normal in size. There is moderate left ventricular hypertrophy. Left ventricular diastolic parameters are consistent with Grade I diastolic dysfunction (impaired relaxation).  LV Wall Scoring: The mid and distal inferior wall, mid anteroseptal segment, and mid inferoseptal segment are akinetic.  The mid and distal anterior wall, mid and distal lateral wall, basal anteroseptal segment, mid anterolateral segment, apical septal segment, basal inferior segment, basal inferoseptal segment, and apex are hypokinetic. The basal inferolateral segment, basal anterolateral segment, and basal anterior segment are normal. Right Ventricle: The right ventricular size is normal. No increase in right ventricular wall thickness. Right ventricular systolic function is normal. Tricuspid regurgitation signal is inadequate for assessing PA pressure. Left Atrium: Left atrial size was normal in size. Right Atrium: Right atrial size was normal in size. Pericardium: Trivial pericardial effusion is present. The pericardial effusion is circumferential.  Mitral Valve: The mitral valve is grossly normal. Mild mitral annular calcification. Trivial mitral valve regurgitation. Tricuspid Valve: The tricuspid valve is grossly normal. Tricuspid valve regurgitation is trivial. Aortic Valve: The aortic valve is tricuspid. Aortic valve regurgitation is not visualized. Mild aortic valve sclerosis is present, with no evidence of aortic valve stenosis. Mild aortic valve annular calcification. Pulmonic Valve: The pulmonic valve was grossly normal. Pulmonic valve regurgitation is trivial. Aorta: The aortic root is normal in size and structure. Venous: The inferior vena cava is normal in size with greater than 50% respiratory variability, suggesting right atrial pressure of 3 mmHg. IAS/Shunts: No atrial level shunt detected by color flow Doppler.  LEFT VENTRICLE PLAX 2D LVIDd:         4.60 cm  Diastology LVIDs:         3.39 cm  LV e' lateral:   6.20 cm/s LV PW:         1.56 cm  LV E/e' lateral: 13.5 LV IVS:        1.62 cm  LV e' medial:    3.15 cm/s LVOT diam:     2.20 cm  LV E/e' medial:  26.5 LVOT Area:     3.80 cm  RIGHT VENTRICLE RV S prime:     11.20 cm/s TAPSE (M-mode): 2.1 cm LEFT ATRIUM           Index       RIGHT ATRIUM           Index LA diam:      4.10 cm 1.89 cm/m  RA Area:     14.30 cm LA Vol (A2C): 34.7 ml 16.01 ml/m RA Volume:   39.40 ml  18.17 ml/m LA Vol (A4C): 65.3 ml 30.12 ml/m   AORTA Ao Root diam: 3.10 cm MITRAL VALVE MV Area (PHT): 4.17 cm     SHUNTS MV Decel Time: 182 msec     Systemic Diam: 2.20 cm MV E velocity: 83.40 cm/s MV A velocity: 108.00 cm/s MV E/A ratio:  0.77 Rozann Lesches MD Electronically signed by Rozann Lesches MD Signature Date/Time: 08/03/2019/1:35:58 PM    Final     Microbiology: Recent Results (from the past 240 hour(s))  SARS Coronavirus 2 by RT PCR (hospital order, performed in [place] hospital lab) Nasopharyngeal Nasopharyngeal Swab     Status: None   Collection Time: 08/02/19  5:19 AM   Specimen: Nasopharyngeal Swab   Result Value Ref Range Status   SARS Coronavirus 2 NEGATIVE NEGATIVE Final    Comment: (NOTE) SARS-CoV-2 target nucleic acids are NOT DETECTED.  The SARS-CoV-2 RNA is generally detectable in upper and lower respiratory specimens during the acute phase of infection. The lowest concentration of SARS-CoV-2 viral copies this assay can detect is 250 copies / mL. A negative result does not preclude SARS-CoV-2 infection and should not be used as the sole basis for treatment  or other patient management decisions.  A negative result may occur with improper specimen collection / handling, submission of specimen other than nasopharyngeal swab, presence of viral mutation(s) within the areas targeted by this assay, and inadequate number of viral copies (<250 copies / mL). A negative result must be combined with clinical observations, patient history, and epidemiological information.  Fact Sheet for Patients:   StrictlyIdeas.no  Fact Sheet for Healthcare Providers: BankingDealers.co.za  This test is not yet approved or  cleared by the [place] FDA and has been authorized for detection and/or diagnosis of SARS-CoV-2 by FDA under an Emergency Use Authorization (EUA).  This EUA will remain in effect (meaning this test can be used) for the duration of the COVID-19 declaration under Section 564(b)(1) of the Act, 21 U.S.C. section 360bbb-3(b)(1), unless the authorization is terminated or revoked sooner.  Performed at [facility], [address] [place],  [address]      Labs: Basic Metabolic Panel: Recent Labs  Lab 08/02/19 0409  NA 139  K 4.4  CL 96*  CO2 27  GLUCOSE 84  BUN 39*  CREATININE 9.93*  CALCIUM 6.5*   Liver Function Tests: Recent Labs  Lab 08/02/19 0409  AST 15  ALT 14  ALKPHOS 104  BILITOT 0.3  PROT 7.1  ALBUMIN 3.8   CBC: Recent Labs  Lab 08/02/19 0409  WBC 7.1  NEUTROABS 4.4  HGB 11.2*  HCT 35.8*   MCV 107.2*  PLT 221   BNP (last 3 results) Recent Labs    02/15/19 0500 05/16/19 0814 06/07/19 1125  BNP 3,499.0* 928.0* 3,441.0*    CBG: Recent Labs  Lab 08/02/19 1154 08/02/19 1852 08/02/19 2330 08/03/19 0537 08/03/19 1123  GLUCAP 74 131* 94 80 156*    Signed:  Barton Dubois MD.  [facility] 08/03/2019, 2:46 PM

## 2019-08-03 NOTE — Progress Notes (Signed)
Patient states understanding of discharge instructions.  

## 2019-08-07 ENCOUNTER — Emergency Department (HOSPITAL_COMMUNITY): Payer: Medicaid Other

## 2019-08-07 ENCOUNTER — Emergency Department (HOSPITAL_COMMUNITY)
Admission: EM | Admit: 2019-08-07 | Discharge: 2019-08-07 | Disposition: A | Discharge status: Home / Self Care | Payer: Medicaid Other | Attending: Emergency Medicine | Admitting: Emergency Medicine

## 2019-08-07 ENCOUNTER — Encounter (HOSPITAL_COMMUNITY): Payer: Self-pay | Admitting: *Deleted

## 2019-08-07 ENCOUNTER — Other Ambulatory Visit: Payer: Self-pay

## 2019-08-07 DIAGNOSIS — N186 End stage renal disease: Secondary | ICD-10-CM | POA: Insufficient documentation

## 2019-08-07 DIAGNOSIS — R079 Chest pain, unspecified: Secondary | ICD-10-CM | POA: Diagnosis not present

## 2019-08-07 DIAGNOSIS — Z7901 Long term (current) use of anticoagulants: Secondary | ICD-10-CM | POA: Diagnosis not present

## 2019-08-07 DIAGNOSIS — R0602 Shortness of breath: Secondary | ICD-10-CM | POA: Insufficient documentation

## 2019-08-07 DIAGNOSIS — Z992 Dependence on renal dialysis: Secondary | ICD-10-CM | POA: Diagnosis not present

## 2019-08-07 DIAGNOSIS — Z7982 Long term (current) use of aspirin: Secondary | ICD-10-CM | POA: Insufficient documentation

## 2019-08-07 DIAGNOSIS — R112 Nausea with vomiting, unspecified: Secondary | ICD-10-CM | POA: Insufficient documentation

## 2019-08-07 DIAGNOSIS — Z79899 Other long term (current) drug therapy: Secondary | ICD-10-CM | POA: Insufficient documentation

## 2019-08-07 DIAGNOSIS — F17200 Nicotine dependence, unspecified, uncomplicated: Secondary | ICD-10-CM | POA: Insufficient documentation

## 2019-08-07 DIAGNOSIS — I509 Heart failure, unspecified: Secondary | ICD-10-CM | POA: Insufficient documentation

## 2019-08-07 DIAGNOSIS — E1121 Type 2 diabetes mellitus with diabetic nephropathy: Secondary | ICD-10-CM | POA: Diagnosis not present

## 2019-08-07 DIAGNOSIS — E1122 Type 2 diabetes mellitus with diabetic chronic kidney disease: Secondary | ICD-10-CM | POA: Diagnosis not present

## 2019-08-07 DIAGNOSIS — E1140 Type 2 diabetes mellitus with diabetic neuropathy, unspecified: Secondary | ICD-10-CM | POA: Diagnosis not present

## 2019-08-07 DIAGNOSIS — I132 Hypertensive heart and chronic kidney disease with heart failure and with stage 5 chronic kidney disease, or end stage renal disease: Secondary | ICD-10-CM | POA: Diagnosis not present

## 2019-08-07 LAB — COMPREHENSIVE METABOLIC PANEL
ALT: 13 U/L (ref 0–44)
AST: 19 U/L (ref 15–41)
Albumin: 3.8 g/dL (ref 3.5–5.0)
Alkaline Phosphatase: 104 U/L (ref 38–126)
Anion gap: 15 (ref 5–15)
BUN: 59 mg/dL — ABNORMAL HIGH (ref 6–20)
CO2: 26 mmol/L (ref 22–32)
Calcium: 7.1 mg/dL — ABNORMAL LOW (ref 8.9–10.3)
Chloride: 100 mmol/L (ref 98–111)
Creatinine, Ser: 8.96 mg/dL — ABNORMAL HIGH (ref 0.61–1.24)
GFR calc Af Amer: 7 mL/min — ABNORMAL LOW (ref >60)
GFR calc non Af Amer: 6 mL/min — ABNORMAL LOW (ref >60)
Glucose, Bld: 87 mg/dL (ref 70–99)
Potassium: 5 mmol/L (ref 3.5–5.1)
Sodium: 141 mmol/L (ref 135–145)
Total Bilirubin: 0.7 mg/dL (ref 0.3–1.2)
Total Protein: 7 g/dL (ref 6.5–8.1)

## 2019-08-07 LAB — CBC
HCT: 38.2 % — ABNORMAL LOW (ref 39.0–52.0)
Hemoglobin: 12 g/dL — ABNORMAL LOW (ref 13.0–17.0)
MCH: 33.6 pg (ref 26.0–34.0)
MCHC: 31.4 g/dL (ref 30.0–36.0)
MCV: 107 fL — ABNORMAL HIGH (ref 80.0–100.0)
Platelets: 193 10*3/uL (ref 150–400)
RBC: 3.57 MIL/uL — ABNORMAL LOW (ref 4.22–5.81)
RDW: 12.9 % (ref 11.5–15.5)
WBC: 11.4 10*3/uL — ABNORMAL HIGH (ref 4.0–10.5)
nRBC: 0 % (ref 0.0–0.2)

## 2019-08-07 LAB — TROPONIN I (HIGH SENSITIVITY)
Troponin I (High Sensitivity): 25 ng/L — ABNORMAL HIGH (ref <18)
Troponin I (High Sensitivity): 25 ng/L — ABNORMAL HIGH (ref <18)

## 2019-08-07 IMAGING — DX DG CHEST 1V PORT
1 series · 1 of 1 positions shown · non-contrast
Comparison: [DATE]

CLINICAL DATA: Chest pain.  Left arm pain and numbness.

EXAM:
PORTABLE CHEST 1 VIEW

[chest ap]
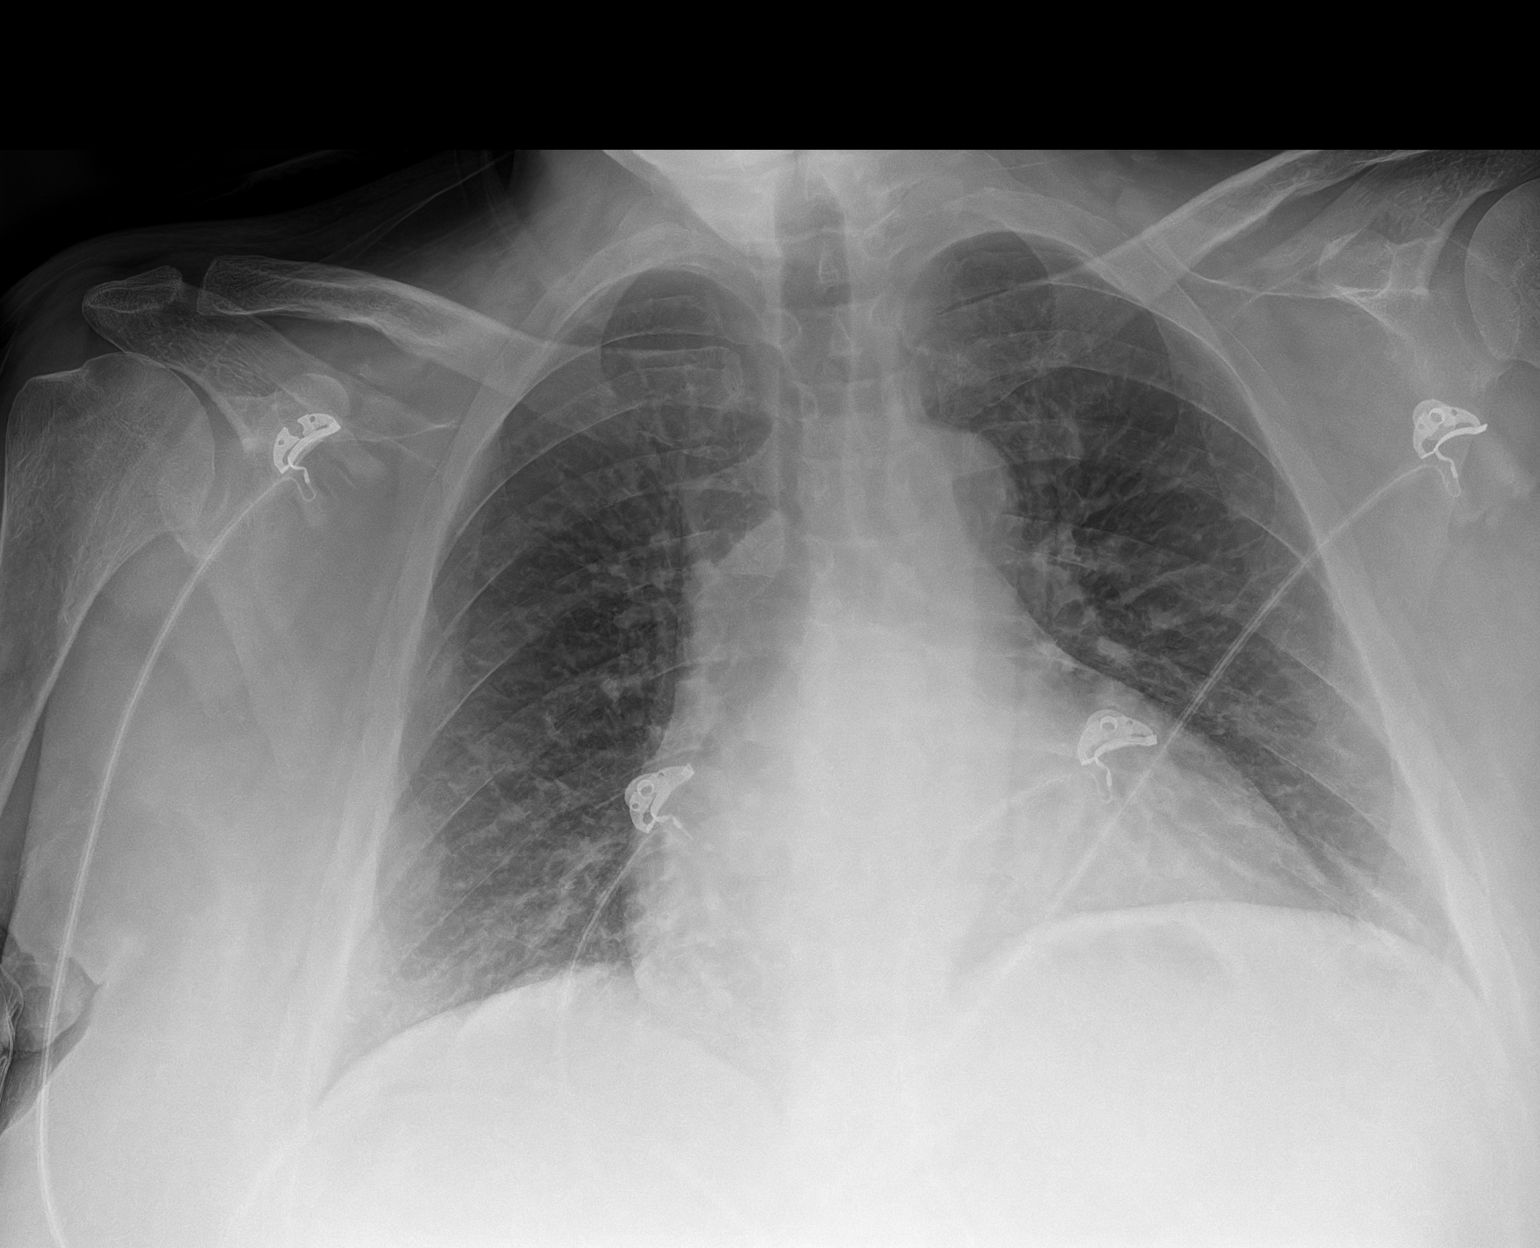

[1 of 1 positions shown; findings below may reference images not displayed]

FINDINGS: Heart size is normal. No pleural effusion or edema. No airspace
consolidation identified. The visualized osseous structures are
unremarkable.
IMPRESSION: No active disease.

## 2019-08-07 MED ORDER — CALCIUM GLUCONATE-NACL 1-0.675 GM/50ML-% IV SOLN
1.0000 g | Freq: Once | INTRAVENOUS | Status: AC
  Administered 2019-08-07: 1000 mg via INTRAVENOUS
  Filled 2019-08-07: qty 50

## 2019-08-07 MED ORDER — ASPIRIN 81 MG PO CHEW
162.0000 mg | CHEWABLE_TABLET | Freq: Once | ORAL | Status: AC
  Administered 2019-08-07: 162 mg via ORAL
  Filled 2019-08-07: qty 2

## 2019-08-07 MED ORDER — NITROGLYCERIN 0.4 MG SL SUBL
0.4000 mg | SUBLINGUAL_TABLET | SUBLINGUAL | Status: DC | PRN
  Administered 2019-08-07 (×2): 0.4 mg via SUBLINGUAL
  Filled 2019-08-07: qty 1

## 2019-08-07 NOTE — ED Notes (Signed)
Pt in bathroom at this time.

## 2019-08-07 NOTE — ED Triage Notes (Signed)
Pt brought in by [facility] from home with c/o sharp chest pain radiating to left arm with left arm numbness along with nausea. HX COPD, kidney failure, dialysis. 324mg  ASA & 1 nitro given by EMS with relief of pain from 8 to a 5. CBG 89 by EMS.

## 2019-08-07 NOTE — ED Notes (Signed)
Called [facility] in [place] to get pt an appointment for dialysis, first available is tomorrow at 1230, called [facility] to get pt a ride to dialysis, pt is set up with a ride for tomorrow between 1030 to 11 to get him to his appointment at 1230, PA notified, states that this is sufficient.  PT notified of his appointment for tomorrow also informed pt of his cards appointment July 16th at 2pm.  Pt states that he now needs a ride home, explained that he was welcome to call anyone he would like for a ride home, pt state that he would like to talk with my supervisor,  Reviewed remaining d/c instructions with pt, d/c pt iv, charge rn to bedside, pt from dpt in [place].

## 2019-08-07 NOTE — Discharge Instructions (Addendum)
Continue taking your PhosLo and your extra Tums to help supplement your calcium level.  Your lab tests today are stable except for your low calcium.  You should proceed to dialysis at this time.  As discussed Dr. Nelly Laurence office will contact you for an office visit for further evaluation of your chest pain symptoms.

## 2019-08-07 NOTE — ED Provider Notes (Signed)
[facility] EMERGENCY DEPARTMENT Provider Note   CSN: [id number] Arrival date & time: 08/07/19  8413     History Chief Complaint  Patient presents with  . Chest Pain    Brendan Campbell is a 49 y.o. male with a history of CHF, end-stage renal disease on dialysis, atrial flutter,  diabetes and hyperlipidemia presenting with chest pain.  He describes sharp left-sided chest pain that is constant and radiates into his left arm along with left arm numbness, nausea with emesis x 1.  His sx started around 7 am when he was in the shower getting ready for dialysis. He states he was told this sx was related to hypocalcemia and was recently advised with admission several days ago to add 2 Tums tablets daily which he has been taking, he also takes Phoslo tid.  He arrives by EMS and has received aspirin 324 mg, but states he had to spit them out as he was unable to chew them.  He did take 2 baby asa when he first woke this am.  He was also given 1 nitroglycerin tablet which improved his pain from an 8 to a 5.  He does endorse some mild sob but states has been since his Monday dialysis, stating he left there 2 lbs over his dry weight.   The history is provided by the patient.    HPI: A 49 year old patient with a history of peripheral artery disease, treated diabetes, hypertension and hypercholesterolemia presents for evaluation of chest pain. Initial onset of pain was approximately 1-3 hours ago. The patient's chest pain is sharp, is not worse with exertion and is relieved by nitroglycerin. The patient complains of nausea. The patient's chest pain is middle- or left-sided, is not well-localized, is not described as heaviness/pressure/tightness and does radiate to the arms/jaw/neck. The patient denies diaphoresis. The patient has no history of stroke, has not smoked in the past 90 days, has no relevant family history of coronary artery disease (first degree relative at less than age 31) and does not have an  elevated BMI (>=30).   Past Medical History:  Diagnosis Date  . Anemia   . Anxiety   . Blood transfusion without reported diagnosis   . CHF (congestive heart failure) ([place])   . Chronic kidney disease   . Depression   . Diabetes mellitus without complication ([place])   . End stage renal disease ([place])    M/W/F [facility]  . Hyperlipidemia   . Neuropathy   . Peripheral vascular disease ([place])   . PTSD (post-traumatic stress disorder)     Patient Active Problem List   Diagnosis Date Noted  . Hypocalcemia   . Atrial fibrillation, chronic ([place])   . Hyperparathyroidism ([place])   . Left sided numbness 08/02/2019  . Cardiac arrest ([place]) 06/07/2019  . Atrial flutter ([place])   . CHF (congestive heart failure) ([place])   . Neuropathy   . Peripheral vascular disease ([place])   . Abnormal EKG   . Leukocytosis   . Atypical pneumonia   . ESRD needing dialysis ([place])   . Acute pulmonary edema (HCC)   . ESRD on hemodialysis ([place])   . Hyperkalemia 10/05/2018  . Weakness 10/05/2018  . Chest pain 05/10/2016  . Anemia due to end stage renal disease ([place]) 05/10/2016  . 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 Lumbar radiculopathy 03/08/2016  . S/P below knee amputation, right ([place]) 03/05/2015  . Type 2 diabetes with nephropathy ([place]) 03/03/2015  . HLD (hyperlipidemia) 03/03/2015  Past Surgical History:  Procedure Laterality Date  . A/V FISTULAGRAM N/A 10/09/2018   Procedure: A/V FISTULAGRAM;  Surgeon: Serafina Mitchell, MD;  Location: [place] [facility];  Service: Cardiovascular;  Laterality: N/A;  . AV FISTULA PLACEMENT Left 09/22/2016   Procedure: CREATION OF LEFT ARM ARTERIOVENOUS (AV) FISTULA;  Surgeon: Waynetta Sandy, MD;  Location: [place];  Service: Vascular;  Laterality: Left;  . AV FISTULA PLACEMENT Left 10/31/2017   Procedure: INSERTION OF ARTERIOVENOUS (AV) GORE-TEX 4-89mm STETCH GRAFT LEFT ARM;  Surgeon: Waynetta Sandy, MD;  Location: [place];  Service: Vascular;   Laterality: Left;  . BASCILIC VEIN TRANSPOSITION Left 08/17/2017   Procedure: SECOND STAGE BASILIC VEIN TRANSPOSITION LEFT ARM;  Surgeon: Waynetta Sandy, MD;  Location: [place];  Service: Vascular;  Laterality: Left;  . BELOW KNEE LEG AMPUTATION Right   . CARDIOVERSION N/A 02/19/2019   Procedure: CARDIOVERSION;  Surgeon: [name], MD;  Location: [facility] ENDOSCOPY;  Service: Cardiovascular;  Laterality: N/A;  . CHOLECYSTECTOMY    . FOOT SURGERY    . IR FLUORO GUIDE CV LINE RIGHT  05/16/2016  . IR FLUORO GUIDE CV LINE RIGHT  02/16/2019  . IR REMOVAL TUN CV CATH W/O FL  05/16/2016  . IR REMOVAL TUN CV CATH W/O FL  02/19/2019  . IR THROMBECTOMY AV FISTULA W/THROMBOLYSIS/PTA INC/SHUNT/IMG LEFT Left 11/09/2018  . IR THROMBECTOMY AV FISTULA W/THROMBOLYSIS/PTA INC/SHUNT/IMG LEFT Left 06/22/2019  . IR US GUIDE VASC ACCESS LEFT  11/09/2018  . IR US GUIDE VASC ACCESS LEFT  06/22/2019  . IR US GUIDE VASC ACCESS RIGHT  05/16/2016  . IR US GUIDE VASC ACCESS RIGHT  02/16/2019  . PERIPHERAL VASCULAR BALLOON ANGIOPLASTY  10/09/2018   Procedure: PERIPHERAL VASCULAR BALLOON ANGIOPLASTY;  Surgeon: Serafina Mitchell, MD;  Location: [place] [facility];  Service: Cardiovascular;;  . TEE WITHOUT CARDIOVERSION N/A 02/19/2019   Procedure: TRANSESOPHAGEAL ECHOCARDIOGRAM (TEE);  Surgeon: [name], MD;  Location: [facility] ENDOSCOPY;  Service: Cardiovascular;  Laterality: N/A;       Family History  Problem Relation Age of Onset  . Cancer Mother        lung  . Diabetes Mother   . Heart attack Father   . Diabetes Father   . Diabetes Sister     Social History   Tobacco Use  . Smoking status: Current Some Day Smoker    Last attempt to quit: 09/03/2006    Years since quitting: 12.9  . Smokeless tobacco: Never Used  Vaping Use  . Vaping Use: Never used  Substance Use Topics  . Alcohol use: No  . Drug use: No    Home Medications Prior to Admission medications   Medication Sig Start Date End Date Taking?  Authorizing Provider  amiodarone (PACERONE) 200 MG tablet Take 1 tablet (200 mg total) by mouth daily. 08/03/19  Yes Barton Dubois, MD  apixaban (ELIQUIS) 5 MG TABS tablet Take 1 tablet (5 mg total) by mouth 2 (two) times daily. Patient taking differently: Take 5 mg by mouth every morning.  02/20/19  Yes Donnamae Jude, MD  aspirin 81 MG chewable tablet Chew 81 mg by mouth daily.   Yes [provider]  atorvastatin (LIPITOR) 10 MG tablet Take 1 tablet (10 mg total) by mouth every evening. 08/03/19  Yes Barton Dubois, MD  calcium acetate (PHOSLO) 667 MG capsule Take 1 capsule (667 mg total) by mouth 3 (three) times daily with meals. 02/21/19  Yes Donnamae Jude,  MD  calcium carbonate (TUMS - DOSED IN MG ELEMENTAL CALCIUM) 500 MG chewable tablet Chew 4 tablets (800 mg of elemental calcium total) by mouth 2 (two) times daily. 08/03/19  Yes Barton Dubois, MD  carvedilol (COREG) 3.125 MG tablet Take 1 tablet (3.125 mg total) by mouth 2 (two) times daily with a meal. 08/03/19  Yes Barton Dubois, MD  LANTUS SOLOSTAR 100 UNIT/ML Solostar Pen Inject 10 Units into the skin at bedtime.  01/18/19  Yes [provider]  lidocaine-prilocaine (EMLA) cream Apply 1 application topically See admin instructions. Applied three times weekly at dialysis on MWF 05/06/19  Yes [provider]  multivitamin (RENA-VIT) TABS tablet Take 1 tablet by mouth daily. 08/04/18  Yes [provider]  patiromer (VELTASSA) 8.4 g packet Take 1 packet (8.4 g total) by mouth 3 (three) times a week. On Tuesday, Thursday and Saturday 06/10/19  Yes Kathie Dike, MD    Allergies    Tape  Review of Systems   Review of Systems  Constitutional: Negative for chills and fever.  HENT: Negative for congestion and sore throat.   Eyes: Negative.   Respiratory: Positive for shortness of breath. Negative for cough and chest tightness.   Cardiovascular: Positive for chest pain.  Gastrointestinal: Positive for nausea  and vomiting. Negative for abdominal pain.  Genitourinary: Negative.   Musculoskeletal: Negative for arthralgias, joint swelling and neck pain.  Skin: Negative.  Negative for rash and wound.  Neurological: Negative for dizziness, weakness, light-headedness, numbness and headaches.  Psychiatric/Behavioral: Negative.     Physical Exam Updated Vital Signs BP 134/76 (BP Location: Right Wrist)   Pulse 90   Temp 98.9 F (37.2 C) (Oral)   Resp 14   Ht 6' (1.829 m)   Wt 86.2 kg   SpO2 95%   BMI 25.77 kg/m   Physical Exam Vitals and nursing note reviewed.  Constitutional:      Appearance: He is well-developed.  HENT:     Head: Normocephalic and atraumatic.  Eyes:     Conjunctiva/sclera: Conjunctivae normal.  Cardiovascular:     Rate and Rhythm: Normal rate and regular rhythm.     Heart sounds: Normal heart sounds.  Pulmonary:     [place]: Pulmonary effort is normal.     Breath sounds: Normal breath sounds. No wheezing, rhonchi or rales.  Abdominal:     General: Bowel sounds are normal.     Palpations: Abdomen is soft.     Tenderness: There is no abdominal tenderness.  Musculoskeletal:        General: Normal range of motion.     Cervical back: Normal range of motion.  Skin:    General: Skin is warm and dry.  Neurological:     Mental Status: He is alert.     ED Results / Procedures / Treatments   Labs (all labs ordered are listed, but only abnormal results are displayed) Labs Reviewed  CBC - Abnormal; Notable for the following components:      Result Value   WBC 11.4 (*)    RBC 3.57 (*)    Hemoglobin 12.0 (*)    HCT 38.2 (*)    MCV 107.0 (*)    All other components within normal limits  COMPREHENSIVE METABOLIC PANEL - Abnormal; Notable for the following components:   BUN 59 (*)    Creatinine, Ser 8.96 (*)    Calcium 7.1 (*)    GFR calc non Af Amer 6 (*)    GFR calc Af  Amer 7 (*)    All other components within normal limits  TROPONIN I (HIGH SENSITIVITY) -  Abnormal; Notable for the following components:   Troponin I (High Sensitivity) 25 (*)    All other components within normal limits  TROPONIN I (HIGH SENSITIVITY) - Abnormal; Notable for the following components:   Troponin I (High Sensitivity) 25 (*)    All other components within normal limits    EKG EKG Interpretation  Date/Time:  Wednesday August 07 2019 09:16:31 EDT Ventricular Rate:  103 PR Interval:    QRS Duration: 121 QT Interval:  386 QTC Calculation: 506 R Axis:   -67 Text Interpretation: Sinus tachycardia Left bundle branch block Confirmed by Elnora Morrison [phone]) on 08/07/2019 9:27:27 AM   Radiology DG Chest Port 1 View  Result Date: 08/07/2019 CLINICAL DATA:  Chest pain.  Left arm pain and numbness. EXAM: PORTABLE CHEST 1 VIEW COMPARISON:  08/02/2019 FINDINGS: Heart size is normal. No pleural effusion or edema. No airspace consolidation identified. The visualized osseous structures are unremarkable. IMPRESSION: No active disease. Electronically Signed   By: Kerby Moors M.D.   On: 08/07/2019 09:43    Procedures Procedures (including critical care time)  Medications Ordered in ED Medications  nitroGLYCERIN (NITROSTAT) SL tablet 0.4 mg (0.4 mg Sublingual Given 08/07/19 1029)  aspirin chewable tablet 162 mg (162 mg Oral Given 08/07/19 0956)  calcium gluconate 1 g/ 50 mL sodium chloride IVPB (0 g Intravenous Stopped 08/07/19 1207)    ED Course  I have reviewed the triage vital signs and the nursing notes.  Pertinent labs & imaging results that were available during my care of the patient were reviewed by me and considered in my medical decision making (see chart for details).    MDM Rules/Calculators/A&P HEAR Score: 5                        Labs reviewed.  Calcium low at 7.1.  Given IV calcium gluconate 1 gram.   Pt with a heart score of 5,  Delta troponin stable at 25 which historically is around his baseline.  He was given ntg 0.4 SL x 2 (received 1 prior to  arrival), now with full resolution of chest pain.  Discussed with Dr. Harl Bowie including past history, current presentation and labs today.  Felt safe for discharge home with close office followup - cards will contact pt with appt time and pt was made aware.  He can proceed to dialysis at this time.  Advised to continue taking his phoslo and his daily Tums to continue supplementing his calcium.    Final Clinical Impression(s) / ED Diagnoses Final diagnoses:  Chest pain, unspecified type  Hypocalcemia    Rx / DC Orders ED Discharge Orders    None       Landis Martins 08/07/19 1400    Elnora Morrison, MD 08/07/19 1624

## 2019-08-09 ENCOUNTER — Emergency Department (HOSPITAL_COMMUNITY): Payer: Medicaid Other

## 2019-08-09 ENCOUNTER — Emergency Department (HOSPITAL_COMMUNITY)
Admission: EM | Admit: 2019-08-09 | Discharge: 2019-08-09 | Disposition: A | Discharge status: Home / Self Care | Payer: Medicaid Other | Attending: Emergency Medicine | Admitting: Emergency Medicine

## 2019-08-09 ENCOUNTER — Encounter (HOSPITAL_COMMUNITY): Payer: Self-pay | Admitting: Emergency Medicine

## 2019-08-09 ENCOUNTER — Other Ambulatory Visit: Payer: Self-pay

## 2019-08-09 DIAGNOSIS — R079 Chest pain, unspecified: Secondary | ICD-10-CM | POA: Diagnosis present

## 2019-08-09 DIAGNOSIS — I2510 Atherosclerotic heart disease of native coronary artery without angina pectoris: Secondary | ICD-10-CM | POA: Insufficient documentation

## 2019-08-09 DIAGNOSIS — Z7901 Long term (current) use of anticoagulants: Secondary | ICD-10-CM | POA: Diagnosis not present

## 2019-08-09 DIAGNOSIS — Z87891 Personal history of nicotine dependence: Secondary | ICD-10-CM | POA: Insufficient documentation

## 2019-08-09 DIAGNOSIS — E1122 Type 2 diabetes mellitus with diabetic chronic kidney disease: Secondary | ICD-10-CM | POA: Insufficient documentation

## 2019-08-09 DIAGNOSIS — Z992 Dependence on renal dialysis: Secondary | ICD-10-CM | POA: Diagnosis not present

## 2019-08-09 DIAGNOSIS — N186 End stage renal disease: Secondary | ICD-10-CM | POA: Diagnosis not present

## 2019-08-09 DIAGNOSIS — I509 Heart failure, unspecified: Secondary | ICD-10-CM | POA: Diagnosis not present

## 2019-08-09 DIAGNOSIS — Z7982 Long term (current) use of aspirin: Secondary | ICD-10-CM | POA: Insufficient documentation

## 2019-08-09 LAB — CBC WITH DIFFERENTIAL/PLATELET
Abs Immature Granulocytes: 0.01 10*3/uL (ref 0.00–0.07)
Basophils Absolute: 0 10*3/uL (ref 0.0–0.1)
Basophils Relative: 1 %
Eosinophils Absolute: 0.3 10*3/uL (ref 0.0–0.5)
Eosinophils Relative: 4 %
HCT: 32.4 % — ABNORMAL LOW (ref 39.0–52.0)
Hemoglobin: 10.3 g/dL — ABNORMAL LOW (ref 13.0–17.0)
Immature Granulocytes: 0 %
Lymphocytes Relative: 24 %
Lymphs Abs: 1.9 10*3/uL (ref 0.7–4.0)
MCH: 33.7 pg (ref 26.0–34.0)
MCHC: 31.8 g/dL (ref 30.0–36.0)
MCV: 105.9 fL — ABNORMAL HIGH (ref 80.0–100.0)
Monocytes Absolute: 0.6 10*3/uL (ref 0.1–1.0)
Monocytes Relative: 7 %
Neutro Abs: 5.1 10*3/uL (ref 1.7–7.7)
Neutrophils Relative %: 64 %
Platelets: 201 10*3/uL (ref 150–400)
RBC: 3.06 MIL/uL — ABNORMAL LOW (ref 4.22–5.81)
RDW: 12.9 % (ref 11.5–15.5)
WBC: 7.9 10*3/uL (ref 4.0–10.5)
nRBC: 0 % (ref 0.0–0.2)

## 2019-08-09 LAB — BASIC METABOLIC PANEL
Anion gap: 13 (ref 5–15)
BUN: 41 mg/dL — ABNORMAL HIGH (ref 6–20)
CO2: 28 mmol/L (ref 22–32)
Calcium: 7.5 mg/dL — ABNORMAL LOW (ref 8.9–10.3)
Chloride: 94 mmol/L — ABNORMAL LOW (ref 98–111)
Creatinine, Ser: 7.09 mg/dL — ABNORMAL HIGH (ref 0.61–1.24)
GFR calc Af Amer: 10 mL/min — ABNORMAL LOW (ref >60)
GFR calc non Af Amer: 8 mL/min — ABNORMAL LOW (ref >60)
Glucose, Bld: 108 mg/dL — ABNORMAL HIGH (ref 70–99)
Potassium: 4.1 mmol/L (ref 3.5–5.1)
Sodium: 135 mmol/L (ref 135–145)

## 2019-08-09 LAB — TROPONIN I (HIGH SENSITIVITY)
Troponin I (High Sensitivity): 15 ng/L (ref <18)
Troponin I (High Sensitivity): 15 ng/L (ref <18)

## 2019-08-09 IMAGING — DX DG CHEST 1V PORT
1 series · 1 of 1 positions shown · non-contrast
Comparison: [DATE].  [DATE].  [DATE].

CLINICAL DATA: Chest pain.

EXAM:
PORTABLE CHEST 1 VIEW

[chest ap]
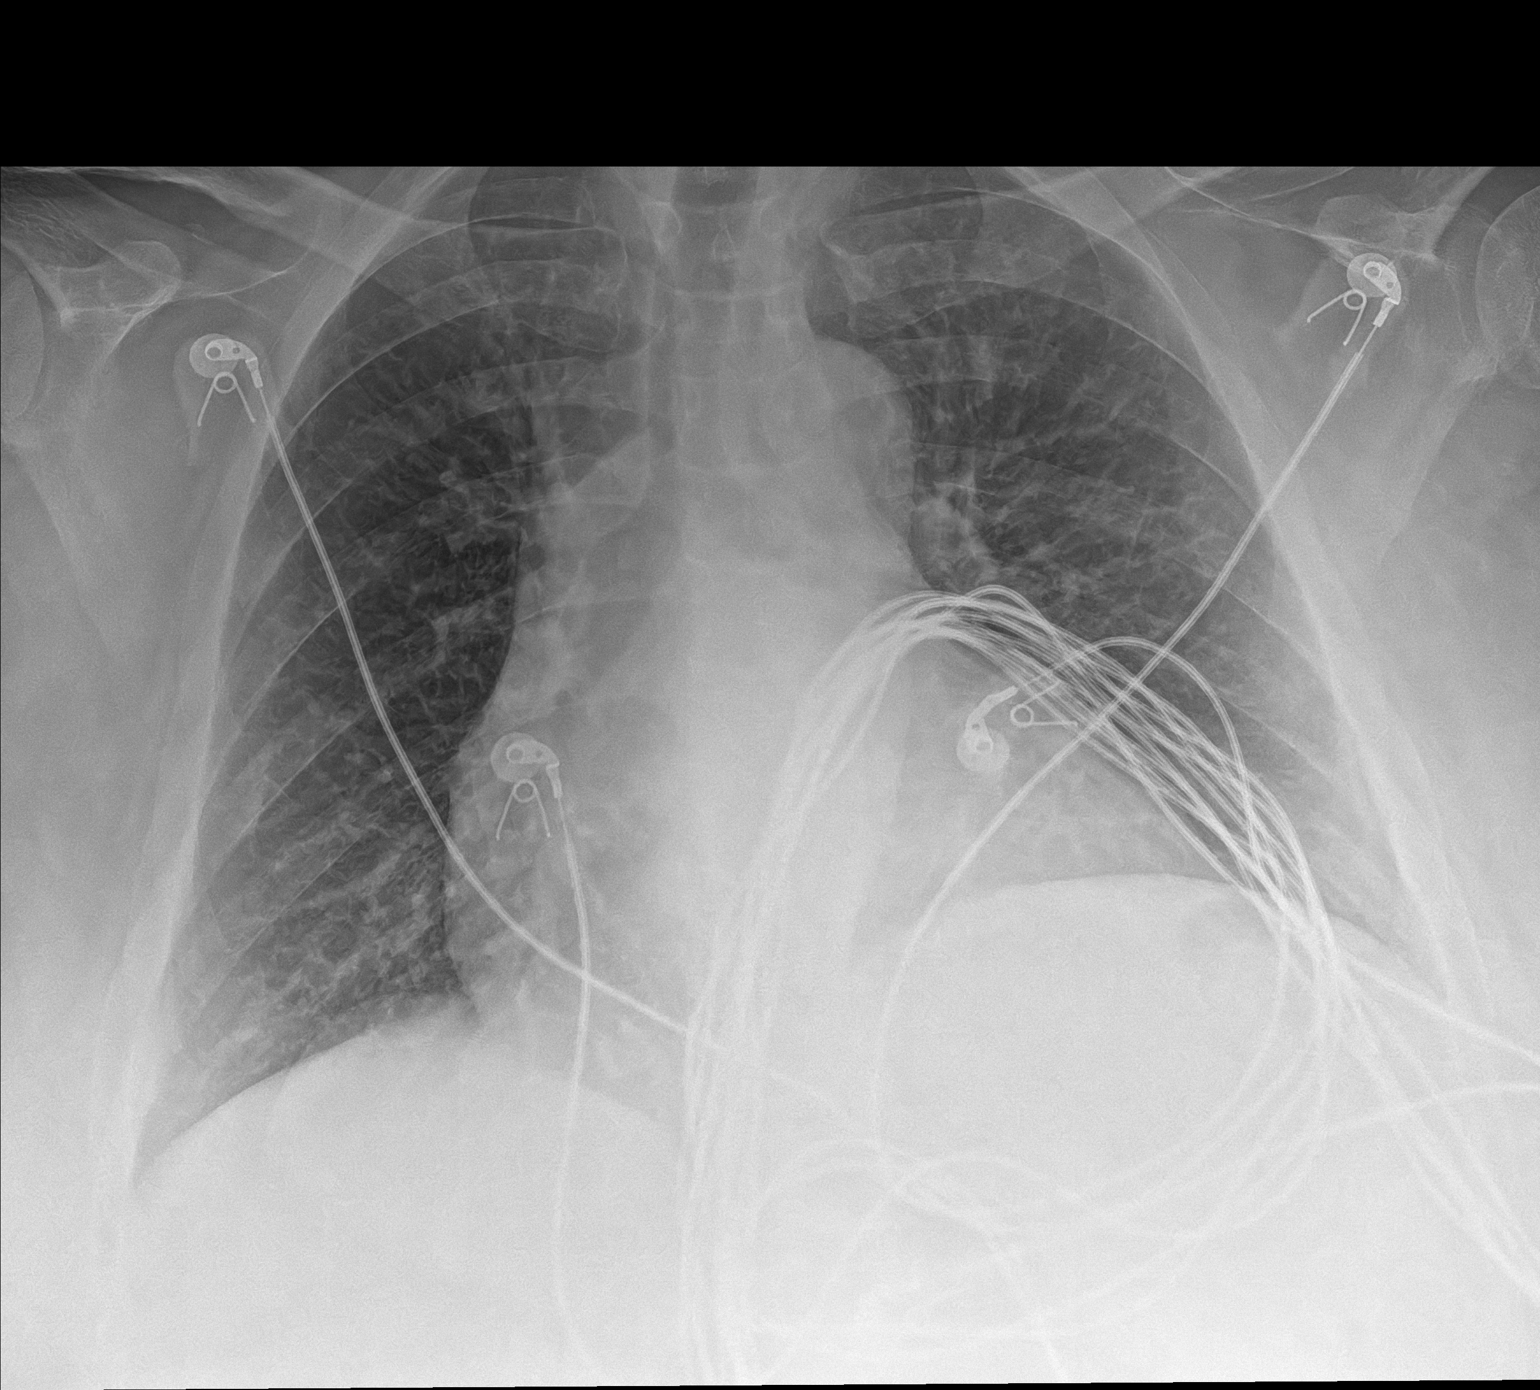

[1 of 1 positions shown; findings below may reference images not displayed]

FINDINGS: Stable severe cardiomegaly. No pulmonary venous congestion. Stable
chronic interstitial changes. No focal alveolar infiltrate or edema.
No pleural effusion or pneumothorax.
IMPRESSION: 1.  Stable severe cardiomegaly.  No pulmonary venous congestion.

2. Stable chronic interstitial changes. No focal infiltrate or
edema.

## 2019-08-09 MED ORDER — NITROGLYCERIN 0.4 MG SL SUBL: 0.4000 mg | SUBLINGUAL_TABLET | SUBLINGUAL | Status: DC | PRN

## 2019-08-09 NOTE — Discharge Instructions (Signed)
You were seen in the emergency room today with chest discomfort.  Your emergency department work-up did not show evidence of heart attack.  I would like you to attend your dialysis session later this morning.  Please follow closely with your primary care doctor and schedule a follow-up appointment in the next 7 days.  Please keep your appointment with the cardiology team as scheduled later this month.  If your pain symptoms suddenly worsen or you develop other severe symptoms please return to the emergency department for reevaluation and/or call  [phone].

## 2019-08-09 NOTE — ED Provider Notes (Signed)
[facility] Provider Note   CSN: [id number] Arrival date & time: 08/09/19  0358   History Chief Complaint  Patient presents with  . Chest Pain    Brendan Campbell is a 49 y.o. male.  The history is provided by the patient.  Chest Pain He has history of diabetes, hyperlipidemia, end-stage renal disease on hemodialysis, atrial fibrillation anticoagulated on apixaban and comes in because of chest pain which started last night after taking his dose of apixaban.  He describes a sharp pain in the chest with some radiation to the head with some related vertigo.  There is mild dyspnea and nausea but no vomiting.  There is slight diaphoresis.  He took aspirin at home and EMS gave him nitroglycerin.  Nitroglycerin reduced the pain from his 9/10 down to 5/10.  He is a former smoker and denies history of hypertension.  There is a family history of premature coronary atherosclerosis.  Of note, he missed his dialysis scheduled for 2 days ago, but was dialyzed yesterday and is scheduled for dialysis today.  Past Medical History:  Diagnosis Date  . Anemia   . Anxiety   . Blood transfusion without reported diagnosis   . CHF (congestive heart failure) ([place])   . Chronic kidney disease   . Depression   . Diabetes mellitus without complication ([place])   . End stage renal disease ([place])    M/W/F [facility]  . Hyperlipidemia   . Neuropathy   . Peripheral vascular disease ([place])   . PTSD (post-traumatic stress disorder)     Patient Active Problem List   Diagnosis Date Noted  . Hypocalcemia   . Atrial fibrillation, chronic ([place])   . Hyperparathyroidism ([place])   . Left sided numbness 08/02/2019  . Cardiac arrest ([place]) 06/07/2019  . Atrial flutter ([place])   . CHF (congestive heart failure) ([place])   . Neuropathy   . Peripheral vascular disease ([place])   . Abnormal EKG   . Leukocytosis   . Atypical pneumonia   . ESRD needing dialysis ([place])   . Acute pulmonary edema (HCC)   . ESRD on  hemodialysis ([place])   . Hyperkalemia 10/05/2018  . Weakness 10/05/2018  . Chest pain 05/10/2016  . Anemia due to end stage renal disease ([place]) 05/10/2016  . 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 Lumbar radiculopathy 03/08/2016  . S/P below knee amputation, right ([place]) 03/05/2015  . Type 2 diabetes with nephropathy ([place]) 03/03/2015  . HLD (hyperlipidemia) 03/03/2015    Past Surgical History:  Procedure Laterality Date  . A/V FISTULAGRAM N/A 10/09/2018   Procedure: A/V FISTULAGRAM;  Surgeon: Serafina Mitchell, MD;  Location: [place] [facility];  Service: Cardiovascular;  Laterality: N/A;  . AV FISTULA PLACEMENT Left 09/22/2016   Procedure: CREATION OF LEFT ARM ARTERIOVENOUS (AV) FISTULA;  Surgeon: Waynetta Sandy, MD;  Location: [place];  Service: Vascular;  Laterality: Left;  . AV FISTULA PLACEMENT Left 10/31/2017   Procedure: INSERTION OF ARTERIOVENOUS (AV) GORE-TEX 4-70mm STETCH GRAFT LEFT ARM;  Surgeon: Waynetta Sandy, MD;  Location: [place];  Service: Vascular;  Laterality: Left;  . BASCILIC VEIN TRANSPOSITION Left 08/17/2017   Procedure: SECOND STAGE BASILIC VEIN TRANSPOSITION LEFT ARM;  Surgeon: Waynetta Sandy, MD;  Location: [place];  Service: Vascular;  Laterality: Left;  . BELOW KNEE LEG AMPUTATION Right   . CARDIOVERSION N/A 02/19/2019   Procedure: CARDIOVERSION;  Surgeon: [name], MD;  Location: [place];  Service: Cardiovascular;  Laterality: N/A;  . CHOLECYSTECTOMY    . FOOT SURGERY    . IR FLUORO GUIDE CV LINE RIGHT  05/16/2016  . IR FLUORO GUIDE CV LINE RIGHT  02/16/2019  . IR REMOVAL TUN CV CATH W/O FL  05/16/2016  . IR REMOVAL TUN CV CATH W/O FL  02/19/2019  . IR THROMBECTOMY AV FISTULA W/THROMBOLYSIS/PTA INC/SHUNT/IMG LEFT Left 11/09/2018  . IR THROMBECTOMY AV FISTULA W/THROMBOLYSIS/PTA INC/SHUNT/IMG LEFT Left 06/22/2019  . IR US GUIDE VASC ACCESS LEFT  11/09/2018  . IR US GUIDE VASC ACCESS LEFT  06/22/2019  . IR US GUIDE VASC ACCESS RIGHT   05/16/2016  . IR US GUIDE VASC ACCESS RIGHT  02/16/2019  . PERIPHERAL VASCULAR BALLOON ANGIOPLASTY  10/09/2018   Procedure: PERIPHERAL VASCULAR BALLOON ANGIOPLASTY;  Surgeon: Serafina Mitchell, MD;  Location: [place] [facility];  Service: Cardiovascular;;  . TEE WITHOUT CARDIOVERSION N/A 02/19/2019   Procedure: TRANSESOPHAGEAL ECHOCARDIOGRAM (TEE);  Surgeon: [name], MD;  Location: [facility] ENDOSCOPY;  Service: Cardiovascular;  Laterality: N/A;       Family History  Problem Relation Age of Onset  . Cancer Mother        lung  . Diabetes Mother   . Heart attack Father   . Diabetes Father   . Diabetes Sister     Social History   Tobacco Use  . Smoking status: Current Some Day Smoker    Last attempt to quit: 09/03/2006    Years since quitting: 12.9  . Smokeless tobacco: Never Used  Vaping Use  . Vaping Use: Never used  Substance Use Topics  . Alcohol use: No  . Drug use: No    Home Medications Prior to Admission medications   Medication Sig Start Date End Date Taking? Authorizing Provider  amiodarone (PACERONE) 200 MG tablet Take 1 tablet (200 mg total) by mouth daily. 08/03/19   Barton Dubois, MD  apixaban (ELIQUIS) 5 MG TABS tablet Take 1 tablet (5 mg total) by mouth 2 (two) times daily. Patient taking differently: Take 5 mg by mouth every morning.  02/20/19   Donnamae Jude, MD  aspirin 81 MG chewable tablet Chew 81 mg by mouth daily.    [provider]  atorvastatin (LIPITOR) 10 MG tablet Take 1 tablet (10 mg total) by mouth every evening. 08/03/19   Barton Dubois, MD  calcium acetate (PHOSLO) 667 MG capsule Take 1 capsule (667 mg total) by mouth 3 (three) times daily with meals. 02/21/19   Donnamae Jude, MD  calcium carbonate (TUMS - DOSED IN MG ELEMENTAL CALCIUM) 500 MG chewable tablet Chew 4 tablets (800 mg of elemental calcium total) by mouth 2 (two) times daily. 08/03/19   Barton Dubois, MD  carvedilol (COREG) 3.125 MG tablet Take 1 tablet (3.125 mg total) by mouth  2 (two) times daily with a meal. 08/03/19   Barton Dubois, MD  LANTUS SOLOSTAR 100 UNIT/ML Solostar Pen Inject 10 Units into the skin at bedtime.  01/18/19   [provider]  lidocaine-prilocaine (EMLA) cream Apply 1 application topically See admin instructions. Applied three times weekly at dialysis on MWF 05/06/19   [provider]  multivitamin (RENA-VIT) TABS tablet Take 1 tablet by mouth daily. 08/04/18   [provider]  patiromer (VELTASSA) 8.4 g packet Take 1 packet (8.4 g total) by mouth 3 (three) times a week. On Tuesday, Thursday and Saturday 06/10/19   Kathie Dike, MD    Allergies    Tape  Review of Systems  Review of Systems  Cardiovascular: Positive for chest pain.  All other systems reviewed and are negative.   Physical Exam Updated Vital Signs BP (!) 106/32   Pulse 73   Resp 15   Ht 6' (1.829 m)   Wt 88.9 kg   SpO2 95%   BMI 26.58 kg/m   Physical Exam Vitals and nursing note reviewed.   49 year old male, resting comfortably and in no acute distress. Vital signs are normal. Oxygen saturation is 95%, which is normal. Head is normocephalic and atraumatic. PERRLA, EOMI. Oropharynx is clear. Neck is nontender and supple without adenopathy or JVD. Back is nontender and there is no CVA tenderness. Lungs are clear without rales, wheezes, or rhonchi. Chest is nontender. Heart has regular rate and rhythm without murmur. Abdomen is soft, flat, nontender without masses or hepatosplenomegaly and peristalsis is normoactive. Extremities: AV fistula present in the left upper arm with thrill present.  Right below the knee amputation. Skin is warm and dry without rash. Neurologic: Mental status is normal, cranial nerves are intact, there are no motor or sensory deficits.  ED Results / Procedures / Treatments   Labs (all labs ordered are listed, but only abnormal results are displayed) Labs Reviewed - No data to display  EKG EKG  Interpretation  Date/Time:  Friday August 09 2019 04:08:46 EDT Ventricular Rate:  79 PR Interval:    QRS Duration: 125 QT Interval:  449 QTC Calculation: 515 R Axis:   -53 Text Interpretation: Sinus rhythm.  Borderline prolonged PR interval.  Left bundle branch block.  When compared with ECG of 08/07/2019, no significant change was found.  Confirmed by Delora Fuel ([id number]) on 08/09/2019 4:22:29 AM   Radiology DG Chest Port 1 View  Result Date: 08/07/2019 CLINICAL DATA:  Chest pain.  Left arm pain and numbness. EXAM: PORTABLE CHEST 1 VIEW COMPARISON:  08/02/2019 FINDINGS: Heart size is normal. No pleural effusion or edema. No airspace consolidation identified. The visualized osseous structures are unremarkable. IMPRESSION: No active disease. Electronically Signed   By: Kerby Moors M.D.   On: 08/07/2019 09:43    Procedures Procedures  Medications Ordered in ED Medications  nitroGLYCERIN (NITROSTAT) SL tablet 0.4 mg (has no administration in time range)    ED Course  I have reviewed the triage vital signs and the nursing notes.  Pertinent labs & imaging results that were available during my care of the patient were reviewed by me and considered in my medical decision making (see chart for details).  MDM Rules/Calculators/A&P Chest pain worrisome for coronary disease.  He has significant risk factors for coronary artery disease.  ECG has left bundle branch block and is unchanged from prior.  Screening labs are obtained and he will be given additional nitroglycerin.  Old records are reviewed, and it is noted that he had a hospital admission for cardiac arrest secondary to hyperkalemia, ED visit for chest pain 2 days ago.  Heart score is 4 which puts him at moderate risk for major adverse cardiac events in the next 6 weeks.  Chest x-ray shows cardiomegaly, no acute process.  Labs are pending.  Case is signed out to Dr. Laverta Baltimore.  Final Clinical Impression(s) / ED Diagnoses Final diagnoses:   Nonspecific chest pain  End-stage renal disease on hemodialysis [facility])    Rx / DC Orders ED Discharge Orders    None       Delora Fuel, MD 49/70/26 [phone]

## 2019-08-09 NOTE — ED Provider Notes (Signed)
Blood pressure 92/76, pulse 74, resp. rate 13, height 6' (1.829 m), weight 88.9 kg, SpO2 96 %.  Assuming care from Dr. Roxanne Mins.  In short, Brendan Campbell is a 49 y.o. male with a chief complaint of Chest Pain .  Refer to the original H&P for additional details.  The current plan of care is to f/u on pain symptoms and delta troponin.   EKG Interpretation  Date/Time:  Friday August 09 2019 04:08:46 EDT Ventricular Rate:  79 PR Interval:    QRS Duration: 125 QT Interval:  449 QTC Calculation: 515 R Axis:   -53 Text Interpretation: duplicate, discard Confirmed by Delora Fuel ([id number]) on 08/09/2019 4:22:29 AM       09:23 AM  Patient's delta troponins are unchanged at 15.  Potassium of 4.1.  EKG reviewed with no acute ischemic change or arrhythmia.  Patient's pain symptoms have resolved.  He is scheduled to see cardiology later this month.  He has dialysis scheduled for 11 AM today.  Plan for him to attend this extra session and follow with cardiology as an outpatient.  Discussed strict ED return precautions with worsening chest pain or other sudden, severe symptoms.  Patient verbalizes understanding and is in agreement with plan at discharge.     [name], MD 08/09/19 [phone]

## 2019-08-09 NOTE — ED Triage Notes (Signed)
Pt arrives via [facility] with C/O CP. Pt has received 324 Asprin at home and  1 nitro en route with EMS. Pt reporting improvement of CP after the nitro.

## 2019-08-23 ENCOUNTER — Encounter: Payer: Self-pay | Admitting: Student

## 2019-08-23 ENCOUNTER — Ambulatory Visit (INDEPENDENT_AMBULATORY_CARE_PROVIDER_SITE_OTHER): Payer: Medicaid Other | Admitting: Student

## 2019-08-23 ENCOUNTER — Other Ambulatory Visit: Payer: Self-pay

## 2019-08-23 VITALS — BP 146/84 | HR 82 | Ht 72.0 in | Wt 212.0 lb

## 2019-08-23 DIAGNOSIS — I4892 Unspecified atrial flutter: Secondary | ICD-10-CM

## 2019-08-23 DIAGNOSIS — R079 Chest pain, unspecified: Secondary | ICD-10-CM | POA: Diagnosis not present

## 2019-08-23 DIAGNOSIS — I10 Essential (primary) hypertension: Secondary | ICD-10-CM | POA: Diagnosis not present

## 2019-08-23 DIAGNOSIS — N186 End stage renal disease: Secondary | ICD-10-CM

## 2019-08-23 DIAGNOSIS — I5022 Chronic systolic (congestive) heart failure: Secondary | ICD-10-CM

## 2019-08-23 MED ORDER — CARVEDILOL 3.125 MG PO TABS: 3.1250 mg | ORAL_TABLET | Freq: Two times a day (BID) | ORAL | 3 refills | Status: DC

## 2019-08-23 MED ORDER — APIXABAN 5 MG PO TABS: 5.0000 mg | ORAL_TABLET | Freq: Two times a day (BID) | ORAL | 3 refills | Status: AC

## 2019-08-23 NOTE — Patient Instructions (Signed)
Medication Instructions:   START Coreg 3.125mg  Twice Daily  Labwork:  None  Testing/Procedures:  Brendan Campbell for Chest Pain  Follow-Up:  With [facility] or Dr. Harl Bowie in 2 months.   Any Other Special Instructions Will Be Listed Below (If Applicable).     If you need a refill on your cardiac medications before your next appointment, please call your pharmacy.

## 2019-08-23 NOTE — Progress Notes (Signed)
Cardiology Office Note    Date:  08/24/2019   ID:  Brendan Campbell, DOB 1970-03-06, MRN [id number]  PCP:  Monico Blitz, MD  Cardiologist: Carlyle Dolly, MD    Chief Complaint  Patient presents with  . Follow-up    recent Emergency Dept visit    History of Present Illness:    Brendan Campbell is a 49 y.o. male with past medical history of chronic systolic CHF (EF 15% by echo in 07/2019), persistent atrial flutter (s/p DCCV in 02/2019), HTN, Type 2 DM, ESRD, PVD (s/p R BKA) and medication noncompliance who presents to the office today for Emergency Department Follow-up.   He was last examined by Dr. Harl Bowie in 05/2019 during an admission for bradycardia/asystole arrest (required CPR, chest compressions and Epinephrine) in the setting of being severely hyperkalemic after missing HD. He remained bradycardiac at the time of admission and his conduction disease improved as his K+ normalized. Was discharged on Amiodarone 200mg  daily, Eliquis 5mg  BID, ASA 81mg  daily, and Atorvastatin 10mg  daily   He was again admitted from 6/25- 08/03/2019 for evaluation of left arm and facial numbness. CT Head and Brain MRI showed no acute intracranial abnormalities. His presentation was thought to be most consistent with a TIA. Cardiology was not consulted during admission but he was started on Coreg 3.125mg  BID for his cardiomyopathy by the Hospitalist.    Was again evaluated in the ED on 08/07/2019 for sharp chest pain. HS Troponin values were flat at 25 and 26 and his EKG showed no acute changes. Was discharged and informed to follow-up with Cardiology as an outpatient. Presented back to the ED on 08/09/2019 for recurrent chest pain. HS Troponin values were negative at 15 and his EKG showed NSR, HR 80 with known LBBB. Was again discharged and informed to follow-up with Cardiology as an outpatient.   In talking with the patient today, he reports having episodes of chest discomfort which can occur sporadically, at  rest or with activity. Reports this started after he required CPR in 05/2019. He has baseline dyspnea on exertion but denies any specific orthopnea, PND or lower extremity edema.  No recent palpitations, dizziness or presyncope.  Past Medical History:  Diagnosis Date  . Anemia   . Anxiety   . Blood transfusion without reported diagnosis   . CHF (congestive heart failure) (HCC)    a. EF 25% by echo in 07/2019  . Chronic kidney disease   . Depression   . Diabetes mellitus without complication ([place])   . End stage renal disease ([place])    M/W/F [facility]  . Hyperlipidemia   . Neuropathy   . Peripheral vascular disease ([place])   . PTSD (post-traumatic stress disorder)     Past Surgical History:  Procedure Laterality Date  . A/V FISTULAGRAM N/A 10/09/2018   Procedure: A/V FISTULAGRAM;  Surgeon: Serafina Mitchell, MD;  Location: [facility];  Service: Cardiovascular;  Laterality: N/A;  . AV FISTULA PLACEMENT Left 09/22/2016   Procedure: CREATION OF LEFT ARM ARTERIOVENOUS (AV) FISTULA;  Surgeon: Waynetta Sandy, MD;  Location: [facility];  Service: Vascular;  Laterality: Left;  . AV FISTULA PLACEMENT Left 10/31/2017   Procedure: INSERTION OF ARTERIOVENOUS (AV) GORE-TEX 4-35mm STETCH GRAFT LEFT ARM;  Surgeon: Waynetta Sandy, MD;  Location: [facility];  Service: Vascular;  Laterality: Left;  . BASCILIC VEIN TRANSPOSITION Left 08/17/2017   Procedure: SECOND STAGE BASILIC VEIN TRANSPOSITION LEFT ARM;  Surgeon: Waynetta Sandy, MD;  Location: [facility];  Service: Vascular;  Laterality: Left;  . BELOW KNEE LEG AMPUTATION Right   . CARDIOVERSION N/A 02/19/2019   Procedure: CARDIOVERSION;  Surgeon: [name], MD;  Location: [facility];  Service: Cardiovascular;  Laterality: N/A;  . CHOLECYSTECTOMY    . FOOT SURGERY    . IR FLUORO GUIDE CV LINE RIGHT  05/16/2016  . IR FLUORO GUIDE CV LINE RIGHT  02/16/2019  . IR REMOVAL TUN CV CATH W/O FL  05/16/2016  . IR REMOVAL TUN CV CATH  W/O FL  02/19/2019  . IR THROMBECTOMY AV FISTULA W/THROMBOLYSIS/PTA INC/SHUNT/IMG LEFT Left 11/09/2018  . IR THROMBECTOMY AV FISTULA W/THROMBOLYSIS/PTA INC/SHUNT/IMG LEFT Left 06/22/2019  . IR US GUIDE VASC ACCESS LEFT  11/09/2018  . IR US GUIDE VASC ACCESS LEFT  06/22/2019  . IR US GUIDE VASC ACCESS RIGHT  05/16/2016  . IR US GUIDE VASC ACCESS RIGHT  02/16/2019  . PERIPHERAL VASCULAR BALLOON ANGIOPLASTY  10/09/2018   Procedure: PERIPHERAL VASCULAR BALLOON ANGIOPLASTY;  Surgeon: Serafina Mitchell, MD;  Location: [facility];  Service: Cardiovascular;;  . TEE WITHOUT CARDIOVERSION N/A 02/19/2019   Procedure: TRANSESOPHAGEAL ECHOCARDIOGRAM (TEE);  Surgeon: [name], MD;  Location[facility];  Service: Cardiovascular;  Laterality: N/A;    Current Medications: Outpatient Medications Prior to Visit  Medication Sig Dispense Refill  . aspirin 81 MG chewable tablet Chew 81 mg by mouth daily.    . calcium acetate (PHOSLO) 667 MG capsule Take 1 capsule (667 mg total) by mouth 3 (three) times daily with meals. 90 capsule 2  . calcium carbonate (TUMS - DOSED IN MG ELEMENTAL CALCIUM) 500 MG chewable tablet Chew 4 tablets (800 mg of elemental calcium total) by mouth 2 (two) times daily. 60 tablet 2  . LANTUS SOLOSTAR 100 UNIT/ML Solostar Pen Inject 10 Units into the skin at bedtime.     . lidocaine-prilocaine (EMLA) cream Apply 1 application topically See admin instructions. Applied three times weekly at dialysis on MWF    . multivitamin (RENA-VIT) TABS tablet Take 1 tablet by mouth daily.    . patiromer (VELTASSA) 8.4 g packet Take 1 packet (8.4 g total) by mouth 3 (three) times a week. On Tuesday, Thursday and Saturday 30 each 0  . apixaban (ELIQUIS) 5 MG TABS tablet Take 1 tablet (5 mg total) by mouth 2 (two) times daily. (Patient taking differently: Take 5 mg by mouth every morning. ) 180 tablet 3  . carvedilol (COREG) 3.125 MG tablet Take 1 tablet (3.125 mg total) by mouth 2 (two) times daily  with a meal. 60 tablet 1  . atorvastatin (LIPITOR) 10 MG tablet Take 1 tablet (10 mg total) by mouth every evening. (Patient not taking: Reported on 08/23/2019) 90 tablet 2  . amiodarone (PACERONE) 200 MG tablet Take 1 tablet (200 mg total) by mouth daily. (Patient not taking: Reported on 08/23/2019) 90 tablet 2   No facility-administered medications prior to visit.     Allergies:   Tape   Social History   Socioeconomic History  . Marital status: Single    Spouse name: Not on file  . Number of children: Not on file  . Years of education: Not on file  . Highest education level: Not on file  Occupational History  . Not on file  Tobacco Use  . Smoking status: Current Some Day Smoker    Last attempt to quit: 09/03/2006    Years since quitting: 12.9  . Smokeless tobacco: Never Used  Vaping Use  . Vaping Use: Never used  Substance and Sexual Activity  . Alcohol use: No  . Drug use: No  . Sexual activity: Not Currently  Other Topics Concern  . Not on file  Social History Narrative   Pt lives in [place] with roommate.  Not followed by an outpatient provider currently, but is trying to schedule an appointment with [facility].   Social Determinants of Health   Financial Resource Strain:   . Difficulty of Paying Living Expenses:   Food Insecurity:   . Worried About Charity fundraiser in the Last Year:   . Arboriculturist in the Last Year:   Transportation Needs:   . Film/video editor (Medical):   [name] Lack of Transportation (Non-Medical):   Physical Activity:   . Days of Exercise per Week:   . Minutes of Exercise per Session:   Stress:   . Feeling of Stress :   Social Connections:   . Frequency of Communication with Friends and Family:   . Frequency of Social Gatherings with Friends and Family:   . Attends Religious Services:   . Active Member of Clubs or Organizations:   . Attends Archivist Meetings:   [name] Marital Status:      Family History:   The patient's family history includes Cancer in his mother; Diabetes in his father, mother, and sister; Heart attack in his father.   Review of Systems:   Please see the history of present illness.     General:  No chills, fever, night sweats or weight changes.  Cardiovascular:  No dyspnea on exertion, edema, orthopnea, palpitations, paroxysmal nocturnal dyspnea. Positive for chest pain.  Dermatological: No rash, lesions/masses Respiratory: No cough, dyspnea Urologic: No hematuria, dysuria Abdominal:   No nausea, vomiting, diarrhea, bright red blood per rectum, melena, or hematemesis Neurologic:  No visual changes, wkns, changes in mental status. All other systems reviewed and are otherwise negative except as noted above.   Physical Exam:    VS:  BP (!) 146/84   Pulse 82   Ht 6' (1.829 m)   Wt 212 lb (96.2 kg)   SpO2 98%   BMI 28.75 kg/m    General: Well developed, well nourished,male appearing in no acute distress. Head: Normocephalic, atraumatic, sclera non-icteric.  Neck: No carotid bruits. JVD not elevated.  Lungs: Respirations regular and unlabored, without wheezes or rales.  Heart: Regular rate and rhythm. No S3 or S4.  No murmur, no rubs, or gallops appreciated. Abdomen: Soft, non-tender, non-distended. No obvious abdominal masses. Msk:  Strength and tone appear normal for age. No obvious joint deformities or effusions. Extremities: No clubbing or cyanosis. Trace ankle edema along LLE. BKA along RLE. Neuro: Alert and oriented X 3. Moves all extremities spontaneously. No focal deficits noted. Psych:  Responds to questions appropriately with a normal affect. Skin: No rashes or lesions noted  Wt Readings from Last 3 Encounters:  08/23/19 212 lb (96.2 kg)  08/09/19 196 lb (88.9 kg)  08/07/19 190 lb (86.2 kg)     Studies/Labs Reviewed:   EKG:  EKG is not ordered today.  Tracing from 08/09/2019 is reviewed which shows NSR, HR 80 with known LBBB.   Recent Labs: 06/07/2019:  B Natriuretic Peptide 3,441.0; Magnesium 2.4 08/02/2019: TSH 1.960 08/07/2019: ALT 13 08/09/2019: BUN 41; Creatinine, Ser 7.09; Hemoglobin 10.3; Platelets 201; Potassium 4.1; Sodium 135   Lipid Panel    Component Value Date/Time   CHOL 126  08/03/2019 0437   CHOL 238 (H) 09/15/2015 0822   TRIG 55 08/03/2019 0437   HDL 39 (L) 08/03/2019 0437   HDL 36 (L) 09/15/2015 0822   CHOLHDL 3.2 08/03/2019 0437   VLDL 11 08/03/2019 0437   LDLCALC 76 08/03/2019 0437   LDLCALC 149 (H) 09/15/2015 0822    Additional studies/ records that were reviewed today include:   Echocardiogram: 07/2019 IMPRESSIONS    1. Left ventricular ejection fraction, by estimation, is approximately  25%. The left ventricle has severely decreased function. The left  ventricle demonstrates regional wall motion abnormalities (see scoring  diagram/findings for description). There is  moderate left ventricular hypertrophy. Left ventricular diastolic  parameters are consistent with Grade I diastolic dysfunction (impaired  relaxation).  2. Right ventricular systolic function is normal. The right ventricular  size is normal. Tricuspid regurgitation signal is inadequate for assessing  PA pressure.  3. The mitral valve is grossly normal, mildly calcified annulus. Trivial  mitral valve regurgitation.  4. The aortic valve is tricuspid. Aortic valve regurgitation is not  visualized. Mild aortic valve sclerosis is present, with no evidence of  aortic valve stenosis.  5. The inferior vena cava is normal in size with greater than 50%  respiratory variability, suggesting right atrial pressure of 3 mmHg.   Assessment:    1. Chronic systolic heart failure (HCC)   2. Chest pain, unspecified type   3. Atrial flutter, unspecified type ([place])   4. Essential hypertension   5. ESRD (end stage renal disease) ([place])      Plan:   In order of problems listed above:  1. Chronic Systolic CHF - He has a known reduced EF of 25% by  most recent echocardiogram and prior ischemic evaluation has not been pursued given his noncompliance with outpatient follow-up. Reviewed his cardiomyopathy with him today and the importance of medication compliance and compliance with visits. - Volume status is managed by HD. He was previously prescribed Coreg but is not taking this. I recommended he resume Coreg 3.125mg  BID. Consider the use of Hydralazine or Nitrates at follow-up if BP allows. Not on an ACE-I/ARB/ARNI due to his ESRD.  - Given his cardiomyopathy and reports of chest pain, will obtain a Lexiscan Myoview for ischemic evaluation. Prefer this over a cath for now given compliance issues in the past and due to his recent presumed TIA.   2. Chest Pain - His episodes of chest pain have mixed features as they occur at rest or with activity and started after he required CPR in 05/2019 as he had a cardiac arrest at that time in the setting of severe hyperkalemia after missing HD.  - Will plan to obtain a Lexiscan Myoview to assess for any significant ischemia in the setting of his known cardiomyopathy and recurrent chest pain. Not a treadmill candidate given his LBBB and prosthesis.   3. Persistent Atrial Flutter - He is s/p DCCV in 02/2019 and has maintained NSR by recent EKG's and is in NSR by examination today. Says he self-discontinued Amiodarone over 4 months ago. Given he has maintained NSR and due to his young age, will not restart at this time. Will plan to restart Coreg 3.125mg  BID due to his cardiomyopathy.  - Continue Eliquis 5mg  BID for anticoagulation. He is also on ASA per Neurology given his recent TIA.   4. HTN - BP is elevated at 146/84 during today's visit. Restart Coreg 3.125mg  BID which can be further titrated as needed.   5. ESRD -  On HD - MWF schedule. He has been compliant with HD since his cardiac arrest earlier this year in the setting of severe hyperkalemia.    Medication Adjustments/Labs and Tests  Ordered: Current medicines are reviewed at length with the patient today.  Concerns regarding medicines are outlined above.  Medication changes, Labs and Tests ordered today are listed in the Patient Instructions below. Patient Instructions  Medication Instructions:   START Coreg 3.125mg  Twice Daily  Labwork:  None  Testing/Procedures:  [place] for Chest Pain  Follow-Up:  With [facility] or Dr. Harl Bowie in 2 months.   Any Other Special Instructions Will Be Listed Below (If Applicable).   If you need a refill on your cardiac medications before your next appointment, please call your pharmacy.    Signed, Erma Heritage, PA-C  08/24/2019 8:10 AM    [facility] [address] [place], [place] [address] Phone: [phone] Fax: [phone]

## 2019-08-24 ENCOUNTER — Encounter: Payer: Self-pay | Admitting: Student

## 2019-09-05 ENCOUNTER — Encounter (HOSPITAL_COMMUNITY): Payer: Medicaid Other

## 2019-09-24 ENCOUNTER — Encounter (HOSPITAL_COMMUNITY): Payer: Self-pay | Admitting: Emergency Medicine

## 2019-09-24 ENCOUNTER — Inpatient Hospital Stay (HOSPITAL_COMMUNITY)
Admission: EM | Admit: 2019-09-24 | Discharge: 2019-09-27 | DRG: 551 | Disposition: A | Discharge status: Home / Self Care | Payer: Medicaid Other | Attending: Internal Medicine | Admitting: Internal Medicine

## 2019-09-24 ENCOUNTER — Other Ambulatory Visit: Payer: Self-pay

## 2019-09-24 ENCOUNTER — Emergency Department (HOSPITAL_COMMUNITY): Payer: Medicaid Other

## 2019-09-24 DIAGNOSIS — R202 Paresthesia of skin: Secondary | ICD-10-CM | POA: Diagnosis present

## 2019-09-24 DIAGNOSIS — D631 Anemia in chronic kidney disease: Secondary | ICD-10-CM | POA: Diagnosis present

## 2019-09-24 DIAGNOSIS — M50322 Other cervical disc degeneration at C5-C6 level: Principal | ICD-10-CM | POA: Diagnosis present

## 2019-09-24 DIAGNOSIS — Z20822 Contact with and (suspected) exposure to covid-19: Secondary | ICD-10-CM | POA: Diagnosis present

## 2019-09-24 DIAGNOSIS — N186 End stage renal disease: Secondary | ICD-10-CM | POA: Diagnosis not present

## 2019-09-24 DIAGNOSIS — I4891 Unspecified atrial fibrillation: Secondary | ICD-10-CM | POA: Diagnosis present

## 2019-09-24 DIAGNOSIS — E785 Hyperlipidemia, unspecified: Secondary | ICD-10-CM | POA: Diagnosis not present

## 2019-09-24 DIAGNOSIS — G629 Polyneuropathy, unspecified: Secondary | ICD-10-CM

## 2019-09-24 DIAGNOSIS — Z888 Allergy status to other drugs, medicaments and biological substances status: Secondary | ICD-10-CM

## 2019-09-24 DIAGNOSIS — Z79899 Other long term (current) drug therapy: Secondary | ICD-10-CM

## 2019-09-24 DIAGNOSIS — Z992 Dependence on renal dialysis: Secondary | ICD-10-CM

## 2019-09-24 DIAGNOSIS — M5416 Radiculopathy, lumbar region: Secondary | ICD-10-CM

## 2019-09-24 DIAGNOSIS — E875 Hyperkalemia: Secondary | ICD-10-CM | POA: Diagnosis not present

## 2019-09-24 DIAGNOSIS — Z801 Family history of malignant neoplasm of trachea, bronchus and lung: Secondary | ICD-10-CM

## 2019-09-24 DIAGNOSIS — E1142 Type 2 diabetes mellitus with diabetic polyneuropathy: Secondary | ICD-10-CM | POA: Diagnosis present

## 2019-09-24 DIAGNOSIS — Z833 Family history of diabetes mellitus: Secondary | ICD-10-CM

## 2019-09-24 DIAGNOSIS — Z7982 Long term (current) use of aspirin: Secondary | ICD-10-CM

## 2019-09-24 DIAGNOSIS — F17210 Nicotine dependence, cigarettes, uncomplicated: Secondary | ICD-10-CM | POA: Diagnosis present

## 2019-09-24 DIAGNOSIS — F4310 Post-traumatic stress disorder, unspecified: Secondary | ICD-10-CM | POA: Diagnosis present

## 2019-09-24 DIAGNOSIS — I132 Hypertensive heart and chronic kidney disease with heart failure and with stage 5 chronic kidney disease, or end stage renal disease: Secondary | ICD-10-CM | POA: Diagnosis present

## 2019-09-24 DIAGNOSIS — I5042 Chronic combined systolic (congestive) and diastolic (congestive) heart failure: Secondary | ICD-10-CM | POA: Diagnosis present

## 2019-09-24 DIAGNOSIS — Z8249 Family history of ischemic heart disease and other diseases of the circulatory system: Secondary | ICD-10-CM

## 2019-09-24 DIAGNOSIS — Z89512 Acquired absence of left leg below knee: Secondary | ICD-10-CM

## 2019-09-24 DIAGNOSIS — R531 Weakness: Secondary | ICD-10-CM

## 2019-09-24 DIAGNOSIS — I739 Peripheral vascular disease, unspecified: Secondary | ICD-10-CM

## 2019-09-24 DIAGNOSIS — E1122 Type 2 diabetes mellitus with diabetic chronic kidney disease: Secondary | ICD-10-CM | POA: Diagnosis present

## 2019-09-24 DIAGNOSIS — Z89511 Acquired absence of right leg below knee: Secondary | ICD-10-CM

## 2019-09-24 DIAGNOSIS — E1121 Type 2 diabetes mellitus with diabetic nephropathy: Secondary | ICD-10-CM

## 2019-09-24 DIAGNOSIS — M898X9 Other specified disorders of bone, unspecified site: Secondary | ICD-10-CM | POA: Diagnosis present

## 2019-09-24 DIAGNOSIS — D539 Nutritional anemia, unspecified: Secondary | ICD-10-CM

## 2019-09-24 DIAGNOSIS — Z7901 Long term (current) use of anticoagulants: Secondary | ICD-10-CM

## 2019-09-24 DIAGNOSIS — R200 Anesthesia of skin: Secondary | ICD-10-CM | POA: Diagnosis present

## 2019-09-24 LAB — COMPREHENSIVE METABOLIC PANEL
ALT: 17 U/L (ref 0–44)
AST: 18 U/L (ref 15–41)
Albumin: 3.8 g/dL (ref 3.5–5.0)
Alkaline Phosphatase: 115 U/L (ref 38–126)
Anion gap: 14 (ref 5–15)
BUN: 41 mg/dL — ABNORMAL HIGH (ref 6–20)
CO2: 30 mmol/L (ref 22–32)
Calcium: 7.5 mg/dL — ABNORMAL LOW (ref 8.9–10.3)
Chloride: 93 mmol/L — ABNORMAL LOW (ref 98–111)
Creatinine, Ser: 8.15 mg/dL — ABNORMAL HIGH (ref 0.61–1.24)
GFR calc Af Amer: 8 mL/min — ABNORMAL LOW (ref >60)
GFR calc non Af Amer: 7 mL/min — ABNORMAL LOW (ref >60)
Glucose, Bld: 123 mg/dL — ABNORMAL HIGH (ref 70–99)
Potassium: 5.4 mmol/L — ABNORMAL HIGH (ref 3.5–5.1)
Sodium: 137 mmol/L (ref 135–145)
Total Bilirubin: 0.4 mg/dL (ref 0.3–1.2)
Total Protein: 7.7 g/dL (ref 6.5–8.1)

## 2019-09-24 LAB — DIFFERENTIAL
Abs Immature Granulocytes: 0.01 10*3/uL (ref 0.00–0.07)
Basophils Absolute: 0.1 10*3/uL (ref 0.0–0.1)
Basophils Relative: 1 %
Eosinophils Absolute: 0.3 10*3/uL (ref 0.0–0.5)
Eosinophils Relative: 4 %
Immature Granulocytes: 0 %
Lymphocytes Relative: 26 %
Lymphs Abs: 1.8 10*3/uL (ref 0.7–4.0)
Monocytes Absolute: 0.5 10*3/uL (ref 0.1–1.0)
Monocytes Relative: 8 %
Neutro Abs: 4.4 10*3/uL (ref 1.7–7.7)
Neutrophils Relative %: 61 %

## 2019-09-24 LAB — CBC
HCT: 36.4 % — ABNORMAL LOW (ref 39.0–52.0)
Hemoglobin: 11.4 g/dL — ABNORMAL LOW (ref 13.0–17.0)
MCH: 33.4 pg (ref 26.0–34.0)
MCHC: 31.3 g/dL (ref 30.0–36.0)
MCV: 106.7 fL — ABNORMAL HIGH (ref 80.0–100.0)
Platelets: 228 10*3/uL (ref 150–400)
RBC: 3.41 MIL/uL — ABNORMAL LOW (ref 4.22–5.81)
RDW: 13.5 % (ref 11.5–15.5)
WBC: 7.1 10*3/uL (ref 4.0–10.5)
nRBC: 0 % (ref 0.0–0.2)

## 2019-09-24 LAB — PROTIME-INR
INR: 1.1 (ref 0.8–1.2)
Prothrombin Time: 13.7 seconds (ref 11.4–15.2)

## 2019-09-24 LAB — APTT: aPTT: 37 seconds — ABNORMAL HIGH (ref 24–36)

## 2019-09-24 LAB — ETHANOL: Alcohol, Ethyl (B): <10 mg/dL (ref <10)

## 2019-09-24 LAB — SARS CORONAVIRUS 2 BY RT PCR (HOSPITAL ORDER, PERFORMED IN ~~LOC~~ HOSPITAL LAB): SARS Coronavirus 2: NEGATIVE

## 2019-09-24 IMAGING — CT CT HEAD W/O CM
3 series · 15 of 47 positions shown, 18 images · non-contrast
Comparison: Head CT and brain MRI [DATE]

CLINICAL DATA: Stroke, follow up

Left-sided pain and weakness onset this morning.
EXAM:
CT HEAD WITHOUT CONTRAST
TECHNIQUE: Contiguous axial images were obtained from the base of the skull
through the vertex without intravenous contrast.

[Series 2: head w o · axial · 0.46mm/px · z∈[+1352,+1497]mm · 9 of 35 slices shown, 12 images]
[im 3/35  brain]
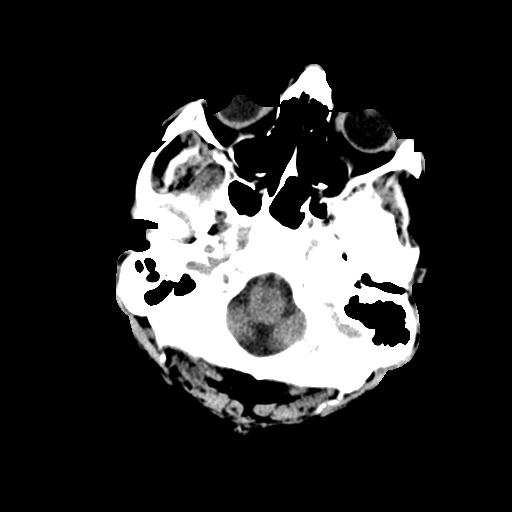
[im 3/35  bone]
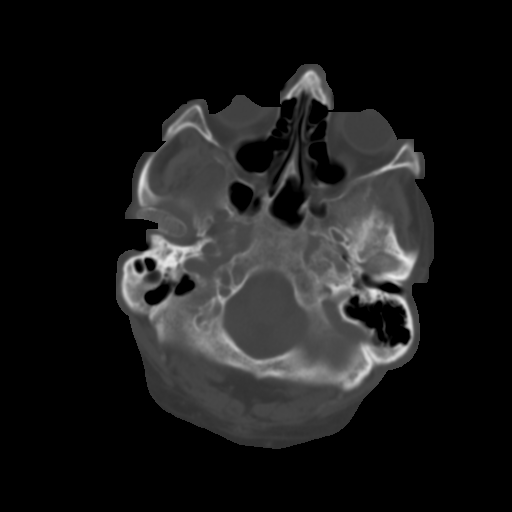
[im 6/35  brain]
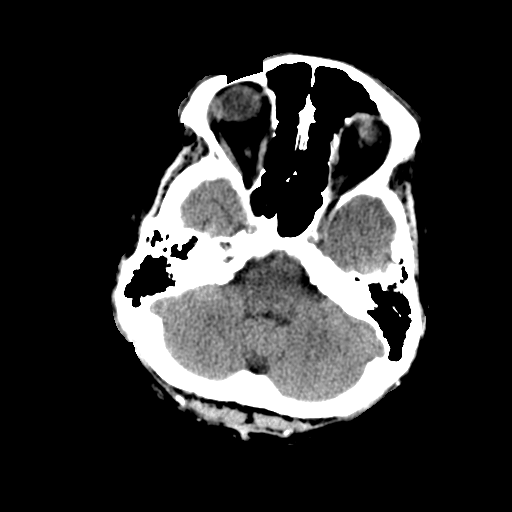
[im 10/35  brain]
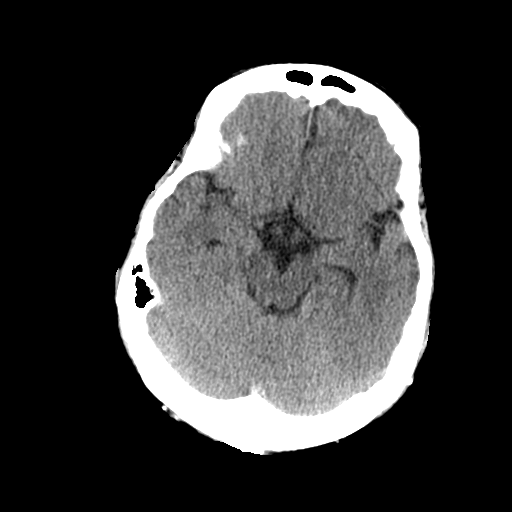
[im 13/35  brain]
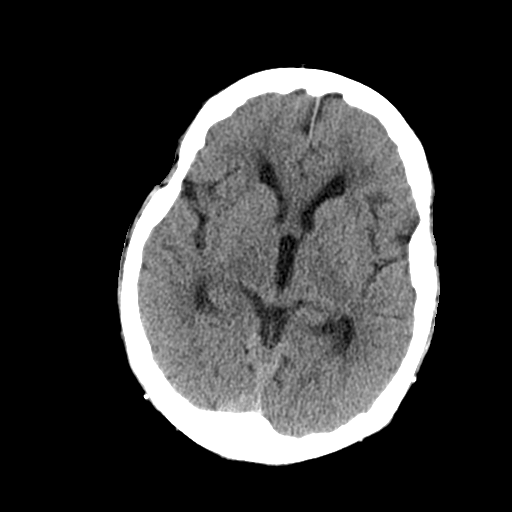
[im 18/35  brain]
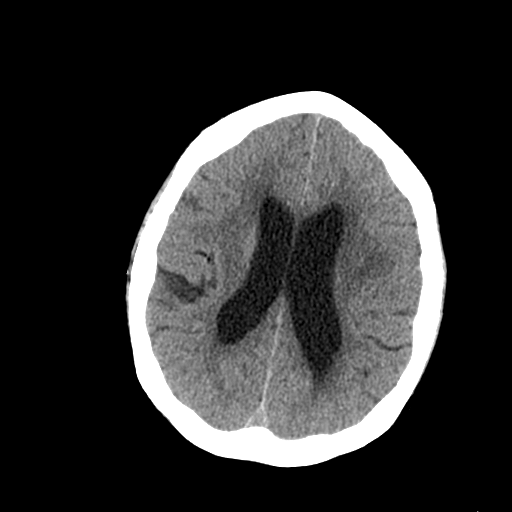
[im 18/35  bone]
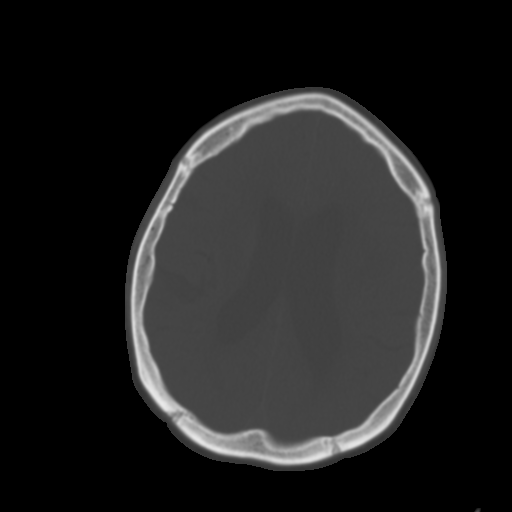
[im 22/35  brain]
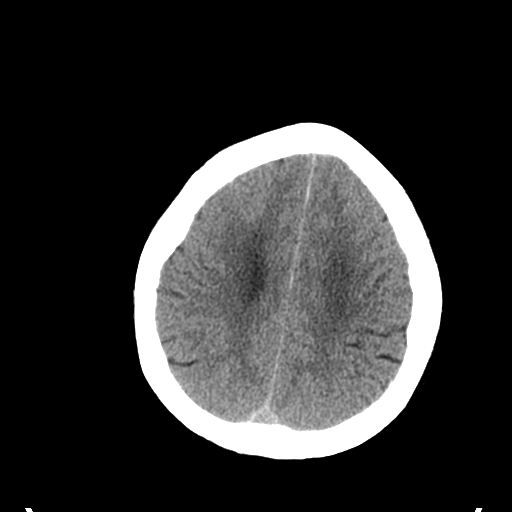
[im 25/35  brain]
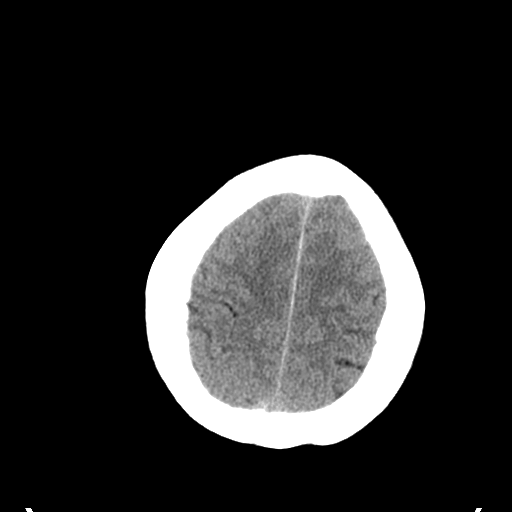
[im 29/35  brain]
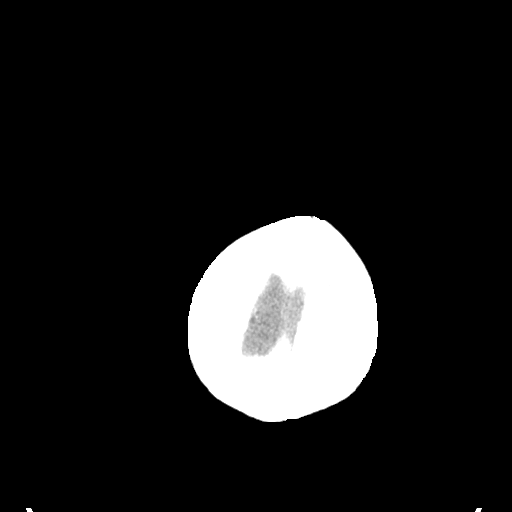
[im 32/35  brain]
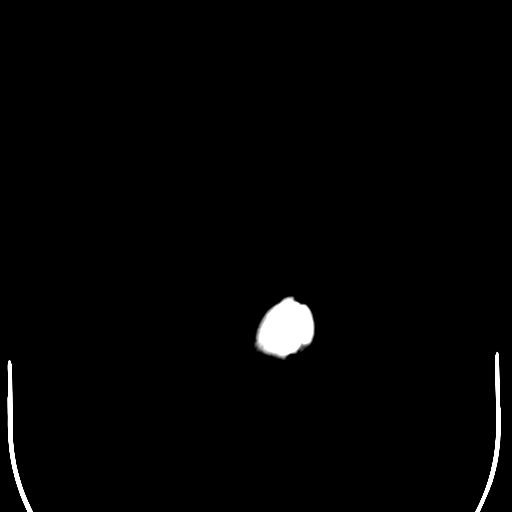
[im 32/35  bone]
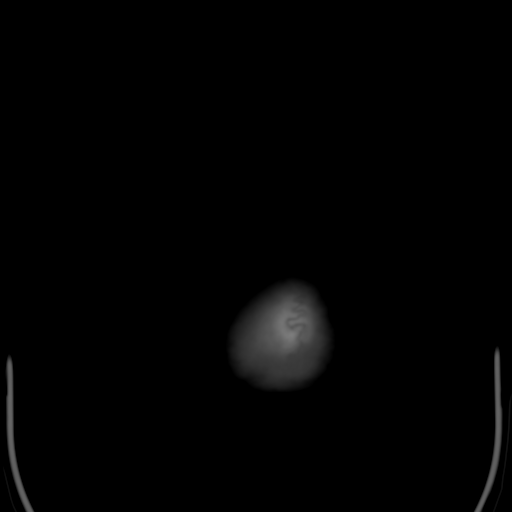

[Series 4: coronal soft · coronal · 0.33mm/px · 3 of 66 slices shown]
[im 22/66  brain]
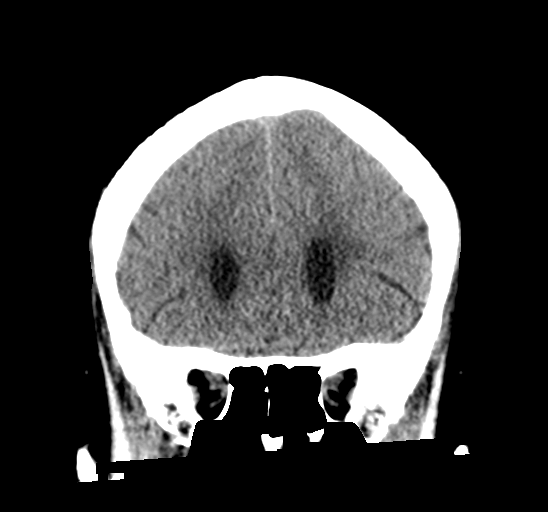
[im 29/66  brain]
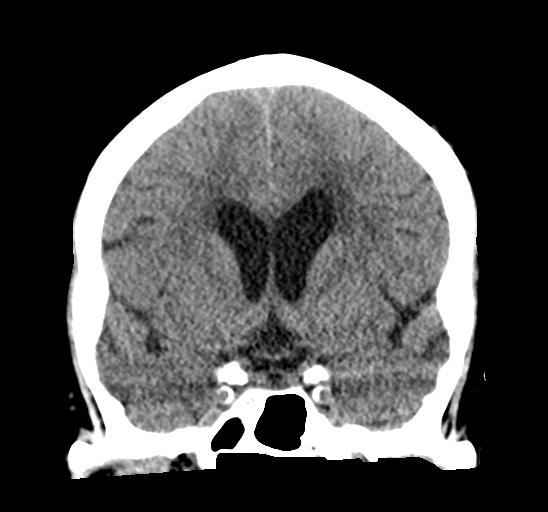
[im 37/66  brain]
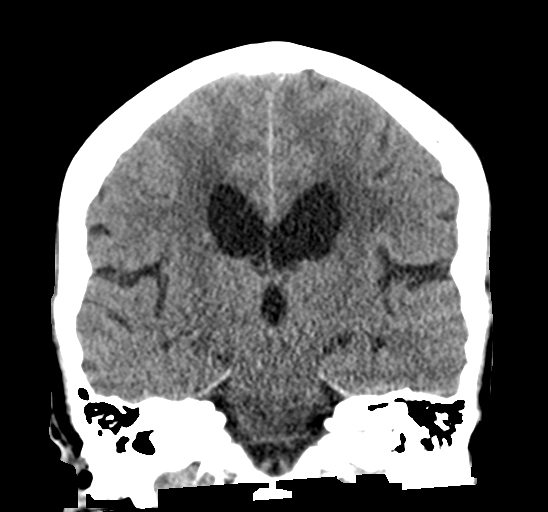

[Series 5: sagittal soft · sagittal · 0.32mm/px · 3 of 52 slices shown]
[im 18/52  brain]
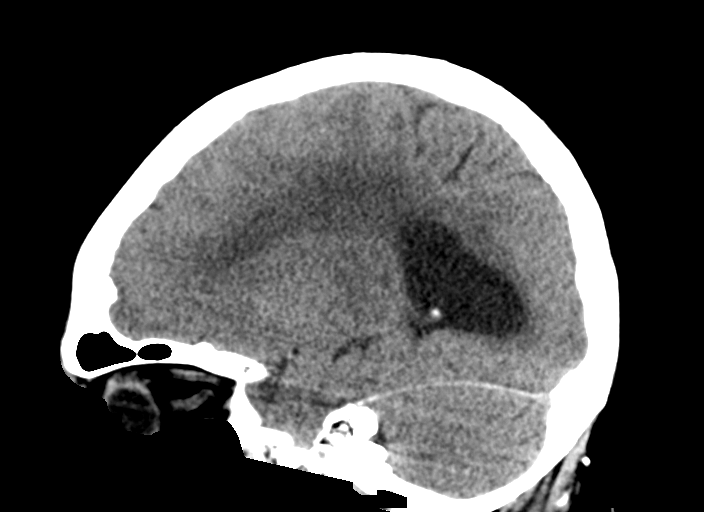
[im 26/52  brain]
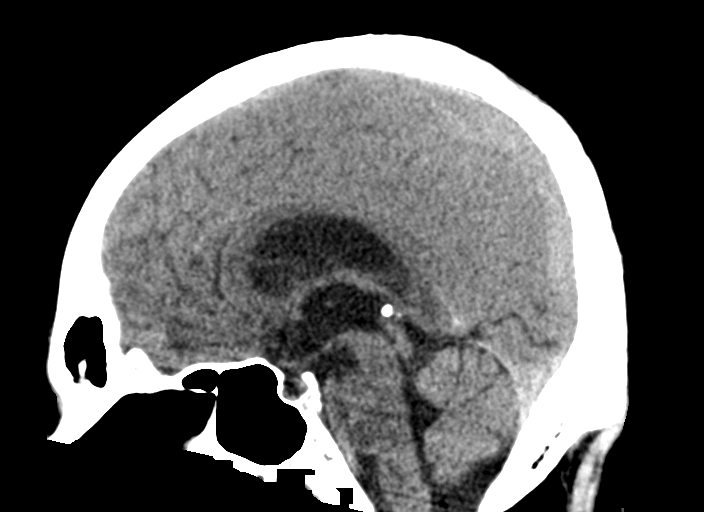
[im 35/52  brain]
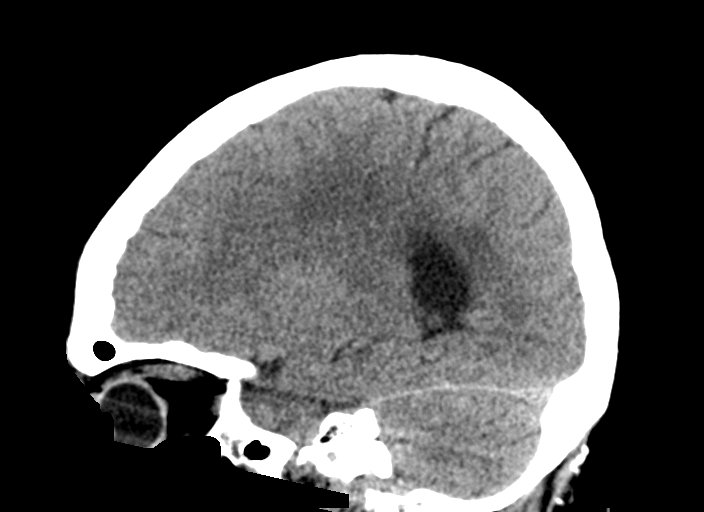

[15 of 47 positions shown; findings below may reference images not displayed]

FINDINGS: Brain: Stable degree of atrophy and chronic small vessel ischemia.
No intracranial hemorrhage, mass effect, or midline shift. No
hydrocephalus. The basilar cisterns are patent. No evidence of
territorial infarct or acute ischemia. No extra-axial or
intracranial fluid collection.

Vascular: Atherosclerosis of skullbase vasculature without
hyperdense vessel or abnormal calcification.

Skull: No fracture or focal lesion.

Sinuses/Orbits: Opacification of a single right mastoid air cell is
new from prior exam. Mastoid air cells are otherwise clear.
Paranasal sinuses are clear. Orbits are unremarkable.

Other: None.
IMPRESSION: 1. No acute intracranial abnormality. No hemorrhage or evidence of
acute ischemia.
2. Stable atrophy and chronic small vessel ischemia.

## 2019-09-24 MED ORDER — HEPARIN SODIUM (PORCINE) 5000 UNIT/ML IJ SOLN
5000.0000 [IU] | Freq: Three times a day (TID) | INTRAMUSCULAR | Status: DC
  Administered 2019-09-24: 5000 [IU] via SUBCUTANEOUS

## 2019-09-24 NOTE — Consult Note (Signed)
[facility] Tele[facility]rology Consult Services  Stat Consult  Date of Service:   09/24/2019 22:31:56  Impression:       R20.2 - Paresthesia of skin  Comments/Sign-Out: The patient is a 49 year old woman with a history of diabetes, atrial fibrillation on anticoagulation, ESRD on HD, CHF, R BKA, who presents with L arm and leg pain and numbness. Low suspicion CVA, especially given negative workup 1 month ago. Recommend further evaluation with MRI C and left spine without contrast. If negative, plan outpatient EMG.  CT HEAD: Reviewed IMPRESSION: 1. No acute intracranial abnormality. No hemorrhage or evidence of acute ischemia. 2. Stable atrophy and chronic small vessel ischemia.  Metrics: [facility] Notification Time: 09/24/2019 22:30:45 Stamp Time: 09/24/2019 22:31:56 Callback Response Time: 09/24/2019 22:34:52  Our recommendations are outlined below.   Sign Out:       Discussed with Emergency Department Provider  ----------------------------------------------------------------------------------------------------  Chief Complaint: left sided weakness  History of Present Illness: Patient is a 49 year old Male.  Patient presents with left sided numbness and pain. For past week has been having on and off numbness and pain in the left arm and leg. Most severe upon awakening this AM at 6 AM, felt like left arm and leg were dead. Complaining of shooting pains down arm and leg. Complains of pain in left hand going up to AV graft. Able to ambulate but felt dizziness. Similar to symptoms he had back in June, underwent CVA workup with negative MRI brain, C-US, TTE. Past medical history includes CHF, ESRD, R below knee amputation   Past Medical History:      Diabetes Mellitus      Atrial Fibrillation  Anticoagulant use:  Elaquis     Examination: BP(122/88), Pulse(81), Blood Glucose(123) 1A: Level of Consciousness - Alert; keenly responsive + 0 1B: Ask  Month and Age - Both Questions Right + 0 1C: Blink Eyes & Squeeze Hands - Performs Both Tasks + 0 2: Test Horizontal Extraocular Movements - Normal + 0 3: Test Visual Fields - No Visual Loss + 0 4: Test Facial Palsy (Use Grimace if Obtunded) - Minor paralysis (flat nasolabial fold, smile asymmetry) + 1 5A: Test Left Arm Motor Drift - No Drift for 10 Seconds + 0 5B: Test Right Arm Motor Drift - No Drift for 10 Seconds + 0 6A: Test Left Leg Motor Drift - No Drift for 5 Seconds + 0 6B: Test Right Leg Motor Drift - No Drift for 5 Seconds + 0 7: Test Limb Ataxia (FNF/Heel-Shin) - No Ataxia + 0 8: Test Sensation - Mild-Moderate Loss: Less Sharp/More Dull + 1 9: Test Language/Aphasia - Normal; No aphasia + 0 10: Test Dysarthria - Normal + 0 11: Test Extinction/Inattention - No abnormality + 0  NIHSS Score: 2    Patient is being evaluated for possible acute neurologic impairment and high probability of imminent or life-threatening deterioration. I spent total of 25 minutes providing care to this patient, including time for face to face visit via telemedicine, review of medical records, imaging studies and discussion of findings with providers, the patient and/or family.   Dr Serita Grammes   [facility] [phone]  Case [id number]

## 2019-09-24 NOTE — ED Triage Notes (Signed)
Pt c/o left sided pain and weakness since about 0600 this morning.

## 2019-09-24 NOTE — ED Provider Notes (Signed)
Emergency Department Provider Note   I have reviewed the triage vital signs and the nursing notes.   HISTORY  Chief Complaint Weakness   HPI Brendan Campbell is a 49 y.o. male with past medical history diabetic neuropathy, end-stage renal disease, hypocalcemia presents to the emergency department with acute onset left side pain and weakness.  This is similar to presentation he had June of this year which patient states was ultimately blamed on low calcium.  He tells me that he was last normal at 2 AM this morning when he woke up from sleep to get something to drink.  He went back to bed and woke up around 6 AM with weakness and pain in the left arm and leg radiating to the left face.  He denies any speech or vision change.  He is feeling weak in the left arm and leg and feels like his fingers are clinched in a fist. He denies any fever or chills. He last had HD yesterday. He thought about waiting until the AM to go to HD but decided to present to the ED with continued symptoms. No radiation of symptoms or modifying factors.   Past Medical History:  Diagnosis Date  . Anemia   . Anxiety   . Blood transfusion without reported diagnosis   . CHF (congestive heart failure) (HCC)    a. EF 25% by echo in 07/2019  . Chronic kidney disease   . Depression   . Diabetes mellitus without complication ([name])   . End stage renal disease ([name])    M/W/F [facility]  . Hyperlipidemia   . Neuropathy   . Peripheral vascular disease ([place])   . PTSD (post-traumatic stress disorder)     Patient Active Problem List   Diagnosis Date Noted  . Hypocalcemia   . Atrial fibrillation, chronic ([place])   . Hyperparathyroidism ([place])   . Left sided numbness 08/02/2019  . Cardiac arrest ([place]) 06/07/2019  . Atrial flutter ([place])   . CHF (congestive heart failure) ([place])   . Neuropathy   . Peripheral vascular disease ([place])   . Abnormal EKG   . Leukocytosis   . Atypical pneumonia   . ESRD needing dialysis ([place])     . Acute pulmonary edema (HCC)   . ESRD on hemodialysis ([place])   . Hyperkalemia 10/05/2018  . Weakness 10/05/2018  . Chest pain 05/10/2016  . Anemia due to end stage renal disease ([place]) 05/10/2016  . 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 Lumbar radiculopathy 03/08/2016  . S/P below knee amputation, right ([place]) 03/05/2015  . Type 2 diabetes with nephropathy ([place]) 03/03/2015  . HLD (hyperlipidemia) 03/03/2015    Past Surgical History:  Procedure Laterality Date  . A/V FISTULAGRAM N/A 10/09/2018   Procedure: A/V FISTULAGRAM;  Surgeon: Serafina Mitchell, MD;  Location: [place] [facility];  Service: Cardiovascular;  Laterality: N/A;  . AV FISTULA PLACEMENT Left 09/22/2016   Procedure: CREATION OF LEFT ARM ARTERIOVENOUS (AV) FISTULA;  Surgeon: Waynetta Sandy, MD;  Location: [place];  Service: Vascular;  Laterality: Left;  . AV FISTULA PLACEMENT Left 10/31/2017   Procedure: INSERTION OF ARTERIOVENOUS (AV) GORE-TEX 4-66mm STETCH GRAFT LEFT ARM;  Surgeon: Waynetta Sandy, MD;  Location: [place];  Service: Vascular;  Laterality: Left;  . BASCILIC VEIN TRANSPOSITION Left 08/17/2017   Procedure: SECOND STAGE BASILIC VEIN TRANSPOSITION LEFT ARM;  Surgeon: Waynetta Sandy, MD;  Location: [facility];  Service: Vascular;  Laterality: Left;  . BELOW KNEE  LEG AMPUTATION Right   . CARDIOVERSION N/A 02/19/2019   Procedure: CARDIOVERSION;  Surgeon: [name], MD;  Location: [facility] ENDOSCOPY;  Service: Cardiovascular;  Laterality: N/A;  . CHOLECYSTECTOMY    . FOOT SURGERY    . IR FLUORO GUIDE CV LINE RIGHT  05/16/2016  . IR FLUORO GUIDE CV LINE RIGHT  02/16/2019  . IR REMOVAL TUN CV CATH W/O FL  05/16/2016  . IR REMOVAL TUN CV CATH W/O FL  02/19/2019  . IR THROMBECTOMY AV FISTULA W/THROMBOLYSIS/PTA INC/SHUNT/IMG LEFT Left 11/09/2018  . IR THROMBECTOMY AV FISTULA W/THROMBOLYSIS/PTA INC/SHUNT/IMG LEFT Left 06/22/2019  . IR US GUIDE VASC ACCESS LEFT  11/09/2018  . IR US GUIDE VASC ACCESS LEFT   06/22/2019  . IR US GUIDE VASC ACCESS RIGHT  05/16/2016  . IR US GUIDE VASC ACCESS RIGHT  02/16/2019  . PERIPHERAL VASCULAR BALLOON ANGIOPLASTY  10/09/2018   Procedure: PERIPHERAL VASCULAR BALLOON ANGIOPLASTY;  Surgeon: Serafina Mitchell, MD;  Location: [place] [facility];  Service: Cardiovascular;;  . TEE WITHOUT CARDIOVERSION N/A 02/19/2019   Procedure: TRANSESOPHAGEAL ECHOCARDIOGRAM (TEE);  Surgeon: [name], MD;  Location: [facility] ENDOSCOPY;  Service: Cardiovascular;  Laterality: N/A;    Allergies Tape  Family History  Problem Relation Age of Onset  . Cancer Mother        lung  . Diabetes Mother   . Heart attack Father   . Diabetes Father   . Diabetes Sister     Social History Social History   Tobacco Use  . Smoking status: Current Some Day Smoker    Last attempt to quit: 09/03/2006    Years since quitting: 13.0  . Smokeless tobacco: Never Used  Vaping Use  . Vaping Use: Never used  Substance Use Topics  . Alcohol use: No  . Drug use: No    Review of Systems  Constitutional: No fever/chills Eyes: No visual changes. ENT: No sore throat. Cardiovascular: Denies chest pain. Respiratory: Denies shortness of breath. Gastrointestinal: No abdominal pain.  No nausea, no vomiting.  No diarrhea.  No constipation. Genitourinary: Negative for dysuria. Musculoskeletal: Negative for back pain. Skin: Negative for rash. Neurological: Positive HA. Positive left arm and leg weakness with pain.   10-point ROS otherwise negative.  ____________________________________________   PHYSICAL EXAM:  VITAL SIGNS: ED Triage Vitals  Enc Vitals Group     BP 09/24/19 2009 122/78     Pulse Rate 09/24/19 2009 81     Resp 09/24/19 2009 14     Temp 09/24/19 2009 98.3 F (36.8 C)     Temp Source 09/24/19 2009 Oral     SpO2 09/24/19 2009 100 %     Weight 09/24/19 2008 217 lb (98.4 kg)     Height 09/24/19 2008 6' (1.829 m)   Constitutional: Alert and oriented. Well appearing and in no  acute distress. Eyes: Conjunctivae are normal. PERRL. EOMI. Head: Atraumatic. Nose: No congestion/rhinnorhea. Mouth/Throat: Mucous membranes are moist.  Neck: No stridor. Cardiovascular: Normal rate, regular rhythm. Good peripheral circulation. Grossly normal heart sounds.   Respiratory: Normal respiratory effort.  No retractions. Lungs CTAB. Gastrointestinal: Soft and nontender. No distention.  Musculoskeletal: Right BKA noted.  Neurologic:  Normal speech and language. No facial asymmetry or numbness. 4/5 strength in the LUE and LLE. Normal sensation.  Skin:  Skin is warm, dry and intact. No rash noted.   ____________________________________________   LABS (all labs ordered are listed, but only abnormal results are displayed)  Labs Reviewed  APTT -  Abnormal; Notable for the following components:      Result Value   aPTT 37 (*)    All other components within normal limits  CBC - Abnormal; Notable for the following components:   RBC 3.41 (*)    Hemoglobin 11.4 (*)    HCT 36.4 (*)    MCV 106.7 (*)    All other components within normal limits  COMPREHENSIVE METABOLIC PANEL - Abnormal; Notable for the following components:   Potassium 5.4 (*)    Chloride 93 (*)    Glucose, Bld 123 (*)    BUN 41 (*)    Creatinine, Ser 8.15 (*)    Calcium 7.5 (*)    GFR calc non Af Amer 7 (*)    GFR calc Af Amer 8 (*)    All other components within normal limits  SARS CORONAVIRUS 2 BY RT PCR (HOSPITAL ORDER, [place] [facility])  ETHANOL  PROTIME-INR  DIFFERENTIAL  RAPID URINE DRUG SCREEN, HOSP PERFORMED  URINALYSIS, ROUTINE W REFLEX MICROSCOPIC   ____________________________________________  EKG  None ____________________________________________  RADIOLOGY  CT HEAD WO CONTRAST  Result Date: 09/24/2019 CLINICAL DATA:  Stroke, follow up Left-sided pain and weakness onset this morning. EXAM: CT HEAD WITHOUT CONTRAST TECHNIQUE: Contiguous axial images were obtained  from the base of the skull through the vertex without intravenous contrast. COMPARISON:  Head CT and brain MRI 08/02/2019 FINDINGS: Brain: Stable degree of atrophy and chronic small vessel ischemia. No intracranial hemorrhage, mass effect, or midline shift. No hydrocephalus. The basilar cisterns are patent. No evidence of territorial infarct or acute ischemia. No extra-axial or intracranial fluid collection. Vascular: Atherosclerosis of skullbase vasculature without hyperdense vessel or abnormal calcification. Skull: No fracture or focal lesion. Sinuses/Orbits: Opacification of a single right mastoid air cell is new from prior exam. Mastoid air cells are otherwise clear. Paranasal sinuses are clear. Orbits are unremarkable. Other: None. IMPRESSION: 1. No acute intracranial abnormality. No hemorrhage or evidence of acute ischemia. 2. Stable atrophy and chronic small vessel ischemia. Electronically Signed   By: Keith Rake M.D.   On: 09/24/2019 21:54    ____________________________________________   PROCEDURES  Procedure(s) performed:   Procedures  CRITICAL CARE Performed by: [name] Total critical care time: 35 minutes Critical care time was exclusive of separately billable procedures and treating other patients. Critical care was necessary to treat or prevent imminent or life-threatening deterioration. Critical care was time spent personally by me on the following activities: development of treatment plan with patient and/or surrogate as well as nursing, discussions with consultants, evaluation of patient's response to treatment, examination of patient, obtaining history from patient or surrogate, ordering and performing treatments and interventions, ordering and review of laboratory studies, ordering and review of radiographic studies, pulse oximetry and re-evaluation of patient's condition.  Nanda Quinton, MD Emergency  Medicine  ____________________________________________   INITIAL IMPRESSION / ASSESSMENT AND PLAN / ED COURSE  Pertinent labs & imaging results that were available during my care of the patient were reviewed by me and considered in my medical decision making (see chart for details).   Patient presents to the emergency department for evaluation of left arm and leg weakness.  He had similar episode in June and was admitted for TIA.  Ultimately, patient also found to be hypocalcemic and symptoms improved with electrolyte replacement.  Likely similar presentation here today.  Calcium is at 7.5 but this seems to be near his baseline.  In his admission in June he  was at around 6.5.  Potassium is also mildly elevated.   11:00 AM  Discussed case with teleneurology. Atypical for CVA. They recommend spine MRI. No MRI at this time. Given acute onset, lingering deficits will admit for obs pending MRI and electrolyte mgmt.   Discussed patient's case with [facility] to request admission. Patient and family (if present) updated with plan. Care transferred to [facility] service.  I reviewed all nursing notes, vitals, pertinent old records, EKGs, labs, imaging (as available).  ____________________________________________  FINAL CLINICAL IMPRESSION(S) / ED DIAGNOSES  Final diagnoses:  Left-sided weakness  Hypocalcemia     Note:  This document was prepared using Dragon voice recognition software and may include unintentional dictation errors.  Nanda Quinton, MD, [facility] Emergency Medicine    Aston Lieske, Wonda Old[facility], MD 09/25/19 [phone]

## 2019-09-24 NOTE — H&P (Signed)
History and Physical  Brendan Campbell JGG:[id number] DOB: [birth year] DOA: 09/24/2019  Referring physician: Nanda Quinton , MD PCP: Monico Blitz, MD  Outpatient Specialists: Cardiology [facility] Alphonse Guild, MD) Patient coming from: Home  Chief Complaint: Left-sided weakness  HPI: Brendan Campbell is a 49 y.o. male with medical history significant for  hypertension, hyperlipidemia, diabetes mellitus type 2, ESRD on hemodialysis, atrial fibrillation on anticoagulation with Eliquis, right BKA, diabetic neuropathy and hypocalcemia who presents to the  emergency department due to left arm and leg pain and numbness which started on awakening around 6 AM today.  Patient states " I feel like my left arm and leg were dead" and he complained of pain that starts from left leg and shoots upward to his left arm.  Patient states that he had a similar presentation in June during which stroke work-up was done and had negative MRI, echocardiogram and carotid ultrasound.  It was then thought to be due to low calcium level.  Patient states that he does have occasional sensation of numbness in extremities including right arm (though usually worse on the left) and this improves transitorily when he takes Tums, he started taking over-the-counter calcium pills which appeared to help as well but improvement was only short-term.  He denies fever, chills, nausea, vomiting, abdominal pain.  ED Course:  In the emergency department, he was hemodynamically stable.  Work-up in ED showed macrocytic anemia, hyperkalemia, BSL creatinine 41/8.15.  SARS coronavirus 2 was negative.  CT 8 without contrast showed no acute intracranial abnormality. No hemorrhage or evidence of acute ischemia. Teleneurologist was consulted and recommended MRI of cervical and lumbar spine.  Hospitalist was asked to admit patient for further evaluation and management.   Review of Systems: Constitutional: Negative for chills and fever.  HENT: Negative for ear pain  and sore throat.   Eyes: Negative for pain and visual disturbance.  Respiratory: Negative for cough, chest tightness and shortness of breath.   Cardiovascular: Negative for chest pain and palpitations.  Gastrointestinal: Negative for abdominal pain and vomiting.  Endocrine: Negative for polyphagia and polyuria.  Genitourinary: Negative for decreased urine volume Musculoskeletal: Positive for right BKA.  Negative for arthralgias and back pain.  Skin: Negative for color change and rash.  Allergic/Immunologic: Negative for immunocompromised state.  Neurological: Positive for left arm leg weakness with pain.  Negative for tremors, syncope, speech difficulty Hematological: Does not bruise/bleed easily.  All other systems reviewed and are negative   Past Medical History:  Diagnosis Date   Anemia    Anxiety    Blood transfusion without reported diagnosis    CHF (congestive heart failure) (HCC)    a. EF 25% by echo in 07/2019   Chronic kidney disease    Depression    Diabetes mellitus without complication ([place])    End stage renal disease ([place])    M/W/F [facility]   Hyperlipidemia    Neuropathy    Peripheral vascular disease (HCC)    PTSD (post-traumatic stress disorder)    Past Surgical History:  Procedure Laterality Date   A/V FISTULAGRAM N/A 10/09/2018   Procedure: A/V FISTULAGRAM;  Surgeon: Serafina Mitchell, MD;  Location: [place] [facility];  Service: Cardiovascular;  Laterality: N/A;   AV FISTULA PLACEMENT Left 09/22/2016   Procedure: CREATION OF LEFT ARM ARTERIOVENOUS (AV) FISTULA;  Surgeon: Waynetta Sandy, MD;  Location: [place];  Service: Vascular;  Laterality: Left;   AV FISTULA PLACEMENT Left 10/31/2017   Procedure: INSERTION OF ARTERIOVENOUS (  AV) GORE-TEX 4-60mm STETCH GRAFT LEFT ARM;  Surgeon: Waynetta Sandy, MD;  Location: [place];  Service: Vascular;  Laterality: Left;   [place] Left 08/17/2017   Procedure: SECOND STAGE  BASILIC VEIN TRANSPOSITION LEFT ARM;  Surgeon: Waynetta Sandy, MD;  Location: [place];  Service: Vascular;  Laterality: Left;   BELOW KNEE LEG AMPUTATION Right    CARDIOVERSION N/A 02/19/2019   Procedure: CARDIOVERSION;  Surgeon: [name], MD;  Location: [facility] ENDOSCOPY;  Service: Cardiovascular;  Laterality: N/A;   CHOLECYSTECTOMY     FOOT SURGERY     IR FLUORO GUIDE CV LINE RIGHT  05/16/2016   IR FLUORO GUIDE CV LINE RIGHT  02/16/2019   IR REMOVAL TUN CV CATH W/O FL  05/16/2016   IR REMOVAL TUN CV CATH W/O FL  02/19/2019   IR THROMBECTOMY AV FISTULA W/THROMBOLYSIS/PTA INC/SHUNT/IMG LEFT Left 11/09/2018   IR THROMBECTOMY AV FISTULA W/THROMBOLYSIS/PTA INC/SHUNT/IMG LEFT Left 06/22/2019   IR US GUIDE VASC ACCESS LEFT  11/09/2018   IR US GUIDE VASC ACCESS LEFT  06/22/2019   IR US GUIDE VASC ACCESS RIGHT  05/16/2016   IR US GUIDE VASC ACCESS RIGHT  02/16/2019   PERIPHERAL VASCULAR BALLOON ANGIOPLASTY  10/09/2018   Procedure: PERIPHERAL VASCULAR BALLOON ANGIOPLASTY;  Surgeon: Serafina Mitchell, MD;  Location: [place] [facility];  Service: Cardiovascular;;   TEE WITHOUT CARDIOVERSION N/A 02/19/2019   Procedure: TRANSESOPHAGEAL ECHOCARDIOGRAM (TEE);  Surgeon: [name], MD;  Location: [facility] ENDOSCOPY;  Service: Cardiovascular;  Laterality: N/A;    Social History:  reports that he has been smoking. He has never used smokeless tobacco. He reports that he does not drink alcohol and does not use drugs.   Allergies  Allergen Reactions   Tape Other (See Comments)    Pulls skin off    Family History  Problem Relation Age of Onset   Cancer Mother        lung   Diabetes Mother    Heart attack Father    Diabetes Father    Diabetes Sister     Prior to Admission medications   Medication Sig Start Date End Date Taking? Authorizing Provider  apixaban (ELIQUIS) 5 MG TABS tablet Take 1 tablet (5 mg total) by mouth 2 (two) times daily. 08/23/19   Strader, Fransisco Hertz, PA-C    aspirin 81 MG chewable tablet Chew 81 mg by mouth daily.    [provider]  atorvastatin (LIPITOR) 10 MG tablet Take 1 tablet (10 mg total) by mouth every evening. Patient not taking: Reported on 08/23/2019 08/03/19   Barton Dubois, MD  calcium acetate (PHOSLO) 667 MG capsule Take 1 capsule (667 mg total) by mouth 3 (three) times daily with meals. 02/21/19   Donnamae Jude, MD  calcium carbonate (TUMS - DOSED IN MG ELEMENTAL CALCIUM) 500 MG chewable tablet Chew 4 tablets (800 mg of elemental calcium total) by mouth 2 (two) times daily. 08/03/19   Barton Dubois, MD  carvedilol (COREG) 3.125 MG tablet Take 1 tablet (3.125 mg total) by mouth 2 (two) times daily with a meal. 08/23/19   Strader, [place] M, PA-C  LANTUS SOLOSTAR 100 UNIT/ML Solostar Pen Inject 10 Units into the skin at bedtime.  01/18/19   [provider]  lidocaine-prilocaine (EMLA) cream Apply 1 application topically See admin instructions. Applied three times weekly at dialysis on MWF 05/06/19   [provider]  multivitamin (RENA-VIT) TABS tablet Take 1 tablet by mouth daily. 08/04/18  [provider]  patiromer (VELTASSA) 8.4 g packet Take 1 packet (8.4 g total) by mouth 3 (three) times a week. On Tuesday, Thursday and Saturday 06/10/19   Kathie Dike, MD    Physical Exam: BP 116/72    Pulse 77    Temp 98.3 F (36.8 C) (Oral)    Resp 15    Ht 6' (1.829 m)    Wt 98.4 kg    SpO2 95%    BMI 29.43 kg/m    General: 49 y.o. year-old male well developed well nourished in no acute distress.  Alert and oriented x3.  HEENT: Normocephalic, atraumatic  Neck: Supple, trachea midline  Cardiovascular: Regular rate and rhythm with no rubs or gallops.  No thyromegaly or JVD noted.  No lower extremity edema. 2/4 pulses in all 4 extremities.  Respiratory: Clear to auscultation with no wheezes or rales. Good inspiratory effort.  Abdomen: Soft nontender nondistended with normal bowel sounds x4  quadrants.  Muskuloskeletal: Right BKA noted.  No cyanosis, clubbing or edema noted bilaterally  Neuro: NIHSS-1 (sensation-less sharp/more dull +1), CN II-XII intact, strength, sensation, reflexes  Skin: No ulcerative lesions noted or rashes  Psychiatry: Judgement and insight appear normal. Mood is appropriate for condition and setting          Labs on Admission:  Basic Metabolic Panel: Recent Labs  Lab 09/24/19 2043 09/25/19 0331  NA 137 137  K 5.4* 4.7  CL 93* 97*  CO2 30 28  GLUCOSE 123* 118*  BUN 41* 45*  CREATININE 8.15* 8.52*  CALCIUM 7.5* 7.7*  MG  --  2.0  PHOS  --  5.7*   Liver Function Tests: Recent Labs  Lab 09/24/19 2043 09/25/19 0331  AST 18 16  ALT 17 17  ALKPHOS 115 113  BILITOT 0.4 0.5  PROT 7.7 6.7  ALBUMIN 3.8 3.4*   No results for input(s): LIPASE, AMYLASE in the last 168 hours. No results for input(s): AMMONIA in the last 168 hours. CBC: Recent Labs  Lab 09/24/19 2043 09/25/19 0331  WBC 7.1 7.3  NEUTROABS 4.4  --   HGB 11.4* 11.2*  HCT 36.4* 35.8*  MCV 106.7* 105.6*  PLT 228 223   Cardiac Enzymes: No results for input(s): CKTOTAL, CKMB, CKMBINDEX, TROPONINI in the last 168 hours.  BNP (last 3 results) Recent Labs    02/15/19 0500 05/16/19 0814 06/07/19 1125  BNP 3,499.0* 928.0* 3,441.0*    ProBNP (last 3 results) No results for input(s): PROBNP in the last 8760 hours.  CBG: No results for input(s): GLUCAP in the last 168 hours.  Radiological Exams on Admission: CT HEAD WO CONTRAST  Result Date: 09/24/2019 CLINICAL DATA:  Stroke, follow up Left-sided pain and weakness onset this morning. EXAM: CT HEAD WITHOUT CONTRAST TECHNIQUE: Contiguous axial images were obtained from the base of the skull through the vertex without intravenous contrast. COMPARISON:  Head CT and brain MRI 08/02/2019 FINDINGS: Brain: Stable degree of atrophy and chronic small vessel ischemia. No intracranial hemorrhage, mass effect, or midline shift. No  hydrocephalus. The basilar cisterns are patent. No evidence of territorial infarct or acute ischemia. No extra-axial or intracranial fluid collection. Vascular: Atherosclerosis of skullbase vasculature without hyperdense vessel or abnormal calcification. Skull: No fracture or focal lesion. Sinuses/Orbits: Opacification of a single right mastoid air cell is new from prior exam. Mastoid air cells are otherwise clear. Paranasal sinuses are clear. Orbits are unremarkable. Other: None. IMPRESSION: 1. No acute intracranial abnormality. No hemorrhage or evidence of acute  ischemia. 2. Stable atrophy and chronic small vessel ischemia. Electronically Signed   By: Keith Rake M.D.   On: 09/24/2019 21:54    EKG: I independently viewed the EKG done and my findings are as followed: No EKG was done in the ED  Assessment/Plan Present on Admission:  Paresthesia of skin  HLD (hyperlipidemia)  Type 2 diabetes with nephropathy (HCC)  Hyperkalemia  Hypocalcemia  Left sided numbness  Active Problems:   Type 2 diabetes with nephropathy (HCC)   HLD (hyperlipidemia)   S/P below knee amputation, right (HCC)   Hyperkalemia   ESRD on hemodialysis (HCC)   Left sided numbness   Hypocalcemia   Paresthesia of skin  Left-sided numbness with pain/paresthesia of skin CT head without contrast showed no acute intracranial abnormality. No hemorrhage or evidence of acute ischemia. Teleneurology was consulted and recommended MRI of cervical/lumbar vertebra with no need for repeat full stroke work-up which was recently done and was negative Continue fall precaution and recheck Continue PT/OT eval and treat Consider neurology consult based on MRI findings.  Hypocalcemia Ca 7.5> 7.7 Continue Tums, Os-Cal  Macrocytic anemia B12 and folate level will be checked  ESRD on hemodialysis Last hemodialysis was 09/24/19 Nephrology will be consulted for maintenance HD  Type 2 diabetes mellitus Continue insulin  sliding scale and hypoglycemia protocol  Essential hypertension Continue Coreg  Atrial fibrillation CHA2DS2-VASc score is 2 which is equal to 2.2 % annual risk of stroke HAS-BLED score = 2 Continue Eliquis and Coreg   DVT prophylaxis: Eliquis  Code Status: Full code  Family Communication: None at bedside  Disposition Plan:  Patient is from:                        home Anticipated DC to:                   SNF or family members home Anticipated DC date:               2-3 days Anticipated DC barriers:          Patient has neurological symptoms that require further work-up  Consults called: Neurolgy   Admission status: Observation    Bernadette Hoit MD [facility]  If 7PM-7AM, please contact night-coverage [email or web address] Password [facility]  09/25/2019, 5:33 AM

## 2019-09-25 ENCOUNTER — Observation Stay (HOSPITAL_COMMUNITY): Payer: Medicaid Other

## 2019-09-25 ENCOUNTER — Encounter (HOSPITAL_COMMUNITY): Payer: Self-pay | Admitting: Internal Medicine

## 2019-09-25 DIAGNOSIS — D631 Anemia in chronic kidney disease: Secondary | ICD-10-CM | POA: Diagnosis present

## 2019-09-25 DIAGNOSIS — E785 Hyperlipidemia, unspecified: Secondary | ICD-10-CM | POA: Diagnosis present

## 2019-09-25 DIAGNOSIS — N186 End stage renal disease: Secondary | ICD-10-CM | POA: Diagnosis present

## 2019-09-25 DIAGNOSIS — F4310 Post-traumatic stress disorder, unspecified: Secondary | ICD-10-CM | POA: Diagnosis present

## 2019-09-25 DIAGNOSIS — E1122 Type 2 diabetes mellitus with diabetic chronic kidney disease: Secondary | ICD-10-CM | POA: Diagnosis present

## 2019-09-25 DIAGNOSIS — Z7982 Long term (current) use of aspirin: Secondary | ICD-10-CM | POA: Diagnosis not present

## 2019-09-25 DIAGNOSIS — D539 Nutritional anemia, unspecified: Secondary | ICD-10-CM

## 2019-09-25 DIAGNOSIS — E1142 Type 2 diabetes mellitus with diabetic polyneuropathy: Secondary | ICD-10-CM | POA: Diagnosis present

## 2019-09-25 DIAGNOSIS — Z20822 Contact with and (suspected) exposure to covid-19: Secondary | ICD-10-CM | POA: Diagnosis present

## 2019-09-25 DIAGNOSIS — I5042 Chronic combined systolic (congestive) and diastolic (congestive) heart failure: Secondary | ICD-10-CM | POA: Diagnosis present

## 2019-09-25 DIAGNOSIS — Z833 Family history of diabetes mellitus: Secondary | ICD-10-CM | POA: Diagnosis not present

## 2019-09-25 DIAGNOSIS — Z7901 Long term (current) use of anticoagulants: Secondary | ICD-10-CM | POA: Diagnosis not present

## 2019-09-25 DIAGNOSIS — Z79899 Other long term (current) drug therapy: Secondary | ICD-10-CM | POA: Diagnosis not present

## 2019-09-25 DIAGNOSIS — Z8249 Family history of ischemic heart disease and other diseases of the circulatory system: Secondary | ICD-10-CM | POA: Diagnosis not present

## 2019-09-25 DIAGNOSIS — Z888 Allergy status to other drugs, medicaments and biological substances status: Secondary | ICD-10-CM | POA: Diagnosis not present

## 2019-09-25 DIAGNOSIS — Z89511 Acquired absence of right leg below knee: Secondary | ICD-10-CM | POA: Diagnosis not present

## 2019-09-25 DIAGNOSIS — R200 Anesthesia of skin: Secondary | ICD-10-CM | POA: Diagnosis not present

## 2019-09-25 DIAGNOSIS — M50322 Other cervical disc degeneration at C5-C6 level: Secondary | ICD-10-CM | POA: Diagnosis present

## 2019-09-25 DIAGNOSIS — F17210 Nicotine dependence, cigarettes, uncomplicated: Secondary | ICD-10-CM | POA: Diagnosis present

## 2019-09-25 DIAGNOSIS — R531 Weakness: Secondary | ICD-10-CM | POA: Diagnosis not present

## 2019-09-25 DIAGNOSIS — I4891 Unspecified atrial fibrillation: Secondary | ICD-10-CM | POA: Diagnosis present

## 2019-09-25 DIAGNOSIS — E875 Hyperkalemia: Secondary | ICD-10-CM | POA: Diagnosis present

## 2019-09-25 DIAGNOSIS — I132 Hypertensive heart and chronic kidney disease with heart failure and with stage 5 chronic kidney disease, or end stage renal disease: Secondary | ICD-10-CM | POA: Diagnosis present

## 2019-09-25 DIAGNOSIS — Z992 Dependence on renal dialysis: Secondary | ICD-10-CM | POA: Diagnosis not present

## 2019-09-25 DIAGNOSIS — Z801 Family history of malignant neoplasm of trachea, bronchus and lung: Secondary | ICD-10-CM | POA: Diagnosis not present

## 2019-09-25 DIAGNOSIS — M898X9 Other specified disorders of bone, unspecified site: Secondary | ICD-10-CM | POA: Diagnosis present

## 2019-09-25 LAB — COMPREHENSIVE METABOLIC PANEL
ALT: 17 U/L (ref 0–44)
AST: 16 U/L (ref 15–41)
Albumin: 3.4 g/dL — ABNORMAL LOW (ref 3.5–5.0)
Alkaline Phosphatase: 113 U/L (ref 38–126)
Anion gap: 12 (ref 5–15)
BUN: 45 mg/dL — ABNORMAL HIGH (ref 6–20)
CO2: 28 mmol/L (ref 22–32)
Calcium: 7.7 mg/dL — ABNORMAL LOW (ref 8.9–10.3)
Chloride: 97 mmol/L — ABNORMAL LOW (ref 98–111)
Creatinine, Ser: 8.52 mg/dL — ABNORMAL HIGH (ref 0.61–1.24)
GFR calc Af Amer: 8 mL/min — ABNORMAL LOW (ref >60)
GFR calc non Af Amer: 7 mL/min — ABNORMAL LOW (ref >60)
Glucose, Bld: 118 mg/dL — ABNORMAL HIGH (ref 70–99)
Potassium: 4.7 mmol/L (ref 3.5–5.1)
Sodium: 137 mmol/L (ref 135–145)
Total Bilirubin: 0.5 mg/dL (ref 0.3–1.2)
Total Protein: 6.7 g/dL (ref 6.5–8.1)

## 2019-09-25 LAB — CBC
HCT: 35.8 % — ABNORMAL LOW (ref 39.0–52.0)
Hemoglobin: 11.2 g/dL — ABNORMAL LOW (ref 13.0–17.0)
MCH: 33 pg (ref 26.0–34.0)
MCHC: 31.3 g/dL (ref 30.0–36.0)
MCV: 105.6 fL — ABNORMAL HIGH (ref 80.0–100.0)
Platelets: 223 10*3/uL (ref 150–400)
RBC: 3.39 MIL/uL — ABNORMAL LOW (ref 4.22–5.81)
RDW: 13.3 % (ref 11.5–15.5)
WBC: 7.3 10*3/uL (ref 4.0–10.5)
nRBC: 0 % (ref 0.0–0.2)

## 2019-09-25 LAB — GLUCOSE, CAPILLARY
Glucose-Capillary: 85 mg/dL (ref 70–99)
Glucose-Capillary: 95 mg/dL (ref 70–99)
Glucose-Capillary: 122 mg/dL — ABNORMAL HIGH (ref 70–99)

## 2019-09-25 LAB — PHOSPHORUS: Phosphorus: 5.7 mg/dL — ABNORMAL HIGH (ref 2.5–4.6)

## 2019-09-25 LAB — MAGNESIUM: Magnesium: 2 mg/dL (ref 1.7–2.4)

## 2019-09-25 LAB — PROTIME-INR
INR: 1.1 (ref 0.8–1.2)
Prothrombin Time: 14.1 seconds (ref 11.4–15.2)

## 2019-09-25 LAB — VITAMIN B12: Vitamin B-12: 444 pg/mL (ref 180–914)

## 2019-09-25 LAB — APTT: aPTT: 44 seconds — ABNORMAL HIGH (ref 24–36)

## 2019-09-25 LAB — FOLATE: Folate: 10.7 ng/mL (ref >5.9)

## 2019-09-25 IMAGING — MR MR CERVICAL SPINE W/O CM
5 series · 36 of 48 positions shown · non-contrast
Comparison: None.

CLINICAL DATA: Left-sided numbness or tingling.

EXAM:
MRI CERVICAL SPINE WITHOUT CONTRAST
TECHNIQUE: Multiplanar, multisequence MR imaging of the cervical spine was
performed. No intravenous contrast was administered.

[Series 16: csp t2_tse_sag_fast · sagittal · 3.0mm · 0.43mm/px · 6 of 13 slices shown]
[im 1/13]
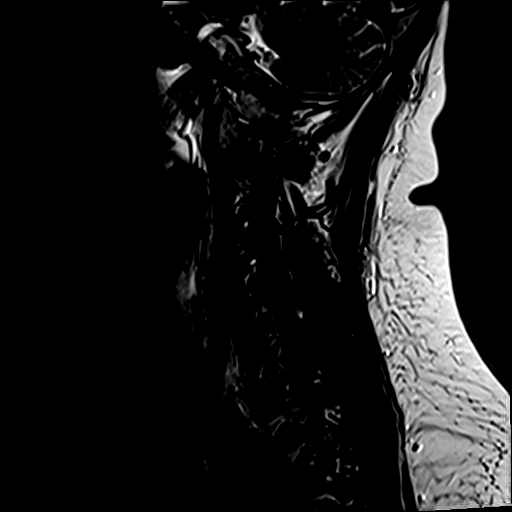
[im 3/13]
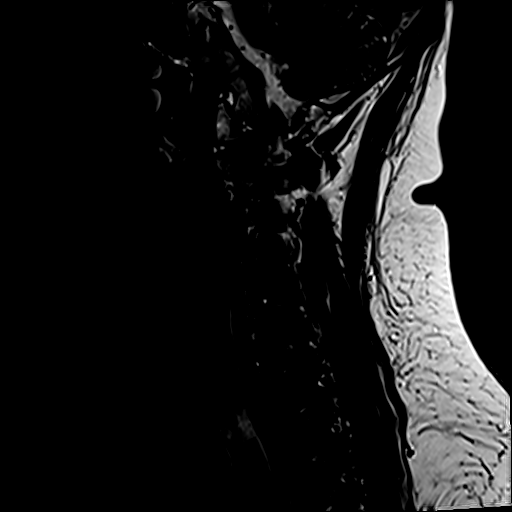
[im 5/13]
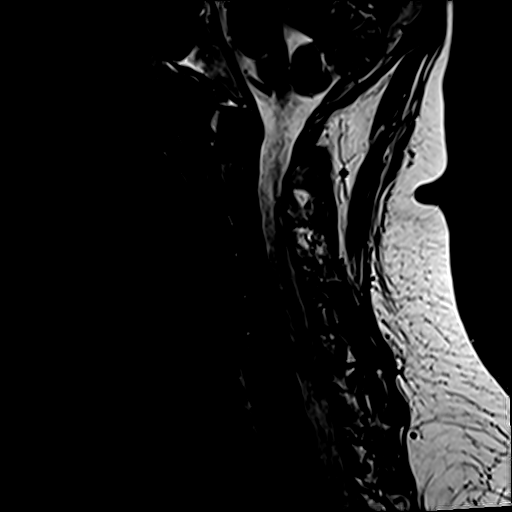
[im 8/13]
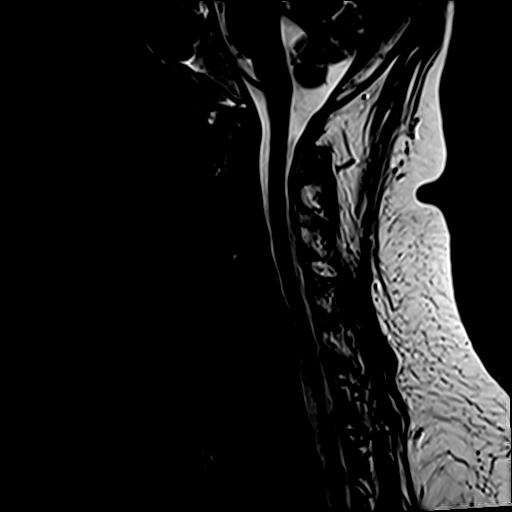
[im 10/13]
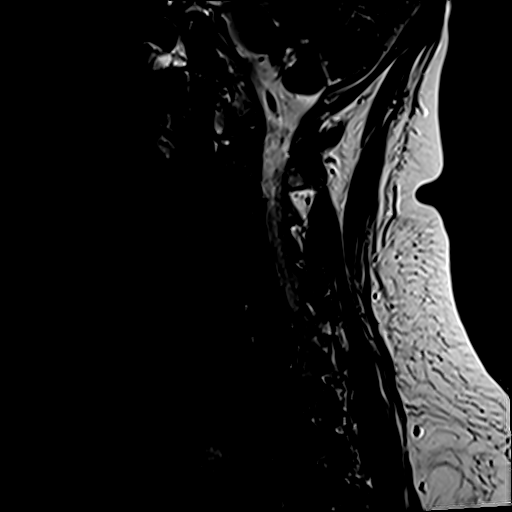
[im 13/13]
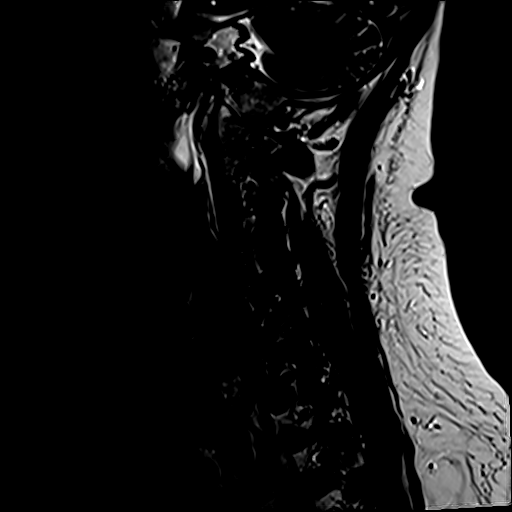

[Series 17: csp t1_tse_sag_fast · sagittal · 3.0mm · 0.43mm/px · 6 of 13 slices shown]
[im 1/13]
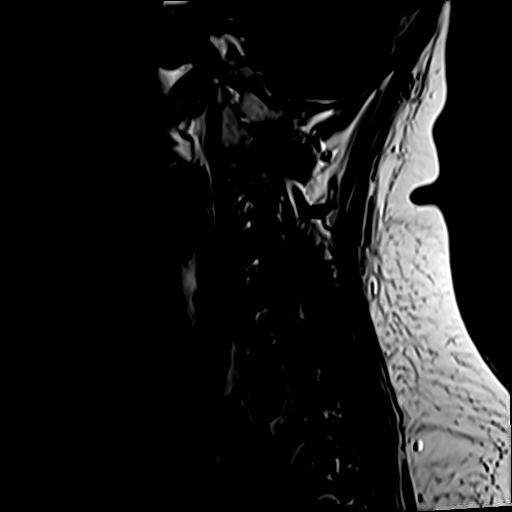
[im 3/13]
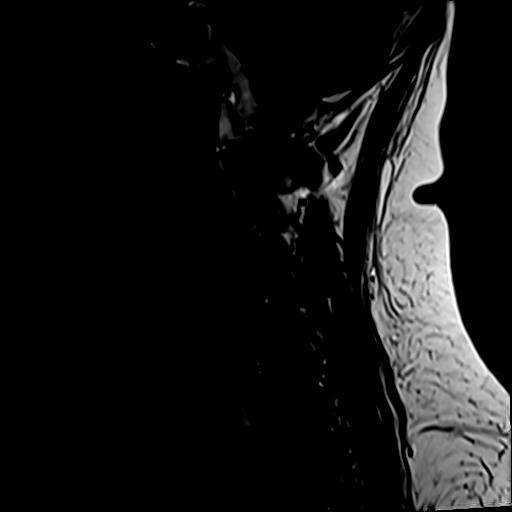
[im 5/13]
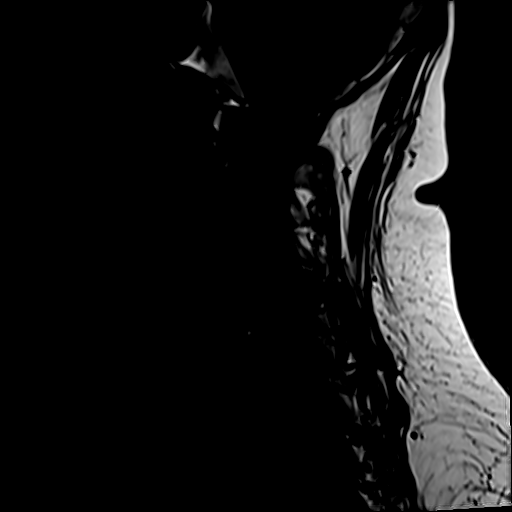
[im 8/13]
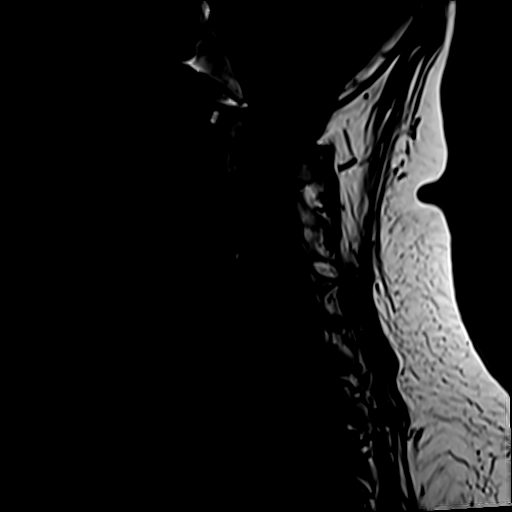
[im 10/13]
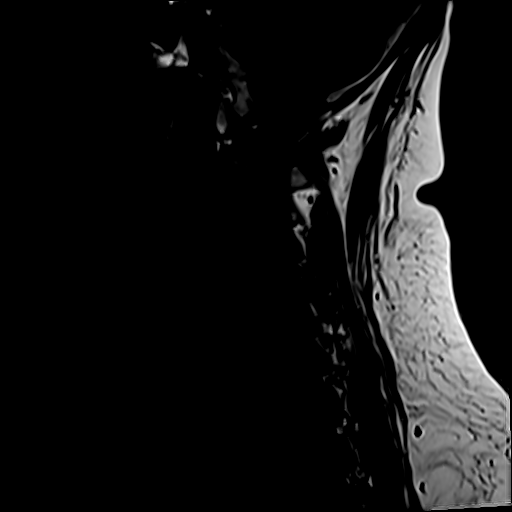
[im 13/13]
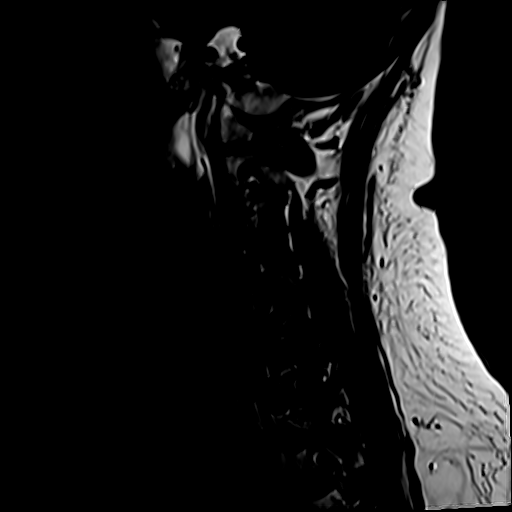

[Series 18: STIR · sagittal · 3.0mm · 0.86mm/px · 6 of 13 slices shown]
[im 1/13]
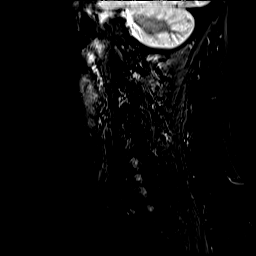
[im 3/13]
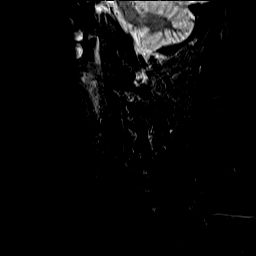
[im 5/13]
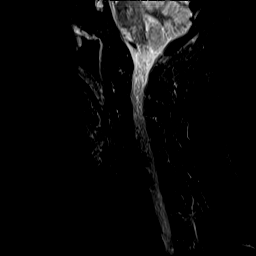
[im 8/13]
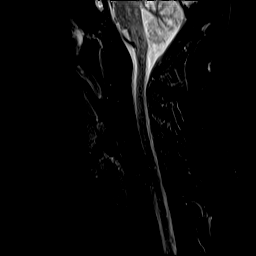
[im 10/13]
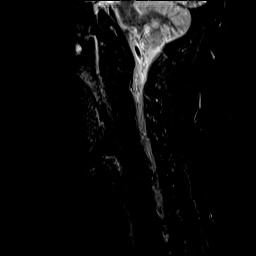
[im 13/13]
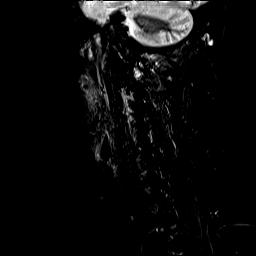

[Series 19: csp t2_tse_tra_fast · axial · 3.0mm · 0.78mm/px · z∈[-134,-37]mm · 10 of 32 slices shown]
[im 1/32]
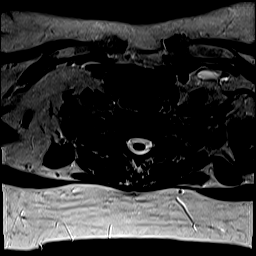
[im 3/32]
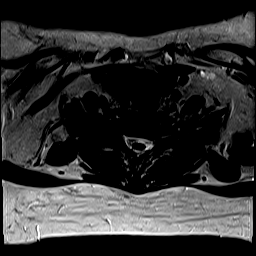
[im 5/32]
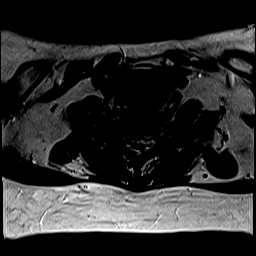
[im 9/32]
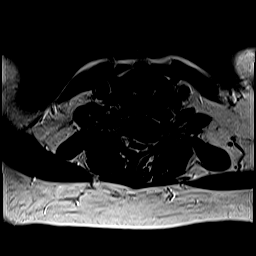
[im 14/32]
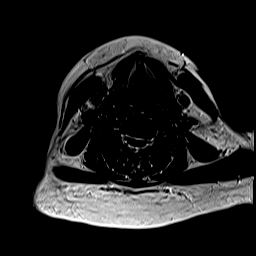
[im 16/32]
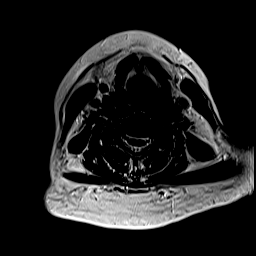
[im 18/32]
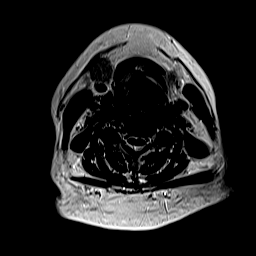
[im 23/32]
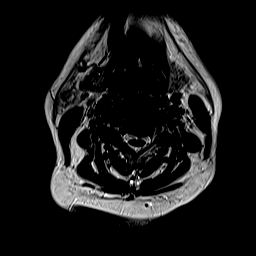
[im 27/32]
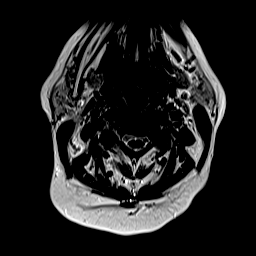
[im 32/32]
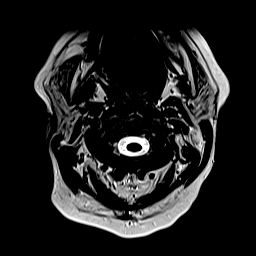

[Series 20: GRE · axial · 3.0mm · 0.78mm/px · z∈[-134,-37]mm · 8 of 32 slices shown]
[im 1/32]
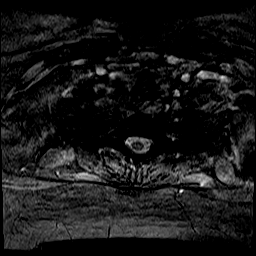
[im 5/32]
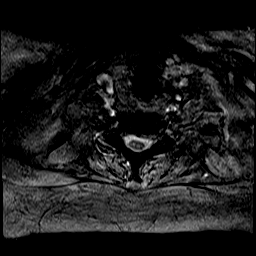
[im 9/32]
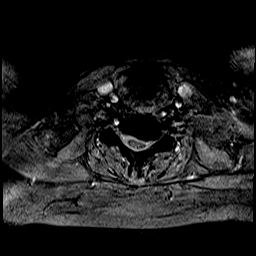
[im 14/32]
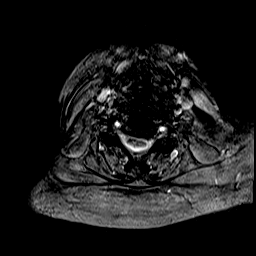
[im 18/32]
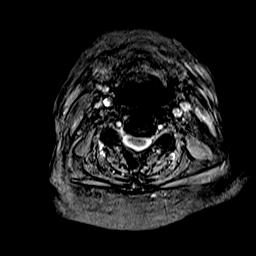
[im 23/32]
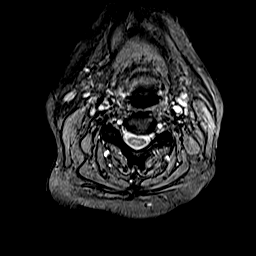
[im 27/32]
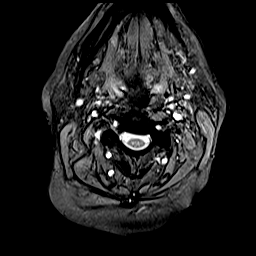
[im 32/32]
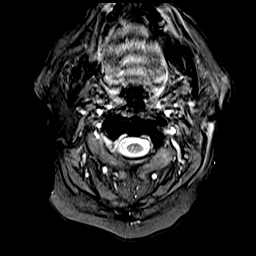

[36 of 48 positions shown; findings below may reference images not displayed]

FINDINGS: Alignment: Straightening of the cervical spine.

Vertebrae: No fracture, evidence of discitis, or bone lesion.

Cord: Normal signal and morphology.

Posterior Fossa, vertebral arteries, paraspinal tissues: Negative.

Disc levels:

C2-3: Unremarkable.

C3-4: Unremarkable.

C4-5: Unremarkable.

C5-6: Disc narrowing with left paracentral shallow protrusion.
Negative facets. No neural compression

C6-7: Greatest level of degenerative disc narrowing. Uncovertebral
spurring and left-sided disc osteophyte complex causing high-grade
foraminal impingement and mild left cord flattening. Negative
facets.

C7-T1:Unremarkable.
IMPRESSION: C5-6 and C6-7 disc degeneration, more notable at C6-7 where there is
prominent left uncovertebral and paracentral spurring causing
foraminal impingement and mild left cord mass effect. No other level
of left foraminal impingement.

## 2019-09-25 IMAGING — MR MR HEAD W/O CM
13 series · 48 of 48 positions shown · non-contrast
Comparison: [DATE]

CLINICAL DATA: Left-sided numbness

EXAM:
MRI HEAD WITHOUT CONTRAST
TECHNIQUE: Multiplanar, multiecho pulse sequences of the brain and surrounding
structures were obtained without intravenous contrast.

[Series 5: DWI · axial · 3.0mm · 0.88mm/px · z∈[-11,+132]mm · 4 of 50 slices shown (1 of 6)]
[im 1/50]
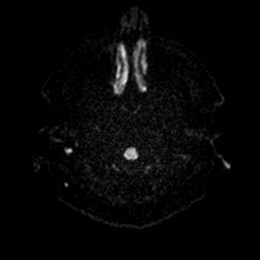
[im 17/50]
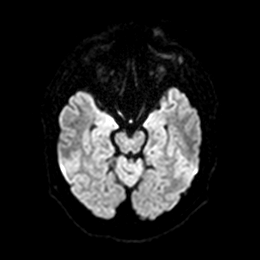
[im 33/50]
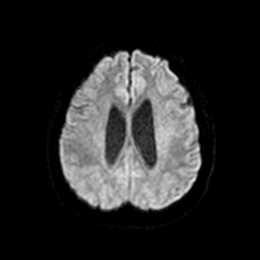
[im 50/50]
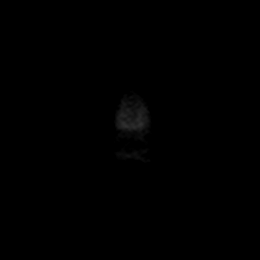

[Series 5: DWI · axial · 3.0mm · 0.88mm/px · z∈[-11,+132]mm · 3 of 50 slices shown (2 of 6)]
[im 1/50]
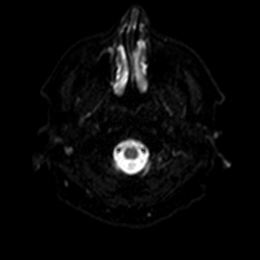
[im 25/50]
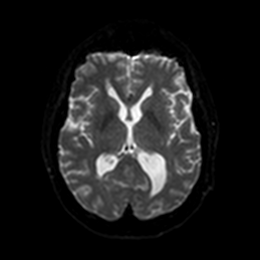
[im 50/50]
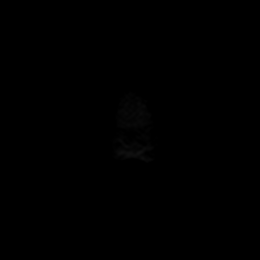

[Series 6: DWI · axial · 3.0mm · 0.88mm/px · z∈[-11,+132]mm · 3 of 50 slices shown (3 of 6)]
[im 1/50]
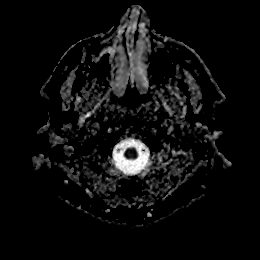
[im 25/50]
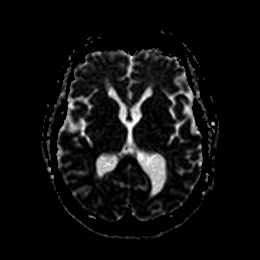
[im 50/50]
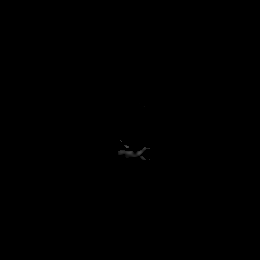

[Series 7: DWI · coronal · 4.0mm · 0.88mm/px · 2 of 32 slices shown (4 of 6)]
[im 1/32]
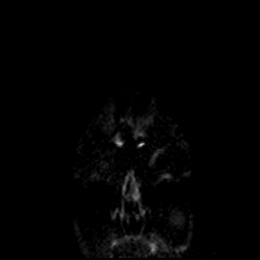
[im 32/32]
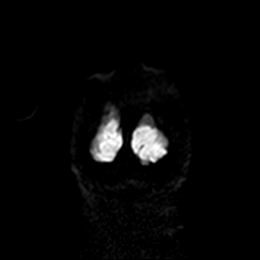

[Series 7: DWI · coronal · 4.0mm · 0.88mm/px · 2 of 32 slices shown (5 of 6)]
[im 1/32]
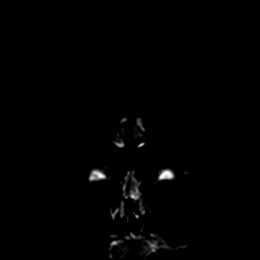
[im 32/32]
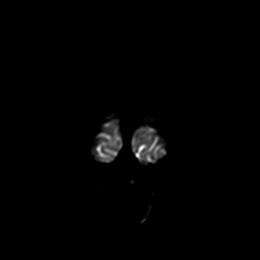

[Series 8: DWI · coronal · 4.0mm · 0.88mm/px · 2 of 32 slices shown (6 of 6)]
[im 1/32]
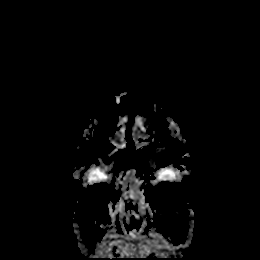
[im 32/32]
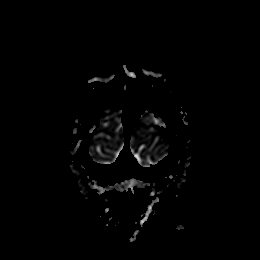

[Series 9: T1 · sagittal · 5.0mm · 0.75mm/px · 2 of 23 slices shown (1 of 2)]
[im 1/23]
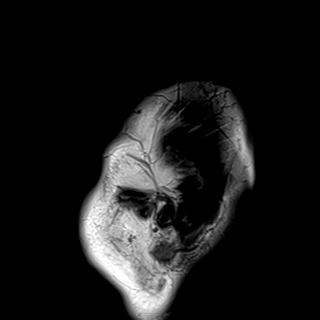
[im 23/23]
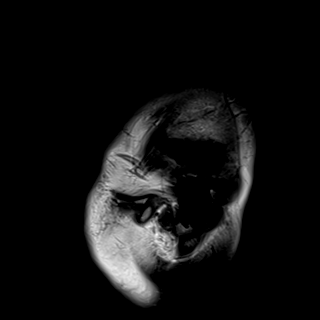

[Series 10: T2 · axial · 5.0mm · 0.72mm/px · z∈[-9,+130]mm · 2 of 25 slices shown]
[im 1/25]
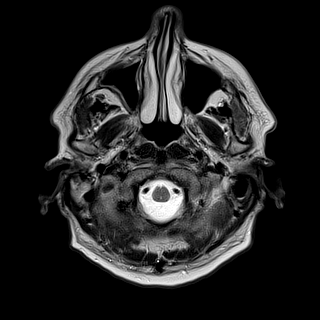
[im 25/25]
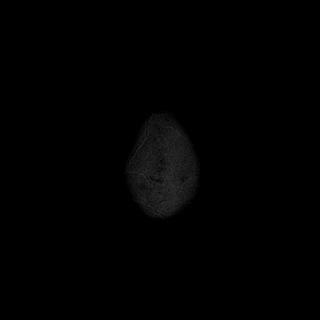

[Series 11: mag_images · axial · 3.0mm · 0.90mm/px · z∈[-22,+138]mm · 4 of 56 slices shown]
[im 1/56]
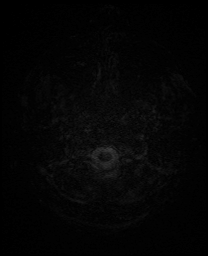
[im 19/56]
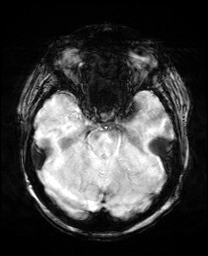
[im 37/56]
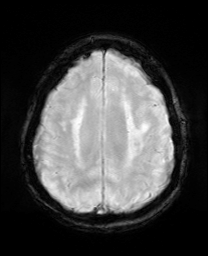
[im 56/56]
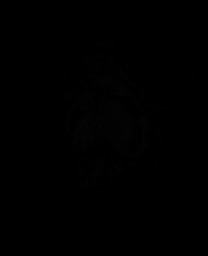

[Series 12: pha_images · axial · 3.0mm · 0.90mm/px · z∈[-19,+138]mm · 4 of 55 slices shown]
[im 1/55]
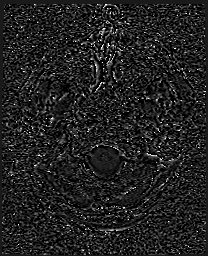
[im 19/55]
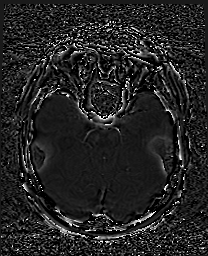
[im 37/55]
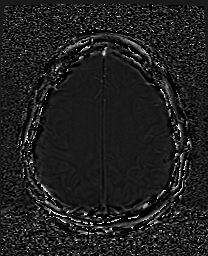
[im 55/55]
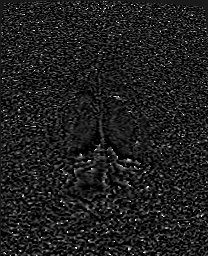

[Series 13: swi_images · axial · 3.0mm · 0.90mm/px · z∈[-22,+138]mm · 4 of 56 slices shown]
[im 1/56]
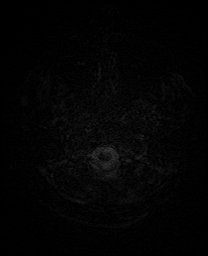
[im 19/56]
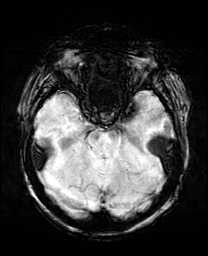
[im 37/56]
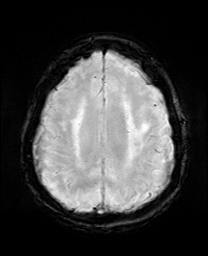
[im 56/56]
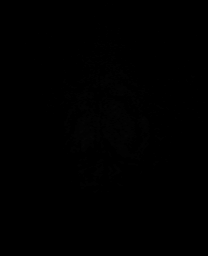

[Series 15: FLAIR · axial · 3.0mm · 0.53mm/px · z∈[-17,+140]mm · 4 of 55 slices shown]
[im 1/55]
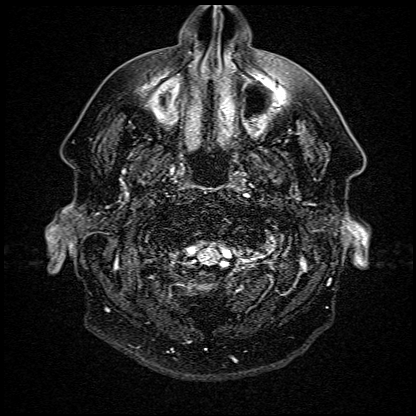
[im 19/55]
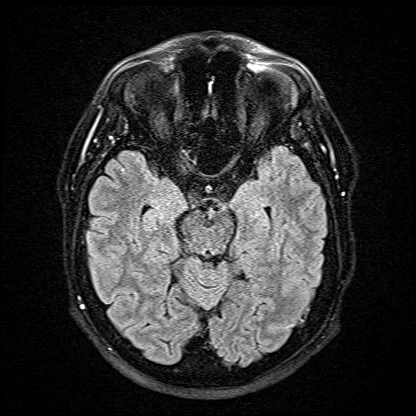
[im 37/55]
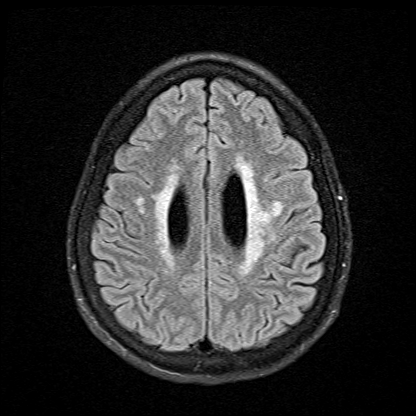
[im 55/55]
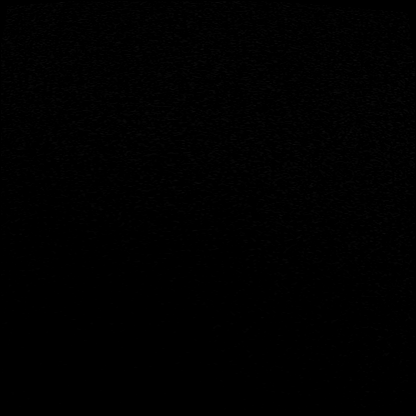

[Series 16: T1 · axial · 1.0mm · 0.98mm/px · z∈[-28,+142]mm · 12 of 173 slices shown (2 of 2)]
[im 1/173]
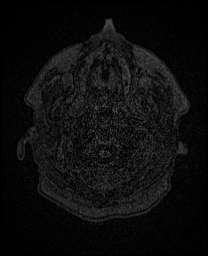
[im 16/173]
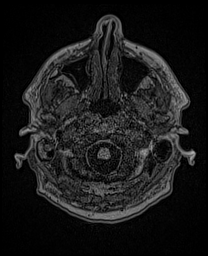
[im 32/173]
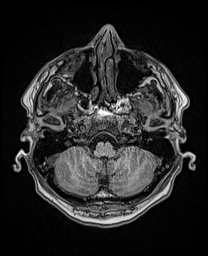
[im 47/173]
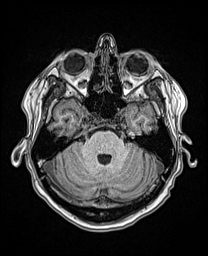
[im 63/173]
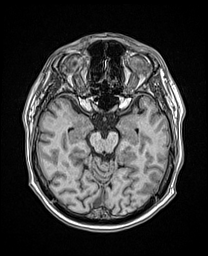
[im 79/173]
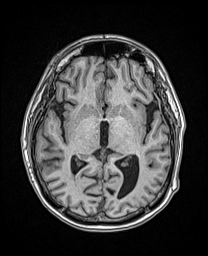
[im 94/173]
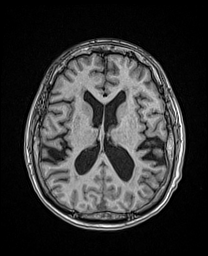
[im 110/173]
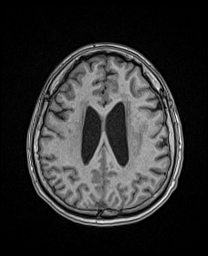
[im 126/173]
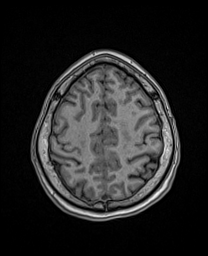
[im 141/173]
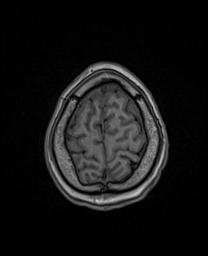
[im 157/173]
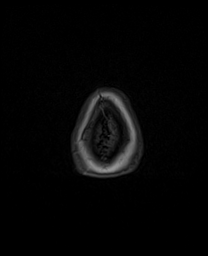
[im 173/173]
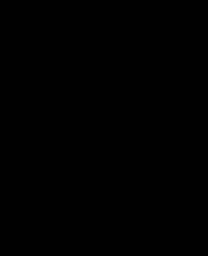

[48 of 48 positions shown; findings below may reference images not displayed]

FINDINGS: Brain: There is no acute infarction or intracranial hemorrhage.
There is no intracranial mass, mass effect, or edema. There is no
hydrocephalus or extra-axial fluid collection. Patchy and confluent
areas of T2 hyperintensity in the supratentorial white matter are
nonspecific but probably reflect moderate chronic microvascular
ischemic changes. There is ex vacuo dilatation of the lateral
ventricles.

Vascular: Major vessel flow voids at the skull base are preserved.

Skull and upper cervical spine: Normal marrow signal is preserved.

Sinuses/Orbits: Paranasal sinuses are aerated. Orbits are
unremarkable.

Other: Sella is unremarkable.  Mastoid air cells are clear.
IMPRESSION: No evidence of recent infarction, hemorrhage, or mass. Moderate
chronic microvascular ischemic changes, greater than expected for
age.

## 2019-09-25 IMAGING — MR MR LUMBAR SPINE W/O CM
5 series · 32 of 48 positions shown · non-contrast
Comparison: None.

CLINICAL DATA: Left-sided numbness and pain. Left leg weakness upon
awakening this morning

EXAM:
MRI LUMBAR SPINE WITHOUT CONTRAST
TECHNIQUE: Multiplanar, multisequence MR imaging of the lumbar spine was
performed. No intravenous contrast was administered.

[Series 1: T2 · sagittal · 4.0mm · 0.88mm/px · 7 of 13 slices shown (1 of 2)]
[im 1/13]
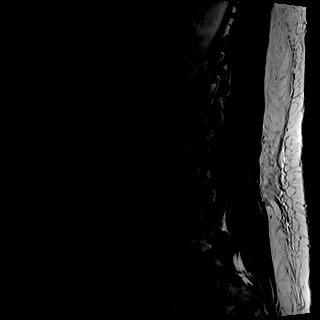
[im 3/13]
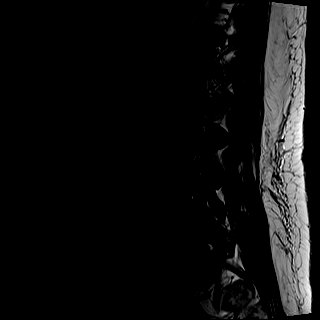
[im 5/13]
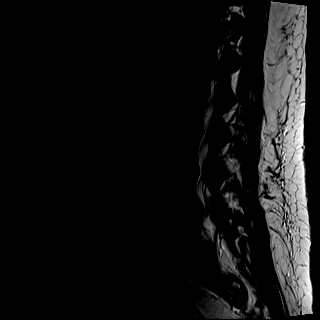
[im 7/13]
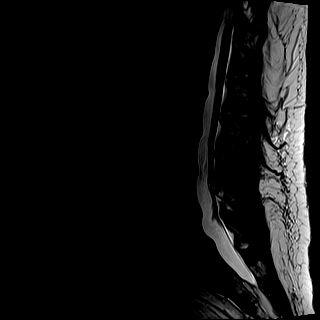
[im 9/13]
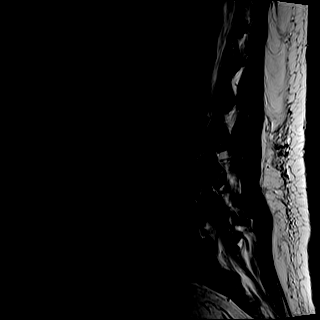
[im 11/13]
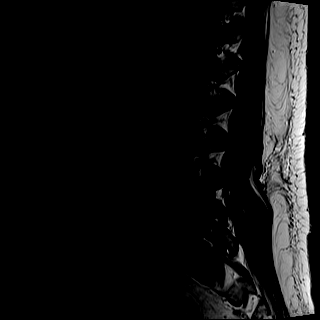
[im 13/13]
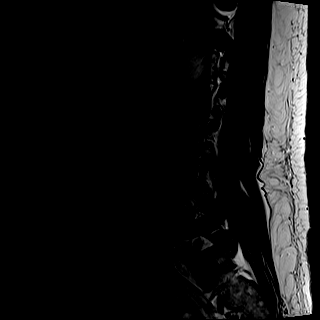

[Series 2: STIR · sagittal · 4.0mm · 0.51mm/px · 3 of 13 slices shown]
[im 1/13]
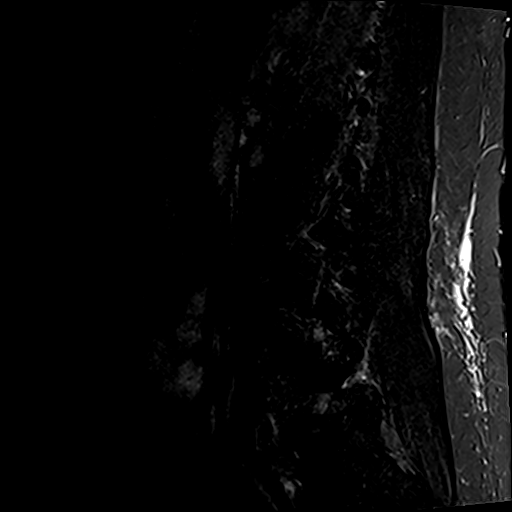
[im 3/13]
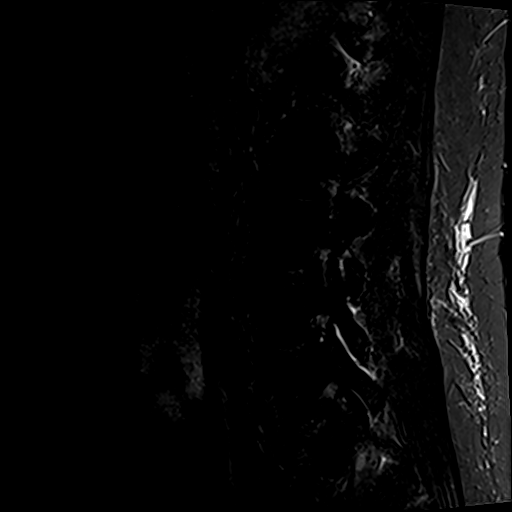
[im 5/13]
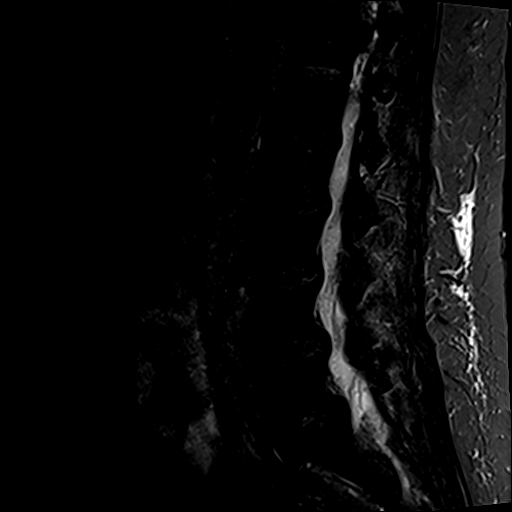

[Series 3: T1 · sagittal · 4.0mm · 1.02mm/px · 6 of 13 slices shown (1 of 2)]
[im 1/13]
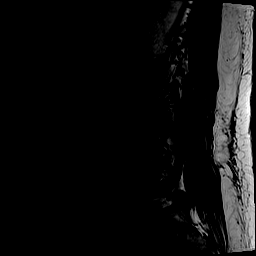
[im 3/13]
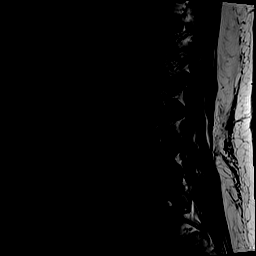
[im 5/13]
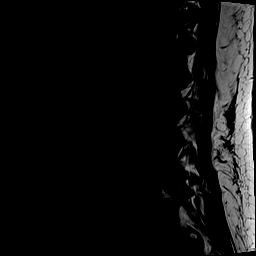
[im 8/13]
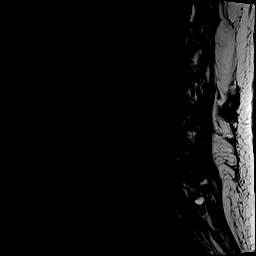
[im 10/13]
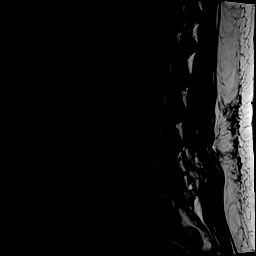
[im 13/13]
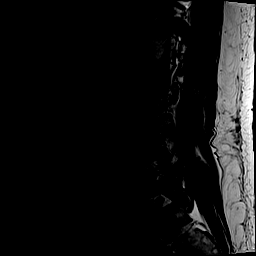

[Series 5: T1 · axial · 4.0mm · 0.35mm/px · z∈[-584,-395]mm · 8 of 29 slices shown (2 of 2)]
[im 1/29]
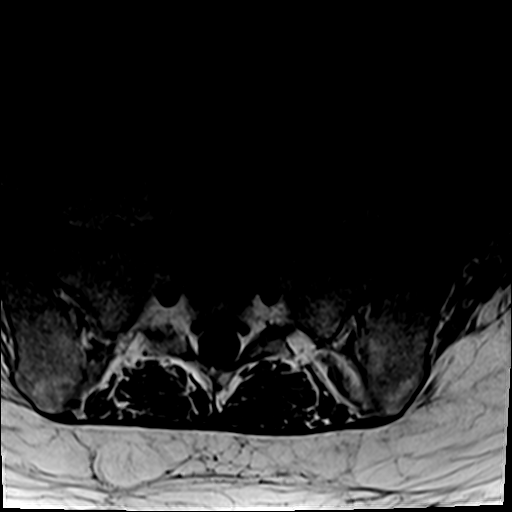
[im 5/29]
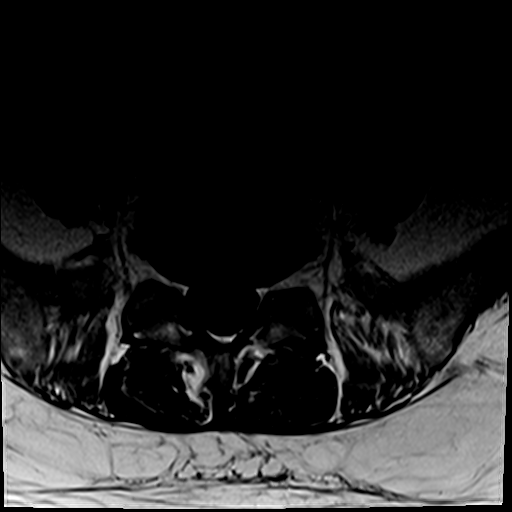
[im 9/29]
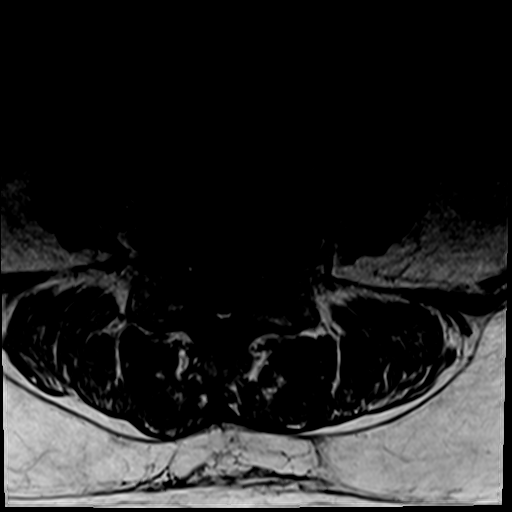
[im 13/29]
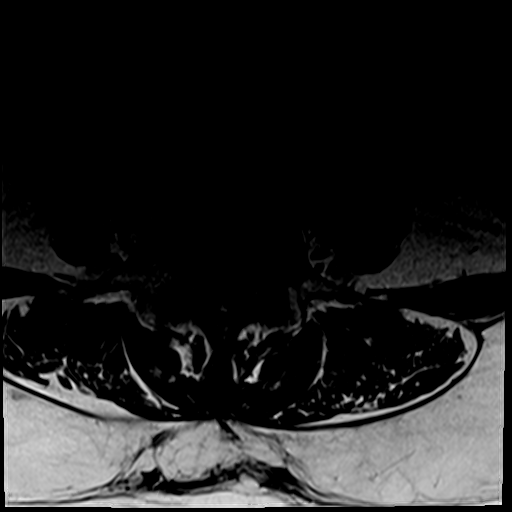
[im 16/29]
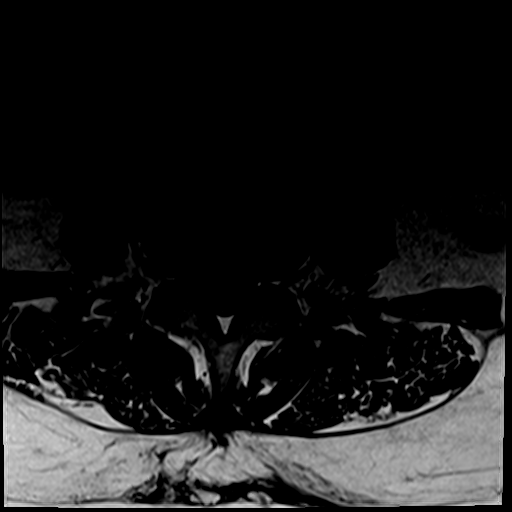
[im 20/29]
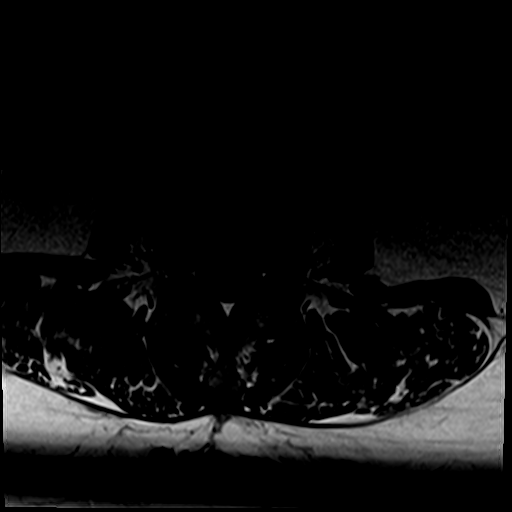
[im 24/29]
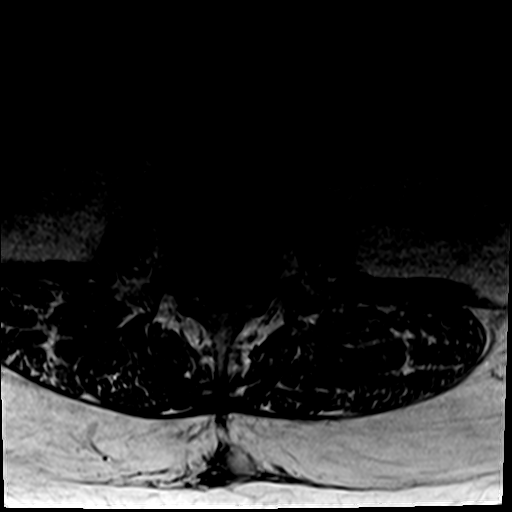
[im 29/29]
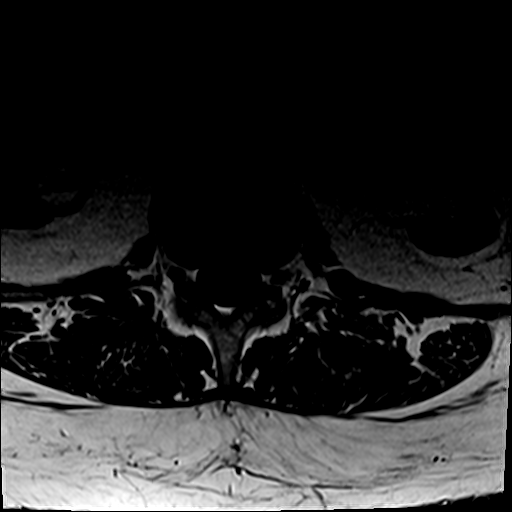

[Series 6: T2 · axial · 4.0mm · 0.70mm/px · z∈[-607,-414]mm · 8 of 29 slices shown (2 of 2)]
[im 1/29]
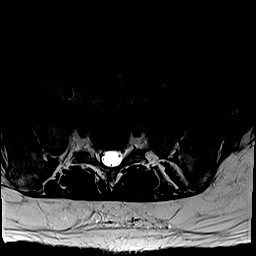
[im 5/29]
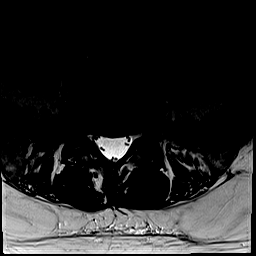
[im 9/29]
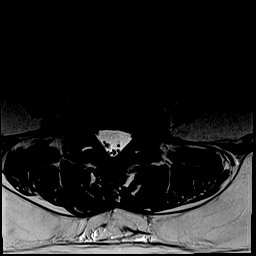
[im 13/29]
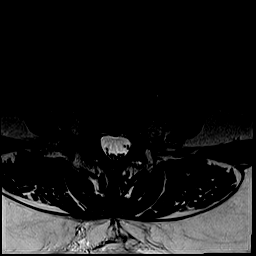
[im 16/29]
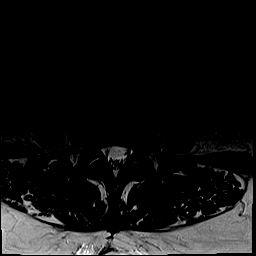
[im 20/29]
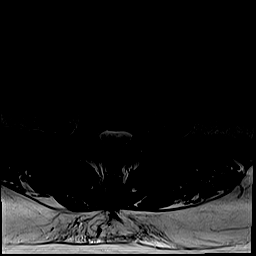
[im 24/29]
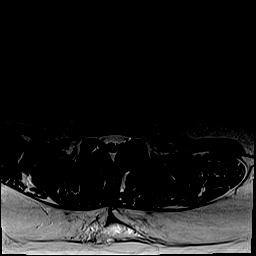
[im 29/29]
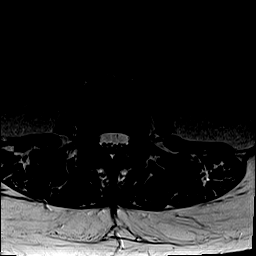

[32 of 48 positions shown; findings below may reference images not displayed]

FINDINGS: Segmentation:  Standard lumbar numbering.

Alignment: Grade 1 anterolisthesis at L5-S1 that is associated with
a right-sided chronic pars defect at L5.

Vertebrae: Schmorl's node at the L2 superior endplate. No acute
fracture, discitis, or aggressive bone lesion.

Conus medullaris and cauda equina: Conus extends to the L1-2 level.
Conus and cauda equina appear normal.

Paraspinal and other soft tissues: Negative

Disc levels:

L1-L2: Mild disc bulging.

L2-L3: Mild disc bulging.

L3-L4: Mild disc bulging.

L4-L5: Disc bulging and left foraminal annular fissure with
protrusion causing foraminal impingement when combined with mild
facet spurring. Disc bulging also narrows the right foramen to a
milder degree. Widely patent canal.

L5-S1:Right-sided L5 pars defect with anterolisthesis. Greatest
level of degenerative disc narrowing with left eccentric far-lateral
spurring. Bilateral foraminal narrowing which is somewhat prominent
on sagittal images but noncompressive based on axial slices. Widely
patent canal.
IMPRESSION: 1. L4-5 left foraminal protrusion with moderate left L4 impingement.
2. L5 chronic right-sided pars defect with L5-S1 anterolisthesis and
disc degeneration. Noncompressive foraminal narrowing at this level
on the left more than right.
3. Diffusely patent spinal canal.

## 2019-09-25 MED ORDER — CALCIUM CARBONATE ANTACID 500 MG PO CHEW
800.0000 mg | CHEWABLE_TABLET | Freq: Two times a day (BID) | ORAL | Status: DC
  Administered 2019-09-25 – 2019-09-27 (×5): 800 mg via ORAL
  Filled 2019-09-25 (×5): qty 4

## 2019-09-25 MED ORDER — RENA-VITE PO TABS
1.0000 | ORAL_TABLET | Freq: Every day | ORAL | Status: DC
  Administered 2019-09-25 – 2019-09-27 (×3): 1 via ORAL
  Filled 2019-09-25 (×6): qty 1

## 2019-09-25 MED ORDER — SODIUM CHLORIDE 0.9 % IV SOLN
500.0000 mg | Freq: Every day | INTRAVENOUS | Status: AC
  Administered 2019-09-26 – 2019-09-27 (×2): 500 mg via INTRAVENOUS
  Filled 2019-09-25 (×2): qty 4

## 2019-09-25 MED ORDER — CALCIUM CARBONATE 1250 (500 CA) MG PO TABS
1.0000 | ORAL_TABLET | Freq: Every day | ORAL | Status: DC
  Administered 2019-09-25 – 2019-09-27 (×2): 500 mg via ORAL
  Filled 2019-09-25 (×6): qty 1

## 2019-09-25 MED ORDER — ATORVASTATIN CALCIUM 10 MG PO TABS
10.0000 mg | ORAL_TABLET | Freq: Every day | ORAL | Status: DC
  Administered 2019-09-25 – 2019-09-26 (×2): 10 mg via ORAL
  Filled 2019-09-25 (×2): qty 1

## 2019-09-25 MED ORDER — CHLORHEXIDINE GLUCONATE CLOTH 2 % EX PADS: 6.0000 | MEDICATED_PAD | Freq: Every day | CUTANEOUS | Status: DC

## 2019-09-25 MED ORDER — INSULIN GLARGINE 100 UNIT/ML ~~LOC~~ SOLN
10.0000 [IU] | Freq: Every day | SUBCUTANEOUS | Status: DC
  Administered 2019-09-25 – 2019-09-26 (×2): 10 [IU] via SUBCUTANEOUS
  Filled 2019-09-25 (×5): qty 0.1

## 2019-09-25 MED ORDER — APIXABAN 5 MG PO TABS
5.0000 mg | ORAL_TABLET | Freq: Two times a day (BID) | ORAL | Status: DC
  Administered 2019-09-25: 5 mg via ORAL
  Filled 2019-09-25: qty 1

## 2019-09-25 MED ORDER — INSULIN ASPART 100 UNIT/ML ~~LOC~~ SOLN
0.0000 [IU] | Freq: Every day | SUBCUTANEOUS | Status: DC
  Administered 2019-09-26: 2 [IU] via SUBCUTANEOUS

## 2019-09-25 MED ORDER — PATIROMER SORBITEX CALCIUM 8.4 G PO PACK
8.4000 g | PACK | ORAL | Status: DC
  Administered 2019-09-25: 8.4 g via ORAL
  Filled 2019-09-25 (×3): qty 1

## 2019-09-25 MED ORDER — DOXERCALCIFEROL 4 MCG/2ML IV SOLN
1.5000 ug | INTRAVENOUS | Status: DC
  Administered 2019-09-26: 1.5 ug via INTRAVENOUS
  Filled 2019-09-25: qty 2

## 2019-09-25 MED ORDER — TRAMADOL HCL 50 MG PO TABS
50.0000 mg | ORAL_TABLET | Freq: Four times a day (QID) | ORAL | Status: AC | PRN
  Administered 2019-09-27: 50 mg via ORAL
  Filled 2019-09-25: qty 1

## 2019-09-25 MED ORDER — FENTANYL CITRATE (PF) 100 MCG/2ML IJ SOLN
12.5000 ug | Freq: Once | INTRAMUSCULAR | Status: AC
  Administered 2019-09-25: 12.5 ug via INTRAVENOUS
  Filled 2019-09-25: qty 2

## 2019-09-25 MED ORDER — CARVEDILOL 3.125 MG PO TABS
3.1250 mg | ORAL_TABLET | Freq: Two times a day (BID) | ORAL | Status: DC
  Administered 2019-09-25 – 2019-09-27 (×4): 3.125 mg via ORAL
  Filled 2019-09-25 (×4): qty 1

## 2019-09-25 MED ORDER — INSULIN ASPART 100 UNIT/ML ~~LOC~~ SOLN
0.0000 [IU] | Freq: Three times a day (TID) | SUBCUTANEOUS | Status: DC
  Administered 2019-09-27 (×2): 3 [IU] via SUBCUTANEOUS

## 2019-09-25 NOTE — Progress Notes (Signed)
PROGRESS NOTE    Brendan Campbell  NUU:[id number] DOB: 06-26-1970 DOA: 09/24/2019 PCP: Monico Blitz, MD    Brief Narrative:  HPI: Brendan Campbell is a 49 y.o. male with medical history significant for hypertension, hyperlipidemia, diabetes mellitus type 2, ESRD on hemodialysis, atrial fibrillation on anticoagulation with Eliquis, right BKA, diabetic neuropathy and hypocalcemia who presents to the  emergency department due to left arm and leg pain and numbness which started on awakening around 6 AM today.  Patient states " I feel like my left arm and leg were dead" and he complained of pain that starts from left leg and shoots upward to his left arm.  Patient states that he had a similar presentation in June during which stroke work-up was done and had negative MRI, echocardiogram and carotid ultrasound.  It was then thought to be due to low calcium level.  Patient states that he does have occasional sensation of numbness in extremities including right arm (though usually worse on the left) and this improves transitorily when he takes Tums, he started taking over-the-counter calcium pills which appeared to help as well but improvement was only short-term.  He denies fever, chills, nausea, vomiting, abdominal pain.  ED Course:  In the emergency department, he was hemodynamically stable.  Work-up in ED showed macrocytic anemia, hyperkalemia, BSL creatinine 41/8.15.  SARS coronavirus 2 was negative.  CT head without contrast showed no acute intracranial abnormality. No hemorrhage or evidence of acute ischemia. Teleneurologist was consulted and recommended MRI of cervical and lumbar spine.  Hospitalist was asked to admit patient for further evaluation and management.   Assessment & Plan:   Active Problems:   Type 2 diabetes with nephropathy (HCC)   HLD (hyperlipidemia)   S/P below knee amputation, right (HCC)   Hyperkalemia   ESRD on hemodialysis (HCC)   Left sided numbness   Hypocalcemia    Paresthesia of skin   Macrocytic anemia   1. Left-sided weakness and numbness, possibly related to cervical myelopathy.  Discussed case with neurosurgery, Dr. Ronnald Ramp and it was felt that patient's clinical findings would not be explained simply by findings on MRI C-spine.  The degree of cord compression needed to cause upper and lower extremity weakness would be more than what is found on patient's MRI.  Recommendations were to look for other causes of patient's symptoms.  Will check MRI brain and consult neurology. 2. Hypocalcemia.  Continue oral supplementation. 3. End-stage renal disease on hemodialysis.  Last dialysis session was on 8/16.  Nephrology consulted.  No emergent indications for dialysis at this time. 4. Diabetes.  Chronically on Lantus 10 units at bedtime.  Continue sliding scale insulin.   5. Atrial fibrillation.  Currently on Coreg and apixaban 6. Hyperlipidemia.  Continue statin. 7. Macrocytic anemia.  Folate and B12 in normal range.  Continue to follow.   DVT prophylaxis: SCDs Start: 09/24/19 2321 apixaban (ELIQUIS) tablet 5 mg  Code Status: full code Family Communication: discussed with patient Disposition Plan: Status is: Observation  The patient remains OBS appropriate and will d/c before 2 midnights.  Dispo: The patient is from: Home              Anticipated d/c is to: TBD              Anticipated d/c date is: 1 day              Patient currently is not medically stable to d/c.  Consultants:   Nephrology  Neurosurgery (phone)   Procedures:     Antimicrobials:       Subjective: Continues to have some numbness and weakness on left side (upper and lower extremity)  Objective: Vitals:   09/25/19 0200 09/25/19 0300 09/25/19 0650 09/25/19 0830  BP: (!) 149/91 116/72 119/80 120/69  Pulse:  77 80 82  Resp: 16 15 18 18   Temp:   97.8 F (36.6 C)   TempSrc:   Oral   SpO2:  95% 99% 96%  Weight:      Height:       No intake or output  data in the 24 hours ending 09/25/19 1000 Filed Weights   09/24/19 2008  Weight: 98.4 kg    Examination:  General exam: Appears calm and comfortable  Respiratory system: Clear to auscultation. Respiratory effort normal. Cardiovascular system: S1 & S2 heard, RRR. No JVD, murmurs, rubs, gallops or clicks. No pedal edema. Gastrointestinal system: Abdomen is nondistended, soft and nontender. No organomegaly or masses felt. Normal bowel sounds heard. Central nervous system: Alert and oriented. Decreased grip strength on the left side, 4/5 strength in left upper and lower extremities Extremities: right bka Skin: No rashes, lesions or ulcers Psychiatry: Judgement and insight appear normal. Mood & affect appropriate.     Data Reviewed: I have personally reviewed following labs and imaging studies  CBC: Recent Labs  Lab 09/24/19 2043 09/25/19 0331  WBC 7.1 7.3  NEUTROABS 4.4  --   HGB 11.4* 11.2*  HCT 36.4* 35.8*  MCV 106.7* 105.6*  PLT 228 782   Basic Metabolic Panel: Recent Labs  Lab 09/24/19 2043 09/25/19 0331  NA 137 137  K 5.4* 4.7  CL 93* 97*  CO2 30 28  GLUCOSE 123* 118*  BUN 41* 45*  CREATININE 8.15* 8.52*  CALCIUM 7.5* 7.7*  MG  --  2.0  PHOS  --  5.7*   GFR: Estimated Creatinine Clearance: 12.9 mL/min (A) (by C-G formula based on SCr of 8.52 mg/dL (H)). Liver Function Tests: Recent Labs  Lab 09/24/19 2043 09/25/19 0331  AST 18 16  ALT 17 17  ALKPHOS 115 113  BILITOT 0.4 0.5  PROT 7.7 6.7  ALBUMIN 3.8 3.4*   No results for input(s): LIPASE, AMYLASE in the last 168 hours. No results for input(s): AMMONIA in the last 168 hours. Coagulation Profile: Recent Labs  Lab 09/24/19 2043 09/25/19 0331  INR 1.1 1.1   Cardiac Enzymes: No results for input(s): CKTOTAL, CKMB, CKMBINDEX, TROPONINI in the last 168 hours. BNP (last 3 results) No results for input(s): PROBNP in the last 8760 hours. HbA1C: No results for input(s): HGBA1C in the last 72  hours. CBG: No results for input(s): GLUCAP in the last 168 hours. Lipid Profile: No results for input(s): CHOL, HDL, LDLCALC, TRIG, CHOLHDL, LDLDIRECT in the last 72 hours. Thyroid Function Tests: No results for input(s): TSH, T4TOTAL, FREET4, T3FREE, THYROIDAB in the last 72 hours. Anemia Panel: Recent Labs    09/25/19 0331  VITAMINB12 444  FOLATE 10.7   Sepsis Labs: No results for input(s): PROCALCITON, LATICACIDVEN in the last 168 hours.  Recent Results (from the past 240 hour(s))  SARS Coronavirus 2 by RT PCR (hospital order, performed in [facility] hospital lab) Nasopharyngeal Nasopharyngeal Swab     Status: None   Collection Time: 09/24/19  9:30 PM   Specimen: Nasopharyngeal Swab  Result Value Ref Range Status   SARS Coronavirus 2 NEGATIVE NEGATIVE Final    Comment: (  NOTE) SARS-CoV-2 target nucleic acids are NOT DETECTED.  The SARS-CoV-2 RNA is generally detectable in upper and lower respiratory specimens during the acute phase of infection. The lowest concentration of SARS-CoV-2 viral copies this assay can detect is 250 copies / mL. A negative result does not preclude SARS-CoV-2 infection and should not be used as the sole basis for treatment or other patient management decisions.  A negative result may occur with improper specimen collection / handling, submission of specimen other than nasopharyngeal swab, presence of viral mutation(s) within the areas targeted by this assay, and inadequate number of viral copies (<250 copies / mL). A negative result must be combined with clinical observations, patient history, and epidemiological information.  Fact Sheet for Patients:   StrictlyIdeas.no  Fact Sheet for Healthcare Providers: BankingDealers.co.za  This test is not yet approved or  cleared by the [place] FDA and has been authorized for detection and/or diagnosis of SARS-CoV-2 by FDA under an Emergency Use  Authorization (EUA).  This EUA will remain in effect (meaning this test can be used) for the duration of the COVID-19 declaration under Section 564(b)(1) of the Act, 21 U.S.C. section 360bbb-3(b)(1), unless the authorization is terminated or revoked sooner.  Performed at [facility], [address], [place], [place] [address]          Radiology Studies: CT HEAD WO CONTRAST  Result Date: 09/24/2019 CLINICAL DATA:  Stroke, follow up Left-sided pain and weakness onset this morning. EXAM: CT HEAD WITHOUT CONTRAST TECHNIQUE: Contiguous axial images were obtained from the base of the skull through the vertex without intravenous contrast. COMPARISON:  Head CT and brain MRI 08/02/2019 FINDINGS: Brain: Stable degree of atrophy and chronic small vessel ischemia. No intracranial hemorrhage, mass effect, or midline shift. No hydrocephalus. The basilar cisterns are patent. No evidence of territorial infarct or acute ischemia. No extra-axial or intracranial fluid collection. Vascular: Atherosclerosis of skullbase vasculature without hyperdense vessel or abnormal calcification. Skull: No fracture or focal lesion. Sinuses/Orbits: Opacification of a single right mastoid air cell is new from prior exam. Mastoid air cells are otherwise clear. Paranasal sinuses are clear. Orbits are unremarkable. Other: None. IMPRESSION: 1. No acute intracranial abnormality. No hemorrhage or evidence of acute ischemia. 2. Stable atrophy and chronic small vessel ischemia. Electronically Signed   By: Keith Rake M.D.   On: 09/24/2019 21:54   MR CERVICAL SPINE WO CONTRAST  Result Date: 09/25/2019 CLINICAL DATA:  Left-sided numbness or tingling. EXAM: MRI CERVICAL SPINE WITHOUT CONTRAST TECHNIQUE: Multiplanar, multisequence MR imaging of the cervical spine was performed. No intravenous contrast was administered. COMPARISON:  None. FINDINGS: Alignment: Straightening of the cervical spine. Vertebrae: No fracture, evidence of  discitis, or bone lesion. Cord: Normal signal and morphology. Posterior Fossa, vertebral arteries, paraspinal tissues: Negative. Disc levels: C2-3: Unremarkable. C3-4: Unremarkable. C4-5: Unremarkable. C5-6: Disc narrowing with left paracentral shallow protrusion. Negative facets. No neural compression C6-7: Greatest level of degenerative disc narrowing. Uncovertebral spurring and left-sided disc osteophyte complex causing high-grade foraminal impingement and mild left cord flattening. Negative facets. C7-T1:Unremarkable. IMPRESSION: C5-6 and C6-7 disc degeneration, more notable at C6-7 where there is prominent left uncovertebral and paracentral spurring causing foraminal impingement and mild left cord mass effect. No other level of left foraminal impingement. Electronically Signed   By: Monte Fantasia M.D.   On: 09/25/2019 07:05   MR LUMBAR SPINE WO CONTRAST  Result Date: 09/25/2019 CLINICAL DATA:  Left-sided numbness and pain. Left leg weakness upon awakening this morning EXAM: MRI LUMBAR  SPINE WITHOUT CONTRAST TECHNIQUE: Multiplanar, multisequence MR imaging of the lumbar spine was performed. No intravenous contrast was administered. COMPARISON:  None. FINDINGS: Segmentation:  Standard lumbar numbering. Alignment: Grade 1 anterolisthesis at L5-S1 that is associated with a right-sided chronic pars defect at L5. Vertebrae: Schmorl's node at the L2 superior endplate. No acute fracture, discitis, or aggressive bone lesion. Conus medullaris and cauda equina: Conus extends to the L1-2 level. Conus and cauda equina appear normal. Paraspinal and other soft tissues: Negative Disc levels: L1-L2: Mild disc bulging. L2-L3: Mild disc bulging. L3-L4: Mild disc bulging. L4-L5: Disc bulging and left foraminal annular fissure with protrusion causing foraminal impingement when combined with mild facet spurring. Disc bulging also narrows the right foramen to a milder degree. Widely patent canal. L5-S1:Right-sided L5 pars  defect with anterolisthesis. Greatest level of degenerative disc narrowing with left eccentric far-lateral spurring. Bilateral foraminal narrowing which is somewhat prominent on sagittal images but noncompressive based on axial slices. Widely patent canal. IMPRESSION: 1. L4-5 left foraminal protrusion with moderate left L4 impingement. 2. 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 Noncompressive foraminal narrowing at this level on the left more than right. 3. Diffusely patent spinal canal. Electronically Signed   By: Monte Fantasia M.D.   On: 09/25/2019 07:09        Scheduled Meds: . apixaban  5 mg Oral BID  . calcium carbonate  1 tablet Oral Q breakfast  . calcium carbonate  800 mg of elemental calcium Oral BID  . carvedilol  3.125 mg Oral BID WC  . Chlorhexidine Gluconate Cloth  6 each Topical Q0600  . [START ON 09/26/2019] doxercalciferol  1.5 mcg Intravenous Q T,Th,Sa-HD  . multivitamin  1 tablet Oral Daily  . patiromer  8.4 g Oral Once per day on Mon Wed Fri   Continuous Infusions:   LOS: 0 days    Time spent: 35mins    Kathie Dike, MD [facility]   If 7PM-7AM, please contact night-coverage [email or web address]  09/25/2019, 10:00 AM

## 2019-09-25 NOTE — Progress Notes (Signed)
Found out that this patient was in the ER-  C/o acute onset left sided weakness and numbness.  MRI shows C5-6 and C6-7 disc degeneration, more notable at C6-7 where there is prominent left uncovertebral and paracentral spurring causing foraminal impingement and mild left cord mass effect. No other level of left foraminal impingement.      Is still symptomatic, not sure what the plan is- pt is currently obs status   He gets HD at [facility] MWF via [facility]-  More compliant of late  4 hours 15 min- L AVG-  300/600 1 K /2.5 calc bath, EDW 93 Gets heparin with HD-  Also 800 epo and 1.5 of hectorol and 50 of venofer weekly     Labs do not show immediate need for HD-  We have a lot of inpatients to do at this hospital today so I will plan on running his dialysis next tomorrow off schedule.  If is discharged will need to check with [facility] to see if they can run tomorrow vs him just going back on Friday for scheduled treatment  [place]

## 2019-09-25 NOTE — Evaluation (Signed)
Physical Therapy Evaluation Patient Details Name: Brendan Campbell MRN: [id number] DOB: [birth year] Today's Date: 09/25/2019   History of Present Illness  Brendan Campbell is a 49 y.o. male with medical history significant for  hypertension, hyperlipidemia, diabetes mellitus type 2, ESRD on hemodialysis, atrial fibrillation on anticoagulation with Eliquis, right BKA, diabetic neuropathy and hypocalcemia who presents to the  emergency department due to left arm and leg pain and numbness which started on awakening around 6 AM today.  Patient states " I feel like my left arm and leg were dead" and he complained of pain that starts from left leg and shoots upward to his left arm.  Patient states that he had a similar presentation in June during which stroke work-up was done and had negative MRI, echocardiogram and carotid ultrasound.  It was then thought to be due to low calcium level.  Patient states that he does have occasional sensation of numbness in extremities including right arm (though usually worse on the left) and this improves transitorily when he takes Tums, he started taking over-the-counter calcium pills which appeared to help as well but improvement was only short-term.  He denies fever, chills, nausea, vomiting, abdominal pain.    Clinical Impression  Patient with c/o L sided weakness and numbness. Patient demonstrates decreased hip flexor strength on LLE but good strength in L quads/hamstrings. Patient has difficulty with testing of RLE secondary to BKA. Upon further testing, patient notes changes in LUE symptoms with movements of the cervical spine in L rotation and c/o neck pain with extension and L lateral flexion. Symptoms may be related to cervical spine pathology. Patient able to complete bed mobility and transfer independently but slower as well as independent with don/doffing prosthesis. Patient ambulates without loss of balance with use of SPC but does not use properly and holds cane in R  hand despite cueing to try in left hand because c/o it "feeling weird" in LUE. Patient would benefit from outpatient physical therapy to further assess cervical spine. Patient discharged to care of nursing for ambulation daily as tolerated for length of stay.     Follow Up Recommendations Outpatient PT    Equipment Recommendations  None recommended by PT    Recommendations for Other Services       Precautions / Restrictions Precautions Precautions: Fall Precaution Comments: R BKA Required Braces or Orthoses: Other Brace (Prosthesis RLE) Restrictions Weight Bearing Restrictions: No      Mobility  Bed Mobility Overal bed mobility: Modified Independent             General bed mobility comments: slightly slow, labored, able to don/doff Prosthesis independently  Transfers Overall transfer level: Modified independent Equipment used: Straight cane             General transfer comment: slightly slow, min unsteadiness upon initial standing with SPC  Ambulation/Gait Ambulation/Gait assistance: Modified independent (Device/Increase time) Gait Distance (Feet): 120 Feet Assistive device: Straight cane Gait Pattern/deviations: Step-through pattern Gait velocity: decreased   General Gait Details: Patient ambulates with SPC, with decreased gait speed, slightly unsteady; verbal cueing for proper SPC use but does not like to use in LUE secondary to "feeling weird"  Stairs            Wheelchair Mobility    Modified Rankin (Stroke Patients Only)       Balance Overall balance assessment: Needs assistance Sitting-balance support: No upper extremity supported Sitting balance-Leahy Scale: Good Sitting balance - Comments: seated EOB   Standing  balance support: Single extremity supported Standing balance-Leahy Scale: Good Standing balance comment: with SPC                             Pertinent Vitals/Pain Pain Assessment: 0-10 Pain Score: 5  Pain  Location: LUE Pain Descriptors / Indicators: Numbness Pain Intervention(s): Limited activity within patient's tolerance;Monitored during session;Repositioned    Home Living Family/patient expects to be discharged to:: Private residence Living Arrangements: Non-relatives/Friends Available Help at Discharge: Friend(s) Type of Home: Mobile home Home Access: Ramped entrance     Home Layout: One level Home Equipment: Cane - single point;Wheelchair - manual;Shower seat      Prior Function Level of Independence: Independent with assistive device(s)         Comments: Patient statec community ambulation with SPC and RLE Prosthesis     Hand Dominance        Extremity/Trunk Assessment   Upper Extremity Assessment Upper Extremity Assessment: Defer to OT evaluation    Lower Extremity Assessment Lower Extremity Assessment: Generalized weakness (LLE 3+/5, R LE 4/5 hip flexion; L knee ext 4/5, flex 5/5)       Communication   Communication: No difficulties  Cognition Arousal/Alertness: Awake/alert Behavior During Therapy: WFL for tasks assessed/performed Overall Cognitive Status: Within Functional Limits for tasks assessed                                        General Comments      Exercises     Assessment/Plan    PT Assessment All further PT needs can be met in the next venue of care  PT Problem List Decreased strength;Decreased activity tolerance;Decreased balance;Decreased mobility;Decreased knowledge of use of DME       PT Treatment Interventions      PT Goals (Current goals can be found in the Care Plan section)  Acute Rehab PT Goals Patient Stated Goal: Figure out what is going on PT Goal Formulation: With patient Time For Goal Achievement: 09/25/19 Potential to Achieve Goals: Good    Frequency     Barriers to discharge        Co-evaluation               AM-PAC PT "6 Clicks" Mobility  Outcome Measure Help needed turning from  your back to your side while in a flat bed without using bedrails?: None Help needed moving from lying on your back to sitting on the side of a flat bed without using bedrails?: None Help needed moving to and from a bed to a chair (including a wheelchair)?: None Help needed standing up from a chair using your arms (e.g., wheelchair or bedside chair)?: None Help needed to walk in hospital room?: A Little Help needed climbing 3-5 steps with a railing? : A Little 6 Click Score: 22    End of Session   Activity Tolerance: Patient tolerated treatment well Patient left: in bed;with call bell/phone within reach Nurse Communication: Mobility status PT Visit Diagnosis: Unsteadiness on feet (R26.81);Other abnormalities of gait and mobility (R26.89);Muscle weakness (generalized) (M62.81)    Time: 6734-1937 PT Time Calculation (min) (ACUTE ONLY): 24 min   Charges:   PT Evaluation $PT Eval Moderate Complexity: 1 Mod PT Treatments $Therapeutic Activity: 8-22 mins       11:25 AM, 09/25/19 Mearl Latin PT, DPT Physical Therapist at [facility]  [facility]

## 2019-09-25 NOTE — ED Notes (Signed)
Pt does not void.

## 2019-09-25 NOTE — Consult Note (Signed)
Brendan A. [name], MD     [email or web address]          Brendan Campbell is an 49 y.o. male.   ASSESSMENT/PLAN: 1. Single episode of flexor spasm of the left upper extremity associated with marked weakness of the left lower extremity. Etiology is unclear. The patient does have cervical disc disease but it does not necessarily appear to be severe to cause his symptoms. There is some potential compression on the exiting nerve root on the left side at the C6-7 but this seems unlikely to explain the entire picture. There seems not to be significant evidence of cord compression to explain his symptoms. However, I think it is worthwhile given the patient a couple doses of steroids to see if this is beneficial. Solu-Medrol will be given. Given the associated flexor spasm of the left upper extremity, seizure should be considered and therefore an EEG is ordered. 2. Severe diabetic polyneuropathy    The patient is a 49 year old white male who presents with the acute onset of flexor spasms of the left hand. This was associated with the what appears to be complete flaccidity of the left lower extremity. He reports that the left side of the seem to be associated with numbness distally. The patient does not report alteration of consciousness, dysarthria, dysphagia or clear clonic activity. There are no reports of palpitation or chest pain. The patient reports that he has had neck pain off and on recently. He also reports sometimes having spasms of the left lower extremity especially when he bends knees. The patient reports that the spell lasted for about 15 minutes. Later on the day he reports having neck pain and headache. The patient had an episode of weakness involving the left side about 2 months ago. This was associated with metabolic derangements particular hypocalcemia. The work-up was unrevealing including MRI at that time. He did have some cardiac abnormalities associated with that  event.  GENERAL: This a pleasant overweight chronically appearing male who is in no acute distress.  HEENT: Neck is supple no trauma noted.  ABDOMEN: soft  EXTREMITIES: No edema; right below the knee amputation noted. Left transtarsal amputations also noted. Fistula is noted in the left arm. No Tinel's sign at the elbow.  BACK: Normal  SKIN: Normal by inspection.    MENTAL STATUS: Alert and oriented. Speech, language and cognition are generally intact. Judgment and insight normal.   CRANIAL NERVES: Pupils are equal, round and reactive to light and accomodation; extra ocular movements are full, there is no significant nystagmus; visual fields are full; upper and lower facial muscles are normal in strength and symmetric, there is no flattening of the nasolabial folds; tongue is midline; uvula is midline; shoulder elevation is normal.  MOTOR: There is significant atrophy of the dorsal interossei muscles on the left associated with weakness of finger abduction. No significant atrophy noted of the right hand. Upper extremities otherwise normal. Mild weakness of left plantar flexion. Dorsiflexion is normal on the left. Hip flexion is also normal. Right hip flexion is normal.  COORDINATION: Left finger to nose is normal, right finger to nose is normal, No rest tremor; no intention tremor; no postural tremor; no bradykinesia.  REFLEXES: Deep tendon reflexes are symmetrical and diminished in the upper extremities but absent at the brachioradialis knees and left ankle.  SENSATION: Diminished to temperature and light touch mildly left upper extremity left leg.       Blood pressure (!) 142/72, pulse 76, temperature 97.7  F (36.5 C), temperature source Oral, resp. rate 18, height 6' (1.829 m), weight 93.5 kg, SpO2 98 %.  Past Medical History:  Diagnosis Date  . Anemia   . Anxiety   . Blood transfusion without reported diagnosis   . CHF (congestive heart failure) (HCC)    a. EF 25% by echo  in 07/2019  . Chronic kidney disease   . Depression   . Diabetes mellitus without complication ([place])   . End stage renal disease ([place])    M/W/F [facility]  . Hyperlipidemia   . Neuropathy   . Peripheral vascular disease ([place])   . PTSD (post-traumatic stress disorder)     Past Surgical History:  Procedure Laterality Date  . A/V FISTULAGRAM N/A 10/09/2018   Procedure: A/V FISTULAGRAM;  Surgeon: Serafina Mitchell, MD;  Location: [place] [facility];  Service: Cardiovascular;  Laterality: N/A;  . AV FISTULA PLACEMENT Left 09/22/2016   Procedure: CREATION OF LEFT ARM ARTERIOVENOUS (AV) FISTULA;  Surgeon: Waynetta Sandy, MD;  Location: [place];  Service: Vascular;  Laterality: Left;  . AV FISTULA PLACEMENT Left 10/31/2017   Procedure: INSERTION OF ARTERIOVENOUS (AV) GORE-TEX 4-46mm STETCH GRAFT LEFT ARM;  Surgeon: Waynetta Sandy, MD;  Location: [place];  Service: Vascular;  Laterality: Left;  . BASCILIC VEIN TRANSPOSITION Left 08/17/2017   Procedure: SECOND STAGE BASILIC VEIN TRANSPOSITION LEFT ARM;  Surgeon: Waynetta Sandy, MD;  Location: [place];  Service: Vascular;  Laterality: Left;  . BELOW KNEE LEG AMPUTATION Right   . CARDIOVERSION N/A 02/19/2019   Procedure: CARDIOVERSION;  Surgeon: [name], MD;  Location: [facility];  Service: Cardiovascular;  Laterality: N/A;  . CHOLECYSTECTOMY    . FOOT SURGERY    . IR FLUORO GUIDE CV LINE RIGHT  05/16/2016  . IR FLUORO GUIDE CV LINE RIGHT  02/16/2019  . IR REMOVAL TUN CV CATH W/O FL  05/16/2016  . IR REMOVAL TUN CV CATH W/O FL  02/19/2019  . IR THROMBECTOMY AV FISTULA W/THROMBOLYSIS/PTA INC/SHUNT/IMG LEFT Left 11/09/2018  . IR THROMBECTOMY AV FISTULA W/THROMBOLYSIS/PTA INC/SHUNT/IMG LEFT Left 06/22/2019  . IR US GUIDE VASC ACCESS LEFT  11/09/2018  . IR US GUIDE VASC ACCESS LEFT  06/22/2019  . IR US GUIDE VASC ACCESS RIGHT  05/16/2016  . IR US GUIDE VASC ACCESS RIGHT  02/16/2019  . PERIPHERAL VASCULAR BALLOON ANGIOPLASTY   10/09/2018   Procedure: PERIPHERAL VASCULAR BALLOON ANGIOPLASTY;  Surgeon: Serafina Mitchell, MD;  Location: [place] [facility];  Service: Cardiovascular;;  . TEE WITHOUT CARDIOVERSION N/A 02/19/2019   Procedure: TRANSESOPHAGEAL ECHOCARDIOGRAM (TEE);  Surgeon: [name], MD;  Location: [facility] ENDOSCOPY;  Service: Cardiovascular;  Laterality: N/A;    Family History  Problem Relation Age of Onset  . Cancer Mother        lung  . Diabetes Mother   . Heart attack Father   . Diabetes Father   . Diabetes Sister     Social History:  reports that he has been smoking. He has never used smokeless tobacco. He reports that he does not drink alcohol and does not use drugs.  Allergies:  Allergies  Allergen Reactions  . Tape Other (See Comments)    Pulls skin off    Medications: Prior to Admission medications   Medication Sig Start Date End Date Taking? Authorizing Provider  apixaban (ELIQUIS) 5 MG TABS tablet Take 1 tablet (5 mg total) by mouth 2 (two) times daily. 08/23/19  Yes Dyer, Fransisco Hertz,  PA-C  aspirin 81 MG chewable tablet Chew 81 mg by mouth daily.   Yes [provider]  atorvastatin (LIPITOR) 10 MG tablet Take 1 tablet (10 mg total) by mouth every evening. 08/03/19  Yes Barton Dubois, MD  calcium acetate (PHOSLO) 667 MG capsule Take 1 capsule (667 mg total) by mouth 3 (three) times daily with meals. 02/21/19  Yes Donnamae Jude, MD  calcium carbonate (TUMS - DOSED IN MG ELEMENTAL CALCIUM) 500 MG chewable tablet Chew 4 tablets (800 mg of elemental calcium total) by mouth 2 (two) times daily. 08/03/19  Yes Barton Dubois, MD  carvedilol (COREG) 3.125 MG tablet Take 1 tablet (3.125 mg total) by mouth 2 (two) times daily with a meal. 08/23/19  Yes Strader, [place] M, PA-C  LANTUS SOLOSTAR 100 UNIT/ML Solostar Pen Inject 10 Units into the skin at bedtime.  01/18/19  Yes [provider]  lidocaine-prilocaine (EMLA) cream Apply 1 application topically See admin instructions.  Applied three times weekly at dialysis on MWF 05/06/19  Yes [provider]  multivitamin (RENA-VIT) TABS tablet Take 1 tablet by mouth daily. 08/04/18  Yes [provider]  patiromer (VELTASSA) 8.4 g packet Take 1 packet (8.4 g total) by mouth 3 (three) times a week. On Tuesday, Thursday and Saturday 06/10/19  Yes Kathie Dike, MD    Scheduled Meds: . atorvastatin  10 mg Oral QHS  . calcium carbonate  1 tablet Oral Q breakfast  . calcium carbonate  800 mg of elemental calcium Oral BID  . carvedilol  3.125 mg Oral BID WC  . Chlorhexidine Gluconate Cloth  6 each Topical Q0600  . [START ON 09/26/2019] doxercalciferol  1.5 mcg Intravenous Q T,Th,Sa-HD  . insulin aspart  0-5 Units Subcutaneous QHS  . insulin aspart  0-9 Units Subcutaneous TID WC  . insulin glargine  10 Units Subcutaneous QHS  . multivitamin  1 tablet Oral Daily  . patiromer  8.4 g Oral Once per day on Mon Wed Fri   Continuous Infusions: PRN Meds:.     Results for orders placed or performed during the hospital encounter of 09/24/19 (from the past 48 hour(s))  Ethanol     Status: None   Collection Time: 09/24/19  8:43 PM  Result Value Ref Range   Alcohol, Ethyl (B) <10 <10 mg/dL    Comment: (NOTE) Lowest detectable limit for serum alcohol is 10 mg/dL.  For medical purposes only. Performed at [facility], [address] [place], [place] [address]   Protime-INR     Status: None   Collection Time: 09/24/19  8:43 PM  Result Value Ref Range   Prothrombin Time 13.7 11.4 - 15.2 seconds   INR 1.1 0.8 - 1.2    Comment: (NOTE) INR goal varies based on device and disease states. Performed at [facility], [address], [place], [place] [address]   APTT     Status: Abnormal   Collection Time: 09/24/19  8:43 PM  Result Value Ref Range   aPTT 37 (H) 24 - 36 seconds    Comment:        IF BASELINE aPTT IS ELEVATED, SUGGEST PATIENT RISK ASSESSMENT BE USED TO DETERMINE APPROPRIATE ANTICOAGULANT  THERAPY. Performed at [facility], [address], [place], [place] [address]   CBC     Status: Abnormal   Collection Time: 09/24/19  8:43 PM  Result Value Ref Range   WBC 7.1 4.0 - 10.5 K/uL   RBC 3.41 (L) 4.22 - 5.81 MIL/uL  Hemoglobin 11.4 (L) 13.0 - 17.0 g/dL   HCT 36.4 (L) 39 - 52 %   MCV 106.7 (H) 80.0 - 100.0 fL   MCH 33.4 26.0 - 34.0 pg   MCHC 31.3 30.0 - 36.0 g/dL   RDW 13.5 11.5 - 15.5 %   Platelets 228 150 - 400 K/uL   nRBC 0.0 0.0 - 0.2 %    Comment: Performed at [facility], [address] [place], [place] [address]  Differential     Status: None   Collection Time: 09/24/19  8:43 PM  Result Value Ref Range   Neutrophils Relative % 61 %   Neutro Abs 4.4 1.7 - 7.7 K/uL   Lymphocytes Relative 26 %   Lymphs Abs 1.8 0.7 - 4.0 K/uL   Monocytes Relative 8 %   Monocytes Absolute 0.5 0 - 1 K/uL   Eosinophils Relative 4 %   Eosinophils Absolute 0.3 0 - 0 K/uL   Basophils Relative 1 %   Basophils Absolute 0.1 0 - 0 K/uL   Immature Granulocytes 0 %   Abs Immature Granulocytes 0.01 0.00 - 0.07 K/uL    Comment: Performed at [facility], [address], [place], [place] [address]  Comprehensive metabolic panel     Status: Abnormal   Collection Time: 09/24/19  8:43 PM  Result Value Ref Range   Sodium 137 135 - 145 mmol/L   Potassium 5.4 (H) 3.5 - 5.1 mmol/L   Chloride 93 (L) 98 - 111 mmol/L   CO2 30 22 - 32 mmol/L   Glucose, Bld 123 (H) 70 - 99 mg/dL    Comment: Glucose reference range applies only to samples taken after fasting for at least 8 hours.   BUN 41 (H) 6 - 20 mg/dL   Creatinine, Ser 8.15 (H) 0.61 - 1.24 mg/dL   Calcium 7.5 (L) 8.9 - 10.3 mg/dL   Total Protein 7.7 6.5 - 8.1 g/dL   Albumin 3.8 3.5 - 5.0 g/dL   AST 18 15 - 41 U/L   ALT 17 0 - 44 U/L   Alkaline Phosphatase 115 38 - 126 U/L   Total Bilirubin 0.4 0.3 - 1.2 mg/dL   GFR calc non Af Amer 7 (L) >60 mL/min   GFR calc Af Amer 8 (L) >60 mL/min   Anion gap 14 5 - 15    Comment: Performed at [facility], [address] [place],  [address]  SARS Coronavirus 2 by RT PCR (hospital order, performed in [facility] hospital lab) Nasopharyngeal Nasopharyngeal Swab     Status: None   Collection Time: 09/24/19  9:30 PM   Specimen: Nasopharyngeal Swab  Result Value Ref Range   SARS Coronavirus 2 NEGATIVE NEGATIVE    Comment: (NOTE) SARS-CoV-2 target nucleic acids are NOT DETECTED.  The SARS-CoV-2 RNA is generally detectable in upper and lower respiratory specimens during the acute phase of infection. The lowest concentration of SARS-CoV-2 viral copies this assay can detect is 250 copies / mL. A negative result does not preclude SARS-CoV-2 infection and should not be used as the sole basis for treatment or other patient management decisions.  A negative result may occur with improper specimen collection / handling, submission of specimen other than nasopharyngeal swab, presence of viral mutation(s) within the areas targeted by this assay, and inadequate number of viral copies (<250 copies / mL). A negative result must be combined with clinical observations, patient history, and epidemiological information.  Fact Sheet for Patients:   StrictlyIdeas.no  Fact Sheet for Healthcare Providers: BankingDealers.co.za  This test is not yet approved or  cleared by the [place] FDA and has been authorized for detection and/or diagnosis of SARS-CoV-2 by FDA under an Emergency Use Authorization (EUA).  This EUA will remain in effect (meaning this test can be used) for the duration of the COVID-19 declaration under Section 564(b)(1) of the Act, 21 U.S.C. section 360bbb-3(b)(1), unless the authorization is terminated or revoked sooner.  Performed at [facility], [address], [place], [place] [address]   Comprehensive metabolic panel     Status: Abnormal   Collection Time: 09/25/19  3:31 AM  Result Value Ref Range   Sodium 137 135 - 145  mmol/L   Potassium 4.7 3.5 - 5.1 mmol/L   Chloride 97 (L) 98 - 111 mmol/L   CO2 28 22 - 32 mmol/L   Glucose, Bld 118 (H) 70 - 99 mg/dL    Comment: Glucose reference range applies only to samples taken after fasting for at least 8 hours.   BUN 45 (H) 6 - 20 mg/dL   Creatinine, Ser 8.52 (H) 0.61 - 1.24 mg/dL   Calcium 7.7 (L) 8.9 - 10.3 mg/dL   Total Protein 6.7 6.5 - 8.1 g/dL   Albumin 3.4 (L) 3.5 - 5.0 g/dL   AST 16 15 - 41 U/L   ALT 17 0 - 44 U/L   Alkaline Phosphatase 113 38 - 126 U/L   Total Bilirubin 0.5 0.3 - 1.2 mg/dL   GFR calc non Af Amer 7 (L) >60 mL/min   GFR calc Af Amer 8 (L) >60 mL/min   Anion gap 12 5 - 15    Comment: Performed at [facility], [address], [place], [place] [address]  CBC     Status: Abnormal   Collection Time: 09/25/19  3:31 AM  Result Value Ref Range   WBC 7.3 4.0 - 10.5 K/uL   RBC 3.39 (L) 4.22 - 5.81 MIL/uL   Hemoglobin 11.2 (L) 13.0 - 17.0 g/dL   HCT 35.8 (L) 39 - 52 %   MCV 105.6 (H) 80.0 - 100.0 fL   MCH 33.0 26.0 - 34.0 pg   MCHC 31.3 30.0 - 36.0 g/dL   RDW 13.3 11.5 - 15.5 %   Platelets 223 150 - 400 K/uL   nRBC 0.0 0.0 - 0.2 %    Comment: Performed at [facility], [address] [place], [place] [address]  Protime-INR     Status: None   Collection Time: 09/25/19  3:31 AM  Result Value Ref Range   Prothrombin Time 14.1 11.4 - 15.2 seconds   INR 1.1 0.8 - 1.2    Comment: (NOTE) INR goal varies based on device and disease states. Performed at [facility], [address], [place], [place] [address]   APTT     Status: Abnormal   Collection Time: 09/25/19  3:31 AM  Result Value Ref Range   aPTT 44 (H) 24 - 36 seconds    Comment:        IF BASELINE aPTT IS ELEVATED, SUGGEST PATIENT RISK ASSESSMENT BE USED TO DETERMINE APPROPRIATE ANTICOAGULANT THERAPY. Performed at [facility], [address], [place], [place] [address]   Magnesium     Status: None   Collection Time: 09/25/19  3:31 AM  Result Value Ref Range   Magnesium 2.0  1.7 - 2.4 mg/dL    Comment: Performed at [facility], [address], [place], [place] [address]  Phosphorus  Status: Abnormal   Collection Time: 09/25/19  3:31 AM  Result Value Ref Range   Phosphorus 5.7 (H) 2.5 - 4.6 mg/dL    Comment: Performed at [facility], [address] [place], [place] [address]  Vitamin B12     Status: None   Collection Time: 09/25/19  3:31 AM  Result Value Ref Range   Vitamin B-12 444 180 - 914 pg/mL    Comment: (NOTE) This assay is not validated for testing neonatal or myeloproliferative syndrome specimens for Vitamin B12 levels. Performed at [facility], [address], [place], [place] [address]   Folate     Status: None   Collection Time: 09/25/19  3:31 AM  Result Value Ref Range   Folate 10.7 >5.9 ng/mL    Comment: Performed at [facility], [address], [place], [place] [address]  Glucose, capillary     Status: None   Collection Time: 09/25/19 11:50 AM  Result Value Ref Range   Glucose-Capillary 85 70 - 99 mg/dL    Comment: Glucose reference range applies only to samples taken after fasting for at least 8 hours.  Glucose, capillary     Status: None   Collection Time: 09/25/19  4:53 PM  Result Value Ref Range   Glucose-Capillary 95 70 - 99 mg/dL    Comment: Glucose reference range applies only to samples taken after fasting for at least 8 hours.    Studies/Results:  BRAIN MRI  FINDINGS: Brain: There is no acute infarction or intracranial hemorrhage. There is no intracranial mass, mass effect, or edema. There is no hydrocephalus or extra-axial fluid collection. Patchy and confluent areas of T2 hyperintensity in the supratentorial white matter are nonspecific but probably reflect moderate chronic microvascular ischemic changes. There is ex vacuo dilatation of the lateral ventricles.  Vascular: Major vessel flow voids at the skull base are preserved.  Skull and upper cervical spine: Normal marrow signal is  preserved.  Sinuses/Orbits: Paranasal sinuses are aerated. Orbits are unremarkable.  Other: Sella is unremarkable.  Mastoid air cells are clear.  IMPRESSION: No evidence of recent infarction, hemorrhage, or mass. Moderate chronic microvascular ischemic changes, greater than expected for age.    C SPINE MRI FINDINGS: Alignment: Straightening of the cervical spine.  Vertebrae: No fracture, evidence of discitis, or bone lesion.  Cord: Normal signal and morphology.  Posterior Fossa, vertebral arteries, paraspinal tissues: Negative.  Disc levels:  C2-3: Unremarkable.  C3-4: Unremarkable.  C4-5: Unremarkable.  C5-6: Disc narrowing with left paracentral shallow protrusion. Negative facets. No neural compression  C6-7: Greatest level of degenerative disc narrowing. Uncovertebral spurring and left-sided disc osteophyte complex causing high-grade foraminal impingement and mild left cord flattening. Negative facets.  C7-T1:Unremarkable.  IMPRESSION: C5-6 and C6-7 disc degeneration, more notable at C6-7 where there is prominent left uncovertebral and paracentral spurring causing foraminal impingement and mild left cord mass effect. No other level of left foraminal impingement.    L SPINE MRI FINDINGS: Segmentation:  Standard lumbar numbering.  Alignment: Grade 1 anterolisthesis at L5-S1 that is associated with a right-sided chronic pars defect at L5.  Vertebrae: Schmorl's node at the L2 superior endplate. No acute fracture, discitis, or aggressive bone lesion.  Conus medullaris and cauda equina: Conus extends to the L1-2 level. Conus and cauda equina appear normal.  Paraspinal and other soft tissues: Negative  Disc levels:  L1-L2: Mild disc bulging.  L2-L3: Mild disc bulging.  L3-L4: Mild disc bulging.  L4-L5: Disc bulging and left foraminal annular fissure with protrusion  causing foraminal impingement when combined with mild facet  spurring. Disc bulging also narrows the right foramen to a milder degree. Widely patent canal.  L5-S1:Right-sided L5 pars defect with anterolisthesis. Greatest level of degenerative disc narrowing with left eccentric far-lateral spurring. Bilateral foraminal narrowing which is somewhat prominent on sagittal images but noncompressive based on axial slices. Widely patent canal.  IMPRESSION: 1. L4-5 left foraminal protrusion with moderate left L4 impingement. 2. 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 Noncompressive foraminal narrowing at this level on the left more than right. 3. Diffusely patent spinal canal.        The brain MRI is reviewed in person and shows no acute changes on DWI.  No hemorrhages noted on SWI.  There is mild to moderate global atrophy especially given his age.There appears to be more pronounced volume loss in the temporal areas bilaterally with the more prominent sylvian fissures. There is moderate periventricular leukoencephalopathy somewhat more on the left side.  No encephalomalacia is noted.      Gottfried Standish A. Merlene Campbell, M.D.  Diplomate, Tax adviser of Psychiatry and Neurology ( Neurology). 09/25/2019, 6:29 PM

## 2019-09-25 NOTE — ED Notes (Signed)
Patient transported to MRI via stretcher.

## 2019-09-25 NOTE — ED Notes (Signed)
PT at bedside to eval and treat.

## 2019-09-26 ENCOUNTER — Inpatient Hospital Stay (HOSPITAL_COMMUNITY)
Admit: 2019-09-26 | Discharge: 2019-09-26 | Disposition: A | Discharge status: Home / Self Care | Payer: Medicaid Other | Attending: Neurology | Admitting: Neurology

## 2019-09-26 ENCOUNTER — Ambulatory Visit: Payer: Medicaid Other | Admitting: Student

## 2019-09-26 LAB — GLUCOSE, CAPILLARY
Glucose-Capillary: 73 mg/dL (ref 70–99)
Glucose-Capillary: 77 mg/dL (ref 70–99)
Glucose-Capillary: 86 mg/dL (ref 70–99)
Glucose-Capillary: 207 mg/dL — ABNORMAL HIGH (ref 70–99)

## 2019-09-26 MED ORDER — PENTAFLUOROPROP-TETRAFLUOROETH EX AERO: 1.0000 "application " | INHALATION_SPRAY | CUTANEOUS | Status: DC | PRN

## 2019-09-26 MED ORDER — SODIUM CHLORIDE 0.9 % IV SOLN: 100.0000 mL | INTRAVENOUS | Status: DC | PRN

## 2019-09-26 MED ORDER — HEPARIN SODIUM (PORCINE) 1000 UNIT/ML DIALYSIS: 20.0000 [IU]/kg | INTRAMUSCULAR | Status: DC | PRN

## 2019-09-26 MED ORDER — LIDOCAINE-PRILOCAINE 2.5-2.5 % EX CREA: 1.0000 "application " | TOPICAL_CREAM | CUTANEOUS | Status: DC | PRN

## 2019-09-26 MED ORDER — APIXABAN 5 MG PO TABS
5.0000 mg | ORAL_TABLET | Freq: Two times a day (BID) | ORAL | Status: DC
  Administered 2019-09-26 – 2019-09-27 (×2): 5 mg via ORAL
  Filled 2019-09-26 (×3): qty 1

## 2019-09-26 MED ORDER — LIDOCAINE HCL (PF) 1 % IJ SOLN: 5.0000 mL | INTRAMUSCULAR | Status: DC | PRN

## 2019-09-26 NOTE — Consult Note (Signed)
Reason for Consult: To manage dialysis and dialysis related needs Referring Physician: [name] is an 49 y.o. male with past medical history significant for hypertension, type 2 diabetes mellitus, hyperlipidemia, atrial fibrillation on Eliquis, depressed ejection fraction, severe diabetic neuropathy with right BKA.  He also has ESRD, dialysis Monday Wednesday Friday at [facility].  Many hospitalizations and trips to the emergency department.  He presented to the [facility] emergency department on 8/17 with acute onset left-sided numbness and weakness.  Work-up has been done including MRI of brain, C-spine and T-spine.  Neurosurgery and neurology have been involved.  The MRI of the C-spine indicated possible nerve compression.  Neurology suggested steroids to see if symptoms would improve.  They have also ordered an EEG.  Yesterday would have been his dialysis day but labs did not indicate dialysis need.  Due to patient volume yesterday he was unable to be done.  However, dialysis is planned today off schedule.  He reports same sxms this AM   HD at [facility] MWF via AVG-  More compliant of late  4 hours 15 min- L AVG-  300/600 1 K /2.5 calc bath, EDW 93 Gets heparin with HD-  Also 800 units epo and 1.5 of hectorol and 50 of venofer weekly   Past Medical History:  Diagnosis Date  . Anemia   . Anxiety   . Blood transfusion without reported diagnosis   . CHF (congestive heart failure) (HCC)    a. EF 25% by echo in 07/2019  . Chronic kidney disease   . Depression   . Diabetes mellitus without complication ([name])   . End stage renal disease ([place])    M/W/F [facility]  . Hyperlipidemia   . Neuropathy   . Peripheral vascular disease ([place])   . PTSD (post-traumatic stress disorder)     Past Surgical History:  Procedure Laterality Date  . A/V FISTULAGRAM N/A 10/09/2018   Procedure: A/V FISTULAGRAM;  Surgeon: Serafina Mitchell, MD;  Location: [place] [facility];  Service:  Cardiovascular;  Laterality: N/A;  . AV FISTULA PLACEMENT Left 09/22/2016   Procedure: CREATION OF LEFT ARM ARTERIOVENOUS (AV) FISTULA;  Surgeon: Waynetta Sandy, MD;  Location: [place];  Service: Vascular;  Laterality: Left;  . AV FISTULA PLACEMENT Left 10/31/2017   Procedure: INSERTION OF ARTERIOVENOUS (AV) GORE-TEX 4-58mm STETCH GRAFT LEFT ARM;  Surgeon: Waynetta Sandy, MD;  Location: [place];  Service: Vascular;  Laterality: Left;  . BASCILIC VEIN TRANSPOSITION Left 08/17/2017   Procedure: SECOND STAGE BASILIC VEIN TRANSPOSITION LEFT ARM;  Surgeon: Waynetta Sandy, MD;  Location: [place];  Service: Vascular;  Laterality: Left;  . BELOW KNEE LEG AMPUTATION Right   . CARDIOVERSION N/A 02/19/2019   Procedure: CARDIOVERSION;  Surgeon: [name], MD;  Location: [facility] ENDOSCOPY;  Service: Cardiovascular;  Laterality: N/A;  . CHOLECYSTECTOMY    . FOOT SURGERY    . IR FLUORO GUIDE CV LINE RIGHT  05/16/2016  . IR FLUORO GUIDE CV LINE RIGHT  02/16/2019  . IR REMOVAL TUN CV CATH W/O FL  05/16/2016  . IR REMOVAL TUN CV CATH W/O FL  02/19/2019  . IR THROMBECTOMY AV FISTULA W/THROMBOLYSIS/PTA INC/SHUNT/IMG LEFT Left 11/09/2018  . IR THROMBECTOMY AV FISTULA W/THROMBOLYSIS/PTA INC/SHUNT/IMG LEFT Left 06/22/2019  . IR US GUIDE VASC ACCESS LEFT  11/09/2018  . IR US GUIDE VASC ACCESS LEFT  06/22/2019  . IR US GUIDE VASC ACCESS RIGHT  05/16/2016  . IR US  GUIDE VASC ACCESS RIGHT  02/16/2019  . PERIPHERAL VASCULAR BALLOON ANGIOPLASTY  10/09/2018   Procedure: PERIPHERAL VASCULAR BALLOON ANGIOPLASTY;  Surgeon: Serafina Mitchell, MD;  Location: [place] [facility];  Service: Cardiovascular;;  . TEE WITHOUT CARDIOVERSION N/A 02/19/2019   Procedure: TRANSESOPHAGEAL ECHOCARDIOGRAM (TEE);  Surgeon: [name], MD;  Location: [facility] ENDOSCOPY;  Service: Cardiovascular;  Laterality: N/A;    Family History  Problem Relation Age of Onset  . Cancer Mother        lung  . Diabetes Mother   . Heart attack  Father   . Diabetes Father   . Diabetes Sister     Social History:  reports that he has been smoking. He has never used smokeless tobacco. He reports that he does not drink alcohol and does not use drugs.  Allergies:  Allergies  Allergen Reactions  . Tape Other (See Comments)    Pulls skin off    Medications: I have reviewed the patient's current medications.   Results for orders placed or performed during the hospital encounter of 09/24/19 (from the past 48 hour(s))  Ethanol     Status: None   Collection Time: 09/24/19  8:43 PM  Result Value Ref Range   Alcohol, Ethyl (B) <10 <10 mg/dL    Comment: (NOTE) Lowest detectable limit for serum alcohol is 10 mg/dL.  For medical purposes only. Performed at [facility], [address] [place], [place] [address]   Protime-INR     Status: None   Collection Time: 09/24/19  8:43 PM  Result Value Ref Range   Prothrombin Time 13.7 11.4 - 15.2 seconds   INR 1.1 0.8 - 1.2    Comment: (NOTE) INR goal varies based on device and disease states. Performed at [facility], [address], [place], [place] [address]   APTT     Status: Abnormal   Collection Time: 09/24/19  8:43 PM  Result Value Ref Range   aPTT 37 (H) 24 - 36 seconds    Comment:        IF BASELINE aPTT IS ELEVATED, SUGGEST PATIENT RISK ASSESSMENT BE USED TO DETERMINE APPROPRIATE ANTICOAGULANT THERAPY. Performed at [facility], [address], [place], [place] [address]   CBC     Status: Abnormal   Collection Time: 09/24/19  8:43 PM  Result Value Ref Range   WBC 7.1 4.0 - 10.5 K/uL   RBC 3.41 (L) 4.22 - 5.81 MIL/uL   Hemoglobin 11.4 (L) 13.0 - 17.0 g/dL   HCT 36.4 (L) 39 - 52 %   MCV 106.7 (H) 80.0 - 100.0 fL   MCH 33.4 26.0 - 34.0 pg   MCHC 31.3 30.0 - 36.0 g/dL   RDW 13.5 11.5 - 15.5 %   Platelets 228 150 - 400 K/uL   nRBC 0.0 0.0 - 0.2 %    Comment: Performed at [facility], [address], [place],  [address]  Differential     Status: None    Collection Time: 09/24/19  8:43 PM  Result Value Ref Range   Neutrophils Relative % 61 %   Neutro Abs 4.4 1.7 - 7.7 K/uL   Lymphocytes Relative 26 %   Lymphs Abs 1.8 0.7 - 4.0 K/uL   Monocytes Relative 8 %   Monocytes Absolute 0.5 0 - 1 K/uL   Eosinophils Relative 4 %   Eosinophils Absolute 0.3 0 - 0 K/uL   Basophils Relative 1 %   Basophils Absolute 0.1 0 - 0 K/uL  Immature Granulocytes 0 %   Abs Immature Granulocytes 0.01 0.00 - 0.07 K/uL    Comment: Performed at [facility], [address] [place], [place] [address]  Comprehensive metabolic panel     Status: Abnormal   Collection Time: 09/24/19  8:43 PM  Result Value Ref Range   Sodium 137 135 - 145 mmol/L   Potassium 5.4 (H) 3.5 - 5.1 mmol/L   Chloride 93 (L) 98 - 111 mmol/L   CO2 30 22 - 32 mmol/L   Glucose, Bld 123 (H) 70 - 99 mg/dL    Comment: Glucose reference range applies only to samples taken after fasting for at least 8 hours.   BUN 41 (H) 6 - 20 mg/dL   Creatinine, Ser 8.15 (H) 0.61 - 1.24 mg/dL   Calcium 7.5 (L) 8.9 - 10.3 mg/dL   Total Protein 7.7 6.5 - 8.1 g/dL   Albumin 3.8 3.5 - 5.0 g/dL   AST 18 15 - 41 U/L   ALT 17 0 - 44 U/L   Alkaline Phosphatase 115 38 - 126 U/L   Total Bilirubin 0.4 0.3 - 1.2 mg/dL   GFR calc non Af Amer 7 (L) >60 mL/min   GFR calc Af Amer 8 (L) >60 mL/min   Anion gap 14 5 - 15    Comment: Performed at [facility], [address] [place], [place] [address]  SARS Coronavirus 2 by RT PCR (hospital order, performed in [facility] hospital lab) Nasopharyngeal Nasopharyngeal Swab     Status: None   Collection Time: 09/24/19  9:30 PM   Specimen: Nasopharyngeal Swab  Result Value Ref Range   SARS Coronavirus 2 NEGATIVE NEGATIVE    Comment: (NOTE) SARS-CoV-2 target nucleic acids are NOT DETECTED.  The SARS-CoV-2 RNA is generally detectable in upper and lower respiratory specimens during the acute phase of infection. The lowest concentration of SARS-CoV-2 viral copies this assay can  detect is 250 copies / mL. A negative result does not preclude SARS-CoV-2 infection and should not be used as the sole basis for treatment or other patient management decisions.  A negative result may occur with improper specimen collection / handling, submission of specimen other than nasopharyngeal swab, presence of viral mutation(s) within the areas targeted by this assay, and inadequate number of viral copies (<250 copies / mL). A negative result must be combined with clinical observations, patient history, and epidemiological information.  Fact Sheet for Patients:   StrictlyIdeas.no  Fact Sheet for Healthcare Providers: BankingDealers.co.za  This test is not yet approved or  cleared by the [place] FDA and has been authorized for detection and/or diagnosis of SARS-CoV-2 by FDA under an Emergency Use Authorization (EUA).  This EUA will remain in effect (meaning this test can be used) for the duration of the COVID-19 declaration under Section 564(b)(1) of the Act, 21 U.S.C. section 360bbb-3(b)(1), unless the authorization is terminated or revoked sooner.  Performed at [facility], [address], [place],  [address]   Comprehensive metabolic panel     Status: Abnormal   Collection Time: 09/25/19  3:31 AM  Result Value Ref Range   Sodium 137 135 - 145 mmol/L   Potassium 4.7 3.5 - 5.1 mmol/L   Chloride 97 (L) 98 - 111 mmol/L   CO2 28 22 - 32 mmol/L   Glucose, Bld 118 (H) 70 - 99 mg/dL    Comment: Glucose reference range applies only to samples taken after fasting for at least 8 hours.   BUN  45 (H) 6 - 20 mg/dL   Creatinine, Ser 8.52 (H) 0.61 - 1.24 mg/dL   Calcium 7.7 (L) 8.9 - 10.3 mg/dL   Total Protein 6.7 6.5 - 8.1 g/dL   Albumin 3.4 (L) 3.5 - 5.0 g/dL   AST 16 15 - 41 U/L   ALT 17 0 - 44 U/L   Alkaline Phosphatase 113 38 - 126 U/L   Total Bilirubin 0.5 0.3 - 1.2 mg/dL   GFR calc non Af Amer 7 (L) >60 mL/min    GFR calc Af Amer 8 (L) >60 mL/min   Anion gap 12 5 - 15    Comment: Performed at [facility], [address], [place], [place] [address]  CBC     Status: Abnormal   Collection Time: 09/25/19  3:31 AM  Result Value Ref Range   WBC 7.3 4.0 - 10.5 K/uL   RBC 3.39 (L) 4.22 - 5.81 MIL/uL   Hemoglobin 11.2 (L) 13.0 - 17.0 g/dL   HCT 35.8 (L) 39 - 52 %   MCV 105.6 (H) 80.0 - 100.0 fL   MCH 33.0 26.0 - 34.0 pg   MCHC 31.3 30.0 - 36.0 g/dL   RDW 13.3 11.5 - 15.5 %   Platelets 223 150 - 400 K/uL   nRBC 0.0 0.0 - 0.2 %    Comment: Performed at [facility], [address] [place], [place] [address]  Protime-INR     Status: None   Collection Time: 09/25/19  3:31 AM  Result Value Ref Range   Prothrombin Time 14.1 11.4 - 15.2 seconds   INR 1.1 0.8 - 1.2    Comment: (NOTE) INR goal varies based on device and disease states. Performed at [facility], [address], [place], [place] [address]   APTT     Status: Abnormal   Collection Time: 09/25/19  3:31 AM  Result Value Ref Range   aPTT 44 (H) 24 - 36 seconds    Comment:        IF BASELINE aPTT IS ELEVATED, SUGGEST PATIENT RISK ASSESSMENT BE USED TO DETERMINE APPROPRIATE ANTICOAGULANT THERAPY. Performed at [facility], [address], [place], [place] [address]   Magnesium     Status: None   Collection Time: 09/25/19  3:31 AM  Result Value Ref Range   Magnesium 2.0 1.7 - 2.4 mg/dL    Comment: Performed at [facility], [address], [place], [place] [address]  Phosphorus     Status: Abnormal   Collection Time: 09/25/19  3:31 AM  Result Value Ref Range   Phosphorus 5.7 (H) 2.5 - 4.6 mg/dL    Comment: Performed at [facility], [address] [place], [place] [address]  Vitamin B12     Status: None   Collection Time: 09/25/19  3:31 AM  Result Value Ref Range   Vitamin B-12 444 180 - 914 pg/mL    Comment: (NOTE) This assay is not validated for testing neonatal or myeloproliferative syndrome specimens for Vitamin B12  levels. Performed at [facility], [address], [place], [place] [address]   Folate     Status: None   Collection Time: 09/25/19  3:31 AM  Result Value Ref Range   Folate 10.7 >5.9 ng/mL    Comment: Performed at [facility], [address] [place], [place] [address]  Glucose, capillary     Status: None   Collection Time: 09/25/19 11:50 AM  Result Value Ref Range   Glucose-Capillary 85 70 - 99 mg/dL  Comment: Glucose reference range applies only to samples taken after fasting for at least 8 hours.  Glucose, capillary     Status: None   Collection Time: 09/25/19  4:53 PM  Result Value Ref Range   Glucose-Capillary 95 70 - 99 mg/dL    Comment: Glucose reference range applies only to samples taken after fasting for at least 8 hours.  Glucose, capillary     Status: Abnormal   Collection Time: 09/25/19  7:51 PM  Result Value Ref Range   Glucose-Capillary 122 (H) 70 - 99 mg/dL    Comment: Glucose reference range applies only to samples taken after fasting for at least 8 hours.  Glucose, capillary     Status: None   Collection Time: 09/26/19  7:56 AM  Result Value Ref Range   Glucose-Capillary 73 70 - 99 mg/dL    Comment: Glucose reference range applies only to samples taken after fasting for at least 8 hours.    CT HEAD WO CONTRAST  Result Date: 09/24/2019 CLINICAL DATA:  Stroke, follow up Left-sided pain and weakness onset this morning. EXAM: CT HEAD WITHOUT CONTRAST TECHNIQUE: Contiguous axial images were obtained from the base of the skull through the vertex without intravenous contrast. COMPARISON:  Head CT and brain MRI 08/02/2019 FINDINGS: Brain: Stable degree of atrophy and chronic small vessel ischemia. No intracranial hemorrhage, mass effect, or midline shift. No hydrocephalus. The basilar cisterns are patent. No evidence of territorial infarct or acute ischemia. No extra-axial or intracranial fluid collection. Vascular: Atherosclerosis of skullbase vasculature without  hyperdense vessel or abnormal calcification. Skull: No fracture or focal lesion. Sinuses/Orbits: Opacification of a single right mastoid air cell is new from prior exam. Mastoid air cells are otherwise clear. Paranasal sinuses are clear. Orbits are unremarkable. Other: None. IMPRESSION: 1. No acute intracranial abnormality. No hemorrhage or evidence of acute ischemia. 2. Stable atrophy and chronic small vessel ischemia. Electronically Signed   By: Keith Rake M.D.   On: 09/24/2019 21:54   MR BRAIN WO CONTRAST  Result Date: 09/25/2019 CLINICAL DATA:  Left-sided numbness EXAM: MRI HEAD WITHOUT CONTRAST TECHNIQUE: Multiplanar, multiecho pulse sequences of the brain and surrounding structures were obtained without intravenous contrast. COMPARISON:  08/02/2018 FINDINGS: Brain: There is no acute infarction or intracranial hemorrhage. There is no intracranial mass, mass effect, or edema. There is no hydrocephalus or extra-axial fluid collection. Patchy and confluent areas of T2 hyperintensity in the supratentorial white matter are nonspecific but probably reflect moderate chronic microvascular ischemic changes. There is ex vacuo dilatation of the lateral ventricles. Vascular: Major vessel flow voids at the skull base are preserved. Skull and upper cervical spine: Normal marrow signal is preserved. Sinuses/Orbits: Paranasal sinuses are aerated. Orbits are unremarkable. Other: Sella is unremarkable.  Mastoid air cells are clear. IMPRESSION: No evidence of recent infarction, hemorrhage, or mass. Moderate chronic microvascular ischemic changes, greater than expected for age. Electronically Signed   By: Macy Mis M.D.   On: 09/25/2019 11:20   MR CERVICAL SPINE WO CONTRAST  Result Date: 09/25/2019 CLINICAL DATA:  Left-sided numbness or tingling. EXAM: MRI CERVICAL SPINE WITHOUT CONTRAST TECHNIQUE: Multiplanar, multisequence MR imaging of the cervical spine was performed. No intravenous contrast was  administered. COMPARISON:  None. FINDINGS: Alignment: Straightening of the cervical spine. Vertebrae: No fracture, evidence of discitis, or bone lesion. Cord: Normal signal and morphology. Posterior Fossa, vertebral arteries, paraspinal tissues: Negative. Disc levels: C2-3: Unremarkable. C3-4: Unremarkable. C4-5: Unremarkable. C5-6: Disc narrowing with left paracentral shallow protrusion. Negative facets.  No neural compression C6-7: Greatest level of degenerative disc narrowing. Uncovertebral spurring and left-sided disc osteophyte complex causing high-grade foraminal impingement and mild left cord flattening. Negative facets. C7-T1:Unremarkable. IMPRESSION: C5-6 and C6-7 disc degeneration, more notable at C6-7 where there is prominent left uncovertebral and paracentral spurring causing foraminal impingement and mild left cord mass effect. No other level of left foraminal impingement. Electronically Signed   By: Monte Fantasia M.D.   On: 09/25/2019 07:05   MR LUMBAR SPINE WO CONTRAST  Result Date: 09/25/2019 CLINICAL DATA:  Left-sided numbness and pain. Left leg weakness upon awakening this morning EXAM: MRI LUMBAR SPINE WITHOUT CONTRAST TECHNIQUE: Multiplanar, multisequence MR imaging of the lumbar spine was performed. No intravenous contrast was administered. COMPARISON:  None. FINDINGS: Segmentation:  Standard lumbar numbering. Alignment: Grade 1 anterolisthesis at L5-S1 that is associated with a right-sided chronic pars defect at L5. Vertebrae: Schmorl's node at the L2 superior endplate. No acute fracture, discitis, or aggressive bone lesion. Conus medullaris and cauda equina: Conus extends to the L1-2 level. Conus and cauda equina appear normal. Paraspinal and other soft tissues: Negative Disc levels: L1-L2: Mild disc bulging. L2-L3: Mild disc bulging. L3-L4: Mild disc bulging. L4-L5: Disc bulging and left foraminal annular fissure with protrusion causing foraminal impingement when combined with mild facet  spurring. Disc bulging also narrows the right foramen to a milder degree. Widely patent canal. L5-S1:Right-sided L5 pars defect with anterolisthesis. Greatest level of degenerative disc narrowing with left eccentric far-lateral spurring. Bilateral foraminal narrowing which is somewhat prominent on sagittal images but noncompressive based on axial slices. Widely patent canal. IMPRESSION: 1. L4-5 left foraminal protrusion with moderate left L4 impingement. 2. 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 Noncompressive foraminal narrowing at this level on the left more than right. 3. Diffusely patent spinal canal. Electronically Signed   By: Monte Fantasia M.D.   On: 09/25/2019 07:09    ROS: Left side arm and leg numbness and pain Blood pressure 140/78, pulse 78, temperature 98.2 F (36.8 C), temperature source Oral, resp. rate 18, height 6' (1.829 m), weight 93.5 kg, SpO2 100 %. General appearance: alert, appears older than stated age and no distress Resp: clear to auscultation bilaterally Cardio: irregularly irregular rhythm GI: soft, non-tender; bowel sounds normal; no masses,  no organomegaly Extremities: edema trace to 1+-  excoriations left uppper arm AVG-  patent  Assessment/Plan: 49 year old white male with multiple medical issues including ESRD.  He presents with left-sided numbness and pain 1 left-sided numbness and pain-imaging studies not completely normal.  Neurology and neurosurgery have been involved.  Attempting a trial of steroids 2 ESRD: Normally Monday Wednesday Friday at [facility] via AV graft.  Unable to do routine dialysis yesterday due to patient volume.  Planning for HD today off schedule.  Then, depending on how long he needs to be in the hospital we will figure out the timing of his next treatment 3 Hypertension: Blood pressure fine only on low-dose Coreg.  Volume status just a little heavy.  Should correct with dialysis 4. Anemia of  ESRD: Receives EPO with every dialysis with [facility].  Last hemoglobin over 11.  Have not dosed ESA here 5. Metabolic Bone Disease: Continue home dose Hectorol.  Also issue with hypocalcemia-takes Os-Cal and Tums normally at home-this will be continued as well 6.  Hyperkalemia-apparently quite a problem for him as he runs on a 1 potassium bath and takes Veltassa 3 times weekly.  Potassium yesterday was fine.  Will check potassium predialysis today.  Steroids may impact  Louis Meckel 09/26/2019, 8:36 AM

## 2019-09-26 NOTE — Evaluation (Signed)
Occupational Therapy Evaluation Patient Details Name: Brendan Campbell MRN: [id number] DOB: 1970/05/25 Today's Date: 09/26/2019    History of Present Illness Brendan Campbell is a 49 y.o. male with medical history significant for  hypertension, hyperlipidemia, diabetes mellitus type 2, ESRD on hemodialysis, atrial fibrillation on anticoagulation with Eliquis, right BKA, diabetic neuropathy and hypocalcemia who presents to the  emergency department due to left arm and leg pain and numbness which started on awakening around 6 AM today.  Patient states " I feel like my left arm and leg were dead" and he complained of pain that starts from left leg and shoots upward to his left arm.  Patient states that he had a similar presentation in June during which stroke work-up was done and had negative MRI, echocardiogram and carotid ultrasound.  It was then thought to be due to low calcium level.  Patient states that he does have occasional sensation of numbness in extremities including right arm (though usually worse on the left) and this improves transitorily when he takes Tums, he started taking over-the-counter calcium pills which appeared to help as well but improvement was only short-term.  He denies fever, chills, nausea, vomiting, abdominal pain.   Clinical Impression   Pt agreeable to OT evaluation this am. Pt reports independence overnight in getting up and going to restroom, he is independent in donning his prosthesis. Pt demonstrating mild LUE weakness compared to RUE, coordination and sensation are intact. Pt is at baseline with ADL completion, no further OT needs at this time.     Follow Up Recommendations  No OT follow up    Equipment Recommendations  None recommended by OT       Precautions / Restrictions Precautions Precautions: Fall Precaution Comments: R BKA Required Braces or Orthoses: Other Brace (RLE prosthesis) Restrictions Weight Bearing Restrictions: No      Mobility Bed  Mobility Overal bed mobility: Modified Independent                Transfers                          ADL either performed or assessed with clinical judgement   ADL Overall ADL's : Modified independent;At baseline                                       General ADL Comments: Pt independent in ADLs overnight and this morning.      Vision Baseline Vision/History: No visual deficits Patient Visual Report: No change from baseline Vision Assessment?: No apparent visual deficits            Pertinent Vitals/Pain Pain Assessment: 0-10 Pain Score: 4  Pain Location: back Pain Descriptors / Indicators: Discomfort Pain Intervention(s): Limited activity within patient's tolerance;Monitored during session;Repositioned     Hand Dominance Right   Extremity/Trunk Assessment Upper Extremity Assessment Upper Extremity Assessment: LUE deficits/detail LUE Deficits / Details: LUE strength 4-/5 compared to RUE at 4+/5 LUE Sensation: WNL LUE Coordination: WNL   Lower Extremity Assessment Lower Extremity Assessment: Defer to PT evaluation   Cervical / Trunk Assessment Cervical / Trunk Assessment: Normal   Communication Communication Communication: No difficulties   Cognition Arousal/Alertness: Awake/alert Behavior During Therapy: WFL for tasks assessed/performed Overall Cognitive Status: Within Functional Limits for tasks assessed  Home Living Family/patient expects to be discharged to:: Private residence Living Arrangements: Non-relatives/Friends Available Help at Discharge: Friend(s) Type of Home: Mobile home Home Access: [place]: One level         Bathroom Toilet: [place] - single point;Wheelchair - manual;Shower seat          Prior Functioning/Environment Level of Independence: Independent with assistive device(s)         Comments: Patient statec community ambulation with SPC and RLE Prosthesis; independent in ADLs        OT Problem List: Decreased activity tolerance       End of Session    Activity Tolerance: Patient tolerated treatment well Patient left: in bed;with call bell/phone within reach  OT Visit Diagnosis: Muscle weakness (generalized) (M62.81)                Time: 3128-1188 OT Time Calculation (min): 10 min Charges:  OT General Charges $OT Visit: 1 Visit OT Evaluation $OT Eval Low Complexity: [place], OTR/L  [phone] 09/26/2019, 8:23 AM

## 2019-09-26 NOTE — Progress Notes (Signed)
EEG Completed; Results Pending  

## 2019-09-26 NOTE — Progress Notes (Signed)
PROGRESS NOTE    Brendan Campbell  LEX:[id number] DOB: 1970-05-27 DOA: 09/24/2019 PCP: Monico Blitz, MD    Brief Narrative:  HPI: Brendan Campbell is a 49 y.o. male with medical history significant for hypertension, hyperlipidemia, diabetes mellitus type 2, ESRD on hemodialysis, atrial fibrillation on anticoagulation with Eliquis, right BKA, diabetic neuropathy and hypocalcemia who presents to the  emergency department due to left arm and leg pain and numbness which started on awakening around 6 AM today.  Patient states " I feel like my left arm and leg were dead" and he complained of pain that starts from left leg and shoots upward to his left arm.  Patient states that he had a similar presentation in June during which stroke work-up was done and had negative MRI, echocardiogram and carotid ultrasound.  It was then thought to be due to low calcium level.  Patient states that he does have occasional sensation of numbness in extremities including right arm (though usually worse on the left) and this improves transitorily when he takes Tums, he started taking over-the-counter calcium pills which appeared to help as well but improvement was only short-term.  He denies fever, chills, nausea, vomiting, abdominal pain.  ED Course:  In the emergency department, he was hemodynamically stable.  Work-up in ED showed macrocytic anemia, hyperkalemia, BSL creatinine 41/8.15.  SARS coronavirus 2 was negative.  CT head without contrast showed no acute intracranial abnormality. No hemorrhage or evidence of acute ischemia. Teleneurologist was consulted and recommended MRI of cervical and lumbar spine.  Hospitalist was asked to admit patient for further evaluation and management.   Assessment & Plan:   Active Problems:   Type 2 diabetes with nephropathy (HCC)   HLD (hyperlipidemia)   S/P below knee amputation, right (HCC)   Hyperkalemia   ESRD on hemodialysis (HCC)   Left sided numbness   Hypocalcemia    Paresthesia of skin   Macrocytic anemia   1. Left-sided weakness and numbness, possibly related to cervical myelopathy.  Discussed case with neurosurgery, Dr. Ronnald Ramp and it was felt that patient's clinical findings would not be explained simply by findings on MRI C-spine.  The degree of cord compression needed to cause upper and lower extremity weakness would be more than what is found on patient's MRI.  Recommendations were to look for other causes of patient's symptoms.  MRI brain did not show any acute infarct.  Seen by neurology and it was felt that she has some potential compression on the exiting nerve root at C6-C7.  If felt unlikely to explain the entire picture.  It was felt reasonable to give a trial of intravenous steroids.  He was started on Solu-Medrol.  EEG also ordered.. 2. Hypocalcemia.  Continue oral supplementation. 3. End-stage renal disease on hemodialysis Monday, Wednesday, Friday.  Last dialysis session was on 8/16.  Nephrology consulted.  Plans for hemodialysis treatment today 4. Diabetes.  Chronically on Lantus 10 units at bedtime.  Continue sliding scale insulin.  Blood sugars currently stable. 5. Atrial fibrillation.  Currently on Coreg and apixaban 6. Hyperlipidemia.  Continue statin. 7. Macrocytic anemia.  Folate and B12 in normal range.  Continue to follow.   DVT prophylaxis: SCDs Start: 09/24/19 2321 apixaban (ELIQUIS) tablet 5 mg  Code Status: full code Family Communication: discussed with patient Disposition Plan: Status is: Inpatient  The patient remains OBS appropriate and will d/c before 2 midnights.  Dispo: The patient is from: Home  Anticipated d/c is to: Home              Anticipated d/c date is: 1 day              Patient currently is not medically stable to d/c.   Consultants:   Nephrology  Neurosurgery (phone)   Neurology  Procedures:     Antimicrobials:       Subjective: Does not notice any significant change from  yesterday.  Reports receiving dose of steroids this morning.  Objective: Vitals:   09/26/19 1930 09/26/19 2000 09/26/19 2015 09/26/19 2030  BP: 135/85 110/70 122/78 96/74  Pulse: 94 88 90 90  Resp:      Temp:      TempSrc:      SpO2:      Weight:      Height:        Intake/Output Summary (Last 24 hours) at 09/26/2019 2051 Last data filed at 09/26/2019 1700 Gross per 24 hour  Intake 49.07 ml  Output --  Net 49.07 ml   Filed Weights   09/24/19 2008 09/25/19 1100 09/26/19 1700  Weight: 98.4 kg 93.5 kg 98.4 kg    Examination:  General exam: Alert, awake, oriented x 3 Respiratory system: Clear to auscultation. Respiratory effort normal. Cardiovascular system:RRR. No murmurs, rubs, gallops. Gastrointestinal system: Abdomen is nondistended, soft and nontender. No organomegaly or masses felt. Normal bowel sounds heard. Central nervous system: Alert and oriented. No focal neurological deficits. Extremities: Right below-knee amputation Skin: No rashes, lesions or ulcers Psychiatry: Judgement and insight appear normal. Mood & affect appropriate.      Data Reviewed: I have personally reviewed following labs and imaging studies  CBC: Recent Labs  Lab 09/24/19 2043 09/25/19 0331  WBC 7.1 7.3  NEUTROABS 4.4  --   HGB 11.4* 11.2*  HCT 36.4* 35.8*  MCV 106.7* 105.6*  PLT 228 433   Basic Metabolic Panel: Recent Labs  Lab 09/24/19 2043 09/25/19 0331  NA 137 137  K 5.4* 4.7  CL 93* 97*  CO2 30 28  GLUCOSE 123* 118*  BUN 41* 45*  CREATININE 8.15* 8.52*  CALCIUM 7.5* 7.7*  MG  --  2.0  PHOS  --  5.7*   GFR: Estimated Creatinine Clearance: 12.9 mL/min (A) (by C-G formula based on SCr of 8.52 mg/dL (H)). Liver Function Tests: Recent Labs  Lab 09/24/19 2043 09/25/19 0331  AST 18 16  ALT 17 17  ALKPHOS 115 113  BILITOT 0.4 0.5  PROT 7.7 6.7  ALBUMIN 3.8 3.4*   No results for input(s): LIPASE, AMYLASE in the last 168 hours. No results for input(s): AMMONIA in  the last 168 hours. Coagulation Profile: Recent Labs  Lab 09/24/19 2043 09/25/19 0331  INR 1.1 1.1   Cardiac Enzymes: No results for input(s): CKTOTAL, CKMB, CKMBINDEX, TROPONINI in the last 168 hours. BNP (last 3 results) No results for input(s): PROBNP in the last 8760 hours. HbA1C: No results for input(s): HGBA1C in the last 72 hours. CBG: Recent Labs  Lab 09/25/19 1653 09/25/19 1951 09/26/19 0756 09/26/19 1159 09/26/19 1708  GLUCAP 95 122* 73 77 86   Lipid Profile: No results for input(s): CHOL, HDL, LDLCALC, TRIG, CHOLHDL, LDLDIRECT in the last 72 hours. Thyroid Function Tests: No results for input(s): TSH, T4TOTAL, FREET4, T3FREE, THYROIDAB in the last 72 hours. Anemia Panel: Recent Labs    09/25/19 0331  VITAMINB12 444  FOLATE 10.7   Sepsis Labs: No results for input(s): PROCALCITON, LATICACIDVEN  in the last 168 hours.  Recent Results (from the past 240 hour(s))  SARS Coronavirus 2 by RT PCR (hospital order, performed in [facility] hospital lab) Nasopharyngeal Nasopharyngeal Swab     Status: None   Collection Time: 09/24/19  9:30 PM   Specimen: Nasopharyngeal Swab  Result Value Ref Range Status   SARS Coronavirus 2 NEGATIVE NEGATIVE Final    Comment: (NOTE) SARS-CoV-2 target nucleic acids are NOT DETECTED.  The SARS-CoV-2 RNA is generally detectable in upper and lower respiratory specimens during the acute phase of infection. The lowest concentration of SARS-CoV-2 viral copies this assay can detect is 250 copies / mL. A negative result does not preclude SARS-CoV-2 infection and should not be used as the sole basis for treatment or other patient management decisions.  A negative result may occur with improper specimen collection / handling, submission of specimen other than nasopharyngeal swab, presence of viral mutation(s) within the areas targeted by this assay, and inadequate number of viral copies (<250 copies / mL). A negative result must be  combined with clinical observations, patient history, and epidemiological information.  Fact Sheet for Patients:   StrictlyIdeas.no  Fact Sheet for Healthcare Providers: BankingDealers.co.za  This test is not yet approved or  cleared by the [place] FDA and has been authorized for detection and/or diagnosis of SARS-CoV-2 by FDA under an Emergency Use Authorization (EUA).  This EUA will remain in effect (meaning this test can be used) for the duration of the COVID-19 declaration under Section 564(b)(1) of the Act, 21 U.S.C. section 360bbb-3(b)(1), unless the authorization is terminated or revoked sooner.  Performed at [facility], [address] [place], [place] [address]          Radiology Studies: CT HEAD WO CONTRAST  Result Date: 09/24/2019 CLINICAL DATA:  Stroke, follow up Left-sided pain and weakness onset this morning. EXAM: CT HEAD WITHOUT CONTRAST TECHNIQUE: Contiguous axial images were obtained from the base of the skull through the vertex without intravenous contrast. COMPARISON:  Head CT and brain MRI 08/02/2019 FINDINGS: Brain: Stable degree of atrophy and chronic small vessel ischemia. No intracranial hemorrhage, mass effect, or midline shift. No hydrocephalus. The basilar cisterns are patent. No evidence of territorial infarct or acute ischemia. No extra-axial or intracranial fluid collection. Vascular: Atherosclerosis of skullbase vasculature without hyperdense vessel or abnormal calcification. Skull: No fracture or focal lesion. Sinuses/Orbits: Opacification of a single right mastoid air cell is new from prior exam. Mastoid air cells are otherwise clear. Paranasal sinuses are clear. Orbits are unremarkable. Other: None. IMPRESSION: 1. No acute intracranial abnormality. No hemorrhage or evidence of acute ischemia. 2. Stable atrophy and chronic small vessel ischemia. Electronically Signed   By: Keith Rake M.D.   On:  09/24/2019 21:54   MR BRAIN WO CONTRAST  Result Date: 09/25/2019 CLINICAL DATA:  Left-sided numbness EXAM: MRI HEAD WITHOUT CONTRAST TECHNIQUE: Multiplanar, multiecho pulse sequences of the brain and surrounding structures were obtained without intravenous contrast. COMPARISON:  08/02/2018 FINDINGS: Brain: There is no acute infarction or intracranial hemorrhage. There is no intracranial mass, mass effect, or edema. There is no hydrocephalus or extra-axial fluid collection. Patchy and confluent areas of T2 hyperintensity in the supratentorial white matter are nonspecific but probably reflect moderate chronic microvascular ischemic changes. There is ex vacuo dilatation of the lateral ventricles. Vascular: Major vessel flow voids at the skull base are preserved. Skull and upper cervical spine: Normal marrow signal is preserved. Sinuses/Orbits: Paranasal sinuses are aerated. Orbits are unremarkable. Other:  Sella is unremarkable.  Mastoid air cells are clear. IMPRESSION: No evidence of recent infarction, hemorrhage, or mass. Moderate chronic microvascular ischemic changes, greater than expected for age. Electronically Signed   By: Macy Mis M.D.   On: 09/25/2019 11:20   MR CERVICAL SPINE WO CONTRAST  Result Date: 09/25/2019 CLINICAL DATA:  Left-sided numbness or tingling. EXAM: MRI CERVICAL SPINE WITHOUT CONTRAST TECHNIQUE: Multiplanar, multisequence MR imaging of the cervical spine was performed. No intravenous contrast was administered. COMPARISON:  None. FINDINGS: Alignment: Straightening of the cervical spine. Vertebrae: No fracture, evidence of discitis, or bone lesion. Cord: Normal signal and morphology. Posterior Fossa, vertebral arteries, paraspinal tissues: Negative. Disc levels: C2-3: Unremarkable. C3-4: Unremarkable. C4-5: Unremarkable. C5-6: Disc narrowing with left paracentral shallow protrusion. Negative facets. No neural compression C6-7: Greatest level of degenerative disc narrowing.  Uncovertebral spurring and left-sided disc osteophyte complex causing high-grade foraminal impingement and mild left cord flattening. Negative facets. C7-T1:Unremarkable. IMPRESSION: C5-6 and C6-7 disc degeneration, more notable at C6-7 where there is prominent left uncovertebral and paracentral spurring causing foraminal impingement and mild left cord mass effect. No other level of left foraminal impingement. Electronically Signed   By: Monte Fantasia M.D.   On: 09/25/2019 07:05   MR LUMBAR SPINE WO CONTRAST  Result Date: 09/25/2019 CLINICAL DATA:  Left-sided numbness and pain. Left leg weakness upon awakening this morning EXAM: MRI LUMBAR SPINE WITHOUT CONTRAST TECHNIQUE: Multiplanar, multisequence MR imaging of the lumbar spine was performed. No intravenous contrast was administered. COMPARISON:  None. FINDINGS: Segmentation:  Standard lumbar numbering. Alignment: Grade 1 anterolisthesis at L5-S1 that is associated with a right-sided chronic pars defect at L5. Vertebrae: Schmorl's node at the L2 superior endplate. No acute fracture, discitis, or aggressive bone lesion. Conus medullaris and cauda equina: Conus extends to the L1-2 level. Conus and cauda equina appear normal. Paraspinal and other soft tissues: Negative Disc levels: L1-L2: Mild disc bulging. L2-L3: Mild disc bulging. L3-L4: Mild disc bulging. L4-L5: Disc bulging and left foraminal annular fissure with protrusion causing foraminal impingement when combined with mild facet spurring. Disc bulging also narrows the right foramen to a milder degree. Widely patent canal. L5-S1:Right-sided L5 pars defect with anterolisthesis. Greatest level of degenerative disc narrowing with left eccentric far-lateral spurring. Bilateral foraminal narrowing which is somewhat prominent on sagittal images but noncompressive based on axial slices. Widely patent canal. IMPRESSION: 1. L4-5 left foraminal protrusion with moderate left L4 impingement. 2. L5 chronic  right-sided pars defect with L5-S1 anterolisthesis and disc degeneration. Noncompressive foraminal narrowing at this level on the left more than right. 3. Diffusely patent spinal canal. Electronically Signed   By: Monte Fantasia M.D.   On: 09/25/2019 07:09        Scheduled Meds: . apixaban  5 mg Oral BID  . atorvastatin  10 mg Oral QHS  . calcium carbonate  1 tablet Oral Q breakfast  . calcium carbonate  800 mg of elemental calcium Oral BID  . carvedilol  3.125 mg Oral BID WC  . Chlorhexidine Gluconate Cloth  6 each Topical Q0600  . doxercalciferol  1.5 mcg Intravenous Q T,Th,Sa-HD  . insulin aspart  0-5 Units Subcutaneous QHS  . insulin aspart  0-9 Units Subcutaneous TID WC  . insulin glargine  10 Units Subcutaneous QHS  . multivitamin  1 tablet Oral Daily  . patiromer  8.4 g Oral Once per day on Mon Wed Fri   Continuous Infusions: . sodium chloride    . sodium chloride    .  methylPREDNISolone (SOLU-MEDROL) injection Stopped (09/26/19 1308)     LOS: 1 day    Time spent: 24mins    Kathie Dike, MD [facility]   If 7PM-7AM, please contact night-coverage [email or web address]  09/26/2019, 8:51 PM

## 2019-09-26 NOTE — Procedures (Signed)
HEMODIALYSIS TREATMENT NOTE:   4 hour low-heparin HD completed via LUA AVG (16g/antegrade).  Appreciative of cannulation assistance from Veronica Thompson, RN.  Goal met: 2.7 liters removed without interruption in UF.  All blood was returned and hemostasis was achieved in 15 minutes.  No changes from pre-HD assessment.      , RN 

## 2019-09-27 DIAGNOSIS — I739 Peripheral vascular disease, unspecified: Secondary | ICD-10-CM

## 2019-09-27 LAB — RENAL FUNCTION PANEL
Albumin: 3.7 g/dL (ref 3.5–5.0)
Anion gap: 17 — ABNORMAL HIGH (ref 5–15)
BUN: 45 mg/dL — ABNORMAL HIGH (ref 6–20)
CO2: 27 mmol/L (ref 22–32)
Calcium: 8.3 mg/dL — ABNORMAL LOW (ref 8.9–10.3)
Chloride: 90 mmol/L — ABNORMAL LOW (ref 98–111)
Creatinine, Ser: 7.35 mg/dL — ABNORMAL HIGH (ref 0.61–1.24)
GFR calc Af Amer: 9 mL/min — ABNORMAL LOW (ref >60)
GFR calc non Af Amer: 8 mL/min — ABNORMAL LOW (ref >60)
Glucose, Bld: 208 mg/dL — ABNORMAL HIGH (ref 70–99)
Phosphorus: 4.9 mg/dL — ABNORMAL HIGH (ref 2.5–4.6)
Potassium: 4.2 mmol/L (ref 3.5–5.1)
Sodium: 134 mmol/L — ABNORMAL LOW (ref 135–145)

## 2019-09-27 LAB — GLUCOSE, CAPILLARY
Glucose-Capillary: 218 mg/dL — ABNORMAL HIGH (ref 70–99)
Glucose-Capillary: 228 mg/dL — ABNORMAL HIGH (ref 70–99)

## 2019-09-27 MED ORDER — CHLORHEXIDINE GLUCONATE CLOTH 2 % EX PADS: 6.0000 | MEDICATED_PAD | Freq: Every day | CUTANEOUS | Status: DC

## 2019-09-27 NOTE — Progress Notes (Signed)
[facility] Progress Note  Name: VERLON PISCHKE MRN: [id number] DOB: 12/17/1970  Chief Complaint:  Acute left sided numbness and weakness   Subjective:  Had HD on 8/19 off schedule due to patient volumes (he Had 2.7 liters UF).  Hasn't heard about when he might go home.  He states he's able to set up HD in [place] tomorrow if there's an issue getting it today due to transportation.    Review of systems:  Denies shortness of breath or chest pain Denies n/v Feels strength is a bit better ----------  Background on consult:  KINDRICK LANKFORD is an 49 y.o. male with past medical history significant for hypertension, type 2 diabetes mellitus, hyperlipidemia, atrial fibrillation on Eliquis, depressed ejection fraction, severe diabetic neuropathy with right BKA.  He also has ESRD, dialysis Monday Wednesday Friday at [facility].  Many hospitalizations and trips to the emergency department.  He presented to the [facility] emergency department on 8/17 with acute onset left-sided numbness and weakness.  Work-up has been done including MRI of brain, C-spine and T-spine.  Neurosurgery and neurology have been involved.  The MRI of the C-spine indicated possible nerve compression.  Neurology suggested steroids to see if symptoms would improve.  They have also ordered an EEG.  Yesterday would have been his dialysis day but labs did not indicate dialysis need.  Due to patient volume yesterday he was unable to be done.  However, dialysis is planned today off schedule.  He reports same sxms this AM   Intake/Output Summary (Last 24 hours) at 09/27/2019 0834 Last data filed at 09/26/2019 2120 Gross per 24 hour  Intake 49.07 ml  Output 2738 ml  Net -2688.93 ml    Vitals:  Vitals:   09/26/19 2120 09/26/19 2130 09/27/19 0557 09/27/19 0831  BP: 109/70 116/71 105/69 125/81  Pulse: 90 88 81 72  Resp:  16 18   Temp:  98.1 F (36.7 C) 98.1 F (36.7 C)   TempSrc:  Oral    SpO2:   96%   Weight:   95.6 kg    Height:         Physical Exam:  General adult male in bed in no acute distress HEENT normocephalic atraumatic extraocular movements intact sclera anicteric Neck supple trachea midline Lungs clear to auscultation bilaterally normal work of breathing at rest  Heart regular rate and rhythm no rubs or gallops appreciated Abdomen soft nontender nondistended Extremities no edema lower leg and residual limb Psych normal mood and affect Graft LUE with bruit/thrill  Medications reviewed   Labs:  BMP Latest Ref Rng & Units 09/25/2019 09/24/2019 08/09/2019  Glucose 70 - 99 mg/dL 118(H) 123(H) 108(H)  BUN 6 - 20 mg/dL 45(H) 41(H) 41(H)  Creatinine 0.61 - 1.24 mg/dL 8.52(H) 8.15(H) 7.09(H)  BUN/Creat Ratio 9 - 20 - - -  Sodium 135 - 145 mmol/L 137 137 135  Potassium 3.5 - 5.1 mmol/L 4.7 5.4(H) 4.1  Chloride 98 - 111 mmol/L 97(L) 93(L) 94(L)  CO2 22 - 32 mmol/L 28 30 28   Calcium 8.9 - 10.3 mg/dL 7.7(L) 7.5(L) 7.5(L)   Outpatient regimen:   HD at [facility] MWF via AVG- More compliant of late  4 hours 15 min- L AVG- 300/600 1 K /2.5 calc bath, EDW 93 Gets heparin with HD- Also 800 units epo and 1.5 of hectorol and 50 of venofer weekly   Assessment/Plan:   49 year old white male with multiple medical issues including ESRD.  He presented  with left-sided numbness and pain  1 left-sided numbness and pain- s/p Neurology and neurosurgery consult.  note attempting a trial of steroids.   2 ESRD: Normally Monday Wednesday Friday at [facility] via AV graft.  HD on 8/19 off schedule due to patient volumes and acuity.  Renal panel today.  HD today to get back on MWF schedule - if any issue getting it today he states he can call unit to get a treatment tomorrow if he is discharged today  3 Hypertension: controlled on current regimen  4. Anemia of ESRD: Receives EPO with every dialysis with [facility].  Last hemoglobin over 11.  Have not dosed ESA here  5. Metabolic Bone Disease: Continue  home dose Hectorol.  Also issue with hypocalcemia-takes Os-Cal and Tums normally at home-this will be continued as well  6.  Hyperkalemia-apparently quite a problem for him as he runs on a 1 potassium bath outpatient as above and takes Veltassa 3 times weekly. update labs today   Claudia Desanctis, MD 09/27/2019 8:49 AM

## 2019-09-27 NOTE — Progress Notes (Signed)
   NEPHROLOGY NURSING NOTE:   Pt elects to dialyze tomorrow at outpatient center.  Confirmed chair time of 1330 with Horris Latino, charge nurse - [place].  Transportation has been arranged.  Stable to discharge home today per Dr. [name].  Rockwell Alexandria, RN

## 2019-09-27 NOTE — Discharge Summary (Addendum)
Physician Discharge Summary  [name]:[id number] DOB: 02-08-1971 DOA: 09/24/2019  PCP: Monico Blitz, MD  Admit date: 09/24/2019 Discharge date: 09/27/2019  Admitted From: Home Disposition: Home  Recommendations for Outpatient Follow-up:  Follow up with PCP in 1-2 weeks Please obtain BMP/CBC in one week Follow-up at dialysis center tomorrow for regular dialysis Follow-up with neurology in the next 1 month  Discharge Condition: Stable CODE STATUS: Full code Diet recommendation: Heart healthy  Brief/Interim Summary: HPI: Brendan Campbell is a 49 y.o. male with medical history significant for  hypertension, hyperlipidemia, diabetes mellitus type 2, ESRD on hemodialysis, atrial fibrillation on anticoagulation with Eliquis, right BKA, diabetic neuropathy and hypocalcemia who presents to the  emergency department due to left arm and leg pain and numbness which started on awakening around 6 AM today.  Patient states " I feel like my left arm and leg were dead" and he complained of pain that starts from left leg and shoots upward to his left arm.  Patient states that he had a similar presentation in June during which stroke work-up was done and had negative MRI, echocardiogram and carotid ultrasound.  It was then thought to be due to low calcium level.  Patient states that he does have occasional sensation of numbness in extremities including right arm (though usually worse on the left) and this improves transitorily when he takes Tums, he started taking over-the-counter calcium pills which appeared to help as well but improvement was only short-term.  He denies fever, chills, nausea, vomiting, abdominal pain.   ED Course:  In the emergency department, he was hemodynamically stable.  Work-up in ED showed macrocytic anemia, hyperkalemia, BSL creatinine 41/8.15.  SARS coronavirus 2 was negative.  CT head without contrast showed no acute intracranial abnormality. No hemorrhage or evidence of acute  ischemia. Teleneurologist was consulted and recommended MRI of cervical and lumbar spine.  Hospitalist was asked to admit patient for further evaluation and management.  Discharge Diagnoses:  Active Problems:   Type 2 diabetes with nephropathy (HCC)   HLD (hyperlipidemia)   S/P below knee amputation, right (HCC)   Hyperkalemia   ESRD on hemodialysis (HCC)   Left sided numbness   Hypocalcemia   Paresthesia of skin   Macrocytic anemia  Left-sided weakness and numbness, possibly related to cervical myelopathy.  Discussed case with neurosurgery, Dr. Ronnald Ramp and it was felt that patient's clinical findings would not be explained simply by findings on MRI C-spine.  The degree of cord compression needed to cause upper and lower extremity weakness would be more than what is found on patient's MRI.  Recommendations were to look for other causes of patient's symptoms.  MRI brain did not show any acute infarct.  Seen by neurology and it was felt that she has some potential compression on the exiting nerve root at C6-C7.  If felt unlikely to explain the entire picture.  EEG was ordered and found to be unremarkable.  He received a trial of high-dose Solu-Medrol with improvement of his symptoms.  Discussed with neurology and we will not prescribe any further steroids at this point.  He will follow-up with neurology in the next 1 month for further work-up.[name] Hypocalcemia.  Continue oral supplementation. End-stage renal disease on hemodialysis Monday, Wednesday, Friday.    Patient was followed by nephrology in the hospital for dialysis needs Diabetes.  Chronically on Lantus 10 units at bedtime.  Continue sliding scale insulin.  Blood sugars currently stable. Atrial fibrillation.  Currently on Coreg and  apixaban Hyperlipidemia.  Continue statin. Macrocytic anemia.  Folate and B12 in normal range.  Continue to follow. Chronic combined CHF. EF 25% with grade 1 diastolic dysfunction. Compensated.   Discharge  Instructions  Discharge Instructions     Ambulate with assistance   Complete by: As directed    Ambulatory referral to Physical Therapy   Complete by: As directed    Diet - low sodium heart healthy   Complete by: As directed    Increase activity slowly   Complete by: As directed       Allergies as of 09/27/2019       Reactions   Tape Other (See Comments)   Pulls skin off        Medication List     TAKE these medications    apixaban 5 MG Tabs tablet Commonly known as: ELIQUIS Take 1 tablet (5 mg total) by mouth 2 (two) times daily.   aspirin 81 MG chewable tablet Chew 81 mg by mouth daily.   atorvastatin 10 MG tablet Commonly known as: LIPITOR Take 1 tablet (10 mg total) by mouth every evening.   calcium acetate 667 MG capsule Commonly known as: PHOSLO Take 1 capsule (667 mg total) by mouth 3 (three) times daily with meals.   calcium carbonate 500 MG chewable tablet Commonly known as: TUMS - dosed in mg elemental calcium Chew 4 tablets (800 mg of elemental calcium total) by mouth 2 (two) times daily.   carvedilol 3.125 MG tablet Commonly known as: Coreg Take 1 tablet (3.125 mg total) by mouth 2 (two) times daily with a meal.   Lantus SoloStar 100 UNIT/ML Solostar Pen Generic drug: insulin glargine Inject 10 Units into the skin at bedtime.   lidocaine-prilocaine cream Commonly known as: EMLA Apply 1 application topically See admin instructions. Applied three times weekly at dialysis on MWF   multivitamin Tabs tablet Take 1 tablet by mouth daily.   Veltassa 8.4 g packet Generic drug: patiromer Take 1 packet (8.4 g total) by mouth 3 (three) times a week. On Tuesday, Thursday and Saturday        Follow-up Information     Phillips Odor, MD. Schedule an appointment as soon as possible for a visit in 1 month(s).   Specialty: Neurology Contact information: [address] [place] [address] [phone]         [facility] Follow up.   Specialty: Rehabilitation Why: OPPT Contact information: [place] [id number] [place] [address] [phone]               Allergies  Allergen Reactions   Tape Other (See Comments)    Pulls skin off    Consultations: Nephrology Neurology Neurosurgery (phone)   Procedures/Studies: CT HEAD WO CONTRAST  Result Date: 09/24/2019 CLINICAL DATA:  Stroke, follow up Left-sided pain and weakness onset this morning. EXAM: CT HEAD WITHOUT CONTRAST TECHNIQUE: Contiguous axial images were obtained from the base of the skull through the vertex without intravenous contrast. COMPARISON:  Head CT and brain MRI 08/02/2019 FINDINGS: Brain: Stable degree of atrophy and chronic small vessel ischemia. No intracranial hemorrhage, mass effect, or midline shift. No hydrocephalus. The basilar cisterns are patent. No evidence of territorial infarct or acute ischemia. No extra-axial or intracranial fluid collection. Vascular: Atherosclerosis of skullbase vasculature without hyperdense vessel or abnormal calcification. Skull: No fracture or focal lesion. Sinuses/Orbits: Opacification of a single right mastoid air cell is new from prior exam. Mastoid  air cells are otherwise clear. Paranasal sinuses are clear. Orbits are unremarkable. Other: None. IMPRESSION: 1. No acute intracranial abnormality. No hemorrhage or evidence of acute ischemia. 2. Stable atrophy and chronic small vessel ischemia. Electronically Signed   By: Keith Rake M.D.   On: 09/24/2019 21:54   MR BRAIN WO CONTRAST  Result Date: 09/25/2019 CLINICAL DATA:  Left-sided numbness EXAM: MRI HEAD WITHOUT CONTRAST TECHNIQUE: Multiplanar, multiecho pulse sequences of the brain and surrounding structures were obtained without intravenous contrast. COMPARISON:  08/02/2018 FINDINGS: Brain: There is no acute infarction or intracranial hemorrhage. There is no intracranial mass, mass  effect, or edema. There is no hydrocephalus or extra-axial fluid collection. Patchy and confluent areas of T2 hyperintensity in the supratentorial white matter are nonspecific but probably reflect moderate chronic microvascular ischemic changes. There is ex vacuo dilatation of the lateral ventricles. Vascular: Major vessel flow voids at the skull base are preserved. Skull and upper cervical spine: Normal marrow signal is preserved. Sinuses/Orbits: Paranasal sinuses are aerated. Orbits are unremarkable. Other: Sella is unremarkable.  Mastoid air cells are clear. IMPRESSION: No evidence of recent infarction, hemorrhage, or mass. Moderate chronic microvascular ischemic changes, greater than expected for age. Electronically Signed   By: Macy Mis M.D.   On: 09/25/2019 11:20   MR CERVICAL SPINE WO CONTRAST  Result Date: 09/25/2019 CLINICAL DATA:  Left-sided numbness or tingling. EXAM: MRI CERVICAL SPINE WITHOUT CONTRAST TECHNIQUE: Multiplanar, multisequence MR imaging of the cervical spine was performed. No intravenous contrast was administered. COMPARISON:  None. FINDINGS: Alignment: Straightening of the cervical spine. Vertebrae: No fracture, evidence of discitis, or bone lesion. Cord: Normal signal and morphology. Posterior Fossa, vertebral arteries, paraspinal tissues: Negative. Disc levels: C2-3: Unremarkable. C3-4: Unremarkable. C4-5: Unremarkable. C5-6: Disc narrowing with left paracentral shallow protrusion. Negative facets. No neural compression C6-7: Greatest level of degenerative disc narrowing. Uncovertebral spurring and left-sided disc osteophyte complex causing high-grade foraminal impingement and mild left cord flattening. Negative facets. C7-T1:Unremarkable. IMPRESSION: C5-6 and C6-7 disc degeneration, more notable at C6-7 where there is prominent left uncovertebral and paracentral spurring causing foraminal impingement and mild left cord mass effect. No other level of left foraminal impingement.  Electronically Signed   By: Monte Fantasia M.D.   On: 09/25/2019 07:05   MR LUMBAR SPINE WO CONTRAST  Result Date: 09/25/2019 CLINICAL DATA:  Left-sided numbness and pain. Left leg weakness upon awakening this morning EXAM: MRI LUMBAR SPINE WITHOUT CONTRAST TECHNIQUE: Multiplanar, multisequence MR imaging of the lumbar spine was performed. No intravenous contrast was administered. COMPARISON:  None. FINDINGS: Segmentation:  Standard lumbar numbering. Alignment: Grade 1 anterolisthesis at L5-S1 that is associated with a right-sided chronic pars defect at L5. Vertebrae: Schmorl's node at the L2 superior endplate. No acute fracture, discitis, or aggressive bone lesion. Conus medullaris and cauda equina: Conus extends to the L1-2 level. Conus and cauda equina appear normal. Paraspinal and other soft tissues: Negative Disc levels: L1-L2: Mild disc bulging. L2-L3: Mild disc bulging. L3-L4: Mild disc bulging. L4-L5: Disc bulging and left foraminal annular fissure with protrusion causing foraminal impingement when combined with mild facet spurring. Disc bulging also narrows the right foramen to a milder degree. Widely patent canal. L5-S1:Right-sided L5 pars defect with anterolisthesis. Greatest level of degenerative disc narrowing with left eccentric far-lateral spurring. Bilateral foraminal narrowing which is somewhat prominent on sagittal images but noncompressive based on axial slices. Widely patent canal. IMPRESSION: 1. L4-5 left foraminal protrusion with moderate left L4 impingement. 2. L5 chronic right-sided pars defect with  L5-S1 anterolisthesis and disc degeneration. Noncompressive foraminal narrowing at this level on the left more than right. 3. Diffusely patent spinal canal. Electronically Signed   By: Monte Fantasia M.D.   On: 09/25/2019 07:09   EEG adult  Result Date: 09/27/2019 Phillips Odor, MD     09/27/2019 11:30 AM Mitchell A. [name], MD     [email or web address]        HISTORY: This is a 49 year old presents with recurrent spells of left-sided weakness suspicious for focal seizures. MEDICATIONS: Scheduled Meds:  apixaban  5 mg Oral BID  atorvastatin  10 mg Oral QHS  calcium carbonate  1 tablet Oral Q breakfast  calcium carbonate  800 mg of elemental calcium Oral BID  carvedilol  3.125 mg Oral BID WC  Chlorhexidine Gluconate Cloth  6 each Topical Q0600  Chlorhexidine Gluconate Cloth  6 each Topical Q0600  doxercalciferol  1.5 mcg Intravenous Q T,Th,Sa-HD  insulin aspart  0-5 Units Subcutaneous QHS  insulin aspart  0-9 Units Subcutaneous TID WC  insulin glargine  10 Units Subcutaneous QHS  multivitamin  1 tablet Oral Daily  patiromer  8.4 g Oral Once per day on Mon Wed Fri Continuous Infusions:  sodium chloride    sodium chloride    methylPREDNISolone (SOLU-MEDROL) injection Stopped (09/26/19 1308) PRN Meds:.sodium chloride, sodium chloride, heparin, lidocaine (PF), lidocaine-prilocaine, pentafluoroprop-tetrafluoroeth ANALYSIS: A 16 channel recording using standard 10 20 measurements is conducted for 28 minutes.  There is a well-formed posterior dominant rhythm a of 28 hertz which attenuates with eye opening. There is beta activity observed in the frontal areas. Awake and drowsy architecture is observed which transitions to stage II non-REM sleep with prominent K complexes and sleep spindles seen throughout most of the recording. There is normal myogenic interference noted. Photic stimulation is carried out without abnormal changes in the background activity. There is no focal or lateral slowing noted. There is no epileptiform activities noted. IMPRESSION: 1. This is a normal recording of awake and sleep states. Kofi A. [name], M.D. Diplomate, Tax adviser of Psychiatry and Neurology ( Neurology).      Subjective: Feels that left arm pain/numbness/weakness has improved since admission.  Discharge Exam: Vitals:   09/26/19 2120 09/26/19 2130 09/27/19 0557 09/27/19 0831   BP: 109/70 116/71 105/69 125/81  Pulse: 90 88 81 72  Resp:  16 18   Temp:  98.1 F (36.7 C) 98.1 F (36.7 C)   TempSrc:  Oral    SpO2:   96%   Weight:  95.6 kg    Height:        General: Pt is alert, awake, not in acute distress Cardiovascular: RRR, S1/S2 +, no rubs, no gallops Respiratory: CTA bilaterally, no wheezing, no rhonchi Abdominal: Soft, NT, ND, bowel sounds + Extremities: Right below the knee amputation    The results of significant diagnostics from this hospitalization (including imaging, microbiology, ancillary and laboratory) are listed below for reference.     Microbiology: Recent Results (from the past 240 hour(s))  SARS Coronavirus 2 by RT PCR (hospital order, performed in [facility] hospital lab) Nasopharyngeal Nasopharyngeal Swab     Status: None   Collection Time: 09/24/19  9:30 PM   Specimen: Nasopharyngeal Swab  Result Value Ref Range Status   SARS Coronavirus 2 NEGATIVE NEGATIVE Final    Comment: (NOTE) SARS-CoV-2 target nucleic acids are NOT DETECTED.  The SARS-CoV-2 RNA is generally detectable in upper and lower respiratory specimens during the acute phase of infection. The lowest  concentration of SARS-CoV-2 viral copies this assay can detect is 250 copies / mL. A negative result does not preclude SARS-CoV-2 infection and should not be used as the sole basis for treatment or other patient management decisions.  A negative result may occur with improper specimen collection / handling, submission of specimen other than nasopharyngeal swab, presence of viral mutation(s) within the areas targeted by this assay, and inadequate number of viral copies (<250 copies / mL). A negative result must be combined with clinical observations, patient history, and epidemiological information.  Fact Sheet for Patients:   StrictlyIdeas.no  Fact Sheet for Healthcare Providers: BankingDealers.co.za  This test is  not yet approved or  cleared by the [place] FDA and has been authorized for detection and/or diagnosis of SARS-CoV-2 by FDA under an Emergency Use Authorization (EUA).  This EUA will remain in effect (meaning this test can be used) for the duration of the COVID-19 declaration under Section 564(b)(1) of the Act, 21 U.S.C. section 360bbb-3(b)(1), unless the authorization is terminated or revoked sooner.  Performed at [facility], [address], [place], [place] [address]      Labs: BNP (last 3 results) Recent Labs    02/15/19 0500 05/16/19 0814 06/07/19 1125  BNP 3,499.0* 928.0* 5,638.9*   Basic Metabolic Panel: Recent Labs  Lab 09/24/19 2043 09/25/19 0331 09/27/19 0951  NA 137 137 134*  K 5.4* 4.7 4.2  CL 93* 97* 90*  CO2 30 28 27   GLUCOSE 123* 118* 208*  BUN 41* 45* 45*  CREATININE 8.15* 8.52* 7.35*  CALCIUM 7.5* 7.7* 8.3*  MG  --  2.0  --   PHOS  --  5.7* 4.9*   Liver Function Tests: Recent Labs  Lab 09/24/19 2043 09/25/19 0331 09/27/19 0951  AST 18 16  --   ALT 17 17  --   ALKPHOS 115 113  --   BILITOT 0.4 0.5  --   PROT 7.7 6.7  --   ALBUMIN 3.8 3.4* 3.7   No results for input(s): LIPASE, AMYLASE in the last 168 hours. No results for input(s): AMMONIA in the last 168 hours. CBC: Recent Labs  Lab 09/24/19 2043 09/25/19 0331  WBC 7.1 7.3  NEUTROABS 4.4  --   HGB 11.4* 11.2*  HCT 36.4* 35.8*  MCV 106.7* 105.6*  PLT 228 223   Cardiac Enzymes: No results for input(s): CKTOTAL, CKMB, CKMBINDEX, TROPONINI in the last 168 hours. BNP: Invalid input(s): POCBNP CBG: Recent Labs  Lab 09/26/19 1159 09/26/19 1708 09/26/19 2117 09/27/19 0803 09/27/19 1140  GLUCAP 77 86 207* 218* 228*   D-Dimer No results for input(s): DDIMER in the last 72 hours. Hgb A1c No results for input(s): HGBA1C in the last 72 hours. Lipid Profile No results for input(s): CHOL, HDL, LDLCALC, TRIG, CHOLHDL, LDLDIRECT in the last 72 hours. Thyroid function  studies No results for input(s): TSH, T4TOTAL, T3FREE, THYROIDAB in the last 72 hours.  Invalid input(s): FREET3 Anemia work up Recent Labs    09/25/19 0331  VITAMINB12 444  FOLATE 10.7   Urinalysis No results found for: COLORURINE, APPEARANCEUR, [place], [place], [place], [place], [place], [place], PROTEINUR, UROBILINOGEN, NITRITE, LEUKOCYTESUR Sepsis Labs Invalid input(s): PROCALCITONIN,  WBC,  LACTICIDVEN Microbiology Recent Results (from the past 240 hour(s))  SARS Coronavirus 2 by RT PCR (hospital order, performed in [facility] hospital lab) Nasopharyngeal Nasopharyngeal Swab     Status: None   Collection Time: 09/24/19  9:30 PM   Specimen: Nasopharyngeal Swab  Result Value Ref  Range Status   SARS Coronavirus 2 NEGATIVE NEGATIVE Final    Comment: (NOTE) SARS-CoV-2 target nucleic acids are NOT DETECTED.  The SARS-CoV-2 RNA is generally detectable in upper and lower respiratory specimens during the acute phase of infection. The lowest concentration of SARS-CoV-2 viral copies this assay can detect is 250 copies / mL. A negative result does not preclude SARS-CoV-2 infection and should not be used as the sole basis for treatment or other patient management decisions.  A negative result may occur with improper specimen collection / handling, submission of specimen other than nasopharyngeal swab, presence of viral mutation(s) within the areas targeted by this assay, and inadequate number of viral copies (<250 copies / mL). A negative result must be combined with clinical observations, patient history, and epidemiological information.  Fact Sheet for Patients:   StrictlyIdeas.no  Fact Sheet for Healthcare Providers: BankingDealers.co.za  This test is not yet approved or  cleared by the [place] FDA and has been authorized for detection and/or diagnosis of SARS-CoV-2 by FDA under an Emergency Use Authorization (EUA).   This EUA will remain in effect (meaning this test can be used) for the duration of the COVID-19 declaration under Section 564(b)(1) of the Act, 21 U.S.C. section 360bbb-3(b)(1), unless the authorization is terminated or revoked sooner.  Performed at [facility], [address] [place], [place] [address]      Time coordinating discharge: 11mins  SIGNED:   Kathie Dike, MD  [facility] 09/27/2019, 8:06 PM   If 7PM-7AM, please contact night-coverage [email or web address]

## 2019-09-27 NOTE — Procedures (Signed)
  [place] A. [name], MD     [email or web address]           HISTORY: This is a 49 year old presents with recurrent spells of left-sided weakness suspicious for focal seizures.   MEDICATIONS: Scheduled Meds: . apixaban  5 mg Oral BID  . atorvastatin  10 mg Oral QHS  . calcium carbonate  1 tablet Oral Q breakfast  . calcium carbonate  800 mg of elemental calcium Oral BID  . carvedilol  3.125 mg Oral BID WC  . Chlorhexidine Gluconate Cloth  6 each Topical Q0600  . Chlorhexidine Gluconate Cloth  6 each Topical Q0600  . doxercalciferol  1.5 mcg Intravenous Q T,Th,Sa-HD  . insulin aspart  0-5 Units Subcutaneous QHS  . insulin aspart  0-9 Units Subcutaneous TID WC  . insulin glargine  10 Units Subcutaneous QHS  . multivitamin  1 tablet Oral Daily  . patiromer  8.4 g Oral Once per day on Mon Wed Fri   Continuous Infusions: . sodium chloride    . sodium chloride    . methylPREDNISolone (SOLU-MEDROL) injection Stopped (09/26/19 1308)   PRN Meds:.sodium chloride, sodium chloride, heparin, lidocaine (PF), lidocaine-prilocaine, pentafluoroprop-tetrafluoroeth     ANALYSIS: A 16 channel recording using standard 10 20 measurements is conducted for 28 minutes.  There is a well-formed posterior dominant rhythm a of 28 hertz which attenuates with eye opening. There is beta activity observed in the frontal areas. Awake and drowsy architecture is observed which transitions to stage II non-REM sleep with prominent K complexes and sleep spindles seen throughout most of the recording. There is normal myogenic interference noted. Photic stimulation is carried out without abnormal changes in the background activity. There is no focal or lateral slowing noted. There is no epileptiform activities noted.   IMPRESSION: 1. This is a normal recording of awake and sleep states.      Marquasia Schmieder A. [name], M.D.  Diplomate, Tax adviser of Psychiatry and Neurology ( Neurology).

## 2019-09-27 NOTE — Plan of Care (Signed)

## 2019-09-27 NOTE — TOC Progression Note (Signed)
Transition of Care [facility]) - Progression Note    Patient Details  Name: Brendan Campbell MRN: [id number] Date of Birth: December 29, 1970  Transition of Care [facility]) CM/SW Contact  Natasha Bence, LCSW Phone Number: 09/27/2019, 1:13 PM  Clinical Narrative:    CSW inquired whether patient would be agreeable to participate in OPPT. Patient agreeable to OPPT and referral for [facility]. CSW placed referral for OPPT for [facility]. TOC signing off.      Barriers to Discharge: Continued Medical Work up  Expected Discharge Plan and Services                                                 Social Determinants of Health (SDOH) Interventions    Readmission Risk Interventions No flowsheet data found.

## 2019-11-05 ENCOUNTER — Ambulatory Visit: Payer: Medicaid Other | Admitting: Cardiology

## 2019-11-19 ENCOUNTER — Ambulatory Visit: Payer: Medicaid Other | Admitting: Family Medicine

## 2019-11-19 NOTE — Progress Notes (Deleted)
Cardiology Office Note  Date: 11/19/2019   ID: Brendan Campbell, DOB August 26, 1970, MRN [id number]  PCP:  Monico Blitz, MD  Cardiologist:  Carlyle Dolly, MD Electrophysiologist:  None   Chief Complaint: Hospital follow-up  History of Present Illness: Brendan Campbell is a 49 y.o. male with a history of HTN, HLD, DM2, ESRD on hemodialysis, atrial fibrillation, right BKA, diabetic neuropathy, hypocalcemia, chronic systolic CHF with EF by echo 07/2019, persistent atrial flutter (status post DCCV and January 2021, medication noncompliance, HD noncompliance.  From notes multiple admissions for hyperkalemia and pulmonary edema after missing HD.  Had a visit with Bernerd Pho, PA-C 08/23/2019 for follow-up for chronic systolic heart failure for recent emergency room visit.  During the visit patient reported having episodes of chest discomfort which could occur sporadically with or without activity.  He reported this started after he required CPR and April 2021.  Having baseline dyspnea on exertion.  He denied orthopnea, PND, or lower extremity edema.  Had no recent palpitations, dizziness or presyncope   Recently presented to the emergency department due to left arm and leg pain and numbness on 09/24/2019.  He had a similar stroke work-up in June and had a negative MRI.,  Carotid, and echocardiogram.  He was then thought to be hypocalcemic.  In the emergency department he was hemodynamically stable.  Work-up showed microcytic anemia, hypokalemia, CT of the head showed no acute intracranial abnormality, hemorrhage or evidence of acute ischemia.  Teleneurology was consulted and recommended MRI of the cervical and lumbar spine.  He was admitted for evaluation and management.  Left-sided weakness and numbness possibly related to cervical myelopathy.  Case was discussed with Dr. Ronnald Ramp neurosurgery.  It was felt findings would not be explained simply by findings on MRI C-spine.  MRI of the brain did not show  any acute infarct.  EEG ordered and found to be unremarkable.  Had a trial of high-dose Solu-Medrol with improvement.  He was given oral supplementation for hypocalcemia.  He is followed in the hospital by nephrology for dialysis needs.  He was chronically on Lantus 10 units at bedtime and continuing sliding scale insulin for his diabetes.  Blood sugars were stable.  Currently on Coreg and apixaban for his atrial fibrillation.  He was continuing statin for hyperlipidemia.  His folate and B12 are in normal range in relation to microcytic anemia.  His chronic combined CHF with EF of 25% grade 1 DD was compensated.  Past Medical History:  Diagnosis Date  . Anemia   . Anxiety   . Blood transfusion without reported diagnosis   . CHF (congestive heart failure) (HCC)    a. EF 25% by echo in 07/2019  . Chronic kidney disease   . Depression   . Diabetes mellitus without complication ([place])   . End stage renal disease ([place])    M/W/F [facility]  . Hyperlipidemia   . Neuropathy   . Peripheral vascular disease ([place])   . PTSD (post-traumatic stress disorder)     Past Surgical History:  Procedure Laterality Date  . A/V FISTULAGRAM N/A 10/09/2018   Procedure: A/V FISTULAGRAM;  Surgeon: Serafina Mitchell, MD;  Location: [place] [facility];  Service: Cardiovascular;  Laterality: N/A;  . AV FISTULA PLACEMENT Left 09/22/2016   Procedure: CREATION OF LEFT ARM ARTERIOVENOUS (AV) FISTULA;  Surgeon: Waynetta Sandy, MD;  Location: [place];  Service: Vascular;  Laterality: Left;  . AV FISTULA PLACEMENT Left 10/31/2017   Procedure: INSERTION  OF ARTERIOVENOUS (AV) GORE-TEX 4-92mm STETCH GRAFT LEFT ARM;  Surgeon: Waynetta Sandy, MD;  Location: [place];  Service: Vascular;  Laterality: Left;  . BASCILIC VEIN TRANSPOSITION Left 08/17/2017   Procedure: SECOND STAGE BASILIC VEIN TRANSPOSITION LEFT ARM;  Surgeon: Waynetta Sandy, MD;  Location: [place];  Service: Vascular;  Laterality: Left;  . BELOW  KNEE LEG AMPUTATION Right   . CARDIOVERSION N/A 02/19/2019   Procedure: CARDIOVERSION;  Surgeon: [name], MD;  Location: [facility] ENDOS[facility];  Service: Cardiovascular;  Laterality: N/A;  . CHOLECYSTECTOMY    . FOOT SURGERY    . IR FLUORO GUIDE CV LINE RIGHT  05/16/2016  . IR FLUORO GUIDE CV LINE RIGHT  02/16/2019  . IR REMOVAL TUN CV CATH W/O FL  05/16/2016  . IR REMOVAL TUN CV CATH W/O FL  02/19/2019  . IR THROMBECTOMY AV FISTULA W/THROMBOLYSIS/PTA INC/SHUNT/IMG LEFT Left 11/09/2018  . IR THROMBECTOMY AV FISTULA W/THROMBOLYSIS/PTA INC/SHUNT/IMG LEFT Left 06/22/2019  . IR US GUIDE VASC ACCESS LEFT  11/09/2018  . IR US GUIDE VASC ACCESS LEFT  06/22/2019  . IR US GUIDE VASC ACCESS RIGHT  05/16/2016  . IR US GUIDE VASC ACCESS RIGHT  02/16/2019  . PERIPHERAL VASCULAR BALLOON ANGIOPLASTY  10/09/2018   Procedure: PERIPHERAL VASCULAR BALLOON ANGIOPLASTY;  Surgeon: Serafina Mitchell, MD;  Location: [place] [facility];  Service: Cardiovascular;;  . TEE WITHOUT CARDIOVERSION N/A 02/19/2019   Procedure: TRANSESOPHAGEAL ECHOCARDIOGRAM (TEE);  Surgeon: [name], MD;  Location: [facility] ENDOSCOPY;  Service: Cardiovascular;  Laterality: N/A;    Current Outpatient Medications  Medication Sig Dispense Refill  . apixaban (ELIQUIS) 5 MG TABS tablet Take 1 tablet (5 mg total) by mouth 2 (two) times daily. 180 tablet 3  . aspirin 81 MG chewable tablet Chew 81 mg by mouth daily.    [name] atorvastatin (LIPITOR) 10 MG tablet Take 1 tablet (10 mg total) by mouth every evening. 90 tablet 2  . calcium acetate (PHOSLO) 667 MG capsule Take 1 capsule (667 mg total) by mouth 3 (three) times daily with meals. 90 capsule 2  . calcium carbonate (TUMS - DOSED IN MG ELEMENTAL CALCIUM) 500 MG chewable tablet Chew 4 tablets (800 mg of elemental calcium total) by mouth 2 (two) times daily. 60 tablet 2  . carvedilol (COREG) 3.125 MG tablet Take 1 tablet (3.125 mg total) by mouth 2 (two) times daily with a meal. 180 tablet 3  . LANTUS SOLOSTAR 100  UNIT/ML Solostar Pen Inject 10 Units into the skin at bedtime.     . lidocaine-prilocaine (EMLA) cream Apply 1 application topically See admin instructions. Applied three times weekly at dialysis on MWF    . multivitamin (RENA-VIT) TABS tablet Take 1 tablet by mouth daily.    . patiromer (VELTASSA) 8.4 g packet Take 1 packet (8.4 g total) by mouth 3 (three) times a week. On Tuesday, Thursday and Saturday 30 each 0   No current facility-administered medications for this visit.   Allergies:  Tape   Social History: The patient  reports that he has been smoking. He has never used smokeless tobacco. He reports that he does not drink alcohol and does not use drugs.   Family History: The patient's family history includes Cancer in his mother; Diabetes in his father, mother, and sister; Heart attack in his father.   ROS:  Please see the history of present illness. Otherwise, complete review of systems is positive for {NONE DEFAULTED:18576::"none"}.  All other systems are reviewed  and negative.   Physical Exam: VS:  There were no vitals taken for this visit., BMI There is no height or weight on file to calculate BMI.  Wt Readings from Last 3 Encounters:  09/26/19 210 lb 12.2 oz (95.6 kg)  08/23/19 212 lb (96.2 kg)  08/09/19 196 lb (88.9 kg)    General: Patient appears comfortable at rest. HEENT: Conjunctiva and lids normal, oropharynx clear with moist mucosa. Neck: Supple, no elevated JVP or carotid bruits, no thyromegaly. Lungs: Clear to auscultation, nonlabored breathing at rest. Cardiac: Regular rate and rhythm, no S3 or significant systolic murmur, no pericardial rub. Abdomen: Soft, nontender, no hepatomegaly, bowel sounds present, no guarding or rebound. Extremities: No pitting edema, distal pulses 2+. Skin: Warm and dry. Musculoskeletal: No kyphosis. Neuropsychiatric: Alert and oriented x3, affect grossly appropriate.  ECG:  {EKG/Telemetry Strips [phone]}  Recent  Labwork: 06/07/2019: B Natriuretic Peptide 3,441.0 08/02/2019: TSH 1.960 09/25/2019: ALT 17; AST 16; Hemoglobin 11.2; Magnesium 2.0; Platelets 223 09/27/2019: BUN 45; Creatinine, Ser 7.35; Potassium 4.2; Sodium 134     Component Value Date/Time   CHOL 126 08/03/2019 0437   CHOL 238 (H) 09/15/2015 0822   TRIG 55 08/03/2019 0437   HDL 39 (L) 08/03/2019 0437   HDL 36 (L) 09/15/2015 0822   CHOLHDL 3.2 08/03/2019 0437   VLDL 11 08/03/2019 0437   LDLCALC 76 08/03/2019 0437   LDLCALC 149 (H) 09/15/2015 0822    Other Studies Reviewed Today: Procedures/Studies:  CT HEAD WO CONTRAST  Result Date: 09/24/2019 CLINICAL DATA:  Stroke, follow up Left-sided pain and weakness onset this morning. EXAM: CT HEAD WITHOUT CONTRAST TECHNIQUE: Contiguous axial images were obtained from the base of the skull through the vertex without intravenous contrast. COMPARISON:  Head CT and brain MRI 08/02/2019 FINDINGS: Brain: Stable degree of atrophy and chronic small vessel ischemia. No intracranial hemorrhage, mass effect, or midline shift. No hydrocephalus. The basilar cisterns are patent. No evidence of territorial infarct or acute ischemia. No extra-axial or intracranial fluid collection. Vascular: Atherosclerosis of skullbase vasculature without hyperdense vessel or abnormal calcification. Skull: No fracture or focal lesion. Sinuses/Orbits: Opacification of a single right mastoid air cell is new from prior exam. Mastoid air cells are otherwise clear. Paranasal sinuses are clear. Orbits are unremarkable. Other: None. IMPRESSION: 1. No acute intracranial abnormality. No hemorrhage or evidence of acute ischemia. 2. Stable atrophy and chronic small vessel ischemia. Electronically Signed   By: Keith Rake M.D.   On: 09/24/2019 21:54   MR BRAIN WO CONTRAST  Result Date: 09/25/2019 CLINICAL DATA:  Left-sided numbness EXAM: MRI HEAD WITHOUT CONTRAST TECHNIQUE: Multiplanar, multiecho pulse sequences of the brain and  surrounding structures were obtained without intravenous contrast. COMPARISON:  08/02/2018 FINDINGS: Brain: There is no acute infarction or intracranial hemorrhage. There is no intracranial mass, mass effect, or edema. There is no hydrocephalus or extra-axial fluid collection. Patchy and confluent areas of T2 hyperintensity in the supratentorial white matter are nonspecific but probably reflect moderate chronic microvascular ischemic changes. There is ex vacuo dilatation of the lateral ventricles. Vascular: Major vessel flow voids at the skull base are preserved. Skull and upper cervical spine: Normal marrow signal is preserved. Sinuses/Orbits: Paranasal sinuses are aerated. Orbits are unremarkable. Other: Sella is unremarkable.  Mastoid air cells are clear. IMPRESSION: No evidence of recent infarction, hemorrhage, or mass. Moderate chronic microvascular ischemic changes, greater than expected for age. Electronically Signed   By: Macy Mis M.D.   On: 09/25/2019 11:20   MR CERVICAL  SPINE WO CONTRAST  Result Date: 09/25/2019 CLINICAL DATA:  Left-sided numbness or tingling. EXAM: MRI CERVICAL SPINE WITHOUT CONTRAST TECHNIQUE: Multiplanar, multisequence MR imaging of the cervical spine was performed. No intravenous contrast was administered. COMPARISON:  None. FINDINGS: Alignment: Straightening of the cervical spine. Vertebrae: No fracture, evidence of discitis, or bone lesion. Cord: Normal signal and morphology. Posterior Fossa, vertebral arteries, paraspinal tissues: Negative. Disc levels: C2-3: Unremarkable. C3-4: Unremarkable. C4-5: Unremarkable. C5-6: Disc narrowing with left paracentral shallow protrusion. Negative facets. No neural compression C6-7: Greatest level of degenerative disc narrowing. Uncovertebral spurring and left-sided disc osteophyte complex causing high-grade foraminal impingement and mild left cord flattening. Negative facets. C7-T1:Unremarkable. IMPRESSION: C5-6 and C6-7 disc  degeneration, more notable at C6-7 where there is prominent left uncovertebral and paracentral spurring causing foraminal impingement and mild left cord mass effect. No other level of left foraminal impingement. Electronically Signed   By: Monte Fantasia M.D.   On: 09/25/2019 07:05   MR LUMBAR SPINE WO CONTRAST  Result Date: 09/25/2019 CLINICAL DATA:  Left-sided numbness and pain. Left leg weakness upon awakening this morning EXAM: MRI LUMBAR SPINE WITHOUT CONTRAST TECHNIQUE: Multiplanar, multisequence MR imaging of the lumbar spine was performed. No intravenous contrast was administered. COMPARISON:  None. FINDINGS: Segmentation:  Standard lumbar numbering. Alignment: Grade 1 anterolisthesis at L5-S1 that is associated with a right-sided chronic pars defect at L5. Vertebrae: Schmorl's node at the L2 superior endplate. No acute fracture, discitis, or aggressive bone lesion. Conus medullaris and cauda equina: Conus extends to the L1-2 level. Conus and cauda equina appear normal. Paraspinal and other soft tissues: Negative Disc levels: L1-L2: Mild disc bulging. L2-L3: Mild disc bulging. L3-L4: Mild disc bulging. L4-L5: Disc bulging and left foraminal annular fissure with protrusion causing foraminal impingement when combined with mild facet spurring. Disc bulging also narrows the right foramen to a milder degree. Widely patent canal. L5-S1:Right-sided L5 pars defect with anterolisthesis. Greatest level of degenerative disc narrowing with left eccentric far-lateral spurring. Bilateral foraminal narrowing which is somewhat prominent on sagittal images but noncompressive based on axial slices. Widely patent canal. IMPRESSION: 1. L4-5 left foraminal protrusion with moderate left L4 impingement. 2. 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 Noncompressive foraminal narrowing at this level on the left more than right. 3. Diffusely patent spinal canal. Electronically Signed    By: Monte Fantasia M.D.   On: 09/25/2019 07:09   EEG adult  Result Date: 09/27/2019 Phillips Odor, MD     09/27/2019 11:30 AM [place] A. [name], MD     [email or web address]       HISTORY: This is a 49 year old presents with recurrent spells of left-sided weakness suspicious for focal seizures. MEDICATIONS: Scheduled Meds: . apixaban  5 mg Oral BID . atorvastatin  10 mg Oral QHS . calcium carbonate  1 tablet Oral Q breakfast . calcium carbonate  800 mg of elemental calcium Oral BID . carvedilol  3.125 mg Oral BID WC . Chlorhexidine Gluconate Cloth  6 each Topical Q0600 . Chlorhexidine Gluconate Cloth  6 each Topical Q0600 . doxercalciferol  1.5 mcg Intravenous Q T,Th,Sa-HD . insulin aspart  0-5 Units Subcutaneous QHS . insulin aspart  0-9 Units Subcutaneous TID WC . insulin glargine  10 Units Subcutaneous QHS . multivitamin  1 tablet Oral Daily . patiromer  8.4 g Oral Once per day on Mon Wed Fri Continuous Infusions: . sodium chloride   . sodium chloride   . methylPREDNISolone (SOLU-MEDROL) injection Stopped (09/26/19 1308)  PRN Meds:.sodium chloride, sodium chloride, heparin, lidocaine (PF), lidocaine-prilocaine, pentafluoroprop-tetrafluoroeth ANALYSIS: A 16 channel recording using standard 10 20 measurements is conducted for 28 minutes.  There is a well-formed posterior dominant rhythm a of 28 hertz which attenuates with eye opening. There is beta activity observed in the frontal areas. Awake and drowsy architecture is observed which transitions to stage II non-REM sleep with prominent K complexes and sleep spindles seen throughout most of the recording. There is normal myogenic interference noted. Photic stimulation is carried out without abnormal changes in the background activity. There is no focal or lateral slowing noted. There is no epileptiform activities noted. IMPRESSION: 1. This is a normal recording of awake and sleep states. Kofi A. [name], M.D. Diplomate, Tax adviser  of Psychiatry and Neurology ( Neurology).    Echocardiogram: 07/2019 IMPRESSIONS  1. Left ventricular ejection fraction, by estimation, is approximately  25%. The left ventricle has severely decreased function. The left  ventricle demonstrates regional wall motion abnormalities (see scoring  diagram/findings for description). There is  moderate left ventricular hypertrophy. Left ventricular diastolic  parameters are consistent with Grade I diastolic dysfunction (impaired  relaxation).  2. Right ventricular systolic function is normal. The right ventricular  size is normal. Tricuspid regurgitation signal is inadequate for assessing  PA pressure.  3. The mitral valve is grossly normal, mildly calcified annulus. Trivial  mitral valve regurgitation.  4. The aortic valve is tricuspid. Aortic valve regurgitation is not  visualized. Mild aortic valve sclerosis is present, with no evidence of  aortic valve stenosis.  5. The inferior vena cava is normal in size with greater than 50%  respiratory variability, suggesting right atrial pressure of 3 mmHg.   Assessment and Plan:  1. Chronic systolic heart failure (HCC)   2. Chest pain, unspecified type   3. Atrial flutter, unspecified type ([place])   4. Essential hypertension   5. ESRD on hemodialysis ([place])    1. Chronic systolic heart failure (HCC) ***  2. Chest pain, unspecified type ***  3. Atrial flutter, unspecified type (HCC) ***  4. Essential hypertension ***  5. ESRD on hemodialysis (HCC)  *** Medication Adjustments/Labs and Tests Ordered: Current medicines are reviewed at length with the patient today.  Concerns regarding medicines are outlined above.   Disposition: Follow-up with ***  Signed, Levell July, NP 11/19/2019 7:59 AM    [place] at [facility] [place], [place], [place] [address] Phone: [phone]; Fax: [phone]

## 2019-12-13 ENCOUNTER — Observation Stay (HOSPITAL_COMMUNITY)
Admission: EM | Admit: 2019-12-13 | Discharge: 2019-12-14 | Disposition: A | Discharge status: Home / Self Care | Payer: Medicaid Other | Attending: Internal Medicine | Admitting: Internal Medicine

## 2019-12-13 ENCOUNTER — Encounter (HOSPITAL_COMMUNITY): Payer: Self-pay

## 2019-12-13 ENCOUNTER — Emergency Department (HOSPITAL_COMMUNITY): Payer: Medicaid Other

## 2019-12-13 ENCOUNTER — Other Ambulatory Visit: Payer: Self-pay

## 2019-12-13 DIAGNOSIS — Z992 Dependence on renal dialysis: Secondary | ICD-10-CM | POA: Diagnosis not present

## 2019-12-13 DIAGNOSIS — N186 End stage renal disease: Secondary | ICD-10-CM | POA: Diagnosis not present

## 2019-12-13 DIAGNOSIS — Z720 Tobacco use: Secondary | ICD-10-CM | POA: Diagnosis not present

## 2019-12-13 DIAGNOSIS — Z89512 Acquired absence of left leg below knee: Secondary | ICD-10-CM

## 2019-12-13 DIAGNOSIS — Z7901 Long term (current) use of anticoagulants: Secondary | ICD-10-CM | POA: Insufficient documentation

## 2019-12-13 DIAGNOSIS — E1121 Type 2 diabetes mellitus with diabetic nephropathy: Secondary | ICD-10-CM | POA: Insufficient documentation

## 2019-12-13 DIAGNOSIS — R0602 Shortness of breath: Secondary | ICD-10-CM | POA: Diagnosis not present

## 2019-12-13 DIAGNOSIS — I482 Chronic atrial fibrillation, unspecified: Secondary | ICD-10-CM | POA: Diagnosis not present

## 2019-12-13 DIAGNOSIS — I5041 Acute combined systolic (congestive) and diastolic (congestive) heart failure: Secondary | ICD-10-CM | POA: Diagnosis not present

## 2019-12-13 DIAGNOSIS — R0902 Hypoxemia: Secondary | ICD-10-CM

## 2019-12-13 DIAGNOSIS — E213 Hyperparathyroidism, unspecified: Secondary | ICD-10-CM | POA: Diagnosis present

## 2019-12-13 DIAGNOSIS — E1122 Type 2 diabetes mellitus with diabetic chronic kidney disease: Secondary | ICD-10-CM | POA: Diagnosis present

## 2019-12-13 DIAGNOSIS — Z20822 Contact with and (suspected) exposure to covid-19: Secondary | ICD-10-CM | POA: Diagnosis not present

## 2019-12-13 DIAGNOSIS — E875 Hyperkalemia: Secondary | ICD-10-CM | POA: Diagnosis not present

## 2019-12-13 DIAGNOSIS — Z7982 Long term (current) use of aspirin: Secondary | ICD-10-CM | POA: Insufficient documentation

## 2019-12-13 DIAGNOSIS — I509 Heart failure, unspecified: Secondary | ICD-10-CM

## 2019-12-13 DIAGNOSIS — Z794 Long term (current) use of insulin: Secondary | ICD-10-CM | POA: Diagnosis not present

## 2019-12-13 DIAGNOSIS — Z89511 Acquired absence of right leg below knee: Secondary | ICD-10-CM

## 2019-12-13 LAB — BRAIN NATRIURETIC PEPTIDE: B Natriuretic Peptide: 1867 pg/mL — ABNORMAL HIGH (ref 0.0–100.0)

## 2019-12-13 LAB — BASIC METABOLIC PANEL
Anion gap: 17 — ABNORMAL HIGH (ref 5–15)
BUN: 68 mg/dL — ABNORMAL HIGH (ref 6–20)
CO2: 21 mmol/L — ABNORMAL LOW (ref 22–32)
Calcium: 6.6 mg/dL — ABNORMAL LOW (ref 8.9–10.3)
Chloride: 99 mmol/L (ref 98–111)
Creatinine, Ser: 12.12 mg/dL — ABNORMAL HIGH (ref 0.61–1.24)
GFR, Estimated: 5 mL/min — ABNORMAL LOW (ref >60)
Glucose, Bld: 69 mg/dL — ABNORMAL LOW (ref 70–99)
Potassium: 6.6 mmol/L (ref 3.5–5.1)
Sodium: 137 mmol/L (ref 135–145)

## 2019-12-13 LAB — CBC WITH DIFFERENTIAL/PLATELET
Abs Immature Granulocytes: 0.06 10*3/uL (ref 0.00–0.07)
Basophils Absolute: 0.1 10*3/uL (ref 0.0–0.1)
Basophils Relative: 0 %
Eosinophils Absolute: 0.1 10*3/uL (ref 0.0–0.5)
Eosinophils Relative: 0 %
HCT: 31.7 % — ABNORMAL LOW (ref 39.0–52.0)
Hemoglobin: 10.6 g/dL — ABNORMAL LOW (ref 13.0–17.0)
Immature Granulocytes: 0 %
Lymphocytes Relative: 9 %
Lymphs Abs: 1.3 10*3/uL (ref 0.7–4.0)
MCH: 34.9 pg — ABNORMAL HIGH (ref 26.0–34.0)
MCHC: 33.4 g/dL (ref 30.0–36.0)
MCV: 104.3 fL — ABNORMAL HIGH (ref 80.0–100.0)
Monocytes Absolute: 0.7 10*3/uL (ref 0.1–1.0)
Monocytes Relative: 5 %
Neutro Abs: 11.8 10*3/uL — ABNORMAL HIGH (ref 1.7–7.7)
Neutrophils Relative %: 86 %
Platelets: 247 10*3/uL (ref 150–400)
RBC: 3.04 MIL/uL — ABNORMAL LOW (ref 4.22–5.81)
RDW: 13.2 % (ref 11.5–15.5)
WBC: 13.9 10*3/uL — ABNORMAL HIGH (ref 4.0–10.5)
nRBC: 0 % (ref 0.0–0.2)

## 2019-12-13 LAB — RESPIRATORY PANEL BY RT PCR (FLU A&B, COVID)
Influenza A by PCR: NEGATIVE
Influenza B by PCR: NEGATIVE
SARS Coronavirus 2 by RT PCR: NEGATIVE

## 2019-12-13 LAB — CBG MONITORING, ED
Glucose-Capillary: 125 mg/dL — ABNORMAL HIGH (ref 70–99)
Glucose-Capillary: 58 mg/dL — ABNORMAL LOW (ref 70–99)

## 2019-12-13 IMAGING — DX DG CHEST 1V PORT
1 series · 1 of 1 positions shown · non-contrast
Comparison: [DATE]

CLINICAL DATA: Shortness of breath and cough.

EXAM:
PORTABLE CHEST 1 VIEW

[chest ap]
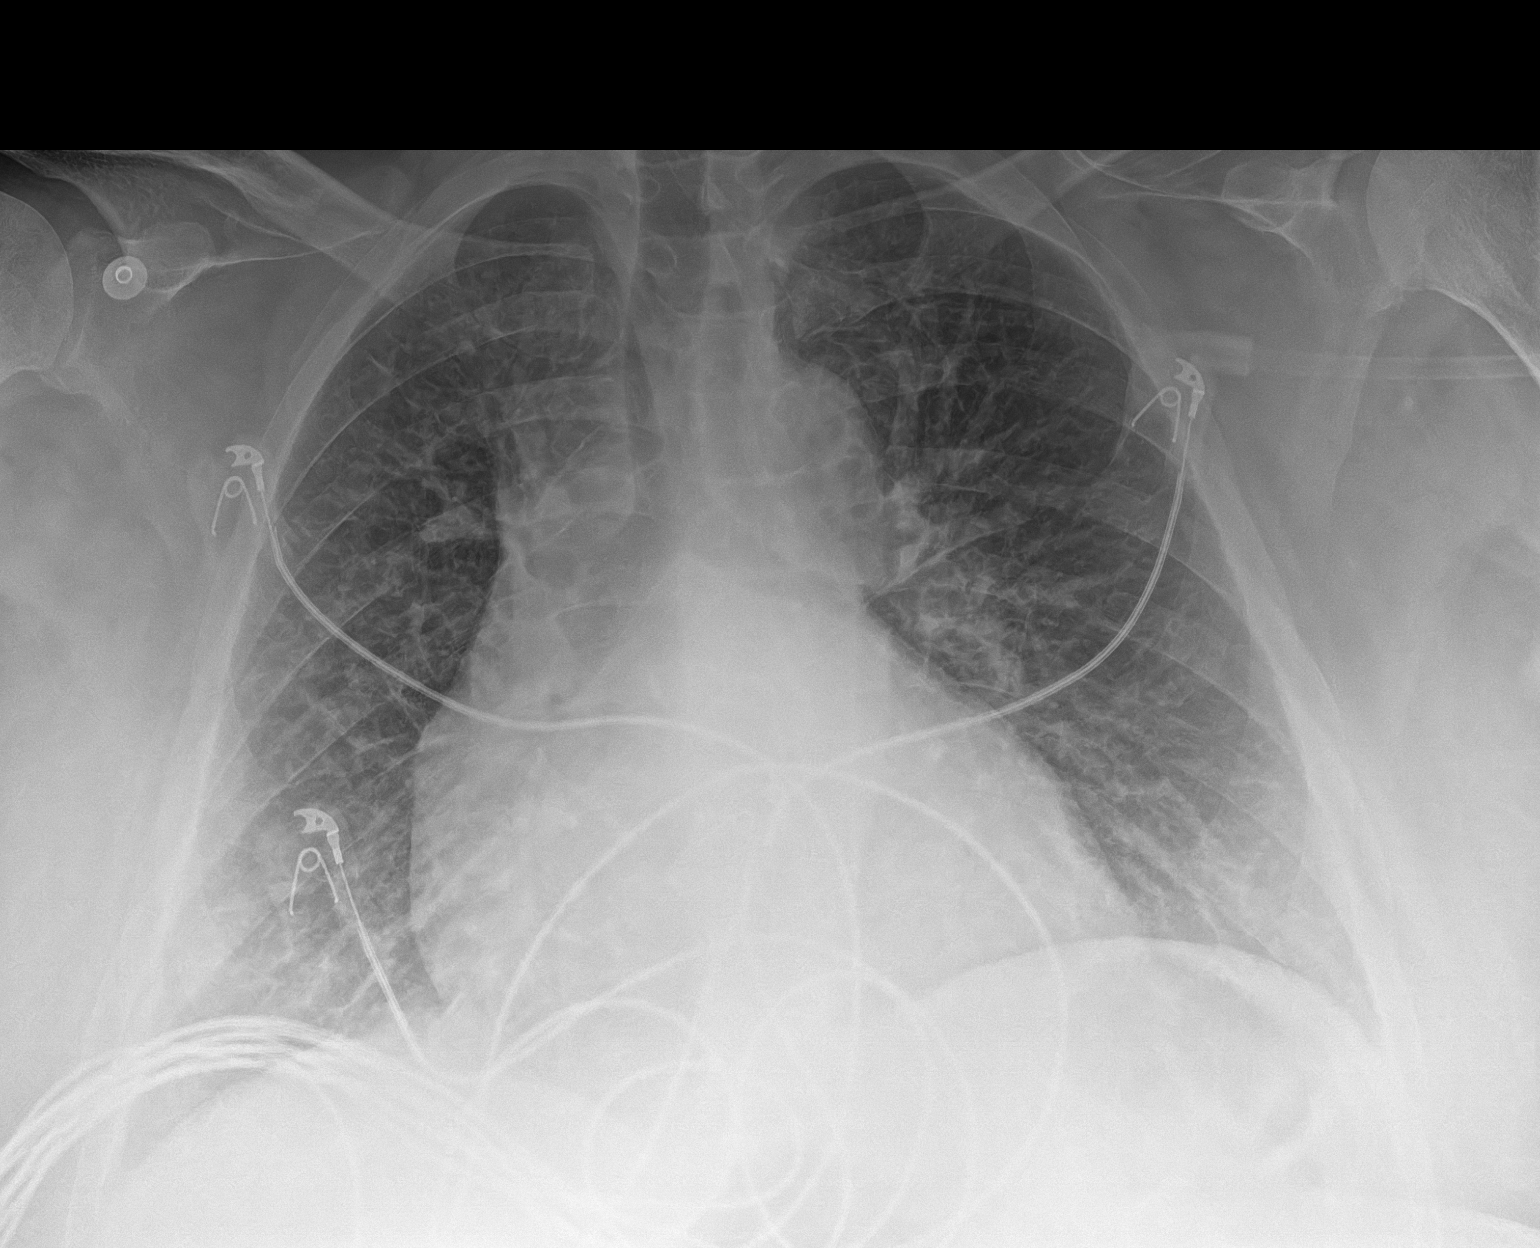

[1 of 1 positions shown; findings below may reference images not displayed]

FINDINGS: Mild cardiac enlargement. Diffuse pulmonary vascular congestion. No
pleural effusion or overt edema. No airspace densities identified.
IMPRESSION: Cardiac enlargement and pulmonary vascular congestion.

## 2019-12-13 MED ORDER — INSULIN ASPART 100 UNIT/ML IV SOLN
5.0000 [IU] | Freq: Once | INTRAVENOUS | Status: AC
  Administered 2019-12-13: 5 [IU] via INTRAVENOUS

## 2019-12-13 MED ORDER — CALCIUM CARBONATE ANTACID 500 MG PO CHEW: 800.0000 mg | CHEWABLE_TABLET | Freq: Two times a day (BID) | ORAL | Status: DC

## 2019-12-13 MED ORDER — ONDANSETRON HCL 4 MG/2ML IJ SOLN: 4.0000 mg | Freq: Four times a day (QID) | INTRAMUSCULAR | Status: DC | PRN

## 2019-12-13 MED ORDER — INSULIN ASPART 100 UNIT/ML ~~LOC~~ SOLN: 0.0000 [IU] | Freq: Every day | SUBCUTANEOUS | Status: DC

## 2019-12-13 MED ORDER — RENA-VITE PO TABS
1.0000 | ORAL_TABLET | Freq: Every day | ORAL | Status: DC
  Administered 2019-12-14: 1 via ORAL
  Filled 2019-12-13 (×3): qty 1

## 2019-12-13 MED ORDER — CALCIUM GLUCONATE-NACL 1-0.675 GM/50ML-% IV SOLN
1.0000 g | Freq: Once | INTRAVENOUS | Status: AC
  Administered 2019-12-13: 1000 mg via INTRAVENOUS
  Filled 2019-12-13: qty 50

## 2019-12-13 MED ORDER — ACETAMINOPHEN 325 MG PO TABS: 650.0000 mg | ORAL_TABLET | ORAL | Status: DC | PRN

## 2019-12-13 MED ORDER — SODIUM CHLORIDE 0.9% FLUSH: 3.0000 mL | INTRAVENOUS | Status: DC | PRN

## 2019-12-13 MED ORDER — SODIUM CHLORIDE 0.9% FLUSH
3.0000 mL | Freq: Two times a day (BID) | INTRAVENOUS | Status: DC
  Administered 2019-12-13 – 2019-12-14 (×2): 3 mL via INTRAVENOUS

## 2019-12-13 MED ORDER — DEXTROSE 50 % IV SOLN
1.0000 | Freq: Once | INTRAVENOUS | Status: AC
  Administered 2019-12-13: 50 mL via INTRAVENOUS
  Filled 2019-12-13: qty 50

## 2019-12-13 MED ORDER — LIDOCAINE HCL (PF) 1 % IJ SOLN: 5.0000 mL | INTRAMUSCULAR | Status: DC | PRN

## 2019-12-13 MED ORDER — ATORVASTATIN CALCIUM 10 MG PO TABS: 10.0000 mg | ORAL_TABLET | Freq: Every evening | ORAL | Status: DC

## 2019-12-13 MED ORDER — SODIUM CHLORIDE 0.9 % IV SOLN: 100.0000 mL | INTRAVENOUS | Status: DC | PRN

## 2019-12-13 MED ORDER — DEXTROSE 10 % IV SOLN: Freq: Once | INTRAVENOUS | Status: DC

## 2019-12-13 MED ORDER — INSULIN ASPART 100 UNIT/ML ~~LOC~~ SOLN: 0.0000 [IU] | Freq: Three times a day (TID) | SUBCUTANEOUS | Status: DC

## 2019-12-13 MED ORDER — SODIUM CHLORIDE 0.9 % IV SOLN: 250.0000 mL | INTRAVENOUS | Status: DC | PRN

## 2019-12-13 MED ORDER — ACETAMINOPHEN 325 MG PO TABS
650.0000 mg | ORAL_TABLET | Freq: Four times a day (QID) | ORAL | Status: DC | PRN
  Administered 2019-12-13 – 2019-12-14 (×2): 650 mg via ORAL
  Filled 2019-12-13 (×2): qty 2

## 2019-12-13 MED ORDER — CALCIUM ACETATE (PHOS BINDER) 667 MG PO CAPS
667.0000 mg | ORAL_CAPSULE | Freq: Three times a day (TID) | ORAL | Status: DC
  Administered 2019-12-14: 667 mg via ORAL
  Filled 2019-12-13 (×8): qty 1

## 2019-12-13 MED ORDER — INSULIN GLARGINE 100 UNIT/ML ~~LOC~~ SOLN
10.0000 [IU] | Freq: Every day | SUBCUTANEOUS | Status: DC
  Filled 2019-12-13 (×3): qty 0.1

## 2019-12-13 MED ORDER — PATIROMER SORBITEX CALCIUM 8.4 G PO PACK
8.4000 g | PACK | ORAL | Status: DC
  Filled 2019-12-13: qty 1

## 2019-12-13 MED ORDER — APIXABAN 5 MG PO TABS
5.0000 mg | ORAL_TABLET | Freq: Two times a day (BID) | ORAL | Status: DC
  Administered 2019-12-13 – 2019-12-14 (×2): 5 mg via ORAL
  Filled 2019-12-13 (×2): qty 1

## 2019-12-13 MED ORDER — CARVEDILOL 3.125 MG PO TABS
3.1250 mg | ORAL_TABLET | Freq: Two times a day (BID) | ORAL | Status: DC
  Administered 2019-12-14: 3.125 mg via ORAL
  Filled 2019-12-13: qty 1

## 2019-12-13 MED ORDER — PENTAFLUOROPROP-TETRAFLUOROETH EX AERO: 1.0000 "application " | INHALATION_SPRAY | CUTANEOUS | Status: DC | PRN

## 2019-12-13 MED ORDER — LIDOCAINE-PRILOCAINE 2.5-2.5 % EX CREA: 1.0000 "application " | TOPICAL_CREAM | CUTANEOUS | Status: DC | PRN

## 2019-12-13 MED ORDER — ASPIRIN 81 MG PO CHEW
81.0000 mg | CHEWABLE_TABLET | Freq: Every day | ORAL | Status: DC
  Administered 2019-12-14: 81 mg via ORAL
  Filled 2019-12-13: qty 1

## 2019-12-13 NOTE — ED Notes (Signed)
Date and time results received: 12/13/19 2:37 PM  (use smartphrase ".now" to insert current time)  Test: Potassium Critical Value: 6.6  Name of Provider Notified: Dr. Helane Gunther  Orders Received? Or Actions Taken?:N/a

## 2019-12-13 NOTE — Treatment Plan (Signed)
Patient arrived to ER SOB COVID negative. No dialysis since Monday.  He is known well to our service. Will dialyze emergently for hyperkalemia. He will be admitted and we will see formally tomorrow.

## 2019-12-13 NOTE — Procedures (Signed)
   EMERGENT HEMODIALYSIS TREATMENT NOTE:  Dialysis was performed in ED d/t hyperkalemia with no inpatient bed assignment.    4 hour low-heparin HD session via LUE AVG (15g/antegrade).  BP started trending down last 30 minutes and UFR was decreased.  Goal still met: 3 liters removed.  All blood was returned and hemostasis was achieved in 10 minutes.    Saturating 98% on room air.  No complaints.  Feels well enough to go home "if they can get me a way."  Rockwell Alexandria, RN

## 2019-12-13 NOTE — H&P (Signed)
History and Physical  [name] CBS:[id number] DOB: [birth year] DOA: 12/13/2019  Referring physician: Dr Dewayne Hatch, ED physician PCP: Monico Blitz, MD  Outpatient Specialists: [facility] (cardiology) D[facility]: [name] (M[facility])   Patient Coming From: home  Chief Complaint: SOB  HPI: Brendan Campbell is a 49 y.o. male with a history of diabetes, congestive heart failure with an EF of 25% and grade 1 diastolic dysfunction on echo 07/2019, diabetic neuropathy, status post right BKA, end-stage renal disease on dialysis Monday Wednesday Friday.  Patient last went to dialysis on Monday.  He did not go Wednesday due to sleeping and because he took Tylenol PM for with spurring in his neck and pain.  He was going to go this morning, however he had shortness of breath when he woke up.  Because of the shortness of breath, he tried to take some medicine to improve it, but it did not help.  Breathing worse when he was lying down and improved with sitting up.  He came to the hospital for evaluation and was found to be mildly hypoxic.   Emergency Department Course: Oxygen saturation on arrival 91.  Patient slightly tachycardic.  Labs are grossly abnormal with a potassium of 6.6 creatinine of 12.12 BUN 68 BNP of 1800.  Checks x-ray shows mild pulmonary edema with cardiomegaly.  Review of Systems:   Pt denies any fevers, chills, nausea, vomiting, diarrhea, constipation, abdominal pain, cough, wheezing, palpitations, headache, vision changes, lightheadedness, dizziness, melena, rectal bleeding.  Review of systems are otherwise negative  Past Medical History:  Diagnosis Date  . Anemia   . Anxiety   . Blood transfusion without reported diagnosis   . CHF (congestive heart failure) (HCC)    a. EF 25% by echo in 07/2019  . Chronic kidney disease   . Depression   . Diabetes mellitus without complication ([place])   . End stage renal disease ([place])    M/W/F [facility]  . Hyperlipidemia   . Neuropathy   .  Peripheral vascular disease ([place])   . PTSD (post-traumatic stress disorder)    Past Surgical History:  Procedure Laterality Date  . A/V FISTULAGRAM N/A 10/09/2018   Procedure: A/V FISTULAGRAM;  Surgeon: Serafina Mitchell, MD;  Location: [place] [facility];  Service: Cardiovascular;  Laterality: N/A;  . AV FISTULA PLACEMENT Left 09/22/2016   Procedure: CREATION OF LEFT ARM ARTERIOVENOUS (AV) FISTULA;  Surgeon: Waynetta Sandy, MD;  Location: [place];  Service: Vascular;  Laterality: Left;  . AV FISTULA PLACEMENT Left 10/31/2017   Procedure: INSERTION OF ARTERIOVENOUS (AV) GORE-TEX 4-11mm STETCH GRAFT LEFT ARM;  Surgeon: Waynetta Sandy, MD;  Location: [place];  Service: Vascular;  Laterality: Left;  . BASCILIC VEIN TRANSPOSITION Left 08/17/2017   Procedure: SECOND STAGE BASILIC VEIN TRANSPOSITION LEFT ARM;  Surgeon: Waynetta Sandy, MD;  Location: [facility];  Service: Vascular;  Laterality: Left;  . BELOW KNEE LEG AMPUTATION Right   . CARDIOVERSION N/A 02/19/2019   Procedure: CARDIOVERSION;  Surgeon: [name], MD;  Location: [facility] ENDOSCOPY;  Service: Cardiovascular;  Laterality: N/A;  . CHOLECYSTECTOMY    . FOOT SURGERY    . IR FLUORO GUIDE CV LINE RIGHT  05/16/2016  . IR FLUORO GUIDE CV LINE RIGHT  02/16/2019  . IR REMOVAL TUN CV CATH W/O FL  05/16/2016  . IR REMOVAL TUN CV CATH W/O FL  02/19/2019  . IR THROMBECTOMY AV FISTULA W/THROMBOLYSIS/PTA INC/SHUNT/IMG LEFT Left 11/09/2018  . IR THROMBECTOMY AV FISTULA W/THROMBOLYSIS/PTA  INC/SHUNT/IMG LEFT Left 06/22/2019  . IR US GUIDE VASC ACCESS LEFT  11/09/2018  . IR US GUIDE VASC ACCESS LEFT  06/22/2019  . IR US GUIDE VASC ACCESS RIGHT  05/16/2016  . IR US GUIDE VASC ACCESS RIGHT  02/16/2019  . PERIPHERAL VASCULAR BALLOON ANGIOPLASTY  10/09/2018   Procedure: PERIPHERAL VASCULAR BALLOON ANGIOPLASTY;  Surgeon: Serafina Mitchell, MD;  Location: [place] [facility];  Service: Cardiovascular;;  . TEE WITHOUT CARDIOVERSION N/A 02/19/2019    Procedure: TRANSESOPHAGEAL ECHOCARDIOGRAM (TEE);  Surgeon: [name], MD;  Location: [facility] ENDOSCOPY;  Service: Cardiovascular;  Laterality: N/A;   Social History:  reports that he has been smoking. He has never used smokeless tobacco. He reports that he does not drink alcohol and does not use drugs. Patient lives at home  Allergies  Allergen Reactions  . Tape Other (See Comments)    Pulls skin off    Family History  Problem Relation Age of Onset  . Cancer Mother        lung  . Diabetes Mother   . Heart attack Father   . Diabetes Father   . Diabetes Sister       Prior to Admission medications   Medication Sig Start Date End Date Taking? Authorizing Provider  apixaban (ELIQUIS) 5 MG TABS tablet Take 1 tablet (5 mg total) by mouth 2 (two) times daily. 08/23/19  Yes Strader, Fransisco Hertz, PA-C  aspirin 81 MG chewable tablet Chew 81 mg by mouth daily.   Yes [provider]  atorvastatin (LIPITOR) 10 MG tablet Take 1 tablet (10 mg total) by mouth every evening. 08/03/19  Yes Barton Dubois, MD  calcium acetate (PHOSLO) 667 MG capsule Take 1 capsule (667 mg total) by mouth 3 (three) times daily with meals. 02/21/19  Yes Donnamae Jude, MD  calcium carbonate (TUMS - DOSED IN MG ELEMENTAL CALCIUM) 500 MG chewable tablet Chew 4 tablets (800 mg of elemental calcium total) by mouth 2 (two) times daily. 08/03/19  Yes Barton Dubois, MD  carvedilol (COREG) 3.125 MG tablet Take 1 tablet (3.125 mg total) by mouth 2 (two) times daily with a meal. 08/23/19  Yes Strader, [place] M, PA-C  LANTUS SOLOSTAR 100 UNIT/ML Solostar Pen Inject 10 Units into the skin at bedtime.  01/18/19  Yes [provider]  lidocaine-prilocaine (EMLA) cream Apply 1 application topically See admin instructions. Applied three times weekly at dialysis on MWF 05/06/19  Yes [provider]  multivitamin (RENA-VIT) TABS tablet Take 1 tablet by mouth daily. 08/04/18  Yes [provider]  patiromer  (VELTASSA) 8.4 g packet Take 1 packet (8.4 g total) by mouth 3 (three) times a week. On Tuesday, Thursday and Saturday 06/10/19  Yes Kathie Dike, MD    Physical Exam: BP (!) 152/86 (BP Location: Right Arm)   Pulse 98   Temp 99 F (37.2 C) (Oral)   Resp (!) 26   SpO2 94%   . General: Middle-age male. Awake and alert and oriented x3. No acute cardiopulmonary distress.  [name] HEENT: Normocephalic atraumatic.  Right and left ears normal in appearance.  Pupils equal, round, reactive to light. Extraocular muscles are intact. Sclerae anicteric and noninjected.  Moist mucosal membranes. No mucosal lesions.  . Neck: Neck supple without lymphadenopathy. No carotid bruits. No masses palpated.  . Cardiovascular: Regular rate with normal S1-S2 sounds. No murmurs, rubs, gallops auscultated. No JVD.  [name] Respiratory: Rales in bases bilaterally.  No accessory muscle use. . Abdomen: Soft, nontender,  nondistended. Active bowel sounds. No masses or hepatosplenomegaly  . Skin: No rashes, lesions, or ulcerations.  Dry, warm to touch. 2+ dorsalis pedis and radial pulses. . Musculoskeletal: Right BKA.  No calf or leg pain. All major joints not erythematous nontender.  No upper or lower joint deformation.  Good ROM.  No contractures  . Psychiatric: Intact judgment and insight. Pleasant and cooperative. . Neurologic: No focal neurological deficits. Strength is 5/5 and symmetric in upper and lower extremities.  Cranial nerves II through XII are grossly intact.           Labs on Admission: I have personally reviewed following labs and imaging studies  CBC: Recent Labs  Lab 12/13/19 1322  WBC 13.9*  NEUTROABS 11.8*  HGB 10.6*  HCT 31.7*  MCV 104.3*  PLT 341   Basic Metabolic Panel: Recent Labs  Lab 12/13/19 1322  NA 137  K 6.6*  CL 99  CO2 21*  GLUCOSE 69*  BUN 68*  CREATININE 12.12*  CALCIUM 6.6*   GFR: CrCl cannot be calculated (Unknown ideal weight.). Liver Function Tests: No results for  input(s): AST, ALT, ALKPHOS, BILITOT, PROT, ALBUMIN in the last 168 hours. No results for input(s): LIPASE, AMYLASE in the last 168 hours. No results for input(s): AMMONIA in the last 168 hours. Coagulation Profile: No results for input(s): INR, PROTIME in the last 168 hours. Cardiac Enzymes: No results for input(s): CKTOTAL, CKMB, CKMBINDEX, TROPONINI in the last 168 hours. BNP (last 3 results) No results for input(s): PROBNP in the last 8760 hours. HbA1C: No results for input(s): HGBA1C in the last 72 hours. CBG: Recent Labs  Lab 12/13/19 1602  GLUCAP 125*   Lipid Profile: No results for input(s): CHOL, HDL, LDLCALC, TRIG, CHOLHDL, LDLDIRECT in the last 72 hours. Thyroid Function Tests: No results for input(s): TSH, T4TOTAL, FREET4, T3FREE, THYROIDAB in the last 72 hours. Anemia Panel: No results for input(s): VITAMINB12, FOLATE, FERRITIN, TIBC, IRON, RETICCTPCT in the last 72 hours. Urine analysis: No results found for: COLORURINE, APPEARANCEUR, LABSPEC, PHURINE, GLUCOSEU, HGBUR, BILIRUBINUR, KETONESUR, PROTEINUR, UROBILINOGEN, NITRITE, LEUKOCYTESUR Sepsis Labs: @LABRCNTIP (procalcitonin:4,lacticidven:4) ) Recent Results (from the past 240 hour(s))  Respiratory Panel by RT PCR (Flu A&B, Covid) - Nasopharyngeal Swab     Status: None   Collection Time: 12/13/19 12:56 PM   Specimen: Nasopharyngeal Swab  Result Value Ref Range Status   SARS Coronavirus 2 by RT PCR NEGATIVE NEGATIVE Final    Comment: (NOTE) SARS-CoV-2 target nucleic acids are NOT DETECTED.  The SARS-CoV-2 RNA is generally detectable in upper respiratoy specimens during the acute phase of infection. The lowest concentration of SARS-CoV-2 viral copies this assay can detect is 131 copies/mL. A negative result does not preclude SARS-Cov-2 infection and should not be used as the sole basis for treatment or other patient management decisions. A negative result may occur with  improper specimen collection/handling,  submission of specimen other than nasopharyngeal swab, presence of viral mutation(s) within the areas targeted by this assay, and inadequate number of viral copies (<131 copies/mL). A negative result must be combined with clinical observations, patient history, and epidemiological information. The expected result is Negative.  Fact Sheet for Patients:  PinkCheek.be  Fact Sheet for Healthcare Providers:  GravelBags.it  This test is no t yet approved or cleared by the [place] FDA and  has been authorized for detection and/or diagnosis of SARS-CoV-2 by FDA under an Emergency Use Authorization (EUA). This EUA will remain  in effect (meaning this test can be  used) for the duration of the COVID-19 declaration under Section 564(b)(1) of the Act, 21 U.S.C. section 360bbb-3(b)(1), unless the authorization is terminated or revoked sooner.     Influenza A by PCR NEGATIVE NEGATIVE Final   Influenza B by PCR NEGATIVE NEGATIVE Final    Comment: (NOTE) The Xpert Xpress SARS-CoV-2/FLU/RSV assay is intended as an aid in  the diagnosis of influenza from Nasopharyngeal swab specimens and  should not be used as a sole basis for treatment. Nasal washings and  aspirates are unacceptable for Xpert Xpress SARS-CoV-2/FLU/RSV  testing.  Fact Sheet for Patients: PinkCheek.be  Fact Sheet for Healthcare Providers: GravelBags.it  This test is not yet approved or cleared by the [place] FDA and  has been authorized for detection and/or diagnosis of SARS-CoV-2 by  FDA under an Emergency Use Authorization (EUA). This EUA will remain  in effect (meaning this test can be used) for the duration of the  Covid-19 declaration under Section 564(b)(1) of the Act, 21  U.S.C. section 360bbb-3(b)(1), unless the authorization is  terminated or revoked. Performed at [facility], [address], [place], [place] [address]      Radiological Exams on Admission: DG Chest [facility] 1 View  Result Date: 12/13/2019 CLINICAL DATA:  Shortness of breath and cough. EXAM: PORTABLE CHEST 1 VIEW COMPARISON:  08/09/2019 FINDINGS: Mild cardiac enlargement. Diffuse pulmonary vascular congestion. No pleural effusion or overt edema. No airspace densities identified. IMPRESSION: Cardiac enlargement and pulmonary vascular congestion. Electronically Signed   By: Kerby Moors M.D.   On: 12/13/2019 11:41    EKG: Independently reviewed.  Sinus tachycardia, left atrial enlargement, left bundle branch block.  No acute ST changes.  Assessment/Plan: Active Problems:   Type 2 diabetes with nephropathy (HCC)   S/P below knee amputation, right (HCC)   Hyperkalemia   ESRD on hemodialysis (HCC)   Atrial fibrillation, chronic (HCC)   Hyperparathyroidism ([place])   Acute congestive heart failure ([place])    This patient was discussed with the ED physician, including pertinent vitals, physical exam findings, labs, and imaging.  We also discussed care given by the ED provider.  1. Acute congestive heart failure Telemetry monitoring Strict I/O Daily Weights Diuresis: Dialysis Potassium: Hyperkalemic Echo cardiac exam tomorrow Repeat BMP tomorrow 2. Hyperkalemia a. Patient to go to emergent dialysis b. Calcium gluconate given.  Patient also given insulin and amp of dextrose 3. End-stage renal disease on dialysis 4. A. Fib a. Currently in sinus rhythm b. Chronic anticoagulation 5. Type 2 diabetes with nephropathy a. Continue long-acting insulin b. Sliding scale insulin CBGs before meals and nightly 6. Status post BKA  DVT prophylaxis: On Eliquis Consultants: Nephrology Code Status: Full code Family Communication: None Disposition Plan: Patient should be able to return home   Brendan Mainland, DO

## 2019-12-13 NOTE — ED Triage Notes (Addendum)
Pt goes to dialysis MWF. Pt did not go to dialysis as he was didn't feel like it. And was suppose to go today at 1245 but did not feel he can wait until 1245 due to SOB.sats 83 % on RA

## 2019-12-13 NOTE — ED Provider Notes (Addendum)
[facility] EMERGENCY DEPARTMENT Provider Note   CSN: [id number] Arrival date & time: 12/13/19  2876     History Chief Complaint  Patient presents with  . Shortness of Breath    Brendan Campbell is a 49 y.o. male.  Patient brought in by EMS.  For shortness of breath.  Oxygen saturations were 83% on room air.  Patient states that he started to up with a cough and shortness of breath yesterday.  Patient has not had any of the Covid vaccines.  Patient is a dialysis patient.  Normally dialyzed Monday Wednesday Friday.  Last time he was dialyzed was Monday.  He missed Wednesday and is also missing today.  Patient's been receiving dialysis for the past 3 years.  He is on a transplant list.  Patient is on Eliquis as well.        Past Medical History:  Diagnosis Date  . Anemia   . Anxiety   . Blood transfusion without reported diagnosis   . CHF (congestive heart failure) (HCC)    a. EF 25% by echo in 07/2019  . Chronic kidney disease   . Depression   . Diabetes mellitus without complication ([place])   . End stage renal disease ([place])    M/W/F [facility]  . Hyperlipidemia   . Neuropathy   . Peripheral vascular disease ([place])   . PTSD (post-traumatic stress disorder)     Patient Active Problem List   Diagnosis Date Noted  . Macrocytic anemia 09/25/2019  . Paresthesia of skin 09/24/2019  . Hypocalcemia   . Atrial fibrillation, chronic ([place])   . Hyperparathyroidism ([place])   . Left sided numbness 08/02/2019  . Cardiac arrest ([place]) 06/07/2019  . Atrial flutter ([place])   . CHF (congestive heart failure) ([place])   . Neuropathy   . Peripheral vascular disease ([place])   . Abnormal EKG   . Leukocytosis   . Atypical pneumonia   . ESRD needing dialysis ([place])   . Acute pulmonary edema (HCC)   . ESRD on hemodialysis ([place])   . Hyperkalemia 10/05/2018  . Weakness 10/05/2018  . Chest pain 05/10/2016  . 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 Spondylosis of lumbar spine  03/08/2016  . Lumbar radiculopathy 03/08/2016  . S/P below knee amputation, right ([place]) 03/05/2015  . Type 2 diabetes with nephropathy ([place]) 03/03/2015  . HLD (hyperlipidemia) 03/03/2015    Past Surgical History:  Procedure Laterality Date  . A/V FISTULAGRAM N/A 10/09/2018   Procedure: A/V FISTULAGRAM;  Surgeon: Serafina Mitchell, MD;  Location: [place] CV LAB;  Service: Cardiovascular;  Laterality: N/A;  . AV FISTULA PLACEMENT Left 09/22/2016   Procedure: CREATION OF LEFT ARM ARTERIOVENOUS (AV) FISTULA;  Surgeon: Waynetta Sandy, MD;  Location: [place];  Service: Vascular;  Laterality: Left;  . AV FISTULA PLACEMENT Left 10/31/2017   Procedure: INSERTION OF ARTERIOVENOUS (AV) GORE-TEX 4-28mm STETCH GRAFT LEFT ARM;  Surgeon: Waynetta Sandy, MD;  Location: [place];  Service: Vascular;  Laterality: Left;  . BASCILIC VEIN TRANSPOSITION Left 08/17/2017   Procedure: SECOND STAGE BASILIC VEIN TRANSPOSITION LEFT ARM;  Surgeon: Waynetta Sandy, MD;  Location: [place];  Service: Vascular;  Laterality: Left;  . BELOW KNEE LEG AMPUTATION Right   . CARDIOVERSION N/A 02/19/2019   Procedure: CARDIOVERSION;  Surgeon: [name], MD;  Location: [facility] ENDOSCOPY;  Service: Cardiovascular;  Laterality: N/A;  . CHOLECYSTECTOMY    . FOOT SURGERY    . IR  FLUORO GUIDE CV LINE RIGHT  05/16/2016  . IR FLUORO GUIDE CV LINE RIGHT  02/16/2019  . IR REMOVAL TUN CV CATH W/O FL  05/16/2016  . IR REMOVAL TUN CV CATH W/O FL  02/19/2019  . IR THROMBECTOMY AV FISTULA W/THROMBOLYSIS/PTA INC/SHUNT/IMG LEFT Left 11/09/2018  . IR THROMBECTOMY AV FISTULA W/THROMBOLYSIS/PTA INC/SHUNT/IMG LEFT Left 06/22/2019  . IR US GUIDE VASC ACCESS LEFT  11/09/2018  . IR US GUIDE VASC ACCESS LEFT  06/22/2019  . IR US GUIDE VASC ACCESS RIGHT  05/16/2016  . IR US GUIDE VASC ACCESS RIGHT  02/16/2019  . PERIPHERAL VASCULAR BALLOON ANGIOPLASTY  10/09/2018   Procedure: PERIPHERAL VASCULAR BALLOON ANGIOPLASTY;  Surgeon: Serafina Mitchell,  MD;  Location: [place] CV LAB;  Service: Cardiovascular;;  . TEE WITHOUT CARDIOVERSION N/A 02/19/2019   Procedure: TRANSESOPHAGEAL ECHOCARDIOGRAM (TEE);  Surgeon: [name], MD;  Location: [facility] ENDOSCOPY;  Service: Cardiovascular;  Laterality: N/A;       Family History  Problem Relation Age of Onset  . Cancer Mother        lung  . Diabetes Mother   . Heart attack Father   . Diabetes Father   . Diabetes Sister     Social History   Tobacco Use  . Smoking status: Current Some Day Smoker    Last attempt to quit: 09/03/2006    Years since quitting: 13.2  . Smokeless tobacco: Never Used  Vaping Use  . Vaping Use: Never used  Substance Use Topics  . Alcohol use: No  . Drug use: No    Home Medications Prior to Admission medications   Medication Sig Start Date End Date Taking? Authorizing Provider  apixaban (ELIQUIS) 5 MG TABS tablet Take 1 tablet (5 mg total) by mouth 2 (two) times daily. 08/23/19   Strader, Fransisco Hertz, PA-C  aspirin 81 MG chewable tablet Chew 81 mg by mouth daily.    [provider]  atorvastatin (LIPITOR) 10 MG tablet Take 1 tablet (10 mg total) by mouth every evening. 08/03/19   Barton Dubois, MD  calcium acetate (PHOSLO) 667 MG capsule Take 1 capsule (667 mg total) by mouth 3 (three) times daily with meals. 02/21/19   Donnamae Jude, MD  calcium carbonate (TUMS - DOSED IN MG ELEMENTAL CALCIUM) 500 MG chewable tablet Chew 4 tablets (800 mg of elemental calcium total) by mouth 2 (two) times daily. 08/03/19   Barton Dubois, MD  carvedilol (COREG) 3.125 MG tablet Take 1 tablet (3.125 mg total) by mouth 2 (two) times daily with a meal. 08/23/19   Strader, [place] M, PA-C  LANTUS SOLOSTAR 100 UNIT/ML Solostar Pen Inject 10 Units into the skin at bedtime.  01/18/19   [provider]  lidocaine-prilocaine (EMLA) cream Apply 1 application topically See admin instructions. Applied three times weekly at dialysis on MWF 05/06/19   [provider]  multivitamin (RENA-VIT) TABS tablet Take 1 tablet by mouth daily. 08/04/18   [provider]  patiromer (VELTASSA) 8.4 g packet Take 1 packet (8.4 g total) by mouth 3 (three) times a week. On Tuesday, Thursday and Saturday 06/10/19   Kathie Dike, MD    Allergies    Tape  Review of Systems   Review of Systems  Constitutional: Positive for fatigue. Negative for chills and fever.  HENT: Negative for rhinorrhea and sore throat.   Eyes: Negative for visual disturbance.  Respiratory: Positive for cough and shortness of breath.   Cardiovascular: Negative for chest pain and  leg swelling.  Gastrointestinal: Negative for abdominal pain, diarrhea, nausea and vomiting.  Genitourinary: Negative for dysuria.  Musculoskeletal: Negative for back pain and neck pain.  Skin: Negative for rash.  Neurological: Negative for dizziness, light-headedness and headaches.  Hematological: Does not bruise/bleed easily.  Psychiatric/Behavioral: Negative for confusion.    Physical Exam Updated Vital Signs BP (!) 158/90   Pulse (!) 107   Temp 99 F (37.2 C) (Oral)   Resp (!) 28   SpO2 95%   Physical Exam Vitals and nursing note reviewed.  Constitutional:      Appearance: Normal appearance. He is well-developed.  HENT:     Head: Normocephalic and atraumatic.  Eyes:     Extraocular Movements: Extraocular movements intact.     Conjunctiva/sclera: Conjunctivae normal.     Pupils: Pupils are equal, round, and reactive to light.  Cardiovascular:     Rate and Rhythm: Regular rhythm. Tachycardia present.     Heart sounds: No murmur heard.   Pulmonary:     Effort: Pulmonary effort is normal. No respiratory distress.     Breath sounds: Normal breath sounds.  Abdominal:     Palpations: Abdomen is soft.     Tenderness: There is no abdominal tenderness.  Musculoskeletal:        General: Normal range of motion.     Cervical back: Normal range of motion and neck supple.  Skin:    General: Skin  is warm and dry.  Neurological:     General: No focal deficit present.     Mental Status: He is alert and oriented to person, place, and time.     Cranial Nerves: No cranial nerve deficit.     Sensory: No sensory deficit.     ED Results / Procedures / Treatments   Labs (all labs ordered are listed, but only abnormal results are displayed) Labs Reviewed  RESPIRATORY PANEL BY RT PCR (FLU A&B, COVID)  CBC WITH DIFFERENTIAL/PLATELET  BASIC METABOLIC PANEL  BRAIN NATRIURETIC PEPTIDE    EKG EKG Interpretation  Date/Time:  Friday December 13 2019 10:47:46 EDT Ventricular Rate:  110 PR Interval:    QRS Duration: 136 QT Interval:  376 QTC Calculation: 509 R Axis:   -11 Text Interpretation: Sinus tachycardia Probable left atrial enlargement Left bundle branch block Confirmed by Fredia Sorrow [phone]) on 12/13/2019 11:03:54 AM   Radiology DG Chest Port 1 View  Result Date: 12/13/2019 CLINICAL DATA:  Shortness of breath and cough. EXAM: PORTABLE CHEST 1 VIEW COMPARISON:  08/09/2019 FINDINGS: Mild cardiac enlargement. Diffuse pulmonary vascular congestion. No pleural effusion or overt edema. No airspace densities identified. IMPRESSION: Cardiac enlargement and pulmonary vascular congestion. Electronically Signed   By: Kerby Moors M.D.   On: 12/13/2019 11:41    Procedures Procedures (including critical care time)  CRITICAL CARE Performed by: Fredia Sorrow Total critical care time: 45 minutes Critical care time was exclusive of separately billable procedures and treating other patients. Critical care was necessary to treat or prevent imminent or life-threatening deterioration. Critical care was time spent personally by me on the following activities: development of treatment plan with patient and/or surrogate as well as nursing, discussions with consultants, evaluation of patient's response to treatment, examination of patient, obtaining history from patient or surrogate,  ordering and performing treatments and interventions, ordering and review of laboratory studies, ordering and review of radiographic studies, pulse oximetry and re-evaluation of patient's condition.   Medications Ordered in ED Medications - No data to display  ED Course  I have reviewed the triage vital signs and the nursing notes.  Pertinent labs & imaging results that were available during my care of the patient were reviewed by me and considered in my medical decision making (see chart for details).    MDM Rules/Calculators/A&P                          Patient with oxygen requirement.  Chest x-ray without evidence of any significant pulmonary edema.  Also some concerned about Covid infection.  Patient is EKG and cardiac monitoring shows a sinus tachycardia with heart rate in the low 100s.  Temp was 99.  Blood pressure was elevated at 168/84.  Respirations up at 26%.  On 2 L of oxygen patient satting 91%  Covid testing is negative.  As well as influenza testing.  Electrolytes still pending.  Patient with critical potassium was 6.6.  Order set used for moderate hyperkalemia for him to get dextrose and to get insulin.  Also placed a call in to nephrology for dialysis.  And patient will probably require admission by the hospitalist service.  Patient's oxygen saturations on 2 L are in the low 90s.  The patient does not have oxygen at home so he is also showing evidence of some volume overload.  BNP was elevated.  EKG without significant EKG changes may be a little more peaking of the T waves.  Patient's lungs clear no wheezing.  Final Clinical Impression(s) / ED Diagnoses Final diagnoses:  SOB (shortness of breath)  ESRD on dialysis [facility])    Rx / DC Orders ED Discharge Orders    None       Fredia Sorrow, MD 12/13/19 1413    Fredia Sorrow, MD 12/13/19 1444

## 2019-12-14 ENCOUNTER — Encounter (HOSPITAL_COMMUNITY): Payer: Self-pay | Admitting: Family Medicine

## 2019-12-14 DIAGNOSIS — N186 End stage renal disease: Secondary | ICD-10-CM | POA: Diagnosis not present

## 2019-12-14 DIAGNOSIS — Z992 Dependence on renal dialysis: Secondary | ICD-10-CM

## 2019-12-14 DIAGNOSIS — I5041 Acute combined systolic (congestive) and diastolic (congestive) heart failure: Secondary | ICD-10-CM | POA: Diagnosis not present

## 2019-12-14 LAB — BASIC METABOLIC PANEL
Anion gap: 12 (ref 5–15)
BUN: 31 mg/dL — ABNORMAL HIGH (ref 6–20)
CO2: 28 mmol/L (ref 22–32)
Calcium: 7.1 mg/dL — ABNORMAL LOW (ref 8.9–10.3)
Chloride: 94 mmol/L — ABNORMAL LOW (ref 98–111)
Creatinine, Ser: 7.01 mg/dL — ABNORMAL HIGH (ref 0.61–1.24)
GFR, Estimated: 9 mL/min — ABNORMAL LOW (ref >60)
Glucose, Bld: 96 mg/dL (ref 70–99)
Potassium: 3.7 mmol/L (ref 3.5–5.1)
Sodium: 134 mmol/L — ABNORMAL LOW (ref 135–145)

## 2019-12-14 LAB — HEMOGLOBIN A1C
Hgb A1c MFr Bld: 5.1 % (ref 4.8–5.6)
Mean Plasma Glucose: 99.67 mg/dL

## 2019-12-14 LAB — HEPATITIS B SURFACE ANTIGEN

## 2019-12-14 LAB — CBG MONITORING, ED
Glucose-Capillary: 94 mg/dL (ref 70–99)
Glucose-Capillary: 65 mg/dL — ABNORMAL LOW (ref 70–99)
Glucose-Capillary: 113 mg/dL — ABNORMAL HIGH (ref 70–99)

## 2019-12-14 LAB — HIV ANTIBODY (ROUTINE TESTING W REFLEX): HIV Screen 4th Generation wRfx: NONREACTIVE

## 2019-12-14 MED ORDER — CALCIUM CARBONATE ANTACID 500 MG PO CHEW: 2.0000 | CHEWABLE_TABLET | Freq: Two times a day (BID) | ORAL | Status: DC

## 2019-12-14 NOTE — ED Notes (Signed)
Pt given meal tray.

## 2019-12-14 NOTE — ED Notes (Signed)
Pt given sprite and oatmeal cookie- pt ate and drank all of this

## 2019-12-14 NOTE — ED Notes (Signed)
Pt was given grape juice for the low sugar.

## 2019-12-14 NOTE — ED Notes (Signed)
Pt reports he needs transport home  Call to [facility], Tim who states he will provide a cab voucher   He is advised pt will be in lobby

## 2019-12-14 NOTE — Treatment Plan (Signed)
Patient much improved per Dr. Manuella Ghazi and stable for DC now s/p HD. Patient okay for DC from our standpoint. Resume outpt hd on Monday. Encourage compliance.

## 2019-12-14 NOTE — Discharge Summary (Signed)
Physician Discharge Summary  CASH DUCE OXB:[id number] DOB: [birth year] DOA: 12/13/2019  PCP: Monico Blitz, MD  Admit date: 12/13/2019  Discharge date: 12/14/2019  Admitted From:Home  Disposition:  Home  Recommendations for Outpatient Follow-up:  1. Follow up with PCP in 1-2 weeks 2. Continue prior home medications 3. Continue routine hemodialysis as prior on MWF  Home Health: None  Equipment/Devices: None  Discharge Condition: Stable  CODE STATUS: Full  Diet recommendation: Heart Healthy/carb modified  Brief/Interim Summary: Per HPI: Brendan Campbell is a 49 y.o. male with a history of diabetes, congestive heart failure with an EF of 25% and grade 1 diastolic dysfunction on echo 07/2019, diabetic neuropathy, status post right BKA, end-stage renal disease on dialysis Monday Wednesday Friday.  Patient last went to dialysis on Monday.  He did not go Wednesday due to sleeping and because he took Tylenol PM for with spurring in his neck and pain.  He was going to go this morning, however he had shortness of breath when he woke up.  Because of the shortness of breath, he tried to take some medicine to improve it, but it did not help.  Breathing worse when he was lying down and improved with sitting up.  He came to the hospital for evaluation and was found to be mildly hypoxic.   Emergency Department Course: Oxygen saturation on arrival 91.  Patient slightly tachycardic.  Labs are grossly abnormal with a potassium of 6.6 creatinine of 12.12 BUN 68 BNP of 1800.  Checks x-ray shows mild pulmonary edema with cardiomegaly.  -Patient was admitted with acute CHF as well as hyperkalemia in the setting of volume overload with missed hemodialysis sessions.  He was given some calcium gluconate as well as insulin and amp of D50 for stabilization of his hyperkalemia and underwent urgent hemodialysis on the evening of 11/5 with 3 L of fluid removed.  His potassium has now normalized and he has no further  shortness of breath or any other symptoms.  He states that he has neck and back pain which limit him from participating in hemodialysis.  He states that he will go to dialysis as scheduled on Monday of next week and adjust his pain medication schedule accordingly to allow him to participate without missing sessions.  No other acute events noted throughout the course of this brief admission.  Case discussed with nephrology on the morning of 11/6 and he is noted to be stable for discharge.  Discharge Diagnoses:  Active Problems:   Type 2 diabetes with nephropathy (HCC)   S/P below knee amputation, right (HCC)   Hyperkalemia   ESRD on hemodialysis (HCC)   Atrial fibrillation, chronic (HCC)   Hyperparathyroidism ([place])   Acute congestive heart failure ([place])  Principal discharge diagnosis: Acute systolic CHF exacerbation in the setting of volume overload with missed hemodialysis along with associated hyperkalemia.  Discharge Instructions  Discharge Instructions    Diet - low sodium heart healthy   Complete by: As directed    Increase activity slowly   Complete by: As directed      Allergies as of 12/14/2019      Reactions   Tape Other (See Comments)   Pulls skin off      Medication List    TAKE these medications   apixaban 5 MG Tabs tablet Commonly known as: ELIQUIS Take 1 tablet (5 mg total) by mouth 2 (two) times daily.   aspirin 81 MG chewable tablet Chew 81 mg by mouth daily.  atorvastatin 10 MG tablet Commonly known as: LIPITOR Take 1 tablet (10 mg total) by mouth every evening.   calcium acetate 667 MG capsule Commonly known as: PHOSLO Take 1 capsule (667 mg total) by mouth 3 (three) times daily with meals.   calcium carbonate 500 MG chewable tablet Commonly known as: TUMS - dosed in mg elemental calcium Chew 4 tablets (800 mg of elemental calcium total) by mouth 2 (two) times daily.   carvedilol 3.125 MG tablet Commonly known as: Coreg Take 1 tablet (3.125 mg  total) by mouth 2 (two) times daily with a meal.   Lantus SoloStar 100 UNIT/ML Solostar Pen Generic drug: insulin glargine Inject 10 Units into the skin at bedtime.   lidocaine-prilocaine cream Commonly known as: EMLA Apply 1 application topically See admin instructions. Applied three times weekly at dialysis on MWF   multivitamin Tabs tablet Take 1 tablet by mouth daily.   Veltassa 8.4 g packet Generic drug: patiromer Take 1 packet (8.4 g total) by mouth 3 (three) times a week. On Tuesday, Thursday and Saturday       Follow-up Information    Monico Blitz, MD Follow up in 1 week(s).   Specialty: Internal Medicine Contact information: [address] [place] [address] [phone]              Allergies  Allergen Reactions  . Tape Other (See Comments)    Pulls skin off    Consultations:  Nephrology   Procedures/Studies: DG Chest Port 1 View  Result Date: 12/13/2019 CLINICAL DATA:  Shortness of breath and cough. EXAM: PORTABLE CHEST 1 VIEW COMPARISON:  08/09/2019 FINDINGS: Mild cardiac enlargement. Diffuse pulmonary vascular congestion. No pleural effusion or overt edema. No airspace densities identified. IMPRESSION: Cardiac enlargement and pulmonary vascular congestion. Electronically Signed   By: Kerby Moors M.D.   On: 12/13/2019 11:41     Discharge Exam: Vitals:   12/14/19 1030 12/14/19 1100  BP: (!) 120/54 115/71  Pulse: 85 89  Resp: (!) 22 (!) 25  Temp:    SpO2: 90% 91%   Vitals:   12/14/19 0930 12/14/19 1000 12/14/19 1030 12/14/19 1100  BP: (!) 158/86 (!) 152/92 (!) 120/54 115/71  Pulse: 89 92 85 89  Resp: 17 19 (!) 22 (!) 25  Temp:      TempSrc:      SpO2: 92% 94% 90% 91%    General: Pt is alert, awake, not in acute distress Cardiovascular: RRR, S1/S2 +, no rubs, no gallops Respiratory: CTA bilaterally, no wheezing, no rhonchi Abdominal: Soft, NT, ND, bowel sounds + Extremities: BKA to right side with prosthetic    The results of  significant diagnostics from this hospitalization (including imaging, microbiology, ancillary and laboratory) are listed below for reference.     Microbiology: Recent Results (from the past 240 hour(s))  Respiratory Panel by RT PCR (Flu A&B, Covid) - Nasopharyngeal Swab     Status: None   Collection Time: 12/13/19 12:56 PM   Specimen: Nasopharyngeal Swab  Result Value Ref Range Status   SARS Coronavirus 2 by RT PCR NEGATIVE NEGATIVE Final    Comment: (NOTE) SARS-CoV-2 target nucleic acids are NOT DETECTED.  The SARS-CoV-2 RNA is generally detectable in upper respiratoy specimens during the acute phase of infection. The lowest concentration of SARS-CoV-2 viral copies this assay can detect is 131 copies/mL. A negative result does not preclude SARS-Cov-2 infection and should not be used as the sole basis for treatment or other patient management decisions. A negative  result may occur with  improper specimen collection/handling, submission of specimen other than nasopharyngeal swab, presence of viral mutation(s) within the areas targeted by this assay, and inadequate number of viral copies (<131 copies/mL). A negative result must be combined with clinical observations, patient history, and epidemiological information. The expected result is Negative.  Fact Sheet for Patients:  PinkCheek.be  Fact Sheet for Healthcare Providers:  GravelBags.it  This test is no t yet approved or cleared by the [place] FDA and  has been authorized for detection and/or diagnosis of SARS-CoV-2 by FDA under an Emergency Use Authorization (EUA). This EUA will remain  in effect (meaning this test can be used) for the duration of the COVID-19 declaration under Section 564(b)(1) of the Act, 21 U.S.C. section 360bbb-3(b)(1), unless the authorization is terminated or revoked sooner.     Influenza A by PCR NEGATIVE NEGATIVE Final   Influenza B by  PCR NEGATIVE NEGATIVE Final    Comment: (NOTE) The Xpert Xpress SARS-CoV-2/FLU/RSV assay is intended as an aid in  the diagnosis of influenza from Nasopharyngeal swab specimens and  should not be used as a sole basis for treatment. Nasal washings and  aspirates are unacceptable for Xpert Xpress SARS-CoV-2/FLU/RSV  testing.  Fact Sheet for Patients: PinkCheek.be  Fact Sheet for Healthcare Providers: GravelBags.it  This test is not yet approved or cleared by the [place] FDA and  has been authorized for detection and/or diagnosis of SARS-CoV-2 by  FDA under an Emergency Use Authorization (EUA). This EUA will remain  in effect (meaning this test can be used) for the duration of the  Covid-19 declaration under Section 564(b)(1) of the Act, 21  U.S.C. section 360bbb-3(b)(1), unless the authorization is  terminated or revoked. Performed at [facility], [address] [place], [place] [address]      Labs: BNP (last 3 results) Recent Labs    05/16/19 0814 06/07/19 1125 12/13/19 1322  BNP 928.0* 3,441.0* 5,427.0*   Basic Metabolic Panel: Recent Labs  Lab 12/13/19 1322 12/14/19 0550  NA 137 134*  K 6.6* 3.7  CL 99 94*  CO2 21* 28  GLUCOSE 69* 96  BUN 68* 31*  CREATININE 12.12* 7.01*  CALCIUM 6.6* 7.1*   Liver Function Tests: No results for input(s): AST, ALT, ALKPHOS, BILITOT, PROT, ALBUMIN in the last 168 hours. No results for input(s): LIPASE, AMYLASE in the last 168 hours. No results for input(s): AMMONIA in the last 168 hours. CBC: Recent Labs  Lab 12/13/19 1322  WBC 13.9*  NEUTROABS 11.8*  HGB 10.6*  HCT 31.7*  MCV 104.3*  PLT 247   Cardiac Enzymes: No results for input(s): CKTOTAL, CKMB, CKMBINDEX, TROPONINI in the last 168 hours. BNP: Invalid input(s): POCBNP CBG: Recent Labs  Lab 12/13/19 1602 12/13/19 2340 12/14/19 0536 12/14/19 0755 12/14/19 0841  GLUCAP 125* 58* 94 65* 113*    D-Dimer No results for input(s): DDIMER in the last 72 hours. Hgb A1c No results for input(s): HGBA1C in the last 72 hours. Lipid Profile No results for input(s): CHOL, HDL, LDLCALC, TRIG, CHOLHDL, LDLDIRECT in the last 72 hours. Thyroid function studies No results for input(s): TSH, T4TOTAL, T3FREE, THYROIDAB in the last 72 hours.  Invalid input(s): FREET3 Anemia work up No results for input(s): VITAMINB12, FOLATE, FERRITIN, TIBC, IRON, RETICCTPCT in the last 72 hours. Urinalysis No results found for: COLORURINE, APPEARANCEUR, [place], [place], GLUCOSEU, [place], [name], KETONESUR, PROTEINUR, UROBILINOGEN, NITRITE, LEUKOCYTESUR Sepsis Labs Invalid input(s): PROCALCITONIN,  WBC,  LACTICIDVEN Microbiology Recent Results (from  the past 240 hour(s))  Respiratory Panel by RT PCR (Flu A&B, Covid) - Nasopharyngeal Swab     Status: None   Collection Time: 12/13/19 12:56 PM   Specimen: Nasopharyngeal Swab  Result Value Ref Range Status   SARS Coronavirus 2 by RT PCR NEGATIVE NEGATIVE Final    Comment: (NOTE) SARS-CoV-2 target nucleic acids are NOT DETECTED.  The SARS-CoV-2 RNA is generally detectable in upper respiratoy specimens during the acute phase of infection. The lowest concentration of SARS-CoV-2 viral copies this assay can detect is 131 copies/mL. A negative result does not preclude SARS-Cov-2 infection and should not be used as the sole basis for treatment or other patient management decisions. A negative result may occur with  improper specimen collection/handling, submission of specimen other than nasopharyngeal swab, presence of viral mutation(s) within the areas targeted by this assay, and inadequate number of viral copies (<131 copies/mL). A negative result must be combined with clinical observations, patient history, and epidemiological information. The expected result is Negative.  Fact Sheet for Patients:  PinkCheek.be  Fact  Sheet for Healthcare Providers:  GravelBags.it  This test is no t yet approved or cleared by the [place] FDA and  has been authorized for detection and/or diagnosis of SARS-CoV-2 by FDA under an Emergency Use Authorization (EUA). This EUA will remain  in effect (meaning this test can be used) for the duration of the COVID-19 declaration under Section 564(b)(1) of the Act, 21 U.S.C. section 360bbb-3(b)(1), unless the authorization is terminated or revoked sooner.     Influenza A by PCR NEGATIVE NEGATIVE Final   Influenza B by PCR NEGATIVE NEGATIVE Final    Comment: (NOTE) The Xpert Xpress SARS-CoV-2/FLU/RSV assay is intended as an aid in  the diagnosis of influenza from Nasopharyngeal swab specimens and  should not be used as a sole basis for treatment. Nasal washings and  aspirates are unacceptable for Xpert Xpress SARS-CoV-2/FLU/RSV  testing.  Fact Sheet for Patients: PinkCheek.be  Fact Sheet for Healthcare Providers: GravelBags.it  This test is not yet approved or cleared by the [place] FDA and  has been authorized for detection and/or diagnosis of SARS-CoV-2 by  FDA under an Emergency Use Authorization (EUA). This EUA will remain  in effect (meaning this test can be used) for the duration of the  Covid-19 declaration under Section 564(b)(1) of the Act, 21  U.S.C. section 360bbb-3(b)(1), unless the authorization is  terminated or revoked. Performed at [facility], [address], [place], [place] [address]      Time coordinating discharge: 35 minutes  SIGNED:   Rodena Goldmann, DO [facility] 12/14/2019, 11:18 AM  If 7PM-7AM, please contact night-coverage [email or web address]

## 2019-12-29 DIAGNOSIS — R519 Headache, unspecified: Secondary | ICD-10-CM | POA: Insufficient documentation

## 2019-12-29 DIAGNOSIS — M5412 Radiculopathy, cervical region: Secondary | ICD-10-CM | POA: Insufficient documentation

## 2019-12-29 DIAGNOSIS — M79602 Pain in left arm: Secondary | ICD-10-CM | POA: Insufficient documentation

## 2019-12-29 DIAGNOSIS — M79609 Pain in unspecified limb: Secondary | ICD-10-CM | POA: Insufficient documentation

## 2020-01-09 ENCOUNTER — Encounter (HOSPITAL_COMMUNITY): Payer: Medicaid Other

## 2020-01-09 ENCOUNTER — Ambulatory Visit (HOSPITAL_COMMUNITY): Admission: RE | Admit: 2020-01-09 | Payer: Medicaid Other | Source: Ambulatory Visit

## 2020-02-13 NOTE — H&P (Signed)
Surgical History & Physical  Patient Name: Brendan Campbell DOB: Jul 01, 1970  Surgery: Cataract extraction with intraocular lens implant phacoemulsification; Right Eye  Surgeon: Baruch Goldmann MD Surgery Date:  03/02/2020 Pre-Op Date:  02/13/2020  HPI: A 42 Yr. old male patient 1. 1. The patient presents for cataract evaluation, referred by Dr. Wynetta Emery. The patient complains of difficulty when recognizing people, which began 1 years ago. Both eyes are affected, OD worse than OS. The patient describes foggy and hazy symptoms affecting their eyes/vision, patient describes it as being "milky". The complaint is associated with blurry vision, glare, halos, itching and pain pt states OD is dry and itching, and at times he gets an aching pain in OD. This is This is negatively affecting the patient's quality of life. The patient experiences no shadow, curtain or veil. The patient reports seeing some white spots, and occasional flashes of light OD. Pt had a laser procedure done in [place] years ago when seeing spots on eye. Pt states it was OS, but could have been OD (notes specify OD). HPI Completed by Dr. Baruch Goldmann  Medical History: Cataracts Diabetes - DM Type 2 High Blood Pressure Hypercholesterolemia, Back Pain, Neuropathy  Review of Systems Genitourinary Dialysis Musculoskeletal Joint Ache All recorded systems are negative except as noted above.  Social   Former smoker   Medication Vitamin D, Cholesterol medication, Blood thinner, Lantus,   Sx/Procedures Retina Laser,   Drug Allergies   NKDA  History & Physical: Heent:  Cataract, Right eye NECK: supple without bruits LUNGS: lungs clear to auscultation CV: regular rate and rhythm Abdomen: soft and non-tender  Impression & Plan: Assessment: 1.  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  DERMATOCHALASIS; Right Upper Lid, Left Upper Lid (H02.831, H02.834) 3.  Pinguecula; Both Eyes (H11.153) 4.   COMBINED FORMS AGE RELATED CATARACT; Left Eye (H25.812) 5.  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  Diabetes Type 2 No retinopathy (E11.9)  Plan: 1.  Cataract accounts for the patient's decreased vision. This visual impairment is not correctable with a tolerable change in glasses or contact lenses. Cataract surgery with an implantation of a new lens should significantly improve the visual and functional status of the patient. Discussed all risks, benefits, alternatives, and potential complications. Discussed the procedures and recovery. Patient desires to have surgery. A-scan ordered and performed today for intra-ocular lens calculations. The surgery will be performed in order to improve vision for driving, reading, and for eye examinations. Recommend phacoemulsification with intra-ocular lens. Recommend Dextenza for post-operative pain and inflammation. Right Eye. Dilates poorly - shugacaine by protocol. [facility]. Malyugin Ring. Omidira. 2.  Asymptomatic, recommend observation for now. Findings, prognosis and treatment options reviewed. 3.  Observe; Artificial tears as needed for irritation. 4.  will address after phaco PCIOL OD. 5.  Asteroid hyalosis is a benign degenerative eye condition marked by a buildup of calcium and lipids (fats) in the fluid between your retina and lens, called the vitreous humor. It may cause some floaters. 6.  Stressed importance of blood sugar and blood pressure control, and also yearly eye examinations. Discussed the need for ongoing proactive ocular exams and treatment, hopefully before visual symptoms develop.

## 2020-02-27 ENCOUNTER — Other Ambulatory Visit: Payer: Self-pay

## 2020-02-27 ENCOUNTER — Encounter (HOSPITAL_COMMUNITY)
Admission: RE | Admit: 2020-02-27 | Discharge: 2020-02-27 | Disposition: A | Discharge status: Home / Self Care | Payer: Medicaid Other | Source: Ambulatory Visit | Attending: Ophthalmology | Admitting: Ophthalmology

## 2020-02-27 ENCOUNTER — Other Ambulatory Visit (HOSPITAL_COMMUNITY)
Admission: RE | Admit: 2020-02-27 | Discharge: 2020-02-27 | Disposition: A | Discharge status: Home / Self Care | Payer: Medicaid Other | Source: Ambulatory Visit | Attending: Ophthalmology | Admitting: Ophthalmology

## 2020-02-27 NOTE — Progress Notes (Signed)
I called patient on his home phone, it sounded like he picked up but I couldn't hear him and he couldn't hear me. I also called his cell phone and left a message with his voicemail. Telling him if he couldn't come in today before 4:00 to please call his Dr.'s office and reschedule his appointment.

## 2020-03-16 ENCOUNTER — Encounter (HOSPITAL_COMMUNITY)
Admission: RE | Admit: 2020-03-16 | Discharge: 2020-03-16 | Disposition: A | Discharge status: Home / Self Care | Payer: Medicaid Other | Source: Ambulatory Visit | Attending: Ophthalmology | Admitting: Ophthalmology

## 2020-03-16 ENCOUNTER — Other Ambulatory Visit: Payer: Self-pay

## 2020-03-16 NOTE — Progress Notes (Signed)
Called patient and left a message with his cell phone voicemail. He has Covid appointments scheduled for 2/10 & 04/02/20. He's also still on AP-OR schedule for 2/14/ & 28/22. AP-OR rooms have not been cancelled either.  I was told per Cleo there would be no eye surgeries till March. I found no notes from PAT or OR to support this information.

## 2020-03-17 ENCOUNTER — Other Ambulatory Visit (HOSPITAL_COMMUNITY): Payer: Medicaid Other

## 2020-03-17 NOTE — H&P (Signed)
Surgical History & Physical  Patient Name: Brendan Campbell DOB: 11-22-1970  Surgery: Cataract extraction with intraocular lens implant phacoemulsification; Right Eye  Surgeon: Baruch Goldmann MD Surgery Date:  03/23/2020 Pre-Op Date:  03/17/2020  HPI: A 89 Yr. old male patient 1. 1. The patient presents for cataract evaluation, referred by Dr. Wynetta Emery. The patient complains of difficulty when recognizing people, which began 1 years ago. Both eyes are affected, OD worse than OS. The patient describes foggy and hazy symptoms affecting their eyes/vision, patient describes it as being "milky". The complaint is associated with blurry vision, glare, halos, itching and pain pt states OD is dry and itching, and at times he gets an aching pain in OD. This is This is negatively affecting the patient's quality of life. The patient experiences no shadow, curtain or veil. The patient reports seeing some white spots, and occasional flashes of light OD. Pt had a laser procedure done in [place] years ago when seeing spots on eye. Pt states it was OS, but could have been OD (notes specify OD). HPI Completed by Dr. Baruch Goldmann  Medical History: Cataracts Diabetes - DM Type 2 High Blood Pressure Hypercholesterolemia Back Pain Neuropathy  Review of Systems Genitourinary Dialysis Musculoskeletal Joint Ache All recorded systems are negative except as noted above.  Social   Former smoker   Medication Vitamin D, Cholesterol medication, Blood thinner, Lantus,   Sx/Procedures Retina Laser,   Drug Allergies   NKDA  History & Physical: Heent:  Cataract, Right eye NECK: supple without bruits LUNGS: lungs clear to auscultation CV: regular rate and rhythm Abdomen: soft and non-tender  Impression & Plan: Assessment: 1.  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  DERMATOCHALASIS; Right Upper Lid, Left Upper Lid (H02.831, H02.834) 3.  Pinguecula; Both Eyes (H11.153) 4.   COMBINED FORMS AGE RELATED CATARACT; Left Eye (H25.812) 5.  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  Diabetes Type 2 No retinopathy (E11.9)  Plan: 1.  Cataract accounts for the patient's decreased vision. This visual impairment is not correctable with a tolerable change in glasses or contact lenses. Cataract surgery with an implantation of a new lens should significantly improve the visual and functional status of the patient. Discussed all risks, benefits, alternatives, and potential complications. Discussed the procedures and recovery. Patient desires to have surgery. A-scan ordered and performed today for intra-ocular lens calculations. The surgery will be performed in order to improve vision for driving, reading, and for eye examinations. Recommend phacoemulsification with intra-ocular lens. Recommend Dextenza for post-operative pain and inflammation. Right Eye. Dilates poorly - shugacaine by protocol. [facility] [place]. Malyugin Ring. Omidira. 2.  Asymptomatic, recommend observation for now. Findings, prognosis and treatment options reviewed. 3.  Observe; Artificial tears as needed for irritation. 4.  will address after phaco PCIOL OD. 5.  Asteroid hyalosis is a benign degenerative eye condition marked by a buildup of calcium and lipids (fats) in the fluid between your retina and lens, called the vitreous humor. It may cause some floaters. 6.  Stressed importance of blood sugar and blood pressure control, and also yearly eye examinations. Discussed the need for ongoing proactive ocular exams and treatment, hopefully before visual symptoms develop.

## 2020-03-19 ENCOUNTER — Other Ambulatory Visit (HOSPITAL_COMMUNITY)
Admission: RE | Admit: 2020-03-19 | Discharge: 2020-03-19 | Disposition: A | Discharge status: Home / Self Care | Payer: Medicaid Other | Source: Ambulatory Visit | Attending: Ophthalmology | Admitting: Ophthalmology

## 2020-03-19 NOTE — Progress Notes (Signed)
I called patient 2:30ish, he forgot all about his covid test. He said he'll have to do just 2 hours of dialysis and come tomorrow at 2:00.  I need to cancel today's appointment so I can schedule tomorrow's appointment and put in his covid order.

## 2020-03-20 ENCOUNTER — Other Ambulatory Visit (HOSPITAL_COMMUNITY)
Admission: RE | Admit: 2020-03-20 | Discharge: 2020-03-20 | Disposition: A | Discharge status: Home / Self Care | Payer: Medicaid Other | Source: Ambulatory Visit | Attending: Ophthalmology | Admitting: Ophthalmology

## 2020-03-20 ENCOUNTER — Other Ambulatory Visit (HOSPITAL_COMMUNITY): Payer: Medicaid Other

## 2020-03-22 ENCOUNTER — Encounter (HOSPITAL_COMMUNITY): Payer: Self-pay | Admitting: Anesthesiology

## 2020-03-23 ENCOUNTER — Encounter (HOSPITAL_COMMUNITY): Admission: RE | Payer: Self-pay | Source: Home / Self Care

## 2020-03-23 ENCOUNTER — Ambulatory Visit (HOSPITAL_COMMUNITY): Admission: RE | Admit: 2020-03-23 | Payer: Medicaid Other | Source: Home / Self Care | Admitting: Ophthalmology

## 2020-03-23 SURGERY — PHACOEMULSIFICATION, CATARACT, WITH IOL INSERTION
Anesthesia: Monitor Anesthesia Care | Laterality: Right

## 2020-03-23 NOTE — OR Nursing (Signed)
Patient did not show up today for his procedure. He also was a no show for his PAT and Covid test. Procedure was cancelled

## 2020-04-01 ENCOUNTER — Encounter (HOSPITAL_COMMUNITY): Payer: Medicaid Other

## 2020-04-02 ENCOUNTER — Other Ambulatory Visit (HOSPITAL_COMMUNITY): Payer: Medicaid Other

## 2020-04-06 ENCOUNTER — Ambulatory Visit: Admit: 2020-04-06 | Payer: Medicaid Other | Admitting: Ophthalmology

## 2020-04-06 SURGERY — PHACOEMULSIFICATION, CATARACT, WITH IOL INSERTION
Anesthesia: Monitor Anesthesia Care | Laterality: Left

## 2020-04-08 ENCOUNTER — Inpatient Hospital Stay (HOSPITAL_COMMUNITY)
Admission: EM | Admit: 2020-04-08 | Discharge: 2020-04-09 | DRG: 640 | Disposition: A | Discharge status: Home / Self Care | Payer: Medicaid Other | Attending: Family Medicine | Admitting: Family Medicine

## 2020-04-08 ENCOUNTER — Other Ambulatory Visit: Payer: Self-pay

## 2020-04-08 ENCOUNTER — Emergency Department (HOSPITAL_COMMUNITY): Payer: Medicaid Other

## 2020-04-08 ENCOUNTER — Encounter (HOSPITAL_COMMUNITY): Payer: Self-pay | Admitting: Emergency Medicine

## 2020-04-08 ENCOUNTER — Inpatient Hospital Stay (HOSPITAL_COMMUNITY): Payer: Medicaid Other

## 2020-04-08 DIAGNOSIS — I5042 Chronic combined systolic (congestive) and diastolic (congestive) heart failure: Secondary | ICD-10-CM | POA: Diagnosis present

## 2020-04-08 DIAGNOSIS — E1122 Type 2 diabetes mellitus with diabetic chronic kidney disease: Secondary | ICD-10-CM | POA: Diagnosis present

## 2020-04-08 DIAGNOSIS — E875 Hyperkalemia: Secondary | ICD-10-CM | POA: Diagnosis present

## 2020-04-08 DIAGNOSIS — R778 Other specified abnormalities of plasma proteins: Secondary | ICD-10-CM

## 2020-04-08 DIAGNOSIS — I5021 Acute systolic (congestive) heart failure: Secondary | ICD-10-CM | POA: Diagnosis not present

## 2020-04-08 DIAGNOSIS — I4891 Unspecified atrial fibrillation: Secondary | ICD-10-CM

## 2020-04-08 DIAGNOSIS — G8194 Hemiplegia, unspecified affecting left nondominant side: Secondary | ICD-10-CM | POA: Insufficient documentation

## 2020-04-08 DIAGNOSIS — Z89511 Acquired absence of right leg below knee: Secondary | ICD-10-CM | POA: Diagnosis not present

## 2020-04-08 DIAGNOSIS — I248 Other forms of acute ischemic heart disease: Secondary | ICD-10-CM | POA: Diagnosis present

## 2020-04-08 DIAGNOSIS — R9431 Abnormal electrocardiogram [ECG] [EKG]: Secondary | ICD-10-CM | POA: Diagnosis not present

## 2020-04-08 DIAGNOSIS — D72829 Elevated white blood cell count, unspecified: Secondary | ICD-10-CM

## 2020-04-08 DIAGNOSIS — N186 End stage renal disease: Secondary | ICD-10-CM | POA: Diagnosis present

## 2020-04-08 DIAGNOSIS — Z833 Family history of diabetes mellitus: Secondary | ICD-10-CM | POA: Diagnosis not present

## 2020-04-08 DIAGNOSIS — F17200 Nicotine dependence, unspecified, uncomplicated: Secondary | ICD-10-CM | POA: Diagnosis present

## 2020-04-08 DIAGNOSIS — E785 Hyperlipidemia, unspecified: Secondary | ICD-10-CM | POA: Diagnosis present

## 2020-04-08 DIAGNOSIS — Z7901 Long term (current) use of anticoagulants: Secondary | ICD-10-CM | POA: Diagnosis not present

## 2020-04-08 DIAGNOSIS — J9601 Acute respiratory failure with hypoxia: Secondary | ICD-10-CM

## 2020-04-08 DIAGNOSIS — M47816 Spondylosis without myelopathy or radiculopathy, lumbar region: Secondary | ICD-10-CM | POA: Insufficient documentation

## 2020-04-08 DIAGNOSIS — D539 Nutritional anemia, unspecified: Secondary | ICD-10-CM

## 2020-04-08 DIAGNOSIS — E1140 Type 2 diabetes mellitus with diabetic neuropathy, unspecified: Secondary | ICD-10-CM | POA: Diagnosis present

## 2020-04-08 DIAGNOSIS — Z8249 Family history of ischemic heart disease and other diseases of the circulatory system: Secondary | ICD-10-CM

## 2020-04-08 DIAGNOSIS — I132 Hypertensive heart and chronic kidney disease with heart failure and with stage 5 chronic kidney disease, or end stage renal disease: Secondary | ICD-10-CM | POA: Diagnosis present

## 2020-04-08 DIAGNOSIS — I5043 Acute on chronic combined systolic (congestive) and diastolic (congestive) heart failure: Secondary | ICD-10-CM | POA: Diagnosis not present

## 2020-04-08 DIAGNOSIS — Z20822 Contact with and (suspected) exposure to covid-19: Secondary | ICD-10-CM | POA: Diagnosis present

## 2020-04-08 DIAGNOSIS — I5023 Acute on chronic systolic (congestive) heart failure: Secondary | ICD-10-CM | POA: Diagnosis not present

## 2020-04-08 DIAGNOSIS — R7989 Other specified abnormal findings of blood chemistry: Secondary | ICD-10-CM

## 2020-04-08 DIAGNOSIS — Z992 Dependence on renal dialysis: Secondary | ICD-10-CM

## 2020-04-08 DIAGNOSIS — Z794 Long term (current) use of insulin: Secondary | ICD-10-CM

## 2020-04-08 DIAGNOSIS — Z7982 Long term (current) use of aspirin: Secondary | ICD-10-CM | POA: Diagnosis not present

## 2020-04-08 DIAGNOSIS — Z9115 Patient's noncompliance with renal dialysis: Secondary | ICD-10-CM

## 2020-04-08 DIAGNOSIS — E8770 Fluid overload, unspecified: Principal | ICD-10-CM | POA: Diagnosis present

## 2020-04-08 DIAGNOSIS — Z801 Family history of malignant neoplasm of trachea, bronchus and lung: Secondary | ICD-10-CM | POA: Diagnosis not present

## 2020-04-08 DIAGNOSIS — Z79899 Other long term (current) drug therapy: Secondary | ICD-10-CM

## 2020-04-08 LAB — ECHOCARDIOGRAM COMPLETE
AR max vel: 1.82 cm2
AV Area VTI: 2.04 cm2
AV Area mean vel: 1.78 cm2
AV Mean grad: 3.7 mmHg
AV Peak grad: 8.1 mmHg
Ao pk vel: 1.43 m/s
Area-P 1/2: 4.83 cm2
Height: 72 in
S' Lateral: 3.11 cm
Weight: 3520.31 oz

## 2020-04-08 LAB — TROPONIN I (HIGH SENSITIVITY)
Troponin I (High Sensitivity): 52 ng/L — ABNORMAL HIGH (ref <18)
Troponin I (High Sensitivity): 71 ng/L — ABNORMAL HIGH (ref <18)
Troponin I (High Sensitivity): 100 ng/L (ref <18)
Troponin I (High Sensitivity): 102 ng/L (ref <18)

## 2020-04-08 LAB — BASIC METABOLIC PANEL
Anion gap: 18 — ABNORMAL HIGH (ref 5–15)
Anion gap: 15 (ref 5–15)
BUN: 77 mg/dL — ABNORMAL HIGH (ref 6–20)
BUN: 45 mg/dL — ABNORMAL HIGH (ref 6–20)
CO2: 19 mmol/L — ABNORMAL LOW (ref 22–32)
CO2: 24 mmol/L (ref 22–32)
Calcium: 7.1 mg/dL — ABNORMAL LOW (ref 8.9–10.3)
Calcium: 7.6 mg/dL — ABNORMAL LOW (ref 8.9–10.3)
Chloride: 99 mmol/L (ref 98–111)
Chloride: 96 mmol/L — ABNORMAL LOW (ref 98–111)
Creatinine, Ser: 14.06 mg/dL — ABNORMAL HIGH (ref 0.61–1.24)
Creatinine, Ser: 6.88 mg/dL — ABNORMAL HIGH (ref 0.61–1.24)
GFR, Estimated: 4 mL/min — ABNORMAL LOW (ref >60)
GFR, Estimated: 9 mL/min — ABNORMAL LOW (ref >60)
Glucose, Bld: 81 mg/dL (ref 70–99)
Glucose, Bld: 92 mg/dL (ref 70–99)
Potassium: 6 mmol/L — ABNORMAL HIGH (ref 3.5–5.1)
Potassium: 3.9 mmol/L (ref 3.5–5.1)
Sodium: 136 mmol/L (ref 135–145)
Sodium: 135 mmol/L (ref 135–145)

## 2020-04-08 LAB — RESP PANEL BY RT-PCR (FLU A&B, COVID) ARPGX2
Influenza A by PCR: NEGATIVE
Influenza B by PCR: NEGATIVE
SARS Coronavirus 2 by RT PCR: NEGATIVE

## 2020-04-08 LAB — CBC WITH DIFFERENTIAL/PLATELET
Abs Immature Granulocytes: 0.07 10*3/uL (ref 0.00–0.07)
Basophils Absolute: 0.1 10*3/uL (ref 0.0–0.1)
Basophils Relative: 1 %
Eosinophils Absolute: 0.5 10*3/uL (ref 0.0–0.5)
Eosinophils Relative: 3 %
HCT: 34.1 % — ABNORMAL LOW (ref 39.0–52.0)
Hemoglobin: 10.9 g/dL — ABNORMAL LOW (ref 13.0–17.0)
Immature Granulocytes: 1 %
Lymphocytes Relative: 10 %
Lymphs Abs: 1.4 10*3/uL (ref 0.7–4.0)
MCH: 33.5 pg (ref 26.0–34.0)
MCHC: 32 g/dL (ref 30.0–36.0)
MCV: 104.9 fL — ABNORMAL HIGH (ref 80.0–100.0)
Monocytes Absolute: 0.7 10*3/uL (ref 0.1–1.0)
Monocytes Relative: 5 %
Neutro Abs: 10.8 10*3/uL — ABNORMAL HIGH (ref 1.7–7.7)
Neutrophils Relative %: 80 %
Platelets: 217 10*3/uL (ref 150–400)
RBC: 3.25 MIL/uL — ABNORMAL LOW (ref 4.22–5.81)
RDW: 13 % (ref 11.5–15.5)
WBC: 13.5 10*3/uL — ABNORMAL HIGH (ref 4.0–10.5)
nRBC: 0 % (ref 0.0–0.2)

## 2020-04-08 LAB — GLUCOSE, CAPILLARY
Glucose-Capillary: 50 mg/dL — ABNORMAL LOW (ref 70–99)
Glucose-Capillary: 93 mg/dL (ref 70–99)
Glucose-Capillary: 100 mg/dL — ABNORMAL HIGH (ref 70–99)
Glucose-Capillary: 100 mg/dL — ABNORMAL HIGH (ref 70–99)
Glucose-Capillary: 121 mg/dL — ABNORMAL HIGH (ref 70–99)
Glucose-Capillary: 93 mg/dL (ref 70–99)

## 2020-04-08 LAB — HEMOGLOBIN A1C
Hgb A1c MFr Bld: 5.3 % (ref 4.8–5.6)
Mean Plasma Glucose: 105.41 mg/dL

## 2020-04-08 LAB — HEPATITIS B SURFACE ANTIGEN: Hepatitis B Surface Ag: NONREACTIVE

## 2020-04-08 LAB — BRAIN NATRIURETIC PEPTIDE: B Natriuretic Peptide: 1971 pg/mL — ABNORMAL HIGH (ref 0.0–100.0)

## 2020-04-08 IMAGING — DX DG CHEST 1V PORT
1 series · 2 of 2 positions shown · non-contrast
Comparison: [DATE]

CLINICAL DATA: Chest pain

EXAM:
PORTABLE CHEST 1 VIEW

[Series 1: chest ap · 0.14mm/px · 2 of 2 slices shown]
[im 1/2]
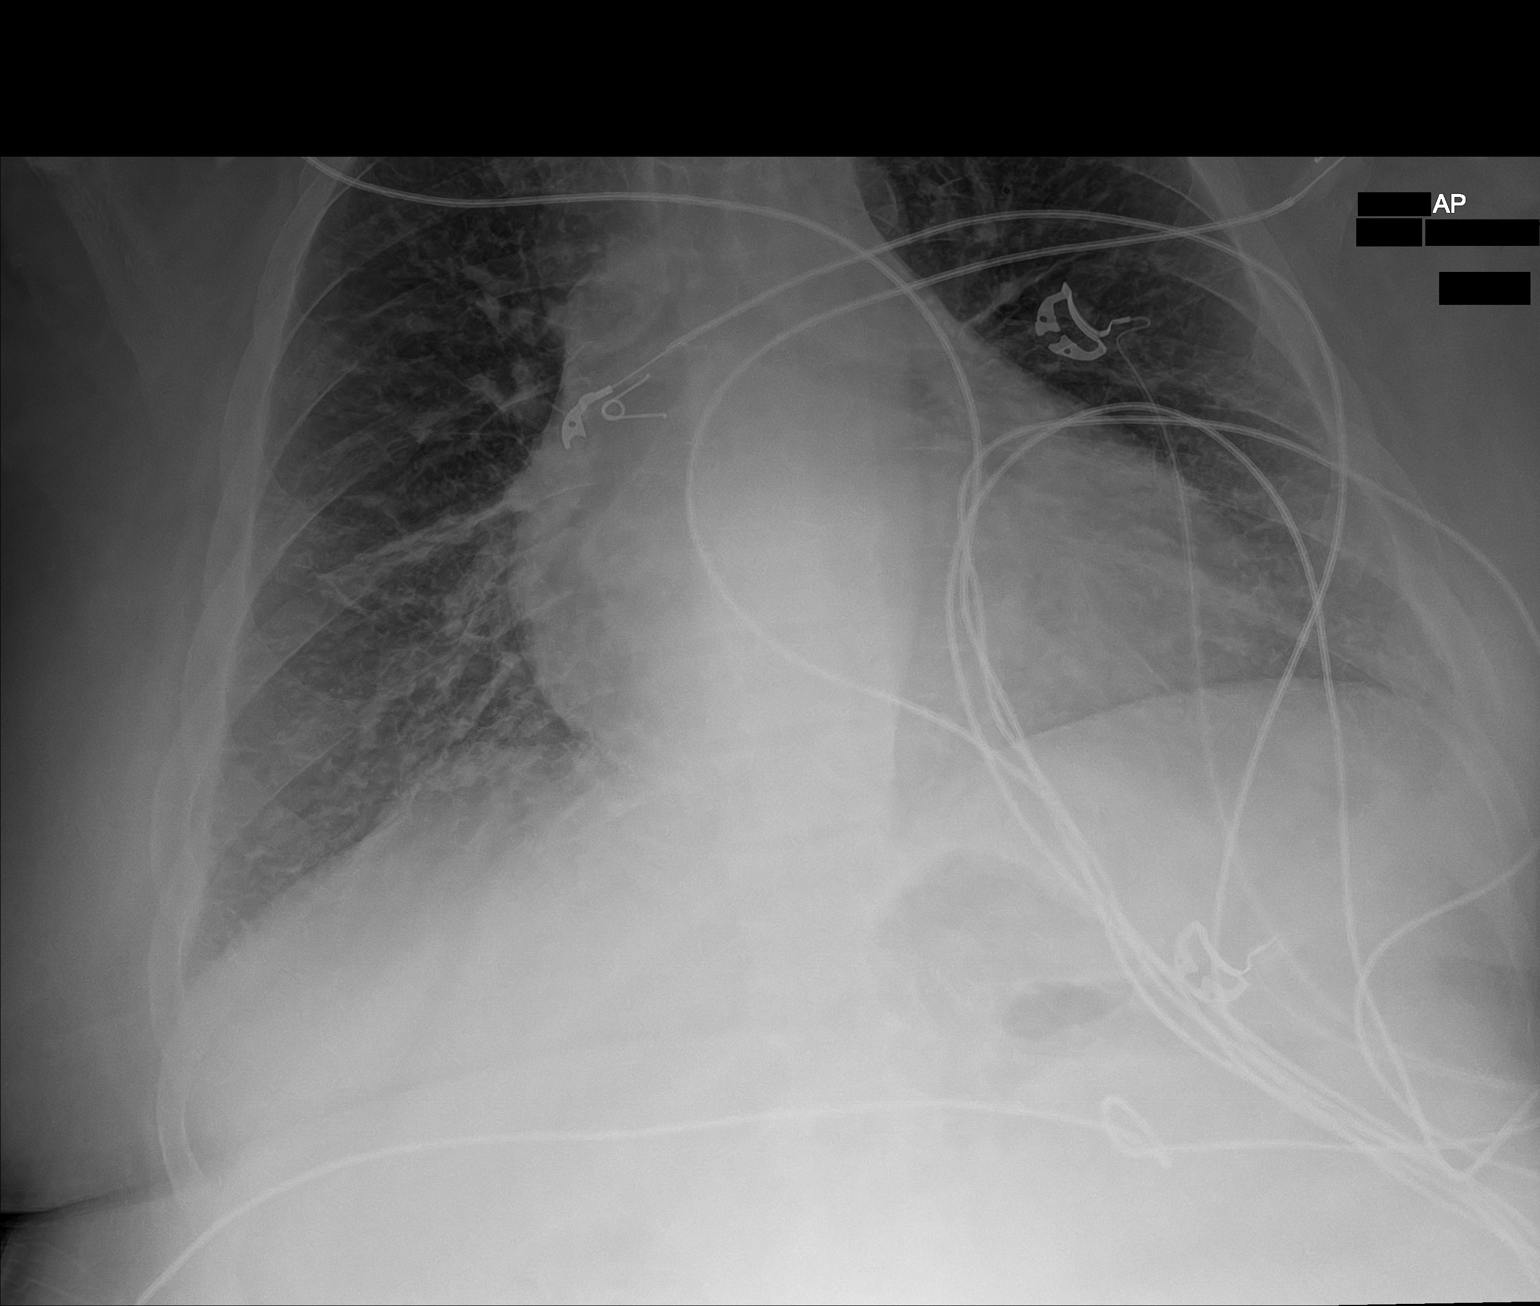
[im 2/2]
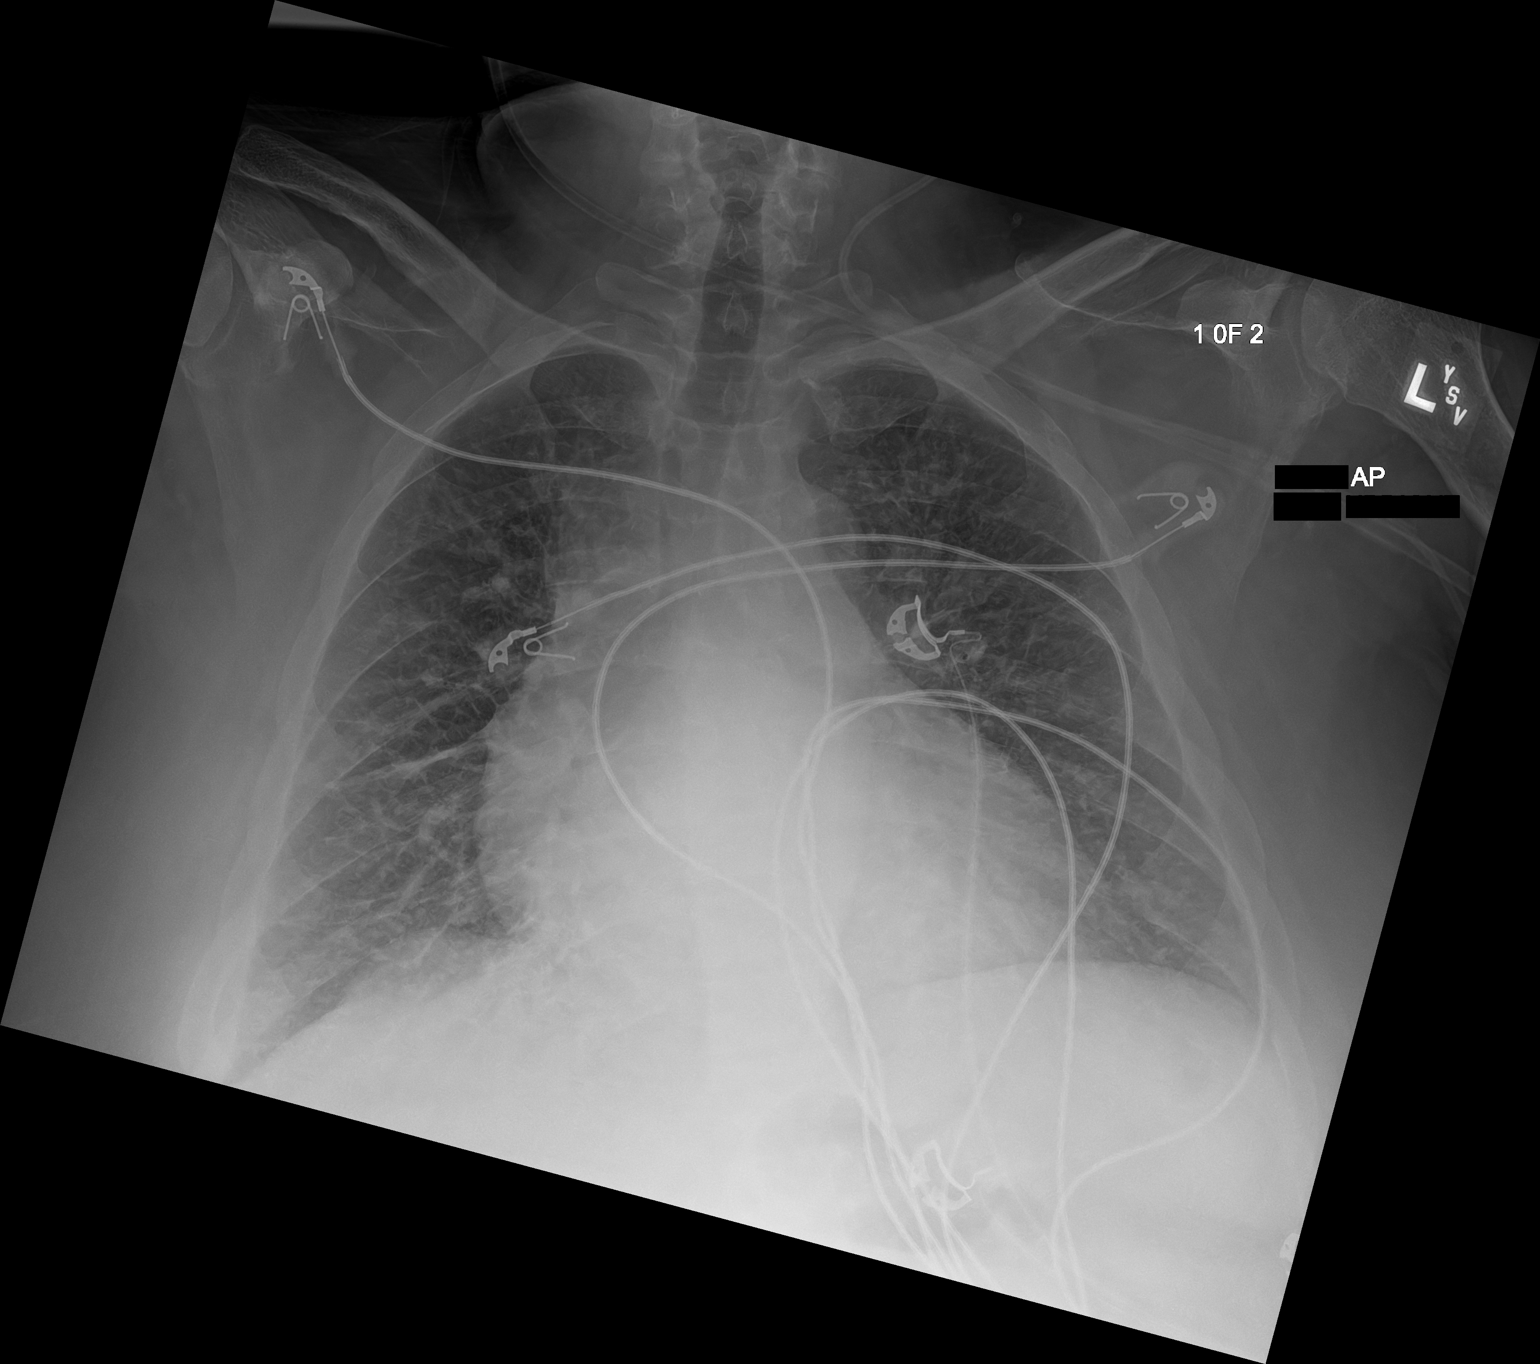

[2 of 2 positions shown; findings below may reference images not displayed]

FINDINGS: The heart size and mediastinal contours are mildly enlarged. There
is prominence of the central pulmonary vasculature. No large
airspace consolidation or pleural effusion. The visualized skeletal
structures are unremarkable.
IMPRESSION: Mild cardiomegaly and pulmonary vascular congestion.

## 2020-04-08 MED ORDER — SODIUM ZIRCONIUM CYCLOSILICATE 5 G PO PACK
10.0000 g | PACK | Freq: Once | ORAL | Status: AC
  Administered 2020-04-08: 10 g via ORAL
  Filled 2020-04-08: qty 2

## 2020-04-08 MED ORDER — INSULIN ASPART 100 UNIT/ML ~~LOC~~ SOLN: 0.0000 [IU] | Freq: Three times a day (TID) | SUBCUTANEOUS | Status: DC

## 2020-04-08 MED ORDER — SODIUM BICARBONATE 8.4 % IV SOLN
50.0000 meq | Freq: Once | INTRAVENOUS | Status: AC
  Administered 2020-04-08: 50 meq via INTRAVENOUS

## 2020-04-08 MED ORDER — APIXABAN 5 MG PO TABS
5.0000 mg | ORAL_TABLET | Freq: Two times a day (BID) | ORAL | Status: DC
  Administered 2020-04-08 – 2020-04-09 (×2): 5 mg via ORAL
  Filled 2020-04-08 (×2): qty 1

## 2020-04-08 MED ORDER — HEPARIN SODIUM (PORCINE) 5000 UNIT/ML IJ SOLN
5000.0000 [IU] | Freq: Three times a day (TID) | INTRAMUSCULAR | Status: DC
  Filled 2020-04-08: qty 1

## 2020-04-08 MED ORDER — CALCIUM GLUCONATE 10 % IV SOLN
1.0000 g | Freq: Once | INTRAVENOUS | Status: AC
  Administered 2020-04-08: 1 g via INTRAVENOUS
  Filled 2020-04-08: qty 10

## 2020-04-08 MED ORDER — MELATONIN 3 MG PO TABS
6.0000 mg | ORAL_TABLET | Freq: Every day | ORAL | Status: DC
  Administered 2020-04-08: 6 mg via ORAL
  Filled 2020-04-08: qty 2

## 2020-04-08 MED ORDER — FUROSEMIDE 10 MG/ML IJ SOLN
40.0000 mg | Freq: Once | INTRAMUSCULAR | Status: AC
  Administered 2020-04-08: 40 mg via INTRAVENOUS
  Filled 2020-04-08: qty 4

## 2020-04-08 MED ORDER — ASPIRIN 81 MG PO CHEW
324.0000 mg | CHEWABLE_TABLET | Freq: Once | ORAL | Status: AC
  Administered 2020-04-08: 324 mg via ORAL
  Filled 2020-04-08: qty 4

## 2020-04-08 MED ORDER — INSULIN ASPART 100 UNIT/ML ~~LOC~~ SOLN
5.0000 [IU] | Freq: Once | SUBCUTANEOUS | Status: AC
  Administered 2020-04-08: 5 [IU] via SUBCUTANEOUS
  Filled 2020-04-08: qty 1

## 2020-04-08 MED ORDER — INSULIN ASPART 100 UNIT/ML ~~LOC~~ SOLN: 0.0000 [IU] | Freq: Every day | SUBCUTANEOUS | Status: DC

## 2020-04-08 MED ORDER — ATORVASTATIN CALCIUM 10 MG PO TABS
10.0000 mg | ORAL_TABLET | Freq: Every evening | ORAL | Status: DC
  Administered 2020-04-08: 10 mg via ORAL

## 2020-04-08 MED ORDER — DEXTROSE 50 % IV SOLN
1.0000 | Freq: Once | INTRAVENOUS | Status: AC
  Administered 2020-04-08: 50 mL via INTRAVENOUS
  Filled 2020-04-08: qty 50

## 2020-04-08 NOTE — H&P (Signed)
History and Physical  Brendan Campbell EXH:[id number] DOB: 06-14-1970 DOA: 04/08/2020  Referring physician: Ezequiel Essex, MD PCP: Monico Blitz, MD  Patient coming from: Home  Chief Complaint: Shortness of breath  HPI: Brendan Campbell is a 51 y.o. male with medical history significant for ESRD on HD (MWF), HFrEF (LVEF of 25%-08/03/2019), T2DM, hyperlipidemia, A. 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  Patient states that he had a fall on Friday (2/25) and hurt his right knee, so he states that he missed his dialysis on Monday (2/28) due to still hurting with pain in his right knee and did not want to put weight on the leg.  However, yesterday (3/1), he started to notice worsening shortness of breath and inability to lay flat, so, he activated EMS and patient complained of central chest pain which was sharp and stabbing in route to the hospital.  Patient states that he has been compliant with his medication and he still makes urine.  He denies fever, chills, cough, vomiting or abdominal pain.  ED Course:  In the emergency department, he was tachypneic and tachycardic.  BP was 159/95.  Work-up in the ED showed leukocytosis, macrocytic anemia, hyperkalemia(K+ 6.0), BUN to creatinine 77/14.06, EGFR 4, anion gap 18, BNP 1971 (chronically elevated, 1,867 on 12/13/2019), troponin 52 > 71. Chest x-ray showed mild cardiomegaly and pulmonary vascular congestion. IV Lasix 40 mg x 1 was given, calcium gluconate to stabilize the heart was given due to hyperkalemia, IV dextrose and subcu insulin, sodium bicarbonate 1 amp, and Lokelma were given.  Hospitalist was asked to admit patient for further evaluation and management.  Review of Systems: Constitutional: Negative forchillsand fever.  HENT: Negative forear painand sore throat.  Eyes: Negative forpainand visual disturbance.   Respiratory: Positive for shortness of breath.  Negative forcough andchest tightness Cardiovascular: Negative forchest painand palpitations.  Gastrointestinal: Negative forabdominal painand vomiting.  Endocrine: Negative forpolyphagiaand polyuria.  Genitourinary: Negative fordecreased urine volume Musculoskeletal: Positive for right BKA with prosthesis.  Negative forarthralgiasand back pain.  Skin: Negative forcolor changeand rash.  Allergic/Immunologic: Negative forimmunocompromised state.  Neurological: Negative fortremors,syncope,speech difficulty Hematological:Does not bruise/bleed easily. All other systems reviewed and are negative   Past Medical History:  Diagnosis Date  . Anemia   . Anxiety   . Blood transfusion without reported diagnosis   . CHF (congestive heart failure) (HCC)    a. EF 25% by echo in 07/2019  . Chronic kidney disease   . Depression   . Diabetes mellitus without complication ([place])   . End stage renal disease ([place])    M/W/F [facility]  . Hyperlipidemia   . Neuropathy   . Peripheral vascular disease ([place])   . PTSD (post-traumatic stress disorder)    Past Surgical History:  Procedure Laterality Date  . A/V FISTULAGRAM N/A 10/09/2018   Procedure: A/V FISTULAGRAM;  Surgeon: Serafina Mitchell, MD;  Location: [place] [facility];  Service: Cardiovascular;  Laterality: N/A;  . AV FISTULA PLACEMENT Left 09/22/2016   Procedure: CREATION OF LEFT ARM ARTERIOVENOUS (AV) FISTULA;  Surgeon: Waynetta Sandy, MD;  Location: [place];  Service: Vascular;  Laterality: Left;  . AV FISTULA PLACEMENT Left 10/31/2017   Procedure: INSERTION OF ARTERIOVENOUS (AV) GORE-TEX 4-80m STETCH GRAFT LEFT ARM;  Surgeon: CWaynetta Sandy MD;  [place]  Service: Vascular;  Laterality: Left;  . BASCILIC VEIN TRANSPOSITION Left 08/17/2017  Procedure: SECOND STAGE BASILIC VEIN TRANSPOSITION LEFT ARM;  Surgeon: Waynetta Sandy, MD;  Location:  [place];  Service: Vascular;  Laterality: Left;  . BELOW KNEE LEG AMPUTATION Right   . CARDIOVERSION N/A 02/19/2019   Procedure: CARDIOVERSION;  Surgeon: [name], MD;  Location: [facility] ENDOSCOPY;  Service: Cardiovascular;  Laterality: N/A;  . CHOLECYSTECTOMY    . FOOT SURGERY    . IR FLUORO GUIDE CV LINE RIGHT  05/16/2016  . IR FLUORO GUIDE CV LINE RIGHT  02/16/2019  . IR REMOVAL TUN CV CATH W/O FL  05/16/2016  . IR REMOVAL TUN CV CATH W/O FL  02/19/2019  . IR THROMBECTOMY AV FISTULA W/THROMBOLYSIS/PTA INC/SHUNT/IMG LEFT Left 11/09/2018  . IR THROMBECTOMY AV FISTULA W/THROMBOLYSIS/PTA INC/SHUNT/IMG LEFT Left 06/22/2019  . IR US GUIDE VASC ACCESS LEFT  11/09/2018  . IR US GUIDE VASC ACCESS LEFT  06/22/2019  . IR US GUIDE VASC ACCESS RIGHT  05/16/2016  . IR US GUIDE VASC ACCESS RIGHT  02/16/2019  . PERIPHERAL VASCULAR BALLOON ANGIOPLASTY  10/09/2018   Procedure: PERIPHERAL VASCULAR BALLOON ANGIOPLASTY;  Surgeon: Serafina Mitchell, MD;  Location: [place] [facility];  Service: Cardiovascular;;  . TEE WITHOUT CARDIOVERSION N/A 02/19/2019   Procedure: TRANSESOPHAGEAL ECHOCARDIOGRAM (TEE);  Surgeon: [name], MD;  Location: [facility] ENDOSCOPY;  Service: Cardiovascular;  Laterality: N/A;    Social History:  reports that he has been smoking. He has never used smokeless tobacco. He reports that he does not drink alcohol and does not use drugs.   Allergies  Allergen Reactions  . Tape Other (See Comments)    Pulls skin off  . Other     Family History  Problem Relation Age of Onset  . Cancer Mother        lung  . Diabetes Mother   . Heart attack Father   . Diabetes Father   . Diabetes Sister     Prior to Admission medications   Medication Sig Start Date End Date Taking? Authorizing Provider  apixaban (ELIQUIS) 5 MG TABS tablet Take 1 tablet (5 mg total) by mouth 2 (two) times daily. 08/23/19   Strader, Fransisco Hertz, PA-C  aspirin 81 MG chewable tablet Chew 81 mg by mouth daily.    [provider]  atorvastatin (LIPITOR) 10 MG tablet Take 1 tablet (10 mg total) by mouth every evening. 08/03/19   Barton Dubois, MD  calcium acetate (PHOSLO) 667 MG capsule Take 1 capsule (667 mg total) by mouth 3 (three) times daily with meals. 02/21/19   Donnamae Jude, MD  calcium carbonate (TUMS - DOSED IN MG ELEMENTAL CALCIUM) 500 MG chewable tablet Chew 4 tablets (800 mg of elemental calcium total) by mouth 2 (two) times daily. 08/03/19   Barton Dubois, MD  carvedilol (COREG) 3.125 MG tablet Take 1 tablet (3.125 mg total) by mouth 2 (two) times daily with a meal. 08/23/19   Strader, [place] M, PA-C  LANTUS SOLOSTAR 100 UNIT/ML Solostar Pen Inject 10 Units into the skin at bedtime.  01/18/19   [provider]  lidocaine-prilocaine (EMLA) cream Apply 1 application topically See admin instructions. Applied three times weekly at dialysis on MWF 05/06/19   [provider]  multivitamin (RENA-VIT) TABS tablet Take 1 tablet by mouth daily. 08/04/18   [provider]  patiromer (VELTASSA) 8.4 g packet Take 1 packet (8.4 g total) by mouth 3 (three) times a week. On Tuesday, Thursday and Saturday 06/10/19   Kathie Dike, MD  Physical Exam: BP (!) 107/54   Pulse 78   Temp (!) 97.4 F (36.3 C) (Oral)   Resp 18   Ht 6' (1.829 m)   Wt 95.3 kg   SpO2 100%   BMI 28.48 kg/m   . General: 50 y.o. year-old male well developed well nourished in no acute distress.  Alert and oriented x3. . Cardiovascular: Tachycardia, irregular rate and rhythm with no rubs or gallops.  No thyromegaly or JVD noted. 2/4 pulses in all 4 extremities. [name] Respiratory: Rales in lower lobes. No wheezes . Abdomen: Soft, nontender, nondistended with normal bowel sounds x4 quadrants. . Muskuloskeletal: No cyanosis, clubbing or edema noted bilaterally . Neuro: CN II-XII intact, strength 5/5 x 4, sensation, reflexes intact . Skin: No ulcerative lesions noted or rashes . Psychiatry: Judgement and insight appear  normal. Mood is appropriate for condition and setting          Labs on Admission:  Basic Metabolic Panel: Recent Labs  Lab 04/08/20 0145  NA 136  K 6.0*  CL 99  CO2 19*  GLUCOSE 81  BUN 77*  CREATININE 14.06*  CALCIUM 7.1*   Liver Function Tests: No results for input(s): AST, ALT, ALKPHOS, BILITOT, PROT, ALBUMIN in the last 168 hours. No results for input(s): LIPASE, AMYLASE in the last 168 hours. No results for input(s): AMMONIA in the last 168 hours. CBC: Recent Labs  Lab 04/08/20 0145  WBC 13.5*  NEUTROABS 10.8*  HGB 10.9*  HCT 34.1*  MCV 104.9*  PLT 217   Cardiac Enzymes: No results for input(s): CKTOTAL, CKMB, CKMBINDEX, TROPONINI in the last 168 hours.  BNP (last 3 results) Recent Labs    06/07/19 1125 12/13/19 1322 04/08/20 0145  BNP 3,441.0* 1,867.0* 1,971.0*    ProBNP (last 3 results) No results for input(s): PROBNP in the last 8760 hours.  CBG: Recent Labs  Lab 04/08/20 0626  GLUCAP 50*    Radiological Exams on Admission: DG Chest Portable 1 View  Result Date: 04/08/2020 CLINICAL DATA:  Chest pain EXAM: PORTABLE CHEST 1 VIEW COMPARISON:  December 13, 2019 FINDINGS: The heart size and mediastinal contours are mildly enlarged. There is prominence of the central pulmonary vasculature. No large airspace consolidation or pleural effusion. The visualized skeletal structures are unremarkable. IMPRESSION: Mild cardiomegaly and pulmonary vascular congestion. Electronically Signed   By: Prudencio Pair M.D.   On: 04/08/2020 02:00    EKG: I independently viewed the EKG done and my findings are as followed: A. fib with RVR and with prolonged QTc (548 ms)   Assessment/Plan Present on Admission: . Leukocytosis . Macrocytic anemia  Active Problems:   Prolonged QT interval   Leukocytosis   Macrocytic anemia   Acute on chronic combined systolic and diastolic CHF (congestive heart failure) (HCC)   Atrial fibrillation with RVR (HCC)   Elevated troponin    Acute respiratory failure with hypoxia (HCC)  Acute respiratory failure with hypoxia possibly secondary to acute on chronic combined systolic and systolic CHF ESRD on HD (MWF) Patient missed his dialysis on Monday (2/28), last dialysis was on Friday (2/25) Chest x-ray showed mild cardiomegaly and pulmonary vascular congestion. Patient complains of increased shortness of breath and orthopnea, this is possibly due to fluid overload due to missed dialysis Continue total input/output, daily weights and fluid restriction IV Lasix 40 mg x 1 was given in the ED Nephrology will be consulted for dialysis this morning  Further Lasix held at this time due to soft BP Continue Cardiac  diet  Echocardiogram showed  LVEF of 25%-08/03/2019 and LV grade 1 diastolic dysfunction.  Echocardiogram will be done in the morning   Hyperkalemia K+ 6.0; this is possibly due to ESRD Calcium gluconate was given IV dextrose and subcu insulin, sodium bicarbonate 1 amp, and Lokelma were given. Continue telemetry Continue to monitor K+  Elevated troponin possibly secondary to type II demand ischemia Troponin x2-52 > 71, patient denies chest pain at this time, it appears that he complained of initial chest pain.  Continue to trend troponin  Prolonged QTc (548 ms) Avoid QT prolonging drugs Magnesium level will be checked Repeat EKG in the morning  Elevated BNP (chronic) BNP 1971 (chronically elevated, 1,867 on 12/13/2019) This may be due to worsening kidney function Continue to manage as described above for CHF  A. fib with RVR CHA2DS2- VASc score   is = 3 Which is  equal to = 3.2% annual risk of stroke  Resume home Coreg when BP improves Continue home Eliquis  Macrocytic anemia (chronic) Vitamin B12 level on 8/18 was 444 Folate 10.7 on 09/25/2019  Leukocytosis possibly reactive WBC 13.5, no concern for acute infectious process identified Continue to monitor WBC with morning labs  T2DM Continue ISS and  hypoglycemic protocol  Hyperlipidemia Continue home statin   DVT prophylaxis: Eliquis  Code Status: Full code  Family Communication: None at bedside  Disposition Plan:  Patient is from:                        home Anticipated DC to:                   SNF or family members home Anticipated DC date:               2-3 days Anticipated DC barriers:           Patient requires inpatient management of acute aspiration of CHF with hypoxia and need for hemodialysis.   Consults called: Nephrology  Admission status: Inpatient    Bernadette Hoit MD [facility]  04/08/2020, 6:37 AM

## 2020-04-08 NOTE — ED Triage Notes (Signed)
Pt c/o chest pain and sob since missing dialysis on Monday.

## 2020-04-08 NOTE — ED Provider Notes (Signed)
[facility] EMERGENCY DEPARTMENT Provider Note   CSN: [id number] Arrival date & time: 04/08/20  0119     History Chief Complaint  Patient presents with  . Shortness of Breath    Brendan Campbell is a 50 y.o. male.  Level 5 caveat respiratory distress.  Patient with ESRD on dialysis Monday, Wednesday and Friday.  CHF with EF of 25%, diabetes here with shortness of breath, chest pain and missed dialysis.  States developed shortness of breath about 3 PM yesterday afternoon.  Progressively worsening since then with inability to lay flat.  On the way here he developed some central chest pain that is sharp and stabbing and constant.  Admits to missing dialysis on Monday.  No other missed sessions.  Does make some urine.  States compliance with his medications otherwise.  No abdominal pain, nausea or vomiting.  No diaphoresis.  No fever cough.  No leg pain or leg swelling  The history is provided by the patient and the EMS personnel. The history is limited by the condition of the patient.  Shortness of Breath Associated symptoms: chest pain   Associated symptoms: no abdominal pain, no cough, no fever, no headaches, no rash and no vomiting        Past Medical History:  Diagnosis Date  . Anemia   . Anxiety   . Blood transfusion without reported diagnosis   . CHF (congestive heart failure) (HCC)    a. EF 25% by echo in 07/2019  . Chronic kidney disease   . Depression   . Diabetes mellitus without complication ([place])   . End stage renal disease ([place])    M/W/F [facility]  . Hyperlipidemia   . Neuropathy   . Peripheral vascular disease ([place])   . PTSD (post-traumatic stress disorder)     Patient Active Problem List   Diagnosis Date Noted  . Acute congestive heart failure ([place]) 12/13/2019  . Macrocytic anemia 09/25/2019  . Paresthesia of skin 09/24/2019  . Hypocalcemia   . Atrial fibrillation, chronic ([place])   . Hyperparathyroidism ([place])   . Left sided numbness 08/02/2019  .  Cardiac arrest ([place]) 06/07/2019  . Atrial flutter ([place])   . CHF (congestive heart failure) ([place])   . Neuropathy   . Peripheral vascular disease ([place])   . Abnormal EKG   . Leukocytosis   . Atypical pneumonia   . ESRD needing dialysis ([place])   . Acute pulmonary edema (HCC)   . ESRD on hemodialysis ([place])   . Hyperkalemia 10/05/2018  . Weakness 10/05/2018  . Chest pain 05/10/2016  . 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 Lumbar radiculopathy 03/08/2016  . S/P below knee amputation, right ([place]) 03/05/2015  . Type 2 diabetes with nephropathy ([place]) 03/03/2015  . HLD (hyperlipidemia) 03/03/2015    Past Surgical History:  Procedure Laterality Date  . A/V FISTULAGRAM N/A 10/09/2018   Procedure: A/V FISTULAGRAM;  Surgeon: Serafina Mitchell, MD;  Location: [place] CV LAB;  Service: Cardiovascular;  Laterality: N/A;  . AV FISTULA PLACEMENT Left 09/22/2016   Procedure: CREATION OF LEFT ARM ARTERIOVENOUS (AV) FISTULA;  Surgeon: Waynetta Sandy, MD;  Location: [place];  Service: Vascular;  Laterality: Left;  . AV FISTULA PLACEMENT Left 10/31/2017   Procedure: INSERTION OF ARTERIOVENOUS (AV) GORE-TEX 4-63m STETCH GRAFT LEFT ARM;  Surgeon: CWaynetta Sandy MD;  [place]  Service: Vascular;  Laterality: Left;  . BASCILIC VEIN TRANSPOSITION  Left 08/17/2017   Procedure: SECOND STAGE BASILIC VEIN TRANSPOSITION LEFT ARM;  Surgeon: Waynetta Sandy, MD;  Location: [place];  Service: Vascular;  Laterality: Left;  . BELOW KNEE LEG AMPUTATION Right   . CARDIOVERSION N/A 02/19/2019   Procedure: CARDIOVERSION;  Surgeon: [name], MD;  Location: [facility] ENDOSCOPY;  Service: Cardiovascular;  Laterality: N/A;  . CHOLECYSTECTOMY    . FOOT SURGERY    . IR FLUORO GUIDE CV LINE RIGHT  05/16/2016  . IR FLUORO GUIDE CV LINE RIGHT  02/16/2019  . IR REMOVAL TUN CV CATH W/O FL  05/16/2016  . IR REMOVAL TUN CV CATH W/O FL  02/19/2019   . IR THROMBECTOMY AV FISTULA W/THROMBOLYSIS/PTA INC/SHUNT/IMG LEFT Left 11/09/2018  . IR THROMBECTOMY AV FISTULA W/THROMBOLYSIS/PTA INC/SHUNT/IMG LEFT Left 06/22/2019  . IR US GUIDE VASC ACCESS LEFT  11/09/2018  . IR US GUIDE VASC ACCESS LEFT  06/22/2019  . IR US GUIDE VASC ACCESS RIGHT  05/16/2016  . IR US GUIDE VASC ACCESS RIGHT  02/16/2019  . PERIPHERAL VASCULAR BALLOON ANGIOPLASTY  10/09/2018   Procedure: PERIPHERAL VASCULAR BALLOON ANGIOPLASTY;  Surgeon: Serafina Mitchell, MD;  Location: [place] CV LAB;  Service: Cardiovascular;;  . TEE WITHOUT CARDIOVERSION N/A 02/19/2019   Procedure: TRANSESOPHAGEAL ECHOCARDIOGRAM (TEE);  Surgeon: [name], MD;  Location: [facility] ENDOSCOPY;  Service: Cardiovascular;  Laterality: N/A;       Family History  Problem Relation Age of Onset  . Cancer Mother        lung  . Diabetes Mother   . Heart attack Father   . Diabetes Father   . Diabetes Sister     Social History   Tobacco Use  . Smoking status: Current Some Day Smoker    Last attempt to quit: 09/03/2006    Years since quitting: 13.6  . Smokeless tobacco: Never Used  Vaping Use  . Vaping Use: Never used  Substance Use Topics  . Alcohol use: No  . Drug use: No    Home Medications Prior to Admission medications   Medication Sig Start Date End Date Taking? Authorizing Provider  apixaban (ELIQUIS) 5 MG TABS tablet Take 1 tablet (5 mg total) by mouth 2 (two) times daily. 08/23/19   Strader, Fransisco Hertz, PA-C  aspirin 81 MG chewable tablet Chew 81 mg by mouth daily.    [provider]  atorvastatin (LIPITOR) 10 MG tablet Take 1 tablet (10 mg total) by mouth every evening. 08/03/19   Barton Dubois, MD  calcium acetate (PHOSLO) 667 MG capsule Take 1 capsule (667 mg total) by mouth 3 (three) times daily with meals. 02/21/19   Donnamae Jude, MD  calcium carbonate (TUMS - DOSED IN MG ELEMENTAL CALCIUM) 500 MG chewable tablet Chew 4 tablets (800 mg of elemental calcium total) by mouth 2  (two) times daily. 08/03/19   Barton Dubois, MD  carvedilol (COREG) 3.125 MG tablet Take 1 tablet (3.125 mg total) by mouth 2 (two) times daily with a meal. 08/23/19   Strader, [place] M, PA-C  LANTUS SOLOSTAR 100 UNIT/ML Solostar Pen Inject 10 Units into the skin at bedtime.  01/18/19   [provider]  lidocaine-prilocaine (EMLA) cream Apply 1 application topically See admin instructions. Applied three times weekly at dialysis on MWF 05/06/19   [provider]  multivitamin (RENA-VIT) TABS tablet Take 1 tablet by mouth daily. 08/04/18   [provider]  patiromer (VELTASSA) 8.4 g packet Take 1 packet (8.4 g total) by mouth  3 (three) times a week. On Tuesday, Thursday and Saturday 06/10/19   Kathie Dike, MD    Allergies    Tape  Review of Systems   Review of Systems  Constitutional: Negative for appetite change and fever.  HENT: Negative for congestion.   Eyes: Negative for visual disturbance.  Respiratory: Positive for chest tightness and shortness of breath. Negative for cough.   Cardiovascular: Positive for chest pain.  Gastrointestinal: Negative for abdominal pain, nausea and vomiting.  Genitourinary: Negative for dysuria and hematuria.  Musculoskeletal: Negative for arthralgias and myalgias.  Skin: Negative for rash.  Neurological: Negative for dizziness, weakness and headaches.   all other systems are negative except as noted in the HPI and PMH.    Physical Exam Updated Vital Signs BP (!) 158/102   Pulse (!) 117   Temp 99.4 F (37.4 C) (Oral)   Resp (!) 21   Ht 6' (1.829 m)   Wt 95.3 kg   SpO2 (!) 84%   BMI 28.48 kg/m   Physical Exam Vitals and nursing note reviewed.  Constitutional:      General: He is in acute distress.     Appearance: He is well-developed and well-nourished.     Comments: Moderate increased work of breathing  HENT:     Head: Normocephalic and atraumatic.     Mouth/Throat:     Mouth: Oropharynx is clear and moist.      Pharynx: No oropharyngeal exudate.  Eyes:     Extraocular Movements: EOM normal.     Conjunctiva/sclera: Conjunctivae normal.     Pupils: Pupils are equal, round, and reactive to light.  Neck:     Comments: No meningismus. Cardiovascular:     Rate and Rhythm: Regular rhythm. Tachycardia present.     Pulses: Intact distal pulses.     Heart sounds: Normal heart sounds. No murmur heard.   Pulmonary:     Effort: Pulmonary effort is normal. No respiratory distress.     Breath sounds: Rales present.  Abdominal:     Palpations: Abdomen is soft.     Tenderness: There is no abdominal tenderness. There is no guarding or rebound.  Musculoskeletal:        General: No tenderness or edema. Normal range of motion.     Cervical back: Normal range of motion and neck supple.     Comments: R BKA  Skin:    General: Skin is warm.  Neurological:     Mental Status: He is alert and oriented to person, place, and time.     Cranial Nerves: No cranial nerve deficit.     Motor: No abnormal muscle tone.     Coordination: Coordination normal.     Comments: No ataxia on finger to nose bilaterally. No pronator drift. 5/5 strength throughout. CN 2-12 intact.Equal grip strength. Sensation intact.   Psychiatric:        Mood and Affect: Mood and affect normal.        Behavior: Behavior normal.     ED Results / Procedures / Treatments   Labs (all labs ordered are listed, but only abnormal results are displayed) Labs Reviewed  CBC WITH DIFFERENTIAL/PLATELET - Abnormal; Notable for the following components:      Result Value   WBC 13.5 (*)    RBC 3.25 (*)    Hemoglobin 10.9 (*)    HCT 34.1 (*)    MCV 104.9 (*)    Neutro Abs 10.8 (*)    All other components within  normal limits  BASIC METABOLIC PANEL - Abnormal; Notable for the following components:   Potassium 6.0 (*)    CO2 19 (*)    BUN 77 (*)    Creatinine, Ser 14.06 (*)    Calcium 7.1 (*)    GFR, Estimated 4 (*)    Anion gap 18 (*)    All  other components within normal limits  BRAIN NATRIURETIC PEPTIDE - Abnormal; Notable for the following components:   B Natriuretic Peptide 1,971.0 (*)    All other components within normal limits  TROPONIN I (HIGH SENSITIVITY) - Abnormal; Notable for the following components:   Troponin I (High Sensitivity) 52 (*)    All other components within normal limits  RESP PANEL BY RT-PCR (FLU A&B, COVID) [id number]  I-STAT CHEM 8, ED    EKG None  Radiology DG Chest Portable 1 View  Result Date: 04/08/2020 CLINICAL DATA:  Chest pain EXAM: PORTABLE CHEST 1 VIEW COMPARISON:  December 13, 2019 FINDINGS: The heart size and mediastinal contours are mildly enlarged. There is prominence of the central pulmonary vasculature. No large airspace consolidation or pleural effusion. The visualized skeletal structures are unremarkable. IMPRESSION: Mild cardiomegaly and pulmonary vascular congestion. Electronically Signed   By: Prudencio Pair M.D.   On: 04/08/2020 02:00    Procedures .Critical Care Performed by: Ezequiel Essex, MD Authorized by: Ezequiel Essex, MD   Critical care provider statement:    Critical care time (minutes):  45   Critical care was necessary to treat or prevent imminent or life-threatening deterioration of the following conditions:  Metabolic crisis and respiratory failure   Critical care was time spent personally by me on the following activities:  Discussions with consultants, evaluation of patient's response to treatment, examination of patient, ordering and performing treatments and interventions, ordering and review of laboratory studies, ordering and review of radiographic studies, pulse oximetry, re-evaluation of patient's condition, obtaining history from patient or surrogate and review of old charts     Medications Ordered in ED Medications  aspirin chewable tablet 324 mg (has no administration in time range)    ED Course  I have reviewed the triage vital signs and the  nursing notes.  Pertinent labs & imaging results that were available during my care of the patient were reviewed by me and considered in my medical decision making (see chart for details).    MDM Rules/Calculators/A&P                          Shortness of breath, chest pain, missed dialysis.  EKG shows sinus tachycardia with left bundle branch block, similar to previous.  Edema on x-ray.  Potassium is 6.  Patient given calcium, Lokelma, insulin, D50, bicarb.  We will need admission for dialysis in the morning.  He is not hypoxic on nasal cannula. Troponin minimally elevated at 52.  Patient improving with above measures. He is comfortable on nasal cannula. He will need dialysis in the morning. Hyperkalemia is treated. New hypoxic respiratory failure due to missed dialysis, acute on chronic CHF and pulmonary edema.  Admission discussed with Dr. Josephine Cables.   ED ECG REPORT   Date: 04/08/2020  Rate: 116  Rhythm: sinus tachycardia  QRS Axis: left  Intervals: normal  ST/T Wave abnormalities: ST elevations anteriorly  Conduction Disutrbances:left bundle branch block  Narrative Interpretation:   Old EKG Reviewed: unchanged  I have personally reviewed the EKG tracing and agree with the computerized printout as noted.  Final Clinical Impression(s) / ED Diagnoses Final diagnoses:  Acute respiratory failure with hypoxia ([place])  Hyperkalemia    Rx / DC Orders ED Discharge Orders    None       Rancour, Annie Main, MD 04/08/20 [phone]

## 2020-04-08 NOTE — Consult Note (Signed)
Brendan Campbell Admit Date: 04/08/2020 04/08/2020 Brendan Campbell Requesting Physician:  Josephine Cables MD  Reason for Consult:  ESRD, Hyperkalemia, SOB, Pulm Edema HPI:  89M ESRD MWF LUE AVG [facility] presented overnight to the ED with complaints of fatigue.  Patient had a knee injury on Friday after his usual dialysis and was unable to attend on Monday 2/28.  He developed progressive dyspnea and presented for evaluation.  He is well-known to our service with a history of nonadherence, but it appears he has improved of late.  Labs are notable for a potassium of 6.0, HCO3 19, BUN 77.  Hemoglobin is 10.9.  Portable chest x-ray at time of presentation reveals mild cardiomegaly and pulmonary vascular congestion.  He is requiring 4 L nasal cannula.  No fevers.  Blood pressures are stable.  He received IV calcium, insulin and dextrose, furosemide, sodium bicarbonate, Lokelma.  Outpatient current HD orders unavailable, from previous admission: HD at [facility] MWF via AVG- 4 hours 15 min- L AVG- 300/600 1 K /2.5 calc bath; Gets heparin with HD- Also 800unitsepo and 1.5 of hectorol and 50 of venofer weekly   PMH Incudes:  DM2  HTN  Hyperlipidemia  Atrial fibrillation on apixaban  Balance of 12 systems is negative w/ exceptions as above  PMH  Past Medical History:  Diagnosis Date  . Anemia   . Anxiety   . Blood transfusion without reported diagnosis   . CHF (congestive heart failure) (HCC)    a. EF 25% by echo in 07/2019  . Chronic kidney disease   . Depression   . Diabetes mellitus without complication ([place])   . End stage renal disease ([place])    M/W/F [facility]  . Hyperlipidemia   . Neuropathy   . Peripheral vascular disease ([place])   . PTSD (post-traumatic stress disorder)    PSH  Past Surgical History:  Procedure Laterality Date  . A/V FISTULAGRAM N/A 10/09/2018   Procedure: A/V FISTULAGRAM;  Surgeon: Serafina Mitchell, MD;  Location: [place] CV LAB;  Service: Cardiovascular;   Laterality: N/A;  . AV FISTULA PLACEMENT Left 09/22/2016   Procedure: CREATION OF LEFT ARM ARTERIOVENOUS (AV) FISTULA;  Surgeon: Waynetta Sandy, MD;  Location: [place];  Service: Vascular;  Laterality: Left;  . AV FISTULA PLACEMENT Left 10/31/2017   Procedure: INSERTION OF ARTERIOVENOUS (AV) GORE-TEX 4-35m STETCH GRAFT LEFT ARM;  Surgeon: CWaynetta Sandy MD;  [place]  Service: Vascular;  Laterality: Left;  . BASCILIC VEIN TRANSPOSITION Left 08/17/2017   Procedure: SECOND STAGE BASILIC VEIN TRANSPOSITION LEFT ARM;  Surgeon: CWaynetta Sandy MD;  [place]  Service: Vascular;  Laterality: Left;  . BELOW KNEE LEG AMPUTATION Right   . CARDIOVERSION N/A 02/19/2019   Procedure: CARDIOVERSION;  Surgeon: HPixie Casino MD;  [facility];  Service: Cardiovascular;  Laterality: N/A;  . CHOLECYSTECTOMY    . FOOT SURGERY    . IR FLUORO GUIDE CV LINE RIGHT  05/16/2016  . IR FLUORO GUIDE CV LINE RIGHT  02/16/2019  . IR REMOVAL TUN CV CATH W/O FL  05/16/2016  . IR REMOVAL TUN CV CATH W/O FL  02/19/2019  . IR THROMBECTOMY AV FISTULA W/THROMBOLYSIS/PTA INC/SHUNT/IMG LEFT Left 11/09/2018  . IR THROMBECTOMY AV FISTULA W/THROMBOLYSIS/PTA INC/SHUNT/IMG LEFT Left 06/22/2019  . IR [place] ACCESS LEFT  11/09/2018  . IR [place] ACCESS LEFT  06/22/2019  . IR [place] V[place] ACCESS RIGHT  05/16/2016  . IR  US GUIDE VASC ACCESS RIGHT  02/16/2019  . PERIPHERAL VASCULAR BALLOON ANGIOPLASTY  10/09/2018   Procedure: PERIPHERAL VASCULAR BALLOON ANGIOPLASTY;  Surgeon: Serafina Mitchell, MD;  Location: [place] CV LAB;  Service: Cardiovascular;;  . TEE WITHOUT CARDIOVERSION N/A 02/19/2019   Procedure: TRANSESOPHAGEAL ECHOCARDIOGRAM (TEE);  Surgeon: [name], MD;  Location: [facility] ENDOSCOPY;  Service: Cardiovascular;  Laterality: N/A;   FH  Family History  Problem Relation Age of Onset  . Cancer Mother        lung  . Diabetes Mother   . Heart attack Father   .  Diabetes Father   . Diabetes Sister    SH  reports that he has been smoking. He has never used smokeless tobacco. He reports that he does not drink alcohol and does not use drugs. Allergies  Allergies  Allergen Reactions  . Tape Other (See Comments)    Pulls skin off  . Other    Home medications Prior to Admission medications   Medication Sig Start Date End Date Taking? Authorizing Provider  apixaban (ELIQUIS) 5 MG TABS tablet Take 1 tablet (5 mg total) by mouth 2 (two) times daily. 08/23/19   Strader, Fransisco Hertz, PA-C  aspirin 81 MG chewable tablet Chew 81 mg by mouth daily.    [provider]  atorvastatin (LIPITOR) 10 MG tablet Take 1 tablet (10 mg total) by mouth every evening. 08/03/19   Barton Dubois, MD  calcium acetate (PHOSLO) 667 MG capsule Take 1 capsule (667 mg total) by mouth 3 (three) times daily with meals. 02/21/19   Donnamae Jude, MD  calcium carbonate (TUMS - DOSED IN MG ELEMENTAL CALCIUM) 500 MG chewable tablet Chew 4 tablets (800 mg of elemental calcium total) by mouth 2 (two) times daily. 08/03/19   Barton Dubois, MD  carvedilol (COREG) 3.125 MG tablet Take 1 tablet (3.125 mg total) by mouth 2 (two) times daily with a meal. 08/23/19   Strader, [place] M, PA-C  LANTUS SOLOSTAR 100 UNIT/ML Solostar Pen Inject 10 Units into the skin at bedtime.  01/18/19   [provider]  lidocaine-prilocaine (EMLA) cream Apply 1 application topically See admin instructions. Applied three times weekly at dialysis on MWF 05/06/19   [provider]  multivitamin (RENA-VIT) TABS tablet Take 1 tablet by mouth daily. 08/04/18   [provider]  patiromer (VELTASSA) 8.4 g packet Take 1 packet (8.4 g total) by mouth 3 (three) times a week. On Tuesday, Thursday and Saturday 06/10/19   Kathie Dike, MD    Current Medications Scheduled Meds: . insulin aspart  0-5 Units Subcutaneous QHS  . insulin aspart  0-6 Units Subcutaneous TID WC   Continuous  Infusions: PRN Meds:.  CBC Recent Labs  Lab 04/08/20 0145  WBC 13.5*  NEUTROABS 10.8*  HGB 10.9*  HCT 34.1*  MCV 104.9*  PLT [id number]   Basic Metabolic Panel Recent Labs  Lab 04/08/20 0145  NA 136  K 6.0*  CL 99  CO2 19*  GLUCOSE 81  BUN 77*  CREATININE 14.06*  CALCIUM 7.1*    Physical Exam  Blood pressure (!) 107/54, pulse 78, temperature (!) 97.4 F (36.3 C), temperature source Oral, resp. rate 18, height 6' (1.829 m), weight 95.3 kg, SpO2 100 %. GEN: No acute distress, chronically ill-appearing ENT: NCAT EYES: EOMI CV: Regular, no rub PULM: Clear bilaterally, no crackles ABD: Soft, nontender SKIN: No rashes or lesions EXT: Right sided BKA and lower extremity, left leg without edema VASC: LUE  AV graft with bruit and thrill  Assessment 15M ESRD presenting with hyperkalemia, shortness of breath and mild pulmonary edema consistent with missing hemodialysis and volume overload.  1. ESRD MWF [facility] via LUE AVG 2. Hyperkalemia, moderate; treated conservatively and for HD today 3. Shortness of breath/volume overload/pulmonary edema 4. Anemia, hemoglobin greater than 10 5. 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 Hypertension, BP stable 7. Hyperlipidemia  Plan 1. HD today: 3.5h, 2-3L UF, 2K, Tight heparin, AVG 2. Will follow along  Brendan Campbell  04/08/2020, 10:07 AM

## 2020-04-08 NOTE — Progress Notes (Signed)
Mild flat troponin in setting of ESRD patient who missed HD presenting with volume overload/HF. F/u AM troponin, echo shows chronic severe LV dysfunction. Do not foresee any additional cardiac testing particularly if symptoms improve with dialysis. We will f/u with patient in AM   Carlyle Dolly MD

## 2020-04-08 NOTE — Progress Notes (Signed)
Date and time results received: 04/08/20 @ 0842  Test: troponin Critical Value: 100  Name of Provider Notified: Cherlyn Labella, MD  Orders Received? Or Actions Taken? N/a Deirdre Pippins, RN

## 2020-04-08 NOTE — Progress Notes (Signed)
*  PRELIMINARY RESULTS* Echocardiogram 2D Echocardiogram has been performed.  Leavy Cella 04/08/2020, 3:43 PM

## 2020-04-08 NOTE — Progress Notes (Signed)
PROGRESS NOTE    Patient: Brendan Campbell                            PCP: Monico Blitz, MD                    DOB: 04/28/1970            DOA: 04/08/2020 [id number]             DOS: 04/08/2020, 2:50 PM   LOS: 0 days   Date of Service: The patient was seen and examined on 04/08/2020  Subjective:   The patient was seen and examined this morning. Stable at this time. Complain of mild shortness of breath but no chest pain  Brief Narrative:   Brendan Campbell is a 50 y.o. male with medical history significant for ESRD on HD (MWF), HFrEF (LVEF of 25%-08/03/2019), T2DM, hyperlipidemia, A. 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  Patient states that he had a fall on Friday (2/25) and hurt his right knee, so he states that he missed his dialysis on Monday (2/28) due to still hurting with pain in his right knee and did not want to put weight on the leg.  However, yesterday (3/1), he started to notice worsening shortness of breath and inability to lay flat, so, he activated EMS and patient complained of central chest pain which was sharp and stabbing in route to the hospital.  Patient states that he has been compliant with his medication and he still makes urine.  He denies fever, chills, cough, vomiting or abdominal pain.  ED Course:  In the emergency department, he was tachypneic and tachycardic.  BP was 159/95.  Work-up in the ED showed leukocytosis, macrocytic anemia, hyperkalemia(K+ 6.0), BUN to creatinine 77/14.06, EGFR 4, anion gap 18, BNP 1971 (chronically elevated, 1,867 on 12/13/2019), troponin 52 > 71. Chest x-ray showed mild cardiomegaly and pulmonary vascular congestion. IV Lasix 40 mg x 1 was given, calcium gluconate to stabilize the heart was given due to hyperkalemia, IV dextrose and subcu insulin, sodium bicarbonate 1 amp, and Lokelma were given.  Hospitalist was  asked to admit patient for further evaluation and management.   Assessment & Plan:   Active Problems:   Hyperkalemia   Prolonged QT interval   Leukocytosis   Macrocytic anemia   Acute on chronic combined systolic and diastolic CHF (congestive heart failure) (HCC)   Atrial fibrillation with RVR (HCC)   Elevated troponin   Acute respiratory failure with hypoxia (HCC)     Acute respiratory failure with hypoxia possibly secondary to acute on chronic combined systolic and systolic CHF -Still complaining of shortness of breath -pending hemodialysis today ESRD on HD (MWF) Patient missed his dialysis on Monday (2/28), last dialysis was on Friday (2/25) Chest x-ray showed mild cardiomegaly and pulmonary vascular congestion. -Symptoms likely due to fluid overload Continue total input/output, daily weights and fluid restriction - IV Lasix 40 mg x 1 was given in the ED Nephrology will be consulted for dialysis this morning  Further Lasix held at this time due to soft BP Continue Cardiac diet  Echocardiogram showed  LVEF of 25%-08/03/2019 and LV grade 1 diastolic dysfunction.  Echocardiogram will be done in the morning   Hyperkalemia K+ 6.0; this is possibly due to ESRD Calcium gluconate was given IV  dextrose and subcu insulin, sodium bicarbonate 1 amp, and Lokelma were given. Continue telemetry Continue to monitor K+  Elevated troponin possibly secondary to type II demand ischemia Troponin x2-52 > 71 >>> 100, continues denies any chest pain Likely exacerbated by delayed hemodialysis, volume overload, Continue to trend troponin -Cardiology consulted for further evaluation for elevated troponin, subtle EKG changes  Prolonged QTc (548 ms) Avoid QT prolonging drugs Magnesium level will be checked Repeating EKG as needed, monitoring closely  Elevated BNP (chronic) BNP 1971 (chronically elevated, 1,867 on 12/13/2019) -Likely due to worsening kidney function, volume overload Not  much reliable due to end-stage renal disease Continue to manage as described above for CHF  A. fib with RVR CHA2DS2- VASc score   is = 3 Which is  equal to = 3.2% annual risk of stroke  Resume home Coreg when BP improves Continue home Eliquis  Macrocytic anemia (chronic) Vitamin B12 level on 8/18 was 444 Folate 10.7 on 09/25/2019  Leukocytosis possibly reactive WBC 13.5, no concern for acute infectious process identified Continue to monitor WBC with morning labs  T2DM Continue ISS and hypoglycemic protocol  Hyperlipidemia Continue home statin   DVT prophylaxis: Eliquis  Code Status: Full code  Family Communication: None at bedside  Disposition Plan:  Patient is from:home Anticipated DC to:SNF or family members home Anticipated DC date:1 days Anticipated DC barriers: Patient requires inpatient management of acute aspiration of CHF with hypoxia and need for hemodialysis.   Level of care: Telemetry   Procedures:   No admission procedures for hospital encounter.    Antimicrobials:  Anti-infectives (From admission, onward)   None       Medication:  . insulin aspart  0-5 Units Subcutaneous QHS  . insulin aspart  0-6 Units Subcutaneous TID WC       Objective:   Vitals:   04/08/20 1130 04/08/20 1200 04/08/20 1230 04/08/20 1300  BP: (!) 163/80 (!) 150/70 140/66 111/63  Pulse: 81 80 80 66  Resp: 20 20 20 20   Temp:      TempSrc:      SpO2:      Weight:      Height:        Intake/Output Summary (Last 24 hours) at 04/08/2020 1450 Last data filed at 04/08/2020 1312 Gross per 24 hour  Intake 240 ml  Output --  Net 240 ml   Filed Weights   04/08/20 0122 04/08/20 1020  Weight: 95.3 kg 99.8 kg     Examination:   Physical Exam  Constitution:  Alert, cooperative, no distress,  Appears calm and comfortable  Psychiatric: Normal and stable mood and affect, cognition intact,    HEENT: Normocephalic, PERRL, otherwise with in Normal limits  Chest:Chest symmetric Cardio vascular:  S1/S2, RRR, No murmure, No Rubs or Gallops  pulmonary: Clear to auscultation bilaterally, respirations unlabored, negative wheezes / crackles Abdomen: Soft, non-tender, non-distended, bowel sounds,no masses, no organomegaly Muscular skeletal: Limited exam - in bed, able to move all 4 extremities, Normal strength,  Neuro: CNII-XII intact. , normal motor and sensation, reflexes intact  Extremities: No pitting edema lower extremities, +2 pulses  Skin: Dry, warm to touch, negative for any Rashes, No open wounds Wounds: per nursing documentation    ------------------------------------------------------------------------------------------------------------------------------------------    LABs:  CBC Latest Ref Rng & Units 04/08/2020 12/13/2019 09/25/2019  WBC 4.0 - 10.5 K/uL 13.5(H) 13.9(H) 7.3  Hemoglobin 13.0 - 17.0 g/dL 10.9(L) 10.6(L) 11.2(L)  Hematocrit 39.0 - 52.0 % 34.1(L) 31.7(L) 35.8(L)  Platelets 150 -  400 K/uL 217 247 223   CMP Latest Ref Rng & Units 04/08/2020 04/08/2020 12/14/2019  Glucose 70 - 99 mg/dL 92 81 96  BUN 6 - 20 mg/dL 45(H) 77(H) 31(H)  Creatinine 0.61 - 1.24 mg/dL 6.88(H) 14.06(H) 7.01(H)  Sodium 135 - 145 mmol/L 135 136 134(L)  Potassium 3.5 - 5.1 mmol/L 3.9 6.0(H) 3.7  Chloride 98 - 111 mmol/L 96(L) 99 94(L)  CO2 22 - 32 mmol/L 24 19(L) 28  Calcium 8.9 - 10.3 mg/dL 7.6(L) 7.1(L) 7.1(L)  Total Protein 6.5 - 8.1 g/dL - - -  Total Bilirubin 0.3 - 1.2 mg/dL - - -  Alkaline Phos 38 - 126 U/L - - -  AST 15 - 41 U/L - - -  ALT 0 - 44 U/L - - -       Micro Results Recent Results (from the past 240 hour(s))  Resp Panel by RT-PCR (Flu A&B, Covid) Nasopharyngeal Swab     Status: None   Collection Time: 04/08/20  2:05 AM   Specimen: Nasopharyngeal Swab; Nasopharyngeal(NP) swabs in vial transport medium  Result Value Ref Range Status   SARS Coronavirus 2 by RT PCR  NEGATIVE NEGATIVE Final    Comment: (NOTE) SARS-CoV-2 target nucleic acids are NOT DETECTED.  The SARS-CoV-2 RNA is generally detectable in upper respiratory specimens during the acute phase of infection. The lowest concentration of SARS-CoV-2 viral copies this assay can detect is 138 copies/mL. A negative result does not preclude SARS-Cov-2 infection and should not be used as the sole basis for treatment or other patient management decisions. A negative result may occur with  improper specimen collection/handling, submission of specimen other than nasopharyngeal swab, presence of viral mutation(s) within the areas targeted by this assay, and inadequate number of viral copies(<138 copies/mL). A negative result must be combined with clinical observations, patient history, and epidemiological information. The expected result is Negative.  Fact Sheet for Patients:  EntrepreneurPulse.com.au  Fact Sheet for Healthcare Providers:  IncredibleEmployment.be  This test is no t yet approved or cleared by the [place] FDA and  has been authorized for detection and/or diagnosis of SARS-CoV-2 by FDA under an Emergency Use Authorization (EUA). This EUA will remain  in effect (meaning this test can be used) for the duration of the COVID-19 declaration under Section 564(b)(1) of the Act, 21 U.S.C.section 360bbb-3(b)(1), unless the authorization is terminated  or revoked sooner.       Influenza A by PCR NEGATIVE NEGATIVE Final   Influenza B by PCR NEGATIVE NEGATIVE Final    Comment: (NOTE) The Xpert Xpress SARS-CoV-2/FLU/RSV plus assay is intended as an aid in the diagnosis of influenza from Nasopharyngeal swab specimens and should not be used as a sole basis for treatment. Nasal washings and aspirates are unacceptable for Xpert Xpress SARS-CoV-2/FLU/RSV testing.  Fact Sheet for Patients: EntrepreneurPulse.com.au  Fact Sheet for  Healthcare Providers: IncredibleEmployment.be  This test is not yet approved or cleared by the [place] FDA and has been authorized for detection and/or diagnosis of SARS-CoV-2 by FDA under an Emergency Use Authorization (EUA). This EUA will remain in effect (meaning this test can be used) for the duration of the COVID-19 declaration under Section 564(b)(1) of the Act, 21 U.S.C. section 360bbb-3(b)(1), unless the authorization is terminated or revoked.  Performed at [facility], [address], [place], [place] [address]     Radiology Reports DG Chest Portable 1 View  Result Date: 04/08/2020 CLINICAL DATA:  Chest pain EXAM: PORTABLE CHEST 1 VIEW  COMPARISON:  December 13, 2019 FINDINGS: The heart size and mediastinal contours are mildly enlarged. There is prominence of the central pulmonary vasculature. No large airspace consolidation or pleural effusion. The visualized skeletal structures are unremarkable. IMPRESSION: Mild cardiomegaly and pulmonary vascular congestion. Electronically Signed   By: Prudencio Pair M.D.   On: 04/08/2020 02:00    SIGNED: Deatra Ludger, MD, FHM. [facility],  Pager (please use [email or web address] to page/text) Please use Epic Secure Chat for non-urgent communication (7AM-7PM)  If 7PM-7AM, please contact night-coverage [email or web address], 04/08/2020, 2:50 PM

## 2020-04-09 LAB — COMPREHENSIVE METABOLIC PANEL
ALT: 15 U/L (ref 0–44)
AST: 18 U/L (ref 15–41)
Albumin: 3.5 g/dL (ref 3.5–5.0)
Alkaline Phosphatase: 70 U/L (ref 38–126)
Anion gap: 15 (ref 5–15)
BUN: 49 mg/dL — ABNORMAL HIGH (ref 6–20)
CO2: 27 mmol/L (ref 22–32)
Calcium: 7.7 mg/dL — ABNORMAL LOW (ref 8.9–10.3)
Chloride: 95 mmol/L — ABNORMAL LOW (ref 98–111)
Creatinine, Ser: 8.96 mg/dL — ABNORMAL HIGH (ref 0.61–1.24)
GFR, Estimated: 7 mL/min — ABNORMAL LOW (ref >60)
Glucose, Bld: 77 mg/dL (ref 70–99)
Potassium: 5 mmol/L (ref 3.5–5.1)
Sodium: 137 mmol/L (ref 135–145)
Total Bilirubin: 1 mg/dL (ref 0.3–1.2)
Total Protein: 7 g/dL (ref 6.5–8.1)

## 2020-04-09 LAB — CBC
HCT: 34.8 % — ABNORMAL LOW (ref 39.0–52.0)
Hemoglobin: 11.1 g/dL — ABNORMAL LOW (ref 13.0–17.0)
MCH: 33.5 pg (ref 26.0–34.0)
MCHC: 31.9 g/dL (ref 30.0–36.0)
MCV: 105.1 fL — ABNORMAL HIGH (ref 80.0–100.0)
Platelets: 199 10*3/uL (ref 150–400)
RBC: 3.31 MIL/uL — ABNORMAL LOW (ref 4.22–5.81)
RDW: 12.9 % (ref 11.5–15.5)
WBC: 8.7 10*3/uL (ref 4.0–10.5)
nRBC: 0 % (ref 0.0–0.2)

## 2020-04-09 LAB — TROPONIN I (HIGH SENSITIVITY): Troponin I (High Sensitivity): 49 ng/L — ABNORMAL HIGH (ref <18)

## 2020-04-09 LAB — APTT: aPTT: 39 seconds — ABNORMAL HIGH (ref 24–36)

## 2020-04-09 LAB — GLUCOSE, CAPILLARY
Glucose-Capillary: 97 mg/dL (ref 70–99)
Glucose-Capillary: 114 mg/dL — ABNORMAL HIGH (ref 70–99)

## 2020-04-09 LAB — PROTIME-INR
INR: 1.3 — ABNORMAL HIGH (ref 0.8–1.2)
Prothrombin Time: 15.5 seconds — ABNORMAL HIGH (ref 11.4–15.2)

## 2020-04-09 MED ORDER — CARVEDILOL 3.125 MG PO TABS: 3.1250 mg | ORAL_TABLET | Freq: Two times a day (BID) | ORAL | 3 refills | Status: DC

## 2020-04-09 NOTE — Plan of Care (Signed)

## 2020-04-09 NOTE — Progress Notes (Signed)
Patient chart reviewed. 50 yo history of chronic systolic HF LVEF 123456, aflutter, HTN, DM2, ESRD, atypical chest pain,  noncompliance admitted with SOB after missing HD. Cardiology contacted regarding elevated troponin   WBC 13.5 Plt 217 Hgb 10.9 K 6 BUN 77 Cr 14 BNP 1970 Trop 52-->71-->100-->102-->49 COVID neg CXR mild cardiomegaly, vascular congestion EKG sinus tach, IVCD Echo LVEF 20-25%, indet dd, normal RV   Relatively mild troponin elevation in ESRD patient with chronic systolic HF presenting volume overloaded after missing HD session. Trop resolving peak 102, do not suspect ACS at this time. No plans for ischemic testing. In general his cardiology management has been significantly limited very poor medical compliance. Of not had an asystole arrest 05/2019 after missing HD session, appears he continues to have mixed HD compliance.    No additional cardiology recs at this time, keep his regular follow up   Carlyle Dolly MD

## 2020-04-09 NOTE — Progress Notes (Signed)
Admit: 04/08/2020 LOS: 1  23M ESRD presenting with hyperkalemia, shortness of breath and mild pulmonary edema consistent with missing hemodialysis and volume overload.  Subjective:  . HD yesterday, 2.6 L UF, uneventful . On room air, dyspnea resolved  03/02 0701 - 03/03 0700 In: 480 [P.O.:480] Out: 2600 [Urine:300]  Filed Weights   04/08/20 0122 04/08/20 1020 04/08/20 1350  Weight: 95.3 kg 99.8 kg 98 kg    Scheduled Meds: . apixaban  5 mg Oral BID  . atorvastatin  10 mg Oral QPM  . insulin aspart  0-5 Units Subcutaneous QHS  . insulin aspart  0-6 Units Subcutaneous TID WC  . melatonin  6 mg Oral QHS   Continuous Infusions: PRN Meds:.  Current Labs: reviewed    Physical Exam:  Blood pressure 125/75, pulse 77, temperature 98.2 F (36.8 C), temperature source Oral, resp. rate 18, height 6' (1.829 m), weight 98 kg, SpO2 100 %. GEN: No acute distress, chronically ill-appearing ENT: NCAT EYES: EOMI CV: Regular, no rub PULM: Clear bilaterally, no crackles ABD: Soft, nontender SKIN: No rashes or lesions EXT: Right sided BKA and lower extremity, left leg without edema VASC: LUE AV graft with bruit and thrill  A 1. ESRD MWF [facility] via LUE AVG 2. Hyperkalemia, moderate; treated conservatively and for HD today 3. Shortness of breath/volume overload/pulmonary edema/AoC sCHF exacerbation 4. Anemia, hemoglobin greater than 10 5. 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 Hypertension, BP stable 7. Hyperlipidemia  P . Should be okay for discharge today . We will check in to see if remains an inpatient tomorrow and arrange dialysis . Continue to encourage patient adherence to outpatient dialysis prescription . Medication Issues; o Preferred narcotic agents for pain control are hydromorphone, fentanyl, and methadone. Morphine should not be used.  o Baclofen should be avoided o Avoid  oral sodium phosphate and magnesium citrate based laxatives / bowel preps    Pearson Grippe MD 04/09/2020, 8:59 AM  Recent Labs  Lab 04/08/20 0145 04/08/20 1135 04/09/20 0519  NA 136 135 137  K 6.0* 3.9 5.0  CL 99 96* 95*  CO2 19* 24 27  GLUCOSE 81 92 77  BUN 77* 45* 49*  CREATININE 14.06* 6.88* 8.96*  CALCIUM 7.1* 7.6* 7.7*   Recent Labs  Lab 04/08/20 0145 04/09/20 0519  WBC 13.5* 8.7  NEUTROABS 10.8*  --   HGB 10.9* 11.1*  HCT 34.1* 34.8*  MCV 104.9* 105.1*  PLT 217 199

## 2020-04-09 NOTE — Clinical Social Work Note (Signed)
Patient discharging home. In need of transportation. [facility] contacted and transport arranged. TOC provided patient with $3 to cover cost of transport.  TOC signing off.    Hayden Kihara, Clydene Pugh, LCSW

## 2020-04-09 NOTE — Discharge Summary (Signed)
Physician Discharge Summary [facility]    Patient: Brendan Campbell                   Admit date: 04/08/2020   DOB: 07-03-1970             Discharge date:04/09/2020/9:57 AM [id number]                          PCP: Monico Blitz, MD  Disposition: HOME  Recommendations for Outpatient Follow-up:   . Follow up: in 1 week  Discharge Condition: Stable   Code Status:   Code Status: Full Code  Diet recommendation: Diabetic diet   Discharge Diagnoses:    Active Problems:   Hyperkalemia   Prolonged QT interval   Leukocytosis   Macrocytic anemia   Acute on chronic combined systolic and diastolic CHF (congestive heart failure) (HCC)   Atrial fibrillation with RVR (HCC)   Elevated troponin   Acute respiratory failure with hypoxia ([place])   History of Present Illness/ Hospital Course Brendan Campbell Summary:    Brendan Campbell a 50 y.o.malewith medical history significant forESRD on HD (MWF), HFrEF (LVEF of 25%-08/03/2019), T2DM, hyperlipidemia, A. 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 Patient states that he had a fall on Friday (2/25)and hurthis right knee, so he states that he missed his dialysis on Monday (2/28) due to still hurting withpain in his right knee and did not want to put weight on the leg. However, yesterday (3/1), he started to notice worsening shortness of breath and inability to lay flat, so, he activated EMS and patient complainedof central chest pain which was sharp and stabbing in route to the hospital. Patient states that he has been compliant with his medication and he still makes urine. He denies fever, chills, cough, vomiting or abdominal pain.  ED Course: In the emergency department, he was tachypneic and tachycardic. BP was 159/95. Work-up in the ED showed leukocytosis, macrocytic anemia, hyperkalemia(K+ 6.0), BUN to  creatinine 77/14.06, EGFR 4, anion gap 18, BNP 1970 (chronically elevated,1,867 on 12/13/2019), troponin 52>71. Chest x-ray showed mild cardiomegaly and pulmonary vascular congestion. IV Lasix 40 mg x 1 was given, calcium gluconate to stabilize the heart was given due to hyperkalemia, IV dextrose and subcu insulin, sodium bicarbonate 1 amp, and Lokelma were given. Hospitalist was asked to admit patient for further evaluation and management.    Acute respiratory failure with hypoxia possibly secondary to acute on chronic combined systolic and systolic CHF -Still complaining of shortness of breath -pending hemodialysis today ESRD on HD (MWF) Patient missed his dialysis on Monday (2/28), last dialysis was on Friday (2/25) Chest x-ray showed mild cardiomegaly and pulmonary vascular congestion. -Symptoms likely due to fluid overload Continue total input/output, daily weights and fluid restriction - IV Lasix 40 mg x 1 was given in the ED Nephrology was consulted, status post hemodialysis on 04/08/2020, tolerated well, improved symptoms  Further Lasix held at this time due to soft BP  Echocardiogramshowed LVEF of 25%-08/03/2019 and LV grade 1 diastolic dysfunction. Echocardiogram will be done in the morning  Hyperkalemia K+ 6.0;this is possibly due to ESRD Calcium gluconate was given IV dextrose and subcu insulin, sodium bicarbonate 1 amp, and Lokelma were given. Continue telemetry Continue to monitorK+ Status post hemodialysis-potassium improved to 5.0 this a.m.  Elevated troponin possibly secondary to type II demand ischemia Troponin  x2-52>71 >>> 100, continues denies any chest pain Likely exacerbated by delayed hemodialysis, volume overload,  -Cardiology was  consulted for further evaluation for elevated troponin, subtle EKG changes No further work-up recommended, continue current management, echo was reviewed  Prolonged QTc(548 ms) Avoid QT prolonging drugs Magnesium  level will be checked Repeating EKG as needed,  QTc interval improved  Elevated BNP (chronic) BNP 1970 (chronically elevated,1,867 on 12/13/2019) -Likely due to worsening kidney function, volume overload Not much reliable due to end-stage renal disease Continue to manage as described above for CHF  A. fib with RVR CHA2DS2- VASc score is = 3Which is equal to = 3.2% annual risk of stroke Resume home Coreg when BP improves Continue home Eliquis  Macrocytic anemia (chronic) Vitamin B12 level on 8/18 was444 Folate 10.7 on8/18/2021  Leukocytosis possibly reactive WBC 13.5,>> 8.7  no concern for acute infectious process identified Continue to monitor WBC with morning labs  T2DM Diabetic diet Patient instructed on healthcare diabetic diet Strict glycemic control, continue home regimen insulin Lantus Hyperlipidemia Continue home statin    Code Status:Full code Family Communication:None at bedside  Disposition Plan: Patient:home       Discharge Instructions:   Discharge Instructions    Activity as tolerated - No restrictions   Complete by: As directed    Diet - low sodium heart healthy   Complete by: As directed    Discharge instructions   Complete by: As directed    Continue current meds, hemodialysis as scheduled..   Increase activity slowly   Complete by: As directed        Medication List    TAKE these medications   apixaban 5 MG Tabs tablet Commonly known as: ELIQUIS Take 1 tablet (5 mg total) by mouth 2 (two) times daily.   aspirin 81 MG chewable tablet Chew 81 mg by mouth daily.   atorvastatin 10 MG tablet Commonly known as: LIPITOR Take 1 tablet (10 mg total) by mouth every evening.   calcium acetate 667 MG capsule Commonly known as: PHOSLO Take 1 capsule (667 mg total) by mouth 3 (three) times daily with meals.   calcium carbonate 500 MG chewable tablet Commonly known as: TUMS - dosed in mg  elemental calcium Chew 4 tablets (800 mg of elemental calcium total) by mouth 2 (two) times daily.   carvedilol 3.125 MG tablet Commonly known as: Coreg Take 1 tablet (3.125 mg total) by mouth 2 (two) times daily with a meal.   DULoxetine 30 MG capsule Commonly known as: CYMBALTA Take 30 mg by mouth 2 (two) times daily.   Lantus SoloStar 100 UNIT/ML Solostar Pen Generic drug: insulin glargine Inject 10 Units into the skin at bedtime.   lidocaine-prilocaine cream Commonly known as: EMLA Apply 1 application topically See admin instructions. Applied three times weekly at dialysis on MWF   multivitamin Tabs tablet Take 1 tablet by mouth daily.   Veltassa 8.4 g packet Generic drug: patiromer Take 1 packet (8.4 g total) by mouth 3 (three) times a week. On Tuesday, Thursday and Saturday       Allergies  Allergen Reactions  . Tape Other (See Comments)    Pulls skin off  . Other      Procedures /Studies:   DG Chest Portable 1 View  Result Date: 04/08/2020 CLINICAL DATA:  Chest pain EXAM: PORTABLE CHEST 1 VIEW COMPARISON:  December 13, 2019 FINDINGS: The heart size and mediastinal contours are mildly enlarged. There is prominence of the central pulmonary vasculature. No  large airspace consolidation or pleural effusion. The visualized skeletal structures are unremarkable. IMPRESSION: Mild cardiomegaly and pulmonary vascular congestion. Electronically Signed   By: Prudencio Pair M.D.   On: 04/08/2020 02:00   ECHOCARDIOGRAM COMPLETE  Result Date: 04/08/2020    ECHOCARDIOGRAM REPORT   Patient Name:   MARIANA GOYTIA Date of Exam: 04/08/2020 Medical Rec #:  [id number]      Height:       72.0 in Accession #:    [id number]     Weight:       220.0 lb Date of Birth:  [birth year]       BSA:          2.219 m Patient Age:    77 years       BP:           111/63 mmHg Patient Gender: M              HR:           66 bpm. Exam Location:  [facility] Procedure: 2D Echo Indications:    CHF-Acute Systolic O27.03   History:        Patient has prior history of Echocardiogram examinations, most                 recent 08/03/2019. Arrythmias:Atrial Fibrillation and Atrial                 Flutter; Risk Factors:Dyslipidemia and Current Smoker. Elevated                 Troponin, ESRD.  Sonographer:    Leavy Cella RDCS (AE) Referring Phys: [id number] OLADAPO ADEFESO IMPRESSIONS  1. Left ventricular ejection fraction, by estimation, is 20 to 25%. The left ventricle has severely decreased function. The left ventricle demonstrates global hypokinesis. There is severe left ventricular hypertrophy. Left ventricular diastolic parameters are indeterminate.  2. Right ventricular systolic function is normal. The right ventricular size is normal.  3. The mitral valve is normal in structure. No evidence of mitral valve regurgitation. No evidence of mitral stenosis.  4. The aortic valve has an indeterminant number of cusps. There is moderate calcification of the aortic valve. There is moderate thickening of the aortic valve. Aortic valve regurgitation is not visualized. No aortic stenosis is present.  5. The inferior vena cava is normal in size with greater than 50% respiratory variability, suggesting right atrial pressure of 3 mmHg. FINDINGS  Left Ventricle: Left ventricular ejection fraction, by estimation, is 20 to 25%. The left ventricle has severely decreased function. The left ventricle demonstrates global hypokinesis. The left ventricular internal cavity size was normal in size. There is severe left ventricular hypertrophy. Left ventricular diastolic parameters are indeterminate. Right Ventricle: The right ventricular size is normal. No increase in right ventricular wall thickness. Right ventricular systolic function is normal. Left Atrium: Left atrial size was normal in size. Right Atrium: Right atrial size was normal in size. Pericardium: There is no evidence of pericardial effusion. Mitral Valve: The mitral valve is normal in structure.  No evidence of mitral valve regurgitation. No evidence of mitral valve stenosis. Tricuspid Valve: The tricuspid valve is normal in structure. Tricuspid valve regurgitation is not demonstrated. No evidence of tricuspid stenosis. Aortic Valve: The aortic valve has an indeterminant number of cusps. There is moderate calcification of the aortic valve. There is moderate thickening of the aortic valve. There is moderate aortic valve annular calcification. Aortic valve regurgitation is not visualized. No aortic stenosis is  present. Aortic valve mean gradient measures 3.7 mmHg. Aortic valve peak gradient measures 8.1 mmHg. Aortic valve area, by VTI measures 2.04 cm. Pulmonic Valve: The pulmonic valve was not well visualized. Pulmonic valve regurgitation is not visualized. No evidence of pulmonic stenosis. Aorta: The aortic root is normal in size and structure. Pulmonary Artery: Indeterminant PASP, inadequate TR jet. Venous: The inferior vena cava is normal in size with greater than 50% respiratory variability, suggesting right atrial pressure of 3 mmHg. IAS/Shunts: No atrial level shunt detected by color flow Doppler.  LEFT VENTRICLE PLAX 2D LVIDd:         4.03 cm  Diastology LVIDs:         3.11 cm  LV e' medial:    9.57 cm/s LV PW:         1.50 cm  LV E/e' medial:  10.3 LV IVS:        1.50 cm  LV e' lateral:   3.15 cm/s LVOT diam:     1.80 cm  LV E/e' lateral: 31.3 LV SV:         43 LV SV Index:   19 LVOT Area:     2.54 cm  RIGHT VENTRICLE TAPSE (M-mode): 2.1 cm LEFT ATRIUM             Index       RIGHT ATRIUM          Index LA diam:        3.20 cm 1.44 cm/m  RA Area:     9.50 cm LA Vol (A2C):   45.5 ml 20.51 ml/m RA Volume:   19.10 ml 8.61 ml/m LA Vol (A4C):   52.3 ml 23.57 ml/m LA Biplane Vol: 49.2 ml 22.17 ml/m  AORTIC VALVE AV Area (Vmax):    1.82 cm AV Area (Vmean):   1.78 cm AV Area (VTI):     2.04 cm AV Vmax:           142.52 cm/s AV Vmean:          86.808 cm/s AV VTI:            0.211 m AV Peak Grad:       8.1 mmHg AV Mean Grad:      3.7 mmHg LVOT Vmax:         101.74 cm/s LVOT Vmean:        60.856 cm/s LVOT VTI:          0.169 m LVOT/AV VTI ratio: 0.80  AORTA Ao Root diam: 3.60 cm MITRAL VALVE MV Area (PHT): 4.83 cm    SHUNTS MV Decel Time: 157 msec    Systemic VTI:  0.17 m MV E velocity: 98.50 cm/s  Systemic Diam: 1.80 cm Carlyle Dolly MD Electronically signed by Carlyle Dolly MD Signature Date/Time: 04/08/2020/4:01:36 PM    Final      Subjective:   Patient was seen and examined 04/09/2020, 9:57 AM Patient stable today. No acute distress.  No issues overnight Stable for discharge.  Discharge Exam:    Vitals:   04/08/20 1330 04/08/20 1350 04/08/20 2234 04/09/20 0417  BP: 100/60 (!) 150/66 (!) 115/59 125/75  Pulse: 80 84 81 77  Resp: _0 Temp:  98 F (36.7 C) 98.5 F (36.9 C) 98.2 F (36.8 C)  TempSrc:  Oral Oral Oral  SpO2:  100% 100% 100%  Weight:  98 kg    Height:        General: Pt lying  comfortably in bed & appears in no obvious distress. Cardiovascular: S1 & S2 heard, RRR, S1/S2 +. No murmurs, rubs, gallops or clicks. No JVD or pedal edema. Respiratory: Clear to auscultation without wheezing, rhonchi or crackles. No increased work of breathing. Abdominal:  Non-distended, non-tender & soft. No organomegaly or masses appreciated. Normal bowel sounds heard. CNS: Alert and oriented. No focal deficits. Extremities: no edema, no cyanosis      The results of significant diagnostics from this hospitalization (including imaging, microbiology, ancillary and laboratory) are listed below for reference.      Microbiology:   Recent Results (from the past 240 hour(s))  Resp Panel by RT-PCR (Flu A&B, Covid) Nasopharyngeal Swab     Status: None   Collection Time: 04/08/20  2:05 AM   Specimen: Nasopharyngeal Swab; Nasopharyngeal(NP) swabs in vial transport medium  Result Value Ref Range Status   SARS Coronavirus 2 by RT PCR NEGATIVE NEGATIVE Final    Comment:  (NOTE) SARS-CoV-2 target nucleic acids are NOT DETECTED.  The SARS-CoV-2 RNA is generally detectable in upper respiratory specimens during the acute phase of infection. The lowest concentration of SARS-CoV-2 viral copies this assay can detect is 138 copies/mL. A negative result does not preclude SARS-Cov-2 infection and should not be used as the sole basis for treatment or other patient management decisions. A negative result may occur with  improper specimen collection/handling, submission of specimen other than nasopharyngeal swab, presence of viral mutation(s) within the areas targeted by this assay, and inadequate number of viral copies(<138 copies/mL). A negative result must be combined with clinical observations, patient history, and epidemiological information. The expected result is Negative.  Fact Sheet for Patients:  EntrepreneurPulse.com.au  Fact Sheet for Healthcare Providers:  IncredibleEmployment.be  This test is no t yet approved or cleared by the [place] FDA and  has been authorized for detection and/or diagnosis of SARS-CoV-2 by FDA under an Emergency Use Authorization (EUA). This EUA will remain  in effect (meaning this test can be used) for the duration of the COVID-19 declaration under Section 564(b)(1) of the Act, 21 U.S.C.section 360bbb-3(b)(1), unless the authorization is terminated  or revoked sooner.       Influenza A by PCR NEGATIVE NEGATIVE Final   Influenza B by PCR NEGATIVE NEGATIVE Final    Comment: (NOTE) The Xpert Xpress SARS-CoV-2/FLU/RSV plus assay is intended as an aid in the diagnosis of influenza from Nasopharyngeal swab specimens and should not be used as a sole basis for treatment. Nasal washings and aspirates are unacceptable for Xpert Xpress SARS-CoV-2/FLU/RSV testing.  Fact Sheet for Patients: EntrepreneurPulse.com.au  Fact Sheet for Healthcare  Providers: IncredibleEmployment.be  This test is not yet approved or cleared by the [place] FDA and has been authorized for detection and/or diagnosis of SARS-CoV-2 by FDA under an Emergency Use Authorization (EUA). This EUA will remain in effect (meaning this test can be used) for the duration of the COVID-19 declaration under Section 564(b)(1) of the Act, 21 U.S.C. section 360bbb-3(b)(1), unless the authorization is terminated or revoked.  Performed at [facility], [address] [place], [place] [address]      Labs:   CBC: Recent Labs  Lab 04/08/20 0145 04/09/20 0519  WBC 13.5* 8.7  NEUTROABS 10.8*  --   HGB 10.9* 11.1*  HCT 34.1* 34.8*  MCV 104.9* 105.1*  PLT 217 017   Basic Metabolic Panel: Recent Labs  Lab 04/08/20 0145 04/08/20 1135 04/09/20 0519  NA 136 135 137  K 6.0* 3.9 5.0  CL 99 96* 95*  CO2 19* 24 27  GLUCOSE 81 92 77  BUN 77* 45* 49*  CREATININE 14.06* 6.88* 8.96*  CALCIUM 7.1* 7.6* 7.7*   Liver Function Tests: Recent Labs  Lab 04/09/20 0519  AST 18  ALT 15  ALKPHOS 70  BILITOT 1.0  PROT 7.0  ALBUMIN 3.5   BNP (last 3 results) Recent Labs    06/07/19 1125 12/13/19 1322 04/08/20 0145  BNP 3,441.0* 1,867.0* 1,971.0*   Cardiac Enzymes: No results for input(s): CKTOTAL, CKMB, CKMBINDEX, TROPONINI in the last 168 hours. CBG: Recent Labs  Lab 04/08/20 0735 04/08/20 1103 04/08/20 1611 04/08/20 2151 04/09/20 0749  GLUCAP 100* 100* 121* 93 97   Hgb A1c Recent Labs    04/08/20 0732  HGBA1C 5.3   Lipid Profile No results for input(s): CHOL, HDL, LDLCALC, TRIG, CHOLHDL, LDLDIRECT in the last 72 hours. Thyroid function studies No results for input(s): TSH, T4TOTAL, T3FREE, THYROIDAB in the last 72 hours.  Invalid input(s): FREET3 Anemia work up No results for input(s): VITAMINB12, FOLATE, FERRITIN, TIBC, IRON, RETICCTPCT in the last 72 hours. Urinalysis No results found for: COLORURINE, APPEARANCEUR,  LABSPEC, [place], GLUCOSEU, HGBUR, BILIRUBINUR, KETONESUR, PROTEINUR, UROBILINOGEN, NITRITE, LEUKOCYTESUR       Time coordinating discharge: Over 45 minutes  SIGNED: Deatra William, MD, FACP, FHM. [facility],  Please use [email or web address] to Page If 7PM-7AM, please contact night-coverage [email or web address] 04/09/2020, 9:57 AM

## 2020-07-03 ENCOUNTER — Emergency Department (HOSPITAL_COMMUNITY): Payer: Medicaid Other

## 2020-07-03 ENCOUNTER — Encounter (HOSPITAL_COMMUNITY): Payer: Self-pay

## 2020-07-03 ENCOUNTER — Other Ambulatory Visit: Payer: Self-pay

## 2020-07-03 ENCOUNTER — Observation Stay (HOSPITAL_COMMUNITY)
Admission: EM | Admit: 2020-07-03 | Discharge: 2020-07-04 | Disposition: A | Discharge status: Home / Self Care | Payer: Medicaid Other | Attending: Family Medicine | Admitting: Family Medicine

## 2020-07-03 DIAGNOSIS — N186 End stage renal disease: Secondary | ICD-10-CM | POA: Diagnosis present

## 2020-07-03 DIAGNOSIS — Z20822 Contact with and (suspected) exposure to covid-19: Secondary | ICD-10-CM | POA: Diagnosis not present

## 2020-07-03 DIAGNOSIS — E876 Hypokalemia: Secondary | ICD-10-CM | POA: Diagnosis not present

## 2020-07-03 DIAGNOSIS — J9600 Acute respiratory failure, unspecified whether with hypoxia or hypercapnia: Secondary | ICD-10-CM | POA: Diagnosis present

## 2020-07-03 DIAGNOSIS — Z794 Long term (current) use of insulin: Secondary | ICD-10-CM | POA: Diagnosis not present

## 2020-07-03 DIAGNOSIS — E1122 Type 2 diabetes mellitus with diabetic chronic kidney disease: Secondary | ICD-10-CM | POA: Diagnosis present

## 2020-07-03 DIAGNOSIS — Z79899 Other long term (current) drug therapy: Secondary | ICD-10-CM | POA: Insufficient documentation

## 2020-07-03 DIAGNOSIS — I509 Heart failure, unspecified: Secondary | ICD-10-CM

## 2020-07-03 DIAGNOSIS — I5042 Chronic combined systolic (congestive) and diastolic (congestive) heart failure: Secondary | ICD-10-CM

## 2020-07-03 DIAGNOSIS — Z89512 Acquired absence of left leg below knee: Secondary | ICD-10-CM

## 2020-07-03 DIAGNOSIS — F17200 Nicotine dependence, unspecified, uncomplicated: Secondary | ICD-10-CM | POA: Diagnosis not present

## 2020-07-03 DIAGNOSIS — D631 Anemia in chronic kidney disease: Secondary | ICD-10-CM

## 2020-07-03 DIAGNOSIS — Z992 Dependence on renal dialysis: Secondary | ICD-10-CM

## 2020-07-03 DIAGNOSIS — E785 Hyperlipidemia, unspecified: Secondary | ICD-10-CM | POA: Diagnosis present

## 2020-07-03 DIAGNOSIS — M47816 Spondylosis without myelopathy or radiculopathy, lumbar region: Secondary | ICD-10-CM

## 2020-07-03 DIAGNOSIS — I5043 Acute on chronic combined systolic (congestive) and diastolic (congestive) heart failure: Secondary | ICD-10-CM | POA: Diagnosis present

## 2020-07-03 DIAGNOSIS — J9601 Acute respiratory failure with hypoxia: Secondary | ICD-10-CM | POA: Diagnosis not present

## 2020-07-03 DIAGNOSIS — Z7901 Long term (current) use of anticoagulants: Secondary | ICD-10-CM | POA: Insufficient documentation

## 2020-07-03 DIAGNOSIS — J9621 Acute and chronic respiratory failure with hypoxia: Secondary | ICD-10-CM | POA: Diagnosis present

## 2020-07-03 DIAGNOSIS — J810 Acute pulmonary edema: Secondary | ICD-10-CM | POA: Diagnosis not present

## 2020-07-03 DIAGNOSIS — R0602 Shortness of breath: Secondary | ICD-10-CM | POA: Diagnosis present

## 2020-07-03 DIAGNOSIS — I132 Hypertensive heart and chronic kidney disease with heart failure and with stage 5 chronic kidney disease, or end stage renal disease: Secondary | ICD-10-CM | POA: Insufficient documentation

## 2020-07-03 DIAGNOSIS — I482 Chronic atrial fibrillation, unspecified: Secondary | ICD-10-CM | POA: Diagnosis not present

## 2020-07-03 DIAGNOSIS — E875 Hyperkalemia: Secondary | ICD-10-CM | POA: Diagnosis present

## 2020-07-03 DIAGNOSIS — Z89511 Acquired absence of right leg below knee: Secondary | ICD-10-CM

## 2020-07-03 DIAGNOSIS — E213 Hyperparathyroidism, unspecified: Secondary | ICD-10-CM | POA: Diagnosis present

## 2020-07-03 LAB — COMPREHENSIVE METABOLIC PANEL
ALT: 14 U/L (ref 0–44)
AST: 16 U/L (ref 15–41)
Albumin: 3.7 g/dL (ref 3.5–5.0)
Alkaline Phosphatase: 77 U/L (ref 38–126)
Anion gap: 14 (ref 5–15)
BUN: 62 mg/dL — ABNORMAL HIGH (ref 6–20)
CO2: 23 mmol/L (ref 22–32)
Calcium: 7.5 mg/dL — ABNORMAL LOW (ref 8.9–10.3)
Chloride: 101 mmol/L (ref 98–111)
Creatinine, Ser: 12.4 mg/dL — ABNORMAL HIGH (ref 0.61–1.24)
GFR, Estimated: 4 mL/min — ABNORMAL LOW (ref >60)
Glucose, Bld: 84 mg/dL (ref 70–99)
Potassium: 6.5 mmol/L (ref 3.5–5.1)
Sodium: 138 mmol/L (ref 135–145)
Total Bilirubin: 0.8 mg/dL (ref 0.3–1.2)
Total Protein: 7.1 g/dL (ref 6.5–8.1)

## 2020-07-03 LAB — BRAIN NATRIURETIC PEPTIDE: B Natriuretic Peptide: 2185 pg/mL — ABNORMAL HIGH (ref 0.0–100.0)

## 2020-07-03 LAB — I-STAT CHEM 8, ED
BUN: 55 mg/dL — ABNORMAL HIGH (ref 6–20)
Calcium, Ion: 0.84 mmol/L — CL (ref 1.15–1.40)
Chloride: 107 mmol/L (ref 98–111)
Creatinine, Ser: 12.9 mg/dL — ABNORMAL HIGH (ref 0.61–1.24)
Glucose, Bld: 88 mg/dL (ref 70–99)
HCT: 30 % — ABNORMAL LOW (ref 39.0–52.0)
Hemoglobin: 10.2 g/dL — ABNORMAL LOW (ref 13.0–17.0)
Potassium: 6 mmol/L — ABNORMAL HIGH (ref 3.5–5.1)
Sodium: 138 mmol/L (ref 135–145)
TCO2: 22 mmol/L (ref 22–32)

## 2020-07-03 LAB — CBC
HCT: 33.5 % — ABNORMAL LOW (ref 39.0–52.0)
Hemoglobin: 10.5 g/dL — ABNORMAL LOW (ref 13.0–17.0)
MCH: 33.4 pg (ref 26.0–34.0)
MCHC: 31.3 g/dL (ref 30.0–36.0)
MCV: 106.7 fL — ABNORMAL HIGH (ref 80.0–100.0)
Platelets: 252 10*3/uL (ref 150–400)
RBC: 3.14 MIL/uL — ABNORMAL LOW (ref 4.22–5.81)
RDW: 13 % (ref 11.5–15.5)
WBC: 14.9 10*3/uL — ABNORMAL HIGH (ref 4.0–10.5)
nRBC: 0 % (ref 0.0–0.2)

## 2020-07-03 LAB — BLOOD GAS, VENOUS
Acid-base deficit: 1.7 mmol/L (ref 0.0–2.0)
Bicarbonate: 22.7 mmol/L (ref 20.0–28.0)
FIO2: 80
O2 Saturation: 98.6 %
Patient temperature: 37
pCO2, Ven: 46.5 mmHg (ref 44.0–60.0)
pH, Ven: 7.324 (ref 7.250–7.430)
pO2, Ven: 177 mmHg — ABNORMAL HIGH (ref 32.0–45.0)

## 2020-07-03 LAB — TROPONIN I (HIGH SENSITIVITY)
Troponin I (High Sensitivity): 38 ng/L — ABNORMAL HIGH (ref <18)
Troponin I (High Sensitivity): 39 ng/L — ABNORMAL HIGH (ref <18)

## 2020-07-03 LAB — CBG MONITORING, ED
Glucose-Capillary: 95 mg/dL (ref 70–99)
Glucose-Capillary: 90 mg/dL (ref 70–99)

## 2020-07-03 LAB — RESP PANEL BY RT-PCR (FLU A&B, COVID) ARPGX2
Influenza A by PCR: NEGATIVE
Influenza B by PCR: NEGATIVE
SARS Coronavirus 2 by RT PCR: NEGATIVE

## 2020-07-03 IMAGING — DX DG CHEST 1V PORT
1 series · 2 of 2 positions shown · non-contrast
Comparison: [DATE]

CLINICAL DATA: Shortness of breath

EXAM:
PORTABLE CHEST 1 VIEW

[Series 1: chest ap · 0.14mm/px · 2 of 2 slices shown]
[im 1/2]
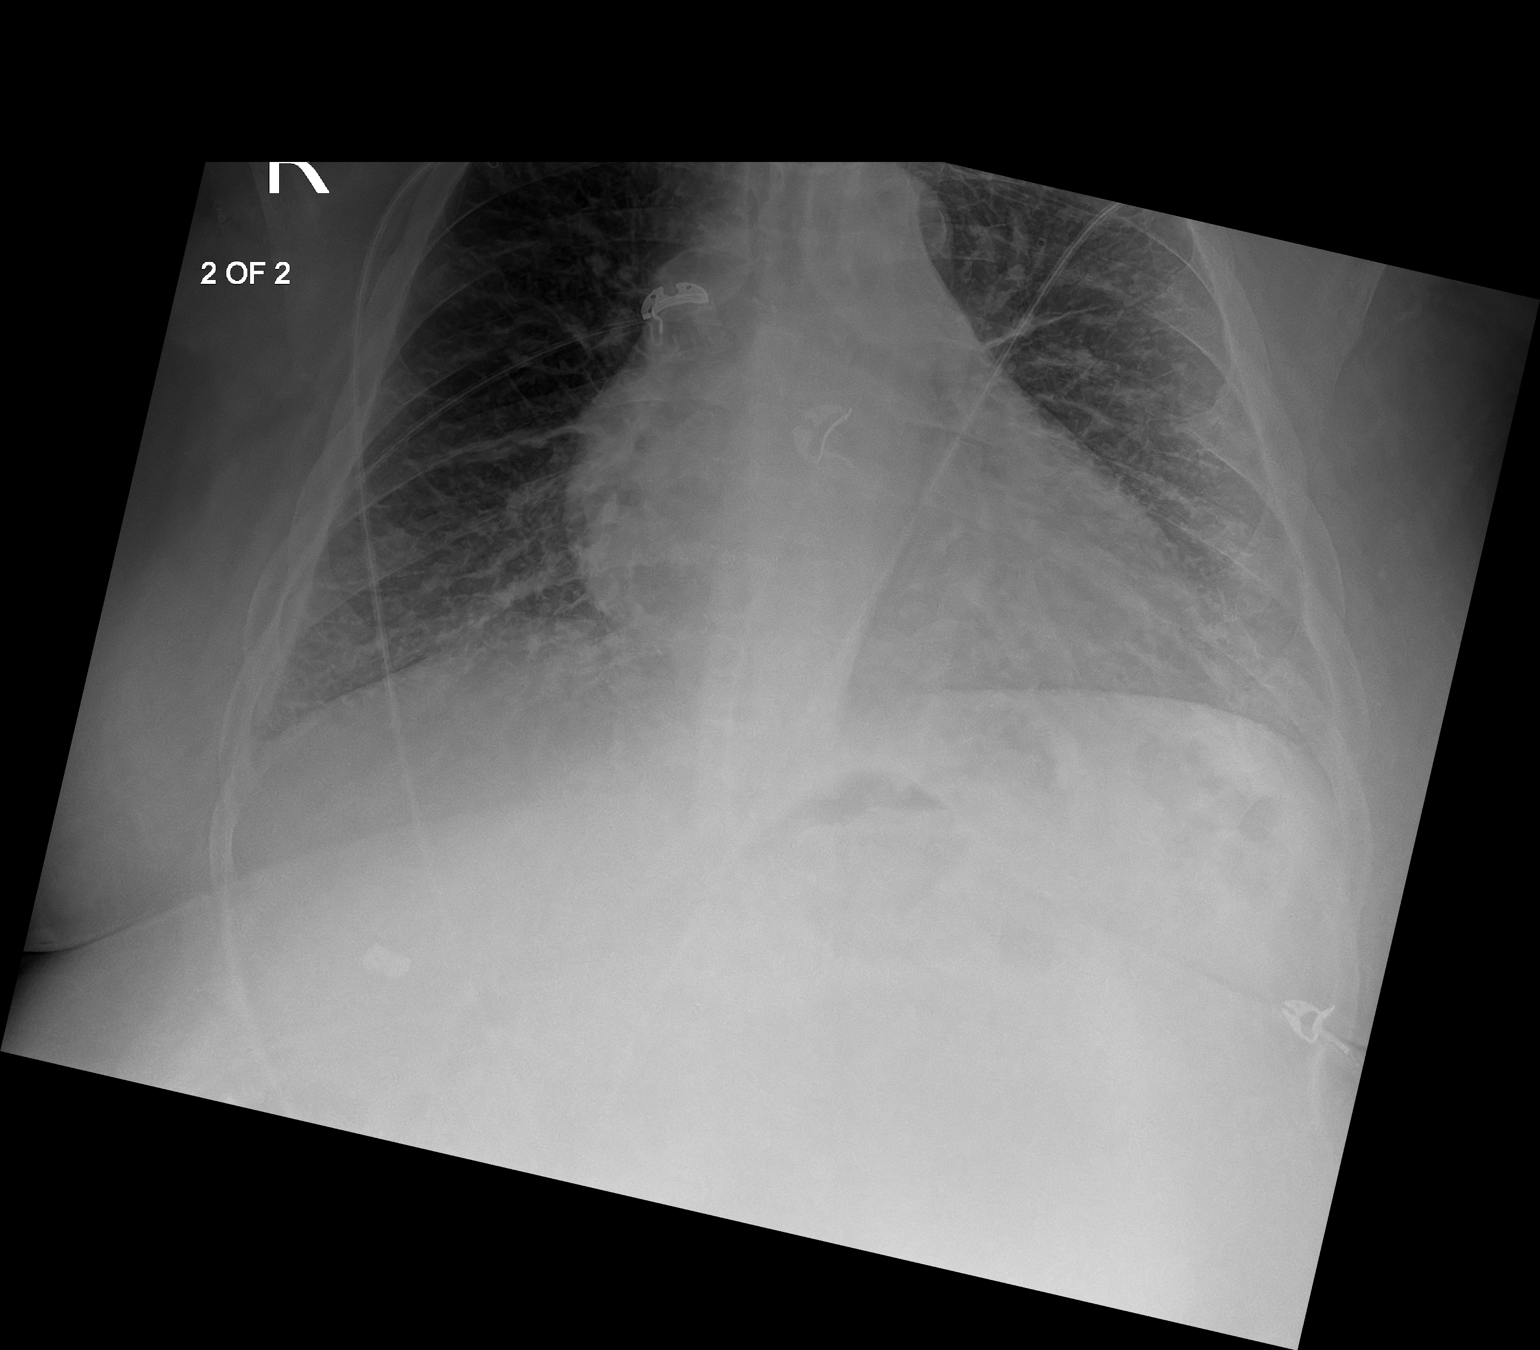
[im 2/2]
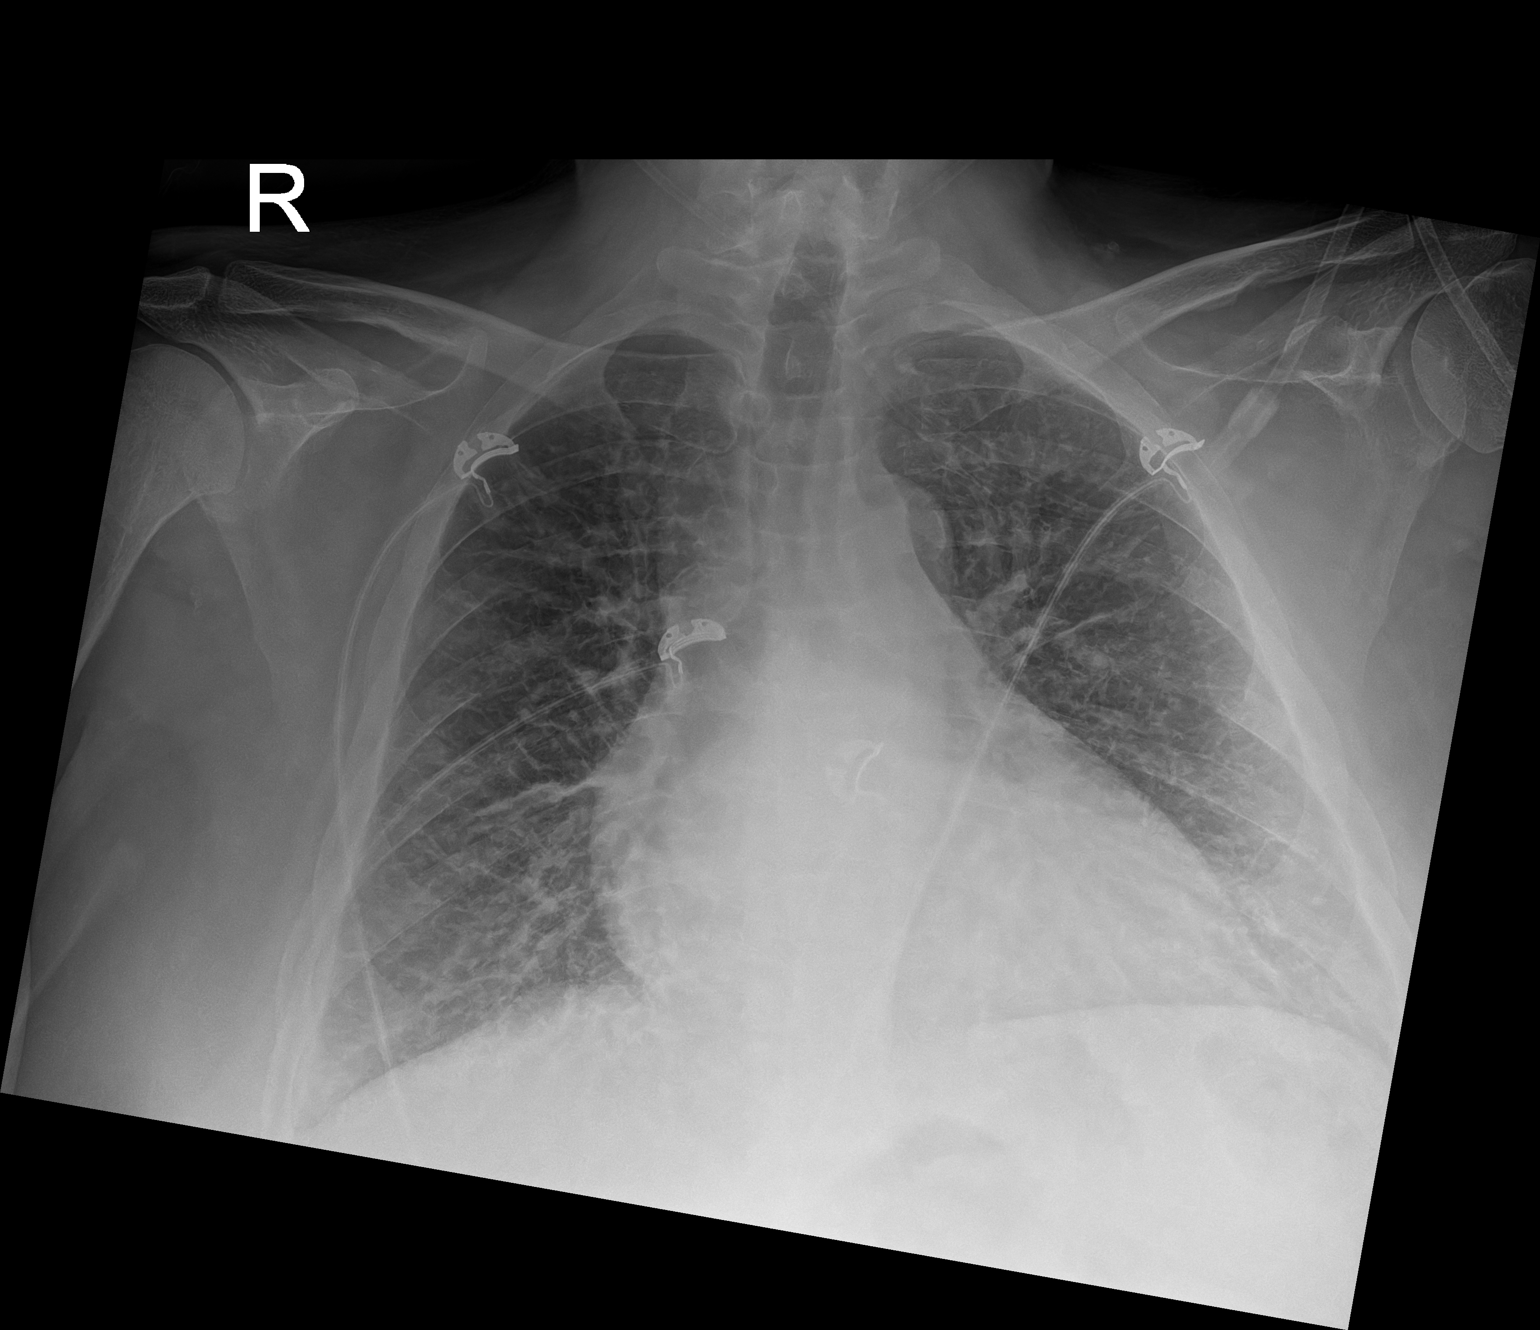

[2 of 2 positions shown; findings below may reference images not displayed]

FINDINGS: Cardiomegaly. Diffuse interstitial opacity with Kerley lines and
vascular pedicle widening. No visible effusion or pneumothorax.
IMPRESSION: CHF.

## 2020-07-03 MED ORDER — SODIUM BICARBONATE 8.4 % IV SOLN: 50.0000 meq | Freq: Once | INTRAVENOUS | Status: AC

## 2020-07-03 MED ORDER — PENTAFLUOROPROP-TETRAFLUOROETH EX AERO: 1.0000 "application " | INHALATION_SPRAY | CUTANEOUS | Status: DC | PRN

## 2020-07-03 MED ORDER — CALCIUM GLUCONATE-NACL 1-0.675 GM/50ML-% IV SOLN
1.0000 g | Freq: Once | INTRAVENOUS | Status: DC
  Filled 2020-07-03: qty 50

## 2020-07-03 MED ORDER — CALCIUM GLUCONATE-NACL 1-0.675 GM/50ML-% IV SOLN
1.0000 g | Freq: Once | INTRAVENOUS | Status: AC
  Administered 2020-07-03: 1000 mg via INTRAVENOUS
  Filled 2020-07-03: qty 50

## 2020-07-03 MED ORDER — CALCIUM ACETATE (PHOS BINDER) 667 MG PO CAPS
667.0000 mg | ORAL_CAPSULE | Freq: Three times a day (TID) | ORAL | Status: DC
  Administered 2020-07-03 – 2020-07-04 (×3): 667 mg via ORAL
  Filled 2020-07-03 (×3): qty 1

## 2020-07-03 MED ORDER — SODIUM BICARBONATE 8.4 % IV SOLN
INTRAVENOUS | Status: AC
  Administered 2020-07-03: 50 meq via INTRAVENOUS
  Filled 2020-07-03: qty 50

## 2020-07-03 MED ORDER — DULOXETINE HCL 30 MG PO CPEP
30.0000 mg | ORAL_CAPSULE | Freq: Two times a day (BID) | ORAL | Status: DC
  Administered 2020-07-03 – 2020-07-04 (×2): 30 mg via ORAL
  Filled 2020-07-03 (×2): qty 1

## 2020-07-03 MED ORDER — SODIUM CHLORIDE 0.9% FLUSH: 3.0000 mL | Freq: Two times a day (BID) | INTRAVENOUS | Status: DC

## 2020-07-03 MED ORDER — LIDOCAINE-PRILOCAINE 2.5-2.5 % EX CREA: 1.0000 "application " | TOPICAL_CREAM | CUTANEOUS | Status: DC

## 2020-07-03 MED ORDER — RENA-VITE PO TABS
1.0000 | ORAL_TABLET | Freq: Every day | ORAL | Status: DC
  Administered 2020-07-04: 1 via ORAL
  Filled 2020-07-03: qty 1

## 2020-07-03 MED ORDER — ONDANSETRON HCL 4 MG PO TABS: 4.0000 mg | ORAL_TABLET | Freq: Four times a day (QID) | ORAL | Status: DC | PRN

## 2020-07-03 MED ORDER — LIDOCAINE-PRILOCAINE 2.5-2.5 % EX CREA: 1.0000 "application " | TOPICAL_CREAM | CUTANEOUS | Status: DC | PRN

## 2020-07-03 MED ORDER — HEPARIN SODIUM (PORCINE) 5000 UNIT/ML IJ SOLN: 5000.0000 [IU] | Freq: Three times a day (TID) | INTRAMUSCULAR | Status: DC

## 2020-07-03 MED ORDER — DEXTROSE 50 % IV SOLN
1.0000 | Freq: Once | INTRAVENOUS | Status: AC
  Administered 2020-07-03: 50 mL via INTRAVENOUS
  Filled 2020-07-03: qty 50

## 2020-07-03 MED ORDER — HEPARIN SODIUM (PORCINE) 1000 UNIT/ML DIALYSIS: 20.0000 [IU]/kg | INTRAMUSCULAR | Status: DC | PRN

## 2020-07-03 MED ORDER — APIXABAN 5 MG PO TABS
5.0000 mg | ORAL_TABLET | Freq: Two times a day (BID) | ORAL | Status: DC
  Administered 2020-07-03 – 2020-07-04 (×2): 5 mg via ORAL
  Filled 2020-07-03 (×2): qty 1

## 2020-07-03 MED ORDER — INSULIN GLARGINE 100 UNIT/ML ~~LOC~~ SOLN
10.0000 [IU] | Freq: Every day | SUBCUTANEOUS | Status: DC
  Administered 2020-07-03: 10 [IU] via SUBCUTANEOUS
  Filled 2020-07-03 (×3): qty 0.1

## 2020-07-03 MED ORDER — INSULIN ASPART 100 UNIT/ML IV SOLN
5.0000 [IU] | Freq: Once | INTRAVENOUS | Status: AC
  Administered 2020-07-03: 5 [IU] via INTRAVENOUS

## 2020-07-03 MED ORDER — CHLORHEXIDINE GLUCONATE CLOTH 2 % EX PADS: 6.0000 | MEDICATED_PAD | Freq: Every day | CUTANEOUS | Status: DC

## 2020-07-03 MED ORDER — LEVALBUTEROL HCL 0.63 MG/3ML IN NEBU: 0.6300 mg | INHALATION_SOLUTION | Freq: Four times a day (QID) | RESPIRATORY_TRACT | Status: DC | PRN

## 2020-07-03 MED ORDER — SODIUM CHLORIDE 0.9% FLUSH
3.0000 mL | Freq: Two times a day (BID) | INTRAVENOUS | Status: DC
  Administered 2020-07-03 – 2020-07-04 (×2): 3 mL via INTRAVENOUS

## 2020-07-03 MED ORDER — ATORVASTATIN CALCIUM 10 MG PO TABS
10.0000 mg | ORAL_TABLET | Freq: Every evening | ORAL | Status: DC
  Administered 2020-07-03: 10 mg via ORAL
  Filled 2020-07-03: qty 1

## 2020-07-03 MED ORDER — ONDANSETRON HCL 4 MG/2ML IJ SOLN: 4.0000 mg | Freq: Four times a day (QID) | INTRAMUSCULAR | Status: DC | PRN

## 2020-07-03 MED ORDER — ACETAMINOPHEN 325 MG PO TABS: 650.0000 mg | ORAL_TABLET | Freq: Four times a day (QID) | ORAL | Status: DC | PRN

## 2020-07-03 MED ORDER — INSULIN ASPART 100 UNIT/ML IJ SOLN: 0.0000 [IU] | Freq: Three times a day (TID) | INTRAMUSCULAR | Status: DC

## 2020-07-03 MED ORDER — HYDRALAZINE HCL 20 MG/ML IJ SOLN: 10.0000 mg | INTRAMUSCULAR | Status: DC | PRN

## 2020-07-03 MED ORDER — IPRATROPIUM BROMIDE 0.02 % IN SOLN: 0.5000 mg | Freq: Four times a day (QID) | RESPIRATORY_TRACT | Status: DC | PRN

## 2020-07-03 MED ORDER — ACETAMINOPHEN 650 MG RE SUPP: 650.0000 mg | Freq: Four times a day (QID) | RECTAL | Status: DC | PRN

## 2020-07-03 MED ORDER — HYDROMORPHONE HCL 1 MG/ML IJ SOLN
0.5000 mg | INTRAMUSCULAR | Status: DC | PRN
  Filled 2020-07-03: qty 1

## 2020-07-03 MED ORDER — SODIUM CHLORIDE 0.9 % IV SOLN: 100.0000 mL | INTRAVENOUS | Status: DC | PRN

## 2020-07-03 MED ORDER — SODIUM CHLORIDE 0.9 % IV SOLN: 250.0000 mL | INTRAVENOUS | Status: DC | PRN

## 2020-07-03 MED ORDER — ASPIRIN 81 MG PO CHEW
81.0000 mg | CHEWABLE_TABLET | Freq: Every day | ORAL | Status: DC
  Administered 2020-07-04: 81 mg via ORAL
  Filled 2020-07-03: qty 1

## 2020-07-03 MED ORDER — PATIROMER SORBITEX CALCIUM 8.4 G PO PACK
8.4000 g | PACK | ORAL | Status: DC
  Filled 2020-07-03 (×2): qty 1

## 2020-07-03 MED ORDER — BISACODYL 5 MG PO TBEC: 5.0000 mg | DELAYED_RELEASE_TABLET | Freq: Every day | ORAL | Status: DC | PRN

## 2020-07-03 MED ORDER — OXYCODONE HCL 5 MG PO TABS
5.0000 mg | ORAL_TABLET | ORAL | Status: DC | PRN
  Administered 2020-07-03 – 2020-07-04 (×4): 5 mg via ORAL
  Filled 2020-07-03 (×4): qty 1

## 2020-07-03 MED ORDER — SENNOSIDES-DOCUSATE SODIUM 8.6-50 MG PO TABS: 1.0000 | ORAL_TABLET | Freq: Every evening | ORAL | Status: DC | PRN

## 2020-07-03 MED ORDER — MAGNESIUM CITRATE PO SOLN: 1.0000 | Freq: Once | ORAL | Status: DC | PRN

## 2020-07-03 MED ORDER — FENTANYL CITRATE (PF) 100 MCG/2ML IJ SOLN
50.0000 ug | Freq: Once | INTRAMUSCULAR | Status: AC
Start: 2020-07-03 — End: 2020-07-03
  Administered 2020-07-03: 50 ug via INTRAVENOUS
  Filled 2020-07-03: qty 2

## 2020-07-03 MED ORDER — CARVEDILOL 3.125 MG PO TABS
3.1250 mg | ORAL_TABLET | Freq: Two times a day (BID) | ORAL | Status: DC
  Administered 2020-07-03 – 2020-07-04 (×2): 3.125 mg via ORAL
  Filled 2020-07-03 (×2): qty 1

## 2020-07-03 MED ORDER — SODIUM CHLORIDE 0.9% FLUSH: 3.0000 mL | INTRAVENOUS | Status: DC | PRN

## 2020-07-03 MED ORDER — LIDOCAINE HCL (PF) 1 % IJ SOLN: 5.0000 mL | INTRAMUSCULAR | Status: DC | PRN

## 2020-07-03 MED ORDER — CALCIUM CARBONATE ANTACID 500 MG PO CHEW
800.0000 mg | CHEWABLE_TABLET | Freq: Two times a day (BID) | ORAL | Status: DC
  Administered 2020-07-03 – 2020-07-04 (×2): 800 mg via ORAL
  Filled 2020-07-03 (×2): qty 4

## 2020-07-03 NOTE — Evaluation (Signed)
Physical Therapy Evaluation Patient Details Name: Brendan Campbell MRN: [id number] DOB: Mar 31, 1970 Today's Date: 07/03/2020   History of Present Illness  Brendan Campbell is a 50 y.o. 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 fib, chronic anemia, hyperlipidemia, diabetes mellitus type II, presenting with shortness of breath.  Patient missed his hemodialysis since last Wednesday.  He has not had a dialysis for the past 5 days.  Reported progressively getting worse with shortness of breath, has become hypoxic on room air.  Also reporting some nonradiating chest tightness.    Clinical Impression  Patient presents on 15 LPM NRB, put on room air with SpO2 at 92-94% while seated at bedside, demonstrates slightly labored slow cadence without loss of balance using his tri-pod cane and RLE BKA prosthetic leg, limited mostly due to c/o fatigue and mild SOB, SpO2 dropped to 83% on room air during ambulation, put on 2 LPM with SpO2 maintaining at 92%, but desaturated to 87% while seated at bedside and put back to bed after therapy.  Patient put on 4 LPM [place] after therapy with SpO2 at 91-92% - RN notified.  Patient will benefit from continued physical therapy in hospital and recommended venue below to increase strength, balance, endurance for safe ADLs and gait.    Follow Up Recommendations Home health PT;Supervision - Intermittent    Equipment Recommendations  None recommended by PT    Recommendations for Other Services       Precautions / Restrictions Precautions Precautions: Fall Required Braces or Orthoses: Other Brace Other Brace: R BKA prosthetic leg Restrictions Weight Bearing Restrictions: No      Mobility  Bed Mobility Overal bed mobility: Modified Independent             General bed mobility comments: HOB raised, increased time, slightly labored movement     Transfers Overall transfer level: Needs assistance Equipment used: Straight cane;Lofstrands Transfers: Sit to/from [employer] to Stand: Supervision Stand pivot transfers: Supervision       General transfer comment: increased time, labored movement  Ambulation/Gait Ambulation/Gait assistance: Min guard Gait Distance (Feet): 20 Feet Assistive device: Straight cane Gait Pattern/deviations: Decreased step length - left;Decreased stance time - right;Decreased stride length Gait velocity: decreased   General Gait Details: slow labored slightly unsteady cadence without loss of balance, limited secondary to fatigue  Stairs            Wheelchair Mobility    Modified Rankin (Stroke Patients Only)       Balance Overall balance assessment: Needs assistance Sitting-balance support: Feet supported;No upper extremity supported Sitting balance-Leahy Scale: Good Sitting balance - Comments: seated at EOB   Standing balance support: During functional activity;Single extremity supported Standing balance-Leahy Scale: Fair Standing balance comment: using tri-pod cane                             Pertinent Vitals/Pain Pain Assessment: No/denies pain    Home Living Family/patient expects to be discharged to:: Private residence Living Arrangements: Non-relatives/Friends Available Help at Discharge: Friend(s);Available 24 hours/day Type of Home: House Home Access: Ramped entrance     Home Layout: One level Home Equipment: Cane - single point;Wheelchair - manual;Shower seat      Prior Function Level of Independence: Independent with assistive device(s)         Comments: Community ambulator using tri-pod cane and  RLE BKA prostethic leg     Hand Dominance   Dominant Hand: Right    Extremity/Trunk Assessment   Upper Extremity Assessment Upper Extremity Assessment: Defer to OT evaluation    Lower Extremity Assessment Lower Extremity  Assessment: Generalized weakness    Cervical / Trunk Assessment Cervical / Trunk Assessment: Normal  Communication   Communication: No difficulties  Cognition Arousal/Alertness: Awake/alert Behavior During Therapy: WFL for tasks assessed/performed Overall Cognitive Status: Within Functional Limits for tasks assessed                                        General Comments      Exercises     Assessment/Plan    PT Assessment Patient needs continued PT services  PT Problem List Decreased strength;Decreased activity tolerance;Decreased balance;Decreased mobility       PT Treatment Interventions DME instruction;Gait training;Stair training;Functional mobility training;Therapeutic activities;Therapeutic exercise;Balance training;Patient/family education    PT Goals (Current goals can be found in the Care Plan section)  Acute Rehab PT Goals Patient Stated Goal: return home with friends to assist PT Goal Formulation: With patient Time For Goal Achievement: 07/07/20 Potential to Achieve Goals: Good    Frequency Min 3X/week   Barriers to discharge        Co-evaluation               AM-PAC PT "6 Clicks" Mobility  Outcome Measure Help needed turning from your back to your side while in a flat bed without using bedrails?: None Help needed moving from lying on your back to sitting on the side of a flat bed without using bedrails?: A Little Help needed moving to and from a bed to a chair (including a wheelchair)?: A Little Help needed standing up from a chair using your arms (e.g., wheelchair or bedside chair)?: A Little Help needed to walk in hospital room?: A Little Help needed climbing 3-5 steps with a railing? : A Little 6 Click Score: 19    End of Session Equipment Utilized During Treatment: Oxygen Activity Tolerance: Patient tolerated treatment well;Patient limited by fatigue Patient left: in bed;with call bell/phone within reach Nurse  Communication: Mobility status PT Visit Diagnosis: Unsteadiness on feet (R26.81);Other abnormalities of gait and mobility (R26.89);Muscle weakness (generalized) (M62.81)    Time: [id number] PT Time Calculation (min) (ACUTE ONLY): 30 min   Charges:   PT Evaluation $PT Eval Moderate Complexity: 1 Mod PT Treatments $Therapeutic Activity: 23-37 mins        11:19 AM, 07/03/20 Lonell Grandchild, MPT Physical Therapist with [facility] [phone] office [phone] mobile phone

## 2020-07-03 NOTE — ED Notes (Signed)
Pt in bed, pt states that his chest pain has stopped, but he still has some back pain, pain med given.

## 2020-07-03 NOTE — ED Notes (Signed)
Talked with hospitalist, Shahmehdi, pt is planned for dialysis, hold all meds until after dialysis, per hospitalist instructions.

## 2020-07-03 NOTE — ED Notes (Signed)
Pt in bed, pt states that his pain is better, states that his pain is now a 5/10, states that he has no needs at this time.  Pt denies chest pain.

## 2020-07-03 NOTE — ED Notes (Signed)
Pt refusing HFNC from respiratory. Placed back on 15L NRB at this time. Dr. Sedonia Small made aware.

## 2020-07-03 NOTE — ED Notes (Signed)
Pt in bed, dialysis rn texted to say that she is ready for pt in room 227, pt states that his back pain is starting to get worse again, states that his pain is an 8/10, states that he is ready to go to his dialysis

## 2020-07-03 NOTE — ED Provider Notes (Signed)
[facility] Emergency Department Provider Note MRN:  [id number]  Arrival date & time: 07/03/20     Chief Complaint   Shortness of Breath   History of Present Illness   Brendan Campbell is a 50 y.o. year-old male with a history of CHF, diabetes, ESRD presenting to the ED with chief complaint of shortness of breath.  Worsening shortness of breath over the past 1 to 2 days.  Has not had dialysis for 5 days, was feeling unwell and missed dialysis on Wednesday.  Patient has not vaccinated for COVID.  Endorsing worsening shortness of breath and was found to be hypoxic on room air with EMS.  Also endorsing chest tightness, denies abdominal pain.  Review of Systems  A complete 10 system review of systems was obtained and all systems are negative except as noted in the HPI and PMH.   Patient's Health History    Past Medical History:  Diagnosis Date  . Anemia   . Anxiety   . Blood transfusion without reported diagnosis   . CHF (congestive heart failure) (HCC)    a. EF 25% by echo in 07/2019  . Chronic kidney disease   . Depression   . Diabetes mellitus without complication ([place])   . End stage renal disease ([place])    M/W/F [facility]  . Hyperlipidemia   . Neuropathy   . Peripheral vascular disease ([place])   . PTSD (post-traumatic stress disorder)     Past Surgical History:  Procedure Laterality Date  . A/V FISTULAGRAM N/A 10/09/2018   Procedure: A/V FISTULAGRAM;  Surgeon: Serafina Mitchell, MD;  Location: [place] [facility];  Service: Cardiovascular;  Laterality: N/A;  . AV FISTULA PLACEMENT Left 09/22/2016   Procedure: CREATION OF LEFT ARM ARTERIOVENOUS (AV) FISTULA;  Surgeon: Waynetta Sandy, MD;  Location: [place];  Service: Vascular;  Laterality: Left;  . AV FISTULA PLACEMENT Left 10/31/2017   Procedure: INSERTION OF ARTERIOVENOUS (AV) GORE-TEX 4-19m STETCH GRAFT LEFT ARM;  Surgeon: CWaynetta Sandy MD;  [place] [facility]  Service: Vascular;   Laterality: Left;  . BASCILIC VEIN TRANSPOSITION Left 08/17/2017   Procedure: SECOND STAGE BASILIC VEIN TRANSPOSITION LEFT ARM;  Surgeon: CWaynetta Sandy MD;  [place]  Service: Vascular;  Laterality: Left;  . BELOW KNEE LEG AMPUTATION Right   . CARDIOVERSION N/A 02/19/2019   Procedure: CARDIOVERSION;  Surgeon: HPixie Casino MD;  [facility];  Service: Cardiovascular;  Laterality: N/A;  . CHOLECYSTECTOMY    . FOOT SURGERY    . IR FLUORO GUIDE CV LINE RIGHT  05/16/2016  . IR FLUORO GUIDE CV LINE RIGHT  02/16/2019  . IR REMOVAL TUN CV CATH W/O FL  05/16/2016  . IR REMOVAL TUN CV CATH W/O FL  02/19/2019  . IR THROMBECTOMY AV FISTULA W/THROMBOLYSIS/PTA INC/SHUNT/IMG LEFT Left 11/09/2018  . IR THROMBECTOMY AV FISTULA W/THROMBOLYSIS/PTA INC/SHUNT/IMG LEFT Left 06/22/2019  . IR [place] ACCESS LEFT  11/09/2018  . IR [place] ACCESS LEFT  06/22/2019  . IR [place] VASC ACCESS RIGHT  05/16/2016  . IR [place] VASC ACCESS RIGHT  02/16/2019  . PERIPHERAL VASCULAR BALLOON ANGIOPLASTY  10/09/2018   Procedure: PERIPHERAL VASCULAR BALLOON ANGIOPLASTY;  Surgeon: BSerafina Mitchell MD;  [facility];  Service: Cardiovascular;;  . TEE WITHOUT CARDIOVERSION N/A 02/19/2019   Procedure: TRANSESOPHAGEAL ECHOCARDIOGRAM (TEE);  Surgeon: HPixie Casino MD;  [place] [facility]  Service: Cardiovascular;  Laterality: N/A;  Family History  Problem Relation Age of Onset  . Cancer Mother        lung  . Diabetes Mother   . Heart attack Father   . Diabetes Father   . Diabetes Sister     Social History   Socioeconomic History  . Marital status: Single    Spouse name: Not on file  . Number of children: Not on file  . Years of education: Not on file  . Highest education level: Not on file  Occupational History  . Not on file  Tobacco Use  . Smoking status: Current Some Day Smoker    Last attempt to quit: 09/03/2006    Years since quitting: 13.8  . Smokeless  tobacco: Never Used  Vaping Use  . Vaping Use: Never used  Substance and Sexual Activity  . Alcohol use: No  . Drug use: No  . Sexual activity: Not Currently  Other Topics Concern  . Not on file  Social History Narrative   Pt lives in [place] with roommate.  Not followed by an outpatient provider currently, but is trying to schedule an appointment with [facility] Ledger.   Social Determinants of Health   Financial Resource Strain: Not on file  Food Insecurity: Not on file  Transportation Needs: Not on file  Physical Activity: Not on file  Stress: Not on file  Social Connections: Not on file  Intimate Partner Violence: Not on file     Physical Exam   Vitals:   07/03/20 0555 07/03/20 0600  BP:  (!) 174/100  Pulse:  (!) 114  Resp:  (!) 24  Temp:    SpO2: 92% 92%    CONSTITUTIONAL: Chronically ill-appearing, NAD NEURO:  Alert and oriented x 3, no focal deficits EYES:  eyes equal and reactive ENT/NECK:  no LAD, no JVD CARDIO: Tachycardic rate, well-perfused, normal S1 and S2 PULM:  CTAB no wheezing or rhonchi, tachypneic, persistent cough GI/GU:  normal bowel sounds, non-distended, non-tender MSK/SPINE:  No gross deformities, no edema SKIN:  no rash, atraumatic PSYCH:  Appropriate speech and behavior  *Additional and/or pertinent findings included in MDM below  Diagnostic and Interventional Summary    EKG Interpretation  Date/Time:  Friday Jul 03 2020 05:18:25 EDT Ventricular Rate:  113 PR Interval:  169 QRS Duration: 129 QT Interval:  350 QTC Calculation: 480 R Axis:   -61 Text Interpretation: Sinus tachycardia Left bundle branch block Confirmed by Gerlene Fee [phone]) on 07/03/2020 5:52:20 AM      Labs Reviewed  CBC - Abnormal; Notable for the following components:      Result Value   WBC 14.9 (*)    RBC 3.14 (*)    Hemoglobin 10.5 (*)    HCT 33.5 (*)    MCV 106.7 (*)    All other components within normal limits  I-STAT CHEM 8, ED -  Abnormal; Notable for the following components:   Potassium 6.0 (*)    BUN 55 (*)    Creatinine, Ser 12.90 (*)    Calcium, Ion 0.84 (*)    Hemoglobin 10.2 (*)    HCT 30.0 (*)    All other components within normal limits  RESP PANEL BY RT-PCR (FLU A&B, COVID) [id number]  COMPREHENSIVE METABOLIC PANEL  BRAIN NATRIURETIC PEPTIDE  TROPONIN I (HIGH SENSITIVITY)    DG Chest Port 1 View  Final Result      Medications  calcium gluconate 1 g/ 50 mL sodium chloride IVPB (1,000 mg Intravenous New Bag/Given  07/03/20 0614)  insulin aspart (novoLOG) injection 5 Units (has no administration in time range)  dextrose 50 % solution 50 mL (has no administration in time range)     Procedures  /  Critical Care .Critical Care Performed by: Maudie Flakes, MD Authorized by: Maudie Flakes, MD   Critical care provider statement:    Critical care time (minutes):  35   Critical care was necessary to treat or prevent imminent or life-threatening deterioration of the following conditions:  Respiratory failure   Critical care was time spent personally by me on the following activities:  Discussions with consultants, evaluation of patient's response to treatment, examination of patient, ordering and performing treatments and interventions, ordering and review of laboratory studies, ordering and review of radiographic studies, pulse oximetry, re-evaluation of patient's condition, obtaining history from patient or surrogate and review of old charts    ED Course and Medical Decision Making  I have reviewed the triage vital signs, the nursing notes, and pertinent available records from the EMR.  Listed above are laboratory and imaging tests that I personally ordered, reviewed, and interpreted and then considered in my medical decision making (see below for details).  Suspect fluid overloaded state in the setting of missed dialysis, also considering underlying viral process such as COVID-19.  Tachycardic, awaiting  EKG and potassium level to determine need for emergent dialysis.     EKG showing some potentially peaked T waves, providing empiric calcium gluconate, awaiting i-STAT Chem-8.  Patient's oxygen requirement is increasing, now on nonrebreather with saturation of 94%.  Says he feels more comfortable on the nonrebreather however.  Respiratory therapy is consulted for high flow nasal cannula.  Patient's oxygen requirement is increasing, now on nonrebreather to maintain saturations in the low 90s.  Chest x-ray with diffuse edema.  Dr. Moshe Cipro of nephrology is consulted, plan is to now admit to the stepdown versus ICU.  Barth Kirks. Sedonia Small, [place] [email or web address]  Final Clinical Impressions(s) / ED Diagnoses     ICD-10-CM   1. Acute respiratory failure with hypoxia (HCC)  J96.01     ED Discharge Orders    None       Discharge Instructions Discussed with and Provided to Patient:   Discharge Instructions   None       Maudie Flakes, MD 07/03/20 [phone]

## 2020-07-03 NOTE — Consult Note (Signed)
[facility] Renal Consultation Note    Indication for Consultation:  Management of ESRD/hemodialysis; anemia, hypertension/volume and secondary hyperparathyroidism  HPI: JOHNAVAN DOEDEN is a 50 y.o. male with a PMH significant for DM, HTN, PTSD, HLD, CHF, noncompliance, PVD s/p RBKA, and ESRD on HD at [facility] every MWF who was brought to [facility] ED via EMS with worsening shortness of breath and chest pain.  In the ED he was noted to have SpO2 of 78% on RA and started on 4 l via [place].CXR with evidence of volume overload and  K of 6.5, BNP 2185.  We were consulted to provide HD for his hyperkalemia and pulmonary edema.  Of note, he chronically skips his Wednesday dialysis despite multiple admissions for pulmonary edema/volume overload and hyperkalemia and presented today with the same issues.  Past Medical History:  Diagnosis Date  . Anemia   . Anxiety   . Blood transfusion without reported diagnosis   . CHF (congestive heart failure) (HCC)    a. EF 25% by echo in 07/2019  . Chronic kidney disease   . Depression   . Diabetes mellitus without complication ([place])   . End stage renal disease ([place])    M/W/F [facility]  . Hyperlipidemia   . Neuropathy   . Peripheral vascular disease ([place])   . PTSD (post-traumatic stress disorder)    Past Surgical History:  Procedure Laterality Date  . A/V FISTULAGRAM N/A 10/09/2018   Procedure: A/V FISTULAGRAM;  Surgeon: Serafina Mitchell, MD;  Location: [place] CV [facility];  Service: Cardiovascular;  Laterality: N/A;  . AV FISTULA PLACEMENT Left 09/22/2016   Procedure: CREATION OF LEFT ARM ARTERIOVENOUS (AV) FISTULA;  Surgeon: Waynetta Sandy, MD;  Location: [place];  Service: Vascular;  Laterality: Left;  . AV FISTULA PLACEMENT Left 10/31/2017   Procedure: INSERTION OF ARTERIOVENOUS (AV) GORE-TEX 4-61m STETCH GRAFT LEFT ARM;  Surgeon: CWaynetta Sandy MD;  [place]  Service: Vascular;  Laterality: Left;  . BASCILIC VEIN  TRANSPOSITION Left 08/17/2017   Procedure: SECOND STAGE BASILIC VEIN TRANSPOSITION LEFT ARM;  Surgeon: CWaynetta Sandy MD;  [place]  Service: Vascular;  Laterality: Left;  . BELOW KNEE LEG AMPUTATION Right   . CARDIOVERSION N/A 02/19/2019   Procedure: CARDIOVERSION;  Surgeon: HPixie Casino MD;  [facility];  Service: Cardiovascular;  Laterality: N/A;  . CHOLECYSTECTOMY    . FOOT SURGERY    . IR FLUORO GUIDE CV LINE RIGHT  05/16/2016  . IR FLUORO GUIDE CV LINE RIGHT  02/16/2019  . IR REMOVAL TUN CV CATH W/O FL  05/16/2016  . IR REMOVAL TUN CV CATH W/O FL  02/19/2019  . IR THROMBECTOMY AV FISTULA W/THROMBOLYSIS/PTA INC/SHUNT/IMG LEFT Left 11/09/2018  . IR THROMBECTOMY AV FISTULA W/THROMBOLYSIS/PTA INC/SHUNT/IMG LEFT Left 06/22/2019  . IR [place] ACC[place] LEFT  11/09/2018  . IR [place] ACCESS LEFT  06/22/2019  . IR [place] VASC ACCESS RIGHT  05/16/2016  . IR [place] VASC ACCESS RIGHT  02/16/2019  . PERIPHERAL VASCULAR BALLOON ANGIOPLASTY  10/09/2018   Procedure: PERIPHERAL VASCULAR BALLOON ANGIOPLASTY;  Surgeon: BSerafina Mitchell MD;  [facility];  Service: Cardiovascular;;  . TEE WITHOUT CARDIOVERSION N/A 02/19/2019   Procedure: TRANSESOPHAGEAL ECHOCARDIOGRAM (TEE);  Surgeon: HPixie Casino MD;  [facility];  Service: Cardiovascular;  Laterality: N/A;   Family History:   Family History  Problem Relation Age of Onset  . Cancer Mother  lung  . Diabetes Mother   . Heart attack Father   . Diabetes Father   . Diabetes Sister    Social History:  reports that he has been smoking. He has never used smokeless tobacco. He reports that he does not drink alcohol and does not use drugs. Allergies  Allergen Reactions  . Tape Other (See Comments)    Pulls skin off  . Other    Prior to Admission medications   Medication Sig Start Date End Date Taking? Authorizing Provider  apixaban (ELIQUIS) 5 MG TABS tablet Take 1 tablet (5 mg  total) by mouth 2 (two) times daily. 08/23/19   Strader, Fransisco Hertz, PA-C  aspirin 81 MG chewable tablet Chew 81 mg by mouth daily.    [provider]  atorvastatin (LIPITOR) 10 MG tablet Take 1 tablet (10 mg total) by mouth every evening. 08/03/19   Barton Dubois, MD  calcium acetate (PHOSLO) 667 MG capsule Take 1 capsule (667 mg total) by mouth 3 (three) times daily with meals. 02/21/19   Donnamae Jude, MD  calcium carbonate (TUMS - DOSED IN MG ELEMENTAL CALCIUM) 500 MG chewable tablet Chew 4 tablets (800 mg of elemental calcium total) by mouth 2 (two) times daily. 08/03/19   Barton Dubois, MD  carvedilol (COREG) 3.125 MG tablet Take 1 tablet (3.125 mg total) by mouth 2 (two) times daily with a meal. 04/09/20 05/09/20  Shahmehdi, Valeria Batman, MD  DULoxetine (CYMBALTA) 30 MG capsule Take 30 mg by mouth 2 (two) times daily. 12/26/19   [provider]  LANTUS SOLOSTAR 100 UNIT/ML Solostar Pen Inject 10 Units into the skin at bedtime.  01/18/19   [provider]  lidocaine-prilocaine (EMLA) cream Apply 1 application topically See admin instructions. Applied three times weekly at dialysis on MWF 05/06/19   [provider]  multivitamin (RENA-VIT) TABS tablet Take 1 tablet by mouth daily. 08/04/18   [provider]  patiromer (VELTASSA) 8.4 g packet Take 1 packet (8.4 g total) by mouth 3 (three) times a week. On Tuesday, Thursday and Saturday 06/10/19   Kathie Dike, MD   Current Facility-Administered Medications  Medication Dose Route Frequency Provider Last Rate Last Admin  . 0.9 %  sodium chloride infusion  250 mL Intravenous PRN Shahmehdi, Seyed A, MD      . acetaminophen (TYLENOL) tablet 650 mg  650 mg Oral Q6H PRN Shahmehdi, Seyed A, MD       Or  . acetaminophen (TYLENOL) suppository 650 mg  650 mg Rectal Q6H PRN Shahmehdi, Seyed A, MD      . apixaban (ELIQUIS) tablet 5 mg  5 mg Oral BID Shahmehdi, Seyed A, MD      . aspirin chewable tablet 81 mg  81 mg Oral  Daily Shahmehdi, Seyed A, MD      . atorvastatin (LIPITOR) tablet 10 mg  10 mg Oral QPM Shahmehdi, Seyed A, MD      . bisacodyl (DULCOLAX) EC tablet 5 mg  5 mg Oral Daily PRN Shahmehdi, Seyed A, MD      . calcium acetate (PHOSLO) capsule 667 mg  667 mg Oral TID WC Shahmehdi, Seyed A, MD      . calcium carbonate (TUMS - dosed in mg elemental calcium) chewable tablet 800 mg of elemental calcium  800 mg of elemental calcium Oral BID Shahmehdi, Seyed A, MD      . calcium gluconate 1 g/ 50 mL sodium chloride IVPB  1 g Intravenous Once Sherwood, Seyed  A, MD      . carvedilol (COREG) tablet 3.125 mg  3.125 mg Oral BID WC Shahmehdi, Seyed A, MD      . Chlorhexidine Gluconate Cloth 2 % PADS 6 each  6 each Topical Q0600 Donato Heinz, MD      . DULoxetine (CYMBALTA) DR capsule 30 mg  30 mg Oral BID Shahmehdi, Seyed A, MD      . hydrALAZINE (APRESOLINE) injection 10 mg  10 mg Intravenous Q4H PRN Shahmehdi, Seyed A, MD      . HYDROmorphone (DILAUDID) injection 0.5-1 mg  0.5-1 mg Intravenous Q2H PRN Shahmehdi, Seyed A, MD      . insulin aspart (novoLOG) injection 0-6 Units  0-6 Units Subcutaneous TID WC Shahmehdi, Seyed A, MD      . insulin glargine (LANTUS) injection 10 Units  10 Units Subcutaneous QHS Shahmehdi, Seyed A, MD      . ipratropium (ATROVENT) nebulizer solution 0.5 mg  0.5 mg Nebulization Q6H PRN Shahmehdi, Seyed A, MD      . levalbuterol (XOPENEX) nebulizer solution 0.63 mg  0.63 mg Nebulization Q6H PRN Shahmehdi, Seyed A, MD      . lidocaine-prilocaine (EMLA) cream 1 application  1 application Topical See admin instructions Shahmehdi, Seyed A, MD      . magnesium citrate solution 1 Bottle  1 Bottle Oral Once PRN Shahmehdi, Seyed A, MD      . multivitamin (RENA-VIT) tablet 1 tablet  1 tablet Oral Daily Shahmehdi, Seyed A, MD      . ondansetron (ZOFRAN) tablet 4 mg  4 mg Oral Q6H PRN Shahmehdi, Seyed A, MD       Or  . ondansetron (ZOFRAN) injection 4 mg  4 mg Intravenous Q6H PRN Shahmehdi,  Seyed A, MD      . oxyCODONE (Oxy IR/ROXICODONE) immediate release tablet 5 mg  5 mg Oral Q4H PRN Shahmehdi, Seyed A, MD      . patiromer Daryll Drown) packet 8.4 g  8.4 g Oral Once per day on Mon Wed Fri Shahmehdi, Seyed A, MD      . senna-docusate (Senokot-S) tablet 1 tablet  1 tablet Oral QHS PRN Shahmehdi, Seyed A, MD      . sodium chloride flush (NS) 0.9 % injection 3 mL  3 mL Intravenous Q12H Shahmehdi, Seyed A, MD      . sodium chloride flush (NS) 0.9 % injection 3 mL  3 mL Intravenous Q12H Shahmehdi, Seyed A, MD      . sodium chloride flush (NS) 0.9 % injection 3 mL  3 mL Intravenous PRN Shahmehdi, Valeria Batman, MD       Current Outpatient Medications  Medication Sig Dispense Refill  . apixaban (ELIQUIS) 5 MG TABS tablet Take 1 tablet (5 mg total) by mouth 2 (two) times daily. 180 tablet 3  . aspirin 81 MG chewable tablet Chew 81 mg by mouth daily.    [name] atorvastatin (LIPITOR) 10 MG tablet Take 1 tablet (10 mg total) by mouth every evening. 90 tablet 2  . calcium acetate (PHOSLO) 667 MG capsule Take 1 capsule (667 mg total) by mouth 3 (three) times daily with meals. 90 capsule 2  . calcium carbonate (TUMS - DOSED IN MG ELEMENTAL CALCIUM) 500 MG chewable tablet Chew 4 tablets (800 mg of elemental calcium total) by mouth 2 (two) times daily. 60 tablet 2  . carvedilol (COREG) 3.125 MG tablet Take 1 tablet (3.125 mg total) by mouth 2 (two) times daily with a meal. 180  tablet 3  . DULoxetine (CYMBALTA) 30 MG capsule Take 30 mg by mouth 2 (two) times daily.    [name] LANTUS SOLOSTAR 100 UNIT/ML Solostar Pen Inject 10 Units into the skin at bedtime.     . lidocaine-prilocaine (EMLA) cream Apply 1 application topically See admin instructions. Applied three times weekly at dialysis on MWF    . multivitamin (RENA-VIT) TABS tablet Take 1 tablet by mouth daily.    . patiromer (VELTASSA) 8.4 g packet Take 1 packet (8.4 g total) by mouth 3 (three) times a week. On Tuesday, Thursday and Saturday 30 each 0    Labs: Basic Metabolic Panel: Recent Labs  Lab 07/03/20 0558 07/03/20 0617  NA 138 138  K 6.0* 6.5*  CL 107 101  CO2  --  23  GLUCOSE 88 84  BUN 55* 62*  CREATININE 12.90* 12.40*  CALCIUM  --  7.5*   Liver Function Tests: Recent Labs  Lab 07/03/20 0617  AST 16  ALT 14  ALKPHOS 77  BILITOT 0.8  PROT 7.1  ALBUMIN 3.7   No results for input(s): LIPASE, AMYLASE in the last 168 hours. No results for input(s): AMMONIA in the last 168 hours. CBC: Recent Labs  Lab 07/03/20 0558 07/03/20 0617  WBC  --  14.9*  HGB 10.2* 10.5*  HCT 30.0* 33.5*  MCV  --  106.7*  PLT  --  252   Cardiac Enzymes: No results for input(s): CKTOTAL, CKMB, CKMBINDEX, TROPONINI in the last 168 hours. CBG: Recent Labs  Lab 07/03/20 0817  GLUCAP 95   Iron Studies: No results for input(s): IRON, TIBC, TRANSFERRIN, FERRITIN in the last 72 hours. Studies/Results: DG Chest Port 1 View  Result Date: 07/03/2020 CLINICAL DATA:  Shortness of breath EXAM: PORTABLE CHEST 1 VIEW COMPARISON:  04/08/2020 FINDINGS: Cardiomegaly. Diffuse interstitial opacity with Kerley lines and vascular pedicle widening. No visible effusion or pneumothorax. IMPRESSION: CHF. Electronically Signed   By: Monte Fantasia M.D.   On: 07/03/2020 06:17    ROS: Pertinent items are noted in HPI. Physical Exam: Vitals:   07/03/20 0729 07/03/20 0730 07/03/20 0800 07/03/20 0815  BP: (!) 183/96 (!) 189/88 (!) 162/83   Pulse: (!) 120 (!) 120 (!) 110   Resp: (!) 31 (!) 29  (!) 25  Temp: 98.4 F (36.9 C)     TempSrc: Oral     SpO2: 93% 99% 100%   Weight:      Height:          Weight change:   Intake/Output Summary (Last 24 hours) at 07/03/2020 [id number] Last data filed at 07/03/2020 0720 Gross per 24 hour  Intake 44.7 ml  Output --  Net 44.7 ml   BP (!) 162/83   Pulse (!) 110   Temp 98.4 F (36.9 C) (Oral)   Resp (!) 25   Ht 6' (1.829 m)   Wt 99.8 kg   SpO2 100%   BMI 29.84 kg/m  General appearance: alert,  cooperative, appears older than stated age, no distress and wearing oxygen Head: Normocephalic, without obvious abnormality, atraumatic Resp: diminished breath sounds bilaterally Cardio: regular rate and rhythm and no rub GI: soft, non-tender; bowel sounds normal; no masses,  no organomegaly Extremities: s/p RBKA, no edema, LUE AVF +T/B Dialysis Access: LUE AVG  [facility] 98 kg 1K/2.25 bath Hep 1000+ 500u/hr LUE AVG 300/500  - prosthetic weighs 1.6 kg - Epogen 1000Units IV/HD  Assessment/Plan: 1.  Hyperkalemia - due to noncompliance  with HD.  Plan for HD with 1 K bath and stressed the importance of getting 3 HD treatments per week 2.  ESRD -  Plan for HD today 3.  Hypertension/volume  - UF as tolerated, wean oxygen as able. 4.  Anemia  - stable 5.  Metabolic bone disease -   Continue with home meds 6.  Nutrition -  Renal diet 7. DIsposition - hopeful discharge to home after HD today.  F/u with normal HD session on Monday.  Donetta Potts, MD [place] Pager [phone] 07/03/2020, 9:26 AM

## 2020-07-03 NOTE — H&P (Signed)
History and Physical   Patient: Brendan Campbell                            PCP: Monico Blitz, MD                    DOB: 1970/12/22            DOA: 07/03/2020 [id number]             DOS: 07/03/2020, 7:32 AM  Patient coming from:   HOME  I have personally reviewed patient's medical records, in electronic medical records, including:  [place] link, and care everywhere.    Chief Complaint:   Chief Complaint  Patient presents with  . Shortness of Breath    History of present illness:    Brendan Campbell is a 50 y.o. 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 fib, chronic anemia, hyperlipidemia, diabetes mellitus type II, presenting with shortness of breath. Patient missed his hemodialysis since last Wednesday.  He has not had a dialysis for the past 5 days. Reported progressively getting worse with shortness of breath, has become hypoxic on room air.  Also reporting some nonradiating chest tightness.  Patient Denies having: Fever, Chills, Cough, Abd pain, N/V/D, headache, dizziness, lightheadedness,  Dysuria, Joint pain, rash, open wounds  ED Course:   Vitals: Abnormal labs;     Review of Systems: As per HPI, otherwise 10 point review of systems were negative.   ----------------------------------------------------------------------------------------------------------------------  Allergies  Allergen Reactions  . Tape Other (See Comments)    Pulls skin off  . Other     Home MEDs:  Prior to Admission medications   Medication Sig Start Date End Date Taking? Authorizing Provider  apixaban (ELIQUIS) 5 MG TABS tablet Take 1 tablet (5 mg total) by mouth 2 (two) times daily. 08/23/19   Strader, Fransisco Hertz, PA-C  aspirin 81 MG chewable tablet Chew 81 mg by mouth daily.    [provider]  atorvastatin (LIPITOR) 10 MG tablet Take 1 tablet (10 mg  total) by mouth every evening. 08/03/19   Barton Dubois, MD  calcium acetate (PHOSLO) 667 MG capsule Take 1 capsule (667 mg total) by mouth 3 (three) times daily with meals. 02/21/19   Donnamae Jude, MD  calcium carbonate (TUMS - DOSED IN MG ELEMENTAL CALCIUM) 500 MG chewable tablet Chew 4 tablets (800 mg of elemental calcium total) by mouth 2 (two) times daily. 08/03/19   Barton Dubois, MD  carvedilol (COREG) 3.125 MG tablet Take 1 tablet (3.125 mg total) by mouth 2 (two) times daily with a meal. 04/09/20 05/09/20  Marrion Finan, Valeria Batman, MD  DULoxetine (CYMBALTA) 30 MG capsule Take 30 mg by mouth 2 (two) times daily. 12/26/19   [provider]  LANTUS SOLOSTAR 100 UNIT/ML Solostar Pen Inject 10 Units into the skin at bedtime.  01/18/19   [provider]  lidocaine-prilocaine (EMLA) cream Apply 1 application topically See admin instructions. Applied three times weekly at dialysis on MWF 05/06/19   [provider]  multivitamin (RENA-VIT) TABS tablet Take 1 tablet by mouth daily. 08/04/18   [provider]  patiromer (VELTASSA) 8.4 g packet Take 1 packet (8.4 g total) by mouth 3 (three) times a week. On Tuesday, Thursday and Saturday 06/10/19   Kathie Dike, MD    PRN MEDs: sodium chloride,  acetaminophen **OR** acetaminophen, bisacodyl, hydrALAZINE, HYDROmorphone (DILAUDID) injection, ipratropium, levalbuterol, magnesium citrate, ondansetron **OR** ondansetron (ZOFRAN) IV, oxyCODONE, senna-docusate, sodium chloride flush  Past Medical History:  Diagnosis Date  . Anemia   . Anxiety   . Blood transfusion without reported diagnosis   . CHF (congestive heart failure) (HCC)    a. EF 25% by echo in 07/2019  . Chronic kidney disease   . Depression   . Diabetes mellitus without complication ([place])   . End stage renal disease ([place])    M/W/F [facility]  . Hyperlipidemia   . Neuropathy   . Peripheral vascular disease ([place])   . PTSD (post-traumatic stress disorder)      Past Surgical History:  Procedure Laterality Date  . A/V FISTULAGRAM N/A 10/09/2018   Procedure: A/V FISTULAGRAM;  Surgeon: Serafina Mitchell, MD;  Location: [place] [facility];  Service: Cardiovascular;  Laterality: N/A;  . AV FISTULA PLACEMENT Left 09/22/2016   Procedure: CREATION OF LEFT ARM ARTERIOVENOUS (AV) FISTULA;  Surgeon: Waynetta Sandy, MD;  Location: [facility];  Service: Vascular;  Laterality: Left;  . AV FISTULA PLACEMENT Left 10/31/2017   Procedure: INSERTION OF ARTERIOVENOUS (AV) GORE-TEX 4-8m STETCH GRAFT LEFT ARM;  Surgeon: CWaynetta Sandy MD;  [place] [facility]  Service: Vascular;  Laterality: Left;  . BASCILIC VEIN TRANSPOSITION Left 08/17/2017   Procedure: SECOND STAGE BASILIC VEIN TRANSPOSITION LEFT ARM;  Surgeon: CWaynetta Sandy MD;  [facility]  Service: Vascular;  Laterality: Left;  . BELOW KNEE LEG AMPUTATION Right   . CARDIOVERSION N/A 02/19/2019   Procedure: CARDIOVERSION;  Surgeon: HPixie Casino MD;  [facility];  Service: Cardiovascular;  Laterality: N/A;  . CHOLECYSTECTOMY    . FOOT SURGERY    . IR FLUORO GUIDE CV LINE RIGHT  05/16/2016  . IR FLUORO GUIDE CV LINE RIGHT  02/16/2019  . IR REMOVAL TUN CV CATH W/O FL  05/16/2016  . IR REMOVAL TUN CV CATH W/O FL  02/19/2019  . IR THROMBECTOMY AV FISTULA W/THROMBOLYSIS/PTA INC/SHUNT/IMG LEFT Left 11/09/2018  . IR THROMBECTOMY AV FISTULA W/THROMBOLYSIS/PTA INC/SHUNT/IMG LEFT Left 06/22/2019  . IR [place] ACCESS LEFT  11/09/2018  . IR [place] ACCESS LEFT  06/22/2019  . IR [place] VASC ACCESS RIGHT  05/16/2016  . IR [place] VASC ACCESS RIGHT  02/16/2019  . PERIPHERAL VASCULAR BALLOON ANGIOPLASTY  10/09/2018   Procedure: PERIPHERAL VASCULAR BALLOON ANGIOPLASTY;  Surgeon: BSerafina Mitchell MD;  [facility];  Service: Cardiovascular;;  . TEE WITHOUT CARDIOVERSION N/A 02/19/2019   Procedure: TRANSESOPHAGEAL ECHOCARDIOGRAM (TEE);  Surgeon: HPixie Casino MD;   [facility];  Service: Cardiovascular;  Laterality: N/A;     reports that he has been smoking. He has never used smokeless tobacco. He reports that he does not drink alcohol and does not use drugs.   Family History  Problem Relation Age of Onset  . Cancer Mother        lung  . Diabetes Mother   . Heart attack Father   . Diabetes Father   . Diabetes Sister     Physical Exam:   Vitals:   07/03/20 0519 07/03/20 0555 07/03/20 0600 07/03/20 0729  BP:   (!) 174/100 (!) 183/96  Pulse:   (!) 114 (!) 120  Resp:   (!) 24 (!) 31  Temp:    98.4 F (36.9 C)  TempSrc:    Oral  SpO2:  92% 92% 93%  Weight: 99.8 kg     Height: 6' (1.829 m)      Constitutional: Moderate acute distress, with shortness of breath, on 15 L of oxygen via nonrebreather mask Eyes: PERRL, lids and conjunctivae normal ENMT: Mucous membranes are moist. Posterior pharynx clear of any exudate or lesions.Normal dentition.  Neck: normal, supple, no masses, no thyromegaly Respiratory: clear to auscultation bilaterally, no wheezing, no crackles. Normal respiratory effort. No accessory muscle use.  Cardiovascular: Regular rate and rhythm, no murmurs / rubs / gallops. No extremity edema. 2+ pedal pulses. No carotid bruits.  Abdomen: no tenderness, no masses palpated. No hepatosplenomegaly. Bowel sounds positive.  Musculoskeletal: Right BKA, no clubbing / cyanosis. No joint deformity upper and lower extremities. Good ROM, no contractures. Normal muscle tone.  Neurologic: CN II-XII grossly intact. Sensation intact, DTR normal. Strength 5/5 in all 4.  Psychiatric: Normal judgment and insight. Alert and oriented x 3. Normal mood.  Skin: no rashes, lesions, ulcers. No induration, left arm AV fistula Wounds: per nursing documentation         Labs on admission:    I have personally reviewed following labs and imaging studies  CBC: Recent Labs  Lab 07/03/20 0558 07/03/20 0617  WBC  --  14.9*  HGB 10.2*  10.5*  HCT 30.0* 33.5*  MCV  --  106.7*  PLT  --  [id number]   Basic Metabolic Panel: Recent Labs  Lab 07/03/20 0558 07/03/20 0617  NA 138 138  K 6.0* 6.5*  CL 107 101  CO2  --  23  GLUCOSE 88 84  BUN 55* 62*  CREATININE 12.90* 12.40*  CALCIUM  --  7.5*   GFR: Estimated Creatinine Clearance: 8.8 mL/min (A) (by C-G formula based on SCr of 12.4 mg/dL (H)). Liver Function Tests: Recent Labs  Lab 07/03/20 0617  AST 16  ALT 14  ALKPHOS 77  BILITOT 0.8  PROT 7.1  ALBUMIN 3.7   No results for input(s): LIPASE, AMYLASE in the last 168 hours. No results for input(s): AMMONIA in the last 168 hours. Coagulation Profile: No results for input(s): INR, PROTIME in the last 168 hours. Cardiac Enzymes: No results for input(s): CKTOTAL, CKMB, CKMBINDEX, TROPONINI in the last 168 hours. BNP (last 3 results) No results for input(s): PROBNP in the last 8760 hours. HbA1C: No results for input(s): HGBA1C in the last 72 hours. CBG: No results for input(s): GLUCAP in the last 168 hours. Lipid Profile: No results for input(s): CHOL, HDL, LDLCALC, TRIG, CHOLHDL, LDLDIRECT in the last 72 hours. Thyroid Function Tests: No results for input(s): TSH, T4TOTAL, FREET4, T3FREE, THYROIDAB in the last 72 hours. Anemia Panel: No results for input(s): VITAMINB12, FOLATE, FERRITIN, TIBC, IRON, RETICCTPCT in the last 72 hours. Urine analysis: No results found for: COLORURINE, APPEARANCEUR, LABSPEC, [place], GLUCOSEU, HGBUR, BILIRUBINUR, KETONESUR, PROTEINUR, UROBILINOGEN, NITRITE, LEUKOCYTESUR   Radiologic Exams on Admission:   DG Chest Port 1 View  Result Date: 07/03/2020 CLINICAL DATA:  Shortness of breath EXAM: PORTABLE CHEST 1 VIEW COMPARISON:  04/08/2020 FINDINGS: Cardiomegaly. Diffuse interstitial opacity with Kerley lines and vascular pedicle widening. No visible effusion or pneumothorax. IMPRESSION: CHF. Electronically Signed   By: Monte Fantasia M.D.   On: 07/03/2020 06:17    EKG:    Independently reviewed.  Orders placed or performed during the hospital encounter of 07/03/20  . EKG 12-Lead  . EKG 12-Lead  . EKG 12-Lead  . EKG 12-Lead  . EKG 12-Lead  . EKG 12-Lead  . EKG 12-Lead   ---------------------------------------------------------------------------------------------------------------------------------------  Assessment / Plan:   Principal Problem:   Acute respiratory failure (HCC) Active Problems:   Hyperkalemia   Acute pulmonary edema (HCC)   ESRD on hemodialysis ([place])   Type 2 diabetes mellitus with diabetic chronic kidney disease ([place])   Hyperlipidemia   S/P below knee amputation, right (HCC)   Anemia due to end stage renal disease (HCC)   Chronic atrial fibrillation (HCC)   Hyperparathyroidism (HCC)   Acute on chronic combined systolic and diastolic CHF (congestive heart failure) (HCC)   Principal Problem:   Acute respiratory failure (HCC) -shortness of breath, hypoxia -Due to volume overload, from missed hemodialysis -Currently awake on 15 L of nonrebreather will mask satting 92% -We will continue supplemental oxygen, DuoNeb bronchodilator treatment -Obviously patient needs immediate hemodialysis  End-stage renal disease -volume overload -On hemodialysis Mondays/Wednesdays/Fridays -Apparently patient has missed hemodialysis since last Wednesday -no HD for 5 days -Presenting with volume overload needing immediate hemodialysis -Nephrologist has been notified pending immediate hemodialysis today  Hyperkalemia -Potassium 6.5 -Patient presenting with some mild chest tightness,,EKG reviewed, repeat EKG pending -Patient has received calcium gluconate, bicarb IV in ED -Were monitoring closely, pending hemodialysis  Acute pulmonary edema (HCC)  -Volume overload -due to missed hemodialysis, on 15 L of oxygen -will need HD   Active Problems:  ESRD on hemodialysis ([place]) -left AV fistula, HD M/W/F -plan as above  DM II w  CKD/neuropathy -resuming home medication including Lantus, checking CBG QA CHS, with SSI coverage... Last A1c 5.3   Hyperlipidemia -continue statins  S/P below knee amputation, right (HCC) -stable no signs of open wound  Anemia due to end stage renal disease ([place]) -monitoring transfuse as needed with HD  Chronic atrial fibrillation (HCC) -stable continue home medication of Coreg and Eliquis  Hyperparathyroidism ([place]) -monitoring will rectify home meds    Acute on chronic combined systolic and diastolic CHF (congestive heart failure) ([place])  -We will monitor closely, I's and O's, patient is on hemodialysis, daily weight, -Ejection fraction 25% last echo 07/28/2019  Depression -continue home medication of Cymbalta     Cultures:  -none  Antimicrobial: -none  Consults called: Nephrologist (Dr. Moshe Cipro) --------------------------------------------------------------------------------------------------------------------------------------- DVT prophylaxis: SCD/home medication of Eliquis Code Status:   Code Status: Full Code   Admission status: Patient will be admitted as Observation, with a less than 2 midnight length of stay. Level of care: Telemetry   Family Communication:  none at bedside  (The above findings and plan of care has been discussed with patient in detail, the patient expressed understanding and agreement of above plan)  --------------------------------------------------------------------------------------------------------------------------------------------------  Disposition Plan:  Anticipated 1-2 days Status is: Observation  The patient remains OBS appropriate and will d/c before 2 midnights.  Dispo: The patient is from: Home              Anticipated d/c is to: Home              Patient currently is not medically stable to d/c.  In respiratory failure requiring 15 L of oxyge               hypoxic, missed hemodialysis needs intervention   Difficult to place  patient No         ----------------------------------------------------------------------------------------------------------------------------------------------------  Time spent: > than  55  Min.   SIGNED: Deatra Donevan, MD, FHM. [facility],  Pager (Please use [email or web address] to page to text)  If 7PM-7AM, please contact night-coverage [email or web address],  07/03/2020, 7:32 AM

## 2020-07-03 NOTE — Plan of Care (Signed)
  Problem: Acute Rehab PT Goals(only PT should resolve) Goal: Pt Will Go Supine/Side To Sit Outcome: Progressing Flowsheets (Taken 07/03/2020 1124) Pt will go Supine/Side to Sit:  with modified independence  Independently Goal: Patient Will Transfer Sit To/From Stand Outcome: Progressing Flowsheets (Taken 07/03/2020 1124) Patient will transfer sit to/from stand: with modified independence Goal: Pt Will Transfer Bed To Chair/Chair To Bed Outcome: Progressing Flowsheets (Taken 07/03/2020 1124) Pt will Transfer Bed to Chair/Chair to Bed: with modified independence Goal: Pt Will Ambulate Outcome: Progressing Flowsheets (Taken 07/03/2020 1124) Pt will Ambulate:  75 feet  with modified independence  with supervision  with cane   11:25 AM, 07/03/20 Lonell Grandchild, MPT Physical Therapist with [facility] [phone] office [phone] mobile phone

## 2020-07-03 NOTE — ED Notes (Addendum)
Report called to 300, rn Larena Glassman, pt currently with RN Levada Dy being dialyzed then go to 3rd floor, dialysis rn updated

## 2020-07-03 NOTE — ED Provider Notes (Signed)
7:35 AM-nurse reported to me that the patient is currently complaining of chest discomfort and low back pain.  Low back pain is apparently ongoing and chronic.  EKG was ordered which shows rate marginally faster than prior, otherwise no acute changes.  Currently saturating 93% on facemask oxygen.  Dialysis pending.  He has been treated for hyperkalemia with medications.  We will give low-dose narcotic analgesia, fentanyl 50 mcg IV.    Daleen Bo, MD 07/03/20 [phone]

## 2020-07-03 NOTE — Evaluation (Signed)
Occupational Therapy Evaluation Patient Details Name: Brendan Campbell MRN: [id number] DOB: 1970-06-01 Today's Date: 07/03/2020    History of Present Illness Brendan Campbell is a 50 y.o. 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 fib, chronic anemia, hyperlipidemia, diabetes mellitus type II, presenting with shortness of breath.  Patient missed his hemodialysis since last Wednesday.  He has not had a dialysis for the past 5 days.  Reported progressively getting worse with shortness of breath, has become hypoxic on room air.  Also reporting some nonradiating chest tightness.   Clinical Impression   Pt agreeable to OT evaluation but reportedly fatigued and completed only sit to stand due to reportedly being tired from ambulating with PT earlier. Pt demonstrates bed mobility with HOB elevated with Mod I level of assist. Mod I to SPV for sit to stand from EOB using R BKA prosthetic leg. Pt demonstrates weakness in UE and genal deficits in endurance. Pt donning 3 LPM O2 during evaluation. Evaluation limited to bedside per pt's report of fatigue. Pt will benefit from continued OT in the hospital and recommended venue below to increase strength, balance, and endurance for safe ADL's.     Follow Up Recommendations  Home health OT;Supervision - Intermittent    Equipment Recommendations  None recommended by OT           Precautions / Restrictions Precautions Precautions: Fall Required Braces or Orthoses: Other Brace Other Brace: R BKA prosthetic leg Restrictions Weight Bearing Restrictions: No      Mobility Bed Mobility Overal bed mobility: Modified Independent             General bed mobility comments: HOB raised, increased time, slightly labored movement    Transfers Overall transfer level: Needs assistance Equipment used: Straight cane;Lofstrands Transfers: Sit  to/from Stand Sit to Stand: Modified independent (Device/Increase time);Supervision Stand pivot transfers: Supervision       General transfer comment: mild increased time, labored movement; pt reporting fatigue from walking with PT prior to OT evaluation.    Balance Overall balance assessment: Needs assistance Sitting-balance support: Feet supported;No upper extremity supported Sitting balance-Leahy Scale: Good Sitting balance - Comments: seated at EOB   Standing balance support: No upper extremity supported;During functional activity Standing balance-Leahy Scale: Fair Standing balance comment: sit to stand from EOB.                           ADL either performed or assessed with clinical judgement   ADL Overall ADL's : Needs assistance/impaired                         Toilet Transfer: Supervision/safety;Modified Independent (use of cane) Toilet Transfer Details (indicate cue type and reason): Per clinical judgement based on level of assist needed for sit to stand.                 Vision Baseline Vision/History: Cataracts (R eye)                  Pertinent Vitals/Pain Pain Assessment: 0-10 Pain Score: 7  Pain Location: low back Pain Descriptors / Indicators: Stabbing Pain Intervention(s): Limited activity within patient's tolerance;Monitored during session;Premedicated before session     Hand Dominance Right   Extremity/Trunk Assessment Upper Extremity Assessment Upper Extremity Assessment: Generalized weakness (3+/5 B shoulder flexion and abduction; 4+/5 B elbow  extension; 4/5 B elbow flexion; 4/5 B grip)   Lower Extremity Assessment Lower Extremity Assessment: Defer to PT evaluation   Cervical / Trunk Assessment Cervical / Trunk Assessment: Normal   Communication Communication Communication: No difficulties   Cognition Arousal/Alertness: Awake/alert Behavior During Therapy: WFL for tasks assessed/performed Overall Cognitive  Status: Within Functional Limits for tasks assessed                                                      Home Living Family/patient expects to be discharged to:: Private residence Living Arrangements: Non-relatives/Friends Available Help at Discharge: Friend(s);Available 24 hours/day Type of Home: House Home Access: Ramped entrance     Home Layout: One level     Bathroom Shower/Tub: Teacher, early years/pre: Handicapped height Bathroom Accessibility: Yes   Home Equipment: [place] - single point;Wheelchair - manual;Shower seat          Prior Functioning/Environment Level of Independence: Independent with assistive device(s)        Comments: Community ambulator using tri-pod cane and RLE BKA prostethic leg        OT Problem List: Decreased strength;Decreased activity tolerance;Impaired balance (sitting and/or standing);Pain      OT Treatment/Interventions: Self-care/ADL training;Therapeutic exercise;Therapeutic activities;Balance training;Patient/family education;Energy conservation    OT Goals(Current goals can be found in the care plan section) Acute Rehab OT Goals Patient Stated Goal: return home with friends to assist OT Goal Formulation: With patient Time For Goal Achievement: 07/17/20 Potential to Achieve Goals: Good  OT Frequency: Min 2X/week    AM-PAC OT "6 Clicks" Daily Activity     Outcome Measure Help from another person eating meals?: None Help from another person taking care of personal grooming?: None Help from another person toileting, which includes using toliet, bedpan, or urinal?: A Little Help from another person bathing (including washing, rinsing, drying)?: A Little Help from another person to put on and taking off regular upper body clothing?: None Help from another person to put on and taking off regular lower body clothing?: None 6 Click Score: 22   End of Session Equipment Utilized During Treatment: Oxygen (3  LPM O2)  Activity Tolerance: Patient tolerated treatment well;Other (comment) Patient left: in bed;with call bell/phone within reach;with nursing/sitter in room  OT Visit Diagnosis: Unsteadiness on feet (R26.81);Other abnormalities of gait and mobility (R26.89);Muscle weakness (generalized) (M62.81)                Time: [id number] OT Time Calculation (min): 14 min Charges:  OT General Charges $OT Visit: 1 Visit OT Evaluation $OT Eval Low Complexity: 1 Low  Moorea Boissonneault OT, MOT   Larey Seat 07/03/2020, 11:46 AM

## 2020-07-03 NOTE — Plan of Care (Signed)
  Problem: Acute Rehab OT Goals (only OT should resolve) Goal: Pt. Will Perform Grooming Flowsheets (Taken 07/03/2020 1150) Pt Will Perform Grooming:  standing  with modified independence Goal: Pt. Will Perform Lower Body Dressing Flowsheets (Taken 07/03/2020 1150) Pt Will Perform Lower Body Dressing:  with modified independence  sit to/from stand Goal: Pt. Will Transfer To Toilet Flowsheets (Taken 07/03/2020 1150) Pt Will Transfer to Toilet:  with modified independence  ambulating  stand pivot transfer Goal: Pt/Caregiver Will Perform Home Exercise Program Flowsheets (Taken 07/03/2020 1150) Pt/caregiver will Perform Home Exercise Program:  Increased strength  Both right and left upper extremity  Independently  Zyonna Vardaman OT, MOT

## 2020-07-03 NOTE — Procedures (Signed)
   HEMODIALYSIS TREATMENT NOTE:  3.5 hour heparin-free HD completed via LUE AVG.  Difficult cannulation (two failed attempts by this RN) and one failed attempt by Linton Flemings, RN before two 15g needles were placed.  This is not a new problem.    Goal met: 2.2 liters removed.  All blood was returned and hemostasis was achieved in 10 minutes.  No changes from pre-HD assessment.  Bedside report given to Reed Breech, RN  Rockwell Alexandria, RN

## 2020-07-03 NOTE — Progress Notes (Signed)
RT called to place patient on BIPAP. Upon arrival to room patient stated he did not like the BIPAP and did not want to wear it at this time. Explained to patient that if his condition worsened he would have to go on machine. Attempted HFNC but patient also refused that. Patient currently on NRB mask. RN and MD made aware.

## 2020-07-03 NOTE — ED Triage Notes (Signed)
Pt brought in by [facility] for chest pain and sob. Pt 78% on room air. Placed on 4L of O2 with improving results. Pt reports symptoms started around midnight. Pt had dialysis last on Monday, pt missed Wednesday, and is supposed to have this morning '@11'$ :30 am. Pt able to speak full sentences.

## 2020-07-03 NOTE — ED Notes (Signed)
Respiratory called, requested to bedside, pt currently 92% on 15L NRB

## 2020-07-04 DIAGNOSIS — N186 End stage renal disease: Secondary | ICD-10-CM | POA: Diagnosis not present

## 2020-07-04 DIAGNOSIS — I5043 Acute on chronic combined systolic (congestive) and diastolic (congestive) heart failure: Secondary | ICD-10-CM | POA: Diagnosis not present

## 2020-07-04 DIAGNOSIS — J810 Acute pulmonary edema: Secondary | ICD-10-CM | POA: Diagnosis not present

## 2020-07-04 DIAGNOSIS — J9601 Acute respiratory failure with hypoxia: Secondary | ICD-10-CM | POA: Diagnosis not present

## 2020-07-04 LAB — CBC
HCT: 29.5 % — ABNORMAL LOW (ref 39.0–52.0)
Hemoglobin: 9.2 g/dL — ABNORMAL LOW (ref 13.0–17.0)
MCH: 33.2 pg (ref 26.0–34.0)
MCHC: 31.2 g/dL (ref 30.0–36.0)
MCV: 106.5 fL — ABNORMAL HIGH (ref 80.0–100.0)
Platelets: 213 10*3/uL (ref 150–400)
RBC: 2.77 MIL/uL — ABNORMAL LOW (ref 4.22–5.81)
RDW: 12.7 % (ref 11.5–15.5)
WBC: 9.6 10*3/uL (ref 4.0–10.5)
nRBC: 0 % (ref 0.0–0.2)

## 2020-07-04 LAB — GLUCOSE, CAPILLARY
Glucose-Capillary: 71 mg/dL (ref 70–99)
Glucose-Capillary: 80 mg/dL (ref 70–99)
Glucose-Capillary: 81 mg/dL (ref 70–99)
Glucose-Capillary: 96 mg/dL (ref 70–99)
Glucose-Capillary: 67 mg/dL — ABNORMAL LOW (ref 70–99)
Glucose-Capillary: 76 mg/dL (ref 70–99)

## 2020-07-04 LAB — HEMOGLOBIN A1C
Hgb A1c MFr Bld: 5.1 % (ref 4.8–5.6)
Mean Plasma Glucose: 100 mg/dL

## 2020-07-04 LAB — BASIC METABOLIC PANEL
Anion gap: 9 (ref 5–15)
BUN: 33 mg/dL — ABNORMAL HIGH (ref 6–20)
CO2: 32 mmol/L (ref 22–32)
Calcium: 8.3 mg/dL — ABNORMAL LOW (ref 8.9–10.3)
Chloride: 95 mmol/L — ABNORMAL LOW (ref 98–111)
Creatinine, Ser: 7.83 mg/dL — ABNORMAL HIGH (ref 0.61–1.24)
GFR, Estimated: 8 mL/min — ABNORMAL LOW (ref >60)
Glucose, Bld: 81 mg/dL (ref 70–99)
Potassium: 4.7 mmol/L (ref 3.5–5.1)
Sodium: 136 mmol/L (ref 135–145)

## 2020-07-04 LAB — PROTIME-INR
INR: 1.2 (ref 0.8–1.2)
Prothrombin Time: 15.5 seconds — ABNORMAL HIGH (ref 11.4–15.2)

## 2020-07-04 MED ORDER — OXYCODONE HCL 5 MG PO TABS: 5.0000 mg | ORAL_TABLET | ORAL | 0 refills | Status: AC | PRN

## 2020-07-04 NOTE — Progress Notes (Signed)
Nsg Discharge Note  Admit Date:  07/03/2020 Discharge date: 07/04/2020   JAE PETCH to be D/C'd Home  per MD order.  AVS completed.  Copy for chart, and copy for patient signed, and dated. Patient/caregiver able to verbalize understanding. IV removed. Discharge paper work given and reviewed with patient. Home oxygen sent with patient.   Discharge Medication: Allergies as of 07/04/2020      Reactions   Tape Other (See Comments)   Pulls skin off   Other       Medication List    TAKE these medications   apixaban 5 MG Tabs tablet Commonly known as: ELIQUIS Take 1 tablet (5 mg total) by mouth 2 (two) times daily.   aspirin 81 MG chewable tablet Chew 81 mg by mouth daily.   atorvastatin 10 MG tablet Commonly known as: LIPITOR Take 1 tablet (10 mg total) by mouth every evening.   calcium acetate 667 MG capsule Commonly known as: PHOSLO Take 1 capsule (667 mg total) by mouth 3 (three) times daily with meals.   calcium carbonate 500 MG chewable tablet Commonly known as: TUMS - dosed in mg elemental calcium Chew 4 tablets (800 mg of elemental calcium total) by mouth 2 (two) times daily.   carvedilol 3.125 MG tablet Commonly known as: Coreg Take 1 tablet (3.125 mg total) by mouth 2 (two) times daily with a meal.   DULoxetine 30 MG capsule Commonly known as: CYMBALTA Take 30 mg by mouth 2 (two) times daily.   Lantus SoloStar 100 UNIT/ML Solostar Pen Generic drug: insulin glargine Inject 10 Units into the skin at bedtime.   lidocaine-prilocaine cream Commonly known as: EMLA Apply 1 application topically See admin instructions. Applied three times weekly at dialysis on MWF   multivitamin Tabs tablet Take 1 tablet by mouth daily.   oxyCODONE 5 MG immediate release tablet Commonly known as: Oxy IR/ROXICODONE Take 1 tablet (5 mg total) by mouth every 4 (four) hours as needed for up to 3 days for moderate pain.   Veltassa 8.4 g packet Generic drug: patiromer Take 1  packet (8.4 g total) by mouth 3 (three) times a week. On Tuesday, Thursday and Saturday            Durable Medical Equipment  (From admission, onward)         Start     Ordered   07/04/20 1332  For home use only DME oxygen  Once       Question Answer Comment  Length of Need 12 Months   Liters per Minute 2   Oxygen delivery system Gas      07/04/20 1332          Discharge Assessment: Vitals:   07/04/20 0823 07/04/20 1438  BP: 118/69 136/81  Pulse: 79 79  Resp:  20  Temp:  98.1 F (36.7 C)  SpO2:  99%   Skin clean, dry and intact without evidence of skin break down, no evidence of skin tears noted. IV catheter discontinued intact. Site without signs and symptoms of complications - no redness or edema noted at insertion site, patient denies c/o pain - only slight tenderness at site.  Dressing with slight pressure applied.  D/c Instructions-Education: Discharge instructions given to patient/family with verbalized understanding. D/c education completed with patient/family including follow up instructions, medication list, d/c activities limitations if indicated, with other d/c instructions as indicated by MD - patient able to verbalize understanding, all questions fully answered. Patient instructed to return to  ED, call 911, or call MD for any changes in condition.  Patient escorted via [place], and D/C home via private auto.  Zenaida Deed, RN 07/04/2020 4:50 PM

## 2020-07-04 NOTE — Progress Notes (Signed)
SATURATION QUALIFICATIONS: (This note is used to comply with regulatory documentation for home oxygen)  Patient Saturations on Room Air at Rest = 87%  Patient Saturations on Room Air while Ambulating = %  Patient Saturations on 2 Liters of oxygen while Ambulating = 95%  Please briefly explain why patient needs home oxygen:

## 2020-07-04 NOTE — Progress Notes (Signed)
Hypoglycemic Event  CBG: 67  Treatment: 4 oz juice/soda  Symptoms: Hungry  Follow-up CBG: [id number] CBG Result:76  Possible Reasons for Event: Inadequate meal intake  Comments/MD notified:    Zenaida Deed

## 2020-07-04 NOTE — Progress Notes (Signed)
Patient awaiting for ride. Discharge paper work given and reviewed with patient. IV removed.

## 2020-07-04 NOTE — TOC Transition Note (Signed)
Transition of Care [facility]) - CM/SW Discharge Note   Patient Details  Name: Brendan Campbell MRN: [id number] Date of Birth: [birth year]  Transition of [facility]) CM/SW Contact:  Natasha Bence, LCSW Phone Number: 07/04/2020, 6:40 PM   Clinical Narrative:    CSW notified of patient's readiness for discharge and O2 requirements. CSW placed order with Ashly of [employer] for O2. [employer] agreeable to provide O2. CSW also referred patient for OPPT. TOC signing off.    Final next level of care: Home/Self Care Barriers to Discharge: Barriers Resolved   Patient Goals and CMS Choice Patient states their goals for this hospitalization and ongoing recovery are:: Rehab with OPPT CMS Medicare.gov Compare Post Acute Care list provided to:: Patient Choice offered to / list presented to : Patient  Discharge Placement                    Patient and family notified of of transfer: 07/04/20  Discharge Plan and Services                DME Arranged: Oxygen DME Agency: [facility]                  Social Determinants of Health (SDOH) Interventions     Readmission Risk Interventions No flowsheet data found.

## 2020-07-04 NOTE — Discharge Summary (Addendum)
Physician Discharge Summary [facility]    Patient: Brendan Campbell                   Admit date: 07/03/2020   DOB: 1970-07-28             Discharge date:07/04/2020/2:47 PM [id number]                          PCP: Monico Blitz, MD  Disposition: HOME  Recommendations for Outpatient Follow-up:   . Follow up: in 2 days with nephrologist and resume hemodialysis . Neck schedule hemodialysis this Mondays  Discharge Condition: Stable   Code Status:   Code Status: Full Code  Diet recommendation: Diabetic diet   Discharge Diagnoses:    Principal Problem:   Acute respiratory failure ([place]) Active Problems:   Hyperkalemia   Acute pulmonary edema ([place])   ESRD on hemodialysis ([place])   Type 2 diabetes mellitus with diabetic chronic kidney disease ([place])   Hyperlipidemia   S/P below knee amputation, right ([place])   Anemia due to end stage renal disease ([place])   Chronic atrial fibrillation ([place])   Hyperparathyroidism ([place])   Acute on chronic combined systolic and diastolic CHF (congestive heart failure) ([place])   History of Present Illness/ Hospital Course Kathleen Argue Summary:   Brendan Campbell is a 50 y.o. 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 fib, chronic anemia, hyperlipidemia, diabetes mellitus type II, presenting with shortness of breath. Patient missed his hemodialysis since last Wednesday.  He has not had a dialysis for the past 5 days. Reported progressively getting worse with shortness of breath, has become hypoxic on room air.  Also reporting some nonradiating chest tightness.  Patient Denies having: Fever, Chills, Cough, Abd pain, N/V/D, headache, dizziness, lightheadedness,  Dysuria, Joint pain, rash, open wounds   Principal Problem:    Acute respiratory failure (HCC) -shortness of breath, hypoxia -Resolved post hemodialysis, and overall volume  status improved  End-stage renal disease -volume overload -On hemodialysis Mondays/Wednesdays/Fridays -Apparently patient has missed hemodialysis since last Wednesday -no HD for 5 days -Presenting with volume overload needing immediate hemodialysis -Nephrologist has been notified pending immediate hemodialysis  -Patient was successfully hemodialyzed, patient is return to euvolemic state  Hyperkalemia -Potassium 6.5 >>> improved with hemodialysis 4.7 this a.m. -Patient presenting with some mild chest tightness,,EKG reviewed, repeat EKG pending -Patient has received calcium gluconate, bicarb IV in ED -  Acute pulmonary edema (HCC)  -Volume overload -Improved from 15L to 2.5 L currently satting 100%   Active Problems:  ESRD on hemodialysis ([place]) -left AV fistula, HD M/W/F -plan as above  DM II w CKD/neuropathy -resuming home medication including Lantus, checking CBG QA CHS, with SSI coverage... Last A1c 5.3   Hyperlipidemia -continue statins  S/P below knee amputation, right (HCC) -stable no signs of open wound  Anemia due to end stage renal disease ([place]) -monitoring transfuse as needed with HD  Chronic atrial fibrillation ([place]) -stable continue home medication of Coreg and Eliquis  Hyperparathyroidism ([place]) -monitoring will rectify home meds    Acute on chronic combined systolic and diastolic CHF (congestive heart failure) ([place])  -We will monitor closely, I's and O's, patient is on hemodialysis, daily weight, -Ejection fraction 25% last echo 07/28/2019  Depression -continue home medication of Cymbalta   Cultures:  -none  Antimicrobial: -none  Consults called: Nephrologist (Dr. Moshe Cipro)   ---------------------------------------------------------------------------------------------------------------------------------------  Code Status:   Code Status: Full Code   Family Communication:  none at bedside  (The above findings and plan of care has  been discussed with patient in detail, the patient expressed understanding and agreement of above plan)  --------------------------------------------------------------------------------------------------------------------------------------s.  Dispo: The patient is from: Home  Anticipated d/c is to: Home     Discharge Instructions:   Discharge Instructions    Activity as tolerated - No restrictions   Complete by: As directed    Call MD for:  difficulty breathing, headache or visual disturbances   Complete by: As directed    Call MD for:  persistant dizziness or light-headedness   Complete by: As directed    Call MD for:  persistant nausea and vomiting   Complete by: As directed    Call MD for:  redness, tenderness, or signs of infection (pain, swelling, redness, odor or green/yellow discharge around incision site)   Complete by: As directed    Call MD for:  temperature >100.4   Complete by: As directed    Diet - low sodium heart healthy   Complete by: As directed    Discharge instructions   Complete by: As directed    Follow-up with your nephrologist, and scheduled hemodialysis Your next hemodialysis scheduled for Monday morning   Increase activity slowly   Complete by: As directed        Medication List    TAKE these medications   apixaban 5 MG Tabs tablet Commonly known as: ELIQUIS Take 1 tablet (5 mg total) by mouth 2 (two) times daily.   aspirin 81 MG chewable tablet Chew 81 mg by mouth daily.   atorvastatin 10 MG tablet Commonly known as: LIPITOR Take 1 tablet (10 mg total) by mouth every evening.   calcium acetate 667 MG capsule Commonly known as: PHOSLO Take 1 capsule (667 mg total) by mouth 3 (three) times daily with meals.   calcium carbonate 500 MG chewable tablet Commonly known as: TUMS - dosed in mg elemental calcium Chew 4 tablets (800 mg of elemental calcium total) by mouth 2 (two) times daily.   carvedilol 3.125 MG tablet Commonly  known as: Coreg Take 1 tablet (3.125 mg total) by mouth 2 (two) times daily with a meal.   DULoxetine 30 MG capsule Commonly known as: CYMBALTA Take 30 mg by mouth 2 (two) times daily.   Lantus SoloStar 100 UNIT/ML Solostar Pen Generic drug: insulin glargine Inject 10 Units into the skin at bedtime.   lidocaine-prilocaine cream Commonly known as: EMLA Apply 1 application topically See admin instructions. Applied three times weekly at dialysis on MWF   multivitamin Tabs tablet Take 1 tablet by mouth daily.   oxyCODONE 5 MG immediate release tablet Commonly known as: Oxy IR/ROXICODONE Take 1 tablet (5 mg total) by mouth every 4 (four) hours as needed for up to 3 days for moderate pain.   Veltassa 8.4 g packet Generic drug: patiromer Take 1 packet (8.4 g total) by mouth 3 (three) times a week. On Tuesday, Thursday and Saturday       Allergies  Allergen Reactions  . Tape Other (See Comments)    Pulls skin off  . Other      Procedures /Studies:   DG Chest Port 1 View  Result Date: 07/03/2020 CLINICAL DATA:  Shortness of breath EXAM: PORTABLE CHEST 1 VIEW COMPARISON:  04/08/2020 FINDINGS: Cardiomegaly. Diffuse interstitial opacity with Kerley lines and vascular pedicle widening. No visible effusion or pneumothorax. IMPRESSION: CHF. Electronically Signed  By: Monte Fantasia M.D.   On: 07/03/2020 06:17    Subjective:   Patient was seen and examined 07/04/2020, 2:47 PM Patient stable today. No acute distress.  No issues overnight Stable for discharge.  Discharge Exam:    Vitals:   07/04/20 0601 07/04/20 0642 07/04/20 0823 07/04/20 1438  BP: (!) 149/87  118/69 136/81  Pulse: 81  79 79  Resp: 18   20  Temp: 98.3 F (36.8 C)   98.1 F (36.7 C)  TempSrc: Oral   Oral  SpO2: 100%   99%  Weight:  101.8 kg    Height:        General: Pt lying comfortably in bed & appears in no obvious distress. Cardiovascular: S1 & S2 heard, RRR, S1/S2 +. No murmurs, rubs, gallops  or clicks. No JVD or pedal edema. Respiratory: Clear to auscultation without wheezing, rhonchi or crackles. No increased work of breathing. Abdominal:  Non-distended, non-tender & soft. No organomegaly or masses appreciated. Normal bowel sounds heard. CNS: Alert and oriented. No focal deficits. Extremities: no edema, no cyanosis    The results of significant diagnostics from this hospitalization (including imaging, microbiology, ancillary and laboratory) are listed below for reference.      Microbiology:   Recent Results (from the past 240 hour(s))  Resp Panel by RT-PCR (Flu A&B, Covid) Nasopharyngeal Swab     Status: None   Collection Time: 07/03/20  5:42 AM   Specimen: Nasopharyngeal Swab; Nasopharyngeal(NP) swabs in vial transport medium  Result Value Ref Range Status   SARS Coronavirus 2 by RT PCR NEGATIVE NEGATIVE Final    Comment: (NOTE) SARS-CoV-2 target nucleic acids are NOT DETECTED.  The SARS-CoV-2 RNA is generally detectable in upper respiratory specimens during the acute phase of infection. The lowest concentration of SARS-CoV-2 viral copies this assay can detect is 138 copies/mL. A negative result does not preclude SARS-Cov-2 infection and should not be used as the sole basis for treatment or other patient management decisions. A negative result may occur with  improper specimen collection/handling, submission of specimen other than nasopharyngeal swab, presence of viral mutation(s) within the areas targeted by this assay, and inadequate number of viral copies(<138 copies/mL). A negative result must be combined with clinical observations, patient history, and epidemiological information. The expected result is Negative.  Fact Sheet for Patients:  EntrepreneurPulse.com.au  Fact Sheet for Healthcare Providers:  IncredibleEmployment.be  This test is no t yet approved or cleared by the [place] FDA and  has been authorized for  detection and/or diagnosis of SARS-CoV-2 by FDA under an Emergency Use Authorization (EUA). This EUA will remain  in effect (meaning this test can be used) for the duration of the COVID-19 declaration under Section 564(b)(1) of the Act, 21 U.S.C.section 360bbb-3(b)(1), unless the authorization is terminated  or revoked sooner.       Influenza A by PCR NEGATIVE NEGATIVE Final   Influenza B by PCR NEGATIVE NEGATIVE Final    Comment: (NOTE) The Xpert Xpress SARS-CoV-2/FLU/RSV plus assay is intended as an aid in the diagnosis of influenza from Nasopharyngeal swab specimens and should not be used as a sole basis for treatment. Nasal washings and aspirates are unacceptable for Xpert Xpress SARS-CoV-2/FLU/RSV testing.  Fact Sheet for Patients: EntrepreneurPulse.com.au  Fact Sheet for Healthcare Providers: IncredibleEmployment.be  This test is not yet approved or cleared by the [place] FDA and has been authorized for detection and/or diagnosis of SARS-CoV-2 by FDA under an Emergency Use Authorization (EUA). This EUA  will remain in effect (meaning this test can be used) for the duration of the COVID-19 declaration under Section 564(b)(1) of the Act, 21 U.S.C. section 360bbb-3(b)(1), unless the authorization is terminated or revoked.  Performed at [facility], [address], [place], [place] [address]      Labs:   CBC: Recent Labs  Lab 07/03/20 0558 07/03/20 0617 07/04/20 0600  WBC  --  14.9* 9.6  HGB 10.2* 10.5* 9.2*  HCT 30.0* 33.5* 29.5*  MCV  --  106.7* 106.5*  PLT  --  252 [id number]   Basic Metabolic Panel: Recent Labs  Lab 07/03/20 0558 07/03/20 0617 07/04/20 0600  NA 138 138 136  K 6.0* 6.5* 4.7  CL 107 101 95*  CO2  --  23 32  GLUCOSE 88 84 81  BUN 55* 62* 33*  CREATININE 12.90* 12.40* 7.83*  CALCIUM  --  7.5* 8.3*   Liver Function Tests: Recent Labs  Lab 07/03/20 0617  AST 16  ALT 14  ALKPHOS 77  BILITOT 0.8   PROT 7.1  ALBUMIN 3.7   BNP (last 3 results) Recent Labs    12/13/19 1322 04/08/20 0145 07/03/20 0618  BNP 1,867.0* 1,971.0* 2,185.0*   Cardiac Enzymes: No results for input(s): CKTOTAL, CKMB, CKMBINDEX, TROPONINI in the last 168 hours. CBG: Recent Labs  Lab 07/03/20 1023 07/03/20 1653 07/03/20 2350 07/04/20 0722 07/04/20 1116  GLUCAP 90 81 96 71 80   Hgb A1c Recent Labs    07/03/20 0617  HGBA1C 5.1   Lipid Profile No results for input(s): CHOL, HDL, LDLCALC, TRIG, CHOLHDL, LDLDIRECT in the last 72 hours. Thyroid function studies No results for input(s): TSH, T4TOTAL, T3FREE, THYROIDAB in the last 72 hours.  Invalid input(s): FREET3 Anemia work up No results for input(s): VITAMINB12, FOLATE, FERRITIN, TIBC, IRON, RETICCTPCT in the last 72 hours. Urinalysis No results found for: COLORURINE, APPEARANCEUR, LABSPEC, [place], GLUCOSEU, HGBUR, BILIRUBINUR, KETONESUR, PROTEINUR, UROBILINOGEN, NITRITE, LEUKOCYTESUR       Time coordinating discharge: Over 45 minutes  SIGNED: Deatra Omarrion, MD, FACP, FHM. [facility],  Please use [email or web address] to Page If 7PM-7AM, please contact night-coverage [email or web address] [facility] 07/04/2020, 2:47 PM

## 2020-07-20 ENCOUNTER — Other Ambulatory Visit: Payer: Self-pay

## 2020-07-20 ENCOUNTER — Emergency Department (HOSPITAL_COMMUNITY): Payer: Medicaid Other

## 2020-07-20 ENCOUNTER — Inpatient Hospital Stay (HOSPITAL_COMMUNITY)
Admission: EM | Admit: 2020-07-20 | Discharge: 2020-07-21 | DRG: 177 | Disposition: A | Discharge status: Home / Self Care | Payer: Medicaid Other | Attending: Internal Medicine | Admitting: Internal Medicine

## 2020-07-20 ENCOUNTER — Encounter (HOSPITAL_COMMUNITY): Payer: Self-pay

## 2020-07-20 DIAGNOSIS — N186 End stage renal disease: Secondary | ICD-10-CM | POA: Diagnosis present

## 2020-07-20 DIAGNOSIS — J1282 Pneumonia due to coronavirus disease 2019: Secondary | ICD-10-CM | POA: Diagnosis present

## 2020-07-20 DIAGNOSIS — E1142 Type 2 diabetes mellitus with diabetic polyneuropathy: Secondary | ICD-10-CM | POA: Diagnosis present

## 2020-07-20 DIAGNOSIS — Z9115 Patient's noncompliance with renal dialysis: Secondary | ICD-10-CM

## 2020-07-20 DIAGNOSIS — E1122 Type 2 diabetes mellitus with diabetic chronic kidney disease: Secondary | ICD-10-CM | POA: Diagnosis present

## 2020-07-20 DIAGNOSIS — I482 Chronic atrial fibrillation, unspecified: Secondary | ICD-10-CM | POA: Diagnosis present

## 2020-07-20 DIAGNOSIS — Z992 Dependence on renal dialysis: Secondary | ICD-10-CM

## 2020-07-20 DIAGNOSIS — T380X5A Adverse effect of glucocorticoids and synthetic analogues, initial encounter: Secondary | ICD-10-CM | POA: Diagnosis not present

## 2020-07-20 DIAGNOSIS — F419 Anxiety disorder, unspecified: Secondary | ICD-10-CM | POA: Diagnosis present

## 2020-07-20 DIAGNOSIS — Z833 Family history of diabetes mellitus: Secondary | ICD-10-CM

## 2020-07-20 DIAGNOSIS — I132 Hypertensive heart and chronic kidney disease with heart failure and with stage 5 chronic kidney disease, or end stage renal disease: Secondary | ICD-10-CM | POA: Diagnosis present

## 2020-07-20 DIAGNOSIS — F17210 Nicotine dependence, cigarettes, uncomplicated: Secondary | ICD-10-CM | POA: Diagnosis present

## 2020-07-20 DIAGNOSIS — F4310 Post-traumatic stress disorder, unspecified: Secondary | ICD-10-CM | POA: Diagnosis present

## 2020-07-20 DIAGNOSIS — N2581 Secondary hyperparathyroidism of renal origin: Secondary | ICD-10-CM | POA: Diagnosis present

## 2020-07-20 DIAGNOSIS — Z79899 Other long term (current) drug therapy: Secondary | ICD-10-CM | POA: Diagnosis not present

## 2020-07-20 DIAGNOSIS — Z7901 Long term (current) use of anticoagulants: Secondary | ICD-10-CM | POA: Diagnosis not present

## 2020-07-20 DIAGNOSIS — G4440 Drug-induced headache, not elsewhere classified, not intractable: Secondary | ICD-10-CM | POA: Diagnosis not present

## 2020-07-20 DIAGNOSIS — Z7982 Long term (current) use of aspirin: Secondary | ICD-10-CM | POA: Diagnosis not present

## 2020-07-20 DIAGNOSIS — R0902 Hypoxemia: Secondary | ICD-10-CM | POA: Diagnosis present

## 2020-07-20 DIAGNOSIS — J9621 Acute and chronic respiratory failure with hypoxia: Secondary | ICD-10-CM | POA: Diagnosis present

## 2020-07-20 DIAGNOSIS — F32A Depression, unspecified: Secondary | ICD-10-CM | POA: Diagnosis present

## 2020-07-20 DIAGNOSIS — R0600 Dyspnea, unspecified: Secondary | ICD-10-CM

## 2020-07-20 DIAGNOSIS — E8770 Fluid overload, unspecified: Secondary | ICD-10-CM | POA: Diagnosis not present

## 2020-07-20 DIAGNOSIS — D631 Anemia in chronic kidney disease: Secondary | ICD-10-CM | POA: Diagnosis present

## 2020-07-20 DIAGNOSIS — E8889 Other specified metabolic disorders: Secondary | ICD-10-CM | POA: Diagnosis present

## 2020-07-20 DIAGNOSIS — I5043 Acute on chronic combined systolic (congestive) and diastolic (congestive) heart failure: Secondary | ICD-10-CM | POA: Diagnosis present

## 2020-07-20 DIAGNOSIS — E785 Hyperlipidemia, unspecified: Secondary | ICD-10-CM | POA: Diagnosis present

## 2020-07-20 DIAGNOSIS — Z8249 Family history of ischemic heart disease and other diseases of the circulatory system: Secondary | ICD-10-CM

## 2020-07-20 DIAGNOSIS — U071 COVID-19: Principal | ICD-10-CM | POA: Diagnosis present

## 2020-07-20 DIAGNOSIS — E1151 Type 2 diabetes mellitus with diabetic peripheral angiopathy without gangrene: Secondary | ICD-10-CM | POA: Diagnosis present

## 2020-07-20 DIAGNOSIS — E875 Hyperkalemia: Secondary | ICD-10-CM | POA: Diagnosis present

## 2020-07-20 DIAGNOSIS — Z794 Long term (current) use of insulin: Secondary | ICD-10-CM

## 2020-07-20 LAB — COMPREHENSIVE METABOLIC PANEL
ALT: 11 U/L (ref 0–44)
AST: 15 U/L (ref 15–41)
Albumin: 3.9 g/dL (ref 3.5–5.0)
Alkaline Phosphatase: 69 U/L (ref 38–126)
Anion gap: 12 (ref 5–15)
BUN: 45 mg/dL — ABNORMAL HIGH (ref 6–20)
CO2: 26 mmol/L (ref 22–32)
Calcium: 8.3 mg/dL — ABNORMAL LOW (ref 8.9–10.3)
Chloride: 99 mmol/L (ref 98–111)
Creatinine, Ser: 10.63 mg/dL — ABNORMAL HIGH (ref 0.61–1.24)
GFR, Estimated: 5 mL/min — ABNORMAL LOW (ref >60)
Glucose, Bld: 103 mg/dL — ABNORMAL HIGH (ref 70–99)
Potassium: 6.1 mmol/L — ABNORMAL HIGH (ref 3.5–5.1)
Sodium: 137 mmol/L (ref 135–145)
Total Bilirubin: 0.7 mg/dL (ref 0.3–1.2)
Total Protein: 7.5 g/dL (ref 6.5–8.1)

## 2020-07-20 LAB — FERRITIN: Ferritin: 1303 ng/mL — ABNORMAL HIGH (ref 24–336)

## 2020-07-20 LAB — GLUCOSE, CAPILLARY
Glucose-Capillary: 106 mg/dL — ABNORMAL HIGH (ref 70–99)
Glucose-Capillary: 135 mg/dL — ABNORMAL HIGH (ref 70–99)

## 2020-07-20 LAB — CBC WITH DIFFERENTIAL/PLATELET
Abs Immature Granulocytes: 0.06 10*3/uL (ref 0.00–0.07)
Basophils Absolute: 0.1 10*3/uL (ref 0.0–0.1)
Basophils Relative: 1 %
Eosinophils Absolute: 0.5 10*3/uL (ref 0.0–0.5)
Eosinophils Relative: 5 %
HCT: 32.7 % — ABNORMAL LOW (ref 39.0–52.0)
Hemoglobin: 10.4 g/dL — ABNORMAL LOW (ref 13.0–17.0)
Immature Granulocytes: 1 %
Lymphocytes Relative: 6 %
Lymphs Abs: 0.6 10*3/uL — ABNORMAL LOW (ref 0.7–4.0)
MCH: 33.8 pg (ref 26.0–34.0)
MCHC: 31.8 g/dL (ref 30.0–36.0)
MCV: 106.2 fL — ABNORMAL HIGH (ref 80.0–100.0)
Monocytes Absolute: 0.7 10*3/uL (ref 0.1–1.0)
Monocytes Relative: 7 %
Neutro Abs: 8.4 10*3/uL — ABNORMAL HIGH (ref 1.7–7.7)
Neutrophils Relative %: 80 %
Platelets: 261 10*3/uL (ref 150–400)
RBC: 3.08 MIL/uL — ABNORMAL LOW (ref 4.22–5.81)
RDW: 13.2 % (ref 11.5–15.5)
WBC: 10.2 10*3/uL (ref 4.0–10.5)
nRBC: 0 % (ref 0.0–0.2)

## 2020-07-20 LAB — D-DIMER, QUANTITATIVE: D-Dimer, Quant: 0.83 ug/mL-FEU — ABNORMAL HIGH (ref 0.00–0.50)

## 2020-07-20 LAB — C-REACTIVE PROTEIN: CRP: 0.8 mg/dL (ref <1.0)

## 2020-07-20 LAB — TROPONIN I (HIGH SENSITIVITY)
Troponin I (High Sensitivity): 38 ng/L — ABNORMAL HIGH (ref <18)
Troponin I (High Sensitivity): 46 ng/L — ABNORMAL HIGH (ref <18)

## 2020-07-20 LAB — CBG MONITORING, ED: Glucose-Capillary: 104 mg/dL — ABNORMAL HIGH (ref 70–99)

## 2020-07-20 LAB — PROCALCITONIN: Procalcitonin: 0.24 ng/mL

## 2020-07-20 LAB — RESP PANEL BY RT-PCR (FLU A&B, COVID) ARPGX2
Influenza A by PCR: NEGATIVE
Influenza B by PCR: NEGATIVE
SARS Coronavirus 2 by RT PCR: POSITIVE — AB

## 2020-07-20 LAB — BRAIN NATRIURETIC PEPTIDE: B Natriuretic Peptide: 1637 pg/mL — ABNORMAL HIGH (ref 0.0–100.0)

## 2020-07-20 IMAGING — DX DG CHEST 1V PORT
1 series · 1 of 1 positions shown · non-contrast
Comparison: [DATE]

CLINICAL DATA: Chest pain, shortness of breath

EXAM:
PORTABLE CHEST 1 VIEW

[chest ap]
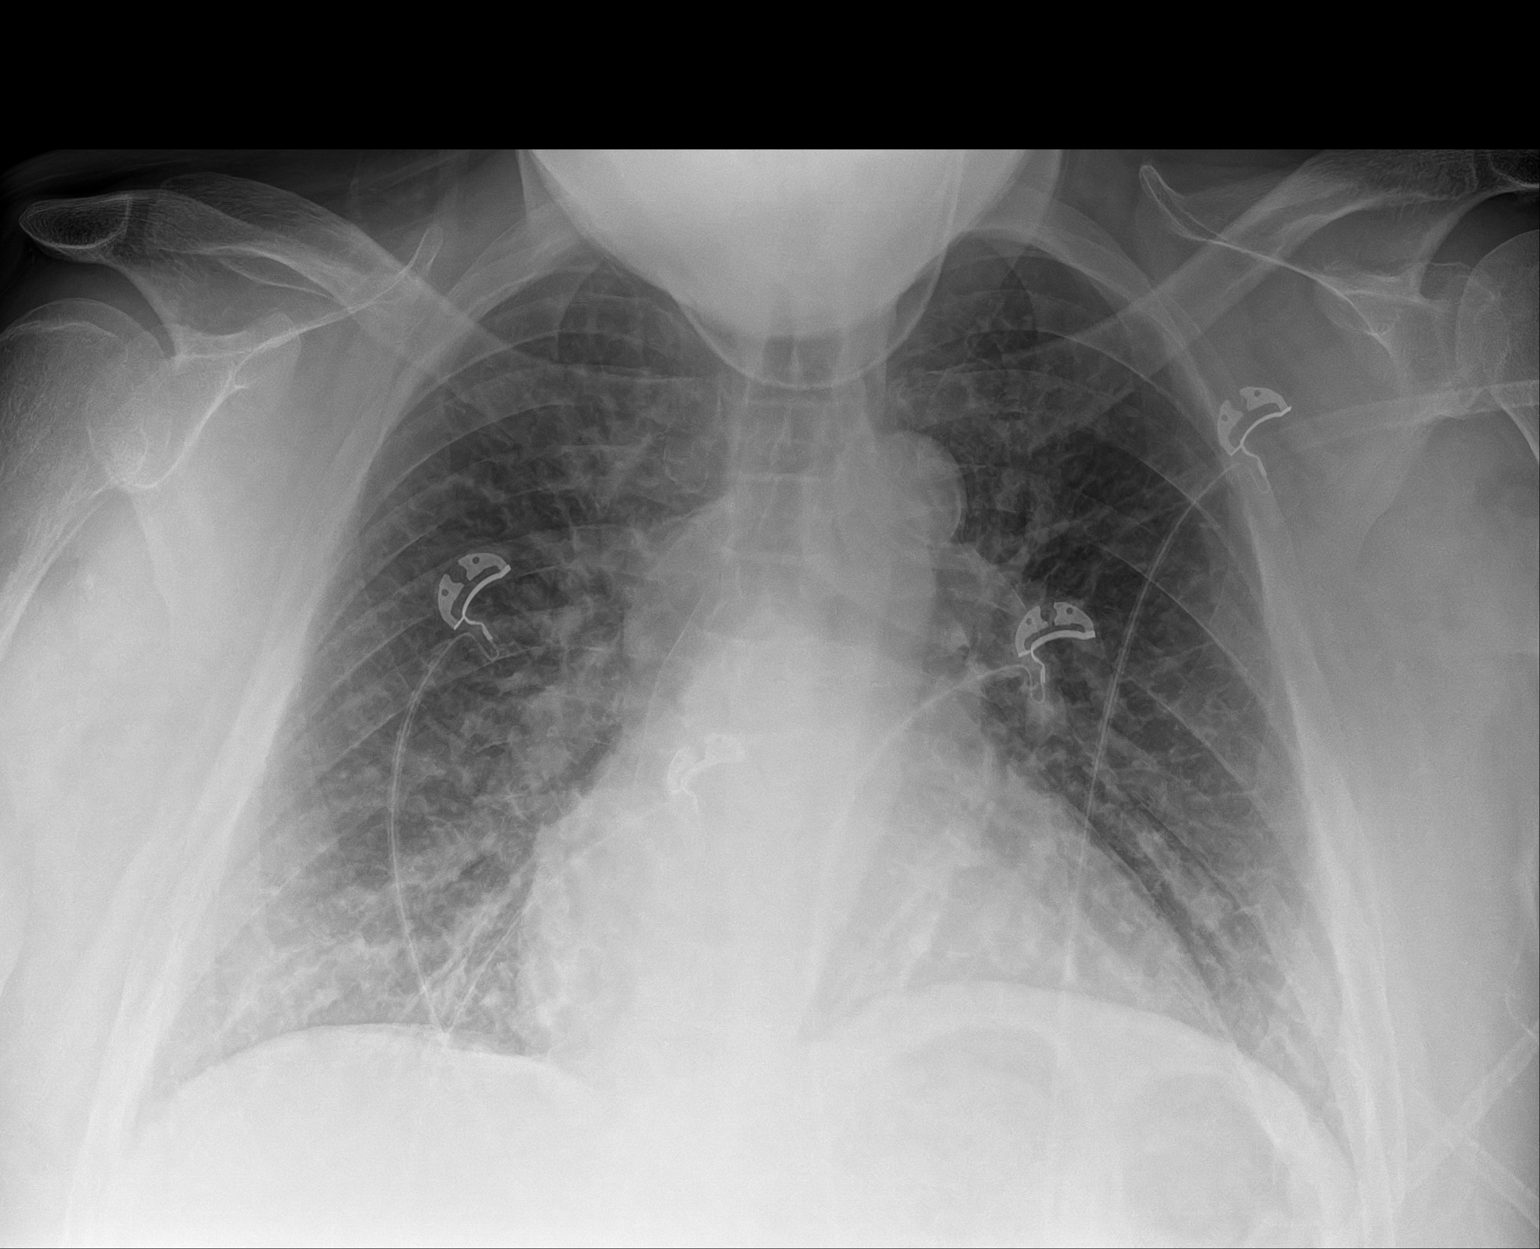

[1 of 1 positions shown; findings below may reference images not displayed]

FINDINGS: Heart is borderline in size. No confluent airspace opacities,
effusions or edema. No acute bony abnormality.
IMPRESSION: No active disease.

## 2020-07-20 MED ORDER — FUROSEMIDE 10 MG/ML IJ SOLN
80.0000 mg | Freq: Once | INTRAMUSCULAR | Status: AC
  Administered 2020-07-20: 80 mg via INTRAVENOUS
  Filled 2020-07-20: qty 8

## 2020-07-20 MED ORDER — LINAGLIPTIN 5 MG PO TABS
5.0000 mg | ORAL_TABLET | Freq: Every day | ORAL | Status: DC
  Administered 2020-07-21: 5 mg via ORAL
  Filled 2020-07-20: qty 1

## 2020-07-20 MED ORDER — CALCIUM ACETATE (PHOS BINDER) 667 MG PO CAPS
667.0000 mg | ORAL_CAPSULE | Freq: Three times a day (TID) | ORAL | Status: DC
  Administered 2020-07-20 – 2020-07-21 (×3): 667 mg via ORAL
  Filled 2020-07-20 (×3): qty 1

## 2020-07-20 MED ORDER — INSULIN DETEMIR 100 UNIT/ML ~~LOC~~ SOLN
0.0750 [IU]/kg | Freq: Two times a day (BID) | SUBCUTANEOUS | Status: DC
  Administered 2020-07-20 – 2020-07-21 (×2): 8 [IU] via SUBCUTANEOUS
  Filled 2020-07-20 (×5): qty 0.08

## 2020-07-20 MED ORDER — ACETAMINOPHEN 650 MG RE SUPP: 650.0000 mg | Freq: Four times a day (QID) | RECTAL | Status: DC | PRN

## 2020-07-20 MED ORDER — SODIUM CHLORIDE 0.9 % IV SOLN
100.0000 mg | Freq: Every day | INTRAVENOUS | Status: DC
  Administered 2020-07-21: 100 mg via INTRAVENOUS
  Filled 2020-07-20 (×2): qty 20

## 2020-07-20 MED ORDER — CHLORHEXIDINE GLUCONATE CLOTH 2 % EX PADS: 6.0000 | MEDICATED_PAD | Freq: Every day | CUTANEOUS | Status: DC

## 2020-07-20 MED ORDER — GABAPENTIN 300 MG PO CAPS
300.0000 mg | ORAL_CAPSULE | Freq: Once | ORAL | Status: AC
  Administered 2020-07-20: 300 mg via ORAL
  Filled 2020-07-20: qty 1

## 2020-07-20 MED ORDER — ASPIRIN 81 MG PO CHEW
81.0000 mg | CHEWABLE_TABLET | Freq: Every day | ORAL | Status: DC
  Administered 2020-07-21: 81 mg via ORAL
  Filled 2020-07-20: qty 1

## 2020-07-20 MED ORDER — SODIUM ZIRCONIUM CYCLOSILICATE 5 G PO PACK
10.0000 g | PACK | Freq: Once | ORAL | Status: AC
  Administered 2020-07-20: 10 g via ORAL
  Filled 2020-07-20: qty 2

## 2020-07-20 MED ORDER — CALCIUM GLUCONATE-NACL 1-0.675 GM/50ML-% IV SOLN
1.0000 g | Freq: Once | INTRAVENOUS | Status: AC
  Administered 2020-07-20: 1000 mg via INTRAVENOUS
  Filled 2020-07-20: qty 50

## 2020-07-20 MED ORDER — HEPARIN SODIUM (PORCINE) 1000 UNIT/ML DIALYSIS: 2500.0000 [IU] | INTRAMUSCULAR | Status: DC | PRN

## 2020-07-20 MED ORDER — ONDANSETRON HCL 4 MG/2ML IJ SOLN
4.0000 mg | Freq: Four times a day (QID) | INTRAMUSCULAR | Status: DC | PRN
  Administered 2020-07-20: 4 mg via INTRAVENOUS
  Filled 2020-07-20: qty 2

## 2020-07-20 MED ORDER — ATORVASTATIN CALCIUM 10 MG PO TABS: 10.0000 mg | ORAL_TABLET | Freq: Every evening | ORAL | Status: DC

## 2020-07-20 MED ORDER — DEXAMETHASONE SODIUM PHOSPHATE 10 MG/ML IJ SOLN
6.0000 mg | INTRAMUSCULAR | Status: DC
  Administered 2020-07-20 – 2020-07-21 (×2): 6 mg via INTRAVENOUS
  Filled 2020-07-20 (×2): qty 1

## 2020-07-20 MED ORDER — ACETAMINOPHEN 325 MG PO TABS
650.0000 mg | ORAL_TABLET | Freq: Four times a day (QID) | ORAL | Status: DC | PRN
  Administered 2020-07-20: 650 mg via ORAL
  Filled 2020-07-20: qty 2

## 2020-07-20 MED ORDER — RENA-VITE PO TABS
1.0000 | ORAL_TABLET | Freq: Every day | ORAL | Status: DC
  Administered 2020-07-21: 1 via ORAL
  Filled 2020-07-20: qty 1

## 2020-07-20 MED ORDER — CALCIUM CARBONATE ANTACID 500 MG PO CHEW
800.0000 mg | CHEWABLE_TABLET | Freq: Two times a day (BID) | ORAL | Status: DC
  Administered 2020-07-20: 800 mg via ORAL
  Filled 2020-07-20: qty 4

## 2020-07-20 MED ORDER — DULOXETINE HCL 30 MG PO CPEP
30.0000 mg | ORAL_CAPSULE | Freq: Two times a day (BID) | ORAL | Status: DC
  Administered 2020-07-20 – 2020-07-21 (×2): 30 mg via ORAL
  Filled 2020-07-20 (×2): qty 1

## 2020-07-20 MED ORDER — ALBUTEROL SULFATE HFA 108 (90 BASE) MCG/ACT IN AERS: 2.0000 | INHALATION_SPRAY | RESPIRATORY_TRACT | Status: DC | PRN

## 2020-07-20 MED ORDER — KETOROLAC TROMETHAMINE 30 MG/ML IJ SOLN
30.0000 mg | Freq: Once | INTRAMUSCULAR | Status: AC
  Administered 2020-07-20: 30 mg via INTRAVENOUS
  Filled 2020-07-20: qty 1

## 2020-07-20 MED ORDER — NITROGLYCERIN 2 % TD OINT
1.0000 [in_us] | TOPICAL_OINTMENT | Freq: Four times a day (QID) | TRANSDERMAL | Status: AC
  Administered 2020-07-20: 1 [in_us] via TOPICAL
  Filled 2020-07-20: qty 1

## 2020-07-20 MED ORDER — SODIUM CHLORIDE 0.9 % IV SOLN: 100.0000 mg | Freq: Every day | INTRAVENOUS | Status: DC

## 2020-07-20 MED ORDER — SODIUM CHLORIDE 0.9 % IV SOLN: 200.0000 mg | Freq: Once | INTRAVENOUS | Status: DC

## 2020-07-20 MED ORDER — INSULIN ASPART 100 UNIT/ML IJ SOLN: 0.0000 [IU] | Freq: Three times a day (TID) | INTRAMUSCULAR | Status: DC

## 2020-07-20 MED ORDER — APIXABAN 5 MG PO TABS
5.0000 mg | ORAL_TABLET | Freq: Two times a day (BID) | ORAL | Status: DC
  Administered 2020-07-20 – 2020-07-21 (×2): 5 mg via ORAL
  Filled 2020-07-20 (×2): qty 1

## 2020-07-20 MED ORDER — BUTALBITAL-APAP-CAFFEINE 50-325-40 MG PO TABS
1.0000 | ORAL_TABLET | Freq: Four times a day (QID) | ORAL | Status: DC | PRN
  Administered 2020-07-20: 1 via ORAL
  Filled 2020-07-20: qty 1

## 2020-07-20 MED ORDER — SODIUM CHLORIDE 0.9 % IV SOLN
100.0000 mg | INTRAVENOUS | Status: AC
  Administered 2020-07-20 (×2): 100 mg via INTRAVENOUS
  Filled 2020-07-20 (×2): qty 20

## 2020-07-20 MED ORDER — ATORVASTATIN CALCIUM 10 MG PO TABS
10.0000 mg | ORAL_TABLET | Freq: Every day | ORAL | Status: DC
  Administered 2020-07-20: 10 mg via ORAL
  Filled 2020-07-20: qty 1

## 2020-07-20 NOTE — H&P (Addendum)
History and Physical    Brendan Campbell [id number] DOB: 1970/06/29 DOA: 07/20/2020  PCP: Monico Blitz, MD   Patient coming from: Home.  I have personally briefly reviewed patient's old medical records in [place]  Chief Complaint: Shortness of breath.  HPI: Brendan Campbell is a 50 y.o. male with medical history significant of ESRD anemia, anxiety, depression, PTSD, chronic systolic CHF, type II DM, hyperlipidemia, diabetic peripheral neuropathy, PVD who is coming to the emergency department with complaints of progressively worse dyspnea since yesterday evening associated with chest pressure, diaphoresis, LLE edema and orthopnea.  He denied fever, chills, sore throat or rhinorrhea.  He has been nauseous, but denies abdominal pain, diarrhea, constipation, melena and hematochezia.  He urinates almost daily and denies dysuria, frequency or materia.  When asked, the patient stated that he may have eaten something that was very salty this past weekend.  ED Course: Initial vital signs were temperature99.4 F, pulse 117, respirations 22, BP 173/100 mmHg O2 sat 98% on NRB at 15 LPM.  The patient was given calcium gluconate, Lokelma and 80 mg of furosemide IVP.  I added Zofran 4 mg IVP and Nitropaste.  Lab work: His COVID PCR was positive. CBC showed white count of 10.2, hemoglobin 10.4 g/dL with an MCV of 106.2 fL and platelets 261.  Troponin was 38 and then 46 ng/L.  BNP was 1637.0 pg/mL.  CMP showed potassium of 6.1 mmol/L.  Glucose 103, calcium 8.3, BUN of 45 and creatinine 10.63 mg/dL.  Imaging: His chest radiograph showed cardiomegaly with no active disease.  Please see image and phonated report for further detail.  Review of Systems: As per HPI otherwise all other systems reviewed and are negative.  Past Medical History:  Diagnosis Date   Anemia    Anxiety    Blood transfusion without reported diagnosis    CHF (congestive heart failure) ([place])    a. EF 25% by echo in 07/2019    Chronic kidney disease    Depression    Diabetes mellitus without complication ([place])    End stage renal disease ([place])    M/W/F [facility]   Hyperlipidemia    Neuropathy    Peripheral vascular disease (HCC)    PTSD (post-traumatic stress disorder)    Past Surgical History:  Procedure Laterality Date   A/V FISTULAGRAM N/A 10/09/2018   Procedure: A/V FISTULAGRAM;  Surgeon: Serafina Mitchell, MD;  Location: [place] [facility];  Service: Cardiovascular;  Laterality: N/A;   AV FISTULA PLACEMENT Left 09/22/2016   Procedure: CREATION OF LEFT ARM ARTERIOVENOUS (AV) FISTULA;  Surgeon: Waynetta Sandy, MD;  Location: [place];  Service: Vascular;  Laterality: Left;   AV FISTULA PLACEMENT Left 10/31/2017   Procedure: INSERTION OF ARTERIOVENOUS (AV) GORE-TEX 4-46m STETCH GRAFT LEFT ARM;  Surgeon: CWaynetta Sandy MD;  Location: [place]  Service: Vascular;  Laterality: Left;   BH[place]Left 08/17/2017   Procedure: SECOND STAGE BASILIC VEIN TRANSPOSITION LEFT ARM;  Surgeon: CWaynetta Sandy MD;  [place] [facility]  Service: Vascular;  Laterality: Left;   BELOW KNEE LEG AMPUTATION Right    CARDIOVERSION N/A 02/19/2019   Procedure: CARDIOVERSION;  Surgeon: HPixie Casino MD;  [place]  Service: Cardiovascular;  Laterality: N/A;   CHOLECYSTECTOMY     FOOT SURGERY     IR FLUORO GUIDE CV LINE RIGHT  05/16/2016   IR FLUORO GUIDE CV LINE RIGHT  02/16/2019   IR REMOVAL TUN CV CATH  W/O FL  05/16/2016   IR REMOVAL TUN CV CATH W/O FL  02/19/2019   IR THROMBECTOMY AV FISTULA W/THROMBOLYSIS/PTA INC/SHUNT/IMG LEFT Left 11/09/2018   IR THROMBECTOMY AV FISTULA W/THROMBOLYSIS/PTA INC/SHUNT/IMG LEFT Left 06/22/2019   IR US GUIDE VASC ACCESS LEFT  11/09/2018   IR US GUIDE VASC ACCESS LEFT  06/22/2019   IR US GUIDE VASC ACCESS RIGHT  05/16/2016   IR US GUIDE VASC ACCESS RIGHT  02/16/2019   PERIPHERAL VASCULAR BALLOON ANGIOPLASTY  10/09/2018   Procedure: PERIPHERAL VASCULAR  BALLOON ANGIOPLASTY;  Surgeon: Serafina Mitchell, MD;  Location: [place] [facility];  Service: Cardiovascular;;   TEE WITHOUT CARDIOVERSION N/A 02/19/2019   Procedure: TRANSESOPHAGEAL ECHOCARDIOGRAM (TEE);  Surgeon: [name], MD;  Location: [facility];  Service: Cardiovascular;  Laterality: N/A;   Social History  reports that he has been smoking. He has never used smokeless tobacco. He reports that he does not drink alcohol and does not use drugs.  Allergies  Allergen Reactions   Tape Other (See Comments)    Pulls skin off   Other    Family History  Problem Relation Age of Onset   Cancer Mother        lung   Diabetes Mother    Heart attack Father    Diabetes Father    Diabetes Sister    Prior to Admission medications   Medication Sig Start Date End Date Taking? Authorizing Provider  apixaban (ELIQUIS) 5 MG TABS tablet Take 1 tablet (5 mg total) by mouth 2 (two) times daily. 08/23/19   Strader, Fransisco Hertz, PA-C  aspirin 81 MG chewable tablet Chew 81 mg by mouth daily.    [provider]  atorvastatin (LIPITOR) 10 MG tablet Take 1 tablet (10 mg total) by mouth every evening. 08/03/19   Barton Dubois, MD  calcium acetate (PHOSLO) 667 MG capsule Take 1 capsule (667 mg total) by mouth 3 (three) times daily with meals. 02/21/19   Donnamae Jude, MD  calcium carbonate (TUMS - DOSED IN MG ELEMENTAL CALCIUM) 500 MG chewable tablet Chew 4 tablets (800 mg of elemental calcium total) by mouth 2 (two) times daily. 08/03/19   Barton Dubois, MD  carvedilol (COREG) 3.125 MG tablet Take 1 tablet (3.125 mg total) by mouth 2 (two) times daily with a meal. 04/09/20 05/09/20  Shahmehdi, Valeria Batman, MD  DULoxetine (CYMBALTA) 30 MG capsule Take 30 mg by mouth 2 (two) times daily. 12/26/19   [provider]  LANTUS SOLOSTAR 100 UNIT/ML Solostar Pen Inject 10 Units into the skin at bedtime.  01/18/19   [provider]  lidocaine-prilocaine (EMLA) cream Apply 1 application topically  See admin instructions. Applied three times weekly at dialysis on MWF 05/06/19   [provider]  multivitamin (RENA-VIT) TABS tablet Take 1 tablet by mouth daily. 08/04/18   [provider]  patiromer (VELTASSA) 8.4 g packet Take 1 packet (8.4 g total) by mouth 3 (three) times a week. On Tuesday, Thursday and Saturday 06/10/19   Kathie Dike, MD    Physical Exam: Vitals:   07/20/20 0400 07/20/20 0430 07/20/20 0500 07/20/20 0554  BP: (!) 180/87 (!) 171/52 (!) 173/75 (!) 168/99  Pulse: (!) 115 (!) 123 (!) 108 (!) 124  Resp: (!) 26 (!) 30 (!) 24 (!) 26  Temp:      TempSrc:      SpO2: 96% 98% 97% 100%    Constitutional: Looks acutely ill.  NAD, calm, comfortable Eyes: PERRL, lids and  conjunctivae normal.  Injected sclera. ENMT: Mucous membranes are moist. Posterior pharynx clear of any exudate or lesions. Neck: normal, supple, no masses, no thyromegaly Respiratory: Tachypneic.  Decreased breath sounds with scattered crackles.  No accessory muscle use.  Cardiovascular: Tachycardic in the 100s and 110s. no murmurs / rubs / gallops. No extremity edema. 2+ pedal pulses. No carotid bruits.  Abdomen: Obese, no distention.  No tenderness, no masses palpated. No hepatosplenomegaly. Bowel sounds positive.  Musculoskeletal: Generalized weakness.  Right BKA.  No clubbing / cyanosis. Good ROM, no contractures. Normal muscle tone.  Skin: no rashes, lesions, ulcers on limited dermatological examination. Neurologic: CN 2-12 grossly intact. Sensation intact, DTR normal. Strength 5/5 in all 4.  Psychiatric: Normal judgment and insight. Alert and oriented x 3. Normal mood.   Labs on Admission: I have personally reviewed following labs and imaging studies  CBC: Recent Labs  Lab 07/20/20 0248  WBC 10.2  NEUTROABS 8.4*  HGB 10.4*  HCT 32.7*  MCV 106.2*  PLT 0000000    Basic Metabolic Panel: Recent Labs  Lab 07/20/20 0248  NA 137  K 6.1*  CL 99  CO2 26  GLUCOSE 103*  BUN 45*   CREATININE 10.63*  CALCIUM 8.3*    GFR: CrCl cannot be calculated (Unknown ideal weight.).  Liver Function Tests: Recent Labs  Lab 07/20/20 0248  AST 15  ALT 11  ALKPHOS 69  BILITOT 0.7  PROT 7.5  ALBUMIN 3.9    Urine analysis: No results found for: COLORURINE, APPEARANCEUR, [place], [place], GLUCOSEU, HGBUR, BILIRUBINUR, KETONESUR, PROTEINUR, UROBILINOGEN, NITRITE, LEUKOCYTESUR  Radiological Exams on Admission: DG Chest Portable 1 View  Result Date: 07/20/2020 CLINICAL DATA:  Chest pain, shortness of breath EXAM: PORTABLE CHEST 1 VIEW COMPARISON:  07/03/2020 FINDINGS: Heart is borderline in size. No confluent airspace opacities, effusions or edema. No acute bony abnormality. IMPRESSION: No active disease. Electronically Signed   By: Rolm Baptise M.D.   On: 07/20/2020 02:53    04/08/2020 echocardiogram IMPRESSIONS    1. Left ventricular ejection fraction, by estimation, is 20 to 25%. The  left ventricle has severely decreased function. The left ventricle  demonstrates global hypokinesis. There is severe left ventricular  hypertrophy. Left ventricular diastolic  parameters are indeterminate.   2. Right ventricular systolic function is normal. The right ventricular  size is normal.   3. The mitral valve is normal in structure. No evidence of mitral valve  regurgitation. No evidence of mitral stenosis.   4. The aortic valve has an indeterminant number of cusps. There is  moderate calcification of the aortic valve. There is moderate thickening  of the aortic valve. Aortic valve regurgitation is not visualized. No  aortic stenosis is present.   5. The inferior vena cava is normal in size with greater than 50%  respiratory variability, suggesting right atrial pressure of 3 mmHg.   EKG: Independently reviewed.  Vent. rate 117 BPM PR interval 117 ms QRS duration 137 ms QT/QTcB 388/542 ms P-R-T axes 232 -55 99 Sinus or ectopic atrial tachycardia Left bundle branch  block  Assessment/Plan Principal Problem: Acute respiratory failure with hypoxia (HCC) In the setting of  1- COVID-19 disease  Continue supplemental oxygen. Incentive spirometry and flutter valve. Acetaminophen as needed. Antitussives as needed. Remdesivir per pharmacy. Dexamethasone 6 mg daily. Follow-up CBC, CMP and inflammatory markers.  2 - Volume overload  due to ESRD on hemodialysis ([place]) and    Acute on chronic combined systolic and diastolic CHF  Admit to PCU/observation.  Continue supplemental oxygen. Dr. Jonnie Finner requested transfer to [facility] for dialysis. Received furosemide earlier. Continue Nitropaste.  Active Problems:   Type 2 diabetes mellitus with diabetic chronic kidney disease ([place]) Started on COVID-19 receiving steroids glucose control order set. And long-acting insulin switched to BiV while on steroids.    Hyperlipidemia   Anemia due to end stage renal disease (HCC) Monitor H&H. Erythropoietin per nephrology.    Hyperkalemia Treated by Dr. Dayna Barker earlier   Chronic atrial fibrillation [facility])   DVT prophylaxis: On apixaban. Code Status:   Full code. Family Communication:   Disposition Plan:   Patient is from:  Home.  Anticipated DC to:  Home.  Anticipated DC date:  07/22/2020  Anticipated DC barriers: Clinical status.  Consults called:  Roney Jaffe, MD (nephrology). Admission status:  Observation/progressive care unit.   Severity of Illness: High severity due to presenting with acute respiratory failure in the setting of hypoxia and subsequently tested positive for COVID-19.  The patient will be admitted for dialysis and coronavirus treatment.  Reubin Milan MD [facility]  How to contact the [facility] Attending or Consulting provider [place] or covering provider during after hours [place], for this patient?   Check the care team in [facility] and look for a) attending/consulting TR[facility] provider listed and b) the [facility] team listed Log into  [email or web address] and use [place]'s universal password to access. If you do not have the password, please contact the hospital operator. Locate the [facility] provider you are looking for under [facility] and page to a number that you can be directly reached. If you still have difficulty reaching the provider, please page the [facility] (Director on Call) for the Hospitalists listed on amion for assistance.  07/20/2020, 6:01 AM   This document was prepared using Dragon voice recognition software and may contain some unintended transcription errors.

## 2020-07-20 NOTE — ED Notes (Signed)
Pt placed on cardiac monitor with BP to set cycle every 30 minutes. Continuous pulse oximeter applied.  

## 2020-07-20 NOTE — ED Notes (Signed)
Pt co nausea, currently with dry heaves. Dr. Olevia Bowens notified.

## 2020-07-20 NOTE — Consult Note (Signed)
[facility] Renal Consultation Note    Indication for Consultation:  Management of ESRD/hemodialysis; anemia, hypertension/volume and secondary hyperparathyroidism  HPI: Brendan Campbell is a 50 y.o. male male with a PMH significant for DM, HTN, PTSD, HLD, CHF, noncompliance, PVD s/p RBKA, and ESRD on HD at [facility] every MWF who was brought to [facility] ED via EMS with worsening shortness of breath and chest pain.  In the ED he was febrile at 99.4, tachycardic at 117, hypertensive at 173/100, and hypoxic requiring high flow oxygen.  His covid PC was +, and labs notable for K of 6.1, Hgb 10.4, BNP 1637.  CXR did not show active disease.  He was admitted for further evaluation and we were consulted to provide dialysis during his hospitalization.   Past Medical History:  Diagnosis Date   Anemia    Anxiety    Blood transfusion without reported diagnosis    CHF (congestive heart failure) ([place])    a. EF 25% by echo in 07/2019   Chronic kidney disease    Depression    Diabetes mellitus without complication ([place])    End stage renal disease ([name])    M/W/F [facility]   Hyperlipidemia    Neuropathy    Peripheral vascular disease (HCC)    PTSD (post-traumatic stress disorder)    Past Surgical History:  Procedure Laterality Date   A/V FISTULAGRAM N/A 10/09/2018   Procedure: A/V FISTULAGRAM;  Surgeon: Serafina Mitchell, MD;  Location: [place] [facility];  Service: Cardiovascular;  Laterality: N/A;   AV FISTULA PLACEMENT Left 09/22/2016   Procedure: CREATION OF LEFT ARM ARTERIOVENOUS (AV) FISTULA;  Surgeon: Waynetta Sandy, MD;  Location: [place];  Service: Vascular;  Laterality: Left;   AV FISTULA PLACEMENT Left 10/31/2017   Procedure: INSERTION OF ARTERIOVENOUS (AV) GORE-TEX 4-30m STETCH GRAFT LEFT ARM;  Surgeon: CWaynetta Sandy MD;  [place]  Service: Vascular;  Laterality: Left;   [place] 08/17/2017   Procedure: SECOND STAGE BASILIC VEIN  TRANSPOSITION LEFT ARM;  Surgeon: CWaynetta Sandy MD;  [place]  Service: Vascular;  Laterality: Left;   BELOW KNEE LEG AMPUTATION Right    CARDIOVERSION N/A 02/19/2019   Procedure: CARDIOVERSION;  Surgeon: HPixie Casino MD;  [facility];  Service: Cardiovascular;  Laterality: N/A;   CHOLECYSTECTOMY     FOOT SURGERY     IR FLUORO GUIDE CV LINE RIGHT  05/16/2016   IR FLUORO GUIDE CV LINE RIGHT  02/16/2019   IR REMOVAL TUN CV CATH W/O FL  05/16/2016   IR REMOVAL TUN CV CATH W/O FL  02/19/2019   IR THROMBECTOMY AV FISTULA W/THROMBOLYSIS/PTA INC/SHUNT/IMG LEFT Left 11/09/2018   IR THROMBECTOMY AV FISTULA W/THROMBOLYSIS/PTA INC/SHUNT/IMG LEFT Left 06/22/2019   IR [place] VASC ACCESS LEFT  11/09/2018   IR [place] VASC ACCESS LEFT  06/22/2019   IR [place] VASC ACCESS RIGHT  05/16/2016   IR [place] VASC ACCESS RIGHT  02/16/2019   PERIPHERAL VASCULAR BALLOON ANGIOPLASTY  10/09/2018   Procedure: PERIPHERAL VASCULAR BALLOON ANGIOPLASTY;  Surgeon: BSerafina Mitchell MD;  [facility];  Service: Cardiovascular;;   TEE WITHOUT CARDIOVERSION N/A 02/19/2019   Procedure: TRANSESOPHAGEAL ECHOCARDIOGRAM (TEE);  Surgeon: HPixie Casino MD;  [facility]  Service: Cardiovascular;  Laterality: N/A;   Family History:   Family History  Problem Relation Age of Onset   Cancer Mother  lung   Diabetes Mother    Heart attack Father    Diabetes Father    Diabetes Sister    Social History:  reports that he has been smoking. He has never used smokeless tobacco. He reports that he does not drink alcohol and does not use drugs. Allergies  Allergen Reactions   Tape Other (See Comments)    Pulls skin off   Other    Prior to Admission medications   Medication Sig Start Date End Date Taking? Authorizing Provider  apixaban (ELIQUIS) 5 MG TABS tablet Take 1 tablet (5 mg total) by mouth 2 (two) times daily. 08/23/19   Strader, Fransisco Hertz, PA-C  aspirin 81 MG  chewable tablet Chew 81 mg by mouth daily.    [provider]  atorvastatin (LIPITOR) 10 MG tablet Take 1 tablet (10 mg total) by mouth every evening. 08/03/19   Barton Dubois, MD  calcium acetate (PHOSLO) 667 MG capsule Take 1 capsule (667 mg total) by mouth 3 (three) times daily with meals. 02/21/19   Donnamae Jude, MD  calcium carbonate (TUMS - DOSED IN MG ELEMENTAL CALCIUM) 500 MG chewable tablet Chew 4 tablets (800 mg of elemental calcium total) by mouth 2 (two) times daily. 08/03/19   Barton Dubois, MD  carvedilol (COREG) 3.125 MG tablet Take 1 tablet (3.125 mg total) by mouth 2 (two) times daily with a meal. 04/09/20 05/09/20  Shahmehdi, Valeria Batman, MD  DULoxetine (CYMBALTA) 30 MG capsule Take 30 mg by mouth 2 (two) times daily. 12/26/19   [provider]  LANTUS SOLOSTAR 100 UNIT/ML Solostar Pen Inject 10 Units into the skin at bedtime.  01/18/19   [provider]  lidocaine-prilocaine (EMLA) cream Apply 1 application topically See admin instructions. Applied three times weekly at dialysis on MWF 05/06/19   [provider]  multivitamin (RENA-VIT) TABS tablet Take 1 tablet by mouth daily. 08/04/18   [provider]  patiromer (VELTASSA) 8.4 g packet Take 1 packet (8.4 g total) by mouth 3 (three) times a week. On Tuesday, Thursday and Saturday 06/10/19   Kathie Dike, MD   Current Facility-Administered Medications  Medication Dose Route Frequency Provider Last Rate Last Admin   acetaminophen (TYLENOL) tablet 650 mg  650 mg Oral Q6H PRN Reubin Milan, MD       Or   acetaminophen (TYLENOL) suppository 650 mg  650 mg Rectal Q6H PRN Reubin Milan, MD       albuterol (VENTOLIN HFA) 108 (90 Base) MCG/ACT inhaler 2 puff  2 puff Inhalation Q4H PRN Reubin Milan, MD       apixaban [facility] [place]) tablet 5 mg  5 mg Oral BID Manuella Ghazi, Pratik D, DO       aspirin chewable tablet 81 mg  81 mg Oral Daily Shah, Pratik D, DO       atorvastatin (LIPITOR) tablet 10  mg  10 mg Oral QPM Shah, Pratik D, DO       calcium acetate (PHOSLO) capsule 667 mg  667 mg Oral TID WC Shah, Pratik D, DO       calcium carbonate (TUMS - dosed in mg elemental calcium) chewable tablet 800 mg of elemental calcium  800 mg of elemental calcium Oral BID Manuella Ghazi, Pratik D, DO       Chlorhexidine Gluconate Cloth 2 % PADS 6 each  6 each Topical Q0600 Roney Jaffe, MD       dexamethasone (DECADRON) injection 6 mg  6 mg Intravenous Q24H Olevia Bowens,  Gerri Lins, MD   6 mg at 07/20/20 0859   DULoxetine (CYMBALTA) DR capsule 30 mg  30 mg Oral BID Manuella Ghazi, Pratik D, DO       insulin aspart (novoLOG) injection 0-9 Units  0-9 Units Subcutaneous TID WC Reubin Milan, MD       insulin detemir (LEVEMIR) injection 8 Units  0.075 Units/kg Subcutaneous BID Reubin Milan, MD       linagliptin [facility]) tablet 5 mg  5 mg Oral Daily Reubin Milan, MD       multivitamin (RENA-VIT) tablet 1 tablet  1 tablet Oral Daily Manuella Ghazi, Pratik D, DO       nitroGLYCERIN (NITROGLYN) 2 % ointment 1 inch  1 inch Topical Q6H Reubin Milan, MD   1 inch at 07/20/20 0711   ondansetron (ZOFRAN) injection 4 mg  4 mg Intravenous Q6H PRN Reubin Milan, MD   4 mg at 07/20/20 0552   [START ON 07/21/2020] remdesivir 100 mg in sodium chloride 0.9 % 100 mL IVPB  100 mg Intravenous Daily Laren Everts, [facility]       Current Outpatient Medications  Medication Sig Dispense Refill   apixaban (ELIQUIS) 5 MG TABS tablet Take 1 tablet (5 mg total) by mouth 2 (two) times daily. 180 tablet 3   aspirin 81 MG chewable tablet Chew 81 mg by mouth daily.     atorvastatin (LIPITOR) 10 MG tablet Take 1 tablet (10 mg total) by mouth every evening. 90 tablet 2   calcium acetate (PHOSLO) 667 MG capsule Take 1 capsule (667 mg total) by mouth 3 (three) times daily with meals. 90 capsule 2   calcium carbonate (TUMS - DOSED IN MG ELEMENTAL CALCIUM) 500 MG chewable tablet Chew 4 tablets (800 mg of elemental calcium total) by mouth 2  (two) times daily. 60 tablet 2   carvedilol (COREG) 3.125 MG tablet Take 1 tablet (3.125 mg total) by mouth 2 (two) times daily with a meal. 180 tablet 3   DULoxetine (CYMBALTA) 30 MG capsule Take 30 mg by mouth 2 (two) times daily.     LANTUS SOLOSTAR 100 UNIT/ML Solostar Pen Inject 10 Units into the skin at bedtime.      lidocaine-prilocaine (EMLA) cream Apply 1 application topically See admin instructions. Applied three times weekly at dialysis on MWF     multivitamin (RENA-VIT) TABS tablet Take 1 tablet by mouth daily.     patiromer (VELTASSA) 8.4 g packet Take 1 packet (8.4 g total) by mouth 3 (three) times a week. On Tuesday, Thursday and Saturday 30 each 0   Labs: Basic Metabolic Panel: Recent Labs  Lab 07/20/20 0248  NA 137  K 6.1*  CL 99  CO2 26  GLUCOSE 103*  BUN 45*  CREATININE 10.63*  CALCIUM 8.3*   Liver Function Tests: Recent Labs  Lab 07/20/20 0248  AST 15  ALT 11  ALKPHOS 69  BILITOT 0.7  PROT 7.5  ALBUMIN 3.9   No results for input(s): LIPASE, AMYLASE in the last 168 hours. No results for input(s): AMMONIA in the last 168 hours. CBC: Recent Labs  Lab 07/20/20 0248  WBC 10.2  NEUTROABS 8.4*  HGB 10.4*  HCT 32.7*  MCV 106.2*  PLT 261   Cardiac Enzymes: No results for input(s): CKTOTAL, CKMB, CKMBINDEX, TROPONINI in the last 168 hours. CBG: Recent Labs  Lab 07/20/20 0850  GLUCAP 104*   Iron Studies:  Recent Labs    07/20/20 0248  FERRITIN 1,303*  Studies/Results: DG Chest Portable 1 View  Result Date: 07/20/2020 CLINICAL DATA:  Chest pain, shortness of breath EXAM: PORTABLE CHEST 1 VIEW COMPARISON:  07/03/2020 FINDINGS: Heart is borderline in size. No confluent airspace opacities, effusions or edema. No acute bony abnormality. IMPRESSION: No active disease. Electronically Signed   By: Rolm Baptise M.D.   On: 07/20/2020 02:53    ROS: Pertinent items are noted in HPI. Physical Exam: Vitals:   07/20/20 0630 07/20/20 0755 07/20/20 1000  07/20/20 1030  BP: (!) 180/95  (!) 148/73 (!) 144/66  Pulse: (!) 110  92 86  Resp:    (!) 24  Temp:      TempSrc:      SpO2: 100%  100% 100%  Weight:  102.6 kg    Height:  6' (1.829 m)        Weight change:   Intake/Output Summary (Last 24 hours) at 07/20/2020 1056 Last data filed at 07/20/2020 1014 Gross per 24 hour  Intake 200 ml  Output --  Net 200 ml   BP (!) 144/66   Pulse 86   Temp 99.4 F (37.4 C) (Oral)   Resp (!) 24   Ht 6' (1.829 m)   Wt 102.6 kg   SpO2 100%   BMI 30.68 kg/m  Physical exam: unable to complete due to COVID + status.  In order to preserve PPE equipment and to minimize exposure to providers.  Notes from other caregivers reviewed  Dialysis Access: LUE AVG   Dialysis:  [facility]  on MWF  4h 70mn  98 kg   1K/2.25 bath  Hep 1000+ 500u/hr  LUE AVG  300/500   - prosthetic weighs 1.6 kg  - Epogen 1000   Units IV/HD    Assessment/Plan:  Acute hypoxic respiratory failure - in setting of covid-19.  Remdesivir per primary as well as dexamethasone.  Will plan for HD with UF today as well and follow his response  ESRD -  continue with HD on MWF schedule  Hypertension/volume  - UF as tolerated  Anemia  - continue with ESA  Metabolic bone disease -  continue with outpatient meds  Nutrition - renal diet Hyperkalemia - follow after HD.   JDonetta Potts MD CBloomvillePager ([phone]/13/2022, 10:56 AM

## 2020-07-20 NOTE — Progress Notes (Signed)
[name] is a 50 y.o. male with medical history significant of ESRD anemia, anxiety, depression, PTSD, chronic systolic CHF, type II DM, hyperlipidemia, diabetic peripheral neuropathy, PVD who is coming to the emergency department with complaints of progressively worse dyspnea since yesterday evening associated with chest pressure, diaphoresis, LLE edema and orthopnea.  He has been admitted with acute hypoxemic respiratory failure in the setting of COVID-19 pneumonia.  He has been started on remdesivir and dexamethasone.  Plans for hemodialysis on 6/13.  Acute on chronic hypoxemic respiratory failure secondary to COVID-19 pneumonia -Continue remdesivir and Decadron -Wean to baseline 2 L nasal cannula oxygen  Volume overload with acute on chronic combined systolic and diastolic CHF exacerbation -Dialysis per nephrology  Type 2 diabetes with peripheral neuropathy -Continue SSI and monitor -Continue Tradjenta -Continue Levemir  History of atrial fibrillation on Eliquis -Monitor on telemetry  Dyslipidemia -Continue statin  Hyperkalemia -Hemodialysis per nephrology  Headache -Likely related to recent steroid administration -Trial of Fioricet  Total care time: 35 minutes.

## 2020-07-20 NOTE — ED Provider Notes (Signed)
[facility] EMERGENCY DEPARTMENT Provider Note   CSN: [id number] Arrival date & time: 07/20/20  0214     History Chief Complaint  Patient presents with   Shortness of Breath    Brendan Campbell is a 50 y.o. male.  M/w/f dialysis schedule. No misses recently. Here with dyspnea.   The history is provided by the patient.  Shortness of Breath Severity:  Moderate Duration:  4 hours Timing:  Constant Progression:  Worsening Chronicity:  Recurrent Context: not activity, not pollens and not URI       Past Medical History:  Diagnosis Date   Anemia    Anxiety    Blood transfusion without reported diagnosis    CHF (congestive heart failure) (HCC)    a. EF 25% by echo in 07/2019   Chronic kidney disease    Depression    Diabetes mellitus without complication ([place])    End stage renal disease ([place])    M/W/F [facility]   Hyperlipidemia    Neuropathy    Peripheral vascular disease (HCC)    PTSD (post-traumatic stress disorder)     Patient Active Problem List   Diagnosis Date Noted   Volume overload 07/20/2020   Acute respiratory failure ([place]) 07/03/2020   Acute on chronic combined systolic and diastolic CHF (congestive heart failure) ([place]) 04/08/2020   Left hemiparesis ([place]) 04/08/2020   Lumbar spondylosis 04/08/2020   Atrial fibrillation with RVR ([place]) 04/08/2020   Elevated troponin 04/08/2020   Pain in limb 12/29/2019   Headache disorder 12/29/2019   Pain in left arm 12/29/2019   Cervical radiculopathy 12/29/2019   Acute congestive heart failure ([place]) 12/13/2019   Macrocytic anemia 09/25/2019   Paresthesia 09/24/2019   Chronic atrial fibrillation ([place])    Hyperparathyroidism ([place])    Cardiac arrest ([place]) 06/07/2019   Neuropathy    Peripheral vascular disease (HCC)    Prolonged QT interval    Leukocytosis    Atypical pneumonia    Acute pulmonary edema (HCC)    ESRD on hemodialysis ([place])    Hyperkalemia 10/05/2018   Weakness 10/05/2018   Anemia due to end  stage renal disease ([place]) 05/10/2016   Spondylosis of lumbar spine 03/08/2016   Lumbar radiculopathy 03/08/2016   S/P below knee amputation, right ([place]) 03/05/2015   Type 2 diabetes mellitus with diabetic chronic kidney disease ([place]) 03/03/2015   Hyperlipidemia 03/03/2015   Low back pain 03/03/2015    Past Surgical History:  Procedure Laterality Date   A/V FISTULAGRAM N/A 10/09/2018   Procedure: A/V FISTULAGRAM;  Surgeon: Serafina Mitchell, MD;  Location: [facility];  Service: Cardiovascular;  Laterality: N/A;   AV FISTULA PLACEMENT Left 09/22/2016   Procedure: CREATION OF LEFT ARM ARTERIOVENOUS (AV) FISTULA;  Surgeon: Waynetta Sandy, MD;  Location: [place];  Service: Vascular;  Laterality: Left;   AV FISTULA PLACEMENT Left 10/31/2017   Procedure: INSERTION OF ARTERIOVENOUS (AV) GORE-TEX 4-41m STETCH GRAFT LEFT ARM;  Surgeon: CWaynetta Sandy MD;  [place]  Service: Vascular;  Laterality: Left;   BBaldwinLeft 08/17/2017   Procedure: SECOND STAGE BASILIC VEIN TRANSPOSITION LEFT ARM;  Surgeon: CWaynetta Sandy MD;  [place] [facility]  Service: Vascular;  Laterality: Left;   BELOW KNEE LEG AMPUTATION Right    CARDIOVERSION N/A 02/19/2019   Procedure: CARDIOVERSION;  Surgeon: HPixie Casino MD;  [place]  Service: Cardiovascular;  Laterality: N/A;   CHOLECYSTECTOMY     FOOT SURGERY  IR FLUORO GUIDE CV LINE RIGHT  05/16/2016   IR FLUORO GUIDE CV LINE RIGHT  02/16/2019   IR REMOVAL TUN CV CATH W/O FL  05/16/2016   IR REMOVAL TUN CV CATH W/O FL  02/19/2019   IR THROMBECTOMY AV FISTULA W/THROMBOLYSIS/PTA INC/SHUNT/IMG LEFT Left 11/09/2018   IR THROMBECTOMY AV FISTULA W/THROMBOLYSIS/PTA INC/SHUNT/IMG LEFT Left 06/22/2019   IR US GUIDE VASC ACCESS LEFT  11/09/2018   IR US GUIDE VASC ACCESS LEFT  06/22/2019   IR US GUIDE VASC ACCESS RIGHT  05/16/2016   IR US GUIDE VASC ACCESS RIGHT  02/16/2019   PERIPHERAL VASCULAR BALLOON  ANGIOPLASTY  10/09/2018   Procedure: PERIPHERAL VASCULAR BALLOON ANGIOPLASTY;  Surgeon: Serafina Mitchell, MD;  Location: [facility] [facility];  Service: Cardiovascular;;   TEE WITHOUT CARDIOVERSION N/A 02/19/2019   Procedure: TRANSESOPHAGEAL ECHOCARDIOGRAM (TEE);  Surgeon: [name], MD;  [facility];  Service: Cardiovascular;  Laterality: N/A;       Family History  Problem Relation Age of Onset   Cancer Mother        lung   Diabetes Mother    Heart attack Father    Diabetes Father    Diabetes Sister     Social History   Tobacco Use   Smoking status: Some Days    Pack years: 0.00    Types: Cigarettes    Last attempt to quit: 09/03/2006    Years since quitting: 13.8   Smokeless tobacco: Never  Vaping Use   Vaping Use: Never used  Substance Use Topics   Alcohol use: No   Drug use: No    Home Medications Prior to Admission medications   Medication Sig Start Date End Date Taking? Authorizing Provider  apixaban (ELIQUIS) 5 MG TABS tablet Take 1 tablet (5 mg total) by mouth 2 (two) times daily. 08/23/19   Strader, Fransisco Hertz, PA-C  aspirin 81 MG chewable tablet Chew 81 mg by mouth daily.    [provider]  atorvastatin (LIPITOR) 10 MG tablet Take 1 tablet (10 mg total) by mouth every evening. 08/03/19   Barton Dubois, MD  calcium acetate (PHOSLO) 667 MG capsule Take 1 capsule (667 mg total) by mouth 3 (three) times daily with meals. 02/21/19   Donnamae Jude, MD  calcium carbonate (TUMS - DOSED IN MG ELEMENTAL CALCIUM) 500 MG chewable tablet Chew 4 tablets (800 mg of elemental calcium total) by mouth 2 (two) times daily. 08/03/19   Barton Dubois, MD  carvedilol (COREG) 3.125 MG tablet Take 1 tablet (3.125 mg total) by mouth 2 (two) times daily with a meal. 04/09/20 05/09/20  Shahmehdi, Valeria Batman, MD  DULoxetine (CYMBALTA) 30 MG capsule Take 30 mg by mouth 2 (two) times daily. 12/26/19   [provider]  LANTUS SOLOSTAR 100 UNIT/ML Solostar Pen Inject 10  Units into the skin at bedtime.  01/18/19   [provider]  lidocaine-prilocaine (EMLA) cream Apply 1 application topically See admin instructions. Applied three times weekly at dialysis on MWF 05/06/19   [provider]  multivitamin (RENA-VIT) TABS tablet Take 1 tablet by mouth daily. 08/04/18   [provider]  patiromer (VELTASSA) 8.4 g packet Take 1 packet (8.4 g total) by mouth 3 (three) times a week. On Tuesday, Thursday and Saturday 06/10/19   Kathie Dike, MD    Allergies    Tape and Other  Review of Systems   Review of Systems  Respiratory:  Positive for shortness of breath.  All other systems reviewed and are negative.  Physical Exam Updated Vital Signs BP (!) 171/52   Pulse (!) 123   Temp 99.4 F (37.4 C) (Oral)   Resp (!) 30   SpO2 98%   Physical Exam Vitals and nursing note reviewed.  Constitutional:      Appearance: Brendan Campbell is well-developed.  HENT:     Head: Normocephalic and atraumatic.     Nose: Nose normal. No congestion or rhinorrhea.     Mouth/Throat:     Mouth: Mucous membranes are moist.     Pharynx: Oropharynx is clear.  Cardiovascular:     Rate and Rhythm: Normal rate.  Pulmonary:     Effort: Pulmonary effort is normal. No respiratory distress.     Breath sounds: Rales (mild, BLL) present.  Abdominal:     General: There is no distension.  Musculoskeletal:        General: Normal range of motion.     Cervical back: Normal range of motion.  Skin:    General: Skin is warm and dry.  Neurological:     General: No focal deficit present.     Mental Status: Brendan Campbell is alert.    ED Results / Procedures / Treatments   Labs (all labs ordered are listed, but only abnormal results are displayed) Labs Reviewed  CBC WITH DIFFERENTIAL/PLATELET - Abnormal; Notable for the following components:      Result Value   RBC 3.08 (*)    Hemoglobin 10.4 (*)    HCT 32.7 (*)    MCV 106.2 (*)    Neutro Abs 8.4 (*)    Lymphs Abs 0.6 (*)    All  other components within normal limits  COMPREHENSIVE METABOLIC PANEL - Abnormal; Notable for the following components:   Potassium 6.1 (*)    Glucose, Bld 103 (*)    BUN 45 (*)    Creatinine, Ser 10.63 (*)    Calcium 8.3 (*)    GFR, Estimated 5 (*)    All other components within normal limits  BRAIN NATRIURETIC PEPTIDE - Abnormal; Notable for the following components:   B Natriuretic Peptide 1,637.0 (*)    All other components within normal limits  TROPONIN I (HIGH SENSITIVITY) - Abnormal; Notable for the following components:   Troponin I (High Sensitivity) 38 (*)    All other components within normal limits  RESP PANEL BY RT-PCR (FLU A&B, COVID) [id number]  TROPONIN I (HIGH SENSITIVITY)    EKG EKG Interpretation  Date/Time:  Monday July 20 2020 02:31:21 EDT Ventricular Rate:  117 PR Interval:  117 QRS Duration: 137 QT Interval:  388 QTC Calculation: 542 R Axis:   -55 Text Interpretation: Sinus or ectopic atrial tachycardia Left bundle branch block Confirmed by Merrily Pew [phone]) on 07/20/2020 2:33:52 AM  Radiology DG Chest Portable 1 View  Result Date: 07/20/2020 CLINICAL DATA:  Chest pain, shortness of breath EXAM: PORTABLE CHEST 1 VIEW COMPARISON:  07/03/2020 FINDINGS: Heart is borderline in size. No confluent airspace opacities, effusions or edema. No acute bony abnormality. IMPRESSION: No active disease. Electronically Signed   By: Rolm Baptise M.D.   On: 07/20/2020 02:53    Procedures .Critical Care  Date/Time: 07/20/2020 5:04 AM Performed by: Merrily Pew, MD Authorized by: Merrily Pew, MD   Critical care provider statement:    Critical care time (minutes):  45   Critical care was necessary to treat or prevent imminent or life-threatening deterioration of the following conditions:  Metabolic crisis and respiratory failure  Critical care was time spent personally by me on the following activities:  Discussions with consultants, evaluation of patient's response to  treatment, examination of patient, ordering and performing treatments and interventions, ordering and review of laboratory studies, ordering and review of radiographic studies, pulse oximetry, re-evaluation of patient's condition, obtaining history from patient or surrogate and review of old charts   Medications Ordered in ED Medications  calcium gluconate 1 g/ 50 mL sodium chloride IVPB (1,000 mg Intravenous New Bag/Given 07/20/20 0450)  Chlorhexidine Gluconate Cloth 2 % PADS 6 each (has no administration in time range)  acetaminophen (TYLENOL) tablet 650 mg (has no administration in time range)    Or  acetaminophen (TYLENOL) suppository 650 mg (has no administration in time range)  sodium zirconium cyclosilicate (LOKELMA) packet 10 g (10 g Oral Given 07/20/20 0447)  furosemide (LASIX) injection 80 mg (80 mg Intravenous Given 07/20/20 0454)    ED Course  I have reviewed the triage vital signs and the nursing notes.  Pertinent labs & imaging results that were available during my care of the patient were reviewed by me and considered in my medical decision making (see chart for details).    MDM Rules/Calculators/A&P                          Xr read as normal by radiology however appears to have worsened intersttial edema and vascular congestion compared to most recent which also fits his clinical picutre. Lokelma/lasix/calcium for hyperkalemia with possible ecg changes. Discussed with Dr. Jonnie Finner who requests [facility] for emergent dialysis. Dr. Betsey Holiday informed, accepts to ED pending dialysis. Dr. Olevia Bowens put bed orders in.   Final Clinical Impression(s) / ED Diagnoses Final diagnoses:  Dyspnea, unspecified type  Hypoxia    Rx / DC Orders ED Discharge Orders     None        Jettson Crable, Corene Cornea, MD 07/20/20 [phone]

## 2020-07-20 NOTE — ED Notes (Signed)
Date and time results received: 07/20/20 0604   Test: COVID Critical Value: POSITIVE  Name of Provider Notified: [place], MD

## 2020-07-20 NOTE — ED Triage Notes (Signed)
Pt bourght in by [facility] for sob that started today, denies fever. Pts sats on room air 85% improved with 4lnc. In triage, sats84% on 4lnc, O2 increased to 61nc, sats increased to89%. 100nrb applied sats increased to 98%. Pt complains of diffuse chest pain.

## 2020-07-20 NOTE — Procedures (Signed)
   HEMODIALYSIS TREATMENT NOTE:   As per usual, difficult cannulation after multiple failed attempts.  17g needles used for venous return - Dr. Jonnie Finner was notified.  Max Qb 250.  1K bath used for the first hour, per Dr. Barkley Bruns verbal order.  Unable to tolerate 4L goal.  UF was interrupted twice and NS was given once for c/o muscle cramping.  He asked to end treatment 30 minutes early.  He was also c/o headache from NTG paste (which has been removed) "and the steroids they gave me in the ER."  Dr. Manuella Ghazi was notified and Fiorecet was ordered.  Reports dyspnea is improved.  O2 weaned to 1L with spO2>95%  Net UF 2.9 liters Total HD time: 3.5 hours  Rockwell Alexandria, RN

## 2020-07-20 NOTE — ED Notes (Signed)
Dr. Jonnie Finner called and ordered transfer cancelled, [facility] notified, Dr. Heath Lark notified.

## 2020-07-20 NOTE — Progress Notes (Addendum)
I have cancelled transfer to [facility], we will plan on dialysis at [facility] this am.    Kelly Splinter, MD 07/20/2020, 7:32 AM

## 2020-07-20 NOTE — ED Notes (Signed)
Patient unable to tolerate non-rebreather mask removed for po pills.

## 2020-07-21 LAB — CBC WITH DIFFERENTIAL/PLATELET
Abs Immature Granulocytes: 0.02 10*3/uL (ref 0.00–0.07)
Basophils Absolute: 0 10*3/uL (ref 0.0–0.1)
Basophils Relative: 0 %
Eosinophils Absolute: 0 10*3/uL (ref 0.0–0.5)
Eosinophils Relative: 0 %
HCT: 31.3 % — ABNORMAL LOW (ref 39.0–52.0)
Hemoglobin: 10.1 g/dL — ABNORMAL LOW (ref 13.0–17.0)
Immature Granulocytes: 0 %
Lymphocytes Relative: 9 %
Lymphs Abs: 0.6 10*3/uL — ABNORMAL LOW (ref 0.7–4.0)
MCH: 33.9 pg (ref 26.0–34.0)
MCHC: 32.3 g/dL (ref 30.0–36.0)
MCV: 105 fL — ABNORMAL HIGH (ref 80.0–100.0)
Monocytes Absolute: 0.6 10*3/uL (ref 0.1–1.0)
Monocytes Relative: 10 %
Neutro Abs: 5.1 10*3/uL (ref 1.7–7.7)
Neutrophils Relative %: 81 %
Platelets: 188 10*3/uL (ref 150–400)
RBC: 2.98 MIL/uL — ABNORMAL LOW (ref 4.22–5.81)
RDW: 12.8 % (ref 11.5–15.5)
WBC: 6.4 10*3/uL (ref 4.0–10.5)
nRBC: 0 % (ref 0.0–0.2)

## 2020-07-21 LAB — COMPREHENSIVE METABOLIC PANEL
ALT: 14 U/L (ref 0–44)
AST: 20 U/L (ref 15–41)
Albumin: 3.5 g/dL (ref 3.5–5.0)
Alkaline Phosphatase: 60 U/L (ref 38–126)
Anion gap: 14 (ref 5–15)
BUN: 45 mg/dL — ABNORMAL HIGH (ref 6–20)
CO2: 25 mmol/L (ref 22–32)
Calcium: 8.4 mg/dL — ABNORMAL LOW (ref 8.9–10.3)
Chloride: 95 mmol/L — ABNORMAL LOW (ref 98–111)
Creatinine, Ser: 8.72 mg/dL — ABNORMAL HIGH (ref 0.61–1.24)
GFR, Estimated: 7 mL/min — ABNORMAL LOW (ref >60)
Glucose, Bld: 87 mg/dL (ref 70–99)
Potassium: 5.1 mmol/L (ref 3.5–5.1)
Sodium: 134 mmol/L — ABNORMAL LOW (ref 135–145)
Total Bilirubin: 0.7 mg/dL (ref 0.3–1.2)
Total Protein: 6.7 g/dL (ref 6.5–8.1)

## 2020-07-21 LAB — PHOSPHORUS: Phosphorus: 8.2 mg/dL — ABNORMAL HIGH (ref 2.5–4.6)

## 2020-07-21 LAB — GLUCOSE, CAPILLARY
Glucose-Capillary: 83 mg/dL (ref 70–99)
Glucose-Capillary: 85 mg/dL (ref 70–99)

## 2020-07-21 LAB — C-REACTIVE PROTEIN: CRP: 3.5 mg/dL — ABNORMAL HIGH (ref <1.0)

## 2020-07-21 LAB — FERRITIN: Ferritin: 1848 ng/mL — ABNORMAL HIGH (ref 24–336)

## 2020-07-21 LAB — MAGNESIUM: Magnesium: 2.2 mg/dL (ref 1.7–2.4)

## 2020-07-21 LAB — D-DIMER, QUANTITATIVE: D-Dimer, Quant: 1.1 ug/mL-FEU — ABNORMAL HIGH (ref 0.00–0.50)

## 2020-07-21 MED ORDER — HYDROCOD POLST-CPM POLST ER 10-8 MG/5ML PO SUER
5.0000 mL | Freq: Once | ORAL | Status: AC
  Administered 2020-07-21: 5 mL via ORAL
  Filled 2020-07-21: qty 5

## 2020-07-21 MED ORDER — ALBUTEROL SULFATE HFA 108 (90 BASE) MCG/ACT IN AERS: 2.0000 | INHALATION_SPRAY | RESPIRATORY_TRACT | 1 refills | Status: AC | PRN

## 2020-07-21 MED ORDER — BUTALBITAL-APAP-CAFFEINE 50-325-40 MG PO TABS: 1.0000 | ORAL_TABLET | Freq: Four times a day (QID) | ORAL | 0 refills | Status: AC | PRN

## 2020-07-21 MED ORDER — DEXAMETHASONE 6 MG PO TABS: 6.0000 mg | ORAL_TABLET | Freq: Every day | ORAL | 0 refills | Status: AC

## 2020-07-21 NOTE — Discharge Summary (Signed)
Physician Discharge Summary  STEVIN PIECUCH [id number] DOB: [birth year] DOA: 07/20/2020  PCP: Monico Blitz, MD  Admit date: 07/20/2020  Discharge date: 07/21/2020  Admitted From:Home  Disposition:  Home  Recommendations for Outpatient Follow-up:  Follow up with PCP in 1-2 weeks Continue on dexamethasone as prescribed for several more days Inhaler provided as needed for shortness of breath or wheezing Continue home 2 L nasal cannula oxygen supplementation Continue hemodialysis per routine schedule MWF  Home Health: None  Equipment/Devices: Has chronic 2 L nasal cannula oxygen  Discharge Condition:Stable  CODE STATUS: Full  Diet recommendation: Heart Healthy/carb modified  Brief/Interim Summary: Brendan Campbell is a 50 y.o. male with medical history significant of ESRD anemia, anxiety, depression, PTSD, chronic systolic CHF, type II DM, hyperlipidemia, diabetic peripheral neuropathy, PVD who came to the emergency department with complaints of progressively worse dyspnea since yesterday evening associated with chest pressure, diaphoresis, LLE edema and orthopnea.  He had been admitted with acute hypoxemic respiratory failure in the setting of COVID-19 pneumonia.  He had been started on remdesivir and dexamethasone and had hemodialysis on 6/13 with significant improvement in his oxygen requirements back to baseline.  It is suspected that much of his symptoms were related to volume overload in the setting of acute on chronic combined systolic and diastolic CHF exacerbation and that he was mostly noted to be incidentally COVID-positive.  Regardless, treatment was initiated and he is back to his baseline level of functioning and is stable for discharge on his usual home 2 L nasal cannula oxygen.  He will continue on steroids as noted above for several more days.  No other acute events noted throughout the course of this admission.  Discharge Diagnoses:  Principal Problem:   Volume  overload Active Problems:   Type 2 diabetes mellitus with diabetic chronic kidney disease ([place])   Hyperlipidemia   Anemia due to end stage renal disease (HCC)   Hyperkalemia   ESRD on hemodialysis (HCC)   Chronic atrial fibrillation (HCC)   Acute on chronic combined systolic and diastolic CHF (congestive heart failure) (HCC)   Acute on chronic respiratory failure with hypoxia (HCC)   Acute hypoxemic respiratory failure due to COVID-19 [facility])  Principal discharge diagnosis: Acute on chronic hypoxemic respiratory failure-multifactorial with volume overload in the setting of acute on chronic combined heart failure exacerbation along with COVID-19 pneumonia.  Discharge Instructions  Discharge Instructions     Diet - low sodium heart healthy   Complete by: As directed    Increase activity slowly   Complete by: As directed       Allergies as of 07/21/2020       Reactions   Tape Other (See Comments)   Pulls skin off   Other         Medication List     TAKE these medications    albuterol 108 (90 Base) MCG/ACT inhaler Commonly known as: VENTOLIN HFA Inhale 2 puffs into the lungs every 4 (four) hours as needed for wheezing or shortness of breath.   apixaban 5 MG Tabs tablet Commonly known as: ELIQUIS Take 1 tablet (5 mg total) by mouth 2 (two) times daily.   aspirin 81 MG chewable tablet Chew 81 mg by mouth daily.   atorvastatin 10 MG tablet Commonly known as: LIPITOR Take 1 tablet (10 mg total) by mouth every evening.   butalbital-acetaminophen-caffeine 50-325-40 MG tablet Commonly known as: FIORICET Take 1 tablet by mouth every 6 (six) hours as needed for headache or  migraine.   calcium acetate 667 MG capsule Commonly known as: PHOSLO Take 1 capsule (667 mg total) by mouth 3 (three) times daily with meals.   calcium carbonate 500 MG chewable tablet Commonly known as: TUMS - dosed in mg elemental calcium Chew 4 tablets (800 mg of elemental calcium total) by mouth  2 (two) times daily.   carvedilol 3.125 MG tablet Commonly known as: Coreg Take 1 tablet (3.125 mg total) by mouth 2 (two) times daily with a meal.   dexamethasone 6 MG tablet Commonly known as: DECADRON Take 1 tablet (6 mg total) by mouth daily for 5 days.   DULoxetine 30 MG capsule Commonly known as: CYMBALTA Take 30 mg by mouth 2 (two) times daily.   Lantus SoloStar 100 UNIT/ML Solostar Pen Generic drug: insulin glargine Inject 10 Units into the skin at bedtime.   lidocaine-prilocaine cream Commonly known as: EMLA Apply 1 application topically See admin instructions. Applied three times weekly at dialysis on MWF   multivitamin Tabs tablet Take 1 tablet by mouth daily.   Veltassa 8.4 g packet Generic drug: patiromer Take 1 packet (8.4 g total) by mouth 3 (three) times a week. On Tuesday, Thursday and Saturday        Follow-up Information     Monico Blitz, MD Follow up in 1 week(s).   Specialty: Internal Medicine Contact information: [place] [address] [phone]                Allergies  Allergen Reactions   Tape Other (See Comments)    Pulls skin off   Other     Consultations: Nephrology   Procedures/Studies: DG Chest Portable 1 View  Result Date: 07/20/2020 CLINICAL DATA:  Chest pain, shortness of breath EXAM: PORTABLE CHEST 1 VIEW COMPARISON:  07/03/2020 FINDINGS: Heart is borderline in size. No confluent airspace opacities, effusions or edema. No acute bony abnormality. IMPRESSION: No active disease. Electronically Signed   By: Rolm Baptise M.D.   On: 07/20/2020 02:53   DG Chest Port 1 View  Result Date: 07/03/2020 CLINICAL DATA:  Shortness of breath EXAM: PORTABLE CHEST 1 VIEW COMPARISON:  04/08/2020 FINDINGS: Cardiomegaly. Diffuse interstitial opacity with Kerley lines and vascular pedicle widening. No visible effusion or pneumothorax. IMPRESSION: CHF. Electronically Signed   By: Monte Fantasia M.D.   On: 07/03/2020 06:17      Discharge Exam: Vitals:   07/20/20 2154 07/21/20 0541  BP: 133/73 (!) 161/90  Pulse: 91 86  Resp: 18 (!) 21  Temp: 97.6 F (36.4 C) 97.8 F (36.6 C)  SpO2: 100% 97%   Vitals:   07/20/20 1600 07/20/20 1615 07/20/20 2154 07/21/20 0541  BP: 120/68 136/72 133/73 (!) 161/90  Pulse: 96 95 91 86  Resp: '18 20 18 '$ (!) 21  Temp:   97.6 F (36.4 C) 97.8 F (36.6 C)  TempSrc:    Oral  SpO2:   100% 97%  Weight:      Height:        General: Pt is alert, awake, not in acute distress Cardiovascular: RRR, S1/S2 +, no rubs, no gallops Respiratory: CTA bilaterally, no wheezing, no rhonchi, currently on 2 L nasal cannula oxygen Abdominal: Soft, NT, ND, bowel sounds + Extremities: no edema, no cyanosis    The results of significant diagnostics from this hospitalization (including imaging, microbiology, ancillary and laboratory) are listed below for reference.     Microbiology: Recent Results (from the past 240 hour(s))  Resp Panel by RT-PCR (Flu  A&B, Covid) Nasopharyngeal Swab     Status: Abnormal   Collection Time: 07/20/20  4:25 AM   Specimen: Nasopharyngeal Swab; Nasopharyngeal(NP) swabs in vial transport medium  Result Value Ref Range Status   SARS Coronavirus 2 by RT PCR POSITIVE (A) NEGATIVE Final    Comment: RESULT CALLED TO, READ BACK BY AND VERIFIED WITH: Sappelt,J '@0605'$  by MATTHEWS,B. 6.13.22 (NOTE) SARS-CoV-2 target nucleic acids are DETECTED.  The SARS-CoV-2 RNA is generally detectable in upper respiratory specimens during the acute phase of infection. Positive results are indicative of the presence of the identified virus, but do not rule out bacterial infection or co-infection with other pathogens not detected by the test. Clinical correlation with patient history and other diagnostic information is necessary to determine patient infection status. The expected result is Negative.  Fact Sheet for Patients: EntrepreneurPulse.com.au  Fact Sheet  for Healthcare Providers: IncredibleEmployment.be  This test is not yet approved or cleared by the [place] FDA and  has been authorized for detection and/or diagnosis of SARS-CoV-2 by FDA under an Emergency Use Authorization (EUA).  This EUA will remain in effect (meaning this test c an be used) for the duration of  the COVID-19 declaration under Section 564(b)(1) of the Act, 21 U.S.C. section 360bbb-3(b)(1), unless the authorization is terminated or revoked sooner.     Influenza A by PCR NEGATIVE NEGATIVE Final   Influenza B by PCR NEGATIVE NEGATIVE Final    Comment: (NOTE) The Xpert Xpress SARS-CoV-2/FLU/RSV plus assay is intended as an aid in the diagnosis of influenza from Nasopharyngeal swab specimens and should not be used as a sole basis for treatment. Nasal washings and aspirates are unacceptable for Xpert Xpress SARS-CoV-2/FLU/RSV testing.  Fact Sheet for Patients: EntrepreneurPulse.com.au  Fact Sheet for Healthcare Providers: IncredibleEmployment.be  This test is not yet approved or cleared by the [place] FDA and has been authorized for detection and/or diagnosis of SARS-CoV-2 by FDA under an Emergency Use Authorization (EUA). This EUA will remain in effect (meaning this test can be used) for the duration of the COVID-19 declaration under Section 564(b)(1) of the Act, 21 U.S.C. section 360bbb-3(b)(1), unless the authorization is terminated or revoked.  Performed at [facility], [address] [place], [place] [address]      Labs: BNP (last 3 results) Recent Labs    04/08/20 0145 07/03/20 0618 07/20/20 0248  BNP 1,971.0* 2,185.0* 123XX123*   Basic Metabolic Panel: Recent Labs  Lab 07/20/20 0248 07/21/20 0616  NA 137 134*  K 6.1* 5.1  CL 99 95*  CO2 26 25  GLUCOSE 103* 87  BUN 45* 45*  CREATININE 10.63* 8.72*  CALCIUM 8.3* 8.4*  MG  --  2.2  PHOS  --  8.2*   Liver Function  Tests: Recent Labs  Lab 07/20/20 0248 07/21/20 0616  AST 15 20  ALT 11 14  ALKPHOS 69 60  BILITOT 0.7 0.7  PROT 7.5 6.7  ALBUMIN 3.9 3.5   No results for input(s): LIPASE, AMYLASE in the last 168 hours. No results for input(s): AMMONIA in the last 168 hours. CBC: Recent Labs  Lab 07/20/20 0248 07/21/20 0616  WBC 10.2 6.4  NEUTROABS 8.4* 5.1  HGB 10.4* 10.1*  HCT 32.7* 31.3*  MCV 106.2* 105.0*  PLT 261 188   Cardiac Enzymes: No results for input(s): CKTOTAL, CKMB, CKMBINDEX, TROPONINI in the last 168 hours. BNP: Invalid input(s): POCBNP CBG: Recent Labs  Lab 07/20/20 0850 07/20/20 1609 07/20/20 2108 07/21/20 0722  GLUCAP 104* 106*  135* 83   D-Dimer Recent Labs    07/20/20 0248 07/21/20 0616  DDIMER 0.83* 1.10*   Hgb A1c No results for input(s): HGBA1C in the last 72 hours. Lipid Profile No results for input(s): CHOL, HDL, LDLCALC, TRIG, CHOLHDL, LDLDIRECT in the last 72 hours. Thyroid function studies No results for input(s): TSH, T4TOTAL, T3FREE, THYROIDAB in the last 72 hours.  Invalid input(s): FREET3 Anemia work up Recent Labs    07/20/20 0248 07/21/20 0532  FERRITIN 1,303* 1,848*   Urinalysis No results found for: COLORURINE, APPEARANCEUR, LABSPEC, [place], GLUCOSEU, [place], [place], [place], PROTEINUR, UROBILINOGEN, NITRITE, LEUKOCYTESUR Sepsis Labs Invalid input(s): PROCALCITONIN,  WBC,  LACTICIDVEN Microbiology Recent Results (from the past 240 hour(s))  Resp Panel by RT-PCR (Flu A&B, Covid) Nasopharyngeal Swab     Status: Abnormal   Collection Time: 07/20/20  4:25 AM   Specimen: Nasopharyngeal Swab; Nasopharyngeal(NP) swabs in vial transport medium  Result Value Ref Range Status   SARS Coronavirus 2 by RT PCR POSITIVE (A) NEGATIVE Final    Comment: RESULT CALLED TO, READ BACK BY AND VERIFIED WITH: Sappelt,J '@0605'$  by MATTHEWS,B. 6.13.22 (NOTE) SARS-CoV-2 target nucleic acids are DETECTED.  The SARS-CoV-2 RNA is generally  detectable in upper respiratory specimens during the acute phase of infection. Positive results are indicative of the presence of the identified virus, but do not rule out bacterial infection or co-infection with other pathogens not detected by the test. Clinical correlation with patient history and other diagnostic information is necessary to determine patient infection status. The expected result is Negative.  Fact Sheet for Patients: EntrepreneurPulse.com.au  Fact Sheet for Healthcare Providers: IncredibleEmployment.be  This test is not yet approved or cleared by the [place] FDA and  has been authorized for detection and/or diagnosis of SARS-CoV-2 by FDA under an Emergency Use Authorization (EUA).  This EUA will remain in effect (meaning this test c an be used) for the duration of  the COVID-19 declaration under Section 564(b)(1) of the Act, 21 U.S.C. section 360bbb-3(b)(1), unless the authorization is terminated or revoked sooner.     Influenza A by PCR NEGATIVE NEGATIVE Final   Influenza B by PCR NEGATIVE NEGATIVE Final    Comment: (NOTE) The Xpert Xpress SARS-CoV-2/FLU/RSV plus assay is intended as an aid in the diagnosis of influenza from Nasopharyngeal swab specimens and should not be used as a sole basis for treatment. Nasal washings and aspirates are unacceptable for Xpert Xpress SARS-CoV-2/FLU/RSV testing.  Fact Sheet for Patients: EntrepreneurPulse.com.au  Fact Sheet for Healthcare Providers: IncredibleEmployment.be  This test is not yet approved or cleared by the [place] FDA and has been authorized for detection and/or diagnosis of SARS-CoV-2 by FDA under an Emergency Use Authorization (EUA). This EUA will remain in effect (meaning this test can be used) for the duration of the COVID-19 declaration under Section 564(b)(1) of the Act, 21 U.S.C. section 360bbb-3(b)(1), unless the  authorization is terminated or revoked.  Performed at [facility], [address], [place], [place] [address]      Time coordinating discharge: 35 minutes  SIGNED:   Rodena Goldmann, DO [facility] 07/21/2020, 10:05 AM  If 7PM-7AM, please contact night-coverage [email or web address]

## 2020-07-21 NOTE — Progress Notes (Signed)
Patient ID: Brendan Campbell, male   DOB: Apr 16, 1970, 50 y.o.   MRN: [id number] S: Signed off of dialysis early yesterday.  SOB and oxygen requirements did improve. O:BP (!) 161/90 (BP Location: Left Arm)   Pulse 86   Temp 97.8 F (36.6 C) (Oral)   Resp (!) 21   Ht 6' (1.829 m)   Wt 101.1 kg   SpO2 97%   BMI 30.23 kg/m   Intake/Output Summary (Last 24 hours) at 07/21/2020 1208 Last data filed at 07/21/2020 0800 Gross per 24 hour  Intake 840 ml  Output 3223 ml  Net -2383 ml   Intake/Output: I/O last 3 completed shifts: In: 560 [P.O.:360; IV Piggyback:200] Out: 3223 [Urine:275; Other:2948]  Intake/Output this shift:  Total I/O In: 480 [P.O.:480] Out: -  Weight change:  Physical exam: unable to complete due to COVID + status.  In order to preserve PPE equipment and to minimize exposure to providers.  Notes from other caregivers reviewed   Recent Labs  Lab 07/20/20 0248 07/21/20 0616  NA 137 134*  K 6.1* 5.1  CL 99 95*  CO2 26 25  GLUCOSE 103* 87  BUN 45* 45*  CREATININE 10.63* 8.72*  ALBUMIN 3.9 3.5  CALCIUM 8.3* 8.4*  PHOS  --  8.2*  AST 15 20  ALT 11 14   Liver Function Tests: Recent Labs  Lab 07/20/20 0248 07/21/20 0616  AST 15 20  ALT 11 14  ALKPHOS 69 60  BILITOT 0.7 0.7  PROT 7.5 6.7  ALBUMIN 3.9 3.5   No results for input(s): LIPASE, AMYLASE in the last 168 hours. No results for input(s): AMMONIA in the last 168 hours. CBC: Recent Labs  Lab 07/20/20 0248 07/21/20 0616  WBC 10.2 6.4  NEUTROABS 8.4* 5.1  HGB 10.4* 10.1*  HCT 32.7* 31.3*  MCV 106.2* 105.0*  PLT 261 188   Cardiac Enzymes: No results for input(s): CKTOTAL, CKMB, CKMBINDEX, TROPONINI in the last 168 hours. CBG: Recent Labs  Lab 07/20/20 0850 07/20/20 1609 07/20/20 2108 07/21/20 0722 07/21/20 1123  GLUCAP 104* 106* 135* 83 85    Iron Studies:  Recent Labs    07/21/20 0532  FERRITIN 1,848*   Studies/Results: DG Chest Portable 1 View  Result Date:  07/20/2020 CLINICAL DATA:  Chest pain, shortness of breath EXAM: PORTABLE CHEST 1 VIEW COMPARISON:  07/03/2020 FINDINGS: Heart is borderline in size. No confluent airspace opacities, effusions or edema. No acute bony abnormality. IMPRESSION: No active disease. Electronically Signed   By: Rolm Baptise M.D.   On: 07/20/2020 02:53    apixaban  5 mg Oral BID   aspirin  81 mg Oral Daily   atorvastatin  10 mg Oral QHS   calcium acetate  667 mg Oral TID WC   calcium carbonate  800 mg of elemental calcium Oral BID   Chlorhexidine Gluconate Cloth  6 each Topical Q0600   dexamethasone (DECADRON) injection  6 mg Intravenous Q24H   DULoxetine  30 mg Oral BID   insulin aspart  0-9 Units Subcutaneous TID WC   insulin detemir  0.075 Units/kg Subcutaneous BID   linagliptin  5 mg Oral Daily   multivitamin  1 tablet Oral Daily    BMET    Component Value Date/Time   NA 134 (L) 07/21/2020 0616   NA 142 12/16/2015 1051   K 5.1 07/21/2020 0616   CL 95 (L) 07/21/2020 0616   CO2 25 07/21/2020 0616   GLUCOSE 87 07/21/2020 [id number]  BUN 45 (H) 07/21/2020 0616   BUN 45 (H) 12/16/2015 1051   CREATININE 8.72 (H) 07/21/2020 0616   CALCIUM 8.4 (L) 07/21/2020 0616   GFRNONAA 7 (L) 07/21/2020 0616   GFRAA 9 (L) 09/27/2019 0951   CBC    Component Value Date/Time   WBC 6.4 07/21/2020 0616   RBC 2.98 (L) 07/21/2020 0616   HGB 10.1 (L) 07/21/2020 0616   HGB 10.4 (L) 06/02/2016 1107   HCT 31.3 (L) 07/21/2020 0616   HCT 32.9 (L) 06/02/2016 1107   PLT 188 07/21/2020 0616   PLT 389 (H) 06/02/2016 1107   MCV 105.0 (H) 07/21/2020 0616   MCV 94 06/02/2016 1107   MCH 33.9 07/21/2020 0616   MCHC 32.3 07/21/2020 0616   RDW 12.8 07/21/2020 0616   RDW 16.0 (H) 06/02/2016 1107   LYMPHSABS 0.6 (L) 07/21/2020 0616   LYMPHSABS 2.9 06/02/2016 1107   MONOABS 0.6 07/21/2020 0616   EOSABS 0.0 07/21/2020 0616   EOSABS 0.5 (H) 06/02/2016 1107   BASOSABS 0.0 07/21/2020 0616   BASOSABS 0.1 06/02/2016 1107    Dialysis  Access: LUE AVG   Dialysis:  [facility]  on MWF  4h 81mn  98 kg   1K/2.25 bath  Hep 1000+ 500u/hr  LUE AVG  300/500   - prosthetic weighs 1.6 kg  - Epogen 1000   Units IV/HD     Assessment/Plan:  Acute hypoxic respiratory failure - in setting of covid-19.  Remdesivir per primary as well as dexamethasone.  Markedly improved with HD and UF so likely due to pulmonary edema due to noncompliance with HD.  ESRD -  continue with HD on MWF schedule  Hypertension/volume  - UF as tolerated  Anemia  - continue with ESA  Metabolic bone disease -  continue with outpatient meds  Nutrition - renal diet Hyperkalemia - follow after HD.  Disposition - for discharge home and will need to f/u with his home HD unit tomorrow.   JDonetta Potts MD [employer]([phone]

## 2020-07-21 NOTE — Progress Notes (Signed)
Nsg Discharge Note  Admit Date:  07/20/2020 Discharge date: 07/21/2020   [name] to be D/C'd Home per MD order.  AVS completed.   Patient/caregiver able to verbalize understanding.  Discharge Medication: Allergies as of 07/21/2020       Reactions   Tape Other (See Comments)   Pulls skin off   Other         Medication List     TAKE these medications    albuterol 108 (90 Base) MCG/ACT inhaler Commonly known as: VENTOLIN HFA Inhale 2 puffs into the lungs every 4 (four) hours as needed for wheezing or shortness of breath.   apixaban 5 MG Tabs tablet Commonly known as: ELIQUIS Take 1 tablet (5 mg total) by mouth 2 (two) times daily.   aspirin 81 MG chewable tablet Chew 81 mg by mouth daily.   atorvastatin 10 MG tablet Commonly known as: LIPITOR Take 1 tablet (10 mg total) by mouth every evening.   butalbital-acetaminophen-caffeine 50-325-40 MG tablet Commonly known as: FIORICET Take 1 tablet by mouth every 6 (six) hours as needed for headache or migraine.   calcium acetate 667 MG capsule Commonly known as: PHOSLO Take 1 capsule (667 mg total) by mouth 3 (three) times daily with meals.   calcium carbonate 500 MG chewable tablet Commonly known as: TUMS - dosed in mg elemental calcium Chew 4 tablets (800 mg of elemental calcium total) by mouth 2 (two) times daily.   carvedilol 3.125 MG tablet Commonly known as: Coreg Take 1 tablet (3.125 mg total) by mouth 2 (two) times daily with a meal.   dexamethasone 6 MG tablet Commonly known as: DECADRON Take 1 tablet (6 mg total) by mouth daily for 5 days.   DULoxetine 30 MG capsule Commonly known as: CYMBALTA Take 30 mg by mouth 2 (two) times daily.   Lantus SoloStar 100 UNIT/ML Solostar Pen Generic drug: insulin glargine Inject 10 Units into the skin at bedtime.   lidocaine-prilocaine cream Commonly known as: EMLA Apply 1 application topically See admin instructions. Applied three times weekly at dialysis on  MWF   multivitamin Tabs tablet Take 1 tablet by mouth daily.   Veltassa 8.4 g packet Generic drug: patiromer Take 1 packet (8.4 g total) by mouth 3 (three) times a week. On Tuesday, Thursday and Saturday        Discharge Assessment: Vitals:   07/20/20 2154 07/21/20 0541  BP: 133/73 (!) 161/90  Pulse: 91 86  Resp: 18 (!) 21  Temp: 97.6 F (36.4 C) 97.8 F (36.6 C)  SpO2: 100% 97%   Skin clean, dry and intact without evidence of skin break down, no evidence of skin tears noted. IV catheter discontinued intact. Site without signs and symptoms of complications - no redness or edema noted at insertion site, patient denies c/o pain - only slight tenderness at site.  Dressing with slight pressure applied.  D/c Instructions-Education: Discharge instructions given to patient with verbalized understanding. D/c education completed with patient including follow up instructions, medication list, d/c activities limitations if indicated, with other d/c instructions as indicated by MD - patient able to verbalize understanding, all questions fully answered. Patient instructed to return to ED, call 911, or call MD for any changes in condition.  Patient escorted via [place], and D/C home via private auto.  Berton Bon, RN 07/21/2020 12:08 PM

## 2020-07-21 NOTE — TOC Transition Note (Signed)
Transition of Care [facility]) - CM/SW Discharge Note   Patient Details  Name: TMOTHY PAPALEO MRN: [id number] Date of Birth: 1970-12-06  [facility]) CM/SW Contact:  Boneta Lucks, RN Phone Number: 07/21/2020, 11:13 AM   Clinical Narrative:   Patient medically ready to discharge home, COVID positive, Patient needing ride home. [facility] transport can not transport him home, TOC call EMS. Medical necessity completed.   Final next level of care: Home/Self Care Barriers to Discharge: Barriers Resolved   Patient Goals and CMS Choice Patient states their goals for this hospitalization and ongoing recovery are:: to go home. CMS Medicare.gov Compare Post Acute Care list provided to:: Patient Choice offered to / list presented to : Patient  Discharge Placement                Patient to be transferred to facility by: EMS   Patient and family notified of of transfer: 07/21/20  Discharge Plan and Services               DME Arranged: Oxygen     Readmission Risk Interventions Readmission Risk Prevention Plan 07/21/2020  Transportation Screening Complete  PCP or Specialist Appt within 3-5 Days Complete  HRI or Home Care Consult Complete  Social Work Consult for [place] Planning/Counseling Complete  Palliative Care Screening Not Applicable  Medication Review [employer]) Complete  Some recent data might be hidden

## 2020-07-27 ENCOUNTER — Observation Stay (HOSPITAL_COMMUNITY)
Admission: EM | Admit: 2020-07-27 | Discharge: 2020-07-28 | Disposition: A | Discharge status: Home / Self Care | Payer: Medicaid Other | Attending: Internal Medicine | Admitting: Internal Medicine

## 2020-07-27 ENCOUNTER — Encounter (HOSPITAL_COMMUNITY): Payer: Self-pay | Admitting: *Deleted

## 2020-07-27 ENCOUNTER — Emergency Department (HOSPITAL_COMMUNITY): Payer: Medicaid Other

## 2020-07-27 DIAGNOSIS — Z992 Dependence on renal dialysis: Secondary | ICD-10-CM | POA: Insufficient documentation

## 2020-07-27 DIAGNOSIS — I5043 Acute on chronic combined systolic (congestive) and diastolic (congestive) heart failure: Secondary | ICD-10-CM | POA: Diagnosis not present

## 2020-07-27 DIAGNOSIS — Z794 Long term (current) use of insulin: Secondary | ICD-10-CM | POA: Insufficient documentation

## 2020-07-27 DIAGNOSIS — Z7901 Long term (current) use of anticoagulants: Secondary | ICD-10-CM | POA: Diagnosis not present

## 2020-07-27 DIAGNOSIS — N186 End stage renal disease: Secondary | ICD-10-CM | POA: Diagnosis not present

## 2020-07-27 DIAGNOSIS — E785 Hyperlipidemia, unspecified: Secondary | ICD-10-CM | POA: Diagnosis present

## 2020-07-27 DIAGNOSIS — D539 Nutritional anemia, unspecified: Secondary | ICD-10-CM | POA: Diagnosis present

## 2020-07-27 DIAGNOSIS — R0602 Shortness of breath: Secondary | ICD-10-CM | POA: Insufficient documentation

## 2020-07-27 DIAGNOSIS — Z7982 Long term (current) use of aspirin: Secondary | ICD-10-CM | POA: Insufficient documentation

## 2020-07-27 DIAGNOSIS — I5022 Chronic systolic (congestive) heart failure: Secondary | ICD-10-CM

## 2020-07-27 DIAGNOSIS — U071 COVID-19: Secondary | ICD-10-CM | POA: Diagnosis not present

## 2020-07-27 DIAGNOSIS — F17210 Nicotine dependence, cigarettes, uncomplicated: Secondary | ICD-10-CM | POA: Insufficient documentation

## 2020-07-27 DIAGNOSIS — E875 Hyperkalemia: Secondary | ICD-10-CM | POA: Diagnosis present

## 2020-07-27 DIAGNOSIS — Z28310 Unvaccinated for covid-19: Secondary | ICD-10-CM | POA: Diagnosis not present

## 2020-07-27 DIAGNOSIS — E1122 Type 2 diabetes mellitus with diabetic chronic kidney disease: Secondary | ICD-10-CM | POA: Diagnosis not present

## 2020-07-27 DIAGNOSIS — I4891 Unspecified atrial fibrillation: Secondary | ICD-10-CM | POA: Diagnosis not present

## 2020-07-27 DIAGNOSIS — R110 Nausea: Secondary | ICD-10-CM | POA: Diagnosis present

## 2020-07-27 DIAGNOSIS — R9431 Abnormal electrocardiogram [ECG] [EKG]: Secondary | ICD-10-CM

## 2020-07-27 DIAGNOSIS — E782 Mixed hyperlipidemia: Secondary | ICD-10-CM | POA: Diagnosis not present

## 2020-07-27 DIAGNOSIS — I4581 Long QT syndrome: Secondary | ICD-10-CM | POA: Diagnosis not present

## 2020-07-27 LAB — CBC WITH DIFFERENTIAL/PLATELET
Abs Immature Granulocytes: 0.08 10*3/uL — ABNORMAL HIGH (ref 0.00–0.07)
Basophils Absolute: 0 10*3/uL (ref 0.0–0.1)
Basophils Relative: 0 %
Eosinophils Absolute: 0.3 10*3/uL (ref 0.0–0.5)
Eosinophils Relative: 3 %
HCT: 28 % — ABNORMAL LOW (ref 39.0–52.0)
Hemoglobin: 9 g/dL — ABNORMAL LOW (ref 13.0–17.0)
Immature Granulocytes: 1 %
Lymphocytes Relative: 17 %
Lymphs Abs: 1.5 10*3/uL (ref 0.7–4.0)
MCH: 33.6 pg (ref 26.0–34.0)
MCHC: 32.1 g/dL (ref 30.0–36.0)
MCV: 104.5 fL — ABNORMAL HIGH (ref 80.0–100.0)
Monocytes Absolute: 0.6 10*3/uL (ref 0.1–1.0)
Monocytes Relative: 6 %
Neutro Abs: 6.7 10*3/uL (ref 1.7–7.7)
Neutrophils Relative %: 73 %
Platelets: 203 10*3/uL (ref 150–400)
RBC: 2.68 MIL/uL — ABNORMAL LOW (ref 4.22–5.81)
RDW: 13 % (ref 11.5–15.5)
WBC: 9.1 10*3/uL (ref 4.0–10.5)
nRBC: 0 % (ref 0.0–0.2)

## 2020-07-27 LAB — COMPREHENSIVE METABOLIC PANEL
ALT: 16 U/L (ref 0–44)
AST: 20 U/L (ref 15–41)
Albumin: 3.5 g/dL (ref 3.5–5.0)
Alkaline Phosphatase: 59 U/L (ref 38–126)
Anion gap: 18 — ABNORMAL HIGH (ref 5–15)
BUN: 86 mg/dL — ABNORMAL HIGH (ref 6–20)
CO2: 21 mmol/L — ABNORMAL LOW (ref 22–32)
Calcium: 6.6 mg/dL — ABNORMAL LOW (ref 8.9–10.3)
Chloride: 98 mmol/L (ref 98–111)
Creatinine, Ser: 13.13 mg/dL — ABNORMAL HIGH (ref 0.61–1.24)
GFR, Estimated: 4 mL/min — ABNORMAL LOW (ref >60)
Glucose, Bld: 87 mg/dL (ref 70–99)
Potassium: 5.8 mmol/L — ABNORMAL HIGH (ref 3.5–5.1)
Sodium: 137 mmol/L (ref 135–145)
Total Bilirubin: 0.7 mg/dL (ref 0.3–1.2)
Total Protein: 6.6 g/dL (ref 6.5–8.1)

## 2020-07-27 LAB — BRAIN NATRIURETIC PEPTIDE: B Natriuretic Peptide: 2631 pg/mL — ABNORMAL HIGH (ref 0.0–100.0)

## 2020-07-27 LAB — MAGNESIUM: Magnesium: 2.3 mg/dL (ref 1.7–2.4)

## 2020-07-27 MED ORDER — CALCIUM ACETATE (PHOS BINDER) 667 MG PO CAPS
667.0000 mg | ORAL_CAPSULE | Freq: Three times a day (TID) | ORAL | Status: DC
  Administered 2020-07-28: 667 mg via ORAL
  Filled 2020-07-27 (×2): qty 1

## 2020-07-27 MED ORDER — HEPARIN SODIUM (PORCINE) 5000 UNIT/ML IJ SOLN: 5000.0000 [IU] | Freq: Three times a day (TID) | INTRAMUSCULAR | Status: DC

## 2020-07-27 MED ORDER — CALCIUM CARBONATE ANTACID 500 MG PO CHEW
800.0000 mg | CHEWABLE_TABLET | Freq: Two times a day (BID) | ORAL | Status: DC
  Administered 2020-07-28: 800 mg via ORAL
  Filled 2020-07-27: qty 4

## 2020-07-27 MED ORDER — ATORVASTATIN CALCIUM 10 MG PO TABS: 10.0000 mg | ORAL_TABLET | Freq: Every evening | ORAL | Status: DC

## 2020-07-27 MED ORDER — CARVEDILOL 3.125 MG PO TABS
3.1250 mg | ORAL_TABLET | Freq: Two times a day (BID) | ORAL | Status: DC
  Administered 2020-07-28: 3.125 mg via ORAL
  Filled 2020-07-27: qty 1

## 2020-07-27 MED ORDER — CALCIUM GLUCONATE-NACL 2-0.675 GM/100ML-% IV SOLN
2.0000 g | Freq: Once | INTRAVENOUS | Status: AC
  Administered 2020-07-27: 2000 mg via INTRAVENOUS
  Filled 2020-07-27: qty 100

## 2020-07-27 MED ORDER — SODIUM ZIRCONIUM CYCLOSILICATE 5 G PO PACK
10.0000 g | PACK | Freq: Once | ORAL | Status: AC
  Administered 2020-07-27: 10 g via ORAL
  Filled 2020-07-27: qty 2

## 2020-07-27 MED ORDER — ASPIRIN 81 MG PO CHEW
81.0000 mg | CHEWABLE_TABLET | Freq: Every day | ORAL | Status: DC
  Administered 2020-07-28: 81 mg via ORAL
  Filled 2020-07-27: qty 1

## 2020-07-27 MED ORDER — APIXABAN 5 MG PO TABS
5.0000 mg | ORAL_TABLET | Freq: Two times a day (BID) | ORAL | Status: DC
  Administered 2020-07-28: 5 mg via ORAL
  Filled 2020-07-27: qty 1

## 2020-07-27 NOTE — ED Provider Notes (Signed)
Angiocath insertion Performed by: Ephraim Hamburger  Consent: Verbal consent obtained. Risks and benefits: risks, benefits and alternatives were discussed Time out: Immediately prior to procedure a "time out" was called to verify the correct patient, procedure, equipment, support staff and site/side marked as required.  Preparation: Patient was prepped and draped in the usual sterile fashion.  Vein Location: right basilic  Ultrasound Guided  Gauge: 20  Normal blood return and flush without difficulty Patient tolerance: Patient tolerated the procedure well with no immediate complications.  Asked by nursing staff to help get IV due to very difficult access.  IV obtained as above.     Sherwood Gambler, MD 07/27/20 2141

## 2020-07-27 NOTE — H&P (Signed)
History and Physical  Brendan Campbell [id number] DOB: 31-May-1970 DOA: 07/27/2020  Referring physician: Aron Baba  PCP: Monico Blitz, MD  Patient coming from: Home  Chief Complaint: Maintenance dialysis  HPI: Brendan Campbell is a 50 y.o. male with medical history significant for  ESRD on HD (MWF), anemia, anxiety, depression, PTSD, chronic systolic CHF on 2 LPM of oxygen at baseline, type II DM, hyperlipidemia, diabetic peripheral neuropathy, PVD who presents to the emergency department for maintenance dialysis.  Patient states that he was supposed to get his dialysis treatment today, but his transportation refused to take him for his dialysis treatment due to being recently diagnosed to have COVID-19 virus infection (6/13), so he called dialysis center and was asked to go to the ED for hemodialysis.  Last dialysis was on Friday (6/17), he complained of nausea and some shortness of breath.  He was admitted from 6/13-6/14 due to acute respiratory failure with hypoxia in the setting of COVID-19 pneumonia during which she was treated with remdesivir and dexamethasone and he had dialysis on 6/30 with significant improvement in oxygen requirement back to baseline.  He endorsed some nausea, but denied chest pain, worsening shortness of breath, fever, chills, vomiting or abdominal pain  ED Course:  In the emergency department, he was hemodynamically stable.  Work-up in the ED showed macrocytic anemia, hyperkalemia, BUN/creatinine 86/13.13, hypocalcemia. Chest x-ray showed no acute cardiopulmonary disease Nephrologist was called and recommended calcium gluconate and Lokelma with plan to dialyze patient in the morning.  Hospitalist was asked to admit patient for further evaluation and management.   Review of Systems: Constitutional: Negative for chills and fever.  HENT: Negative for ear pain and sore throat.   Eyes: Negative for pain and visual disturbance.  Respiratory: Positive for  shortness of breath (chronic).  Negative for cough, chest tightness Cardiovascular: Negative for chest pain and palpitations.  Gastrointestinal: Positive for nausea.  Negative for abdominal pain and vomiting.  Endocrine: Negative for polyphagia and polyuria.  Genitourinary: Negative for decreased urine volume, dysuria, enuresis Musculoskeletal: Negative for arthralgias and back pain.  Skin: Negative for color change and rash.  Allergic/Immunologic: Negative for immunocompromised state.  Neurological: Negative for tremors, syncope, speech difficulty, weakness, light-headedness and headaches.  Hematological: Does not bruise/bleed easily.  All other systems reviewed and are negative  Past Medical History:  Diagnosis Date   Anemia    Anxiety    Blood transfusion without reported diagnosis    CHF (congestive heart failure) (HCC)    a. EF 25% by echo in 07/2019   Chronic kidney disease    Depression    Diabetes mellitus without complication ([place])    End stage renal disease ([place])    M/W/F [facility]   Hyperlipidemia    Neuropathy    Peripheral vascular disease (HCC)    PTSD (post-traumatic stress disorder)    Past Surgical History:  Procedure Laterality Date   A/V FISTULAGRAM N/A 10/09/2018   Procedure: A/V FISTULAGRAM;  Surgeon: Serafina Mitchell, MD;  Location: [facility];  Service: Cardiovascular;  Laterality: N/A;   AV FISTULA PLACEMENT Left 09/22/2016   Procedure: CREATION OF LEFT ARM ARTERIOVENOUS (AV) FISTULA;  Surgeon: Waynetta Sandy, MD;  Location: [facility];  Service: Vascular;  Laterality: Left;   AV FISTULA PLACEMENT Left 10/31/2017   Procedure: INSERTION OF ARTERIOVENOUS (AV) GORE-TEX 4-7m STETCH GRAFT LEFT ARM;  Surgeon: CWaynetta Sandy MD;  Location: [facility]  Service: Vascular;  Laterality: Left;  BASCILIC VEIN TRANSPOSITION Left 08/17/2017   Procedure: SECOND STAGE BASILIC VEIN TRANSPOSITION LEFT ARM;  Surgeon: Waynetta Sandy, MD;   Location: [facility];  Service: Vascular;  Laterality: Left;   BELOW KNEE LEG AMPUTATION Right    CARDIOVERSION N/A 02/19/2019   Procedure: CARDIOVERSION;  Surgeon: [name], MD;  Location: [facility];  Service: Cardiovascular;  Laterality: N/A;   CHOLECYSTECTOMY     FOOT SURGERY     IR FLUORO GUIDE CV LINE RIGHT  05/16/2016   IR FLUORO GUIDE CV LINE RIGHT  02/16/2019   IR REMOVAL TUN CV CATH W/O FL  05/16/2016   IR REMOVAL TUN CV CATH W/O FL  02/19/2019   IR THROMBECTOMY AV FISTULA W/THROMBOLYSIS/PTA INC/SHUNT/IMG LEFT Left 11/09/2018   IR THROMBECTOMY AV FISTULA W/THROMBOLYSIS/PTA INC/SHUNT/IMG LEFT Left 06/22/2019   IR US GUIDE VASC ACCESS LEFT  11/09/2018   IR US GUIDE VASC ACCESS LEFT  06/22/2019   IR US GUIDE VASC ACCESS RIGHT  05/16/2016   IR US GUIDE VASC ACCESS RIGHT  02/16/2019   PERIPHERAL VASCULAR BALLOON ANGIOPLASTY  10/09/2018   Procedure: PERIPHERAL VASCULAR BALLOON ANGIOPLASTY;  Surgeon: Serafina Mitchell, MD;  Location: [facility];  Service: Cardiovascular;;   TEE WITHOUT CARDIOVERSION N/A 02/19/2019   Procedure: TRANSESOPHAGEAL ECHOCARDIOGRAM (TEE);  Surgeon: [name], MD;  Location: [facility];  Service: Cardiovascular;  Laterality: N/A;    Social History:  reports that he has been smoking. He has never used smokeless tobacco. He reports that he does not drink alcohol and does not use drugs.   Allergies  Allergen Reactions   Tape Other (See Comments)    Pulls skin off   Other     Family History  Problem Relation Age of Onset   Cancer Mother        lung   Diabetes Mother    Heart attack Father    Diabetes Father    Diabetes Sister      Prior to Admission medications   Medication Sig Start Date End Date Taking? Authorizing Provider  albuterol (VENTOLIN HFA) 108 (90 Base) MCG/ACT inhaler Inhale 2 puffs into the lungs every 4 (four) hours as needed for wheezing or shortness of breath. 07/21/20   Manuella Ghazi, Pratik D, DO  apixaban (ELIQUIS) 5 MG TABS tablet Take  1 tablet (5 mg total) by mouth 2 (two) times daily. 08/23/19   Strader, Fransisco Hertz, PA-C  aspirin 81 MG chewable tablet Chew 81 mg by mouth daily.    [provider]  atorvastatin (LIPITOR) 10 MG tablet Take 1 tablet (10 mg total) by mouth every evening. 08/03/19   Barton Dubois, MD  butalbital-acetaminophen-caffeine (FIORICET) [phone] MG tablet Take 1 tablet by mouth every 6 (six) hours as needed for headache or migraine. 07/21/20   Manuella Ghazi, Pratik D, DO  calcium acetate (PHOSLO) 667 MG capsule Take 1 capsule (667 mg total) by mouth 3 (three) times daily with meals. 02/21/19   Donnamae Jude, MD  calcium carbonate (TUMS - DOSED IN MG ELEMENTAL CALCIUM) 500 MG chewable tablet Chew 4 tablets (800 mg of elemental calcium total) by mouth 2 (two) times daily. 08/03/19   Barton Dubois, MD  carvedilol (COREG) 3.125 MG tablet Take 1 tablet (3.125 mg total) by mouth 2 (two) times daily with a meal. 04/09/20 07/21/20  Shahmehdi, Valeria Batman, MD  DULoxetine (CYMBALTA) 30 MG capsule Take 30 mg by mouth 2 (two) times daily. 12/26/19   [provider]  LANTUS SOLOSTAR 100  UNIT/ML Solostar Pen Inject 10 Units into the skin at bedtime.  01/18/19   [provider]  lidocaine-prilocaine (EMLA) cream Apply 1 application topically See admin instructions. Applied three times weekly at dialysis on MWF 05/06/19   [provider]  multivitamin (RENA-VIT) TABS tablet Take 1 tablet by mouth daily. 08/04/18   [provider]  patiromer (VELTASSA) 8.4 g packet Take 1 packet (8.4 g total) by mouth 3 (three) times a week. On Tuesday, Thursday and Saturday 06/10/19   Kathie Dike, MD    Physical Exam: BP (!) 147/89 (BP Location: Right Arm)   Pulse 73   Temp 97.7 F (36.5 C) (Oral)   Resp 16   Ht '5\' 11"'$  (1.803 m)   Wt 102.1 kg   SpO2 96%   BMI 31.38 kg/m   General: 50 y.o. year-old male well developed well nourished in no acute distress.  Alert and oriented x3. HEENT: NCAT, EOMI Neck:  Supple, trachea medial Cardiovascular: Regular rate and rhythm with no rubs or gallops.  No thyromegaly or JVD noted.  No lower extremity edema. 2/4 pulses in all 4 extremities. Respiratory: Clear to auscultation with no wheezes or rales.  Abdomen: Soft, nontender nondistended with normal bowel sounds x4 quadrants. Muskuloskeletal: Right BKA.  Left arm AV fistula with good palpable thrill.  No cyanosis, clubbing or edema noted bilaterally Neuro: CN II-XII intact, strength 5/5 x 4, sensation, reflexes intact Skin: No ulcerative lesions noted or rashes Psychiatry: Judgement and insight appear normal. Mood is appropriate for condition and setting          Labs on Admission:  Basic Metabolic Panel: Recent Labs  Lab 07/21/20 0616 07/27/20 1823  NA 134* 137  K 5.1 5.8*  CL 95* 98  CO2 25 21*  GLUCOSE 87 87  BUN 45* 86*  CREATININE 8.72* 13.13*  CALCIUM 8.4* 6.6*  MG 2.2 2.3  PHOS 8.2*  --    Liver Function Tests: Recent Labs  Lab 07/21/20 0616 07/27/20 1823  AST 20 20  ALT 14 16  ALKPHOS 60 59  BILITOT 0.7 0.7  PROT 6.7 6.6  ALBUMIN 3.5 3.5   No results for input(s): LIPASE, AMYLASE in the last 168 hours. No results for input(s): AMMONIA in the last 168 hours. CBC: Recent Labs  Lab 07/21/20 0616 07/27/20 1823  WBC 6.4 9.1  NEUTROABS 5.1 6.7  HGB 10.1* 9.0*  HCT 31.3* 28.0*  MCV 105.0* 104.5*  PLT 188 203   Cardiac Enzymes: No results for input(s): CKTOTAL, CKMB, CKMBINDEX, TROPONINI in the last 168 hours.  BNP (last 3 results) Recent Labs    07/03/20 0618 07/20/20 0248 07/27/20 1857  BNP 2,185.0* 1,637.0* 2,631.0*    ProBNP (last 3 results) No results for input(s): PROBNP in the last 8760 hours.  CBG: Recent Labs  Lab 07/21/20 0722 07/21/20 1123  GLUCAP 83 85    Radiological Exams on Admission: DG Chest Port 1 View  Result Date: 07/27/2020 CLINICAL DATA:  Sob, Covid pos on 07/20/20, States he was advised to come here for dialysis. He was covid  positive over a week ago and the [employer] he uses for transportation will not allow him to ride to dialysis. EXAM: PORTABLE CHEST 1 VIEW COMPARISON:  07/20/2020 and older exams. FINDINGS: Cardiac silhouette is mildly enlarged. No mediastinal or hilar masses. Clear lungs.  No convincing pleural effusion and no pneumothorax. Skeletal structures are grossly intact. IMPRESSION: No acute cardiopulmonary disease. Electronically Signed   By: Lajean Manes  M.D.   On: 07/27/2020 18:55    EKG: I independently viewed the EKG done and my findings are as followed: Normal sinus rhythm at rate of 70 bpm with prolonged QTc-520 ms and with prolonged PR interval  Assessment/Plan Present on Admission:  Hyperkalemia  Macrocytic anemia  Hyperlipidemia  Principal Problem:   ESRD (end stage renal disease) ([place]) Active Problems:   Hyperlipidemia   Hyperkalemia   Prolonged QT interval   Hypocalcemia   Macrocytic anemia   Chronic systolic CHF (congestive heart failure) (HCC)  ESRD on HD (MWF) Patient was unable to have dialysis today due to being found down by his transportation because of his recent COVID-19 virus pneumonia diagnosis (from which patient has already received treatment) Continue PhosLo and calcium carbonate Nephrology is consulted and dialysis will be done in the morning  Hyperkalemia K+ 5.8, calcium gluconate was given, Lokelma was given  Hypocalcemia Corrected calcium level was 7.0, calcium gluconate was given Continue calcium carbonate and PhosLo  Prolonged QTc (530 ms) This is possibly due to hyperkalemia Avoid QT prolonging drugs Magnesium 2.3 Repeat EKG in the morning  Macrocytic anemia MCV 104.5, vitamin B12 and folate levels will be checked  Chronic respiratory failure with hypoxia possibly secondary to combined chronic systolic and diastolic CHF Echocardiogram done on 04/08/2020 showed LVEF of 20 to 25% Patient complained of shortness of breath, though not worse than his baseline  shortness of breath Nephrology already consulted and dialysis will be done in the morning per ED PA Continue aspirin, Coreg, Lipitor Continue total input/output, daily weights and fluid restriction Continue Cardiac diet   Elevated BNP (chronic) MCV 2,631 (this was 1,637 on 6/13 and consistently stayed elevated since 1 year ago Continue treatment as described above  History of A. fib with RVR CHA2DS2- VASc score   is = 3 Which is  equal to = 3.2% annual risk of stroke  Current EKG shows normal sinus rhythm at rate of 70 bpm Resume home Coreg when BP improves Continue home Eliquis  History of T2DM Last A1c was 5.1 on 5/27 Continue modified carbohydrate diet  Hyperlipidemia Continue statin  DVT prophylaxis: Eliquis  Code Status: Full code  Family Communication: None at bedside  Disposition Plan:  Patient is from:                        home Anticipated DC to:                   SNF or family members home Anticipated DC date:               2-3 days Anticipated DC barriers:          Patient requires inpatient management due to missed hemodialysis pending nephrology consult    Consults called: Nephrology  Admission status: Observation    Bernadette Hoit MD [facility]  07/27/2020, 9:20 PM

## 2020-07-27 NOTE — ED Triage Notes (Signed)
States he was advised to come here for dialysis. He was covid positive over a week ago and the [employer] he uses for transportation will not allow him to ride to dialysis.

## 2020-07-27 NOTE — ED Provider Notes (Signed)
[facility] Provider Note   CSN: [id number] Arrival date & time: 07/27/20  1552     History No chief complaint on file.   Brendan Campbell is a 50 y.o. male.  HPI  Patient with significant medical history of end-stage renal disease currently on hemodialysis Monday Wednesday Friday, CHF, diabetes type 2, presents with chief complaint of nausea.  Patient states he was supposed to get his dialysis treatment today but was turned away by his transport.  Patient states he was diagnosed with COVID last Monday he cannot use his transport service due to his positive COVID test.  Patient states he got his dialysis treatment on Friday his full treatment.  He endorses that starting today he has felt more short of breath, worsening orthopnea, but denies worsening pedal edema, denies chest pain, abdominal pain, fevers or chills.  Patient states he is not vaccine against COVID, has not gotten his influenza shot.  Denies any alleviating factors.  Past Medical History:  Diagnosis Date   Anemia    Anxiety    Blood transfusion without reported diagnosis    CHF (congestive heart failure) ([place])    a. EF 25% by echo in 07/2019   Chronic kidney disease    Depression    Diabetes mellitus without complication ([place])    End stage renal disease ([place])    M/W/F [facility]   Hyperlipidemia    Neuropathy    Peripheral vascular disease ([place])    PTSD (post-traumatic stress disorder)     Patient Active Problem List   Diagnosis Date Noted   Volume overload 07/20/2020   Acute hypoxemic respiratory failure due to COVID-19 ([place]) 07/20/2020   Acute on chronic respiratory failure with hypoxia ([place]) 07/03/2020   Acute on chronic combined systolic and diastolic CHF (congestive heart failure) ([place]) 04/08/2020   Left hemiparesis ([place]) 04/08/2020   Lumbar spondylosis 04/08/2020   Atrial fibrillation with RVR ([place]) 04/08/2020   Elevated troponin 04/08/2020   Pain in limb 12/29/2019   Headache  disorder 12/29/2019   Pain in left arm 12/29/2019   Cervical radiculopathy 12/29/2019   Acute congestive heart failure ([place]) 12/13/2019   Macrocytic anemia 09/25/2019   Paresthesia 09/24/2019   Chronic atrial fibrillation ([place])    Hyperparathyroidism ([place])    Cardiac arrest ([place]) 06/07/2019   Neuropathy    Peripheral vascular disease (HCC)    Leukocytosis    Atypical pneumonia    Acute pulmonary edema ([place])    ESRD on hemodialysis ([place])    Hyperkalemia 10/05/2018   Weakness 10/05/2018   Anemia due to end stage renal disease ([place]) 05/10/2016   Spondylosis of lumbar spine 03/08/2016   Lumbar radiculopathy 03/08/2016   S/P below knee amputation, right ([place]) 03/05/2015   Type 2 diabetes mellitus with diabetic chronic kidney disease ([place]) 03/03/2015   Hyperlipidemia 03/03/2015   Low back pain 03/03/2015    Past Surgical History:  Procedure Laterality Date   A/V FISTULAGRAM N/A 10/09/2018   Procedure: A/V FISTULAGRAM;  Surgeon: Serafina Mitchell, MD;  Location: [place] [facility];  Service: Cardiovascular;  Laterality: N/A;   AV FISTULA PLACEMENT Left 09/22/2016   Procedure: CREATION OF LEFT ARM ARTERIOVENOUS (AV) FISTULA;  Surgeon: Waynetta Sandy, MD;  Location: [facility];  Service: Vascular;  Laterality: Left;   AV FISTULA PLACEMENT Left 10/31/2017   Procedure: INSERTION OF ARTERIOVENOUS (AV) GORE-TEX 4-67m STETCH GRAFT LEFT ARM;  Surgeon: CWaynetta Sandy MD;  Location: [facility]  Service: Vascular;  Laterality: Left;   BASCILIC VEIN TRANSPOSITION Left 08/17/2017   Procedure: SECOND STAGE BASILIC VEIN TRANSPOSITION LEFT ARM;  Surgeon: Waynetta Sandy, MD;  Location: [facility];  Service: Vascular;  Laterality: Left;   BELOW KNEE LEG AMPUTATION Right    CARDIOVERSION N/A 02/19/2019   Procedure: CARDIOVERSION;  Surgeon: [name], MD;  [facility];  Service: Cardiovascular;  Laterality: N/A;   CHOLECYSTECTOMY     FOOT SURGERY     IR FLUORO GUIDE CV  LINE RIGHT  05/16/2016   IR FLUORO GUIDE CV LINE RIGHT  02/16/2019   IR REMOVAL TUN CV CATH W/O FL  05/16/2016   IR REMOVAL TUN CV CATH W/O FL  02/19/2019   IR THROMBECTOMY AV FISTULA W/THROMBOLYSIS/PTA INC/SHUNT/IMG LEFT Left 11/09/2018   IR THROMBECTOMY AV FISTULA W/THROMBOLYSIS/PTA INC/SHUNT/IMG LEFT Left 06/22/2019   IR US GUIDE VASC ACCESS LEFT  11/09/2018   IR US GUIDE VASC ACCESS LEFT  06/22/2019   IR US GUIDE VASC ACCESS RIGHT  05/16/2016   IR US GUIDE VASC ACCESS RIGHT  02/16/2019   PERIPHERAL VASCULAR BALLOON ANGIOPLASTY  10/09/2018   Procedure: PERIPHERAL VASCULAR BALLOON ANGIOPLASTY;  Surgeon: Serafina Mitchell, MD;  Location: [facility] [facility];  Service: Cardiovascular;;   TEE WITHOUT CARDIOVERSION N/A 02/19/2019   Procedure: TRANSESOPHAGEAL ECHOCARDIOGRAM (TEE);  Surgeon: [name], MD;  Location: [facility];  Service: Cardiovascular;  Laterality: N/A;       Family History  Problem Relation Age of Onset   Cancer Mother        lung   Diabetes Mother    Heart attack Father    Diabetes Father    Diabetes Sister     Social History   Tobacco Use   Smoking status: Some Days    Pack years: 0.00    Types: Cigarettes    Last attempt to quit: 09/03/2006    Years since quitting: 13.9   Smokeless tobacco: Never  Vaping Use   Vaping Use: Never used  Substance Use Topics   Alcohol use: No   Drug use: No    Home Medications Prior to Admission medications   Medication Sig Start Date End Date Taking? Authorizing Provider  albuterol (VENTOLIN HFA) 108 (90 Base) MCG/ACT inhaler Inhale 2 puffs into the lungs every 4 (four) hours as needed for wheezing or shortness of breath. 07/21/20   Manuella Ghazi, Pratik D, DO  apixaban (ELIQUIS) 5 MG TABS tablet Take 1 tablet (5 mg total) by mouth 2 (two) times daily. 08/23/19   Strader, Fransisco Hertz, PA-C  aspirin 81 MG chewable tablet Chew 81 mg by mouth daily.    [provider]  atorvastatin (LIPITOR) 10 MG tablet Take 1 tablet (10 mg total) by  mouth every evening. 08/03/19   Barton Dubois, MD  butalbital-acetaminophen-caffeine (FIORICET) [phone] MG tablet Take 1 tablet by mouth every 6 (six) hours as needed for headache or migraine. 07/21/20   Manuella Ghazi, Pratik D, DO  calcium acetate (PHOSLO) 667 MG capsule Take 1 capsule (667 mg total) by mouth 3 (three) times daily with meals. 02/21/19   Donnamae Jude, MD  calcium carbonate (TUMS - DOSED IN MG ELEMENTAL CALCIUM) 500 MG chewable tablet Chew 4 tablets (800 mg of elemental calcium total) by mouth 2 (two) times daily. 08/03/19   Barton Dubois, MD  carvedilol (COREG) 3.125 MG tablet Take 1 tablet (3.125 mg total) by mouth 2 (two) times daily with a meal. 04/09/20 07/21/20  Deatra Elijio, MD  DULoxetine (CYMBALTA) 30 MG capsule Take 30 mg by mouth 2 (two) times daily. 12/26/19   [provider]  LANTUS SOLOSTAR 100 UNIT/ML Solostar Pen Inject 10 Units into the skin at bedtime.  01/18/19   [provider]  lidocaine-prilocaine (EMLA) cream Apply 1 application topically See admin instructions. Applied three times weekly at dialysis on MWF 05/06/19   [provider]  multivitamin (RENA-VIT) TABS tablet Take 1 tablet by mouth daily. 08/04/18   [provider]  patiromer (VELTASSA) 8.4 g packet Take 1 packet (8.4 g total) by mouth 3 (three) times a week. On Tuesday, Thursday and Saturday 06/10/19   Kathie Dike, MD    Allergies    Tape and Other  Review of Systems   Review of Systems  Constitutional:  Negative for chills and fever.  HENT:  Negative for congestion and sore throat.   Respiratory:  Positive for shortness of breath.   Cardiovascular:  Negative for chest pain and leg swelling.  Gastrointestinal:  Positive for nausea. Negative for abdominal pain.  Genitourinary:  Negative for enuresis.  Musculoskeletal:  Negative for back pain.  Skin:  Negative for rash.  Neurological:  Negative for headaches.  Hematological:  Does not bruise/bleed easily.    Physical Exam Updated Vital Signs BP (!) 147/89 (BP Location: Right Arm)   Pulse 73   Temp 97.7 F (36.5 C) (Oral)   Resp 16   Ht '5\' 11"'$  (1.803 m)   Wt 102.1 kg   SpO2 96%   BMI 31.38 kg/m   Physical Exam Vitals and nursing note reviewed.  Constitutional:      General: He is not in acute distress.    Appearance: He is not ill-appearing.  HENT:     Head: Normocephalic and atraumatic.     Nose: No congestion.  Eyes:     Conjunctiva/sclera: Conjunctivae normal.  Cardiovascular:     Rate and Rhythm: Normal rate and regular rhythm.     Pulses: Normal pulses.     Heart sounds: No murmur heard.   No friction rub. No gallop.  Pulmonary:     Effort: No respiratory distress.     Breath sounds: No wheezing, rhonchi or rales.  Abdominal:     Tenderness: There is no abdominal tenderness. There is no right CVA tenderness or left CVA tenderness.  Musculoskeletal:     Comments: Right below the knee amputation  Skin:    General: Skin is warm and dry.     Comments: Dialysis fistula on the left arm, good palpable thrill.  Neurological:     Mental Status: He is alert.  Psychiatric:        Mood and Affect: Mood normal.    ED Results / Procedures / Treatments   Labs (all labs ordered are listed, but only abnormal results are displayed) Labs Reviewed  COMPREHENSIVE METABOLIC PANEL - Abnormal; Notable for the following components:      Result Value   Potassium 5.8 (*)    CO2 21 (*)    BUN 86 (*)    Creatinine, Ser 13.13 (*)    Calcium 6.6 (*)    GFR, Estimated 4 (*)    Anion gap 18 (*)    All other components within normal limits  CBC WITH DIFFERENTIAL/PLATELET - Abnormal; Notable for the following components:   RBC 2.68 (*)    Hemoglobin 9.0 (*)    HCT 28.0 (*)    MCV 104.5 (*)    Abs Immature Granulocytes 0.08 (*)  All other components within normal limits  BRAIN NATRIURETIC PEPTIDE - Abnormal; Notable for the following components:   B Natriuretic Peptide 2,631.0 (*)     All other components within normal limits  RESP PANEL BY RT-PCR (FLU A&B, COVID) [id number]  MAGNESIUM  COMPREHENSIVE METABOLIC PANEL  CBC  PROTIME-INR  APTT  MAGNESIUM  PHOSPHORUS    EKG EKG Interpretation  Date/Time:  Monday July 27 2020 19:10:49 EDT Ventricular Rate:  70 PR Interval:  205 QRS Duration: 131 QT Interval:  491 QTC Calculation: 530 R Axis:   -51 Text Interpretation: Sinus rhythm Borderline prolonged PR interval Left bundle branch block Baseline wander in lead(s) V3 V6 tachycardia resolved compared to last week Confirmed by Sherwood Gambler [phone]) on 07/27/2020 8:47:53 PM  Radiology DG Chest Port 1 View  Result Date: 07/27/2020 CLINICAL DATA:  Sob, Covid pos on 07/20/20, States he was advised to come here for dialysis. He was covid positive over a week ago and the [facility] he uses for transportation will not allow him to ride to dialysis. EXAM: PORTABLE CHEST 1 VIEW COMPARISON:  07/20/2020 and older exams. FINDINGS: Cardiac silhouette is mildly enlarged. No mediastinal or hilar masses. Clear lungs.  No convincing pleural effusion and no pneumothorax. Skeletal structures are grossly intact. IMPRESSION: No acute cardiopulmonary disease. Electronically Signed   By: Lajean Manes M.D.   On: 07/27/2020 18:55    Procedures Procedures   Medications Ordered in ED Medications  calcium gluconate 2 g/ 100 mL sodium chloride IVPB (has no administration in time range)  heparin injection 5,000 Units (has no administration in time range)  sodium zirconium cyclosilicate (LOKELMA) packet 10 g (10 g Oral Given 07/27/20 2029)    ED Course  I have reviewed the triage vital signs and the nursing notes.  Pertinent labs & imaging results that were available during my care of the patient were reviewed by me and considered in my medical decision making (see chart for details).    MDM Rules/Calculators/A&P                         Initial impression-patient presents with shortness of breath  and feeling volume overloaded.  He is alert, does not appear in acute stress, vital signs reassuring.  Will obtain basic lab work-up, chest x-ray, EKG and reassess.  Work-up-CBC shows macrocytic anemia with a hemoglobin of 9, CMP shows hyperkalemia 5.8, decreased CO2 of 21, creatinine 13, calcium 6.6, anion gap 18.  Magnesium 2.3, BNP 2631.  EKG sinus rhythm not signs of ischemia, there is a noted prolonged QT.  Reassessment-patient was updated on lab work and imaging he has no complaints this time.  Will consult with nephrology for further recommendations.  Updated patient on nephrology recommendations patient is agreement this plan.  Will consult with hospitalist for admission.  Consult- Spoke with Dr Joelyn Oms of nephrology, he recommends medical admission, provide patient with IV calcium, Lokelma for potassium, consult with social work for transportation needs, and his team will consult on him in the morning.  Spoke with Dr. Josephine Cables who will admit the patient.  Rule out-low suspicion for systemic infection as patient is nontoxic-appearing, vital signs reassuring, no obvious source of infection present my exam.  Low suspicion for emergent dialysis as patient was not volume overloaded, vital signs reassuring, there is noted electrolyte derailment with slight prolongated QT but I suspect with Lokelma as well as calcium this should resolve. will continue to monitor.  Low suspicion for  ACS as patient has chest pain, shortness of breath, no signs of ischemia seen on EKG.  Plan-admit to medicine electrolyte derailments secondary due to missed dialysis.    Final Clinical Impression(s) / ED Diagnoses Final diagnoses:  Hyperkalemia  Hypocalcemia    Rx / DC Orders ED Discharge Orders     None        Marcello Fennel, PA-C 07/27/20 2058    Sherwood Gambler, MD 07/30/20 [phone]

## 2020-07-28 ENCOUNTER — Encounter (HOSPITAL_COMMUNITY): Payer: Self-pay | Admitting: Internal Medicine

## 2020-07-28 ENCOUNTER — Other Ambulatory Visit: Payer: Self-pay

## 2020-07-28 DIAGNOSIS — I5022 Chronic systolic (congestive) heart failure: Secondary | ICD-10-CM | POA: Diagnosis not present

## 2020-07-28 DIAGNOSIS — E875 Hyperkalemia: Secondary | ICD-10-CM | POA: Diagnosis not present

## 2020-07-28 DIAGNOSIS — N186 End stage renal disease: Secondary | ICD-10-CM | POA: Diagnosis not present

## 2020-07-28 DIAGNOSIS — U071 COVID-19: Secondary | ICD-10-CM

## 2020-07-28 DIAGNOSIS — E782 Mixed hyperlipidemia: Secondary | ICD-10-CM | POA: Diagnosis not present

## 2020-07-28 LAB — COMPREHENSIVE METABOLIC PANEL
ALT: 14 U/L (ref 0–44)
AST: 16 U/L (ref 15–41)
Albumin: 3.3 g/dL — ABNORMAL LOW (ref 3.5–5.0)
Alkaline Phosphatase: 57 U/L (ref 38–126)
Anion gap: 16 — ABNORMAL HIGH (ref 5–15)
BUN: 92 mg/dL — ABNORMAL HIGH (ref 6–20)
CO2: 22 mmol/L (ref 22–32)
Calcium: 6.9 mg/dL — ABNORMAL LOW (ref 8.9–10.3)
Chloride: 99 mmol/L (ref 98–111)
Creatinine, Ser: 13.99 mg/dL — ABNORMAL HIGH (ref 0.61–1.24)
GFR, Estimated: 4 mL/min — ABNORMAL LOW (ref >60)
Glucose, Bld: 77 mg/dL (ref 70–99)
Potassium: 6.2 mmol/L — ABNORMAL HIGH (ref 3.5–5.1)
Sodium: 137 mmol/L (ref 135–145)
Total Bilirubin: 0.9 mg/dL (ref 0.3–1.2)
Total Protein: 6.2 g/dL — ABNORMAL LOW (ref 6.5–8.1)

## 2020-07-28 LAB — CBC
HCT: 30 % — ABNORMAL LOW (ref 39.0–52.0)
Hemoglobin: 9.8 g/dL — ABNORMAL LOW (ref 13.0–17.0)
MCH: 34 pg (ref 26.0–34.0)
MCHC: 32.7 g/dL (ref 30.0–36.0)
MCV: 104.2 fL — ABNORMAL HIGH (ref 80.0–100.0)
Platelets: 226 10*3/uL (ref 150–400)
RBC: 2.88 MIL/uL — ABNORMAL LOW (ref 4.22–5.81)
RDW: 12.8 % (ref 11.5–15.5)
WBC: 13.3 10*3/uL — ABNORMAL HIGH (ref 4.0–10.5)
nRBC: 0 % (ref 0.0–0.2)

## 2020-07-28 LAB — MAGNESIUM: Magnesium: 2 mg/dL (ref 1.7–2.4)

## 2020-07-28 LAB — PROTIME-INR
INR: 1.2 (ref 0.8–1.2)
Prothrombin Time: 14.7 seconds (ref 11.4–15.2)

## 2020-07-28 LAB — PHOSPHORUS: Phosphorus: 8.5 mg/dL — ABNORMAL HIGH (ref 2.5–4.6)

## 2020-07-28 LAB — FOLATE: Folate: 10.9 ng/mL (ref >5.9)

## 2020-07-28 LAB — APTT: aPTT: 32 seconds (ref 24–36)

## 2020-07-28 LAB — VITAMIN B12: Vitamin B-12: 450 pg/mL (ref 180–914)

## 2020-07-28 MED ORDER — HEPARIN SODIUM (PORCINE) 1000 UNIT/ML DIALYSIS: 20.0000 [IU]/kg | INTRAMUSCULAR | Status: DC | PRN

## 2020-07-28 MED ORDER — LIDOCAINE HCL (PF) 1 % IJ SOLN: 5.0000 mL | INTRAMUSCULAR | Status: DC | PRN

## 2020-07-28 MED ORDER — SODIUM CHLORIDE 0.9 % IV SOLN: 100.0000 mL | INTRAVENOUS | Status: DC | PRN

## 2020-07-28 MED ORDER — PENTAFLUOROPROP-TETRAFLUOROETH EX AERO: 1.0000 "application " | INHALATION_SPRAY | CUTANEOUS | Status: DC | PRN

## 2020-07-28 MED ORDER — CHLORHEXIDINE GLUCONATE CLOTH 2 % EX PADS
6.0000 | MEDICATED_PAD | Freq: Every day | CUTANEOUS | Status: DC
  Administered 2020-07-28: 6 via TOPICAL

## 2020-07-28 MED ORDER — LIDOCAINE-PRILOCAINE 2.5-2.5 % EX CREA: 1.0000 "application " | TOPICAL_CREAM | CUTANEOUS | Status: DC | PRN

## 2020-07-28 MED ORDER — HEPARIN SODIUM (PORCINE) 1000 UNIT/ML DIALYSIS: 1000.0000 [IU] | INTRAMUSCULAR | Status: DC | PRN

## 2020-07-28 MED ORDER — GUAIFENESIN-DM 100-10 MG/5ML PO SYRP
5.0000 mL | ORAL_SOLUTION | Freq: Four times a day (QID) | ORAL | Status: DC | PRN
  Administered 2020-07-28: 5 mL via ORAL
  Filled 2020-07-28: qty 5

## 2020-07-28 MED ORDER — ALTEPLASE 2 MG IJ SOLR: 2.0000 mg | Freq: Once | INTRAMUSCULAR | Status: DC | PRN

## 2020-07-28 MED ORDER — GUAIFENESIN-DM 100-10 MG/5ML PO SYRP: 5.0000 mL | ORAL_SOLUTION | Freq: Four times a day (QID) | ORAL | 0 refills | Status: DC | PRN

## 2020-07-28 NOTE — ED Notes (Signed)
Report called to third floor to [place].

## 2020-07-28 NOTE — TOC Initial Note (Addendum)
Transition of Care [facility]) - Initial/Assessment Note    Patient Details  Name: Brendan Campbell MRN: [id number] Date of Birth: Jul 04, 1970  Transition of Care [facility] CM/SW Contact:    Ihor Gully, LCSW Phone Number: 07/28/2020, 12:50 PM  Clinical Narrative:                 Patient from home with roommate. Admitted COVID+, missed HD tx. Spoke with Franki Cabot at [facility]. Patient is typically a MWF patient, however he normally misses one treatment a week. Patient uses [facility] for transportation. Patient had been scheduled for third shift due to COVID+ dx, however there were transportation barriers. Patient's roommate took him to a few treatments but was not able to consistently bring him. Per Rayburn Ma, patient advised that he would come hospital if he started feeling bad related to missed HD tx.  Patient can return to the facility on Wednesday to his regular treatment chair day and time. Rayburn Ma to notify Joy with [facility] of patient's discharge today and [facility]aryl [facility] will schedule transportation for patient's chair Friday. Advised that patient is scheduled to receive HD today. Franki Cabot stated that patient could still come tomorrow for an extra treatment.  Patient advised that he is to return to his 3p.m. chair time and that transportation has been arranged to through [facility] to pick patient up and return him to his home on 6/22 around for 3 p.m. chair time. [facility] will resume transport on Friday. [facility] will pick patient up at discharge today at 5 p.m. HD RN, bedside RN, and attending made aware.  Expected Discharge Plan: Home/Self Care Barriers to Discharge: Continued Medical Work up   Patient Goals and CMS Choice Patient states their goals for this hospitalization and ongoing recovery are:: return home      Expected Discharge Plan and Services Expected Discharge Plan: Home/Self Care       Living arrangements for the past 2 months: Single Family Home                                       Prior Living Arrangements/Services Living arrangements for the past 2 months: Single Family Home Lives with:: Roommate Patient language and need for interpreter reviewed:: Yes Do you feel safe going back to the place where you live?: Yes      Need for Family Participation in Patient Care: Yes (Comment) Care giver support system in place?: Yes (comment)   Criminal Activity/Legal Involvement Pertinent to Current Situation/Hospitalization: No - Comment as needed  Activities of Daily Living Home Assistive Devices/Equipment: Prosthesis ADL Screening (condition at time of admission) Patient's cognitive ability adequate to safely complete daily activities?: Yes Is the patient deaf or have difficulty hearing?: No Does the patient have difficulty seeing, even when wearing glasses/contacts?: No Does the patient have difficulty concentrating, remembering, or making decisions?: No Patient able to express need for assistance with ADLs?: Yes Does the patient have difficulty dressing or bathing?: No Independently performs ADLs?: Yes (appropriate for developmental age) Does the patient have difficulty walking or climbing stairs?: Yes Weakness of Legs: Left Weakness of Arms/Hands: None  Permission Sought/Granted Permission sought to share information with : Other (comment)    Share Information with NAME: Brendan Campbell  Permission granted to share info w AGENCY: [facility]        Emotional Assessment     Affect (typically observed): Appropriate Orientation: : Oriented to Self, Oriented  to Place, Oriented to  Time, Oriented to Situation Alcohol / Substance Use: Not Applicable Psych Involvement: No (comment)  Admission diagnosis:  Hypocalcemia [E83.51] Hyperkalemia [E87.5] Patient Active Problem List   Diagnosis Date Noted   Chronic systolic CHF (congestive heart failure) ([place]) 07/27/2020   Volume overload 07/20/2020   Acute hypoxemic respiratory failure due to COVID-19 ([place])  07/20/2020   Acute on chronic respiratory failure with hypoxia ([place]) 07/03/2020   Acute on chronic combined systolic and diastolic CHF (congestive heart failure) ([place]) 04/08/2020   Left hemiparesis ([place]) 04/08/2020   Lumbar spondylosis 04/08/2020   Atrial fibrillation with RVR ([place]) 04/08/2020   Elevated troponin 04/08/2020   Pain in limb 12/29/2019   Headache disorder 12/29/2019   Pain in left arm 12/29/2019   Cervical radiculopathy 12/29/2019   Acute congestive heart failure ([place]) 12/13/2019   Macrocytic anemia 09/25/2019   Paresthesia 09/24/2019   Hypocalcemia    Chronic atrial fibrillation ([place])    Hyperparathyroidism ([place])    Cardiac arrest ([place]) 06/07/2019   Neuropathy    Peripheral vascular disease (HCC)    Prolonged QT interval    Leukocytosis    Atypical pneumonia    Acute pulmonary edema ([place])    ESRD on hemodialysis ([place])    Hyperkalemia 10/05/2018   Weakness 10/05/2018   ESRD (end stage renal disease) ([place]) 05/10/2016   Spondylosis of lumbar spine 03/08/2016   Lumbar radiculopathy 03/08/2016   S/P below knee amputation, right ([place]) 03/05/2015   Type 2 diabetes mellitus with diabetic chronic kidney disease ([place]) 03/03/2015   Hyperlipidemia 03/03/2015   Low back pain 03/03/2015   PCP:  Monico Blitz, MD Pharmacy:   [id number] [place] [address]Phone: [phone]: [phone] [facility] [address]Phone: [phone]: [phone]    Social Determinants of Health (SDOH) Interventions    Readmission Risk Interventions Readmission Risk Prevention Plan 07/21/2020  Transportation Screening Complete  PCP or Specialist Appt within 3-5 Days Complete  [place] or [place]  Social Work Consult for ROla[place]anning/Counseling Complete  Palliative Care Screening Not Applicable  Medication Review (Designer, fashion/clothing Complete  Some recent data might be hidden

## 2020-07-28 NOTE — Progress Notes (Signed)
Nsg Discharge Note  Admit Date:  07/27/2020 Discharge date: 07/28/2020   OTHMAR FACTOR to be D/C'd Home per MD order.  AVS completed.  Copy for chart, and copy for patient signed, and dated. Patient/caregiver able to verbalize understanding.  Discharge Medication: Allergies as of 07/28/2020       Reactions   Tape Other (See Comments)   Pulls skin off   Other         Medication List     TAKE these medications    albuterol 108 (90 Base) MCG/ACT inhaler Commonly known as: VENTOLIN HFA Inhale 2 puffs into the lungs every 4 (four) hours as needed for wheezing or shortness of breath.   apixaban 5 MG Tabs tablet Commonly known as: ELIQUIS Take 1 tablet (5 mg total) by mouth 2 (two) times daily.   aspirin 81 MG chewable tablet Chew 81 mg by mouth daily.   atorvastatin 10 MG tablet Commonly known as: LIPITOR Take 1 tablet (10 mg total) by mouth every evening.   butalbital-acetaminophen-caffeine 50-325-40 MG tablet Commonly known as: FIORICET Take 1 tablet by mouth every 6 (six) hours as needed for headache or migraine.   calcium acetate 667 MG capsule Commonly known as: PHOSLO Take 1 capsule (667 mg total) by mouth 3 (three) times daily with meals.   calcium carbonate 500 MG chewable tablet Commonly known as: TUMS - dosed in mg elemental calcium Chew 4 tablets (800 mg of elemental calcium total) by mouth 2 (two) times daily.   carvedilol 3.125 MG tablet Commonly known as: Coreg Take 1 tablet (3.125 mg total) by mouth 2 (two) times daily with a meal.   DULoxetine 30 MG capsule Commonly known as: CYMBALTA Take 30 mg by mouth 2 (two) times daily.   guaiFENesin-dextromethorphan 100-10 MG/5ML syrup Commonly known as: ROBITUSSIN DM Take 5 mLs by mouth every 6 (six) hours as needed for cough.   Lantus SoloStar 100 UNIT/ML Solostar Pen Generic drug: insulin glargine Inject 10 Units into the skin at bedtime.   lidocaine-prilocaine cream Commonly known as: EMLA Apply 1  application topically See admin instructions. Applied three times weekly at dialysis on MWF   multivitamin Tabs tablet Take 1 tablet by mouth daily.   Veltassa 8.4 g packet Generic drug: patiromer Take 1 packet (8.4 g total) by mouth 3 (three) times a week. On Tuesday, Thursday and Saturday        Discharge Assessment: Vitals:   07/28/20 1545 07/28/20 1600  BP: 134/78 124/78  Pulse: 77 76  Resp:    Temp:    SpO2:     Skin clean, dry and intact without evidence of skin break down, no evidence of skin tears noted. IV catheter discontinued intact. Site without signs and symptoms of complications - no redness or edema noted at insertion site, patient denies c/o pain - only slight tenderness at site.  Dressing with slight pressure applied.  D/c Instructions-Education: Discharge instructions given to patient/family with verbalized understanding. D/c education completed with patient/family including follow up instructions, medication list, d/c activities limitations if indicated, with other d/c instructions as indicated by MD - patient able to verbalize understanding, all questions fully answered. Patient instructed to return to ED, call 911, or call MD for any changes in condition.  Patient escorted via [place], and D/C home via private auto.  Clovis Fredrickson, LPN [id number] PM

## 2020-07-28 NOTE — Discharge Summary (Signed)
Physician Discharge Summary  Brendan Campbell [id number] DOB: [birth year] DOA: 07/27/2020  PCP: Monico Blitz, MD  Admit date: 07/27/2020 Discharge date: 07/28/2020  Time spent: 35 minutes  Recommendations for Outpatient Follow-up:  Repeat basic metabolic panel to follow electrolytes and renal function Reassess blood pressure and adjust antihypertensive regimen as needed. Outpatient follow-up with cardiology and nephrology service recommended.  Discharge Diagnoses:  Principal Problem:   ESRD (end stage renal disease) ([place]) Active Problems:   Hyperlipidemia   Hyperkalemia   Prolonged QT interval   Hypocalcemia   Macrocytic anemia   COVID-19 virus infection   Chronic systolic CHF (congestive heart failure) (Brendan Campbell)   Discharge Condition: Stable and improved.  Discharged home with instruction to follow-up with PCP and nephrology service as an outpatient.  CODE STATUS: Full code.  Diet recommendation: Renal diet and modify carbohydrates.  Filed Weights   07/27/20 1723 07/28/20 1230  Weight: 102.1 kg 102.4 kg    History of present illness:  As per H&P written by Dr. Josephine Cables on 07/27/2020 Brendan Campbell is a 50 y.o. male with medical history significant for  ESRD on HD (MWF), anemia, anxiety, depression, PTSD, chronic systolic CHF on 2 LPM of oxygen at baseline, type II DM, hyperlipidemia, diabetic peripheral neuropathy, PVD who presents to the emergency department for maintenance dialysis.  Patient states that he was supposed to get his dialysis treatment today, but his transportation refused to take him for his dialysis treatment due to being recently diagnosed to have COVID-19 virus infection (6/13), so he called dialysis center and was asked to go to the ED for hemodialysis.  Last dialysis was on Friday (6/17), he complained of nausea and some shortness of breath.  He was admitted from 6/13-6/14 due to acute respiratory failure with hypoxia in the setting of COVID-19 pneumonia during  which she was treated with remdesivir and dexamethasone and he had dialysis on 6/30 with significant improvement in oxygen requirement back to baseline.  He endorsed some nausea, but denied chest pain, worsening shortness of breath, fever, chills, vomiting or abdominal pain   ED Course:  In the emergency department, he was hemodynamically stable.  Work-up in the ED showed macrocytic anemia, hyperkalemia, BUN/creatinine 86/13.13, hypocalcemia. Chest x-ray showed no acute cardiopulmonary disease Nephrologist was called and recommended calcium gluconate and Lokelma with plan to dialyze patient in the morning.  Hospitalist was asked to admit patient for further evaluation and management.  Hospital Course:  1-End-stage renal disease on hemodialysis (Monday, Wednesday and Friday) -Patient unable to have dialysis on his usual day (07/27/2020) as usual transportation will not taking to dialysis due to recent COVID infection. -Patient received hemodialysis in the hospital and was found otherwise hemodynamically stable and in no distress. -After discussing with TOC services: [place] will be able to provide transportation for patient also here reach the amount of days require to be eligible for [employer] transportation again. -Patient tolerated HD treatment without problems and was discharged in a stable condition.  2-hyperkalemia -Potassium 5.8 on presentation -Patient received calcium gluconate and Lokelma -Low K bath provided with hemodialysis treatment and after discussing with nephrology service patient is stable to be discharged.  3-hypocalcemia -Corrected calcium 7.0 -Patient received calcium gluconate as mentioned above -Continue calcium carbonate and the use of PhosLo -Continue patient follow-up with nephrology service.  4-prolonged QT -EKG without QT of 530 ms -Probably in the setting of electrolytes abnormality -Magnesium 2.3 -Potassium and calcium has been appropriately treated -Repeat  basic metabolic panel at follow-up visit  to assess electrolyte stability  5-anemia of chronic kidney disease -No overt bleeding -Continue to follow hemoglobin trend -Epogen and iron therapy as per nephrology service discretion.  6-chronic combined systolic and diastolic heart failure -Continue volume management with hemodialysis -Recent echocardiogram (04/08/2020) demonstrating ejection fraction 20 to 25% -Continue carvedilol and outpatient follow-up with cardiology service.  7-history of A. fib -Rate controlled -Continue Eliquis for secondary prevention.  8-history of type 2 diabetes mellitus -Continue modified carbohydrate diet and resumption of home hypoglycemic regimen.  9-hyperlipidemia -Continue statins.  10-COVID-19 infection -Patient diagnosed on 07/20/2020 and appropriately treated -Currently stable and instructed to continue following the 3W's -Continue as needed antitussive medications.   Procedures: See below for x-ray report Hemodialysis treatment provided on 07/28/2020.  Consultations: Nephrology service  Discharge Exam: Vitals:   07/28/20 1545 07/28/20 1600  BP: 134/78 124/78  Pulse: 77 76  Resp:    Temp:    SpO2:      General: Afebrile, no chest pain, no nausea, no vomiting. Cardiovascular: S1 and S2; no rubs, no gallops, no JVD on exam Respiratory: Good air movement bilaterally; no wheezing or crackles. Abdomen: Soft, nontender, positive bowel sounds Extremities: Left upper extremities with AV fistula with good palpable thrill; no cyanosis or clubbing.  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  Discharge Instructions   Discharge Instructions     Diet - low sodium heart healthy   Complete by: As directed    Discharge instructions   Complete by: As directed    Continue practicing the 3 W's (Wear mask, Wait six feet apart and Wash hands frequently) Outpatient follow-up with PCP in 10 days Resume outpatient hemodialysis treatments and follow-up with  nephrology. Follow heart healthy/modified carbohydrates diet. Take medications as prescribed.   No wound care   Complete by: As directed       Allergies as of 07/28/2020       Reactions   Tape Other (See Comments)   Pulls skin off   Other         Medication List     TAKE these medications    albuterol 108 (90 Base) MCG/ACT inhaler Commonly known as: VENTOLIN HFA Inhale 2 puffs into the lungs every 4 (four) hours as needed for wheezing or shortness of breath.   apixaban 5 MG Tabs tablet Commonly known as: ELIQUIS Take 1 tablet (5 mg total) by mouth 2 (two) times daily.   aspirin 81 MG chewable tablet Chew 81 mg by mouth daily.   atorvastatin 10 MG tablet Commonly known as: LIPITOR Take 1 tablet (10 mg total) by mouth every evening.   butalbital-acetaminophen-caffeine 50-325-40 MG tablet Commonly known as: FIORICET Take 1 tablet by mouth every 6 (six) hours as needed for headache or migraine.   calcium acetate 667 MG capsule Commonly known as: PHOSLO Take 1 capsule (667 mg total) by mouth 3 (three) times daily with meals.   calcium carbonate 500 MG chewable tablet Commonly known as: TUMS - dosed in mg elemental calcium Chew 4 tablets (800 mg of elemental calcium total) by mouth 2 (two) times daily.   carvedilol 3.125 MG tablet Commonly known as: Coreg Take 1 tablet (3.125 mg total) by mouth 2 (two) times daily with a meal.   DULoxetine 30 MG capsule Commonly known as: CYMBALTA Take 30 mg by mouth 2 (two) times daily.   guaiFENesin-dextromethorphan 100-10 MG/5ML syrup Commonly known as: ROBITUSSIN DM Take 5 mLs by mouth every 6 (six) hours as needed for cough.   Lantus SoloStar 100  UNIT/ML Solostar Pen Generic drug: insulin glargine Inject 10 Units into the skin at bedtime.   lidocaine-prilocaine cream Commonly known as: EMLA Apply 1 application topically See admin instructions. Applied three times weekly at dialysis on MWF   multivitamin Tabs  tablet Take 1 tablet by mouth daily.   Veltassa 8.4 g packet Generic drug: patiromer Take 1 packet (8.4 g total) by mouth 3 (three) times a week. On Tuesday, Thursday and Saturday       Allergies  Allergen Reactions   Tape Other (See Comments)    Pulls skin off   Other     Follow-up Information     Monico Blitz, MD. Schedule an appointment as soon as possible for a visit in 10 day(s).   Specialty: Internal Medicine Contact information: [place] [address] [phone]         Arnoldo Lenis, MD .   Specialty: Cardiology Contact information: [address] [place] [address] [phone]                  The results of significant diagnostics from this hospitalization (including imaging, microbiology, ancillary and laboratory) are listed below for reference.    Significant Diagnostic Studies: DG Chest Port 1 View  Result Date: 07/27/2020 CLINICAL DATA:  Sob, Covid pos on 07/20/20, States he was advised to come here for dialysis. He was covid positive over a week ago and the [employer] he uses for transportation will not allow him to ride to dialysis. EXAM: PORTABLE CHEST 1 VIEW COMPARISON:  07/20/2020 and older exams. FINDINGS: Cardiac silhouette is mildly enlarged. No mediastinal or hilar masses. Clear lungs.  No convincing pleural effusion and no pneumothorax. Skeletal structures are grossly intact. IMPRESSION: No acute cardiopulmonary disease. Electronically Signed   By: Lajean Manes M.D.   On: 07/27/2020 18:55   DG Chest Portable 1 View  Result Date: 07/20/2020 CLINICAL DATA:  Chest pain, shortness of breath EXAM: PORTABLE CHEST 1 VIEW COMPARISON:  07/03/2020 FINDINGS: Heart is borderline in size. No confluent airspace opacities, effusions or edema. No acute bony abnormality. IMPRESSION: No active disease. Electronically Signed   By: Rolm Baptise M.D.   On: 07/20/2020 02:53   DG Chest Port 1 View  Result Date: 07/03/2020 CLINICAL DATA:   Shortness of breath EXAM: PORTABLE CHEST 1 VIEW COMPARISON:  04/08/2020 FINDINGS: Cardiomegaly. Diffuse interstitial opacity with Kerley lines and vascular pedicle widening. No visible effusion or pneumothorax. IMPRESSION: CHF. Electronically Signed   By: Monte Fantasia M.D.   On: 07/03/2020 06:17    Microbiology: Recent Results (from the past 240 hour(s))  Resp Panel by RT-PCR (Flu A&B, Covid) Nasopharyngeal Swab     Status: Abnormal   Collection Time: 07/20/20  4:25 AM   Specimen: Nasopharyngeal Swab; Nasopharyngeal(NP) swabs in vial transport medium  Result Value Ref Range Status   SARS Coronavirus 2 by RT PCR POSITIVE (A) NEGATIVE Final    Comment: RESULT CALLED TO, READ BACK BY AND VERIFIED WITH: Sappelt,J '@0605'$  by MATTHEWS,B. 6.13.22 (NOTE) SARS-CoV-2 target nucleic acids are DETECTED.  The SARS-CoV-2 RNA is generally detectable in upper respiratory specimens during the acute phase of infection. Positive results are indicative of the presence of the identified virus, but do not rule out bacterial infection or co-infection with other pathogens not detected by the test. Clinical correlation with patient history and other diagnostic information is necessary to determine patient infection status. The expected result is Negative.  Fact Sheet for Patients: EntrepreneurPulse.com.au  Fact  Sheet for Healthcare Providers: IncredibleEmployment.be  This test is not yet approved or cleared by the [place] and  has been authorized for detection and/or diagnosis of SARS-CoV-2 by FDA under an Emergency Use Authorization (EUA).  This EUA will remain in effect (meaning this test c an be used) for the duration of  the COVID-19 declaration under Section 564(b)(1) of the Act, 21 U.S.C. section 360bbb-3(b)(1), unless the authorization is terminated or revoked sooner.     Influenza A by PCR NEGATIVE NEGATIVE Final   Influenza B by PCR NEGATIVE  NEGATIVE Final    Comment: (NOTE) The Xpert Xpress SARS-CoV-2/FLU/RSV plus assay is intended as an aid in the diagnosis of influenza from Nasopharyngeal swab specimens and should not be used as a sole basis for treatment. Nasal washings and aspirates are unacceptable for Xpert Xpress SARS-CoV-2/FLU/RSV testing.  Fact Sheet for Patients: EntrepreneurPulse.com.au  Fact Sheet for Healthcare Providers: IncredibleEmployment.be  This test is not yet approved or cleared by the [place] FDA and has been authorized for detection and/or diagnosis of SARS-CoV-2 by FDA under an Emergency Use Authorization (EUA). This EUA will remain in effect (meaning this test can be used) for the duration of the COVID-19 declaration under Section 564(b)(1) of the Act, 21 U.S.C. section 360bbb-3(b)(1), unless the authorization is terminated or revoked.  Performed at [facility], [address] [place], [place] [address]      Labs: Basic Metabolic Panel: Recent Labs  Lab 07/27/20 1823 07/28/20 0755  NA 137 137  K 5.8* 6.2*  CL 98 99  CO2 21* 22  GLUCOSE 87 77  BUN 86* 92*  CREATININE 13.13* 13.99*  CALCIUM 6.6* 6.9*  MG 2.3 2.0  PHOS  --  8.5*   Liver Function Tests: Recent Labs  Lab 07/27/20 1823 07/28/20 0755  AST 20 16  ALT 16 14  ALKPHOS 59 57  BILITOT 0.7 0.9  PROT 6.6 6.2*  ALBUMIN 3.5 3.3*   CBC: Recent Labs  Lab 07/27/20 1823 07/28/20 0755  WBC 9.1 13.3*  NEUTROABS 6.7  --   HGB 9.0* 9.8*  HCT 28.0* 30.0*  MCV 104.5* 104.2*  PLT 203 226   BNP (last 3 results) Recent Labs    07/03/20 0618 07/20/20 0248 07/27/20 1857  BNP 2,185.0* 1,637.0* 2,631.0*     Signed:  Barton Dubois MD.  [facility] 07/28/2020, 4:22 PM

## 2020-07-28 NOTE — ED Notes (Signed)
Pt requests something for cough

## 2020-07-28 NOTE — Progress Notes (Signed)
We were called to see this ESRD pt from [facility] who was unable to get to HD yesterday due to covid positive status-  last HD was 6/17.  He is now 8 days from his positive test   Dialysis Access: LUE AVG   Dialysis:  [facility]  on MWF  4h 68mn  98 kg   1K/2.25 bath  Hep 1000+ 500u/hr  LUE AVG  300/500   - prosthetic weighs 1.6 kg  - Epogen 1000   Units IV/HD    Will plan for HD today off schedule-  tomorrow will be 9 days from his positive test-  not sure what transportation rule is-  not sure why it was not an issue for him to get to OP HD on Friday 6/17 ?    KLouis Meckel

## 2020-08-28 ENCOUNTER — Other Ambulatory Visit: Payer: Self-pay

## 2020-08-28 ENCOUNTER — Emergency Department (HOSPITAL_COMMUNITY): Payer: Medicaid Other

## 2020-08-28 ENCOUNTER — Emergency Department (HOSPITAL_COMMUNITY)
Admission: EM | Admit: 2020-08-28 | Discharge: 2020-08-28 | Disposition: A | Discharge status: Home / Self Care | Payer: Medicaid Other | Attending: Emergency Medicine | Admitting: Emergency Medicine

## 2020-08-28 DIAGNOSIS — E1122 Type 2 diabetes mellitus with diabetic chronic kidney disease: Secondary | ICD-10-CM | POA: Insufficient documentation

## 2020-08-28 DIAGNOSIS — I4891 Unspecified atrial fibrillation: Secondary | ICD-10-CM | POA: Insufficient documentation

## 2020-08-28 DIAGNOSIS — I5043 Acute on chronic combined systolic (congestive) and diastolic (congestive) heart failure: Secondary | ICD-10-CM | POA: Diagnosis not present

## 2020-08-28 DIAGNOSIS — Z7982 Long term (current) use of aspirin: Secondary | ICD-10-CM | POA: Diagnosis not present

## 2020-08-28 DIAGNOSIS — R531 Weakness: Secondary | ICD-10-CM | POA: Insufficient documentation

## 2020-08-28 DIAGNOSIS — Z9115 Patient's noncompliance with renal dialysis: Secondary | ICD-10-CM

## 2020-08-28 DIAGNOSIS — F17210 Nicotine dependence, cigarettes, uncomplicated: Secondary | ICD-10-CM | POA: Diagnosis not present

## 2020-08-28 DIAGNOSIS — Z8616 Personal history of COVID-19: Secondary | ICD-10-CM | POA: Insufficient documentation

## 2020-08-28 DIAGNOSIS — N186 End stage renal disease: Secondary | ICD-10-CM | POA: Diagnosis not present

## 2020-08-28 DIAGNOSIS — R0602 Shortness of breath: Secondary | ICD-10-CM | POA: Diagnosis not present

## 2020-08-28 DIAGNOSIS — E875 Hyperkalemia: Secondary | ICD-10-CM | POA: Diagnosis not present

## 2020-08-28 DIAGNOSIS — Z7901 Long term (current) use of anticoagulants: Secondary | ICD-10-CM | POA: Diagnosis not present

## 2020-08-28 LAB — CBC WITH DIFFERENTIAL/PLATELET
Abs Immature Granulocytes: 0.04 10*3/uL (ref 0.00–0.07)
Basophils Absolute: 0.1 10*3/uL (ref 0.0–0.1)
Basophils Relative: 0 %
Eosinophils Absolute: 0.2 10*3/uL (ref 0.0–0.5)
Eosinophils Relative: 1 %
HCT: 26.1 % — ABNORMAL LOW (ref 39.0–52.0)
Hemoglobin: 8.3 g/dL — ABNORMAL LOW (ref 13.0–17.0)
Immature Granulocytes: 0 %
Lymphocytes Relative: 10 %
Lymphs Abs: 1.1 10*3/uL (ref 0.7–4.0)
MCH: 34.6 pg — ABNORMAL HIGH (ref 26.0–34.0)
MCHC: 31.8 g/dL (ref 30.0–36.0)
MCV: 108.8 fL — ABNORMAL HIGH (ref 80.0–100.0)
Monocytes Absolute: 0.6 10*3/uL (ref 0.1–1.0)
Monocytes Relative: 5 %
Neutro Abs: 9.2 10*3/uL — ABNORMAL HIGH (ref 1.7–7.7)
Neutrophils Relative %: 84 %
Platelets: 278 10*3/uL (ref 150–400)
RBC: 2.4 MIL/uL — ABNORMAL LOW (ref 4.22–5.81)
RDW: 14 % (ref 11.5–15.5)
WBC: 11.2 10*3/uL — ABNORMAL HIGH (ref 4.0–10.5)
nRBC: 0 % (ref 0.0–0.2)

## 2020-08-28 LAB — COMPREHENSIVE METABOLIC PANEL
ALT: 8 U/L (ref 0–44)
AST: 12 U/L — ABNORMAL LOW (ref 15–41)
Albumin: 3.3 g/dL — ABNORMAL LOW (ref 3.5–5.0)
Alkaline Phosphatase: 54 U/L (ref 38–126)
Anion gap: 11 (ref 5–15)
BUN: 35 mg/dL — ABNORMAL HIGH (ref 6–20)
CO2: 25 mmol/L (ref 22–32)
Calcium: 8.2 mg/dL — ABNORMAL LOW (ref 8.9–10.3)
Chloride: 102 mmol/L (ref 98–111)
Creatinine, Ser: 11.28 mg/dL — ABNORMAL HIGH (ref 0.61–1.24)
GFR, Estimated: 5 mL/min — ABNORMAL LOW (ref >60)
Glucose, Bld: 80 mg/dL (ref 70–99)
Potassium: 5.8 mmol/L — ABNORMAL HIGH (ref 3.5–5.1)
Sodium: 138 mmol/L (ref 135–145)
Total Bilirubin: 0.8 mg/dL (ref 0.3–1.2)
Total Protein: 6.5 g/dL (ref 6.5–8.1)

## 2020-08-28 LAB — POC OCCULT BLOOD, ED: Fecal Occult Bld: NEGATIVE

## 2020-08-28 IMAGING — DX DG CHEST 1V PORT
1 series · 1 of 1 positions shown · non-contrast
Comparison: [DATE]

CLINICAL DATA: Shortness of breath

EXAM:
PORTABLE CHEST 1 VIEW

[chest ap]
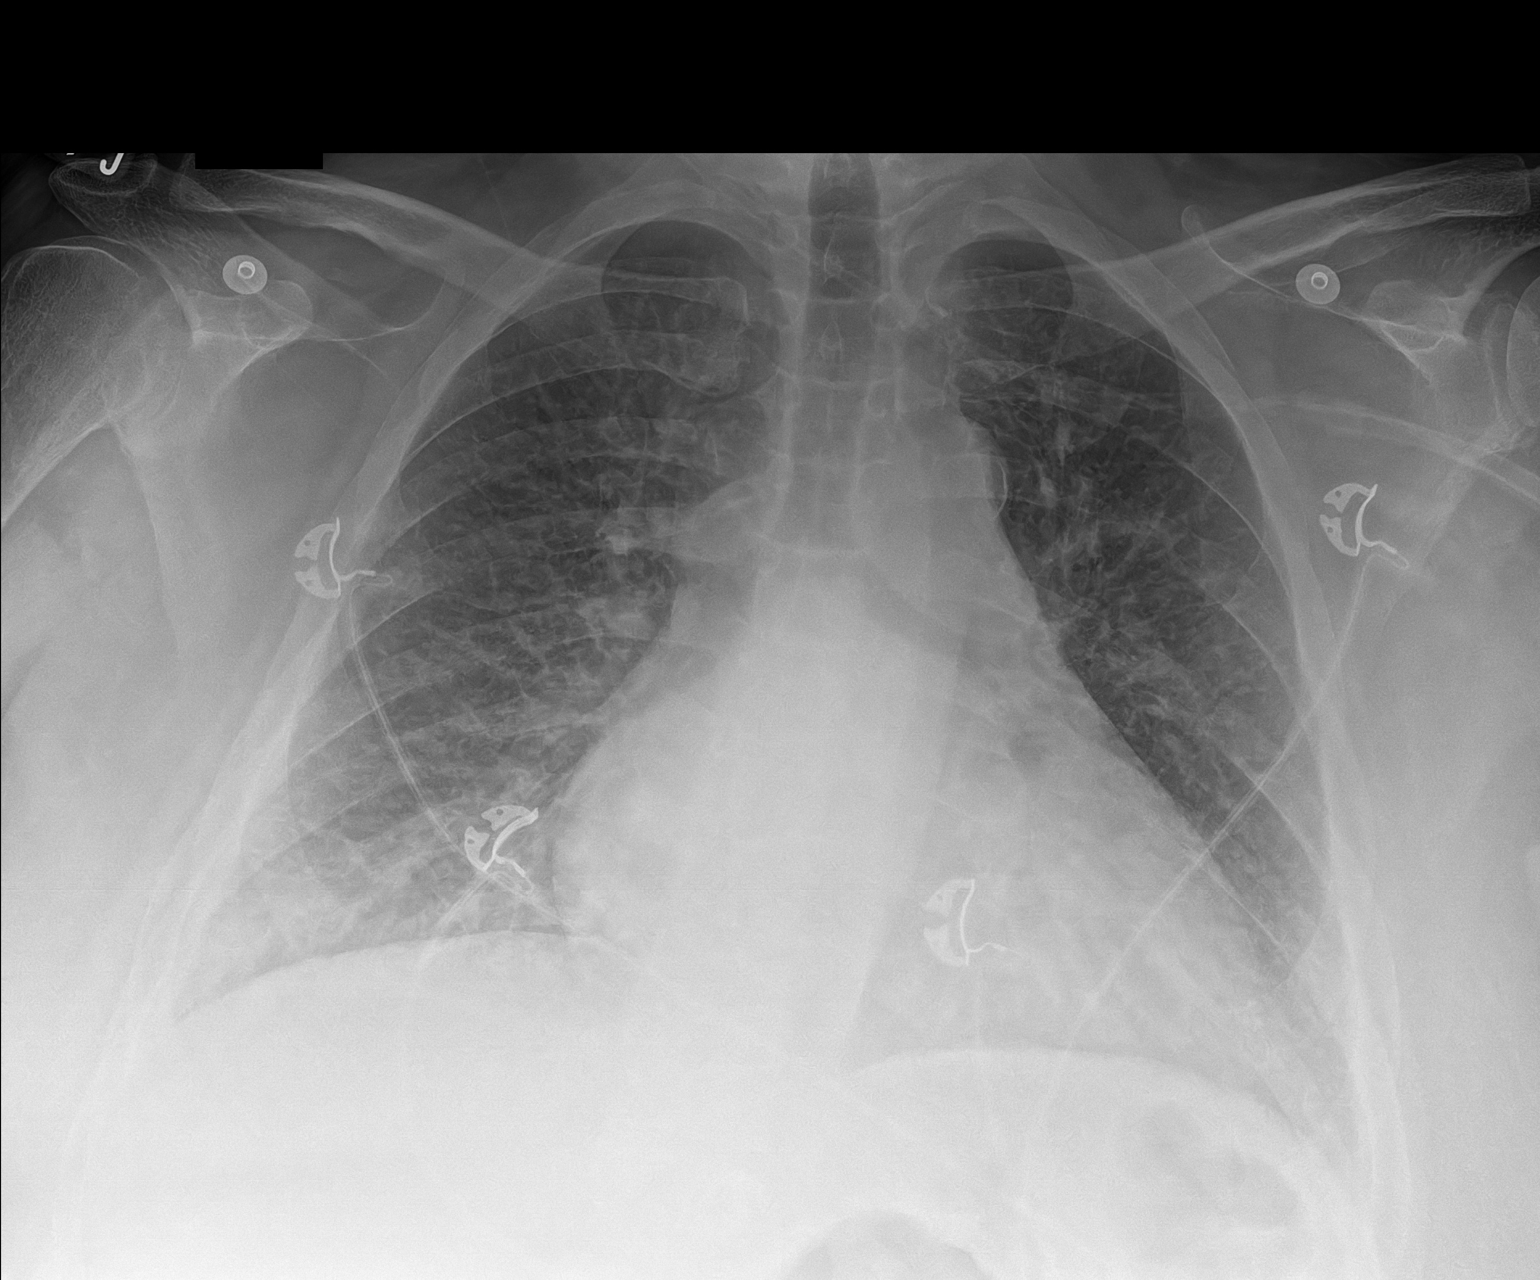

[1 of 1 positions shown; findings below may reference images not displayed]

FINDINGS: Stable cardiomegaly. Atherosclerotic calcification of the aortic
knob. Mild diffuse interstitial prominence. Streaky bibasilar
opacities. No pleural effusion or pneumothorax.
IMPRESSION: Cardiomegaly with mild diffuse interstitial prominence suggesting
mild edema. Streaky bibasilar opacities, likely atelectasis.

## 2020-08-28 MED ORDER — LIDOCAINE-PRILOCAINE 2.5-2.5 % EX CREA: 1.0000 "application " | TOPICAL_CREAM | CUTANEOUS | Status: DC | PRN

## 2020-08-28 MED ORDER — SODIUM CHLORIDE 0.9 % IV SOLN: 100.0000 mL | INTRAVENOUS | Status: DC | PRN

## 2020-08-28 MED ORDER — CHLORHEXIDINE GLUCONATE CLOTH 2 % EX PADS: 6.0000 | MEDICATED_PAD | Freq: Every day | CUTANEOUS | Status: DC

## 2020-08-28 MED ORDER — HEPARIN SODIUM (PORCINE) 1000 UNIT/ML IJ SOLN
INTRAMUSCULAR | Status: AC
  Administered 2020-08-28: 2100 [IU] via INTRAVENOUS_CENTRAL
  Filled 2020-08-28: qty 3

## 2020-08-28 MED ORDER — PENTAFLUOROPROP-TETRAFLUOROETH EX AERO: 1.0000 "application " | INHALATION_SPRAY | CUTANEOUS | Status: DC | PRN

## 2020-08-28 MED ORDER — DARBEPOETIN ALFA 150 MCG/0.3ML IJ SOSY
PREFILLED_SYRINGE | INTRAMUSCULAR | Status: AC
  Administered 2020-08-28: 150 ug via INTRAVENOUS
  Filled 2020-08-28: qty 0.3

## 2020-08-28 MED ORDER — HEPARIN SODIUM (PORCINE) 1000 UNIT/ML DIALYSIS: 20.0000 [IU]/kg | INTRAMUSCULAR | Status: DC | PRN

## 2020-08-28 MED ORDER — LIDOCAINE HCL (PF) 1 % IJ SOLN: 5.0000 mL | INTRAMUSCULAR | Status: DC | PRN

## 2020-08-28 MED ORDER — DARBEPOETIN ALFA 150 MCG/0.3ML IJ SOSY
150.0000 ug | PREFILLED_SYRINGE | INTRAMUSCULAR | Status: DC
  Filled 2020-08-28: qty 0.3

## 2020-08-28 NOTE — Procedures (Signed)
   HEMODIALYSIS TREATMENT NOTE:  4 hour low-heparin HD treatment completed via left upper arm AVG.  Difficult cannulation; 16g needles were utilized.  Able to achieve Qb 350 with stable VP and AP.  4-5 L goal not met:  Pt signed off of HD AMA after 3.5 hours for c/o leg cramps unrelieved by interruption in UF and NS bolus.  Dr. Moshe Cipro was notified.  Net UF 3.1 liters.  All blood was returned and hemostasis was achieved in 20 minutes.  Weaned off of O2, saturating 97% on room air.  Taxi voucher provided by [facility] team to be used to transport patient home.  Stable for discharge per Dr. Moshe Cipro.  Rockwell Alexandria, RN

## 2020-08-28 NOTE — TOC Progression Note (Signed)
Transition of Care [facility]) - Progression Note    Patient Details  Name: Brendan Campbell MRN: [id number] Date of Birth: 11-Jun-1970  Transition of Care [facility]) CM/SW Contact  Boneta Lucks, RN Phone Number: 08/28/2020, 4:46 PM  Clinical Narrative:   Patient is in the ED,needing dialysis. Patient will get dialysis and then discharge. Patient is needing a ride home. TOC arranged [employer] from [place]. Levada Dy RN has the signed voucher. She has agreed to call them to schedule a time when patient is ending his treatment.   Expected Discharge Plan: Home/Self Care Barriers to Discharge: Barriers Resolved  Expected Discharge Plan and Services Expected Discharge Plan: Home/Self Care                                               Social Determinants of Health (SDOH) Interventions    Readmission Risk Interventions Readmission Risk Prevention Plan 07/21/2020  Transportation Screening Complete  PCP or Specialist Appt within 3-5 Days Complete  HRI or [place] Complete  Social Work Consult for [place] Planning/Counseling Complete  Palliative Care Screening Not Applicable  Medication Review [employer]) Complete  Some recent data might be hidden

## 2020-08-28 NOTE — ED Triage Notes (Signed)
Pt is a dialysis pt who is c.o gerneral weakness and fatigue.  Pt was supposed to go to dialysis today but felt to weak to go.

## 2020-08-28 NOTE — Progress Notes (Signed)
I was called to see this ESRD pt from [facility] who was unable to get to HD today due to being too weak-  last HD was 7/20.  His K is slightly high at 5.8 and he is hypoxic-  hgb just a little lower than usual   Dialysis Access: LUE AVG   Dialysis:  [facility]  on MWF  4h 42mn  98 kg   1K/2.25 bath  Hep 1000+ 500u/hr  LUE AVG  300/500   - prosthetic weighs 1.6 kg  - Epogen 1000   Units IV/HD     Will plan for HD today in the ER-  our dialysis nurse can get to him at 4:30 PM-  if all goes well, he could be discharged home after HD tonight   KLouis Meckel

## 2020-08-28 NOTE — ED Provider Notes (Signed)
[facility] Provider Note   CSN: [id number] Arrival date & time: 08/28/20  1052     History Chief Complaint  Patient presents with   Weakness    Brendan Campbell is a 50 y.o. male with a history most significant for end-stage renal disease on dialysis, Monday Wednesday Friday who missed Wednesdays and today's treatment, also history of CHF, diabetes and had a hospitalization last month secondary to COVID-19 pneumonia, also had hyperkalemia and hypocalcemia during that admission.  His COVID was diagnosed on June 13.  Who presents today with generalized weakness which started yesterday evening.  He also endorses some mild increased shortness of breath and "feels funny in his lungs".  He denies chest pain but is concerned about possibility of pneumonia.  He has generalized weakness without any focal weakness.  He attempted to walk to the bathroom this morning when he first woke and felt he was weak enough to fall but did not.  He is on 2 L of oxygen 24/7 secondary to CHF, had increased shortness of breath this morning but also endorsed he was not wearing his oxygen when trying to walk to the bathroom.  He denies fevers or chills, no abdominal pain, no nausea or vomiting.  He does endorse a reduced appetite as it has just been to hot and has not felt like eating.  The history is provided by the patient.      Past Medical History:  Diagnosis Date   Anemia    Anxiety    Blood transfusion without reported diagnosis    CHF (congestive heart failure) ([place])    a. EF 25% by echo in 07/2019   Chronic kidney disease    Depression    Diabetes mellitus without complication ([place])    End stage renal disease ([place])    M/W/F [facility]   Hyperlipidemia    Neuropathy    Peripheral vascular disease (HCC)    PTSD (post-traumatic stress disorder)     Patient Active Problem List   Diagnosis Date Noted   Chronic systolic CHF (congestive heart failure) ([place]) 07/27/2020   Volume  overload 07/20/2020   COVID-19 virus infection 07/20/2020   Acute on chronic respiratory failure with hypoxia ([place]) 07/03/2020   Acute on chronic combined systolic and diastolic CHF (congestive heart failure) ([place]) 04/08/2020   Left hemiparesis ([place]) 04/08/2020   Lumbar spondylosis 04/08/2020   Atrial fibrillation with RVR ([place]) 04/08/2020   Elevated troponin 04/08/2020   Pain in limb 12/29/2019   Headache disorder 12/29/2019   Pain in left arm 12/29/2019   Cervical radiculopathy 12/29/2019   Acute congestive heart failure ([place]) 12/13/2019   Macrocytic anemia 09/25/2019   Paresthesia 09/24/2019   Hypocalcemia    Chronic atrial fibrillation ([place])    Hyperparathyroidism ([place])    Cardiac arrest ([place]) 06/07/2019   Neuropathy    Peripheral vascular disease (HCC)    Prolonged QT interval    Leukocytosis    Atypical pneumonia    Acute pulmonary edema ([place])    ESRD on hemodialysis ([place])    Hyperkalemia 10/05/2018   Weakness 10/05/2018   ESRD (end stage renal disease) ([place]) 05/10/2016   Spondylosis of lumbar spine 03/08/2016   Lumbar radiculopathy 03/08/2016   S/P below knee amputation, right ([place]) 03/05/2015   Type 2 diabetes mellitus with diabetic chronic kidney disease ([place]) 03/03/2015   Hyperlipidemia 03/03/2015   Low back pain 03/03/2015    Past Surgical History:  Procedure Laterality Date   A/V FISTULAGRAM  N/A 10/09/2018   Procedure: A/V FISTULAGRAM;  Surgeon: Serafina Mitchell, MD;  Location: [facility];  Service: Cardiovascular;  Laterality: N/A;   AV FISTULA PLACEMENT Left 09/22/2016   Procedure: CREATION OF LEFT ARM ARTERIOVENOUS (AV) FISTULA;  Surgeon: Waynetta Sandy, MD;  Location: [facility];  Service: Vascular;  Laterality: Left;   AV FISTULA PLACEMENT Left 10/31/2017   Procedure: INSERTION OF ARTERIOVENOUS (AV) GORE-TEX 4-42m STETCH GRAFT LEFT ARM;  Surgeon: CWaynetta Sandy MD;  [facility]  Service: Vascular;  Laterality: Left;   [place] 08/17/2017   Procedure: SECOND STAGE BASILIC VEIN TRANSPOSITION LEFT ARM;  Surgeon: CWaynetta Sandy MD;  [facility]  Service: Vascular;  Laterality: Left;   BELOW KNEE LEG AMPUTATION Right    CARDIOVERSION N/A 02/19/2019   Procedure: CARDIOVERSION;  Surgeon: HPixie Casino MD;  [facility];  Service: Cardiovascular;  Laterality: N/A;   CHOLECYSTECTOMY     FOOT SURGERY     IR FLUORO GUIDE CV LINE RIGHT  05/16/2016   IR FLUORO GUIDE CV LINE RIGHT  02/16/2019   IR REMOVAL TUN CV CATH W/O FL  05/16/2016   IR REMOVAL TUN CV CATH W/O FL  02/19/2019   IR THROMBECTOMY AV FISTULA W/THROMBOLYSIS/PTA INC/SHUNT/IMG LEFT Left 11/09/2018   IR THROMBECTOMY AV FISTULA W/THROMBOLYSIS/PTA INC/SHUNT/IMG LEFT Left 06/22/2019   IR [place] VASC ACCESS LEFT  11/09/2018   IR [place] VASC ACCESS LEFT  06/22/2019   IR [place] VASC ACCESS RIGHT  05/16/2016   IR [place] VASC ACCESS RIGHT  02/16/2019   PERIPHERAL VASCULAR BALLOON ANGIOPLASTY  10/09/2018   Procedure: PERIPHERAL VASCULAR BALLOON ANGIOPLASTY;  Surgeon: BSerafina Mitchell MD;  [facility];  Service: Cardiovascular;;   TEE WITHOUT CARDIOVERSION N/A 02/19/2019   Procedure: TRANSESOPHAGEAL ECHOCARDIOGRAM (TEE);  Surgeon: HPixie Casino MD;  [facility];  Service: Cardiovascular;  Laterality: N/A;       Family History  Problem Relation Age of Onset   Cancer Mother        lung   Diabetes Mother    Heart attack Father    Diabetes Father    Diabetes Sister     Social History   Tobacco Use   Smoking status: Some Days    Types: Cigarettes    Last attempt to quit: 09/03/2006    Years since quitting: 13.9   Smokeless tobacco: Never  Vaping Use   Vaping Use: Never used  Substance Use Topics   Alcohol use: No   Drug use: No    Home Medications Prior to Admission medications   Medication Sig Start Date End Date Taking? Authorizing Provider  albuterol (VENTOLIN HFA) 108 (90  Base) MCG/ACT inhaler Inhale 2 puffs into the lungs every 4 (four) hours as needed for wheezing or shortness of breath. 07/21/20   SManuella Ghazi Pratik D, DO  apixaban (ELIQUIS) 5 MG TABS tablet Take 1 tablet (5 mg total) by mouth 2 (two) times daily. 08/23/19   Strader, BFransisco Hertz PA-C  aspirin 81 MG chewable tablet Chew 81 mg by mouth daily.    [provider]  atorvastatin (LIPITOR) 10 MG tablet Take 1 tablet (10 mg total) by mouth every evening. 08/03/19   MBarton Dubois MD  butalbital-acetaminophen-caffeine (FIORICET) 5215-134-8667MG tablet Take 1 tablet by mouth every 6 (six) hours as needed for headache or migraine. 07/21/20   SManuella Ghazi Pratik D, DO  calcium acetate (PHOSLO) 667 MG  capsule Take 1 capsule (667 mg total) by mouth 3 (three) times daily with meals. 02/21/19   Donnamae Jude, MD  calcium carbonate (TUMS - DOSED IN MG ELEMENTAL CALCIUM) 500 MG chewable tablet Chew 4 tablets (800 mg of elemental calcium total) by mouth 2 (two) times daily. 08/03/19   Barton Dubois, MD  carvedilol (COREG) 3.125 MG tablet Take 1 tablet (3.125 mg total) by mouth 2 (two) times daily with a meal. 04/09/20 07/21/20  Shahmehdi, Valeria Batman, MD  DULoxetine (CYMBALTA) 30 MG capsule Take 30 mg by mouth 2 (two) times daily. 12/26/19   [provider]  guaiFENesin-dextromethorphan (ROBITUSSIN DM) 100-10 MG/5ML syrup Take 5 mLs by mouth every 6 (six) hours as needed for cough. 07/28/20   Barton Dubois, MD  LANTUS SOLOSTAR 100 UNIT/ML Solostar Pen Inject 10 Units into the skin at bedtime.  01/18/19   [provider]  lidocaine-prilocaine (EMLA) cream Apply 1 application topically See admin instructions. Applied three times weekly at dialysis on MWF 05/06/19   [provider]  multivitamin (RENA-VIT) TABS tablet Take 1 tablet by mouth daily. 08/04/18   [provider]  patiromer (VELTASSA) 8.4 g packet Take 1 packet (8.4 g total) by mouth 3 (three) times a week. On Tuesday, Thursday and Saturday  06/10/19   Kathie Dike, MD    Allergies    Tape and Other  Review of Systems   Review of Systems  Constitutional:  Positive for fatigue. Negative for chills and fever.  HENT:  Negative for congestion and sore throat.   Eyes: Negative.   Respiratory:  Positive for shortness of breath. Negative for chest tightness.   Cardiovascular:  Negative for chest pain and palpitations.  Gastrointestinal:  Negative for abdominal pain, nausea and vomiting.  Genitourinary: Negative.   Musculoskeletal:  Negative for arthralgias, joint swelling and neck pain.  Skin: Negative.  Negative for rash and wound.  Neurological:  Positive for weakness. Negative for dizziness, light-headedness, numbness and headaches.  Psychiatric/Behavioral: Negative.     Physical Exam Updated Vital Signs BP (!) 148/92   Pulse 92   Temp 99.1 F (37.3 C) (Oral)   Resp (!) 22   Ht '5\' 10"'$  (1.778 m)   Wt 104.3 kg   SpO2 96%   BMI 33.00 kg/m   Physical Exam Vitals and nursing note reviewed.  Constitutional:      Appearance: He is well-developed.  HENT:     Head: Normocephalic and atraumatic.  Eyes:     Conjunctiva/sclera: Conjunctivae normal.  Cardiovascular:     Rate and Rhythm: Normal rate and regular rhythm.     Heart sounds: Normal heart sounds.  Pulmonary:     Effort: Pulmonary effort is normal. No respiratory distress.     Breath sounds: Normal breath sounds. No wheezing or rhonchi.  Abdominal:     General: Bowel sounds are normal.     Palpations: Abdomen is soft.     Tenderness: There is no abdominal tenderness. There is no guarding.  Musculoskeletal:        General: Normal range of motion.     Cervical back: Normal range of motion.     Left lower leg: No edema.     Comments: Right bka  Skin:    General: Skin is warm and dry.  Neurological:     Mental Status: He is alert.    ED Results / Procedures / Treatments   Labs (all labs ordered are listed, but only abnormal results are  displayed) Labs Reviewed  CBC WITH DIFFERENTIAL/PLATELET - Abnormal; Notable for the following components:      Result Value   WBC 11.2 (*)    RBC 2.40 (*)    Hemoglobin 8.3 (*)    HCT 26.1 (*)    MCV 108.8 (*)    MCH 34.6 (*)    Neutro Abs 9.2 (*)    All other components within normal limits  COMPREHENSIVE METABOLIC PANEL - Abnormal; Notable for the following components:   Potassium 5.8 (*)    BUN 35 (*)    Creatinine, Ser 11.28 (*)    Calcium 8.2 (*)    Albumin 3.3 (*)    AST 12 (*)    GFR, Estimated 5 (*)    All other components within normal limits  POC OCCULT BLOOD, ED    EKG EKG Interpretation  Date/Time:  Friday August 28 2020 11:54:03 EDT Ventricular Rate:  92 PR Interval:  193 QRS Duration: 131 QT Interval:  415 QTC Calculation: 514 R Axis:   -35 Text Interpretation: Sinus rhythm Left bundle branch block since last tracing no significant change Confirmed by Daleen Bo [phone]) on 08/28/2020 1:12:39 PM  Radiology DG Chest Port 1 View  Result Date: 08/28/2020 CLINICAL DATA:  Shortness of breath EXAM: PORTABLE CHEST 1 VIEW COMPARISON:  07/27/2020 FINDINGS: Stable cardiomegaly. Atherosclerotic calcification of the aortic knob. Mild diffuse interstitial prominence. Streaky bibasilar opacities. No pleural effusion or pneumothorax. IMPRESSION: Cardiomegaly with mild diffuse interstitial prominence suggesting mild edema. Streaky bibasilar opacities, likely atelectasis. Electronically Signed   By: Davina Poke D.O.   On: 08/28/2020 12:12    Procedures Procedures   Medications Ordered in ED Medications  Chlorhexidine Gluconate Cloth 2 % PADS 6 each (has no administration in time range)  Darbepoetin Alfa (ARANESP) injection 150 mcg (has no administration in time range)    ED Course  I have reviewed the triage vital signs and the nursing notes.  Pertinent labs & imaging results that were available during my care of the patient were reviewed by me and considered  in my medical decision making (see chart for details).    MDM Rules/Calculators/A&P                           Labs and imaging reviewed.  Pertinence include chest x-ray suggesting mild diffuse interstitial edema, no pneumonia, potassium level which is elevated today at 5.8.  He also has a hemoglobin of 8.3 which is reduced from 9.8 from 1 month ago.  Hemoccult was performed and is negative.  X-ray findings and potassium level may purely be addressed via dialysis, call placed to discuss with nephrologist.  Discussed with Dr. Moshe Cipro who has arranged dialysis for pt here prior to dc home. TOC consult ordered as well to ensure pt will have access to transportation home after tx.   Discussed elevated potassium with Dr. Moshe Cipro - would not treat with meds, dialysis only.    4:31 PM Brendan Campbell, dialysis RN ready for pt at this time.  Will dispo from ED and transport for dialysis  Also discussed with Wells Guiles from [facility] team who will arrange transportation home once dialysis is completed. .    Final Clinical Impression(s) / ED Diagnoses Final diagnoses:  Noncompliance of patient with renal dialysis ([place])  Weakness  Hyperkalemia    Rx / DC Orders ED Discharge Orders     None        Landis Martins 08/28/20  [id number]    Daleen Bo, MD 08/28/20 [facility]

## 2020-10-20 ENCOUNTER — Encounter (HOSPITAL_COMMUNITY)
Admission: RE | Admit: 2020-10-20 | Discharge: 2020-10-20 | Disposition: A | Discharge status: Home / Self Care | Payer: Medicaid Other | Source: Ambulatory Visit | Attending: Ophthalmology | Admitting: Ophthalmology

## 2020-10-20 ENCOUNTER — Other Ambulatory Visit: Payer: Self-pay

## 2020-10-20 NOTE — H&P (Signed)
Surgical History & Physical  Patient Name: Brendan Campbell DOB: March 09, 1970  Surgery: Cataract extraction with intraocular lens implant phacoemulsification; Right Eye  Surgeon: Baruch Goldmann MD Surgery Date:  10-23-2020 Pre-Op Date:  10-15-2020  HPI: A 61 Yr. old male patient PT is present for cataract evaluation. The patient complains of difficulty when recognizing people, which began 3 years ago. Both eyes are affected. The episode is constant. The condition's severity is worsening. The complaint is associated with blurry vision/cloudy and glare. Pt states glare OD is blinding. Symptoms are negatively affecting pt's quality of life. Pt does not use any eye drops. Pt denies any increase in floaters. HPI was performed by Baruch Goldmann .  Medical History: Back Pain, Neuropathy Diabetes - DM Type 2 High Blood Pressure LDL  Review of Systems Genitourinary Dialysis Musculoskeletal Joint Ache All recorded systems are negative except as noted above.  Social   Former smoker   Medication Vitamin D, Cholesterol medication, Blood thinner, Lantus,   Sx/Procedures Retina Laser,   Drug Allergies   NKDA  History & Physical: Heent: Cataract, Right Eye NECK: supple without bruits LUNGS: lungs clear to auscultation CV: regular rate and rhythm Abdomen: soft and non-tender  Impression & Plan: Assessment: 1.  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  DERMATOCHALASIS; Right Upper Lid, Left Upper Lid (H02.831, H02.834) 3.  Pinguecula; Both Eyes (H11.153) 4.  COMBINED FORMS AGE RELATED CATARACT; Left Eye (H25.812) 5.  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  Diabetes Type 2 No retinopathy (E11.9)  Plan: 1.  Cataract accounts for the patient's decreased vision. This visual impairment is not correctable with a tolerable change in glasses or contact lenses. Cataract surgery with an implantation of a new lens should significantly improve the visual and functional  status of the patient. Discussed all risks, benefits, alternatives, and potential complications. Discussed the procedures and recovery. Patient desires to have surgery. A-scan ordered and performed today for intra-ocular lens calculations. The surgery will be performed in order to improve vision for driving, reading, and for eye examinations. Recommend phacoemulsification with intra-ocular lens. Recommend Dextenza for post-operative pain and inflammation. Right Eye. Dilates poorly - shugacaine by protocol. [facility]. Malyugin Ring. Omidira.  2.  Asymptomatic, recommend observation for now. Findings, prognosis and treatment options reviewed.  3.  Observe; Artificial tears as needed for irritation.  4.  will address after phaco PCIOL OD.  5.  Asteroid hyalosis is a benign degenerative eye condition marked by a buildup of calcium and lipids (fats) in the fluid between your retina and lens, called the vitreous humor. It may cause some floaters.  6.  Stressed importance of blood sugar and blood pressure control, and also yearly eye examinations. Discussed the need for ongoing proactive ocular exams and treatment, hopefully before visual symptoms develop.

## 2020-10-20 NOTE — Pre-Procedure Instructions (Signed)
Attempted to call patient for preop phone call and there was ni answer and no voicemail.

## 2020-10-23 ENCOUNTER — Ambulatory Visit (HOSPITAL_COMMUNITY): Payer: Medicaid Other | Admitting: Anesthesiology

## 2020-10-23 ENCOUNTER — Ambulatory Visit (HOSPITAL_COMMUNITY)
Admission: RE | Admit: 2020-10-23 | Discharge: 2020-10-23 | Disposition: A | Discharge status: Home / Self Care | Payer: Medicaid Other | Attending: Ophthalmology | Admitting: Ophthalmology

## 2020-10-23 ENCOUNTER — Encounter (HOSPITAL_COMMUNITY)
Admission: RE | Disposition: A | Discharge status: Home / Self Care | Payer: Self-pay | Source: Home / Self Care | Attending: Ophthalmology

## 2020-10-23 ENCOUNTER — Encounter (HOSPITAL_COMMUNITY): Payer: Self-pay | Admitting: Ophthalmology

## 2020-10-23 DIAGNOSIS — Z87891 Personal history of nicotine dependence: Secondary | ICD-10-CM | POA: Insufficient documentation

## 2020-10-23 DIAGNOSIS — H11153 Pinguecula, bilateral: Secondary | ICD-10-CM | POA: Insufficient documentation

## 2020-10-23 DIAGNOSIS — E1140 Type 2 diabetes mellitus with diabetic neuropathy, unspecified: Secondary | ICD-10-CM | POA: Insufficient documentation

## 2020-10-23 DIAGNOSIS — H2521 Age-related cataract, morgagnian type, right eye: Secondary | ICD-10-CM | POA: Insufficient documentation

## 2020-10-23 DIAGNOSIS — E1136 Type 2 diabetes mellitus with diabetic cataract: Secondary | ICD-10-CM | POA: Insufficient documentation

## 2020-10-23 DIAGNOSIS — H25812 Combined forms of age-related cataract, left eye: Secondary | ICD-10-CM | POA: Diagnosis not present

## 2020-10-23 DIAGNOSIS — H2181 Floppy iris syndrome: Secondary | ICD-10-CM | POA: Insufficient documentation

## 2020-10-23 DIAGNOSIS — H02834 Dermatochalasis of left upper eyelid: Secondary | ICD-10-CM | POA: Diagnosis not present

## 2020-10-23 DIAGNOSIS — H02831 Dermatochalasis of right upper eyelid: Secondary | ICD-10-CM | POA: Insufficient documentation

## 2020-10-23 DIAGNOSIS — H4322 Crystalline deposits in vitreous body, left eye: Secondary | ICD-10-CM | POA: Diagnosis not present

## 2020-10-23 HISTORY — PX: CATARACT EXTRACTION W/PHACO: SHX586

## 2020-10-23 LAB — GLUCOSE, CAPILLARY
Glucose-Capillary: 98 mg/dL (ref 70–99)
Glucose-Capillary: 88 mg/dL (ref 70–99)

## 2020-10-23 SURGERY — PHACOEMULSIFICATION, CATARACT, WITH IOL INSERTION
Anesthesia: Monitor Anesthesia Care | Site: Eye | Laterality: Right

## 2020-10-23 MED ORDER — MIDAZOLAM HCL 5 MG/5ML IJ SOLN
INTRAMUSCULAR | Status: DC | PRN
  Administered 2020-10-23: 2 mg via INTRAVENOUS

## 2020-10-23 MED ORDER — IPRATROPIUM-ALBUTEROL 0.5-2.5 (3) MG/3ML IN SOLN
3.0000 mL | Freq: Once | RESPIRATORY_TRACT | Status: AC
  Administered 2020-10-23: 3 mL via RESPIRATORY_TRACT

## 2020-10-23 MED ORDER — TETRACAINE HCL 0.5 % OP SOLN
1.0000 [drp] | OPHTHALMIC | Status: AC | PRN
  Administered 2020-10-23 (×3): 1 [drp] via OPHTHALMIC

## 2020-10-23 MED ORDER — BSS IO SOLN
INTRAOCULAR | Status: DC | PRN
  Administered 2020-10-23: 15 mL via INTRAOCULAR

## 2020-10-23 MED ORDER — SODIUM HYALURONATE 10 MG/ML IO SOLUTION
PREFILLED_SYRINGE | INTRAOCULAR | Status: DC | PRN
  Administered 2020-10-23: 0.85 mL via INTRAOCULAR

## 2020-10-23 MED ORDER — SODIUM CHLORIDE 0.9 % IV SOLN: Freq: Once | INTRAVENOUS | Status: AC

## 2020-10-23 MED ORDER — LIDOCAINE HCL 3.5 % OP GEL
1.0000 "application " | Freq: Once | OPHTHALMIC | Status: AC
  Administered 2020-10-23: 1 via OPHTHALMIC

## 2020-10-23 MED ORDER — TRYPAN BLUE 0.06 % OP SOLN
OPHTHALMIC | Status: DC | PRN
  Administered 2020-10-23: 0.5 mL via INTRAOCULAR

## 2020-10-23 MED ORDER — SODIUM HYALURONATE 23MG/ML IO SOSY
PREFILLED_SYRINGE | INTRAOCULAR | Status: DC | PRN
  Administered 2020-10-23: 0.6 mL via INTRAOCULAR

## 2020-10-23 MED ORDER — TROPICAMIDE 1 % OP SOLN
1.0000 [drp] | OPHTHALMIC | Status: AC
  Administered 2020-10-23 (×3): 1 [drp] via OPHTHALMIC

## 2020-10-23 MED ORDER — MIDAZOLAM HCL 2 MG/2ML IJ SOLN
INTRAMUSCULAR | Status: AC
  Filled 2020-10-23: qty 2

## 2020-10-23 MED ORDER — PHENYLEPHRINE-KETOROLAC 1-0.3 % IO SOLN
INTRAOCULAR | Status: AC
  Filled 2020-10-23: qty 4

## 2020-10-23 MED ORDER — SODIUM CHLORIDE 0.9 % IV SOLN: INTRAVENOUS | Status: DC | PRN

## 2020-10-23 MED ORDER — STERILE WATER FOR IRRIGATION IR SOLN
Status: DC | PRN
  Administered 2020-10-23: 250 mL

## 2020-10-23 MED ORDER — PHENYLEPHRINE HCL 2.5 % OP SOLN
1.0000 [drp] | OPHTHALMIC | Status: AC | PRN
  Administered 2020-10-23 (×3): 1 [drp] via OPHTHALMIC

## 2020-10-23 MED ORDER — POVIDONE-IODINE 5 % OP SOLN
OPHTHALMIC | Status: DC | PRN
  Administered 2020-10-23: 1 via OPHTHALMIC

## 2020-10-23 MED ORDER — IPRATROPIUM-ALBUTEROL 0.5-2.5 (3) MG/3ML IN SOLN
RESPIRATORY_TRACT | Status: AC
  Filled 2020-10-23: qty 3

## 2020-10-23 MED ORDER — LIDOCAINE HCL (PF) 1 % IJ SOLN
INTRAOCULAR | Status: DC | PRN
  Administered 2020-10-23: 1 mL via OPHTHALMIC

## 2020-10-23 MED ORDER — TRYPAN BLUE 0.06 % OP SOLN
OPHTHALMIC | Status: AC
  Filled 2020-10-23: qty 0.5

## 2020-10-23 MED ORDER — PHENYLEPHRINE-KETOROLAC 1-0.3 % IO SOLN
INTRAOCULAR | Status: DC | PRN
  Administered 2020-10-23: 500 mL via OPHTHALMIC

## 2020-10-23 MED ORDER — EPINEPHRINE PF 1 MG/ML IJ SOLN
INTRAMUSCULAR | Status: AC
  Filled 2020-10-23: qty 1

## 2020-10-23 MED ORDER — NEOMYCIN-POLYMYXIN-DEXAMETH 3.5-10000-0.1 OP SUSP
OPHTHALMIC | Status: DC | PRN
  Administered 2020-10-23: 1 [drp] via OPHTHALMIC

## 2020-10-23 SURGICAL SUPPLY — 12 items
CLOTH BEACON ORANGE TIMEOUT ST (SAFETY) ×2 IMPLANT
EYE SHIELD UNIVERSAL CLEAR (GAUZE/BANDAGES/DRESSINGS) ×2 IMPLANT
GLOVE SURG UNDER POLY LF SZ6.5 (GLOVE) ×2 IMPLANT
GLOVE SURG UNDER POLY LF SZ7 (GLOVE) ×2 IMPLANT
NEEDLE HYPO 18GX1.5 BLUNT FILL (NEEDLE) ×2 IMPLANT
PAD ARMBOARD 7.5X6 YLW CONV (MISCELLANEOUS) ×2 IMPLANT
RING MALYGIN 7.0 (MISCELLANEOUS) IMPLANT
RayOne EMV US (Intraocular Lens) ×2 IMPLANT
SYR TB 1ML LL NO SAFETY (SYRINGE) ×2 IMPLANT
TAPE SURG TRANSPORE 1 IN (GAUZE/BANDAGES/DRESSINGS) ×1 IMPLANT
TAPE SURGICAL TRANSPORE 1 IN (GAUZE/BANDAGES/DRESSINGS) ×1
WATER STERILE IRR 250ML POUR (IV SOLUTION) ×2 IMPLANT

## 2020-10-23 NOTE — Progress Notes (Signed)
Pt is waiting on ride still at this time. Ride called and stated that she was on the way.

## 2020-10-23 NOTE — Progress Notes (Signed)
Pt 80% on RA in post op, pt placed on 4L [place] and 85%. Pt states he feels more SOB than usual. Dr. Modesta Messing at bedside.

## 2020-10-23 NOTE — Transfer of Care (Signed)
Immediate Anesthesia Transfer of Care Note  Patient: Brendan Campbell  Procedure(s) Performed: CATARACT EXTRACTION PHACO AND INTRAOCULAR LENS PLACEMENT (IOC) (Right: Eye)  Patient Location: PACU  Anesthesia Type:MAC  Level of Consciousness: awake  Airway & Oxygen Therapy: Patient Spontanous Breathing  Post-op Assessment: Report given to RN and Post -op Vital signs reviewed and stable  Post vital signs: Reviewed and stable  Last Vitals:  Vitals Value Taken Time  BP    Temp    Pulse    Resp    SpO2      Last Pain:  Vitals:   10/23/20 0727  PainSc: 0-No pain      Patients Stated Pain Goal: 5 (01/56/15 3794)  Complications: No notable events documented.

## 2020-10-23 NOTE — Anesthesia Postprocedure Evaluation (Addendum)
Anesthesia Post Note  Patient: VALERY CHANCE  Procedure(s) Performed: CATARACT EXTRACTION PHACO AND INTRAOCULAR LENS PLACEMENT (IOC) (Right: Eye)  Patient location during evaluation: Phase II Anesthesia Type: MAC Level of consciousness: awake and alert and oriented Pain management: pain level controlled Vital Signs Assessment: post-procedure vital signs reviewed and stable Respiratory status: spontaneous breathing, respiratory function stable and patient connected to nasal cannula oxygen Cardiovascular status: stable and blood pressure returned to baseline Postop Assessment: no apparent nausea or vomiting Anesthetic complications: no Comments: patient saurations in 80s in phase2, received duoneb nebulizer treatment,saturations above 90s on oxygen 2l, discharged home with home oxygen 2L.   No notable events documented.   Last Vitals:  Vitals:   10/23/20 0946 10/23/20 1111  BP:    Pulse:    Resp:    Temp:    SpO2: 99% 92%    Last Pain:  Vitals:   10/23/20 0933  TempSrc: Oral  PainSc: 0-No pain                 Kristofer Schaffert C Nephtali Docken

## 2020-10-23 NOTE — Discharge Instructions (Signed)
Please discharge patient when stable, will follow up today with Dr. Gabriella Guile at the [facility] office immediately following discharge.  Leave shield in place until visit.  All paperwork with discharge instructions will be given at the office.  [facility] Address:  [address]  [place], [place] [address]  

## 2020-10-23 NOTE — Progress Notes (Signed)
This RN has attempted to call ride multiple times with no answer. PT statse she is probably asleep. Pt is on 2L [place], states he uses oxygen at home prn. PT in NAD. VS.

## 2020-10-23 NOTE — Interval H&P Note (Signed)
History and Physical Interval Note:  10/23/2020 9:34 AM  Brendan Campbell  has presented today for surgery, with the diagnosis of Nuclear sclerotic cataract - Right eye.  The various methods of treatment have been discussed with the patient and family. After consideration of risks, benefits and other options for treatment, the patient has consented to  Procedure(s) with comments: CATARACT EXTRACTION PHACO AND INTRAOCULAR LENS PLACEMENT (IOC) (Right) - CDE 39.03 as a surgical intervention.  The patient's history has been reviewed, patient examined, no change in status, stable for surgery.  I have reviewed the patient's chart and labs.  Questions were answered to the patient's satisfaction.     Baruch Goldmann

## 2020-10-23 NOTE — Anesthesia Preprocedure Evaluation (Addendum)
Anesthesia Evaluation  Patient identified by MRN, date of birth, ID band Patient awake    Reviewed: Allergy & Precautions, NPO status , Patient's Chart, lab work & pertinent test results, reviewed documented beta blocker date and time   History of Anesthesia Complications Negative for: history of anesthetic complications  Airway Mallampati: II   Neck ROM: Full    Dental  (+) Dental Advisory Given, Missing, Chipped   Pulmonary shortness of breath (uses oxygen during night) and Long-Term Oxygen Therapy, Current Smoker and Patient abstained from smoking.,    Pulmonary exam normal breath sounds clear to auscultation       Cardiovascular Exercise Tolerance: Poor (-) hypertensionPt. on home beta blockers + Peripheral Vascular Disease and +CHF (EF 20-25%)  Normal cardiovascular exam Rhythm:Regular Rate:Normal  1. Left ventricular ejection fraction, by estimation, is 20 to 25%. The left ventricle has severely decreased function. The left ventricle demonstrates global hypokinesis. There is severe left ventricular hypertrophy. Left ventricular diastolic parameters are indeterminate.  2. Right ventricular systolic function is normal. The right ventricular size is normal.  3. The mitral valve is normal in structure. No evidence of mitral valve regurgitation. No evidence of mitral stenosis.  4. The aortic valve has an indeterminant number of cusps. There is moderate calcification of the aortic valve. There is moderate thickening of the aortic valve. Aortic valve regurgitation is not visualized. No aortic stenosis is present.  5. The inferior vena cava is normal in size with greater than 50% respiratory variability, suggesting right atrial pressure of 3 mmHg.    Neuro/Psych  Headaches, PSYCHIATRIC DISORDERS Anxiety Depression  Neuromuscular disease    GI/Hepatic negative GI ROS, Neg liver ROS,   Endo/Other  diabetes, Well Controlled, Type 2,  Oral Hypoglycemic Agents  Renal/GU ESRF and DialysisRenal disease (Last dialysis - 10/22/20)     Musculoskeletal  (+) Arthritis ,   Abdominal   Peds  Hematology  (+) anemia ,   Anesthesia Other Findings   Reproductive/Obstetrics negative OB ROS                           Anesthesia Physical Anesthesia Plan  ASA: 4  Anesthesia Plan: MAC   Post-op Pain Management:    Induction:   PONV Risk Score and Plan:   Airway Management Planned: Nasal Cannula and Natural Airway  Additional Equipment:   Intra-op Plan:   Post-operative Plan:   Informed Consent: I have reviewed the patients History and Physical, chart, labs and discussed the procedure including the risks, benefits and alternatives for the proposed anesthesia with the patient or authorized representative who has indicated his/her understanding and acceptance.     Dental advisory given  Plan Discussed with: CRNA and Surgeon  Anesthesia Plan Comments:         Anesthesia Quick Evaluation

## 2020-10-23 NOTE — Anesthesia Procedure Notes (Signed)
Procedure Name: MAC Date/Time: 10/23/2020 9:10 AM Performed by: Lieutenant Diego, CRNA Pre-anesthesia Checklist: Patient identified, Emergency Drugs available, Patient being monitored, Timeout performed and Suction available Patient Re-evaluated:Patient Re-evaluated prior to induction Oxygen Delivery Method: Nasal cannula Preoxygenation: Pre-oxygenation with 100% oxygen

## 2020-10-23 NOTE — Op Note (Signed)
Date of procedure: 10/23/20  Pre-operative diagnosis: Visually significant mature cataract, Right Eye; Poor Dilation, Right Eye (H25.21; H21.81)  Post-operative diagnosis: Visually significant mature cataract, Right Eye; Intra-operative Floppy Iris Syndrome, Right Eye  Procedure: Removal of cataract via phacoemulsification and insertion of intra-ocular lens Rayner [id number] +23.5D into the capsular bag of the Right Eye (CPT [phone])  Attending surgeon: Gerda Diss. Analise Glotfelty, MD, MA  Anesthesia: MAC, Topical Akten  Complications: None  Estimated Blood Loss: <34m (minimal)  Specimens: None  Implants: As above  Indications:  Visually significant cataract, Right Eye  Procedure:  The patient was seen and identified in the pre-operative area. The operative eye was identified and dilated.  The operative eye was marked.  Topical anesthesia was administered to the operative eye.     The patient was then to the operative suite and placed in the supine position.  A timeout was performed confirming the patient, procedure to be performed, and all other relevant information.   The patient's face was prepped and draped in the usual fashion for intra-ocular surgery.  A lid speculum was placed into the operative eye and the surgical microscope moved into place and focused.  Poor dilation of the iris was confirmed.  A superotemporal paracentesis was created using a 20 gauge paracentesis blade.  Vision Blue was used to stain the anterior capsule. Shugarcaine was injected into the anterior chamber.  Viscoelastic was injected into the anterior chamber.  A temporal clear-corneal main wound incision was created using a 2.480mmicrokeratome.  A Malyugin ring was introduced into the eye.  The trailing loop was too close to capture the iris, and the ring was removed.  A second ring was placed successfully on the iris..  A continuous curvilinear capsulorrhexis was initiated using an irrigating cystitome and completed using  capsulorrhexis forceps.  Hydrodissection and hydrodeliniation were performed.  Viscoelastic was injected into the anterior chamber.  A phacoemulsification handpiece and a chopper as a second instrument were used to remove the nucleus and epinucleus. The irrigation/aspiration handpiece was used to remove any remaining cortical material.   The capsular bag was reinflated with viscoelastic, checked, and found to be intact.  The intraocular lens was inserted into the capsular bag and dialed into place using a MaSurveyor, minerals The Malyugin ring was removed.  The irrigation/aspiration handpiece was used to remove any remaining viscoelastic.  The clear corneal wound and paracentesis wounds were then hydrated and checked with Weck-Cels to be watertight.  The lid-speculum and drape was removed, and the patient's face was cleaned with a wet and dry 4x4.  Maxitrol was instilled in the eye before a clear shield was taped over the eye. The patient was taken to the post-operative care unit in good condition, having tolerated the procedure well.  Post-Op Instructions: [facility] and will receive all other orders and instructions.

## 2020-10-23 NOTE — Progress Notes (Signed)
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 PT instructed to go to Dr. Doreatha Lew office

## 2020-10-23 NOTE — Interval H&P Note (Signed)
History and Physical Interval Note:  10/23/2020 8:55 AM  Brendan Campbell  has presented today for surgery, with the diagnosis of Nuclear sclerotic cataract - Right eye.  The various methods of treatment have been discussed with the patient and family. After consideration of risks, benefits and other options for treatment, the patient has consented to  Procedure(s) with comments: CATARACT EXTRACTION PHACO AND INTRAOCULAR LENS PLACEMENT (IOC) (Right) - right as a surgical intervention.  The patient's history has been reviewed, patient examined, no change in status, stable for surgery.  I have reviewed the patient's chart and labs.  Questions were answered to the patient's satisfaction.     Baruch Goldmann

## 2020-10-26 ENCOUNTER — Encounter (HOSPITAL_COMMUNITY): Payer: Self-pay | Admitting: Ophthalmology

## 2020-10-30 ENCOUNTER — Encounter (HOSPITAL_COMMUNITY): Payer: Self-pay | Admitting: Emergency Medicine

## 2020-10-30 ENCOUNTER — Emergency Department (HOSPITAL_COMMUNITY)
Admission: EM | Admit: 2020-10-30 | Discharge: 2020-10-30 | Disposition: A | Discharge status: Home / Self Care | Payer: Medicaid Other | Attending: Emergency Medicine | Admitting: Emergency Medicine

## 2020-10-30 DIAGNOSIS — Z7901 Long term (current) use of anticoagulants: Secondary | ICD-10-CM | POA: Insufficient documentation

## 2020-10-30 DIAGNOSIS — E1122 Type 2 diabetes mellitus with diabetic chronic kidney disease: Secondary | ICD-10-CM | POA: Diagnosis not present

## 2020-10-30 DIAGNOSIS — H5789 Other specified disorders of eye and adnexa: Secondary | ICD-10-CM | POA: Diagnosis not present

## 2020-10-30 DIAGNOSIS — Z7982 Long term (current) use of aspirin: Secondary | ICD-10-CM | POA: Diagnosis not present

## 2020-10-30 DIAGNOSIS — Z79899 Other long term (current) drug therapy: Secondary | ICD-10-CM | POA: Diagnosis not present

## 2020-10-30 DIAGNOSIS — Z8616 Personal history of COVID-19: Secondary | ICD-10-CM | POA: Diagnosis not present

## 2020-10-30 DIAGNOSIS — I5043 Acute on chronic combined systolic (congestive) and diastolic (congestive) heart failure: Secondary | ICD-10-CM | POA: Insufficient documentation

## 2020-10-30 DIAGNOSIS — H5710 Ocular pain, unspecified eye: Secondary | ICD-10-CM | POA: Diagnosis present

## 2020-10-30 DIAGNOSIS — Z992 Dependence on renal dialysis: Secondary | ICD-10-CM | POA: Diagnosis not present

## 2020-10-30 DIAGNOSIS — N186 End stage renal disease: Secondary | ICD-10-CM | POA: Insufficient documentation

## 2020-10-30 DIAGNOSIS — H109 Unspecified conjunctivitis: Secondary | ICD-10-CM

## 2020-10-30 MED ORDER — HYDROCODONE-ACETAMINOPHEN 5-325 MG PO TABS
2.0000 | ORAL_TABLET | Freq: Once | ORAL | Status: AC
  Administered 2020-10-30: 2 via ORAL
  Filled 2020-10-30: qty 2

## 2020-10-30 MED ORDER — ERYTHROMYCIN 5 MG/GM OP OINT
TOPICAL_OINTMENT | Freq: Once | OPHTHALMIC | Status: AC
  Filled 2020-10-30: qty 3.5

## 2020-10-30 NOTE — ED Triage Notes (Signed)
Pt had cataract surgery on right eye on 9/16. Pt states on Wednesday he had eye drainage. On Thursday he noticed that his eye was swollen shut. Pt had follow-up with eye MD on Thursday but wasn't able to make it. Tonight pt woke up with right eye burning and stinging.

## 2020-10-30 NOTE — ED Provider Notes (Signed)
[facility] EMERGENCY DEPARTMENT Provider Note   CSN: [id number] Arrival date & time: 10/30/20  [id number]     History Chief Complaint  Patient presents with   Eye Pain    Brendan Campbell is a 50 y.o. male.  Patient is a 50 year old male with extensive past medical history including diabetes, end-stage renal disease on hemodialysis, peripheral vascular disease, PTSD.  Patient recently underwent cataract surgery.  He presents today with complaints of eye pain and drainage.  He was scheduled for ophthalmology follow-up today, however did not make the appointment.  He woke up this evening with drainage from the eye and burning.  He denies loss of vision.  The history is provided by the patient.  Eye Pain This is a new problem. The current episode started 3 to 5 hours ago. The problem occurs constantly. The problem has been gradually worsening. Nothing aggravates the symptoms. Nothing relieves the symptoms.      Past Medical History:  Diagnosis Date   Anemia    Anxiety    Blood transfusion without reported diagnosis    CHF (congestive heart failure) ([place])    a. EF 25% by echo in 07/2019   Chronic kidney disease    Depression    Diabetes mellitus without complication ([place])    End stage renal disease ([place])    M/W/F [facility]   Hyperlipidemia    Neuropathy    Peripheral vascular disease (HCC)    PTSD (post-traumatic stress disorder)     Patient Active Problem List   Diagnosis Date Noted   Chronic systolic CHF (congestive heart failure) ([place]) 07/27/2020   Volume overload 07/20/2020   COVID-19 virus infection 07/20/2020   Acute on chronic respiratory failure with hypoxia ([place]) 07/03/2020   Acute on chronic combined systolic and diastolic CHF (congestive heart failure) ([place]) 04/08/2020   Left hemiparesis ([place]) 04/08/2020   Lumbar spondylosis 04/08/2020   Atrial fibrillation with RVR ([place]) 04/08/2020   Elevated troponin 04/08/2020   Pain in limb 12/29/2019   Headache disorder  12/29/2019   Pain in left arm 12/29/2019   Cervical radiculopathy 12/29/2019   Acute congestive heart failure ([place]) 12/13/2019   Macrocytic anemia 09/25/2019   Paresthesia 09/24/2019   Hypocalcemia    Chronic atrial fibrillation ([place])    Hyperparathyroidism ([place])    Cardiac arrest ([place]) 06/07/2019   Neuropathy    Peripheral vascular disease (HCC)    Prolonged QT interval    Leukocytosis    Atypical pneumonia    Acute pulmonary edema ([place])    ESRD on hemodialysis ([place])    Hyperkalemia 10/05/2018   Weakness 10/05/2018   ESRD (end stage renal disease) ([place]) 05/10/2016   Spondylosis of lumbar spine 03/08/2016   Lumbar radiculopathy 03/08/2016   S/P below knee amputation, right ([place]) 03/05/2015   Type 2 diabetes mellitus with diabetic chronic kidney disease ([place]) 03/03/2015   Hyperlipidemia 03/03/2015   Low back pain 03/03/2015    Past Surgical History:  Procedure Laterality Date   A/V FISTULAGRAM N/A 10/09/2018   Procedure: A/V FISTULAGRAM;  Surgeon: Serafina Mitchell, MD;  Location: [facility];  Service: Cardiovascular;  Laterality: N/A;   AV FISTULA PLACEMENT Left 09/22/2016   Procedure: CREATION OF LEFT ARM ARTERIOVENOUS (AV) FISTULA;  Surgeon: Waynetta Sandy, MD;  Location: [facility];  Service: Vascular;  Laterality: Left;   AV FISTULA PLACEMENT Left 10/31/2017   Procedure: INSERTION OF ARTERIOVENOUS (AV) GORE-TEX 4-56m STETCH GRAFT LEFT ARM;  Surgeon: CWaynetta Sandy MD;  Location: [facility];  Service: Vascular;  Laterality: Left;   BASCILIC VEIN TRANSPOSITION Left 08/17/2017   Procedure: SECOND STAGE BASILIC VEIN TRANSPOSITION LEFT ARM;  Surgeon: Waynetta Sandy, MD;  Location: [facility];  Service: Vascular;  Laterality: Left;   BELOW KNEE LEG AMPUTATION Right    CARDIOVERSION N/A 02/19/2019   Procedure: CARDIOVERSION;  Surgeon: [name], MD;  Location: [facility] ENDOSCOPY;  Service: Cardiovascular;  Laterality: N/A;   CATARACT EXTRACTION W/PHACO Right  10/23/2020   Procedure: CATARACT EXTRACTION PHACO AND INTRAOCULAR LENS PLACEMENT ([place]);  Surgeon: Baruch Goldmann, MD;  Location: [facility];  Service: Ophthalmology;  Laterality: Right;  CDE 39.03   CHOLECYSTECTOMY     FOOT SURGERY     IR FLUORO GUIDE CV LINE RIGHT  05/16/2016   IR FLUORO GUIDE CV LINE RIGHT  02/16/2019   IR REMOVAL TUN CV CATH W/O FL  05/16/2016   IR REMOVAL TUN CV CATH W/O FL  02/19/2019   IR THROMBECTOMY AV FISTULA W/THROMBOLYSIS/PTA INC/SHUNT/IMG LEFT Left 11/09/2018   IR THROMBECTOMY AV FISTULA W/THROMBOLYSIS/PTA INC/SHUNT/IMG LEFT Left 06/22/2019   IR US GUIDE VASC ACCESS LEFT  11/09/2018   IR US GUIDE VASC ACCESS LEFT  06/22/2019   IR US GUIDE VASC ACCESS RIGHT  05/16/2016   IR US GUIDE VASC ACCESS RIGHT  02/16/2019   PERIPHERAL VASCULAR BALLOON ANGIOPLASTY  10/09/2018   Procedure: PERIPHERAL VASCULAR BALLOON ANGIOPLASTY;  Surgeon: Serafina Mitchell, MD;  Location: [facility] [facility];  Service: Cardiovascular;;   TEE WITHOUT CARDIOVERSION N/A 02/19/2019   Procedure: TRANSESOPHAGEAL ECHOCARDIOGRAM (TEE);  Surgeon: [name], MD;  Location: [facility] ENDOSCOPY;  Service: Cardiovascular;  Laterality: N/A;       Family History  Problem Relation Age of Onset   Cancer Mother        lung   Diabetes Mother    Heart attack Father    Diabetes Father    Diabetes Sister     Social History   Tobacco Use   Smoking status: Some Days    Types: Cigarettes    Last attempt to quit: 09/03/2006    Years since quitting: 14.1   Smokeless tobacco: Never  Vaping Use   Vaping Use: Never used  Substance Use Topics   Alcohol use: No   Drug use: No    Home Medications Prior to Admission medications   Medication Sig Start Date End Date Taking? Authorizing Provider  albuterol (PROVENTIL) (2.5 MG/3ML) 0.083% nebulizer solution Take 2.5 mg by nebulization every 6 (six) hours as needed for wheezing or shortness of breath.    [provider]  albuterol (VENTOLIN HFA) 108 (90 Base) MCG/ACT  inhaler Inhale 2 puffs into the lungs every 4 (four) hours as needed for wheezing or shortness of breath. 07/21/20   Manuella Ghazi, Pratik D, DO  apixaban (ELIQUIS) 5 MG TABS tablet Take 1 tablet (5 mg total) by mouth 2 (two) times daily. 08/23/19   Strader, Fransisco Hertz, PA-C  aspirin 81 MG chewable tablet Chew 81 mg by mouth daily.    [provider]  atorvastatin (LIPITOR) 10 MG tablet Take 1 tablet (10 mg total) by mouth every evening. 08/03/19   Barton Dubois, MD  butalbital-acetaminophen-caffeine (FIORICET) [phone] MG tablet Take 1 tablet by mouth every 6 (six) hours as needed for headache or migraine. 07/21/20   Manuella Ghazi, Pratik D, DO  calcium acetate (PHOSLO) 667 MG capsule Take 1 capsule (667 mg total) by mouth 3 (three) times daily with meals. 02/21/19  Donnamae Jude, MD  calcium carbonate (TUMS - DOSED IN MG ELEMENTAL CALCIUM) 500 MG chewable tablet Chew 4 tablets (800 mg of elemental calcium total) by mouth 2 (two) times daily. 08/03/19   Barton Dubois, MD  carvedilol (COREG) 3.125 MG tablet Take 1 tablet (3.125 mg total) by mouth 2 (two) times daily with a meal. 04/09/20 10/14/20  Shahmehdi, Valeria Batman, MD  DULoxetine (CYMBALTA) 30 MG capsule Take 30 mg by mouth 2 (two) times daily. 12/26/19   [provider]  guaiFENesin-dextromethorphan (ROBITUSSIN DM) 100-10 MG/5ML syrup Take 5 mLs by mouth every 6 (six) hours as needed for cough. 07/28/20   Barton Dubois, MD  LANTUS SOLOSTAR 100 UNIT/ML Solostar Pen Inject 10 Units into the skin at bedtime.  01/18/19   [provider]  lidocaine-prilocaine (EMLA) cream Apply 1 application topically every Monday, Wednesday, and Friday with hemodialysis. 05/06/19   [provider]  multivitamin (RENA-VIT) TABS tablet Take 1 tablet by mouth daily. 08/04/18   [provider]  patiromer (VELTASSA) 8.4 g packet Take 1 packet (8.4 g total) by mouth 3 (three) times a week. On Tuesday, Thursday and Saturday 06/10/19   Kathie Dike, MD     Allergies    Tape  Review of Systems   Review of Systems  Eyes:  Positive for pain.  All other systems reviewed and are negative.  Physical Exam Updated Vital Signs BP (!) 172/97 (BP Location: Right Arm)   Pulse 88   Temp 98.2 F (36.8 C) (Oral)   Resp 20   Ht '5\' 10"'$  (1.778 m)   Wt 104 kg   SpO2 97%   BMI 32.90 kg/m   Physical Exam Vitals and nursing note reviewed.  Constitutional:      General: He is not in acute distress.    Appearance: He is well-developed. He is not diaphoretic.  HENT:     Head: Normocephalic and atraumatic.  Eyes:     Comments: Examination of the right reveals a slightly hazy cornea.  Pupil is 3 mm and reactive.  The conjunctiva is injected with a clear drainage.  Cardiovascular:     Rate and Rhythm: Normal rate and regular rhythm.     Heart sounds: No murmur heard.   No friction rub.  Pulmonary:     Effort: Pulmonary effort is normal.  Musculoskeletal:        General: Normal range of motion.     Cervical back: Normal range of motion and neck supple.  Skin:    General: Skin is warm and dry.  Neurological:     Mental Status: He is alert.     Coordination: Coordination normal.    ED Results / Procedures / Treatments   Labs (all labs ordered are listed, but only abnormal results are displayed) Labs Reviewed - No data to display  EKG None  Radiology No results found.  Procedures Procedures   Medications Ordered in ED Medications  erythromycin ophthalmic ointment (has no administration in time range)  HYDROcodone-acetaminophen (NORCO/VICODIN) 5-325 MG per tablet 2 tablet (has no administration in time range)    ED Course  I have reviewed the triage vital signs and the nursing notes.  Pertinent labs & imaging results that were available during my care of the patient were reviewed by me and considered in my medical decision making (see chart for details).    MDM Rules/Calculators/A&P  Patient to be treated for what looks  to be conjunctivitis.  He will be given Ilotycin  and is to follow-up with ophthalmology in the next few days.  Final Clinical Impression(s) / ED Diagnoses Final diagnoses:  None    Rx / DC Orders ED Discharge Orders     None        Veryl Speak, MD 10/30/20 [phone]

## 2020-10-30 NOTE — Discharge Instructions (Signed)
Use erythromycin ophthalmic ointment: Place a 1/2 inch ribbon to the inside of your lower eyelid 3 times daily.  Follow-up with your ophthalmologist in the next few days.

## 2020-11-02 ENCOUNTER — Encounter (HOSPITAL_COMMUNITY): Payer: Self-pay | Admitting: Emergency Medicine

## 2020-11-02 ENCOUNTER — Encounter (HOSPITAL_COMMUNITY)
Admission: RE | Admit: 2020-11-02 | Discharge: 2020-11-02 | Disposition: A | Discharge status: Home / Self Care | Payer: Medicaid Other | Source: Ambulatory Visit | Attending: Ophthalmology | Admitting: Ophthalmology

## 2020-11-02 ENCOUNTER — Other Ambulatory Visit: Payer: Self-pay

## 2020-11-02 ENCOUNTER — Emergency Department (HOSPITAL_COMMUNITY)
Admission: EM | Admit: 2020-11-02 | Discharge: 2020-11-03 | Disposition: A | Discharge status: Home / Self Care | Payer: Medicaid Other | Attending: Emergency Medicine | Admitting: Emergency Medicine

## 2020-11-02 ENCOUNTER — Emergency Department (HOSPITAL_COMMUNITY): Payer: Medicaid Other

## 2020-11-02 DIAGNOSIS — R0789 Other chest pain: Secondary | ICD-10-CM | POA: Insufficient documentation

## 2020-11-02 DIAGNOSIS — E1122 Type 2 diabetes mellitus with diabetic chronic kidney disease: Secondary | ICD-10-CM | POA: Diagnosis not present

## 2020-11-02 DIAGNOSIS — Z992 Dependence on renal dialysis: Secondary | ICD-10-CM | POA: Diagnosis not present

## 2020-11-02 DIAGNOSIS — Z794 Long term (current) use of insulin: Secondary | ICD-10-CM | POA: Diagnosis not present

## 2020-11-02 DIAGNOSIS — N186 End stage renal disease: Secondary | ICD-10-CM | POA: Diagnosis not present

## 2020-11-02 DIAGNOSIS — I132 Hypertensive heart and chronic kidney disease with heart failure and with stage 5 chronic kidney disease, or end stage renal disease: Secondary | ICD-10-CM | POA: Diagnosis not present

## 2020-11-02 DIAGNOSIS — Z8616 Personal history of COVID-19: Secondary | ICD-10-CM | POA: Insufficient documentation

## 2020-11-02 DIAGNOSIS — Z79899 Other long term (current) drug therapy: Secondary | ICD-10-CM | POA: Insufficient documentation

## 2020-11-02 DIAGNOSIS — I4891 Unspecified atrial fibrillation: Secondary | ICD-10-CM | POA: Insufficient documentation

## 2020-11-02 DIAGNOSIS — Z7982 Long term (current) use of aspirin: Secondary | ICD-10-CM | POA: Diagnosis not present

## 2020-11-02 DIAGNOSIS — I5043 Acute on chronic combined systolic (congestive) and diastolic (congestive) heart failure: Secondary | ICD-10-CM | POA: Diagnosis not present

## 2020-11-02 DIAGNOSIS — F17210 Nicotine dependence, cigarettes, uncomplicated: Secondary | ICD-10-CM | POA: Diagnosis not present

## 2020-11-02 LAB — CBC WITH DIFFERENTIAL/PLATELET
Abs Immature Granulocytes: 0.03 10*3/uL (ref 0.00–0.07)
Basophils Absolute: 0.1 10*3/uL (ref 0.0–0.1)
Basophils Relative: 1 %
Eosinophils Absolute: 0.2 10*3/uL (ref 0.0–0.5)
Eosinophils Relative: 2 %
HCT: 31.3 % — ABNORMAL LOW (ref 39.0–52.0)
Hemoglobin: 9.9 g/dL — ABNORMAL LOW (ref 13.0–17.0)
Immature Granulocytes: 0 %
Lymphocytes Relative: 19 %
Lymphs Abs: 1.3 10*3/uL (ref 0.7–4.0)
MCH: 32.8 pg (ref 26.0–34.0)
MCHC: 31.6 g/dL (ref 30.0–36.0)
MCV: 103.6 fL — ABNORMAL HIGH (ref 80.0–100.0)
Monocytes Absolute: 0.2 10*3/uL (ref 0.1–1.0)
Monocytes Relative: 4 %
Neutro Abs: 5.1 10*3/uL (ref 1.7–7.7)
Neutrophils Relative %: 74 %
Platelets: 266 10*3/uL (ref 150–400)
RBC: 3.02 MIL/uL — ABNORMAL LOW (ref 4.22–5.81)
RDW: 15 % (ref 11.5–15.5)
WBC: 6.9 10*3/uL (ref 4.0–10.5)
nRBC: 0 % (ref 0.0–0.2)

## 2020-11-02 LAB — BASIC METABOLIC PANEL
Anion gap: 16 — ABNORMAL HIGH (ref 5–15)
BUN: 44 mg/dL — ABNORMAL HIGH (ref 6–20)
CO2: 23 mmol/L (ref 22–32)
Calcium: 8.6 mg/dL — ABNORMAL LOW (ref 8.9–10.3)
Chloride: 95 mmol/L — ABNORMAL LOW (ref 98–111)
Creatinine, Ser: 10.42 mg/dL — ABNORMAL HIGH (ref 0.61–1.24)
GFR, Estimated: 6 mL/min — ABNORMAL LOW (ref >60)
Glucose, Bld: 80 mg/dL (ref 70–99)
Potassium: 4 mmol/L (ref 3.5–5.1)
Sodium: 134 mmol/L — ABNORMAL LOW (ref 135–145)

## 2020-11-02 LAB — TROPONIN I (HIGH SENSITIVITY)
Troponin I (High Sensitivity): 52 ng/L — ABNORMAL HIGH (ref <18)
Troponin I (High Sensitivity): 46 ng/L — ABNORMAL HIGH (ref <18)

## 2020-11-02 IMAGING — DX DG CHEST 1V PORT
1 series · 1 of 1 positions shown · non-contrast
Comparison: [DATE] and [DATE]

CLINICAL DATA: Weakness.  Chest pain.

EXAM:
PORTABLE CHEST 1 VIEW

[chest ap]
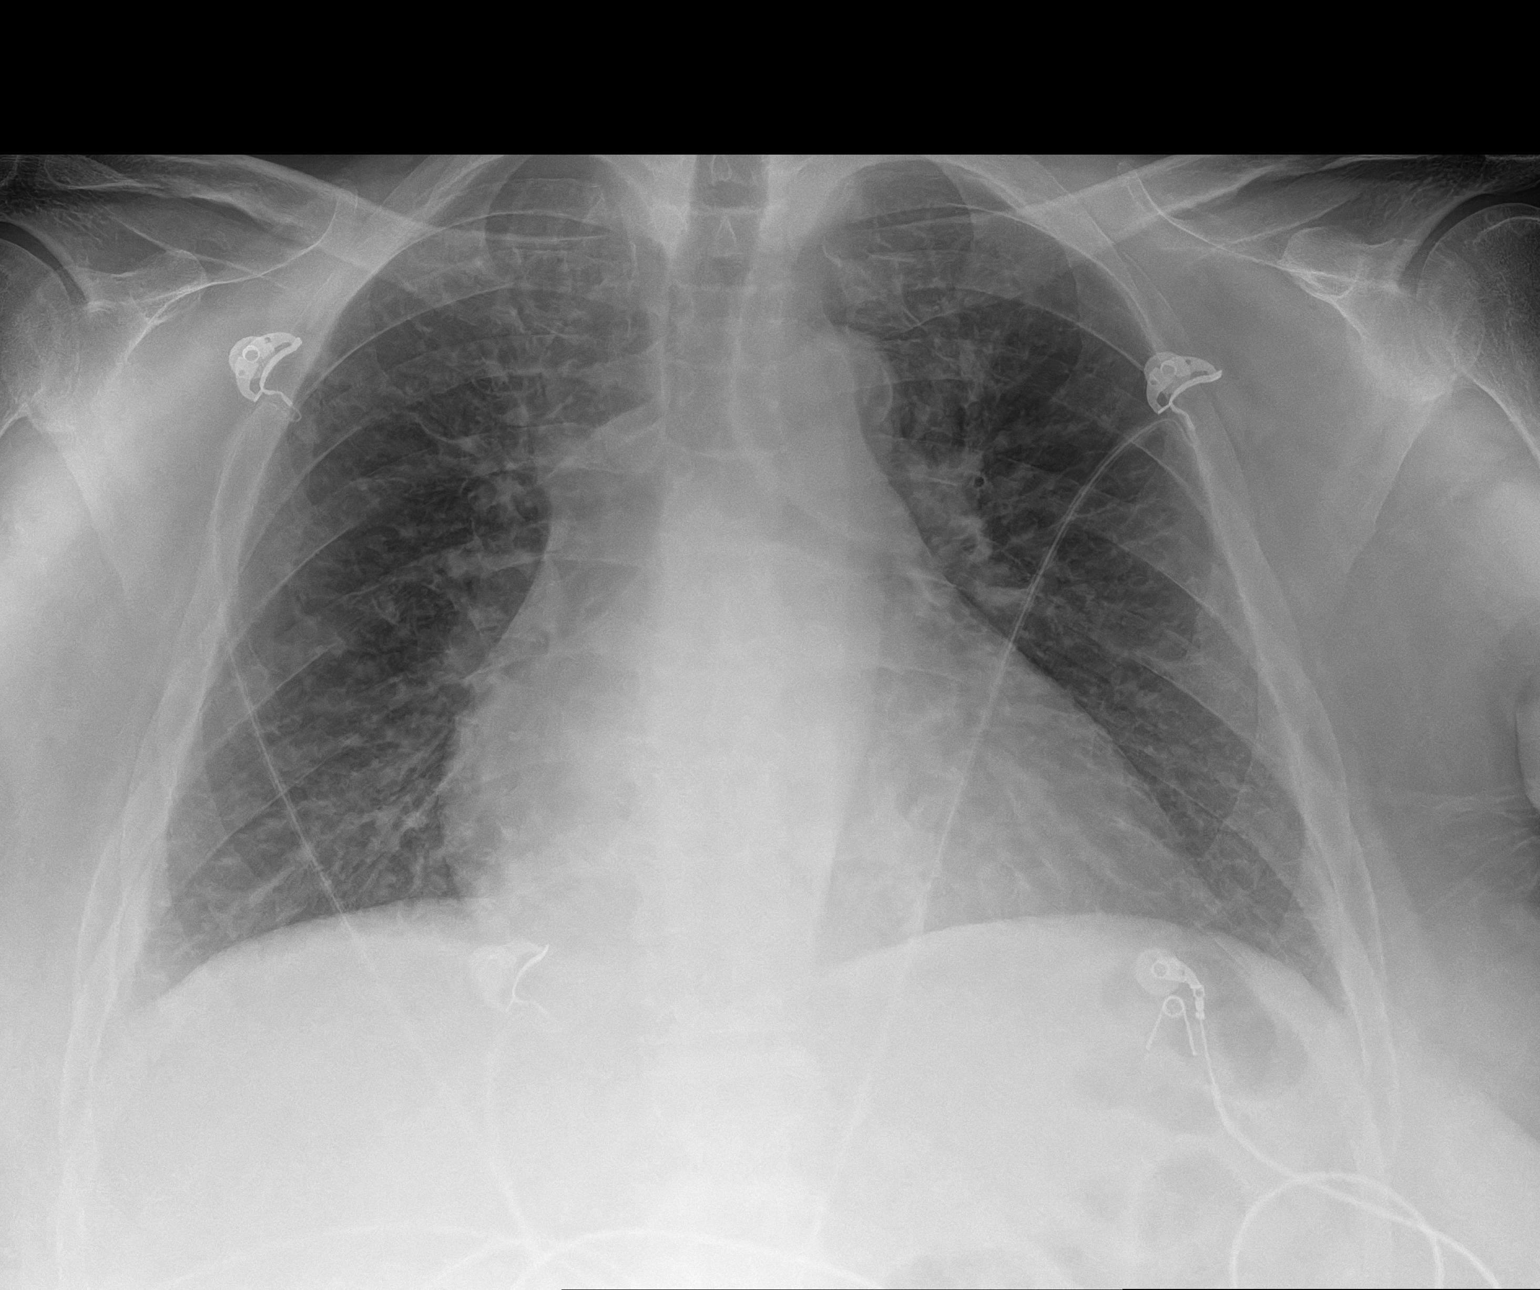

[1 of 1 positions shown; findings below may reference images not displayed]

FINDINGS: Prominent lung markings appear to be chronic. No overt pulmonary
edema. Trachea is midline. Negative for a pneumothorax. No acute
bone abnormality. Stable cardiomegaly.
IMPRESSION: Chronic changes without acute findings.

## 2020-11-02 MED ORDER — HYDROCODONE-ACETAMINOPHEN 5-325 MG PO TABS
1.0000 | ORAL_TABLET | Freq: Once | ORAL | Status: AC
  Administered 2020-11-02: 1 via ORAL
  Filled 2020-11-02: qty 1

## 2020-11-02 NOTE — ED Triage Notes (Signed)
Pt coming from dialysis via [facility] for C/O chest pain. Pt received 1 nitro en route. Pt rating Cp 3/10 at this time. Pt stating the pain is worse with movement. Pt did receive 45 minutes of dialysis today.

## 2020-11-02 NOTE — ED Provider Notes (Signed)
[facility] EMERGENCY DEPARTMENT Provider Note   CSN: [id number] Arrival date & time: 11/02/20  1407     History Chief Complaint  Patient presents with   Chest Pain    Brendan Campbell is a 50 y.o. male.  Patient states when he was at dialysis he had some chest discomfort.  He also states that he recently had corneal surgery on his right eye and he missed his appointment on Thursday.  He is no longer having any chest pain now.  The history is provided by the patient and medical records. No language interpreter was used.  Chest Pain Pain location:  R chest Pain quality: aching   Pain radiates to:  Does not radiate Pain severity:  Moderate Onset quality:  Sudden Timing:  Intermittent Progression:  Resolved Chronicity:  New Context: not breathing   Relieved by:  Nothing Associated symptoms: no abdominal pain, no back pain, no cough, no fatigue and no headache       Past Medical History:  Diagnosis Date   Anemia    Anxiety    Blood transfusion without reported diagnosis    CHF (congestive heart failure) (HCC)    a. EF 25% by echo in 07/2019   Chronic kidney disease    Depression    Diabetes mellitus without complication (HCC)    End stage renal disease ([place])    M/W/F [facility]   Hyperlipidemia    Neuropathy    Peripheral vascular disease (HCC)    PTSD (post-traumatic stress disorder)     Patient Active Problem List   Diagnosis Date Noted   Chronic systolic CHF (congestive heart failure) ([place]) 07/27/2020   Volume overload 07/20/2020   COVID-19 virus infection 07/20/2020   Acute on chronic respiratory failure with hypoxia ([place]) 07/03/2020   Acute on chronic combined systolic and diastolic CHF (congestive heart failure) ([place]) 04/08/2020   Left hemiparesis ([place]) 04/08/2020   Lumbar spondylosis 04/08/2020   Atrial fibrillation with RVR (HCC) 04/08/2020   Elevated troponin 04/08/2020   Pain in limb 12/29/2019   Headache disorder 12/29/2019   Pain in left arm  12/29/2019   Cervical radiculopathy 12/29/2019   Acute congestive heart failure ([place]) 12/13/2019   Macrocytic anemia 09/25/2019   Paresthesia 09/24/2019   Hypocalcemia    Chronic atrial fibrillation (HCC)    Hyperparathyroidism ([place])    Cardiac arrest ([place]) 06/07/2019   Neuropathy    Peripheral vascular disease (HCC)    Prolonged QT interval    Leukocytosis    Atypical pneumonia    Acute pulmonary edema (HCC)    ESRD on hemodialysis (HCC)    Hyperkalemia 10/05/2018   Weakness 10/05/2018   ESRD (end stage renal disease) ([place]) 05/10/2016   Spondylosis of lumbar spine 03/08/2016   Lumbar radiculopathy 03/08/2016   S/P below knee amputation, right ([place]) 03/05/2015   Type 2 diabetes mellitus with diabetic chronic kidney disease ([place]) 03/03/2015   Hyperlipidemia 03/03/2015   Low back pain 03/03/2015    Past Surgical History:  Procedure Laterality Date   A/V FISTULAGRAM N/A 10/09/2018   Procedure: A/V FISTULAGRAM;  Surgeon: Serafina Mitchell, MD;  Location: [place] CV [facility];  Service: Cardiovascular;  Laterality: N/A;   AV FISTULA PLACEMENT Left 09/22/2016   Procedure: CREATION OF LEFT ARM ARTERIOVENOUS (AV) FISTULA;  Surgeon: Waynetta Sandy, MD;  Location: [place];  Service: Vascular;  Laterality: Left;   AV FISTULA PLACEMENT Left 10/31/2017   Procedure: INSERTION OF ARTERIOVENOUS (AV) GORE-TEX 4-78m STETCH GRAFT LEFT  ARM;  Surgeon: Waynetta Sandy, MD;  Location: [place];  Service: Vascular;  Laterality: Left;   [place] Left 08/17/2017   Procedure: SECOND STAGE BASILIC VEIN TRANSPOSITION LEFT ARM;  Surgeon: Waynetta Sandy, MD;  Location: [place];  Service: Vascular;  Laterality: Left;   BELOW KNEE LEG AMPUTATION Right    CARDIOVERSION N/A 02/19/2019   Procedure: CARDIOVERSION;  Surgeon: [name], MD;  Location: [facility] ENDOSCOPY;  Service: Cardiovascular;  Laterality: N/A;   CATARACT EXTRACTION W/PHACO Right 10/23/2020   Procedure:  CATARACT EXTRACTION PHACO AND INTRAOCULAR LENS PLACEMENT ([place]);  Surgeon: Baruch Goldmann, MD;  Location: [facility];  Service: Ophthalmology;  Laterality: Right;  CDE 39.03   CHOLECYSTECTOMY     FOOT SURGERY     IR FLUORO GUIDE CV LINE RIGHT  05/16/2016   IR FLUORO GUIDE CV LINE RIGHT  02/16/2019   IR REMOVAL TUN CV CATH W/O FL  05/16/2016   IR REMOVAL TUN CV CATH W/O FL  02/19/2019   IR THROMBECTOMY AV FISTULA W/THROMBOLYSIS/PTA INC/SHUNT/IMG LEFT Left 11/09/2018   IR THROMBECTOMY AV FISTULA W/THROMBOLYSIS/PTA INC/SHUNT/IMG LEFT Left 06/22/2019   IR US GUIDE VASC ACCESS LEFT  11/09/2018   IR US GUIDE VASC ACCESS LEFT  06/22/2019   IR US GUIDE VASC ACCESS RIGHT  05/16/2016   IR US GUIDE VASC ACCESS RIGHT  02/16/2019   PERIPHERAL VASCULAR BALLOON ANGIOPLASTY  10/09/2018   Procedure: PERIPHERAL VASCULAR BALLOON ANGIOPLASTY;  Surgeon: Serafina Mitchell, MD;  Location: [place] [facility];  Service: Cardiovascular;;   TEE WITHOUT CARDIOVERSION N/A 02/19/2019   Procedure: TRANSESOPHAGEAL ECHOCARDIOGRAM (TEE);  Surgeon: [name], MD;  Location: [facility] ENDOSCOPY;  Service: Cardiovascular;  Laterality: N/A;       Family History  Problem Relation Age of Onset   Cancer Mother        lung   Diabetes Mother    Heart attack Father    Diabetes Father    Diabetes Sister     Social History   Tobacco Use   Smoking status: Some Days    Types: Cigarettes    Last attempt to quit: 09/03/2006    Years since quitting: 14.1   Smokeless tobacco: Never  Vaping Use   Vaping Use: Never used  Substance Use Topics   Alcohol use: No   Drug use: No    Home Medications Prior to Admission medications   Medication Sig Start Date End Date Taking? Authorizing Provider  albuterol (PROVENTIL) (2.5 MG/3ML) 0.083% nebulizer solution Take 2.5 mg by nebulization every 6 (six) hours as needed for wheezing or shortness of breath.    [provider]  albuterol (VENTOLIN HFA) 108 (90 Base) MCG/ACT inhaler Inhale 2 puffs into  the lungs every 4 (four) hours as needed for wheezing or shortness of breath. 07/21/20   Manuella Ghazi, Pratik D, DO  apixaban (ELIQUIS) 5 MG TABS tablet Take 1 tablet (5 mg total) by mouth 2 (two) times daily. 08/23/19   Strader, Fransisco Hertz, PA-C  aspirin 81 MG chewable tablet Chew 81 mg by mouth daily.    [provider]  atorvastatin (LIPITOR) 10 MG tablet Take 1 tablet (10 mg total) by mouth every evening. 08/03/19   Barton Dubois, MD  butalbital-acetaminophen-caffeine (FIORICET) 7628872902 MG tablet Take 1 tablet by mouth every 6 (six) hours as needed for headache or migraine. 07/21/20   Manuella Ghazi, Pratik D, DO  calcium acetate (PHOSLO) 667 MG capsule Take 1 capsule (667 mg total) by mouth 3 (  three) times daily with meals. 02/21/19   Donnamae Jude, MD  calcium carbonate (TUMS - DOSED IN MG ELEMENTAL CALCIUM) 500 MG chewable tablet Chew 4 tablets (800 mg of elemental calcium total) by mouth 2 (two) times daily. 08/03/19   Barton Dubois, MD  carvedilol (COREG) 3.125 MG tablet Take 1 tablet (3.125 mg total) by mouth 2 (two) times daily with a meal. 04/09/20 10/14/20  Shahmehdi, Valeria Batman, MD  DULoxetine (CYMBALTA) 30 MG capsule Take 30 mg by mouth 2 (two) times daily. 12/26/19   [provider]  guaiFENesin-dextromethorphan (ROBITUSSIN DM) 100-10 MG/5ML syrup Take 5 mLs by mouth every 6 (six) hours as needed for cough. 07/28/20   Barton Dubois, MD  LANTUS SOLOSTAR 100 UNIT/ML Solostar Pen Inject 10 Units into the skin at bedtime.  01/18/19   [provider]  lidocaine-prilocaine (EMLA) cream Apply 1 application topically every Monday, Wednesday, and Friday with hemodialysis. 05/06/19   [provider]  multivitamin (RENA-VIT) TABS tablet Take 1 tablet by mouth daily. 08/04/18   [provider]  patiromer (VELTASSA) 8.4 g packet Take 1 packet (8.4 g total) by mouth 3 (three) times a week. On Tuesday, Thursday and Saturday 06/10/19   Kathie Dike, MD    Allergies     Tape  Review of Systems   Review of Systems  Constitutional:  Negative for appetite change and fatigue.  HENT:  Negative for congestion, ear discharge and sinus pressure.   Eyes:  Negative for discharge.  Respiratory:  Negative for cough.   Cardiovascular:  Positive for chest pain.  Gastrointestinal:  Negative for abdominal pain and diarrhea.  Genitourinary:  Negative for frequency and hematuria.  Musculoskeletal:  Negative for back pain.  Skin:  Negative for rash.  Neurological:  Negative for seizures and headaches.  Psychiatric/Behavioral:  Negative for hallucinations.    Physical Exam Updated Vital Signs BP (!) 180/97   Pulse 82   Temp (!) 97.5 F (36.4 C) (Oral)   Resp 15   SpO2 92%   Physical Exam Vitals and nursing note reviewed.  Constitutional:      Appearance: He is well-developed.  HENT:     Head: Normocephalic.     Nose: Nose normal.  Eyes:     General: No scleral icterus.    Comments: Cornea cloudy from recent surgery  Neck:     Thyroid: No thyromegaly.  Cardiovascular:     Rate and Rhythm: Normal rate and regular rhythm.     Heart sounds: No murmur heard.   No friction rub. No gallop.  Pulmonary:     Breath sounds: No stridor. No wheezing or rales.  Chest:     Chest wall: No tenderness.  Abdominal:     General: There is no distension.     Tenderness: There is no abdominal tenderness. There is no rebound.  Musculoskeletal:        General: Normal range of motion.     Cervical back: Neck supple.  Lymphadenopathy:     Cervical: No cervical adenopathy.  Skin:    Findings: No erythema or rash.  Neurological:     Mental Status: He is alert and oriented to person, place, and time.     Motor: No abnormal muscle tone.     Coordination: Coordination normal.  Psychiatric:        Behavior: Behavior normal.    ED Results / Procedures / Treatments   Labs (all labs ordered are listed, but only abnormal results are displayed)  Labs Reviewed  BASIC  METABOLIC PANEL - Abnormal; Notable for the following components:      Result Value   Sodium 134 (*)    Chloride 95 (*)    BUN 44 (*)    Creatinine, Ser 10.42 (*)    Calcium 8.6 (*)    GFR, Estimated 6 (*)    Anion gap 16 (*)    All other components within normal limits  CBC WITH DIFFERENTIAL/PLATELET - Abnormal; Notable for the following components:   RBC 3.02 (*)    Hemoglobin 9.9 (*)    HCT 31.3 (*)    MCV 103.6 (*)    All other components within normal limits  TROPONIN I (HIGH SENSITIVITY) - Abnormal; Notable for the following components:   Troponin I (High Sensitivity) 52 (*)    All other components within normal limits  TROPONIN I (HIGH SENSITIVITY)    EKG EKG Interpretation  Date/Time:  Monday November 02 2020 14:29:52 EDT Ventricular Rate:  83 PR Interval:  206 QRS Duration: 142 QT Interval:  453 QTC Calculation: 533 R Axis:   -61 Text Interpretation: Sinus rhythm Ventricular premature complex Borderline prolonged PR interval Left bundle branch block similar to july 2022 Confirmed by Sherwood Gambler [phone]) on 11/02/2020 2:44:54 PM  Radiology DG Chest Port 1 View  Result Date: 11/02/2020 CLINICAL DATA:  Weakness.  Chest pain. EXAM: PORTABLE CHEST 1 VIEW COMPARISON:  08/28/2020 and 08/26/2020 FINDINGS: Prominent lung markings appear to be chronic. No overt pulmonary edema. Trachea is midline. Negative for a pneumothorax. No acute bone abnormality. Stable cardiomegaly. IMPRESSION: Chronic changes without acute findings. Electronically Signed   By: Markus Daft M.D.   On: 11/02/2020 16:56    Procedures Procedures   Medications Ordered in ED Medications  HYDROcodone-acetaminophen (NORCO/VICODIN) 5-325 MG per tablet 1 tablet (1 tablet Oral Given 11/02/20 1612)    ED Course  I have reviewed the triage vital signs and the nursing notes.  Pertinent labs & imaging results that were available during my care of the patient were reviewed by me and considered in my medical  decision making (see chart for details).    MDM Rules/Calculators/A&P                           Patient without chest pain.  Troponin is mildly elevated but in not significantly changed from his normal.  Patient will be discharged home to follow-up with PCP and he is also referred back to his eye doctor because he recently had surgery on his eye and did not go to his follow-up appointment Final Clinical Impression(s) / ED Diagnoses Final diagnoses:  Atypical chest pain    Rx / DC Orders ED Discharge Orders     None        Milton Ferguson, MD 11/02/20 2255

## 2020-11-02 NOTE — Discharge Instructions (Addendum)
Follow-up with your eye doctor Thursday at 145.  It is very important that you make this appointment.  The doctor's name is Dr. Marisa Hua, go to dialysis on Wednesday.  Follow-up with your family doctor if any problem

## 2020-11-02 NOTE — ED Notes (Signed)
EMS transport called.

## 2020-11-03 MED ORDER — CLONIDINE HCL 0.2 MG PO TABS
0.2000 mg | ORAL_TABLET | Freq: Once | ORAL | Status: AC
  Administered 2020-11-03: 0.2 mg via ORAL
  Filled 2020-11-03: qty 1

## 2020-11-03 MED ORDER — OXYCODONE-ACETAMINOPHEN 5-325 MG PO TABS
2.0000 | ORAL_TABLET | Freq: Once | ORAL | Status: AC
  Administered 2020-11-03: 2 via ORAL
  Filled 2020-11-03: qty 2

## 2020-11-03 NOTE — ED Notes (Signed)
Rn to room at assess pt since rescue squad has arrived. BP elevated and pt c/o 9/10 pain.  Unable to transport pt back r/t to the same. Edp notified and meds ordered. Pt placed back on list to take back.

## 2020-11-06 ENCOUNTER — Ambulatory Visit: Admit: 2020-11-06 | Payer: Medicaid Other | Admitting: Ophthalmology

## 2020-11-06 SURGERY — PHACOEMULSIFICATION, CATARACT, WITH IOL INSERTION
Anesthesia: Monitor Anesthesia Care | Laterality: Left

## 2020-11-10 ENCOUNTER — Other Ambulatory Visit: Payer: Self-pay | Admitting: *Deleted

## 2020-11-10 DIAGNOSIS — N186 End stage renal disease: Secondary | ICD-10-CM

## 2020-11-11 ENCOUNTER — Encounter: Payer: Medicaid Other | Admitting: Vascular Surgery

## 2020-11-12 DIAGNOSIS — H44001 Unspecified purulent endophthalmitis, right eye: Secondary | ICD-10-CM | POA: Insufficient documentation

## 2020-11-26 ENCOUNTER — Emergency Department (HOSPITAL_COMMUNITY)
Admission: EM | Admit: 2020-11-26 | Discharge: 2020-11-26 | Disposition: A | Discharge status: Home / Self Care | Payer: Medicaid Other | Attending: Emergency Medicine | Admitting: Emergency Medicine

## 2020-11-26 ENCOUNTER — Emergency Department (HOSPITAL_COMMUNITY): Payer: Medicaid Other

## 2020-11-26 ENCOUNTER — Other Ambulatory Visit: Payer: Self-pay

## 2020-11-26 ENCOUNTER — Encounter (HOSPITAL_COMMUNITY): Payer: Self-pay

## 2020-11-26 DIAGNOSIS — N186 End stage renal disease: Secondary | ICD-10-CM | POA: Diagnosis not present

## 2020-11-26 DIAGNOSIS — Z7984 Long term (current) use of oral hypoglycemic drugs: Secondary | ICD-10-CM | POA: Insufficient documentation

## 2020-11-26 DIAGNOSIS — Z7982 Long term (current) use of aspirin: Secondary | ICD-10-CM | POA: Insufficient documentation

## 2020-11-26 DIAGNOSIS — F17210 Nicotine dependence, cigarettes, uncomplicated: Secondary | ICD-10-CM | POA: Diagnosis not present

## 2020-11-26 DIAGNOSIS — R1010 Upper abdominal pain, unspecified: Secondary | ICD-10-CM

## 2020-11-26 DIAGNOSIS — R1013 Epigastric pain: Secondary | ICD-10-CM | POA: Insufficient documentation

## 2020-11-26 DIAGNOSIS — E1122 Type 2 diabetes mellitus with diabetic chronic kidney disease: Secondary | ICD-10-CM | POA: Diagnosis not present

## 2020-11-26 DIAGNOSIS — Z992 Dependence on renal dialysis: Secondary | ICD-10-CM | POA: Diagnosis not present

## 2020-11-26 DIAGNOSIS — Z7901 Long term (current) use of anticoagulants: Secondary | ICD-10-CM | POA: Insufficient documentation

## 2020-11-26 LAB — TROPONIN I (HIGH SENSITIVITY)
Troponin I (High Sensitivity): 44 ng/L — ABNORMAL HIGH (ref <18)
Troponin I (High Sensitivity): 44 ng/L — ABNORMAL HIGH (ref <18)

## 2020-11-26 LAB — LIPASE, BLOOD: Lipase: 30 U/L (ref 11–51)

## 2020-11-26 LAB — CBC
HCT: 30.7 % — ABNORMAL LOW (ref 39.0–52.0)
Hemoglobin: 9.7 g/dL — ABNORMAL LOW (ref 13.0–17.0)
MCH: 33.7 pg (ref 26.0–34.0)
MCHC: 31.6 g/dL (ref 30.0–36.0)
MCV: 106.6 fL — ABNORMAL HIGH (ref 80.0–100.0)
Platelets: 198 10*3/uL (ref 150–400)
RBC: 2.88 MIL/uL — ABNORMAL LOW (ref 4.22–5.81)
RDW: 18.3 % — ABNORMAL HIGH (ref 11.5–15.5)
WBC: 5.5 10*3/uL (ref 4.0–10.5)
nRBC: 0 % (ref 0.0–0.2)

## 2020-11-26 LAB — HEPATIC FUNCTION PANEL
ALT: 15 U/L (ref 0–44)
AST: 14 U/L — ABNORMAL LOW (ref 15–41)
Albumin: 3.3 g/dL — ABNORMAL LOW (ref 3.5–5.0)
Alkaline Phosphatase: 50 U/L (ref 38–126)
Bilirubin, Direct: 0.2 mg/dL (ref 0.0–0.2)
Indirect Bilirubin: 0.8 mg/dL (ref 0.3–0.9)
Total Bilirubin: 1 mg/dL (ref 0.3–1.2)
Total Protein: 6.4 g/dL — ABNORMAL LOW (ref 6.5–8.1)

## 2020-11-26 LAB — BASIC METABOLIC PANEL
Anion gap: 14 (ref 5–15)
BUN: 46 mg/dL — ABNORMAL HIGH (ref 6–20)
CO2: 23 mmol/L (ref 22–32)
Calcium: 9 mg/dL (ref 8.9–10.3)
Chloride: 101 mmol/L (ref 98–111)
Creatinine, Ser: 10.86 mg/dL — ABNORMAL HIGH (ref 0.61–1.24)
GFR, Estimated: 5 mL/min — ABNORMAL LOW (ref >60)
Glucose, Bld: 109 mg/dL — ABNORMAL HIGH (ref 70–99)
Potassium: 4 mmol/L (ref 3.5–5.1)
Sodium: 138 mmol/L (ref 135–145)

## 2020-11-26 IMAGING — DX DG CHEST 1V PORT
1 series · 1 of 1 positions shown · non-contrast
Comparison: [DATE]

CLINICAL DATA: Pt c/o CP and SOB, hx of CHF, current smoker. Pt
also states he missed dialysis yesterday due to left foot pain.

EXAM:
PORTABLE CHEST - 1 VIEW

[chest ap]
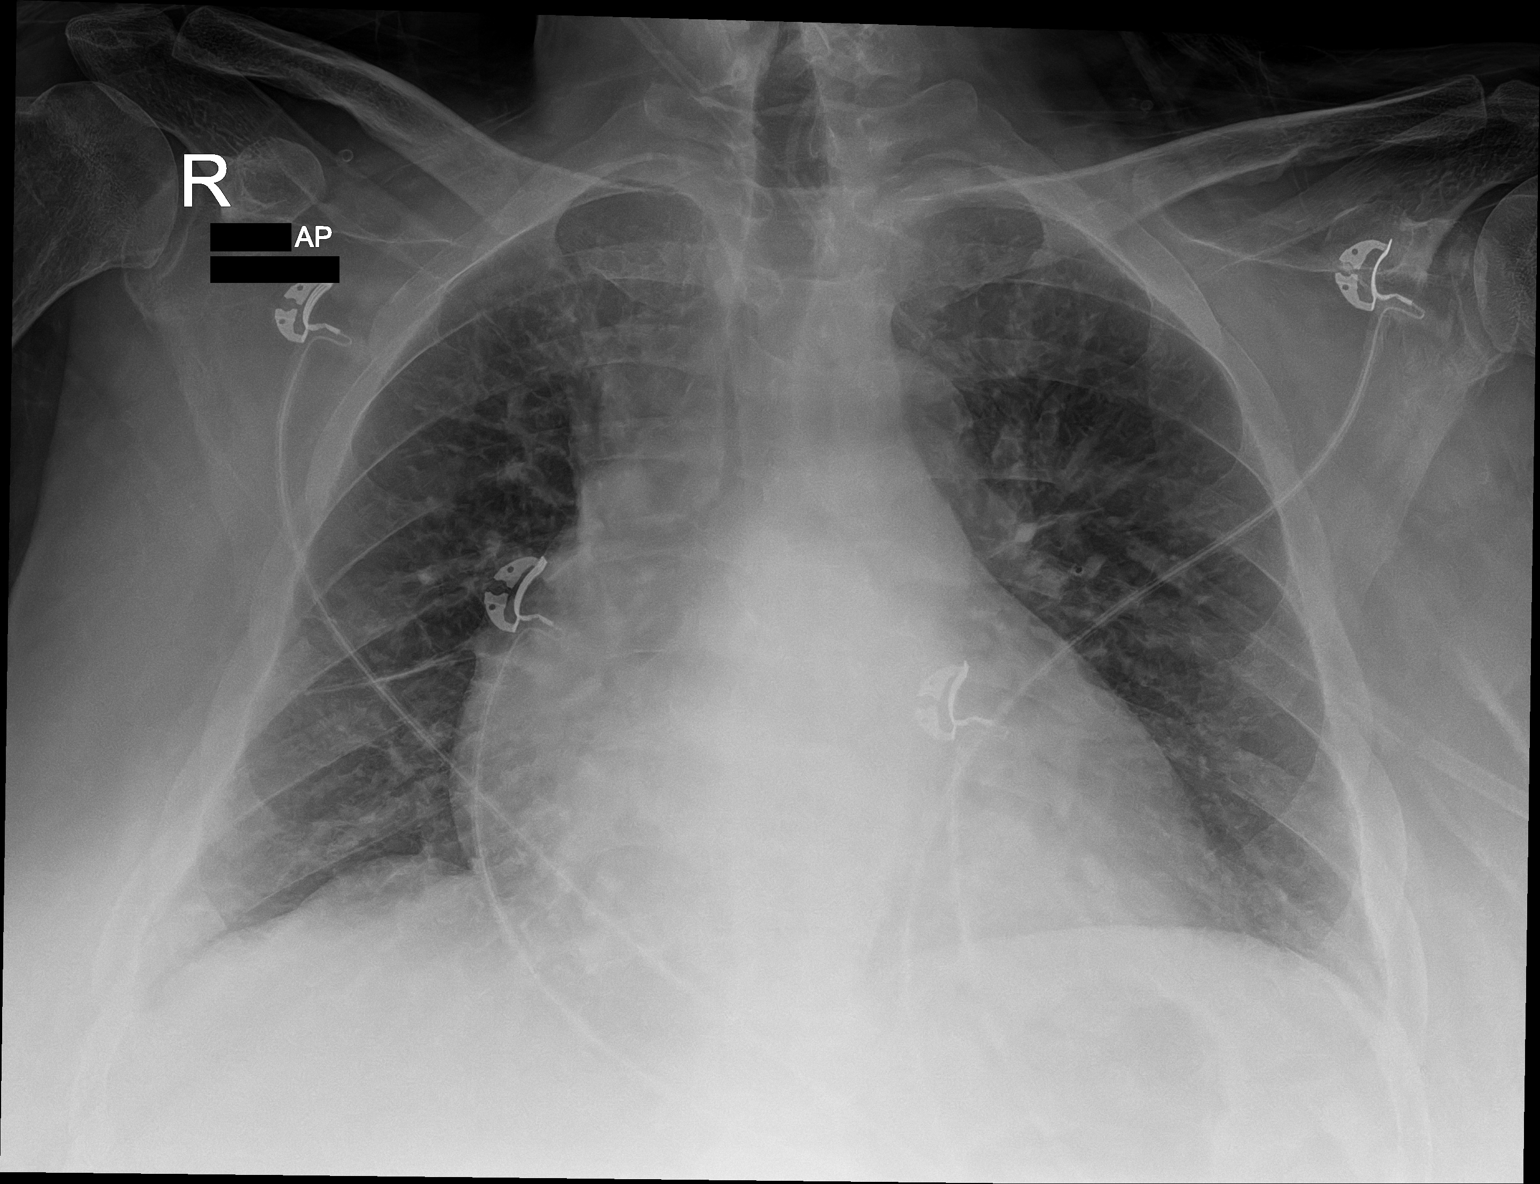

[1 of 1 positions shown; findings below may reference images not displayed]

FINDINGS: Lungs clear.

Mild cardiomegaly stable.  Aortic Atherosclerosis ([A8]-170.0).

No effusion.  No pneumothorax.

Visualized bones unremarkable.
IMPRESSION: Stable mild cardiomegaly.  No acute findings.

## 2020-11-26 IMAGING — CT CT RENAL STONE PROTOCOL
2 of 4 series · 15 of 46 positions shown, 17 images · non-contrast
Comparison: CT Abdomen and Pelvis [DATE].

CLINICAL DATA: 50 year old male with mid abdominal and flank pain
since overnight. Dialysis patient, but missed dialysis yesterday.

EXAM:
CT ABDOMEN AND PELVIS WITHOUT CONTRAST
TECHNIQUE: Multidetector CT imaging of the abdomen and pelvis was performed
following the standard protocol without IV contrast.

[Series 2: axial st · axial · 0.93mm/px · z∈[+711,+1166]mm · 12 of 105 slices shown, 14 images]
[im 7/105  soft-tissue]
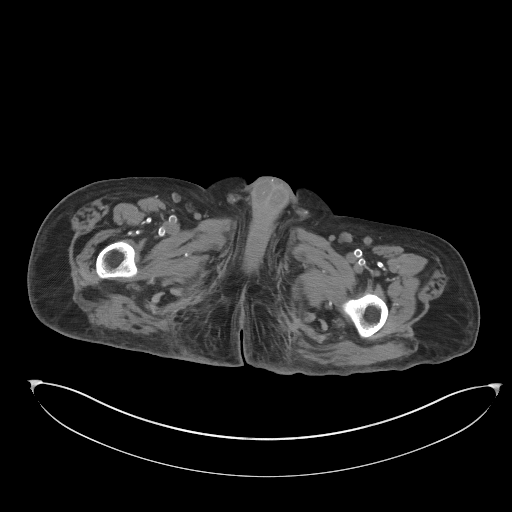
[im 7/105  bone]
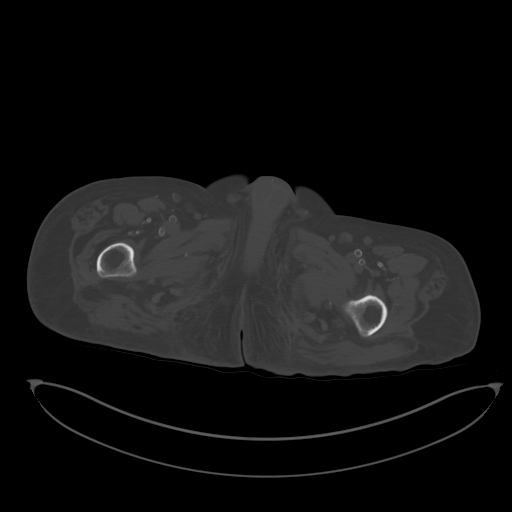
[im 14/105  soft-tissue]
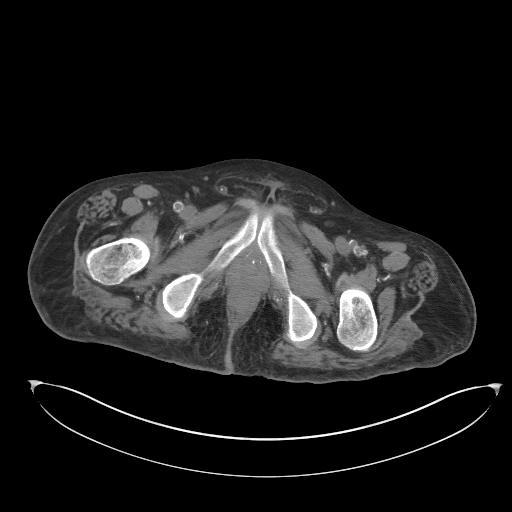
[im 27/105  soft-tissue]
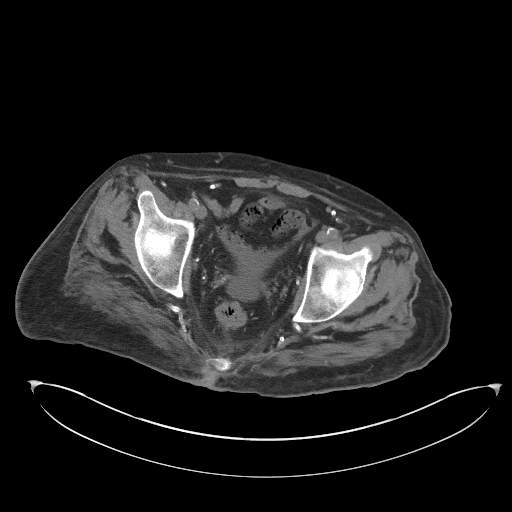
[im 33/105  soft-tissue]
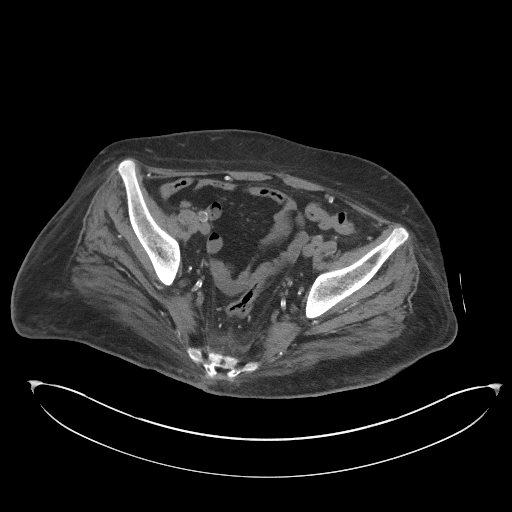
[im 40/105  soft-tissue]
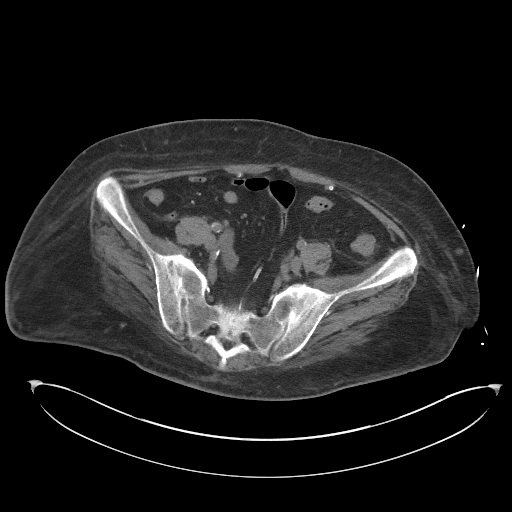
[im 46/105  soft-tissue]
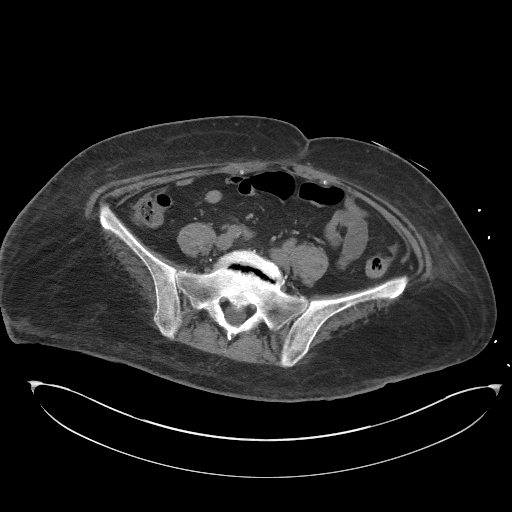
[im 59/105  soft-tissue]
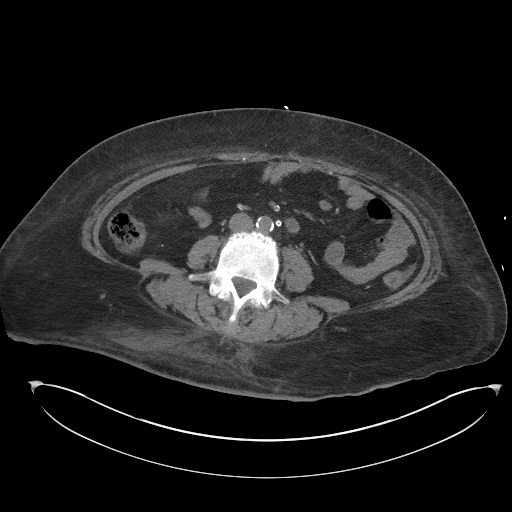
[im 66/105  soft-tissue]
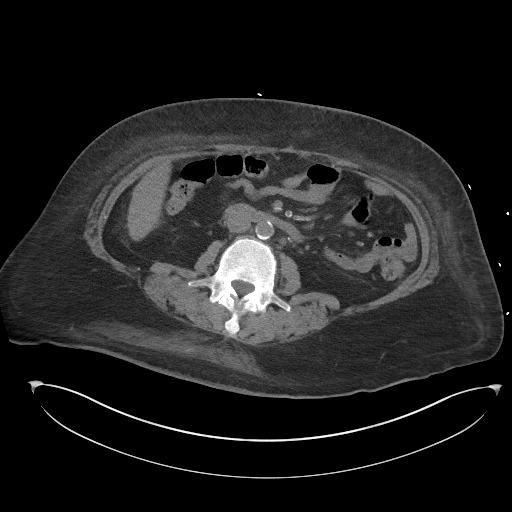
[im 72/105  soft-tissue]
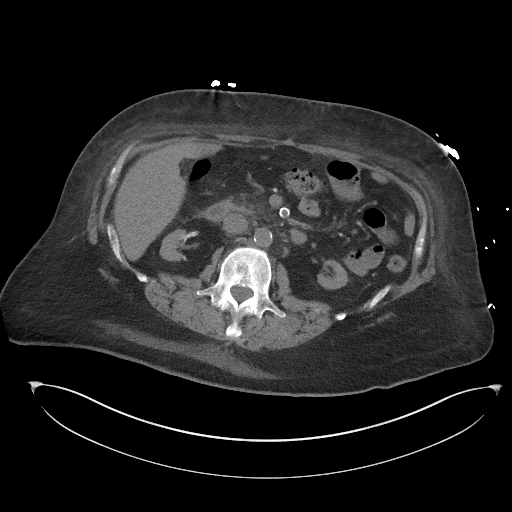
[im 72/105  bone]
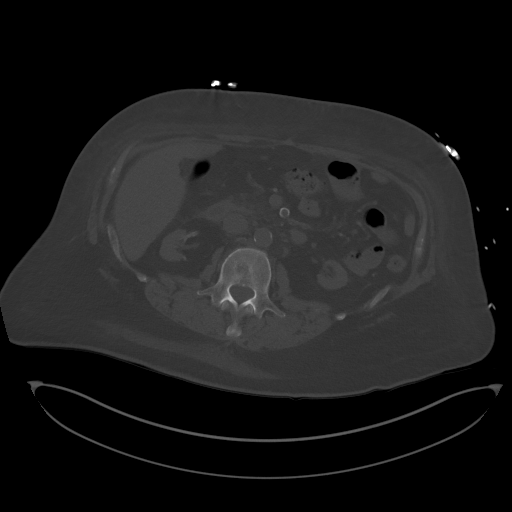
[im 79/105  soft-tissue]
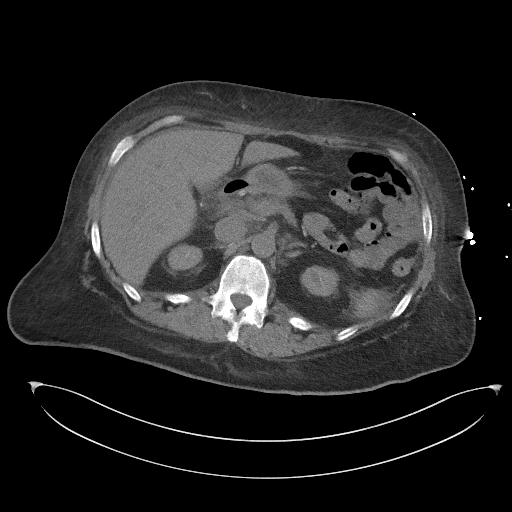
[im 92/105  soft-tissue]
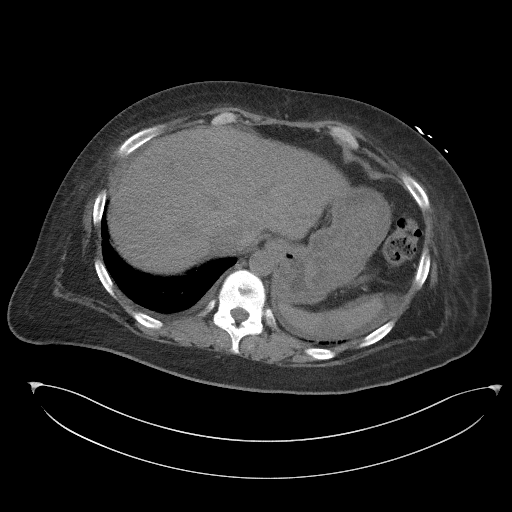
[im 98/105  soft-tissue]
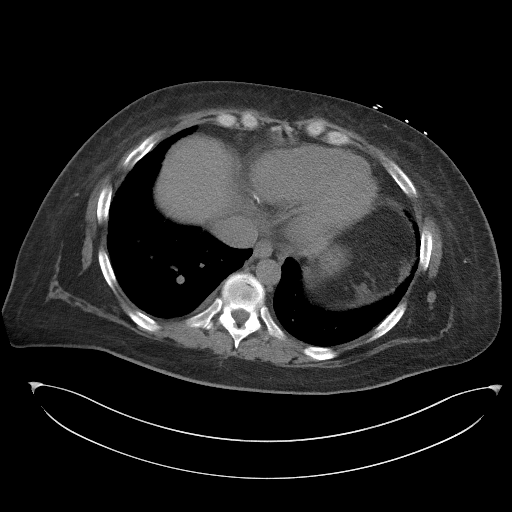

[Series 5: coronal st · coronal · 0.87mm/px · 3 of 103 slices shown]
[im 35/103  soft-tissue]
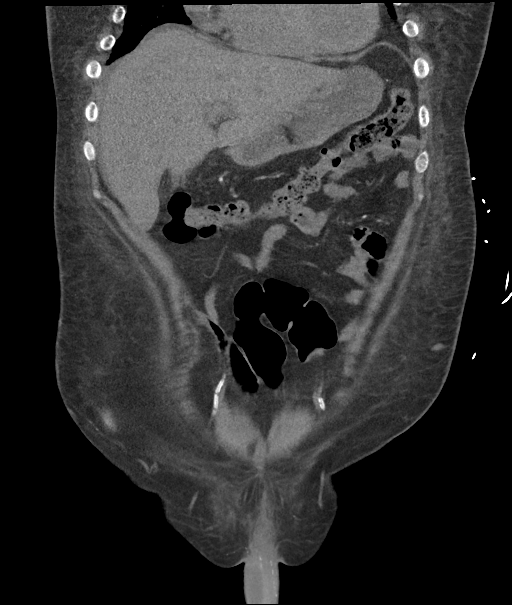
[im 46/103  soft-tissue]
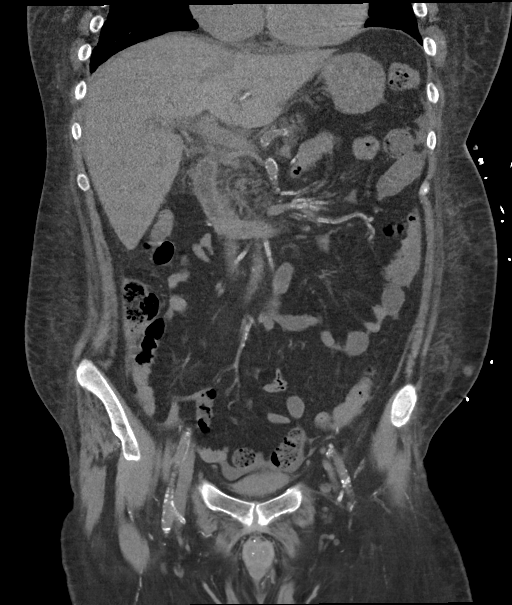
[im 57/103  soft-tissue]
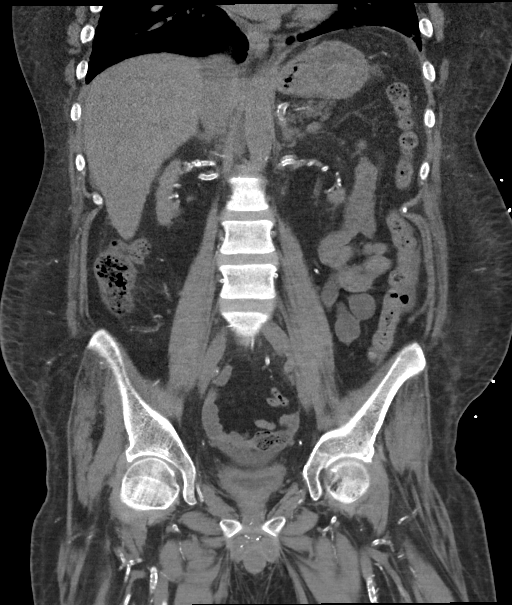

[15 of 46 positions shown; findings below may reference images not displayed]

FINDINGS: Lower chest: Mild cardiomegaly since [8J]. No pericardial effusion.
Trace right pleural effusion, decreased from that seen in [8J]. No
significant left pleural fluid. Mild septal thickening at both lung
bases.

Hepatobiliary: Absent gallbladder. Trace perihepatic free fluid.
Negative noncontrast liver.

Pancreas: Partially atrophied. Difficult to exclude mild
inflammation at the pancreatic head, although the other segments
appear within normal limits. No pancreatic ductal dilatation.

Spleen: Small volume of adjacent free fluid. Splenic calcified
atherosclerosis.

Adrenals/Urinary Tract: Stable adrenal glands. Atrophied kidneys
with renal vascular calcifications. No hydronephrosis or
hydroureter. Decompressed bladder. Small volume of free

Stomach/Bowel: Decompressed rectal which might explain the
appearance of circumferential wall thickening on series 2, image 82.
Redundant but mostly decompressed sigmoid colon. Trace free fluid in
the left gutter. Decompressed adjacent descending colon. Negative
transverse colon. Negative right colon and appendix (series 2, image
66). Negative terminal ileum. No dilated small bowel.

The stomach and duodenum are partially fluid-filled but relatively
decompressed. No free air. There is inflammatory stranding adjacent
to the proximal duodenum (series 2, image 30), also mildly involving
the celiac axis.

Other small bowel loops appear within normal limits.

Vascular/Lymphatic: Diffuse calcified atherosclerosis. Normal
caliber abdominal aorta. Vascular patency is not evaluated in the
absence of IV contrast. No lymphadenopathy.

Reproductive: Negative.

Other: Mild presacral stranding. Small volume of free fluid in the
pelvis with simple fluid density.

Musculoskeletal: Widespread advanced vertebral endplate
degeneration, progressed since [8J] and likely in part
spondyloarthropathy of hemodialysis. Underlying mild renal
osteodystrophy changes in bone mineralization. There is a
chronic/congenital nondisplaced right L5 pars fracture. Stable mild
L5-S1 grade 1 anterolisthesis. Mild T11 superior endplate
compression is stable. Chronic partial SI joint ankylosis. No acute
or suspicious osseous lesion identified.

Mild generalized body wall edema, new since [8J].
IMPRESSION: 1. Inflammatory stranding adjacent to the proximal duodenum. Unclear
whether this is acute peptic ulcer disease, duodenitis, or focal
acute Pancreatitis of the pancreatic head. No free air.

2. Small volume of free fluid scattered in the abdomen and pelvis,
favor secondary to anasarca - with mild body wall edema, trace right
pleural fluid, and mild pulmonary septal thickening at the lung
bases.

3. Mild cardiomegaly is new since [DATE]. Other sequelae of End stage renal disease including advanced
atherosclerosis, osteodystrophy, spondyloarthropathy of
hemodialysis.

## 2020-11-26 IMAGING — DX DG FOOT COMPLETE 3+V*L*
3 series · 3 of 3 positions shown · non-contrast
Comparison: None available

CLINICAL DATA: Pain and swelling

EXAM:
LEFT FOOT - COMPLETE 3+ VIEW

[foot ap]
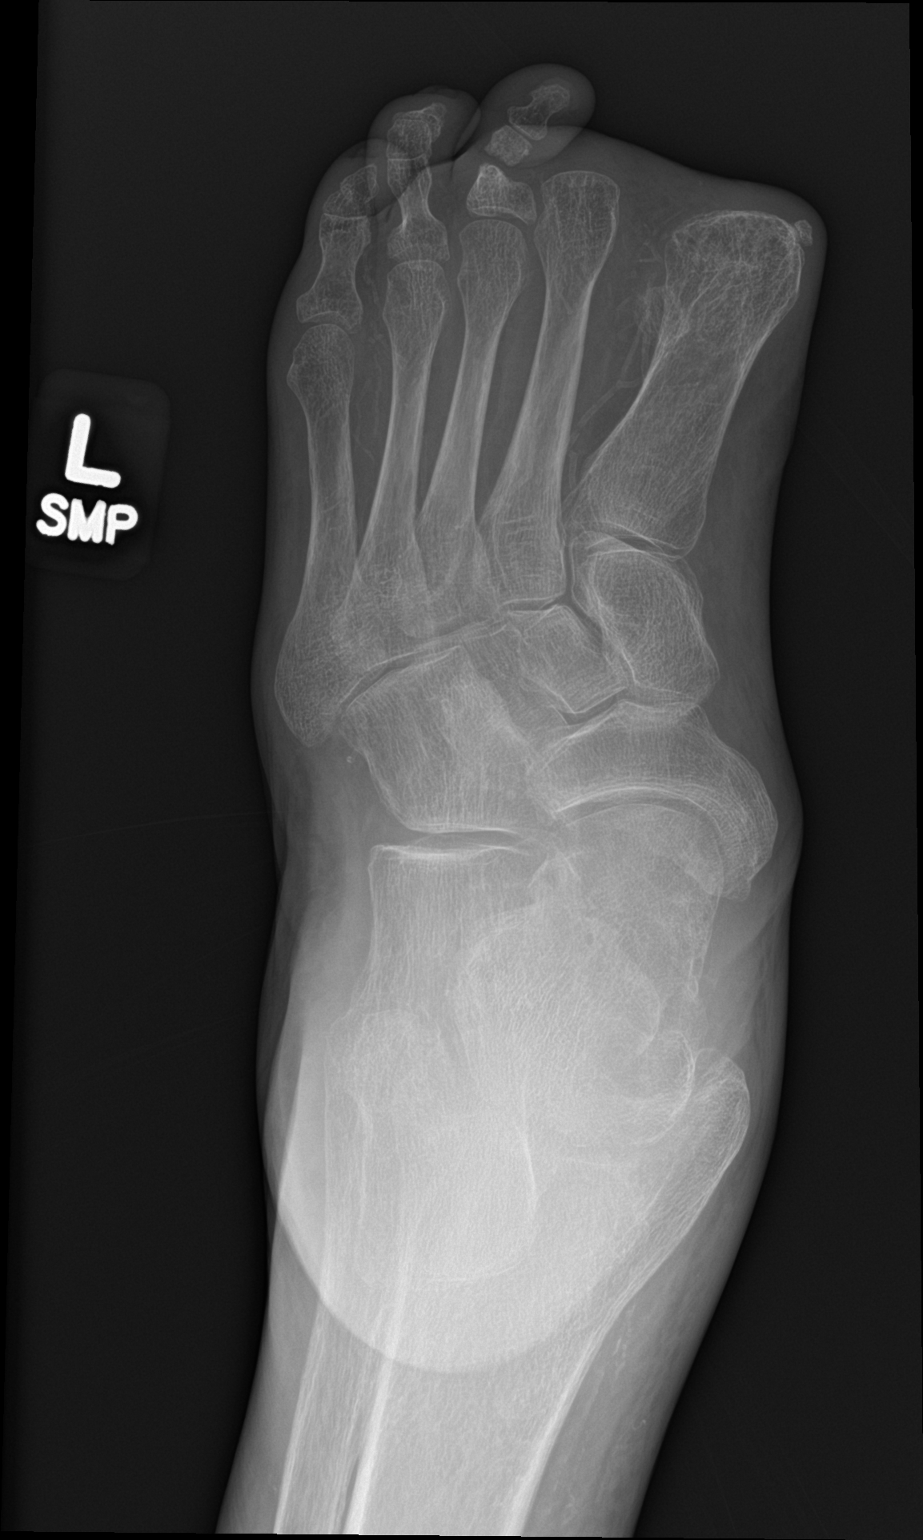

[foot obl]
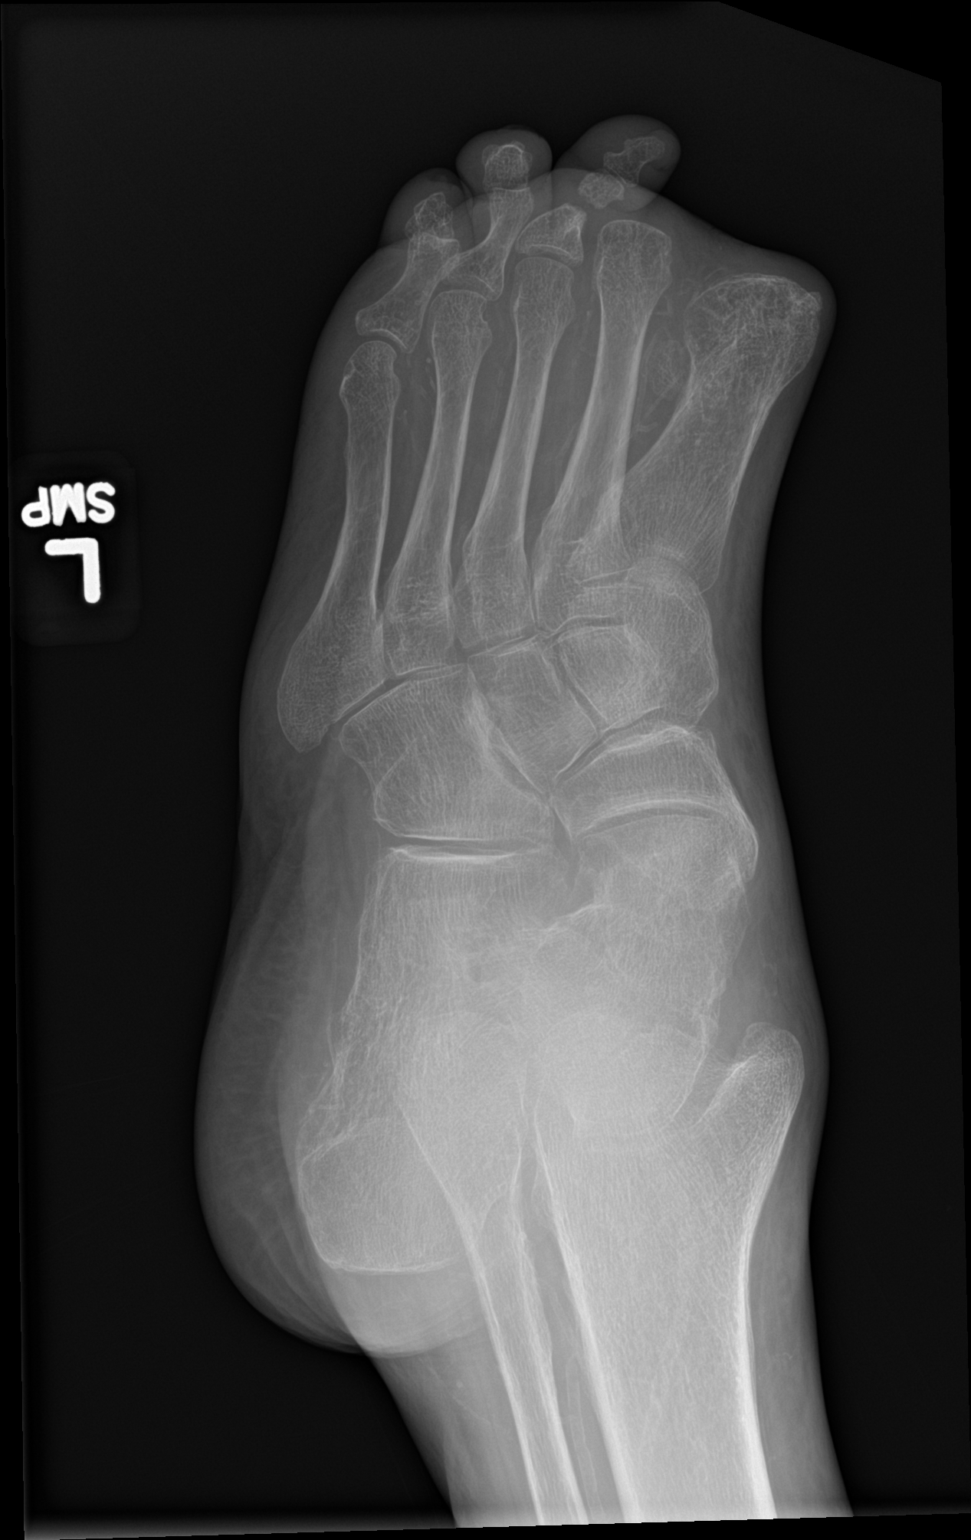

[foot lat]
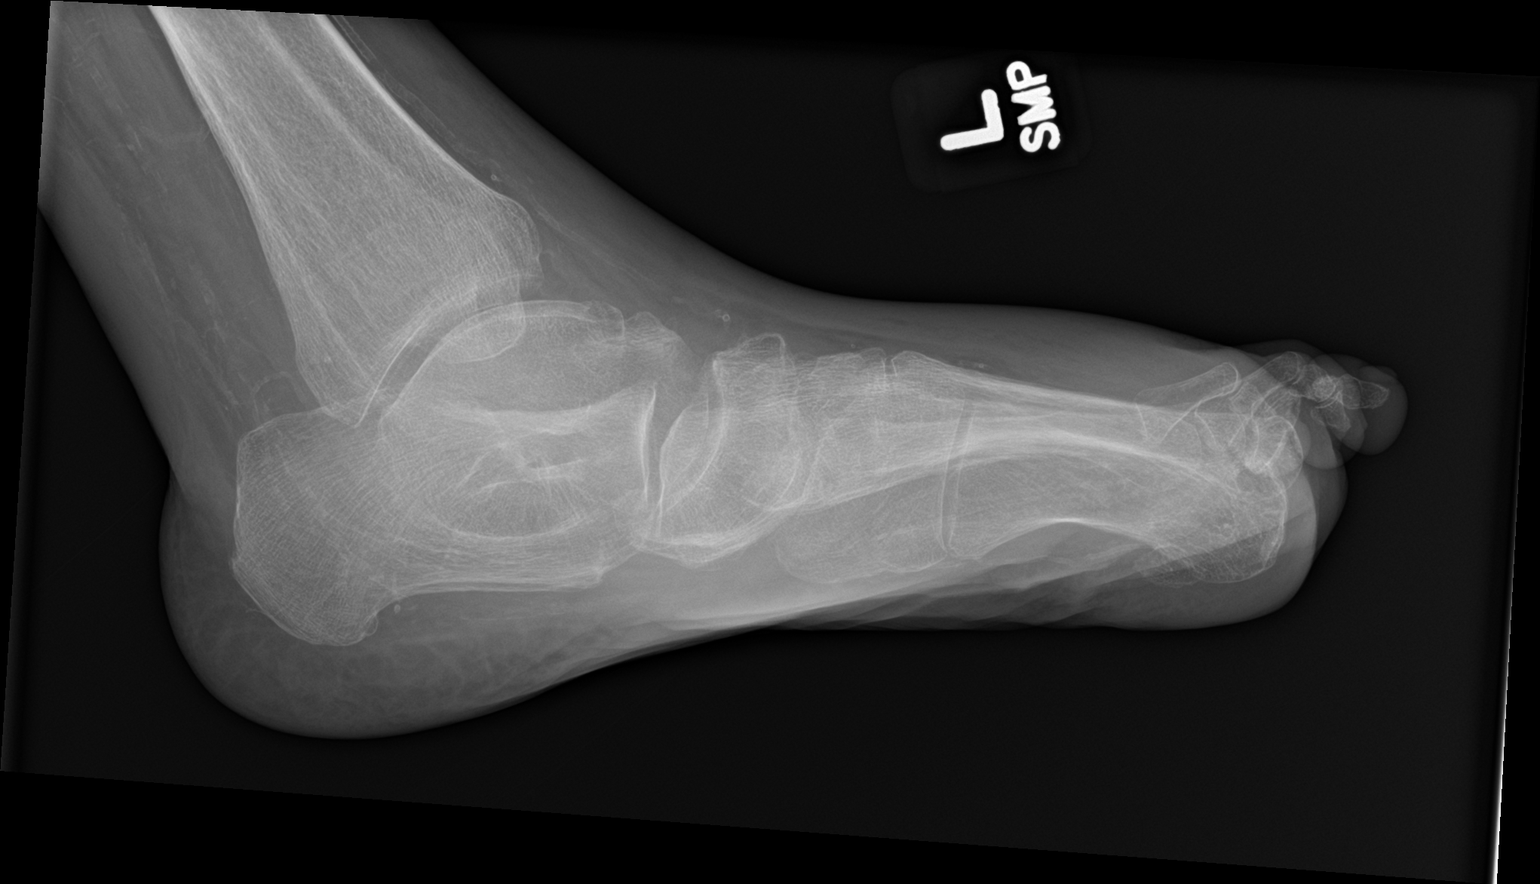

[3 of 3 positions shown; findings below may reference images not displayed]

FINDINGS: Previous amputation across the first and second MTP joints.
Attenuated proximal phalanx of the third digit, well corticated. No
definite cortical destruction to suggest osteomyelitis. Diffuse
osteopenia. Patchy vascular calcifications. Chronic appearing
subtalar collapse, incompletely evaluated. Regional soft tissues
unremarkable.
IMPRESSION: Postoperative and degenerative changes as above. No definite acute
findings.

## 2020-11-26 MED ORDER — PANTOPRAZOLE SODIUM 20 MG PO TBEC
20.0000 mg | DELAYED_RELEASE_TABLET | Freq: Every day | ORAL | 1 refills | Status: DC
Start: 2020-11-26 — End: 2021-03-31

## 2020-11-26 MED ORDER — DOXYCYCLINE HYCLATE 100 MG PO CAPS: ORAL_CAPSULE | ORAL | 0 refills | Status: DC

## 2020-11-26 MED ORDER — HYDROCODONE-ACETAMINOPHEN 5-325 MG PO TABS: 1.0000 | ORAL_TABLET | Freq: Four times a day (QID) | ORAL | 0 refills | Status: DC | PRN

## 2020-11-26 MED ORDER — HYDROCODONE-ACETAMINOPHEN 5-325 MG PO TABS
1.0000 | ORAL_TABLET | Freq: Once | ORAL | Status: AC
  Administered 2020-11-26: 1 via ORAL
  Filled 2020-11-26: qty 1

## 2020-11-26 NOTE — ED Notes (Signed)
Bruit and thrill present to left upper arm. Pt last dialysis was Monday.

## 2020-11-26 NOTE — ED Triage Notes (Signed)
Rcems from home with cc of chest pain. Pt is a took aspirin before ems arrived.   MTF dialysis but missed yesterday for left foot pain. Has open blister to outside of foot he wants looked at.

## 2020-11-26 NOTE — ED Provider Notes (Signed)
[facility] EMERGENCY DEPARTMENT Provider Note   CSN: [id number] Arrival date & time: 11/26/20  0631     History Chief Complaint  Patient presents with   Chest Pain    Brendan Campbell is a 49 y.o. male.  Patient presents with abdominal pain and chest discomfort.  Patient also complains of redness to left foot and mild tenderness  The history is provided by the patient and medical records. No language interpreter was used.  Chest Pain Pain location:  Epigastric Pain quality: aching   Pain radiates to:  Does not radiate Pain severity:  Moderate Onset quality:  Sudden Timing:  Constant Progression:  Waxing and waning Chronicity:  New Context: intercourse   Relieved by:  Nothing Worsened by:  Nothing Associated symptoms: abdominal pain   Associated symptoms: no back pain, no cough, no fatigue and no headache       Past Medical History:  Diagnosis Date   Anemia    Anxiety    Blood transfusion without reported diagnosis    CHF (congestive heart failure) (HCC)    a. EF 25% by echo in 07/2019   Chronic kidney disease    Depression    Diabetes mellitus without complication (HCC)    End stage renal disease ([place])    M/W/F [facility]   Hyperlipidemia    Neuropathy    Peripheral vascular disease (HCC)    PTSD (post-traumatic stress disorder)     Patient Active Problem List   Diagnosis Date Noted   Chronic systolic CHF (congestive heart failure) ([place]) 07/27/2020   Volume overload 07/20/2020   COVID-19 virus infection 07/20/2020   Acute on chronic respiratory failure with hypoxia ([place]) 07/03/2020   Acute on chronic combined systolic and diastolic CHF (congestive heart failure) ([place]) 04/08/2020   Left hemiparesis ([place]) 04/08/2020   Lumbar spondylosis 04/08/2020   Atrial fibrillation with RVR (HCC) 04/08/2020   Elevated troponin 04/08/2020   Pain in limb 12/29/2019   Headache disorder 12/29/2019   Pain in left arm 12/29/2019   Cervical radiculopathy 12/29/2019    Acute congestive heart failure ([place]) 12/13/2019   Macrocytic anemia 09/25/2019   Paresthesia 09/24/2019   Hypocalcemia    Chronic atrial fibrillation (HCC)    Hyperparathyroidism ([place])    Cardiac arrest ([place]) 06/07/2019   Neuropathy    Peripheral vascular disease (HCC)    Prolonged QT interval    Leukocytosis    Atypical pneumonia    Acute pulmonary edema (HCC)    ESRD on hemodialysis (HCC)    Hyperkalemia 10/05/2018   Weakness 10/05/2018   ESRD (end stage renal disease) ([place]) 05/10/2016   Spondylosis of lumbar spine 03/08/2016   Lumbar radiculopathy 03/08/2016   S/P below knee amputation, right ([place]) 03/05/2015   Type 2 diabetes mellitus with diabetic chronic kidney disease ([place]) 03/03/2015   Hyperlipidemia 03/03/2015   Low back pain 03/03/2015    Past Surgical History:  Procedure Laterality Date   A/V FISTULAGRAM N/A 10/09/2018   Procedure: A/V FISTULAGRAM;  Surgeon: Serafina Mitchell, MD;  Location: [place] [facility];  Service: Cardiovascular;  Laterality: N/A;   AV FISTULA PLACEMENT Left 09/22/2016   Procedure: CREATION OF LEFT ARM ARTERIOVENOUS (AV) FISTULA;  Surgeon: Waynetta Sandy, MD;  Location: [place];  Service: Vascular;  Laterality: Left;   AV FISTULA PLACEMENT Left 10/31/2017   Procedure: INSERTION OF ARTERIOVENOUS (AV) GORE-TEX 4-90m STETCH GRAFT LEFT ARM;  Surgeon: CWaynetta Sandy MD;  [place]  Service: Vascular;  Laterality:  Left;   BASCILIC VEIN TRANSPOSITION Left 08/17/2017   Procedure: SECOND STAGE BASILIC VEIN TRANSPOSITION LEFT ARM;  Surgeon: Waynetta Sandy, MD;  Location: [place];  Service: Vascular;  Laterality: Left;   BELOW KNEE LEG AMPUTATION Right    CARDIOVERSION N/A 02/19/2019   Procedure: CARDIOVERSION;  Surgeon: [name], MD;  Location: [facility] ENDOSCOPY;  Service: Cardiovascular;  Laterality: N/A;   CATARACT EXTRACTION W/PHACO Right 10/23/2020   Procedure: CATARACT EXTRACTION PHACO AND INTRAOCULAR LENS PLACEMENT  ([place]);  Surgeon: Baruch Goldmann, MD;  Location: [facility];  Service: Ophthalmology;  Laterality: Right;  CDE 39.03   CHOLECYSTECTOMY     FOOT SURGERY     IR FLUORO GUIDE CV LINE RIGHT  05/16/2016   IR FLUORO GUIDE CV LINE RIGHT  02/16/2019   IR REMOVAL TUN CV CATH W/O FL  05/16/2016   IR REMOVAL TUN CV CATH W/O FL  02/19/2019   IR THROMBECTOMY AV FISTULA W/THROMBOLYSIS/PTA INC/SHUNT/IMG LEFT Left 11/09/2018   IR THROMBECTOMY AV FISTULA W/THROMBOLYSIS/PTA INC/SHUNT/IMG LEFT Left 06/22/2019   IR US GUIDE VASC ACCESS LEFT  11/09/2018   IR US GUIDE VASC ACCESS LEFT  06/22/2019   IR US GUIDE VASC ACCESS RIGHT  05/16/2016   IR US GUIDE VASC ACCESS RIGHT  02/16/2019   PERIPHERAL VASCULAR BALLOON ANGIOPLASTY  10/09/2018   Procedure: PERIPHERAL VASCULAR BALLOON ANGIOPLASTY;  Surgeon: Serafina Mitchell, MD;  Location: [place] [facility];  Service: Cardiovascular;;   TEE WITHOUT CARDIOVERSION N/A 02/19/2019   Procedure: TRANSESOPHAGEAL ECHOCARDIOGRAM (TEE);  Surgeon: [name], MD;  Location: [facility] ENDOSCOPY;  Service: Cardiovascular;  Laterality: N/A;       Family History  Problem Relation Age of Onset   Cancer Mother        lung   Diabetes Mother    Heart attack Father    Diabetes Father    Diabetes Sister     Social History   Tobacco Use   Smoking status: Some Days    Types: Cigarettes    Last attempt to quit: 09/03/2006    Years since quitting: 14.2   Smokeless tobacco: Never  Vaping Use   Vaping Use: Never used  Substance Use Topics   Alcohol use: No   Drug use: No    Home Medications Prior to Admission medications   Medication Sig Start Date End Date Taking? Authorizing Provider  albuterol (PROVENTIL) (2.5 MG/3ML) 0.083% nebulizer solution Take 2.5 mg by nebulization every 6 (six) hours as needed for wheezing or shortness of breath.   Yes [provider]  albuterol (VENTOLIN HFA) 108 (90 Base) MCG/ACT inhaler Inhale 2 puffs into the lungs every 4 (four) hours as needed for wheezing  or shortness of breath. 07/21/20  Yes Manuella Ghazi, Pratik D, DO  apixaban (ELIQUIS) 5 MG TABS tablet Take 1 tablet (5 mg total) by mouth 2 (two) times daily. 08/23/19  Yes Strader, Fransisco Hertz, PA-C  aspirin 81 MG chewable tablet Chew 81 mg by mouth daily.   Yes [provider]  atorvastatin (LIPITOR) 10 MG tablet Take 1 tablet (10 mg total) by mouth every evening. 08/03/19  Yes Barton Dubois, MD  butalbital-acetaminophen-caffeine (FIORICET) 50-325-40 MG tablet Take 1 tablet by mouth every 6 (six) hours as needed for headache or migraine. 07/21/20  Yes Shah, Pratik D, DO  calcium acetate (PHOSLO) 667 MG capsule Take 1 capsule (667 mg total) by mouth 3 (three) times daily with meals. 02/21/19  Yes Donnamae Jude, MD  calcium carbonate (TUMS -  DOSED IN MG ELEMENTAL CALCIUM) 500 MG chewable tablet Chew 4 tablets (800 mg of elemental calcium total) by mouth 2 (two) times daily. 08/03/19  Yes Barton Dubois, MD  carvedilol (COREG) 3.125 MG tablet Take 1 tablet (3.125 mg total) by mouth 2 (two) times daily with a meal. 04/09/20 11/26/20 Yes Shahmehdi, Valeria Batman, MD  doxycycline (VIBRAMYCIN) 100 MG capsule One po bid 11/26/20  Yes Milton Ferguson, MD  DULoxetine (CYMBALTA) 30 MG capsule Take 30 mg by mouth 2 (two) times daily. 12/26/19  Yes [provider]  guaiFENesin-dextromethorphan (ROBITUSSIN DM) 100-10 MG/5ML syrup Take 5 mLs by mouth every 6 (six) hours as needed for cough. 07/28/20  Yes Barton Dubois, MD  HYDROcodone-acetaminophen (NORCO/VICODIN) 5-325 MG tablet Take 1 tablet by mouth every 6 (six) hours as needed for moderate pain. 11/26/20  Yes Milton Ferguson, MD  LANTUS SOLOSTAR 100 UNIT/ML Solostar Pen Inject 3 Units into the skin at bedtime. 01/18/19  Yes [provider]  lidocaine-prilocaine (EMLA) cream Apply 1 application topically every Monday, Wednesday, and Friday with hemodialysis. 05/06/19  Yes [provider]  multivitamin (RENA-VIT) TABS tablet Take 1 tablet by mouth  daily. 08/04/18  Yes [provider]  pantoprazole (PROTONIX) 20 MG tablet Take 1 tablet (20 mg total) by mouth daily. 11/26/20  Yes Milton Ferguson, MD  patiromer (VELTASSA) 8.4 g packet Take 1 packet (8.4 g total) by mouth 3 (three) times a week. On Tuesday, Thursday and Saturday 06/10/19  Yes Kathie Dike, MD    Allergies    Tape  Review of Systems   Review of Systems  Constitutional:  Negative for appetite change and fatigue.  HENT:  Negative for congestion, ear discharge and sinus pressure.   Eyes:  Negative for discharge.  Respiratory:  Negative for cough.   Cardiovascular:  Positive for chest pain.  Gastrointestinal:  Positive for abdominal pain. Negative for diarrhea.  Genitourinary:  Negative for frequency and hematuria.  Musculoskeletal:  Negative for back pain.  Skin:  Negative for rash.  Neurological:  Negative for seizures and headaches.  Psychiatric/Behavioral:  Negative for hallucinations.    Physical Exam Updated Vital Signs BP (!) 141/90   Pulse 89   Temp 97.9 F (36.6 C)   Resp 17   Ht '5\' 9"'$  (1.753 m)   Wt 90.7 kg   SpO2 100%   BMI 29.53 kg/m   Physical Exam Nursing note reviewed.  Constitutional:      Appearance: He is well-developed.  HENT:     Head: Normocephalic.     Nose: Nose normal.  Eyes:     General: No scleral icterus.    Conjunctiva/sclera: Conjunctivae normal.  Neck:     Thyroid: No thyromegaly.  Cardiovascular:     Rate and Rhythm: Normal rate and regular rhythm.     Heart sounds: No murmur heard.   No friction rub. No gallop.  Pulmonary:     Breath sounds: No stridor. No wheezing or rales.  Chest:     Chest wall: No tenderness.  Abdominal:     General: There is no distension.     Tenderness: There is abdominal tenderness. There is no rebound.  Musculoskeletal:        General: Normal range of motion.     Cervical back: Neck supple.  Lymphadenopathy:     Cervical: No cervical adenopathy.  Skin:    Findings: No  erythema or rash.  Neurological:     Mental Status: He is alert and oriented to  person, place, and time.     Motor: No abnormal muscle tone.     Coordination: Coordination normal.  Psychiatric:        Behavior: Behavior normal.    ED Results / Procedures / Treatments   Labs (all labs ordered are listed, but only abnormal results are displayed) Labs Reviewed  BASIC METABOLIC PANEL - Abnormal; Notable for the following components:      Result Value   Glucose, Bld 109 (*)    BUN 46 (*)    Creatinine, Ser 10.86 (*)    GFR, Estimated 5 (*)    All other components within normal limits  CBC - Abnormal; Notable for the following components:   RBC 2.88 (*)    Hemoglobin 9.7 (*)    HCT 30.7 (*)    MCV 106.6 (*)    RDW 18.3 (*)    All other components within normal limits  HEPATIC FUNCTION PANEL - Abnormal; Notable for the following components:   Total Protein 6.4 (*)    Albumin 3.3 (*)    AST 14 (*)    All other components within normal limits  TROPONIN I (HIGH SENSITIVITY) - Abnormal; Notable for the following components:   Troponin I (High Sensitivity) 44 (*)    All other components within normal limits  TROPONIN I (HIGH SENSITIVITY) - Abnormal; Notable for the following components:   Troponin I (High Sensitivity) 44 (*)    All other components within normal limits  LIPASE, BLOOD    EKG EKG Interpretation  Date/Time:  Thursday November 26 2020 06:43:08 EDT Ventricular Rate:  90 PR Interval:  202 QRS Duration: 134 QT Interval:  408 QTC Calculation: 500 R Axis:   -52 Text Interpretation: Sinus rhythm Borderline prolonged PR interval Left bundle branch block Confirmed by Milton Ferguson [phone]) on 11/26/2020 7:30:11 AM  Radiology DG Chest Portable 1 View  Result Date: 11/26/2020 CLINICAL DATA:  Pt c/o CP and SOB, hx of CHF, current smoker. Pt also states he missed dialysis yesterday due to left foot pain. EXAM: PORTABLE CHEST - 1 VIEW COMPARISON:  11/02/2020 FINDINGS:  Lungs clear. Mild cardiomegaly stable.  Aortic Atherosclerosis (ICD10-170.0). No effusion.  No pneumothorax. Visualized bones unremarkable. IMPRESSION: Stable mild cardiomegaly.  No acute findings. Electronically Signed   By: Lucrezia Europe M.D.   On: 11/26/2020 07:17   DG Foot Complete Left  Result Date: 11/26/2020 CLINICAL DATA:  Pain and swelling EXAM: LEFT FOOT - COMPLETE 3+ VIEW COMPARISON:  None available FINDINGS: Previous amputation across the first and second MTP joints. Attenuated proximal phalanx of the third digit, well corticated. No definite cortical destruction to suggest osteomyelitis. Diffuse osteopenia. Patchy vascular calcifications. Chronic appearing subtalar collapse, incompletely evaluated. Regional soft tissues unremarkable. IMPRESSION: Postoperative and degenerative changes as above. No definite acute findings. Electronically Signed   By: Lucrezia Europe M.D.   On: 11/26/2020 08:28   CT Renal Stone Study  Result Date: 11/26/2020 CLINICAL DATA:  50 year old male with mid abdominal and flank pain since overnight. Dialysis patient, but missed dialysis yesterday. EXAM: CT ABDOMEN AND PELVIS WITHOUT CONTRAST TECHNIQUE: Multidetector CT imaging of the abdomen and pelvis was performed following the standard protocol without IV contrast. COMPARISON:  CT Abdomen and Pelvis 11/02/2018. FINDINGS: Lower chest: Mild cardiomegaly since 2020. No pericardial effusion. Trace right pleural effusion, decreased from that seen in 2020. No significant left pleural fluid. Mild septal thickening at both lung bases. Hepatobiliary: Absent gallbladder. Trace perihepatic free fluid. Negative noncontrast liver. Pancreas:  Partially atrophied. Difficult to exclude mild inflammation at the pancreatic head, although the other segments appear within normal limits. No pancreatic ductal dilatation. Spleen: Small volume of adjacent free fluid. Splenic calcified atherosclerosis. Adrenals/Urinary Tract: Stable adrenal glands.  Atrophied kidneys with renal vascular calcifications. No hydronephrosis or hydroureter. Decompressed bladder. Small volume of free Stomach/Bowel: Decompressed rectal which might explain the appearance of circumferential wall thickening on series 2, image 82. Redundant but mostly decompressed sigmoid colon. Trace free fluid in the left gutter. Decompressed adjacent descending colon. Negative transverse colon. Negative right colon and appendix (series 2, image 66). Negative terminal ileum. No dilated small bowel. The stomach and duodenum are partially fluid-filled but relatively decompressed. No free air. There is inflammatory stranding adjacent to the proximal duodenum (series 2, image 30), also mildly involving the celiac axis. Other small bowel loops appear within normal limits. Vascular/Lymphatic: Diffuse calcified atherosclerosis. Normal caliber abdominal aorta. Vascular patency is not evaluated in the absence of IV contrast. No lymphadenopathy. Reproductive: Negative. Other: Mild presacral stranding. Small volume of free fluid in the pelvis with simple fluid density. Musculoskeletal: Widespread advanced vertebral endplate degeneration, progressed since 2020 and likely in part spondyloarthropathy of hemodialysis. Underlying mild renal osteodystrophy changes in bone mineralization. There is a chronic/congenital nondisplaced right L5 pars fracture. Stable mild L5-S1 grade 1 anterolisthesis. Mild T11 superior endplate compression is stable. Chronic partial SI joint ankylosis. No acute or suspicious osseous lesion identified. Mild generalized body wall edema, new since 2020. IMPRESSION: 1. Inflammatory stranding adjacent to the proximal duodenum. Unclear whether this is acute peptic ulcer disease, duodenitis, or focal acute Pancreatitis of the pancreatic head. No free air. 2. Small volume of free fluid scattered in the abdomen and pelvis, favor secondary to anasarca - with mild body wall edema, trace right pleural  fluid, and mild pulmonary septal thickening at the lung bases. 3. Mild cardiomegaly is new since 2020. 4. Other sequelae of End stage renal disease including advanced atherosclerosis, osteodystrophy, spondyloarthropathy of hemodialysis. Electronically Signed   By: Genevie Ann M.D.   On: 11/26/2020 08:36    Procedures Procedures   Medications Ordered in ED Medications  HYDROcodone-acetaminophen (NORCO/VICODIN) 5-325 MG per tablet 1 tablet (has no administration in time range)    ED Course  I have reviewed the triage vital signs and the nursing notes.  Pertinent labs & imaging results that were available during my care of the patient were reviewed by me and considered in my medical decision making (see chart for details).    MDM Rules/Calculators/A&P                           Patient with abdominal pain epigastric pain and redness to the left foot.  Doubt coronary artery disease.  Patient has a stable troponin.  CT scan shows peptic ulcer problems.  Patient will be placed on protonic and given some pain medicine.  Also his left foot may have an early cellulitis.  He is placed on doxycycline for that and will follow up with PCP Final Clinical Impression(s) / ED Diagnoses Final diagnoses:  Pain of upper abdomen    Rx / DC Orders ED Discharge Orders          Ordered    pantoprazole (PROTONIX) 20 MG tablet  Daily        11/26/20 0943    doxycycline (VIBRAMYCIN) 100 MG capsule        11/26/20 0943    HYDROcodone-acetaminophen (NORCO/VICODIN) 5-325  MG tablet  Every 6 hours PRN        11/26/20 0943             Milton Ferguson, MD 11/26/20 [phone]

## 2020-11-26 NOTE — Discharge Instructions (Addendum)
Follow-up with your family doctor next week for recheck.  Make sure you get dialysis tomorrow.

## 2020-12-07 ENCOUNTER — Emergency Department (HOSPITAL_COMMUNITY): Payer: Medicaid Other

## 2020-12-07 ENCOUNTER — Other Ambulatory Visit: Payer: Self-pay

## 2020-12-07 ENCOUNTER — Emergency Department (HOSPITAL_COMMUNITY)
Admission: EM | Admit: 2020-12-07 | Discharge: 2020-12-07 | Disposition: A | Discharge status: Home / Self Care | Payer: Medicaid Other | Attending: Emergency Medicine | Admitting: Emergency Medicine

## 2020-12-07 ENCOUNTER — Encounter (HOSPITAL_COMMUNITY): Payer: Self-pay | Admitting: Emergency Medicine

## 2020-12-07 DIAGNOSIS — I132 Hypertensive heart and chronic kidney disease with heart failure and with stage 5 chronic kidney disease, or end stage renal disease: Secondary | ICD-10-CM | POA: Diagnosis not present

## 2020-12-07 DIAGNOSIS — Z7901 Long term (current) use of anticoagulants: Secondary | ICD-10-CM | POA: Insufficient documentation

## 2020-12-07 DIAGNOSIS — F17200 Nicotine dependence, unspecified, uncomplicated: Secondary | ICD-10-CM | POA: Diagnosis not present

## 2020-12-07 DIAGNOSIS — I5043 Acute on chronic combined systolic (congestive) and diastolic (congestive) heart failure: Secondary | ICD-10-CM | POA: Diagnosis not present

## 2020-12-07 DIAGNOSIS — L03116 Cellulitis of left lower limb: Secondary | ICD-10-CM | POA: Diagnosis not present

## 2020-12-07 DIAGNOSIS — Z89511 Acquired absence of right leg below knee: Secondary | ICD-10-CM | POA: Insufficient documentation

## 2020-12-07 DIAGNOSIS — R21 Rash and other nonspecific skin eruption: Secondary | ICD-10-CM | POA: Insufficient documentation

## 2020-12-07 DIAGNOSIS — N186 End stage renal disease: Secondary | ICD-10-CM | POA: Insufficient documentation

## 2020-12-07 DIAGNOSIS — M79672 Pain in left foot: Secondary | ICD-10-CM | POA: Diagnosis present

## 2020-12-07 DIAGNOSIS — Z992 Dependence on renal dialysis: Secondary | ICD-10-CM | POA: Insufficient documentation

## 2020-12-07 DIAGNOSIS — Z8616 Personal history of COVID-19: Secondary | ICD-10-CM | POA: Diagnosis not present

## 2020-12-07 DIAGNOSIS — E1122 Type 2 diabetes mellitus with diabetic chronic kidney disease: Secondary | ICD-10-CM | POA: Insufficient documentation

## 2020-12-07 LAB — CBC WITH DIFFERENTIAL/PLATELET
Abs Immature Granulocytes: 0.01 10*3/uL (ref 0.00–0.07)
Basophils Absolute: 0 10*3/uL (ref 0.0–0.1)
Basophils Relative: 1 %
Eosinophils Absolute: 0.2 10*3/uL (ref 0.0–0.5)
Eosinophils Relative: 3 %
HCT: 33.1 % — ABNORMAL LOW (ref 39.0–52.0)
Hemoglobin: 10.5 g/dL — ABNORMAL LOW (ref 13.0–17.0)
Immature Granulocytes: 0 %
Lymphocytes Relative: 18 %
Lymphs Abs: 1 10*3/uL (ref 0.7–4.0)
MCH: 33.4 pg (ref 26.0–34.0)
MCHC: 31.7 g/dL (ref 30.0–36.0)
MCV: 105.4 fL — ABNORMAL HIGH (ref 80.0–100.0)
Monocytes Absolute: 0.3 10*3/uL (ref 0.1–1.0)
Monocytes Relative: 6 %
Neutro Abs: 3.9 10*3/uL (ref 1.7–7.7)
Neutrophils Relative %: 72 %
Platelets: 167 10*3/uL (ref 150–400)
RBC: 3.14 MIL/uL — ABNORMAL LOW (ref 4.22–5.81)
RDW: 18.7 % — ABNORMAL HIGH (ref 11.5–15.5)
WBC: 5.4 10*3/uL (ref 4.0–10.5)
nRBC: 0 % (ref 0.0–0.2)

## 2020-12-07 LAB — COMPREHENSIVE METABOLIC PANEL
ALT: 19 U/L (ref 0–44)
AST: 20 U/L (ref 15–41)
Albumin: 3.6 g/dL (ref 3.5–5.0)
Alkaline Phosphatase: 63 U/L (ref 38–126)
Anion gap: 12 (ref 5–15)
BUN: 29 mg/dL — ABNORMAL HIGH (ref 6–20)
CO2: 28 mmol/L (ref 22–32)
Calcium: 8.2 mg/dL — ABNORMAL LOW (ref 8.9–10.3)
Chloride: 98 mmol/L (ref 98–111)
Creatinine, Ser: 7.8 mg/dL — ABNORMAL HIGH (ref 0.61–1.24)
GFR, Estimated: 8 mL/min — ABNORMAL LOW (ref >60)
Glucose, Bld: 104 mg/dL — ABNORMAL HIGH (ref 70–99)
Potassium: 2.8 mmol/L — ABNORMAL LOW (ref 3.5–5.1)
Sodium: 138 mmol/L (ref 135–145)
Total Bilirubin: 1.2 mg/dL (ref 0.3–1.2)
Total Protein: 6.7 g/dL (ref 6.5–8.1)

## 2020-12-07 IMAGING — DX DG FOOT COMPLETE 3+V*L*
3 series · 3 of 3 positions shown · non-contrast
Comparison: Radiograph [DATE]

CLINICAL DATA: Foot pain/infection. Lateral side of foot for 2
weeks.

EXAM:
LEFT FOOT - COMPLETE 3+ VIEW

[foot ap]
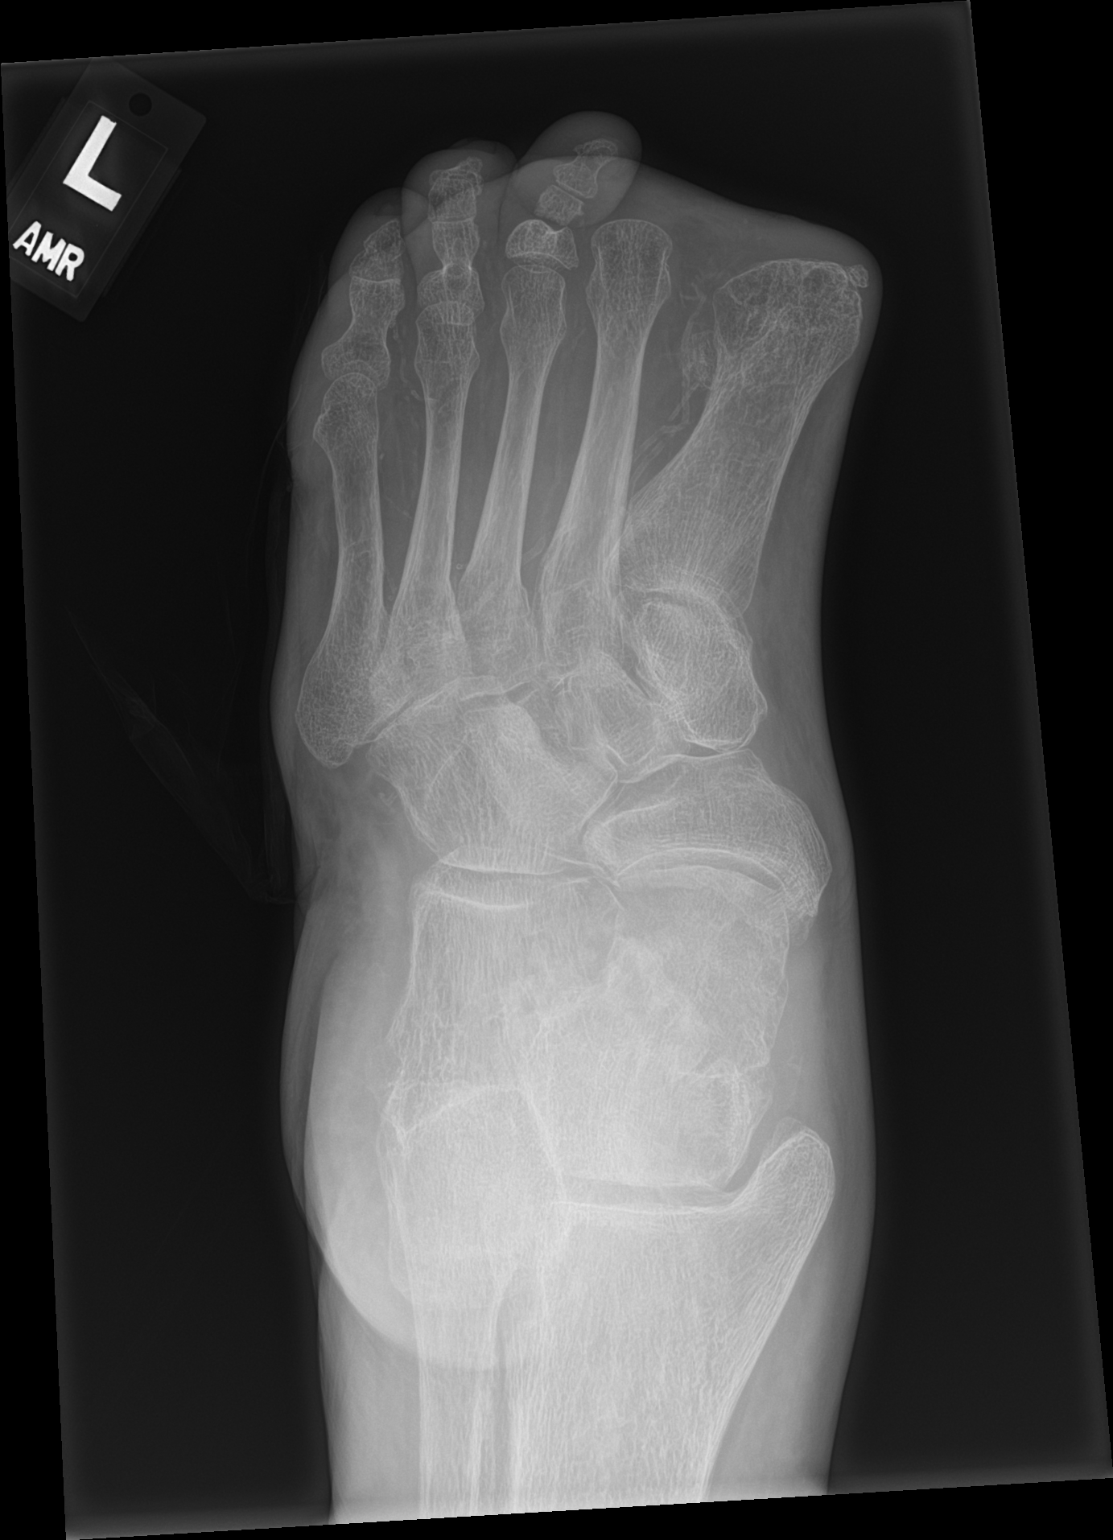

[foot obl]
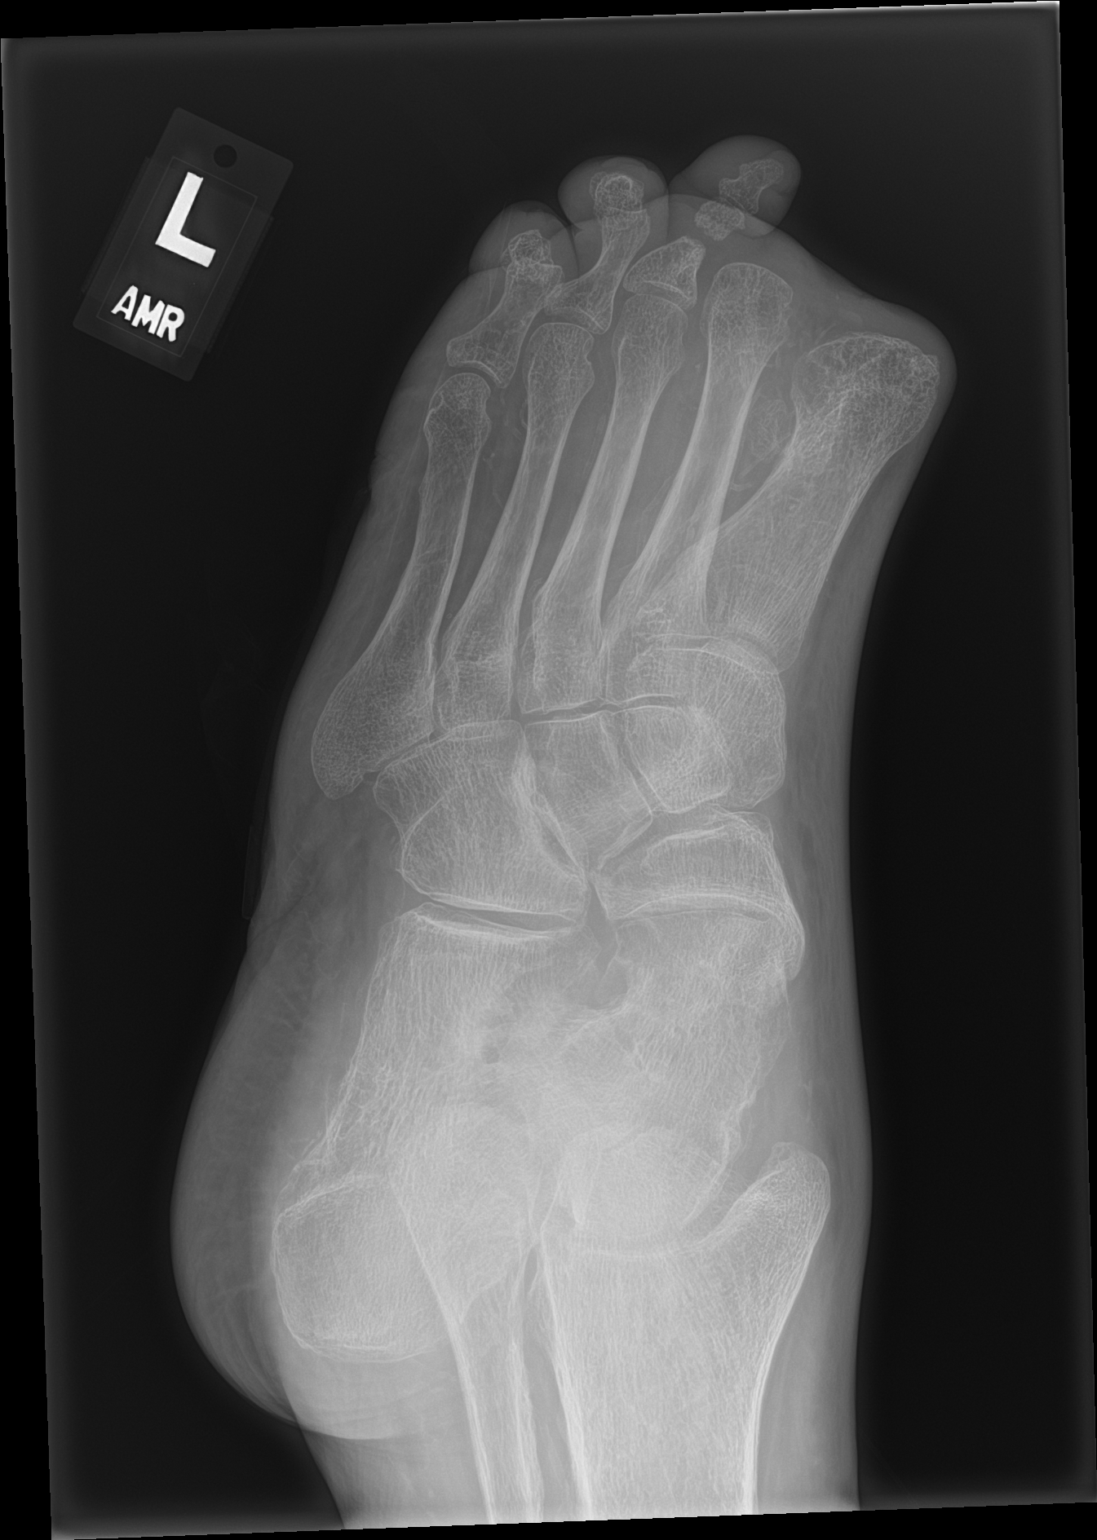

[foot lat]
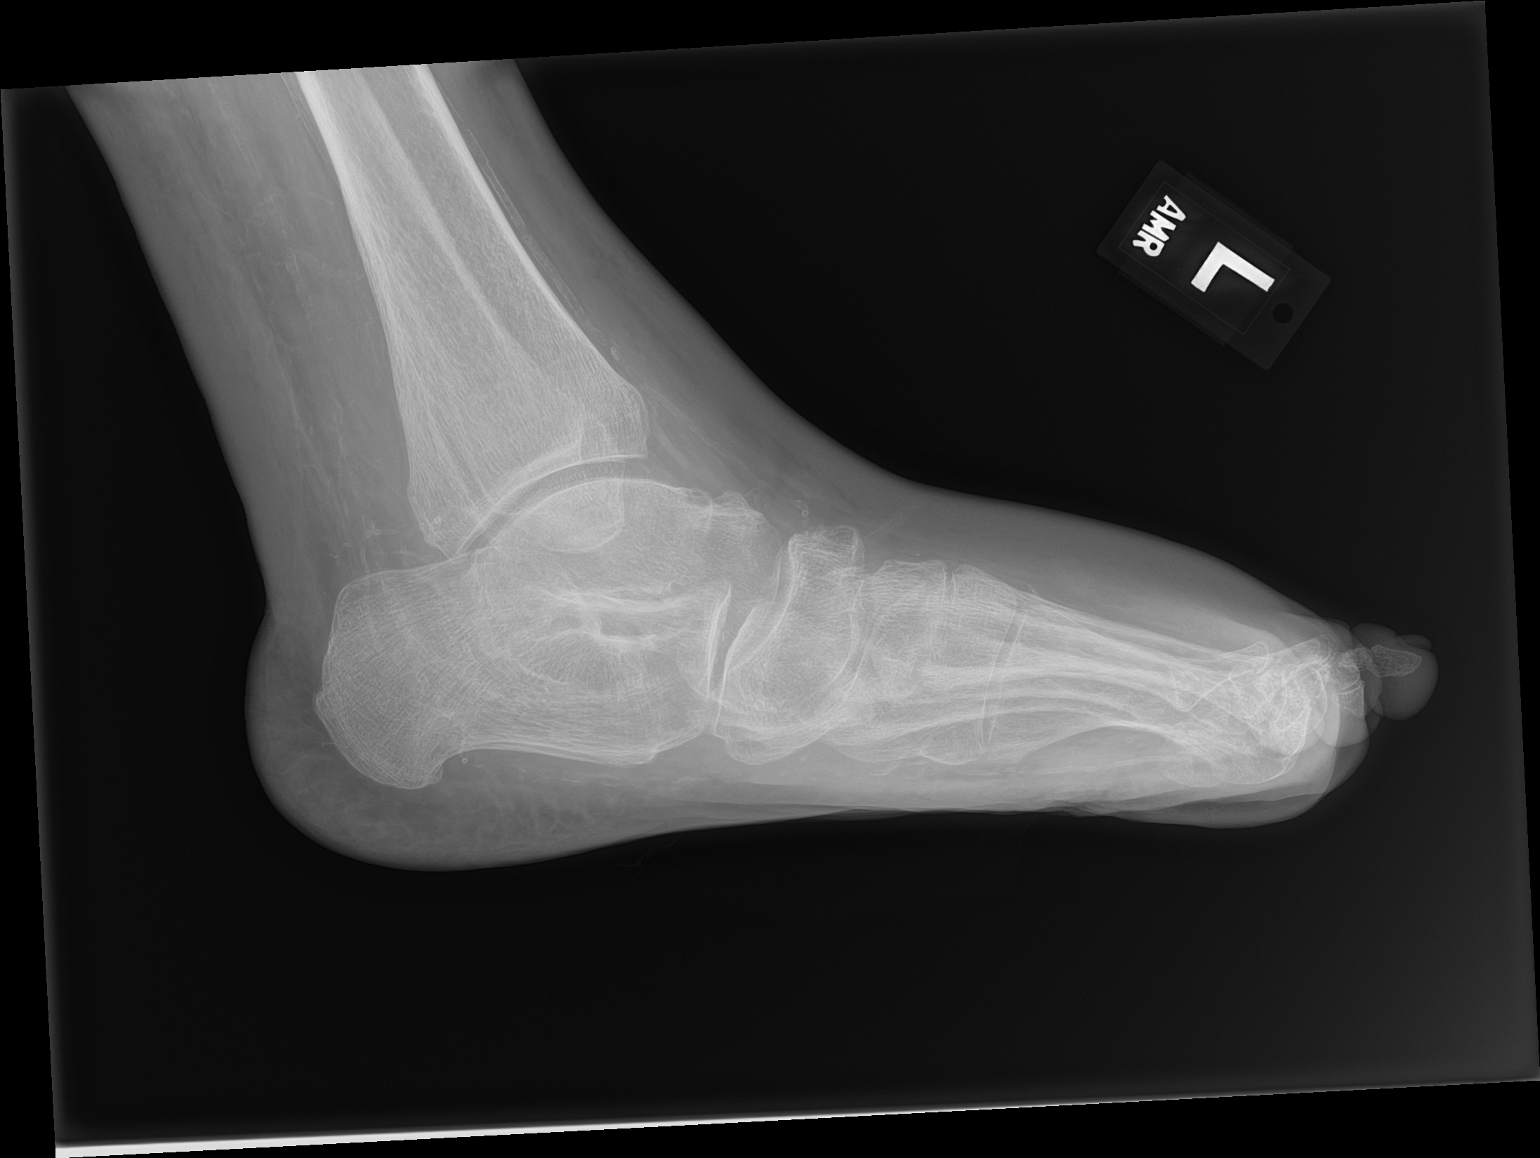

[3 of 3 positions shown; findings below may reference images not displayed]

FINDINGS: Remote resection of the first and second toes, with postsurgical
change of the third digit and appearing there is no erosion,
periosteal reaction, bony destruction or abnormal bone density to
suggest osteomyelitis. Bones are diffusely under mineralized.
Midfoot degenerative change. There is skin and soft tissue
irregularity about the lateral midfoot. No visualized radiopaque
foreign body. Advanced vascular calcifications.
IMPRESSION: 1. Skin and soft tissue irregularity about the lateral midfoot. No
radiographic findings of osteomyelitis.
2. Midfoot degenerative change.

## 2020-12-07 MED ORDER — POTASSIUM CHLORIDE CRYS ER 20 MEQ PO TBCR
40.0000 meq | EXTENDED_RELEASE_TABLET | Freq: Once | ORAL | Status: AC
  Administered 2020-12-07: 40 meq via ORAL
  Filled 2020-12-07: qty 2

## 2020-12-07 MED ORDER — SULFAMETHOXAZOLE-TRIMETHOPRIM 800-160 MG PO TABS: 1.0000 | ORAL_TABLET | Freq: Two times a day (BID) | ORAL | 0 refills | Status: AC

## 2020-12-07 NOTE — Discharge Instructions (Signed)
Please follow up for dialysis as usual. Please see your PCP or return if your rash worsens or fails to resolve.

## 2020-12-07 NOTE — ED Triage Notes (Signed)
Pt to the ED with complaints of left foot pain that has been hurting x2 wks.  Pt was at dialysis and 45 minutes into his treatment when he made them stop and call EMS.

## 2020-12-07 NOTE — ED Notes (Signed)
Pt walked out of acute facility side to waiting room without discharge papers.

## 2020-12-07 NOTE — ED Provider Notes (Signed)
[facility] EMERGENCY DEPARTMENT Provider Note   CSN: [id number] Arrival date & time: 12/07/20  1328     History Chief Complaint  Patient presents with   Foot Pain    Brendan Campbell is a 50 y.o. male with a past medical history significant for end-stage renal disease on dialysis, congestive heart failure, diabetes who presents with having to leave dialysis early after only receiving 45 minutes of his treatment today with severe left foot pain.  Patient does notably have a right below the knee amputation, and to toe amputations on the left already.  Patient reports he has had some ulcers on the lateral aspect of the foot that he was applying an ointment to which helped with the pain to some extent.  Patient reports that he has increased edema, worsening pain, worsening appearance of ulcers on the lateral aspect of the foot today.  Patient also reports an odd rash, present from head to toe which is excoriated in nature.  Patient has many cats and reports that he thinks that this may be related to his cats but it extends all the way of his back which makes him think that there could be something else going on.   Foot Pain      Past Medical History:  Diagnosis Date   Anemia    Anxiety    Blood transfusion without reported diagnosis    CHF (congestive heart failure) ([place])    a. EF 25% by echo in 07/2019   Chronic kidney disease    Depression    Diabetes mellitus without complication ([place])    End stage renal disease ([place])    M/W/F [facility]   Hyperlipidemia    Neuropathy    Peripheral vascular disease (HCC)    PTSD (post-traumatic stress disorder)     Patient Active Problem List   Diagnosis Date Noted   Chronic systolic CHF (congestive heart failure) ([place]) 07/27/2020   Volume overload 07/20/2020   COVID-19 virus infection 07/20/2020   Acute on chronic respiratory failure with hypoxia ([place]) 07/03/2020   Acute on chronic combined systolic and diastolic CHF (congestive heart  failure) ([place]) 04/08/2020   Left hemiparesis ([place]) 04/08/2020   Lumbar spondylosis 04/08/2020   Atrial fibrillation with RVR ([place]) 04/08/2020   Elevated troponin 04/08/2020   Pain in limb 12/29/2019   Headache disorder 12/29/2019   Pain in left arm 12/29/2019   Cervical radiculopathy 12/29/2019   Acute congestive heart failure ([place]) 12/13/2019   Macrocytic anemia 09/25/2019   Paresthesia 09/24/2019   Hypocalcemia    Chronic atrial fibrillation (HCC)    Hyperparathyroidism ([place])    Cardiac arrest ([place]) 06/07/2019   Neuropathy    Peripheral vascular disease (HCC)    Prolonged QT interval    Leukocytosis    Atypical pneumonia    Acute pulmonary edema ([place])    ESRD on hemodialysis ([place])    Hyperkalemia 10/05/2018   Weakness 10/05/2018   ESRD (end stage renal disease) ([place]) 05/10/2016   Spondylosis of lumbar spine 03/08/2016   Lumbar radiculopathy 03/08/2016   S/P below knee amputation, right ([place]) 03/05/2015   Type 2 diabetes mellitus with diabetic chronic kidney disease ([place]) 03/03/2015   Hyperlipidemia 03/03/2015   Low back pain 03/03/2015    Past Surgical History:  Procedure Laterality Date   A/V FISTULAGRAM N/A 10/09/2018   Procedure: A/V FISTULAGRAM;  Surgeon: Serafina Mitchell, MD;  Location: [place] [facility];  Service: Cardiovascular;  Laterality: N/A;   AV FISTULA PLACEMENT  Left 09/22/2016   Procedure: CREATION OF LEFT ARM ARTERIOVENOUS (AV) FISTULA;  Surgeon: Waynetta Sandy, MD;  Location: [place];  Service: Vascular;  Laterality: Left;   AV FISTULA PLACEMENT Left 10/31/2017   Procedure: INSERTION OF ARTERIOVENOUS (AV) GORE-TEX 4-43mm STETCH GRAFT LEFT ARM;  Surgeon: Waynetta Sandy, MD;  Location: [place];  Service: Vascular;  Laterality: Left;   [place] Left 08/17/2017   Procedure: SECOND STAGE BASILIC VEIN TRANSPOSITION LEFT ARM;  Surgeon: Waynetta Sandy, MD;  Location: [place];  Service: Vascular;  Laterality: Left;   BELOW  KNEE LEG AMPUTATION Right    CARDIOVERSION N/A 02/19/2019   Procedure: CARDIOVERSION;  Surgeon: [name], MD;  Location: [facility] ENDOSCOPY;  Service: Cardiovascular;  Laterality: N/A;   CATARACT EXTRACTION W/PHACO Right 10/23/2020   Procedure: CATARACT EXTRACTION PHACO AND INTRAOCULAR LENS PLACEMENT ([place]);  Surgeon: Baruch Goldmann, MD;  Location: [facility];  Service: Ophthalmology;  Laterality: Right;  CDE 39.03   CHOLECYSTECTOMY     FOOT SURGERY     IR FLUORO GUIDE CV LINE RIGHT  05/16/2016   IR FLUORO GUIDE CV LINE RIGHT  02/16/2019   IR REMOVAL TUN CV CATH W/O FL  05/16/2016   IR REMOVAL TUN CV CATH W/O FL  02/19/2019   IR THROMBECTOMY AV FISTULA W/THROMBOLYSIS/PTA INC/SHUNT/IMG LEFT Left 11/09/2018   IR THROMBECTOMY AV FISTULA W/THROMBOLYSIS/PTA INC/SHUNT/IMG LEFT Left 06/22/2019   IR US GUIDE VASC ACCESS LEFT  11/09/2018   IR US GUIDE VASC ACCESS LEFT  06/22/2019   IR US GUIDE VASC ACCESS RIGHT  05/16/2016   IR US GUIDE VASC ACCESS RIGHT  02/16/2019   PERIPHERAL VASCULAR BALLOON ANGIOPLASTY  10/09/2018   Procedure: PERIPHERAL VASCULAR BALLOON ANGIOPLASTY;  Surgeon: Serafina Mitchell, MD;  Location: [place] CV LAB;  Service: Cardiovascular;;   TEE WITHOUT CARDIOVERSION N/A 02/19/2019   Procedure: TRANSESOPHAGEAL ECHOCARDIOGRAM (TEE);  Surgeon: [name], MD;  Location: [facility] ENDOSCOPY;  Service: Cardiovascular;  Laterality: N/A;       Family History  Problem Relation Age of Onset   Cancer Mother        lung   Diabetes Mother    Heart attack Father    Diabetes Father    Diabetes Sister     Social History   Tobacco Use   Smoking status: Some Days    Types: Cigarettes    Last attempt to quit: 09/03/2006    Years since quitting: 14.2   Smokeless tobacco: Never  Vaping Use   Vaping Use: Never used  Substance Use Topics   Alcohol use: No   Drug use: No    Home Medications Prior to Admission medications   Medication Sig Start Date End Date Taking? Authorizing Provider  albuterol  (VENTOLIN HFA) 108 (90 Base) MCG/ACT inhaler Inhale 2 puffs into the lungs every 4 (four) hours as needed for wheezing or shortness of breath. 07/21/20  Yes Manuella Ghazi, Pratik D, DO  apixaban (ELIQUIS) 5 MG TABS tablet Take 1 tablet (5 mg total) by mouth 2 (two) times daily. 08/23/19  Yes Strader, Fransisco Hertz, PA-C  aspirin 81 MG chewable tablet Chew 81 mg by mouth daily.   Yes [provider]  atorvastatin (LIPITOR) 10 MG tablet Take 1 tablet (10 mg total) by mouth every evening. Patient taking differently: Take 10 mg by mouth every evening. Take 1 tablet on Monday, Wednesday and Friday. 08/03/19  Yes Barton Dubois, MD  butalbital-acetaminophen-caffeine (FIORICET) 4422005881 MG tablet Take 1 tablet by  mouth every 6 (six) hours as needed for headache or migraine. 07/21/20  Yes Shah, Pratik D, DO  calcium acetate (PHOSLO) 667 MG capsule Take 1 capsule (667 mg total) by mouth 3 (three) times daily with meals. 02/21/19  Yes Donnamae Jude, MD  calcium carbonate (TUMS - DOSED IN MG ELEMENTAL CALCIUM) 500 MG chewable tablet Chew 4 tablets (800 mg of elemental calcium total) by mouth 2 (two) times daily. 08/03/19  Yes Barton Dubois, MD  Cholecalciferol (VITAMIN D3) 25 MCG (1000 UT) CAPS Take 1 capsule by mouth See admin instructions. Take before Dialysis on Monday Wednesday and Fridays   Yes [provider]  pantoprazole (PROTONIX) 20 MG tablet Take 1 tablet (20 mg total) by mouth daily. 11/26/20  Yes Milton Ferguson, MD  patiromer (VELTASSA) 8.4 g packet Take 1 packet (8.4 g total) by mouth 3 (three) times a week. On Tuesday, Thursday and Saturday 06/10/19  Yes Kathie Dike, MD  sulfamethoxazole-trimethoprim (BACTRIM DS) 800-160 MG tablet Take 1 tablet by mouth 2 (two) times daily for 7 days. 12/07/20 12/14/20 Yes Javarious Elsayed H, PA-C  albuterol (PROVENTIL) (2.5 MG/3ML) 0.083% nebulizer solution Take 2.5 mg by nebulization every 6 (six) hours as needed for wheezing or shortness of breath. Patient  not taking: Reported on 12/07/2020    [provider]  carvedilol (COREG) 3.125 MG tablet Take 1 tablet (3.125 mg total) by mouth 2 (two) times daily with a meal. Patient not taking: Reported on 12/07/2020 04/09/20 11/26/20  Deatra Truxton, MD  doxycycline (VIBRAMYCIN) 100 MG capsule One po bid Patient not taking: No sig reported 11/26/20   Milton Ferguson, MD  DULoxetine (CYMBALTA) 30 MG capsule Take 30 mg by mouth 2 (two) times daily. Patient not taking: Reported on 12/07/2020 12/26/19   [provider]  guaiFENesin-dextromethorphan (ROBITUSSIN DM) 100-10 MG/5ML syrup Take 5 mLs by mouth every 6 (six) hours as needed for cough. Patient not taking: Reported on 12/07/2020 07/28/20   Barton Dubois, MD  HYDROcodone-acetaminophen (NORCO/VICODIN) 5-325 MG tablet Take 1 tablet by mouth every 6 (six) hours as needed for moderate pain. Patient not taking: No sig reported 11/26/20   Milton Ferguson, MD  LANTUS SOLOSTAR 100 UNIT/ML Solostar Pen Inject 3 Units into the skin at bedtime. Patient not taking: Reported on 12/07/2020 01/18/19   [provider]  lidocaine-prilocaine (EMLA) cream Apply 1 application topically every Monday, Wednesday, and Friday with hemodialysis. Patient not taking: Reported on 12/07/2020 05/06/19   [provider]  multivitamin (RENA-VIT) TABS tablet Take 1 tablet by mouth daily. Patient not taking: Reported on 12/07/2020 08/04/18   [provider]    Allergies    Tape  Review of Systems   Review of Systems  Skin:  Positive for rash and wound.  All other systems reviewed and are negative.  Physical Exam Updated Vital Signs BP 113/89   Pulse 87   Temp 97.8 F (36.6 C) (Oral)   Resp 16   Ht 5\' 9"  (1.753 m)   Wt 90.7 kg   SpO2 96%   BMI 29.53 kg/m   Physical Exam Vitals and nursing note reviewed.  Constitutional:      General: He is not in acute distress.    Appearance: Normal appearance.  HENT:     Head:  Normocephalic and atraumatic.  Eyes:     General:        Right eye: No discharge.        Left eye: No discharge.  Cardiovascular:  Rate and Rhythm: Normal rate and regular rhythm.     Pulses: Normal pulses.     Heart sounds: No murmur heard.   No friction rub. No gallop.  Pulmonary:     Effort: Pulmonary effort is normal.     Breath sounds: Normal breath sounds.  Abdominal:     General: Bowel sounds are normal.     Palpations: Abdomen is soft.     Tenderness: There is no abdominal tenderness.  Musculoskeletal:     Comments: Right leg with BKA amputation, left leg with 1st and 2nd toe missing, can wiggle toes without issue  Skin:    General: Skin is warm and dry.     Capillary Refill: Capillary refill takes less than 2 seconds.     Comments: Linear excoriated rash located all over the body, concentrated on the lower legs, and back, no evidence of vesiculation, or infection rash is minimally itchy, not painful to the touch.  Not in dermatomal distribution.  Not petechial in nature.  Patient has a few small ulcers on the lateral aspect of the left foot, with some redness and irritation surrounding the affected tissue, no redness tracking up proximally from the wound.  Some tenderness to palpation.  Patient is otherwise no sensation from the level of the mid calf down through the entire foot.  Neurological:     Mental Status: He is alert and oriented to person, place, and time.  Psychiatric:        Mood and Affect: Mood normal.        Behavior: Behavior normal.    ED Results / Procedures / Treatments   Labs (all labs ordered are listed, but only abnormal results are displayed) Labs Reviewed  CBC WITH DIFFERENTIAL/PLATELET - Abnormal; Notable for the following components:      Result Value   RBC 3.14 (*)    Hemoglobin 10.5 (*)    HCT 33.1 (*)    MCV 105.4 (*)    RDW 18.7 (*)    All other components within normal limits  COMPREHENSIVE METABOLIC PANEL - Abnormal; Notable for  the following components:   Potassium 2.8 (*)    Glucose, Bld 104 (*)    BUN 29 (*)    Creatinine, Ser 7.80 (*)    Calcium 8.2 (*)    GFR, Estimated 8 (*)    All other components within normal limits    EKG None  Radiology DG Foot Complete Left  Result Date: 12/07/2020 CLINICAL DATA:  Foot pain/infection. Lateral side of foot for 2 weeks. EXAM: LEFT FOOT - COMPLETE 3+ VIEW COMPARISON:  Radiograph 11/26/2020 FINDINGS: Remote resection of the first and second toes, with postsurgical change of the third digit and appearing there is no erosion, periosteal reaction, bony destruction or abnormal bone density to suggest osteomyelitis. Bones are diffusely under mineralized. Midfoot degenerative change. There is skin and soft tissue irregularity about the lateral midfoot. No visualized radiopaque foreign body. Advanced vascular calcifications. IMPRESSION: 1. Skin and soft tissue irregularity about the lateral midfoot. No radiographic findings of osteomyelitis. 2. Midfoot degenerative change. Electronically Signed   By: Keith Rake M.D.   On: 12/07/2020 15:04    Procedures Procedures   Medications Ordered in ED Medications  potassium chloride SA (KLOR-CON) CR tablet 40 mEq (40 mEq Oral Given 12/07/20 1620)    ED Course  I have reviewed the triage vital signs and the nursing notes.  Pertinent labs & imaging results that were available during my care of  the patient were reviewed by me and considered in my medical decision making (see chart for details).    MDM Rules/Calculators/A&P                         I discussed this case with my attending physician who cosigned this note including patient's presenting symptoms, physical exam, and planned diagnostics and interventions. Attending physician stated agreement with plan or made changes to plan which were implemented.   Attending physician assessed patient at bedside.  Dialysis patient with baseline complications of diabetes,  peripheral vascular disease, multiple amputations for peripheral vascular disease, diabetes of the lower extremities.  Presents with excruciating pain of the left foot today at dialysis, there is some evidence of cellulitis infection of the left foot.  He does have intact DP, PT pulses of the left foot, identifiable on Doppler.  Patient has an odd rash throughout the entire body, that appears like small linear excoriations.  Patient reports that he has 5 cats, has had as many as 18 cats thinks that it may be from his cats, it is minimally itchy, not painful, does not appear infected.  No meningeal signs, does not appear petechial in nature.  Radiographic imaging of the left foot does not reveal any signs of osteomyelitis, screening lab work is remarkable only for hypokalemia to 2.8.  We will administer 40 mEq of potassium.  Otherwise ulcerations of the left foot appear to be cellulitic in nature, without signs of septicemia or bacteremia, we will send patient home with prescription for Bactrim, encouraged follow-up with primary care doctor, regular dialysis appointments.  Patient understands and agrees to plan at this time.  Return precautions were given. Final Clinical Impression(s) / ED Diagnoses Final diagnoses:  Foot pain, left  Cellulitis of left lower extremity    Rx / DC Orders ED Discharge Orders          Ordered    sulfamethoxazole-trimethoprim (BACTRIM DS) 800-160 MG tablet  2 times daily        12/07/20 1700             Anselmo Pickler, PA-C 12/07/20 1715    [name], MD 12/13/20 [phone]

## 2020-12-07 NOTE — ED Notes (Signed)
Pt with complaints of shooting pain to left foot up his leg into groin area. Right foot below knee amputation. Ulceration x2 (approx 1in diameter and 1.5 in diameter) to proximal area of left foot. Pedal pulse present with use of dopper. Lower left leg with 3+ pitting edema to foot and 1-2+ pitting up to the knee. Pt also covered in small round scabd from head to toe as well as pin point bruising. PA aware and at bedside. Pt a\ble to move toes freely but lacks sensation below knee. Left dialysis today after 89minutes. No fever or chills noted.

## 2021-02-03 ENCOUNTER — Encounter (HOSPITAL_COMMUNITY): Payer: Self-pay | Admitting: *Deleted

## 2021-02-03 ENCOUNTER — Other Ambulatory Visit: Payer: Self-pay

## 2021-02-03 ENCOUNTER — Inpatient Hospital Stay (HOSPITAL_COMMUNITY)
Admission: EM | Admit: 2021-02-03 | Discharge: 2021-02-06 | DRG: 602 | Disposition: A | Discharge status: Home / Self Care | Payer: Medicaid Other | Attending: Internal Medicine | Admitting: Internal Medicine

## 2021-02-03 DIAGNOSIS — F17210 Nicotine dependence, cigarettes, uncomplicated: Secondary | ICD-10-CM | POA: Diagnosis present

## 2021-02-03 DIAGNOSIS — Z7982 Long term (current) use of aspirin: Secondary | ICD-10-CM

## 2021-02-03 DIAGNOSIS — B9561 Methicillin susceptible Staphylococcus aureus infection as the cause of diseases classified elsewhere: Secondary | ICD-10-CM | POA: Diagnosis present

## 2021-02-03 DIAGNOSIS — F4310 Post-traumatic stress disorder, unspecified: Secondary | ICD-10-CM | POA: Diagnosis present

## 2021-02-03 DIAGNOSIS — Z8249 Family history of ischemic heart disease and other diseases of the circulatory system: Secondary | ICD-10-CM

## 2021-02-03 DIAGNOSIS — I5022 Chronic systolic (congestive) heart failure: Secondary | ICD-10-CM | POA: Diagnosis present

## 2021-02-03 DIAGNOSIS — Z20822 Contact with and (suspected) exposure to covid-19: Secondary | ICD-10-CM | POA: Diagnosis present

## 2021-02-03 DIAGNOSIS — D631 Anemia in chronic kidney disease: Secondary | ICD-10-CM | POA: Diagnosis present

## 2021-02-03 DIAGNOSIS — L03119 Cellulitis of unspecified part of limb: Secondary | ICD-10-CM | POA: Diagnosis present

## 2021-02-03 DIAGNOSIS — J9611 Chronic respiratory failure with hypoxia: Secondary | ICD-10-CM | POA: Diagnosis present

## 2021-02-03 DIAGNOSIS — Z9981 Dependence on supplemental oxygen: Secondary | ICD-10-CM

## 2021-02-03 DIAGNOSIS — Z833 Family history of diabetes mellitus: Secondary | ICD-10-CM

## 2021-02-03 DIAGNOSIS — M898X9 Other specified disorders of bone, unspecified site: Secondary | ICD-10-CM | POA: Diagnosis present

## 2021-02-03 DIAGNOSIS — E1142 Type 2 diabetes mellitus with diabetic polyneuropathy: Secondary | ICD-10-CM | POA: Diagnosis present

## 2021-02-03 DIAGNOSIS — Z992 Dependence on renal dialysis: Secondary | ICD-10-CM

## 2021-02-03 DIAGNOSIS — R7881 Bacteremia: Secondary | ICD-10-CM | POA: Diagnosis present

## 2021-02-03 DIAGNOSIS — D539 Nutritional anemia, unspecified: Secondary | ICD-10-CM | POA: Diagnosis present

## 2021-02-03 DIAGNOSIS — I132 Hypertensive heart and chronic kidney disease with heart failure and with stage 5 chronic kidney disease, or end stage renal disease: Secondary | ICD-10-CM | POA: Diagnosis present

## 2021-02-03 DIAGNOSIS — Z7901 Long term (current) use of anticoagulants: Secondary | ICD-10-CM

## 2021-02-03 DIAGNOSIS — N186 End stage renal disease: Secondary | ICD-10-CM

## 2021-02-03 DIAGNOSIS — Z79899 Other long term (current) drug therapy: Secondary | ICD-10-CM

## 2021-02-03 DIAGNOSIS — R21 Rash and other nonspecific skin eruption: Secondary | ICD-10-CM | POA: Diagnosis present

## 2021-02-03 DIAGNOSIS — L03116 Cellulitis of left lower limb: Principal | ICD-10-CM | POA: Diagnosis present

## 2021-02-03 DIAGNOSIS — E1122 Type 2 diabetes mellitus with diabetic chronic kidney disease: Secondary | ICD-10-CM | POA: Diagnosis present

## 2021-02-03 DIAGNOSIS — E1151 Type 2 diabetes mellitus with diabetic peripheral angiopathy without gangrene: Secondary | ICD-10-CM | POA: Diagnosis present

## 2021-02-03 DIAGNOSIS — Z801 Family history of malignant neoplasm of trachea, bronchus and lung: Secondary | ICD-10-CM

## 2021-02-03 DIAGNOSIS — Z89511 Acquired absence of right leg below knee: Secondary | ICD-10-CM

## 2021-02-03 DIAGNOSIS — Z794 Long term (current) use of insulin: Secondary | ICD-10-CM

## 2021-02-03 DIAGNOSIS — I482 Chronic atrial fibrillation, unspecified: Secondary | ICD-10-CM | POA: Diagnosis present

## 2021-02-03 DIAGNOSIS — E782 Mixed hyperlipidemia: Secondary | ICD-10-CM | POA: Diagnosis present

## 2021-02-03 NOTE — ED Triage Notes (Signed)
Pt with left foot pain x 3 weeks.  Pt with chills off and on since last night.  Rash to back per EMS report, x 3 weeks per pt.

## 2021-02-03 NOTE — ED Notes (Signed)
Attempted IV stick X2 without success

## 2021-02-04 ENCOUNTER — Emergency Department (HOSPITAL_COMMUNITY): Payer: Medicaid Other

## 2021-02-04 DIAGNOSIS — R7881 Bacteremia: Secondary | ICD-10-CM | POA: Diagnosis present

## 2021-02-04 DIAGNOSIS — E1122 Type 2 diabetes mellitus with diabetic chronic kidney disease: Secondary | ICD-10-CM | POA: Diagnosis present

## 2021-02-04 DIAGNOSIS — Z9981 Dependence on supplemental oxygen: Secondary | ICD-10-CM | POA: Diagnosis not present

## 2021-02-04 DIAGNOSIS — E1142 Type 2 diabetes mellitus with diabetic polyneuropathy: Secondary | ICD-10-CM | POA: Diagnosis present

## 2021-02-04 DIAGNOSIS — F4310 Post-traumatic stress disorder, unspecified: Secondary | ICD-10-CM | POA: Diagnosis present

## 2021-02-04 DIAGNOSIS — B9561 Methicillin susceptible Staphylococcus aureus infection as the cause of diseases classified elsewhere: Secondary | ICD-10-CM | POA: Diagnosis present

## 2021-02-04 DIAGNOSIS — F17210 Nicotine dependence, cigarettes, uncomplicated: Secondary | ICD-10-CM | POA: Diagnosis present

## 2021-02-04 DIAGNOSIS — N186 End stage renal disease: Secondary | ICD-10-CM

## 2021-02-04 DIAGNOSIS — E1151 Type 2 diabetes mellitus with diabetic peripheral angiopathy without gangrene: Secondary | ICD-10-CM | POA: Diagnosis present

## 2021-02-04 DIAGNOSIS — D631 Anemia in chronic kidney disease: Secondary | ICD-10-CM | POA: Diagnosis present

## 2021-02-04 DIAGNOSIS — E782 Mixed hyperlipidemia: Secondary | ICD-10-CM | POA: Diagnosis present

## 2021-02-04 DIAGNOSIS — Z992 Dependence on renal dialysis: Secondary | ICD-10-CM

## 2021-02-04 DIAGNOSIS — Z20822 Contact with and (suspected) exposure to covid-19: Secondary | ICD-10-CM | POA: Diagnosis present

## 2021-02-04 DIAGNOSIS — I38 Endocarditis, valve unspecified: Secondary | ICD-10-CM | POA: Diagnosis not present

## 2021-02-04 DIAGNOSIS — L03119 Cellulitis of unspecified part of limb: Secondary | ICD-10-CM | POA: Diagnosis present

## 2021-02-04 DIAGNOSIS — I482 Chronic atrial fibrillation, unspecified: Secondary | ICD-10-CM | POA: Diagnosis present

## 2021-02-04 DIAGNOSIS — Z89511 Acquired absence of right leg below knee: Secondary | ICD-10-CM | POA: Diagnosis not present

## 2021-02-04 DIAGNOSIS — R21 Rash and other nonspecific skin eruption: Secondary | ICD-10-CM | POA: Diagnosis present

## 2021-02-04 DIAGNOSIS — D539 Nutritional anemia, unspecified: Secondary | ICD-10-CM

## 2021-02-04 DIAGNOSIS — Z79899 Other long term (current) drug therapy: Secondary | ICD-10-CM | POA: Diagnosis not present

## 2021-02-04 DIAGNOSIS — I5022 Chronic systolic (congestive) heart failure: Secondary | ICD-10-CM | POA: Diagnosis present

## 2021-02-04 DIAGNOSIS — J9611 Chronic respiratory failure with hypoxia: Secondary | ICD-10-CM | POA: Diagnosis present

## 2021-02-04 DIAGNOSIS — M898X9 Other specified disorders of bone, unspecified site: Secondary | ICD-10-CM | POA: Diagnosis present

## 2021-02-04 DIAGNOSIS — I132 Hypertensive heart and chronic kidney disease with heart failure and with stage 5 chronic kidney disease, or end stage renal disease: Secondary | ICD-10-CM | POA: Diagnosis present

## 2021-02-04 DIAGNOSIS — L03116 Cellulitis of left lower limb: Secondary | ICD-10-CM | POA: Diagnosis not present

## 2021-02-04 LAB — CBC WITH DIFFERENTIAL/PLATELET
Abs Immature Granulocytes: 0.04 10*3/uL (ref 0.00–0.07)
Basophils Absolute: 0.1 10*3/uL (ref 0.0–0.1)
Basophils Relative: 1 %
Eosinophils Absolute: 0.4 10*3/uL (ref 0.0–0.5)
Eosinophils Relative: 5 %
HCT: 36.8 % — ABNORMAL LOW (ref 39.0–52.0)
Hemoglobin: 11.8 g/dL — ABNORMAL LOW (ref 13.0–17.0)
Immature Granulocytes: 1 %
Lymphocytes Relative: 13 %
Lymphs Abs: 1.1 10*3/uL (ref 0.7–4.0)
MCH: 34.2 pg — ABNORMAL HIGH (ref 26.0–34.0)
MCHC: 32.1 g/dL (ref 30.0–36.0)
MCV: 106.7 fL — ABNORMAL HIGH (ref 80.0–100.0)
Monocytes Absolute: 0.4 10*3/uL (ref 0.1–1.0)
Monocytes Relative: 5 %
Neutro Abs: 6.1 10*3/uL (ref 1.7–7.7)
Neutrophils Relative %: 75 %
Platelets: 215 10*3/uL (ref 150–400)
RBC: 3.45 MIL/uL — ABNORMAL LOW (ref 4.22–5.81)
RDW: 18.5 % — ABNORMAL HIGH (ref 11.5–15.5)
WBC: 8.1 10*3/uL (ref 4.0–10.5)
nRBC: 0.7 % — ABNORMAL HIGH (ref 0.0–0.2)

## 2021-02-04 LAB — COMPREHENSIVE METABOLIC PANEL
ALT: 7 U/L (ref 0–44)
AST: 16 U/L (ref 15–41)
Albumin: 3.7 g/dL (ref 3.5–5.0)
Alkaline Phosphatase: 83 U/L (ref 38–126)
Anion gap: 15 (ref 5–15)
BUN: 39 mg/dL — ABNORMAL HIGH (ref 6–20)
CO2: 25 mmol/L (ref 22–32)
Calcium: 8.1 mg/dL — ABNORMAL LOW (ref 8.9–10.3)
Chloride: 97 mmol/L — ABNORMAL LOW (ref 98–111)
Creatinine, Ser: 6.59 mg/dL — ABNORMAL HIGH (ref 0.61–1.24)
GFR, Estimated: 10 mL/min — ABNORMAL LOW (ref >60)
Glucose, Bld: 79 mg/dL (ref 70–99)
Potassium: 4.1 mmol/L (ref 3.5–5.1)
Sodium: 137 mmol/L (ref 135–145)
Total Bilirubin: 0.7 mg/dL (ref 0.3–1.2)
Total Protein: 7.5 g/dL (ref 6.5–8.1)

## 2021-02-04 LAB — HEPATITIS B SURFACE ANTIBODY,QUALITATIVE: Hep B S Ab: REACTIVE — AB

## 2021-02-04 LAB — APTT: aPTT: 32 seconds (ref 24–36)

## 2021-02-04 LAB — RESP PANEL BY RT-PCR (FLU A&B, COVID) ARPGX2
Influenza A by PCR: NEGATIVE
Influenza B by PCR: NEGATIVE
SARS Coronavirus 2 by RT PCR: NEGATIVE

## 2021-02-04 LAB — HIV ANTIBODY (ROUTINE TESTING W REFLEX): HIV Screen 4th Generation wRfx: NONREACTIVE

## 2021-02-04 LAB — GLUCOSE, CAPILLARY
Glucose-Capillary: 79 mg/dL (ref 70–99)
Glucose-Capillary: 85 mg/dL (ref 70–99)

## 2021-02-04 LAB — MAGNESIUM: Magnesium: 2 mg/dL (ref 1.7–2.4)

## 2021-02-04 LAB — LACTIC ACID, PLASMA
Lactic Acid, Venous: 1.3 mmol/L (ref 0.5–1.9)
Lactic Acid, Venous: 1.3 mmol/L (ref 0.5–1.9)

## 2021-02-04 LAB — CBG MONITORING, ED: Glucose-Capillary: 83 mg/dL (ref 70–99)

## 2021-02-04 LAB — VITAMIN B12: Vitamin B-12: 757 pg/mL (ref 180–914)

## 2021-02-04 LAB — HEPATITIS B SURFACE ANTIGEN: Hepatitis B Surface Ag: NONREACTIVE

## 2021-02-04 LAB — PHOSPHORUS: Phosphorus: 6.2 mg/dL — ABNORMAL HIGH (ref 2.5–4.6)

## 2021-02-04 LAB — FOLATE: Folate: 10.3 ng/mL (ref >5.9)

## 2021-02-04 LAB — PROTIME-INR
INR: 1.1 (ref 0.8–1.2)
Prothrombin Time: 14.3 seconds (ref 11.4–15.2)

## 2021-02-04 IMAGING — DX DG FOOT COMPLETE 3+V*L*
3 series · 3 of 3 positions shown · non-contrast
Comparison: [DATE]

CLINICAL DATA: Left foot pain x3 weeks with chills and rash.

EXAM:
LEFT FOOT - COMPLETE 3+ VIEW

[foot obl]
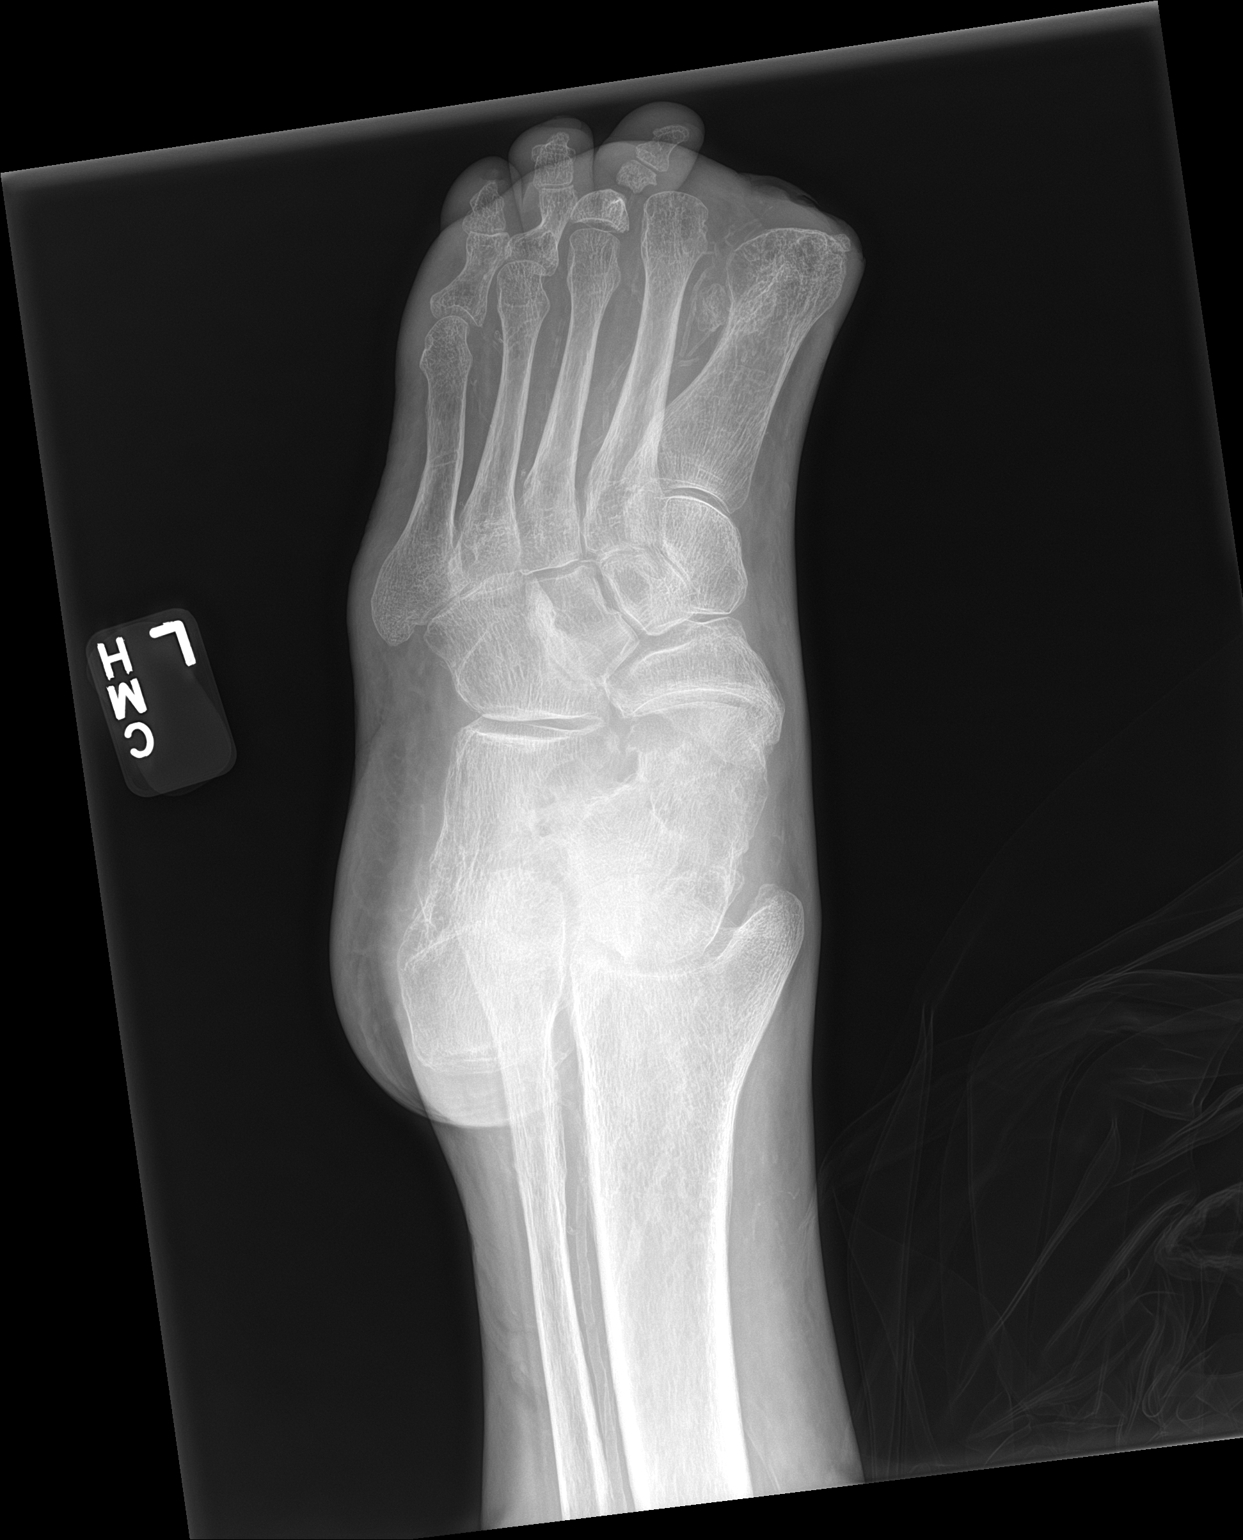

[foot ap]
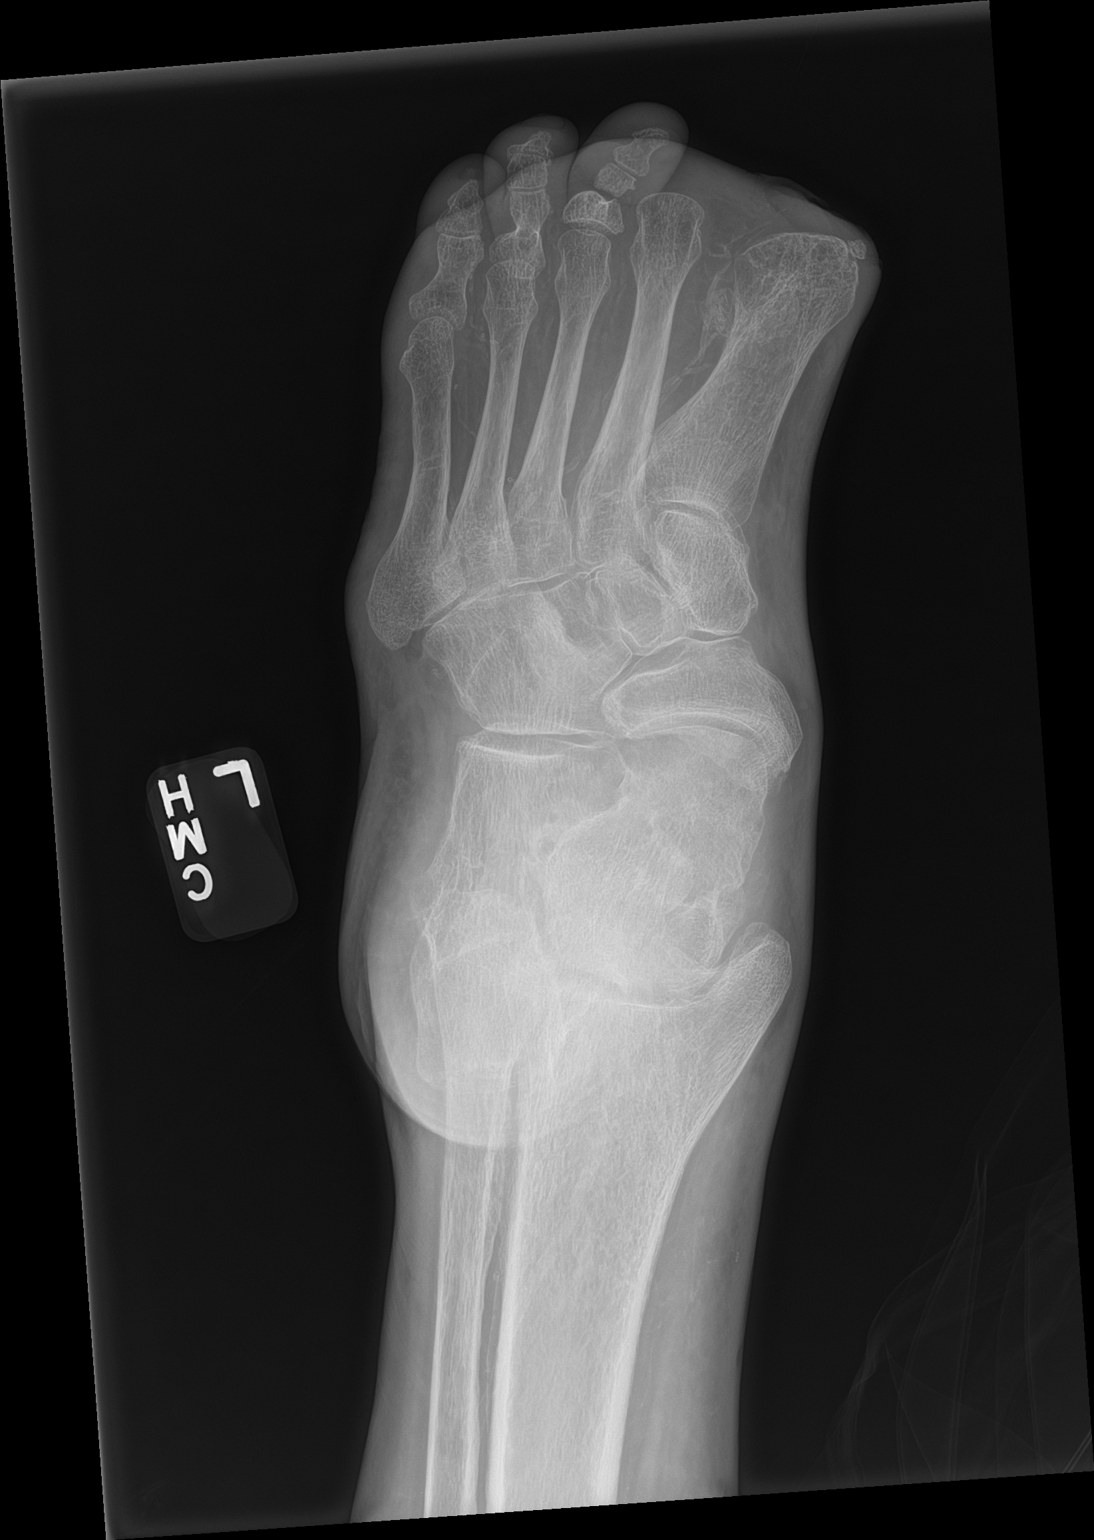

[foot lat]
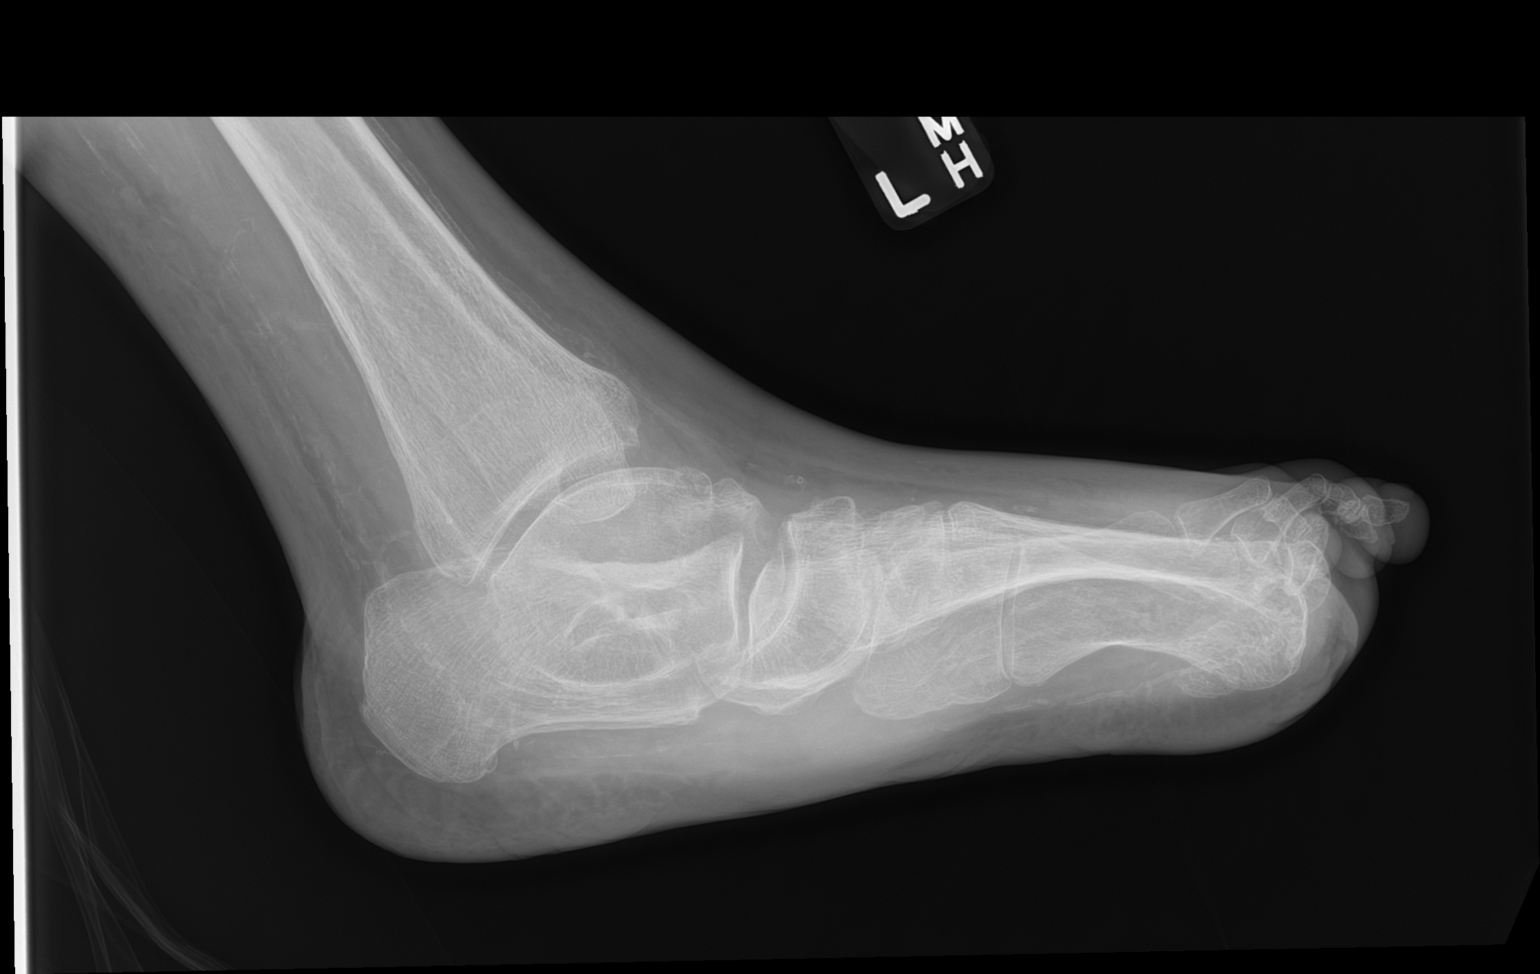

[3 of 3 positions shown; findings below may reference images not displayed]

FINDINGS: Surgical amputation of the first and second left toes is noted with
stable chronic and postoperative changes seen involving the third
left toe. There is no evidence of an fracture or dislocation. No
areas of cortical erosion or acute periosteal reaction are seen.
Mild degenerative changes are noted along the dorsal aspect of the
mid left foot. Loss of the plantar arch is noted. There is mild
diffuse soft tissue swelling which is more prominent along the
plantar aspect of the mid left foot.
IMPRESSION: 1. Stable chronic and postoperative changes, without evidence of
acute osseous abnormality.
2. Plantar soft tissue swelling, as described above, without
evidence of acute osteomyelitis.

## 2021-02-04 MED ORDER — CALAMINE EX LOTN
1.0000 "application " | TOPICAL_LOTION | Freq: Three times a day (TID) | CUTANEOUS | Status: DC | PRN
  Administered 2021-02-04: 1 via TOPICAL
  Filled 2021-02-04: qty 177

## 2021-02-04 MED ORDER — INSULIN ASPART 100 UNIT/ML IJ SOLN
0.0000 [IU] | Freq: Three times a day (TID) | INTRAMUSCULAR | Status: DC
  Administered 2021-02-05: 17:00:00 1 [IU] via SUBCUTANEOUS

## 2021-02-04 MED ORDER — ACETAMINOPHEN 325 MG PO TABS
650.0000 mg | ORAL_TABLET | Freq: Four times a day (QID) | ORAL | Status: DC | PRN
  Filled 2021-02-04: qty 2

## 2021-02-04 MED ORDER — CALCIUM CARBONATE ANTACID 500 MG PO CHEW
800.0000 mg | CHEWABLE_TABLET | Freq: Two times a day (BID) | ORAL | Status: DC
  Administered 2021-02-04 – 2021-02-06 (×5): 800 mg via ORAL
  Filled 2021-02-04 (×5): qty 4

## 2021-02-04 MED ORDER — CHLORHEXIDINE GLUCONATE CLOTH 2 % EX PADS: 6.0000 | MEDICATED_PAD | Freq: Every day | CUTANEOUS | Status: DC

## 2021-02-04 MED ORDER — SODIUM CHLORIDE 0.9 % IV SOLN
2.0000 g | Freq: Once | INTRAVENOUS | Status: AC
  Administered 2021-02-04: 03:00:00 2 g via INTRAVENOUS
  Filled 2021-02-04: qty 2

## 2021-02-04 MED ORDER — OXYCODONE HCL 5 MG PO TABS
5.0000 mg | ORAL_TABLET | ORAL | Status: DC | PRN
  Administered 2021-02-04 – 2021-02-06 (×8): 5 mg via ORAL
  Filled 2021-02-04 (×9): qty 1

## 2021-02-04 MED ORDER — VITAMIN D 25 MCG (1000 UNIT) PO TABS
1000.0000 [IU] | ORAL_TABLET | ORAL | Status: DC
  Administered 2021-02-05: 12:00:00 1000 [IU] via ORAL
  Filled 2021-02-04: qty 1

## 2021-02-04 MED ORDER — CARVEDILOL 3.125 MG PO TABS
3.1250 mg | ORAL_TABLET | Freq: Two times a day (BID) | ORAL | Status: DC
  Administered 2021-02-04 – 2021-02-06 (×4): 3.125 mg via ORAL
  Filled 2021-02-04 (×5): qty 1

## 2021-02-04 MED ORDER — INSULIN ASPART 100 UNIT/ML IJ SOLN: 0.0000 [IU] | Freq: Every day | INTRAMUSCULAR | Status: DC

## 2021-02-04 MED ORDER — PANTOPRAZOLE SODIUM 20 MG PO TBEC
20.0000 mg | DELAYED_RELEASE_TABLET | Freq: Every day | ORAL | Status: DC
  Filled 2021-02-04 (×2): qty 1

## 2021-02-04 MED ORDER — CALCIUM ACETATE (PHOS BINDER) 667 MG PO CAPS
667.0000 mg | ORAL_CAPSULE | Freq: Three times a day (TID) | ORAL | Status: DC
  Administered 2021-02-04 – 2021-02-06 (×6): 667 mg via ORAL
  Filled 2021-02-04 (×7): qty 1

## 2021-02-04 MED ORDER — VITAMIN D3 25 MCG (1000 UT) PO CAPS: 1.0000 | ORAL_CAPSULE | ORAL | Status: DC

## 2021-02-04 MED ORDER — DIPHENHYDRAMINE HCL 25 MG PO CAPS
25.0000 mg | ORAL_CAPSULE | Freq: Once | ORAL | Status: AC
  Administered 2021-02-04: 21:00:00 25 mg via ORAL
  Filled 2021-02-04: qty 1

## 2021-02-04 MED ORDER — ENOXAPARIN SODIUM 30 MG/0.3ML IJ SOSY
30.0000 mg | PREFILLED_SYRINGE | INTRAMUSCULAR | Status: DC
  Administered 2021-02-04: 05:00:00 30 mg via SUBCUTANEOUS
  Filled 2021-02-04: qty 0.3

## 2021-02-04 MED ORDER — FENTANYL CITRATE PF 50 MCG/ML IJ SOSY
50.0000 ug | PREFILLED_SYRINGE | Freq: Once | INTRAMUSCULAR | Status: AC
  Administered 2021-02-04: 01:00:00 50 ug via INTRAVENOUS
  Filled 2021-02-04: qty 1

## 2021-02-04 MED ORDER — PANTOPRAZOLE SODIUM 40 MG PO TBEC
40.0000 mg | DELAYED_RELEASE_TABLET | Freq: Every day | ORAL | Status: DC
  Administered 2021-02-04 – 2021-02-06 (×2): 40 mg via ORAL
  Filled 2021-02-04 (×3): qty 1

## 2021-02-04 MED ORDER — ATORVASTATIN CALCIUM 10 MG PO TABS
10.0000 mg | ORAL_TABLET | Freq: Every evening | ORAL | Status: DC
  Administered 2021-02-04 – 2021-02-05 (×2): 10 mg via ORAL
  Filled 2021-02-04 (×2): qty 1

## 2021-02-04 MED ORDER — APIXABAN 5 MG PO TABS
5.0000 mg | ORAL_TABLET | Freq: Two times a day (BID) | ORAL | Status: DC
  Administered 2021-02-04 – 2021-02-06 (×4): 5 mg via ORAL
  Filled 2021-02-04 (×5): qty 1

## 2021-02-04 MED ORDER — INSULIN ASPART 100 UNIT/ML IJ SOLN: 0.0000 [IU] | Freq: Three times a day (TID) | INTRAMUSCULAR | Status: DC

## 2021-02-04 MED ORDER — PATIROMER SORBITEX CALCIUM 8.4 G PO PACK: 8.4000 g | PACK | ORAL | Status: DC

## 2021-02-04 MED ORDER — VANCOMYCIN HCL IN DEXTROSE 1-5 GM/200ML-% IV SOLN
1000.0000 mg | Freq: Once | INTRAVENOUS | Status: AC
  Administered 2021-02-04: 04:00:00 1000 mg via INTRAVENOUS
  Filled 2021-02-04: qty 200

## 2021-02-04 MED ORDER — SODIUM CHLORIDE 0.9 % IV SOLN
2.0000 g | INTRAVENOUS | Status: DC
  Administered 2021-02-04 – 2021-02-05 (×2): 2 g via INTRAVENOUS
  Filled 2021-02-04 (×2): qty 20

## 2021-02-04 NOTE — ED Notes (Signed)
Right AKA, Prothesis at bedside.  Small sore/scabs noted on legs, feet, arms and trunk.  Amputee to left great toe.

## 2021-02-04 NOTE — ED Provider Notes (Signed)
[facility] EMERGENCY DEPARTMENT Provider Note   CSN: [id number] Arrival date & time: 02/03/21  1905     History Chief Complaint  Patient presents with   Foot Pain    Brendan Campbell is a 50 y.o. male.  The history is provided by the patient.  Foot Pain This is a chronic problem. The current episode started more than 2 days ago. The problem occurs daily. The problem has been gradually worsening. Associated symptoms include headaches. Pertinent negatives include no chest pain, no abdominal pain and no shortness of breath. Exacerbated by: movement. Nothing relieves the symptoms.  Patient with extensive history including CHF, end-stage renal disease presents with left foot pain.  Patient reports he has had this pain for several weeks.  It has been worsening.  He reports he can barely get around in his house due to severe pain.  He reports he missed his most recent dialysis session.  He also reports associated nausea and vomiting due to pain.  No fevers.  No active chest pain or shortness of breath at this time.  Patient also reports a rash to his back for several weeks    Past Medical History:  Diagnosis Date   Anemia    Anxiety    Blood transfusion without reported diagnosis    CHF (congestive heart failure) ([place])    a. EF 25% by echo in 07/2019   Chronic kidney disease    Depression    Diabetes mellitus without complication ([place])    End stage renal disease ([place])    M/W/F [facility]   Hyperlipidemia    Neuropathy    Peripheral vascular disease ([place])    PTSD (post-traumatic stress disorder)     Patient Active Problem List   Diagnosis Date Noted   Lower extremity cellulitis 29/79/8921   Chronic systolic CHF (congestive heart failure) ([place]) 07/27/2020   Volume overload 07/20/2020   COVID-19 virus infection 07/20/2020   Acute on chronic respiratory failure with hypoxia ([place]) 07/03/2020   Acute on chronic combined systolic and diastolic CHF (congestive heart failure) ([place])  04/08/2020   Left hemiparesis ([place]) 04/08/2020   Lumbar spondylosis 04/08/2020   Atrial fibrillation with RVR ([place]) 04/08/2020   Elevated troponin 04/08/2020   Pain in limb 12/29/2019   Headache disorder 12/29/2019   Pain in left arm 12/29/2019   Cervical radiculopathy 12/29/2019   Acute congestive heart failure ([place]) 12/13/2019   Macrocytic anemia 09/25/2019   Paresthesia 09/24/2019   Hypocalcemia    Chronic atrial fibrillation ([place])    Hyperparathyroidism ([place])    Cardiac arrest ([place]) 06/07/2019   Neuropathy    Peripheral vascular disease (HCC)    Prolonged QT interval    Leukocytosis    Atypical pneumonia    Acute pulmonary edema (HCC)    ESRD on hemodialysis ([place])    Hyperkalemia 10/05/2018   Weakness 10/05/2018   ESRD (end stage renal disease) ([place]) 05/10/2016   Spondylosis of lumbar spine 03/08/2016   Lumbar radiculopathy 03/08/2016   S/P below knee amputation, 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   Type 2 diabetes mellitus with diabetic chronic kidney disease ([place]) 03/03/2015   Hyperlipidemia 03/03/2015   Low back pain 03/03/2015    Past Surgical History:  Procedure Laterality Date   A/V FISTULAGRAM N/A 10/09/2018   Procedure: A/V FISTULAGRAM;  Surgeon: Serafina Mitchell, MD;  Location: [place] [facility];  Service: Cardiovascular;  Laterality: N/A;   AV FISTULA PLACEMENT Left 09/22/2016   Procedure: CREATION OF LEFT ARM ARTERIOVENOUS (AV)  FISTULA;  Surgeon: Waynetta Sandy, MD;  Location: [place];  Service: Vascular;  Laterality: Left;   AV FISTULA PLACEMENT Left 10/31/2017   Procedure: INSERTION OF ARTERIOVENOUS (AV) GORE-TEX 4-61mm STETCH GRAFT LEFT ARM;  Surgeon: Waynetta Sandy, MD;  Location: [place];  Service: Vascular;  Laterality: Left;   [place] Left 08/17/2017   Procedure: SECOND STAGE BASILIC VEIN TRANSPOSITION LEFT ARM;  Surgeon: Waynetta Sandy, MD;  Location: [place];  Service: Vascular;  Laterality: Left;   BELOW KNEE LEG  AMPUTATION Right    CARDIOVERSION N/A 02/19/2019   Procedure: CARDIOVERSION;  Surgeon: [name], MD;  Location: [facility] ENDOSCOPY;  Service: Cardiovascular;  Laterality: N/A;   CATARACT EXTRACTION W/PHACO Right 10/23/2020   Procedure: CATARACT EXTRACTION PHACO AND INTRAOCULAR LENS PLACEMENT ([place]);  Surgeon: Baruch Goldmann, MD;  Location: [facility];  Service: Ophthalmology;  Laterality: Right;  CDE 39.03   CHOLECYSTECTOMY     FOOT SURGERY     IR FLUORO GUIDE CV LINE RIGHT  05/16/2016   IR FLUORO GUIDE CV LINE RIGHT  02/16/2019   IR REMOVAL TUN CV CATH W/O FL  05/16/2016   IR REMOVAL TUN CV CATH W/O FL  02/19/2019   IR THROMBECTOMY AV FISTULA W/THROMBOLYSIS/PTA INC/SHUNT/IMG LEFT Left 11/09/2018   IR THROMBECTOMY AV FISTULA W/THROMBOLYSIS/PTA INC/SHUNT/IMG LEFT Left 06/22/2019   IR US GUIDE VASC ACCESS LEFT  11/09/2018   IR US GUIDE VASC ACCESS LEFT  06/22/2019   IR US GUIDE VASC ACCESS RIGHT  05/16/2016   IR US GUIDE VASC ACCESS RIGHT  02/16/2019   PERIPHERAL VASCULAR BALLOON ANGIOPLASTY  10/09/2018   Procedure: PERIPHERAL VASCULAR BALLOON ANGIOPLASTY;  Surgeon: Serafina Mitchell, MD;  Location: [place] [facility];  Service: Cardiovascular;;   TEE WITHOUT CARDIOVERSION N/A 02/19/2019   Procedure: TRANSESOPHAGEAL ECHOCARDIOGRAM (TEE);  Surgeon: [name], MD;  Location: [facility] ENDOSCOPY;  Service: Cardiovascular;  Laterality: N/A;       Family History  Problem Relation Age of Onset   Cancer Mother        lung   Diabetes Mother    Heart attack Father    Diabetes Father    Diabetes Sister     Social History   Tobacco Use   Smoking status: Some Days    Types: Cigarettes    Last attempt to quit: 09/03/2006    Years since quitting: 14.4   Smokeless tobacco: Never  Vaping Use   Vaping Use: Never used  Substance Use Topics   Alcohol use: No   Drug use: No    Home Medications Prior to Admission medications   Medication Sig Start Date End Date Taking? Authorizing Provider  albuterol  (PROVENTIL) (2.5 MG/3ML) 0.083% nebulizer solution Take 2.5 mg by nebulization every 6 (six) hours as needed for wheezing or shortness of breath. Patient not taking: Reported on 12/07/2020    [provider]  albuterol (VENTOLIN HFA) 108 (90 Base) MCG/ACT inhaler Inhale 2 puffs into the lungs every 4 (four) hours as needed for wheezing or shortness of breath. 07/21/20   Manuella Ghazi, Pratik D, DO  apixaban (ELIQUIS) 5 MG TABS tablet Take 1 tablet (5 mg total) by mouth 2 (two) times daily. 08/23/19   Strader, Fransisco Hertz, PA-C  aspirin 81 MG chewable tablet Chew 81 mg by mouth daily.    [provider]  atorvastatin (LIPITOR) 10 MG tablet Take 1 tablet (10 mg total) by mouth every evening. Patient taking differently: Take 10 mg by mouth  every evening. Take 1 tablet on Monday, Wednesday and Friday. 08/03/19   Barton Dubois, MD  butalbital-acetaminophen-caffeine (FIORICET) [phone] MG tablet Take 1 tablet by mouth every 6 (six) hours as needed for headache or migraine. 07/21/20   Manuella Ghazi, Pratik D, DO  calcium acetate (PHOSLO) 667 MG capsule Take 1 capsule (667 mg total) by mouth 3 (three) times daily with meals. 02/21/19   Donnamae Jude, MD  calcium carbonate (TUMS - DOSED IN MG ELEMENTAL CALCIUM) 500 MG chewable tablet Chew 4 tablets (800 mg of elemental calcium total) by mouth 2 (two) times daily. 08/03/19   Barton Dubois, MD  carvedilol (COREG) 3.125 MG tablet Take 1 tablet (3.125 mg total) by mouth 2 (two) times daily with a meal. Patient not taking: Reported on 12/07/2020 04/09/20 11/26/20  Deatra Lucas, MD  Cholecalciferol (VITAMIN D3) 25 MCG (1000 UT) CAPS Take 1 capsule by mouth See admin instructions. Take before Dialysis on Monday Wednesday and Fridays    [provider]  doxycycline (VIBRAMYCIN) 100 MG capsule One po bid Patient not taking: No sig reported 11/26/20   Milton Ferguson, MD  DULoxetine (CYMBALTA) 30 MG capsule Take 30 mg by mouth 2 (two) times daily. Patient not  taking: Reported on 12/07/2020 12/26/19   [provider]  guaiFENesin-dextromethorphan (ROBITUSSIN DM) 100-10 MG/5ML syrup Take 5 mLs by mouth every 6 (six) hours as needed for cough. Patient not taking: Reported on 12/07/2020 07/28/20   Barton Dubois, MD  HYDROcodone-acetaminophen (NORCO/VICODIN) 5-325 MG tablet Take 1 tablet by mouth every 6 (six) hours as needed for moderate pain. Patient not taking: No sig reported 11/26/20   Milton Ferguson, MD  LANTUS SOLOSTAR 100 UNIT/ML Solostar Pen Inject 3 Units into the skin at bedtime. Patient not taking: Reported on 12/07/2020 01/18/19   [provider]  lidocaine-prilocaine (EMLA) cream Apply 1 application topically every Monday, Wednesday, and Friday with hemodialysis. Patient not taking: Reported on 12/07/2020 05/06/19   [provider]  multivitamin (RENA-VIT) TABS tablet Take 1 tablet by mouth daily. Patient not taking: Reported on 12/07/2020 08/04/18   [provider]  pantoprazole (PROTONIX) 20 MG tablet Take 1 tablet (20 mg total) by mouth daily. 11/26/20   Milton Ferguson, MD  patiromer (VELTASSA) 8.4 g packet Take 1 packet (8.4 g total) by mouth 3 (three) times a week. On Tuesday, Thursday and Saturday 06/10/19   Kathie Dike, MD    Allergies    Tape  Review of Systems   Review of Systems  Constitutional:  Negative for fever.  Respiratory:  Negative for shortness of breath.   Cardiovascular:  Negative for chest pain.  Gastrointestinal:  Positive for nausea and vomiting. Negative for abdominal pain.  Musculoskeletal:  Positive for arthralgias.  Skin:  Positive for color change, rash and wound.  Neurological:  Positive for headaches.  All other systems reviewed and are negative.  Physical Exam Updated Vital Signs BP 108/82    Pulse 97    Temp (!) 97.5 F (36.4 C) (Oral)    Resp 16    Ht 1.753 m (5\' 9" )    Wt 102.1 kg    SpO2 97%    BMI 33.23 kg/m   Physical Exam CONSTITUTIONAL: Appears much  older than stated age HEAD: Normocephalic/atraumatic EYES: EOMI/PERRL ENMT: Mucous membranes moist NECK: supple no meningeal signs SPINE/BACK:entire spine nontender CV: S1/S2 noted, no murmurs/rubs/gallops noted LUNGS: Lungs are clear to auscultation bilaterally, no apparent distress ABDOMEN: soft, nontender GU:no cva tenderness NEURO: Pt is  awake/alert/appropriate, moves all extremitiesx4.   EXTREMITIES: Left foot is warm with significant erythema.  No crepitus.  See photo below Previous right BKA is noted.  Dialysis access to left arm with thrill noted Pulses noted by Doppler. SKIN: warm, rash noted throughout his back, see photo PSYCH: no abnormalities of mood noted, alert and oriented to situation       ED Results / Procedures / Treatments   Labs (all labs ordered are listed, but only abnormal results are displayed) Labs Reviewed  COMPREHENSIVE METABOLIC PANEL - Abnormal; Notable for the following components:      Result Value   Chloride 97 (*)    BUN 39 (*)    Creatinine, Ser 6.59 (*)    Calcium 8.1 (*)    GFR, Estimated 10 (*)    All other components within normal limits  CBC WITH DIFFERENTIAL/PLATELET - Abnormal; Notable for the following components:   RBC 3.45 (*)    Hemoglobin 11.8 (*)    HCT 36.8 (*)    MCV 106.7 (*)    MCH 34.2 (*)    RDW 18.5 (*)    nRBC 0.7 (*)    All other components within normal limits  CULTURE, BLOOD (ROUTINE X 2)  CULTURE, BLOOD (ROUTINE X 2)  RESP PANEL BY RT-PCR (FLU A&B, COVID) [id number]  LACTIC ACID, PLASMA  LACTIC ACID, PLASMA  PROTIME-INR  APTT    EKG ED ECG REPORT   Date: 02/04/2021 0118  Rate: 94  Rhythm: normal sinus rhythm  QRS Axis: left  Intervals: QT prolonged  ST/T Wave abnormalities: ST elevations anteriorly  Conduction Disutrbances:none  Narrative Interpretation:   Old EKG Reviewed: unchanged  I have personally reviewed the EKG tracing and agree with the computerized printout as noted.   Radiology DG  Chest Port 1 View  Result Date: 02/04/2021 CLINICAL DATA:  Left foot pain x3 weeks with chills and rash. EXAM: PORTABLE CHEST 1 VIEW COMPARISON:  November 26, 2020 FINDINGS: Mild, diffuse, chronic appearing increased interstitial lung markings are seen. This is unchanged in severity when compared to the prior study. There is no evidence of acute infiltrate, pleural effusion or pneumothorax. The cardiac silhouette is moderately enlarged and unchanged in size. The visualized skeletal structures are unremarkable. IMPRESSION: Stable cardiomegaly without evidence of acute or active cardiopulmonary disease. Electronically Signed   By: Virgina Norfolk M.D.   On: 02/04/2021 01:24   DG Foot Complete Left  Result Date: 02/04/2021 CLINICAL DATA:  Left foot pain x3 weeks with chills and rash. EXAM: LEFT FOOT - COMPLETE 3+ VIEW COMPARISON:  December 07, 2020 FINDINGS: Surgical amputation of the first and second left toes is noted with stable chronic and postoperative changes seen involving the third left toe. There is no evidence of an fracture or dislocation. No areas of cortical erosion or acute periosteal reaction are seen. Mild degenerative changes are noted along the dorsal aspect of the mid left foot. Loss of the plantar arch is noted. There is mild diffuse soft tissue swelling which is more prominent along the plantar aspect of the mid left foot. IMPRESSION: 1. Stable chronic and postoperative changes, without evidence of acute osseous abnormality. 2. Plantar soft tissue swelling, as described above, without evidence of acute osteomyelitis. Electronically Signed   By: Virgina Norfolk M.D.   On: 02/04/2021 01:30    Procedures Procedures   Medications Ordered in ED Medications  vancomycin (VANCOCIN) IVPB 1000 mg/200 mL premix (has no administration in time range)  fentaNYL (SUBLIMAZE) injection  50 mcg (50 mcg Intravenous Given 02/04/21 0115)  ceFEPIme (MAXIPIME) 2 g in sodium chloride 0.9 % 100 mL IVPB (2  g Intravenous New Bag/Given 02/04/21 0255)    ED Course  I have reviewed the triage vital signs and the nursing notes.  Pertinent labs & imaging results that were available during my care of the patient were reviewed by me and considered in my medical decision making (see chart for details).    MDM Rules/Calculators/A&P                         This patient presents to the ED for concern of foot pain, this involves an extensive number of treatment options, and is a complaint that carries with it a high risk of complications and morbidity.  The differential diagnosis includes cellulitis, gangrene, fasciitis, osteomyelitis   Additional history obtained:  External records from outside source obtained and reviewed including previous hospital records reviewed   Lab Tests:  I Ordered, reviewed, and interpreted labs.  The pertinent results include: Negative lactic acid   Imaging Studies ordered:  I ordered imaging studies including chest x-ray and foot x-ray I independently visualized and interpreted imaging which showed no acute findings I agree with the radiologist interpretation   Cardiac Monitoring:  The patient was maintained on a cardiac monitor.  I personally viewed and interpreted the cardiac monitored which showed an underlying rhythm of: Sinus rhythm   Medicines ordered and prescription drug management:  I ordered medication including vancomycin for cellulitis Reevaluation of the patient after these medicines showed that the patient improved I have reviewed the patients home medicines and have made adjustments as needed   Critical Interventions:  Started IV antibiotics   Consultations Obtained:  I requested consultation with the hospitalist Dr. Josephine Cables,  and discussed lab and imaging findings as well as pertinent plan - they recommend: We will admit to the hospital   Problem List / ED Course:     Reevaluation:  After the interventions noted above, I  reevaluated the patient and found that they have :improved   Dispostion:  After consideration of the diagnostic results and the patients response to treatment feel that the patent would benefit from admission to the hospital.   Patient with extensive medical history presents with pain in his left leg and foot.  Patient has cellulitis to the left foot.  He has scattered lesions throughout the leg but no other obvious abscess in the upper leg or buttock. Patient could also have potential osteomyelitis in his left foot.  He would benefit from admission.  Patient also has rash of unclear etiology.  He has had this for several weeks.  He reports it is mildly pruritic. This can be deferred to the inpatient team   Final Clinical Impression(s) / ED Diagnoses Final diagnoses:  Cellulitis of left foot  ESRD (end stage renal disease) [facility])    Rx / DC Orders ED Discharge Orders     None        Ripley Fraise, MD 02/04/21 [id number]

## 2021-02-04 NOTE — ED Notes (Signed)
Pulse located in L foot with doppler

## 2021-02-04 NOTE — H&P (Signed)
History and Physical  Brendan Campbell NFA:[id number] DOB: 1970/12/27 DOA: 02/03/2021  Referring physician: Ripley Fraise, MD PCP: Monico Blitz, MD  Patient coming from: Home  Chief Complaint: Left foot pain  HPI: Brendan Campbell is a 50 y.o. male with medical history significant for ESRD on HD (MWF), anemia, anxiety, depression, PTSD, chronic systolic CHF on 2 LPM of oxygen at baseline, type II DM, hyperlipidemia, diabetic peripheral neuropathy, PVD who presents to the emergency department due to 2 to 3-week onset of progressive and worsening left foot pain which worsened about 2 days ago.  He complained of increasing redness and swelling of the left foot and also complaining of swelling of the left leg with some weeping in the leg.  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  He also comes some once in the lower extremity which started about the same time.  Patient also complained of diffuse rash in his back which also started shortly after being discharged from [facility] in October, 2022 per medical record (care everywhere).  He denies fever, chills, chest pain, shortness of breath, nausea, vomiting, abdominal pain.  ED Course:  In the emergency department, he was hemodynamically stable.  Work-up in the ED showed macrocytic anemia, BUN/creatinine 39/6.59, eGFR 10 (last dialysis was on Monday 12/26, phosphorus 6.2, lactic acid was negative.  Influenza A, B, SARS coronavirus 2 was negative. Left foot x-ray showed Stable chronic and postoperative changes, without evidence of acute osseous abnormality. 2. Plantar soft tissue swelling, as described above, without evidence of acute osteomyelitis. Patient was treated with Vanco and cefepime, fentanyl was given.  Hospitalist was asked to admit patient for further evaluation and management.  Review of  Systems: A full 10 point Review of Systems was done, except as stated above, all other Review of systems were negative.   Past Medical History:  Diagnosis Date   Anemia    Anxiety    Blood transfusion without reported diagnosis    CHF (congestive heart failure) ([place])    a. EF 25% by echo in 07/2019   Chronic kidney disease    Depression    Diabetes mellitus without complication ([place])    End stage renal disease ([place])    M/W/F [facility]   Hyperlipidemia    Neuropathy    Peripheral vascular disease ([facility])    PTSD (post-traumatic stress disorder)    Past Surgical History:  Procedure Laterality Date   A/V FISTULAGRAM N/A 10/09/2018   Procedure: A/V FISTULAGRAM;  Surgeon: Serafina Mitchell, MD;  Location: [place] [facility];  Service: Cardiovascular;  Laterality: N/A;   AV FISTULA PLACEMENT Left 09/22/2016   Procedure: CREATION OF LEFT ARM ARTERIOVENOUS (AV) FISTULA;  Surgeon: Waynetta Sandy, MD;  Location: [place];  Service: Vascular;  Laterality: Left;   AV FISTULA PLACEMENT Left 10/31/2017   Procedure: INSERTION OF ARTERIOVENOUS (AV) GORE-TEX 4-26m STETCH GRAFT LEFT ARM;  Surgeon: CWaynetta Sandy MD;  [place] [facility]  Service: Vascular;  Laterality: Left;   [place] 08/17/2017   Procedure: SECOND STAGE BASILIC VEIN TRANSPOSITION LEFT ARM;  Surgeon: CWaynetta Sandy MD;  [place] [facility]  Service: Vascular;  Laterality: Left;   BELOW KNEE LEG AMPUTATION Right    CARDIOVERSION N/A 02/19/2019   Procedure: CARDIOVERSION;  Surgeon: HPixie Casino  MD;  Location: [place];  Service: Cardiovascular;  Laterality: N/A;   CATARACT EXTRACTION W/PHACO Right 10/23/2020   Procedure: CATARACT EXTRACTION PHACO AND INTRAOCULAR LENS PLACEMENT (IOC);  Surgeon: Baruch Goldmann, MD;  [facility]: [facility];  Service: Ophthalmology;  Laterality: Right;  CDE 39.03   CHOLECYSTECTOMY     FOOT SURGERY     IR FLUORO GUIDE CV LINE RIGHT  05/16/2016   IR FLUORO  GUIDE CV LINE RIGHT  02/16/2019   IR REMOVAL TUN CV CATH W/O FL  05/16/2016   IR REMOVAL TUN CV CATH W/O FL  02/19/2019   IR THROMBECTOMY AV FISTULA W/THROMBOLYSIS/PTA INC/SHUNT/IMG LEFT Left 11/09/2018   IR THROMBECTOMY AV FISTULA W/THROMBOLYSIS/PTA INC/SHUNT/IMG LEFT Left 06/22/2019   IR US GUIDE VASC ACCESS LEFT  11/09/2018   IR US GUIDE VASC ACCESS LEFT  06/22/2019   IR US GUIDE VASC ACCESS RIGHT  05/16/2016   IR US GUIDE VASC ACCESS RIGHT  02/16/2019   PERIPHERAL VASCULAR BALLOON ANGIOPLASTY  10/09/2018   Procedure: PERIPHERAL VASCULAR BALLOON ANGIOPLASTY;  Surgeon: Serafina Mitchell, MD;  Location: [place] [facility];  Service: Cardiovascular;;   TEE WITHOUT CARDIOVERSION N/A 02/19/2019   Procedure: TRANSESOPHAGEAL ECHOCARDIOGRAM (TEE);  Surgeon: [name], MD;  [facility];  Service: Cardiovascular;  Laterality: N/A;    Social History:  reports that he has been smoking. He has never used smokeless tobacco. He reports that he does not drink alcohol and does not use drugs.   Allergies  Allergen Reactions   Tape Other (See Comments)    Pulls skin off    Family History  Problem Relation Age of Onset   Cancer Mother        lung   Diabetes Mother    Heart attack Father    Diabetes Father    Diabetes Sister      Prior to Admission medications   Medication Sig Start Date End Date Taking? Authorizing Provider  albuterol (PROVENTIL) (2.5 MG/3ML) 0.083% nebulizer solution Take 2.5 mg by nebulization every 6 (six) hours as needed for wheezing or shortness of breath. Patient not taking: Reported on 12/07/2020    [provider]  albuterol (VENTOLIN HFA) 108 (90 Base) MCG/ACT inhaler Inhale 2 puffs into the lungs every 4 (four) hours as needed for wheezing or shortness of breath. 07/21/20   Manuella Ghazi, Pratik D, DO  apixaban (ELIQUIS) 5 MG TABS tablet Take 1 tablet (5 mg total) by mouth 2 (two) times daily. 08/23/19   Strader, Fransisco Hertz, PA-C  aspirin 81 MG chewable tablet Chew 81  mg by mouth daily.    [provider]  atorvastatin (LIPITOR) 10 MG tablet Take 1 tablet (10 mg total) by mouth every evening. Patient taking differently: Take 10 mg by mouth every evening. Take 1 tablet on Monday, Wednesday and Friday. 08/03/19   Barton Dubois, MD  butalbital-acetaminophen-caffeine (FIORICET) [phone] MG tablet Take 1 tablet by mouth every 6 (six) hours as needed for headache or migraine. 07/21/20   Manuella Ghazi, Pratik D, DO  calcium acetate (PHOSLO) 667 MG capsule Take 1 capsule (667 mg total) by mouth 3 (three) times daily with meals. 02/21/19   Donnamae Jude, MD  calcium carbonate (TUMS - DOSED IN MG ELEMENTAL CALCIUM) 500 MG chewable tablet Chew 4 tablets (800 mg of elemental calcium total) by mouth 2 (two) times daily. 08/03/19   Barton Dubois, MD  carvedilol (COREG) 3.125 MG tablet Take 1 tablet (3.125 mg total) by mouth 2 (two) times daily with  a meal. Patient not taking: Reported on 12/07/2020 04/09/20 11/26/20  Deatra Mikhai, MD  Cholecalciferol (VITAMIN D3) 25 MCG (1000 UT) CAPS Take 1 capsule by mouth See admin instructions. Take before Dialysis on Monday Wednesday and Fridays    [provider]  doxycycline (VIBRAMYCIN) 100 MG capsule One po bid Patient not taking: No sig reported 11/26/20   Milton Ferguson, MD  DULoxetine (CYMBALTA) 30 MG capsule Take 30 mg by mouth 2 (two) times daily. Patient not taking: Reported on 12/07/2020 12/26/19   [provider]  guaiFENesin-dextromethorphan (ROBITUSSIN DM) 100-10 MG/5ML syrup Take 5 mLs by mouth every 6 (six) hours as needed for cough. Patient not taking: Reported on 12/07/2020 07/28/20   Barton Dubois, MD  HYDROcodone-acetaminophen (NORCO/VICODIN) 5-325 MG tablet Take 1 tablet by mouth every 6 (six) hours as needed for moderate pain. Patient not taking: No sig reported 11/26/20   Milton Ferguson, MD  LANTUS SOLOSTAR 100 UNIT/ML Solostar Pen Inject 3 Units into the skin at bedtime. Patient not taking:  Reported on 12/07/2020 01/18/19   [provider]  lidocaine-prilocaine (EMLA) cream Apply 1 application topically every Monday, Wednesday, and Friday with hemodialysis. Patient not taking: Reported on 12/07/2020 05/06/19   [provider]  multivitamin (RENA-VIT) TABS tablet Take 1 tablet by mouth daily. Patient not taking: Reported on 12/07/2020 08/04/18   [provider]  pantoprazole (PROTONIX) 20 MG tablet Take 1 tablet (20 mg total) by mouth daily. 11/26/20   Milton Ferguson, MD  patiromer (VELTASSA) 8.4 g packet Take 1 packet (8.4 g total) by mouth 3 (three) times a week. On Tuesday, Thursday and Saturday 06/10/19   Kathie Dike, MD    Physical Exam: BP 108/82    Pulse 97    Temp (!) 97.5 F (36.4 C) (Oral)    Resp 16    Ht _0  (1.753 m)    Wt 102.1 kg    SpO2 97%    BMI 33.23 kg/m   General: 50 y.o. year-old male well developed well nourished in no acute distress.  Alert and oriented x3. HEENT: NCAT, EOMI Neck: Supple, trachea medial Cardiovascular: Regular rate and rhythm with no rubs or gallops.  No thyromegaly or JVD noted.  No lower extremity edema. 2/4 pulses in all 4 extremities. Respiratory: Clear to auscultation with no wheezes or rales. Good inspiratory effort. Abdomen: Soft, nontender nondistended with normal bowel sounds x4 quadrants. Muskuloskeletal: Left foot with amputated great toe and second toe with surrounding erythema warm to touch.  Left toe wound with eschar Neuro: CN II-XII intact, strength 5/5 x 4, sensation, reflexes intact Skin: Patient presents with diffuse rash to the back.  Erythema and warmth to left foot Psychiatry: Mood is appropriate for condition and setting          Labs on Admission:  Basic Metabolic Panel: Recent Labs  Lab 02/04/21 0039  NA 137  K 4.1  CL 97*  CO2 25  GLUCOSE 79  BUN 39*  CREATININE 6.59*  CALCIUM 8.1*   Liver Function Tests: Recent Labs  Lab 02/04/21 0039  AST 16  ALT 7  ALKPHOS 83   BILITOT 0.7  PROT 7.5  ALBUMIN 3.7   No results for input(s): LIPASE, AMYLASE in the last 168 hours. No results for input(s): AMMONIA in the last 168 hours. CBC: Recent Labs  Lab 02/04/21 0039  WBC 8.1  NEUTROABS 6.1  HGB 11.8*  HCT 36.8*  MCV 106.7*  PLT 215   Cardiac Enzymes:  No results for input(s): CKTOTAL, CKMB, CKMBINDEX, TROPONINI in the last 168 hours.  BNP (last 3 results) Recent Labs    07/03/20 0618 07/20/20 0248 07/27/20 1857  BNP 2,185.0* 1,637.0* 2,631.0*    ProBNP (last 3 results) No results for input(s): PROBNP in the last 8760 hours.  CBG: No results for input(s): GLUCAP in the last 168 hours.  Radiological Exams on Admission: DG Chest Port 1 View  Result Date: 02/04/2021 CLINICAL DATA:  Left foot pain x3 weeks with chills and rash. EXAM: PORTABLE CHEST 1 VIEW COMPARISON:  November 26, 2020 FINDINGS: Mild, diffuse, chronic appearing increased interstitial lung markings are seen. This is unchanged in severity when compared to the prior study. There is no evidence of acute infiltrate, pleural effusion or pneumothorax. The cardiac silhouette is moderately enlarged and unchanged in size. The visualized skeletal structures are unremarkable. IMPRESSION: Stable cardiomegaly without evidence of acute or active cardiopulmonary disease. Electronically Signed   By: Virgina Norfolk M.D.   On: 02/04/2021 01:24   DG Foot Complete Left  Result Date: 02/04/2021 CLINICAL DATA:  Left foot pain x3 weeks with chills and rash. EXAM: LEFT FOOT - COMPLETE 3+ VIEW COMPARISON:  December 07, 2020 FINDINGS: Surgical amputation of the first and second left toes is noted with stable chronic and postoperative changes seen involving the third left toe. There is no evidence of an fracture or dislocation. No areas of cortical erosion or acute periosteal reaction are seen. Mild degenerative changes are noted along the dorsal aspect of the mid left foot. Loss of the plantar arch is noted.  There is mild diffuse soft tissue swelling which is more prominent along the plantar aspect of the mid left foot. IMPRESSION: 1. Stable chronic and postoperative changes, without evidence of acute osseous abnormality. 2. Plantar soft tissue swelling, as described above, without evidence of acute osteomyelitis. Electronically Signed   By: Virgina Norfolk M.D.   On: 02/04/2021 01:30    EKG: I independently viewed the EKG done and my findings are as followed: EKG was not done in the ED  Assessment/Plan Present on Admission:  Lower extremity cellulitis  Principal Problem:   Lower extremity cellulitis  Left lower extremity cellulitis Patient was started on Vanco and cefepime, we shall continue with IV ceftriaxone Continue Tylenol as needed for mild pain/fever Continue oxycodone for moderate/severe pain Continue wound care  ESRD on HD (MWF) Continue PhosLo and calcium carbonate Nephrology is consulted and dialysis will be done in the morning  Diffuse rash on patient's back The cause of patient's rash is unknown at this time Continue calamine lotion dur to itching  Macrocytic anemia MCV 106.7, vitamin B12 level and folate levels will be checked  Chronic  systolic  heart failure Continue volume management with hemodialysis Recent echocardiogram (04/08/2020) demonstrating ejection fraction 20 to 25% Continue carvedilol    History of A. fib Continue Coreg Continue Eliquis for secondary prevention.   History of type 2 diabetes mellitus Last A1c on 5/27 was a 5.1 Continue modified carbohydrate diet and resumption of home hypoglycemic regimen.   Mixed hyperlipidemia Continue Lipitor  DVT prophylaxis: Eliquis  Code Status: Full code  Family Communication: None at bedside  Disposition Plan:  Patient is from:                        home Anticipated DC to:                   SNF or  family members home Anticipated DC date:               2-3 days Anticipated DC barriers:          Patient requires inpatient management due to left lower extremity cellulitis requiring IV antibiotics and pending nephrology consult as well as dialysis  Consults called: Nephrology  Admission status: Inpatient    Bernadette Hoit MD [facility]  02/04/2021, 3:32 AM

## 2021-02-04 NOTE — Consult Note (Signed)
[facility] Nephrology Consult Note: Reason for Consult: To manage dialysis and dialysis related needs Referring Physician: Dr. Thereasa Solo, J  HPI:  Brendan Campbell is an 50 y.o. male with history of DM, HLD, anxiety depression, CHF, PVD, peripheral neuropathy, ESRD on HD MWF at [facility] presents with left leg cellulitis, seen as a consultation for the management of ESRD.  Per patient the left foot pain has worsened significantly so that he could not go to dialysis yesterday.  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  In the ED, patient was hemodynamically stable.  Influenza and COVID-negative.  Left foot x-ray without  osteomyelitis.  Patient was treated with broad-spectrum antibiotics including vancomycin and cefepime and admitted for further evaluation. During my evaluation, patient is comfortable, not in distress.  He denies nausea, vomiting, chest pain, shortness of breath.  No headache or dizziness. Blood pressure acceptable. The labs showed potassium 4.1, sodium 137, WBC 8.1 and hemoglobin 11.8. Chest x-ray with cardiomegaly without any acute finding.  Dialysis Order from previous admission in 08/2020:  [name]  on MWF  4h 17min  98 kg   1K/2.25 bath  Hep 1000+ 500u/hr  LUE AVG  300/500   - prosthetic weighs 1.6 kg  - Epogen 1000   Units IV/HD    Past Medical History:  Diagnosis Date   Anemia    Anxiety    Blood transfusion without reported diagnosis    CHF (congestive heart failure) (HCC)    a. EF 25% by echo in 07/2019   Chronic kidney disease    Depression    Diabetes mellitus without complication ([place])    End stage renal disease ([place])    M/W/F [facility]   Hyperlipidemia    Neuropathy    Peripheral vascular disease (HCC)    PTSD (post-traumatic stress disorder)     Past Surgical History:  Procedure Laterality Date   A/V FISTULAGRAM N/A 10/09/2018   Procedure: A/V FISTULAGRAM;  Surgeon: Serafina Mitchell, MD;  Location: [place] [facility];  Service:  Cardiovascular;  Laterality: N/A;   AV FISTULA PLACEMENT Left 09/22/2016   Procedure: CREATION OF LEFT ARM ARTERIOVENOUS (AV) FISTULA;  Surgeon: Waynetta Sandy, MD;  Location: [place];  Service: Vascular;  Laterality: Left;   AV FISTULA PLACEMENT Left 10/31/2017   Procedure: INSERTION OF ARTERIOVENOUS (AV) GORE-TEX 4-54mm STETCH GRAFT LEFT ARM;  Surgeon: Waynetta Sandy, MD;  Location: [place];  Service: Vascular;  Laterality: Left;   [employer] Left 08/17/2017   Procedure: SECOND STAGE BASILIC VEIN TRANSPOSITION LEFT ARM;  Surgeon: Waynetta Sandy, MD;  Location: [place];  Service: Vascular;  Laterality: Left;   BELOW KNEE LEG AMPUTATION Right    CARDIOVERSION N/A 02/19/2019   Procedure: CARDIOVERSION;  Surgeon: [name], MD;  Location: [place];  Service: Cardiovascular;  Laterality: N/A;   CATARACT EXTRACTION W/PHACO Right 10/23/2020   Procedure: CATARACT EXTRACTION PHACO AND INTRAOCULAR LENS PLACEMENT ([place]);  Surgeon: Baruch Goldmann, MD;  Location: [facility];  Service: Ophthalmology;  Laterality: Right;  CDE 39.03   CHOLECYSTECTOMY     FOOT SURGERY     IR FLUORO GUIDE CV LINE RIGHT  05/16/2016   IR FLUORO GUIDE CV LINE RIGHT  02/16/2019   IR REMOVAL TUN CV CATH W/O FL  05/16/2016   IR REMOVAL TUN CV CATH W/O FL  02/19/2019   IR THROMBECTOMY AV FISTULA W/THROMBOLYSIS/PTA INC/SHUNT/IMG LEFT Left 11/09/2018   IR THROMBECTOMY AV FISTULA W/THROMBOLYSIS/PTA INC/SHUNT/IMG LEFT  Left 06/22/2019   IR US GUIDE VASC ACCESS LEFT  11/09/2018   IR US GUIDE VASC ACCESS LEFT  06/22/2019   IR US GUIDE VASC ACCESS RIGHT  05/16/2016   IR US GUIDE VASC ACCESS RIGHT  02/16/2019   PERIPHERAL VASCULAR BALLOON ANGIOPLASTY  10/09/2018   Procedure: PERIPHERAL VASCULAR BALLOON ANGIOPLASTY;  Surgeon: Serafina Mitchell, MD;  Location: [place] [facility];  Service: Cardiovascular;;   TEE WITHOUT CARDIOVERSION N/A 02/19/2019   Procedure: TRANSESOPHAGEAL ECHOCARDIOGRAM (TEE);  Surgeon: [name], MD;  Location: [facility];  Service: Cardiovascular;  Laterality: N/A;    Family History  Problem Relation Age of Onset   Cancer Mother        lung   Diabetes Mother    Heart attack Father    Diabetes Father    Diabetes Sister     Social History:  reports that he has been smoking. He has never used smokeless tobacco. He reports that he does not drink alcohol and does not use drugs.  Allergies:  Allergies  Allergen Reactions   Tape Other (See Comments)    Pulls skin off    Medications: I have reviewed the patient's current medications.   Results for orders placed or performed during the hospital encounter of 02/03/21 (from the past 48 hour(s))  Comprehensive metabolic panel     Status: Abnormal   Collection Time: 02/04/21 12:39 AM  Result Value Ref Range   Sodium 137 135 - 145 mmol/L   Potassium 4.1 3.5 - 5.1 mmol/L   Chloride 97 (L) 98 - 111 mmol/L   CO2 25 22 - 32 mmol/L   Glucose, Bld 79 70 - 99 mg/dL    Comment: Glucose reference range applies only to samples taken after fasting for at least 8 hours.   BUN 39 (H) 6 - 20 mg/dL   Creatinine, Ser 6.59 (H) 0.61 - 1.24 mg/dL   Calcium 8.1 (L) 8.9 - 10.3 mg/dL   Total Protein 7.5 6.5 - 8.1 g/dL   Albumin 3.7 3.5 - 5.0 g/dL   AST 16 15 - 41 U/L   ALT 7 0 - 44 U/L   Alkaline Phosphatase 83 38 - 126 U/L   Total Bilirubin 0.7 0.3 - 1.2 mg/dL   GFR, Estimated 10 (L) >60 mL/min    Comment: (NOTE) Calculated using the CKD-EPI Creatinine Equation (2021)    Anion gap 15 5 - 15    Comment: Performed at [facility], [address] [place], [place] [address]  CBC WITH DIFFERENTIAL     Status: Abnormal   Collection Time: 02/04/21 12:39 AM  Result Value Ref Range   WBC 8.1 4.0 - 10.5 K/uL   RBC 3.45 (L) 4.22 - 5.81 MIL/uL   Hemoglobin 11.8 (L) 13.0 - 17.0 g/dL   HCT 36.8 (L) 39.0 - 52.0 %   MCV 106.7 (H) 80.0 - 100.0 fL   MCH 34.2 (H) 26.0 - 34.0 pg   MCHC 32.1 30.0 - 36.0 g/dL   RDW 18.5 (H) 11.5 - 15.5 %    Platelets 215 150 - 400 K/uL   nRBC 0.7 (H) 0.0 - 0.2 %   Neutrophils Relative % 75 %   Neutro Abs 6.1 1.7 - 7.7 K/uL   Lymphocytes Relative 13 %   Lymphs Abs 1.1 0.7 - 4.0 K/uL   Monocytes Relative 5 %   Monocytes Absolute 0.4 0.1 - 1.0 K/uL   Eosinophils Relative 5 %   Eosinophils Absolute 0.4 0.0 -  0.5 K/uL   Basophils Relative 1 %   Basophils Absolute 0.1 0.0 - 0.1 K/uL   Immature Granulocytes 1 %   Abs Immature Granulocytes 0.04 0.00 - 0.07 K/uL    Comment: Performed at [facility], [address] [place], [place] [address]  Protime-INR     Status: None   Collection Time: 02/04/21 12:39 AM  Result Value Ref Range   Prothrombin Time 14.3 11.4 - 15.2 seconds   INR 1.1 0.8 - 1.2    Comment: (NOTE) INR goal varies based on device and disease states. Performed at [facility], [address], [place], [place] [address]   APTT     Status: None   Collection Time: 02/04/21 12:39 AM  Result Value Ref Range   aPTT 32 24 - 36 seconds    Comment: Performed at [facility], [address], [place], [place] [address]  Lactic acid, plasma     Status: None   Collection Time: 02/04/21  1:22 AM  Result Value Ref Range   Lactic Acid, Venous 1.3 0.5 - 1.9 mmol/L    Comment: Performed at [facility], [address], [place], [place] [address]  Blood Culture (routine x 2)     Status: None (Preliminary result)   Collection Time: 02/04/21  1:22 AM   Specimen: Right Antecubital; Blood  Result Value Ref Range   Specimen Description RIGHT ANTECUBITAL    Special Requests      BOTTLES DRAWN AEROBIC AND ANAEROBIC Blood Culture adequate volume Performed at [facility], [address], [place], [place] [address]    Culture PENDING    Report Status PENDING   Blood Culture (routine x 2)     Status: None (Preliminary result)   Collection Time: 02/04/21  1:22 AM   Specimen: BLOOD RIGHT HAND  Result Value Ref Range   Specimen Description BLOOD RIGHT HAND    Special Requests      BOTTLES DRAWN  AEROBIC AND ANAEROBIC Blood Culture adequate volume Performed at [facility], [address] [place], [place] [address]    Culture PENDING    Report Status PENDING   Lactic acid, plasma     Status: None   Collection Time: 02/04/21  2:53 AM  Result Value Ref Range   Lactic Acid, Venous 1.3 0.5 - 1.9 mmol/L    Comment: Performed at [facility], [address] [place], [place] [address]  Resp Panel by RT-PCR (Flu A&B, Covid) Nasopharyngeal Swab     Status: None   Collection Time: 02/04/21  2:53 AM   Specimen: Nasopharyngeal Swab; Nasopharyngeal(NP) swabs in vial transport medium  Result Value Ref Range   SARS Coronavirus 2 by RT PCR NEGATIVE NEGATIVE    Comment: (NOTE) SARS-CoV-2 target nucleic acids are NOT DETECTED.  The SARS-CoV-2 RNA is generally detectable in upper respiratory specimens during the acute phase of infection. The lowest concentration of SARS-CoV-2 viral copies this assay can detect is 138 copies/mL. A negative result does not preclude SARS-Cov-2 infection and should not be used as the sole basis for treatment or other patient management decisions. A negative result may occur with  improper specimen collection/handling, submission of specimen other than nasopharyngeal swab, presence of viral mutation(s) within the areas targeted by this assay, and inadequate number of viral copies(<138 copies/mL). A negative result must be combined with clinical observations, patient history, and epidemiological information. The expected result is Negative.  Fact Sheet for Patients:  EntrepreneurPulse.com.au  Fact Sheet for Healthcare Providers:  IncredibleEmployment.be  This test is no t yet approved or cleared by the [place] and  has been authorized for detection and/or diagnosis of SARS-CoV-2 by FDA under an Emergency Use Authorization (EUA). This EUA will remain  in effect (meaning this test can be used) for the duration of  the COVID-19 declaration under Section 564(b)(1) of the Act, 21 U.S.C.section 360bbb-3(b)(1), unless the authorization is terminated  or revoked sooner.       Influenza A by PCR NEGATIVE NEGATIVE   Influenza B by PCR NEGATIVE NEGATIVE    Comment: (NOTE) The Xpert Xpress SARS-CoV-2/FLU/RSV plus assay is intended as an aid in the diagnosis of influenza from Nasopharyngeal swab specimens and should not be used as a sole basis for treatment. Nasal washings and aspirates are unacceptable for Xpert Xpress SARS-CoV-2/FLU/RSV testing.  Fact Sheet for Patients: EntrepreneurPulse.com.au  Fact Sheet for Healthcare Providers: IncredibleEmployment.be  This test is not yet approved or cleared by the [place] FDA and has been authorized for detection and/or diagnosis of SARS-CoV-2 by FDA under an Emergency Use Authorization (EUA). This EUA will remain in effect (meaning this test can be used) for the duration of the COVID-19 declaration under Section 564(b)(1) of the Act, 21 U.S.C. section 360bbb-3(b)(1), unless the authorization is terminated or revoked.  Performed at [facility], [address] [place], [place] [address]   Magnesium     Status: None   Collection Time: 02/04/21  4:46 AM  Result Value Ref Range   Magnesium 2.0 1.7 - 2.4 mg/dL    Comment: Performed at [facility], [address] [place], [place] [address]  Phosphorus     Status: Abnormal   Collection Time: 02/04/21  4:46 AM  Result Value Ref Range   Phosphorus 6.2 (H) 2.5 - 4.6 mg/dL    Comment: Performed at [facility], [address], [place], [place] [address]  Vitamin B12     Status: None   Collection Time: 02/04/21  7:27 AM  Result Value Ref Range   Vitamin B-12 757 180 - 914 pg/mL    Comment: (NOTE) This assay is not validated for testing neonatal or myeloproliferative syndrome specimens for Vitamin B12 levels. Performed at [facility], [address], [place],  [place] [address]   Folate     Status: None   Collection Time: 02/04/21  7:27 AM  Result Value Ref Range   Folate 10.3 >5.9 ng/mL    Comment: Performed at [facility], [address], [place], [place] [address]    DG Chest Port 1 View  Result Date: 02/04/2021 CLINICAL DATA:  Left foot pain x3 weeks with chills and rash. EXAM: PORTABLE CHEST 1 VIEW COMPARISON:  November 26, 2020 FINDINGS: Mild, diffuse, chronic appearing increased interstitial lung markings are seen. This is unchanged in severity when compared to the prior study. There is no evidence of acute infiltrate, pleural effusion or pneumothorax. The cardiac silhouette is moderately enlarged and unchanged in size. The visualized skeletal structures are unremarkable. IMPRESSION: Stable cardiomegaly without evidence of acute or active cardiopulmonary disease. Electronically Signed   By: Virgina Norfolk M.D.   On: 02/04/2021 01:24   DG Foot Complete Left  Result Date: 02/04/2021 CLINICAL DATA:  Left foot pain x3 weeks with chills and rash. EXAM: LEFT FOOT - COMPLETE 3+ VIEW COMPARISON:  December 07, 2020 FINDINGS: Surgical amputation of the first and second left toes is noted with stable chronic and postoperative changes seen involving the third left toe. There is no evidence of  an fracture or dislocation. No areas of cortical erosion or acute periosteal reaction are seen. Mild degenerative changes are noted along the dorsal aspect of the mid left foot. Loss of the plantar arch is noted. There is mild diffuse soft tissue swelling which is more prominent along the plantar aspect of the mid left foot. IMPRESSION: 1. Stable chronic and postoperative changes, without evidence of acute osseous abnormality. 2. Plantar soft tissue swelling, as described above, without evidence of acute osteomyelitis. Electronically Signed   By: Virgina Norfolk M.D.   On: 02/04/2021 01:30    ROS: As per H&P.  Rest of the systems reviewed and are negative Blood pressure  122/87, pulse 98, temperature (!) 97.5 F (36.4 C), temperature source Oral, resp. rate 15, height 5\' 9"  (1.753 m), weight 102.1 kg, SpO2 97 %. Gen: NAD, comfortable Respiratory: Clear bilateral, no wheezing or crackle Cardiovascular: Regular rate rhythm S1-S2 normal, no rubs GI: Abdomen soft, nontender, nondistended Extremities, no cyanosis or clubbing, no edema Skin: No rash or ulcer Neurology: Alert, awake, following commands, oriented Dialysis Access: Left upper extremity AV graft has good thrill and bruit.  Assessment/Plan:  #Left leg cellulitis: Received vancomycin and cefepime in the ER.  Patient with peripheral neuropathy and peripheral vascular disease.  Primary team is managing.  # ESRD: MWF: Missed HD yesterday because of severe pain.  The volume status and electrolytes are acceptable.  No urgent need for dialysis today therefore we will plan for regular HD tomorrow, in the context of increased volume of patient requiring dialysis.  Left upper extremities AV graft for the access.  Try to obtain outpatient record.  # Hypertension: Blood pressure and volume status acceptable.  On carvedilol.  UF with HD tomorrow.  # Anemia of ESRD: Hemoglobin at goal.  Continue to monitor.  # Metabolic Bone Disease: Continue PhosLo for the management of hyperphosphatemia.  Monitor lab.  Thank you for the consult.  Discussed with the primary team.  Rosita Fire 02/04/2021, 9:32 AM

## 2021-02-04 NOTE — Progress Notes (Signed)
[employer]  HWE:[id number] DOB: 1970-10-01 DOA: 02/03/2021 PCP: Monico Blitz, MD    Brief Narrative:  50 year old with a history of ESRD on HD MWF, chronic anemia, anxiety, depression, PTSD, chronic systolic CHF, chronic hypoxic respiratory failure on 2 L/min nasal cannula support, DM2, HLD, diabetic peripheral neuropathy, and PVD status post right BKA who presented to the ED with 2-3 weeks of progressive left foot pain with redness and swelling.  Consultants:  Nephrology  Code Status: FULL CODE  Antimicrobials:  Rocephin 12/29 > Cefepime 12/28 Vancomycin 12/28  DVT prophylaxis: Continue Eliquis  Subjective: Pt was seen for a f/u visit.   Assessment & Plan:  Left leg cellulitis X-rays without evidence of osteomyelitis - improving w/ current IV abx tx   ESRD on HD MWF Completed his dialysis treatment 12/26  Diffuse rash of back Patient states this dates all the way back to October 2022 following a hospital admission at [facility]  Macrocytic anemia E93 and folic acid are not low   Chronic systolic CHF TTE March 8101 noted EF 20-25%  Chronic atrial fibrillation Continue Coreg and Eliquis  DM2 CBG well controlled   Hyperlipidemia Continue Lipitor   Family Communication:  Status is: Inpatient   Objective: Blood pressure 119/84, pulse (!) 101, temperature 97.9 F (36.6 C), temperature source Oral, resp. rate (!) 23, height 5\' 9"  (1.753 m), weight 102.1 kg, SpO2 99 %.  Intake/Output Summary (Last 24 hours) at 02/04/2021 1038 Last data filed at 02/04/2021 0444 Gross per 24 hour  Intake 200 ml  Output --  Net 200 ml   Filed Weights   02/03/21 1924  Weight: 102.1 kg    Examination: Pt was seen for a f/u visit.   CBC: Recent Labs  Lab 02/04/21 0039  WBC 8.1  NEUTROABS 6.1  HGB 11.8*  HCT 36.8*  MCV 106.7*  PLT 751   Basic Metabolic Panel: Recent Labs  Lab 02/04/21 0039 02/04/21 0446  NA 137  --   K 4.1  --   CL 97*  --   CO2 25  --    GLUCOSE 79  --   BUN 39*  --   CREATININE 6.59*  --   CALCIUM 8.1*  --   MG  --  2.0  PHOS  --  6.2*   GFR: Estimated Creatinine Clearance: 16 mL/min (A) (by C-G formula based on SCr of 6.59 mg/dL (H)).  Liver Function Tests: Recent Labs  Lab 02/04/21 0039  AST 16  ALT 7  ALKPHOS 83  BILITOT 0.7  PROT 7.5  ALBUMIN 3.7    Coagulation Profile: Recent Labs  Lab 02/04/21 0039  INR 1.1    HbA1C: HB A1C (BAYER DCA - WAIVED)  Date/Time Value Ref Range Status  12/16/2015 09:04 AM 5.4 <7.0 % Final    Comment:                                          Diabetic Adult            <7.0                                       Healthy Adult        4.3 - 5.7                                                           (  DCCT/NGSP) American Diabetes Association's Summary of Glycemic Recommendations for Adults with Diabetes: Hemoglobin A1c <7.0%. More stringent glycemic goals (A1c <6.0%) may further reduce complications at the cost of increased risk of hypoglycemia.   09/15/2015 08:22 AM 6.5 <7.0 % Final    Comment:                                          Diabetic Adult            <7.0                                       Healthy Adult        4.3 - 5.7                                                           (DCCT/NGSP) American Diabetes Association's Summary of Glycemic Recommendations for Adults with Diabetes: Hemoglobin A1c <7.0%. More stringent glycemic goals (A1c <6.0%) may further reduce complications at the cost of increased risk of hypoglycemia.    Hgb A1c MFr Bld  Date/Time Value Ref Range Status  07/03/2020 06:17 AM 5.1 4.8 - 5.6 % Final    Comment:    (NOTE)         Prediabetes: 5.7 - 6.4         Diabetes: >6.4         Glycemic control for adults with diabetes: <7.0   04/08/2020 07:32 AM 5.3 4.8 - 5.6 % Final    Comment:    (NOTE) Pre diabetes:          5.7%-6.4%  Diabetes:              >6.4%  Glycemic control for   <7.0% adults with diabetes       Recent Results (from the past 240 hour(s))  Blood Culture (routine x 2)     Status: None (Preliminary result)   Collection Time: 02/04/21  1:22 AM   Specimen: Right Antecubital; Blood  Result Value Ref Range Status   Specimen Description RIGHT ANTECUBITAL  Final   Special Requests   Final    BOTTLES DRAWN AEROBIC AND ANAEROBIC Blood Culture adequate volume Performed at [facility], [address] [place], [place] [address]    Culture PENDING  Incomplete   Report Status PENDING  Incomplete  Blood Culture (routine x 2)     Status: None (Preliminary result)   Collection Time: 02/04/21  1:22 AM   Specimen: BLOOD RIGHT HAND  Result Value Ref Range Status   Specimen Description BLOOD RIGHT HAND  Final   Special Requests   Final    BOTTLES DRAWN AEROBIC AND ANAEROBIC Blood Culture adequate volume Performed at [facility], [address] [place], [place] [address]    Culture PENDING  Incomplete   Report Status PENDING  Incomplete  Resp Panel by RT-PCR (Flu A&B, Covid) Nasopharyngeal Swab     Status: None   Collection Time: 02/04/21  2:53 AM   Specimen: Nasopharyngeal Swab; Nasopharyngeal(NP) swabs in vial transport medium  Result Value Ref Range Status   SARS Coronavirus 2 by  RT PCR NEGATIVE NEGATIVE Final    Comment: (NOTE) SARS-CoV-2 target nucleic acids are NOT DETECTED.  The SARS-CoV-2 RNA is generally detectable in upper respiratory specimens during the acute phase of infection. The lowest concentration of SARS-CoV-2 viral copies this assay can detect is 138 copies/mL. A negative result does not preclude SARS-Cov-2 infection and should not be used as the sole basis for treatment or other patient management decisions. A negative result may occur with  improper specimen collection/handling, submission of specimen other than nasopharyngeal swab, presence of viral mutation(s) within the areas targeted by this assay, and inadequate number of viral copies(<138 copies/mL). A  negative result must be combined with clinical observations, patient history, and epidemiological information. The expected result is Negative.  Fact Sheet for Patients:  EntrepreneurPulse.com.au  Fact Sheet for Healthcare Providers:  IncredibleEmployment.be  This test is no t yet approved or cleared by the [place] FDA and  has been authorized for detection and/or diagnosis of SARS-CoV-2 by FDA under an Emergency Use Authorization (EUA). This EUA will remain  in effect (meaning this test can be used) for the duration of the COVID-19 declaration under Section 564(b)(1) of the Act, 21 U.S.C.section 360bbb-3(b)(1), unless the authorization is terminated  or revoked sooner.       Influenza A by PCR NEGATIVE NEGATIVE Final   Influenza B by PCR NEGATIVE NEGATIVE Final    Comment: (NOTE) The Xpert Xpress SARS-CoV-2/FLU/RSV plus assay is intended as an aid in the diagnosis of influenza from Nasopharyngeal swab specimens and should not be used as a sole basis for treatment. Nasal washings and aspirates are unacceptable for Xpert Xpress SARS-CoV-2/FLU/RSV testing.  Fact Sheet for Patients: EntrepreneurPulse.com.au  Fact Sheet for Healthcare Providers: IncredibleEmployment.be  This test is not yet approved or cleared by the [place] FDA and has been authorized for detection and/or diagnosis of SARS-CoV-2 by FDA under an Emergency Use Authorization (EUA). This EUA will remain in effect (meaning this test can be used) for the duration of the COVID-19 declaration under Section 564(b)(1) of the Act, 21 U.S.C. section 360bbb-3(b)(1), unless the authorization is terminated or revoked.  Performed at [facility], [address] [place],  [address]      Scheduled Meds:  apixaban  5 mg Oral BID   atorvastatin  10 mg Oral QPM   calcium acetate  667 mg Oral TID WC   calcium carbonate  800 mg of  elemental calcium Oral BID   carvedilol  3.125 mg Oral BID WC   Chlorhexidine Gluconate Cloth  6 each Topical Q0600   pantoprazole  40 mg Oral Daily   Continuous Infusions:  cefTRIAXone (ROCEPHIN)  IV 2 g (02/04/21 0937)     LOS: 0 days   Cherene Altes, MD [facility] Office  [phone] Pager - Text Page per Shea Evans  If 7PM-7AM, please contact night-coverage per Amion 02/04/2021, 10:38 AM

## 2021-02-05 DIAGNOSIS — L03119 Cellulitis of unspecified part of limb: Secondary | ICD-10-CM

## 2021-02-05 LAB — COMPREHENSIVE METABOLIC PANEL
ALT: 7 U/L (ref 0–44)
AST: 13 U/L — ABNORMAL LOW (ref 15–41)
Albumin: 3.3 g/dL — ABNORMAL LOW (ref 3.5–5.0)
Alkaline Phosphatase: 73 U/L (ref 38–126)
Anion gap: 15 (ref 5–15)
BUN: 48 mg/dL — ABNORMAL HIGH (ref 6–20)
CO2: 24 mmol/L (ref 22–32)
Calcium: 8.5 mg/dL — ABNORMAL LOW (ref 8.9–10.3)
Chloride: 98 mmol/L (ref 98–111)
Creatinine, Ser: 7.34 mg/dL — ABNORMAL HIGH (ref 0.61–1.24)
GFR, Estimated: 8 mL/min — ABNORMAL LOW (ref >60)
Glucose, Bld: 74 mg/dL (ref 70–99)
Potassium: 4.8 mmol/L (ref 3.5–5.1)
Sodium: 137 mmol/L (ref 135–145)
Total Bilirubin: 0.7 mg/dL (ref 0.3–1.2)
Total Protein: 6.9 g/dL (ref 6.5–8.1)

## 2021-02-05 LAB — BLOOD CULTURE ID PANEL (REFLEXED) - BCID2

## 2021-02-05 LAB — CBC
HCT: 33.4 % — ABNORMAL LOW (ref 39.0–52.0)
Hemoglobin: 10.7 g/dL — ABNORMAL LOW (ref 13.0–17.0)
MCH: 34.4 pg — ABNORMAL HIGH (ref 26.0–34.0)
MCHC: 32 g/dL (ref 30.0–36.0)
MCV: 107.4 fL — ABNORMAL HIGH (ref 80.0–100.0)
Platelets: 187 10*3/uL (ref 150–400)
RBC: 3.11 MIL/uL — ABNORMAL LOW (ref 4.22–5.81)
RDW: 18.4 % — ABNORMAL HIGH (ref 11.5–15.5)
WBC: 8.8 10*3/uL (ref 4.0–10.5)
nRBC: 0.5 % — ABNORMAL HIGH (ref 0.0–0.2)

## 2021-02-05 LAB — GLUCOSE, CAPILLARY
Glucose-Capillary: 73 mg/dL (ref 70–99)
Glucose-Capillary: 92 mg/dL (ref 70–99)
Glucose-Capillary: 192 mg/dL — ABNORMAL HIGH (ref 70–99)
Glucose-Capillary: 61 mg/dL — ABNORMAL LOW (ref 70–99)
Glucose-Capillary: 64 mg/dL — ABNORMAL LOW (ref 70–99)

## 2021-02-05 LAB — HEMOGLOBIN A1C
Hgb A1c MFr Bld: 5.5 % (ref 4.8–5.6)
Mean Plasma Glucose: 111.15 mg/dL

## 2021-02-05 LAB — HEPATITIS B SURFACE ANTIBODY, QUANTITATIVE: Hep B S AB Quant (Post): 203.7 m[IU]/mL (ref >9.9)

## 2021-02-05 MED ORDER — VANCOMYCIN HCL IN DEXTROSE 1-5 GM/200ML-% IV SOLN: 1000.0000 mg | INTRAVENOUS | Status: DC

## 2021-02-05 MED ORDER — FENTANYL CITRATE PF 50 MCG/ML IJ SOSY
12.5000 ug | PREFILLED_SYRINGE | INTRAMUSCULAR | Status: DC | PRN
  Administered 2021-02-05: 17:00:00 12.5 ug via INTRAVENOUS
  Filled 2021-02-05: qty 1

## 2021-02-05 MED ORDER — VANCOMYCIN HCL 2000 MG/400ML IV SOLN
2000.0000 mg | Freq: Once | INTRAVENOUS | Status: AC
  Administered 2021-02-05: 15:00:00 2000 mg via INTRAVENOUS
  Filled 2021-02-05: qty 400

## 2021-02-05 MED ORDER — DIPHENHYDRAMINE HCL 25 MG PO CAPS
25.0000 mg | ORAL_CAPSULE | Freq: Four times a day (QID) | ORAL | Status: DC | PRN
  Administered 2021-02-05 (×2): 25 mg via ORAL
  Filled 2021-02-05 (×2): qty 1

## 2021-02-05 MED ORDER — CEFAZOLIN SODIUM-DEXTROSE 2-4 GM/100ML-% IV SOLN
2.0000 g | INTRAVENOUS | Status: DC
Start: 2021-02-05 — End: 2021-02-05

## 2021-02-05 MED ORDER — LIDOCAINE HCL (PF) 1 % IJ SOLN: 5.0000 mL | INTRAMUSCULAR | Status: DC | PRN

## 2021-02-05 MED ORDER — PENTAFLUOROPROP-TETRAFLUOROETH EX AERO: 1.0000 "application " | INHALATION_SPRAY | CUTANEOUS | Status: DC | PRN

## 2021-02-05 MED ORDER — LIDOCAINE-PRILOCAINE 2.5-2.5 % EX CREA: 1.0000 "application " | TOPICAL_CREAM | CUTANEOUS | Status: DC | PRN

## 2021-02-05 MED ORDER — SODIUM CHLORIDE 0.9 % IV SOLN: 100.0000 mL | INTRAVENOUS | Status: DC | PRN

## 2021-02-05 NOTE — Progress Notes (Signed)
Brendan Campbell  HUD:[id number] DOB: [birth year] DOA: 02/03/2021 PCP: Monico Blitz, MD    Brief Narrative:  50 year old with a history of ESRD on HD MWF, chronic anemia, anxiety, depression, PTSD, chronic systolic CHF, chronic hypoxic respiratory failure on 2 L/min nasal cannula support, DM2, HLD, diabetic peripheral neuropathy, and PVD status post right BKA who presented to the ED with 2-3 weeks of progressive left foot pain with redness and swelling.  Consultants:  Nephrology  Code Status: FULL CODE  Antimicrobials:  Rocephin 12/29 > Cefepime 12/28 Vancomycin 12/28  DVT prophylaxis: Continue Eliquis  Subjective: Afebrile. Vitals stable. No new complaints. Feels his leg is improved. Continues to have signif itching related to "rash"/lesions across back. Denies cp, sob, n/v. Pain in leg/foot controlled.   Assessment & Plan:  Left leg cellulitis X-rays without evidence of osteomyelitis - improving w/ current IV abx tx - abx adjusted given findings on blood cx as discussed below  1 of 2 blood cx + MRSA Changing abx to Vancomycin - ID consulted and suggests 6 weeks of empiric tx (with HD) and TTE - TTE ordered and currently pending - no M on exam   ESRD on HD MWF Nephrology attended to his HD during this admission   Diffuse rash of back Patient states this dates all the way back to October 2022 following a hospital admission at [facility] - etiology not clear on simple visual inspection - if persists an outpatient Dermatology referral for biopsy to aid in diagnosis would be appropriate   Macrocytic anemia C58 and folic acid are not low   Chronic systolic CHF TTE March 8502 noted EF 20-25% - volume control per HD - no signif overload at this time/well compensated - f/u TTE pending (to r/o SBE)   Chronic atrial fibrillation Continue Coreg and Eliquis  DM2 CBG well controlled   Hyperlipidemia Continue Lipitor   Family Communication: no family present at time of exam  Status  is: Inpatient   Objective: Blood pressure (!) 174/74, pulse (!) 59, temperature 98 F (36.7 C), resp. rate 17, height 5\' 9"  (1.753 m), weight 93.3 kg, SpO2 97 %.  Intake/Output Summary (Last 24 hours) at 02/05/2021 0803 Last data filed at 02/04/2021 1700 Gross per 24 hour  Intake 695 ml  Output --  Net 695 ml    Filed Weights   02/03/21 1924 02/04/21 1445  Weight: 102.1 kg 93.3 kg    Examination: General: No acute respiratory distress Lungs: Clear to auscultation bilaterally - no wheezing  Cardiovascular: RRR - no M or rub  Abdomen: Nontender, nondistended, soft, bowel sounds positive, no rebound, no ascites, no appreciable mass Extremities: wound on R AKA stump appears to be drying up w/o purulent d/c or worsening erythema - L LE with decreased/nearly resolved erythema of forefoot and ankle region w/o purulent d/c    CBC: Recent Labs  Lab 02/04/21 0039 02/05/21 0518  WBC 8.1 8.8  NEUTROABS 6.1  --   HGB 11.8* 10.7*  HCT 36.8* 33.4*  MCV 106.7* 107.4*  PLT 215 774    Basic Metabolic Panel: Recent Labs  Lab 02/04/21 0039 02/04/21 0446 02/05/21 0518  NA 137  --  137  K 4.1  --  4.8  CL 97*  --  98  CO2 25  --  24  GLUCOSE 79  --  74  BUN 39*  --  48*  CREATININE 6.59*  --  7.34*  CALCIUM 8.1*  --  8.5*  MG  --  2.0  --   PHOS  --  6.2*  --     GFR: Estimated Creatinine Clearance: 13.7 mL/min (A) (by C-G formula based on SCr of 7.34 mg/dL (H)).  Liver Function Tests: Recent Labs  Lab 02/04/21 0039 02/05/21 0518  AST 16 13*  ALT 7 7  ALKPHOS 83 73  BILITOT 0.7 0.7  PROT 7.5 6.9  ALBUMIN 3.7 3.3*     Coagulation Profile: Recent Labs  Lab 02/04/21 0039  INR 1.1     HbA1C: HB A1C (BAYER DCA - WAIVED)  Date/Time Value Ref Range Status  12/16/2015 09:04 AM 5.4 <7.0 % Final    Comment:                                          Diabetic Adult            <7.0                                       Healthy Adult        4.3 - 5.7                                                            (DCCT/NGSP) American Diabetes Association's Summary of Glycemic Recommendations for Adults with Diabetes: Hemoglobin A1c <7.0%. More stringent glycemic goals (A1c <6.0%) may further reduce complications at the cost of increased risk of hypoglycemia.   09/15/2015 08:22 AM 6.5 <7.0 % Final    Comment:                                          Diabetic Adult            <7.0                                       Healthy Adult        4.3 - 5.7                                                           (DCCT/NGSP) American Diabetes Association's Summary of Glycemic Recommendations for Adults with Diabetes: Hemoglobin A1c <7.0%. More stringent glycemic goals (A1c <6.0%) may further reduce complications at the cost of increased risk of hypoglycemia.    Hgb A1c MFr Bld  Date/Time Value Ref Range Status  07/03/2020 06:17 AM 5.1 4.8 - 5.6 % Final    Comment:    (NOTE)         Prediabetes: 5.7 - 6.4         Diabetes: >6.4         Glycemic control for adults with diabetes: <7.0   04/08/2020 07:32 AM 5.3 4.8 - 5.6 % Final  Comment:    (NOTE) Pre diabetes:          5.7%-6.4%  Diabetes:              >6.4%  Glycemic control for   <7.0% adults with diabetes      Recent Results (from the past 240 hour(s))  Blood Culture (routine x 2)     Status: None (Preliminary result)   Collection Time: 02/04/21  1:22 AM   Specimen: Right Antecubital; Blood  Result Value Ref Range Status   Specimen Description RIGHT ANTECUBITAL  Final   Special Requests   Final    BOTTLES DRAWN AEROBIC AND ANAEROBIC Blood Culture adequate volume   Culture  Setup Time   Final    GRAM POSITIVE COCCI both ANAEROBIC AND AEROBIC BOTTLES Gram Stain Report Called to,Read Back By and Verified With: THOMAS, K@[id number]  BY MATTHEWS, B 12.30.22 Performed at [facility], [address], [place], [place] [address]    Culture PENDING  Incomplete   Report Status PENDING  Incomplete   Blood Culture (routine x 2)     Status: None (Preliminary result)   Collection Time: 02/04/21  1:22 AM   Specimen: BLOOD RIGHT HAND  Result Value Ref Range Status   Specimen Description BLOOD RIGHT HAND  Final   Special Requests   Final    BOTTLES DRAWN AEROBIC AND ANAEROBIC Blood Culture adequate volume   Culture   Final    NO GROWTH 1 DAY Performed at [facility], [address] [place],  [address]    Report Status PENDING  Incomplete  Resp Panel by RT-PCR (Flu A&B, Covid) Nasopharyngeal Swab     Status: None   Collection Time: 02/04/21  2:53 AM   Specimen: Nasopharyngeal Swab; Nasopharyngeal(NP) swabs in vial transport medium  Result Value Ref Range Status   SARS Coronavirus 2 by RT PCR NEGATIVE NEGATIVE Final    Comment: (NOTE) SARS-CoV-2 target nucleic acids are NOT DETECTED.  The SARS-CoV-2 RNA is generally detectable in upper respiratory specimens during the acute phase of infection. The lowest concentration of SARS-CoV-2 viral copies this assay can detect is 138 copies/mL. A negative result does not preclude SARS-Cov-2 infection and should not be used as the sole basis for treatment or other patient management decisions. A negative result may occur with  improper specimen collection/handling, submission of specimen other than nasopharyngeal swab, presence of viral mutation(s) within the areas targeted by this assay, and inadequate number of viral copies(<138 copies/mL). A negative result must be combined with clinical observations, patient history, and epidemiological information. The expected result is Negative.  Fact Sheet for Patients:  EntrepreneurPulse.com.au  Fact Sheet for Healthcare Providers:  IncredibleEmployment.be  This test is no t yet approved or cleared by the [place] FDA and  has been authorized for detection and/or diagnosis of SARS-CoV-2 by FDA under an Emergency Use Authorization (EUA). This EUA  will remain  in effect (meaning this test can be used) for the duration of the COVID-19 declaration under Section 564(b)(1) of the Act, 21 U.S.C.section 360bbb-3(b)(1), unless the authorization is terminated  or revoked sooner.       Influenza A by PCR NEGATIVE NEGATIVE Final   Influenza B by PCR NEGATIVE NEGATIVE Final    Comment: (NOTE) The Xpert Xpress SARS-CoV-2/FLU/RSV plus assay is intended as an aid in the diagnosis of influenza from Nasopharyngeal swab specimens and should not be used as a sole basis for treatment. Nasal washings and aspirates are unacceptable for Xpert Xpress  SARS-CoV-2/FLU/RSV testing.  Fact Sheet for Patients: EntrepreneurPulse.com.au  Fact Sheet for Healthcare Providers: IncredibleEmployment.be  This test is not yet approved or cleared by the [place] FDA and has been authorized for detection and/or diagnosis of SARS-CoV-2 by FDA under an Emergency Use Authorization (EUA). This EUA will remain in effect (meaning this test can be used) for the duration of the COVID-19 declaration under Section 564(b)(1) of the Act, 21 U.S.C. section 360bbb-3(b)(1), unless the authorization is terminated or revoked.  Performed at [facility], [address] [place], [place] [address]      Scheduled Meds:  apixaban  5 mg Oral BID   atorvastatin  10 mg Oral QPM   calcium acetate  667 mg Oral TID WC   calcium carbonate  800 mg of elemental calcium Oral BID   carvedilol  3.125 mg Oral BID WC   Chlorhexidine Gluconate Cloth  6 each Topical Q0600   cholecalciferol  1,000 Units Oral Q M,W,F-HD   insulin aspart  0-6 Units Subcutaneous TID WC   pantoprazole  40 mg Oral Daily   Continuous Infusions:  cefTRIAXone (ROCEPHIN)  IV Stopped (02/04/21 1306)     LOS: 1 day   Cherene Altes, MD [facility] Office  [phone] Pager - Text Page per Amion  If 7PM-7AM, please contact night-coverage per  Amion 02/05/2021, 8:03 AM

## 2021-02-05 NOTE — Procedures (Signed)
° °  HEMODIALYSIS TREATMENT NOTE:   Noted restlessness the entire treatment from generalized skin irritation and c/o left leg pain.  He stated at the beginning of the treatment that he "wouldn't be able to run the whole 4 hours" and he elected to end the session after 2.25 hours.  Net UF 1 liter.  Dr. Joylene Grapes was notified via secure chat.   Rockwell Alexandria, RN

## 2021-02-05 NOTE — Consult Note (Addendum)
[place] for Infectious Disease       Reason for Consult: Staph aureus bacteremia    Referring Physician: CHAMP autoconsult  Principal Problem:   Lower extremity cellulitis Active Problems:   ESRD on hemodialysis (HCC)   Macrocytic anemia   Chronic systolic CHF (congestive heart failure) (HCC)    apixaban  5 mg Oral BID   atorvastatin  10 mg Oral QPM   calcium acetate  667 mg Oral TID WC   calcium carbonate  800 mg of elemental calcium Oral BID   carvedilol  3.125 mg Oral BID WC   Chlorhexidine Gluconate Cloth  6 each Topical Q0600   cholecalciferol  1,000 Units Oral Q M,W,F-HD   insulin aspart  0-6 Units Subcutaneous TID WC   pantoprazole  40 mg Oral Daily    Recommendations: Vancomycin with dialysis TTE Repeat cultures 6 weeks due to presence of AVF if repeat cultures remain negative I do not feel TEE absolutely indicated otherwise unless there are concerns on the TTE  Assessment: MRSA in 2/2 blood cultures of one set, no growth in the other set in the setting of a presumed leg infection.     Please call or message with other questions/concerns.   Antibiotics: cefazolin  HPI: Brendan Campbell is a 50 y.o. male with ESRD with fistula, no reported line with worsening foot pain, concern for infection.     Past Medical History:  Diagnosis Date   Anemia    Anxiety    Blood transfusion without reported diagnosis    CHF (congestive heart failure) (HCC)    a. EF 25% by echo in 07/2019   Chronic kidney disease    Depression    Diabetes mellitus without complication ([place])    End stage renal disease ([place])    M/W/F [facility]   Hyperlipidemia    Neuropathy    Peripheral vascular disease (HCC)    PTSD (post-traumatic stress disorder)     Social History   Tobacco Use   Smoking status: Some Days    Types: Cigarettes    Last attempt to quit: 09/03/2006    Years since quitting: 14.4   Smokeless tobacco: Never  Vaping Use   Vaping Use: Never used   Substance Use Topics   Alcohol use: No   Drug use: No    Family History  Problem Relation Age of Onset   Cancer Mother        lung   Diabetes Mother    Heart attack Father    Diabetes Father    Diabetes Sister     Allergies  Allergen Reactions   Tape Other (See Comments)    Pulls skin off    Physical Exam: Vitals:   02/04/21 1828 02/05/21 0456  BP: 107/80 (!) 174/74  Pulse: 87 (!) 59  Resp: 20 17  Temp:  98 F (36.7 C)  SpO2: 100% 97%     Lab Results  Component Value Date   WBC 8.8 02/05/2021   HGB 10.7 (L) 02/05/2021   HCT 33.4 (L) 02/05/2021   MCV 107.4 (H) 02/05/2021   PLT 187 02/05/2021    Lab Results  Component Value Date   CREATININE 7.34 (H) 02/05/2021   BUN 48 (H) 02/05/2021   NA 137 02/05/2021   K 4.8 02/05/2021   CL 98 02/05/2021   CO2 24 02/05/2021    Lab Results  Component Value Date   ALT 7 02/05/2021   AST 13 (L) 02/05/2021  [facility] 73 02/05/2021     Microbiology: Recent Results (from the past 240 hour(s))  Blood Culture (routine x 2)     Status: None (Preliminary result)   Collection Time: 02/04/21  1:22 AM   Specimen: Right Antecubital; Blood  Result Value Ref Range Status   Specimen Description   Final    RIGHT ANTECUBITAL Performed at [facility], [address] [place], [place] [address]    Special Requests   Final    BOTTLES DRAWN AEROBIC AND ANAEROBIC Blood Culture adequate volume Performed at [place] [place], [place] [address]    Culture  Setup Time   Final    GRAM POSITIVE COCCI IN BOTH AEROBIC AND ANAEROBIC BOTTLES Gram Stain Report Called to,Read Back By and Verified With: THOMAS, K@[id number]  BY MATTHEWS, B 12.30.22 CRITICAL RESULT CALLED TO, READ BACK BY AND VERIFIED WITH: S.HALL 02/05/21 1044 FH Performed at [facility] [address] [place], [place] [address]    Culture GRAM POSITIVE COCCI  Final   Report Status PENDING  Incomplete  Blood Culture (routine x 2)     Status: None  (Preliminary result)   Collection Time: 02/04/21  1:22 AM   Specimen: BLOOD RIGHT HAND  Result Value Ref Range Status   Specimen Description BLOOD RIGHT HAND  Final   Special Requests   Final    BOTTLES DRAWN AEROBIC AND ANAEROBIC Blood Culture adequate volume   Culture   Final    NO GROWTH 1 DAY Performed at [facility], [address], [place], [place] [address]    Report Status PENDING  Incomplete  Blood Culture ID Panel (Reflexed)     Status: Abnormal   Collection Time: 02/04/21  1:22 AM  Result Value Ref Range Status   Enterococcus faecalis NOT DETECTED NOT DETECTED Final   Enterococcus Faecium NOT DETECTED NOT DETECTED Final   Listeria monocytogenes NOT DETECTED NOT DETECTED Final   Staphylococcus species DETECTED (A) NOT DETECTED Final    Comment: CRITICAL RESULT CALLED TO, READ BACK BY AND VERIFIED WITH: S.HALL 02/05/21 1044 FH    Staphylococcus aureus (BCID) DETECTED (A) NOT DETECTED Final    Comment: Methicillin (oxacillin)-resistant Staphylococcus aureus (MRSA). MRSA is predictably resistant to beta-lactam antibiotics (except ceftaroline). Preferred therapy is vancomycin unless clinically contraindicated. Patient requires contact precautions if  hospitalized. CRITICAL RESULT CALLED TO, READ BACK BY AND VERIFIED WITH: S.HALL 02/05/21 1044 FH    Staphylococcus epidermidis NOT DETECTED NOT DETECTED Final   Staphylococcus lugdunensis NOT DETECTED NOT DETECTED Final   Streptococcus species NOT DETECTED NOT DETECTED Final   Streptococcus agalactiae NOT DETECTED NOT DETECTED Final   Streptococcus pneumoniae NOT DETECTED NOT DETECTED Final   Streptococcus pyogenes NOT DETECTED NOT DETECTED Final   A.calcoaceticus-baumannii NOT DETECTED NOT DETECTED Final   Bacteroides fragilis NOT DETECTED NOT DETECTED Final   Enterobacterales NOT DETECTED NOT DETECTED Final   Enterobacter cloacae complex NOT DETECTED NOT DETECTED Final   Escherichia coli NOT DETECTED NOT DETECTED Final    Klebsiella aerogenes NOT DETECTED NOT DETECTED Final   Klebsiella oxytoca NOT DETECTED NOT DETECTED Final   Klebsiella pneumoniae NOT DETECTED NOT DETECTED Final   Proteus species NOT DETECTED NOT DETECTED Final   Salmonella species NOT DETECTED NOT DETECTED Final   Serratia marcescens NOT DETECTED NOT DETECTED Final   Haemophilus influenzae NOT DETECTED NOT DETECTED Final   Neisseria meningitidis NOT DETECTED NOT DETECTED Final   Pseudomonas aeruginosa NOT DETECTED NOT DETECTED Final   Stenotrophomonas  maltophilia NOT DETECTED NOT DETECTED Final   Candida albicans NOT DETECTED NOT DETECTED Final   Candida auris NOT DETECTED NOT DETECTED Final   Candida glabrata NOT DETECTED NOT DETECTED Final   Candida krusei NOT DETECTED NOT DETECTED Final   Candida parapsilosis NOT DETECTED NOT DETECTED Final   Candida tropicalis NOT DETECTED NOT DETECTED Final   Cryptococcus neoformans/gattii NOT DETECTED NOT DETECTED Final   Meth resistant mecA/C and MREJ DETECTED (A) NOT DETECTED Final    Comment: CRITICAL RESULT CALLED TO, READ BACK BY AND VERIFIED WITH: S.HALL 02/05/21 1044 FH Performed at [facility] [address] [place], [place] [address]   Resp Panel by RT-PCR (Flu A&B, Covid) Nasopharyngeal Swab     Status: None   Collection Time: 02/04/21  2:53 AM   Specimen: Nasopharyngeal Swab; Nasopharyngeal(NP) swabs in vial transport medium  Result Value Ref Range Status   SARS Coronavirus 2 by RT PCR NEGATIVE NEGATIVE Final    Comment: (NOTE) SARS-CoV-2 target nucleic acids are NOT DETECTED.  The SARS-CoV-2 RNA is generally detectable in upper respiratory specimens during the acute phase of infection. The lowest concentration of SARS-CoV-2 viral copies this assay can detect is 138 copies/mL. A negative result does not preclude SARS-Cov-2 infection and should not be used as the sole basis for treatment or other patient management decisions. A negative result may occur with  improper  specimen collection/handling, submission of specimen other than nasopharyngeal swab, presence of viral mutation(s) within the areas targeted by this assay, and inadequate number of viral copies(<138 copies/mL). A negative result must be combined with clinical observations, patient history, and epidemiological information. The expected result is Negative.  Fact Sheet for Patients:  EntrepreneurPulse.com.au  Fact Sheet for Healthcare Providers:  IncredibleEmployment.be  This test is no t yet approved or cleared by the [place] FDA and  has been authorized for detection and/or diagnosis of SARS-CoV-2 by FDA under an Emergency Use Authorization (EUA). This EUA will remain  in effect (meaning this test can be used) for the duration of the COVID-19 declaration under Section 564(b)(1) of the Act, 21 U.S.C.section 360bbb-3(b)(1), unless the authorization is terminated  or revoked sooner.       Influenza A by PCR NEGATIVE NEGATIVE Final   Influenza B by PCR NEGATIVE NEGATIVE Final    Comment: (NOTE) The Xpert Xpress SARS-CoV-2/FLU/RSV plus assay is intended as an aid in the diagnosis of influenza from Nasopharyngeal swab specimens and should not be used as a sole basis for treatment. Nasal washings and aspirates are unacceptable for Xpert Xpress SARS-CoV-2/FLU/RSV testing.  Fact Sheet for Patients: EntrepreneurPulse.com.au  Fact Sheet for Healthcare Providers: IncredibleEmployment.be  This test is not yet approved or cleared by the [place] FDA and has been authorized for detection and/or diagnosis of SARS-CoV-2 by FDA under an Emergency Use Authorization (EUA). This EUA will remain in effect (meaning this test can be used) for the duration of the COVID-19 declaration under Section 564(b)(1) of the Act, 21 U.S.C. section 360bbb-3(b)(1), unless the authorization is terminated or revoked.  Performed at  [facility], [address] [place], [place] [address]     Tajha Sammarco W [facility] [place], [place] for Infectious Disease [place] [email or web address] 02/05/2021, 11:31 AM

## 2021-02-05 NOTE — Progress Notes (Signed)
[facility] NEPHROLOGY PROGRESS NOTE  Assessment/ Plan: Pt is a 50 y.o. yo male  with history of DM, HLD, anxiety depression, CHF, PVD, peripheral neuropathy, ESRD on HD MWF at [facility] presents with left leg cellulitis, seen as a consultation for the management of ESRD.  Dialysis Order from previous admission in 08/2020:  [name]  on MWF  4h 50min  98 kg   1K/2.25 bath  Hep 1000+ 500u/hr  LUE AVG  300/500   - prosthetic weighs 1.6 kg  - Epogen 1000   Units IV/HD    # Left leg cellulitis: Received vancomycin and cefepime in the ER and now on ceftriaxone.  Patient with peripheral neuropathy and peripheral vascular disease.  Primary team is managing.   # ESRD: MWF: Missed HD on 12/28 because of severe pain.  Plan for regular dialysis today.  Left upper extremities AV graft for the access.  Try to obtain outpatient record.   # Hypertension: Blood pressure and volume status acceptable.  On carvedilol.  UF with HD.   # Anemia of ESRD: Hemoglobin at goal.  Continue to monitor.   # Metabolic Bone Disease: Continue PhosLo for the management of hyperphosphatemia.  Monitor lab.  Please call nephrology team with any question over the weekend.  Subjective: Seen and examined at bedside.  Denies nausea, vomiting, chest pain, shortness of breath.  No new event. Objective Vital signs in last 24 hours: Vitals:   02/04/21 1434 02/04/21 1445 02/04/21 1828 02/05/21 0456  BP: 103/87 110/78 107/80 (!) 174/74  Pulse: 88 87 87 (!) 59  Resp: 18 17 20 17   Temp: (!) 97.4 F (36.3 C) (!) 97.4 F (36.3 C)  98 F (36.7 C)  TempSrc: Oral Oral    SpO2: 98% 100% 100% 97%  Weight:  93.3 kg    Height:  5\' 9"  (1.753 m)     Weight change: -8.759 kg  Intake/Output Summary (Last 24 hours) at 02/05/2021 0903 Last data filed at 02/04/2021 1700 Gross per 24 hour  Intake 695 ml  Output --  Net 695 ml       Labs: Basic Metabolic Panel: Recent Labs  Lab 02/04/21 0039 02/04/21 0446  02/05/21 0518  NA 137  --  137  K 4.1  --  4.8  CL 97*  --  98  CO2 25  --  24  GLUCOSE 79  --  74  BUN 39*  --  48*  CREATININE 6.59*  --  7.34*  CALCIUM 8.1*  --  8.5*  PHOS  --  6.2*  --    Liver Function Tests: Recent Labs  Lab 02/04/21 0039 02/05/21 0518  AST 16 13*  ALT 7 7  ALKPHOS 83 73  BILITOT 0.7 0.7  PROT 7.5 6.9  ALBUMIN 3.7 3.3*   No results for input(s): LIPASE, AMYLASE in the last 168 hours. No results for input(s): AMMONIA in the last 168 hours. CBC: Recent Labs  Lab 02/04/21 0039 02/05/21 0518  WBC 8.1 8.8  NEUTROABS 6.1  --   HGB 11.8* 10.7*  HCT 36.8* 33.4*  MCV 106.7* 107.4*  PLT 215 187   Cardiac Enzymes: No results for input(s): CKTOTAL, CKMB, CKMBINDEX, TROPONINI in the last 168 hours. CBG: Recent Labs  Lab 02/04/21 1333 02/04/21 1630 02/04/21 2141 02/05/21 0729  GLUCAP 83 79 85 73    Iron Studies: No results for input(s): IRON, TIBC, TRANSFERRIN, FERRITIN in the last 72 hours. Studies/Results: DG Chest Port 1 [address]  Result Date: 02/04/2021 CLINICAL DATA:  Left foot pain x3 weeks with chills and rash. EXAM: PORTABLE CHEST 1 VIEW COMPARISON:  November 26, 2020 FINDINGS: Mild, diffuse, chronic appearing increased interstitial lung markings are seen. This is unchanged in severity when compared to the prior study. There is no evidence of acute infiltrate, pleural effusion or pneumothorax. The cardiac silhouette is moderately enlarged and unchanged in size. The visualized skeletal structures are unremarkable. IMPRESSION: Stable cardiomegaly without evidence of acute or active cardiopulmonary disease. Electronically Signed   By: Virgina Norfolk M.D.   On: 02/04/2021 01:24   DG Foot Complete Left  Result Date: 02/04/2021 CLINICAL DATA:  Left foot pain x3 weeks with chills and rash. EXAM: LEFT FOOT - COMPLETE 3+ VIEW COMPARISON:  December 07, 2020 FINDINGS: Surgical amputation of the first and second left toes is noted with stable chronic and  postoperative changes seen involving the third left toe. There is no evidence of an fracture or dislocation. No areas of cortical erosion or acute periosteal reaction are seen. Mild degenerative changes are noted along the dorsal aspect of the mid left foot. Loss of the plantar arch is noted. There is mild diffuse soft tissue swelling which is more prominent along the plantar aspect of the mid left foot. IMPRESSION: 1. Stable chronic and postoperative changes, without evidence of acute osseous abnormality. 2. Plantar soft tissue swelling, as described above, without evidence of acute osteomyelitis. Electronically Signed   By: Virgina Norfolk M.D.   On: 02/04/2021 01:30    Medications: Infusions:  cefTRIAXone (ROCEPHIN)  IV 2 g (02/05/21 0824)    Scheduled Medications:  apixaban  5 mg Oral BID   atorvastatin  10 mg Oral QPM   calcium acetate  667 mg Oral TID WC   calcium carbonate  800 mg of elemental calcium Oral BID   carvedilol  3.125 mg Oral BID WC   Chlorhexidine Gluconate Cloth  6 each Topical Q0600   cholecalciferol  1,000 Units Oral Q M,W,F-HD   insulin aspart  0-6 Units Subcutaneous TID WC   pantoprazole  40 mg Oral Daily    have reviewed scheduled and prn medications.  Physical Exam: General:NAD, comfortable Heart:RRR, s1s2 nl Lungs:clear b/l, no crackle Abdomen:soft, Non-tender, non-distended Extremities:No edema Dialysis Access: Left upper extremity AV graft has good thrill and bruit  Ananda Caya Prasad Romelle Reiley 02/05/2021,9:03 AM  LOS: 1 day

## 2021-02-05 NOTE — Progress Notes (Addendum)
PHARMACY - PHYSICIAN COMMUNICATION CRITICAL VALUE ALERT - BLOOD CULTURE IDENTIFICATION (BCID)  Brendan Campbell is an 50 y.o. male who presented to [name] on 02/03/2021   Assessment:  staph species/staph aureus mecA positive 1/4 bottles-    Current antibiotics: Cefazolin 2000 mg IV every MWF w/ HD  Changes to prescribed antibiotics recommended:  Change to vanco  No results found for this or any previous visit.  [name] 02/05/2021  10:45 AM

## 2021-02-05 NOTE — Progress Notes (Signed)
Attending requests wc and orders placed. Discussed DME providers. Wc ordered via Caryl Pina at [place]. Patient will also need assistance with transportation at d/c. Attending notified that transport for cab stops at 6 and [facility] transport stops at 6. Patient to  possibly d/c today after echo.   Posey Petrik, Clydene Pugh, LCSW

## 2021-02-05 NOTE — TOC Initial Note (Signed)
Transition of Care [facility]) - Initial/Assessment Note    Patient Details  Name: Brendan Campbell MRN: [id number] Date of Birth: 04-22-1970  Transition of Care [facility]) CM/SW Contact:    Ihor Gully, LCSW Phone Number: 02/05/2021, 2:57 PM  Clinical Narrative:                 Patient from home with roommate. Admitted with LE cellulitis. Attending requests wc and orders placed. Discussed DME providers. Wc ordered via Caryl Pina at [place]. Patient will also need assistance with transportation at d/c. Attending notified that transport for cab stops at 6 and [facility] transport stops at 6. Patient to  possibly d/c today after echo  Expected Discharge Plan: Home/Self Care Barriers to Discharge: Continued Medical Work up   Patient Goals and CMS Choice        Expected Discharge Plan and Services Expected Discharge Plan: Home/Self Care                                              Prior Living Arrangements/Services                       Activities of Daily Living Home Assistive Devices/Equipment: Prosthesis, Wheelchair, Bedside commode/3-in-1 ADL Screening (condition at time of admission) Patient's cognitive ability adequate to safely complete daily activities?: Yes Is the patient deaf or have difficulty hearing?: No Does the patient have difficulty seeing, even when wearing glasses/contacts?: No Does the patient have difficulty concentrating, remembering, or making decisions?: No Patient able to express need for assistance with ADLs?: Yes Does the patient have difficulty dressing or bathing?: Yes Independently performs ADLs?: Yes (appropriate for developmental age) Does the patient have difficulty walking or climbing stairs?: Yes Weakness of Legs: Both Weakness of Arms/Hands: None  Permission Sought/Granted                  Emotional Assessment       Orientation: : Oriented to Self, Oriented to Place, Oriented to  Time, Oriented to Situation Alcohol / Substance  Use: Not Applicable    Admission diagnosis:  ESRD (end stage renal disease) ([place]) [N18.6] Cellulitis of left foot [L03.116] Lower extremity cellulitis [L03.119] Patient Active Problem List   Diagnosis Date Noted   Lower extremity cellulitis 28/00/3491   Chronic systolic CHF (congestive heart failure) ([place]) 07/27/2020   Volume overload 07/20/2020   COVID-19 virus infection 07/20/2020   Acute on chronic respiratory failure with hypoxia ([place]) 07/03/2020   Acute on chronic combined systolic and diastolic CHF (congestive heart failure) ([place]) 04/08/2020   Left hemiparesis ([place]) 04/08/2020   Lumbar spondylosis 04/08/2020   Atrial fibrillation with RVR ([place]) 04/08/2020   Elevated troponin 04/08/2020   Pain in limb 12/29/2019   Headache disorder 12/29/2019   Pain in left arm 12/29/2019   Cervical radiculopathy 12/29/2019   Acute congestive heart failure ([place]) 12/13/2019   Macrocytic anemia 09/25/2019   Paresthesia 09/24/2019   Hypocalcemia    Chronic atrial fibrillation ([place])    Hyperparathyroidism ([place])    Cardiac arrest ([place]) 06/07/2019   Neuropathy    Peripheral vascular disease ([place])    Prolonged QT interval    Leukocytosis    Atypical pneumonia    Acute pulmonary edema ([place])    ESRD on hemodialysis ([place])    Hyperkalemia 10/05/2018   Weakness 10/05/2018   ESRD (end  stage renal disease) ([place]) 05/10/2016   Spondylosis of lumbar spine 03/08/2016   Lumbar radiculopathy 03/08/2016   S/P below knee amputation, right ([place]) 03/05/2015   Type 2 diabetes mellitus with diabetic chronic kidney disease ([place]) 03/03/2015   Hyperlipidemia 03/03/2015   Low back pain 03/03/2015   PCP:  Monico Blitz, MD Pharmacy:   [id number] [place], [address] [place] [address] Phone: [phone] Fax: [phone]     Social Determinants of Health (SDOH) Interventions    Readmission Risk Interventions Readmission Risk Prevention Plan  07/21/2020  Transportation Screening Complete  PCP or Specialist Appt within 3-5 Days Complete  HRI or [place] Complete  Social Work Consult for [place] Planning/Counseling Complete  Palliative Care Screening Not Applicable  Medication Review Press photographer) Complete  Some recent data might be hidden

## 2021-02-05 NOTE — Progress Notes (Signed)
Pharmacy Antibiotic Note  Brendan Campbell is a 50 y.o. male admitted on 02/03/2021 with bacteremia.  Pharmacy has been consulted for vancomycin dosing.  Plan: Vancomycin 2000 mg IV x 1 dose. Vancomycin 1000 mg IV every 24 hours. Monitor labs, c/s, and vanco level as indicated.  Height: 5\' 9"  (175.3 cm) Weight: 93.4 kg (205 lb 14.6 oz) IBW/kg (Calculated) : 70.7  Temp (24hrs), Avg:98.1 F (36.7 C), Min:98 F (36.7 C), Max:98.1 F (36.7 C)  Recent Labs  Lab 02/04/21 0039 02/04/21 0122 02/04/21 0253 02/05/21 0518  WBC 8.1  --   --  8.8  CREATININE 6.59*  --   --  7.34*  LATICACIDVEN  --  1.3 1.3  --     Estimated Creatinine Clearance: 13.7 mL/min (A) (by C-G formula based on SCr of 7.34 mg/dL (H)).    Allergies  Allergen Reactions   Tape Other (See Comments)    Pulls skin off    Antimicrobials this admission: Vanco 12/30 >> CTX 12/29 >> 12/30  Microbiology results: 12/29 BCx: gram + cocci BCID: staph aureua mecA (+)   Thank you for allowing pharmacy to be a part of this patients care.  [name] 02/05/2021 2:50 PM

## 2021-02-06 ENCOUNTER — Inpatient Hospital Stay (HOSPITAL_COMMUNITY): Payer: Medicaid Other

## 2021-02-06 DIAGNOSIS — I38 Endocarditis, valve unspecified: Secondary | ICD-10-CM

## 2021-02-06 LAB — ECHOCARDIOGRAM COMPLETE
Calc EF: 34.3 %
Height: 69 in
MV M vel: 4.07 m/s
MV Peak grad: 66.3 mmHg
Radius: 0.2 cm
S' Lateral: 4.2 cm
Single Plane A2C EF: 35.7 %
Single Plane A4C EF: 32 %
Weight: 3294.55 oz

## 2021-02-06 LAB — GLUCOSE, CAPILLARY
Glucose-Capillary: 65 mg/dL — ABNORMAL LOW (ref 70–99)
Glucose-Capillary: 71 mg/dL (ref 70–99)
Glucose-Capillary: 73 mg/dL (ref 70–99)
Glucose-Capillary: 80 mg/dL (ref 70–99)
Glucose-Capillary: 86 mg/dL (ref 70–99)

## 2021-02-06 MED ORDER — ACETAMINOPHEN 325 MG PO TABS: 650.0000 mg | ORAL_TABLET | Freq: Four times a day (QID) | ORAL | Status: AC | PRN

## 2021-02-06 MED ORDER — VANCOMYCIN HCL IN DEXTROSE 1-5 GM/200ML-% IV SOLN: 1000.0000 mg | INTRAVENOUS | 0 refills | Status: DC

## 2021-02-06 MED ORDER — OXYCODONE HCL 5 MG PO TABS: 5.0000 mg | ORAL_TABLET | Freq: Four times a day (QID) | ORAL | 0 refills | Status: DC | PRN

## 2021-02-06 NOTE — Progress Notes (Signed)
Discharge papers given IV removed. Pt will not have a ride until after 1600. Awaiting ride currently.

## 2021-02-06 NOTE — Discharge Summary (Signed)
DISCHARGE SUMMARY  [name]  MR#: [id number]  DOB:10/07/1970  Date of Admission: 02/03/2021 Date of Discharge: 02/06/2021  Attending Physician:Danniela Mcbrearty Hennie Duos, MD  Patient's VOZ:Brendan Campbell, Brendan Picking, MD  Consults: Nephrology   Disposition: D/C home    Follow-up Appts:  Follow-up Information     Monico Blitz, MD Follow up in 1 week(s).   Specialty: Internal Medicine Contact information: [place] [address] [phone]                 Tests Needing Follow-up: -results of TTE to r/o gross vegetation are pending at the time of D/C  -serial L LE exam indicated to monitor improvement in cellulitis  -assure pt tolerating and receiving Vancomycin therapy with each HD treatment - if cutaneous rash of back persists an outpatient Dermatology referral for biopsy to aid in diagnosis would be appropriate   Discharge Diagnoses: Left leg cellulitis 1 of 2 blood cx + MRSA ESRD on HD MWF Diffuse rash of back Macrocytic anemia Chronic systolic CHF Chronic atrial fibrillation DM2 Hyperlipidemia   Initial presentation: 50yo with a history of ESRD on HD MWF, chronic anemia, anxiety, depression, PTSD, chronic systolic CHF, chronic hypoxic respiratory failure on 2 L/min nasal cannula support, DM2, HLD, diabetic peripheral neuropathy, and PVD status post right BKA who presented to the ED with 2-3 weeks of progressive left foot pain with redness and swelling.  Hospital Course:  Left leg cellulitis X-rays without evidence of osteomyelitis - improved clinically w/ IV abx tx - abx adjusted given findings on blood cx as discussed below   1 of 2 blood cx + MRSA Changed abx to Vancomycin - ID consulted and suggested 6 weeks of empiric tx (with HD) and TTE - TTE ordered and currently pending - no M on exam and otherwise stable for d/c - repeat blood cultures to be obtained prior to d/c home - if remains clinically stable, and TTE w/o evidence of vegitation, vancomycin can be  discontinued after 6 weeks of tx and patient monitored w/ repeat blood cultures 1 week after discontinuation of abx    ESRD on HD MWF Nephrology attended to his HD during this admission    Diffuse rash of back Patient states this dates all the way back to October 2022 following a hospital admission at [facility] - etiology not clear on simple visual inspection - if persists an outpatient Dermatology referral for biopsy to aid in diagnosis would be appropriate    Macrocytic anemia I43 and folic acid are not low    Chronic systolic CHF TTE March 3295 noted EF 20-25% - volume control per HD - no signif overload at this time/well compensated - f/u TTE pending (to r/o SBE)    Chronic atrial fibrillation Continue Coreg and Eliquis   DM2 CBG well controlled    Hyperlipidemia Continue Lipitor    Allergies as of 02/06/2021       Reactions   Tape Other (See Comments)   Pulls skin off        Medication List     TAKE these medications    acetaminophen 325 MG tablet Commonly known as: TYLENOL Take 2 tablets (650 mg total) by mouth every 6 (six) hours as needed for mild pain, moderate pain or fever.   albuterol 108 (90 Base) MCG/ACT inhaler Commonly known as: VENTOLIN HFA Inhale 2 puffs into the lungs every 4 (four) hours as needed for wheezing or shortness of breath.   apixaban 5 MG Tabs tablet Commonly known  asArne [place] Take 1 tablet (5 mg total) by mouth 2 (two) times daily.   aspirin 81 MG chewable tablet Chew 81 mg by mouth daily.   atorvastatin 10 MG tablet Commonly known as: LIPITOR Take 1 tablet (10 mg total) by mouth every evening. What changed: additional instructions   butalbital-acetaminophen-caffeine 50-325-40 MG tablet Commonly known as: FIORICET Take 1 tablet by mouth every 6 (six) hours as needed for headache or migraine.   calcium acetate 667 MG capsule Commonly known as: PHOSLO Take 1 capsule (667 mg total) by mouth 3 (three) times daily with meals.    calcium carbonate 500 MG chewable tablet Commonly known as: TUMS - dosed in mg elemental calcium Chew 4 tablets (800 mg of elemental calcium total) by mouth 2 (two) times daily.   carvedilol 3.125 MG tablet Commonly known as: Coreg Take 1 tablet (3.125 mg total) by mouth 2 (two) times daily with a meal.   oxyCODONE 5 MG immediate release tablet Commonly known as: Oxy IR/ROXICODONE Take 1 tablet (5 mg total) by mouth every 6 (six) hours as needed for severe pain.   pantoprazole 20 MG tablet Commonly known as: PROTONIX Take 1 tablet (20 mg total) by mouth daily.   vancomycin 1-5 GM/200ML-% Soln Commonly known as: VANCOCIN Inject 200 mLs (1,000 mg total) into the vein every Monday, Wednesday, and Friday with hemodialysis. Start taking on: February 08, 2021   Veltassa 8.4 g packet Generic drug: patiromer Take 1 packet (8.4 g total) by mouth 3 (three) times a week. On Tuesday, Thursday and Saturday   Vitamin D3 25 MCG (1000 UT) Caps Take 1 capsule by mouth See admin instructions. Take before Dialysis on Monday Wednesday and Fridays               Durable Medical Equipment  (From admission, onward)           Start     Ordered   02/05/21 1357  For home use only DME lightweight manual wheelchair with seat cushion  Once       Comments: Patient suffers from B LE amputations which impairs their ability to perform daily activities like toileting in the home.  A walker will not resolve  issue with performing activities of daily living. A wheelchair will allow patient to safely perform daily activities. Patient is not able to propel themselves in the home using a standard weight wheelchair due to general weakness. Patient can self propel in the lightweight wheelchair. Length of need 6 months . Accessories: elevating leg rests (ELRs), wheel locks, extensions and anti-tippers.   02/05/21 1357            Day of Discharge BP 126/90    Pulse 81    Temp 97.7 F (36.5 C)    Resp  20    Ht 5\' 9"  (1.753 m)    Wt 93.4 kg    SpO2 100%    BMI 30.41 kg/m   Physical Exam: General: No acute respiratory distress Lungs: Clear to auscultation bilaterally without wheezes  Cardiovascular: Regular rate and rhythm without murmur  Abdomen: Nontender, nondistended, soft, bowel sounds positive, no rebound Extremities: mild erythema of L forefoot persists, but is improved since admit - some serosanguinous drainage from L foot but no purulent d/c - R stump w/ dry wound w/o erythema or purulent d/c   Basic Metabolic Panel: Recent Labs  Lab 02/04/21 0039 02/04/21 0446 02/05/21 0518  NA 137  --  137  K 4.1  --  4.8  CL 97*  --  98  CO2 25  --  24  GLUCOSE 79  --  74  BUN 39*  --  48*  CREATININE 6.59*  --  7.34*  CALCIUM 8.1*  --  8.5*  MG  --  2.0  --   PHOS  --  6.2*  --     Liver Function Tests: Recent Labs  Lab 02/04/21 0039 02/05/21 0518  AST 16 13*  ALT 7 7  ALKPHOS 83 73  BILITOT 0.7 0.7  PROT 7.5 6.9  ALBUMIN 3.7 3.3*    Coags: Recent Labs  Lab 02/04/21 0039  INR 1.1    CBC: Recent Labs  Lab 02/04/21 0039 02/05/21 0518  WBC 8.1 8.8  NEUTROABS 6.1  --   HGB 11.8* 10.7*  HCT 36.8* 33.4*  MCV 106.7* 107.4*  PLT 215 187    CBG: Recent Labs  Lab 02/05/21 2116 02/05/21 2237 02/06/21 0001 02/06/21 0500 02/06/21 0741  GLUCAP 61* 64* 71 86 65*    Recent Results (from the past 240 hour(s))  Blood Culture (routine x 2)     Status: Abnormal (Preliminary result)   Collection Time: 02/04/21  1:22 AM   Specimen: Right Antecubital; Blood  Result Value Ref Range Status   Specimen Description   Final    RIGHT ANTECUBITAL Performed at [facility], [address], [place], [place] [address]    Special Requests   Final    BOTTLES DRAWN AEROBIC AND ANAEROBIC Blood Culture adequate volume Performed at [facility], [address], [place], [place] [address]    Culture  Setup Time   Final    GRAM POSITIVE COCCI IN BOTH AEROBIC AND ANAEROBIC  BOTTLES Gram Stain Report Called to,Read Back By and Verified With: THOMAS, [id number]  BY MATTHEWS, B 12.30.22 CRITICAL RESULT CALLED TO, READ BACK BY AND VERIFIED WITH: S.HALL 02/05/21 1044 FH    Culture (A)  Final    STAPHYLOCOCCUS AUREUS SUSCEPTIBILITIES TO FOLLOW Performed at [facility], [place] [address], [place], [place] [address]    Report Status PENDING  Incomplete  Blood Culture (routine x 2)     Status: None (Preliminary result)   Collection Time: 02/04/21  1:22 AM   Specimen: BLOOD RIGHT HAND  Result Value Ref Range Status   Specimen Description BLOOD RIGHT HAND  Final   Special Requests   Final    BOTTLES DRAWN AEROBIC AND ANAEROBIC Blood Culture adequate volume   Culture   Final    NO GROWTH 2 DAYS Performed at [facility], [address], [place], [place] [address]    Report Status PENDING  Incomplete  Blood Culture ID Panel (Reflexed)     Status: Abnormal   Collection Time: 02/04/21  1:22 AM  Result Value Ref Range Status   Enterococcus faecalis NOT DETECTED NOT DETECTED Final   Enterococcus Faecium NOT DETECTED NOT DETECTED Final   Listeria monocytogenes NOT DETECTED NOT DETECTED Final   Staphylococcus species DETECTED (A) NOT DETECTED Final    Comment: CRITICAL RESULT CALLED TO, READ BACK BY AND VERIFIED WITH: S.HALL 02/05/21 1044 FH    Staphylococcus aureus (BCID) DETECTED (A) NOT DETECTED Final    Comment: Methicillin (oxacillin)-resistant Staphylococcus aureus (MRSA). MRSA is predictably resistant to beta-lactam antibiotics (except ceftaroline). Preferred therapy is vancomycin unless clinically contraindicated. Patient requires contact precautions if  hospitalized. CRITICAL RESULT CALLED TO, READ BACK BY AND VERIFIED WITH: S.HALL 02/05/21 1044 FH    Staphylococcus epidermidis NOT DETECTED  NOT DETECTED Final   Staphylococcus lugdunensis NOT DETECTED NOT DETECTED Final   Streptococcus species NOT DETECTED NOT DETECTED Final   Streptococcus agalactiae NOT  DETECTED NOT DETECTED Final   Streptococcus pneumoniae NOT DETECTED NOT DETECTED Final   Streptococcus pyogenes NOT DETECTED NOT DETECTED Final   A.calcoaceticus-baumannii NOT DETECTED NOT DETECTED Final   Bacteroides fragilis NOT DETECTED NOT DETECTED Final   Enterobacterales NOT DETECTED NOT DETECTED Final   Enterobacter cloacae complex NOT DETECTED NOT DETECTED Final   Escherichia coli NOT DETECTED NOT DETECTED Final   Klebsiella aerogenes NOT DETECTED NOT DETECTED Final   Klebsiella oxytoca NOT DETECTED NOT DETECTED Final   Klebsiella pneumoniae NOT DETECTED NOT DETECTED Final   Proteus species NOT DETECTED NOT DETECTED Final   Salmonella species NOT DETECTED NOT DETECTED Final   Serratia marcescens NOT DETECTED NOT DETECTED Final   Haemophilus influenzae NOT DETECTED NOT DETECTED Final   Neisseria meningitidis NOT DETECTED NOT DETECTED Final   Pseudomonas aeruginosa NOT DETECTED NOT DETECTED Final   Stenotrophomonas maltophilia NOT DETECTED NOT DETECTED Final   Candida albicans NOT DETECTED NOT DETECTED Final   Candida auris NOT DETECTED NOT DETECTED Final   Candida glabrata NOT DETECTED NOT DETECTED Final   Candida krusei NOT DETECTED NOT DETECTED Final   Candida parapsilosis NOT DETECTED NOT DETECTED Final   Candida tropicalis NOT DETECTED NOT DETECTED Final   Cryptococcus neoformans/gattii NOT DETECTED NOT DETECTED Final   Meth resistant mecA/C and MREJ DETECTED (A) NOT DETECTED Final    Comment: CRITICAL RESULT CALLED TO, READ BACK BY AND VERIFIED WITH: S.HALL 02/05/21 1044 FH Performed at [place] [address], [place], [place] [address]   Resp Panel by RT-PCR (Flu A&B, Covid) Nasopharyngeal Swab     Status: None   Collection Time: 02/04/21  2:53 AM   Specimen: Nasopharyngeal Swab; Nasopharyngeal(NP) swabs in vial transport medium  Result Value Ref Range Status   SARS Coronavirus 2 by RT PCR NEGATIVE NEGATIVE Final    Comment: (NOTE) SARS-CoV-2 target  nucleic acids are NOT DETECTED.  The SARS-CoV-2 RNA is generally detectable in upper respiratory specimens during the acute phase of infection. The lowest concentration of SARS-CoV-2 viral copies this assay can detect is 138 copies/mL. A negative result does not preclude SARS-Cov-2 infection and should not be used as the sole basis for treatment or other patient management decisions. A negative result may occur with  improper specimen collection/handling, submission of specimen other than nasopharyngeal swab, presence of viral mutation(s) within the areas targeted by this assay, and inadequate number of viral copies(<138 copies/mL). A negative result must be combined with clinical observations, patient history, and epidemiological information. The expected result is Negative.  Fact Sheet for Patients:  EntrepreneurPulse.com.au  Fact Sheet for Healthcare Providers:  IncredibleEmployment.be  This test is no t yet approved or cleared by the [place] FDA and  has been authorized for detection and/or diagnosis of SARS-CoV-2 by FDA under an Emergency Use Authorization (EUA). This EUA will remain  in effect (meaning this test can be used) for the duration of the COVID-19 declaration under Section 564(b)(1) of the Act, 21 U.S.C.section 360bbb-3(b)(1), unless the authorization is terminated  or revoked sooner.       Influenza A by PCR NEGATIVE NEGATIVE Final   Influenza B by PCR NEGATIVE NEGATIVE Final    Comment: (NOTE) The Xpert Xpress SARS-CoV-2/FLU/RSV plus assay is intended as an aid in the diagnosis of influenza from Nasopharyngeal swab specimens and should  not be used as a sole basis for treatment. Nasal washings and aspirates are unacceptable for Xpert Xpress SARS-CoV-2/FLU/RSV testing.  Fact Sheet for Patients: EntrepreneurPulse.com.au  Fact Sheet for Healthcare  Providers: IncredibleEmployment.be  This test is not yet approved or cleared by the [place] FDA and has been authorized for detection and/or diagnosis of SARS-CoV-2 by FDA under an Emergency Use Authorization (EUA). This EUA will remain in effect (meaning this test can be used) for the duration of the COVID-19 declaration under Section 564(b)(1) of the Act, 21 U.S.C. section 360bbb-3(b)(1), unless the authorization is terminated or revoked.  Performed at [facility], [address] [place], [place] [address]      Time spent in discharge (includes decision making & examination of pt): 35 minutes  02/06/2021, 10:29 AM   Cherene Altes, MD [facility] Office  [phone]

## 2021-02-06 NOTE — TOC Transition Note (Signed)
Transition of Care [facility]) - CM/SW Discharge Note   Patient Details  Name: Brendan Campbell MRN: [id number] Date of Birth: 1971-02-04  [facility] of [facility] CM/SW Contact:  Boneta Lucks, RN Phone Number: 02/06/2021, 11:05 AM   Clinical Narrative:   Patient will discharge home needing 6 week of antibiotic with his dialysis on MWF. TOC called [name] to update them with discharge plan. They will look for discharge order and summary and added the needs for antibiotics to patients chart.   Final next level of care: Home/Self Care Barriers to Discharge: Barriers Resolved  Patient Goals and CMS Choice Patient states their goals for this hospitalization and ongoing recovery are:: to go home. CMS Medicare.gov Compare Post Acute Care list provided to:: Patient    Discharge Placement               Patient to be transferred to facility by: Friend   Patient and family notified of of transfer: 02/06/21  Discharge Plan and Services      Readmission Risk Interventions Readmission Risk Prevention Plan 07/21/2020  Transportation Screening Complete  PCP or Specialist Appt within 3-5 Days Complete  HRI or Home Care Consult Complete  Social Work Consult for [name] Planning/Counseling Complete  Palliative Care Screening Not Applicable  Medication Review Press photographer) Complete  Some recent data might be hidden

## 2021-02-06 NOTE — Progress Notes (Signed)
°  Echocardiogram 2D Echocardiogram has been performed.  Brendan Campbell 02/06/2021, 12:29 PM

## 2021-02-07 LAB — CULTURE, BLOOD (ROUTINE X 2): Special Requests: ADEQUATE

## 2021-02-09 LAB — CULTURE, BLOOD (ROUTINE X 2)
Culture: NO GROWTH
Special Requests: ADEQUATE

## 2021-02-11 LAB — CULTURE, BLOOD (ROUTINE X 2)
Culture: NO GROWTH
Culture: NO GROWTH
Special Requests: ADEQUATE
Special Requests: ADEQUATE

## 2021-02-12 ENCOUNTER — Inpatient Hospital Stay (HOSPITAL_COMMUNITY): Payer: Medicaid Other

## 2021-02-12 ENCOUNTER — Encounter (HOSPITAL_COMMUNITY): Payer: Self-pay | Admitting: Emergency Medicine

## 2021-02-12 ENCOUNTER — Other Ambulatory Visit: Payer: Self-pay

## 2021-02-12 ENCOUNTER — Inpatient Hospital Stay (HOSPITAL_COMMUNITY)
Admission: EM | Admit: 2021-02-12 | Discharge: 2021-02-19 | DRG: 239 | Disposition: A | Discharge status: Home Health | Payer: Medicaid Other | Attending: Internal Medicine | Admitting: Internal Medicine

## 2021-02-12 ENCOUNTER — Emergency Department (HOSPITAL_COMMUNITY): Payer: Medicaid Other

## 2021-02-12 DIAGNOSIS — Z89511 Acquired absence of right leg below knee: Secondary | ICD-10-CM

## 2021-02-12 DIAGNOSIS — M79609 Pain in unspecified limb: Secondary | ICD-10-CM | POA: Diagnosis present

## 2021-02-12 DIAGNOSIS — E872 Acidosis, unspecified: Secondary | ICD-10-CM | POA: Diagnosis present

## 2021-02-12 DIAGNOSIS — G8194 Hemiplegia, unspecified affecting left nondominant side: Secondary | ICD-10-CM | POA: Diagnosis present

## 2021-02-12 DIAGNOSIS — Z7901 Long term (current) use of anticoagulants: Secondary | ICD-10-CM | POA: Diagnosis not present

## 2021-02-12 DIAGNOSIS — I482 Chronic atrial fibrillation, unspecified: Secondary | ICD-10-CM | POA: Diagnosis present

## 2021-02-12 DIAGNOSIS — Z992 Dependence on renal dialysis: Secondary | ICD-10-CM

## 2021-02-12 DIAGNOSIS — E1122 Type 2 diabetes mellitus with diabetic chronic kidney disease: Secondary | ICD-10-CM | POA: Diagnosis present

## 2021-02-12 DIAGNOSIS — I70262 Atherosclerosis of native arteries of extremities with gangrene, left leg: Secondary | ICD-10-CM | POA: Diagnosis present

## 2021-02-12 DIAGNOSIS — F17210 Nicotine dependence, cigarettes, uncomplicated: Secondary | ICD-10-CM | POA: Diagnosis present

## 2021-02-12 DIAGNOSIS — I998 Other disorder of circulatory system: Secondary | ICD-10-CM

## 2021-02-12 DIAGNOSIS — L03119 Cellulitis of unspecified part of limb: Secondary | ICD-10-CM | POA: Diagnosis not present

## 2021-02-12 DIAGNOSIS — E669 Obesity, unspecified: Secondary | ICD-10-CM | POA: Diagnosis present

## 2021-02-12 DIAGNOSIS — Z833 Family history of diabetes mellitus: Secondary | ICD-10-CM

## 2021-02-12 DIAGNOSIS — Z89512 Acquired absence of left leg below knee: Secondary | ICD-10-CM

## 2021-02-12 DIAGNOSIS — N2581 Secondary hyperparathyroidism of renal origin: Secondary | ICD-10-CM | POA: Diagnosis present

## 2021-02-12 DIAGNOSIS — D631 Anemia in chronic kidney disease: Secondary | ICD-10-CM | POA: Diagnosis present

## 2021-02-12 DIAGNOSIS — Z8249 Family history of ischemic heart disease and other diseases of the circulatory system: Secondary | ICD-10-CM

## 2021-02-12 DIAGNOSIS — L03115 Cellulitis of right lower limb: Secondary | ICD-10-CM | POA: Diagnosis present

## 2021-02-12 DIAGNOSIS — L97528 Non-pressure chronic ulcer of other part of left foot with other specified severity: Secondary | ICD-10-CM | POA: Diagnosis present

## 2021-02-12 DIAGNOSIS — E875 Hyperkalemia: Secondary | ICD-10-CM | POA: Diagnosis present

## 2021-02-12 DIAGNOSIS — N186 End stage renal disease: Secondary | ICD-10-CM

## 2021-02-12 DIAGNOSIS — M898X9 Other specified disorders of bone, unspecified site: Secondary | ICD-10-CM | POA: Diagnosis present

## 2021-02-12 DIAGNOSIS — Z20822 Contact with and (suspected) exposure to covid-19: Secondary | ICD-10-CM | POA: Diagnosis present

## 2021-02-12 DIAGNOSIS — Z79899 Other long term (current) drug therapy: Secondary | ICD-10-CM

## 2021-02-12 DIAGNOSIS — I5043 Acute on chronic combined systolic (congestive) and diastolic (congestive) heart failure: Secondary | ICD-10-CM | POA: Diagnosis present

## 2021-02-12 DIAGNOSIS — E11621 Type 2 diabetes mellitus with foot ulcer: Secondary | ICD-10-CM | POA: Diagnosis present

## 2021-02-12 DIAGNOSIS — Z6833 Body mass index (BMI) 33.0-33.9, adult: Secondary | ICD-10-CM | POA: Diagnosis not present

## 2021-02-12 DIAGNOSIS — Z7982 Long term (current) use of aspirin: Secondary | ICD-10-CM

## 2021-02-12 DIAGNOSIS — E785 Hyperlipidemia, unspecified: Secondary | ICD-10-CM | POA: Diagnosis present

## 2021-02-12 DIAGNOSIS — E8770 Fluid overload, unspecified: Secondary | ICD-10-CM | POA: Diagnosis present

## 2021-02-12 DIAGNOSIS — L03116 Cellulitis of left lower limb: Secondary | ICD-10-CM | POA: Diagnosis present

## 2021-02-12 DIAGNOSIS — L281 Prurigo nodularis: Secondary | ICD-10-CM | POA: Diagnosis present

## 2021-02-12 DIAGNOSIS — Z792 Long term (current) use of antibiotics: Secondary | ICD-10-CM

## 2021-02-12 DIAGNOSIS — Z9115 Patient's noncompliance with renal dialysis: Secondary | ICD-10-CM

## 2021-02-12 DIAGNOSIS — E1152 Type 2 diabetes mellitus with diabetic peripheral angiopathy with gangrene: Secondary | ICD-10-CM | POA: Diagnosis present

## 2021-02-12 DIAGNOSIS — R0989 Other specified symptoms and signs involving the circulatory and respiratory systems: Secondary | ICD-10-CM

## 2021-02-12 DIAGNOSIS — I132 Hypertensive heart and chronic kidney disease with heart failure and with stage 5 chronic kidney disease, or end stage renal disease: Secondary | ICD-10-CM | POA: Diagnosis present

## 2021-02-12 DIAGNOSIS — R21 Rash and other nonspecific skin eruption: Secondary | ICD-10-CM | POA: Diagnosis present

## 2021-02-12 DIAGNOSIS — I739 Peripheral vascular disease, unspecified: Secondary | ICD-10-CM | POA: Diagnosis present

## 2021-02-12 DIAGNOSIS — B9562 Methicillin resistant Staphylococcus aureus infection as the cause of diseases classified elsewhere: Secondary | ICD-10-CM | POA: Diagnosis present

## 2021-02-12 DIAGNOSIS — Z801 Family history of malignant neoplasm of trachea, bronchus and lung: Secondary | ICD-10-CM

## 2021-02-12 DIAGNOSIS — Z8614 Personal history of Methicillin resistant Staphylococcus aureus infection: Secondary | ICD-10-CM

## 2021-02-12 DIAGNOSIS — R7881 Bacteremia: Secondary | ICD-10-CM

## 2021-02-12 LAB — RESP PANEL BY RT-PCR (FLU A&B, COVID) ARPGX2
Influenza A by PCR: NEGATIVE
Influenza B by PCR: NEGATIVE
SARS Coronavirus 2 by RT PCR: NEGATIVE

## 2021-02-12 LAB — LACTIC ACID, PLASMA
Lactic Acid, Venous: 2.3 mmol/L (ref 0.5–1.9)
Lactic Acid, Venous: 2.7 mmol/L (ref 0.5–1.9)

## 2021-02-12 LAB — CBC WITH DIFFERENTIAL/PLATELET
Abs Immature Granulocytes: 0.09 10*3/uL — ABNORMAL HIGH (ref 0.00–0.07)
Basophils Absolute: 0.1 10*3/uL (ref 0.0–0.1)
Basophils Relative: 1 %
Eosinophils Absolute: 0.6 10*3/uL — ABNORMAL HIGH (ref 0.0–0.5)
Eosinophils Relative: 7 %
HCT: 35.2 % — ABNORMAL LOW (ref 39.0–52.0)
Hemoglobin: 11.3 g/dL — ABNORMAL LOW (ref 13.0–17.0)
Immature Granulocytes: 1 %
Lymphocytes Relative: 16 %
Lymphs Abs: 1.3 10*3/uL (ref 0.7–4.0)
MCH: 34.8 pg — ABNORMAL HIGH (ref 26.0–34.0)
MCHC: 32.1 g/dL (ref 30.0–36.0)
MCV: 108.3 fL — ABNORMAL HIGH (ref 80.0–100.0)
Monocytes Absolute: 0.6 10*3/uL (ref 0.1–1.0)
Monocytes Relative: 7 %
Neutro Abs: 5.7 10*3/uL (ref 1.7–7.7)
Neutrophils Relative %: 68 %
Platelets: 233 10*3/uL (ref 150–400)
RBC: 3.25 MIL/uL — ABNORMAL LOW (ref 4.22–5.81)
RDW: 18.5 % — ABNORMAL HIGH (ref 11.5–15.5)
WBC: 8.3 10*3/uL (ref 4.0–10.5)
nRBC: 0 % (ref 0.0–0.2)

## 2021-02-12 LAB — COMPREHENSIVE METABOLIC PANEL
ALT: 12 U/L (ref 0–44)
AST: 17 U/L (ref 15–41)
Albumin: 3.3 g/dL — ABNORMAL LOW (ref 3.5–5.0)
Alkaline Phosphatase: 83 U/L (ref 38–126)
Anion gap: 18 — ABNORMAL HIGH (ref 5–15)
BUN: 76 mg/dL — ABNORMAL HIGH (ref 6–20)
CO2: 22 mmol/L (ref 22–32)
Calcium: 8.9 mg/dL (ref 8.9–10.3)
Chloride: 95 mmol/L — ABNORMAL LOW (ref 98–111)
Creatinine, Ser: 8.89 mg/dL — ABNORMAL HIGH (ref 0.61–1.24)
GFR, Estimated: 7 mL/min — ABNORMAL LOW (ref >60)
Glucose, Bld: 92 mg/dL (ref 70–99)
Potassium: >7.5 mmol/L (ref 3.5–5.1)
Sodium: 135 mmol/L (ref 135–145)
Total Bilirubin: 1 mg/dL (ref 0.3–1.2)
Total Protein: 7.1 g/dL (ref 6.5–8.1)

## 2021-02-12 LAB — MAGNESIUM: Magnesium: 2.3 mg/dL (ref 1.7–2.4)

## 2021-02-12 LAB — GLUCOSE, CAPILLARY: Glucose-Capillary: 101 mg/dL — ABNORMAL HIGH (ref 70–99)

## 2021-02-12 LAB — APTT
aPTT: 35 seconds (ref 24–36)
aPTT: 89 seconds — ABNORMAL HIGH (ref 24–36)

## 2021-02-12 LAB — PROTIME-INR
INR: 1.4 — ABNORMAL HIGH (ref 0.8–1.2)
Prothrombin Time: 16.9 seconds — ABNORMAL HIGH (ref 11.4–15.2)

## 2021-02-12 LAB — BRAIN NATRIURETIC PEPTIDE: B Natriuretic Peptide: >4500 pg/mL — ABNORMAL HIGH (ref 0.0–100.0)

## 2021-02-12 LAB — HEPARIN LEVEL (UNFRACTIONATED): Heparin Unfractionated: <0.1 IU/mL — ABNORMAL LOW (ref 0.30–0.70)

## 2021-02-12 IMAGING — CT CT ANGIO AOBIFEM WO/W CM
2 of 5 series · 8 of 46 positions shown, 9 images · IV contrast (Omnipaque or Isovue)
Comparison: None.

CLINICAL DATA: Left foot pain for the past 3 weeks. Concern for
ischemia affecting the left foot.

EXAM:
CT ANGIOGRAPHY OF ABDOMINAL AORTA WITH ILIOFEMORAL RUNOFF
TECHNIQUE: Multidetector CT imaging of the abdomen, pelvis and lower
extremities was performed using the standard protocol during bolus
administration of intravenous contrast. Multiplanar CT image
reconstructions and MIPs were obtained to evaluate the vascular
anatomy.
CONTRAST:  125mL OMNIPAQUE IOHEXOL 350 MG/ML SOLN

[Series 5: arterial · axial · arterial · 0.98mm/px · z∈[+136,+1284]mm · 5 of 495 slices shown, 6 images]
[im 64/495  soft-tissue]
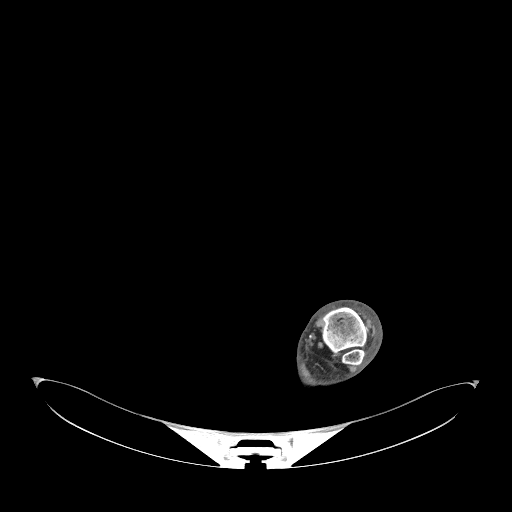
[im 64/495  bone]
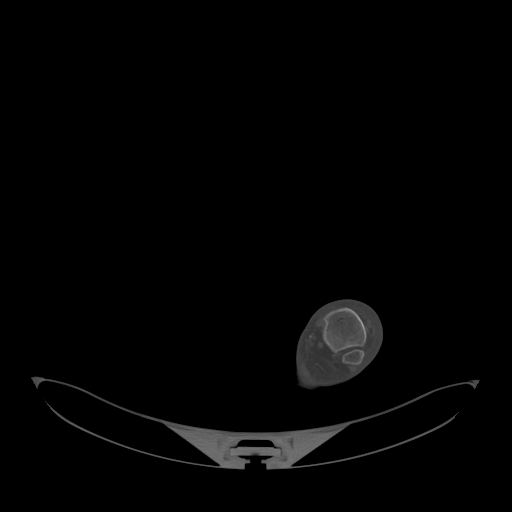
[im 160/495  soft-tissue]
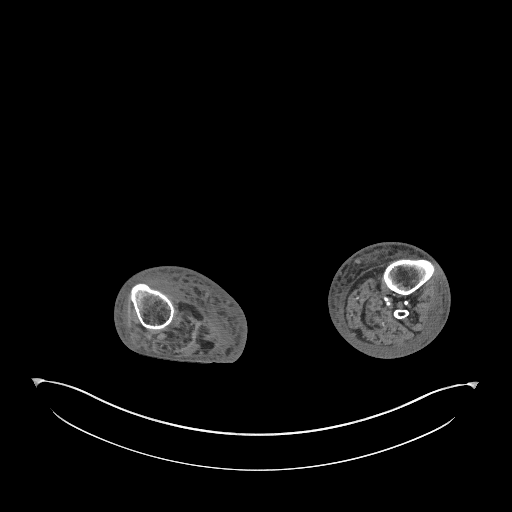
[im 255/495  soft-tissue]
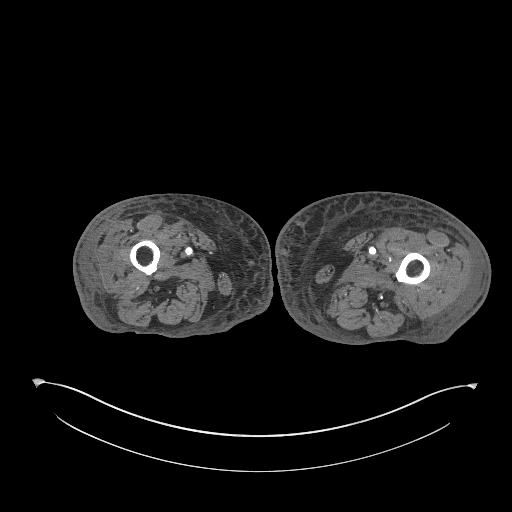
[im 351/495  soft-tissue]
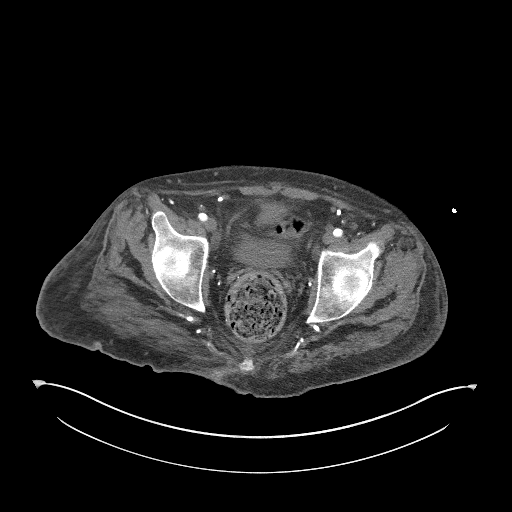
[im 447/495  soft-tissue]
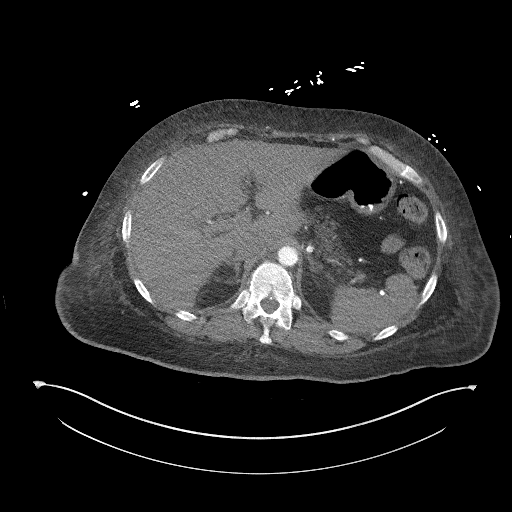

[Series 7: ap cor soft · coronal · 1.17mm/px · 3 of 139 slices shown]
[im 35/139  soft-tissue]
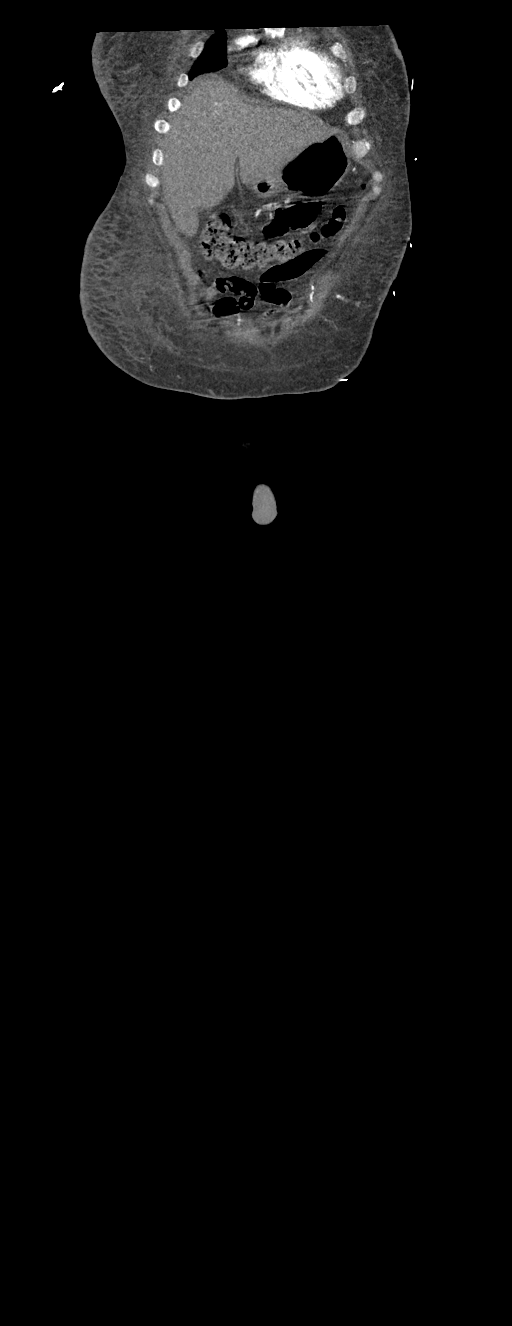
[im 70/139  soft-tissue]
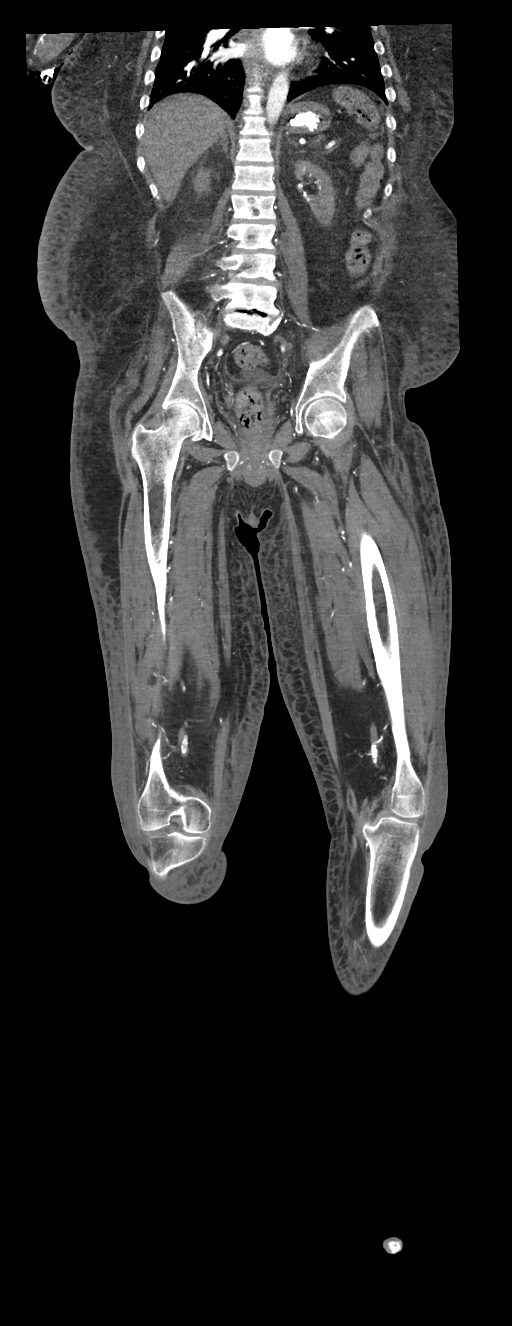
[im 104/139  soft-tissue]
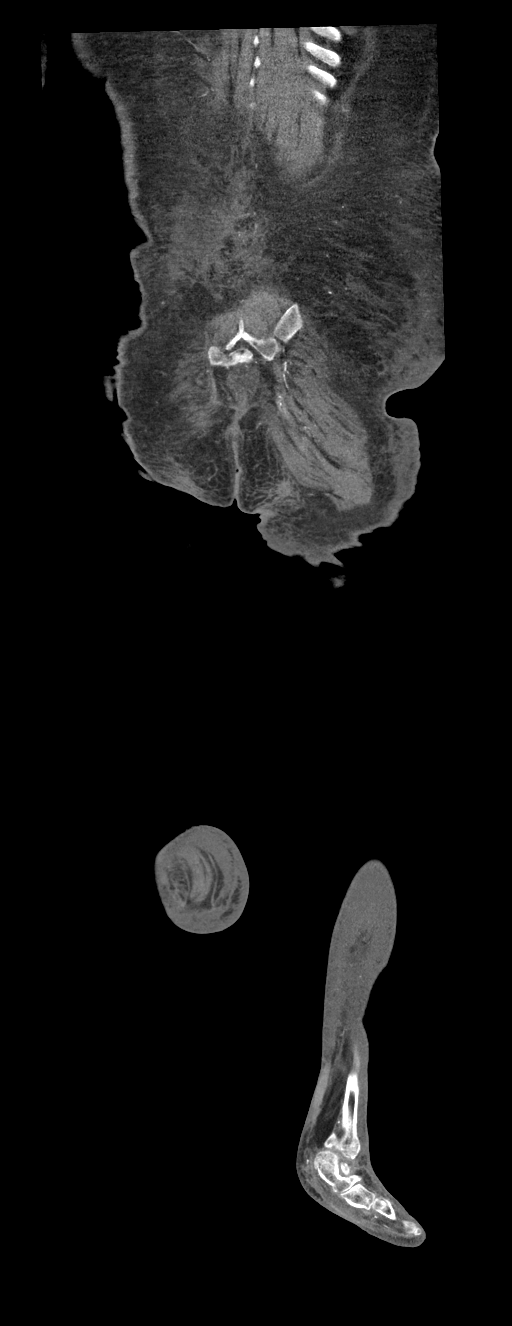

[8 of 46 positions shown; findings below may reference images not displayed]

FINDINGS: VASCULAR

Aorta: Moderate amount of calcified atherosclerotic plaque within
normal caliber abdominal aorta, not resulting in a hemodynamically
significant stenosis. No evidence of abdominal aortic dissection or
perivascular stranding.

Celiac: Widely patent without hemodynamically significant narrowing.

SMA: Widely patent without hemodynamically significant narrowing. A
replaced right hepatic artery is incidentally noted to arise from
the proximal SMA. The distal tributaries of the SMA appear widely
patent without discrete lumen filling defect to suggest distal
embolism.

Renals: Duplicated left renal arteries, nearly co-dominant. All
renal arteries are heavily diseased with tandem areas of suspected
hemodynamically significant narrowings, an expected finding given
end-stage renal disease and associated bilateral renal atrophy.

IMA: Diseased at its origin though remains patent with
collateralized arterial supply from the SMA.

_________________________________________________________

RIGHT Lower Extremity

Inflow: There is a minimal amount of calcified atherosclerotic
plaque involving the normal caliber right common iliac artery, not
resulting in a hemodynamically significant stenosis. The right
internal and external iliac arteries are mildly diseased though
patent and of normal caliber.

Outflow: There is mixed calcified and noncalcified atherosclerotic
plaque involving the right common femoral artery, not resulting in
hemodynamically significant stenosis. The right deep femoral artery
is disease though patent of normal caliber.

There is circumferential predominantly calcified atherosclerotic
plaque throughout the right superficial femoral artery, which
ultimately occludes at the level of the adductor canal. Both the
right above and below-knee popliteal artery is occluded.

Runoff: There is no significant tibial runoff to the residual
below-knee amputation, though evaluation degraded secondary to
extensive mural calcifications.

_________________________________________________________

LEFT Lower Extremity

Inflow: There is a minimal amount of calcified atherosclerotic
plaque involving the normal caliber left common iliac artery, not
resulting in a hemodynamically significant stenosis. The left
internal and external iliac arteries are mildly diseased though
patent and of normal caliber.

Outflow: There is a minimal amount of calcified atherosclerotic
plaque involving the left common femoral artery, not resulting in
hemodynamically significant stenosis.

The left deep femoral artery is patent and of normal caliber.

There is concentric predominantly calcified atherosclerotic plaque
throughout the left superficial femoral artery not definitely
resulting in hemodynamically significant stenosis, though note,
evaluation degraded secondary to extensive mural calcifications.

There is a moderate to large amount of eccentric mixed calcified and
noncalcified atherosclerotic plaque involving the left above knee
popliteal artery resulting in tandem areas of at least 50% luminal
narrowing (image 264, 287 and 293, series 5). The left below-knee
popliteal artery appears patent without hemodynamically significant
narrowing.

Runoff: Apparent three-vessel runoff to the left lower leg, though
note, evaluation degraded secondary to significant mural
calcifications. A definitive left-sided dorsalis pedis artery is not
identified. No discrete lumen filling defects to suggest distal
embolism.

Veins: Normal appearance of the IVC and pelvic venous system on this
arterial phase examination.

Review of the MIP images confirms the above findings.

_________________________________________________________

_________________________________________________________

NON-VASCULAR

Evaluation of abdominal organs is limited to the arterial phase of
enhancement.

Lower chest: Limited visualization of the lower thorax is negative
for focal airspace opacity though does demonstrate mild interstitial
thickening. No discrete focal airspace opacities. No pleural
effusion.

Cardiomegaly. Coronary artery calcifications. No pericardial
effusion.

Hepatobiliary: Normal hepatic contour. There is diffuse decreased
attenuation of the hepatic parenchyma suggestive of hepatic
steatosis. No discrete hyperenhancing hepatic lesions. Post
cholecystectomy. No intra or extrahepatic biliary duct dilatation.
Trace amount of perihepatic ascites.

Pancreas: Pancreas is largely fatty replaced.

Spleen: Normal appearance of the spleen.

Adrenals/Urinary Tract: The bilateral kidneys are markedly atrophic
compatible with known history of end-stage renal disease. No urinary
obstruction. There is mild thickening the left adrenal gland without
discrete nodule. Normal appearance of the right adrenal gland.
Diffuse thickening the urinary bladder wall, likely accentuated due
to underdistention.

Stomach/Bowel: Large colonic stool burden, particularly within the
rectal vault. No evidence of enteric obstruction. Normal appearance
of the terminal ileum and the retrocecal appendix. No hiatal hernia.
No discrete areas of bowel wall thickening. No pneumoperitoneum,
pneumatosis or portal venous gas.

Lymphatic: No bulky retroperitoneal, mesenteric, pelvic or inguinal
lymphadenopathy.

Reproductive: Normal appearance the prostate gland. There is a small
amount of fluid within the pelvic cul-de-sac.

Other: Apparent 1.3 cm soft tissue ulcer involving the medial mid
aspect of the left foot (image 459, series 5). No discrete areas of
osteolysis to suggest osteomyelitis. Diffuse body wall anasarca.

Musculoskeletal: No definite acute or aggressive osseous
abnormalities. There is diffuse increased sclerosis of the imaged
osseous structures suggestive of renal osteodystrophy. Prominent
Schmorl's node are seen involving several vertebral bodies, most
conspicuously involving L2. Patient is post right-sided below-knee
amputation as well as amputation of the first and second digits.
IMPRESSION: VASCULAR

1. Large amount of atherosclerotic plaque within normal caliber
abdominal aorta, not resulting in a hemodynamically significant
stenosis. Aortic aneurysm NOS ([4P]-[4P]).
2. Post right below-knee amputation with age-indeterminate though
presumably chronic occlusion of the distal aspect of the right
superficial femoral artery and the popliteal artery with no
significant runoff to the residual stump, though evaluation degraded
secondary to extensive mural calcifications.
3. Suspected tandem areas of hemodynamically significant narrowing
involving the left above knee popliteal artery.
4. Apparent three-vessel runoff to the left lower leg, though note,
evaluation degraded secondary to extensive mural calcifications. A
discrete left-sided dorsalis pedis artery is not identified. No
discrete intraluminal filling defects to suggest distal embolism.

NON-VASCULAR

1. Apparent 1.3 cm soft tissue ulcer about the medial mid aspect of
the left foot without discrete area of osteolysis to suggest
osteomyelitis. Further evaluation with MRI could be performed as
indicated.
2. Atrophic kidneys compatible with known history of end-stage renal
disease.
3. Cardiomegaly with diffuse body wall anasarca and stigmata of
pulmonary edema within the imaged lung bases. No pleural effusions.

## 2021-02-12 IMAGING — DX DG CHEST 1V PORT
1 series · 1 of 1 positions shown · non-contrast
Comparison: [DATE].

CLINICAL DATA: Shortness of breath.

EXAM:
PORTABLE CHEST 1 VIEW

[chest ap]
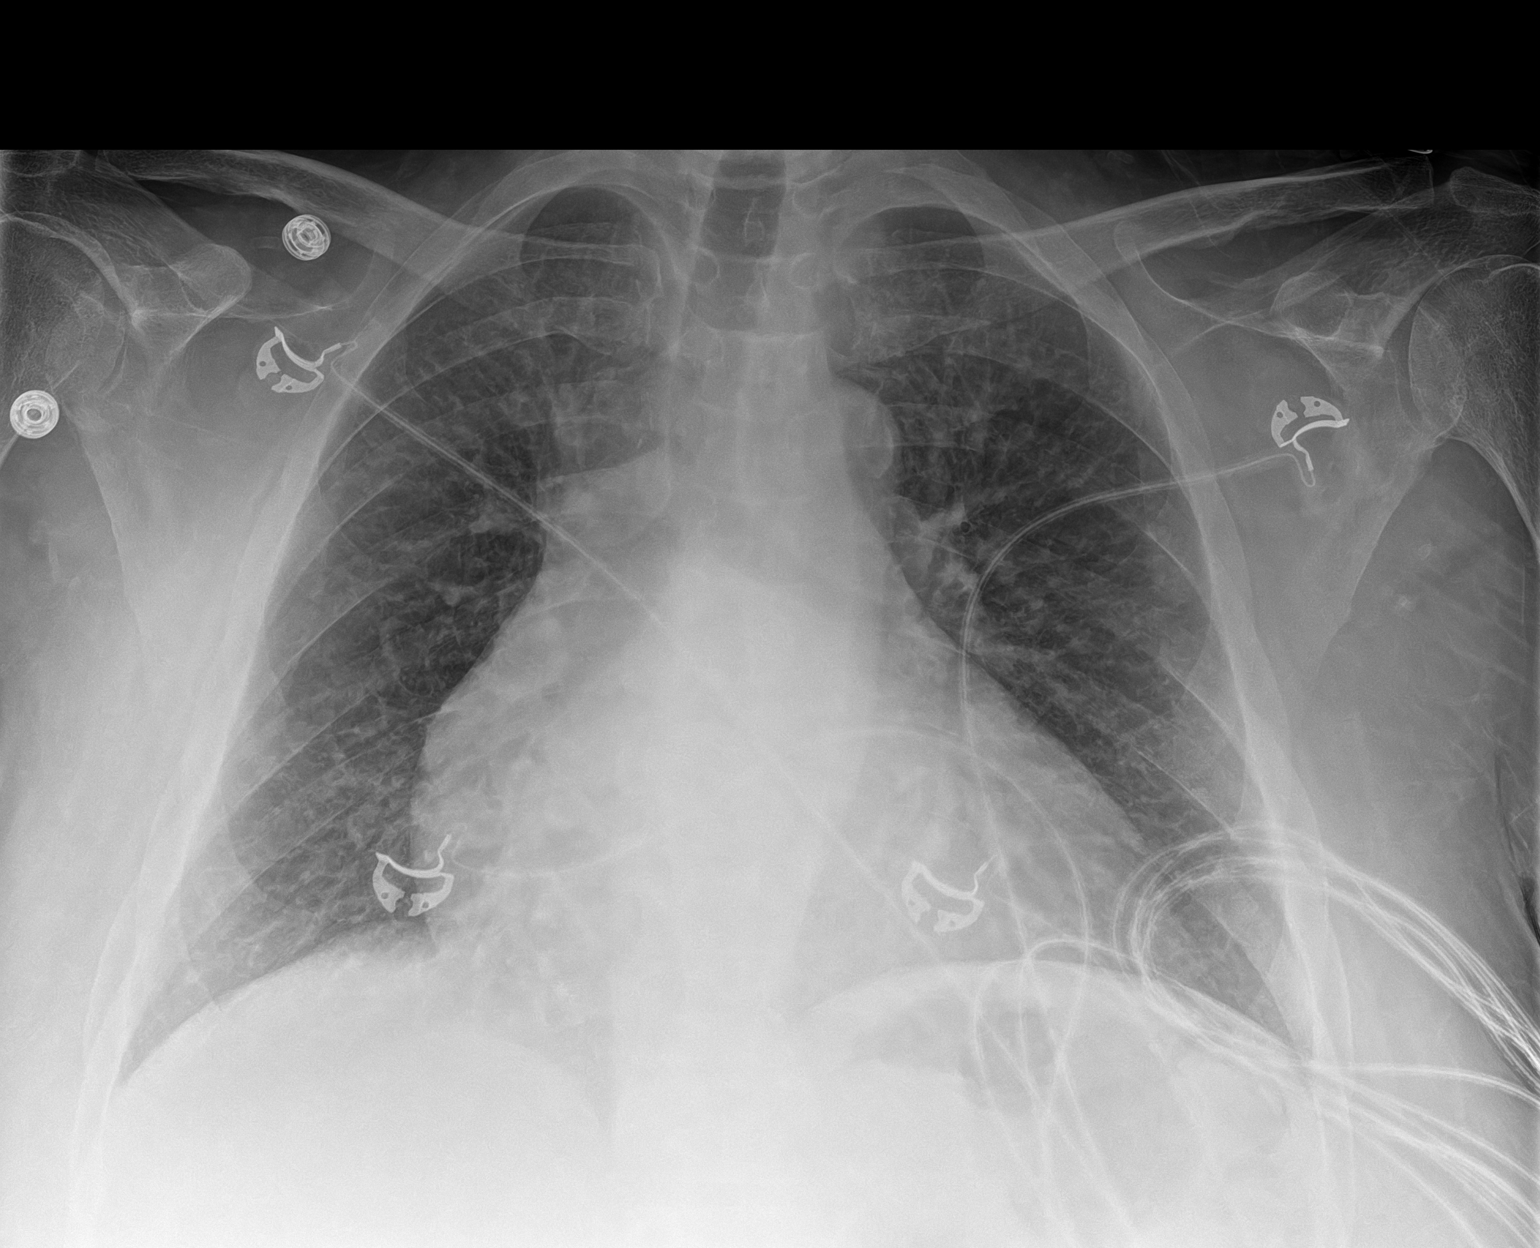

[1 of 1 positions shown; findings below may reference images not displayed]

FINDINGS: Stable cardiomegaly. Mild central pulmonary vascular congestion is
noted with probable minimal bilateral pulmonary edema. No
consolidative process is noted. Bony thorax is unremarkable.
IMPRESSION: Mild cardiomegaly with central pulmonary vascular congestion and
probable minimal bilateral pulmonary edema.

## 2021-02-12 IMAGING — DX DG FOOT COMPLETE 3+V*L*
2 series · 2 of 2 positions shown · non-contrast
Comparison: Radiographs dated [DATE]

CLINICAL DATA: Infection at the first metatarsophalangeal joint.

EXAM:
LEFT FOOT - COMPLETE 3+ VIEW

[foot ap]
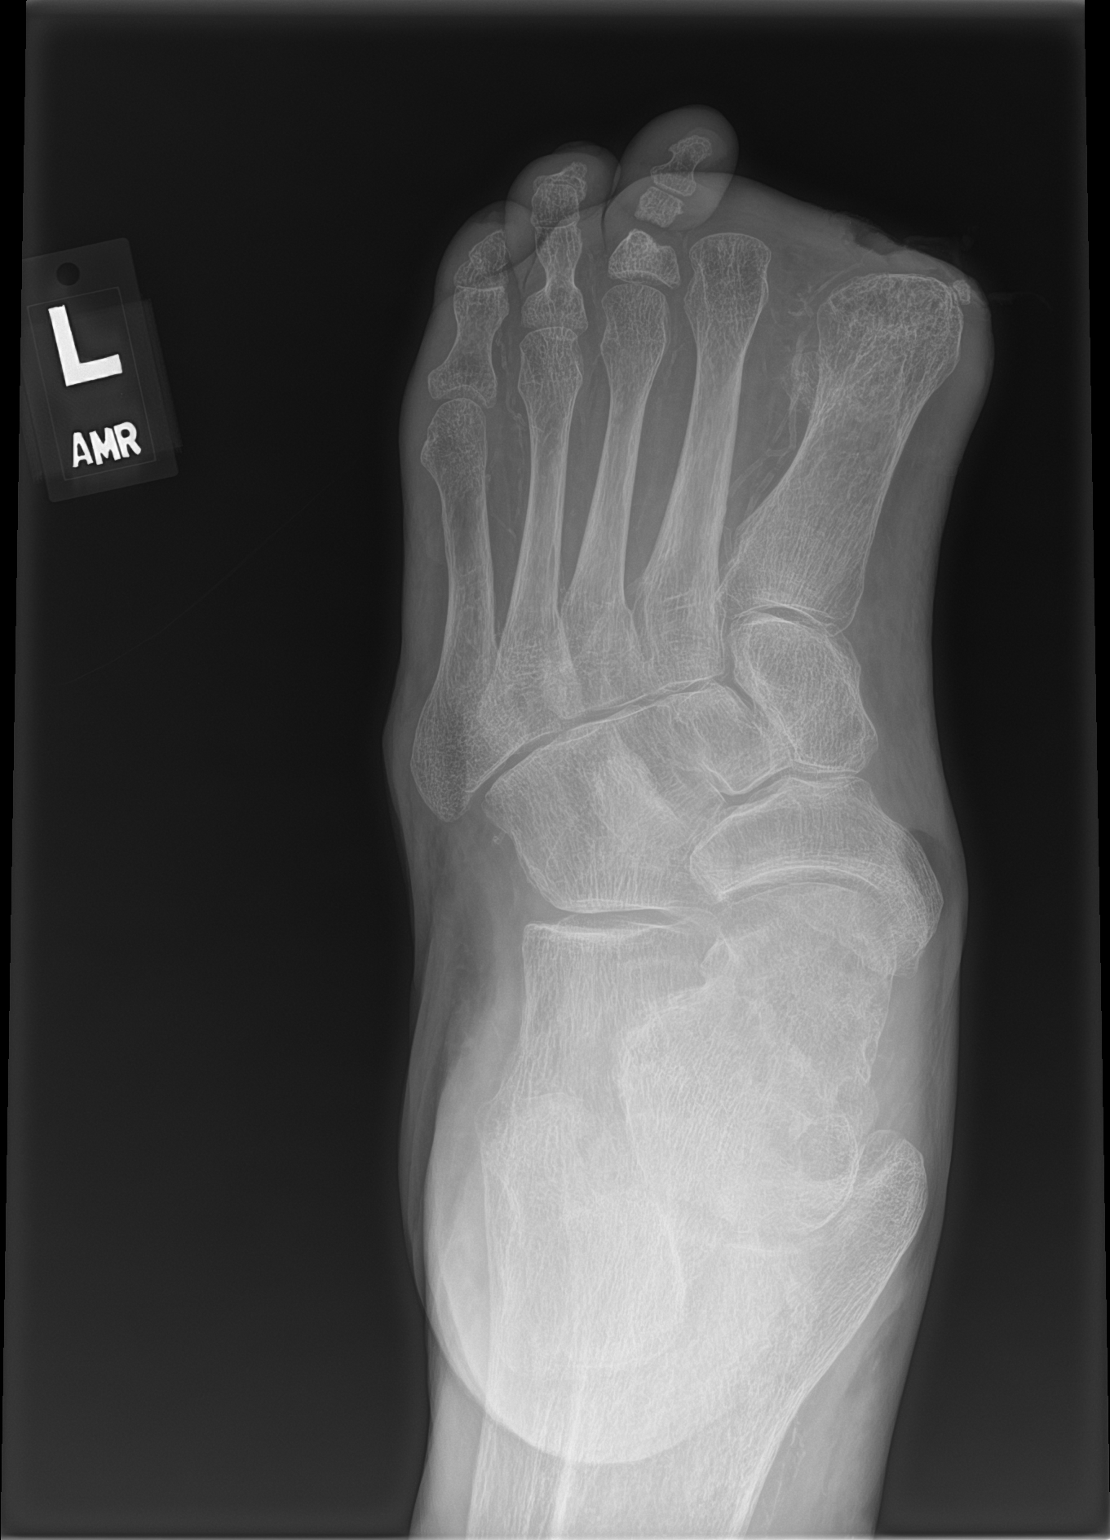

[foot obl]
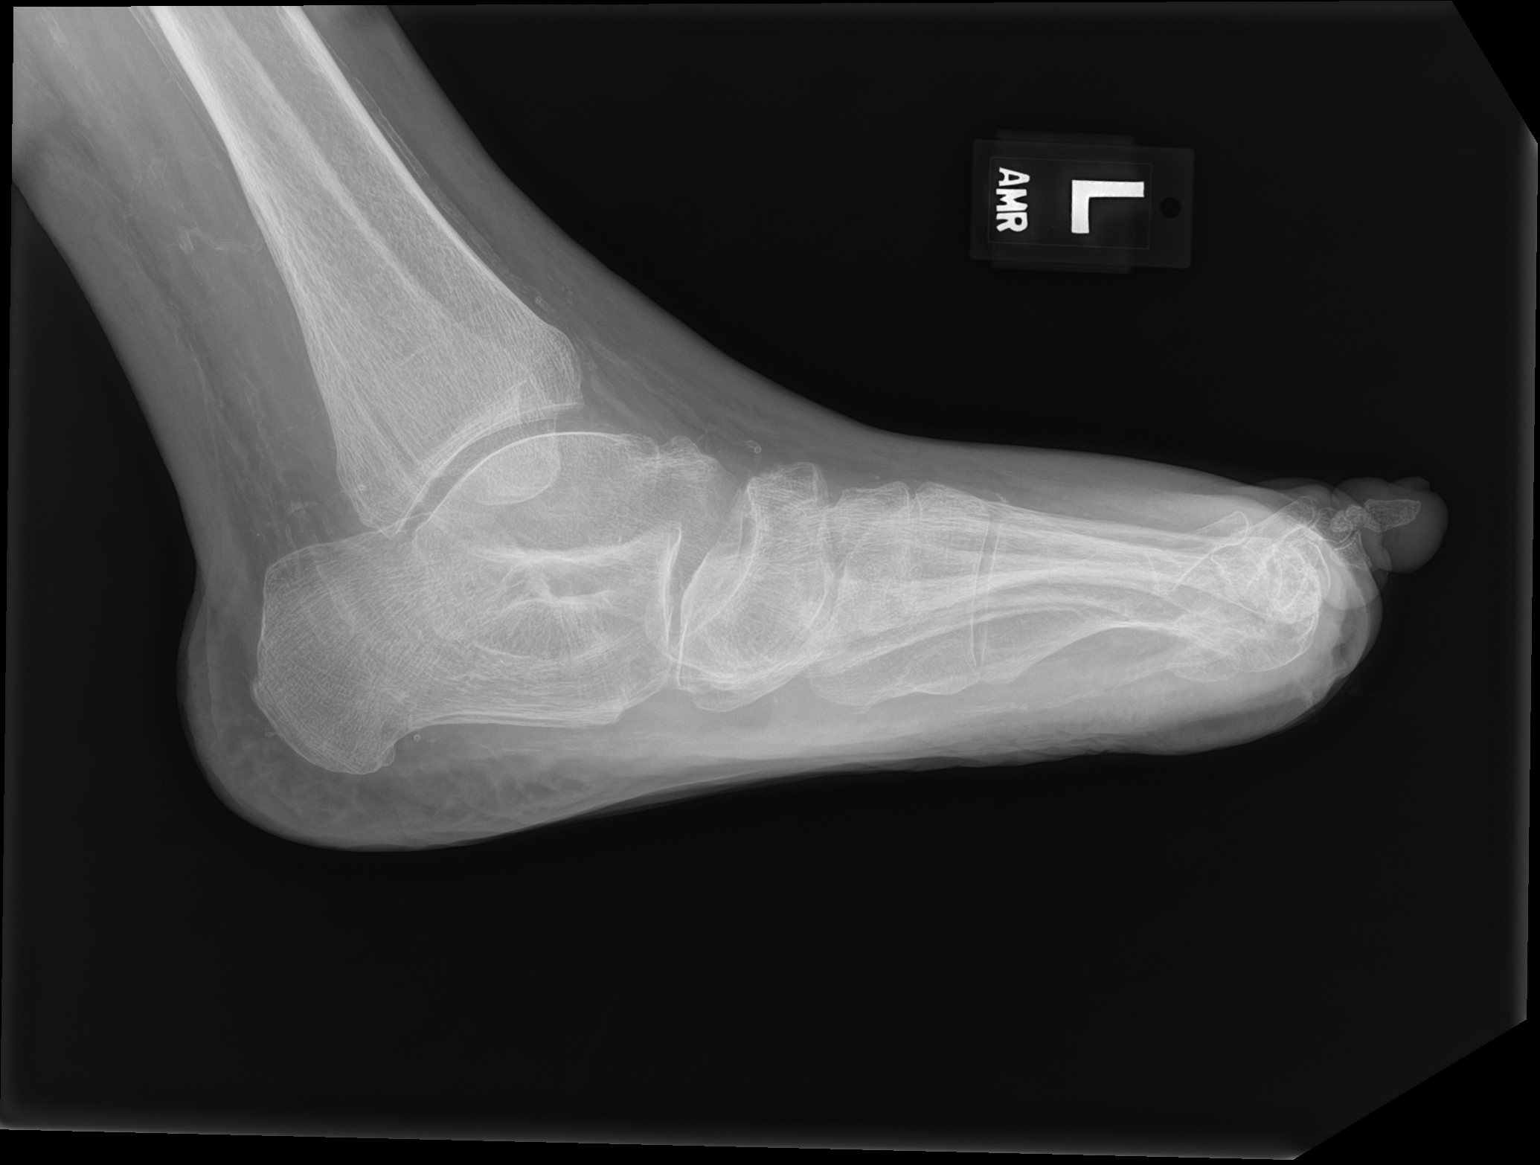

[2 of 2 positions shown; findings below may reference images not displayed]

FINDINGS: Status post amputation of the metatarsophalangeal joint of the first
and second digit. Osteotomy changes of the proximal phalanx of the
third digit. Soft tissue swelling and vascular calcifications. No
cortical erosion or periosteal reaction concerning for
osteomyelitis.
IMPRESSION: 1. Postsurgical changes as detailed above.
2. No radiographic evidence of osteomyelitis, if there is a clinical
concern, MRI examination could be obtained for further evaluation.

## 2021-02-12 MED ORDER — IPRATROPIUM BROMIDE 0.02 % IN SOLN: 0.5000 mg | Freq: Four times a day (QID) | RESPIRATORY_TRACT | Status: DC | PRN

## 2021-02-12 MED ORDER — VITAMIN D 25 MCG (1000 UNIT) PO TABS
1000.0000 [IU] | ORAL_TABLET | ORAL | Status: DC
  Administered 2021-02-17 – 2021-02-19 (×2): 1000 [IU] via ORAL
  Filled 2021-02-12 (×6): qty 1

## 2021-02-12 MED ORDER — SODIUM CHLORIDE 0.9% FLUSH: 3.0000 mL | INTRAVENOUS | Status: DC | PRN

## 2021-02-12 MED ORDER — HEPARIN SODIUM (PORCINE) 1000 UNIT/ML DIALYSIS: 20.0000 [IU]/kg | INTRAMUSCULAR | Status: DC | PRN

## 2021-02-12 MED ORDER — ACETAMINOPHEN 325 MG PO TABS
650.0000 mg | ORAL_TABLET | Freq: Four times a day (QID) | ORAL | Status: DC | PRN
  Filled 2021-02-12: qty 2

## 2021-02-12 MED ORDER — INSULIN ASPART 100 UNIT/ML IV SOLN
5.0000 [IU] | Freq: Once | INTRAVENOUS | Status: AC
  Administered 2021-02-12: 5 [IU] via INTRAVENOUS

## 2021-02-12 MED ORDER — PANTOPRAZOLE SODIUM 20 MG PO TBEC
20.0000 mg | DELAYED_RELEASE_TABLET | Freq: Every day | ORAL | Status: DC
  Administered 2021-02-13 – 2021-02-19 (×5): 20 mg via ORAL
  Filled 2021-02-12 (×5): qty 1

## 2021-02-12 MED ORDER — SODIUM CHLORIDE 0.9% FLUSH
3.0000 mL | Freq: Two times a day (BID) | INTRAVENOUS | Status: DC
  Administered 2021-02-14 – 2021-02-18 (×6): 3 mL via INTRAVENOUS

## 2021-02-12 MED ORDER — HYDRALAZINE HCL 20 MG/ML IJ SOLN: 10.0000 mg | INTRAMUSCULAR | Status: DC | PRN

## 2021-02-12 MED ORDER — ATORVASTATIN CALCIUM 10 MG PO TABS
10.0000 mg | ORAL_TABLET | ORAL | Status: DC
  Administered 2021-02-15 – 2021-02-17 (×2): 10 mg via ORAL
  Filled 2021-02-12 (×2): qty 1

## 2021-02-12 MED ORDER — CHLORHEXIDINE GLUCONATE CLOTH 2 % EX PADS: 6.0000 | MEDICATED_PAD | Freq: Every day | CUTANEOUS | Status: DC

## 2021-02-12 MED ORDER — CALCIUM GLUCONATE 10 % IV SOLN
1.0000 g | Freq: Once | INTRAVENOUS | Status: AC
  Administered 2021-02-12: 1 g via INTRAVENOUS
  Filled 2021-02-12: qty 10

## 2021-02-12 MED ORDER — SODIUM CHLORIDE 0.9 % IV SOLN: 100.0000 mL | INTRAVENOUS | Status: DC | PRN

## 2021-02-12 MED ORDER — ALBUMIN HUMAN 25 % IV SOLN
12.5000 g | Freq: Once | INTRAVENOUS | Status: AC
  Administered 2021-02-12: 12.5 g via INTRAVENOUS
  Filled 2021-02-12: qty 50

## 2021-02-12 MED ORDER — SODIUM CHLORIDE 0.9 % IV SOLN: 250.0000 mL | INTRAVENOUS | Status: DC | PRN

## 2021-02-12 MED ORDER — SENNOSIDES-DOCUSATE SODIUM 8.6-50 MG PO TABS: 1.0000 | ORAL_TABLET | Freq: Every evening | ORAL | Status: DC | PRN

## 2021-02-12 MED ORDER — ONDANSETRON HCL 4 MG PO TABS: 4.0000 mg | ORAL_TABLET | Freq: Four times a day (QID) | ORAL | Status: DC | PRN

## 2021-02-12 MED ORDER — LIDOCAINE-PRILOCAINE 2.5-2.5 % EX CREA: 1.0000 "application " | TOPICAL_CREAM | CUTANEOUS | Status: DC | PRN

## 2021-02-12 MED ORDER — CARVEDILOL 3.125 MG PO TABS
3.1250 mg | ORAL_TABLET | Freq: Two times a day (BID) | ORAL | Status: DC
  Administered 2021-02-13 – 2021-02-17 (×8): 3.125 mg via ORAL
  Filled 2021-02-12 (×10): qty 1

## 2021-02-12 MED ORDER — SODIUM CHLORIDE 0.9 % IV SOLN: INTRAVENOUS | Status: AC

## 2021-02-12 MED ORDER — SODIUM CHLORIDE 0.9 % IV BOLUS
500.0000 mL | Freq: Once | INTRAVENOUS | Status: AC
  Administered 2021-02-12: 500 mL via INTRAVENOUS

## 2021-02-12 MED ORDER — ALBUTEROL SULFATE (2.5 MG/3ML) 0.083% IN NEBU
10.0000 mg | INHALATION_SOLUTION | Freq: Once | RESPIRATORY_TRACT | Status: AC
  Administered 2021-02-12: 10 mg via RESPIRATORY_TRACT
  Filled 2021-02-12: qty 12

## 2021-02-12 MED ORDER — ASPIRIN 81 MG PO CHEW
81.0000 mg | CHEWABLE_TABLET | Freq: Every day | ORAL | Status: DC
  Administered 2021-02-13 – 2021-02-19 (×5): 81 mg via ORAL
  Filled 2021-02-12 (×5): qty 1

## 2021-02-12 MED ORDER — OXYCODONE HCL 5 MG PO TABS
5.0000 mg | ORAL_TABLET | ORAL | Status: DC | PRN
  Administered 2021-02-12 – 2021-02-19 (×16): 5 mg via ORAL
  Filled 2021-02-12 (×16): qty 1

## 2021-02-12 MED ORDER — ACETAMINOPHEN 650 MG RE SUPP: 650.0000 mg | Freq: Four times a day (QID) | RECTAL | Status: DC | PRN

## 2021-02-12 MED ORDER — SODIUM CHLORIDE 0.9% FLUSH
3.0000 mL | Freq: Two times a day (BID) | INTRAVENOUS | Status: DC
  Administered 2021-02-14 – 2021-02-18 (×7): 3 mL via INTRAVENOUS

## 2021-02-12 MED ORDER — VANCOMYCIN HCL IN DEXTROSE 1-5 GM/200ML-% IV SOLN
1000.0000 mg | INTRAVENOUS | Status: DC
  Administered 2021-02-12 – 2021-02-17 (×3): 1000 mg via INTRAVENOUS
  Filled 2021-02-12 (×7): qty 200

## 2021-02-12 MED ORDER — SODIUM ZIRCONIUM CYCLOSILICATE 5 G PO PACK
10.0000 g | PACK | Freq: Once | ORAL | Status: AC
  Administered 2021-02-12: 10 g via ORAL
  Filled 2021-02-12: qty 2

## 2021-02-12 MED ORDER — HYDROMORPHONE HCL 1 MG/ML IJ SOLN
0.5000 mg | Freq: Once | INTRAMUSCULAR | Status: AC
  Administered 2021-02-12: 0.5 mg via INTRAVENOUS
  Filled 2021-02-12: qty 1

## 2021-02-12 MED ORDER — HEPARIN (PORCINE) 25000 UT/250ML-% IV SOLN
1500.0000 [IU]/h | INTRAVENOUS | Status: DC
  Administered 2021-02-12 – 2021-02-13 (×2): 1500 [IU]/h via INTRAVENOUS
  Filled 2021-02-12 (×2): qty 250

## 2021-02-12 MED ORDER — HYDROMORPHONE HCL 1 MG/ML IJ SOLN
0.5000 mg | INTRAMUSCULAR | Status: DC | PRN
  Administered 2021-02-13 (×2): 1 mg via INTRAVENOUS
  Administered 2021-02-14: 0.5 mg via INTRAVENOUS
  Administered 2021-02-14: 1 mg via INTRAVENOUS
  Administered 2021-02-15: 0.5 mg via INTRAVENOUS
  Administered 2021-02-15 – 2021-02-17 (×6): 1 mg via INTRAVENOUS
  Administered 2021-02-17: 0.5 mg via INTRAVENOUS
  Administered 2021-02-17 – 2021-02-19 (×9): 1 mg via INTRAVENOUS
  Filled 2021-02-12 (×19): qty 1

## 2021-02-12 MED ORDER — DEXTROSE 50 % IV SOLN
1.0000 | Freq: Once | INTRAVENOUS | Status: AC
  Administered 2021-02-12: 50 mL via INTRAVENOUS
  Filled 2021-02-12: qty 50

## 2021-02-12 MED ORDER — CALCIUM ACETATE (PHOS BINDER) 667 MG PO CAPS
667.0000 mg | ORAL_CAPSULE | Freq: Three times a day (TID) | ORAL | Status: DC
  Administered 2021-02-13 – 2021-02-19 (×15): 667 mg via ORAL
  Filled 2021-02-12 (×14): qty 1

## 2021-02-12 MED ORDER — PENTAFLUOROPROP-TETRAFLUOROETH EX AERO: 1.0000 "application " | INHALATION_SPRAY | CUTANEOUS | Status: DC | PRN

## 2021-02-12 MED ORDER — CALCIUM CARBONATE ANTACID 500 MG PO CHEW
800.0000 mg | CHEWABLE_TABLET | Freq: Two times a day (BID) | ORAL | Status: DC
  Administered 2021-02-12 – 2021-02-19 (×12): 800 mg via ORAL
  Filled 2021-02-12 (×13): qty 4

## 2021-02-12 MED ORDER — ZOLPIDEM TARTRATE 5 MG PO TABS
5.0000 mg | ORAL_TABLET | Freq: Every evening | ORAL | Status: DC | PRN
  Administered 2021-02-13 – 2021-02-17 (×5): 5 mg via ORAL
  Filled 2021-02-12 (×5): qty 1

## 2021-02-12 MED ORDER — DIPHENHYDRAMINE HCL 50 MG/ML IJ SOLN: 25.0000 mg | Freq: Once | INTRAMUSCULAR | Status: AC

## 2021-02-12 MED ORDER — ONDANSETRON HCL 4 MG/2ML IJ SOLN: 4.0000 mg | Freq: Four times a day (QID) | INTRAMUSCULAR | Status: DC | PRN

## 2021-02-12 MED ORDER — DIPHENHYDRAMINE HCL 50 MG/ML IJ SOLN
INTRAMUSCULAR | Status: AC
  Administered 2021-02-12: 25 mg via INTRAVENOUS
  Filled 2021-02-12: qty 1

## 2021-02-12 MED ORDER — BUTALBITAL-APAP-CAFFEINE 50-325-40 MG PO TABS: 1.0000 | ORAL_TABLET | Freq: Four times a day (QID) | ORAL | Status: DC | PRN

## 2021-02-12 MED ORDER — IOHEXOL 350 MG/ML SOLN
125.0000 mL | Freq: Once | INTRAVENOUS | Status: AC | PRN
  Administered 2021-02-12: 125 mL via INTRAVENOUS

## 2021-02-12 MED ORDER — LIDOCAINE HCL (PF) 1 % IJ SOLN: 5.0000 mL | INTRAMUSCULAR | Status: DC | PRN

## 2021-02-12 MED ORDER — BISACODYL 5 MG PO TBEC
5.0000 mg | DELAYED_RELEASE_TABLET | Freq: Every day | ORAL | Status: DC | PRN
  Filled 2021-02-12: qty 1

## 2021-02-12 NOTE — Progress Notes (Signed)
ANTICOAGULATION CONSULT NOTE - Initial Consult  Pharmacy Consult for heparin Indication:  ischemia of left foot  Allergies  Allergen Reactions   Tape Other (See Comments)    Pulls skin off    Patient Measurements: Height: 5\' 9"  (175.3 cm) Weight: 93.4 kg (205 lb 14.6 oz) IBW/kg (Calculated) : 70.7 HEPARIN DW (KG): 89.9   Vital Signs: Temp: 97.5 F (36.4 C) (01/06 0849) Temp Source: Oral (01/06 0849) BP: 104/78 (01/06 1015) Pulse Rate: 88 (01/06 1015)  Labs: Recent Labs    02/12/21 1005  HGB 11.3*  HCT 35.2*  PLT 233  CREATININE 8.89*    Estimated Creatinine Clearance: 11.3 mL/min (A) (by C-G formula based on SCr of 8.89 mg/dL (H)).   Medical History: Past Medical History:  Diagnosis Date   Anemia    Anxiety    Blood transfusion without reported diagnosis    CHF (congestive heart failure) ([place])    a. EF 25% by echo in 07/2019   Chronic kidney disease    Depression    Diabetes mellitus without complication ([place])    End stage renal disease ([place])    M/W/F [facility]   Hyperlipidemia    Neuropathy    Peripheral vascular disease (HCC)    PTSD (post-traumatic stress disorder)     Medications:  See med rec  Assessment: Patient with medical history including end-stage renal disease on dialysis Monday Wednesday Friday congestive heart failure with an EF of 20 to 25% recent MRSA bacteremia, right leg above-the-knee amputation presents emergency department chief complaint of weakness. Patient with worsening pain in his foot and MD concerned with ischemia/thrombosis in leg. He is on chronic anticoagulation with eliquis. Last dose on 1/4 at 1700. Pharmacy asked to start heparin. Will monitor APTT and HL until correlate, likely HL will be elevated since he has been on eliquis.   Goal of Therapy:  Heparin level 0.3-0.7 units/ml aPTT 66-102 seconds Monitor platelets by anticoagulation protocol: Yes   Plan:  Start heparin infusion at 1500 units/hr Check baseline  APTT and anti-Xa level as well as in ~6-8 hours and daily while on heparin Continue to monitor H&H and platelets  Isac Sarna, BS [place], BCPS Clinical Pharmacist Pager [phone] 02/12/2021,11:18 AM

## 2021-02-12 NOTE — Procedures (Signed)
° °  EMERGENT HEMODIALYSIS TREATMENT NOTE:   4 hour session completed using left upper arm AVG (16g ante/retrograde) and using 0K bath for the first 30 minutes followed by 1K bath for the remainder of treatment.  Soft BPs initially, improved after Albumin 12.5g x1.  Diphenhydramine 25g given IVP for c/o severe itching ("they gave me dilaudid and that contrast in my IV") with minimal relief reported.  Goal not met d/t soft blood pressures.  Net UF 1.8 liters.  Vancomycin was given during the last hour of HD session.  Missed treatment (and Vanco) on Wednesday and states it was not given on Monday, either.   Rockwell Alexandria, RN

## 2021-02-12 NOTE — H&P (Signed)
History and Physical   Patient: Brendan Campbell                            PCP: Monico Blitz, MD                    DOB: [birth year]            DOA: 02/12/2021 NWG:[id number]             DOS: 02/12/2021, 12:50 PM  Monico Blitz, MD  Patient coming from:   HOME  I have personally reviewed patient's medical records, in electronic medical records, including:  [place] link, and care everywhere.    Chief Complaint:   Hyperkalemia, missed hemodialysis, pain discoloration left lower extremity   History of present illness:    Brendan Campbell is a 51 y.o. male with medical history significant of end-stage renal disease (HD MWF), CHF with ejection fraction 20-25%, recent MRSA bacteremia ( on IV vancomycin with HD), right leg AKA, PVD, DM type II, HTN, HLD, anxiety, depression, medically noncompliant  .[name]  Was recently discharged 02/06/2021 with MRSA bacteremia from a left leg cellulitis... Supposed to be on vancomycin for 6 weeks  Presented today with with missed hemodialysis on Monday and Wednesday which she normally skips.  Also worsening left leg pain, discoloration.  Reported that he was feeling weak so he could not do dialysis Wednesday or today.  Patient Denies having: Fever, Chills, Cough, SOB, Chest Pain, Abd pain, N/V/D, headache, dizziness, lightheadedness,  Dysuria.  ED Course:   Vitals:   02/12/21 1145 02/12/21 1200  BP: (!) 89/75 117/90  Pulse: 93 90  Resp: 14 20  Temp:    SpO2: 96% 99%   Abnormal labs; hemodynamically stable T-max 95.5,  Potassium > 7.5, BUN 76, albumin 3.3, lactic acid 2.3, INR 1.4 Patient was treated for hyperkalemia with IV calcium gluconate, D50, insulin, albuterol, Lokelma 10 g  Case was discussed with vascular team at [facility] Dr. Quentin Cornwall who requested CT angiogram of lower extremity and transfer to [facility] for further evaluation  Nephrology was consulted for urgent hemodialysis, CT angiogram ordered along with vancomycin and blood cultures were  obtained  Review of Systems: As per HPI, otherwise 10 point review of systems were negative.   ----------------------------------------------------------------------------------------------------------------------  Allergies  Allergen Reactions   Tape Other (See Comments)    Pulls skin off    Home MEDs:  Prior to Admission medications   Medication Sig Start Date End Date Taking? Authorizing Provider  acetaminophen (TYLENOL) 325 MG tablet Take 2 tablets (650 mg total) by mouth every 6 (six) hours as needed for mild pain, moderate pain or fever. 02/06/21  Yes Cherene Altes, MD  albuterol (VENTOLIN HFA) 108 (90 Base) MCG/ACT inhaler Inhale 2 puffs into the lungs every 4 (four) hours as needed for wheezing or shortness of breath. 07/21/20  Yes Manuella Ghazi, Pratik D, DO  apixaban (ELIQUIS) 5 MG TABS tablet Take 1 tablet (5 mg total) by mouth 2 (two) times daily. 08/23/19  Yes Strader, Fransisco Hertz, PA-C  aspirin 81 MG chewable tablet Chew 81 mg by mouth daily.   Yes [provider]  atorvastatin (LIPITOR) 10 MG tablet Take 1 tablet (10 mg total) by mouth every evening. Patient taking differently: Take 10 mg by mouth every evening. Take 1 tablet on Monday, Wednesday and Friday. 08/03/19  Yes Barton Dubois, MD  butalbital-acetaminophen-caffeine (FIORICET) [phone] MG tablet Take 1  tablet by mouth every 6 (six) hours as needed for headache or migraine. 07/21/20  Yes Shah, Pratik D, DO  calcium acetate (PHOSLO) 667 MG capsule Take 1 capsule (667 mg total) by mouth 3 (three) times daily with meals. 02/21/19  Yes Donnamae Jude, MD  calcium carbonate (TUMS - DOSED IN MG ELEMENTAL CALCIUM) 500 MG chewable tablet Chew 4 tablets (800 mg of elemental calcium total) by mouth 2 (two) times daily. 08/03/19  Yes Barton Dubois, MD  Cholecalciferol (VITAMIN D3) 25 MCG (1000 UT) CAPS Take 1 capsule by mouth See admin instructions. Take before Dialysis on Monday Wednesday and Fridays   Yes [provider]  oxyCODONE (OXY IR/ROXICODONE) 5 MG immediate release tablet Take 1 tablet (5 mg total) by mouth every 6 (six) hours as needed for severe pain. 02/06/21  Yes Cherene Altes, MD  pantoprazole (PROTONIX) 20 MG tablet Take 1 tablet (20 mg total) by mouth daily. 11/26/20  Yes Milton Ferguson, MD  vancomycin (VANCOCIN) 1-5 GM/200ML-% SOLN Inject 200 mLs (1,000 mg total) into the vein every Monday, Wednesday, and Friday with hemodialysis. 02/08/21 03/22/21 Yes Cherene Altes, MD  carvedilol (COREG) 3.125 MG tablet Take 1 tablet (3.125 mg total) by mouth 2 (two) times daily with a meal. Patient not taking: Reported on 12/07/2020 04/09/20 11/26/20  Deatra Armand, MD  patiromer (VELTASSA) 8.4 g packet Take 1 packet (8.4 g total) by mouth 3 (three) times a week. On Tuesday, Thursday and Saturday Patient not taking: Reported on 02/12/2021 06/10/19   Kathie Dike, MD    PRN MEDs: sodium chloride, acetaminophen **OR** acetaminophen, bisacodyl, hydrALAZINE, HYDROmorphone (DILAUDID) injection, ipratropium, ondansetron **OR** ondansetron (ZOFRAN) IV, oxyCODONE, senna-docusate, sodium chloride flush, zolpidem  Past Medical History:  Diagnosis Date   Anemia    Anxiety    Blood transfusion without reported diagnosis    CHF (congestive heart failure) ([place])    a. EF 25% by echo in 07/2019   Chronic kidney disease    Depression    Diabetes mellitus without complication ([place])    End stage renal disease ([place])    M/W/F [facility]   Hyperlipidemia    Neuropathy    Peripheral vascular disease (HCC)    PTSD (post-traumatic stress disorder)     Past Surgical History:  Procedure Laterality Date   A/V FISTULAGRAM N/A 10/09/2018   Procedure: A/V FISTULAGRAM;  Surgeon: Serafina Mitchell, MD;  Location: [place] [facility];  Service: Cardiovascular;  Laterality: N/A;   AV FISTULA PLACEMENT Left 09/22/2016   Procedure: CREATION OF LEFT ARM ARTERIOVENOUS (AV) FISTULA;  Surgeon: Waynetta Sandy, MD;   Location: [place];  Service: Vascular;  Laterality: Left;   AV FISTULA PLACEMENT Left 10/31/2017   Procedure: INSERTION OF ARTERIOVENOUS (AV) GORE-TEX 4-32mm STETCH GRAFT LEFT ARM;  Surgeon: Waynetta Sandy, MD;  Location: [place];  Service: Vascular;  Laterality: Left;   [place] Left 08/17/2017   Procedure: SECOND STAGE BASILIC VEIN TRANSPOSITION LEFT ARM;  Surgeon: Waynetta Sandy, MD;  Location: [place];  Service: Vascular;  Laterality: Left;   BELOW KNEE LEG AMPUTATION Right    CARDIOVERSION N/A 02/19/2019   Procedure: CARDIOVERSION;  Surgeon: [name], MD;  Location: [place];  Service: Cardiovascular;  Laterality: N/A;   CATARACT EXTRACTION W/PHACO Right 10/23/2020   Procedure: CATARACT EXTRACTION PHACO AND INTRAOCULAR LENS PLACEMENT ([place]);  Surgeon: Baruch Goldmann, MD;  Location: [facility];  Service: Ophthalmology;  Laterality: Right;  CDE 39.03  CHOLECYSTECTOMY     FOOT SURGERY     IR FLUORO GUIDE CV LINE RIGHT  05/16/2016   IR FLUORO GUIDE CV LINE RIGHT  02/16/2019   IR REMOVAL TUN CV CATH W/O FL  05/16/2016   IR REMOVAL TUN CV CATH W/O FL  02/19/2019   IR THROMBECTOMY AV FISTULA W/THROMBOLYSIS/PTA INC/SHUNT/IMG LEFT Left 11/09/2018   IR THROMBECTOMY AV FISTULA W/THROMBOLYSIS/PTA INC/SHUNT/IMG LEFT Left 06/22/2019   IR US GUIDE VASC ACCESS LEFT  11/09/2018   IR US GUIDE VASC ACCESS LEFT  06/22/2019   IR US GUIDE VASC ACCESS RIGHT  05/16/2016   IR US GUIDE VASC ACCESS RIGHT  02/16/2019   PERIPHERAL VASCULAR BALLOON ANGIOPLASTY  10/09/2018   Procedure: PERIPHERAL VASCULAR BALLOON ANGIOPLASTY;  Surgeon: Serafina Mitchell, MD;  Location: [place] [facility];  Service: Cardiovascular;;   TEE WITHOUT CARDIOVERSION N/A 02/19/2019   Procedure: TRANSESOPHAGEAL ECHOCARDIOGRAM (TEE);  Surgeon: [name], MD;  Location: [facility];  Service: Cardiovascular;  Laterality: N/A;     reports that he has been smoking. He has never used smokeless tobacco. He reports that  he does not drink alcohol and does not use drugs.   Family History  Problem Relation Age of Onset   Cancer Mother        lung   Diabetes Mother    Heart attack Father    Diabetes Father    Diabetes Sister     Physical Exam:   Vitals:   02/12/21 1121 02/12/21 1130 02/12/21 1145 02/12/21 1200  BP:  (!) 82/69 (!) 89/75 117/90  Pulse:  92 93 90  Resp:  14 14 20   Temp:      TempSrc:      SpO2: 100% 100% 96% 99%  Weight:      Height:       Constitutional: NAD, calm, comfortable Eyes: PERRL, lids and conjunctivae normal ENMT: Mucous membranes are moist. Posterior pharynx clear of any exudate or lesions.Normal dentition.  Neck: normal, supple, no masses, no thyromegaly Respiratory: clear to auscultation bilaterally, no wheezing, no crackles. Normal respiratory effort. No accessory muscle use.  Cardiovascular: Regular rate and rhythm, no murmurs / rubs / gallops. No extremity edema. 2+ pedal pulses. No carotid bruits.  Abdomen: no tenderness, no masses palpated. No hepatosplenomegaly. Bowel sounds positive.  Musculoskeletal: left foot cyanosis. Rt AKA  No joint deformity upper extremities. Upper ext.Good ROM, no contractures. Normal muscle tone in upper Ext.   Neurologic: CN II-XII grossly intact. Decrease Sensation in LL Ext. ,  Psychiatric: Normal judgment and insight. Alert and oriented x 3. Normal mood.  Skin: no rashes, lesions, ulcers. No induration Decubitus/ulcers:          Wounds: per nursing documentation         Labs on admission:    I have personally reviewed following labs and imaging studies  CBC: Recent Labs  Lab 02/12/21 1005  WBC 8.3  NEUTROABS 5.7  HGB 11.3*  HCT 35.2*  MCV 108.3*  PLT 962   Basic Metabolic Panel: Recent Labs  Lab 02/12/21 1005  NA 135  K >7.5*  CL 95*  CO2 22  GLUCOSE 92  BUN 76*  CREATININE 8.89*  CALCIUM 8.9  MG 2.3   GFR: Estimated Creatinine Clearance: 11.3 mL/min (A) (by C-G formula based on SCr of 8.89  mg/dL (H)). Liver Function Tests: Recent Labs  Lab 02/12/21 1005  AST 17  ALT 12  ALKPHOS 83  BILITOT 1.0  PROT 7.1  ALBUMIN 3.3*   No results for input(s): LIPASE, AMYLASE in the last 168 hours. No results for input(s): AMMONIA in the last 168 hours. Coagulation Profile: Recent Labs  Lab 02/12/21 1005  INR 1.4*   Cardiac Enzymes: No results for input(s): CKTOTAL, CKMB, CKMBINDEX, TROPONINI in the last 168 hours. BNP (last 3 results) No results for input(s): PROBNP in the last 8760 hours. HbA1C: No results for input(s): HGBA1C in the last 72 hours. CBG: Recent Labs  Lab 02/06/21 0001 02/06/21 0500 02/06/21 0741 02/06/21 1155 02/06/21 1647  GLUCAP 71 86 65* 73 80      Radiologic Exams on Admission:   DG Chest Port 1 View  Result Date: 02/12/2021 CLINICAL DATA:  Shortness of breath. EXAM: PORTABLE CHEST 1 VIEW COMPARISON:  February 04, 2021. FINDINGS: Stable cardiomegaly. Mild central pulmonary vascular congestion is noted with probable minimal bilateral pulmonary edema. No consolidative process is noted. Bony thorax is unremarkable. IMPRESSION: Mild cardiomegaly with central pulmonary vascular congestion and probable minimal bilateral pulmonary edema. Electronically Signed   By: Marijo Conception M.D.   On: 02/12/2021 10:29   DG Foot Complete Left  Result Date: 02/12/2021 CLINICAL DATA:  Infection at the first metatarsophalangeal joint. 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 Osteotomy changes of the proximal phalanx of the third digit. Soft tissue swelling and vascular calcifications. No cortical erosion or periosteal reaction concerning for osteomyelitis. IMPRESSION: 1. Postsurgical changes as detailed above. 2. No radiographic evidence of osteomyelitis, if there is a clinical concern, MRI examination could be obtained for further  evaluation. Electronically Signed   By: Keane Police D.O.   On: 02/12/2021 10:28    EKG:   Independently reviewed.  Orders placed or performed during the hospital encounter of 02/12/21   ED EKG   ED EKG   EKG 12-Lead   EKG 12-Lead   EKG 12-Lead   ---------------------------------------------------------------------------------------------------------------------------------------    Assessment / Plan:   Principal Problem:   Hyperkalemia Active Problems:   ESRD on hemodialysis (HCC)   Peripheral vascular disease (HCC)   Type 2 diabetes mellitus with diabetic chronic kidney disease (HCC)   Hyperlipidemia   S/P below knee amputation, right (HCC)   Hypocalcemia   Chronic atrial fibrillation (HCC)   Acute on chronic combined systolic and diastolic CHF (congestive heart failure) (HCC)   Pain in limb   Left hemiparesis (HCC)   Volume overload   Principal Problem:   Hyperkalemia/with end-stage renal disease on hemodialysis -Patient will be admitted to [facility] telemetry  -Missed hemodialysis Monday Wednesday and today -In ED has received Lokelma, IV calcium gluconate, D50 insulin, albuterol -Nephrologist Dr. Marval Regal has been consulted for urgent hemodialysis  Severe peripheral vascular disease -pain discoloration left lower extremity -With a history of left toe amputation, right AKA -Left lower extremity/foot -gangrenous changes on the left great toe, on a previous amputation site, poor pulse on her left lower extremity -Vascular surgery Dr. Quentin Cornwall at [facility] was consulted -Recommended CT angiogram of the lower extremity, and transfer to [facility] for further evaluation -Patient's home medication of Eliquis on hold, started heparin per vascular recommendation -Continue aspirin, statins   Recent history of MRSA bacteremia -Discharged from [facility] on 02/06/21 with MRSA bacteremia from left leg cellulitis and was started on IV vancomycin for 6 weeks (negative TTE for  vegetation). -Vancomycin with hemodialysis, -Repeated blood cultures today 02/12/2021  Active Problems:  Type 2 diabetes mellitus with diabetic chronic kidney disease ([place]) -We will check CBG q. ACH S, SSI coverage  Hyperlipidemia -Continue statin  S/P below knee amputation, right (HCC) -Stable  Hypocalcemia/  -We will continue calcium supplement (calcium carbonate, calcium acetate)  Chronic atrial fibrillation (HCC) -Continue Coreg, -On Eliquis-on hold now on heparin drip    Acute on chronic combined systolic and diastolic CHF (congestive heart failure) (HCC) With volume overload -Hemodialysis today, monitoring daily weights    Left hemiparesis (HCC)  -Stable continue aspirin, statins,  History of anemia, anemia of chronic disease due to CKD/ ESRD -Monitoring H&H, stable  Cultures:  -Blood Cx  Antimicrobial: -IV vancomycin with hemodialysis  Consults called: Vascular surgery Dr. Quentin Cornwall, nephrology -------------------------------------------------------------------------------------------------------------------------------------------- DVT prophylaxis: Heparin drip (holding home medication of Eliquis Code Status:   Code Status: Full Code   Admission status: Patient will be admitted as Inpatient, with a greater than 2 midnight length of stay. Level of care: Telemetry Medical   Family Communication:  none at bedside  (The above findings and plan of care has been discussed with patient in detail, the patient expressed understanding and agreement of above plan)  --------------------------------------------------------------------------------------------------------------------------------------------------  Disposition Plan: >3 days Status is: Inpatient  Remains inpatient appropriate because: Needing inpatient dialysis treatment for hyperkalemia, work-up for severe peripheral vascular disease input from consultant vascular surgery  team  --------------------------------------------------------------------------------------------------------------------------------------------  Time spent: > than  25  Min.   SIGNED: Deatra Ashtian, MD, FHM. [facility],  Pager (Please use [email or web address] to page to text)  If 7PM-7AM, please contact night-coverage [email or web address],  02/12/2021, 12:50 PM

## 2021-02-12 NOTE — Progress Notes (Signed)
ANTICOAGULATION CONSULT NOTE - Follow Up Consult  Pharmacy Consult for Heparin Indication:  Ischemic LE  Allergies  Allergen Reactions   Tape Other (See Comments)    Pulls skin off    Patient Measurements: Height: 5\' 9"  (175.3 cm) Weight: 93.4 kg (205 lb 14.6 oz) IBW/kg (Calculated) : 70.7 Heparin Dosing Weight:    Vital Signs: Temp: 97.9 F (36.6 C) (01/06 1520) Temp Source: Oral (01/06 1520) BP: 116/66 (01/06 1930) Pulse Rate: 108 (01/06 2045)  Labs: Recent Labs    02/12/21 1005 02/12/21 1234 02/12/21 2046  HGB 11.3*  --   --   HCT 35.2*  --   --   PLT 233  --   --   APTT  --  35 89*  LABPROT 16.9*  --   --   INR 1.4*  --   --   HEPARINUNFRC <0.10*  --   --   CREATININE 8.89*  --   --     Estimated Creatinine Clearance: 11.3 mL/min (A) (by C-G formula based on SCr of 8.89 mg/dL (H)).   Assessment: Anticoag: eliquis PTA, last dose 02/10/21 at 1700 (INR 1.4) . LLE ischemia. aPTT 89 in goal.   Goal of Therapy:  aPTT 66-102 seconds Monitor platelets by anticoagulation protocol: Yes   Plan:  Con't heparin infusion at 1500 units/hr Daily apTT, HL, and CBC   Rosamary Boudreau S. Alford Highland, PharmD, BCPS Clinical Staff Pharmacist [email or web address]  Alford Highland, [place] Betke Stillinger 02/12/2021,9:26 PM

## 2021-02-12 NOTE — ED Notes (Signed)
Report given to carelink, ETA 45-50 minutes

## 2021-02-12 NOTE — Progress Notes (Signed)
MD paged MEWS yellow HR 104

## 2021-02-12 NOTE — ED Triage Notes (Signed)
Pt arrived via [facility] w c/o weakness. Pt did not go to dialysis Wednesday.

## 2021-02-12 NOTE — Progress Notes (Signed)
Pharmacy Antibiotic Note  Brendan Campbell is a 51 y.o. male admitted on 02/12/2021 with bacteremia.  Pharmacy has been consulted for Vancomycin dosing.  ESRD on HD MWF, 1 of 2 blood cx + MRSA identified on 02/04/21 and ID suggested 6 weeiks of empiric tx . Patient had HD on Monday but no Vancomycin was given per Dr. Marval Regal. Patient dialyzing now, then will give Vancomycin   Plan: Continue Vancomycin 1gm IV after each HD on MWF Monitor V/S, labs and levels as indicated.  Height: 5\' 9"  (175.3 cm) Weight: 93.4 kg (205 lb 14.6 oz) IBW/kg (Calculated) : 70.7  Temp (24hrs), Avg:97.5 F (36.4 C), Min:97.5 F (36.4 C), Max:97.5 F (36.4 C)  Recent Labs  Lab 02/12/21 1005 02/12/21 1006  WBC 8.3  --   CREATININE 8.89*  --   LATICACIDVEN  --  2.3*    Estimated Creatinine Clearance: 11.3 mL/min (A) (by C-G formula based on SCr of 8.89 mg/dL (H)).    Allergies  Allergen Reactions   Tape Other (See Comments)    Pulls skin off    Antimicrobials this admission: Vancomycin 1/6 >> PTA  Microbiology results: 1/6 BCx: pending  MRSA PCR:   Thank you for allowing pharmacy to be a part of this patients care.  Isac Sarna, BS Vena [place], [place] Clinical Pharmacist Pager [phone] 02/12/2021 12:50 PM

## 2021-02-12 NOTE — Consult Note (Signed)
[facility] Renal Consultation Note    Indication for Consultation:  Management of ESRD/hemodialysis; anemia, hypertension/volume and secondary hyperparathyroidism  HPI: Brendan Campbell is a 51 y.o. male with a PMH significant for DM type 2, HTN, chronic systolic CHF, HLD, PAD, anxiety, depression, medical noncompliance, and ESRD on HD MWF at [facility] who was recently discharged from [facility] on 02/06/21 with MRSA bacteremia from left leg cellulitis and was started on IV vancomycin for 6 weeks (negative TTE for vegetation).   He was discharged to home and did go to outpatient HD on Monday but he did not get the IV vancomycin.  He did not go to HD on Wednesday (which he normally skips) and was brought to [facility] ED via EMS due to weakness.  He reports that he was weak and fell so couldn't go to dialysis on Wednesday or today.  In the ED, Temp 97.5, vital signs stable, labs were  notable for K of >7.5, BUN 76, alb 3.3, lactate 2.3, INR 1.4.  We were consulted to provide urgent dialysis for his lifethreatening hyperkalemia.  He was given IV calcium, insulin/D50, albuterol, and lokelma 10 grams.  Past Medical History:  Diagnosis Date   Anemia    Anxiety    Blood transfusion without reported diagnosis    CHF (congestive heart failure) ([place])    a. EF 25% by echo in 07/2019   Chronic kidney disease    Depression    Diabetes mellitus without complication ([place])    End stage renal disease ([place])    M/W/F [facility]   Hyperlipidemia    Neuropathy    Peripheral vascular disease (HCC)    PTSD (post-traumatic stress disorder)    Past Surgical History:  Procedure Laterality Date   A/V FISTULAGRAM N/A 10/09/2018   Procedure: A/V FISTULAGRAM;  Surgeon: Serafina Mitchell, MD;  Location: [place] [facility];  Service: Cardiovascular;  Laterality: N/A;   AV FISTULA PLACEMENT Left 09/22/2016   Procedure: CREATION OF LEFT ARM ARTERIOVENOUS (AV) FISTULA;  Surgeon: Waynetta Sandy, MD;  Location:  [place];  Service: Vascular;  Laterality: Left;   AV FISTULA PLACEMENT Left 10/31/2017   Procedure: INSERTION OF ARTERIOVENOUS (AV) GORE-TEX 4-49mm STETCH GRAFT LEFT ARM;  Surgeon: Waynetta Sandy, MD;  Location: [place];  Service: Vascular;  Laterality: Left;   [place] Left 08/17/2017   Procedure: SECOND STAGE BASILIC VEIN TRANSPOSITION LEFT ARM;  Surgeon: Waynetta Sandy, MD;  Location: [place];  Service: Vascular;  Laterality: Left;   BELOW KNEE LEG AMPUTATION Right    CARDIOVERSION N/A 02/19/2019   Procedure: CARDIOVERSION;  Surgeon: [name], MD;  Location: [place];  Service: Cardiovascular;  Laterality: N/A;   CATARACT EXTRACTION W/PHACO Right 10/23/2020   Procedure: CATARACT EXTRACTION PHACO AND INTRAOCULAR LENS PLACEMENT ([place]);  Surgeon: Baruch Goldmann, MD;  Location: [facility];  Service: Ophthalmology;  Laterality: Right;  CDE 39.03   CHOLECYSTECTOMY     FOOT SURGERY     IR FLUORO GUIDE CV LINE RIGHT  05/16/2016   IR FLUORO GUIDE CV LINE RIGHT  02/16/2019   IR REMOVAL TUN CV CATH W/O FL  05/16/2016   IR REMOVAL TUN CV CATH W/O FL  02/19/2019   IR THROMBECTOMY AV FISTULA W/THROMBOLYSIS/PTA INC/SHUNT/IMG LEFT Left 11/09/2018   IR THROMBECTOMY AV FISTULA W/THROMBOLYSIS/PTA INC/SHUNT/IMG LEFT Left 06/22/2019   IR US GUIDE VASC ACCESS LEFT  11/09/2018   IR US GUIDE VASC ACCESS LEFT  06/22/2019   IR  US GUIDE VASC ACCESS RIGHT  05/16/2016   IR US GUIDE VASC ACCESS RIGHT  02/16/2019   PERIPHERAL VASCULAR BALLOON ANGIOPLASTY  10/09/2018   Procedure: PERIPHERAL VASCULAR BALLOON ANGIOPLASTY;  Surgeon: Serafina Mitchell, MD;  Location: [place] [facility];  Service: Cardiovascular;;   TEE WITHOUT CARDIOVERSION N/A 02/19/2019   Procedure: TRANSESOPHAGEAL ECHOCARDIOGRAM (TEE);  Surgeon: [name], MD;  Location: [facility] ENDOSCOPY;  Service: Cardiovascular;  Laterality: N/A;   Family History:   Family History  Problem Relation Age of Onset   Cancer Mother        lung    Diabetes Mother    Heart attack Father    Diabetes Father    Diabetes Sister    Social History:  reports that he has been smoking. He has never used smokeless tobacco. He reports that he does not drink alcohol and does not use drugs. Allergies  Allergen Reactions   Tape Other (See Comments)    Pulls skin off   Prior to Admission medications   Medication Sig Start Date End Date Taking? Authorizing Provider  acetaminophen (TYLENOL) 325 MG tablet Take 2 tablets (650 mg total) by mouth every 6 (six) hours as needed for mild pain, moderate pain or fever. 02/06/21  Yes Cherene Altes, MD  albuterol (VENTOLIN HFA) 108 (90 Base) MCG/ACT inhaler Inhale 2 puffs into the lungs every 4 (four) hours as needed for wheezing or shortness of breath. 07/21/20  Yes Manuella Ghazi, Pratik D, DO  apixaban (ELIQUIS) 5 MG TABS tablet Take 1 tablet (5 mg total) by mouth 2 (two) times daily. 08/23/19  Yes Strader, Fransisco Hertz, PA-C  aspirin 81 MG chewable tablet Chew 81 mg by mouth daily.   Yes [provider]  atorvastatin (LIPITOR) 10 MG tablet Take 1 tablet (10 mg total) by mouth every evening. Patient taking differently: Take 10 mg by mouth every evening. Take 1 tablet on Monday, Wednesday and Friday. 08/03/19  Yes Barton Dubois, MD  butalbital-acetaminophen-caffeine (FIORICET) [phone] MG tablet Take 1 tablet by mouth every 6 (six) hours as needed for headache or migraine. 07/21/20  Yes Shah, Pratik D, DO  calcium acetate (PHOSLO) 667 MG capsule Take 1 capsule (667 mg total) by mouth 3 (three) times daily with meals. 02/21/19  Yes Donnamae Jude, MD  calcium carbonate (TUMS - DOSED IN MG ELEMENTAL CALCIUM) 500 MG chewable tablet Chew 4 tablets (800 mg of elemental calcium total) by mouth 2 (two) times daily. 08/03/19  Yes Barton Dubois, MD  Cholecalciferol (VITAMIN D3) 25 MCG (1000 UT) CAPS Take 1 capsule by mouth See admin instructions. Take before Dialysis on Monday Wednesday and Fridays   Yes [provider]  oxyCODONE (OXY IR/ROXICODONE) 5 MG immediate release tablet Take 1 tablet (5 mg total) by mouth every 6 (six) hours as needed for severe pain. 02/06/21  Yes Cherene Altes, MD  pantoprazole (PROTONIX) 20 MG tablet Take 1 tablet (20 mg total) by mouth daily. 11/26/20  Yes Milton Ferguson, MD  vancomycin (VANCOCIN) 1-5 GM/200ML-% SOLN Inject 200 mLs (1,000 mg total) into the vein every Monday, Wednesday, and Friday with hemodialysis. 02/08/21 03/22/21 Yes Cherene Altes, MD  carvedilol (COREG) 3.125 MG tablet Take 1 tablet (3.125 mg total) by mouth 2 (two) times daily with a meal. Patient not taking: Reported on 12/07/2020 04/09/20 11/26/20  Deatra Deen, MD  patiromer (VELTASSA) 8.4 g packet Take 1 packet (8.4 g total) by mouth 3 (three) times a week. On Tuesday,  Thursday and Saturday Patient not taking: Reported on 02/12/2021 06/10/19   Kathie Dike, MD   Current Facility-Administered Medications  Medication Dose Route Frequency Provider Last Rate Last Admin   Chlorhexidine Gluconate Cloth 2 % PADS 6 each  6 each Topical Q0600 Donato Heinz, MD       sodium chloride 0.9 % bolus 500 mL  500 mL Intravenous Once Marcello Fennel, PA-C       sodium zirconium cyclosilicate (LOKELMA) packet 10 g  10 g Oral Once Marcello Fennel, PA-C       vancomycin (VANCOCIN) IVPB 1000 mg/200 mL premix  1,000 mg Intravenous Q M,W,F-HD Wyvonnia Dusky, MD       Current Outpatient Medications  Medication Sig Dispense Refill   acetaminophen (TYLENOL) 325 MG tablet Take 2 tablets (650 mg total) by mouth every 6 (six) hours as needed for mild pain, moderate pain or fever.     albuterol (VENTOLIN HFA) 108 (90 Base) MCG/ACT inhaler Inhale 2 puffs into the lungs every 4 (four) hours as needed for wheezing or shortness of breath. 8 g 1   apixaban (ELIQUIS) 5 MG TABS tablet Take 1 tablet (5 mg total) by mouth 2 (two) times daily. 180 tablet 3   aspirin 81 MG chewable tablet Chew 81 mg by  mouth daily.     atorvastatin (LIPITOR) 10 MG tablet Take 1 tablet (10 mg total) by mouth every evening. (Patient taking differently: Take 10 mg by mouth every evening. Take 1 tablet on Monday, Wednesday and Friday.) 90 tablet 2   butalbital-acetaminophen-caffeine (FIORICET) 50-325-40 MG tablet Take 1 tablet by mouth every 6 (six) hours as needed for headache or migraine. 14 tablet 0   calcium acetate (PHOSLO) 667 MG capsule Take 1 capsule (667 mg total) by mouth 3 (three) times daily with meals. 90 capsule 2   calcium carbonate (TUMS - DOSED IN MG ELEMENTAL CALCIUM) 500 MG chewable tablet Chew 4 tablets (800 mg of elemental calcium total) by mouth 2 (two) times daily. 60 tablet 2   Cholecalciferol (VITAMIN D3) 25 MCG (1000 UT) CAPS Take 1 capsule by mouth See admin instructions. Take before Dialysis on Monday Wednesday and Fridays     oxyCODONE (OXY IR/ROXICODONE) 5 MG immediate release tablet Take 1 tablet (5 mg total) by mouth every 6 (six) hours as needed for severe pain. 30 tablet 0   pantoprazole (PROTONIX) 20 MG tablet Take 1 tablet (20 mg total) by mouth daily. 30 tablet 1   vancomycin (VANCOCIN) 1-5 GM/200ML-% SOLN Inject 200 mLs (1,000 mg total) into the vein every Monday, Wednesday, and Friday with hemodialysis. 3600 mL 0   carvedilol (COREG) 3.125 MG tablet Take 1 tablet (3.125 mg total) by mouth 2 (two) times daily with a meal. (Patient not taking: Reported on 12/07/2020) 180 tablet 3   patiromer (VELTASSA) 8.4 g packet Take 1 packet (8.4 g total) by mouth 3 (three) times a week. On Tuesday, Thursday and Saturday (Patient not taking: Reported on 02/12/2021) 30 each 0   Labs: Basic Metabolic Panel: Recent Labs  Lab 02/12/21 1005  NA 135  K >7.5*  CL 95*  CO2 22  GLUCOSE 92  BUN 76*  CREATININE 8.89*  CALCIUM 8.9   Liver Function Tests: Recent Labs  Lab 02/12/21 1005  AST 17  ALT 12  ALKPHOS 83  BILITOT 1.0  PROT 7.1  ALBUMIN 3.3*   No results for input(s): LIPASE,  AMYLASE in the last 168 hours. No results for  input(s): AMMONIA in the last 168 hours. CBC: Recent Labs  Lab 02/12/21 1005  WBC 8.3  NEUTROABS 5.7  HGB 11.3*  HCT 35.2*  MCV 108.3*  PLT 233   Cardiac Enzymes: No results for input(s): CKTOTAL, CKMB, CKMBINDEX, TROPONINI in the last 168 hours. CBG: Recent Labs  Lab 02/06/21 0001 02/06/21 0500 02/06/21 0741 02/06/21 1155 02/06/21 1647  GLUCAP 71 86 65* 73 80   Iron Studies: No results for input(s): IRON, TIBC, TRANSFERRIN, FERRITIN in the last 72 hours. Studies/Results: DG Chest Port 1 View  Result Date: 02/12/2021 CLINICAL DATA:  Shortness of breath. EXAM: PORTABLE CHEST 1 VIEW COMPARISON:  February 04, 2021. FINDINGS: Stable cardiomegaly. Mild central pulmonary vascular congestion is noted with probable minimal bilateral pulmonary edema. No consolidative process is noted. Bony thorax is unremarkable. IMPRESSION: Mild cardiomegaly with central pulmonary vascular congestion and probable minimal bilateral pulmonary edema. Electronically Signed   By: Marijo Conception M.D.   On: 02/12/2021 10:29   DG Foot Complete Left  Result Date: 02/12/2021 CLINICAL DATA:  Infection at the first metatarsophalangeal joint. 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 Osteotomy changes of the proximal phalanx of the third digit. Soft tissue swelling and vascular calcifications. No cortical erosion or periosteal reaction concerning for osteomyelitis. IMPRESSION: 1. Postsurgical changes as detailed above. 2. No radiographic evidence of osteomyelitis, if there is a clinical concern, MRI examination could be obtained for further evaluation. Electronically Signed   By: Keane Police D.O.   On: 02/12/2021 10:28    ROS: Pertinent items are noted in HPI. Physical Exam: Vitals:   02/12/21 0845 02/12/21 0849 02/12/21 1015  BP:   114/84 104/78  Pulse:  90 88  Resp:  17 (!) 26  Temp:  (!) 97.5 F (36.4 C)   TempSrc:  Oral   SpO2:  100% 97%  Weight: 93.4 kg    Height: 5\' 9"  (1.753 m)        Weight change:  No intake or output data in the 24 hours ending 02/12/21 1121 BP 104/78    Pulse 88    Temp (!) 97.5 F (36.4 C) (Oral)    Resp (!) 26    Ht 5\' 9"  (1.753 m)    Wt 93.4 kg    SpO2 97%    BMI 30.41 kg/m  General appearance: fatigued and no distress Head: Normocephalic, without obvious abnormality, atraumatic Resp: clear to auscultation bilaterally Cardio: regular rate and rhythm, S1, S2 normal, no murmur, click, rub or gallop GI: soft, non-tender; bowel sounds normal; no masses,  no organomegaly Extremities: s/p right BKA with excoriations and erythema on stump, left foot with dark eschar over left great toe amputation site with some redness, 1+ edema  Dialysis Access:  Dialysis Orders:  Center: [facility]  on MWF  4h 6min  95 kg   1K/2.25 bath  Hep 1000+ 500u/hr  LUE AVG  300/500   - prosthetic weighs 1.6 kg  - Epogen 1000   Units IV/HD    Assessment/Plan:  Hyperkalemia - due to noncompliance with HD.  Will plan for urgent HD.  He has presented with this multiple times.  Given IV calcium, insulin/D50, lokelma, and albuterol.  Plan for low K dialysis.  MRSA bacteremia - has not had IV vancomycin in a week.  Resume IV vanc and consult ID.    PAD -  appears to have some gangrenous changes of his left great toe amputation site.  Will likely need vascular surgery evaluation or possible amputation.   ESRD -  as above, continue with MWF schedule  Hypertension/volume  - stable  Anemia  - stable  Metabolic bone disease -   resume home meds  Nutrition - renal diet, carb modified.  Donetta Potts, MD [place] Pager [phone] 02/12/2021, 11:21 AM

## 2021-02-12 NOTE — ED Provider Notes (Signed)
[facility] Provider Note   CSN: [id number] Arrival date & time: 02/12/21  2671     History  No chief complaint on file.   Brendan Campbell is a 51 y.o. male.  HPI  Patient with medical history including end-stage renal disease on dialysis Monday Wednesday Friday congestive heart failure with an EF of 20 to 25% recent MRSA bacteremia, right leg above-the-knee amputation presents emergency department chief complaint of weakness.  Patient states he has been feeling weak since he was discharged to the hospital on Saturday.  He states that he initially noticed this on Monday, and  has worsen.  He states that he feels weak in his upper and lower extremities, its not unilateral, denies any headaches, change in vision, paresthesias in the upper or lower extremities, states that he misses dialysis treatment on Wednesday but got his full treatment on Monday, he endorses that he was supposed to be taking vancomycin via dialysis but has not received any since his discharge on Saturday.  He also notes that his left foot is becoming more discolored.  He states that he sees some black coming around the previously amputated great toe.  This started on Tuesday.  States he has worsening pain in his foot, states pain is all over his foot ranged with redness.  He has no associate fevers, chills, chest pain, shortness of breath, stomach pains, nausea, vomit, diarrhea, denies any peripheral edema or orthopnea.  He has no other complaints at this time.  After reviewing patient's chart patient recent discharge from the hospital diagnosed with MRSA bacteremia on vancomycin, Zosyn take this for the next 6 weeks time.  Home Medications Prior to Admission medications   Medication Sig Start Date End Date Taking? Authorizing Provider  acetaminophen (TYLENOL) 325 MG tablet Take 2 tablets (650 mg total) by mouth every 6 (six) hours as needed for mild pain, moderate pain or fever. 02/06/21  Yes Cherene Altes, MD  albuterol (VENTOLIN HFA) 108 (90 Base) MCG/ACT inhaler Inhale 2 puffs into the lungs every 4 (four) hours as needed for wheezing or shortness of breath. 07/21/20  Yes Manuella Ghazi, Pratik D, DO  apixaban (ELIQUIS) 5 MG TABS tablet Take 1 tablet (5 mg total) by mouth 2 (two) times daily. 08/23/19  Yes Strader, Fransisco Hertz, PA-C  aspirin 81 MG chewable tablet Chew 81 mg by mouth daily.   Yes [provider]  atorvastatin (LIPITOR) 10 MG tablet Take 1 tablet (10 mg total) by mouth every evening. Patient taking differently: Take 10 mg by mouth every evening. Take 1 tablet on Monday, Wednesday and Friday. 08/03/19  Yes Barton Dubois, MD  butalbital-acetaminophen-caffeine (FIORICET) [phone] MG tablet Take 1 tablet by mouth every 6 (six) hours as needed for headache or migraine. 07/21/20  Yes Shah, Pratik D, DO  calcium acetate (PHOSLO) 667 MG capsule Take 1 capsule (667 mg total) by mouth 3 (three) times daily with meals. 02/21/19  Yes Donnamae Jude, MD  calcium carbonate (TUMS - DOSED IN MG ELEMENTAL CALCIUM) 500 MG chewable tablet Chew 4 tablets (800 mg of elemental calcium total) by mouth 2 (two) times daily. 08/03/19  Yes Barton Dubois, MD  Cholecalciferol (VITAMIN D3) 25 MCG (1000 UT) CAPS Take 1 capsule by mouth See admin instructions. Take before Dialysis on Monday Wednesday and Fridays   Yes [provider]  oxyCODONE (OXY IR/ROXICODONE) 5 MG immediate release tablet Take 1 tablet (5 mg total) by mouth every 6 (six) hours as needed  for severe pain. 02/06/21  Yes Cherene Altes, MD  pantoprazole (PROTONIX) 20 MG tablet Take 1 tablet (20 mg total) by mouth daily. 11/26/20  Yes Milton Ferguson, MD  vancomycin (VANCOCIN) 1-5 GM/200ML-% SOLN Inject 200 mLs (1,000 mg total) into the vein every Monday, Wednesday, and Friday with hemodialysis. 02/08/21 03/22/21 Yes Cherene Altes, MD  carvedilol (COREG) 3.125 MG tablet Take 1 tablet (3.125 mg total) by mouth 2 (two) times daily with  a meal. Patient not taking: Reported on 12/07/2020 04/09/20 11/26/20  Deatra Hakan, MD  patiromer (VELTASSA) 8.4 g packet Take 1 packet (8.4 g total) by mouth 3 (three) times a week. On Tuesday, Thursday and Saturday Patient not taking: Reported on 02/12/2021 06/10/19   Kathie Dike, MD      Allergies    Tape    Review of Systems   Review of Systems  Constitutional:  Negative for chills and fever.  HENT:  Negative for congestion.   Respiratory:  Negative for shortness of breath.   Cardiovascular:  Negative for chest pain.  Gastrointestinal:  Negative for abdominal pain.  Genitourinary:  Negative for enuresis.  Musculoskeletal:  Negative for back pain.  Skin:  Positive for wound. Negative for rash.  Neurological:  Positive for weakness. Negative for dizziness.  Hematological:  Does not bruise/bleed easily.   Physical Exam Updated Vital Signs BP 117/90    Pulse 90    Temp (!) 97.5 F (36.4 C) (Oral)    Resp 20    Ht 5\' 9"  (1.753 m)    Wt 93.4 kg    SpO2 99%    BMI 30.41 kg/m  Physical Exam Vitals and nursing note reviewed.  Constitutional:      General: He is not in acute distress.    Appearance: He is not ill-appearing.  HENT:     Head: Normocephalic and atraumatic.     Nose: No congestion.  Eyes:     Conjunctiva/sclera: Conjunctivae normal.  Cardiovascular:     Rate and Rhythm: Normal rate and regular rhythm.     Pulses: Normal pulses.     Heart sounds: No murmur heard.   No friction rub. No gallop.  Pulmonary:     Effort: No respiratory distress.     Breath sounds: No wheezing or rhonchi.     Comments: Slight bibasilar rales heard in the lower lobes. Abdominal:     Palpations: Abdomen is soft.     Tenderness: There is no abdominal tenderness. There is no right CVA tenderness or left CVA tenderness.  Musculoskeletal:     Comments: Right leg amputee above-the-knee, some chronic wounds noted on the stump of the right leg appears to be healing well no signs infection  present.  Left leg was visualized, left foot appears to be mottled, cool to the touch, unable to palpate a pedal pulse, he has was appears to be a ischemic wound at the first MTP joint, able to move all other toes without difficulty.  Please see picture for full detail.  Skin:    General: Skin is warm and dry.  Neurological:     Mental Status: He is alert.  Psychiatric:        Mood and Affect: Mood normal.         ED Results / Procedures / Treatments   Labs (all labs ordered are listed, but only abnormal results are displayed) Labs Reviewed  COMPREHENSIVE METABOLIC PANEL - Abnormal; Notable for the following components:  Result Value   Potassium >7.5 (*)    Chloride 95 (*)    BUN 76 (*)    Creatinine, Ser 8.89 (*)    Albumin 3.3 (*)    GFR, Estimated 7 (*)    Anion gap 18 (*)    All other components within normal limits  CBC WITH DIFFERENTIAL/PLATELET - Abnormal; Notable for the following components:   RBC 3.25 (*)    Hemoglobin 11.3 (*)    HCT 35.2 (*)    MCV 108.3 (*)    MCH 34.8 (*)    RDW 18.5 (*)    Eosinophils Absolute 0.6 (*)    Abs Immature Granulocytes 0.09 (*)    All other components within normal limits  LACTIC ACID, PLASMA - Abnormal; Notable for the following components:   Lactic Acid, Venous 2.3 (*)    All other components within normal limits  PROTIME-INR - Abnormal; Notable for the following components:   Prothrombin Time 16.9 (*)    INR 1.4 (*)    All other components within normal limits  LACTIC ACID, PLASMA - Abnormal; Notable for the following components:   Lactic Acid, Venous 2.7 (*)    All other components within normal limits  HEPARIN LEVEL (UNFRACTIONATED) - Abnormal; Notable for the following components:   Heparin Unfractionated <0.10 (*)    All other components within normal limits  RESP PANEL BY RT-PCR (FLU A&B, COVID) ARPGX2  CULTURE, BLOOD (ROUTINE X 2)  CULTURE, BLOOD (ROUTINE X 2)  MAGNESIUM  APTT  APTT  BRAIN NATRIURETIC  PEPTIDE    EKG EKG Interpretation  Date/Time:  Friday February 12 2021 10:12:51 EST Ventricular Rate:  88 PR Interval:  235 QRS Duration: 181 QT Interval:  428 QTC Calculation: 518 R Axis:   -42 Text Interpretation: Sinus rhythm Prolonged PR interval Nonspecific IVCD with LAD Borderline T abnormalities, lateral leads Confirmed by Octaviano Glow [phone]) on 02/12/2021 10:47:05 AM  Radiology DG Chest Port 1 View  Result Date: 02/12/2021 CLINICAL DATA:  Shortness of breath. EXAM: PORTABLE CHEST 1 VIEW COMPARISON:  February 04, 2021. FINDINGS: Stable cardiomegaly. Mild central pulmonary vascular congestion is noted with probable minimal bilateral pulmonary edema. No consolidative process is noted. Bony thorax is unremarkable. IMPRESSION: Mild cardiomegaly with central pulmonary vascular congestion and probable minimal bilateral pulmonary edema. Electronically Signed   By: Marijo Conception M.D.   On: 02/12/2021 10:29   DG Foot Complete Left  Result Date: 02/12/2021 CLINICAL DATA:  Infection at the first metatarsophalangeal joint. 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 Osteotomy changes of the proximal phalanx of the third digit. Soft tissue swelling and vascular calcifications. No cortical erosion or periosteal reaction concerning for osteomyelitis. IMPRESSION: 1. Postsurgical changes as detailed above. 2. No radiographic evidence of osteomyelitis, if there is a clinical concern, MRI examination could be obtained for further evaluation. Electronically Signed   By: Keane Police D.O.   On: 02/12/2021 10:28    Procedures .Critical Care Performed by: Marcello Fennel, PA-C Authorized by: Marcello Fennel, PA-C   Critical care provider statement:    Critical care time (minutes):  30   Critical care time was exclusive of:  Separately billable procedures and  treating other patients   Critical care was necessary to treat or prevent imminent or life-threatening deterioration of the following conditions:  Metabolic crisis and circulatory failure  Critical care was time spent personally by me on the following activities:  Development of treatment plan with patient or surrogate, discussions with consultants, evaluation of patient's response to treatment, examination of patient, ordering and review of laboratory studies, ordering and review of radiographic studies, ordering and performing treatments and interventions, pulse oximetry, re-evaluation of patient's condition and review of old charts   I assumed direction of critical care for this patient from another provider in my specialty: no     Care discussed with: admitting provider      Medications Ordered in ED Medications  Chlorhexidine Gluconate Cloth 2 % PADS 6 each (has no administration in time range)  vancomycin (VANCOCIN) IVPB 1000 mg/200 mL premix (has no administration in time range)  heparin ADULT infusion 100 units/mL (25000 units/213mL) (1,500 Units/hr Intravenous New Bag/Given 02/12/21 1204)  sodium chloride flush (NS) 0.9 % injection 3 mL (has no administration in time range)  0.9 %  sodium chloride infusion ( Intravenous New Bag/Given 02/12/21 1314)  sodium chloride flush (NS) 0.9 % injection 3 mL (has no administration in time range)  sodium chloride flush (NS) 0.9 % injection 3 mL (has no administration in time range)  0.9 %  sodium chloride infusion (has no administration in time range)  acetaminophen (TYLENOL) tablet 650 mg (has no administration in time range)    Or  acetaminophen (TYLENOL) suppository 650 mg (has no administration in time range)  oxyCODONE (Oxy IR/ROXICODONE) immediate release tablet 5 mg (has no administration in time range)  HYDROmorphone (DILAUDID) injection 0.5-1 mg (has no administration in time range)  zolpidem (AMBIEN) tablet 5 mg (has no administration in  time range)  senna-docusate (Senokot-S) tablet 1 tablet (has no administration in time range)  bisacodyl (DULCOLAX) EC tablet 5 mg (has no administration in time range)  ipratropium (ATROVENT) nebulizer solution 0.5 mg (has no administration in time range)  hydrALAZINE (APRESOLINE) injection 10 mg (has no administration in time range)  sodium zirconium cyclosilicate (LOKELMA) packet 10 g (10 g Oral Given 02/12/21 1130)  calcium gluconate inj 10% (1 g) URGENT USE ONLY! (1 g Intravenous Given 02/12/21 1112)  albuterol (PROVENTIL) (2.5 MG/3ML) 0.083% nebulizer solution 10 mg (10 mg Nebulization Given 02/12/21 1121)  insulin aspart (novoLOG) injection 5 Units (5 Units Intravenous Given 02/12/21 1111)    And  dextrose 50 % solution 50 mL (50 mLs Intravenous Given 02/12/21 1109)  sodium chloride 0.9 % bolus 500 mL (500 mLs Intravenous Bolus 02/12/21 1138)  HYDROmorphone (DILAUDID) injection 0.5 mg (0.5 mg Intravenous Given 02/12/21 1319)    ED Course/ Medical Decision Making/ A&P Clinical Course as of 02/12/21 1328  Fri Feb 12, 2021  1047 This is a 51 year old male with a history of right lower extremity amputation, left toe amputations, diabetes, and stage renal disease on dialysis, recent hospitalization and discharged 1 week ago for MRSA bacteremia, presenting back to the ED with complaint of generalized fatigue, weakness pain in his left foot.  The patient reports that he did go to dialysis on Monday but missed 1 session on Wednesday because he was not feeling well.  He states he was supposed to be scheduled to get his vancomycin at dialysis, but did not receive that on Monday.  He has not had antibiotic since leaving the hospital.  He denies fevers or chills but she feels generally fatigued and has low energy.  He reports no discoloration around the site of his left toe amputation, which is new from hospitalization.  On exam  he is afebrile, stable vital signs, stable on room air.  He is not in acute respiratory  distress but does have some congestion and crackles on pulmonary exam.  Labs are notable for hyperkalemia with a potassium greater than 7.5, BUN and creatinine elevated consistent with his end-stage renal disease.  White blood cell count within normal limits.  Lactate 2.3.  We have ordered hyperkalemia medications.  His EKG per my interpretation shows a chronic left bundle branch block pattern, no otherwise evident QRS widening, but his high potassium remains concerning.  We discussed this case with nephrology.  The patient also has concern for poor circulation to the left lower extremity, absent pedal pulse - and will need admission. Repeat blood cx ordered. [MT]  1146 Lactic Acid, Venous(!!): 2.3 [WF]    Clinical Course User Index [MT] Trifan, Carola Rhine, MD [WF] Marcello Fennel, PA-C                           Medical Decision Making  This patient presents to the ED for concern of weakness and worsening of left foot wound, this involves an extensive number of treatment options, and is a complaint that carries with it a high risk of complications and morbidity.  The differential diagnosis includes sepsis, ischemic foot,    Additional history obtained:  Additional history obtained from electronic medical records External records from outside source obtained and reviewed including discharge from previous hospital admission   Co morbidities that complicate the patient evaluation  Dialysis  Social Determinants of Health:  N/A    Lab Tests:  I Ordered, and personally interpreted labs.  The pertinent results include: CBC shows macrocytic anemia hemoglobin 11.3, CMP shows potassium greater than 7.5, BUN of 7 6, creatinine 8.8 prothrombin time 16.9 INR 1.4 Heparin less than 10, lactic 2.3-second lactic 2.7 APTT 35   Imaging Studies ordered:  I ordered imaging studies including chest x-ray, I independently visualized and interpreted imaging which showed cardiomegaly with central  pulmonary vascular congestion. I agree with the radiologist interpretation   Cardiac Monitoring:  The patient was maintained on a cardiac monitor.  I personally viewed and interpreted the cardiac monitored which showed an underlying rhythm of: Sinus rhythm with prolonged QT's abnormal T waves.   Medicines ordered and prescription drug management:  I ordered medication including Dilaudid for pain I have reviewed the patients home medicines and have made adjustments as needed   Reevaluation:/Critical interventions  After the interventions noted above, I reevaluated the patient and found that they have :improved  Noted the patient has a severe electrolyte derailments potassium greater than 7.5 with appears to be EKG changes, will provide him with Lokelma, insulin, albuterol, as well as calcium.  We will consult with neurology for further recommendations.  I am concerned for left foot ischemia as is unable to palpate or Doppler his dorsalis pedal foot, was able to obtain a week pulse on the posterior tibial artery as well as popliteal artery.  Will consult with vascular surgery for further recommendations.  Since patient had bacteremia will obtain blood cultures, restart him on vancomycin as he has missed his previous dosages.  Patient was updated on recommendation from nephrology as well as vascular he is going this plan will consult hospitalist for admission.  Consultations Obtained:  I requested consultation with the [facility] of nephrology,  and discussed lab and imaging findings as well as pertinent plan - they recommend: In agreement with current  treatment plan, patient will undergo dialysis treatment today. Spoke with Dr. Quentin Cornwall of vascular surgery he recommends starting patient on a heparin drip, and obtaining CTA of left leg with runoff and transfer down to [facility].  Patient can be admitted to the hospitalist team, to manage comorbidities Spoke with Dr. Roger Shelter who will  admit the patient, once patient obtain CTA as well as dialysis to be transferred down to [facility].    Test Considered:  X-ray foot but will defer of his lower suspicion for osteomyelitis or fracture at this time as he had recent imaging that was negative for these findings.    Rule out Will suspicion for sepsis and/or SARS does not meet criteria, afebrile, nontachycardic, no leukocytosis present.  He does have an elevated lactic which I suspect is secondary due to probable left foot ischemia.  He was provided 500 mL of fluids, will continue to monitor I do not want to overload the patient.  I have low suspicion for osteomyelitis or worsening infection of the left foot as presentation is inconsistent with presentation, present is more consistent with likely ischemic foot as he has no pedal dorsalis pulse the foot is cold with ischemic transient the first MTP joint.  I have low suspicion for CVA or internal head bleed as patient has recent head trauma, there is no for deficit present my exam.  He does endorse some weakness by suspect this likely secondary due to the severe metabolic derangement from missed dialysis treatment.    Dispostion and problem list  After consideration of the diagnostic results and the patients response to treatment, I feel that the patent would benefit from   Metabolic derangement-likely secondary due to missed dialysis treatments, patient was given temporizing measures here in the emergency department he is stable this time will undergo dialysis for transfer. Left foot ischemia-patient is obtaining CTA of foot, started on heparin, will transfer down to [facility] for vascular consultation. Bacteremia-recently diagnosed with MRSA bacteremia, restarting back on vancomycin.            Final Clinical Impression(s) / ED Diagnoses Final diagnoses:  Hyperkalemia  Ischemic foot    Rx / DC Orders ED Discharge Orders     None         Marcello Fennel, PA-C 02/12/21 1328    Wyvonnia Dusky, MD 02/12/21 1521

## 2021-02-13 DIAGNOSIS — I739 Peripheral vascular disease, unspecified: Secondary | ICD-10-CM

## 2021-02-13 DIAGNOSIS — R21 Rash and other nonspecific skin eruption: Secondary | ICD-10-CM

## 2021-02-13 DIAGNOSIS — B9562 Methicillin resistant Staphylococcus aureus infection as the cause of diseases classified elsewhere: Secondary | ICD-10-CM

## 2021-02-13 DIAGNOSIS — R7881 Bacteremia: Secondary | ICD-10-CM

## 2021-02-13 DIAGNOSIS — N186 End stage renal disease: Secondary | ICD-10-CM

## 2021-02-13 DIAGNOSIS — I482 Chronic atrial fibrillation, unspecified: Secondary | ICD-10-CM

## 2021-02-13 DIAGNOSIS — E875 Hyperkalemia: Secondary | ICD-10-CM | POA: Diagnosis not present

## 2021-02-13 DIAGNOSIS — Z992 Dependence on renal dialysis: Secondary | ICD-10-CM

## 2021-02-13 LAB — BASIC METABOLIC PANEL
Anion gap: 15 (ref 5–15)
Anion gap: 15 (ref 5–15)
BUN: 36 mg/dL — ABNORMAL HIGH (ref 6–20)
BUN: 39 mg/dL — ABNORMAL HIGH (ref 6–20)
CO2: 24 mmol/L (ref 22–32)
CO2: 24 mmol/L (ref 22–32)
Calcium: 8.5 mg/dL — ABNORMAL LOW (ref 8.9–10.3)
Calcium: 9.2 mg/dL (ref 8.9–10.3)
Chloride: 97 mmol/L — ABNORMAL LOW (ref 98–111)
Chloride: 97 mmol/L — ABNORMAL LOW (ref 98–111)
Creatinine, Ser: 5.77 mg/dL — ABNORMAL HIGH (ref 0.61–1.24)
Creatinine, Ser: 6.23 mg/dL — ABNORMAL HIGH (ref 0.61–1.24)
GFR, Estimated: 11 mL/min — ABNORMAL LOW (ref >60)
GFR, Estimated: 10 mL/min — ABNORMAL LOW (ref >60)
Glucose, Bld: 101 mg/dL — ABNORMAL HIGH (ref 70–99)
Glucose, Bld: 84 mg/dL (ref 70–99)
Potassium: 5.3 mmol/L — ABNORMAL HIGH (ref 3.5–5.1)
Potassium: 5.7 mmol/L — ABNORMAL HIGH (ref 3.5–5.1)
Sodium: 136 mmol/L (ref 135–145)
Sodium: 136 mmol/L (ref 135–145)

## 2021-02-13 LAB — APTT
aPTT: 69 seconds — ABNORMAL HIGH (ref 24–36)
aPTT: 157 seconds — ABNORMAL HIGH (ref 24–36)
aPTT: 91 seconds — ABNORMAL HIGH (ref 24–36)

## 2021-02-13 LAB — CBC
HCT: 29.2 % — ABNORMAL LOW (ref 39.0–52.0)
Hemoglobin: 9.7 g/dL — ABNORMAL LOW (ref 13.0–17.0)
MCH: 34.4 pg — ABNORMAL HIGH (ref 26.0–34.0)
MCHC: 33.2 g/dL (ref 30.0–36.0)
MCV: 103.5 fL — ABNORMAL HIGH (ref 80.0–100.0)
Platelets: 198 10*3/uL (ref 150–400)
RBC: 2.82 MIL/uL — ABNORMAL LOW (ref 4.22–5.81)
RDW: 17.8 % — ABNORMAL HIGH (ref 11.5–15.5)
WBC: 9.6 10*3/uL (ref 4.0–10.5)
nRBC: 0.2 % (ref 0.0–0.2)

## 2021-02-13 LAB — HEPARIN LEVEL (UNFRACTIONATED)
Heparin Unfractionated: 0.17 IU/mL — ABNORMAL LOW (ref 0.30–0.70)
Heparin Unfractionated: 0.41 IU/mL (ref 0.30–0.70)
Heparin Unfractionated: 0.21 IU/mL — ABNORMAL LOW (ref 0.30–0.70)

## 2021-02-13 LAB — MAGNESIUM: Magnesium: 2.1 mg/dL (ref 1.7–2.4)

## 2021-02-13 LAB — PROTIME-INR
INR: 1.4 — ABNORMAL HIGH (ref 0.8–1.2)
Prothrombin Time: 17.5 seconds — ABNORMAL HIGH (ref 11.4–15.2)

## 2021-02-13 LAB — PHOSPHORUS: Phosphorus: 5.8 mg/dL — ABNORMAL HIGH (ref 2.5–4.6)

## 2021-02-13 MED ORDER — DARBEPOETIN ALFA 40 MCG/0.4ML IJ SOSY
40.0000 ug | PREFILLED_SYRINGE | INTRAMUSCULAR | Status: DC
  Administered 2021-02-15: 40 ug via INTRAVENOUS
  Filled 2021-02-13 (×2): qty 0.4

## 2021-02-13 MED ORDER — HEPARIN (PORCINE) 25000 UT/250ML-% IV SOLN
1700.0000 [IU]/h | INTRAVENOUS | Status: DC
  Administered 2021-02-13: 1300 [IU]/h via INTRAVENOUS
  Administered 2021-02-14 (×2): 1500 [IU]/h via INTRAVENOUS
  Administered 2021-02-15: 1700 [IU]/h via INTRAVENOUS
  Filled 2021-02-13 (×4): qty 250

## 2021-02-13 MED ORDER — CALAMINE EX LOTN
1.0000 "application " | TOPICAL_LOTION | CUTANEOUS | Status: DC | PRN
  Filled 2021-02-13: qty 177

## 2021-02-13 MED ORDER — SODIUM ZIRCONIUM CYCLOSILICATE 10 G PO PACK
10.0000 g | PACK | Freq: Once | ORAL | Status: AC
  Administered 2021-02-13: 10 g via ORAL
  Filled 2021-02-13: qty 1

## 2021-02-13 NOTE — Progress Notes (Addendum)
ANTICOAGULATION CONSULT NOTE - Follow Up Consult  Pharmacy Consult for Heparin Indication:  Ischemic LE  Allergies  Allergen Reactions   Tape Other (See Comments)    Pulls skin off   Chlorhexidine Gluconate [Chlorhexidine] Rash    Patient Measurements: Height: 5\' 9"  (175.3 cm) Weight: 91.2 kg (201 lb 1 oz) IBW/kg (Calculated) : 70.7 Heparin Dosing Weight:    Vital Signs: Temp: 97.8 F (36.6 C) (01/07 0454) Temp Source: Oral (01/06 1950) BP: 109/64 (01/07 0454) Pulse Rate: 98 (01/07 0454)  Labs: Recent Labs    02/12/21 1005 02/12/21 1234 02/12/21 2046 02/13/21 0304 02/13/21 0308  HGB 11.3*  --   --  9.7*  --   HCT 35.2*  --   --  29.2*  --   PLT 233  --   --  198  --   APTT  --  35 89* 69*  --   LABPROT 16.9*  --   --   --   --   INR 1.4*  --   --   --   --   HEPARINUNFRC <0.10*  --   --   --  0.17*  CREATININE 8.89*  --   --   --   --      Estimated Creatinine Clearance: 11.2 mL/min (A) (by C-G formula based on SCr of 8.89 mg/dL (H)).   Assessment: Eliquis PTA, last dose 02/10/21 at 1700 for LLE ischemia. Heparin Level subtherapeutic with heparin running at 1,500 units/hour. APTT remains in range with level 69 1/7 AM. As heparin levels and APTT are still not correlating, recheck in 8 hours and continue to titrate based on APTT.   Slight downtrend in Hgb from 11.3>9.7 (1/6>1/7). PLT WNL. No overt s/sx of bleeding noted.   Goal of Therapy:  aPTT 66-102 seconds Monitor platelets by anticoagulation protocol: Yes   Plan:  Con't heparin infusion at 1500 units/hr Re-check 8h HL, APTT  Daily APTT, HL, and CBC  ADDENDUM: Upon recheck, heparin level therapeutic at 0.41 with heparin running at 1500 units/h. APTT supratherapeutic at 157. As APTT and heparin level are still not correlating, continue to titrate based on APTT. As APTT is highly elevated, will hold heparin gtt for 1h and decrease infusion rate. Contacted phlebotomy and they stated one arm was restricted so  lab was drawn from the hand on the side heparin was running.   New Plan: Hold heparin for 1 hour  Restart Heparin at 1,300 units/h  Re-check 8h HL/APTT   Adria Dill, PharmD PGY-1 Acute Care Resident  02/13/2021 7:39 AM

## 2021-02-13 NOTE — Evaluation (Signed)
Physical Therapy Evaluation Patient Details Name: Brendan Campbell MRN: [id number] DOB: [birth year] Today's Date: 02/13/2021  History of Present Illness  51 y.o. male Presented 02/12/21 with with missed hemodialysis on Monday and Wednesday which he normally skips.  Also worsening left leg pain, discoloration. +gangrenous changes;    PMH significant of end-stage renal disease (HD MWF), CHF with ejection fraction 20-25%, recent MRSA bacteremia ( on IV vancomycin with HD), right leg AKA, PVD, DM type II, HTN, HLD, anxiety, depression, medically noncompliant  Clinical Impression   Pt admitted secondary to problem above with deficits below. Approximately one week PTA, patient experienced a change in his functional status due to draining wound to rt residual limb and no longer able to don prosthesis. Began using his wheelchair for primary locomotion at home. Wheelchair does not fit into bathroom, therefore pt developed a system of using a bucket at bedside for bowel movements. Reports modified independent bed to wheelchair, however was using his LLE for weight-bearing. Will need clarification from surgeon if pt is going to continue to weight-bear on Left foot.  Pt currently requires no assist for scooting transfer along EOB; deferred assessment of wheelchair transfer due to unknown weight-bearing status for LLE and severity of wound.  Anticipate patient will benefit from PT to address problems listed below.Will continue to follow acutely to maximize functional mobility independence and safety.          Recommendations for follow up therapy are one component of a multi-disciplinary discharge planning process, led by the attending physician.  Recommendations may be updated based on patient status, additional functional criteria and insurance authorization.  Follow Up Recommendations Home health PT (may change depending on surgery recommendations and potential change in LE weight bearing status)    Assistance  Recommended at Discharge Intermittent Supervision/Assistance  Patient can return home with the following  A little help with walking and/or transfers;Assistance with cooking/housework;Assist for transportation;Help with stairs or ramp for entrance    Equipment Recommendations None recommended by PT  Recommendations for Other Services  OT consult    Functional Status Assessment Patient has had a recent decline in their functional status and demonstrates the ability to make significant improvements in function in a reasonable and predictable amount of time.     Precautions / Restrictions Precautions Precautions: Fall      Mobility  Bed Mobility Overal bed mobility: Modified Independent             General bed mobility comments: HOB 0; up to EOB and returned to supine without assist    Transfers Overall transfer level: Modified independent Equipment used: None               General transfer comment: lateral and then posterior scoot to get up toward [facility] prior to return to supine; avoided transferring with weight on Left foot due to severity of wound; next session could try posterior transfer    Ambulation/Gait               General Gait Details: unable to assess: 1) does not have prosthesis at hospital and 2) has wound on distal residual limb and should not wear it  Stairs            Wheelchair Mobility    Modified Rankin (Stroke Patients Only)       Balance Overall balance assessment: Independent (sitting at EOB and during scooting transfers)  Pertinent Vitals/Pain Pain Assessment: No/denies pain Breathing: normal Negative Vocalization: none Facial Expression: smiling or inexpressive Body Language: relaxed Consolability: no need to console PAINAD Score: 0    Home Living Family/patient expects to be discharged to:: Private residence Living Arrangements: Other (Comment) (roommate  and her son) Available Help at Discharge: Friend(s) (roommate; her son is there 24/7 and assists) Type of Home: Mobile home Home Access: Ramped entrance       Home Layout: One level Home Equipment: Cane - single point;Wheelchair - manual;BSC/3in1 (3n1 for shower seat; bucket for [facility]) Additional Comments: uses BSC as shower chair    Prior Function Prior Level of Function : Needs assist       Physical Assist : Mobility (physical)     Mobility Comments: hasn't been able to use prosthesis x 1 week due to sore on residual limb; has been transferring in/out of wheelchair independently; cannot fit w/c into bathroom, therefore has been using a bucket at bedside for bowel movements (reports he hangs his buttocks over side of bed, over the lined bucket and roommate's son empties it for him)       Hand Dominance   Dominant Hand: Right    Extremity/Trunk Assessment   Upper Extremity Assessment Upper Extremity Assessment: Defer to OT evaluation    Lower Extremity Assessment Lower Extremity Assessment: RLE deficits/detail;LLE deficits/detail RLE Deficits / Details: large area of necrosis over distal, medial residual limb; pt reports it was bleeding and draining a few days ago, but has stopped since he stopped using prosthesis; ROM WFL RLE Sensation: history of peripheral neuropathy LLE Deficits / Details: edematous foot and lower leg with ++erythema; black eschar noted on dorsum of foot at base of 1-2nd metatarsals. LLE Sensation: history of peripheral neuropathy    Cervical / Trunk Assessment Cervical / Trunk Assessment: Other exceptions Cervical / Trunk Exceptions: overweight; covered in scratches and lesions  Communication   Communication: No difficulties  Cognition Arousal/Alertness: Awake/alert Behavior During Therapy: Restless (itching, scratching) Overall Cognitive Status: No family/caregiver present to determine baseline cognitive functioning                                  General Comments: poor safety awareness (related to how he describes he uses bucket for BSC at home); poor awareness of severity of wounds on bil LEs        General Comments General comments (skin integrity, edema, etc.): Discussed potential discharge plan however awaiting surgical input for LLE and may need to consider SNF for therapies. Pt adamantly refusing to consider SNF.    Exercises     Assessment/Plan    PT Assessment Patient needs continued PT services  PT Problem List Decreased mobility;Decreased knowledge of use of DME;Decreased safety awareness;Decreased knowledge of precautions;Impaired sensation;Decreased skin integrity;Pain       PT Treatment Interventions DME instruction;Functional mobility training;Therapeutic activities;Therapeutic exercise;Balance training;Cognitive remediation;Patient/family education;Wheelchair mobility training    PT Goals (Current goals can be found in the Care Plan section)  Acute Rehab PT Goals Patient Stated Goal: to go home PT Goal Formulation: With patient Time For Goal Achievement: 02/27/21 Potential to Achieve Goals: Good    Frequency Min 3X/week     Co-evaluation               AM-PAC PT "6 Clicks" Mobility  Outcome Measure Help needed turning from your back to your side while in a flat bed without using bedrails?: None  Help needed moving from lying on your back to sitting on the side of a flat bed without using bedrails?: None Help needed moving to and from a bed to a chair (including a wheelchair)?: A Little Help needed standing up from a chair using your arms (e.g., wheelchair or bedside chair)?: Total Help needed to walk in hospital room?: Total Help needed climbing 3-5 steps with a railing? : Total 6 Click Score: 14    End of Session   Activity Tolerance: Patient tolerated treatment well Patient left: in bed;with call bell/phone within reach   PT Visit Diagnosis: Difficulty in walking, not  elsewhere classified (R26.2)    Time: 7373-6681 PT Time Calculation (min) (ACUTE ONLY): 22 min   Charges:   PT Evaluation $PT Eval Moderate Complexity: [place], PT Acute Rehabilitation Services  Pager [phone] Office [phone]   Rexanne Mano 02/13/2021, 12:21 PM

## 2021-02-13 NOTE — Assessment & Plan Note (Signed)
-   Continue aspirin, statin, Coreg

## 2021-02-13 NOTE — Assessment & Plan Note (Signed)
-  Continue Lipitor °

## 2021-02-13 NOTE — Evaluation (Signed)
Occupational Therapy Evaluation Patient Details Name: Brendan Campbell MRN: [id number] DOB: Jul 23, 1970 Today's Date: 02/13/2021   History of Present Illness 51 y.o. male Presented 02/12/21 with with missed hemodialysis on Monday and Wednesday which he normally skips.  Also worsening left leg pain, discoloration. +gangrenous changes;    PMH significant of end-stage renal disease (HD MWF), CHF with ejection fraction 20-25%, recent MRSA bacteremia ( on IV vancomycin with HD), right leg AKA, PVD, DM type II, HTN, HLD, anxiety, depression, medically noncompliant   Clinical Impression   Pt admitted for concerns listed above. PTA pt reports that he was independent completing all transfers and ADL's.  Additionally he has recently been WC  level due to wound on his residual limb. At this time, pt presents with increased lethargy falling asleep throughout session and requiring min A for anterior/posterior transfers and LB ADL's. Recommending HHOT at this time, will touch base with pt after surgical plan is made for further discussion of discharge needs. OT will follow acutely.      Recommendations for follow up therapy are one component of a multi-disciplinary discharge planning process, led by the attending physician.  Recommendations may be updated based on patient status, additional functional criteria and insurance authorization.   Follow Up Recommendations  Home health OT    Assistance Recommended at Discharge Intermittent Supervision/Assistance  Patient can return home with the following A little help with walking and/or transfers;A lot of help with bathing/dressing/bathroom;Assistance with cooking/housework    Functional Status Assessment  Patient has had a recent decline in their functional status and demonstrates the ability to make significant improvements in function in a reasonable and predictable amount of time.  Equipment Recommendations  None recommended by OT    Recommendations for Other  Services       Precautions / Restrictions Precautions Precautions: Fall Restrictions Weight Bearing Restrictions: No      Mobility Bed Mobility Overal bed mobility: Modified Independent             General bed mobility comments: used bed railings\    Transfers Overall transfer level: Needs assistance Equipment used: None Transfers: Bed to chair/wheelchair/BSC         Anterior-Posterior transfers: Min assist   General transfer comment: Min A with bed pad, most likely wont need as much assist when not so lethargic      Balance Overall balance assessment: Independent (sitting at EOB and during scooting transfers)                                         ADL either performed or assessed with clinical judgement   ADL Overall ADL's : Needs assistance/impaired Eating/Feeding: Set up;Sitting   Grooming: Set up;Sitting   Upper Body Bathing: Set up;Sitting   Lower Body Bathing: Minimal assistance;Sitting/lateral leans   Upper Body Dressing : Set up;Sitting   Lower Body Dressing: Minimal assistance;Sitting/lateral leans   Toilet Transfer: Minimal assistance;Anterior/posterior   Toileting- Clothing Manipulation and Hygiene: Minimal assistance;Sitting/lateral lean         General ADL Comments: Limited to bed level/seated activities due to fatigue and not weight bearing on LLE     Vision Baseline Vision/History: 1 Wears glasses Ability to See in Adequate Light: 0 Adequate Patient Visual Report: No change from baseline Vision Assessment?: No apparent visual deficits     Perception     Praxis  Pertinent Vitals/Pain Pain Assessment: No/denies pain Breathing: normal Negative Vocalization: none Facial Expression: smiling or inexpressive Body Language: relaxed Consolability: no need to console PAINAD Score: 0     Hand Dominance Right   Extremity/Trunk Assessment Upper Extremity Assessment Upper Extremity Assessment: Overall WFL  for tasks assessed   Lower Extremity Assessment Lower Extremity Assessment: Defer to PT evaluation   Cervical / Trunk Assessment Cervical / Trunk Assessment: Other exceptions Cervical / Trunk Exceptions: overweight; covered in scratches and lesions   Communication Communication Communication: No difficulties   Cognition Arousal/Alertness: Awake/alert;Lethargic Behavior During Therapy: Flat affect Overall Cognitive Status: No family/caregiver present to determine baseline cognitive functioning                                 General Comments: poor safety awareness, poor awareness of severity of wounds on bil LEs, Provided different answers to OT than PT     General Comments  PT falling asleep during session sitting up    Exercises     Shoulder Instructions      Home Living Family/patient expects to be discharged to:: Private residence Living Arrangements: Other (Comment) (roommate and her son) Available Help at Discharge: Friend(s) (roommate; her son is there 24/7 and assists) Type of Home: Mobile home Home Access: Ramped entrance     Home Layout: One level     Bathroom Shower/Tub: Teacher, early years/pre: Handicapped height Bathroom Accessibility: Yes   Home Equipment: Wheelchair - IT trainer (2 wheels)   Additional Comments: uses BSC as shower chair      Prior Functioning/Environment Prior Level of Function : Needs assist             Mobility Comments: hasn't been able to use prosthesis x 1 week due to sore on residual limb; has been transferring in/out of wheelchair independently) ADLs Comments: Uses BSC for toileting and sponge bathes.        OT Problem List: Decreased strength;Decreased activity tolerance;Impaired balance (sitting and/or standing);Decreased safety awareness;Impaired sensation;Pain      OT Treatment/Interventions: Self-care/ADL training;Therapeutic exercise;Energy conservation;DME and/or  AE instruction;Therapeutic activities;Patient/family education;Balance training    OT Goals(Current goals can be found in the care plan section) Acute Rehab OT Goals Patient Stated Goal: None stated OT Goal Formulation: With patient Time For Goal Achievement: 02/27/21 Potential to Achieve Goals: Good ADL Goals Pt Will Perform Lower Body Bathing: with supervision;sitting/lateral leans Pt Will Perform Lower Body Dressing: sitting/lateral leans;with adaptive equipment;with supervision Pt Will Transfer to Toilet: with supervision;anterior/posterior transfer Pt Will Perform Toileting - Clothing Manipulation and hygiene: with supervision;sitting/lateral leans  OT Frequency: Min 2X/week    Co-evaluation              AM-PAC OT "6 Clicks" Daily Activity     Outcome Measure Help from another person eating meals?: A Little Help from another person taking care of personal grooming?: A Little Help from another person toileting, which includes using toliet, bedpan, or urinal?: A Lot Help from another person bathing (including washing, rinsing, drying)?: A Lot Help from another person to put on and taking off regular upper body clothing?: A Little Help from another person to put on and taking off regular lower body clothing?: A Lot 6 Click Score: 15   End of Session Nurse Communication: Mobility status  Activity Tolerance: Patient limited by fatigue Patient left: in bed;with call bell/phone within reach;with bed alarm set  OT Visit  Diagnosis: Other abnormalities of gait and mobility (R26.89);Muscle weakness (generalized) (M62.81)                Time: 1610-9604 OT Time Calculation (min): 14 min Charges:  OT General Charges $OT Visit: 1 Visit OT Evaluation $OT Eval Moderate Complexity: 1 Mod  Anavictoria Wilk H., OTR/L Acute Rehabilitation  Baani Bober Elane Yolanda Bonine 02/13/2021, 6:31 PM

## 2021-02-13 NOTE — Progress Notes (Signed)
ANTICOAGULATION CONSULT NOTE - Follow Up Consult  Pharmacy Consult for Heparin Indication:  Ischemic LE  Allergies  Allergen Reactions   Tape Other (See Comments)    Pulls skin off   Chlorhexidine Gluconate [Chlorhexidine] Rash    Patient Measurements: Height: 5\' 9"  (175.3 cm) Weight: 91.2 kg (201 lb 1 oz) IBW/kg (Calculated) : 70.7 Heparin Dosing Weight:    Vital Signs: BP: 116/85 (01/07 2052) Pulse Rate: 87 (01/07 2052)  Labs: Recent Labs    02/12/21 1005 02/12/21 1234 02/13/21 0304 02/13/21 0308 02/13/21 1058 02/13/21 1424 02/13/21 2200  HGB 11.3*  --  9.7*  --   --   --   --   HCT 35.2*  --  29.2*  --   --   --   --   PLT 233  --  198  --   --   --   --   APTT  --    < > 69*  --  157*  --  91*  LABPROT 16.9*  --   --   --   --   --   --   INR 1.4*  --   --   --   --   --   --   HEPARINUNFRC <0.10*  --   --  0.17* 0.41  --  0.21*  CREATININE 8.89*  --   --   --   --  6.23*  --    < > = values in this interval not displayed.     Estimated Creatinine Clearance: 16 mL/min (A) (by C-G formula based on SCr of 6.23 mg/dL (H)).   Assessment: Eliquis PTA, last dose 02/10/21 at 1700 for LLE ischemia. Switched to dosing heparin per pharmacy.   Currently on IV heparin at 1300 units/hr. Monitoring both aPTT and HL and they don't seem to correlate. Given than heparin level is now subtherapeutic, Eliquis is no longer impacting the level. Will continue to make adjustment based off heparin levels moving forward and stop checking aPTTs.   RN reports no s/s of bleeding   Goal of Therapy:  Heparin level 0.3-0.7 units/ml Monitor platelets by anticoagulation protocol: Yes   Plan:  Increase heparin drip to 1500 units/hr  F/u HL level in AM  Daily HL, and CBC   Albertina Parr, PharmD., BCPS, BCCCP Clinical Pharmacist Please refer to [facility] for unit-specific pharmacist

## 2021-02-13 NOTE — Assessment & Plan Note (Addendum)
-   patient reports 1/2 was last session of HD; missed Wed and Fri and required urgent HD at [facility] instead later on Friday - nephrology following, plan is to resume normal HD schedule on 02/15/2021

## 2021-02-13 NOTE — Progress Notes (Addendum)
[facility] Progress Note   Subjective:   Patient seen and examined at bedside in room.  Sitting up eating lunch.  Weakness improved after dialysis.  Reports he is waiting to see surgeon to find out about foot.  Denies CP, SOB, abdominal pain, and n/v/d.  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, 1+ LLE edema, forefoot with erythema and eschar over L great toe amputation site Skin - pale, jaundice, diffuse excoriations in various stages of healing  Dialysis Access: LU AVF +b/t   Filed Weights   02/12/21 0845 02/12/21 1520 02/12/21 1950  Weight: 93.4 kg 93.4 kg 91.2 kg    Intake/Output Summary (Last 24 hours) at 02/13/2021 1248 Last data filed at 02/13/2021 0800 Gross per 24 hour  Intake 540 ml  Output 1806 ml  Net -1266 ml    Additional Objective Labs: Basic Metabolic Panel: Recent Labs  Lab 02/12/21 1005  NA 135  K >7.5*  CL 95*  CO2 22  GLUCOSE 92  BUN 76*  CREATININE 8.89*  CALCIUM 8.9   Liver Function Tests: Recent Labs  Lab 02/12/21 1005  AST 17  ALT 12  ALKPHOS 83  BILITOT 1.0  PROT 7.1  ALBUMIN 3.3*   CBC: Recent Labs  Lab 02/12/21 1005 02/13/21 0304  WBC 8.3 9.6  NEUTROABS 5.7  --   HGB 11.3* 9.7*  HCT 35.2* 29.2*  MCV 108.3* 103.5*  PLT 233 198   Blood Culture    Component Value Date/Time   SDES BLOOD RIGHT FOREARM 02/12/2021 1055   SPECREQUEST  02/12/2021 1055    BOTTLES DRAWN AEROBIC AND ANAEROBIC Blood Culture adequate volume   CULT  02/12/2021 1055    NO GROWTH < 24 HOURS Performed at  [facility], [address], [place], [place] [address]    REPTSTATUS PENDING 02/12/2021 1055    CBG: Recent Labs  Lab 02/06/21 1647 02/12/21 2214  GLUCAP 80 101*   Studies/Results: CT ANGIO AO+BIFEM W & OR WO CONTRAST  Result Date: 02/12/2021 CLINICAL DATA:  Left foot pain for the past 3 weeks. Concern for ischemia affecting the left foot. EXAM: CT ANGIOGRAPHY OF ABDOMINAL AORTA WITH ILIOFEMORAL RUNOFF TECHNIQUE: Multidetector CT imaging of the abdomen, pelvis and lower extremities was performed using the standard protocol during bolus administration of intravenous contrast. Multiplanar CT image reconstructions and MIPs were obtained to evaluate the vascular anatomy. CONTRAST:  155mL OMNIPAQUE IOHEXOL 350 MG/ML SOLN COMPARISON:  None. FINDINGS: VASCULAR Aorta: Moderate amount of calcified atherosclerotic plaque within normal caliber abdominal aorta, not resulting in a hemodynamically significant stenosis. No evidence of abdominal aortic dissection or perivascular stranding. Celiac: Widely patent without hemodynamically significant narrowing. SMA: Widely patent without hemodynamically significant narrowing. A replaced right hepatic artery is incidentally noted to arise from the proximal SMA. The distal tributaries of the SMA appear widely patent without discrete lumen filling defect to suggest distal embolism. Renals: Duplicated left renal arteries, nearly co-dominant. All renal  arteries are heavily diseased with tandem areas of suspected hemodynamically significant narrowings, an expected finding given end-stage renal disease and associated bilateral renal atrophy. 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 _________________________________________________________ RIGHT Lower Extremity Inflow: There is a minimal amount of calcified atherosclerotic plaque involving the normal caliber right common iliac artery, not resulting in a hemodynamically  significant stenosis. The right internal and external iliac arteries are mildly diseased though patent and of normal caliber. Outflow: There is mixed calcified and noncalcified atherosclerotic plaque involving the right common femoral artery, not resulting in hemodynamically significant stenosis. The right deep femoral artery is disease though patent of normal caliber. There is circumferential predominantly calcified atherosclerotic plaque throughout the right superficial femoral artery, which ultimately occludes at the level of the adductor canal. Both the right above and below-knee popliteal artery is occluded. Runoff: There is no significant tibial runoff to the residual below-knee amputation, though evaluation degraded secondary to extensive mural calcifications. _________________________________________________________ LEFT Lower Extremity Inflow: There is a minimal amount of calcified atherosclerotic plaque involving the normal caliber left common iliac artery, not resulting in a hemodynamically significant stenosis. The left internal and external iliac arteries are mildly diseased though patent and of normal caliber. Outflow: There is a minimal amount of calcified atherosclerotic plaque involving the left common femoral artery, not resulting in hemodynamically significant stenosis. The left deep femoral artery is patent and of normal caliber. There is concentric predominantly calcified atherosclerotic plaque throughout the left superficial femoral artery not definitely resulting in hemodynamically significant stenosis, though note, evaluation degraded secondary to extensive mural calcifications. There is a moderate to large amount of eccentric mixed calcified and noncalcified atherosclerotic plaque involving the left above knee popliteal artery resulting in tandem areas of at least 50% luminal narrowing (image 264, 287 and 293, series 5). The left below-knee popliteal artery appears patent without  hemodynamically significant narrowing. Runoff: Apparent three-vessel runoff to the left lower leg, though note, evaluation degraded secondary to significant mural calcifications. A definitive left-sided dorsalis pedis artery is not identified. No discrete lumen filling defects to suggest distal embolism. Veins: Normal appearance of the IVC and pelvic venous system on this arterial phase examination. Review of the MIP images confirms the above findings. _________________________________________________________ _________________________________________________________ NON-VASCULAR Evaluation of abdominal organs is limited to the arterial phase of enhancement. Lower chest: Limited visualization of the lower thorax is negative for focal airspace opacity though does demonstrate mild interstitial thickening. No discrete focal airspace opacities. No pleural effusion. Cardiomegaly. Coronary artery calcifications. No pericardial effusion. Hepatobiliary: Normal hepatic contour. There is diffuse decreased attenuation of the hepatic parenchyma suggestive of hepatic steatosis. No discrete hyperenhancing hepatic lesions. Post cholecystectomy. No intra or extrahepatic biliary duct dilatation. Trace amount of perihepatic ascites. Pancreas: Pancreas is largely fatty replaced. Spleen: Normal appearance of the spleen. Adrenals/Urinary Tract: The bilateral kidneys are markedly atrophic compatible with known history of end-stage renal disease. No urinary obstruction. There is mild thickening the left adrenal gland without discrete nodule. Normal appearance of the right adrenal gland. Diffuse thickening the urinary bladder wall, likely accentuated due to underdistention. Stomach/Bowel: Large colonic stool burden, particularly within the rectal vault. No evidence of enteric obstruction. Normal appearance of the terminal ileum and the retrocecal appendix. No hiatal hernia. No discrete areas of bowel wall thickening. No pneumoperitoneum,  pneumatosis or portal venous gas. Lymphatic: No bulky retroperitoneal, mesenteric, pelvic or inguinal lymphadenopathy. Reproductive: Normal appearance the prostate gland. There is a small amount of fluid  within the pelvic cul-de-sac. Other: Apparent 1.3 cm soft tissue ulcer involving the medial mid aspect of the left foot (image 459, series 5). No discrete areas of osteolysis to suggest osteomyelitis. Diffuse body wall anasarca. Musculoskeletal: No definite acute or aggressive osseous abnormalities. There is diffuse increased sclerosis of the imaged osseous structures suggestive of renal osteodystrophy. Prominent Schmorl's node are seen involving several vertebral bodies, most conspicuously involving L2. Patient is post right-sided below-knee amputation as well as amputation of the first and second digits. IMPRESSION: VASCULAR 1. Large amount of atherosclerotic plaque within normal caliber abdominal aorta, not resulting in a hemodynamically significant stenosis. Aortic aneurysm NOS (ICD10-I71.9). 2. Post right below-knee amputation with age-indeterminate though presumably chronic occlusion of the distal aspect of the right superficial femoral artery and the popliteal artery with no significant runoff to the residual stump, though evaluation degraded secondary to extensive mural calcifications. 3. Suspected tandem areas of hemodynamically significant narrowing involving the left above knee popliteal artery. 4. Apparent three-vessel runoff to the left lower leg, though note, evaluation degraded secondary to extensive mural calcifications. A discrete left-sided dorsalis pedis artery is not identified. No discrete intraluminal filling defects to suggest distal embolism. NON-VASCULAR 1. Apparent 1.3 cm soft tissue ulcer about the medial mid aspect of the left foot without discrete area of osteolysis to suggest osteomyelitis. Further evaluation with MRI could be performed as indicated. 2. Atrophic kidneys compatible with  known history of end-stage renal disease. 3. Cardiomegaly with diffuse body wall anasarca and stigmata of pulmonary edema within the imaged lung bases. No pleural effusions. Electronically Signed   By: Sandi Mariscal M.D.   On: 02/12/2021 15:16   DG Chest Port 1 View  Result Date: 02/12/2021 CLINICAL DATA:  Shortness of breath. EXAM: PORTABLE CHEST 1 VIEW COMPARISON:  February 04, 2021. FINDINGS: Stable cardiomegaly. Mild central pulmonary vascular congestion is noted with probable minimal bilateral pulmonary edema. No consolidative process is noted. Bony thorax is unremarkable. IMPRESSION: Mild cardiomegaly with central pulmonary vascular congestion and probable minimal bilateral pulmonary edema. Electronically Signed   By: Marijo Conception M.D.   On: 02/12/2021 10:29   DG Foot Complete Left  Result Date: 02/12/2021 CLINICAL DATA:  Infection at the first metatarsophalangeal joint. 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 Osteotomy changes of the proximal phalanx of the third digit. Soft tissue swelling and vascular calcifications. No cortical erosion or periosteal reaction concerning for osteomyelitis. IMPRESSION: 1. Postsurgical changes as detailed above. 2. No radiographic evidence of osteomyelitis, if there is a clinical concern, MRI examination could be obtained for further evaluation. Electronically Signed   By: Keane Police D.O.   On: 02/12/2021 10:28    Medications:  sodium chloride     sodium chloride     sodium chloride     heparin 1,500 Units/hr (02/13/21 0612)   vancomycin 1,000 mg (02/12/21 1842)    aspirin  81 mg Oral Daily   [START ON 02/15/2021] atorvastatin  10 mg Oral Q M,W,F-1800   calcium acetate  667 mg Oral TID WC   calcium carbonate  800 mg of elemental calcium Oral BID   carvedilol  3.125 mg Oral BID WC   [START ON 02/15/2021] cholecalciferol   1,000 Units Oral Q M,W,F   pantoprazole  20 mg Oral Daily   sodium chloride flush  3 mL Intravenous Q12H   sodium chloride flush  3 mL Intravenous Q12H    Dialysis Orders: [facility]  on MWF  4h 85min  95 kg   1K/2.25 bath  Hep 1000+ 500u/hr  LUE AVG  300/500   - prosthetic weighs 1.6 kg  - Epogen 1000   Units IV/HD     Assessment/Plan:  Hyperkalemia - due to noncompliance with HD.  Improved after urgent HD completed yesterday. On 1K bath as outpatient.   MRSA bacteremia - missed 1 week of IV vancomycin. Resume IV vanc and consult ID.    PAD - appears to have some gangrenous changes of his left great toe amputation site.  Surgery to evaluate.  Likely will need further amputation.   ESRD -  Next HD on 02/15/21 per regular schedule.   Hypertension/volume  - Blood pressure at goal. Lower extremity edema noted with no respiratory distress.  Monitor closely.   Anemia  - Hgb 9.7, on ESA. Will order to give with HD Monday.   Metabolic bone disease -   Calcium in goal. Check phos.  Continue home meds.  Not on VDRA.   Nutrition - renal diet, carb modified with fluid restrictions.   Jen Mow, PA-C [facility] 02/13/2021,12:48 PM  LOS: 1 day    Seen and examined independently.  Agree with note and exam as documented above by physician extender and as noted here.  Discussed with vascular MD. Note per vascular patient with flies removed from wound.  Patient states he goes to HD on mondays and fridays - just doesn't feel like it on [place] adult male in bed in no acute distress HEENT normocephalic atraumatic extraocular movements intact sclera anicteric Neck supple trachea midline Lungs clear to auscultation bilaterally normal work of breathing at rest  Heart S1S2 no rub Abdomen soft nontender nondistended/obese habitus Extremities right BKA without residual limb; left foot is wrapped edema appreciated   Access LUE AVG  Hyperkalemia - secondary to  noncompliance with HD - no labs today - added on renal panel for today - no updated labs   ESRD - MWF schedule for HD - he is noncompliant  - plan for next HD on Monday unless needed urgently for K  MRSA bacteremia  - abx per primary - on vanc   PAD - concern for need for left lower extremity amputation; per vascular  HTN  - optimize volume with HD  Anemia CKD - ESA as above  Metabolic bone disease - with diffuse rash. Likely contributing in some aspect to rash.   Claudia Desanctis, MD 02/13/2021 1:47 PM

## 2021-02-13 NOTE — Consult Note (Addendum)
Hospital Consult   Reason for Consult: Left foot wound Requesting Physician: Dr. Sabino Gasser MRN #:  [id number]  History of Present Illness: This is a 51 y.o. 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  Labs demonstrated a potassium greater than 7.  Brendan Campbell was transferred to [facility] for multispecialty care.  Vascular surgery was called to address the nonhealing left foot.  On exam, Brendan Campbell is resting comfortably in bed.  There were flies surrounding the open, necrotic first toe amputation.  Brendan Campbell was a rather poor historian but stated the left foot wound had looked bad for several weeks.  Brendan Campbell knew that the area of concern was not a scab but rather bone.  Brendan Campbell denied fevers, chills, drainage.  Brendan Campbell had numerous scratch marks over his body, blaming cats, that Brendan Campbell recently got rid of.   Brendan Campbell currently lives in [place] with his son and roommate.  Brendan Campbell has minimal ambulation using a prosthesis on his right below-knee amputation site.   Past Medical History:  Diagnosis Date   Anemia    Anxiety    Blood transfusion without reported diagnosis    CHF (congestive heart failure) ([place])    a. EF 25% by echo in 07/2019   Chronic kidney disease    Depression    Diabetes mellitus without complication ([place])    End stage renal disease ([place])    M/W/F [facility]   Hyperlipidemia    Neuropathy    Peripheral vascular disease (HCC)    PTSD (post-traumatic stress disorder)     Past Surgical History:  Procedure Laterality Date   A/V FISTULAGRAM N/A 10/09/2018   Procedure: A/V FISTULAGRAM;  Surgeon: Serafina Mitchell, MD;  Location: [place] [facility];  Service: Cardiovascular;  Laterality: N/A;   AV FISTULA PLACEMENT Left 09/22/2016   Procedure: CREATION OF LEFT ARM ARTERIOVENOUS (AV) FISTULA;   Surgeon: Waynetta Sandy, MD;  Location: [place];  Service: Vascular;  Laterality: Left;   AV FISTULA PLACEMENT Left 10/31/2017   Procedure: INSERTION OF ARTERIOVENOUS (AV) GORE-TEX 4-40mm STETCH GRAFT LEFT ARM;  Surgeon: Waynetta Sandy, MD;  Location: [place];  Service: Vascular;  Laterality: Left;   [place] Left 08/17/2017   Procedure: SECOND STAGE BASILIC VEIN TRANSPOSITION LEFT ARM;  Surgeon: Waynetta Sandy, MD;  Location: [place];  Service: Vascular;  Laterality: Left;   BELOW KNEE LEG AMPUTATION Right    CARDIOVERSION N/A 02/19/2019   Procedure: CARDIOVERSION;  Surgeon: [name], MD;  Location: [place];  Service: Cardiovascular;  Laterality: N/A;   CATARACT EXTRACTION W/PHACO Right 10/23/2020   Procedure: CATARACT EXTRACTION PHACO AND INTRAOCULAR LENS PLACEMENT ([place]);  Surgeon: Baruch Goldmann, MD;  Location: [facility];  Service: Ophthalmology;  Laterality: Right;  CDE 39.03   CHOLECYSTECTOMY     FOOT SURGERY     IR FLUORO GUIDE CV LINE RIGHT  05/16/2016   IR FLUORO GUIDE CV LINE RIGHT  02/16/2019   IR REMOVAL TUN CV CATH W/O FL  05/16/2016   IR REMOVAL TUN CV CATH W/O FL  02/19/2019   IR THROMBECTOMY AV FISTULA W/THROMBOLYSIS/PTA INC/SHUNT/IMG LEFT Left 11/09/2018   IR THROMBECTOMY AV FISTULA W/THROMBOLYSIS/PTA INC/SHUNT/IMG LEFT Left 06/22/2019   IR US GUIDE VASC ACCESS LEFT  11/09/2018   IR US GUIDE VASC ACCESS LEFT  06/22/2019   IR US GUIDE VASC ACCESS RIGHT  05/16/2016   IR US GUIDE VASC ACCESS RIGHT  02/16/2019   PERIPHERAL VASCULAR BALLOON ANGIOPLASTY  10/09/2018   Procedure: PERIPHERAL VASCULAR BALLOON ANGIOPLASTY;  Surgeon: Serafina Mitchell, MD;  Location: [place] [facility];  Service: Cardiovascular;;   TEE WITHOUT CARDIOVERSION N/A 02/19/2019   Procedure: TRANSESOPHAGEAL ECHOCARDIOGRAM (TEE);  Surgeon: [name], MD;  [facility];  Service: Cardiovascular;  Laterality: N/A;    Allergies  Allergen Reactions   Tape Other  (See Comments)    Pulls skin off   Chlorhexidine Gluconate [Chlorhexidine] Rash    Prior to Admission medications   Medication Sig Start Date End Date Taking? Authorizing Provider  acetaminophen (TYLENOL) 325 MG tablet Take 2 tablets (650 mg total) by mouth every 6 (six) hours as needed for mild pain, moderate pain or fever. 02/06/21  Yes Cherene Altes, MD  albuterol (VENTOLIN HFA) 108 (90 Base) MCG/ACT inhaler Inhale 2 puffs into the lungs every 4 (four) hours as needed for wheezing or shortness of breath. 07/21/20  Yes Manuella Ghazi, Pratik D, DO  apixaban (ELIQUIS) 5 MG TABS tablet Take 1 tablet (5 mg total) by mouth 2 (two) times daily. 08/23/19  Yes Strader, Fransisco Hertz, PA-C  aspirin 81 MG chewable tablet Chew 81 mg by mouth daily.   Yes [provider]  atorvastatin (LIPITOR) 10 MG tablet Take 1 tablet (10 mg total) by mouth every evening. Patient taking differently: Take 10 mg by mouth every evening. Take 1 tablet on Monday, Wednesday and Friday. 08/03/19  Yes Barton Dubois, MD  butalbital-acetaminophen-caffeine (FIORICET) [phone] MG tablet Take 1 tablet by mouth every 6 (six) hours as needed for headache or migraine. 07/21/20  Yes Shah, Pratik D, DO  calcium acetate (PHOSLO) 667 MG capsule Take 1 capsule (667 mg total) by mouth 3 (three) times daily with meals. 02/21/19  Yes Donnamae Jude, MD  calcium carbonate (TUMS - DOSED IN MG ELEMENTAL CALCIUM) 500 MG chewable tablet Chew 4 tablets (800 mg of elemental calcium total) by mouth 2 (two) times daily. 08/03/19  Yes Barton Dubois, MD  Cholecalciferol (VITAMIN D3) 25 MCG (1000 UT) CAPS Take 1 capsule by mouth See admin instructions. Take before Dialysis on Monday Wednesday and Fridays   Yes [provider]  oxyCODONE (OXY IR/ROXICODONE) 5 MG immediate release tablet Take 1 tablet (5 mg total) by mouth every 6 (six) hours as needed for severe pain. 02/06/21  Yes Cherene Altes, MD  pantoprazole (PROTONIX) 20 MG tablet Take 1  tablet (20 mg total) by mouth daily. 11/26/20  Yes Milton Ferguson, MD  vancomycin (VANCOCIN) 1-5 GM/200ML-% SOLN Inject 200 mLs (1,000 mg total) into the vein every Monday, Wednesday, and Friday with hemodialysis. 02/08/21 03/22/21 Yes Cherene Altes, MD  carvedilol (COREG) 3.125 MG tablet Take 1 tablet (3.125 mg total) by mouth 2 (two) times daily with a meal. Patient not taking: Reported on 12/07/2020 04/09/20 11/26/20  Deatra Ameir, MD  patiromer (VELTASSA) 8.4 g packet Take 1 packet (8.4 g total) by mouth 3 (three) times a week. On Tuesday, Thursday and Saturday Patient not taking: Reported on 02/12/2021 06/10/19   Kathie Dike, MD    Social History   Socioeconomic History   Marital status: Single    Spouse name: Not on file   Number of children: Not on file   Years of education: Not on file   Highest education level: Not on file  Occupational History   Not on file  Tobacco Use  Smoking status: Some Days    Types: Cigarettes    Last attempt to quit: 09/03/2006    Years since quitting: 14.4   Smokeless tobacco: Never  Vaping Use   Vaping Use: Never used  Substance and Sexual Activity   Alcohol use: No   Drug use: No   Sexual activity: Not Currently  Other Topics Concern   Not on file  Social History Narrative   Pt lives in [place] with roommate.  Not followed by an outpatient provider currently, but is trying to schedule an appointment with [facility].   Social Determinants of Health   Financial Resource Strain: Not on file  Food Insecurity: Not on file  Transportation Needs: Not on file  Physical Activity: Not on file  Stress: Not on file  Social Connections: Not on file  Intimate Partner Violence: Not on file   Family History  Problem Relation Age of Onset   Cancer Mother        lung   Diabetes Mother    Heart attack Father    Diabetes Father    Diabetes Sister     ROS: Otherwise negative unless mentioned in HPI  Physical  Examination  Vitals:   02/13/21 0454 02/13/21 0934  BP: 109/64 112/90  Pulse: 98 88  Resp: 17 17  Temp: 97.8 F (36.6 C) 98.1 F (36.7 C)  SpO2: (!) 87% 91%   Body mass index is 29.69 kg/m.  General:  WDWN in NAD Gait: Not observed HENT: WNL, normocephalic Pulmonary: normal non-labored breathing, without Rales, rhonchi,  wheezing Cardiac: regular, Abdomen:  soft, NT/ND, no masses Skin: with numerous scratches, excoriations Vascular Exam/Pulses: 2+ femoral arteries bilaterally, nonpalpable pulse in the foot.  Extremities: with ischemic changes, with Gangrene , with open wounds;          Musculoskeletal: no muscle wasting or atrophy  Neurologic: A&O X 3;  No focal weakness or paresthesias are detected; speech is fluent/normal Psychiatric:  The pt has Normal affect. Lymph:  Unremarkable  CBC    Component Value Date/Time   WBC 9.6 02/13/2021 0304   RBC 2.82 (L) 02/13/2021 0304   HGB 9.7 (L) 02/13/2021 0304   HGB 10.4 (L) 06/02/2016 1107   HCT 29.2 (L) 02/13/2021 0304   HCT 32.9 (L) 06/02/2016 1107   PLT 198 02/13/2021 0304   PLT 389 (H) 06/02/2016 1107   MCV 103.5 (H) 02/13/2021 0304   MCV 94 06/02/2016 1107   MCH 34.4 (H) 02/13/2021 0304   MCHC 33.2 02/13/2021 0304   RDW 17.8 (H) 02/13/2021 0304   RDW 16.0 (H) 06/02/2016 1107   LYMPHSABS 1.3 02/12/2021 1005   LYMPHSABS 2.9 06/02/2016 1107   MONOABS 0.6 02/12/2021 1005   EOSABS 0.6 (H) 02/12/2021 1005   EOSABS 0.5 (H) 06/02/2016 1107   BASOSABS 0.1 02/12/2021 1005   BASOSABS 0.1 06/02/2016 1107    BMET    Component Value Date/Time   NA 135 02/12/2021 1005   NA 142 12/16/2015 1051   K >7.5 (HH) 02/12/2021 1005   CL 95 (L) 02/12/2021 1005   CO2 22 02/12/2021 1005   GLUCOSE 92 02/12/2021 1005   BUN 76 (H) 02/12/2021 1005   BUN 45 (H) 12/16/2015 1051   CREATININE 8.89 (H) 02/12/2021 1005   CALCIUM 8.9 02/12/2021 1005   GFRNONAA 7 (L) 02/12/2021 1005   GFRAA 9 (L) 09/27/2019 0951    COAGS: Lab  Results  Component Value Date   INR 1.4 (H) 02/12/2021  INR 1.1 02/04/2021   INR 1.2 07/28/2020     Non-Invasive Vascular Imaging:   LEFT Lower Extremity   Inflow: There is a minimal amount of calcified atherosclerotic plaque involving the normal caliber left common iliac artery, not resulting in a hemodynamically significant stenosis. The left internal and external iliac arteries are mildly diseased though patent and of normal caliber.   Outflow: There is a minimal amount of calcified atherosclerotic plaque involving the left common femoral artery, not resulting in hemodynamically significant stenosis.   The left deep femoral artery is patent and of normal caliber.   There is concentric predominantly calcified atherosclerotic plaque throughout the left superficial femoral artery not definitely resulting in hemodynamically significant stenosis, though note, evaluation degraded secondary to extensive mural calcifications.   There is a moderate to large amount of eccentric mixed calcified and noncalcified atherosclerotic plaque involving the left above knee popliteal artery resulting in tandem areas of at least 50% luminal narrowing (image 264, 287 and 293, series 5). The left below-knee popliteal artery appears patent without hemodynamically significant narrowing.   Runoff: Apparent three-vessel runoff to the left lower leg, though note, evaluation degraded secondary to significant mural calcifications. A definitive left-sided dorsalis pedis artery is not identified. No discrete lumen filling defects to suggest distal embolism.  Statin:  Yes.   Beta Blocker:  Yes.   Aspirin:  Yes.   ACEI:  No. ARB:  No. CCB use:  Yes   ASSESSMENT/PLAN: This is a 51 y.o. male with nonhealing left first toe ray amputation.  Wound bed has dry gangrene.  There is exposed bone.  The patient's vital signs are normal and there are no signs or symptoms of systemic infection or ascending  cellulitis needing immediate guillotine amputation.  Abubakar's peripheral vascular disease is defined as Rutherford 5/6 critical limb ischemia.  I had a long discussion with Brendan Campbell regarding the above.  Brendan Campbell has a nonpalpable pulse in the foot and therefore an ABI was ordered.  ABI will help guide the level of amputation.  Due to the significant tissue loss and ulcerations at the level of the foot, I do not believe a TMA would heal even if there was inline flow.  Brendan Campbell asked to have the most definitive amputation, and was okay should it need to be a below-knee amputation matching the other leg.  I will follow-up the results of his ankle-brachial index, and provide further recommendations accordingly. Heparin drip can be turned off as his critical limb ischemia is chronic. Patient is a high risk surgical candidate regardless of the procedure due to his ejection fraction which is estimated at 25 to 30%. Please continue aspirin daily, statin therapy.    Cassandria Santee MD MS [facility] [phone] 02/13/2021  12:57 PM

## 2021-02-13 NOTE — Assessment & Plan Note (Addendum)
-   due to missed HD sessions - s/p urgent HD on 02/12/21 - continue HD

## 2021-02-13 NOTE — Assessment & Plan Note (Addendum)
-   recent admission for MRSA bacteremia and LLE cellulitis - now has what appears to be dry gangrene of left medial foot. No DP seen on CTA and wound not likely to heal  - suspect he'll require further amputation; evaluation pending by vascular surgery (tentatively planned for [facility] on 02/16/21) -Follow-up ABIs ordered per vascular surgery - continue holding Eliquis in anticipation of surgery (see afib); per vascular anticoag not needed from this perspective due to chronicity but patient also on anticoag for afib so will continue heparin drip for that indication

## 2021-02-13 NOTE — Assessment & Plan Note (Signed)
-   diffuse rash/papules on back and scattered. Appears probably due to staph colonization and esrd - can consider outpt referral to derm

## 2021-02-13 NOTE — Assessment & Plan Note (Signed)
-   On Eliquis chronically at home.  Currently on hold for heparin drip in lieu of possible surgery -Continue Coreg

## 2021-02-13 NOTE — Hospital Course (Signed)
Mr. Lorusso is a 51 yo male with PMH sCHF, ESRD on HD, DMII, HLD, PVD, neuropathy, PTSD who presented to [facility] with worsening weakness/fatigue.   Of note he has been on a 6-week course of vancomycin with HD for recent diagnosis of MRSA bacteremia and left leg cellulitis. He states he went to HD this past Monday but "did not get Vanc" then he missed Wed and Fri sessions prior to admission. He required urgent HD on 02/12/21 at [facility].   He underwent further work-up of his left foot due to worsening pain and appearance of his wound.  CT angio showed multiple abnormalities and he was recommended for transfer to [facility] for further evaluation with vascular surgery.

## 2021-02-13 NOTE — Assessment & Plan Note (Signed)
-   Noted.  Has some minor skin abrasions and scabs

## 2021-02-13 NOTE — Progress Notes (Signed)
F Progress Note    Brendan Campbell   QQP:[id number]  DOB: 1970/07/29  DOA: 02/12/2021     1 PCP: Monico Blitz, MD  Initial CC: weakness, lethargy, left foot pain  Hospital Course: [name] is a 51 yo male with PMH sCHF, ESRD on HD, DMII, HLD, PVD, neuropathy, PTSD who presented to [facility] with worsening weakness/fatigue.   Of note he has been on a 6-week course of vancomycin with HD for recent diagnosis of MRSA bacteremia and left leg cellulitis. He states he went to HD this past Monday but "did not get Vanc" then he missed Wed and Fri sessions prior to admission. He required urgent HD on 02/12/21 at [facility].   He underwent further work-up of his left foot due to worsening pain and appearance of his wound.  CT angio showed multiple abnormalities and he was recommended for transfer to [facility] for further evaluation with vascular surgery.   Interval History:  Seen this afternoon in his room.  He was transferred from [facility] yesterday for urgent dialysis. He is mostly complaining of left foot discomfort otherwise he feels better after his session of dialysis yesterday.  Assessment & Plan: * Hyperkalemia- (present on admission) - due to missed HD sessions - s/p urgent HD on 02/12/21 - follow up BMP today, delay by lab  ESRD on hemodialysis [facility]) - patient reports 1/2 was last session of HD; missed Wed and Fri and required urgent HD at [facility] instead later on Friday - nephrology following, plan is to resume normal HD schedule, unless anything acute changes  Peripheral vascular disease ([place])- (present on admission) - recent admission for MRSA bacteremia and LLE cellulitis - now has what appears to be dry gangrene of left medial foot. No DP seen on CTA and wound not likely to heal  - suspect he'll require further amputation; evaluation pending by vascular surgery, but aware of patient's arrival to [facility] - follow up further rec's - continue holding Eliquis in anticipation of surgery;  continue heparin per pharmacy  MRSA bacteremia - s/p recent hospitalization and workup - followed by ID - recommended for 6 weeks Vanc with HD due to presence of AVF. Had negative TTE on workup - needs repeat BC 1 week after abx completed - missed abx doses on 1/2 and 1/4 per patient - continue vanc for now and will formally consult ID on Monday in case of need for further rec's   Rash - diffuse rash/papules on back and scattered. Appears probably due to staph colonization and esrd - can consider outpt referral to derm   Acute on chronic combined systolic and diastolic CHF (congestive heart failure) ([place])- (present on admission) - Continue aspirin, statin, Coreg  Chronic atrial fibrillation ([place])- (present on admission) - On Eliquis chronically at home.  Currently on hold for heparin drip in lieu of possible surgery -Continue Coreg  S/P below knee amputation, right ([place]) - Noted.  Has some minor skin abrasions and scabs  Hyperlipidemia- (present on admission) - Continue Lipitor  Type 2 diabetes mellitus with diabetic chronic kidney disease ([place])- (present on admission) - Last A1c 5.5% on 02/05/2021 - Continue diet control    Old records reviewed in assessment of this patient  Antimicrobials: Vanc  DVT prophylaxis: Heparin drip  Code Status:   Code Status: Full Code  Disposition Plan:   Status is: Inpt  Objective: Blood pressure 112/90, pulse 88, temperature 98.1 F (36.7 C), resp. rate 17, height 5\' 9"  (1.753 m),  weight 91.2 kg, SpO2 91 %.  Examination:  Physical Exam Constitutional:      Comments: Chronically ill-appearing adult man who appears older than stated age sitting up in bed in no distress  HENT:     Head: Normocephalic and atraumatic.     Mouth/Throat:     Mouth: Mucous membranes are moist.  Eyes:     Extraocular Movements: Extraocular movements intact.  Cardiovascular:     Rate and Rhythm: Normal rate and regular rhythm.     Heart sounds: Normal  heart sounds.  Pulmonary:     Effort: Pulmonary effort is normal. No respiratory distress.     Breath sounds: Normal breath sounds. No wheezing.  Abdominal:     General: Bowel sounds are normal. There is no distension.     Palpations: Abdomen is soft.     Tenderness: There is no abdominal tenderness.  Musculoskeletal:     Cervical back: Normal range of motion and neck supple.     Comments: Right BKA noted.  Left foot noted with appearance of dry gangrene involving distal medial aspect of his foot with amputated toes and deformed remaining toes.  Skin:    Comments: Severe scattered papules appreciated throughout the back.  Skin is dry with scattered abrasions and scabs throughout  Neurological:     Mental Status: He is oriented to person, place, and time.  Psychiatric:        Mood and Affect: Mood normal.        Behavior: Behavior normal.     Consultants:  Nephrology Vascular Surgery  Procedures:    Data Reviewed: I have personally reviewed labs and imaging studies    LOS: 1 day  Time spent: Greater than 50% of the 35 minute visit was spent in counseling/coordination of care for the patient as laid out in the A&P.   Dwyane Dee, MD [facility] 02/13/2021, 1:28 PM

## 2021-02-13 NOTE — Assessment & Plan Note (Addendum)
-   Last A1c 5.5% on 02/05/2021 - Continue diet control

## 2021-02-13 NOTE — Assessment & Plan Note (Addendum)
-   s/p recent hospitalization and workup - followed by ID - recommended for 6 weeks Vanc with HD due to presence of AVF. Had negative TTE on workup - needs repeat BC 1 week after abx completed - missed abx doses on 1/2 and 1/4 per patient - continue vanc for now and follow up ID consult for further rec's

## 2021-02-14 ENCOUNTER — Inpatient Hospital Stay (HOSPITAL_COMMUNITY): Payer: Medicaid Other

## 2021-02-14 DIAGNOSIS — E875 Hyperkalemia: Secondary | ICD-10-CM | POA: Diagnosis not present

## 2021-02-14 DIAGNOSIS — R7881 Bacteremia: Secondary | ICD-10-CM | POA: Diagnosis not present

## 2021-02-14 DIAGNOSIS — N186 End stage renal disease: Secondary | ICD-10-CM | POA: Diagnosis not present

## 2021-02-14 DIAGNOSIS — I482 Chronic atrial fibrillation, unspecified: Secondary | ICD-10-CM | POA: Diagnosis not present

## 2021-02-14 LAB — BASIC METABOLIC PANEL
Anion gap: 16 — ABNORMAL HIGH (ref 5–15)
BUN: 45 mg/dL — ABNORMAL HIGH (ref 6–20)
CO2: 21 mmol/L — ABNORMAL LOW (ref 22–32)
Calcium: 9.2 mg/dL (ref 8.9–10.3)
Chloride: 95 mmol/L — ABNORMAL LOW (ref 98–111)
Creatinine, Ser: 6.58 mg/dL — ABNORMAL HIGH (ref 0.61–1.24)
GFR, Estimated: 10 mL/min — ABNORMAL LOW (ref >60)
Glucose, Bld: 113 mg/dL — ABNORMAL HIGH (ref 70–99)
Potassium: 5.9 mmol/L — ABNORMAL HIGH (ref 3.5–5.1)
Sodium: 132 mmol/L — ABNORMAL LOW (ref 135–145)

## 2021-02-14 LAB — CBC WITH DIFFERENTIAL/PLATELET
Abs Immature Granulocytes: 0.06 10*3/uL (ref 0.00–0.07)
Basophils Absolute: 0.1 10*3/uL (ref 0.0–0.1)
Basophils Relative: 1 %
Eosinophils Absolute: 1.1 10*3/uL — ABNORMAL HIGH (ref 0.0–0.5)
Eosinophils Relative: 10 %
HCT: 29.9 % — ABNORMAL LOW (ref 39.0–52.0)
Hemoglobin: 9.9 g/dL — ABNORMAL LOW (ref 13.0–17.0)
Immature Granulocytes: 1 %
Lymphocytes Relative: 9 %
Lymphs Abs: 1 10*3/uL (ref 0.7–4.0)
MCH: 35.1 pg — ABNORMAL HIGH (ref 26.0–34.0)
MCHC: 33.1 g/dL (ref 30.0–36.0)
MCV: 106 fL — ABNORMAL HIGH (ref 80.0–100.0)
Monocytes Absolute: 0.5 10*3/uL (ref 0.1–1.0)
Monocytes Relative: 4 %
Neutro Abs: 8.4 10*3/uL — ABNORMAL HIGH (ref 1.7–7.7)
Neutrophils Relative %: 75 %
Platelets: 231 10*3/uL (ref 150–400)
RBC: 2.82 MIL/uL — ABNORMAL LOW (ref 4.22–5.81)
RDW: 18.1 % — ABNORMAL HIGH (ref 11.5–15.5)
WBC: 11.1 10*3/uL — ABNORMAL HIGH (ref 4.0–10.5)
nRBC: 0.3 % — ABNORMAL HIGH (ref 0.0–0.2)

## 2021-02-14 LAB — MAGNESIUM: Magnesium: 2 mg/dL (ref 1.7–2.4)

## 2021-02-14 LAB — HEPARIN LEVEL (UNFRACTIONATED): Heparin Unfractionated: 0.42 IU/mL (ref 0.30–0.70)

## 2021-02-14 MED ORDER — SODIUM ZIRCONIUM CYCLOSILICATE 10 G PO PACK
10.0000 g | PACK | Freq: Once | ORAL | Status: AC
Start: 2021-02-14 — End: 2021-02-14
  Administered 2021-02-14: 10 g via ORAL
  Filled 2021-02-14: qty 1

## 2021-02-14 MED ORDER — POVIDONE-IODINE 10 % EX SOLN
CUTANEOUS | Status: DC | PRN
  Filled 2021-02-14: qty 118

## 2021-02-14 MED ORDER — SODIUM ZIRCONIUM CYCLOSILICATE 10 G PO PACK
10.0000 g | PACK | Freq: Once | ORAL | Status: AC
  Administered 2021-02-14: 10 g via ORAL
  Filled 2021-02-14: qty 1

## 2021-02-14 MED ORDER — CHLORHEXIDINE GLUCONATE CLOTH 2 % EX PADS: 6.0000 | MEDICATED_PAD | Freq: Every day | CUTANEOUS | Status: DC

## 2021-02-14 NOTE — Progress Notes (Signed)
With assistance from the staff, patient did self sponge bath while sitting in a chair,while staffs made a bed for him.

## 2021-02-14 NOTE — Progress Notes (Signed)
Patient has been refusing Telemetry on,since last night.This Probation officer explained the purpose of tele,patient still refusing.

## 2021-02-14 NOTE — Progress Notes (Signed)
Advised patient not to pick on his right upper arm PIV.

## 2021-02-14 NOTE — Progress Notes (Addendum)
[facility] Progress Note   Subjective:   Patient seen and examined at bedside.  Reports pain in foot and nasal congestion.  Denies CP, fever, chills, abdominal pain, and n/v/d.    Objective Vitals:   02/13/21 0934 02/13/21 2052 02/14/21 0507 02/14/21 0938  BP: 112/90 116/85 (!) 120/95 106/74  Pulse: 88 87 83 73  Resp: 17 18 18 18   Temp: 98.1 F (36.7 C)  98.3 F (36.8 C) 98.3 F (36.8 C)  TempSrc:   Axillary   SpO2: 91% 100%  99%  Weight:      Height:       Physical Exam General:chronically ill appearing male in NAD Heart:RRR, no mrg Lungs:CTAB, nml WOB on RA Abdomen:obese, NTND Extremities:R BKA - no stump edema, 1+ LLE edema, forefoot with erythema and eschar over L great toe amputation site Skin - pale, diffuse excoriations in various stages of healing Dialysis Access: LU AVF +b/t  Filed Weights   02/12/21 0845 02/12/21 1520 02/12/21 1950  Weight: 93.4 kg 93.4 kg 91.2 kg    Intake/Output Summary (Last 24 hours) at 02/14/2021 1117 Last data filed at 02/14/2021 0800 Gross per 24 hour  Intake 1777.45 ml  Output 0 ml  Net 1777.45 ml    Additional Objective Labs: Basic Metabolic Panel: Recent Labs  Lab 02/12/21 1005 02/13/21 1209 02/13/21 1424 02/14/21 0821  NA 135  --  136 132*  K >7.5*  --  5.7* 5.9*  CL 95*  --  97* 95*  CO2 22  --  24 21*  GLUCOSE 92  --  84 113*  BUN 76*  --  39* 45*  CREATININE 8.89*  --  6.23* 6.58*  CALCIUM 8.9  --  9.2 9.2  PHOS  --  5.8*  --   --    Liver Function Tests: Recent Labs  Lab 02/12/21 1005  AST 17  ALT 12  ALKPHOS 83  BILITOT 1.0  PROT 7.1  ALBUMIN 3.3*   CBC: Recent Labs  Lab 02/12/21 1005 02/13/21 0304 02/14/21 0821  WBC 8.3 9.6 11.1*  NEUTROABS 5.7  --  8.4*  HGB 11.3* 9.7* 9.9*  HCT 35.2* 29.2* 29.9*  MCV 108.3* 103.5* 106.0*  PLT 233 198 231   CBG: Recent Labs  Lab 02/12/21 2214  GLUCAP 101*    Studies/Results: CT ANGIO AO+BIFEM W & OR WO CONTRAST  Result Date:  02/12/2021 CLINICAL DATA:  Left foot pain for the past 3 weeks. Concern for ischemia affecting the left foot. EXAM: CT ANGIOGRAPHY OF ABDOMINAL AORTA WITH ILIOFEMORAL RUNOFF TECHNIQUE: Multidetector CT imaging of the abdomen, pelvis and lower extremities was performed using the standard protocol during bolus administration of intravenous contrast. Multiplanar CT image reconstructions and MIPs were obtained to evaluate the vascular anatomy. CONTRAST:  174mL OMNIPAQUE IOHEXOL 350 MG/ML SOLN COMPARISON:  None. FINDINGS: VASCULAR Aorta: Moderate amount of calcified atherosclerotic plaque within normal caliber abdominal aorta, not resulting in a hemodynamically significant stenosis. No evidence of abdominal aortic dissection or perivascular stranding. Celiac: Widely patent without hemodynamically significant narrowing. SMA: Widely patent without hemodynamically significant narrowing. A replaced right hepatic artery is incidentally noted to arise from the proximal SMA. The distal tributaries of the SMA appear widely patent without discrete lumen filling defect to suggest distal embolism. Renals: Duplicated left renal arteries, nearly co-dominant. All renal arteries are heavily diseased with tandem areas of suspected hemodynamically significant narrowings, an expected finding given end-stage renal disease and associated bilateral renal atrophy. IMA: Diseased at its  origin though remains patent with collateralized arterial supply from the SMA. _________________________________________________________ RIGHT Lower Extremity Inflow: There is a minimal amount of calcified atherosclerotic plaque involving the normal caliber right common iliac artery, not resulting in a hemodynamically significant stenosis. The right internal and external iliac arteries are mildly diseased though patent and of normal caliber. Outflow: There is mixed calcified and noncalcified atherosclerotic plaque involving the right common femoral artery, not  resulting in hemodynamically significant stenosis. The right deep femoral artery is disease though patent of normal caliber. There is circumferential predominantly calcified atherosclerotic plaque throughout the right superficial femoral artery, which ultimately occludes at the level of the adductor canal. Both the right above and below-knee popliteal artery is occluded. Runoff: There is no significant tibial runoff to the residual below-knee amputation, though evaluation degraded secondary to extensive mural calcifications. _________________________________________________________ LEFT Lower Extremity Inflow: There is a minimal amount of calcified atherosclerotic plaque involving the normal caliber left common iliac artery, not resulting in a hemodynamically significant stenosis. The left internal and external iliac arteries are mildly diseased though patent and of normal caliber. Outflow: There is a minimal amount of calcified atherosclerotic plaque involving the left common femoral artery, not resulting in hemodynamically significant stenosis. The left deep femoral artery is patent and of normal caliber. There is concentric predominantly calcified atherosclerotic plaque throughout the left superficial femoral artery not definitely resulting in hemodynamically significant stenosis, though note, evaluation degraded secondary to extensive mural calcifications. There is a moderate to large amount of eccentric mixed calcified and noncalcified atherosclerotic plaque involving the left above knee popliteal artery resulting in tandem areas of at least 50% luminal narrowing (image 264, 287 and 293, series 5). The left below-knee popliteal artery appears patent without hemodynamically significant narrowing. Runoff: Apparent three-vessel runoff to the left lower leg, though note, evaluation degraded secondary to significant mural calcifications. A definitive left-sided dorsalis pedis artery is not identified. No discrete  lumen filling defects to suggest distal embolism. Veins: Normal appearance of the IVC and pelvic venous system on this arterial phase examination. Review of the MIP images confirms the above findings. _________________________________________________________ _________________________________________________________ NON-VASCULAR Evaluation of abdominal organs is limited to the arterial phase of enhancement. Lower chest: Limited visualization of the lower thorax is negative for focal airspace opacity though does demonstrate mild interstitial thickening. No discrete focal airspace opacities. No pleural effusion. Cardiomegaly. Coronary artery calcifications. No pericardial effusion. Hepatobiliary: Normal hepatic contour. There is diffuse decreased attenuation of the hepatic parenchyma suggestive of hepatic steatosis. No discrete hyperenhancing hepatic lesions. Post cholecystectomy. No intra or extrahepatic biliary duct dilatation. Trace amount of perihepatic ascites. Pancreas: Pancreas is largely fatty replaced. Spleen: Normal appearance of the spleen. Adrenals/Urinary Tract: The bilateral kidneys are markedly atrophic compatible with known history of end-stage renal disease. No urinary obstruction. There is mild thickening the left adrenal gland without discrete nodule. Normal appearance of the right adrenal gland. Diffuse thickening the urinary bladder wall, likely accentuated due to underdistention. Stomach/Bowel: Large colonic stool burden, particularly within the rectal vault. No evidence of enteric obstruction. Normal appearance of the terminal ileum and the retrocecal appendix. No hiatal hernia. No discrete areas of bowel wall thickening. No pneumoperitoneum, pneumatosis or portal venous gas. Lymphatic: No bulky retroperitoneal, mesenteric, pelvic or inguinal lymphadenopathy. Reproductive: Normal appearance the prostate gland. There is a small amount of fluid within the pelvic cul-de-sac. Other: Apparent 1.3 cm  soft tissue ulcer involving the medial mid aspect of the left foot (image 459, series 5). No discrete areas of  osteolysis to suggest osteomyelitis. Diffuse body wall anasarca. Musculoskeletal: No definite acute or aggressive osseous abnormalities. There is diffuse increased sclerosis of the imaged osseous structures suggestive of renal osteodystrophy. Prominent Schmorl's node are seen involving several vertebral bodies, most conspicuously involving L2. Patient is post right-sided below-knee amputation as well as amputation of the first and second digits. IMPRESSION: VASCULAR 1. Large amount of atherosclerotic plaque within normal caliber abdominal aorta, not resulting in a hemodynamically significant stenosis. Aortic aneurysm NOS (ICD10-I71.9). 2. Post right below-knee amputation with age-indeterminate though presumably chronic occlusion of the distal aspect of the right superficial femoral artery and the popliteal artery with no significant runoff to the residual stump, though evaluation degraded secondary to extensive mural calcifications. 3. Suspected tandem areas of hemodynamically significant narrowing involving the left above knee popliteal artery. 4. Apparent three-vessel runoff to the left lower leg, though note, evaluation degraded secondary to extensive mural calcifications. A discrete left-sided dorsalis pedis artery is not identified. No discrete intraluminal filling defects to suggest distal embolism. NON-VASCULAR 1. Apparent 1.3 cm soft tissue ulcer about the medial mid aspect of the left foot without discrete area of osteolysis to suggest osteomyelitis. Further evaluation with MRI could be performed as indicated. 2. Atrophic kidneys compatible with known history of end-stage renal disease. 3. Cardiomegaly with diffuse body wall anasarca and stigmata of pulmonary edema within the imaged lung bases. No pleural effusions. Electronically Signed   By: Sandi Mariscal M.D.   On: 02/12/2021 15:16     Medications:  sodium chloride     sodium chloride     sodium chloride     heparin 1,500 Units/hr (02/14/21 0014)   vancomycin 1,000 mg (02/12/21 1842)    aspirin  81 mg Oral Daily   [START ON 02/15/2021] atorvastatin  10 mg Oral Q M,W,F-1800   calcium acetate  667 mg Oral TID WC   calcium carbonate  800 mg of elemental calcium Oral BID   carvedilol  3.125 mg Oral BID WC   [START ON 02/15/2021] cholecalciferol  1,000 Units Oral Q M,W,F   [START ON 02/15/2021] darbepoetin (ARANESP) injection - DIALYSIS  40 mcg Intravenous Q Mon-HD   pantoprazole  20 mg Oral Daily   sodium chloride flush  3 mL Intravenous Q12H   sodium chloride flush  3 mL Intravenous Q12H    Dialysis Orders: [facility]  on MWF  4h 40min  95 kg   1K/2.25 bath  Hep 1000+ 500u/hr  LUE AVG  300/500   - prosthetic weighs 1.6 kg  - Epogen 1000   Units IV/HD     Assessment/Plan:  Hyperkalemia - due to noncompliance with HD.  Improved after urgent HD on admit. On 1K bath as outpatient. K 5.9 this AM, will give dose lokelma.   MRSA bacteremia - missed 1 week of IV vancomycin. Resume IV vanc.   PAD - Plan fur further amputation this week, mostly likely TMA.   ESRD -  Next HD on 02/15/21 per regular schedule.   Hypertension/volume  - Blood pressure at goal. Lower extremity edema noted with no respiratory distress.  UF as tolerated.  Monitor closely.   Anemia  - Hgb 9.9, on ESA. Will order to give with HD Monday.   Metabolic bone disease -   Calcium in goal. Check phos.  Continue home meds.  Not on VDRA.   Nutrition - renal diet, carb modified with fluid restrictions.   Jen Mow, PA-C [facility] 02/14/2021,11:17 AM  LOS: 2 days  Seen and examined independently.  Agree with note and exam as documented above by physician extender and as noted here.  We discussed that I recommended HD today and he doesn't want to go; "it's Sunday - you should have done it yesterday" and "isn't there some drink that you  can give me?"   General adult male in bed in no acute distress HEENT normocephalic atraumatic extraocular movements intact sclera anicteric Neck supple trachea midline Lungs clear to auscultation bilaterally normal work of breathing at rest  Heart S1S2 no rub Abdomen soft nontender nondistended/obese habitus Extremities right BKA wrapped; left foot is wrapped edema appreciated   Neuro alert and oriented x 3 provides hx and follows commands Psych no anxiety or agitation; insight impaired Access LUE AVG    Hyperkalemia - I have recommended HD today and he declines - HD tomorrow first shift - lokelma BID today    ESRD - MWF schedule for HD - he is noncompliant  - HD first shift    MRSA bacteremia  - abx per primary - on vanc    PAD - concern for need for left lower extremity amputation; per vascular. He needs HD pre-operatively for K and has refused tx today   HTN  - optimize volume with HD   Anemia CKD - ESA as above   Metabolic bone disease - with diffuse rash. Likely contributing in some aspect to rash.   Claudia Desanctis, MD 02/14/2021  1:35 PM

## 2021-02-14 NOTE — Progress Notes (Signed)
F Progress Note    Brendan Campbell   HOZ:[id number]  DOB: 02/15/1970  DOA: 02/12/2021     2 PCP: Brendan Blitz, MD  Initial CC: weakness, lethargy, left foot pain  Hospital Course: [name] is a 51 yo male with PMH sCHF, ESRD on HD, DMII, HLD, PVD, neuropathy, PTSD who presented to [facility] with worsening weakness/fatigue.   Of note he has been on a 6-week course of vancomycin with HD for recent diagnosis of MRSA bacteremia and left leg cellulitis. He states he went to HD this past Monday but "did not get Vanc" then he missed Wed and Fri sessions prior to admission. He required urgent HD on 02/12/21 at [facility].   He underwent further work-up of his left foot due to worsening pain and appearance of his wound.  CT angio showed multiple abnormalities and he was recommended for transfer to [facility] for further evaluation with vascular surgery.   Interval History:  No events overnight.  Patient had rubbed calamine lotion on himself to help with his pruritus.  He understands next steps are for obtaining ABIs then further discussion with vascular surgery regarding probable amputation.  Assessment & Plan: * Hyperkalemia- (present on admission) - due to missed HD sessions - s/p urgent HD on 02/12/21 -Labs again delayed this morning.  K5.9, 2 doses of Lokelma ordered per nephrology   ESRD on hemodialysis [facility]) - patient reports 1/2 was last session of HD; missed Wed and Fri and required urgent HD at [facility] instead later on Friday - nephrology following, plan is to resume normal HD schedule on 02/15/2021 - Dose of Lokelma being given today for potassium 5.9  Peripheral vascular disease ([place])- (present on admission) - recent admission for MRSA bacteremia and LLE cellulitis - now has what appears to be dry gangrene of left medial foot. No DP seen on CTA and wound not likely to heal  - suspect he'll require further amputation; evaluation pending by vascular surgery -Follow-up ABIs ordered per  vascular surgery to help decide on amputation height - continue holding Eliquis in anticipation of surgery (see afib); per vascular anticoag not needed from this perspective due to chronicity but patient also on anticoag for afib so will continue heparin drip for that indication  MRSA bacteremia - s/p recent hospitalization and workup - followed by ID - recommended for 6 weeks Vanc with HD due to presence of AVF. Had negative TTE on workup - needs repeat BC 1 week after abx completed - missed abx doses on 1/2 and 1/4 per patient - continue vanc for now and will formally consult ID on Monday in case of need for further rec's   Chronic atrial fibrillation ([place])- (present on admission) - On Eliquis chronically at home.  Currently on hold for heparin drip in lieu of possible surgery -Continue Coreg  Rash - diffuse rash/papules on back and scattered. Appears probably due to staph colonization and esrd - can consider outpt referral to derm   Acute on chronic combined systolic and diastolic CHF (congestive heart failure) ([place])- (present on admission) - Continue aspirin, statin, Coreg  S/P below knee amputation, right ([place]) - Noted.  Has some minor skin abrasions and scabs  Hyperlipidemia- (present on admission) - Continue Lipitor  Type 2 diabetes mellitus with diabetic chronic kidney disease ([place])- (present on admission) - Last A1c 5.5% on 02/05/2021 - Continue diet control    Old records reviewed in assessment of this patient  Antimicrobials: Vanc  DVT prophylaxis: Heparin  drip  Code Status:   Code Status: Full Code  Disposition Plan:   Status is: Inpt  Objective: Blood pressure 106/74, pulse 73, temperature 98.3 F (36.8 C), resp. rate 18, height 5\' 9"  (1.753 m), weight 91.2 kg, SpO2 99 %.  Examination:  Physical Exam Constitutional:      Comments: Chronically ill-appearing adult man who appears older than stated age sitting up in bed in no distress  HENT:     Head:  Normocephalic and atraumatic.     Mouth/Throat:     Mouth: Mucous membranes are moist.  Eyes:     Extraocular Movements: Extraocular movements intact.  Cardiovascular:     Rate and Rhythm: Normal rate and regular rhythm.     Heart sounds: Normal heart sounds.  Pulmonary:     Effort: Pulmonary effort is normal. No respiratory distress.     Breath sounds: Normal breath sounds. No wheezing.  Abdominal:     General: Bowel sounds are normal. There is no distension.     Palpations: Abdomen is soft.     Tenderness: There is no abdominal tenderness.  Musculoskeletal:     Cervical back: Normal range of motion and neck supple.     Comments: Right BKA noted.  Left foot noted with appearance of dry gangrene involving distal medial aspect of his foot with amputated toes and deformed remaining toes.  Skin:    Comments: Severe scattered papules appreciated throughout the back.  Skin is dry with scattered abrasions and scabs throughout  Neurological:     Mental Status: He is oriented to person, place, and time.  Psychiatric:        Mood and Affect: Mood normal.        Behavior: Behavior normal.     Consultants:  Nephrology Vascular Surgery  Procedures:    Data Reviewed: I have personally reviewed labs and imaging studies    LOS: 2 days  Time spent: Greater than 50% of the 35 minute visit was spent in counseling/coordination of care for the patient as laid out in the A&P.   Dwyane Dee, MD [facility] 02/14/2021, 1:39 PM

## 2021-02-14 NOTE — Progress Notes (Signed)
Called into patient's room, found splattered bright red blood splattered all around his bed and self. Blood coming from pulled out  right upper arm PIV ,pressure dressing applied.Cleaned the patient and made up a new bed .Explained to patient that he is on heparin ,its purpose and its side effect.Will monitor.

## 2021-02-14 NOTE — Progress Notes (Addendum)
ANTICOAGULATION CONSULT NOTE - Follow Up Consult  Pharmacy Consult for Heparin Indication:  Ischemic LE  Allergies  Allergen Reactions   Tape Other (See Comments)    Pulls skin off   Chlorhexidine Gluconate [Chlorhexidine] Rash    Patient Measurements: Height: 5\' 9"  (175.3 cm) Weight: 91.2 kg (201 lb 1 oz) IBW/kg (Calculated) : 70.7 Heparin Dosing Weight:  93.4 kg   Vital Signs: Temp: 98.3 F (36.8 C) (01/08 0507) Temp Source: Axillary (01/08 0507) BP: 120/95 (01/08 0507) Pulse Rate: 83 (01/08 0507)  Labs: Recent Labs    02/12/21 1005 02/12/21 1234 02/13/21 0304 02/13/21 0308 02/13/21 1058 02/13/21 1424 02/13/21 2200 02/14/21 0821  HGB 11.3*  --  9.7*  --   --   --   --  9.9*  HCT 35.2*  --  29.2*  --   --   --   --  29.9*  PLT 233  --  198  --   --   --   --  231  APTT  --    < > 69*  --  157*  --  91*  --   LABPROT 16.9*  --   --   --   --   --   --   --   INR 1.4*  --   --   --   --   --   --   --   HEPARINUNFRC <0.10*  --   --    < > 0.41  --  0.21* 0.42  CREATININE 8.89*  --   --   --   --  6.23*  --  6.58*   < > = values in this interval not displayed.     Estimated Creatinine Clearance: 15.2 mL/min (A) (by C-G formula based on SCr of 6.58 mg/dL (H)).   Assessment: Eliquis PTA, last dose 02/10/21 at 1700 for LLE ischemia. Switched to dosing heparin per pharmacy.   Heparin level therapeutic at 0.41 with heparin running at 1,500 units/h. CBC stable, with no s/sx of bleeding noted.    Goal of Therapy:  Heparin level 0.3-0.7 units/ml Monitor platelets by anticoagulation protocol: Yes   Plan:  Continue heparin drip at 1500 units/hr  8h confirmatory HL  Daily HL, and CBC   Adria Dill, PharmD PGY-1 Acute Care Resident  02/14/2021 9:16 AM

## 2021-02-15 ENCOUNTER — Inpatient Hospital Stay (HOSPITAL_COMMUNITY): Payer: Medicaid Other

## 2021-02-15 DIAGNOSIS — E875 Hyperkalemia: Secondary | ICD-10-CM | POA: Diagnosis not present

## 2021-02-15 DIAGNOSIS — I482 Chronic atrial fibrillation, unspecified: Secondary | ICD-10-CM | POA: Diagnosis not present

## 2021-02-15 DIAGNOSIS — N186 End stage renal disease: Secondary | ICD-10-CM | POA: Diagnosis not present

## 2021-02-15 DIAGNOSIS — L03119 Cellulitis of unspecified part of limb: Secondary | ICD-10-CM

## 2021-02-15 DIAGNOSIS — R7881 Bacteremia: Secondary | ICD-10-CM | POA: Diagnosis not present

## 2021-02-15 LAB — RENAL FUNCTION PANEL
Albumin: 2.7 g/dL — ABNORMAL LOW (ref 3.5–5.0)
Anion gap: 18 — ABNORMAL HIGH (ref 5–15)
BUN: 49 mg/dL — ABNORMAL HIGH (ref 6–20)
CO2: 25 mmol/L (ref 22–32)
Calcium: 10 mg/dL (ref 8.9–10.3)
Chloride: 89 mmol/L — ABNORMAL LOW (ref 98–111)
Creatinine, Ser: 7.41 mg/dL — ABNORMAL HIGH (ref 0.61–1.24)
GFR, Estimated: 8 mL/min — ABNORMAL LOW (ref >60)
Glucose, Bld: 90 mg/dL (ref 70–99)
Phosphorus: 5.6 mg/dL — ABNORMAL HIGH (ref 2.5–4.6)
Potassium: 5.3 mmol/L — ABNORMAL HIGH (ref 3.5–5.1)
Sodium: 132 mmol/L — ABNORMAL LOW (ref 135–145)

## 2021-02-15 LAB — BASIC METABOLIC PANEL
Anion gap: 16 — ABNORMAL HIGH (ref 5–15)
BUN: 47 mg/dL — ABNORMAL HIGH (ref 6–20)
CO2: 25 mmol/L (ref 22–32)
Calcium: 9.7 mg/dL (ref 8.9–10.3)
Chloride: 93 mmol/L — ABNORMAL LOW (ref 98–111)
Creatinine, Ser: 7.43 mg/dL — ABNORMAL HIGH (ref 0.61–1.24)
GFR, Estimated: 8 mL/min — ABNORMAL LOW (ref >60)
Glucose, Bld: 82 mg/dL (ref 70–99)
Potassium: 5.3 mmol/L — ABNORMAL HIGH (ref 3.5–5.1)
Sodium: 134 mmol/L — ABNORMAL LOW (ref 135–145)

## 2021-02-15 LAB — HEPARIN LEVEL (UNFRACTIONATED)
Heparin Unfractionated: 0.24 IU/mL — ABNORMAL LOW (ref 0.30–0.70)
Heparin Unfractionated: <0.1 IU/mL — ABNORMAL LOW (ref 0.30–0.70)

## 2021-02-15 LAB — CBC WITH DIFFERENTIAL/PLATELET
Abs Immature Granulocytes: 0.06 10*3/uL (ref 0.00–0.07)
Basophils Absolute: 0.1 10*3/uL (ref 0.0–0.1)
Basophils Relative: 1 %
Eosinophils Absolute: 1.1 10*3/uL — ABNORMAL HIGH (ref 0.0–0.5)
Eosinophils Relative: 9 %
HCT: 28.7 % — ABNORMAL LOW (ref 39.0–52.0)
Hemoglobin: 9.5 g/dL — ABNORMAL LOW (ref 13.0–17.0)
Immature Granulocytes: 1 %
Lymphocytes Relative: 8 %
Lymphs Abs: 1 10*3/uL (ref 0.7–4.0)
MCH: 34.7 pg — ABNORMAL HIGH (ref 26.0–34.0)
MCHC: 33.1 g/dL (ref 30.0–36.0)
MCV: 104.7 fL — ABNORMAL HIGH (ref 80.0–100.0)
Monocytes Absolute: 0.7 10*3/uL (ref 0.1–1.0)
Monocytes Relative: 5 %
Neutro Abs: 9.9 10*3/uL — ABNORMAL HIGH (ref 1.7–7.7)
Neutrophils Relative %: 76 %
Platelets: 233 10*3/uL (ref 150–400)
RBC: 2.74 MIL/uL — ABNORMAL LOW (ref 4.22–5.81)
RDW: 17.9 % — ABNORMAL HIGH (ref 11.5–15.5)
WBC: 12.8 10*3/uL — ABNORMAL HIGH (ref 4.0–10.5)
nRBC: 0.3 % — ABNORMAL HIGH (ref 0.0–0.2)

## 2021-02-15 LAB — CBC
HCT: 28.7 % — ABNORMAL LOW (ref 39.0–52.0)
Hemoglobin: 9.1 g/dL — ABNORMAL LOW (ref 13.0–17.0)
MCH: 33.6 pg (ref 26.0–34.0)
MCHC: 31.7 g/dL (ref 30.0–36.0)
MCV: 105.9 fL — ABNORMAL HIGH (ref 80.0–100.0)
Platelets: 230 10*3/uL (ref 150–400)
RBC: 2.71 MIL/uL — ABNORMAL LOW (ref 4.22–5.81)
RDW: 17.9 % — ABNORMAL HIGH (ref 11.5–15.5)
WBC: 12.6 10*3/uL — ABNORMAL HIGH (ref 4.0–10.5)
nRBC: 0.2 % (ref 0.0–0.2)

## 2021-02-15 LAB — IRON AND TIBC
Iron: 41 ug/dL — ABNORMAL LOW (ref 45–182)
Saturation Ratios: 21 % (ref 17.9–39.5)
TIBC: 192 ug/dL — ABNORMAL LOW (ref 250–450)
UIBC: 151 ug/dL

## 2021-02-15 LAB — MAGNESIUM: Magnesium: 2.2 mg/dL (ref 1.7–2.4)

## 2021-02-15 LAB — VANCOMYCIN, RANDOM: Vancomycin Rm: 22

## 2021-02-15 LAB — FERRITIN: Ferritin: 860 ng/mL — ABNORMAL HIGH (ref 24–336)

## 2021-02-15 MED ORDER — SODIUM CHLORIDE 0.9 % IV SOLN
2.0000 g | INTRAVENOUS | Status: DC
  Administered 2021-02-16: 2 g via INTRAVENOUS
  Filled 2021-02-15 (×2): qty 20

## 2021-02-15 NOTE — Progress Notes (Signed)
F Progress Note    Brendan Campbell   GGE:[id number]  DOB: July 12, 1970  DOA: 02/12/2021     3 PCP: Monico Blitz, MD  Initial CC: weakness, lethargy, left foot pain  Hospital Course: Brendan Campbell is a 51 yo male with PMH sCHF, ESRD on HD, DMII, HLD, PVD, neuropathy, PTSD who presented to [facility] with worsening weakness/fatigue.   Of note he has been on a 6-week course of vancomycin with HD for recent diagnosis of MRSA bacteremia and left leg cellulitis. He states he went to HD this past Monday but "did not get Vanc" then he missed Wed and Fri sessions prior to admission. He required urgent HD on 02/12/21 at [facility].   He underwent further work-up of his left foot due to worsening pain and appearance of his wound.  CT angio showed multiple abnormalities and he was recommended for transfer to [facility] for further evaluation with vascular surgery.   Interval History:  No events overnight.  He has agreed to L BKA with vascular surgery tomorrow. He does not wish for rehab however and wants to go home at time of discharge but [facility]   Assessment & Plan: * Hyperkalemia- (present on admission) - due to missed HD sessions - s/p urgent HD on 02/12/21 - continue HD  ESRD on hemodialysis [facility]) - patient reports 1/2 was last session of HD; missed Wed and Fri and required urgent HD at [facility] instead later on Friday - nephrology following, plan is to resume normal HD schedule on 02/15/2021  Peripheral vascular disease ([place])- (present on admission) - recent admission for MRSA bacteremia and LLE cellulitis - now has what appears to be dry gangrene of left medial foot. No DP seen on CTA and wound not likely to heal  - suspect he'll require further amputation; evaluation pending by vascular surgery (tentatively planned for [facility]KA on 02/16/21) -Follow-up ABIs ordered per vascular surgery - continue holding Eliquis in anticipation of surgery (see afib); per vascular anticoag not needed from this  perspective due to chronicity but patient also on anticoag for afib so will continue heparin drip for that indication  MRSA bacteremia - s/p recent hospitalization and workup - followed by ID - recommended for 6 weeks Vanc with HD due to presence of AVF. Had negative TTE on workup - needs repeat BC 1 week after abx completed - missed abx doses on 1/2 and 1/4 per patient - continue vanc for now and follow up ID consult for further rec's  Chronic atrial fibrillation ([place])- (present on admission) - On Eliquis chronically at home.  Currently on hold for heparin drip in lieu of possible surgery -Continue Coreg  Rash - diffuse rash/papules on back and scattered. Appears probably due to staph colonization and esrd - can consider outpt referral to derm   Acute on chronic combined systolic and diastolic CHF (congestive heart failure) ([place])- (present on admission) - Continue aspirin, statin, Coreg  S/P below knee amputation, right ([place]) - Noted.  Has some minor skin abrasions and scabs  Hyperlipidemia- (present on admission) - Continue Lipitor  Type 2 diabetes mellitus with diabetic chronic kidney disease ([place])- (present on admission) - Last A1c 5.5% on 02/05/2021 - Continue diet control    Old records reviewed in assessment of this patient  Antimicrobials: Vanc  DVT prophylaxis: Heparin drip  Code Status:   Code Status: Full Code  Disposition Plan:   Status is: Inpt  Objective: Blood pressure (!) 94/42, pulse 88,  temperature (!) 97.5 F (36.4 C), temperature source Temporal, resp. rate 16, height 5\' 9"  (1.753 m), weight 102.7 kg, SpO2 95 %.  Examination:  Physical Exam Constitutional:      Comments: Chronically ill-appearing adult man who appears older than stated age sitting up in bed in no distress  HENT:     Head: Normocephalic and atraumatic.     Mouth/Throat:     Mouth: Mucous membranes are moist.  Eyes:     Extraocular Movements: Extraocular movements intact.   Cardiovascular:     Rate and Rhythm: Normal rate and regular rhythm.     Heart sounds: Normal heart sounds.  Pulmonary:     Effort: Pulmonary effort is normal. No respiratory distress.     Breath sounds: Normal breath sounds. No wheezing.  Abdominal:     General: Bowel sounds are normal. There is no distension.     Palpations: Abdomen is soft.     Tenderness: There is no abdominal tenderness.  Musculoskeletal:     Cervical back: Normal range of motion and neck supple.     Comments: Right BKA noted.  Left foot noted with appearance of dry gangrene involving distal medial aspect of his foot with amputated toes and deformed remaining toes.  Skin:    Comments: Severe scattered papules appreciated throughout the back.  Skin is dry with scattered abrasions and scabs throughout  Neurological:     Mental Status: He is oriented to person, place, and time.  Psychiatric:        Mood and Affect: Mood normal.        Behavior: Behavior normal.     Consultants:  Nephrology Vascular Surgery ID  Procedures:    Data Reviewed: I have personally reviewed labs and imaging studies    LOS: 3 days  Time spent: Greater than 50% of the 35 minute visit was spent in counseling/coordination of care for the patient as laid out in the A&P.   Dwyane Dee, MD [facility] 02/15/2021, 1:51 PM

## 2021-02-15 NOTE — Progress Notes (Signed)
°  [facility] Progress Note   Subjective:   Patient seen on HD, no new c/o  Objective Vitals:   02/15/21 1130 02/15/21 1200 02/15/21 1230 02/15/21 1233  BP: (!) 93/29 (!) 100/38 110/89 (!) 94/42  Pulse: 89 86 (!) 44 88  Resp:    16  Temp:    (!) 97.5 F (36.4 C)  TempSrc:    Temporal  SpO2:    95%  Weight:    102.7 kg  Height:       Physical Exam General:chronically ill appearing male in NAD Heart:RRR, no mrg Lungs:CTAB, nml WOB on RA Abdomen:obese, NTND Extremities:R BKA - no stump edema, 1+ LLE edema, forefoot with erythema and eschar over L great toe amputation site Skin - pale, diffuse excoriations in various stages of healing Dialysis Access: LU AVF +b/t   OP HD: [facility] MWF  4h 43min  95 kg   1K/2.25 bath  Hep 1000+ 500u/hr  LUE AVG  300/500   - prosthetic weighs 1.6 kg  - Epogen 1000   Units IV/HD     Assessment/Plan:  PAD - Plan fur further amputation this week, mostly likely TMA.  MRSA bacteremia - missed 1 week of IV vancomycin. Resume IV vanc.   ESRD -  Next HD today, in progress.   Hypertension/volume  - Blood pressure at goal. Lower extremity edema noted with no respiratory issues. Is up 7 kg today post HD, not sure these wt's are accurate. Max UF next HD on Wed.   Anemia  - Hgb 9.9, on ESA. Will order to give with HD Monday.   Metabolic bone disease -   Calcium in goal. Check phos.  Continue home meds.  Not on VDRA.   Nutrition - renal diet, carb modified with fluid restrictions.   Kelly Splinter, MD 02/15/2021, 1:50 PM       Medications:  sodium chloride     cefTRIAXone (ROCEPHIN)  IV     heparin 1,700 Units/hr (02/15/21 0919)   vancomycin 1,000 mg (02/15/21 1105)    aspirin  81 mg Oral Daily   atorvastatin  10 mg Oral Q M,W,F-1800   calcium acetate  667 mg Oral TID WC   calcium carbonate  800 mg of elemental calcium Oral BID   carvedilol  3.125 mg Oral BID WC   cholecalciferol  1,000 Units Oral Q M,W,F   darbepoetin (ARANESP)  injection - DIALYSIS  40 mcg Intravenous Q Mon-HD   pantoprazole  20 mg Oral Daily   sodium chloride flush  3 mL Intravenous Q12H   sodium chloride flush  3 mL Intravenous Q12H

## 2021-02-15 NOTE — Progress Notes (Addendum)
Progress Note    02/15/2021 9:55 AM * No surgery found *  Subjective:  no complaints this morning   Vitals:   02/15/21 0902 02/15/21 0930  BP: (!) 102/46 (!) 101/37  Pulse:  93  Resp:    Temp:    SpO2:     Physical Exam: Lungs:  non labored Extremities:  L foot necrotic wound with surrounding erythema Neurologic: A&O  CBC    Component Value Date/Time   WBC 12.6 (H) 02/15/2021 0924   RBC 2.71 (L) 02/15/2021 0924   HGB 9.1 (L) 02/15/2021 0924   HGB 10.4 (L) 06/02/2016 1107   HCT 28.7 (L) 02/15/2021 0924   HCT 32.9 (L) 06/02/2016 1107   PLT 230 02/15/2021 0924   PLT 389 (H) 06/02/2016 1107   MCV 105.9 (H) 02/15/2021 0924   MCV 94 06/02/2016 1107   MCH 33.6 02/15/2021 0924   MCHC 31.7 02/15/2021 0924   RDW 17.9 (H) 02/15/2021 0924   RDW 16.0 (H) 06/02/2016 1107   LYMPHSABS 1.0 02/15/2021 0558   LYMPHSABS 2.9 06/02/2016 1107   MONOABS 0.7 02/15/2021 0558   EOSABS 1.1 (H) 02/15/2021 0558   EOSABS 0.5 (H) 06/02/2016 1107   BASOSABS 0.1 02/15/2021 0558   BASOSABS 0.1 06/02/2016 1107    BMET    Component Value Date/Time   NA 132 (L) 02/15/2021 0923   NA 142 12/16/2015 1051   K 5.3 (H) 02/15/2021 0923   CL 89 (L) 02/15/2021 0923   CO2 25 02/15/2021 0923   GLUCOSE 90 02/15/2021 0923   BUN 49 (H) 02/15/2021 0923   BUN 45 (H) 12/16/2015 1051   CREATININE 7.41 (H) 02/15/2021 0923   CALCIUM 10.0 02/15/2021 0923   GFRNONAA 8 (L) 02/15/2021 0923   GFRAA 9 (L) 09/27/2019 0951    INR    Component Value Date/Time   INR 1.4 (H) 02/13/2021 0304     Intake/Output Summary (Last 24 hours) at 02/15/2021 0955 Last data filed at 02/15/2021 0800 Gross per 24 hour  Intake 1220 ml  Output 0 ml  Net 1220 ml     Assessment/Plan:  51 y.o. male with extensive L foot wound  L ABI is still pending however he has an extensive left foot wound.  He has been offered a left below the knee amputation by Dr. Virl Cagey which can be scheduled for tomorrow 1/10.  He is willing to  proceed with this surgery.  He however is not willing to go to rehab or SNF post operative.  He is ok with HH PT to assist post operatively.  We will tentatively schedule him for left below the knee amputation tomorrow with Dr. Virl Cagey.  Consent orderd.  Keep him NPO past midnight.  He does not need heparin from a vascular surgery standpoint however it looks like this has been ordered due to atrial fibrillation.  IV heparin can be held on call to the OR tomorrow.   Dagoberto Ligas, PA-C [facility] [phone] 02/15/2021 9:55 AM  VASCULAR STAFF ADDENDUM: I have independently interviewed and examined the patient. I agree with the above.  Patient was significant tissue loss in the left foot.  We had a long discussion regarding TMA versus below-knee amputation.  I am afraid that transmetatarsal amputation has a high failure rate, especially with his longstanding diabetes and end-stage renal disease.  He asked to have the most definitive amputation out of the 2 options, and therefore we will pursue below-knee amputation tomorrow.  Cassandria Santee, MD  [facility] Phone Number: [phone] 02/15/2021 7:16 PM

## 2021-02-15 NOTE — Progress Notes (Signed)
ABI has been completed.   Preliminary results in CV Proc.   Brendan Campbell 02/15/2021 3:26 PM

## 2021-02-15 NOTE — Progress Notes (Signed)
Pharmacy Antibiotic Note  Brendan Campbell is a 51 y.o. male admitted on 02/12/2021 with bacteremia.  Pharmacy has been consulted for Vancomycin dosing.  ESRD on HD MWF, 1 of 2 blood cx + MRSA identified on 02/04/21 and ID suggested 6 weeiks of empiric tx .  PreHD vanc level came back in goal at 22 today. Plan to re-consult ID to see if there is any further recommendation. Original plan was for 6wks of vanc (through 2/6)  Plan: Continue Vancomycin 1gm IV MWF Monitor V/S, labs and levels as indicated.  Height: 5\' 9"  (175.3 cm) Weight: 91.2 kg (201 lb 1 oz) IBW/kg (Calculated) : 70.7  Temp (24hrs), Avg:98.2 F (36.8 C), Min:98.1 F (36.7 C), Max:98.3 F (36.8 C)  Recent Labs  Lab 02/12/21 1005 02/12/21 1006 02/12/21 1234 02/13/21 0304 02/13/21 1424 02/14/21 0821 02/15/21 0558  WBC 8.3  --   --  9.6  --  11.1* 12.8*  CREATININE 8.89*  --   --  5.77* 6.23* 6.58* 7.43*  LATICACIDVEN  --  2.3* 2.7*  --   --   --   --   VANCORANDOM  --   --   --   --   --   --  22     Estimated Creatinine Clearance: 13.4 mL/min (A) (by C-G formula based on SCr of 7.43 mg/dL (H)).    Allergies  Allergen Reactions   Tape Other (See Comments)    Pulls skin off   Chlorhexidine Gluconate [Chlorhexidine] Rash    Antimicrobials this admission: Vancomycin 1/6 >> PTA  Microbiology results: 1/6 BCx: ngtd  MRSA PCR:   Onnie Boer, PharmD, BCIDP, AAHIVP, CPP Infectious Disease Pharmacist 02/15/2021 8:49 AM

## 2021-02-15 NOTE — Consult Note (Signed)
[name] for Infectious Disease    Date of Admission:  02/12/2021   Total days of inpatient antibiotics 1        Reason for Consult: Left foot wound    Principal Problem:   Hyperkalemia Active Problems:   Type 2 diabetes mellitus with diabetic chronic kidney disease (HCC)   Hyperlipidemia   S/P below knee amputation, right (HCC)   ESRD on hemodialysis (HCC)   Peripheral vascular disease (HCC)   Hypocalcemia   Chronic atrial fibrillation (HCC)   Acute on chronic combined systolic and diastolic CHF (congestive heart failure) (HCC)   Pain in limb   Left hemiparesis (HCC)   Volume overload   MRSA bacteremia   Rash   Assessment: 51 YM with CHF, ESRD on iHD MWF, DM (A1c 5.5 on 02/05/21), recent MRSA bacteremia 2/2 LLE cellulitis  on 6 weeks of vancomycin in the setting of AVF admitted for hyperkalemia an worsening LLE wound.    #LLE dry gangrenous wound #RLE cellulitis #Hx of MRSA bacteremia 2/2 LLE cellulitis #PVD -Blood Cx from 1/6 remain negative -TTE on 02/06/21 showed no evidence of vegetation -CT angio b/l did not identity left sided DP and 1.3cm soft tissue ulcer on medial aspect of left foot. Vascular surgery engaged and recommend ABIs, likely need amputation.  -Although pt missed a couple doses of vancomycin in the interim, Cx remain negative.  Would add ceftriaxone to broaden coverage for  cellulitis.    Recommendations:  -Continue Vancomycin -Start Ceftriaxone -Follow blood Cx -MRI femur right to evaluate stump site -MRI LLE -Follow-up ABI -Engage Ortho  Microbiology:   Antibiotics: Vancomycin 12/30-p(missed 1/2 and 1/4)  Cultures: Blood 1/6 NGTD 12/31 NGTD   HPI: Brendan Campbell is a 51 y.o. male  with  sCHF, ESRD on iHD, DMII, HLD, PVD neuropathy, PTSD, recent admission for MRSA bacteremia 2/2 LLE cellulitis(12/29-31) discharged on vancomycin x 6 weeks admitted for hyperkalemia. LLE wound appeared gangrenous. CT angio b/l LE showed no  DP. Vancomycin was continued. Pt had missed a couple doses onf vancomycin prior to admission. Nephrology consulted for urgent iHD. Vascular surgery consulted and recommend ABIs for further evaluation Today, pt is resting in bed during iHD. He reports he only missed one dose of vancomycin (1/4). Denies any recent trauma to lower limbs. Denies fever, chills.   Review of Systems: Review of Systems  All other systems reviewed and are negative.  Past Medical History:  Diagnosis Date   Anemia    Anxiety    Blood transfusion without reported diagnosis    CHF (congestive heart failure) ([place])    a. EF 25% by echo in 07/2019   Chronic kidney disease    Depression    Diabetes mellitus without complication (HCC)    End stage renal disease ([place])    M/W/F [facility]   Hyperlipidemia    Neuropathy    Peripheral vascular disease (HCC)    PTSD (post-traumatic stress disorder)     Social History   Tobacco Use   Smoking status: Some Days    Types: Cigarettes    Last attempt to quit: 09/03/2006    Years since quitting: 14.4   Smokeless tobacco: Never  Vaping Use   Vaping Use: Never used  Substance Use Topics   Alcohol use: No   Drug use: No    Family History  Problem Relation Age of Onset   Cancer Mother        lung  Diabetes Mother    Heart attack Father    Diabetes Father    Diabetes Sister    Scheduled Meds:  aspirin  81 mg Oral Daily   atorvastatin  10 mg Oral Q M,W,F-1800   calcium acetate  667 mg Oral TID WC   calcium carbonate  800 mg of elemental calcium Oral BID   carvedilol  3.125 mg Oral BID WC   cholecalciferol  1,000 Units Oral Q M,W,F   darbepoetin (ARANESP) injection - DIALYSIS  40 mcg Intravenous Q Mon-HD   pantoprazole  20 mg Oral Daily   sodium chloride flush  3 mL Intravenous Q12H   sodium chloride flush  3 mL Intravenous Q12H   Continuous Infusions:  sodium chloride     sodium chloride     sodium chloride     cefTRIAXone (ROCEPHIN)  IV     heparin  1,700 Units/hr (02/15/21 0919)   vancomycin 1,000 mg (02/15/21 1105)   PRN Meds:.sodium chloride, sodium chloride, sodium chloride, acetaminophen **OR** acetaminophen, bisacodyl, butalbital-acetaminophen-caffeine, calamine, heparin, hydrALAZINE, HYDROmorphone (DILAUDID) injection, ipratropium, lidocaine (PF), lidocaine-prilocaine, oxyCODONE, pentafluoroprop-tetrafluoroeth, povidone-iodine, senna-docusate, sodium chloride flush, zolpidem Allergies  Allergen Reactions   Tape Other (See Comments)    Pulls skin off   Chlorhexidine Gluconate [Chlorhexidine] Rash    OBJECTIVE: Blood pressure (!) 100/38, pulse 86, temperature (!) 97.5 F (36.4 C), temperature source Temporal, resp. rate 18, height 5\' 9"  (1.753 m), weight 105 kg, SpO2 100 %.  Physical Exam Constitutional:      General: He is not in acute distress.    Appearance: He is normal weight. He is not toxic-appearing.  HENT:     Head: Normocephalic and atraumatic.     Right Ear: External ear normal.     Left Ear: External ear normal.     Nose: No congestion or rhinorrhea.     Mouth/Throat:     Mouth: Mucous membranes are moist.     Pharynx: Oropharynx is clear.  Eyes:     Extraocular Movements: Extraocular movements intact.     Conjunctiva/sclera: Conjunctivae normal.     Pupils: Pupils are equal, round, and reactive to light.  Cardiovascular:     Rate and Rhythm: Normal rate and regular rhythm.     Heart sounds: No murmur heard.   No friction rub. No gallop.  Pulmonary:     Effort: Pulmonary effort is normal.     Breath sounds: Normal breath sounds.  Abdominal:     General: Abdomen is flat. Bowel sounds are normal.     Palpations: Abdomen is soft.  Musculoskeletal:        General: No swelling.     Cervical back: Normal range of motion and neck supple.  Skin:    General: Skin is warm and dry.     Comments: RBKA site with eschar and surrouding erythema, tender to deep papation L foot bandaged.   Neurological:      General: No focal deficit present.     Mental Status: He is oriented to person, place, and time.  Psychiatric:        Mood and Affect: Mood normal.    Lab Results Lab Results  Component Value Date   WBC 12.6 (H) 02/15/2021   HGB 9.1 (L) 02/15/2021   HCT 28.7 (L) 02/15/2021   MCV 105.9 (H) 02/15/2021   PLT 230 02/15/2021    Lab Results  Component Value Date   CREATININE 7.41 (H) 02/15/2021   BUN 49 (H) 02/15/2021  NA 132 (L) 02/15/2021   K 5.3 (H) 02/15/2021   CL 89 (L) 02/15/2021   CO2 25 02/15/2021    Lab Results  Component Value Date   ALT 12 02/12/2021   AST 17 02/12/2021   ALKPHOS 83 02/12/2021   BILITOT 1.0 02/12/2021       Laurice Record, [facility] 02/15/2021, 12:12 PM

## 2021-02-15 NOTE — Progress Notes (Signed)
ANTICOAGULATION CONSULT NOTE - Follow Up Consult  Pharmacy Consult for Heparin Indication: Afib  Allergies  Allergen Reactions   Tape Other (See Comments)    Pulls skin off   Chlorhexidine Gluconate [Chlorhexidine] Rash    Patient Measurements: Height: 5\' 9"  (175.3 cm) Weight: 91.2 kg (201 lb 1 oz) IBW/kg (Calculated) : 70.7 Heparin Dosing Weight:  93.4 kg   Vital Signs: BP: 107/80 (01/09 0443) Pulse Rate: 89 (01/09 0443)  Labs: Recent Labs    02/12/21 1005 02/12/21 1234 02/13/21 0304 02/13/21 0308 02/13/21 1058 02/13/21 1424 02/13/21 2200 02/14/21 0821 02/15/21 0558  HGB 11.3*  --  9.7*  --   --   --   --  9.9* 9.5*  HCT 35.2*  --  29.2*  --   --   --   --  29.9* 28.7*  PLT 233  --  198  --   --   --   --  231 233  APTT  --    < > 69*  --  157*  --  91*  --   --   LABPROT 16.9*  --  17.5*  --   --   --   --   --   --   INR 1.4*  --  1.4*  --   --   --   --   --   --   HEPARINUNFRC <0.10*  --   --    < > 0.41  --  0.21* 0.42 0.24*  CREATININE 8.89*  --  5.77*  --   --  6.23*  --  6.58* 7.43*   < > = values in this interval not displayed.     Estimated Creatinine Clearance: 13.4 mL/min (A) (by C-G formula based on SCr of 7.43 mg/dL (H)).   Assessment: Eliquis PTA, last dose 02/10/21 at 1700 for afib. Switched to dosing heparin per pharmacy.   Heparin came back subtherapeutic this AM. Hb 9.5. We will adjust dose and recheck level.    Goal of Therapy:  Heparin level 0.3-0.7 units/ml Monitor platelets by anticoagulation protocol: Yes   Plan:  Increase heparin drip to 1700 units/hr  8h HL  Daily HL, and CBC  Onnie Boer, PharmD, BCIDP, AAHIVP, CPP Infectious Disease Pharmacist 02/15/2021 8:33 AM

## 2021-02-15 NOTE — Progress Notes (Signed)
Right arm assessed for IV access Infiltrated area present for previous RAC vein with bruising from [facility] to inner upper arm. Unable to find appropriate vein for IV site. Attempt for [place] IV in right cephalic vein by Danny T unsuccessful. RN notified.

## 2021-02-16 ENCOUNTER — Inpatient Hospital Stay (HOSPITAL_COMMUNITY): Payer: Medicaid Other | Admitting: Certified Registered Nurse Anesthetist

## 2021-02-16 ENCOUNTER — Inpatient Hospital Stay (HOSPITAL_COMMUNITY): Payer: Medicaid Other

## 2021-02-16 ENCOUNTER — Encounter (HOSPITAL_COMMUNITY): Payer: Self-pay | Admitting: Family Medicine

## 2021-02-16 ENCOUNTER — Encounter (HOSPITAL_COMMUNITY)
Admission: EM | Disposition: A | Discharge status: Home Health | Payer: Self-pay | Source: Home / Self Care | Attending: Internal Medicine

## 2021-02-16 DIAGNOSIS — E875 Hyperkalemia: Secondary | ICD-10-CM | POA: Diagnosis not present

## 2021-02-16 HISTORY — PX: AMPUTATION: SHX166

## 2021-02-16 LAB — CBC WITH DIFFERENTIAL/PLATELET
Abs Immature Granulocytes: 0.04 10*3/uL (ref 0.00–0.07)
Basophils Absolute: 0.1 10*3/uL (ref 0.0–0.1)
Basophils Relative: 1 %
Eosinophils Absolute: 1 10*3/uL — ABNORMAL HIGH (ref 0.0–0.5)
Eosinophils Relative: 10 %
HCT: 29 % — ABNORMAL LOW (ref 39.0–52.0)
Hemoglobin: 9.3 g/dL — ABNORMAL LOW (ref 13.0–17.0)
Immature Granulocytes: 0 %
Lymphocytes Relative: 8 %
Lymphs Abs: 0.9 10*3/uL (ref 0.7–4.0)
MCH: 33.8 pg (ref 26.0–34.0)
MCHC: 32.1 g/dL (ref 30.0–36.0)
MCV: 105.5 fL — ABNORMAL HIGH (ref 80.0–100.0)
Monocytes Absolute: 0.6 10*3/uL (ref 0.1–1.0)
Monocytes Relative: 6 %
Neutro Abs: 7.8 10*3/uL — ABNORMAL HIGH (ref 1.7–7.7)
Neutrophils Relative %: 75 %
Platelets: 204 10*3/uL (ref 150–400)
RBC: 2.75 MIL/uL — ABNORMAL LOW (ref 4.22–5.81)
RDW: 18 % — ABNORMAL HIGH (ref 11.5–15.5)
WBC: 10.4 10*3/uL (ref 4.0–10.5)
nRBC: 0.4 % — ABNORMAL HIGH (ref 0.0–0.2)

## 2021-02-16 LAB — POCT I-STAT, CHEM 8
BUN: 30 mg/dL — ABNORMAL HIGH (ref 6–20)
Calcium, Ion: 1.11 mmol/L — ABNORMAL LOW (ref 1.15–1.40)
Chloride: 95 mmol/L — ABNORMAL LOW (ref 98–111)
Creatinine, Ser: 5.3 mg/dL — ABNORMAL HIGH (ref 0.61–1.24)
Glucose, Bld: 87 mg/dL (ref 70–99)
HCT: 37 % — ABNORMAL LOW (ref 39.0–52.0)
Hemoglobin: 12.6 g/dL — ABNORMAL LOW (ref 13.0–17.0)
Potassium: 4.3 mmol/L (ref 3.5–5.1)
Sodium: 133 mmol/L — ABNORMAL LOW (ref 135–145)
TCO2: 28 mmol/L (ref 22–32)

## 2021-02-16 LAB — BASIC METABOLIC PANEL
Anion gap: 12 (ref 5–15)
BUN: 24 mg/dL — ABNORMAL HIGH (ref 6–20)
CO2: 26 mmol/L (ref 22–32)
Calcium: 8.5 mg/dL — ABNORMAL LOW (ref 8.9–10.3)
Chloride: 95 mmol/L — ABNORMAL LOW (ref 98–111)
Creatinine, Ser: 4.61 mg/dL — ABNORMAL HIGH (ref 0.61–1.24)
GFR, Estimated: 15 mL/min — ABNORMAL LOW (ref >60)
Glucose, Bld: 86 mg/dL (ref 70–99)
Potassium: 4.5 mmol/L (ref 3.5–5.1)
Sodium: 133 mmol/L — ABNORMAL LOW (ref 135–145)

## 2021-02-16 LAB — PHOSPHORUS: Phosphorus: 3.9 mg/dL (ref 2.5–4.6)

## 2021-02-16 LAB — GLUCOSE, CAPILLARY
Glucose-Capillary: 104 mg/dL — ABNORMAL HIGH (ref 70–99)
Glucose-Capillary: 91 mg/dL (ref 70–99)

## 2021-02-16 LAB — SURGICAL PCR SCREEN
MRSA, PCR: POSITIVE — AB
Staphylococcus aureus: POSITIVE — AB

## 2021-02-16 LAB — MAGNESIUM: Magnesium: 1.9 mg/dL (ref 1.7–2.4)

## 2021-02-16 IMAGING — MR MR FEMUR*R* W/O CM
4 of 5 series · 11 of 40 positions shown · non-contrast
Comparison: None.

CLINICAL DATA: Bacteremia.

EXAM:
MRI OF THE RIGHT FEMUR WITHOUT CONTRAST
TECHNIQUE: Multiplanar, multisequence MR imaging of the right femur was
performed. No intravenous contrast was administered.

[Series 3: STIR · coronal · 4.0mm · 1.17mm/px · 2 of 46 slices shown]
[im 1/46]
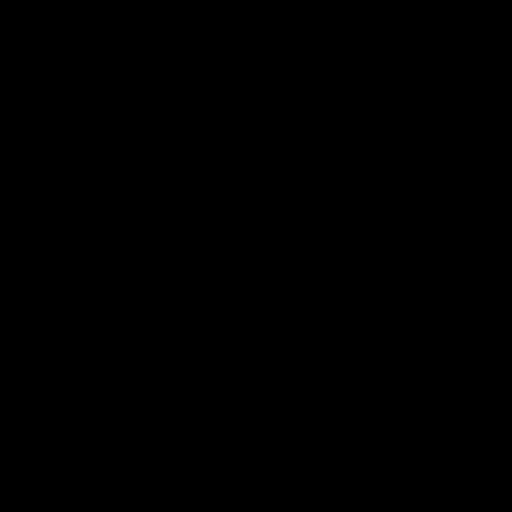
[im 23/46]
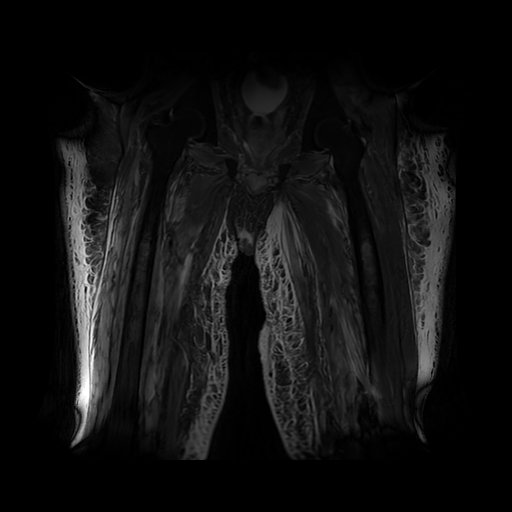

[Series 4: T1 · coronal · 4.0mm · 0.59mm/px · 3 of 46 slices shown (1 of 2)]
[im 1/46]
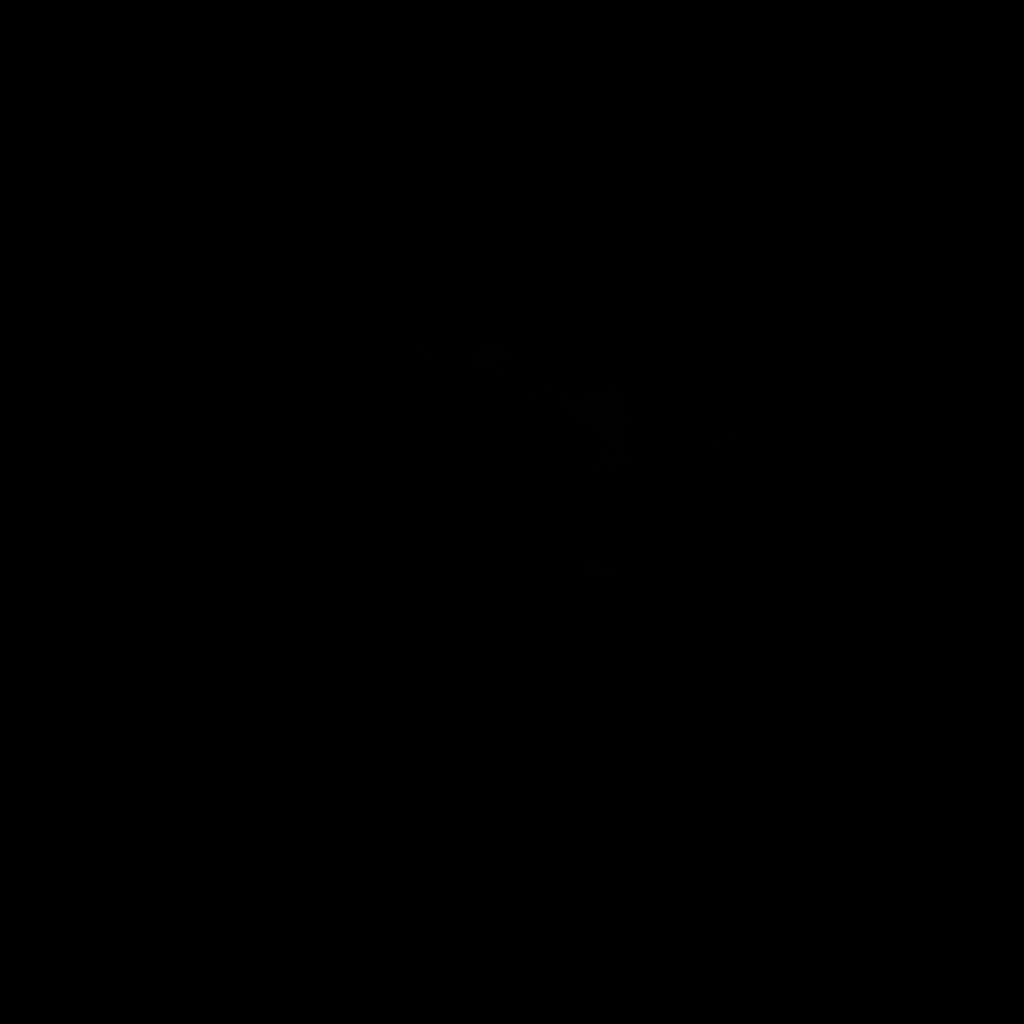
[im 23/46]
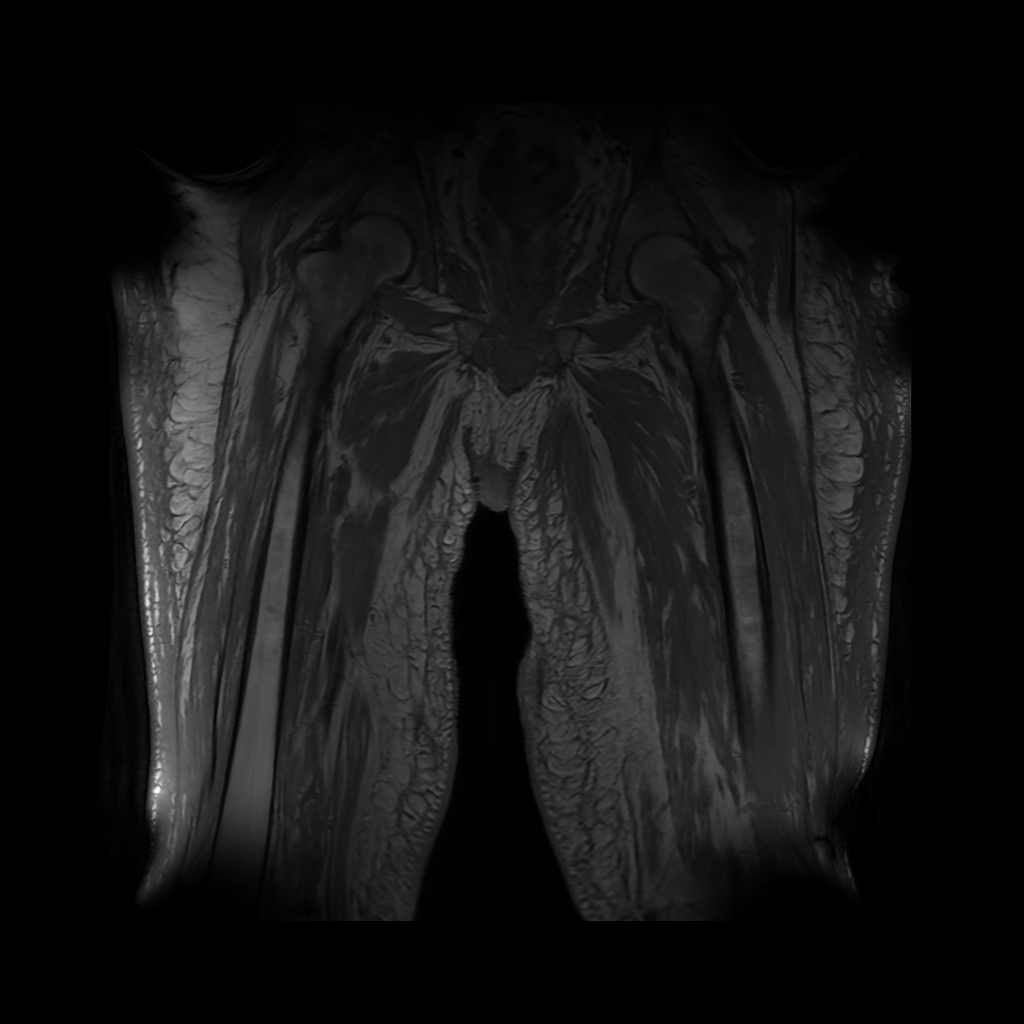
[im 46/46]
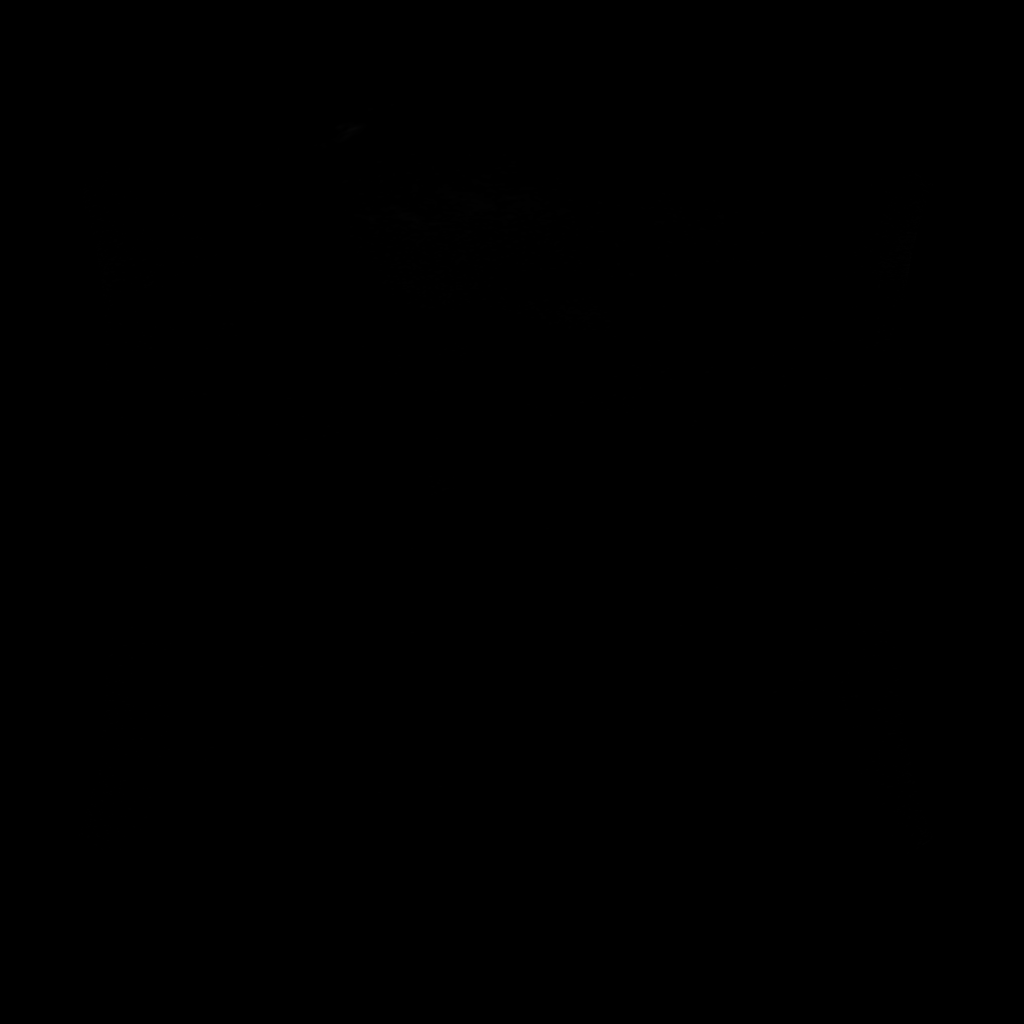

[Series 5: T1 · axial · 4.0mm · 0.47mm/px · z∈[-232,+111]mm · 3 of 96 slices shown (2 of 2)]
[im 18/96]
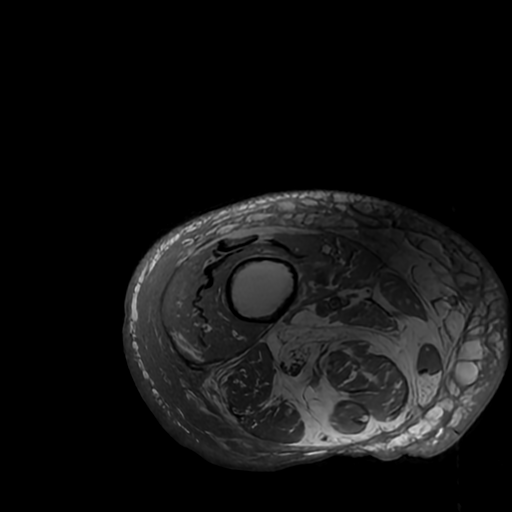
[im 52/96]
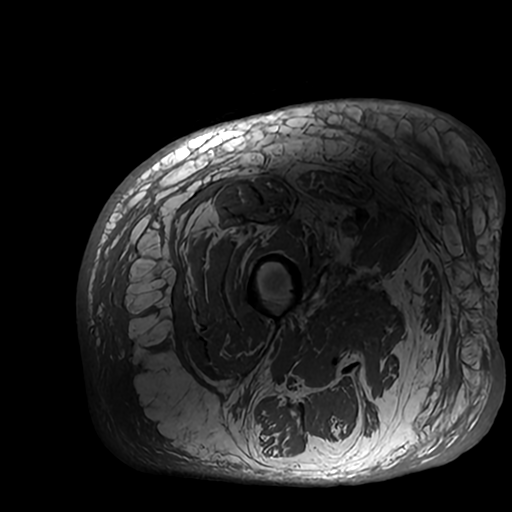
[im 87/96]
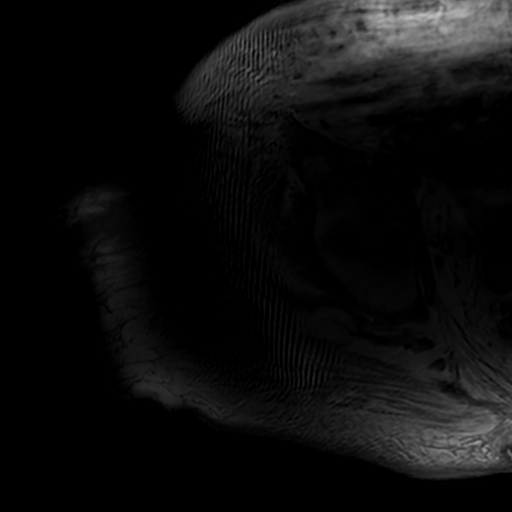

[Series 6: T2 fat-sat · axial · 4.0mm · 0.47mm/px · z∈[-232,+111]mm · 3 of 96 slices shown]
[im 18/96]
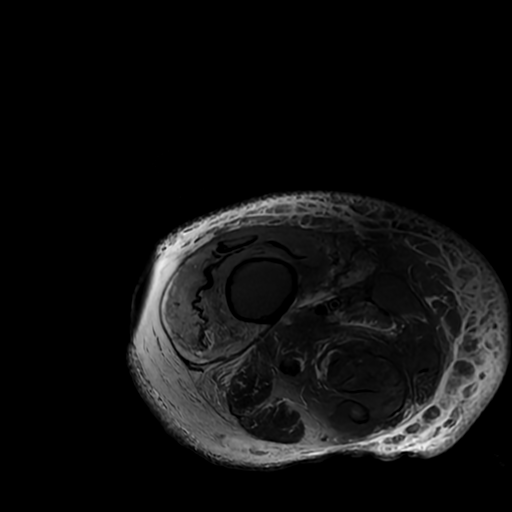
[im 52/96]
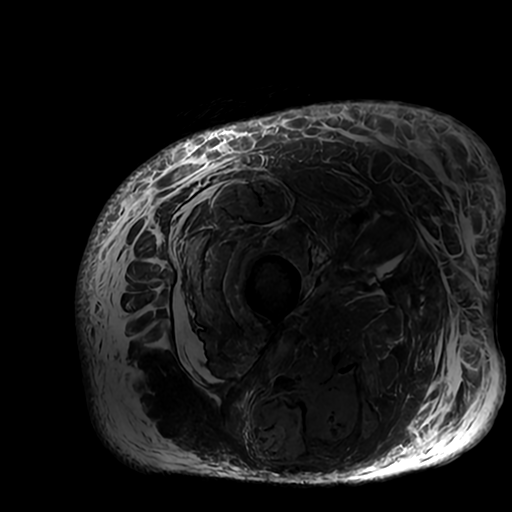
[im 87/96]
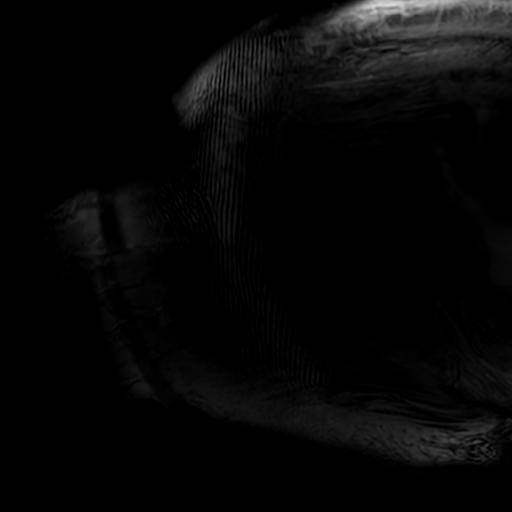

[11 of 40 positions shown; findings below may reference images not displayed]

FINDINGS: Bones/Joint/Cartilage

Marrow signal is within normal limits. No evidence of osteomyelitis.
No appreciable hip joint effusion or evidence of septic arthritis.

Ligaments

Intact

Muscles and Tendons

Marked muscle edema prominent about rectus lateral talus with trace
amount of fluid superficial to the vastus lateralis. There is also
mild edema of the hamstring muscles. No intramuscular fluid
collection or abscess.

Soft tissues

Marked skin thickening and subcutaneous soft tissue edema of
bilateral lower extremities consistent with cellulitis. No drainable
fluid collection or abscess.
IMPRESSION: 1.  No evidence of osteomyelitis or septic arthritis.

2. Generalized edema prominent in the right vastus lateralis and
about the hamstring muscles concerning for myositis. No drainable
fluid collection or abscess.

3. Marked skin thickening and subcutaneous soft tissue edema
consistent with diffuse cellulitis. No drainable fluid collection or
abscess.

## 2021-02-16 SURGERY — AMPUTATION BELOW KNEE
Anesthesia: General | Site: Foot | Laterality: Left

## 2021-02-16 MED ORDER — OXYCODONE HCL 5 MG PO TABS: 5.0000 mg | ORAL_TABLET | Freq: Once | ORAL | Status: DC | PRN

## 2021-02-16 MED ORDER — MIDAZOLAM HCL 2 MG/2ML IJ SOLN
INTRAMUSCULAR | Status: AC
  Filled 2021-02-16: qty 2

## 2021-02-16 MED ORDER — PROPOFOL 10 MG/ML IV BOLUS
INTRAVENOUS | Status: DC | PRN
  Administered 2021-02-16: 60 mg via INTRAVENOUS

## 2021-02-16 MED ORDER — DEXAMETHASONE SODIUM PHOSPHATE 10 MG/ML IJ SOLN
INTRAMUSCULAR | Status: DC | PRN
  Administered 2021-02-16: 4 mg via INTRAVENOUS

## 2021-02-16 MED ORDER — MIDAZOLAM HCL 2 MG/2ML IJ SOLN
INTRAMUSCULAR | Status: DC | PRN
  Administered 2021-02-16: 1 mg via INTRAVENOUS

## 2021-02-16 MED ORDER — PHENYLEPHRINE HCL (PRESSORS) 10 MG/ML IV SOLN: INTRAVENOUS | Status: DC | PRN

## 2021-02-16 MED ORDER — LACTATED RINGERS IV SOLN: INTRAVENOUS | Status: DC

## 2021-02-16 MED ORDER — PHENYLEPHRINE HCL-NACL 20-0.9 MG/250ML-% IV SOLN
INTRAVENOUS | Status: DC | PRN
Start: 2021-02-16 — End: 2021-02-16
  Administered 2021-02-16: 20 ug/min via INTRAVENOUS

## 2021-02-16 MED ORDER — FENTANYL CITRATE (PF) 250 MCG/5ML IJ SOLN
INTRAMUSCULAR | Status: AC
  Filled 2021-02-16: qty 5

## 2021-02-16 MED ORDER — MUPIROCIN 2 % EX OINT
1.0000 "application " | TOPICAL_OINTMENT | Freq: Two times a day (BID) | CUTANEOUS | Status: DC
  Administered 2021-02-16 – 2021-02-19 (×7): 1 via NASAL
  Filled 2021-02-16 (×2): qty 22

## 2021-02-16 MED ORDER — SODIUM CHLORIDE 0.9 % IV SOLN
INTRAVENOUS | Status: DC | PRN
Start: 2021-02-16 — End: 2021-02-16

## 2021-02-16 MED ORDER — LIDOCAINE 2% (20 MG/ML) 5 ML SYRINGE
INTRAMUSCULAR | Status: DC | PRN
Start: 2021-02-16 — End: 2021-02-16
  Administered 2021-02-16: 40 mg via INTRAVENOUS

## 2021-02-16 MED ORDER — AMISULPRIDE (ANTIEMETIC) 5 MG/2ML IV SOLN: 10.0000 mg | Freq: Once | INTRAVENOUS | Status: DC | PRN

## 2021-02-16 MED ORDER — ETOMIDATE 2 MG/ML IV SOLN
INTRAVENOUS | Status: DC | PRN
  Administered 2021-02-16: 10 mg via INTRAVENOUS

## 2021-02-16 MED ORDER — SODIUM CHLORIDE 0.9 % IV SOLN: INTRAVENOUS | Status: DC

## 2021-02-16 MED ORDER — ACETAMINOPHEN 325 MG PO TABS: 325.0000 mg | ORAL_TABLET | Freq: Once | ORAL | Status: DC | PRN

## 2021-02-16 MED ORDER — ACETAMINOPHEN 10 MG/ML IV SOLN: 1000.0000 mg | Freq: Once | INTRAVENOUS | Status: DC | PRN

## 2021-02-16 MED ORDER — MUPIROCIN 2 % EX OINT
TOPICAL_OINTMENT | CUTANEOUS | Status: AC
  Filled 2021-02-16: qty 22

## 2021-02-16 MED ORDER — MEPERIDINE HCL 25 MG/ML IJ SOLN: 6.2500 mg | INTRAMUSCULAR | Status: DC | PRN

## 2021-02-16 MED ORDER — PROMETHAZINE HCL 25 MG/ML IJ SOLN: 6.2500 mg | INTRAMUSCULAR | Status: DC | PRN

## 2021-02-16 MED ORDER — HYDROMORPHONE HCL 1 MG/ML IJ SOLN: 0.2500 mg | INTRAMUSCULAR | Status: DC | PRN

## 2021-02-16 MED ORDER — OXYCODONE HCL 5 MG/5ML PO SOLN: 5.0000 mg | Freq: Once | ORAL | Status: DC | PRN

## 2021-02-16 MED ORDER — 0.9 % SODIUM CHLORIDE (POUR BTL) OPTIME
TOPICAL | Status: DC | PRN
  Administered 2021-02-16: 1000 mL

## 2021-02-16 MED ORDER — ACETAMINOPHEN 160 MG/5ML PO SOLN: 325.0000 mg | Freq: Once | ORAL | Status: DC | PRN

## 2021-02-16 MED ORDER — PROPOFOL 10 MG/ML IV BOLUS
INTRAVENOUS | Status: AC
  Filled 2021-02-16: qty 20

## 2021-02-16 MED ORDER — FENTANYL CITRATE (PF) 250 MCG/5ML IJ SOLN
INTRAMUSCULAR | Status: DC | PRN
  Administered 2021-02-16: 50 ug via INTRAVENOUS
  Administered 2021-02-16 (×2): 25 ug via INTRAVENOUS

## 2021-02-16 MED ORDER — FENTANYL CITRATE (PF) 100 MCG/2ML IJ SOLN
INTRAMUSCULAR | Status: AC
  Filled 2021-02-16: qty 2

## 2021-02-16 MED ORDER — ONDANSETRON HCL 4 MG/2ML IJ SOLN
INTRAMUSCULAR | Status: DC | PRN
Start: 2021-02-16 — End: 2021-02-16
  Administered 2021-02-16: 4 mg via INTRAVENOUS

## 2021-02-16 SURGICAL SUPPLY — 59 items
BAG COUNTER SPONGE SURGICOUNT (BAG) ×2 IMPLANT
BAG SPNG CNTER NS LX DISP (BAG) ×1
BANDAGE ESMARK 6X9 LF (GAUZE/BANDAGES/DRESSINGS) IMPLANT
BLADE SAW SAG 73X25 THK (BLADE) ×1
BLADE SAW SGTL 73X25 THK (BLADE) ×1 IMPLANT
BNDG CMPR 9X6 STRL LF SNTH (GAUZE/BANDAGES/DRESSINGS)
BNDG CMPR MED 10X6 ELC LF (GAUZE/BANDAGES/DRESSINGS) ×1
BNDG COHESIVE 6X5 TAN STRL LF (GAUZE/BANDAGES/DRESSINGS) ×2 IMPLANT
BNDG ELASTIC 4X5.8 VLCR STR LF (GAUZE/BANDAGES/DRESSINGS) ×2 IMPLANT
BNDG ELASTIC 6X10 VLCR STRL LF (GAUZE/BANDAGES/DRESSINGS) ×1 IMPLANT
BNDG ELASTIC 6X5.8 VLCR STR LF (GAUZE/BANDAGES/DRESSINGS) ×2 IMPLANT
BNDG ESMARK 6X9 LF (GAUZE/BANDAGES/DRESSINGS)
BNDG GAUZE ELAST 4 BULKY (GAUZE/BANDAGES/DRESSINGS) ×4 IMPLANT
CANISTER SUCT 3000ML PPV (MISCELLANEOUS) ×2 IMPLANT
CLIP LIGATING EXTRA MED SLVR (CLIP) ×2 IMPLANT
CLIP LIGATING EXTRA SM BLUE (MISCELLANEOUS) ×2 IMPLANT
CLIP TI LARGE 6 (CLIP) ×1 IMPLANT
CLIP VESOCCLUDE MED 6/CT (CLIP) IMPLANT
COVER BACK TABLE 60X90IN (DRAPES) IMPLANT
COVER SURGICAL LIGHT HANDLE (MISCELLANEOUS) ×2 IMPLANT
DRAIN CHANNEL 19F RND (DRAIN) IMPLANT
DRAPE HALF SHEET 40X57 (DRAPES) ×2 IMPLANT
DRAPE INCISE 23X17 IOBAN STRL (DRAPES) ×1
DRAPE INCISE 23X17 STRL (DRAPES) ×1 IMPLANT
DRAPE INCISE IOBAN 23X17 STRL (DRAPES) ×1 IMPLANT
DRAPE INCISE IOBAN 66X45 STRL (DRAPES) ×2 IMPLANT
DRAPE ORTHO SPLIT 77X108 STRL (DRAPES) ×4
DRAPE SURG ORHT 6 SPLT 77X108 (DRAPES) ×2 IMPLANT
DRSG ADAPTIC 3X8 NADH LF (GAUZE/BANDAGES/DRESSINGS) ×2 IMPLANT
ELECT REM PT RETURN 9FT ADLT (ELECTROSURGICAL) ×2
ELECTRODE REM PT RTRN 9FT ADLT (ELECTROSURGICAL) ×1 IMPLANT
EVACUATOR SILICONE 100CC (DRAIN) IMPLANT
GAUZE SPONGE 4X4 12PLY STRL (GAUZE/BANDAGES/DRESSINGS) ×4 IMPLANT
GAUZE XEROFORM 5X9 LF (GAUZE/BANDAGES/DRESSINGS) ×1 IMPLANT
GLOVE SRG 8 PF TXTR STRL LF DI (GLOVE) ×2 IMPLANT
GLOVE SURG POLYISO LF SZ8 (GLOVE) IMPLANT
GLOVE SURG UNDER POLY LF SZ8 (GLOVE) ×4
GOWN STRL REUS W/ TWL LRG LVL3 (GOWN DISPOSABLE) ×2 IMPLANT
GOWN STRL REUS W/TWL 2XL LVL3 (GOWN DISPOSABLE) ×2 IMPLANT
GOWN STRL REUS W/TWL LRG LVL3 (GOWN DISPOSABLE) ×4
IMMOBILIZER KNEE 22 UNIV (SOFTGOODS) ×1 IMPLANT
KIT BASIN OR (CUSTOM PROCEDURE TRAY) ×2 IMPLANT
KIT TURNOVER KIT B (KITS) ×2 IMPLANT
NS IRRIG 1000ML POUR BTL (IV SOLUTION) ×2 IMPLANT
PACK GENERAL/GYN (CUSTOM PROCEDURE TRAY) ×2 IMPLANT
PAD ARMBOARD 7.5X6 YLW CONV (MISCELLANEOUS) ×4 IMPLANT
STAPLER VISISTAT 35W (STAPLE) ×2 IMPLANT
STOCKINETTE IMPERVIOUS LG (DRAPES) ×2 IMPLANT
SUT ETHILON 3 0 PS 1 (SUTURE) IMPLANT
SUT SILK 2 0 (SUTURE) ×2
SUT SILK 2 0 SH CR/8 (SUTURE) ×3 IMPLANT
SUT SILK 2-0 18XBRD TIE 12 (SUTURE) ×1 IMPLANT
SUT SILK 3 0 (SUTURE) ×2
SUT SILK 3-0 18XBRD TIE 12 (SUTURE) ×1 IMPLANT
SUT VIC AB 2-0 CT1 18 (SUTURE) ×6 IMPLANT
SUT VIC AB 3-0 SH 18 (SUTURE) IMPLANT
TOWEL GREEN STERILE (TOWEL DISPOSABLE) ×4 IMPLANT
UNDERPAD 30X36 HEAVY ABSORB (UNDERPADS AND DIAPERS) ×2 IMPLANT
WATER STERILE IRR 1000ML POUR (IV SOLUTION) ×2 IMPLANT

## 2021-02-16 NOTE — Progress Notes (Signed)
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 Virl Cagey. HD tomorrow on schedule.  Asking for pain meds.  Objective Vitals:   02/16/21 0439 02/16/21 0946 02/16/21 1321 02/16/21 1336  BP: 97/80 (!) 120/56 111/68 (!) 114/47  Pulse: 85 100 77 90  Resp: 17 18 17 15   Temp: 97.6 F (36.4 C) 98.1 F (36.7 C) 98.1 F (36.7 C)   TempSrc:  Oral    SpO2: 94% 95% 95%   Weight:      Height:       Physical Exam General:chronically ill appearing male in NAD Heart:RRR, no mrg Lungs:CTAB, nml WOB on RA Abdomen:obese, NTND Extremities:R BKA-edema upper portion of thighs, hips and sacral area.  L BKA today ACE wrap intact. .  Skin - pale, diffuse excoriations in various stages of healing Dialysis Access: LU AVF +b/t   Additional Objective Labs: Basic Metabolic Panel: Recent Labs  Lab 02/13/21 1209 02/13/21 1424 02/15/21 0558 02/15/21 0923 02/15/21 2231 02/16/21 0958  NA  --    < > 134* 132* 133* 133*  K  --    < > 5.3* 5.3* 4.5 4.3  CL  --    < > 93* 89* 95* 95*  CO2  --    < > 25 25 26   --   GLUCOSE  --    < > 82 90 86 87  BUN  --    < > 47* 49* 24* 30*  CREATININE  --    < > 7.43* 7.41* 4.61* 5.30*  CALCIUM  --    < > 9.7 10.0 8.5*  --   PHOS 5.8*  --   --  5.6* 3.9  --    < > = values in this interval not displayed.   Liver Function Tests: Recent Labs  Lab 02/12/21 1005 02/15/21 0923  AST 17  --   ALT 12  --   ALKPHOS 83  --   BILITOT 1.0  --   PROT 7.1  --   ALBUMIN 3.3* 2.7*   No results for input(s): LIPASE, AMYLASE in the last 168 hours. CBC: Recent Labs  Lab 02/13/21 0304 02/14/21 7782 02/15/21 0558 02/15/21 0924 02/15/21 2231 02/16/21 0958  WBC 9.6 11.1* 12.8* 12.6* 10.4  --   NEUTROABS  --  8.4* 9.9*  --  7.8*  --   HGB 9.7* 9.9* 9.5* 9.1* 9.3* 12.6*  HCT 29.2* 29.9* 28.7* 28.7* 29.0* 37.0*  MCV 103.5* 106.0* 104.7* 105.9* 105.5*  --   PLT 198 231 233 230 204  --    Blood Culture    Component Value  Date/Time   SDES BLOOD RIGHT FOREARM 02/12/2021 1055   SPECREQUEST  02/12/2021 1055    BOTTLES DRAWN AEROBIC AND ANAEROBIC Blood Culture adequate volume   CULT  02/12/2021 1055    NO GROWTH 4 DAYS Performed at [facility], [address] [place], [place] [address]    REPTSTATUS PENDING 02/12/2021 1055    Cardiac Enzymes: No results for input(s): CKTOTAL, CKMB, CKMBINDEX, TROPONINI in the last 168 hours. CBG: Recent Labs  Lab 02/12/21 2214 02/16/21 0732 02/16/21 1321  GLUCAP 101* 104* 91   Iron Studies:  Recent Labs    02/15/21 0558  IRON 41*  TIBC 192*  FERRITIN 860*   @lablastinr3 @ Studies/Results: VAS [place] ABI WITH/WO TBI  Result Date: 02/15/2021  LOWER EXTREMITY DOPPLER STUDY Patient Name:  BENTLEIGH STANKUS  Date of Exam:   02/15/2021 Medical  Rec #: [id number]       Accession #:    [id number] Date of Birth: 06/27/1970        Patient Gender: M Patient Age:   51 years Exam Location:  [facility] Procedure:      VAS [place] ABI WITH/WO TBI Referring Phys: JOSHUA ROBINS --------------------------------------------------------------------------------  Indications: Left Ischemia, critical limb. High Risk Factors: Hyperlipidemia, Diabetes.  Vascular Interventions: Rt bka done. Comparison Study: no prior Performing Technologist: Archie Patten RVS  Examination Guidelines: A complete evaluation includes at minimum, Doppler waveform signals and systolic blood pressure reading at the level of bilateral brachial, anterior tibial, and posterior tibial arteries, when vessel segments are accessible. Bilateral testing is considered an integral part of a complete examination. Photoelectric Plethysmograph (PPG) waveforms and toe systolic pressure readings are included as required and additional duplex testing as needed. Limited examinations for reoccurring indications may be performed as noted.  ABI Findings: +--------+------------------+-----+---------+--------+  Right    Rt Pressure  (mmHg) Index Waveform  Comment   +--------+------------------+-----+---------+--------+  Brachial 123                      triphasic           +--------+------------------+-----+---------+--------+  PTA                                         bka       +--------+------------------+-----+---------+--------+  DP                                          bka       +--------+------------------+-----+---------+--------+ +---------+------------------+-----+--------+----------------------------+  Left      Lt Pressure (mmHg) Index Waveform Comment                       +---------+------------------+-----+--------+----------------------------+  Brachial                                    fistula                       +---------+------------------+-----+--------+----------------------------+  PTA       255                2.07  biphasic                               +---------+------------------+-----+--------+----------------------------+  DP        255                2.07  biphasic                               +---------+------------------+-----+--------+----------------------------+  Great Toe                                   great toe wound and bandaged  +---------+------------------+-----+--------+----------------------------+ +-------+-----------+-----------+------------+------------+  ABI/TBI Today's ABI Today's TBI Previous ABI Previous TBI  +-------+-----------+-----------+------------+------------+  Right   bka                                                +-------+-----------+-----------+------------+------------+  Left    2.07                                               +-------+-----------+-----------+------------+------------+   Summary: Right: Bka. 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  *See table(s) above for measurements and observations.  Electronically signed by Servando Snare MD on 02/15/2021 at 5:54:07 PM.    Final    Medications:  sodium  chloride     cefTRIAXone (ROCEPHIN)  IV     vancomycin 1,000 mg (02/15/21 1105)    aspirin  81 mg Oral Daily   atorvastatin  10 mg Oral Q M,W,F-1800   calcium acetate  667 mg Oral TID WC   calcium carbonate  800 mg of elemental calcium Oral BID   carvedilol  3.125 mg Oral BID WC   cholecalciferol  1,000 Units Oral Q M,W,F   darbepoetin (ARANESP) injection - DIALYSIS  40 mcg Intravenous Q Mon-HD   mupirocin ointment  1 application Nasal BID   mupirocin ointment       pantoprazole  20 mg Oral Daily   sodium chloride flush  3 mL Intravenous Q12H   sodium chloride flush  3 mL Intravenous Q12H     OP HD: [facility] MWF  4h 76min  95 kg   1K/2.25 bath  Hep 1000+ 500u/hr  LUE AVG  300/500   - prosthetic weighs 1.6 kg  - Epogen 1000   Units IV/HD     Assessment/Plan:  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 [name]  MRSA bacteremia - missed 1 week of IV vancomycin. Resume IV vanc. ID consulted. Added ceftriaxone. Per primary  ESRD -  Next HD today, in progress.   Hypertension/volume  - Blood pressure at goal. . Max UF next HD on Wed. Still very much above OP EDW. Max UF goal as tolerated with HD tomorrow.   Anemia  - Hgb 9.3, on ESA. Given Aranesp 40 mcg IV 02/15/2021.   Metabolic bone disease -   Calcium in goal. PO4 at goal  Continue home meds.  Not on VDRA.   Nutrition - renal diet, carb modified with fluid restrictions.     Lysa Livengood H. Fedora Knisely NP-C 02/16/2021, 2:15 PM  [employer] [phone]

## 2021-02-16 NOTE — Anesthesia Procedure Notes (Signed)
Procedure Name: LMA Insertion Date/Time: 02/16/2021 11:33 AM Performed by: Clearnce Sorrel, CRNA Pre-anesthesia Checklist: Patient identified, Emergency Drugs available, Suction available and Patient being monitored Patient Re-evaluated:Patient Re-evaluated prior to induction Oxygen Delivery Method: Circle System Utilized Preoxygenation: Pre-oxygenation with 100% oxygen Induction Type: IV induction Ventilation: Mask ventilation without difficulty LMA: LMA inserted LMA Size: 5.0 Number of attempts: 1 Placement Confirmation: positive ETCO2 Tube secured with: Tape Dental Injury: Teeth and Oropharynx as per pre-operative assessment

## 2021-02-16 NOTE — Transfer of Care (Signed)
Immediate Anesthesia Transfer of Care Note  Patient: Brendan Campbell  Procedure(s) Performed: AMPUTATION BELOW KNEE (Left: Foot)  Patient Location: PACU  Anesthesia Type:General  Level of Consciousness: drowsy and patient cooperative  Airway & Oxygen Therapy: Patient Spontanous Breathing and Patient connected to face mask oxygen  Post-op Assessment: Report given to RN and Post -op Vital signs reviewed and stable  Post vital signs: Reviewed and stable  Last Vitals:  Vitals Value Taken Time  BP    Temp    Pulse    Resp    SpO2      Last Pain:  Vitals:   02/16/21 1006  TempSrc:   PainSc: 0-No pain      Patients Stated Pain Goal: 0 (28/83/37 4451)  Complications: No notable events documented.

## 2021-02-16 NOTE — Op Note (Signed)
° ° °  NAME: Brendan Campbell    MRN: [id number] DOB: 1970-09-22    DATE OF OPERATION: 02/16/2021  PREOP DIAGNOSIS:    Rutherford 6 critical limb ischemia in the left leg  POSTOP DIAGNOSIS:    Same  PROCEDURE:    Below-knee amputation  SURGEON: Broadus John  ASSIST: Roxy Horseman  ANESTHESIA: General  EBL: 250 mL  INDICATIONS:    [name] is a 51 y.o. male with nonhealing left first toe ray amputation.  Wound bed has dry gangrene.  There is exposed bone. Deklin's peripheral vascular disease is defined as Rutherford 5/6 critical limb ischemia. I had a long discussion with Azar regarding the above.  He has a nonpalpable pulse in the foot and therefore an ABI was ordered.  ABI demonstrated severely calcified vessels with falsely elevated pressures.  I had a long discussion with Tabitha regarding left lower extremity revascularization with possible transmetatarsal amputation versus below-knee amputation.  Savvas asked for the most definitive amputation.  After considering his options, he chose to pursue left below-knee amputation.  FINDINGS:   Significant soft tissue edema Knee contracture present Healthy, viable muscle below the knee.  TECHNIQUE:   After informed consent was obtained, the patient was brought to the OR laid in the supine position.  general anesthesia was induced, and the patient's left leg was prepped and draped in standard fashion.  Incisions were marked for a below the knee amputation about 10 cm below the tibial tuberosity. The skin, subcutaneous tissue, muscle and fascia were divided with cautery.  A tourniquet was not used for this case, as the patient had known circumferential calcification, and I did not think the tourniquet would work.  Hemostats were applied to vascular structures. As I worked through muscle with cautery. Notably the tissues were all viable. The tibia and fibula were dissected free of their attachments. Periosteal elevators were used  to dissect these cephalad. The fibula was cut with a rib cutter cephalad to the skin incision. The tibia was transected with an stryker saw. The anterior aspect of the bone was bevelled. All sharp aspects of the bone edge were smoothed with a bone rasp. The posterior flap was cut with an amputation knife. Hemostasis was achieved with 3-0 silk stick ties and electrocautery. The flap was measured and trimmed to fit the anterior skin border. The flap was irrigated with copious warm saline. The fascia was approximated with interrupted 2-0 vicryl suture. The skin was brought together with staples. A dry sterile dressing was applied, followed by compression.  Anesthesia was successfully terminated. The patient was brought to the recovery room in stable condition.    Given the complexity of the case a first assistant was necessary in order to expedient the procedure and safely perform the technical aspects of the operation.  Macie Burows, MD [facility]  DATE OF DICTATION:   02/16/2021

## 2021-02-16 NOTE — Anesthesia Preprocedure Evaluation (Addendum)
Anesthesia Evaluation  Patient identified by MRN, date of birth, ID band Patient awake    Reviewed: Allergy & Precautions, NPO status , Patient's Chart, lab work & pertinent test results  Airway Mallampati: I       Dental  (+) Edentulous Upper, Dental Advisory Given   Pulmonary Current Smoker,     + decreased breath sounds      Cardiovascular + Peripheral Vascular Disease and +CHF   Rhythm:Regular Rate:Tachycardia  Echo:  1. LV apical false tendon (normal variant). Left ventricular ejection  fraction, by estimation, is 25 to 30%. The left ventricle has severely  decreased function. The left ventricle demonstrates global hypokinesis.  There is mild left ventricular  hypertrophy. Left ventricular diastolic parameters are indeterminate.  2. Right ventricular systolic function is low normal. The right  ventricular size is mildly enlarged. There is normal pulmonary artery  systolic pressure. The estimated right ventricular systolic pressure is  27.7 mmHg.  3. Left atrial size was moderately dilated.  4. The mitral valve is abnormal. Mild mitral valve regurgitation.  Moderate mitral annular calcification.  5. The aortic valve is tricuspid. There is mild calcification of the  aortic valve. Aortic valve regurgitation is not visualized. No aortic  stenosis is present.  6. Aortic dilatation noted. There is borderline dilatation of the  ascending aorta, measuring 39 mm.  7. The inferior vena cava is dilated in size with <50% respiratory  variability, suggesting right atrial pressure of 15 mmHg.    Neuro/Psych PSYCHIATRIC DISORDERS Anxiety Depression  Neuromuscular disease    GI/Hepatic negative GI ROS, Neg liver ROS,   Endo/Other  diabetes  Renal/GU ESRF and DialysisRenal disease     Musculoskeletal  (+) Arthritis ,   Abdominal Normal abdominal exam  (+)   Peds  Hematology   Anesthesia Other Findings    Reproductive/Obstetrics                            Anesthesia Physical Anesthesia Plan  ASA: 3  Anesthesia Plan: General   Post-op Pain Management: Ofirmev IV (intra-op) and Toradol IV (intra-op)   Induction: Intravenous  PONV Risk Score and Plan: 2 and Ondansetron and Midazolam  Airway Management Planned: LMA  Additional Equipment: None  Intra-op Plan:   Post-operative Plan: Extubation in OR  Informed Consent: I have reviewed the patients History and Physical, chart, labs and discussed the procedure including the risks, benefits and alternatives for the proposed anesthesia with the patient or authorized representative who has indicated his/her understanding and acceptance.     Dental advisory given  Plan Discussed with: CRNA  Anesthesia Plan Comments: (Unable to perform regional block due to open (possibly infected) wounds on thigh.  Lab Results      Component                Value               Date                      WBC                      10.4                02/15/2021                HGB  9.3 (L)             02/15/2021                HCT                      29.0 (L)            02/15/2021                MCV                      105.5 (H)           02/15/2021                PLT                      204                 02/15/2021           )       Anesthesia Quick Evaluation

## 2021-02-16 NOTE — Progress Notes (Signed)
Occupational Therapy Treatment Patient Details Name: Brendan Campbell MRN: [id number] DOB: November 01, 1970 Today's Date: 02/16/2021   History of present illness 51 y.o. male Presented 02/12/21 with with missed hemodialysis on Monday and Wednesday which he normally skips.  Also worsening left leg pain, discoloration. +gangrenous changes;    PMH significant of end-stage renal disease (HD MWF), CHF with ejection fraction 20-25%, recent MRSA bacteremia ( on IV vancomycin with HD), right leg AKA, PVD, DM type II, HTN, HLD, anxiety, depression, medically noncompliant   OT comments  Pt was in bed and was able to complete long sitting with HOB elevated. Pt then attempted to assist in posterior slide in bed but required max x 2 assist with pad. Pt was able to complete UE dressing with set up to min assist.  He reported at this time reported they do not want to go to SNF level at this time. If pt declines SNF stay will require max HH services. Pt currently with functional limitations due to the deficits listed below (see OT Problem List).  Pt will benefit from skilled OT to increase their safety and independence with ADL and functional mobility for ADL to facilitate discharge to venue listed below.     Recommendations for follow up therapy are one component of a multi-disciplinary discharge planning process, led by the attending physician.  Recommendations may be updated based on patient status, additional functional criteria and insurance authorization.    Follow Up Recommendations  Skilled nursing-short term rehab (<3 hours/day) (It is recomended to go to SNF level but pt does not want to go. Pt is willing to go home with [place]. If pt goes home will need max HH level)    Assistance Recommended at Discharge Frequent or constant Supervision/Assistance  Patient can return home with the following  A lot of help with walking and/or transfers;A lot of help with bathing/dressing/bathroom;Assist for transportation    Equipment Recommendations   (hospital bed if going home level)    Recommendations for Other Services      Precautions / Restrictions Precautions Precautions: Fall Restrictions Weight Bearing Restrictions: No       Mobility Bed Mobility Overal bed mobility: Needs Assistance             General bed mobility comments: used bed railings with scooting to [name] with max x2    Transfers                   General transfer comment: attempted to complete scotting with long sitting position but required max x2 with pad. Pt reporting pain in R residual limb     Balance Overall balance assessment: Needs assistance Sitting-balance support: Feet supported Sitting balance-Leahy Scale: Good Sitting balance - Comments: Pt able to complete long sitting in bed level when completion of UE dressing                                   ADL either performed or assessed with clinical judgement   ADL Overall ADL's : Needs assistance/impaired Eating/Feeding: Set up;Cueing for safety;Cueing for sequencing;Sitting   Grooming: Set up;Sitting;Wash/dry hands;Wash/dry face;Bed level   Upper Body Bathing: Minimal assistance;Bed level;Sitting   Lower Body Bathing: Moderate assistance;Cueing for safety;Cueing for sequencing;Bed level;Sitting/lateral leans   Upper Body Dressing : Set up;Bed level;Sitting   Lower Body Dressing: Moderate assistance;Cueing for safety;Cueing for sequencing;Sitting/lateral leans  General ADL Comments: Pt limited in activity as reported feeling tired, nursing reported that it required max x two for [facility] transfer in AM    Extremity/Trunk Assessment Upper Extremity Assessment Upper Extremity Assessment: Overall WFL for tasks assessed   Lower Extremity Assessment Lower Extremity Assessment: Defer to PT evaluation RLE Deficits / Details: large area of necrosis over distal, medial residual limb; pt reports it was bleeding and  draining a few days ago, but has stopped since he stopped using prosthesis; ROM WFL RLE Sensation: history of peripheral neuropathy LLE Deficits / Details: edematous foot and lower leg with ++erythema; black eschar noted on dorsum of foot at base of 1-2nd metatarsals. LLE Sensation: history of peripheral neuropathy        Vision   Vision Assessment?: No apparent visual deficits   Perception     Praxis      Cognition Arousal/Alertness: Awake/alert Behavior During Therapy: WFL for tasks assessed/performed Overall Cognitive Status: No family/caregiver present to determine baseline cognitive functioning                                            Exercises     Shoulder Instructions       General Comments      Pertinent Vitals/ Pain       Pain Assessment: Faces Faces Pain Scale: Hurts a little bit Breathing: normal Negative Vocalization: none Facial Expression: smiling or inexpressive Body Language: relaxed Consolability: no need to console PAINAD Score: 0 Pain Location: R residul limb Pain Descriptors / Indicators: Aching Pain Intervention(s): Limited activity within patient's tolerance  Home Living                                          Prior Functioning/Environment              Frequency  Min 2X/week        Progress Toward Goals  OT Goals(current goals can now be found in the care plan section)  Progress towards OT goals: Progressing toward goals  Acute Rehab OT Goals Patient Stated Goal: to be able to go home OT Goal Formulation: With patient Time For Goal Achievement: 02/27/21 Potential to Achieve Goals: Fair ADL Goals Pt Will Perform Lower Body Bathing: with supervision;sitting/lateral leans Pt Will Perform Lower Body Dressing: sitting/lateral leans;with adaptive equipment;with supervision Pt Will Transfer to Toilet: with supervision;anterior/posterior transfer Pt Will Perform Toileting - Clothing  Manipulation and hygiene: with supervision;sitting/lateral leans  Plan Discharge plan needs to be updated    Co-evaluation                 AM-PAC OT "6 Clicks" Daily Activity     Outcome Measure   Help from another person eating meals?: A Little Help from another person taking care of personal grooming?: A Little Help from another person toileting, which includes using toliet, bedpan, or urinal?: A Lot Help from another person bathing (including washing, rinsing, drying)?: A Lot Help from another person to put on and taking off regular upper body clothing?: A Little Help from another person to put on and taking off regular lower body clothing?: A Lot 6 Click Score: 15    End of Session    OT Visit Diagnosis: Other abnormalities of gait and mobility (R26.89);Muscle weakness (  generalized) (M62.81)   Activity Tolerance  (Pt needed to go to sx)   Patient Left in bed;with nursing/sitter in room (going to sx)   Nurse Communication          Time: 2258-3462 OT Time Calculation (min): 15 min  Charges: OT General Charges $OT Visit: 1 Visit OT Treatments $Self Care/Home Management : 8-22 mins  Joeseph Amor OTR/L  Acute Rehab Services  [phone] office number [phone] pager number   Joeseph Amor 02/16/2021, 11:02 AM

## 2021-02-16 NOTE — Progress Notes (Signed)
Pt receives out-pt HD at [facility] on MWF. Pt arrives at 11:15 for 11:30 chair time. Pt uses [facility] for transportation. Will assist as needed.  Melven Sartorius Renal Navigator [phone]

## 2021-02-16 NOTE — Progress Notes (Signed)
PROGRESS NOTE    [name]  OEV:[id number] DOB: Jul 01, 1970 DOA: 02/12/2021 PCP: Monico Blitz, MD   Brief Narrative/Hospital Course:  Brendan Campbell, 51 y.o. male with PMH of sCHF, ESRD on HD, DMII, HLD, PVD, neuropathy, PTSD who presented to [facility] with worsening weakness/fatigue. He had recent MRSA bacteremia and left leg cellulitis and had been on 6 weeks of vancomycin with HD. Patient has missed some doses due to missing HD presented with worsening of left foot osteomyelitis and had dry gangrene on admission; he has agreed for left BKA to be performed on 02/16/2021. ID was reconsulted on Monday.His right stump also looks rough and ID ordered MRI started Rocephin to broaden some coverage.   Subjective: Seen and examined this morning. IV team underway to get IV access since he lost last night. Overnight no fever, blood pressure in the 90s to low 100, saturating on room air Labs with a stable hemoglobin no leukocytosis. Was n.p.o. for amputation  Assessment & Plan:  Extensive left foot wound with significant tissue loss LLE dry gangrenous wound:  vascular surgery following, ABI left with noncompressible left lower extremity arteries, continue IV antibiotics, plan is for left BKA today.  Recent MRSA bacteremia RLE cellulitis: Patient was on 6 weeks of IV vancomycin with HD due to aVF, had negative TTE 12/31st.  Repeat blood cultures 1 week after antibiotic completion, he missed a dose on 1 and 2 and 1 and 4, follow-up ID recommendation, continue vancomycin plus ceftriaxone as per ID.ID on board, blood cultures from 1/6 negative so far.  MRI of the right stump ordered to evaluate-May need to consult orthopedics-await ID inputs.  Hyperkalemia: Due to missed dialysis.  Resolved with dialysis. ESRD on HD MWF- cont per nephro on MWF dialysis managed by nephrology Metabolic bone disease: Monitor calcium and Phos continue PhosLo Anemia of chronic renal disease monitor hemoglobin Recent  Labs  Lab 02/14/21 0821 02/15/21 0558 02/15/21 0924 02/15/21 2231 02/16/21 0958  HGB 9.9* 9.5* 9.1* 9.3* 12.6*  HCT 29.9* 28.7* 28.7* 29.0* 37.0*    PVD History of right BKA: Has some skin abrasion and scabs, supportive care.  Continue aspirin Lipitor and anticoagulation.  Acute on chronic combined systolic and diastolic CHF: Volume status to be managed with dialysis continue aspirin statin Coreg  Chronic A. fib on Eliquis at home on hold and currently on heparin drip once okay with vascular resume Eliquis continue Coreg  Rash diffuse rash papules on back and scattered likely due to staph colonization, outpatient referral and continue symptomatic management  HLD continue Lipitor  T2DM: Last A1c 5.5, diet controlled, monitor CBG  Class I Obesity:Patient's Body mass index is 33.44 kg/m. : Will benefit with PCP follow-up, weight loss  healthy lifestyle and outpatient sleep evaluation.  DVT prophylaxis: TED hose Start: 02/12/21 1234 SCDs Start: 02/12/21 1234 Code Status:   Code Status: Full Code Family Communication: plan of care discussed with patient at bedside. Status is: Inpatient Remains inpatient appropriate because: Management of left foot wound and for BKA Disposition: Currently not medically stable for discharge. Anticipated Disposition:  HH vs SNF  Total time spent in the care of this patient  35 MINUTES Objective: Vitals last 24 hrs: Vitals:   02/15/21 1600 02/15/21 2122 02/16/21 0439 02/16/21 0946  BP: 98/72 118/83 97/80 (!) 120/56  Pulse: 95 92 85 100  Resp:  18 17 18   Temp: 97.8 F (36.6 C) 98 F (36.7 C) 97.6 F (36.4 C) 98.1 F (36.7 C)  TempSrc: Oral Oral  Oral  SpO2: 98% 91% 94% 95%  Weight:      Height:       Weight change:   Intake/Output Summary (Last 24 hours) at 02/16/2021 1224 Last data filed at 02/16/2021 1135 Gross per 24 hour  Intake 1415.76 ml  Output 2947 ml  Net -1531.24 ml   Net IO Since Admission: 320.21 mL [02/16/21 1224]    Physical Examination: General exam: AA0x3, weak,older than stated age. HEENT:Oral mucosa moist, Ear/Nose WNL grossly,dentition normal. Respiratory system: B/l diminished BS, no use of accessory muscle, non tender. Cardiovascular system: S1 & S2 +,No JVD. Gastrointestinal system: Abdomen soft, NT,ND, BS+. Nervous System:Alert, awake, moving extremities. Extremities: Right BKA left foot with dressing in place  Skin: No rashes, no icterus. MSK: Normal muscle bulk, tone, power. AV graft on left upper extremity  Medications reviewed:  Scheduled Meds:  aspirin  81 mg Oral Daily   atorvastatin  10 mg Oral Q M,W,F-1800   calcium acetate  667 mg Oral TID WC   calcium carbonate  800 mg of elemental calcium Oral BID   carvedilol  3.125 mg Oral BID WC   cholecalciferol  1,000 Units Oral Q M,W,F   darbepoetin (ARANESP) injection - DIALYSIS  40 mcg Intravenous Q Mon-HD   mupirocin ointment  1 application Nasal BID   mupirocin ointment       pantoprazole  20 mg Oral Daily   sodium chloride flush  3 mL Intravenous Q12H   sodium chloride flush  3 mL Intravenous Q12H   Continuous Infusions:  sodium chloride     cefTRIAXone (ROCEPHIN)  IV     heparin Stopped (02/15/21 2237)   vancomycin 1,000 mg (02/15/21 1105)   Diet Order             Diet NPO time specified  Diet effective midnight                          Weight change:   Wt Readings from Last 3 Encounters:  02/15/21 102.7 kg  02/05/21 93.4 kg  12/07/20 90.7 kg     Consultants:see note  Procedures:see note Antimicrobials: Anti-infectives (From admission, onward)    Start     Dose/Rate Route Frequency Ordered Stop   02/15/21 1100  cefTRIAXone (ROCEPHIN) 2 g in sodium chloride 0.9 % 100 mL IVPB        2 g 200 mL/hr over 30 Minutes Intravenous Every 24 hours 02/15/21 1042     02/12/21 1200  vancomycin (VANCOCIN) IVPB 1000 mg/200 mL premix        1,000 mg 200 mL/hr over 60 Minutes Intravenous Every M-W-F  (Hemodialysis) 02/12/21 1117        Culture/Microbiology    Component Value Date/Time   SDES BLOOD RIGHT FOREARM 02/12/2021 1055   SPECREQUEST  02/12/2021 1055    BOTTLES DRAWN AEROBIC AND ANAEROBIC Blood Culture adequate volume   CULT  02/12/2021 1055    NO GROWTH 4 DAYS Performed at [facility], [address], [place], [place] [address]    REPTSTATUS PENDING 02/12/2021 1055    Other culture-see note  Unresulted Labs (From admission, onward)     Start     Ordered   02/16/21 0500  Phosphorus  Daily,   R      02/15/21 1429   02/14/21 0500  CBC with Differential/Platelet  Daily,   R      02/13/21 1504   02/14/21 0500  Magnesium  Daily,   R      02/13/21 1504   02/13/21 1287  Basic metabolic panel  Daily,   R      02/12/21 1238   Signed and Held  Hepatitis B surface antigen  (New Admission [facility] (Hepatitis B))  Once,   R        Signed and Held   Signed and Held  Hepatitis B surface antibody  (New Admission [facility] (Hepatitis B))  Once,   R        Signed and Held   Signed and Held  Hepatitis B surface antibody,quantitative  (New Admission [facility]o Labs (Hepatitis B))  Once,   R        Signed and Held          Data Reviewed: I have personally reviewed following labs and imaging studies CBC: Recent Labs  Lab 02/12/21 1005 02/13/21 0304 02/14/21 0821 02/15/21 0558 02/15/21 0924 02/15/21 2231 02/16/21 0958  WBC 8.3 9.6 11.1* 12.8* 12.6* 10.4  --   NEUTROABS 5.7  --  8.4* 9.9*  --  7.8*  --   HGB 11.3* 9.7* 9.9* 9.5* 9.1* 9.3* 12.6*  HCT 35.2* 29.2* 29.9* 28.7* 28.7* 29.0* 37.0*  MCV 108.3* 103.5* 106.0* 104.7* 105.9* 105.5*  --   PLT 233 198 231 233 230 204  --    Basic Metabolic Panel: Recent Labs  Lab 02/12/21 1005 02/13/21 0304 02/13/21 1209 02/13/21 1424 02/14/21 0821 02/15/21 0558 02/15/21 0923 02/15/21 2231 02/16/21 0958  NA 135   < >  --  136 132* 134* 132* 133* 133*  K >7.5*   < >  --  5.7* 5.9* 5.3* 5.3* 4.5 4.3  CL 95*   < >  --  97* 95*  93* 89* 95* 95*  CO2 22   < >  --  24 21* 25 25 26   --   GLUCOSE 92   < >  --  84 113* 82 90 86 87  BUN 76*   < >  --  39* 45* 47* 49* 24* 30*  CREATININE 8.89*   < >  --  6.23* 6.58* 7.43* 7.41* 4.61* 5.30*  CALCIUM 8.9   < >  --  9.2 9.2 9.7 10.0 8.5*  --   MG 2.3  --  2.1  --  2.0 2.2  --  1.9  --   PHOS  --   --  5.8*  --   --   --  5.6* 3.9  --    < > = values in this interval not displayed.   GFR: Estimated Creatinine Clearance: 19.9 mL/min (A) (by C-G formula based on SCr of 5.3 mg/dL (H)). Liver Function Tests: Recent Labs  Lab 02/12/21 1005 02/15/21 0923  AST 17  --   ALT 12  --   ALKPHOS 83  --   BILITOT 1.0  --   PROT 7.1  --   ALBUMIN 3.3* 2.7*   No results for input(s): LIPASE, AMYLASE in the last 168 hours. No results for input(s): AMMONIA in the last 168 hours. Coagulation Profile: Recent Labs  Lab 02/12/21 1005 02/13/21 0304  INR 1.4* 1.4*   Cardiac Enzymes: No results for input(s): CKTOTAL, CKMB, CKMBINDEX, TROPONINI in the last 168 hours. BNP (last 3 results) No results for input(s): PROBNP in the last 8760 hours. HbA1C: No results for input(s): HGBA1C in the last 72 hours. CBG: Recent Labs  Lab 02/12/21 2214 02/16/21 0732  GLUCAP 101* 104*   Lipid Profile: No results for input(s): CHOL, HDL, LDLCALC, TRIG, CHOLHDL, LDLDIRECT in the last 72 hours. Thyroid Function Tests: No results for input(s): TSH, T4TOTAL, FREET4, T3FREE, THYROIDAB in the last 72 hours. Anemia Panel: Recent Labs    02/15/21 0558  FERRITIN 860*  TIBC 192*  IRON 41*   Sepsis Labs: Recent Labs  Lab 02/12/21 1006 02/12/21 1234  LATICACIDVEN 2.3* 2.7*    Recent Results (from the past 240 hour(s))  Resp Panel by RT-PCR (Flu A&B, Covid) Nasopharyngeal Swab     Status: None   Collection Time: 02/12/21  9:48 AM   Specimen: Nasopharyngeal Swab; Nasopharyngeal(NP) swabs in vial transport medium  Result Value Ref Range Status   SARS Coronavirus 2 by RT PCR NEGATIVE  NEGATIVE Final    Comment: (NOTE) SARS-CoV-2 target nucleic acids are NOT DETECTED.  The SARS-CoV-2 RNA is generally detectable in upper respiratory specimens during the acute phase of infection. The lowest concentration of SARS-CoV-2 viral copies this assay can detect is 138 copies/mL. A negative result does not preclude SARS-Cov-2 infection and should not be used as the sole basis for treatment or other patient management decisions. A negative result may occur with  improper specimen collection/handling, submission of specimen other than nasopharyngeal swab, presence of viral mutation(s) within the areas targeted by this assay, and inadequate number of viral copies(<138 copies/mL). A negative result must be combined with clinical observations, patient history, and epidemiological information. The expected result is Negative.  Fact Sheet for Patients:  EntrepreneurPulse.com.au  Fact Sheet for Healthcare Providers:  IncredibleEmployment.be  This test is no t yet approved or cleared by the [place] FDA and  has been authorized for detection and/or diagnosis of SARS-CoV-2 by FDA under an Emergency Use Authorization (EUA). This EUA will remain  in effect (meaning this test can be used) for the duration of the COVID-19 declaration under Section 564(b)(1) of the Act, 21 U.S.C.section 360bbb-3(b)(1), unless the authorization is terminated  or revoked sooner.       Influenza A by PCR NEGATIVE NEGATIVE Final   Influenza B by PCR NEGATIVE NEGATIVE Final    Comment: (NOTE) The Xpert Xpress SARS-CoV-2/FLU/RSV plus assay is intended as an aid in the diagnosis of influenza from Nasopharyngeal swab specimens and should not be used as a sole basis for treatment. Nasal washings and aspirates are unacceptable for Xpert Xpress SARS-CoV-2/FLU/RSV testing.  Fact Sheet for Patients: EntrepreneurPulse.com.au  Fact Sheet for Healthcare  Providers: IncredibleEmployment.be  This test is not yet approved or cleared by the [place] FDA and has been authorized for detection and/or diagnosis of SARS-CoV-2 by FDA under an Emergency Use Authorization (EUA). This EUA will remain in effect (meaning this test can be used) for the duration of the COVID-19 declaration under Section 564(b)(1) of the Act, 21 U.S.C. section 360bbb-3(b)(1), unless the authorization is terminated or revoked.  Performed at [facility], [address] [place], [place] [address]   Blood culture (routine x 2)     Status: None (Preliminary result)   Collection Time: 02/12/21 10:05 AM   Specimen: Right Antecubital; Blood  Result Value Ref Range Status   Specimen Description RIGHT ANTECUBITAL  Final   Special Requests   Final    BOTTLES DRAWN AEROBIC AND ANAEROBIC Blood Culture adequate volume   Culture   Final    NO GROWTH 4 DAYS Performed at [facility], [address], [place], [place] [address]    Report Status PENDING  Incomplete  Blood culture (routine x 2)     Status: None (Preliminary result)   Collection Time: 02/12/21 10:55 AM   Specimen: BLOOD RIGHT FOREARM  Result Value Ref Range Status   Specimen Description BLOOD RIGHT FOREARM  Final   Special Requests   Final    BOTTLES DRAWN AEROBIC AND ANAEROBIC Blood Culture adequate volume   Culture   Final    NO GROWTH 4 DAYS Performed at [facility], [address] [place], [place] [address]    Report Status PENDING  Incomplete  Surgical pcr screen     Status: Abnormal   Collection Time: 02/16/21  5:46 AM   Specimen: Nasal Mucosa; Nasal Swab  Result Value Ref Range Status   MRSA, PCR POSITIVE (A) NEGATIVE Final    Comment: RESULT CALLED TO, READ BACK BY AND VERIFIED WITH: RN T BROWN [id number] AT 66 AM BY CM    Staphylococcus aureus POSITIVE (A) NEGATIVE Final    Comment: (NOTE) The Xpert SA Assay (FDA approved for NASAL specimens in patients 80 years of age and  older), is one component of a comprehensive surveillance program. It is not intended to diagnose infection nor to guide or monitor treatment. Performed at [facility], [place] [address], [place], [place] [address]      Radiology Studies: VAS [place] ABI WITH/WO TBI  Result Date: 02/15/2021  LOWER EXTREMITY DOPPLER STUDY Patient Name:  MARCELLIUS MONTAGNA  Date of Exam:   02/15/2021 Medical Rec #: [id number]       Accession #:    [id number] Date of Birth: Dec 26, 1970        Patient Gender: M Patient Age:   56 years Exam Location:  [facility] Procedure:      VAS [place] ABI WITH/WO TBI Referring Phys: JOSHUA ROBINS --------------------------------------------------------------------------------  Indications: Left Ischemia, critical limb. High Risk Factors: Hyperlipidemia, Diabetes.  Vascular Interventions: Rt bka done. Comparison Study: no prior Performing Technologist: Archie Patten RVS  Examination Guidelines: A complete evaluation includes at minimum, Doppler waveform signals and systolic blood pressure reading at the level of bilateral brachial, anterior tibial, and posterior tibial arteries, when vessel segments are accessible. Bilateral testing is considered an integral part of a complete examination. Photoelectric Plethysmograph (PPG) waveforms and toe systolic pressure readings are included as required and additional duplex testing as needed. Limited examinations for reoccurring indications may be performed as noted.  ABI Findings: +--------+------------------+-----+---------+--------+  Right    Rt Pressure (mmHg) Index Waveform  Comment   +--------+------------------+-----+---------+--------+  Brachial 123                      triphasic           +--------+------------------+-----+---------+--------+  PTA                                         bka       +--------+------------------+-----+---------+--------+  DP                                          bka        +--------+------------------+-----+---------+--------+ +---------+------------------+-----+--------+----------------------------+  Left      Lt Pressure (mmHg) Index Waveform Comment                       +---------+------------------+-----+--------+----------------------------+  Brachial                                    fistula                       +---------+------------------+-----+--------+----------------------------+  PTA       255                2.07  biphasic                               +---------+------------------+-----+--------+----------------------------+  DP        255                2.07  biphasic                               +---------+------------------+-----+--------+----------------------------+  Great Toe                                   great toe wound and bandaged  +---------+------------------+-----+--------+----------------------------+ +-------+-----------+-----------+------------+------------+  ABI/TBI Today's ABI Today's TBI Previous ABI Previous TBI  +-------+-----------+-----------+------------+------------+  Right   bka                                                +-------+-----------+-----------+------------+------------+  Left    2.07                                               +-------+-----------+-----------+------------+------------+   Summary: Right: Bka. 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  *See table(s) above for measurements and observations.  Electronically signed by Servando Snare MD on 02/15/2021 at 5:54:07 PM.    Final      LOS: 4 days   Antonieta Pert, MD [facility]  02/16/2021, 12:24 PM

## 2021-02-16 NOTE — Progress Notes (Signed)
PT Cancellation Note  Patient Details Name: RAMELL WACHA MRN: [id number] DOB: Nov 24, 1970   Cancelled Treatment:    Reason Eval/Treat Not Completed: Patient at procedure or test/unavailable (planned L BKA today)  Wyona Almas, PT, DPT Acute Rehabilitation Services Pager [phone] Office [phone]    Deno Etienne 02/16/2021, 9:35 AM

## 2021-02-16 NOTE — Progress Notes (Signed)
°  Progress Note    02/16/2021 11:11 AM Day of Surgery  Subjective:  no complaints this morning   Vitals:   02/16/21 0439 02/16/21 0946  BP: 97/80 (!) 120/56  Pulse: 85 100  Resp: 17 18  Temp: 97.6 F (36.4 C) 98.1 F (36.7 C)  SpO2: 94% 95%   Physical Exam: Lungs:  non labored Extremities:  L foot necrotic wound with surrounding erythema Neurologic: A&O  CBC    Component Value Date/Time   WBC 10.4 02/15/2021 2231   RBC 2.75 (L) 02/15/2021 2231   HGB 12.6 (L) 02/16/2021 0958   HGB 10.4 (L) 06/02/2016 1107   HCT 37.0 (L) 02/16/2021 0958   HCT 32.9 (L) 06/02/2016 1107   PLT 204 02/15/2021 2231   PLT 389 (H) 06/02/2016 1107   MCV 105.5 (H) 02/15/2021 2231   MCV 94 06/02/2016 1107   MCH 33.8 02/15/2021 2231   MCHC 32.1 02/15/2021 2231   RDW 18.0 (H) 02/15/2021 2231   RDW 16.0 (H) 06/02/2016 1107   LYMPHSABS 0.9 02/15/2021 2231   LYMPHSABS 2.9 06/02/2016 1107   MONOABS 0.6 02/15/2021 2231   EOSABS 1.0 (H) 02/15/2021 2231   EOSABS 0.5 (H) 06/02/2016 1107   BASOSABS 0.1 02/15/2021 2231   BASOSABS 0.1 06/02/2016 1107    BMET    Component Value Date/Time   NA 133 (L) 02/16/2021 0958   NA 142 12/16/2015 1051   K 4.3 02/16/2021 0958   CL 95 (L) 02/16/2021 0958   CO2 26 02/15/2021 2231   GLUCOSE 87 02/16/2021 0958   BUN 30 (H) 02/16/2021 0958   BUN 45 (H) 12/16/2015 1051   CREATININE 5.30 (H) 02/16/2021 0958   CALCIUM 8.5 (L) 02/15/2021 2231   GFRNONAA 15 (L) 02/15/2021 2231   GFRAA 9 (L) 09/27/2019 0951    INR    Component Value Date/Time   INR 1.4 (H) 02/13/2021 0304     Intake/Output Summary (Last 24 hours) at 02/16/2021 1111 Last data filed at 02/16/2021 0200 Gross per 24 hour  Intake 1315.76 ml  Output 2947 ml  Net -1631.24 ml      Assessment/Plan:  51 y.o. male with extensive L foot wound  Patient was significant tissue loss in the left foot.  We had a long discussion regarding TMA versus below-knee amputation.  I am afraid that  transmetatarsal amputation has a high failure rate, especially with his longstanding diabetes and end-stage renal disease.  He asked to have the most definitive amputation out of the 2 options, and therefore we will pursue below-knee amputation. After discussing the risk and benefits, Fredrik elected to proceed.  Cassandria Santee, MD [facility] Phone Number: [phone] 02/16/2021 11:11 AM

## 2021-02-17 ENCOUNTER — Encounter (HOSPITAL_COMMUNITY): Payer: Self-pay | Admitting: Vascular Surgery

## 2021-02-17 DIAGNOSIS — E875 Hyperkalemia: Secondary | ICD-10-CM | POA: Diagnosis not present

## 2021-02-17 LAB — CBC WITH DIFFERENTIAL/PLATELET
Abs Immature Granulocytes: 0.05 10*3/uL (ref 0.00–0.07)
Basophils Absolute: 0 10*3/uL (ref 0.0–0.1)
Basophils Relative: 0 %
Eosinophils Absolute: 0 10*3/uL (ref 0.0–0.5)
Eosinophils Relative: 0 %
HCT: 29.1 % — ABNORMAL LOW (ref 39.0–52.0)
Hemoglobin: 9.3 g/dL — ABNORMAL LOW (ref 13.0–17.0)
Immature Granulocytes: 1 %
Lymphocytes Relative: 5 %
Lymphs Abs: 0.5 10*3/uL — ABNORMAL LOW (ref 0.7–4.0)
MCH: 33.9 pg (ref 26.0–34.0)
MCHC: 32 g/dL (ref 30.0–36.0)
MCV: 106.2 fL — ABNORMAL HIGH (ref 80.0–100.0)
Monocytes Absolute: 0.2 10*3/uL (ref 0.1–1.0)
Monocytes Relative: 1 %
Neutro Abs: 9.8 10*3/uL — ABNORMAL HIGH (ref 1.7–7.7)
Neutrophils Relative %: 93 %
Platelets: 235 10*3/uL (ref 150–400)
RBC: 2.74 MIL/uL — ABNORMAL LOW (ref 4.22–5.81)
RDW: 18.5 % — ABNORMAL HIGH (ref 11.5–15.5)
WBC: 10.5 10*3/uL (ref 4.0–10.5)
nRBC: 0.4 % — ABNORMAL HIGH (ref 0.0–0.2)

## 2021-02-17 LAB — BASIC METABOLIC PANEL
Anion gap: 13 (ref 5–15)
BUN: 36 mg/dL — ABNORMAL HIGH (ref 6–20)
CO2: 25 mmol/L (ref 22–32)
Calcium: 9.3 mg/dL (ref 8.9–10.3)
Chloride: 93 mmol/L — ABNORMAL LOW (ref 98–111)
Creatinine, Ser: 5.82 mg/dL — ABNORMAL HIGH (ref 0.61–1.24)
GFR, Estimated: 11 mL/min — ABNORMAL LOW (ref >60)
Glucose, Bld: 127 mg/dL — ABNORMAL HIGH (ref 70–99)
Potassium: 5.3 mmol/L — ABNORMAL HIGH (ref 3.5–5.1)
Sodium: 131 mmol/L — ABNORMAL LOW (ref 135–145)

## 2021-02-17 LAB — CULTURE, BLOOD (ROUTINE X 2)
Culture: NO GROWTH
Culture: NO GROWTH
Special Requests: ADEQUATE
Special Requests: ADEQUATE

## 2021-02-17 LAB — MAGNESIUM: Magnesium: 2.3 mg/dL (ref 1.7–2.4)

## 2021-02-17 LAB — GLUCOSE, CAPILLARY: Glucose-Capillary: 124 mg/dL — ABNORMAL HIGH (ref 70–99)

## 2021-02-17 LAB — PHOSPHORUS: Phosphorus: 5.4 mg/dL — ABNORMAL HIGH (ref 2.5–4.6)

## 2021-02-17 MED ORDER — HYDROMORPHONE HCL 1 MG/ML IJ SOLN
INTRAMUSCULAR | Status: AC
  Filled 2021-02-17: qty 0.5

## 2021-02-17 MED ORDER — SODIUM CHLORIDE 0.9 % IV SOLN: 100.0000 mL | INTRAVENOUS | Status: DC | PRN

## 2021-02-17 MED ORDER — CEPHALEXIN 500 MG PO CAPS
500.0000 mg | ORAL_CAPSULE | Freq: Two times a day (BID) | ORAL | Status: DC
  Administered 2021-02-17 – 2021-02-19 (×5): 500 mg via ORAL
  Filled 2021-02-17 (×6): qty 1

## 2021-02-17 MED ORDER — LIDOCAINE HCL (PF) 1 % IJ SOLN: 5.0000 mL | INTRAMUSCULAR | Status: DC | PRN

## 2021-02-17 MED ORDER — DIPHENHYDRAMINE HCL 25 MG PO CAPS
25.0000 mg | ORAL_CAPSULE | Freq: Three times a day (TID) | ORAL | Status: DC | PRN
  Administered 2021-02-17: 25 mg via ORAL
  Filled 2021-02-17: qty 1

## 2021-02-17 MED ORDER — CAMPHOR-MENTHOL 0.5-0.5 % EX LOTN
TOPICAL_LOTION | CUTANEOUS | Status: DC | PRN
  Filled 2021-02-17: qty 222

## 2021-02-17 MED ORDER — PENTAFLUOROPROP-TETRAFLUOROETH EX AERO: 1.0000 "application " | INHALATION_SPRAY | CUTANEOUS | Status: DC | PRN

## 2021-02-17 MED ORDER — LIDOCAINE-PRILOCAINE 2.5-2.5 % EX CREA: 1.0000 "application " | TOPICAL_CREAM | CUTANEOUS | Status: DC | PRN

## 2021-02-17 NOTE — Anesthesia Postprocedure Evaluation (Signed)
Anesthesia Post Note  Patient: Brendan Campbell  Procedure(s) Performed: AMPUTATION BELOW KNEE (Left: Foot)     Patient location during evaluation: PACU Anesthesia Type: General Level of consciousness: awake and alert Pain management: pain level controlled Vital Signs Assessment: post-procedure vital signs reviewed and stable Respiratory status: spontaneous breathing, nonlabored ventilation, respiratory function stable and patient connected to nasal cannula oxygen Cardiovascular status: blood pressure returned to baseline and stable Postop Assessment: no apparent nausea or vomiting Anesthetic complications: no   No notable events documented.        Effie Berkshire

## 2021-02-17 NOTE — Progress Notes (Signed)
PT Cancellation Note  Patient Details Name: Brendan Campbell MRN: [id number] DOB: 1970-08-28   Cancelled Treatment:    Reason Eval/Treat Not Completed: Patient at procedure or test/unavailable (HD).  Wyona Almas, PT, DPT Acute Rehabilitation Services Pager [phone] Office [phone]    Deno Etienne 02/17/2021, 7:42 AM

## 2021-02-17 NOTE — Progress Notes (Signed)
Antimicrobial Stewardship Pharmacist Note:  Brendan Campbell presented to the hospital on Vancomycin MWF with HD for MRSA bacteremia. He is now s/p L BKA and documented cellulitis on R stump. ID is following and will continue Vancomycin with original planned stop date of 2/6.  Indication: MRSA bacteremia Regimen: Vancomycin  1g IV MWF with HD Stop date: 03/15/21 Cephalexin 500 mg PO BID x 3 weeks Stop date: 03/08/21  Lestine Box, PharmD PGY2 Infectious Diseases Pharmacy Resident   Please check [email or web address] for unit-specific pharmacy phone numbers

## 2021-02-17 NOTE — Progress Notes (Signed)
[name] Progress Note   Subjective: Seen on HD. Initially asking me to shorten treatment but discussed the fact that he is severely volume overloaded. He agrees to run 4 hours today and return for extra treatment tomorrow.    Objective Vitals:   02/17/21 0750 02/17/21 0800 02/17/21 0830 02/17/21 0900  BP: 128/67 127/74 128/72 134/71  Pulse: 86 83 89 87  Resp:  18    Temp:      TempSrc:      SpO2:      Weight:      Height:       Physical Exam General:chronically ill appearing male in NAD Heart:RRR, no mrg Lungs:CTAB, nml WOB on RA Abdomen:obese, NABS,  NTND Extremities:R BKA- stump edema with open oozing lesion on R stump from abrasion from stretcher. He has R pitting stump edema, edema upper portion of thighs, hips and sacral area.  L BKA today ACE wrap intact. .  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 Lesions excoriated, various stages of healing. Abrasion on chest from scratching.  Dialysis Access: LU AVF +b/t  Dialysis Orders:  Additional Objective Labs: Basic Metabolic Panel: Recent Labs  Lab 02/15/21 0923 02/15/21 2231 02/16/21 0958 02/17/21 0220  NA 132* 133* 133* 131*  K 5.3* 4.5 4.3 5.3*  CL 89* 95* 95* 93*  CO2 25 26  --  25  GLUCOSE 90 86 87 127*  BUN 49* 24* 30* 36*  CREATININE 7.41* 4.61* 5.30* 5.82*  CALCIUM 10.0 8.5*  --  9.3  PHOS 5.6* 3.9  --  5.4*   Liver Function Tests: Recent Labs  Lab 02/12/21 1005 02/15/21 0923  AST 17  --   ALT 12  --   ALKPHOS 83  --   BILITOT 1.0  --   PROT 7.1  --   ALBUMIN 3.3* 2.7*   No results for input(s): LIPASE, AMYLASE in the last 168 hours. CBC: Recent Labs  Lab 02/14/21 0821 02/15/21 0558 02/15/21 0924 02/15/21 2231 02/16/21 0958 02/17/21 0220  WBC 11.1* 12.8* 12.6* 10.4  --  10.5  NEUTROABS 8.4* 9.9*  --  7.8*  --  9.8*  HGB 9.9* 9.5* 9.1* 9.3* 12.6* 9.3*  HCT 29.9* 28.7* 28.7* 29.0* 37.0* 29.1*  MCV 106.0* 104.7* 105.9* 105.5*  --  106.2*  PLT 231 233 230  204  --  235   Blood Culture    Component Value Date/Time   SDES BLOOD RIGHT FOREARM 02/12/2021 1055   SPECREQUEST  02/12/2021 1055    BOTTLES DRAWN AEROBIC AND ANAEROBIC Blood Culture adequate volume   CULT  02/12/2021 1055    NO GROWTH 5 DAYS Performed at [facility], [address], [place], [place] [address]    REPTSTATUS 02/17/2021 FINAL 02/12/2021 1055    Cardiac Enzymes: No results for input(s): CKTOTAL, CKMB, CKMBINDEX, TROPONINI in the last 168 hours. CBG: Recent Labs  Lab 02/12/21 2214 02/16/21 0732 02/16/21 1321 02/17/21 0646  GLUCAP 101* 104* 91 124*   Iron Studies:  Recent Labs    02/15/21 0558  IRON 41*  TIBC 192*  FERRITIN 860*   @lablastinr3 @ Studies/Results: MR FRMUR RIGHT WO CONTRAST  Result Date: 02/16/2021 CLINICAL DATA:  Bacteremia. EXAM: MRI OF THE RIGHT FEMUR WITHOUT CONTRAST TECHNIQUE: Multiplanar, multisequence MR imaging of the right femur was performed. No intravenous contrast was administered. COMPARISON:  None. FINDINGS: Bones/Joint/Cartilage Marrow signal is within normal limits. No evidence of osteomyelitis. No appreciable hip joint effusion or evidence of septic arthritis. Ligaments Intact  Muscles and Tendons Marked muscle edema prominent about rectus lateral talus with trace amount of fluid superficial to the vastus lateralis. There is also mild edema of the hamstring muscles. No intramuscular fluid collection or abscess. Soft tissues Marked skin thickening and subcutaneous soft tissue edema of bilateral lower extremities consistent with cellulitis. No drainable fluid collection or abscess. IMPRESSION: 1.  No evidence of osteomyelitis or septic arthritis. 2. Generalized edema prominent in the right vastus lateralis and about the hamstring muscles concerning for myositis. No drainable fluid collection or abscess. 3. Marked skin thickening and subcutaneous soft tissue edema consistent with diffuse cellulitis. No drainable fluid collection or  abscess. Electronically Signed   By: Keane Police D.O.   On: 02/16/2021 18:58   VAS [place] ABI WITH/WO TBI  Result Date: 02/15/2021  LOWER EXTREMITY DOPPLER STUDY Patient Name:  Brendan Campbell  Date of Exam:   02/15/2021 Medical Rec #: [id number]       Accession #:    [id number] Date of Birth: February 04, 1971        Patient Gender: M Patient Age:   51 years Exam Location:  [facility] Procedure:      VAS [place] ABI WITH/WO TBI Referring Phys: JOSHUA ROBINS --------------------------------------------------------------------------------  Indications: Left Ischemia, critical limb. High Risk Factors: Hyperlipidemia, Diabetes.  Vascular Interventions: Rt bka done. Comparison Study: no prior Performing Technologist: Archie Patten RVS  Examination Guidelines: A complete evaluation includes at minimum, Doppler waveform signals and systolic blood pressure reading at the level of bilateral brachial, anterior tibial, and posterior tibial arteries, when vessel segments are accessible. Bilateral testing is considered an integral part of a complete examination. Photoelectric Plethysmograph (PPG) waveforms and toe systolic pressure readings are included as required and additional duplex testing as needed. Limited examinations for reoccurring indications may be performed as noted.  ABI Findings: +--------+------------------+-----+---------+--------+  Right    Rt Pressure (mmHg) Index Waveform  Comment   +--------+------------------+-----+---------+--------+  Brachial 123                      triphasic           +--------+------------------+-----+---------+--------+  PTA                                         bka       +--------+------------------+-----+---------+--------+  DP                                          bka       +--------+------------------+-----+---------+--------+ +---------+------------------+-----+--------+----------------------------+  Left      Lt Pressure (mmHg) Index Waveform Comment                        +---------+------------------+-----+--------+----------------------------+  Brachial                                    fistula                       +---------+------------------+-----+--------+----------------------------+  PTA       255                2.07  biphasic                               +---------+------------------+-----+--------+----------------------------+  DP        255                2.07  biphasic                               +---------+------------------+-----+--------+----------------------------+  Great Toe                                   great toe wound and bandaged  +---------+------------------+-----+--------+----------------------------+ +-------+-----------+-----------+------------+------------+  ABI/TBI Today's ABI Today's TBI Previous ABI Previous TBI  +-------+-----------+-----------+------------+------------+  Right   bka                                                +-------+-----------+-----------+------------+------------+  Left    2.07                                               +-------+-----------+-----------+------------+------------+   Summary: Right: Bka. 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  *See table(s) above for measurements and observations.  Electronically signed by Servando Snare MD on 02/15/2021 at 5:54:07 PM.    Final    Medications:  sodium chloride     sodium chloride     sodium chloride     cefTRIAXone (ROCEPHIN)  IV     vancomycin Stopped (02/15/21 1205)    aspirin  81 mg Oral Daily   atorvastatin  10 mg Oral Q M,W,F-1800   calcium acetate  667 mg Oral TID WC   calcium carbonate  800 mg of elemental calcium Oral BID   carvedilol  3.125 mg Oral BID WC   cholecalciferol  1,000 Units Oral Q M,W,F   darbepoetin (ARANESP) injection - DIALYSIS  40 mcg Intravenous Q Mon-HD   mupirocin ointment  1 application Nasal BID   pantoprazole  20 mg Oral Daily   sodium chloride flush  3 mL Intravenous  Q12H   sodium chloride flush  3 mL Intravenous Q12H     OP HD: [facility] MWF  4h 75min  95 kg   1K/2.25 bath  Hep 1000+ 500u/hr  LUE AVG  300/500   - prosthetic weighs 1.6 kg  - Epogen 1000   Units IV/HD     Assessment/Plan:  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 Virl Cagey. Excoriation and open lesions R Stump. Primary aware. Ask VVS to revisit.  MRSA bacteremia - missed 1 week of IV vancomycin. Resume IV vanc. ID consulted. Added ceftriaxone. Per primary  ESRD -  Next HD today, in progress. Extra treatment tomorrow. Will run sequential, volume removal only.   Hypertension/volume  - Blood pressure at goal. . Max UF next HD on Wed. Still very much above OP EDW. Max UF goal as tolerated with HD today. Extra treatment 02/18/2021   Anemia  - Hgb 9.3, on ESA. Given Aranesp 40 mcg IV 02/15/2021.   Metabolic bone disease -   Calcium in goal. PO4 at goal  Continue home meds.  Not on VDRA.   Nutrition - renal diet, carb modified with fluid restrictions.   Prurigo Nodularis-excessive pruritis, skin excoriated from  itching. Would consider Korsuva as OP but will add benadryl and sarna lotion for now. PO4 at goal.   Disposition: Does NOT wish to go to rehab. Wants to return home on discharge.     Javarius Tsosie H. Obe Ahlers NP-C 02/17/2021, 9:21 AM  [employer] [phone]

## 2021-02-17 NOTE — Progress Notes (Signed)
[facility]  Date of Admission:  02/12/2021   Total days of inpatient antibiotics 4  Principal Problem:   Hyperkalemia Active Problems:   Type 2 diabetes mellitus with diabetic chronic kidney disease (HCC)   Hyperlipidemia   S/P below knee amputation, right (HCC)   ESRD on hemodialysis (HCC)   Peripheral vascular disease (HCC)   Hypocalcemia   Chronic atrial fibrillation (HCC)   Acute on chronic combined systolic and diastolic CHF (congestive heart failure) (HCC)   Pain in limb   Left hemiparesis (HCC)   Volume overload   MRSA bacteremia   Rash          Assessment: 74 YM with CHF, ESRD on iHD MWF, DM (A1c 5.5 on 02/05/21), recent MRSA bacteremia 2/2 LLE cellulitis  on 6 weeks of vancomycin in the setting of AVF admitted for hyperkalemia an worsening LLE wound.      #LLE dry gangrenous wound #RLE cellulitis #Hx of MRSA bacteremia 2/2 LLE cellulitis #PVD -Blood Cx from 1/6 remain negative -TTE on 02/06/21 showed no evidence of vegetation -CT angio b/l did not identity left sided DP and 1.3cm soft tissue ulcer on medial aspect of left foot. Vascular surgery engaged and recommend ABIs which showed severely calcified vessels. He underwent BKA on 02/17/20 with vascular surgery. -Although pt missed a couple doses of vancomycin in the interim, Blood Cx remain negative.  Would added ceftriaxone to broaden coverage for  cellulitis of RLE. MRI right femur  showed concern for myositis/cellulitis without abscess   Recommendations: -Continue Vancomycin (complete 6 weeks of antibiotics EOT 03/15/21) -D/C ceftriaxone -Start  keflex to complete 3 weeks of antibiotics for cellulitis (03/08/21) -ID will sign off    Microbiology:   Antibiotics: Vancomycin 12/30-p(missed 1/2 and 1/4)  Ceftriaxone 1/9-p Cultures: Blood 1/6 NGTD 12/31 NGTD  SUBJECTIVE: No new complaints. No significant overnight events.   Review of Systems: Review of Systems  All  other systems reviewed and are negative.   Scheduled Meds:  aspirin  81 mg Oral Daily   atorvastatin  10 mg Oral Q M,W,F-1800   calcium acetate  667 mg Oral TID WC   calcium carbonate  800 mg of elemental calcium Oral BID   carvedilol  3.125 mg Oral BID WC   cephALEXin  500 mg Oral Q12H   cholecalciferol  1,000 Units Oral Q M,W,F   darbepoetin (ARANESP) injection - DIALYSIS  40 mcg Intravenous Q Mon-HD   mupirocin ointment  1 application Nasal BID   pantoprazole  20 mg Oral Daily   sodium chloride flush  3 mL Intravenous Q12H   sodium chloride flush  3 mL Intravenous Q12H   Continuous Infusions:  sodium chloride     vancomycin Stopped (02/17/21 1230)   PRN Meds:.sodium chloride, acetaminophen **OR** acetaminophen, bisacodyl, butalbital-acetaminophen-caffeine, camphor-menthol, diphenhydrAMINE, hydrALAZINE, HYDROmorphone (DILAUDID) injection, ipratropium, oxyCODONE, povidone-iodine, senna-docusate, sodium chloride flush, zolpidem Allergies  Allergen Reactions   Tape Other (See Comments)    Pulls skin off   Chlorhexidine Gluconate [Chlorhexidine] Rash    OBJECTIVE: Vitals:   02/17/21 1100 02/17/21 1130 02/17/21 1150 02/17/21 1237  BP: 116/63 (!) 129/46 119/66 108/70  Pulse: 86 86 86 89  Resp:   16 18  Temp:   (!) 97.1 F (36.2 C)   TempSrc:   Temporal   SpO2:   98% 99%  Weight:   96 kg   Height:       Body mass index is  31.25 kg/m.  Physical Exam Constitutional:      General: He is not in acute distress.    Appearance: He is normal weight. He is not toxic-appearing.  HENT:     Head: Normocephalic and atraumatic.     Right Ear: External ear normal.     Left Ear: External ear normal.     Nose: No congestion or rhinorrhea.     Mouth/Throat:     Mouth: Mucous membranes are moist.     Pharynx: Oropharynx is clear.  Eyes:     Extraocular Movements: Extraocular movements intact.     Conjunctiva/sclera: Conjunctivae normal.     Pupils: Pupils are equal, round, and  reactive to light.  Cardiovascular:     Rate and Rhythm: Normal rate and regular rhythm.     Heart sounds: No murmur heard.   No friction rub. No gallop.  Pulmonary:     Effort: Pulmonary effort is normal.     Breath sounds: Normal breath sounds.  Abdominal:     General: Abdomen is flat. Bowel sounds are normal.     Palpations: Abdomen is soft.  Musculoskeletal:        General: No swelling.     Cervical back: Normal range of motion and neck supple.     Comments: Rstump site with less erythema SP Left BKA  Skin:    General: Skin is warm and dry.  Neurological:     General: No focal deficit present.     Mental Status: He is oriented to person, place, and time.  Psychiatric:        Mood and Affect: Mood normal.      Lab Results Lab Results  Component Value Date   WBC 10.5 02/17/2021   HGB 9.3 (L) 02/17/2021   HCT 29.1 (L) 02/17/2021   MCV 106.2 (H) 02/17/2021   PLT 235 02/17/2021    Lab Results  Component Value Date   CREATININE 5.82 (H) 02/17/2021   BUN 36 (H) 02/17/2021   NA 131 (L) 02/17/2021   K 5.3 (H) 02/17/2021   CL 93 (L) 02/17/2021   CO2 25 02/17/2021    Lab Results  Component Value Date   ALT 12 02/12/2021   AST 17 02/12/2021   ALKPHOS 83 02/12/2021   BILITOT 1.0 02/12/2021        Laurice Record, [facility] 02/17/2021, 3:24 PM

## 2021-02-17 NOTE — Progress Notes (Signed)
°  Progress Note    02/17/2021 7:31 AM 1 Day Post-Op  Subjective:  says some pain comes and goes.  Afebrile  Vitals:   02/16/21 2113 02/17/21 0526  BP: (!) 109/46 (!) 102/51  Pulse: 100 87  Resp: 18 18  Temp: 98.2 F (36.8 C) 98 F (36.7 C)  SpO2: 94% 97%    Physical Exam: Incisions:  left stump with ace wrap and Ioban wrap   CBC    Component Value Date/Time   WBC 10.5 02/17/2021 0220   RBC 2.74 (L) 02/17/2021 0220   HGB 9.3 (L) 02/17/2021 0220   HGB 10.4 (L) 06/02/2016 1107   HCT 29.1 (L) 02/17/2021 0220   HCT 32.9 (L) 06/02/2016 1107   PLT 235 02/17/2021 0220   PLT 389 (H) 06/02/2016 1107   MCV 106.2 (H) 02/17/2021 0220   MCV 94 06/02/2016 1107   MCH 33.9 02/17/2021 0220   MCHC 32.0 02/17/2021 0220   RDW 18.5 (H) 02/17/2021 0220   RDW 16.0 (H) 06/02/2016 1107   LYMPHSABS 0.5 (L) 02/17/2021 0220   LYMPHSABS 2.9 06/02/2016 1107   MONOABS 0.2 02/17/2021 0220   EOSABS 0.0 02/17/2021 0220   EOSABS 0.5 (H) 06/02/2016 1107   BASOSABS 0.0 02/17/2021 0220   BASOSABS 0.1 06/02/2016 1107    BMET    Component Value Date/Time   NA 131 (L) 02/17/2021 0220   NA 142 12/16/2015 1051   K 5.3 (H) 02/17/2021 0220   CL 93 (L) 02/17/2021 0220   CO2 25 02/17/2021 0220   GLUCOSE 127 (H) 02/17/2021 0220   BUN 36 (H) 02/17/2021 0220   BUN 45 (H) 12/16/2015 1051   CREATININE 5.82 (H) 02/17/2021 0220   CALCIUM 9.3 02/17/2021 0220   GFRNONAA 11 (L) 02/17/2021 0220   GFRAA 9 (L) 09/27/2019 0951    INR    Component Value Date/Time   INR 1.4 (H) 02/13/2021 0304     Intake/Output Summary (Last 24 hours) at 02/17/2021 0731 Last data filed at 02/17/2021 0526 Gross per 24 hour  Intake 960.28 ml  Output 200 ml  Net 760.28 ml     Assessment/Plan:  51 y.o. male is s/p left below knee amputation  1 Day Post-Op  -ace wrap came off last night and was replaced. -Ioban in place-will take dressing off tomorrow.   -pt will f/u in 4 weeks in our office to have staples  removed.  Our office will arrange appt.    Leontine Locket, PA-C [facility] [phone] 02/17/2021 7:31 AM

## 2021-02-17 NOTE — Progress Notes (Signed)
Inpatient Rehabilitation Admissions Coordinator   I met at bedside with patient for rehab assessment. He adamantly does not want rehab prior to return home . I attempted to explain hospital rehab, not SNF. He wants discharge Home. I will follow from a distance in case his preference does not work out.  Danne Baxter, RN, MSN Rehab Admissions Coordinator [phone] 02/17/2021 2:38 PM

## 2021-02-17 NOTE — Progress Notes (Signed)
PROGRESS NOTE    Brendan Campbell  ZSW:[id number] DOB: [birth year] DOA: 02/12/2021 PCP: Monico Blitz, MD   Brief Narrative/Hospital Course:  Brendan Campbell, 51 y.o. male with PMH of sCHF, ESRD on HD, DMII, HLD, PVD, neuropathy, PTSD who presented to [facility] with worsening weakness/fatigue. He had recent MRSA bacteremia and left leg cellulitis and had been on 6 weeks of vancomycin with HD. Patient has missed some doses due to missing HD presented with worsening of left foot osteomyelitis and had dry gangrene on admission; he has agreed for left BKA to be performed on 02/16/2021. ID was reconsulted on Monday.His right stump also looks rough and ID ordered MRI started Rocephin to broaden some coverage.  Patient underwent left BKA 1/10  Subjective: Seen and examined this morning in dialysis. Reports he Minardi will trial 4 hours of dialysis but with pain medication he agreed to stay Overnight no fever, this morning  Labs with potassium on higher side 5.3 hemoglobin 9.3 Some oozing from his right stump  Assessment & Plan:  Extensive left foot wound with significant tissue loss LLE dry gangrenous wound: vascular surgery following, ABI left with noncompressible left lower extremity arteries, continue IV antibiotics, patient is s/p left BKA  1/10-history of chemo last night and was replaced, dressing takedown tomorrow, will need follow-up in 4 weeks to have his staples removed.  Recent MRSA bacteremia Rt Stump cellulitis/ rt stump edema: Patient was on 6 weeks of IV vancomycin with HD due to aVF, had negative TTE 12/31st.Repeat blood cultures 1 week after antibiotic completion, he missed a dose on 1 and 2 and 1 and 4, follow-up ID recommendation, continue vancomycin plus ceftriaxone as per ID.ID on board, blood cultures from 1/6 negative so far.MRI of the right stump 1/10-no evidence of osteomyelitis or septic arthritis had generalized edema, skin thickening and subcutaneous soft tissue edema  consistent with diffuse cellulitis per MRI but on exam appears more of fluid in bka stumps- nephro planning for extra dialysis tomorrow to get rid of the fluid, discussed Dr Brendan Cagey advised to continue current care antibiotics fluid removal .ID following closely-continue on antibiotics with ceftriaxone/vancomycin for now.   Hyperkalemia: Due to missed dialysis. For HD today ESRD on HD MWF- cont per nephro on MWF dialysis managed by nephrology Metabolic bone disease: Monitor calcium and Phos continue PhosLo Anemia of chronic renal disease monitor hemoglobin stable-and transfuse 7 g. Recent Labs  Lab 02/15/21 0558 02/15/21 0924 02/15/21 2231 02/16/21 0958 02/17/21 0220  HGB 9.5* 9.1* 9.3* 12.6* 9.3*  HCT 28.7* 28.7* 29.0* 37.0* 29.1*    PVD History of right BKA: Has some skin abrasion and scabs, supportive care.  Continue aspirin Lipitor, now off heparin drip because of as per Vascular.   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  Chronic A. fib on Eliquis at home on hold.I discussed with vascular and likel alert y can resume Eliquis tomorrow after dressing removal.Cont coreg.  Rash diffuse rash papules on back and scattered likely due to staph colonization, outpatient referral and continue symptomatic management.  HLD continue Lipitor.  T2DM: Last A1c 5.5, diet controlled, monitor CBG Recent Labs  Lab 02/12/21 2214 02/16/21 0732 02/16/21 1321 02/17/21 0646  GLUCAP 101* 104* 91 124*    Class I Obesity:Body mass index is 32.56 kg/m. will benefit with PCP follow-up, weight loss  healthy lifestyle and outpatient sleep evaluation.  DVT prophylaxis: TED hose Start: 02/12/21 1234 SCDs Start: 02/12/21  1234 Code Status:   Code Status: Full Code Family Communication: plan of care discussed with patient at bedside. Status is: Inpatient Remains inpatient appropriate because: Management of left foot  wound and for BKA Disposition: Currently not medically stable for discharge. Anticipated Disposition: He has declined to go to skilled nursing facility.  Would like to go home with home health.  He reports he has a friend and his Campbell who is here 24/7 and has a bedside commode will wheelchair and required equipments.   Total time spent in the care of this patient  35 MINUTES Objective: Vitals last 24 hrs: Vitals:   02/17/21 0830 02/17/21 0900 02/17/21 0930 02/17/21 1000  BP: 128/72 134/71 128/62 130/72  Pulse: 89 87 86 88  Resp:      Temp:      TempSrc:      SpO2:      Weight:      Height:       Weight change:   Intake/Output Summary (Last 24 hours) at 02/17/2021 1032 Last data filed at 02/17/2021 0526 Gross per 24 hour  Intake 960.28 ml  Output 200 ml  Net 760.28 ml   Net IO Since Admission: 980.49 mL [02/17/21 1032]   Physical Examination: General exam: AAOx 3, obese,older than stated age, weak appearing. HEENT:Oral mucosa moist, Ear/Nose WNL grossly, dentition normal. Respiratory system: bilaterally clear, no use of accessory muscle Cardiovascular system: S1 & S2 +, No JVD,. Gastrointestinal system: Abdomen soft, NT,ND, BS+ Nervous System:Alert, awake, moving extremities and grossly nonfocal Extremities: Right BKA mildly tender mild erythema, significant edema present, new left BKA with dressing in place did not remove.  Skin: No rashes,no icterus. MSK: Normal muscle bulk,tone, power  AV graft on left upper extremity   Medications reviewed:  Scheduled Meds:  aspirin  81 mg Oral Daily   atorvastatin  10 mg Oral Q M,W,F-1800   calcium acetate  667 mg Oral TID WC   calcium carbonate  800 mg of elemental calcium Oral BID   carvedilol  3.125 mg Oral BID WC   cephALEXin  500 mg Oral Q12H   cholecalciferol  1,000 Units Oral Q M,W,F   darbepoetin (ARANESP) injection - DIALYSIS  40 mcg Intravenous Q Mon-HD   mupirocin ointment  1 application Nasal BID   pantoprazole  20 mg  Oral Daily   sodium chloride flush  3 mL Intravenous Q12H   sodium chloride flush  3 mL Intravenous Q12H   Continuous Infusions:  sodium chloride     sodium chloride     sodium chloride     vancomycin Stopped (02/15/21 1205)   Diet Order             Diet renal/carb modified with fluid restriction Diet-HS Snack? Nothing; Fluid restriction: 1200 mL Fluid; Room service appropriate? Yes; Fluid consistency: Thin  Diet effective now                          Weight change:   Wt Readings from Last 3 Encounters:  02/17/21 100 kg  02/05/21 93.4 kg  12/07/20 90.7 kg     Consultants:see note  Procedures:see note Antimicrobials: Anti-infectives (From admission, onward)    Start     Dose/Rate Route Frequency Ordered Stop   02/17/21 1045  cephALEXin (KEFLEX) capsule 500 mg        500 mg Oral Every 12 hours 02/17/21 1030     02/15/21 1100  cefTRIAXone (ROCEPHIN) 2 g  in sodium chloride 0.9 % 100 mL IVPB  Status:  Discontinued        2 g 200 mL/hr over 30 Minutes Intravenous Every 24 hours 02/15/21 1042 02/17/21 1030   02/12/21 1200  vancomycin (VANCOCIN) IVPB 1000 mg/200 mL premix        1,000 mg 200 mL/hr over 60 Minutes Intravenous Every M-W-F (Hemodialysis) 02/12/21 1117        Culture/Microbiology    Component Value Date/Time   SDES BLOOD RIGHT FOREARM 02/12/2021 1055   SPECREQUEST  02/12/2021 1055    BOTTLES DRAWN AEROBIC AND ANAEROBIC Blood Culture adequate volume   CULT  02/12/2021 1055    NO GROWTH 5 DAYS Performed at [facility], [address] [place], [place] [address]    REPTSTATUS 02/17/2021 FINAL 02/12/2021 1055    Other culture-see note  Unresulted Labs (From admission, onward)     Start     Ordered   02/17/21 1424  CBC  Once,   R        02/17/21 0920   02/17/21 0921  Renal function panel  Tomorrow morning,   R        02/17/21 0920   02/16/21 0500  Phosphorus  Daily,   R      02/15/21 1429   02/14/21 0500  CBC with Differential/Platelet  Daily,    R      02/13/21 1504   02/14/21 0500  Magnesium  Daily,   R      02/13/21 1504   Signed and Held  Hepatitis B surface antigen  (New Admission Hemo Labs (Hepatitis B))  Once,   R        Signed and Held   Signed and Held  Hepatitis B surface antibody  (New Admission Hemo Labs (Hepatitis B))  Once,   R        Signed and Held   Signed and Held  Hepatitis B surface antibody,quantitative  (New Admission Hemo Labs (Hepatitis B))  Once,   R        Signed and Held   Signed and Held  Renal function panel  Once,   R        Signed and Held   Signed and Held  CBC  Once,   R        Signed and Held          Data Reviewed: I have personally reviewed following labs and imaging studies CBC: Recent Labs  Lab 02/12/21 1005 02/13/21 0304 02/14/21 0821 02/15/21 0558 02/15/21 0924 02/15/21 2231 02/16/21 0958 02/17/21 0220  WBC 8.3   < > 11.1* 12.8* 12.6* 10.4  --  10.5  NEUTROABS 5.7  --  8.4* 9.9*  --  7.8*  --  9.8*  HGB 11.3*   < > 9.9* 9.5* 9.1* 9.3* 12.6* 9.3*  HCT 35.2*   < > 29.9* 28.7* 28.7* 29.0* 37.0* 29.1*  MCV 108.3*   < > 106.0* 104.7* 105.9* 105.5*  --  106.2*  PLT 233   < > 231 233 230 204  --  235   < > = values in this interval not displayed.   Basic Metabolic Panel: Recent Labs  Lab 02/13/21 1209 02/13/21 1424 02/14/21 0821 02/15/21 0558 02/15/21 0923 02/15/21 2231 02/16/21 0958 02/17/21 0220  NA  --    < > 132* 134* 132* 133* 133* 131*  K  --    < > 5.9* 5.3* 5.3* 4.5 4.3 5.3*  CL  --    < >  95* 93* 89* 95* 95* 93*  CO2  --    < > 21* 25 25 26   --  25  GLUCOSE  --    < > 113* 82 90 86 87 127*  BUN  --    < > 45* 47* 49* 24* 30* 36*  CREATININE  --    < > 6.58* 7.43* 7.41* 4.61* 5.30* 5.82*  CALCIUM  --    < > 9.2 9.7 10.0 8.5*  --  9.3  MG 2.1  --  2.0 2.2  --  1.9  --  2.3  PHOS 5.8*  --   --   --  5.6* 3.9  --  5.4*   < > = values in this interval not displayed.   GFR: Estimated Creatinine Clearance: 17.9 mL/min (A) (by C-G formula based on SCr of 5.82  mg/dL (H)). Liver Function Tests: Recent Labs  Lab 02/12/21 1005 02/15/21 0923  AST 17  --   ALT 12  --   ALKPHOS 83  --   BILITOT 1.0  --   PROT 7.1  --   ALBUMIN 3.3* 2.7*   No results for input(s): LIPASE, AMYLASE in the last 168 hours. No results for input(s): AMMONIA in the last 168 hours. Coagulation Profile: Recent Labs  Lab 02/12/21 1005 02/13/21 0304  INR 1.4* 1.4*   Cardiac Enzymes: No results for input(s): CKTOTAL, CKMB, CKMBINDEX, TROPONINI in the last 168 hours. BNP (last 3 results) No results for input(s): PROBNP in the last 8760 hours. HbA1C: No results for input(s): HGBA1C in the last 72 hours. CBG: Recent Labs  Lab 02/12/21 2214 02/16/21 0732 02/16/21 1321 02/17/21 0646  GLUCAP 101* 104* 91 124*   Lipid Profile: No results for input(s): CHOL, HDL, LDLCALC, TRIG, CHOLHDL, LDLDIRECT in the last 72 hours. Thyroid Function Tests: No results for input(s): TSH, T4TOTAL, FREET4, T3FREE, THYROIDAB in the last 72 hours. Anemia Panel: Recent Labs    02/15/21 0558  FERRITIN 860*  TIBC 192*  IRON 41*   Sepsis Labs: Recent Labs  Lab 02/12/21 1006 02/12/21 1234  LATICACIDVEN 2.3* 2.7*    Recent Results (from the past 240 hour(s))  Resp Panel by RT-PCR (Flu A&B, Covid) Nasopharyngeal Swab     Status: None   Collection Time: 02/12/21  9:48 AM   Specimen: Nasopharyngeal Swab; Nasopharyngeal(NP) swabs in vial transport medium  Result Value Ref Range Status   SARS Coronavirus 2 by RT PCR NEGATIVE NEGATIVE Final    Comment: (NOTE) SARS-CoV-2 target nucleic acids are NOT DETECTED.  The SARS-CoV-2 RNA is generally detectable in upper respiratory specimens during the acute phase of infection. The lowest concentration of SARS-CoV-2 viral copies this assay can detect is 138 copies/mL. A negative result does not preclude SARS-Cov-2 infection and should not be used as the sole basis for treatment or other patient management decisions. A negative result may  occur with  improper specimen collection/handling, submission of specimen other than nasopharyngeal swab, presence of viral mutation(s) within the areas targeted by this assay, and inadequate number of viral copies(<138 copies/mL). A negative result must be combined with clinical observations, patient history, and epidemiological information. The expected result is Negative.  Fact Sheet for Patients:  EntrepreneurPulse.com.au  Fact Sheet for Healthcare Providers:  IncredibleEmployment.be  This test is no t yet approved or cleared by the [place] FDA and  has been authorized for detection and/or diagnosis of SARS-CoV-2 by FDA under an Emergency Use Authorization (EUA). This EUA will remain  in effect (meaning this test can be used) for the duration of the COVID-19 declaration under Section 564(b)(1) of the Act, 21 U.S.C.section 360bbb-3(b)(1), unless the authorization is terminated  or revoked sooner.       Influenza A by PCR NEGATIVE NEGATIVE Final   Influenza B by PCR NEGATIVE NEGATIVE Final    Comment: (NOTE) The Xpert Xpress SARS-CoV-2/FLU/RSV plus assay is intended as an aid in the diagnosis of influenza from Nasopharyngeal swab specimens and should not be used as a sole basis for treatment. Nasal washings and aspirates are unacceptable for Xpert Xpress SARS-CoV-2/FLU/RSV testing.  Fact Sheet for Patients: EntrepreneurPulse.com.au  Fact Sheet for Healthcare Providers: IncredibleEmployment.be  This test is not yet approved or cleared by the [place] FDA and has been authorized for detection and/or diagnosis of SARS-CoV-2 by FDA under an Emergency Use Authorization (EUA). This EUA will remain in effect (meaning this test can be used) for the duration of the COVID-19 declaration under Section 564(b)(1) of the Act, 21 U.S.C. section 360bbb-3(b)(1), unless the authorization is terminated  or revoked.  Performed at [facility], [address], [place], [place] [address]   Blood culture (routine x 2)     Status: None   Collection Time: 02/12/21 10:05 AM   Specimen: Right Antecubital; Blood  Result Value Ref Range Status   Specimen Description RIGHT ANTECUBITAL  Final   Special Requests   Final    BOTTLES DRAWN AEROBIC AND ANAEROBIC Blood Culture adequate volume   Culture   Final    NO GROWTH 5 DAYS Performed at [facility], [address], [place], [place] [address]    Report Status 02/17/2021 FINAL  Final  Blood culture (routine x 2)     Status: None   Collection Time: 02/12/21 10:55 AM   Specimen: BLOOD RIGHT FOREARM  Result Value Ref Range Status   Specimen Description BLOOD RIGHT FOREARM  Final   Special Requests   Final    BOTTLES DRAWN AEROBIC AND ANAEROBIC Blood Culture adequate volume   Culture   Final    NO GROWTH 5 DAYS Performed at [facility], [address] [place], [place] [address]    Report Status 02/17/2021 FINAL  Final  Surgical pcr screen     Status: Abnormal   Collection Time: 02/16/21  5:46 AM   Specimen: Nasal Mucosa; Nasal Swab  Result Value Ref Range Status   MRSA, PCR POSITIVE (A) NEGATIVE Final    Comment: RESULT CALLED TO, READ BACK BY AND VERIFIED WITH: RN T BROWN [id number] AT 39 AM BY CM    Staphylococcus aureus POSITIVE (A) NEGATIVE Final    Comment: (NOTE) The Xpert SA Assay (FDA approved for NASAL specimens in patients 56 years of age and older), is one component of a comprehensive surveillance program. It is not intended to diagnose infection nor to guide or monitor treatment. Performed at [facility], [place] [address] [place], [place] [address]      Radiology Studies: MR [facility] RIGHT WO CONTRAST  Result Date: 02/16/2021 CLINICAL DATA:  Bacteremia. EXAM: MRI OF THE RIGHT FEMUR WITHOUT CONTRAST TECHNIQUE: Multiplanar, multisequence MR imaging of the right femur was performed. No intravenous contrast was administered.  COMPARISON:  None. FINDINGS: Bones/Joint/Cartilage Marrow signal is within normal limits. No evidence of osteomyelitis. No appreciable hip joint effusion or evidence of septic arthritis. Ligaments Intact Muscles and Tendons Marked muscle edema prominent about rectus lateral talus with trace amount of fluid superficial to the vastus lateralis. There  is also mild edema of the hamstring muscles. No intramuscular fluid collection or abscess. Soft tissues Marked skin thickening and subcutaneous soft tissue edema of bilateral lower extremities consistent with cellulitis. No drainable fluid collection or abscess. IMPRESSION: 1.  No evidence of osteomyelitis or septic arthritis. 2. Generalized edema prominent in the right vastus lateralis and about the hamstring muscles concerning for myositis. No drainable fluid collection or abscess. 3. Marked skin thickening and subcutaneous soft tissue edema consistent with diffuse cellulitis. No drainable fluid collection or abscess. Electronically Signed   By: Keane Police D.O.   On: 02/16/2021 18:58   VAS [place] ABI WITH/WO TBI  Result Date: 02/15/2021  LOWER EXTREMITY DOPPLER STUDY Patient Name:  Brendan Campbell  Date of Exam:   02/15/2021 Medical Rec #: [id number]       Accession #:    [id number] Date of Birth: July 11, 1970        Patient Gender: M Patient Age:   55 years Exam [facility]  [facility] Procedure:      VAS [place] ABI WITH/WO TBI Referring Phys: JOSHUA ROBINS --------------------------------------------------------------------------------  Indications: Left Ischemia, critical limb. High Risk Factors: Hyperlipidemia, Diabetes.  Vascular Interventions: Rt bka done. Comparison Study: no prior Performing Technologist: Archie Patten RVS  Examination Guidelines: A complete evaluation includes at minimum, Doppler waveform signals and systolic blood pressure reading at the level of bilateral brachial, anterior tibial, and posterior tibial arteries, when vessel segments are  accessible. Bilateral testing is considered an integral part of a complete examination. Photoelectric Plethysmograph (PPG) waveforms and toe systolic pressure readings are included as required and additional duplex testing as needed. Limited examinations for reoccurring indications may be performed as noted.  ABI Findings: +--------+------------------+-----+---------+--------+  Right    Rt Pressure (mmHg) Index Waveform  Comment   +--------+------------------+-----+---------+--------+  Brachial 123                      triphasic           +--------+------------------+-----+---------+--------+  PTA                                         bka       +--------+------------------+-----+---------+--------+  DP                                          bka       +--------+------------------+-----+---------+--------+ +---------+------------------+-----+--------+----------------------------+  Left      Lt Pressure (mmHg) Index Waveform Comment                       +---------+------------------+-----+--------+----------------------------+  Brachial                                    fistula                       +---------+------------------+-----+--------+----------------------------+  PTA       255                2.07  biphasic                               +---------+------------------+-----+--------+----------------------------+  DP        255                2.07  biphasic                               +---------+------------------+-----+--------+----------------------------+  Great Toe                                   great toe wound and bandaged  +---------+------------------+-----+--------+----------------------------+ +-------+-----------+-----------+------------+------------+  ABI/TBI Today's ABI Today's TBI Previous ABI Previous TBI  +-------+-----------+-----------+------------+------------+  Right   bka                                                +-------+-----------+-----------+------------+------------+   Left    2.07                                               +-------+-----------+-----------+------------+------------+   Summary: Right: Bka. 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  *See table(s) above for measurements and observations.  Electronically signed by Servando Snare MD on 02/15/2021 at 5:54:07 PM.    Final      LOS: 5 days   Antonieta Pert, MD [facility]  02/17/2021, 10:32 AM

## 2021-02-18 DIAGNOSIS — E875 Hyperkalemia: Secondary | ICD-10-CM | POA: Diagnosis not present

## 2021-02-18 LAB — CBC WITH DIFFERENTIAL/PLATELET
Abs Immature Granulocytes: 0.06 10*3/uL (ref 0.00–0.07)
Basophils Absolute: 0 10*3/uL (ref 0.0–0.1)
Basophils Relative: 0 %
Eosinophils Absolute: 0.1 10*3/uL (ref 0.0–0.5)
Eosinophils Relative: 1 %
HCT: 27.4 % — ABNORMAL LOW (ref 39.0–52.0)
Hemoglobin: 8.8 g/dL — ABNORMAL LOW (ref 13.0–17.0)
Immature Granulocytes: 1 %
Lymphocytes Relative: 9 %
Lymphs Abs: 1 10*3/uL (ref 0.7–4.0)
MCH: 34.6 pg — ABNORMAL HIGH (ref 26.0–34.0)
MCHC: 32.1 g/dL (ref 30.0–36.0)
MCV: 107.9 fL — ABNORMAL HIGH (ref 80.0–100.0)
Monocytes Absolute: 0.6 10*3/uL (ref 0.1–1.0)
Monocytes Relative: 5 %
Neutro Abs: 9.3 10*3/uL — ABNORMAL HIGH (ref 1.7–7.7)
Neutrophils Relative %: 84 %
Platelets: 215 10*3/uL (ref 150–400)
RBC: 2.54 MIL/uL — ABNORMAL LOW (ref 4.22–5.81)
RDW: 19 % — ABNORMAL HIGH (ref 11.5–15.5)
WBC: 11 10*3/uL — ABNORMAL HIGH (ref 4.0–10.5)
nRBC: 0.5 % — ABNORMAL HIGH (ref 0.0–0.2)

## 2021-02-18 LAB — PHOSPHORUS: Phosphorus: 3.8 mg/dL (ref 2.5–4.6)

## 2021-02-18 LAB — GLUCOSE, CAPILLARY: Glucose-Capillary: 99 mg/dL (ref 70–99)

## 2021-02-18 LAB — SURGICAL PATHOLOGY

## 2021-02-18 LAB — MAGNESIUM: Magnesium: 2.1 mg/dL (ref 1.7–2.4)

## 2021-02-18 MED ORDER — APIXABAN 5 MG PO TABS
5.0000 mg | ORAL_TABLET | Freq: Two times a day (BID) | ORAL | Status: DC
  Administered 2021-02-18 – 2021-02-19 (×3): 5 mg via ORAL
  Filled 2021-02-18 (×3): qty 1

## 2021-02-18 MED ORDER — VANCOMYCIN HCL IN DEXTROSE 1-5 GM/200ML-% IV SOLN
1000.0000 mg | INTRAVENOUS | Status: DC
Start: 2021-02-18 — End: 2021-02-18

## 2021-02-18 NOTE — Progress Notes (Addendum)
Physical Therapy Re-Evaluation Patient Details Name: Brendan Campbell MRN: [id number] DOB: 15-Oct-1970 Today's Date: 02/18/2021   History of Present Illness 51 y.o. male Presented 02/12/21 with with missed hemodialysis on Monday and Wednesday which he normally skips.  Also worsening left leg pain, discoloration. +gangrenous changes. 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    PMH significant of end-stage renal disease (HD MWF), CHF with ejection fraction 20-25%, recent MRSA bacteremia ( on IV vancomycin with HD), right leg AKA, PVD, DM type II, HTN, HLD, anxiety, depression, medically noncompliant    PT Comments    Pt tolerates treatment well, transferring multiple times during session. Pt demonstrates the ability to minimally scoot in the bed and requires physical assistance to laterally mobilize between surfaces. Pt will benefit from continued frequent mobilization and further transfer training to aide in reducing falls risk and caregiver burden. Pt will benefit from intensive inpatient PT services to help return to transferring independently.  Recommendations for follow up therapy are one component of a multi-disciplinary discharge planning process, led by the attending physician.  Recommendations may be updated based on patient status, additional functional criteria and insurance authorization.  Follow Up Recommendations  Acute inpatient rehab (3hours/day)     Assistance Recommended at Discharge Intermittent Supervision/Assistance  Patient can return home with the following A lot of help with walking and/or transfers;A little help with bathing/dressing/bathroom;Assistance with cooking/housework;Assist for transportation;Help with stairs or ramp for entrance   Equipment Recommendations  None recommended by PT    Recommendations for Other Services Rehab consult     Precautions / Restrictions Precautions Precautions: Fall Restrictions Weight Bearing Restrictions: Yes LLE Weight Bearing: Non weight  bearing Other Position/Activity Restrictions: old R BKA, drainage from R residual limb noted during session     Mobility  Bed Mobility Overal bed mobility: Needs Assistance Bed Mobility: Supine to Sit;Sit to Supine;Rolling Rolling: Supervision   Supine to sit: Supervision Sit to supine: Supervision   General bed mobility comments: use of bed railings for all mobility    Transfers Overall transfer level: Needs assistance Equipment used: None Transfers: Bed to chair/wheelchair/BSC            Lateral/Scoot Transfers: Mod assist General transfer comment: Pt transfers from bed to recliner and back. pt requires assistance to slide hips. Pt is able to offload buttocks with pushing attempts but unable to significantly scoot laterally. PT assists with bed pad. Verbal cues provided for trunk flexion    Ambulation/Gait                   Stairs             Wheelchair Mobility    Modified Rankin (Stroke Patients Only)       Balance Overall balance assessment: Needs assistance Sitting-balance support: Single extremity supported;Bilateral upper extremity supported Sitting balance-Leahy Scale: Poor Sitting balance - Comments: reliant on UE support when sitting up in bed Postural control: Posterior lean                                  Cognition Arousal/Alertness: Awake/alert Behavior During Therapy: WFL for tasks assessed/performed Overall Cognitive Status: No family/caregiver present to determine baseline cognitive functioning                                 General Comments: pt with poor awareness of how his  current deficits will impact his ability to safely mobilize in the home        Exercises      General Comments General comments (skin integrity, edema, etc.): VSS on RA, clear drainage noted from R residual limb. Pt with L limb protector off upon arrival, PT assists with donning limb protector and provides reinforcement of  use of device.      Pertinent Vitals/Pain Pain Assessment: Faces Faces Pain Scale: Hurts even more Pain Location: L residual limb Pain Descriptors / Indicators: Aching Pain Intervention(s): Monitored during session    Home Living Family/patient expects to be discharged to:: Private residence                        Prior Function            PT Goals (current goals can now be found in the care plan section) Acute Rehab PT Goals Patient Stated Goal: to go home PT Goal Formulation: With patient Time For Goal Achievement: 03/04/21 Potential to Achieve Goals: Good Additional Goals Additional Goal #1: Pt will mobilize in manual wheelchair for 150' at a modI level to demonstrate the ability to navigate around the home. Progress towards PT goals: Goals downgraded-see care plan    Frequency    Min 3X/week      PT Plan Current plan remains appropriate    Co-evaluation              AM-PAC PT "6 Clicks" Mobility   Outcome Measure  Help needed turning from your back to your side while in a flat bed without using bedrails?: A Little Help needed moving from lying on your back to sitting on the side of a flat bed without using bedrails?: A Little Help needed moving to and from a bed to a chair (including a wheelchair)?: A Lot Help needed standing up from a chair using your arms (e.g., wheelchair or bedside chair)?: Total Help needed to walk in hospital room?: Total Help needed climbing 3-5 steps with a railing? : Total 6 Click Score: 11    End of Session Equipment Utilized During Treatment: Other (comment) (limb protector) Activity Tolerance: Patient tolerated treatment well Patient left: in bed;with call bell/phone within reach;with bed alarm set Nurse Communication: Mobility status PT Visit Diagnosis: Difficulty in walking, not elsewhere classified (R26.2)     Time: 7001-7494 PT Time Calculation (min) (ACUTE ONLY): 38 min  Charges: 1 $Re-eval   $Therapeutic Activity: 23-37 mins                     Zenaida Niece, PT, DPT Acute Rehabilitation Pager: [phone] Office [phone]    Zenaida Niece 02/18/2021, 12:46 PM

## 2021-02-18 NOTE — Progress Notes (Signed)
PROGRESS NOTE    Brendan Campbell  KDX:[id number] DOB: 05/08/1970 DOA: 02/12/2021 PCP: Monico Blitz, MD   Brief Narrative/Hospital Course:  Brendan Campbell, 51 y.o. male with PMH of sCHF, ESRD on HD, DMII, HLD, PVD, neuropathy, PTSD who presented to [facility] with worsening weakness/fatigue. He had recent MRSA bacteremia and left leg cellulitis and had been on 6 weeks of vancomycin with HD. Patient has missed some doses due to missing HD presented with worsening of left foot osteomyelitis and had dry gangrene on admission; he has agreed for left BKA to be performed on 02/16/2021. ID was reconsulted on Monday.His right stump also looks rough and ID ordered MRI started Rocephin to broaden some coverage. Patient underwent left BKA 1/10.  Underwent MRI of the right below-knee stump that showed cellulitis/fluid no osteomyelitis, ID advised to complete the course of his vancomycin through 03/15/2021 and Keflex through 03/08/2021 for cellulitis, blood culture 1/6 has remained negative although he had missed few days of vancomycin as outpatient, TTE 12/31 no vegetation. He has adamantly denied CIR SNF and wants to return home with home health.  Subjective: Seen and examined this morning. Left BKA dressing removed by vascular-staple intact with viable flaps, no appreciable fluid collection and dressing reapplied with Ampushield placed on left leg Overnight no fever Labs this morning hemoglobin 8.8 g.  Assessment & Plan:  Extensive left foot wound with significant tissue loss LLE dry gangrenous wound: ABI left with noncompressible left lower extremity arteries,s/p left BKA  1/10.  Vascular following-Left BKA dressing removed by vascular-staple intact with viable flaps, no appreciable fluid collection and dressing reapplied with Ampushield placed on left leg, will need follow-up in 4 weeks to have his staples removed.  He is on vancomycin and Keflex see below.  Can transition to simple gauze and stump sock  tomorrow needs to wear his stump protector at all times.  Recent MRSA bacteremia secondary to LLE cellulitis RLE cellulitis/ rt stump edema: blood culture 1/6 has remained negative although he had missed few days of vancomycin as outpatient, TTE 12/31 no vegetation.  MRI of the right below-knee stump that showed cellulitis/fluid no osteomyelitis, ID advised to complete the course of his vancomycin through 03/15/2021 and Keflex through 03/08/2021.  Fluid removal per HD  Hyperkalemia: Due to missed dialysis.  Continue HD per nephrology ESRD on HD MWF-last HD 1/11 and repeat HD 1/12,managed by nephrology Metabolic bone disease: Monitor calcium and Phos continue PhosLo Anemia of chronic renal disease monitor hemoglobin stable-and transfuse 7 g. Recent Labs  Lab 02/15/21 0924 02/15/21 2231 02/16/21 0958 02/17/21 0220 02/18/21 0301  HGB 9.1* 9.3* 12.6* 9.3* 8.8*  HCT 28.7* 29.0* 37.0* 29.1* 27.4*    PVD History of right BKA, now left ZJQ:BHALPFXT aspirin Lipitor,.  Acute on chronic combined systolic and diastolic KWI:OXBDZH status being managed with dialysis, continue aspirin statin Coreg.  Chronic A. fib on Eliquis okay to resume Eliquis per vascular today.  Continue Coreg.  Continue to monitor.    Rash diffuse rash papules on back and scattered likely due to staph colonization, outpatient referral and continue symptomatic management.  HLD continue Lipitor.  T2DM: Last A1c 5.5, diet controlled, monitor CBG Recent Labs  Lab 02/12/21 2214 02/16/21 0732 02/16/21 1321 02/17/21 0646 02/18/21 0556  GLUCAP 101* 104* 91 124* 99    Class I Obesity:Body mass index is 32.39 kg/m. will benefit with PCP follow-up, weight loss  healthy lifestyle and outpatient sleep evaluation.  DVT prophylaxis: TED hose Start: 02/12/21 1234  SCDs Start: 02/12/21 1234 Code Status:   Code Status: Full Code Family Communication: plan of care discussed with patient at bedside. Status is: Inpatient Remains  inpatient appropriate because: Management of left foot wound and for BKA Disposition: Currently not medically stable for discharge. Anticipated Disposition: He has declined to go to skilled nursing facility or CIR. He would like to go home with home health.  He reports he has a friend and his Campbell who is here 24/7 and has a bedside commode will wheelchair and required equipments.   Total time spent in the care of this patient  35 MINUTES Objective: Vitals last 24 hrs: Vitals:   02/17/21 1816 02/17/21 2132 02/18/21 0505 02/18/21 0951  BP: 126/79 (!) 91/55 103/77 111/73  Pulse: 92 82 91 81  Resp: 18 18 17 20   Temp:  (!) 97.5 F (36.4 C) 98.1 F (36.7 C) 97.8 F (36.6 C)  TempSrc:   Oral Oral  SpO2: 100% 94% 95% 98%  Weight:   99.5 kg   Height:       Weight change:   Intake/Output Summary (Last 24 hours) at 02/18/2021 1116 Last data filed at 02/18/2021 0843 Gross per 24 hour  Intake 834 ml  Output 4000 ml  Net -3166 ml   Net IO Since Admission: -2,185.51 mL [02/18/21 1116]   Physical Examination: General exam: AAOx 3, older than stated age, weak appearing. HEENT:Oral mucosa moist, Ear/Nose WNL grossly, dentition normal. Respiratory system: bilaterally clear, no use of accessory muscle Cardiovascular system: S1 & S2 +, No JVD,. Gastrointestinal system: Abdomen soft, NT,ND, BS+ Nervous System:Alert, awake, moving extremities and grossly nonfocal Extremities: Left BKA with dressing in place and do not remove right BKA mild erythema and edema+ Skin: No rashes,no icterus. MSK: Normal muscle bulk,tone, power  AV graft on left upper extremity   Medications reviewed:  Scheduled Meds:  apixaban  5 mg Oral BID   aspirin  81 mg Oral Daily   atorvastatin  10 mg Oral Q M,W,F-1800   calcium acetate  667 mg Oral TID WC   calcium carbonate  800 mg of elemental calcium Oral BID   carvedilol  3.125 mg Oral BID WC   cephALEXin  500 mg Oral Q12H   cholecalciferol  1,000 Units Oral Q M,W,F    darbepoetin (ARANESP) injection - DIALYSIS  40 mcg Intravenous Q Mon-HD   mupirocin ointment  1 application Nasal BID   pantoprazole  20 mg Oral Daily   sodium chloride flush  3 mL Intravenous Q12H   sodium chloride flush  3 mL Intravenous Q12H   Continuous Infusions:  sodium chloride     vancomycin Stopped (02/17/21 1230)   Diet Order             Diet renal/carb modified with fluid restriction Diet-HS Snack? Nothing; Fluid restriction: 1200 mL Fluid; Room service appropriate? Yes; Fluid consistency: Thin  Diet effective now                   Weight change:   Wt Readings from Last 3 Encounters:  02/18/21 99.5 kg  02/05/21 93.4 kg  12/07/20 90.7 kg     Consultants:see note  Procedures:see note Antimicrobials: Anti-infectives (From admission, onward)    Start     Dose/Rate Route Frequency Ordered Stop   02/17/21 1045  cephALEXin (KEFLEX) capsule 500 mg        500 mg Oral Every 12 hours 02/17/21 1030 03/08/21 2359   02/15/21 1100  cefTRIAXone (ROCEPHIN)  2 g in sodium chloride 0.9 % 100 mL IVPB  Status:  Discontinued        2 g 200 mL/hr over 30 Minutes Intravenous Every 24 hours 02/15/21 1042 02/17/21 1030   02/12/21 1200  vancomycin (VANCOCIN) IVPB 1000 mg/200 mL premix        1,000 mg 200 mL/hr over 60 Minutes Intravenous Every M-W-F (Hemodialysis) 02/12/21 1117 03/15/21 2359      Culture/Microbiology    Component Value Date/Time   SDES BLOOD RIGHT FOREARM 02/12/2021 1055   SPECREQUEST  02/12/2021 1055    BOTTLES DRAWN AEROBIC AND ANAEROBIC Blood Culture adequate volume   CULT  02/12/2021 1055    NO GROWTH 5 DAYS Performed at [facility], [address], [place], [place] [address]    REPTSTATUS 02/17/2021 FINAL 02/12/2021 1055    Other culture-see note  Unresulted Labs (From admission, onward)     Start     Ordered   02/19/21 2633  Basic metabolic panel  Tomorrow morning,   R        02/18/21 1116   02/19/21 0500  CBC  Tomorrow morning,   R         02/18/21 1116   Signed and Held  Hepatitis B surface antigen  (New Admission Hemo Labs (Hepatitis B))  Once,   R        Signed and Held   Signed and Held  Hepatitis B surface antibody  (New Admission Hemo Labs (Hepatitis B))  Once,   R        Signed and Held   Signed and Held  Hepatitis B surface antibody,quantitative  (New Admission Hemo Labs (Hepatitis B))  Once,   R        Signed and Held   Signed and Held  Renal function panel  Once,   R        Signed and Held   Signed and Held  CBC  Once,   R        Signed and Held          Data Reviewed: I have personally reviewed following labs and imaging studies CBC: Recent Labs  Lab 02/14/21 0821 02/15/21 0558 02/15/21 0924 02/15/21 2231 02/16/21 0958 02/17/21 0220 02/18/21 0301  WBC 11.1* 12.8* 12.6* 10.4  --  10.5 11.0*  NEUTROABS 8.4* 9.9*  --  7.8*  --  9.8* 9.3*  HGB 9.9* 9.5* 9.1* 9.3* 12.6* 9.3* 8.8*  HCT 29.9* 28.7* 28.7* 29.0* 37.0* 29.1* 27.4*  MCV 106.0* 104.7* 105.9* 105.5*  --  106.2* 107.9*  PLT 231 233 230 204  --  235 354   Basic Metabolic Panel: Recent Labs  Lab 02/13/21 1209 02/13/21 1424 02/14/21 0821 02/15/21 0558 02/15/21 0923 02/15/21 2231 02/16/21 0958 02/17/21 0220 02/18/21 0301  NA  --    < > 132* 134* 132* 133* 133* 131*  --   K  --    < > 5.9* 5.3* 5.3* 4.5 4.3 5.3*  --   CL  --    < > 95* 93* 89* 95* 95* 93*  --   CO2  --    < > 21* 25 25 26   --  25  --   GLUCOSE  --    < > 113* 82 90 86 87 127*  --   BUN  --    < > 45* 47* 49* 24* 30* 36*  --   CREATININE  --    < > 6.58* 7.43*  7.41* 4.61* 5.30* 5.82*  --   CALCIUM  --    < > 9.2 9.7 10.0 8.5*  --  9.3  --   MG 2.1  --  2.0 2.2  --  1.9  --  2.3 2.1  PHOS 5.8*  --   --   --  5.6* 3.9  --  5.4* 3.8   < > = values in this interval not displayed.   GFR: Estimated Creatinine Clearance: 17.9 mL/min (A) (by C-G formula based on SCr of 5.82 mg/dL (H)). Liver Function Tests: Recent Labs  Lab 02/12/21 1005 02/15/21 0923  AST 17  --   ALT  12  --   ALKPHOS 83  --   BILITOT 1.0  --   PROT 7.1  --   ALBUMIN 3.3* 2.7*   No results for input(s): LIPASE, AMYLASE in the last 168 hours. No results for input(s): AMMONIA in the last 168 hours. Coagulation Profile: Recent Labs  Lab 02/12/21 1005 02/13/21 0304  INR 1.4* 1.4*   Cardiac Enzymes: No results for input(s): CKTOTAL, CKMB, CKMBINDEX, TROPONINI in the last 168 hours. BNP (last 3 results) No results for input(s): PROBNP in the last 8760 hours. HbA1C: No results for input(s): HGBA1C in the last 72 hours. CBG: Recent Labs  Lab 02/12/21 2214 02/16/21 0732 02/16/21 1321 02/17/21 0646 02/18/21 0556  GLUCAP 101* 104* 91 124* 99   Lipid Profile: No results for input(s): CHOL, HDL, LDLCALC, TRIG, CHOLHDL, LDLDIRECT in the last 72 hours. Thyroid Function Tests: No results for input(s): TSH, T4TOTAL, FREET4, T3FREE, THYROIDAB in the last 72 hours. Anemia Panel: No results for input(s): VITAMINB12, FOLATE, FERRITIN, TIBC, IRON, RETICCTPCT in the last 72 hours.  Sepsis Labs: Recent Labs  Lab 02/12/21 1006 02/12/21 1234  LATICACIDVEN 2.3* 2.7*    Recent Results (from the past 240 hour(s))  Resp Panel by RT-PCR (Flu A&B, Covid) Nasopharyngeal Swab     Status: None   Collection Time: 02/12/21  9:48 AM   Specimen: Nasopharyngeal Swab; Nasopharyngeal(NP) swabs in vial transport medium  Result Value Ref Range Status   SARS Coronavirus 2 by RT PCR NEGATIVE NEGATIVE Final    Comment: (NOTE) SARS-CoV-2 target nucleic acids are NOT DETECTED.  The SARS-CoV-2 RNA is generally detectable in upper respiratory specimens during the acute phase of infection. The lowest concentration of SARS-CoV-2 viral copies this assay can detect is 138 copies/mL. A negative result does not preclude SARS-Cov-2 infection and should not be used as the sole basis for treatment or other patient management decisions. A negative result may occur with  improper specimen collection/handling,  submission of specimen other than nasopharyngeal swab, presence of viral mutation(s) within the areas targeted by this assay, and inadequate number of viral copies(<138 copies/mL). A negative result must be combined with clinical observations, patient history, and epidemiological information. The expected result is Negative.  Fact Sheet for Patients:  EntrepreneurPulse.com.au  Fact Sheet for Healthcare Providers:  IncredibleEmployment.be  This test is no t yet approved or cleared by the [place] FDA and  has been authorized for detection and/or diagnosis of SARS-CoV-2 by FDA under an Emergency Use Authorization (EUA). This EUA will remain  in effect (meaning this test can be used) for the duration of the COVID-19 declaration under Section 564(b)(1) of the Act, 21 U.S.C.section 360bbb-3(b)(1), unless the authorization is terminated  or revoked sooner.       Influenza A by PCR NEGATIVE NEGATIVE Final   Influenza B by PCR NEGATIVE NEGATIVE  Final    Comment: (NOTE) The Xpert Xpress SARS-CoV-2/FLU/RSV plus assay is intended as an aid in the diagnosis of influenza from Nasopharyngeal swab specimens and should not be used as a sole basis for treatment. Nasal washings and aspirates are unacceptable for Xpert Xpress SARS-CoV-2/FLU/RSV testing.  Fact Sheet for Patients: EntrepreneurPulse.com.au  Fact Sheet for Healthcare Providers: IncredibleEmployment.be  This test is not yet approved or cleared by the [place] FDA and has been authorized for detection and/or diagnosis of SARS-CoV-2 by FDA under an Emergency Use Authorization (EUA). This EUA will remain in effect (meaning this test can be used) for the duration of the COVID-19 declaration under Section 564(b)(1) of the Act, 21 U.S.C. section 360bbb-3(b)(1), unless the authorization is terminated or revoked.  Performed at [facility], [address] [place], [place] [address]   Blood culture (routine x 2)     Status: None   Collection Time: 02/12/21 10:05 AM   Specimen: Right Antecubital; Blood  Result Value Ref Range Status   Specimen Description RIGHT ANTECUBITAL  Final   Special Requests   Final    BOTTLES DRAWN AEROBIC AND ANAEROBIC Blood Culture adequate volume   Culture   Final    NO GROWTH 5 DAYS Performed at [facility], [address], [place], [place] [address]    Report Status 02/17/2021 FINAL  Final  Blood culture (routine x 2)     Status: None   Collection Time: 02/12/21 10:55 AM   Specimen: BLOOD RIGHT FOREARM  Result Value Ref Range Status   Specimen Description BLOOD RIGHT FOREARM  Final   Special Requests   Final    BOTTLES DRAWN AEROBIC AND ANAEROBIC Blood Culture adequate volume   Culture   Final    NO GROWTH 5 DAYS Performed at [facility], [address], [place], [place] [address]    Report Status 02/17/2021 FINAL  Final  Surgical pcr screen     Status: Abnormal   Collection Time: 02/16/21  5:46 AM   Specimen: Nasal Mucosa; Nasal Swab  Result Value Ref Range Status   MRSA, PCR POSITIVE (A) NEGATIVE Final    Comment: RESULT CALLED TO, READ BACK BY AND VERIFIED WITH: RN T BROWN [id number] AT 59 AM BY CM    Staphylococcus aureus POSITIVE (A) NEGATIVE Final    Comment: (NOTE) The Xpert SA Assay (FDA approved for NASAL specimens in patients 56 years of age and older), is one component of a comprehensive surveillance program. It is not intended to diagnose infection nor to guide or monitor treatment. Performed at [facility], [place] [address], [place], [place] [address]      Radiology Studies: MR [facility] RIGHT WO CONTRAST  Result Date: 02/16/2021 CLINICAL DATA:  Bacteremia. EXAM: MRI OF THE RIGHT FEMUR WITHOUT CONTRAST TECHNIQUE: Multiplanar, multisequence MR imaging of the right femur was performed. No intravenous contrast was administered. COMPARISON:  None. FINDINGS: Bones/Joint/Cartilage Marrow  signal is within normal limits. No evidence of osteomyelitis. No appreciable hip joint effusion or evidence of septic arthritis. Ligaments Intact Muscles and Tendons Marked muscle edema prominent about rectus lateral talus with trace amount of fluid superficial to the vastus lateralis. There is also mild edema of the hamstring muscles. No intramuscular fluid collection or abscess. Soft tissues Marked skin thickening and subcutaneous soft tissue edema of bilateral lower extremities consistent with cellulitis. No drainable fluid collection or abscess. IMPRESSION: 1.  No evidence of osteomyelitis or septic arthritis. 2. Generalized edema prominent in the right  vastus lateralis and about the hamstring muscles concerning for myositis. No drainable fluid collection or abscess. 3. Marked skin thickening and subcutaneous soft tissue edema consistent with diffuse cellulitis. No drainable fluid collection or abscess. Electronically Signed   By: Keane Police D.O.   On: 02/16/2021 18:58     LOS: 6 days   Antonieta Pert, MD [facility]  02/18/2021, 11:16 AM

## 2021-02-18 NOTE — Progress Notes (Addendum)
°  Progress Note    02/18/2021 7:55 AM 2 Days Post-Op  Subjective:  no complaints. Some soreness in left stump   Vitals:   02/17/21 2132 02/18/21 0505  BP: (!) 91/55 103/77  Pulse: 82 91  Resp: 18 17  Temp: (!) 97.5 F (36.4 C) 98.1 F (36.7 C)  SpO2: 94% 95%   Physical Exam: Cardiac:  regular Lungs:  non labored Incisions:  left BKA dressings removed. Staples intact. Flaps viable. No appreciable fluid collections. No drainage. Dressings reapplied and Ampushield placed on left leg Extremities:  well perfused and warm Neurologic: alert and oriented  CBC    Component Value Date/Time   WBC 11.0 (H) 02/18/2021 0301   RBC 2.54 (L) 02/18/2021 0301   HGB 8.8 (L) 02/18/2021 0301   HGB 10.4 (L) 06/02/2016 1107   HCT 27.4 (L) 02/18/2021 0301   HCT 32.9 (L) 06/02/2016 1107   PLT 215 02/18/2021 0301   PLT 389 (H) 06/02/2016 1107   MCV 107.9 (H) 02/18/2021 0301   MCV 94 06/02/2016 1107   MCH 34.6 (H) 02/18/2021 0301   MCHC 32.1 02/18/2021 0301   RDW 19.0 (H) 02/18/2021 0301   RDW 16.0 (H) 06/02/2016 1107   LYMPHSABS 1.0 02/18/2021 0301   LYMPHSABS 2.9 06/02/2016 1107   MONOABS 0.6 02/18/2021 0301   EOSABS 0.1 02/18/2021 0301   EOSABS 0.5 (H) 06/02/2016 1107   BASOSABS 0.0 02/18/2021 0301   BASOSABS 0.1 06/02/2016 1107    BMET    Component Value Date/Time   NA 131 (L) 02/17/2021 0220   NA 142 12/16/2015 1051   K 5.3 (H) 02/17/2021 0220   CL 93 (L) 02/17/2021 0220   CO2 25 02/17/2021 0220   GLUCOSE 127 (H) 02/17/2021 0220   BUN 36 (H) 02/17/2021 0220   BUN 45 (H) 12/16/2015 1051   CREATININE 5.82 (H) 02/17/2021 0220   CALCIUM 9.3 02/17/2021 0220   GFRNONAA 11 (L) 02/17/2021 0220   GFRAA 9 (L) 09/27/2019 0951    INR    Component Value Date/Time   INR 1.4 (H) 02/13/2021 0304     Intake/Output Summary (Last 24 hours) at 02/18/2021 0755 Last data filed at 02/17/2021 1738 Gross per 24 hour  Intake 354 ml  Output 4000 ml  Net -3646 ml     Assessment/Plan:   51 y.o. male is s/p Left BKA 2 Days Post-Op   Left BKA well appearing. Viable flaps. Dressings changed today. Ampushield placed on leg Discussed with patient importance of keeping left knee straight Remains on Vanc and Keflex Okay to resume Eliquis today Medical management per primary team Will have follow up in our office in 4 weeks for staple removal  Marval Regal [facility] [phone] 02/18/2021 7:55 AM  VASCULAR STAFF ADDENDUM: I have independently interviewed and examined the patient. I agree with the above.  BKA healing appropriately. Daily dry dressing changes with compression.  Can transition to simple gauze and stump sock tomorrow. Needs to wear stump protector at all times.   Cassandria Santee, MD [facility] Phone Number: [phone] 02/18/2021 8:08 AM

## 2021-02-18 NOTE — Progress Notes (Signed)
Occupational Therapy Treatment Patient Details Name: JULIOCESAR BLASIUS MRN: [id number] DOB: 1970-09-02 Today's Date: 02/18/2021   History of present illness 51 y.o. male Presented 02/12/21 with with missed hemodialysis on Monday and Wednesday which he normally skips.  Also worsening left leg pain, discoloration. +gangrenous changes. 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    PMH significant of end-stage renal disease (HD MWF), CHF with ejection fraction 20-25%, recent MRSA bacteremia ( on IV vancomycin with HD), right leg AKA, PVD, DM type II, HTN, HLD, anxiety, depression, medically noncompliant   OT comments  PT making limited progress due to fatigue and pain. Pt requiring mod A to transfer from bed to recliner and back, as he was having difficulty offloading his weight from one hip to the other. Pt educated on all his deficits and amount of assist provided. Pt continues to insist that he will return home. Discharge updated to [facility] services. OT will continue to follow acutely.    Recommendations for follow up therapy are one component of a multi-disciplinary discharge planning process, led by the attending physician.  Recommendations may be updated based on patient status, additional functional criteria and insurance authorization.    Follow Up Recommendations  Home health OT (Pt refusing SNF)    Assistance Recommended at Discharge Frequent or constant Supervision/Assistance  Patient can return home with the following  A lot of help with walking and/or transfers;A lot of help with bathing/dressing/bathroom;Assist for transportation   Equipment Recommendations  BSC/3in1;Hospital bed (Drop arm BSC, sliding board)    Recommendations for Other Services      Precautions / Restrictions Precautions Precautions: Fall Restrictions Weight Bearing Restrictions: Yes LLE Weight Bearing: Non weight bearing Other Position/Activity Restrictions: old R BKA, drainage from R residual limb noted during session        Mobility Bed Mobility Overal bed mobility: Needs Assistance Bed Mobility: Supine to Sit;Sit to Supine;Rolling Rolling: Supervision   Supine to sit: Supervision Sit to supine: Supervision   General bed mobility comments: use of bed railings for all mobility    Transfers Overall transfer level: Needs assistance Equipment used: None Transfers: Bed to chair/wheelchair/BSC            Lateral/Scoot Transfers: Mod assist General transfer comment: PT with difficulty pushing himself and off loading weight from each hip to scoot to the recliner and back     Balance Overall balance assessment: Needs assistance Sitting-balance support: Single extremity supported;Bilateral upper extremity supported Sitting balance-Leahy Scale: Poor Sitting balance - Comments: reliant on UE support when sitting up in bed Postural control: Posterior lean                                 ADL either performed or assessed with clinical judgement   ADL Overall ADL's : Needs assistance/impaired                         Toilet Transfer: Moderate assistance (Lateral scoots) Toilet Transfer Details (indicate cue type and reason): simulated to recliner and back           General ADL Comments: Limited by pain and fatigue this session, as well as difficulty staying on task    Extremity/Trunk Assessment              Vision       Perception     Praxis      Cognition Arousal/Alertness: Awake/alert  Behavior During Therapy: WFL for tasks assessed/performed Overall Cognitive Status: No family/caregiver present to determine baseline cognitive functioning                                 General Comments: pt with poor awareness of how his current deficits will impact his ability to safely mobilize in the home          Exercises     Shoulder Instructions       General Comments VSS on RA, limb protector off his LLE, pt educated on importance of  keeping his knee straight for eventual prosthesis.    Pertinent Vitals/ Pain       Pain Assessment: Faces Faces Pain Scale: Hurts even more Pain Location: L residual limb Pain Descriptors / Indicators: Aching Pain Intervention(s): Monitored during session  Home Living                                          Prior Functioning/Environment              Frequency  Min 2X/week        Progress Toward Goals  OT Goals(current goals can now be found in the care plan section)  Progress towards OT goals: Progressing toward goals  Acute Rehab OT Goals Patient Stated Goal: to go home OT Goal Formulation: With patient Time For Goal Achievement: 02/27/21 Potential to Achieve Goals: Fair ADL Goals Pt Will Perform Lower Body Bathing: with supervision;sitting/lateral leans Pt Will Perform Lower Body Dressing: sitting/lateral leans;with adaptive equipment;with supervision Pt Will Transfer to Toilet: with supervision;anterior/posterior transfer Pt Will Perform Toileting - Clothing Manipulation and hygiene: with supervision;sitting/lateral leans  Plan Discharge plan needs to be updated    Co-evaluation                 AM-PAC OT "6 Clicks" Daily Activity     Outcome Measure   Help from another person eating meals?: A Little Help from another person taking care of personal grooming?: A Little Help from another person toileting, which includes using toliet, bedpan, or urinal?: A Lot Help from another person bathing (including washing, rinsing, drying)?: A Lot Help from another person to put on and taking off regular upper body clothing?: A Little Help from another person to put on and taking off regular lower body clothing?: A Lot 6 Click Score: 15    End of Session    OT Visit Diagnosis: Other abnormalities of gait and mobility (R26.89);Muscle weakness (generalized) (M62.81)   Activity Tolerance Patient limited by pain   Patient Left in bed;with call  bell/phone within reach;with bed alarm set   Nurse Communication Mobility status        Time: 8469-6295 OT Time Calculation (min): 29 min  Charges: OT General Charges $OT Visit: 1 Visit OT Treatments $Therapeutic Activity: 23-37 mins  Yuchen Fedor H., OTR/L Acute Rehabilitation  Kamyia Thomason Elane Yolanda Bonine 02/18/2021, 9:21 PM

## 2021-02-18 NOTE — TOC Initial Note (Addendum)
Transition of Care [facility]) - Initial/Assessment Note    Patient Details  Name: Brendan Campbell MRN: [id number] Date of Birth: February 12, 1970  [facility]) CM/SW Contact:    Tom-Johnson, Renea Ee, RN Phone Number: 02/18/2021, 3:49 PM  Clinical Narrative:                  CM spoke with patient at bedside about needs for post hospital transition. States he lives at home with his roommate and roommate's son. States roommate leaves for work by 3 pm and her son will assist when needed. CM spoke with patient about the importance of rehab and patient states he just wants to go home. Has a wheelchair and shower chair at home. Requested hospital bed, sliding board and rolling walker. MD notified order placed. CM spoke with Freda Munro with [employer] and equipments will be delivered to patient's address. CM made home health referral with [facility]mings and Tommi Rumps accepted. Information on AVS. Patient uses [facility] transportation to and from dialysis outpatient. Patient is active with Internal Medicine [phone]). Patient to followup at discharge. Transportation will be scheduled at discharge. CM will continue to follow with needs.   Expected Discharge Plan: [place] Barriers to Discharge: Continued Medical Work up   Patient Goals and CMS Choice Patient states their goals for this hospitalization and ongoing recovery are:: To return home CMS Medicare.gov Compare Post Acute Care list provided to:: Patient Choice offered to / list presented to : Patient  Expected Discharge Plan and Services Expected Discharge Plan: [place]   Discharge Planning Services: CM Consult Post Acute Care Choice: [place] arrangements for the past 2 months: [place]                 DME Arranged: 3-N-1, Hospital bed, Walker rolling, Other see comment (Sliding board) DME Agency: [employer] Date DME Agency Contacted: 02/17/21 Time DME Agency Contacted: [phone] Representative  spoke with at DME Agency: Freda Munro HH Arranged: PT, OT [place] Agency: [place] Date [place]: 02/18/21 Time [place]: 25 Representative spoke with at [place]: [place] Arrangements/Services Living arrangements for the past 2 months: [place] with:: Roommate Patient language and need for interpreter reviewed:: Yes Do you feel safe going back to the place where you live?: Yes      Need for Family Participation in Patient Care: Yes (Comment) Care giver support system in place?: Yes (comment) Current home services: DME (wheelchair, shower chair) Criminal Activity/Legal Involvement Pertinent to Current Situation/Hospitalization: No - Comment as needed  Activities of Daily Living      Permission Sought/Granted Permission sought to share information with : Case Manager, Customer service manager, Family Supports Permission granted to share information with : Yes, Verbal Permission Granted              Emotional Assessment Appearance:: Appears stated age Attitude/Demeanor/Rapport: Engaged, Gracious Affect (typically observed): Accepting, Appropriate, Calm, Hopeful Orientation: : Oriented to Self, Oriented to Place, Oriented to  Time, Oriented to Situation Alcohol / Substance Use: Not Applicable Psych Involvement: No (comment)  Admission diagnosis:  Hyperkalemia [E87.5] Ischemic foot [I99.8] Bilateral rales [R09.89] Patient Active Problem List   Diagnosis Date Noted   MRSA bacteremia 02/13/2021   Rash 02/13/2021   Lower extremity cellulitis 96/22/2979   Chronic systolic CHF (congestive heart failure) ([place]) 07/27/2020   Volume overload 07/20/2020   COVID-19 virus infection 07/20/2020   Acute on  chronic respiratory failure with hypoxia ([place]) 07/03/2020   Acute on chronic combined systolic and diastolic CHF (congestive heart failure) ([place]) 04/08/2020   Left hemiparesis ([place]) 04/08/2020   Lumbar spondylosis  04/08/2020   Atrial fibrillation with RVR ([place]) 04/08/2020   Pain in limb 12/29/2019   Cervical radiculopathy 12/29/2019   Acute congestive heart failure ([place]) 12/13/2019   Paresthesia 09/24/2019   Hypocalcemia    Chronic atrial fibrillation ([place])    Hyperparathyroidism ([place])    Cardiac arrest ([place]) 06/07/2019   Neuropathy    Peripheral vascular disease ([place])    ESRD on hemodialysis ([place])    Hyperkalemia 10/05/2018   Spondylosis of lumbar spine 03/08/2016   Lumbar radiculopathy 03/08/2016   S/P below knee amputation, right ([place]) 03/05/2015   Type 2 diabetes mellitus with diabetic chronic kidney disease ([place]) 03/03/2015   Hyperlipidemia 03/03/2015   Low back pain 03/03/2015   PCP:  Monico Blitz, MD Pharmacy:   [id number] - [place], [place] [address] Phone: [phone] Fax: [phone]     Social Determinants of Health (SDOH) Interventions    Readmission Risk Interventions Readmission Risk Prevention Plan 07/21/2020  Transportation Screening Complete  PCP or Specialist Appt within 3-5 Days Complete  HRI or Home Care Consult Complete  Social Work Consult for [place] Planning/Counseling Complete  Palliative Care Screening Not Applicable  Medication Review Press photographer) Complete  Some recent data might be hidden

## 2021-02-18 NOTE — Progress Notes (Signed)
[facility] Progress Note   Subjective: Seen in room. Unfortunately unable to have extra treatment D/T staffing issues. Discussed with patient. HD tomorrow on schedule. Successfully removed 4 liters on HD yesterday without issues. Tolerated well.   Objective Vitals:   02/17/21 1816 02/17/21 2132 02/18/21 0505 02/18/21 0951  BP: 126/79 (!) 91/55 103/77 111/73  Pulse: 92 82 91 81  Resp: 18 18 17 20   Temp:  (!) 97.5 F (36.4 C) 98.1 F (36.7 C) 97.8 F (36.6 C)  TempSrc:   Oral Oral  SpO2: 100% 94% 95% 98%  Weight:   99.5 kg   Height:       Physical Exam General:chronically ill appearing male in NAD Heart:RRR, no mrg Lungs:CTAB, nml WOB on RA Abdomen:obese, NABS,  NTND Extremities:R BKA- stump edema with open oozing lesion on R stump from abrasion from stretcher. He has trace R pitting stump edema, edema upper portion of thighs, hips and sacral area. L BKA with stump protector.  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 Lesions excoriated, various stages of healing. Abrasion on chest from scratching.  Dialysis Access: LU AVF +b/t  Dialysis Orders:  Additional Objective Labs: Basic Metabolic Panel: Recent Labs  Lab 02/15/21 0923 02/15/21 2231 02/16/21 0958 02/17/21 0220 02/18/21 0301  NA 132* 133* 133* 131*  --   K 5.3* 4.5 4.3 5.3*  --   CL 89* 95* 95* 93*  --   CO2 25 26  --  25  --   GLUCOSE 90 86 87 127*  --   BUN 49* 24* 30* 36*  --   CREATININE 7.41* 4.61* 5.30* 5.82*  --   CALCIUM 10.0 8.5*  --  9.3  --   PHOS 5.6* 3.9  --  5.4* 3.8   Liver Function Tests: Recent Labs  Lab 02/12/21 1005 02/15/21 0923  AST 17  --   ALT 12  --   ALKPHOS 83  --   BILITOT 1.0  --   PROT 7.1  --   ALBUMIN 3.3* 2.7*   No results for input(s): LIPASE, AMYLASE in the last 168 hours. CBC: Recent Labs  Lab 02/15/21 0558 02/15/21 0924 02/15/21 2231 02/16/21 0958 02/17/21 0220 02/18/21 0301  WBC 12.8* 12.6* 10.4  --  10.5 11.0*  NEUTROABS  9.9*  --  7.8*  --  9.8* 9.3*  HGB 9.5* 9.1* 9.3* 12.6* 9.3* 8.8*  HCT 28.7* 28.7* 29.0* 37.0* 29.1* 27.4*  MCV 104.7* 105.9* 105.5*  --  106.2* 107.9*  PLT 233 230 204  --  235 215   Blood Culture    Component Value Date/Time   SDES BLOOD RIGHT FOREARM 02/12/2021 1055   SPECREQUEST  02/12/2021 1055    BOTTLES DRAWN AEROBIC AND ANAEROBIC Blood Culture adequate volume   CULT  02/12/2021 1055    NO GROWTH 5 DAYS Performed at [facility], [address], [place], [place] [address]    REPTSTATUS 02/17/2021 FINAL 02/12/2021 1055    Cardiac Enzymes: No results for input(s): CKTOTAL, CKMB, CKMBINDEX, TROPONINI in the last 168 hours. CBG: Recent Labs  Lab 02/12/21 2214 02/16/21 0732 02/16/21 1321 02/17/21 0646 02/18/21 0556  GLUCAP 101* 104* 91 124* 99   Iron Studies: No results for input(s): IRON, TIBC, TRANSFERRIN, FERRITIN in the last 72 hours. @lablastinr3 @ Studies/Results: MR FRMUR RIGHT WO CONTRAST  Result Date: 02/16/2021 CLINICAL DATA:  Bacteremia. EXAM: MRI OF THE RIGHT FEMUR WITHOUT CONTRAST TECHNIQUE: Multiplanar, multisequence MR imaging of the right femur was performed. No  intravenous contrast was administered. COMPARISON:  None. FINDINGS: Bones/Joint/Cartilage Marrow signal is within normal limits. No evidence of osteomyelitis. No appreciable hip joint effusion or evidence of septic arthritis. Ligaments Intact Muscles and Tendons Marked muscle edema prominent about rectus lateral talus with trace amount of fluid superficial to the vastus lateralis. There is also mild edema of the hamstring muscles. No intramuscular fluid collection or abscess. Soft tissues Marked skin thickening and subcutaneous soft tissue edema of bilateral lower extremities consistent with cellulitis. No drainable fluid collection or abscess. IMPRESSION: 1.  No evidence of osteomyelitis or septic arthritis. 2. Generalized edema prominent in the right vastus lateralis and about the hamstring muscles  concerning for myositis. No drainable fluid collection or abscess. 3. Marked skin thickening and subcutaneous soft tissue edema consistent with diffuse cellulitis. No drainable fluid collection or abscess. Electronically Signed   By: Keane Police D.O.   On: 02/16/2021 18:58   Medications:  sodium chloride     vancomycin Stopped (02/17/21 1230)   vancomycin      apixaban  5 mg Oral BID   aspirin  81 mg Oral Daily   atorvastatin  10 mg Oral Q M,W,F-1800   calcium acetate  667 mg Oral TID WC   calcium carbonate  800 mg of elemental calcium Oral BID   carvedilol  3.125 mg Oral BID WC   cephALEXin  500 mg Oral Q12H   cholecalciferol  1,000 Units Oral Q M,W,F   darbepoetin (ARANESP) injection - DIALYSIS  40 mcg Intravenous Q Mon-HD   mupirocin ointment  1 application Nasal BID   pantoprazole  20 mg Oral Daily   sodium chloride flush  3 mL Intravenous Q12H   sodium chloride flush  3 mL Intravenous Q12H     OP HD: [facility] MWF  4h 13min  95 kg   1K/2.25 bath  Hep 1000+ 500u/hr  LUE AVG  300/500   - prosthetic weighs 1.6 kg  - Epogen 1000   Units IV/HD     Assessment/Plan:  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 Virl Cagey. Excoriation and open lesions R Stump. Primary aware. VVS aware. Does not want to do Rehab, plans on going home.  MRSA bacteremia - missed 1 week of IV vancomycin. Resume IV vanc. ID consulted. Added ceftriaxone. Will need to continue Vancomycin through 03/15/2021. Continue Kelfex through 03/08/2021.  ESRD - MWF. Had planned extra treatment but canceled D/T staffing issues. Next HD 02/19/2021.   Hypertension/volume  - Blood pressure at goal. HD 01/11 Net UF 4 liters, tolerated well. Will order same UF goal.   Anemia  - Hgb 8.8 on ESA. Given Aranesp 40 mcg IV 02/15/2021.   Metabolic bone disease -   Calcium in goal. PO4 at goal  Continue home meds.  Not on VDRA.   Nutrition - renal diet, carb modified with fluid restrictions.   Prurigo Nodularis-excessive pruritis, skin excoriated  from itching. Would consider [place] as OP but will add benadryl and sarna lotion for now. PO4 at goal.    Disposition: Does NOT wish to go to rehab. Wants to return home   [place]. Arnoldo Hildreth NP-C 02/18/2021, 12:30 PM  [employer] [phone]

## 2021-02-19 ENCOUNTER — Other Ambulatory Visit (HOSPITAL_COMMUNITY): Payer: Self-pay

## 2021-02-19 DIAGNOSIS — E875 Hyperkalemia: Secondary | ICD-10-CM | POA: Diagnosis not present

## 2021-02-19 LAB — BASIC METABOLIC PANEL
Anion gap: 10 (ref 5–15)
BUN: 28 mg/dL — ABNORMAL HIGH (ref 6–20)
CO2: 29 mmol/L (ref 22–32)
Calcium: 9.9 mg/dL (ref 8.9–10.3)
Chloride: 93 mmol/L — ABNORMAL LOW (ref 98–111)
Creatinine, Ser: 5 mg/dL — ABNORMAL HIGH (ref 0.61–1.24)
GFR, Estimated: 13 mL/min — ABNORMAL LOW (ref >60)
Glucose, Bld: 87 mg/dL (ref 70–99)
Potassium: 4.4 mmol/L (ref 3.5–5.1)
Sodium: 132 mmol/L — ABNORMAL LOW (ref 135–145)

## 2021-02-19 LAB — CBC
HCT: 28.3 % — ABNORMAL LOW (ref 39.0–52.0)
Hemoglobin: 9 g/dL — ABNORMAL LOW (ref 13.0–17.0)
MCH: 34.7 pg — ABNORMAL HIGH (ref 26.0–34.0)
MCHC: 31.8 g/dL (ref 30.0–36.0)
MCV: 109.3 fL — ABNORMAL HIGH (ref 80.0–100.0)
Platelets: 243 10*3/uL (ref 150–400)
RBC: 2.59 MIL/uL — ABNORMAL LOW (ref 4.22–5.81)
RDW: 19.2 % — ABNORMAL HIGH (ref 11.5–15.5)
WBC: 11.6 10*3/uL — ABNORMAL HIGH (ref 4.0–10.5)
nRBC: 0.3 % — ABNORMAL HIGH (ref 0.0–0.2)

## 2021-02-19 LAB — GLUCOSE, CAPILLARY
Glucose-Capillary: 77 mg/dL (ref 70–99)
Glucose-Capillary: 80 mg/dL (ref 70–99)

## 2021-02-19 MED ORDER — HYDROMORPHONE HCL 1 MG/ML IJ SOLN
INTRAMUSCULAR | Status: AC
  Filled 2021-02-19: qty 1

## 2021-02-19 MED ORDER — CEPHALEXIN 500 MG PO CAPS
500.0000 mg | ORAL_CAPSULE | Freq: Two times a day (BID) | ORAL | 0 refills | Status: AC
  Filled 2021-02-19: qty 34, 17d supply, fill #0

## 2021-02-19 MED ORDER — VANCOMYCIN HCL IN DEXTROSE 1-5 GM/200ML-% IV SOLN: 1000.0000 mg | INTRAVENOUS | Status: AC

## 2021-02-19 MED ORDER — OXYCODONE HCL 5 MG PO TABS: 5.0000 mg | ORAL_TABLET | Freq: Four times a day (QID) | ORAL | 0 refills | Status: DC | PRN

## 2021-02-19 NOTE — TOC Transition Note (Signed)
Transition of Care [facility]) - CM/SW Discharge Note   Patient Details  Name: Brendan Campbell MRN: [id number] Date of Birth: 09-07-1970  [facility]) CM/SW Contact:  Tom-Johnson, Renea Ee, RN Phone Number: 02/19/2021, 12:00 PM   Clinical Narrative:    Patient is scheduled for discharge today. Home health referral with [facility]. Hospital bed, sliding board and walker to be delivered at his home, CM verified with Freda Munro with [employer]. Transportation scheduled with [facility]. No further TOC needs noted.   Final next level of care: [place] Barriers to Discharge: Barriers Resolved   Patient Goals and CMS Choice Patient states their goals for this hospitalization and ongoing recovery are:: To return home CMS Medicare.gov Compare Post Acute Care list provided to:: Patient Choice offered to / list presented to : Patient  Discharge Placement                Patient to be transferred to facility by: [place] (Going home)   Patient and family notified of of transfer: 02/19/21  Discharge Plan and Services   Discharge Planning Services: CM Consult Post Acute Care Choice: Home Health          DME Arranged: 3-N-1, Hospital bed, Walker rolling, Other see comment (Sliding board) DME Agency: [employer] Date DME Agency Contacted: 02/17/21 Time DME Agency Contacted: [phone] Representative spoke with at DME Agency: Freda Munro HH Arranged: PT, OT [place] Agency: [employer] Date [place]: 02/18/21 Time [place]: [phone] Representative spoke with at [place]: [place] ([place]) Interventions     Readmission Risk Interventions Readmission Risk Prevention Plan 07/21/2020  Transportation Screening Complete  PCP or Specialist Appt within 3-5 Days Complete  HRI or [place] Complete  Social Work Consult for [place] Planning/Counseling [place] Screening Not Applicable   Medication Review Press photographer) Complete  Some recent data might be hidden

## 2021-02-19 NOTE — Discharge Instructions (Signed)

## 2021-02-19 NOTE — Progress Notes (Signed)
Pt to d/c to home today. Contacted [employer] and spoke to [place]. Clinic aware pt to d/c today and will resume care on Monday. Clinic advised of pt's iv vanc need at d/c and d/c summary faxed to clinic today for continuation of care (fax# [phone]). Attending and nephrology team felt pt required HD here today due to medical needs being addressed this am by other disciplines/providers.   Melven Sartorius Renal Navigator [phone]

## 2021-02-19 NOTE — Progress Notes (Signed)
°  Progress Note    02/19/2021 11:51 AM 3 Days Post-Op  Subjective:  says he's having some pain in the left stump but doing ok.  Says he is going home today  Afebrile  Vitals:   02/19/21 1015 02/19/21 1040  BP: 115/60 128/81  Pulse: (!) 110 (!) 105  Resp: (!) 23 18  Temp: 97.6 F (36.4 C) 98.4 F (36.9 C)  SpO2: 99% 99%    Physical Exam: Incisions:  wrapped with shrinker in place.  He is able to straighten his knee.   CBC    Component Value Date/Time   WBC 11.6 (H) 02/19/2021 0148   RBC 2.59 (L) 02/19/2021 0148   HGB 9.0 (L) 02/19/2021 0148   HGB 10.4 (L) 06/02/2016 1107   HCT 28.3 (L) 02/19/2021 0148   HCT 32.9 (L) 06/02/2016 1107   PLT 243 02/19/2021 0148   PLT 389 (H) 06/02/2016 1107   MCV 109.3 (H) 02/19/2021 0148   MCV 94 06/02/2016 1107   MCH 34.7 (H) 02/19/2021 0148   MCHC 31.8 02/19/2021 0148   RDW 19.2 (H) 02/19/2021 0148   RDW 16.0 (H) 06/02/2016 1107   LYMPHSABS 1.0 02/18/2021 0301   LYMPHSABS 2.9 06/02/2016 1107   MONOABS 0.6 02/18/2021 0301   EOSABS 0.1 02/18/2021 0301   EOSABS 0.5 (H) 06/02/2016 1107   BASOSABS 0.0 02/18/2021 0301   BASOSABS 0.1 06/02/2016 1107    BMET    Component Value Date/Time   NA 132 (L) 02/19/2021 0148   NA 142 12/16/2015 1051   K 4.4 02/19/2021 0148   CL 93 (L) 02/19/2021 0148   CO2 29 02/19/2021 0148   GLUCOSE 87 02/19/2021 0148   BUN 28 (H) 02/19/2021 0148   BUN 45 (H) 12/16/2015 1051   CREATININE 5.00 (H) 02/19/2021 0148   CALCIUM 9.9 02/19/2021 0148   GFRNONAA 13 (L) 02/19/2021 0148   GFRAA 9 (L) 09/27/2019 0951    INR    Component Value Date/Time   INR 1.4 (H) 02/13/2021 0304     Intake/Output Summary (Last 24 hours) at 02/19/2021 1151 Last data filed at 02/19/2021 1015 Gross per 24 hour  Intake 600 ml  Output 3286 ml  Net -2686 ml     Assessment/Plan:  52 y.o. male is s/p left below knee amputation  3 Days Post-Op  -pt doing well from vascular standpoint.  Encouraged pt to continue on  working straightening his knee. -will f/u in 4 weeks for staple removal and our office will arrange appt. -Rx sent to [facility] in [place] [facility] for Percocet 5/325 one po q6h prn pain # 20 no refill.   Leontine Locket, PA-C [facility] [phone] 02/19/2021 11:51 AM

## 2021-02-19 NOTE — Discharge Summary (Signed)
Physician Discharge Summary  ORVAL DORTCH OYD:[id number] DOB: [birth year] DOA: 02/12/2021  PCP: Monico Blitz, MD  Admit date: 02/12/2021 Discharge date: 02/19/2021  Admitted From: home Disposition:  home  Recommendations for Outpatient Follow-up:  Follow up with PCP in 1-2 weeks and vascular surgery for postop care and also remove staples in 4 weeks Please obtain BMP/CBC in one week Please follow up on the following pending results:  Home Health:yes  Equipment/Devices: yes  Discharge Condition: Stable Code Status:   Code Status: Full Code Diet recommendation:  Diet Order             Diet - low sodium heart healthy           Diet renal/carb modified with fluid restriction Diet-HS Snack? Nothing; Fluid restriction: 1200 mL Fluid; Room service appropriate? Yes; Fluid consistency: Thin  Diet effective now                    Brief/Interim Summary: 51 y.o. male with PMH of sCHF, ESRD on HD, DMII, HLD, PVD, neuropathy, PTSD who presented to [facility] with worsening weakness/fatigue. He had recent MRSA bacteremia and left leg cellulitis and had been on 6 weeks of vancomycin with HD. Patient has missed some doses due to missing HD presented with worsening of left foot osteomyelitis and had dry gangrene on admission; he has agreed for left BKA to be performed on 02/16/2021. ID was reconsulted on Monday.His right stump also looks rough and ID ordered MRI started Rocephin to broaden some coverage. Patient underwent left BKA 1/10.  Underwent MRI of the right below-knee stump that showed cellulitis/fluid no osteomyelitis, ID advised to complete the course of his vancomycin through 03/15/2021 and Keflex through 03/08/2021 for cellulitis, blood culture 1/6 has remained negative although he had missed few days of vancomycin as outpatient, TTE 12/31 no vegetation. He has adamantly denied CIR SNF and wants to return home with home health TOC has arranged for equipment for discharge home  Discharge  Diagnoses:   Extensive left foot wound with significant tissue loss LLE dry gangrenous wound: ABI left with noncompressible left lower extremity arteries,s/p left BKA  1/10.  Vascular following-Left BKA dressing removed by vascular-staple intact with viable flaps, no appreciable fluid collection and dressing reapplied with [place] placed on left leg, will need follow-up in 4 weeks to have his staples removed.  He is on vancomycin and Keflex see below. transition to simple gauze and stump sock 1/13 and needs to wear his stump protector at all times-this was communicated with the patient.   Recent MRSA bacteremia secondary to LLE cellulitis RLE cellulitis/ rt stump edema: blood culture 1/6 has remained negative although he had missed few days of vancomycin as outpatient, TTE 12/31 no vegetation.  MRI of the right below-knee stump that showed cellulitis/fluid no osteomyelitis, ID advised to complete the course of his vancomycin through 03/15/2021 and Keflex through 03/08/2021.  Fluid removal per HD   Hyperkalemia: Due to missed dialysis.  Improved.  Continue HD per nephrology ESRD on HD MWF-last HD 1/11 and repeat HD 1/12 not done due to staffing, patient is due for dialysis 6/72 Metabolic bone disease: Monitor calcium and Phos continue PhosLo Anemia of chronic renal disease monitor hemoglobin stable-and transfuse 7 g. Recent Labs  Lab 02/15/21 2231 02/16/21 0958 02/17/21 0220 02/18/21 0301 02/19/21 0148  HGB 9.3* 12.6* 9.3* 8.8* 9.0*  HCT 29.0* 37.0* 29.1* 27.4* 28.3*      PVD History of right BKA, now left CNO:[id number]  aspirin Lipitor,.   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   Chronic A. fib on Eliquis okay to resume Eliquis per vascular today.  Continue Coreg.  Continue to monitor.     Rash diffuse rash papules on back and scattered likely due to staph colonization, outpatient referral and  continue symptomatic management.   HLD continue Lipitor.   T2DM: Last A1c 5.5, diet controlled, monitor CBG Recent Labs  Lab 02/16/21 1321 02/17/21 0646 02/18/21 0556 02/19/21 0640 02/19/21 0708  GLUCAP 91 124* 99 77 80      Class I Obesity:Body mass index is 32.39 kg/m. will benefit with PCP follow-up, weight loss  healthy lifestyle and outpatient sleep evaluation  Consults: Vascular surgery, nephrology  Subjective: Seen this morning in dialysis alert awake, equipment getting ready for discharge home today after dialysis Discharge Exam: Vitals:   02/19/21 0900 02/19/21 0930  BP: 107/64 (!) 113/31  Pulse: 94 96  Resp: 17 13  Temp:    SpO2:     General: Pt is alert, awake, not in acute distress Cardiovascular: RRR, S1/S2 +, no rubs, no gallops Respiratory: CTA bilaterally, no wheezing, no rhonchi Abdominal: Soft, NT, ND, bowel sounds + Extremities: no edema, no cyanosis  Discharge Instructions  Discharge Instructions     Diet - low sodium heart healthy   Complete by: As directed    Discharge instructions   Complete by: As directed    CBC, BMP in 1 wk  Continue vancomycin with dialysis through 03/15/2021  Continue oral Keflex through 03/08/2021  Follow-up with vascular surgery for postop care call office in 1 or 2 days  Please call call MD or return to ER for similar or worsening recurring problem that brought you to hospital or if any fever,nausea/vomiting,abdominal pain, uncontrolled pain, chest pain,  shortness of breath or any other alarming symptoms.  Please follow-up your doctor as instructed in a week time and call the office for appointment.  Please avoid alcohol, smoking, or any other illicit substance and maintain healthy habits including taking your regular medications as prescribed.  You were cared for by a hospitalist during your hospital stay. If you have any questions about your discharge medications or the care you received while you were in the  hospital after you are discharged, you can call the unit and ask to speak with the hospitalist on call if the hospitalist that took care of you is not available.  Once you are discharged, your primary care physician will handle any further medical issues. Please note that NO REFILLS for any discharge medications will be authorized once you are discharged, as it is imperative that you return to your primary care physician (or establish a relationship with a primary care physician if you do not have one) for your aftercare needs so that they can reassess your need for medications and monitor your lab values   Discharge wound care:   Complete by: As directed    Daily dry dressing changes with compression on left stump.  Transition to simple gauze and stump sock from 11323 Needs to wear stump protector at all times.   Increase activity slowly   Complete by: As directed       Allergies as of 02/19/2021       Reactions   Tape Other (See Comments)   Pulls skin off   Chlorhexidine Gluconate [chlorhexidine] Rash        Medication List  TAKE these medications    acetaminophen 325 MG tablet Commonly known as: TYLENOL Take 2 tablets (650 mg total) by mouth every 6 (six) hours as needed for mild pain, moderate pain or fever.   albuterol 108 (90 Base) MCG/ACT inhaler Commonly known as: VENTOLIN HFA Inhale 2 puffs into the lungs every 4 (four) hours as needed for wheezing or shortness of breath.   apixaban 5 MG Tabs tablet Commonly known as: ELIQUIS Take 1 tablet (5 mg total) by mouth 2 (two) times daily.   aspirin 81 MG chewable tablet Chew 81 mg by mouth daily.   atorvastatin 10 MG tablet Commonly known as: LIPITOR Take 1 tablet (10 mg total) by mouth every evening. What changed: additional instructions   butalbital-acetaminophen-caffeine 50-325-40 MG tablet Commonly known as: FIORICET Take 1 tablet by mouth every 6 (six) hours as needed for headache or migraine.   calcium  acetate 667 MG capsule Commonly known as: PHOSLO Take 1 capsule (667 mg total) by mouth 3 (three) times daily with meals.   calcium carbonate 500 MG chewable tablet Commonly known as: TUMS - dosed in mg elemental calcium Chew 4 tablets (800 mg of elemental calcium total) by mouth 2 (two) times daily.   carvedilol 3.125 MG tablet Commonly known as: Coreg Take 1 tablet (3.125 mg total) by mouth 2 (two) times daily with a meal.   cephALEXin 500 MG capsule Commonly known as: KEFLEX Take 1 capsule (500 mg total) by mouth every 12 (twelve) hours for 17 days.   oxyCODONE 5 MG immediate release tablet Commonly known as: Oxy IR/ROXICODONE Take 1 tablet (5 mg total) by mouth every 6 (six) hours as needed for severe pain.   pantoprazole 20 MG tablet Commonly known as: PROTONIX Take 1 tablet (20 mg total) by mouth daily.   vancomycin 1-5 GM/200ML-% Soln Commonly known as: VANCOCIN Inject 200 mLs (1,000 mg total) into the vein every Monday, Wednesday, and Friday with hemodialysis for 24 days.   Veltassa 8.4 g packet Generic drug: patiromer Take 1 packet (8.4 g total) by mouth 3 (three) times a week. On Tuesday, Thursday and Saturday   Vitamin D3 25 MCG (1000 UT) Caps Take 1 capsule by mouth See admin instructions. Take before Dialysis on Monday Wednesday and Fridays               Durable Medical Equipment  (From admission, onward)           Start     Ordered   02/17/21 1611  For home use only DME Other see comment  Once       Comments: Sliding board  Question:  Length of Need  Answer:  Lifetime   02/17/21 1611   02/17/21 1610  For home use only DME Walker rolling  Once       Question Answer Comment  Walker: With [place] Wheels   Patient needs a walker to treat with the following condition Gait instability      02/17/21 1611   02/17/21 1609  For home use only DME Hospital bed  Once       Question Answer Comment  Length of Need 12 Months   The above medical condition  requires: Patient requires the ability to reposition frequently   Head must be elevated greater than: 45 degrees   Bed type Semi-electric   Trapeze Bar Yes   Support Surface: Gel Overlay      02/17/21 1611  Discharge Care Instructions  (From admission, onward)           Start     Ordered   02/19/21 0000  Discharge wound care:       Comments: Daily dry dressing changes with compression on left stump.  Transition to simple gauze and stump sock from 11323 Needs to wear stump protector at all times.   02/19/21 1023            Follow-up Information     [facility] Follow up in 4 week(s).   Specialty: Vascular Surgery Why: Office will call you to arrange your appt (sent) Contact information: [place], [facility] Follow up.   Specialty: Home Health Services Why: Someone will call you to schedule first home visit. Contact information: [place] [address] [phone]                Allergies  Allergen Reactions   Tape Other (See Comments)    Pulls skin off   Chlorhexidine Gluconate [Chlorhexidine] Rash    The results of significant diagnostics from this hospitalization (including imaging, microbiology, ancillary and laboratory) are listed below for reference.    Microbiology: Recent Results (from the past 240 hour(s))  Resp Panel by RT-PCR (Flu A&B, Covid) Nasopharyngeal Swab     Status: None   Collection Time: 02/12/21  9:48 AM   Specimen: Nasopharyngeal Swab; Nasopharyngeal(NP) swabs in vial transport medium  Result Value Ref Range Status   SARS Coronavirus 2 by RT PCR NEGATIVE NEGATIVE Final    Comment: (NOTE) SARS-CoV-2 target nucleic acids are NOT DETECTED.  The SARS-CoV-2 RNA is generally detectable in upper respiratory specimens during the acute phase of infection. The lowest concentration of  SARS-CoV-2 viral copies this assay can detect is 138 copies/mL. A negative result does not preclude SARS-Cov-2 infection and should not be used as the sole basis for treatment or other patient management decisions. A negative result may occur with  improper specimen collection/handling, submission of specimen other than nasopharyngeal swab, presence of viral mutation(s) within the areas targeted by this assay, and inadequate number of viral copies(<138 copies/mL). A negative result must be combined with clinical observations, patient history, and epidemiological information. The expected result is Negative.  Fact Sheet for Patients:  EntrepreneurPulse.com.au  Fact Sheet for Healthcare Providers:  IncredibleEmployment.be  This test is no t yet approved or cleared by the [place] FDA and  has been authorized for detection and/or diagnosis of SARS-CoV-2 by FDA under an Emergency Use Authorization (EUA). This EUA will remain  in effect (meaning this test can be used) for the duration of the COVID-19 declaration under Section 564(b)(1) of the Act, 21 U.S.C.section 360bbb-3(b)(1), unless the authorization is terminated  or revoked sooner.       Influenza A by PCR NEGATIVE NEGATIVE Final   Influenza B by PCR NEGATIVE NEGATIVE Final    Comment: (NOTE) The Xpert Xpress SARS-CoV-2/FLU/RSV plus assay is intended as an aid in the diagnosis of influenza from Nasopharyngeal swab specimens and should not be used as a sole basis for treatment. Nasal washings and aspirates are unacceptable for Xpert Xpress SARS-CoV-2/FLU/RSV testing.  Fact Sheet for Patients: EntrepreneurPulse.com.au  Fact Sheet for Healthcare Providers: IncredibleEmployment.be  This test is not yet approved or cleared by the [place] FDA and has been authorized for detection and/or diagnosis of SARS-CoV-2 by FDA  under an Emergency Use  Authorization (EUA). This EUA will remain in effect (meaning this test can be used) for the duration of the COVID-19 declaration under Section 564(b)(1) of the Act, 21 U.S.C. section 360bbb-3(b)(1), unless the authorization is terminated or revoked.  Performed at [facility], [address], [place], [place] [address]   Blood culture (routine x 2)     Status: None   Collection Time: 02/12/21 10:05 AM   Specimen: Right Antecubital; Blood  Result Value Ref Range Status   Specimen Description RIGHT ANTECUBITAL  Final   Special Requests   Final    BOTTLES DRAWN AEROBIC AND ANAEROBIC Blood Culture adequate volume   Culture   Final    NO GROWTH 5 DAYS Performed at [facility], [address], [place], [place] [address]    Report Status 02/17/2021 FINAL  Final  Blood culture (routine x 2)     Status: None   Collection Time: 02/12/21 10:55 AM   Specimen: BLOOD RIGHT FOREARM  Result Value Ref Range Status   Specimen Description BLOOD RIGHT FOREARM  Final   Special Requests   Final    BOTTLES DRAWN AEROBIC AND ANAEROBIC Blood Culture adequate volume   Culture   Final    NO GROWTH 5 DAYS Performed at [facility], [address], [place], [place] [address]    Report Status 02/17/2021 FINAL  Final  Surgical pcr screen     Status: Abnormal   Collection Time: 02/16/21  5:46 AM   Specimen: Nasal Mucosa; Nasal Swab  Result Value Ref Range Status   MRSA, PCR POSITIVE (A) NEGATIVE Final    Comment: RESULT CALLED TO, READ BACK BY AND VERIFIED WITH: RN T BROWN [id number] AT 70 AM BY CM    Staphylococcus aureus POSITIVE (A) NEGATIVE Final    Comment: (NOTE) The Xpert SA Assay (FDA approved for NASAL specimens in patients 72 years of age and older), is one component of a comprehensive surveillance program. It is not intended to diagnose infection nor to guide or monitor treatment. Performed at [facility], [place] [address] [place], [place] [address]     Procedures/Studies: CT ANGIO  AO+BIFEM W & OR WO CONTRAST  Result Date: 02/12/2021 CLINICAL DATA:  Left foot pain for the past 3 weeks. Concern for ischemia affecting the left foot. EXAM: CT ANGIOGRAPHY OF ABDOMINAL AORTA WITH ILIOFEMORAL RUNOFF TECHNIQUE: Multidetector CT imaging of the abdomen, pelvis and lower extremities was performed using the standard protocol during bolus administration of intravenous contrast. Multiplanar CT image reconstructions and MIPs were obtained to evaluate the vascular anatomy. CONTRAST:  161mL OMNIPAQUE IOHEXOL 350 MG/ML SOLN COMPARISON:  None. FINDINGS: VASCULAR Aorta: Moderate amount of calcified atherosclerotic plaque within normal caliber abdominal aorta, not resulting in a hemodynamically significant stenosis. No evidence of abdominal aortic dissection or perivascular stranding. Celiac: Widely patent without hemodynamically significant narrowing. SMA: Widely patent without hemodynamically significant narrowing. A replaced right hepatic artery is incidentally noted to arise from the proximal SMA. The distal tributaries of the SMA appear widely patent without discrete lumen filling defect to suggest distal embolism. Renals: Duplicated left renal arteries, nearly co-dominant. All renal arteries are heavily diseased with tandem areas of suspected hemodynamically significant narrowings, an expected finding given end-stage renal disease and associated bilateral renal atrophy. 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 _________________________________________________________ RIGHT Lower Extremity Inflow: There is a minimal amount of calcified atherosclerotic plaque involving the normal caliber right  common iliac artery, not resulting in a hemodynamically significant stenosis. The right internal and external iliac arteries are mildly diseased though patent and of normal caliber. Outflow: There is mixed calcified and noncalcified atherosclerotic plaque  involving the right common femoral artery, not resulting in hemodynamically significant stenosis. The right deep femoral artery is disease though patent of normal caliber. There is circumferential predominantly calcified atherosclerotic plaque throughout the right superficial femoral artery, which ultimately occludes at the level of the adductor canal. Both the right above and below-knee popliteal artery is occluded. Runoff: There is no significant tibial runoff to the residual below-knee amputation, though evaluation degraded secondary to extensive mural calcifications. _________________________________________________________ LEFT Lower Extremity Inflow: There is a minimal amount of calcified atherosclerotic plaque involving the normal caliber left common iliac artery, not resulting in a hemodynamically significant stenosis. The left internal and external iliac arteries are mildly diseased though patent and of normal caliber. Outflow: There is a minimal amount of calcified atherosclerotic plaque involving the left common femoral artery, not resulting in hemodynamically significant stenosis. The left deep femoral artery is patent and of normal caliber. There is concentric predominantly calcified atherosclerotic plaque throughout the left superficial femoral artery not definitely resulting in hemodynamically significant stenosis, though note, evaluation degraded secondary to extensive mural calcifications. There is a moderate to large amount of eccentric mixed calcified and noncalcified atherosclerotic plaque involving the left above knee popliteal artery resulting in tandem areas of at least 50% luminal narrowing (image 264, 287 and 293, series 5). The left below-knee popliteal artery appears patent without hemodynamically significant narrowing. Runoff: Apparent three-vessel runoff to the left lower leg, though note, evaluation degraded secondary to significant mural calcifications. A definitive left-sided dorsalis  pedis artery is not identified. No discrete lumen filling defects to suggest distal embolism. Veins: Normal appearance of the IVC and pelvic venous system on this arterial phase examination. Review of the MIP images confirms the above findings. _________________________________________________________ _________________________________________________________ NON-VASCULAR Evaluation of abdominal organs is limited to the arterial phase of enhancement. Lower chest: Limited visualization of the lower thorax is negative for focal airspace opacity though does demonstrate mild interstitial thickening. No discrete focal airspace opacities. No pleural effusion. Cardiomegaly. Coronary artery calcifications. No pericardial effusion. Hepatobiliary: Normal hepatic contour. There is diffuse decreased attenuation of the hepatic parenchyma suggestive of hepatic steatosis. No discrete hyperenhancing hepatic lesions. Post cholecystectomy. No intra or extrahepatic biliary duct dilatation. Trace amount of perihepatic ascites. Pancreas: Pancreas is largely fatty replaced. Spleen: Normal appearance of the spleen. Adrenals/Urinary Tract: The bilateral kidneys are markedly atrophic compatible with known history of end-stage renal disease. No urinary obstruction. There is mild thickening the left adrenal gland without discrete nodule. Normal appearance of the right adrenal gland. Diffuse thickening the urinary bladder wall, likely accentuated due to underdistention. Stomach/Bowel: Large colonic stool burden, particularly within the rectal vault. No evidence of enteric obstruction. Normal appearance of the terminal ileum and the retrocecal appendix. No hiatal hernia. No discrete areas of bowel wall thickening. No pneumoperitoneum, pneumatosis or portal venous gas. Lymphatic: No bulky retroperitoneal, mesenteric, pelvic or inguinal lymphadenopathy. Reproductive: Normal appearance the prostate gland. There is a small amount of fluid within the  pelvic cul-de-sac. Other: Apparent 1.3 cm soft tissue ulcer involving the medial mid aspect of the left foot (image 459, series 5). No discrete areas of osteolysis to suggest osteomyelitis. Diffuse body wall anasarca. Musculoskeletal: No definite acute or aggressive osseous abnormalities. There is diffuse increased sclerosis of the imaged osseous structures suggestive of renal osteodystrophy.  Prominent Schmorl's node are seen involving several vertebral bodies, most conspicuously involving L2. Patient is post right-sided below-knee amputation as well as amputation of the first and second digits. IMPRESSION: VASCULAR 1. Large amount of atherosclerotic plaque within normal caliber abdominal aorta, not resulting in a hemodynamically significant stenosis. Aortic aneurysm NOS (ICD10-I71.9). 2. Post right below-knee amputation with age-indeterminate though presumably chronic occlusion of the distal aspect of the right superficial femoral artery and the popliteal artery with no significant runoff to the residual stump, though evaluation degraded secondary to extensive mural calcifications. 3. Suspected tandem areas of hemodynamically significant narrowing involving the left above knee popliteal artery. 4. Apparent three-vessel runoff to the left lower leg, though note, evaluation degraded secondary to extensive mural calcifications. A discrete left-sided dorsalis pedis artery is not identified. No discrete intraluminal filling defects to suggest distal embolism. NON-VASCULAR 1. Apparent 1.3 cm soft tissue ulcer about the medial mid aspect of the left foot without discrete area of osteolysis to suggest osteomyelitis. Further evaluation with MRI could be performed as indicated. 2. Atrophic kidneys compatible with known history of end-stage renal disease. 3. Cardiomegaly with diffuse body wall anasarca and stigmata of pulmonary edema within the imaged lung bases. No pleural effusions. Electronically Signed   By: Sandi Mariscal  M.D.   On: 02/12/2021 15:16   MR YBOFB RIGHT WO CONTRAST  Result Date: 02/16/2021 CLINICAL DATA:  Bacteremia. EXAM: MRI OF THE RIGHT FEMUR WITHOUT CONTRAST TECHNIQUE: Multiplanar, multisequence MR imaging of the right femur was performed. No intravenous contrast was administered. COMPARISON:  None. FINDINGS: Bones/Joint/Cartilage Marrow signal is within normal limits. No evidence of osteomyelitis. No appreciable hip joint effusion or evidence of septic arthritis. Ligaments Intact Muscles and Tendons Marked muscle edema prominent about rectus lateral talus with trace amount of fluid superficial to the vastus lateralis. There is also mild edema of the hamstring muscles. No intramuscular fluid collection or abscess. Soft tissues Marked skin thickening and subcutaneous soft tissue edema of bilateral lower extremities consistent with cellulitis. No drainable fluid collection or abscess. IMPRESSION: 1.  No evidence of osteomyelitis or septic arthritis. 2. Generalized edema prominent in the right vastus lateralis and about the hamstring muscles concerning for myositis. No drainable fluid collection or abscess. 3. Marked skin thickening and subcutaneous soft tissue edema consistent with diffuse cellulitis. No drainable fluid collection or abscess. Electronically Signed   By: Keane Police D.O.   On: 02/16/2021 18:58   DG Chest Port 1 View  Result Date: 02/12/2021 CLINICAL DATA:  Shortness of breath. EXAM: PORTABLE CHEST 1 VIEW COMPARISON:  February 04, 2021. FINDINGS: Stable cardiomegaly. Mild central pulmonary vascular congestion is noted with probable minimal bilateral pulmonary edema. No consolidative process is noted. Bony thorax is unremarkable. IMPRESSION: Mild cardiomegaly with central pulmonary vascular congestion and probable minimal bilateral pulmonary edema. Electronically Signed   By: Marijo Conception M.D.   On: 02/12/2021 10:29   DG Chest Port 1 View  Result Date: 02/04/2021 CLINICAL DATA:  Left foot  pain x3 weeks with chills and rash. EXAM: PORTABLE CHEST 1 VIEW COMPARISON:  November 26, 2020 FINDINGS: Mild, diffuse, chronic appearing increased interstitial lung markings are seen. This is unchanged in severity when compared to the prior study. There is no evidence of acute infiltrate, pleural effusion or pneumothorax. The cardiac silhouette is moderately enlarged and unchanged in size. The visualized skeletal structures are unremarkable. IMPRESSION: Stable cardiomegaly without evidence of acute or active cardiopulmonary disease. Electronically Signed   By: Joyce Gross.D.  On: 02/04/2021 01:24   DG Foot Complete Left  Result Date: 02/12/2021 CLINICAL DATA:  Infection at the first metatarsophalangeal joint. 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 Osteotomy changes of the proximal phalanx of the third digit. Soft tissue swelling and vascular calcifications. No cortical erosion or periosteal reaction concerning for osteomyelitis. IMPRESSION: 1. Postsurgical changes as detailed above. 2. No radiographic evidence of osteomyelitis, if there is a clinical concern, MRI examination could be obtained for further evaluation. Electronically Signed   By: Keane Police D.O.   On: 02/12/2021 10:28   DG Foot Complete Left  Result Date: 02/04/2021 CLINICAL DATA:  Left foot pain x3 weeks with chills and rash. EXAM: LEFT FOOT - COMPLETE 3+ VIEW COMPARISON:  December 07, 2020 FINDINGS: Surgical amputation of the first and second left toes is noted with stable chronic and postoperative changes seen involving the third left toe. There is no evidence of an fracture or dislocation. No areas of cortical erosion or acute periosteal reaction are seen. Mild degenerative changes are noted along the dorsal aspect of the mid left foot. Loss of the plantar arch is noted. There is mild  diffuse soft tissue swelling which is more prominent along the plantar aspect of the mid left foot. IMPRESSION: 1. Stable chronic and postoperative changes, without evidence of acute osseous abnormality. 2. Plantar soft tissue swelling, as described above, without evidence of acute osteomyelitis. Electronically Signed   By: Virgina Norfolk M.D.   On: 02/04/2021 01:30   VAS [place] ABI WITH/WO TBI  Result Date: 02/15/2021  LOWER EXTREMITY DOPPLER STUDY Patient Name:  JEKHI BOLIN  Date of Exam:   02/15/2021 Medical Rec #: [id number]       Accession #:    [id number] Date of Birth: 1970-07-20        Patient Gender: M Patient Age:   40 years Exam Location:  [facility] Procedure:      VAS [place] ABI WITH/WO TBI Referring Phys: JOSHUA ROBINS --------------------------------------------------------------------------------  Indications: Left Ischemia, critical limb. High Risk Factors: Hyperlipidemia, Diabetes.  Vascular Interventions: Rt bka done. Comparison Study: no prior Performing Technologist: Archie Patten RVS  Examination Guidelines: A complete evaluation includes at minimum, Doppler waveform signals and systolic blood pressure reading at the level of bilateral brachial, anterior tibial, and posterior tibial arteries, when vessel segments are accessible. Bilateral testing is considered an integral part of a complete examination. Photoelectric Plethysmograph (PPG) waveforms and toe systolic pressure readings are included as required and additional duplex testing as needed. Limited examinations for reoccurring indications may be performed as noted.  ABI Findings: +--------+------------------+-----+---------+--------+  Right    Rt Pressure (mmHg) Index Waveform  Comment   +--------+------------------+-----+---------+--------+  Brachial 123                      triphasic           +--------+------------------+-----+---------+--------+  PTA                                         bka        +--------+------------------+-----+---------+--------+  DP  bka       +--------+------------------+-----+---------+--------+ +---------+------------------+-----+--------+----------------------------+  Left      Lt Pressure (mmHg) Index Waveform Comment                       +---------+------------------+-----+--------+----------------------------+  Brachial                                    fistula                       +---------+------------------+-----+--------+----------------------------+  PTA       255                2.07  biphasic                               +---------+------------------+-----+--------+----------------------------+  DP        255                2.07  biphasic                               +---------+------------------+-----+--------+----------------------------+  Great Toe                                   great toe wound and bandaged  +---------+------------------+-----+--------+----------------------------+ +-------+-----------+-----------+------------+------------+  ABI/TBI Today's ABI Today's TBI Previous ABI Previous TBI  +-------+-----------+-----------+------------+------------+  Right   bka                                                +-------+-----------+-----------+------------+------------+  Left    2.07                                               +-------+-----------+-----------+------------+------------+   Summary: Right: Bka. 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  *See table(s) above for measurements and observations.  Electronically signed by Servando Snare MD on 02/15/2021 at 5:54:07 PM.    Final    ECHOCARDIOGRAM COMPLETE  Result Date: 02/06/2021    ECHOCARDIOGRAM REPORT   Patient Name:   MALAKIE BALIS Date of Exam: 02/06/2021 Medical Rec #:  [id number]      Height:       69.0 in Accession #:    [id number]     Weight:       205.9 lb Date of Birth:  1970/10/07       BSA:           2.092 m Patient Age:    74 years       BP:           126/90 mmHg Patient Gender: M              HR:           102 bpm. Exam Location:  [facility] Procedure: 2D Echo, Cardiac Doppler and Color Doppler Indications:    Endocarditis  History:        Patient has prior  history of Echocardiogram examinations, most                 recent 04/08/2020. CHF; Risk Factors:Diabetes and Dyslipidemia.  Sonographer:    Bernadene Person RDCS Referring Phys: [id number] JEFFREY T MCCLUNG IMPRESSIONS  1. LV apical false tendon (normal variant). Left ventricular ejection fraction, by estimation, is 25 to 30%. The left ventricle has severely decreased function. The left ventricle demonstrates global hypokinesis. There is mild left ventricular hypertrophy. Left ventricular diastolic parameters are indeterminate.  2. Right ventricular systolic function is low normal. The right ventricular size is mildly enlarged. There is normal pulmonary artery systolic pressure. The estimated right ventricular systolic pressure is 60.4 mmHg.  3. Left atrial size was moderately dilated.  4. The mitral valve is abnormal. Mild mitral valve regurgitation. Moderate mitral annular calcification.  5. The aortic valve is tricuspid. There is mild calcification of the aortic valve. Aortic valve regurgitation is not visualized. No aortic stenosis is present.  6. Aortic dilatation noted. There is borderline dilatation of the ascending aorta, measuring 39 mm.  7. The inferior vena cava is dilated in size with <50% respiratory variability, suggesting right atrial pressure of 15 mmHg. Comparison(s): Changes from prior study are noted. 04/08/2020: LVEF 20-25%, severe global hypokinesis, normal RV systolic function. Conclusion(s)/Recommendation(s): No evidence of valvular vegetations on this transthoracic echocardiogram. Consider a transesophageal echocardiogram to exclude infective endocarditis if clinically indicated. FINDINGS  Left Ventricle: LV apical false tendon (normal  variant). Left ventricular ejection fraction, by estimation, is 25 to 30%. The left ventricle has severely decreased function. The left ventricle demonstrates global hypokinesis. The left ventricular internal cavity size was normal in size. There is mild left ventricular hypertrophy. Left ventricular diastolic parameters are indeterminate. Right Ventricle: The right ventricular size is mildly enlarged. No increase in right ventricular wall thickness. Right ventricular systolic function is low normal. There is normal pulmonary artery systolic pressure. The tricuspid regurgitant velocity is 2.28 m/s, and with an assumed right atrial pressure of 15 mmHg, the estimated right ventricular systolic pressure is 54.0 mmHg. Left Atrium: Left atrial size was moderately dilated. Right Atrium: Right atrial size was normal in size. Pericardium: There is no evidence of pericardial effusion. Mitral Valve: The mitral valve is abnormal. There is mild thickening of the mitral valve leaflet(s). Moderate mitral annular calcification. Mild mitral valve regurgitation. Tricuspid Valve: The tricuspid valve is grossly normal. Tricuspid valve regurgitation is mild. Aortic Valve: The aortic valve is tricuspid. There is mild calcification of the aortic valve. Aortic valve regurgitation is not visualized. No aortic stenosis is present. Pulmonic Valve: The pulmonic valve was grossly normal. Pulmonic valve regurgitation is not visualized. Aorta: Aortic dilatation noted. There is borderline dilatation of the ascending aorta, measuring 39 mm. Venous: The inferior vena cava is dilated in size with less than 50% respiratory variability, suggesting right atrial pressure of 15 mmHg. IAS/Shunts: No atrial level shunt detected by color flow Doppler.  LEFT VENTRICLE PLAX 2D LVIDd:         5.50 cm LVIDs:         4.20 cm LV PW:         1.20 cm LV IVS:        1.10 cm LVOT diam:     2.10 cm LV SV:         45 LV SV Index:   22 LVOT Area:     3.46 cm  LV  Volumes (MOD) LV vol d, MOD A2C: 210.0 ml LV vol  d, MOD A4C: 203.0 ml LV vol s, MOD A2C: 135.0 ml LV vol s, MOD A4C: 138.0 ml LV SV MOD A2C:     75.0 ml LV SV MOD A4C:     203.0 ml LV SV MOD BP:      71.9 ml RIGHT VENTRICLE TAPSE (M-mode): 1.3 cm LEFT ATRIUM             Index        RIGHT ATRIUM           Index LA diam:        5.30 cm 2.53 cm/m   RA Area:     22.00 cm LA Vol (A2C):   79.2 ml 37.86 ml/m  RA Volume:   68.00 ml  32.51 ml/m LA Vol (A4C):   80.4 ml 38.44 ml/m LA Biplane Vol: 82.6 ml 39.49 ml/m  AORTIC VALVE LVOT Vmax:   84.80 cm/s LVOT Vmean:  51.800 cm/s LVOT VTI:    0.131 m  AORTA Ao Root diam: 3.40 cm Ao Asc diam:  3.90 cm MR Peak grad:    66.3 mmHg    TRICUSPID VALVE MR Mean grad:    42.0 mmHg    TR Peak grad:   20.8 mmHg MR Vmax:         407.00 cm/s  TR Vmax:        228.00 cm/s MR Vmean:        302.0 cm/s MR PISA:         0.25 cm     SHUNTS MR PISA Eff ROA: 2 mm        Systemic VTI:  0.13 m MR PISA Radius:  0.20 cm      Systemic Diam: 2.10 cm Lyman Bishop MD Electronically signed by Lyman Bishop MD Signature Date/Time: 02/06/2021/2:55:38 PM    Final     Labs: BNP (last 3 results) Recent Labs    07/20/20 0248 07/27/20 1857 02/12/21 1005  BNP 1,637.0* 2,631.0* >2,035.5*   Basic Metabolic Panel: Recent Labs  Lab 02/13/21 1209 02/13/21 1424 02/14/21 0821 02/15/21 0558 02/15/21 9741 02/15/21 2231 02/16/21 0958 02/17/21 0220 02/18/21 0301 02/19/21 0148  NA  --    < > 132* 134* 132* 133* 133* 131*  --  132*  K  --    < > 5.9* 5.3* 5.3* 4.5 4.3 5.3*  --  4.4  CL  --    < > 95* 93* 89* 95* 95* 93*  --  93*  CO2  --    < > 21* 25 25 26   --  25  --  29  GLUCOSE  --    < > 113* 82 90 86 87 127*  --  87  BUN  --    < > 45* 47* 49* 24* 30* 36*  --  28*  CREATININE  --    < > 6.58* 7.43* 7.41* 4.61* 5.30* 5.82*  --  5.00*  CALCIUM  --    < > 9.2 9.7 10.0 8.5*  --  9.3  --  9.9  MG 2.1  --  2.0 2.2  --  1.9  --  2.3 2.1  --   PHOS 5.8*  --   --   --  5.6* 3.9  --  5.4* 3.8   --    < > = values in this interval not displayed.   Liver Function Tests: Recent Labs  Lab 02/15/21 0923  ALBUMIN 2.7*   No results for input(s):  LIPASE, AMYLASE in the last 168 hours. No results for input(s): AMMONIA in the last 168 hours. CBC: Recent Labs  Lab 02/14/21 0821 02/15/21 0558 02/15/21 0924 02/15/21 2231 02/16/21 0958 02/17/21 0220 02/18/21 0301 02/19/21 0148  WBC 11.1* 12.8* 12.6* 10.4  --  10.5 11.0* 11.6*  NEUTROABS 8.4* 9.9*  --  7.8*  --  9.8* 9.3*  --   HGB 9.9* 9.5* 9.1* 9.3* 12.6* 9.3* 8.8* 9.0*  HCT 29.9* 28.7* 28.7* 29.0* 37.0* 29.1* 27.4* 28.3*  MCV 106.0* 104.7* 105.9* 105.5*  --  106.2* 107.9* 109.3*  PLT 231 233 230 204  --  235 215 243   Cardiac Enzymes: No results for input(s): CKTOTAL, CKMB, CKMBINDEX, TROPONINI in the last 168 hours. BNP: Invalid input(s): POCBNP CBG: Recent Labs  Lab 02/16/21 1321 02/17/21 0646 02/18/21 0556 02/19/21 0640 02/19/21 0708  GLUCAP 91 124* 99 77 80   D-Dimer No results for input(s): DDIMER in the last 72 hours. Hgb A1c No results for input(s): HGBA1C in the last 72 hours. Lipid Profile No results for input(s): CHOL, HDL, LDLCALC, TRIG, CHOLHDL, LDLDIRECT in the last 72 hours. Thyroid function studies No results for input(s): TSH, T4TOTAL, T3FREE, THYROIDAB in the last 72 hours.  Invalid input(s): FREET3 Anemia work up No results for input(s): VITAMINB12, FOLATE, FERRITIN, TIBC, IRON, RETICCTPCT in the last 72 hours. Urinalysis No results found for: COLORURINE, APPEARANCEUR, [place], [place], GLUCOSEU, [place], [place], [place], PROTEINUR, UROBILINOGEN, NITRITE, LEUKOCYTESUR Sepsis Labs Invalid input(s): PROCALCITONIN,  WBC,  LACTICIDVEN Microbiology Recent Results (from the past 240 hour(s))  Resp Panel by RT-PCR (Flu A&B, Covid) Nasopharyngeal Swab     Status: None   Collection Time: 02/12/21  9:48 AM   Specimen: Nasopharyngeal Swab; Nasopharyngeal(NP) swabs in vial transport medium   Result Value Ref Range Status   SARS Coronavirus 2 by RT PCR NEGATIVE NEGATIVE Final    Comment: (NOTE) SARS-CoV-2 target nucleic acids are NOT DETECTED.  The SARS-CoV-2 RNA is generally detectable in upper respiratory specimens during the acute phase of infection. The lowest concentration of SARS-CoV-2 viral copies this assay can detect is 138 copies/mL. A negative result does not preclude SARS-Cov-2 infection and should not be used as the sole basis for treatment or other patient management decisions. A negative result may occur with  improper specimen collection/handling, submission of specimen other than nasopharyngeal swab, presence of viral mutation(s) within the areas targeted by this assay, and inadequate number of viral copies(<138 copies/mL). A negative result must be combined with clinical observations, patient history, and epidemiological information. The expected result is Negative.  Fact Sheet for Patients:  EntrepreneurPulse.com.au  Fact Sheet for Healthcare Providers:  IncredibleEmployment.be  This test is no t yet approved or cleared by the [place] FDA and  has been authorized for detection and/or diagnosis of SARS-CoV-2 by FDA under an Emergency Use Authorization (EUA). This EUA will remain  in effect (meaning this test can be used) for the duration of the COVID-19 declaration under Section 564(b)(1) of the Act, 21 U.S.C.section 360bbb-3(b)(1), unless the authorization is terminated  or revoked sooner.       Influenza A by PCR NEGATIVE NEGATIVE Final   Influenza B by PCR NEGATIVE NEGATIVE Final    Comment: (NOTE) The Xpert Xpress SARS-CoV-2/FLU/RSV plus assay is intended as an aid in the diagnosis of influenza from Nasopharyngeal swab specimens and should not be used as a sole basis for treatment. Nasal washings and aspirates are unacceptable for Xpert Xpress SARS-CoV-2/FLU/RSV testing.  Fact Sheet  for  Patients: EntrepreneurPulse.com.au  Fact Sheet for Healthcare Providers: IncredibleEmployment.be  This test is not yet approved or cleared by the [place] FDA and has been authorized for detection and/or diagnosis of SARS-CoV-2 by FDA under an Emergency Use Authorization (EUA). This EUA will remain in effect (meaning this test can be used) for the duration of the COVID-19 declaration under Section 564(b)(1) of the Act, 21 U.S.C. section 360bbb-3(b)(1), unless the authorization is terminated or revoked.  Performed at [facility], [address] [place], [place] [address]   Blood culture (routine x 2)     Status: None   Collection Time: 02/12/21 10:05 AM   Specimen: Right Antecubital; Blood  Result Value Ref Range Status   Specimen Description RIGHT ANTECUBITAL  Final   Special Requests   Final    BOTTLES DRAWN AEROBIC AND ANAEROBIC Blood Culture adequate volume   Culture   Final    NO GROWTH 5 DAYS Performed at [facility], [address], [place], [place] [address]    Report Status 02/17/2021 FINAL  Final  Blood culture (routine x 2)     Status: None   Collection Time: 02/12/21 10:55 AM   Specimen: BLOOD RIGHT FOREARM  Result Value Ref Range Status   Specimen Description BLOOD RIGHT FOREARM  Final   Special Requests   Final    BOTTLES DRAWN AEROBIC AND ANAEROBIC Blood Culture adequate volume   Culture   Final    NO GROWTH 5 DAYS Performed at [facility], [address], [place], [place] [address]    Report Status 02/17/2021 FINAL  Final  Surgical pcr screen     Status: Abnormal   Collection Time: 02/16/21  5:46 AM   Specimen: Nasal Mucosa; Nasal Swab  Result Value Ref Range Status   MRSA, PCR POSITIVE (A) NEGATIVE Final    Comment: RESULT CALLED TO, READ BACK BY AND VERIFIED WITH: RN T BROWN [id number] AT 28 AM BY CM    Staphylococcus aureus POSITIVE (A) NEGATIVE Final    Comment: (NOTE) The Xpert SA Assay (FDA approved for NASAL  specimens in patients 9 years of age and older), is one component of a comprehensive surveillance program. It is not intended to diagnose infection nor to guide or monitor treatment. Performed at [facility], [place] [address], [place], [place] [address]      Time coordinating discharge: 35 minutes  SIGNED: Antonieta Pert, MD  [facility] 02/19/2021, 10:23 AM  If 7PM-7AM, please contact night-coverage [email or web address]

## 2021-02-19 NOTE — Progress Notes (Signed)
Subjective: No current complaints said tolerated dialysis today, noted for discharge home.  SW/CM to arrange transportation back to [facility]  Objective Vital signs in last 24 hours: Vitals:   02/19/21 0930 02/19/21 1000 02/19/21 1015 02/19/21 1040  BP: (!) 113/31 102/66 115/60 128/81  Pulse: 96 74 (!) 110 (!) 105  Resp: 13 14 (!) 23 18  Temp:  97.6 F (36.4 C) 97.6 F (36.4 C) 98.4 F (36.9 C)  TempSrc:      SpO2:   99% 99%  Weight:   94.2 kg   Height:       Weight change:   Physical Exam: General: Alert chronically ill-appearing male NAD Heart: RRR no MRG Lungs: CTA nonlabored breathing Abdomen: Obese NABS, NTND Extremities: Right BKA dressing dry clean no stump edema noted bilaterally Dialysis Acc : LU aVF positive B/T   OP HD: [facility] MWF  4h 60min  95 kg   1K/2.25 bath  Hep 1000+ 500u/hr  LUE AVG  300/500   - prosthetic weighs 1.6 kg  - Epogen 1000   Units IV/HD    Problem/Plan:   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 Virl Cagey. Excoriation and open lesions R Stump. Primary aware. VVS aware. Does not want to do Rehab, plans on going home.  MRSA bacteremia - missed 1 week of IV vancomycin. Resume IV vanc. ID consulted. Added ceftriaxone. Will need to continue Vancomycin through 03/15/2021. Continue Kelfex through 03/08/2021.  Will notify [facility] this  ESRD - MWF. Had planned extra treatment but canceled D/T staffing issues.  Dialysis today on schedule volume stable now  Hypertension/volume  - Blood pressure at goal. HD 01/11 Net UF 4 liters, tolerated well.  Today 3286 net UF   Anemia  - Hgb 9.0 <8.8 on ESA. Given Aranesp 40 mcg IV 02/15/2021.   Metabolic bone disease -   Calcium in goal. PO4 at goal  Continue home meds.  Not on VDRA.   Nutrition - renal diet, carb modified with fluid restrictions.   Prurigo Nodularis-excessive pruritis, skin excoriated from itching. Would consider Korsuva as OP but will add benadryl and sarna lotion for now. PO4 at goal.   Disposition: Does NOT wish to go to rehab. Wants to return home //transportation home today per CM/SW   Ernest Haber, PA-C [place] [phone] 02/19/2021,11:15 AM  LOS: 7 days   Labs: Basic Metabolic Panel: Recent Labs  Lab 02/15/21 2231 02/16/21 0958 02/17/21 0220 02/18/21 0301 02/19/21 0148  NA 133* 133* 131*  --  132*  K 4.5 4.3 5.3*  --  4.4  CL 95* 95* 93*  --  93*  CO2 26  --  25  --  29  GLUCOSE 86 87 127*  --  87  BUN 24* 30* 36*  --  28*  CREATININE 4.61* 5.30* 5.82*  --  5.00*  CALCIUM 8.5*  --  9.3  --  9.9  PHOS 3.9  --  5.4* 3.8  --    Liver Function Tests: Recent Labs  Lab 02/15/21 0923  ALBUMIN 2.7*   No results for input(s): LIPASE, AMYLASE in the last 168 hours. No results for input(s): AMMONIA in the last 168 hours. CBC: Recent Labs  Lab 02/15/21 0924 02/15/21 2231 02/16/21 0958 02/17/21 0220 02/18/21 0301 02/19/21 0148  WBC 12.6* 10.4  --  10.5 11.0* 11.6*  NEUTROABS  --  7.8*  --  9.8* 9.3*  --   HGB 9.1* 9.3*   < >  9.3* 8.8* 9.0*  HCT 28.7* 29.0*   < > 29.1* 27.4* 28.3*  MCV 105.9* 105.5*  --  106.2* 107.9* 109.3*  PLT 230 204  --  235 215 243   < > = values in this interval not displayed.   Cardiac Enzymes: No results for input(s): CKTOTAL, CKMB, CKMBINDEX, TROPONINI in the last 168 hours. CBG: Recent Labs  Lab 02/16/21 1321 02/17/21 0646 02/18/21 0556 02/19/21 0640 02/19/21 0708  GLUCAP 91 124* 99 77 80    Studies/Results: No results found. Medications:  sodium chloride     vancomycin Stopped (02/17/21 1230)    apixaban  5 mg Oral BID   aspirin  81 mg Oral Daily   atorvastatin  10 mg Oral Q M,W,F-1800   calcium acetate  667 mg Oral TID WC   calcium carbonate  800 mg of elemental calcium Oral BID   carvedilol  3.125 mg Oral BID WC   cephALEXin  500 mg Oral Q12H   cholecalciferol  1,000 Units Oral Q M,W,F   darbepoetin (ARANESP) injection - DIALYSIS  40 mcg Intravenous Q Mon-HD    HYDROmorphone       mupirocin ointment  1 application Nasal BID   pantoprazole  20 mg Oral Daily   sodium chloride flush  3 mL Intravenous Q12H   sodium chloride flush  3 mL Intravenous Q12H

## 2021-02-21 ENCOUNTER — Emergency Department (HOSPITAL_COMMUNITY): Payer: Medicaid Other

## 2021-02-21 ENCOUNTER — Other Ambulatory Visit: Payer: Self-pay

## 2021-02-21 ENCOUNTER — Emergency Department (HOSPITAL_COMMUNITY)
Admission: EM | Admit: 2021-02-21 | Discharge: 2021-02-22 | Disposition: A | Discharge status: Home / Self Care | Payer: Medicaid Other | Attending: Student | Admitting: Student

## 2021-02-21 ENCOUNTER — Encounter (HOSPITAL_COMMUNITY): Payer: Self-pay | Admitting: *Deleted

## 2021-02-21 DIAGNOSIS — K5900 Constipation, unspecified: Secondary | ICD-10-CM | POA: Diagnosis present

## 2021-02-21 DIAGNOSIS — N186 End stage renal disease: Secondary | ICD-10-CM | POA: Insufficient documentation

## 2021-02-21 DIAGNOSIS — Z7901 Long term (current) use of anticoagulants: Secondary | ICD-10-CM | POA: Diagnosis not present

## 2021-02-21 DIAGNOSIS — I5043 Acute on chronic combined systolic (congestive) and diastolic (congestive) heart failure: Secondary | ICD-10-CM | POA: Insufficient documentation

## 2021-02-21 DIAGNOSIS — Z87891 Personal history of nicotine dependence: Secondary | ICD-10-CM | POA: Diagnosis not present

## 2021-02-21 DIAGNOSIS — E1140 Type 2 diabetes mellitus with diabetic neuropathy, unspecified: Secondary | ICD-10-CM | POA: Insufficient documentation

## 2021-02-21 DIAGNOSIS — Z7982 Long term (current) use of aspirin: Secondary | ICD-10-CM | POA: Diagnosis not present

## 2021-02-21 DIAGNOSIS — I132 Hypertensive heart and chronic kidney disease with heart failure and with stage 5 chronic kidney disease, or end stage renal disease: Secondary | ICD-10-CM | POA: Insufficient documentation

## 2021-02-21 DIAGNOSIS — Z992 Dependence on renal dialysis: Secondary | ICD-10-CM | POA: Diagnosis not present

## 2021-02-21 DIAGNOSIS — Z8616 Personal history of COVID-19: Secondary | ICD-10-CM | POA: Diagnosis not present

## 2021-02-21 DIAGNOSIS — E1122 Type 2 diabetes mellitus with diabetic chronic kidney disease: Secondary | ICD-10-CM | POA: Insufficient documentation

## 2021-02-21 DIAGNOSIS — Z79899 Other long term (current) drug therapy: Secondary | ICD-10-CM | POA: Insufficient documentation

## 2021-02-21 DIAGNOSIS — R339 Retention of urine, unspecified: Secondary | ICD-10-CM | POA: Insufficient documentation

## 2021-02-21 LAB — COMPREHENSIVE METABOLIC PANEL
ALT: 13 U/L (ref 0–44)
AST: 24 U/L (ref 15–41)
Albumin: 3.2 g/dL — ABNORMAL LOW (ref 3.5–5.0)
Alkaline Phosphatase: 76 U/L (ref 38–126)
Anion gap: 13 (ref 5–15)
BUN: 39 mg/dL — ABNORMAL HIGH (ref 6–20)
CO2: 26 mmol/L (ref 22–32)
Calcium: 8.8 mg/dL — ABNORMAL LOW (ref 8.9–10.3)
Chloride: 96 mmol/L — ABNORMAL LOW (ref 98–111)
Creatinine, Ser: 5.7 mg/dL — ABNORMAL HIGH (ref 0.61–1.24)
GFR, Estimated: 11 mL/min — ABNORMAL LOW (ref >60)
Glucose, Bld: 79 mg/dL (ref 70–99)
Potassium: 4.4 mmol/L (ref 3.5–5.1)
Sodium: 135 mmol/L (ref 135–145)
Total Bilirubin: 1.1 mg/dL (ref 0.3–1.2)
Total Protein: 6.9 g/dL (ref 6.5–8.1)

## 2021-02-21 LAB — CBC WITH DIFFERENTIAL/PLATELET
Abs Immature Granulocytes: 0.04 10*3/uL (ref 0.00–0.07)
Basophils Absolute: 0 10*3/uL (ref 0.0–0.1)
Basophils Relative: 1 %
Eosinophils Absolute: 0.4 10*3/uL (ref 0.0–0.5)
Eosinophils Relative: 5 %
HCT: 30.1 % — ABNORMAL LOW (ref 39.0–52.0)
Hemoglobin: 9.7 g/dL — ABNORMAL LOW (ref 13.0–17.0)
Immature Granulocytes: 1 %
Lymphocytes Relative: 8 %
Lymphs Abs: 0.6 10*3/uL — ABNORMAL LOW (ref 0.7–4.0)
MCH: 35.3 pg — ABNORMAL HIGH (ref 26.0–34.0)
MCHC: 32.2 g/dL (ref 30.0–36.0)
MCV: 109.5 fL — ABNORMAL HIGH (ref 80.0–100.0)
Monocytes Absolute: 0.6 10*3/uL (ref 0.1–1.0)
Monocytes Relative: 8 %
Neutro Abs: 6.5 10*3/uL (ref 1.7–7.7)
Neutrophils Relative %: 77 %
Platelets: 249 10*3/uL (ref 150–400)
RBC: 2.75 MIL/uL — ABNORMAL LOW (ref 4.22–5.81)
RDW: 19.7 % — ABNORMAL HIGH (ref 11.5–15.5)
WBC: 8.3 10*3/uL (ref 4.0–10.5)
nRBC: 0 % (ref 0.0–0.2)

## 2021-02-21 LAB — CBG MONITORING, ED: Glucose-Capillary: 118 mg/dL — ABNORMAL HIGH (ref 70–99)

## 2021-02-21 IMAGING — DX DG ABDOMEN 1V
2 series · 2 of 2 positions shown · non-contrast
Comparison: None.

CLINICAL DATA: Concern for obstruction.

EXAM:
ABDOMEN - 1 VIEW

[abdomen supine grid (1 of 2)]
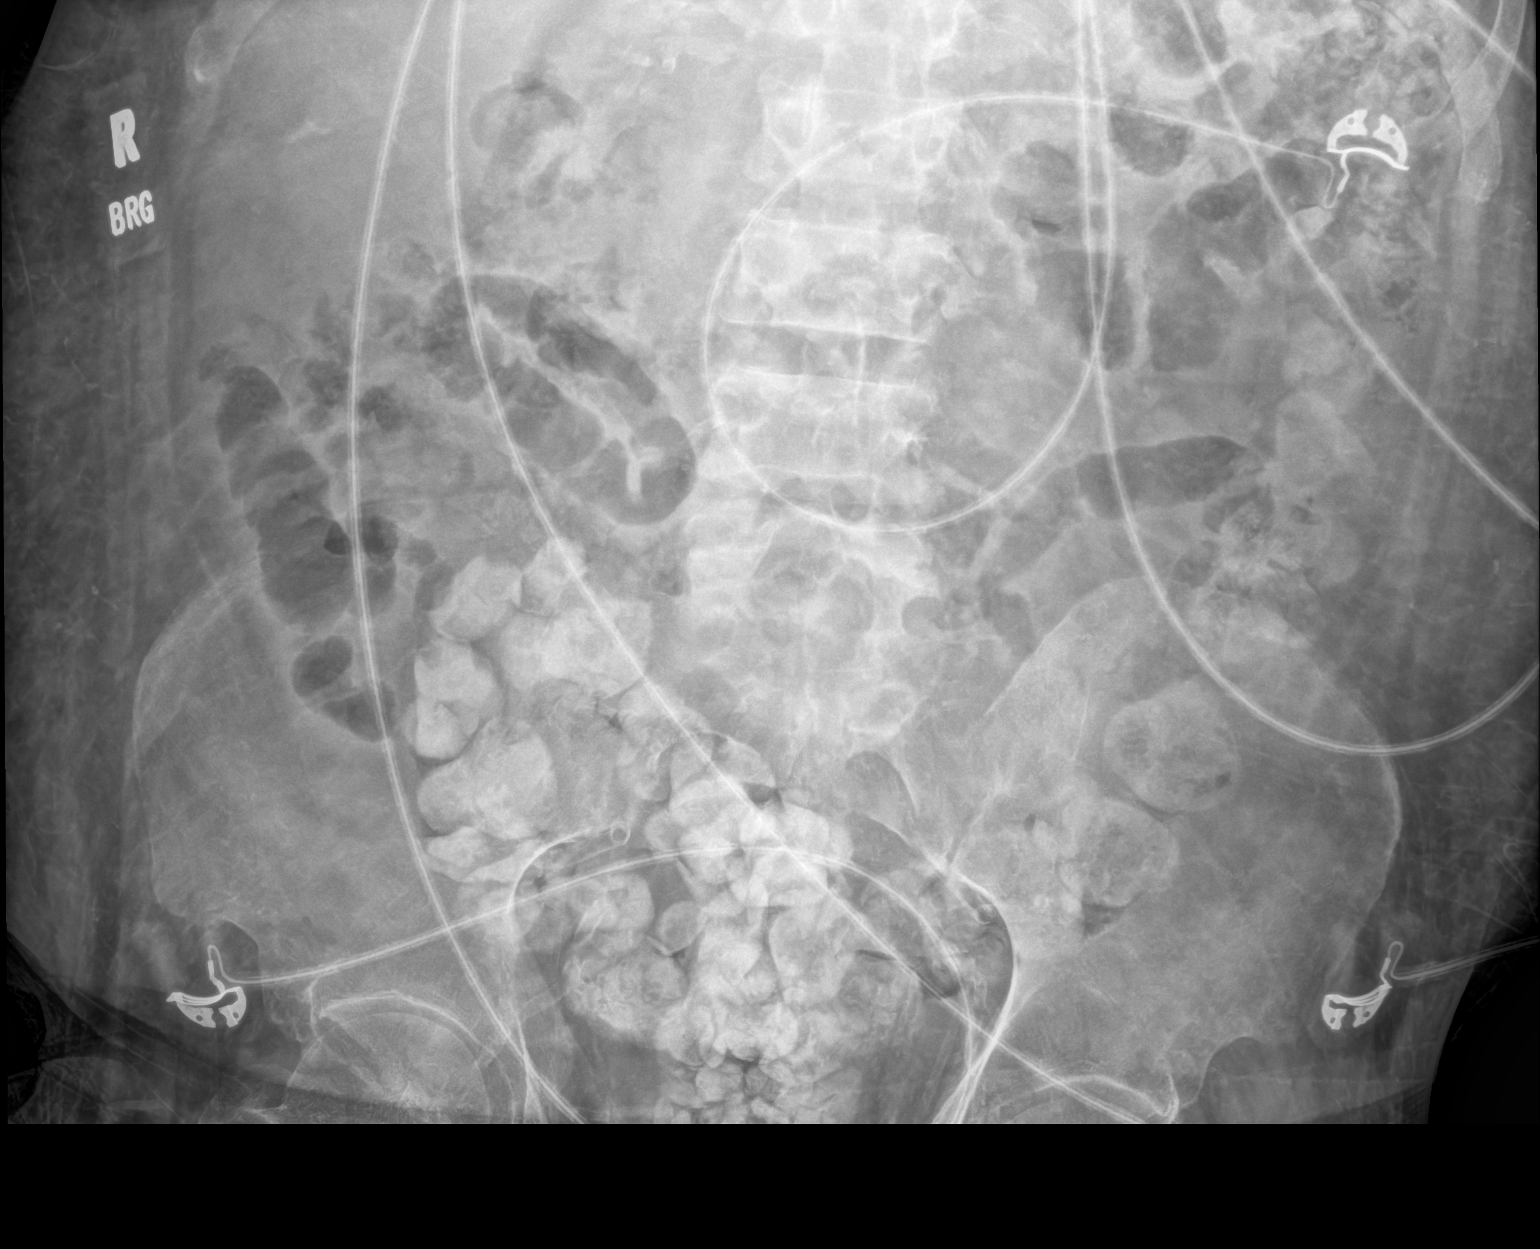

[abdomen supine grid (2 of 2)]
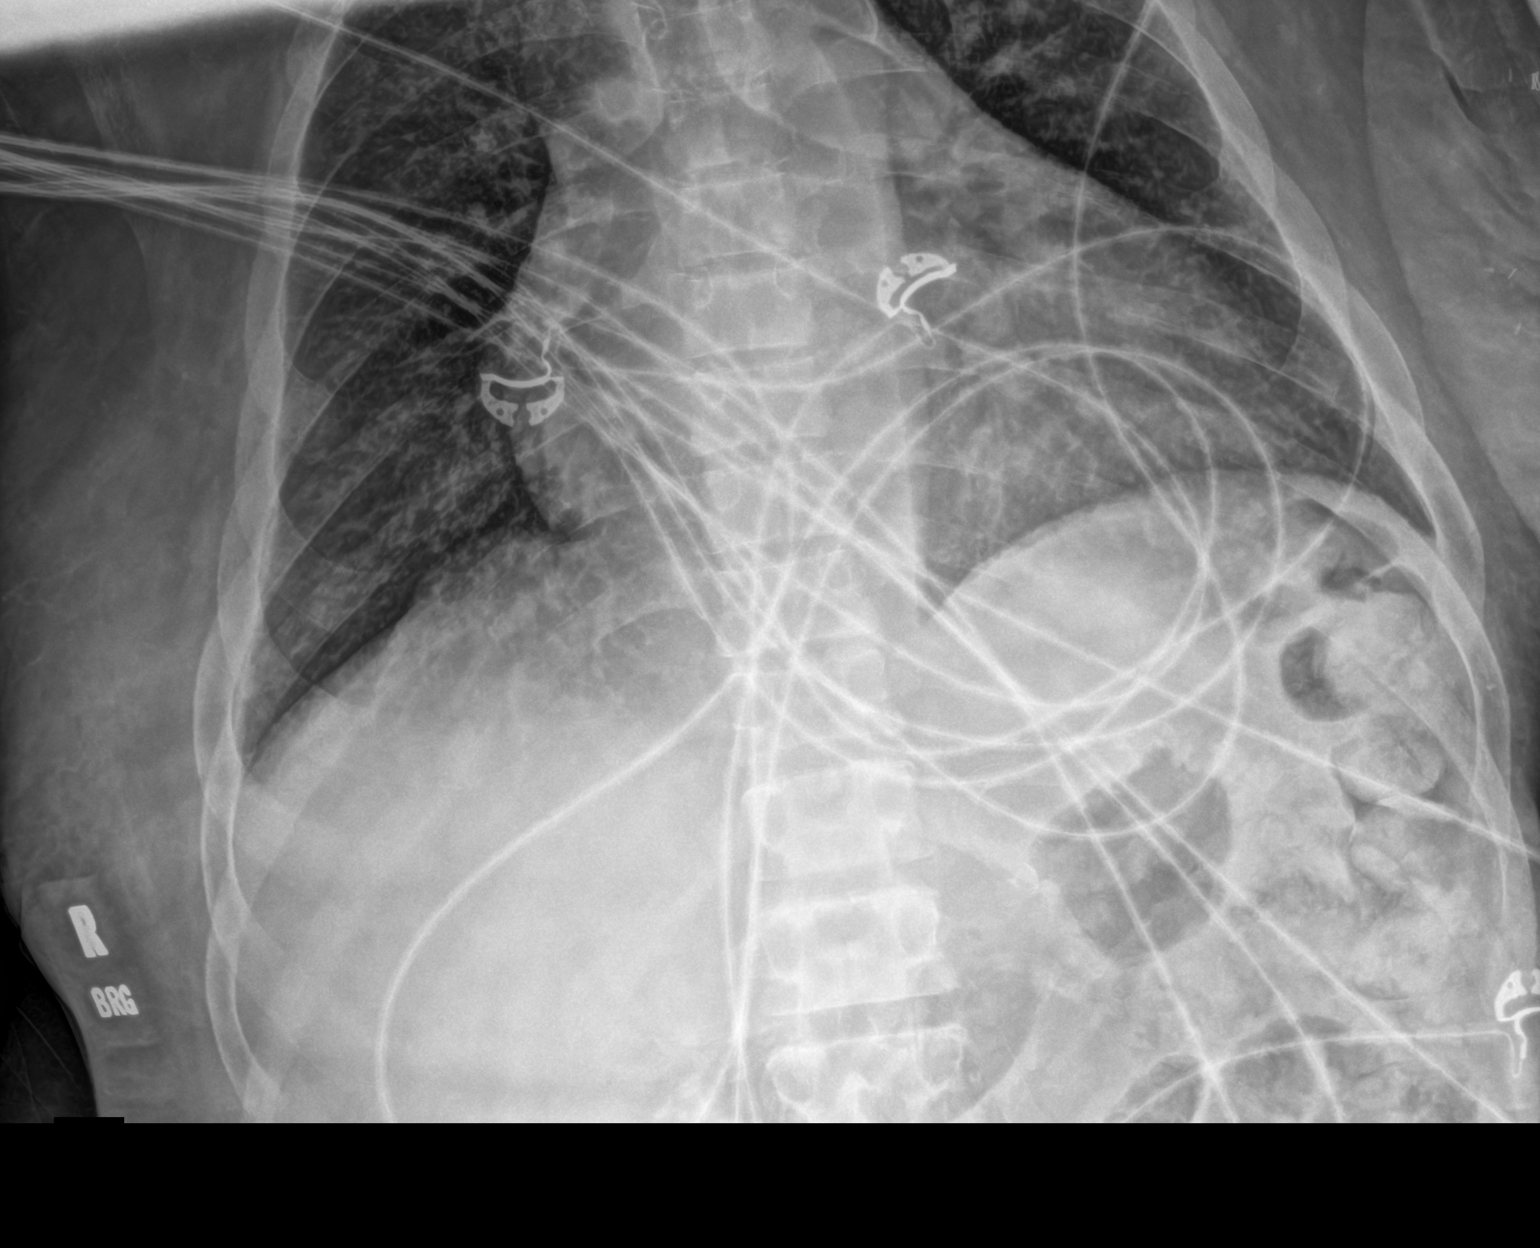

[2 of 2 positions shown; findings below may reference images not displayed]

FINDINGS: Nonobstructive bowel gas pattern. Formed stool in the left colon and
rectum. No evidence of free air on this supine radiograph.
IMPRESSION: Nonobstructive bowel gas pattern.

## 2021-02-21 MED ORDER — CEPHALEXIN 500 MG PO CAPS
500.0000 mg | ORAL_CAPSULE | Freq: Two times a day (BID) | ORAL | Status: DC
  Administered 2021-02-21: 500 mg via ORAL
  Filled 2021-02-21: qty 1

## 2021-02-21 MED ORDER — OXYCODONE HCL 5 MG PO TABS
5.0000 mg | ORAL_TABLET | Freq: Four times a day (QID) | ORAL | Status: DC | PRN
  Administered 2021-02-21 – 2021-02-22 (×2): 5 mg via ORAL
  Filled 2021-02-21 (×2): qty 1

## 2021-02-21 MED ORDER — VANCOMYCIN HCL IN DEXTROSE 1-5 GM/200ML-% IV SOLN: 1000.0000 mg | INTRAVENOUS | Status: DC

## 2021-02-21 MED ORDER — ASPIRIN 81 MG PO CHEW
81.0000 mg | CHEWABLE_TABLET | Freq: Every day | ORAL | Status: DC
  Filled 2021-02-21: qty 1

## 2021-02-21 MED ORDER — SORBITOL 70 % SOLN
960.0000 mL | TOPICAL_OIL | Freq: Once | ORAL | Status: AC
  Administered 2021-02-21: 960 mL via RECTAL
  Filled 2021-02-21: qty 473

## 2021-02-21 MED ORDER — CALCIUM ACETATE (PHOS BINDER) 667 MG PO CAPS
667.0000 mg | ORAL_CAPSULE | Freq: Three times a day (TID) | ORAL | Status: DC
  Administered 2021-02-21: 667 mg via ORAL
  Filled 2021-02-21: qty 1

## 2021-02-21 MED ORDER — ATORVASTATIN CALCIUM 10 MG PO TABS
10.0000 mg | ORAL_TABLET | Freq: Every evening | ORAL | Status: DC
  Administered 2021-02-21: 10 mg via ORAL

## 2021-02-21 MED ORDER — ASPIRIN 81 MG PO CHEW: 81.0000 mg | CHEWABLE_TABLET | Freq: Every day | ORAL | Status: DC

## 2021-02-21 MED ORDER — DIPHENHYDRAMINE HCL 25 MG PO CAPS
25.0000 mg | ORAL_CAPSULE | Freq: Once | ORAL | Status: AC
  Administered 2021-02-21: 25 mg via ORAL
  Filled 2021-02-21: qty 1

## 2021-02-21 MED ORDER — ALBUTEROL SULFATE HFA 108 (90 BASE) MCG/ACT IN AERS
2.0000 | INHALATION_SPRAY | RESPIRATORY_TRACT | Status: DC | PRN
  Filled 2021-02-21: qty 6.7

## 2021-02-21 MED ORDER — APIXABAN 5 MG PO TABS
5.0000 mg | ORAL_TABLET | Freq: Two times a day (BID) | ORAL | Status: DC
  Administered 2021-02-21: 5 mg via ORAL
  Filled 2021-02-21: qty 1

## 2021-02-21 NOTE — ED Notes (Addendum)
Pt threw bedpan and urinal against door of room, pt threw fecal balls onto floor throughout room. Feces smeared on bedrails, foot stool and chair in room. Pt has thrown numerous tissues with feces all over room. This nurse told patient that it is unacceptable to throw feces in room and if assistance is needed he should contact staff. Security notified and in to speak with patient.  While cleaning patient and changing patients linens which were smeared with feces, the sound of pills were heard in pts pillow case. Pt had 2 bottles of pills, Oxycodone 5mg  tabs #26 and Cephalexin capsules. Medication counted with Herb, Security, and placed in a security bag. Receipt at nurses station. Pt kept his wallet and contents were not searched. Pt also has his clothing and personal pillow and pillow case.

## 2021-02-21 NOTE — ED Notes (Signed)
RN, primary RN, NT, and security in to room to educate with patient that these behaviors are not acceptable. Educated that he is a grown adult in his right mind there is no need to be throwing and slinging pieces of feces all over the room. Educated that it is an infection risk and it will not be tolerated. Pt is being held here as a favor since he refused rehab prior to coming here. Verbalized understanding and apologies.

## 2021-02-21 NOTE — ED Triage Notes (Addendum)
Pt brought in by [facility] from home with c/o pain in left leg radiating up to back, constipation and urinary retention since coming home from the hospital Thursday. Pt had dialysis on Friday and reports he usually urinates some everyday. 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   Pt also reports he feels he needs to go to rehab. He said his doctor wanted him to go to rehab after his surgery but he didn't want to go at that time, but now he feels he needs it.

## 2021-02-21 NOTE — ED Notes (Addendum)
Pt has circular maculopapular crusty scabbed rash with red base all over body, less lesions on face than remainder of body. Pt reports he had it when he was at [facility] and "they couldn't figure out what it was". Per chart review:  Noted:  02/13/2021  Last Assessment & Plan 02/12/2021 Hospital Encounter Written 02/13/2021  1:28 PM by Dwyane Dee, MD   - diffuse rash/papules on back and scattered. Appears probably due to staph colonization and esrd - can consider outpt referral to derm

## 2021-02-21 NOTE — ED Notes (Signed)
Provided pt with ice water with meds and given two blankets. Pt denies any other needs at this time.

## 2021-02-21 NOTE — ED Provider Notes (Signed)
[facility] EMERGENCY DEPARTMENT Provider Note  CSN: [id number] Arrival date & time: 02/21/21 1157  Chief Complaint(s) Constipation and Urinary Retention  HPI Brendan Campbell is a 51 y.o. 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  Patient states that he initially did not want to go to rehab and try to do things at home but he returns with a desire to go to rehab as he has trouble getting to the bathroom and back.  He also states that since using the pain medication for his BKA he has been unable to pass stool.  He denies fever, chest pain, shortness of breath, or other systemic symptoms.   Constipation  Past Medical History Past Medical History:  Diagnosis Date   Anemia    Anxiety    Blood transfusion without reported diagnosis    CHF (congestive heart failure) ([place])    a. EF 25% by echo in 07/2019   Chronic kidney disease    Depression    Diabetes mellitus without complication ([place])    End stage renal disease ([place])    M/W/F [facility]   Hyperlipidemia    Neuropathy    Peripheral vascular disease (HCC)    PTSD (post-traumatic stress disorder)    Patient Active Problem List   Diagnosis Date Noted   MRSA bacteremia 02/13/2021   Rash 02/13/2021   Lower extremity cellulitis 58/52/7782   Chronic systolic CHF (congestive heart failure) ([place]) 07/27/2020   Volume overload 07/20/2020   COVID-19 virus infection 07/20/2020   Acute on chronic respiratory failure with hypoxia ([place]) 07/03/2020   Acute on chronic combined systolic and diastolic CHF (congestive heart failure) ([place]) 04/08/2020   Left hemiparesis ([place]) 04/08/2020   Lumbar spondylosis 04/08/2020   Atrial fibrillation with RVR ([place]) 04/08/2020   Pain in limb 12/29/2019   Cervical radiculopathy 12/29/2019   Acute congestive heart failure ([place]) 12/13/2019   Paresthesia 09/24/2019    Hypocalcemia    Chronic atrial fibrillation ([place])    Hyperparathyroidism ([place])    Cardiac arrest ([place]) 06/07/2019   Neuropathy    Peripheral vascular disease ([place])    ESRD on hemodialysis ([place])    Hyperkalemia 10/05/2018   Spondylosis of lumbar spine 03/08/2016   Lumbar radiculopathy 03/08/2016   S/P below knee amputation, right ([place]) 03/05/2015   Type 2 diabetes mellitus with diabetic chronic kidney disease ([place]) 03/03/2015   Hyperlipidemia 03/03/2015   Low back pain 03/03/2015   Home Medication(s) Prior to Admission medications   Medication Sig Start Date End Date Taking? Authorizing Provider  acetaminophen (TYLENOL) 325 MG tablet Take 2 tablets (650 mg total) by mouth every 6 (six) hours as needed for mild pain, moderate pain or fever. 02/06/21   Cherene Altes, MD  albuterol (VENTOLIN HFA) 108 (90 Base) MCG/ACT inhaler Inhale 2 puffs into the lungs every 4 (four) hours as needed for wheezing or shortness of breath. 07/21/20   Manuella Ghazi, Pratik D, DO  apixaban (ELIQUIS) 5 MG TABS tablet Take 1 tablet (5 mg total) by mouth 2 (two) times daily. 08/23/19   Strader, Fransisco Hertz, PA-C  aspirin 81 MG chewable tablet Chew 81 mg by mouth daily.    [provider]  atorvastatin (LIPITOR) 10 MG tablet Take 1 tablet (10 mg total) by mouth every evening. Patient taking differently: Take 10 mg by mouth every evening. Take 1 tablet on Monday, Wednesday and  Friday. 08/03/19   Barton Dubois, MD  butalbital-acetaminophen-caffeine (FIORICET) [id number] MG tablet Take 1 tablet by mouth every 6 (six) hours as needed for headache or migraine. 07/21/20   Manuella Ghazi, Pratik D, DO  calcium acetate (PHOSLO) 667 MG capsule Take 1 capsule (667 mg total) by mouth 3 (three) times daily with meals. 02/21/19   Donnamae Jude, MD  calcium carbonate (TUMS - DOSED IN MG ELEMENTAL CALCIUM) 500 MG chewable tablet Chew 4 tablets (800 mg of elemental calcium total) by mouth 2 (two) times daily. 08/03/19   Barton Dubois, MD   carvedilol (COREG) 3.125 MG tablet Take 1 tablet (3.125 mg total) by mouth 2 (two) times daily with a meal. Patient not taking: Reported on 12/07/2020 04/09/20 11/26/20  Deatra Kendarrius, MD  cephALEXin (KEFLEX) 500 MG capsule Take 1 capsule (500 mg total) by mouth every 12 (twelve) hours for 17 days. 02/19/21 03/08/21  Antonieta Pert, MD  Cholecalciferol (VITAMIN D3) 25 MCG (1000 UT) CAPS Take 1 capsule by mouth See admin instructions. Take before Dialysis on Monday Wednesday and Fridays    [provider]  oxyCODONE (OXY IR/ROXICODONE) 5 MG immediate release tablet Take 1 tablet (5 mg total) by mouth every 6 (six) hours as needed for severe pain. 02/19/21   Rhyne, Hulen Shouts, PA-C  pantoprazole (PROTONIX) 20 MG tablet Take 1 tablet (20 mg total) by mouth daily. 11/26/20   Milton Ferguson, MD  patiromer (VELTASSA) 8.4 g packet Take 1 packet (8.4 g total) by mouth 3 (three) times a week. On Tuesday, Thursday and Saturday Patient not taking: Reported on 02/12/2021 06/10/19   Kathie Dike, MD  vancomycin [facility]) 1-5 GM/200ML-% SOLN Inject 200 mLs (1,000 mg total) into the vein every Monday, Wednesday, and Friday with hemodialysis for 24 days. 02/19/21 03/15/21  Antonieta Pert, MD                                                                                                                                    Past Surgical History Past Surgical History:  Procedure Laterality Date   A/V FISTULAGRAM N/A 10/09/2018   Procedure: A/V FISTULAGRAM;  Surgeon: Serafina Mitchell, MD;  Location: [place] [facility];  Service: Cardiovascular;  Laterality: N/A;   AMPUTATION Left 02/16/2021   Procedure: AMPUTATION BELOW KNEE;  Surgeon: Broadus John, MD;  Location: [place];  Service: Vascular;  Laterality: Left;   AV FISTULA PLACEMENT Left 09/22/2016   Procedure: CREATION OF LEFT ARM ARTERIOVENOUS (AV) FISTULA;  Surgeon: Waynetta Sandy, MD;  Location: [place];  Service: Vascular;  Laterality: Left;   AV FISTULA  PLACEMENT Left 10/31/2017   Procedure: INSERTION OF ARTERIOVENOUS (AV) GORE-TEX 4-23mm STETCH GRAFT LEFT ARM;  Surgeon: Waynetta Sandy, MD;  Location: [place];  Service: Vascular;  Laterality: Left;   [place] Left 08/17/2017   Procedure: SECOND STAGE BASILIC VEIN TRANSPOSITION LEFT ARM;  Surgeon: Servando Snare  Harrell Gave, MD;  Location: [place];  Service: Vascular;  Laterality: Left;   BELOW KNEE LEG AMPUTATION Right    CARDIOVERSION N/A 02/19/2019   Procedure: CARDIOVERSION;  Surgeon: [name], MD;  [facility] ENDOSCOPY;  Service: Cardiovascular;  Laterality: N/A;   CATARACT EXTRACTION W/PHACO Right 10/23/2020   Procedure: CATARACT EXTRACTION PHACO AND INTRAOCULAR LENS PLACEMENT ([place]);  Surgeon: Baruch Goldmann, MD;  Location: [facility];  Service: Ophthalmology;  Laterality: Right;  CDE 39.03   CHOLECYSTECTOMY     FOOT SURGERY     IR FLUORO GUIDE CV LINE RIGHT  05/16/2016   IR FLUORO GUIDE CV LINE RIGHT  02/16/2019   IR REMOVAL TUN CV CATH W/O FL  05/16/2016   IR REMOVAL TUN CV CATH W/O FL  02/19/2019   IR THROMBECTOMY AV FISTULA W/THROMBOLYSIS/PTA INC/SHUNT/IMG LEFT Left 11/09/2018   IR THROMBECTOMY AV FISTULA W/THROMBOLYSIS/PTA INC/SHUNT/IMG LEFT Left 06/22/2019   IR US GUIDE VASC ACCESS LEFT  11/09/2018   IR US GUIDE VASC ACCESS LEFT  06/22/2019   IR US GUIDE VASC ACCESS RIGHT  05/16/2016   IR US GUIDE VASC ACCESS RIGHT  02/16/2019   PERIPHERAL VASCULAR BALLOON ANGIOPLASTY  10/09/2018   Procedure: PERIPHERAL VASCULAR BALLOON ANGIOPLASTY;  Surgeon: Serafina Mitchell, MD;  Location: [place] [facility];  Service: Cardiovascular;;   TEE WITHOUT CARDIOVERSION N/A 02/19/2019   Procedure: TRANSESOPHAGEAL ECHOCARDIOGRAM (TEE);  Surgeon: [name], MD;  Location: [facility] ENDOSCOPY;  Service: Cardiovascular;  Laterality: N/A;   Family History Family History  Problem Relation Age of Onset   Cancer Mother        lung   Diabetes Mother    Heart attack Father    Diabetes Father     Diabetes Sister     Social History Social History   Tobacco Use   Smoking status: Former    Types: Cigarettes    Quit date: 09/03/2006    Years since quitting: 14.4   Smokeless tobacco: Never  Vaping Use   Vaping Use: Never used  Substance Use Topics   Alcohol use: No   Drug use: No   Allergies Tape and Chlorhexidine gluconate [chlorhexidine]  Review of Systems Review of Systems  Gastrointestinal:  Positive for constipation.   Physical Exam Vital Signs  I have reviewed the triage vital signs BP (!) 119/91    Pulse 96    Temp 97.8 F (36.6 C) (Oral)    Resp 20    Ht 5\' 9"  (1.753 m)    Wt 99.8 kg    SpO2 94%    BMI 32.49 kg/m   Physical Exam Vitals and nursing note reviewed.  Constitutional:      General: He is not in acute distress.    Appearance: He is well-developed.  HENT:     Head: Normocephalic and atraumatic.  Eyes:     Conjunctiva/sclera: Conjunctivae normal.  Cardiovascular:     Rate and Rhythm: Normal rate and regular rhythm.     Heart sounds: No murmur heard. Pulmonary:     Effort: Pulmonary effort is normal. No respiratory distress.     Breath sounds: Normal breath sounds.  Abdominal:     General: There is distension.     Palpations: Abdomen is soft.     Tenderness: There is no abdominal tenderness.  Musculoskeletal:        General: No swelling.     Cervical back: Neck supple.     Comments: BKA wounds clean dry and intact with no  evidence of infection  Skin:    General: Skin is warm and dry.     Capillary Refill: Capillary refill takes less than 2 seconds.  Neurological:     Mental Status: He is alert.  Psychiatric:        Mood and Affect: Mood normal.    ED Results and Treatments Labs (all labs ordered are listed, but only abnormal results are displayed) Labs Reviewed  CBC WITH DIFFERENTIAL/PLATELET - Abnormal; Notable for the following components:      Result Value   RBC 2.75 (*)    Hemoglobin 9.7 (*)    HCT 30.1 (*)    MCV 109.5 (*)     MCH 35.3 (*)    RDW 19.7 (*)    Lymphs Abs 0.6 (*)    All other components within normal limits  COMPREHENSIVE METABOLIC PANEL                                                                                                                          Radiology DG Abdomen 1 View  Result Date: 02/21/2021 CLINICAL DATA:  Concern for obstruction. EXAM: ABDOMEN - 1 VIEW COMPARISON:  None. FINDINGS: Nonobstructive bowel gas pattern. Formed stool in the left colon and rectum. No evidence of free air on this supine radiograph. IMPRESSION: Nonobstructive bowel gas pattern. Electronically Signed   By: Fidela Salisbury M.D.   On: 02/21/2021 13:32    Pertinent labs & imaging results that were available during my care of the patient were reviewed by me and considered in my medical decision making (see MDM for details).  Medications Ordered in ED Medications  sorbitol, milk of mag, mineral oil, glycerin (SMOG) enema (has no administration in time range)                                                                                                                                     Procedures Procedures  (including critical care time)  Medical Decision Making / ED Course   This patient presents to the ED for concern of constipation, this involves an extensive number of treatment options, and is a complaint that carries with it a high risk of complications and morbidity.  The differential diagnosis includes opioid-induced constipation, ileus, SBO  MDM: Patient seen emergency department for evaluation of abdominal distention and constipation as well as a desire to go to rehab.  Physical  exam reveals a recent BKA vision on the left that is clean dry and intact with no evidence of infection.  Laboratory evaluation with a BUN of 39, creatinine 5.7 consistent with his ESRD.  Hemoglobin is 9.7, no significant leukocytosis.  X-ray of the abdomen with no obstructive stool pattern.  An enema was ordered  for the patient and [facility] consult was placed for the patient to go to skilled nursing.  Patient then signed out to oncoming provider.  Please see provider signout for continuation of work-up.   Additional history obtained:  -External records from outside source obtained and reviewed including: Chart review including previous notes, labs, imaging, consultation notes   Lab Tests: -I ordered, reviewed, and interpreted labs.   The pertinent results include:   Labs Reviewed  CBC WITH DIFFERENTIAL/PLATELET - Abnormal; Notable for the following components:      Result Value   RBC 2.75 (*)    Hemoglobin 9.7 (*)    HCT 30.1 (*)    MCV 109.5 (*)    MCH 35.3 (*)    RDW 19.7 (*)    Lymphs Abs 0.6 (*)    All other components within normal limits  COMPREHENSIVE METABOLIC PANEL       Imaging Studies ordered: I ordered imaging studies including KUB I independently visualized and interpreted imaging. I agree with the radiologist interpretation   Medicines ordered and prescription drug management: Meds ordered this encounter  Medications   sorbitol, milk of mag, mineral oil, glycerin (SMOG) enema    -I have reviewed the patients home medicines and have made adjustments as needed   Cardiac Monitoring: The patient was maintained on a cardiac monitor.  I personally viewed and interpreted the cardiac monitored which showed an underlying rhythm of: NSR  Social Determinants of Health:  Factors impacting patients care include: Inability to care for oneself at home, limited mobility   Reevaluation: After the interventions noted above, I reevaluated the patient and found that they have :improved  Co morbidities that complicate the patient evaluation  Past Medical History:  Diagnosis Date   Anemia    Anxiety    Blood transfusion without reported diagnosis    CHF (congestive heart failure) ([place])    a. EF 25% by echo in 07/2019   Chronic kidney disease    Depression    Diabetes mellitus  without complication ([place])    End stage renal disease ([place])    M/W/F [facility]   Hyperlipidemia    Neuropathy    Peripheral vascular disease ([place])    PTSD (post-traumatic stress disorder)       Dispostion: Pending TOC     Final Clinical Impression(s) / ED Diagnoses Final diagnoses:  None     @PCDICTATION @    Teressa Lower, MD 02/21/21 1641

## 2021-02-22 MED ORDER — DIPHENHYDRAMINE HCL 25 MG PO CAPS
50.0000 mg | ORAL_CAPSULE | Freq: Once | ORAL | Status: AC
  Administered 2021-02-22: 50 mg via ORAL
  Filled 2021-02-22: qty 2

## 2021-02-22 MED ORDER — VANCOMYCIN HCL IN DEXTROSE 1-5 GM/200ML-% IV SOLN: 1000.0000 mg | INTRAVENOUS | Status: DC

## 2021-02-22 MED ORDER — CHLORHEXIDINE GLUCONATE CLOTH 2 % EX PADS: 6.0000 | MEDICATED_PAD | Freq: Every day | CUTANEOUS | Status: DC

## 2021-02-22 NOTE — Discharge Instructions (Signed)
Patient will be sent directly over dialysis at 1115 this morning

## 2021-02-22 NOTE — ED Notes (Signed)
[facility] Per Selena Batten will wait for pt to get there at 12pm to receive dialysis.

## 2021-02-22 NOTE — ED Notes (Signed)
Pt is being discharged to go to [facility].

## 2021-02-22 NOTE — TOC Transition Note (Signed)
TOC has reviewed pts chart and per nursing notes from last night pt will not be a SNF placement candidate. Per nursing note "Pt threw bedpan and urinal against door of room, pt threw fecal balls onto floor throughout room. Feces smeared on bedrails, foot stool and chair in room. Pt has thrown numerous tissues with feces all over room. This nurse told patient that it is unacceptable to throw feces in room and if assistance is needed he should contact staff. Security notified and in to speak with patient. While cleaning patient and changing patients linens which were smeared with feces, the sound of pills were heard in pts pillow case. Pt had 2 bottles of pills, Oxycodone 5mg  tabs #26 and Cephalexin capsules."  Pt has already been established with a [place] agency after his most recent hospital admission. Per TOC note from last admission  pts roommate and roommates son had agreed to a plan for caregiving as pt did not wish to go to rehab.   Pt gets dialysis MWF at 11:15. CSW updated ED staff of this. ED can set up transportation when pt is medically stable for d/c.

## 2021-03-10 ENCOUNTER — Inpatient Hospital Stay (HOSPITAL_COMMUNITY)
Admission: EM | Admit: 2021-03-10 | Discharge: 2021-03-12 | DRG: 291 | Disposition: A | Discharge status: Home / Self Care | Payer: Medicaid Other | Attending: Family Medicine | Admitting: Family Medicine

## 2021-03-10 ENCOUNTER — Other Ambulatory Visit: Payer: Self-pay

## 2021-03-10 ENCOUNTER — Encounter (HOSPITAL_COMMUNITY): Payer: Self-pay

## 2021-03-10 ENCOUNTER — Emergency Department (HOSPITAL_COMMUNITY): Payer: Medicaid Other

## 2021-03-10 DIAGNOSIS — N186 End stage renal disease: Secondary | ICD-10-CM | POA: Diagnosis not present

## 2021-03-10 DIAGNOSIS — Z5320 Procedure and treatment not carried out because of patient's decision for unspecified reasons: Secondary | ICD-10-CM | POA: Diagnosis present

## 2021-03-10 DIAGNOSIS — Z961 Presence of intraocular lens: Secondary | ICD-10-CM | POA: Diagnosis present

## 2021-03-10 DIAGNOSIS — E213 Hyperparathyroidism, unspecified: Secondary | ICD-10-CM | POA: Diagnosis present

## 2021-03-10 DIAGNOSIS — Z89512 Acquired absence of left leg below knee: Secondary | ICD-10-CM

## 2021-03-10 DIAGNOSIS — I5022 Chronic systolic (congestive) heart failure: Secondary | ICD-10-CM | POA: Diagnosis present

## 2021-03-10 DIAGNOSIS — Z20822 Contact with and (suspected) exposure to covid-19: Secondary | ICD-10-CM | POA: Diagnosis present

## 2021-03-10 DIAGNOSIS — E1143 Type 2 diabetes mellitus with diabetic autonomic (poly)neuropathy: Secondary | ICD-10-CM | POA: Diagnosis present

## 2021-03-10 DIAGNOSIS — Z23 Encounter for immunization: Secondary | ICD-10-CM

## 2021-03-10 DIAGNOSIS — E1122 Type 2 diabetes mellitus with diabetic chronic kidney disease: Secondary | ICD-10-CM | POA: Diagnosis present

## 2021-03-10 DIAGNOSIS — Z9841 Cataract extraction status, right eye: Secondary | ICD-10-CM

## 2021-03-10 DIAGNOSIS — M898X9 Other specified disorders of bone, unspecified site: Secondary | ICD-10-CM | POA: Diagnosis present

## 2021-03-10 DIAGNOSIS — R112 Nausea with vomiting, unspecified: Secondary | ICD-10-CM | POA: Diagnosis not present

## 2021-03-10 DIAGNOSIS — E782 Mixed hyperlipidemia: Secondary | ICD-10-CM | POA: Diagnosis not present

## 2021-03-10 DIAGNOSIS — I739 Peripheral vascular disease, unspecified: Secondary | ICD-10-CM | POA: Diagnosis present

## 2021-03-10 DIAGNOSIS — Z992 Dependence on renal dialysis: Secondary | ICD-10-CM

## 2021-03-10 DIAGNOSIS — I482 Chronic atrial fibrillation, unspecified: Secondary | ICD-10-CM | POA: Diagnosis present

## 2021-03-10 DIAGNOSIS — Z515 Encounter for palliative care: Secondary | ICD-10-CM

## 2021-03-10 DIAGNOSIS — I132 Hypertensive heart and chronic kidney disease with heart failure and with stage 5 chronic kidney disease, or end stage renal disease: Principal | ICD-10-CM | POA: Diagnosis present

## 2021-03-10 DIAGNOSIS — E785 Hyperlipidemia, unspecified: Secondary | ICD-10-CM | POA: Diagnosis present

## 2021-03-10 DIAGNOSIS — K3184 Gastroparesis: Secondary | ICD-10-CM | POA: Diagnosis present

## 2021-03-10 DIAGNOSIS — F4310 Post-traumatic stress disorder, unspecified: Secondary | ICD-10-CM | POA: Diagnosis present

## 2021-03-10 DIAGNOSIS — Z833 Family history of diabetes mellitus: Secondary | ICD-10-CM

## 2021-03-10 DIAGNOSIS — Z91199 Patient's noncompliance with other medical treatment and regimen due to unspecified reason: Secondary | ICD-10-CM

## 2021-03-10 DIAGNOSIS — Z7901 Long term (current) use of anticoagulants: Secondary | ICD-10-CM

## 2021-03-10 DIAGNOSIS — Z9115 Patient's noncompliance with renal dialysis: Secondary | ICD-10-CM

## 2021-03-10 DIAGNOSIS — Z91048 Other nonmedicinal substance allergy status: Secondary | ICD-10-CM

## 2021-03-10 DIAGNOSIS — Z8249 Family history of ischemic heart disease and other diseases of the circulatory system: Secondary | ICD-10-CM

## 2021-03-10 DIAGNOSIS — Z79899 Other long term (current) drug therapy: Secondary | ICD-10-CM

## 2021-03-10 DIAGNOSIS — Z89511 Acquired absence of right leg below knee: Secondary | ICD-10-CM

## 2021-03-10 DIAGNOSIS — Z9049 Acquired absence of other specified parts of digestive tract: Secondary | ICD-10-CM

## 2021-03-10 DIAGNOSIS — R54 Age-related physical debility: Secondary | ICD-10-CM | POA: Diagnosis present

## 2021-03-10 DIAGNOSIS — G629 Polyneuropathy, unspecified: Secondary | ICD-10-CM

## 2021-03-10 DIAGNOSIS — Z7982 Long term (current) use of aspirin: Secondary | ICD-10-CM

## 2021-03-10 DIAGNOSIS — F419 Anxiety disorder, unspecified: Secondary | ICD-10-CM | POA: Diagnosis present

## 2021-03-10 DIAGNOSIS — Z888 Allergy status to other drugs, medicaments and biological substances status: Secondary | ICD-10-CM

## 2021-03-10 DIAGNOSIS — D631 Anemia in chronic kidney disease: Secondary | ICD-10-CM | POA: Diagnosis present

## 2021-03-10 DIAGNOSIS — F32A Depression, unspecified: Secondary | ICD-10-CM | POA: Diagnosis present

## 2021-03-10 DIAGNOSIS — Z8614 Personal history of Methicillin resistant Staphylococcus aureus infection: Secondary | ICD-10-CM

## 2021-03-10 LAB — CBC
HCT: 32.3 % — ABNORMAL LOW (ref 39.0–52.0)
Hemoglobin: 10.2 g/dL — ABNORMAL LOW (ref 13.0–17.0)
MCH: 34 pg (ref 26.0–34.0)
MCHC: 31.6 g/dL (ref 30.0–36.0)
MCV: 107.7 fL — ABNORMAL HIGH (ref 80.0–100.0)
Platelets: 225 10*3/uL (ref 150–400)
RBC: 3 MIL/uL — ABNORMAL LOW (ref 4.22–5.81)
RDW: 19.3 % — ABNORMAL HIGH (ref 11.5–15.5)
WBC: 7.7 10*3/uL (ref 4.0–10.5)
nRBC: 1.2 % — ABNORMAL HIGH (ref 0.0–0.2)

## 2021-03-10 LAB — COMPREHENSIVE METABOLIC PANEL
ALT: 12 U/L (ref 0–44)
AST: 15 U/L (ref 15–41)
Albumin: 3 g/dL — ABNORMAL LOW (ref 3.5–5.0)
Alkaline Phosphatase: 87 U/L (ref 38–126)
Anion gap: 14 (ref 5–15)
BUN: 29 mg/dL — ABNORMAL HIGH (ref 6–20)
CO2: 24 mmol/L (ref 22–32)
Calcium: 8.6 mg/dL — ABNORMAL LOW (ref 8.9–10.3)
Chloride: 97 mmol/L — ABNORMAL LOW (ref 98–111)
Creatinine, Ser: 4.74 mg/dL — ABNORMAL HIGH (ref 0.61–1.24)
GFR, Estimated: 14 mL/min — ABNORMAL LOW (ref >60)
Glucose, Bld: 118 mg/dL — ABNORMAL HIGH (ref 70–99)
Potassium: 3.8 mmol/L (ref 3.5–5.1)
Sodium: 135 mmol/L (ref 135–145)
Total Bilirubin: 1.6 mg/dL — ABNORMAL HIGH (ref 0.3–1.2)
Total Protein: 6.7 g/dL (ref 6.5–8.1)

## 2021-03-10 LAB — TROPONIN I (HIGH SENSITIVITY)
Troponin I (High Sensitivity): 24 ng/L — ABNORMAL HIGH (ref <18)
Troponin I (High Sensitivity): 23 ng/L — ABNORMAL HIGH (ref <18)

## 2021-03-10 LAB — LIPASE, BLOOD: Lipase: 24 U/L (ref 11–51)

## 2021-03-10 LAB — RESP PANEL BY RT-PCR (FLU A&B, COVID) ARPGX2
Influenza A by PCR: NEGATIVE
Influenza B by PCR: NEGATIVE
SARS Coronavirus 2 by RT PCR: NEGATIVE

## 2021-03-10 LAB — MAGNESIUM: Magnesium: 2.2 mg/dL (ref 1.7–2.4)

## 2021-03-10 IMAGING — CT CT ABD-PELV W/O CM
2 of 4 series · 16 of 46 positions shown, 18 images · non-contrast
Comparison: None.

CLINICAL DATA: Nausea vomiting with abdominal pain.



[Series 2: axial st · axial · 0.98mm/px · z∈[+581,+1036]mm · 13 of 103 slices shown, 15 images]
[im 6/103  soft-tissue]
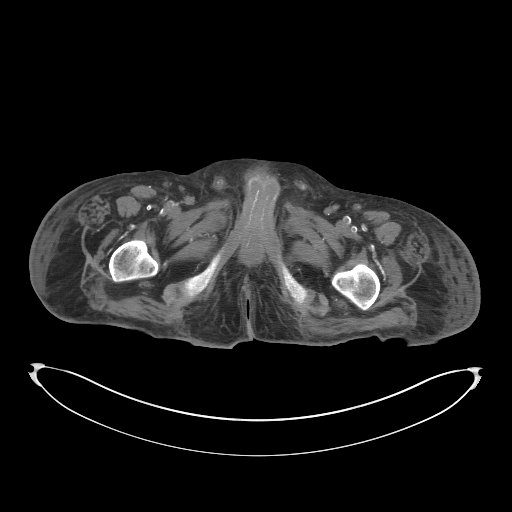
[im 6/103  bone]
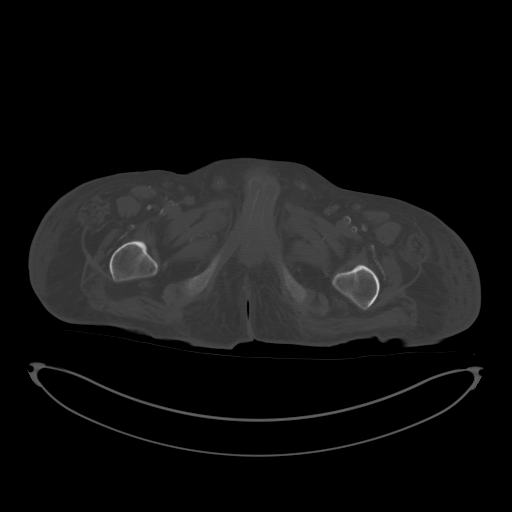
[im 12/103  soft-tissue]
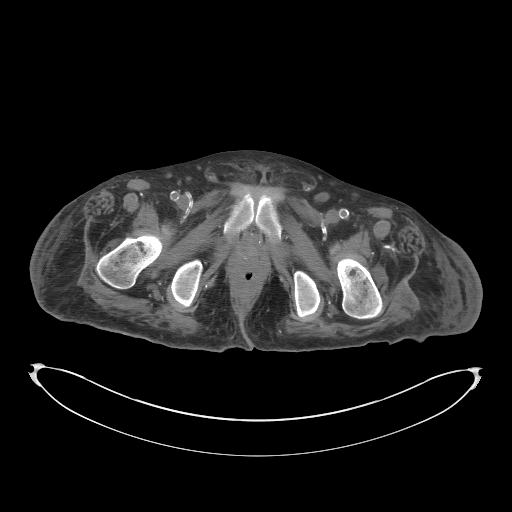
[im 23/103  soft-tissue]
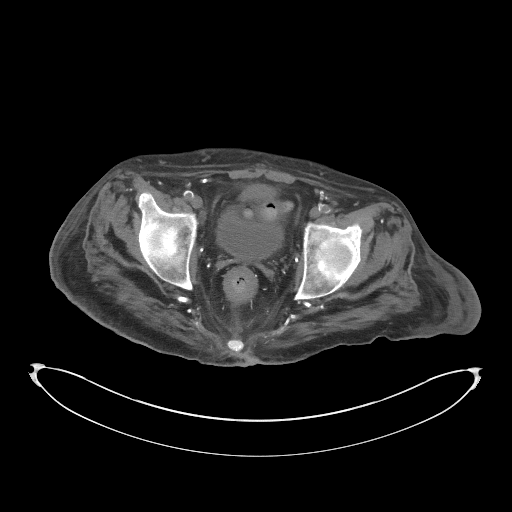
[im 29/103  soft-tissue]
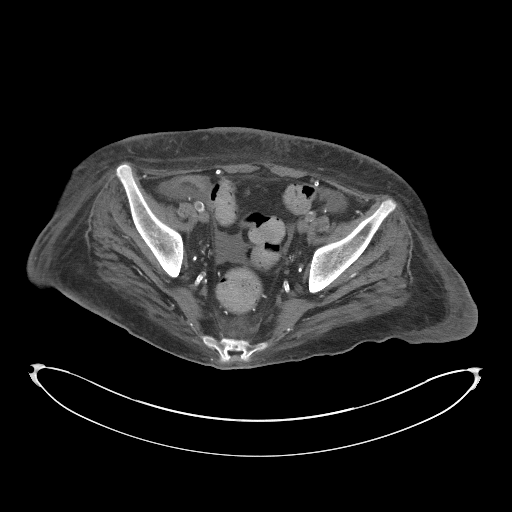
[im 35/103  soft-tissue]
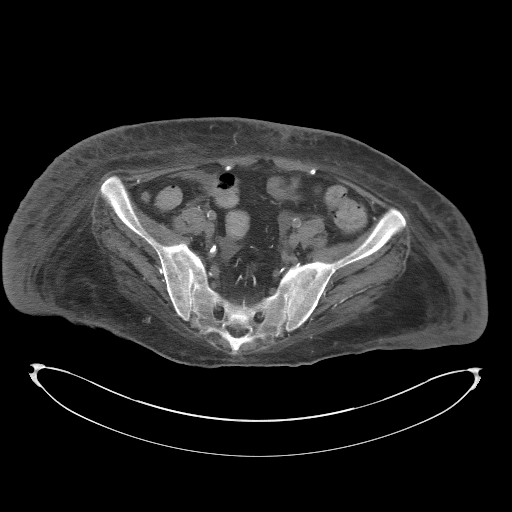
[im 46/103  soft-tissue]
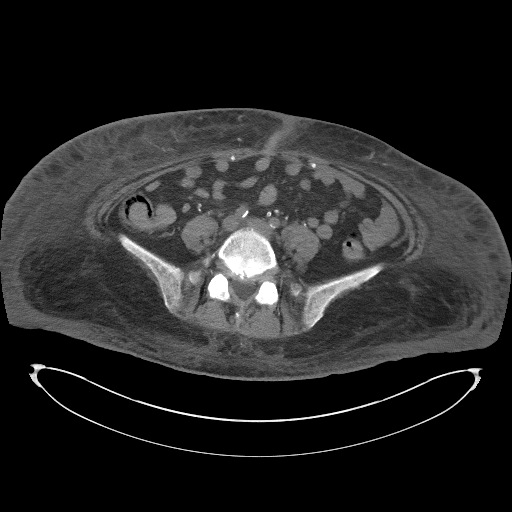
[im 52/103  soft-tissue]
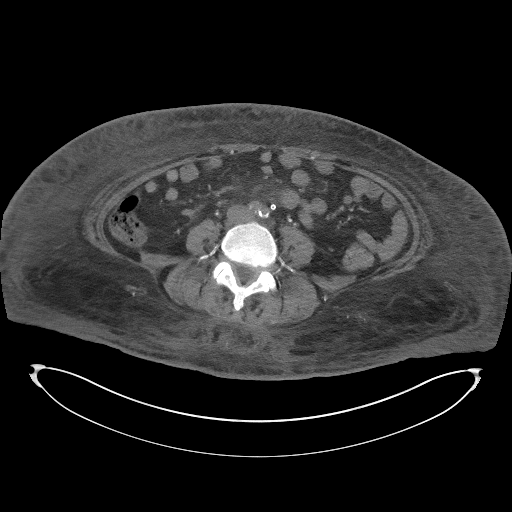
[im 57/103  soft-tissue]
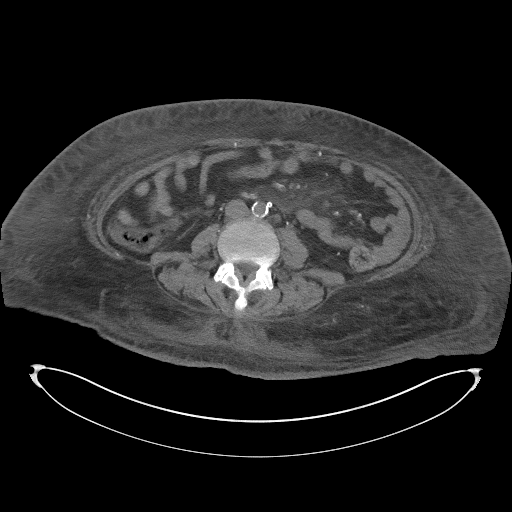
[im 69/103  soft-tissue]
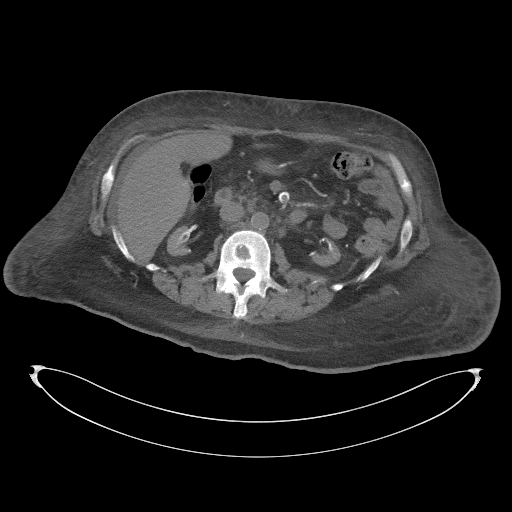
[im 69/103  bone]
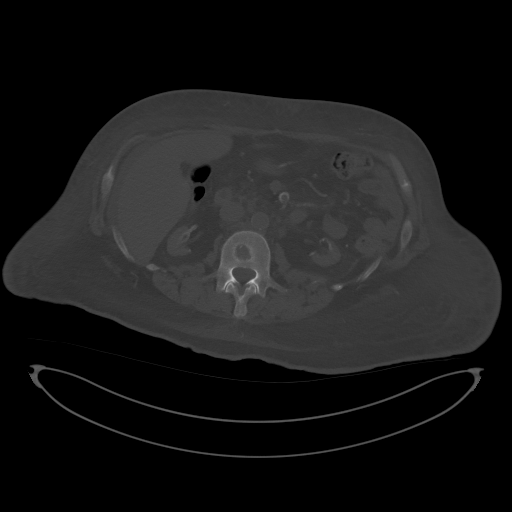
[im 74/103  soft-tissue]
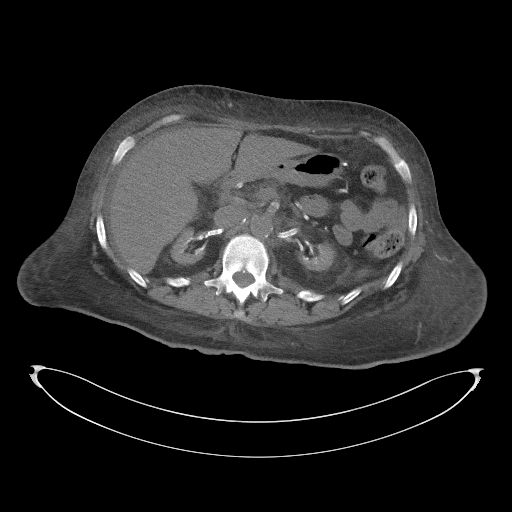
[im 80/103  soft-tissue]
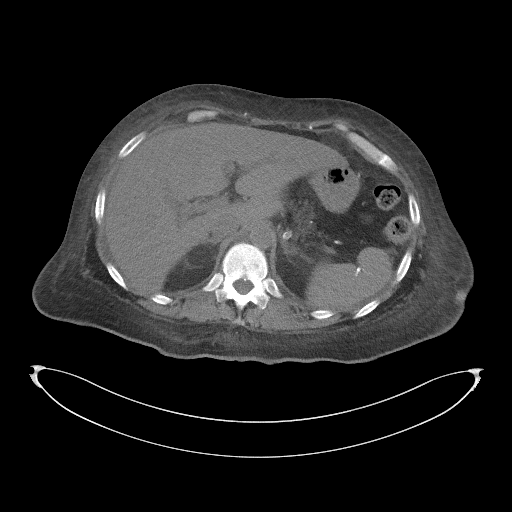
[im 91/103  soft-tissue]
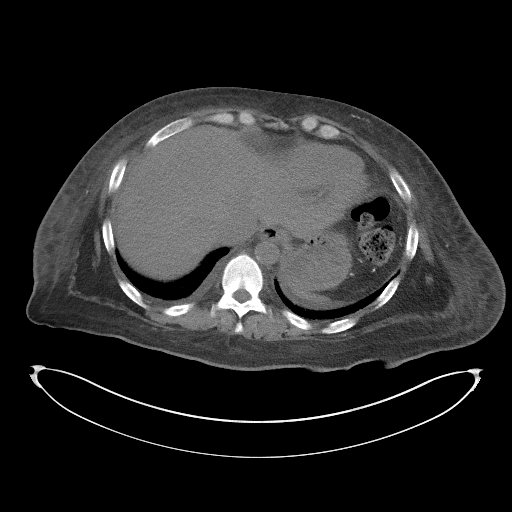
[im 97/103  soft-tissue]
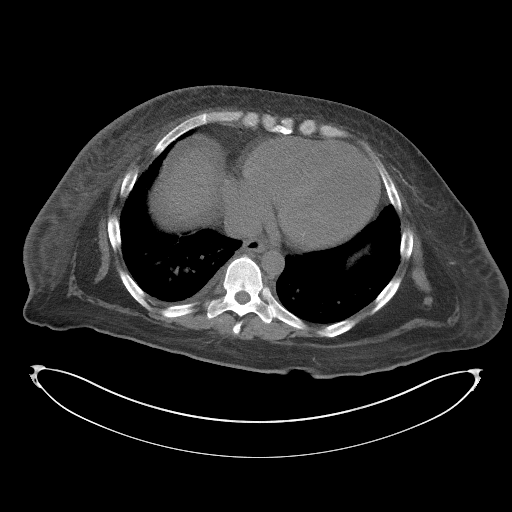

[Series 5: coronal st · coronal · 1.00mm/px · 3 of 143 slices shown]
[im 48/143  soft-tissue]
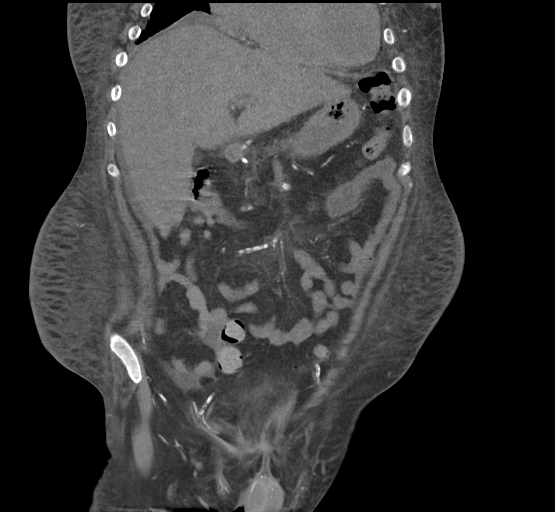
[im 64/143  soft-tissue]
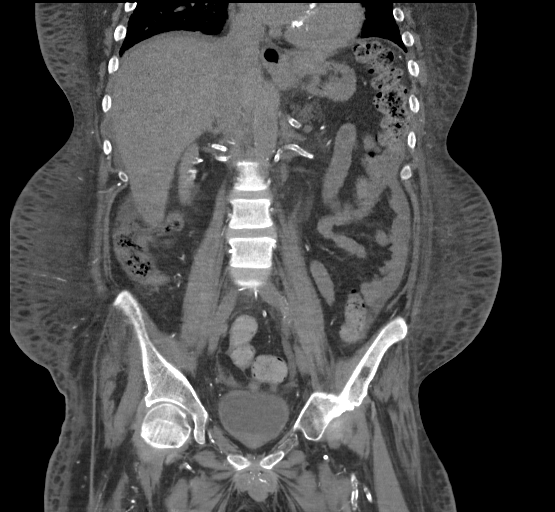
[im 79/143  soft-tissue]
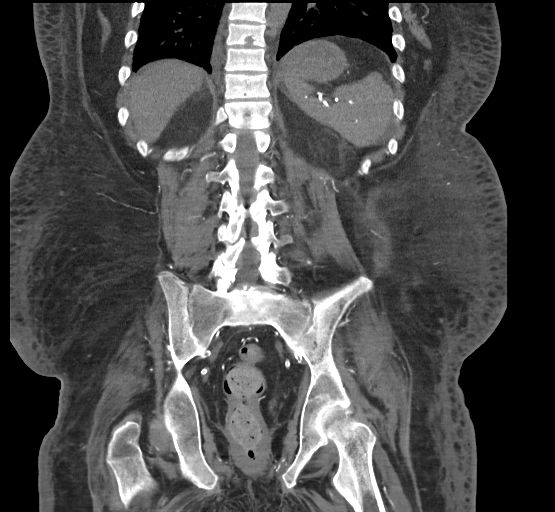

[16 of 46 positions shown; findings below may reference images not displayed]

FINDINGS: Lower chest: Heart is enlarged. Interlobular septal thickening
associated with diffuse hazy ground-glass opacity in both lung
bases. Tiny right pleural effusion.

Hepatobiliary: Liver measures 20.5 cm craniocaudal length. The liver
shows diffusely decreased attenuation suggesting fat deposition. No
focal abnormality in the liver on this study without intravenous
contrast. Nonvisualization of the gallbladder is compatible with
underdistention or surgical resection. No intrahepatic or
extrahepatic biliary dilation.

Pancreas: Diffuse fatty replacement.  No main duct dilatation.

Spleen: No splenomegaly. No focal mass lesion.

Adrenals/Urinary Tract: No adrenal nodule or mass. Both kidneys
atrophic. No hydronephrosis. No evidence for hydroureter. The
urinary bladder appears normal for the degree of distention.

Stomach/Bowel: Stomach is unremarkable. No gastric wall thickening.
No evidence of outlet obstruction. Duodenum is normally positioned
as is the ligament of Treitz. No small bowel wall thickening. No
small bowel dilatation. The terminal ileum is normal. The appendix
is normal. No gross colonic mass. No colonic wall thickening.

Vascular/Lymphatic: There is moderate atherosclerotic calcification
of the abdominal aorta without aneurysm. Haziness in the para-aortic
retroperitoneal fat and anterior pararenal space is similar to
prior. There is no gastrohepatic or hepatoduodenal ligament
lymphadenopathy. No retroperitoneal or mesenteric lymphadenopathy.
No pelvic sidewall lymphadenopathy.

Reproductive: The prostate gland and seminal vesicles are
unremarkable.

Other: Trace intraperitoneal free fluid noted around the liver and
spleen as well as in the pelvis. Haziness in the central small bowel
mesentery is nonspecific. Diffuse body wall

Musculoskeletal: No worrisome lytic or sclerotic osseous
abnormality. Edema noted.
IMPRESSION: 1. No acute findings in the abdomen or pelvis. Specifically, no
findings to explain the patient's history of nausea and vomiting.
2. Hepatomegaly with hepatic steatosis.
3. Trace intraperitoneal free fluid with diffuse body wall edema.
4. Bilateral renal atrophy.
5. Interlobular septal thickening associated with diffuse hazy
ground-glass opacity in both lung bases. Imaging features suggest
pulmonary edema.
6. Aortic Atherosclerosis ([L1]-[L1]).

## 2021-03-10 IMAGING — DX DG CHEST 1V PORT
1 series · 1 of 1 positions shown · non-contrast
Comparison: [DATE]

CLINICAL DATA: Emesis.

EXAM:
PORTABLE CHEST 1 VIEW

[chest ap]
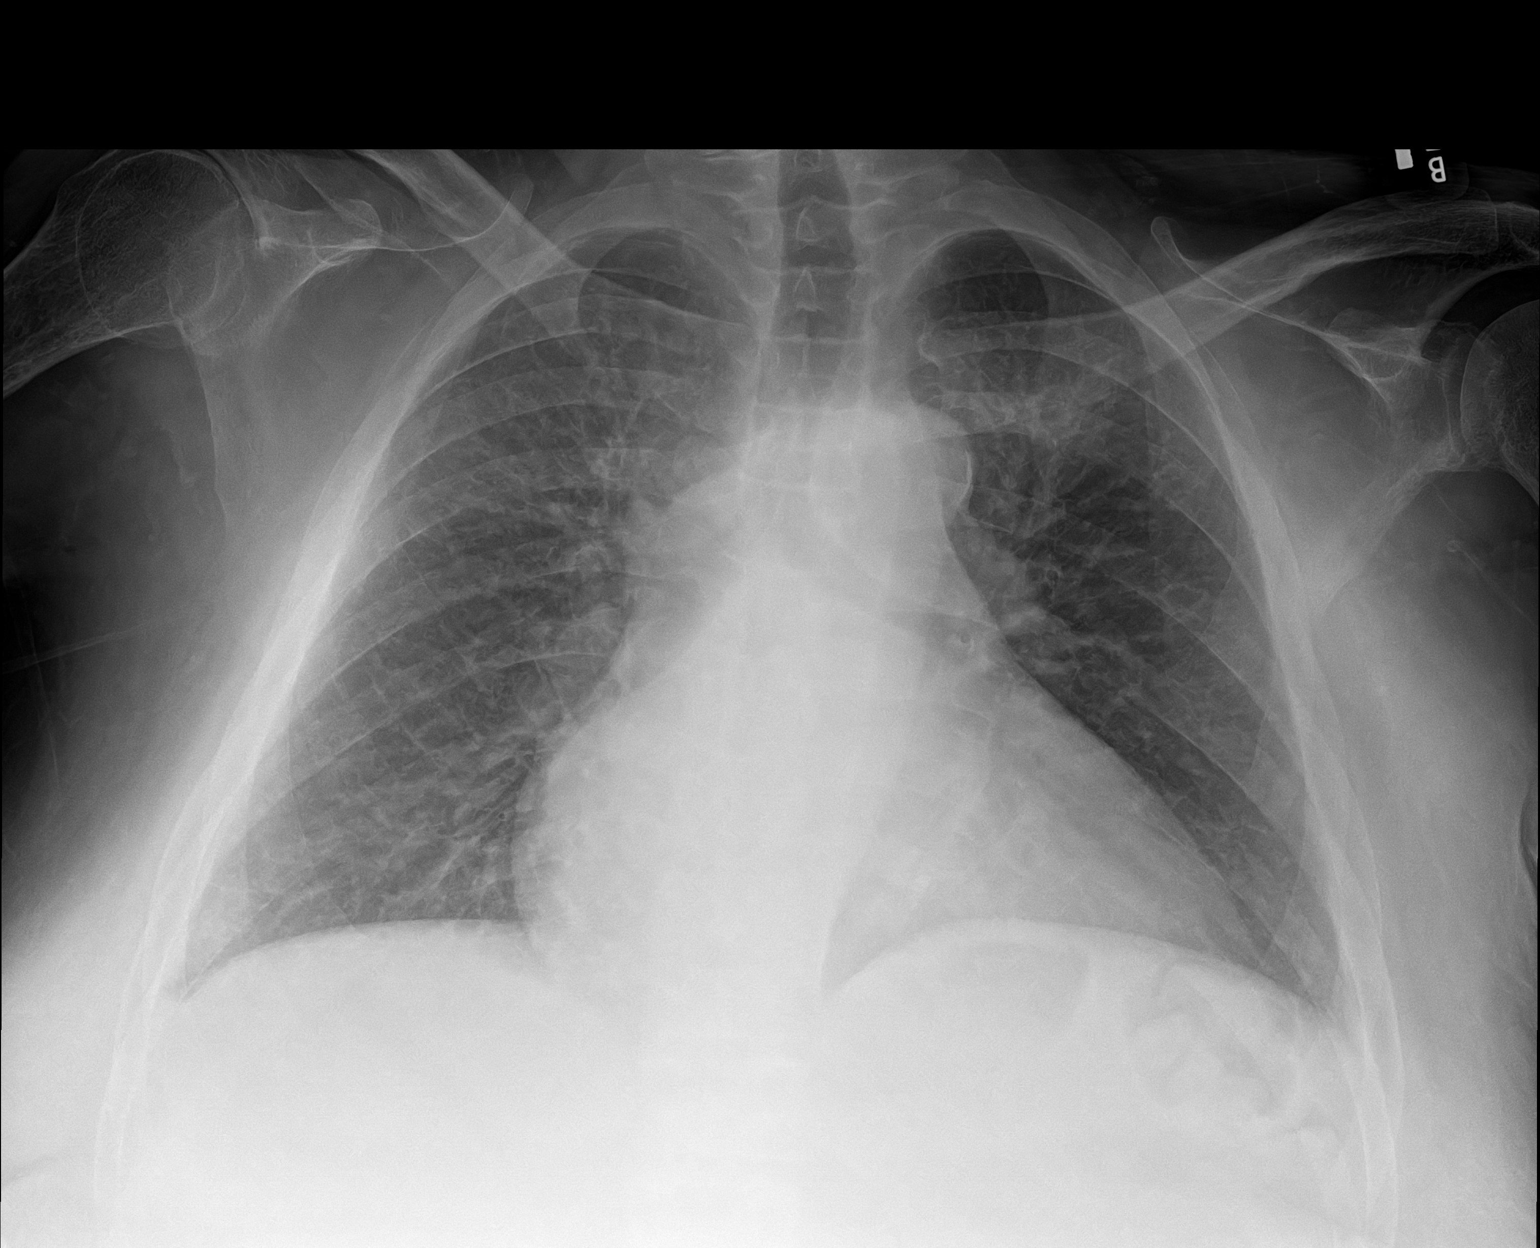

[1 of 1 positions shown; findings below may reference images not displayed]

FINDINGS: Heart size is mildly enlarged and stable. Atherosclerotic
calcifications at the aortic arch. Prominent lung markings are
unchanged. No focal airspace disease or pulmonary edema. No acute
bone abnormality.
IMPRESSION: 1. Stable cardiomegaly.
2. Prominent lung markings appear chronic.  No acute chest findings.

## 2021-03-10 MED ORDER — HYDROCODONE-ACETAMINOPHEN 5-325 MG PO TABS
2.0000 | ORAL_TABLET | Freq: Once | ORAL | Status: AC
  Administered 2021-03-10: 2 via ORAL
  Filled 2021-03-10: qty 2

## 2021-03-10 MED ORDER — APIXABAN 5 MG PO TABS
5.0000 mg | ORAL_TABLET | Freq: Two times a day (BID) | ORAL | Status: DC
  Administered 2021-03-10 – 2021-03-12 (×3): 5 mg via ORAL
  Filled 2021-03-10 (×4): qty 1

## 2021-03-10 MED ORDER — ALBUTEROL SULFATE HFA 108 (90 BASE) MCG/ACT IN AERS: 2.0000 | INHALATION_SPRAY | RESPIRATORY_TRACT | Status: DC | PRN

## 2021-03-10 MED ORDER — ACETAMINOPHEN 325 MG PO TABS: 650.0000 mg | ORAL_TABLET | Freq: Four times a day (QID) | ORAL | Status: DC | PRN

## 2021-03-10 MED ORDER — SODIUM CHLORIDE 0.9 % IV SOLN
12.5000 mg | Freq: Once | INTRAVENOUS | Status: AC
  Administered 2021-03-10: 12.5 mg via INTRAVENOUS
  Filled 2021-03-10: qty 0.5

## 2021-03-10 MED ORDER — ATORVASTATIN CALCIUM 10 MG PO TABS
10.0000 mg | ORAL_TABLET | ORAL | Status: DC
  Administered 2021-03-12: 10 mg via ORAL
  Filled 2021-03-10 (×2): qty 1

## 2021-03-10 MED ORDER — PANTOPRAZOLE SODIUM 40 MG PO TBEC
40.0000 mg | DELAYED_RELEASE_TABLET | Freq: Every day | ORAL | Status: DC
Start: 2021-03-10 — End: 2021-03-12
  Administered 2021-03-10: 40 mg via ORAL
  Filled 2021-03-10 (×3): qty 1

## 2021-03-10 MED ORDER — ONDANSETRON HCL 4 MG/2ML IJ SOLN
4.0000 mg | Freq: Four times a day (QID) | INTRAMUSCULAR | Status: DC | PRN
  Administered 2021-03-10 – 2021-03-12 (×3): 4 mg via INTRAVENOUS
  Filled 2021-03-10 (×3): qty 2

## 2021-03-10 MED ORDER — ONDANSETRON HCL 4 MG/2ML IJ SOLN
4.0000 mg | Freq: Once | INTRAMUSCULAR | Status: AC
Start: 2021-03-10 — End: 2021-03-10
  Administered 2021-03-10: 4 mg via INTRAVENOUS
  Filled 2021-03-10: qty 2

## 2021-03-10 MED ORDER — OXYCODONE HCL 5 MG PO TABS
2.5000 mg | ORAL_TABLET | ORAL | Status: DC | PRN
  Administered 2021-03-12 (×2): 2.5 mg via ORAL
  Filled 2021-03-10 (×2): qty 1

## 2021-03-10 MED ORDER — FENTANYL CITRATE PF 50 MCG/ML IJ SOSY
50.0000 ug | PREFILLED_SYRINGE | Freq: Once | INTRAMUSCULAR | Status: AC
  Administered 2021-03-10: 50 ug via INTRAVENOUS
  Filled 2021-03-10: qty 1

## 2021-03-10 MED ORDER — ASPIRIN 81 MG PO CHEW
81.0000 mg | CHEWABLE_TABLET | Freq: Every day | ORAL | Status: DC
Start: 2021-03-10 — End: 2021-03-12
  Administered 2021-03-10: 81 mg via ORAL
  Filled 2021-03-10 (×6): qty 1

## 2021-03-10 MED ORDER — CALCIUM ACETATE (PHOS BINDER) 667 MG PO CAPS
667.0000 mg | ORAL_CAPSULE | Freq: Three times a day (TID) | ORAL | Status: DC
  Administered 2021-03-12: 667 mg via ORAL
  Filled 2021-03-10 (×3): qty 1

## 2021-03-10 MED ORDER — PENTAFLUOROPROP-TETRAFLUOROETH EX AERO: 1.0000 "application " | INHALATION_SPRAY | CUTANEOUS | Status: DC | PRN

## 2021-03-10 MED ORDER — CALCIUM CARBONATE ANTACID 500 MG PO CHEW
800.0000 mg | CHEWABLE_TABLET | Freq: Two times a day (BID) | ORAL | Status: DC
  Administered 2021-03-10 – 2021-03-12 (×3): 800 mg via ORAL
  Filled 2021-03-10 (×4): qty 4

## 2021-03-10 MED ORDER — LACTATED RINGERS IV BOLUS
500.0000 mL | Freq: Once | INTRAVENOUS | Status: AC
  Administered 2021-03-10: 500 mL via INTRAVENOUS

## 2021-03-10 MED ORDER — METOCLOPRAMIDE HCL 5 MG/ML IJ SOLN
10.0000 mg | Freq: Once | INTRAMUSCULAR | Status: AC
Start: 2021-03-10 — End: 2021-03-10
  Administered 2021-03-10: 10 mg via INTRAVENOUS
  Filled 2021-03-10: qty 2

## 2021-03-10 MED ORDER — LIDOCAINE-PRILOCAINE 2.5-2.5 % EX CREA: 1.0000 "application " | TOPICAL_CREAM | CUTANEOUS | Status: DC | PRN

## 2021-03-10 MED ORDER — OXYCODONE HCL 5 MG PO TABS: 5.0000 mg | ORAL_TABLET | Freq: Four times a day (QID) | ORAL | Status: DC | PRN

## 2021-03-10 MED ORDER — ONDANSETRON HCL 4 MG PO TABS
4.0000 mg | ORAL_TABLET | Freq: Four times a day (QID) | ORAL | Status: DC | PRN
  Administered 2021-03-12: 4 mg via ORAL
  Filled 2021-03-10: qty 1

## 2021-03-10 MED ORDER — SODIUM CHLORIDE 0.9 % IV SOLN: 100.0000 mL | INTRAVENOUS | Status: DC | PRN

## 2021-03-10 MED ORDER — LIDOCAINE HCL (PF) 1 % IJ SOLN: 5.0000 mL | INTRAMUSCULAR | Status: DC | PRN

## 2021-03-10 MED ORDER — CHLORHEXIDINE GLUCONATE CLOTH 2 % EX PADS: 6.0000 | MEDICATED_PAD | Freq: Every day | CUTANEOUS | Status: DC

## 2021-03-10 MED ORDER — FENTANYL CITRATE PF 50 MCG/ML IJ SOSY
12.5000 ug | PREFILLED_SYRINGE | INTRAMUSCULAR | Status: DC | PRN
  Administered 2021-03-10 – 2021-03-12 (×11): 12.5 ug via INTRAVENOUS
  Filled 2021-03-10 (×11): qty 1

## 2021-03-10 MED ORDER — ACETAMINOPHEN 650 MG RE SUPP: 650.0000 mg | Freq: Four times a day (QID) | RECTAL | Status: DC | PRN

## 2021-03-10 MED ORDER — CARVEDILOL 3.125 MG PO TABS
3.1250 mg | ORAL_TABLET | Freq: Two times a day (BID) | ORAL | Status: DC
  Administered 2021-03-12: 3.125 mg via ORAL
  Filled 2021-03-10 (×3): qty 1

## 2021-03-10 MED ORDER — PNEUMOCOCCAL VAC POLYVALENT 25 MCG/0.5ML IJ INJ
0.5000 mL | INJECTION | INTRAMUSCULAR | Status: AC
  Administered 2021-03-12: 0.5 mL via INTRAMUSCULAR
  Filled 2021-03-10: qty 0.5

## 2021-03-10 MED ORDER — VANCOMYCIN HCL IN DEXTROSE 1-5 GM/200ML-% IV SOLN
1000.0000 mg | INTRAVENOUS | Status: DC
  Administered 2021-03-12: 1000 mg via INTRAVENOUS
  Filled 2021-03-10: qty 200

## 2021-03-10 MED ORDER — PROCHLORPERAZINE EDISYLATE 10 MG/2ML IJ SOLN: 5.0000 mg | Freq: Four times a day (QID) | INTRAMUSCULAR | Status: DC | PRN

## 2021-03-10 NOTE — ED Notes (Signed)
Doren Custard, MD at bedside speaking with pt

## 2021-03-10 NOTE — Procedures (Signed)
° °  HEMODIALYSIS TREATMENT NOTE:   Withdrawn but cooperative, providing one-word or non-verbal answers. HD initiated without problems. Promptly went to sleep after treatment was started.  He woke with a start 1.5 hours into session and asked to terminate the treatment.  All comfort measures were rejected, he would not negotiate and simply repeated "Angie, take me off."  Dr. Johnney Ou was notified via secure chat.  Total run time: 1 hour 40 minutes Net UF 1 liter.   Rockwell Alexandria, RN

## 2021-03-10 NOTE — ED Notes (Signed)
Phlebotomy aware of need for lab draw

## 2021-03-10 NOTE — Progress Notes (Signed)
Patient refused PO medication, patient stated that he was not going to take it because he was going to vomit. Patient was given Zofran prior to giving PO medication. When offered dinner tray, patient refused and covered face with blanket. MD Wynetta Emery aware.

## 2021-03-10 NOTE — ED Notes (Signed)
Pt noted to have PVCs and a run of Vtach x4, MD notified

## 2021-03-10 NOTE — ED Triage Notes (Signed)
Pt via EMS due to nausea and vomiting for the past couple days.

## 2021-03-10 NOTE — H&P (Signed)
History and Physical  [place] CBU:[id number] DOB: April 21, 1970 DOA: 03/10/2021  PCP: Monico Blitz, MD  Patient coming from: Home  Level of care: Med-Surg  I have personally briefly reviewed patient's old medical records in [place]  Chief Complaint: nausea and vomiting   HPI: Brendan Campbell is a 51 y.o. male with medical history significant of ESRD on HD MWF (poor compliance with treatments), type 2 DM with neuropathy, atrial fibrillation, hyperlipidemia, cardiomyopathy with EF 20%, right BKA, status post recent left BKA, severe peripheral vascular disease presented to the emergency department complaining of severe nausea and vomiting abdominal pain and diarrhea.  Patient reports that he last had a hemodialysis 2 days ago on Monday 03/08/21.  He did not go to dialysis today.  He reports that he has had loose bowel movements for the past several days.  He has been completing a course of antibiotics for MRSA infection.  He was supposed to be taking vancomycin treatments with hemodialysis through 03/15/2021.  He reports that he has had difficulty tolerating p.o. for the last 3 days. ED Course: He was treated in the emergency department with IV nausea medications.  He was sent for CT abdomen and pelvis but no acute findings.  Admission was requested for supportive management as he no longer can tolerate p.o. after treatment in the ED.  Review of Systems: Review of Systems  Constitutional:  Positive for malaise/fatigue. Negative for chills, diaphoresis, fever and weight loss.  HENT: Negative.    Eyes:  Positive for blurred vision and double vision. Negative for discharge and redness.  Respiratory: Negative.    Cardiovascular: Negative.   Gastrointestinal:  Positive for abdominal pain, diarrhea, heartburn, nausea and vomiting. Negative for blood in stool, constipation and melena.  Genitourinary: Negative.   Musculoskeletal: Negative.   Skin:  Positive for itching and  rash.  Neurological: Negative.   Endo/Heme/Allergies: Negative.   Psychiatric/Behavioral: Negative.    All other systems reviewed and are negative.   Past Medical History:  Diagnosis Date   Anemia    Anxiety    Blood transfusion without reported diagnosis    CHF (congestive heart failure) ([place])    a. EF 25% by echo in 07/2019   Chronic kidney disease    Depression    Diabetes mellitus without complication ([place])    End stage renal disease ([place])    M/W/F [facility]   Hyperlipidemia    Neuropathy    Peripheral vascular disease (HCC)    PTSD (post-traumatic stress disorder)     Past Surgical History:  Procedure Laterality Date   A/V FISTULAGRAM N/A 10/09/2018   Procedure: A/V FISTULAGRAM;  Surgeon: Serafina Mitchell, MD;  Location: [place] [facility];  Service: Cardiovascular;  Laterality: N/A;   AMPUTATION Left 02/16/2021   Procedure: AMPUTATION BELOW KNEE;  Surgeon: Broadus John, MD;  Location: [place];  Service: Vascular;  Laterality: Left;   AV FISTULA PLACEMENT Left 09/22/2016   Procedure: CREATION OF LEFT ARM ARTERIOVENOUS (AV) FISTULA;  Surgeon: Waynetta Sandy, MD;  Location: [place];  Service: Vascular;  Laterality: Left;   AV FISTULA PLACEMENT Left 10/31/2017   Procedure: INSERTION OF ARTERIOVENOUS (AV) GORE-TEX 4-34mm STETCH GRAFT LEFT ARM;  Surgeon: Waynetta Sandy, MD;  Location: [place];  Service: Vascular;  Laterality: Left;   [place] Left 08/17/2017   Procedure: SECOND STAGE BASILIC VEIN TRANSPOSITION LEFT ARM;  Surgeon: Waynetta Sandy, MD;  Location: [place];  Service: Vascular;  Laterality: Left;   BELOW KNEE LEG AMPUTATION Right    CARDIOVERSION N/A 02/19/2019   Procedure: CARDIOVERSION;  Surgeon: [name], MD;  Location: [facility];  Service: Cardiovascular;  Laterality: N/A;   CATARACT EXTRACTION W/PHACO Right 10/23/2020   Procedure: CATARACT EXTRACTION PHACO AND INTRAOCULAR LENS PLACEMENT ([place]);  Surgeon:  Baruch Goldmann, MD;  Location: [facility];  Service: Ophthalmology;  Laterality: Right;  CDE 39.03   CHOLECYSTECTOMY     FOOT SURGERY     IR FLUORO GUIDE CV LINE RIGHT  05/16/2016   IR FLUORO GUIDE CV LINE RIGHT  02/16/2019   IR REMOVAL TUN CV CATH W/O FL  05/16/2016   IR REMOVAL TUN CV CATH W/O FL  02/19/2019   IR THROMBECTOMY AV FISTULA W/THROMBOLYSIS/PTA INC/SHUNT/IMG LEFT Left 11/09/2018   IR THROMBECTOMY AV FISTULA W/THROMBOLYSIS/PTA INC/SHUNT/IMG LEFT Left 06/22/2019   IR US GUIDE VASC ACCESS LEFT  11/09/2018   IR US GUIDE VASC ACCESS LEFT  06/22/2019   IR US GUIDE VASC ACCESS RIGHT  05/16/2016   IR US GUIDE VASC ACCESS RIGHT  02/16/2019   PERIPHERAL VASCULAR BALLOON ANGIOPLASTY  10/09/2018   Procedure: PERIPHERAL VASCULAR BALLOON ANGIOPLASTY;  Surgeon: Serafina Mitchell, MD;  Location: [place] [facility];  Service: Cardiovascular;;   TEE WITHOUT CARDIOVERSION N/A 02/19/2019   Procedure: TRANSESOPHAGEAL ECHOCARDIOGRAM (TEE);  Surgeon: [name], MD;  Location: [facility];  Service: Cardiovascular;  Laterality: N/A;     reports that he quit smoking about 14 years ago. His smoking use included cigarettes. He has never used smokeless tobacco. He reports that he does not drink alcohol and does not use drugs.  Allergies  Allergen Reactions   Tape Other (See Comments)    Pulls skin off   Chlorhexidine Gluconate [Chlorhexidine] Rash    Family History  Problem Relation Age of Onset   Cancer Mother        lung   Diabetes Mother    Heart attack Father    Diabetes Father    Diabetes Sister     Prior to Admission medications   Medication Sig Start Date End Date Taking? Authorizing Provider  acetaminophen (TYLENOL) 325 MG tablet Take 2 tablets (650 mg total) by mouth every 6 (six) hours as needed for mild pain, moderate pain or fever. 02/06/21   Cherene Altes, MD  albuterol (VENTOLIN HFA) 108 (90 Base) MCG/ACT inhaler Inhale 2 puffs into the lungs every 4 (four) hours as needed for wheezing  or shortness of breath. 07/21/20   Manuella Ghazi, Pratik D, DO  apixaban (ELIQUIS) 5 MG TABS tablet Take 1 tablet (5 mg total) by mouth 2 (two) times daily. 08/23/19   Strader, Fransisco Hertz, PA-C  aspirin 81 MG chewable tablet Chew 81 mg by mouth daily.    [provider]  atorvastatin (LIPITOR) 10 MG tablet Take 1 tablet (10 mg total) by mouth every evening. Patient taking differently: Take 10 mg by mouth every evening. Take 1 tablet on Monday, Wednesday and Friday. 08/03/19   Barton Dubois, MD  butalbital-acetaminophen-caffeine (FIORICET) [id number] MG tablet Take 1 tablet by mouth every 6 (six) hours as needed for headache or migraine. 07/21/20   Manuella Ghazi, Pratik D, DO  calcium acetate (PHOSLO) 667 MG capsule Take 1 capsule (667 mg total) by mouth 3 (three) times daily with meals. 02/21/19   Donnamae Jude, MD  calcium carbonate (TUMS - DOSED IN MG ELEMENTAL CALCIUM) 500 MG chewable tablet Chew 4 tablets (800 mg of  elemental calcium total) by mouth 2 (two) times daily. 08/03/19   Barton Dubois, MD  carvedilol (COREG) 3.125 MG tablet Take 1 tablet (3.125 mg total) by mouth 2 (two) times daily with a meal. Patient not taking: Reported on 12/07/2020 04/09/20 11/26/20  Deatra Adir, MD  Cholecalciferol (VITAMIN D3) 25 MCG (1000 UT) CAPS Take 1 capsule by mouth See admin instructions. Take before Dialysis on Monday Wednesday and Fridays    [provider]  oxyCODONE (OXY IR/ROXICODONE) 5 MG immediate release tablet Take 1 tablet (5 mg total) by mouth every 6 (six) hours as needed for severe pain. 02/19/21   Rhyne, Hulen Shouts, PA-C  pantoprazole (PROTONIX) 20 MG tablet Take 1 tablet (20 mg total) by mouth daily. 11/26/20   Milton Ferguson, MD  patiromer (VELTASSA) 8.4 g packet Take 1 packet (8.4 g total) by mouth 3 (three) times a week. On Tuesday, Thursday and Saturday Patient not taking: Reported on 02/12/2021 06/10/19   Kathie Dike, MD  vancomycin [facility]) 1-5 GM/200ML-% SOLN Inject 200 mLs (1,000  mg total) into the vein every Monday, Wednesday, and Friday with hemodialysis for 24 days. 02/19/21 03/15/21  Antonieta Pert, MD    Physical Exam: Vitals:   03/10/21 1230 03/10/21 1300 03/10/21 1330 03/10/21 1400  BP: (!) 126/92 (!) 134/96 (!) 126/94 (!) 129/92  Pulse: 99 100 99   Resp: (!) 23 17 (!) 22 20  SpO2: 100% 100% 100%   Weight:       Constitutional: chronically ill appearing male, NAD, calm, comfortable Eyes: PERRL, lids and conjunctivae normal ENMT: Mucous membranes are dry.  Posterior pharynx clear of any exudate or lesions. Poor dentition.  Neck: normal, supple, no masses, no thyromegaly Respiratory: Bibasilar crackles. Normal respiratory effort. No accessory muscle use.  Cardiovascular: normal s1, s2 sounds, no murmurs / rubs / gallops. No extremity edema. 2+ pedal pulses. No carotid bruits.  Abdomen: no tenderness, no masses palpated. No hepatosplenomegaly. Bowel sounds positive.  Musculoskeletal: no clubbing / cyanosis. No joint deformity upper and lower extremities. Good ROM, no contractures. Normal muscle tone.  Skin: no rashes, lesions, ulcers. No induration Neurologic: CN 2-12 grossly intact. Sensation intact, DTR normal. Strength 5/5 in all 4.  Psychiatric: Poor judgment and insight. Somnolent but arousable. Mood unable to determine.   Labs on Admission: I have personally reviewed following labs and imaging studies  CBC: Recent Labs  Lab 03/10/21 0853  WBC 7.7  HGB 10.2*  HCT 32.3*  MCV 107.7*  PLT 557   Basic Metabolic Panel: Recent Labs  Lab 03/10/21 0853  NA 135  K 3.8  CL 97*  CO2 24  GLUCOSE 118*  BUN 29*  CREATININE 4.74*  CALCIUM 8.6*  MG 2.2   GFR: Estimated Creatinine Clearance: 21.9 mL/min (A) (by C-G formula based on SCr of 4.74 mg/dL (H)). Liver Function Tests: Recent Labs  Lab 03/10/21 0853  AST 15  ALT 12  ALKPHOS 87  BILITOT 1.6*  PROT 6.7  ALBUMIN 3.0*   Recent Labs  Lab 03/10/21 0853  LIPASE 24   No results for  input(s): AMMONIA in the last 168 hours. Coagulation Profile: No results for input(s): INR, PROTIME in the last 168 hours. Cardiac Enzymes: No results for input(s): CKTOTAL, CKMB, CKMBINDEX, TROPONINI in the last 168 hours. BNP (last 3 results) No results for input(s): PROBNP in the last 8760 hours. HbA1C: No results for input(s): HGBA1C in the last 72 hours. CBG: No results for input(s): GLUCAP in the last 168  hours. Lipid Profile: No results for input(s): CHOL, HDL, LDLCALC, TRIG, CHOLHDL, LDLDIRECT in the last 72 hours. Thyroid Function Tests: No results for input(s): TSH, T4TOTAL, FREET4, T3FREE, THYROIDAB in the last 72 hours. Anemia Panel: No results for input(s): VITAMINB12, FOLATE, FERRITIN, TIBC, IRON, RETICCTPCT in the last 72 hours. Urine analysis: No results found for: COLORURINE, APPEARANCEUR, [place], [place], GLUCOSEU, HGBUR, BILIRUBINUR, KETONESUR, PROTEINUR, UROBILINOGEN, NITRITE, LEUKOCYTESUR  Radiological Exams on Admission: CT ABDOMEN PELVIS WO CONTRAST  Result Date: 03/10/2021 CLINICAL DATA:  Nausea vomiting with abdominal pain. EXAM: CT ABDOMEN AND PELVIS WITHOUT CONTRAST TECHNIQUE: Multidetector CT imaging of the abdomen and pelvis was performed following the standard protocol without IV contrast. RADIATION DOSE REDUCTION: This exam was performed according to the departmental dose-optimization program which includes automated exposure control, adjustment of the mA and/or kV according to patient size and/or use of iterative reconstruction technique. COMPARISON:  None. FINDINGS: Lower chest: Heart is enlarged. Interlobular septal thickening associated with diffuse hazy ground-glass opacity in both lung bases. Tiny right pleural effusion. Hepatobiliary: Liver measures 20.5 cm craniocaudal length. The liver shows diffusely decreased attenuation suggesting fat deposition. No focal abnormality in the liver on this study without intravenous contrast. Nonvisualization of the  gallbladder is compatible with underdistention or surgical resection. No intrahepatic or extrahepatic biliary dilation. Pancreas: Diffuse fatty replacement.  No main duct dilatation. Spleen: No splenomegaly. No focal mass lesion. Adrenals/Urinary Tract: No adrenal nodule or mass. Both kidneys atrophic. No hydronephrosis. No evidence for hydroureter. The urinary bladder appears normal for the degree of distention. Stomach/Bowel: Stomach is unremarkable. No gastric wall thickening. No evidence of outlet obstruction. Duodenum is normally positioned as is the ligament of [place]itz. No small bowel wall thickening. No small bowel dilatation. The terminal ileum is normal. The appendix is normal. No gross colonic mass. No colonic wall thickening. Vascular/Lymphatic: There is moderate atherosclerotic calcification of the abdominal aorta without aneurysm. Haziness in the para-aortic retroperitoneal fat and anterior pararenal space is similar to prior. There is no gastrohepatic or hepatoduodenal ligament lymphadenopathy. No retroperitoneal or mesenteric lymphadenopathy. No pelvic sidewall lymphadenopathy. Reproductive: The prostate gland and seminal vesicles are unremarkable. Other: Trace intraperitoneal free fluid noted around the liver and spleen as well as in the pelvis. Haziness in the central small bowel mesentery is nonspecific. Diffuse body wall Musculoskeletal: No worrisome lytic or sclerotic osseous abnormality. Edema noted. IMPRESSION: 1. No acute findings in the abdomen or pelvis. Specifically, no findings to explain the patient's history of nausea and vomiting. 2. Hepatomegaly with hepatic steatosis. 3. Trace intraperitoneal free fluid with diffuse body wall edema. 4. Bilateral renal atrophy. 5. Interlobular septal thickening associated with diffuse hazy ground-glass opacity in both lung bases. Imaging features suggest pulmonary edema. 6. Aortic Atherosclerosis (ICD10-I70.0). Electronically Signed   By: Misty Stanley  M.D.   On: 03/10/2021 08:11   DG Chest Portable 1 View  Result Date: 03/10/2021 CLINICAL DATA:  Emesis. EXAM: PORTABLE CHEST 1 VIEW COMPARISON:  02/12/2021 FINDINGS: Heart size is mildly enlarged and stable. Atherosclerotic calcifications at the aortic arch. Prominent lung markings are unchanged. No focal airspace disease or pulmonary edema. No acute bone abnormality. IMPRESSION: 1. Stable cardiomegaly. 2. Prominent lung markings appear chronic.  No acute chest findings. Electronically Signed   By: Markus Daft M.D.   On: 03/10/2021 07:47    EKG: Independently reviewed. No acute changes   Assessment/Plan Principal Problem:   Intractable nausea and vomiting Active Problems:   Type 2 diabetes mellitus with diabetic chronic kidney disease ([place])  Hyperlipidemia   ESRD on hemodialysis (HCC)   Neuropathy   Peripheral vascular disease (HCC)   Chronic atrial fibrillation (HCC)   Hyperparathyroidism (HCC)   Noncompliance   Intractable nausea and vomiting  - multifactorial due to poor self care  - frequently misses hemodialysis treatments  - continue supportive measures - IV nausea medication ordered - consult to nephrology for HD treatment   ESRD on HD - frequently misses treatments  -He presents in a volume overloaded state at this time -Pulmonary edema on chest x-ray -Consult to nephrology for dialysis treatment  Type 2 diabetes mellitus with neuropathy -He has been diet controlled -No insulin coverage ordered at this time  Chronic atrial fibrillation -Resume carvedilol 3.125 mg twice daily and apixaban 5 mg twice daily  PVD severe -Recently status post left BKA -Staples appear to be in place -Poor wound care at home -Monitor for s/s of infection  -Pt is not wearing stump protector  -Pt is not caring for wound well as it appears dirty  - He was supposed to have staples removed in 4 weeks from surgery around 03/20/21  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  Given his noncompliance it is not certain that he has been taking the cephalexin and we know he has not been going to HD regularly so missing treatments of vancomycin as well.   Chronic combined systolic heart failure -Volume has been managed with hemodialysis -He is due for HD treatment today and unfortunately missed his outpatient treatment today -consulted to inpatient nephrology team for dialysis orders  Hyperlipidemia -Resume home atorvastatin  DVT prophylaxis: apixaban   Code Status: Full   Family Communication: none present   Disposition Plan: TBD   Consults called: nephrology   Admission status: OBV   Level of care: Med-Surg Irwin Brakeman MD [facility] How to contact the [facility] Attending or Consulting provider [place] or covering provider during after hours 7P -7A, for this patient?  Check the care team in [facility] and look for a) attending/consulting [facility] provider listed and b) the [facility] team listed Log into [email or web address] and use [place]'s universal password to access. If you do not have the password, please contact the hospital operator. Locate the [facility] provider you are looking for under [facility] and page to a number that you can be directly reached. If you still have difficulty reaching the provider, please page the [facility] (Director on Call) for the Hospitalists listed on amion for assistance.   If 7PM-7AM, please contact night-coverage [email or web address] Password [facility]  03/10/2021, 3:07 PM

## 2021-03-10 NOTE — ED Notes (Signed)
Unable to obtain labs with IV start

## 2021-03-10 NOTE — ED Notes (Signed)
Pt to CT

## 2021-03-10 NOTE — ED Notes (Signed)
Upon return to ED the pt had removed cardiac leads and O2 sat probe, monitoring equipment reconnected

## 2021-03-10 NOTE — ED Provider Notes (Signed)
[facility] EMERGENCY DEPARTMENT Provider Note   CSN: [id number] Arrival date & time: 03/10/21  1443     History  Chief Complaint  Patient presents with   Emesis    Brendan Campbell is a 51 y.o. male.   Emesis Associated symptoms: abdominal pain and diarrhea   Patient presents for several days of nausea and vomiting.  Medical history is notable for T2DM, HLD, ESRD on HD, atrial fibrillation, CHF, right BKA, neuropathy.  He reports p.o. intolerance for the last 3 days.  He has continued to have bowel movements and states that they have been loose.  He reports that he has had nausea and vomiting the past but never this bad.  He did receive a full treatment of HD on Monday.  He is scheduled for his next session today.  Patient has had generalized abdominal pain.  He does continue to make urine.    Home Medications Prior to Admission medications   Medication Sig Start Date End Date Taking? Authorizing Provider  acetaminophen (TYLENOL) 325 MG tablet Take 2 tablets (650 mg total) by mouth every 6 (six) hours as needed for mild pain, moderate pain or fever. 02/06/21   Cherene Altes, MD  albuterol (VENTOLIN HFA) 108 (90 Base) MCG/ACT inhaler Inhale 2 puffs into the lungs every 4 (four) hours as needed for wheezing or shortness of breath. 07/21/20   Manuella Ghazi, Pratik D, DO  apixaban (ELIQUIS) 5 MG TABS tablet Take 1 tablet (5 mg total) by mouth 2 (two) times daily. 08/23/19   Strader, Fransisco Hertz, PA-C  aspirin 81 MG chewable tablet Chew 81 mg by mouth daily.    [provider]  atorvastatin (LIPITOR) 10 MG tablet Take 1 tablet (10 mg total) by mouth every evening. Patient taking differently: Take 10 mg by mouth every evening. Take 1 tablet on Monday, Wednesday and Friday. 08/03/19   Barton Dubois, MD  butalbital-acetaminophen-caffeine (FIORICET) [phone] MG tablet Take 1 tablet by mouth every 6 (six) hours as needed for headache or migraine. 07/21/20   Manuella Ghazi, Pratik D, DO  calcium acetate  (PHOSLO) 667 MG capsule Take 1 capsule (667 mg total) by mouth 3 (three) times daily with meals. 02/21/19   Donnamae Jude, MD  calcium carbonate (TUMS - DOSED IN MG ELEMENTAL CALCIUM) 500 MG chewable tablet Chew 4 tablets (800 mg of elemental calcium total) by mouth 2 (two) times daily. 08/03/19   Barton Dubois, MD  carvedilol (COREG) 3.125 MG tablet Take 1 tablet (3.125 mg total) by mouth 2 (two) times daily with a meal. Patient not taking: Reported on 12/07/2020 04/09/20 11/26/20  Deatra Maruice, MD  Cholecalciferol (VITAMIN D3) 25 MCG (1000 UT) CAPS Take 1 capsule by mouth See admin instructions. Take before Dialysis on Monday Wednesday and Fridays    [provider]  oxyCODONE (OXY IR/ROXICODONE) 5 MG immediate release tablet Take 1 tablet (5 mg total) by mouth every 6 (six) hours as needed for severe pain. 02/19/21   Rhyne, Hulen Shouts, PA-C  pantoprazole (PROTONIX) 20 MG tablet Take 1 tablet (20 mg total) by mouth daily. 11/26/20   Milton Ferguson, MD  patiromer (VELTASSA) 8.4 g packet Take 1 packet (8.4 g total) by mouth 3 (three) times a week. On Tuesday, Thursday and Saturday Patient not taking: Reported on 02/12/2021 06/10/19   Kathie Dike, MD  vancomycin [facility]) 1-5 GM/200ML-% SOLN Inject 200 mLs (1,000 mg total) into the vein every Monday, Wednesday, and Friday with hemodialysis for 24 days. 02/19/21  03/15/21  Antonieta Pert, MD      Allergies    Tape and Chlorhexidine gluconate [chlorhexidine]    Review of Systems   Review of Systems  Gastrointestinal:  Positive for abdominal pain, diarrhea, nausea and vomiting.  All other systems reviewed and are negative.  Physical Exam Updated Vital Signs There were no vitals taken for this visit. Physical Exam Vitals and nursing note reviewed.  Constitutional:      General: He is not in acute distress.    Appearance: He is well-developed. He is ill-appearing (Chronically). He is not diaphoretic.  HENT:     Head: Normocephalic and  atraumatic.     Right Ear: External ear normal.     Left Ear: External ear normal.     Nose: Nose normal.     Mouth/Throat:     Mouth: Mucous membranes are moist.     Pharynx: Oropharynx is clear.  Eyes:     Extraocular Movements: Extraocular movements intact.     Conjunctiva/sclera: Conjunctivae normal.  Cardiovascular:     Rate and Rhythm: Normal rate and regular rhythm.     Heart sounds: No murmur heard. Pulmonary:     Effort: Pulmonary effort is normal. No respiratory distress.  Chest:     Chest wall: No tenderness.  Abdominal:     Palpations: Abdomen is soft.     Tenderness: There is no abdominal tenderness.  Musculoskeletal:        General: No swelling or tenderness.     Cervical back: Normal range of motion. No rigidity.     Comments: Bilateral BKA's.  Staples in place on the left.  No warmth, erythema, or purulence.  Skin:    General: Skin is warm and dry.     Capillary Refill: Capillary refill takes less than 2 seconds.     Coloration: Skin is pale. Skin is not jaundiced.  Neurological:     General: No focal deficit present.     Mental Status: He is alert and oriented to person, place, and time.     Cranial Nerves: No cranial nerve deficit.     Sensory: No sensory deficit.     Motor: No weakness.     Coordination: Coordination normal.  Psychiatric:        Mood and Affect: Mood is anxious. Affect is tearful.        Speech: Speech normal. Speech is not slurred.        Behavior: Behavior normal. Behavior is cooperative.    ED Results / Procedures / Treatments   Labs (all labs ordered are listed, but only abnormal results are displayed) Labs Reviewed - No data to display  EKG None  Radiology No results found.  Procedures Procedures    Medications Ordered in ED Medications - No data to display  ED Course/ Medical Decision Making/ A&P                           Medical Decision Making Amount and/or Complexity of Data Reviewed Labs:  ordered. Radiology: ordered.  Risk Prescription drug management. Decision regarding hospitalization.   This patient presents to the ED for concern of nausea, vomiting, and p.o. intolerance, this involves an extensive number of treatment options, and is a complaint that carries with it a high risk of complications and morbidity.  The differential diagnosis includes enteritis, gastritis, PUD, GERD, bowel obstruction, ACS, pneumonia.  MDM:    Chronically ill 51 year old male presenting for approximately  3 days of nausea, vomiting, and p.o. intolerance.  He does continue to make bowel movements and states that they have been loose in nature.  On arrival in the ED, he has continued nausea.  He endorses generalized abdominal pain.  Abdomen, however, soft without tenderness.  He has multiple skin lesions that appear to be old.  He has old right BKA and a new left BKA.  Left BKA is without evidence of infection to the surgical site.  Laboratory work-up to be initiated.  Patient was given Phenergan and Zofran for symptomatic relief.  Gentle IV fluids to be given given his ESRD state.  Fentanyl was ordered for analgesia.  Patient to undergo noncontrasted CT scan of the abdomen pelvis.  Patient's lab work showed results similar to his baseline lab work.  There were no acute findings on chest x-ray or CT scan of abdomen and pelvis.  On reassessment, patient had resolution of vomiting and improved nausea.  He was encouraged to take in p.o. fluids.  Patient is scheduled to undergo dialysis today at 1130.  On reassessment, patient had recurrence of nausea and vomiting.  He was not able to tolerate p.o. fluids.  Reglan was ordered.  I suspect that the etiology of his symptoms are gastroparesis.  I was hopeful that he would respond well to Reglan but he did not.  He was ultimately admitted to hospitalist for p.o. intolerance.  Labs: I Ordered, and personally interpreted labs.  The pertinent results include: Normal  lipase, normal transaminases, baseline elevated troponin, baseline macrocytic anemia, no leukocytosis  Imaging Studies ordered: I ordered imaging studies including chest x-ray, CT of abdomen and pelvis. I independently visualized and interpreted imaging which showed no acute findings. I agree with the radiologist interpretation  Additional history obtained from N/A.  External records from outside source obtained and reviewed including EMR  Cardiac Monitoring: The patient was maintained on a cardiac monitor.  I personally viewed and interpreted the cardiac monitored which showed an underlying rhythm of: Sinus rhythm  Reevaluation: After the interventions noted above, I reevaluated the patient and found that they have :worsened   Social Determinants of Health: Multiple chronic illnesses despite young age  Disposition: Admission  Co morbidities that complicate the patient evaluation  Past Medical History:  Diagnosis Date   Anemia    Anxiety    Blood transfusion without reported diagnosis    CHF (congestive heart failure) (HCC)    a. EF 25% by echo in 07/2019   Chronic kidney disease    Depression    Diabetes mellitus without complication ([place])    End stage renal disease ([place])    M/W/F [facility]   Hyperlipidemia    Neuropathy    Peripheral vascular disease ([place])    PTSD (post-traumatic stress disorder)      Medicines Meds ordered this encounter  Medications   lactated ringers bolus 500 mL   promethazine (PHENERGAN) 12.5 mg in sodium chloride 0.9 % 50 mL IVPB   ondansetron (ZOFRAN) injection 4 mg   fentaNYL (SUBLIMAZE) injection 50 mcg    I have reviewed the patients home medicines and have made adjustments as needed  Problem List / ED Course: Problem List Items Addressed This Visit   None Nausea vomiting P.o. intolerance             Final Clinical Impression(s) / ED Diagnoses Final diagnoses:  None    Rx / DC Orders ED Discharge Orders      None  Godfrey Pick, MD 03/11/21 [phone]

## 2021-03-11 ENCOUNTER — Encounter (HOSPITAL_COMMUNITY): Payer: Self-pay | Admitting: Family Medicine

## 2021-03-11 DIAGNOSIS — R54 Age-related physical debility: Secondary | ICD-10-CM | POA: Diagnosis present

## 2021-03-11 DIAGNOSIS — N186 End stage renal disease: Secondary | ICD-10-CM | POA: Diagnosis present

## 2021-03-11 DIAGNOSIS — F32A Depression, unspecified: Secondary | ICD-10-CM | POA: Diagnosis present

## 2021-03-11 DIAGNOSIS — F4310 Post-traumatic stress disorder, unspecified: Secondary | ICD-10-CM | POA: Diagnosis present

## 2021-03-11 DIAGNOSIS — Z89512 Acquired absence of left leg below knee: Secondary | ICD-10-CM | POA: Diagnosis not present

## 2021-03-11 DIAGNOSIS — Z7189 Other specified counseling: Secondary | ICD-10-CM | POA: Diagnosis not present

## 2021-03-11 DIAGNOSIS — Z5320 Procedure and treatment not carried out because of patient's decision for unspecified reasons: Secondary | ICD-10-CM | POA: Diagnosis present

## 2021-03-11 DIAGNOSIS — I482 Chronic atrial fibrillation, unspecified: Secondary | ICD-10-CM | POA: Diagnosis present

## 2021-03-11 DIAGNOSIS — F419 Anxiety disorder, unspecified: Secondary | ICD-10-CM | POA: Diagnosis present

## 2021-03-11 DIAGNOSIS — M898X9 Other specified disorders of bone, unspecified site: Secondary | ICD-10-CM | POA: Diagnosis present

## 2021-03-11 DIAGNOSIS — Z515 Encounter for palliative care: Secondary | ICD-10-CM | POA: Diagnosis not present

## 2021-03-11 DIAGNOSIS — E1143 Type 2 diabetes mellitus with diabetic autonomic (poly)neuropathy: Secondary | ICD-10-CM | POA: Diagnosis present

## 2021-03-11 DIAGNOSIS — E785 Hyperlipidemia, unspecified: Secondary | ICD-10-CM | POA: Diagnosis present

## 2021-03-11 DIAGNOSIS — Z7901 Long term (current) use of anticoagulants: Secondary | ICD-10-CM | POA: Diagnosis not present

## 2021-03-11 DIAGNOSIS — Z23 Encounter for immunization: Secondary | ICD-10-CM | POA: Diagnosis not present

## 2021-03-11 DIAGNOSIS — Z89511 Acquired absence of right leg below knee: Secondary | ICD-10-CM | POA: Diagnosis not present

## 2021-03-11 DIAGNOSIS — Z79899 Other long term (current) drug therapy: Secondary | ICD-10-CM | POA: Diagnosis not present

## 2021-03-11 DIAGNOSIS — E1122 Type 2 diabetes mellitus with diabetic chronic kidney disease: Secondary | ICD-10-CM | POA: Diagnosis present

## 2021-03-11 DIAGNOSIS — R112 Nausea with vomiting, unspecified: Secondary | ICD-10-CM

## 2021-03-11 DIAGNOSIS — K3184 Gastroparesis: Secondary | ICD-10-CM | POA: Diagnosis present

## 2021-03-11 DIAGNOSIS — Z992 Dependence on renal dialysis: Secondary | ICD-10-CM | POA: Diagnosis not present

## 2021-03-11 DIAGNOSIS — Z961 Presence of intraocular lens: Secondary | ICD-10-CM | POA: Diagnosis present

## 2021-03-11 DIAGNOSIS — I5022 Chronic systolic (congestive) heart failure: Secondary | ICD-10-CM | POA: Diagnosis present

## 2021-03-11 DIAGNOSIS — E782 Mixed hyperlipidemia: Secondary | ICD-10-CM | POA: Diagnosis not present

## 2021-03-11 DIAGNOSIS — D631 Anemia in chronic kidney disease: Secondary | ICD-10-CM | POA: Diagnosis present

## 2021-03-11 DIAGNOSIS — I132 Hypertensive heart and chronic kidney disease with heart failure and with stage 5 chronic kidney disease, or end stage renal disease: Secondary | ICD-10-CM | POA: Diagnosis present

## 2021-03-11 DIAGNOSIS — Z20822 Contact with and (suspected) exposure to covid-19: Secondary | ICD-10-CM | POA: Diagnosis present

## 2021-03-11 LAB — RENAL FUNCTION PANEL
Albumin: 2.9 g/dL — ABNORMAL LOW (ref 3.5–5.0)
Anion gap: 16 — ABNORMAL HIGH (ref 5–15)
BUN: 27 mg/dL — ABNORMAL HIGH (ref 6–20)
CO2: 26 mmol/L (ref 22–32)
Calcium: 9.2 mg/dL (ref 8.9–10.3)
Chloride: 96 mmol/L — ABNORMAL LOW (ref 98–111)
Creatinine, Ser: 4.07 mg/dL — ABNORMAL HIGH (ref 0.61–1.24)
GFR, Estimated: 17 mL/min — ABNORMAL LOW (ref >60)
Glucose, Bld: 85 mg/dL (ref 70–99)
Phosphorus: 5.4 mg/dL — ABNORMAL HIGH (ref 2.5–4.6)
Potassium: 4.7 mmol/L (ref 3.5–5.1)
Sodium: 138 mmol/L (ref 135–145)

## 2021-03-11 LAB — CBC
HCT: 31.8 % — ABNORMAL LOW (ref 39.0–52.0)
Hemoglobin: 9.8 g/dL — ABNORMAL LOW (ref 13.0–17.0)
MCH: 33 pg (ref 26.0–34.0)
MCHC: 30.8 g/dL (ref 30.0–36.0)
MCV: 107.1 fL — ABNORMAL HIGH (ref 80.0–100.0)
Platelets: 245 10*3/uL (ref 150–400)
RBC: 2.97 MIL/uL — ABNORMAL LOW (ref 4.22–5.81)
RDW: 19 % — ABNORMAL HIGH (ref 11.5–15.5)
WBC: 7 10*3/uL (ref 4.0–10.5)
nRBC: 1.7 % — ABNORMAL HIGH (ref 0.0–0.2)

## 2021-03-11 MED ORDER — PHENOL 1.4 % MT LIQD
1.0000 | OROMUCOSAL | Status: DC | PRN
  Administered 2021-03-11: 1 via OROMUCOSAL
  Filled 2021-03-11: qty 177

## 2021-03-11 NOTE — Assessment & Plan Note (Signed)
-   He has been poorly compliant with caring for his recent left BKA wound -He now refuses to go to SNF and we are making arrangements to try and get him a personal care assistant at home -He does have decisional capacity at this time and he is adamantly refusing SNF placement

## 2021-03-11 NOTE — Assessment & Plan Note (Signed)
-   has been diet controlled, follow

## 2021-03-11 NOTE — Hospital Course (Signed)
51 y.o. male with medical history significant of ESRD on HD MWF (poor compliance with treatments), type 2 DM with neuropathy, atrial fibrillation, hyperlipidemia, cardiomyopathy with EF 20%, right BKA, status post recent left BKA, severe peripheral vascular disease presented to the emergency department complaining of severe nausea and vomiting abdominal pain and diarrhea.  Patient reports that he last had a hemodialysis 2 days ago on Monday 03/08/21.  He did not go to dialysis today.  He reports that he has had loose bowel movements for the past several days.  He has been completing a course of antibiotics for MRSA infection.  He was supposed to be taking vancomycin treatments with hemodialysis through 03/15/2021.  He reports that he has had difficulty tolerating p.o. for the last 3 days.  He was treated in the emergency department with IV nausea medications.  He was sent for CT abdomen and pelvis but no acute findings.  Admission was requested for supportive management as he no longer can tolerate p.o. after treatment in the ED.

## 2021-03-11 NOTE — Progress Notes (Signed)
This nurse went in to do morning assessment and mediation pass. Pt stated, "My throat is hurting and I don't feel like taking my medicine right now." I advised pt I would notify MD to make him aware. Chloraseptic spray was ordered and administered. Pt still refuses all PO medications. MD aware.

## 2021-03-11 NOTE — Consult Note (Signed)
Consultation Note Date: 03/11/2021   Patient Name: Brendan Campbell  DOB: Oct 05, 1970  MRN: [id number]  Age / Sex: 51 y.o., male  PCP: Monico Blitz, MD Referring Physician: Murlean Iba, MD  Reason for Consultation: Establishing goals of care  HPI/Patient Profile: 51 y.o. male  with past medical history of ESRD on HD, poor compliance, DM 2 with neuropathy, A-fib, HLD, cardiomyopathy with a EF of 20%, severe peripheral vascular disease, right BKA, recent left BKA, PTSD, anxiety admitted on 03/10/2021 with intractable nausea and vomiting multifocal.  Seen by Dr. Candiss Norse in infectious disease 02/17/2021 for left lower extremity dry gangrenous wound, right lower extremity cellulitis, history of MRSA bacteremia secondary to left lower extremity cellulitis.  6 weeks of IV vancomycin through 03/15/2021.  Keflex 3 weeks through 03/08/2021.  ID signed off  Clinical Assessment and Goals of Care: I have reviewed medical records including EPIC notes, labs and imaging, received report from RN, assessed the patient.  Mr. Ritzel is lying quietly in bed in a dark room.  He wakes easily when I call his name.  He will not make or keep eye contact, appears withdrawn.  He appears acutely/chronically ill, frail.  He is alert and oriented, able to make his basic needs known.  There is no family present at this time.  We meet to discuss diagnosis prognosis, GOC, EOL wishes, disposition and options.   I introduced Palliative Medicine as specialized medical care for people living with serious illness. It focuses on providing relief from the symptoms and stress of a serious illness. The goal is to improve quality of life for both the patient and the family.  Somewhat limited consultation today due to Mr. Zimmerle demeanor, desire to participate.  We discussed a brief life review of the patient.  Mr. Massman tells me that he is unmarried and has no  children.  He shares that his parents are deceased.  He lives with a roommate in [place].  We then focused on their current illness.  At this point Mr. Forse is unable/unwilling to tell me about his reluctance to complete hemodialysis.  The natural disease trajectory and expectations at EOL were discussed.  Advanced directives, concepts specific to code status, artifical feeding and hydration, and rehospitalization were considered and discussed.   At this point Mr. Edison Nasuti states that he would want life support, all measures to keep him alive.  He tells me that he would accept life support for several months, including trach/PEG.  Palliative Care services outpatient were explained and offered.  We talk about outpatient palliative for further goals of care discussion, talking about the "what ifs and maybes".  At this point Mr. Hirsch is agreeable to outpatient palliative services with [facility].  Discussed the importance of continued conversation with family and the medical providers regarding overall plan of care and treatment options, ensuring decisions are within the context of the patients values and GOCs.  Questions and concerns were addressed.  The patient was encouraged to call with questions or concerns.  PMT will continue to support holistically.  Conference with attending, bedside nursing staff, transition of care team related to patient, needs, goals of care, disposition.   HCPOA OTHER -at this point Mr. Edison Nasuti states that he would make his own decisions, and declines to name anyone else as a standing.  He tells me that his parents are deceased.  He has no spouse and no children.  He does tell me that he has siblings, but they are estranged.  He declines to name his friend, Pleas Koch as his healthcare surrogate. We talk about healthcare power of attorney paperwork.  I shared that if Mr. Righi does not name someone, his siblings would be his legal surrogate decision  makers.   SUMMARY OF RECOMMENDATIONS   Full scope/Full code Continue HD Agreeable to rehab if qualified Agreeable to outpatient palliative services with [facility]   Code Status/Advance Care Planning: Full code -would accept long-term life support, trach/PEG  Symptom Management:  Per hospitalist, no additional needs at this time.  Palliative Prophylaxis:  Frequent Pain Assessment and Palliative Wound Care  Additional Recommendations (Limitations, Scope, Preferences): Full Scope Treatment  Psycho-social/Spiritual:  Desire for further Chaplaincy support:no Additional Recommendations: Caregiving  Support/Resources  Prognosis:  Unable to determine, based on outcomes.  Guarded at this point.  6 months or less would not be surprising based on decreasing functional status, history of bacteremia, bilateral lower extremity amputations  Discharge Planning:  To be determined, based on outcomes and qualifications.  Home with home health would not be surprising.  Agreeable to outpatient palliative with [facility].       Primary Diagnoses: Present on Admission:  Intractable nausea and vomiting  Type 2 diabetes mellitus with diabetic chronic kidney disease ([place])  Hyperlipidemia  Peripheral vascular disease ([place])  Chronic atrial fibrillation ([place])  Hyperparathyroidism ([place])   I have reviewed the medical record, interviewed the patient and family, and examined the patient. The following aspects are pertinent.  Past Medical History:  Diagnosis Date   Anemia    Anxiety    Blood transfusion without reported diagnosis    CHF (congestive heart failure) (HCC)    a. EF 25% by echo in 07/2019   Chronic kidney disease    Depression    Diabetes mellitus without complication (HCC)    End stage renal disease ([place])    M/W/F [facility]   Hyperlipidemia    Neuropathy    Peripheral vascular disease (HCC)    PTSD (post-traumatic stress disorder)    Social History   Socioeconomic  History   Marital status: Single    Spouse name: Not on file   Number of children: Not on file   Years of education: Not on file   Highest education level: Not on file  Occupational History   Not on file  Tobacco Use   Smoking status: Former    Types: Cigarettes    Quit date: 09/03/2006    Years since quitting: 14.5   Smokeless tobacco: Never  Vaping Use   Vaping Use: Never used  Substance and Sexual Activity   Alcohol use: No   Drug use: No   Sexual activity: Not Currently  Other Topics Concern   Not on file  Social History Narrative   Pt lives in [place] with roommate.  Not followed by an outpatient provider currently, but is trying to schedule an appointment with [facility] Pablo Ledger.   Social Determinants of Health   Financial Resource Strain: Not on file  Food Insecurity: Not on  file  Transportation Needs: Not on file  Physical Activity: Not on file  Stress: Not on file  Social Connections: Not on file   Family History  Problem Relation Age of Onset   Cancer Mother        lung   Diabetes Mother    Heart attack Father    Diabetes Father    Diabetes Sister    Scheduled Meds:  apixaban  5 mg Oral BID   aspirin  81 mg Oral Daily   atorvastatin  10 mg Oral Q M,W,F   calcium acetate  667 mg Oral TID WC   calcium carbonate  800 mg of elemental calcium Oral BID   carvedilol  3.125 mg Oral BID WC   pantoprazole  40 mg Oral Daily   pneumococcal 23 valent vaccine  0.5 mL Intramuscular Tomorrow-1000   Continuous Infusions:  sodium chloride     sodium chloride     [START ON 03/12/2021] vancomycin     PRN Meds:.sodium chloride, sodium chloride, acetaminophen **OR** acetaminophen, albuterol, fentaNYL (SUBLIMAZE) injection, lidocaine (PF), lidocaine-prilocaine, ondansetron **OR** ondansetron (ZOFRAN) IV, oxyCODONE, pentafluoroprop-tetrafluoroeth, phenol, prochlorperazine Medications Prior to Admission:  Prior to Admission medications   Medication Sig Start Date  End Date Taking? Authorizing Provider  acetaminophen (TYLENOL) 325 MG tablet Take 2 tablets (650 mg total) by mouth every 6 (six) hours as needed for mild pain, moderate pain or fever. 02/06/21   Cherene Altes, MD  albuterol (VENTOLIN HFA) 108 (90 Base) MCG/ACT inhaler Inhale 2 puffs into the lungs every 4 (four) hours as needed for wheezing or shortness of breath. 07/21/20   Manuella Ghazi, Pratik D, DO  apixaban (ELIQUIS) 5 MG TABS tablet Take 1 tablet (5 mg total) by mouth 2 (two) times daily. 08/23/19   Strader, Fransisco Hertz, PA-C  aspirin 81 MG chewable tablet Chew 81 mg by mouth daily.    [provider]  atorvastatin (LIPITOR) 10 MG tablet Take 1 tablet (10 mg total) by mouth every evening. Patient taking differently: Take 10 mg by mouth every evening. Take 1 tablet on Monday, Wednesday and Friday. 08/03/19   Barton Dubois, MD  butalbital-acetaminophen-caffeine (FIORICET) [phone] MG tablet Take 1 tablet by mouth every 6 (six) hours as needed for headache or migraine. 07/21/20   Manuella Ghazi, Pratik D, DO  calcium acetate (PHOSLO) 667 MG capsule Take 1 capsule (667 mg total) by mouth 3 (three) times daily with meals. 02/21/19   Donnamae Jude, MD  calcium carbonate (TUMS - DOSED IN MG ELEMENTAL CALCIUM) 500 MG chewable tablet Chew 4 tablets (800 mg of elemental calcium total) by mouth 2 (two) times daily. 08/03/19   Barton Dubois, MD  carvedilol (COREG) 3.125 MG tablet Take 1 tablet (3.125 mg total) by mouth 2 (two) times daily with a meal. Patient not taking: Reported on 12/07/2020 04/09/20 11/26/20  Deatra Smith, MD  Cholecalciferol (VITAMIN D3) 25 MCG (1000 UT) CAPS Take 1 capsule by mouth See admin instructions. Take before Dialysis on Monday Wednesday and Fridays    [provider]  oxyCODONE (OXY IR/ROXICODONE) 5 MG immediate release tablet Take 1 tablet (5 mg total) by mouth every 6 (six) hours as needed for severe pain. 02/19/21   Rhyne, Hulen Shouts, PA-C  pantoprazole (PROTONIX) 20 MG  tablet Take 1 tablet (20 mg total) by mouth daily. 11/26/20   Milton Ferguson, MD  patiromer (VELTASSA) 8.4 g packet Take 1 packet (8.4 g total) by mouth 3 (three) times a week. On Tuesday, Thursday  and Saturday Patient not taking: Reported on 02/12/2021 06/10/19   Kathie Dike, MD  vancomycin [facility]) 1-5 GM/200ML-% SOLN Inject 200 mLs (1,000 mg total) into the vein every Monday, Wednesday, and Friday with hemodialysis for 24 days. 02/19/21 03/15/21  Antonieta Pert, MD   Allergies  Allergen Reactions   Tape Other (See Comments)    Pulls skin off   Chlorhexidine Gluconate [Chlorhexidine] Rash   Review of Systems  Unable to perform ROS: Other   Physical Exam Vitals and nursing note reviewed.  Constitutional:      General: He is not in acute distress.    Appearance: He is ill-appearing.  Cardiovascular:     Rate and Rhythm: Normal rate.  Pulmonary:     Effort: Pulmonary effort is normal. No respiratory distress.  Musculoskeletal:     Comments: BL LE amputations  Skin:    General: Skin is warm and dry.  Neurological:     Mental Status: He is alert and oriented to person, place, and time.  Psychiatric:        Mood and Affect: Mood normal.        Behavior: Behavior normal.    Vital Signs: BP 120/86 (BP Location: Right Arm)    Pulse 92    Temp 98.2 F (36.8 C)    Resp 17    Ht 5\' 9"  (1.753 m)    Wt 92.3 kg    SpO2 100%    BMI 30.05 kg/m  Pain Scale: 0-10   Pain Score: Asleep   SpO2: SpO2: 100 % O2 Device:SpO2: 100 % O2 Flow Rate: .O2 Flow Rate (L/min): 2 L/min  IO: Intake/output summary:  Intake/Output Summary (Last 24 hours) at 03/11/2021 1238 Last data filed at 03/11/2021 0900 Gross per 24 hour  Intake 250 ml  Output 1047 ml  Net -797 ml    LBM: Last BM Date: 03/08/21 Baseline Weight: Weight: 99.8 kg Most recent weight: Weight: 92.3 kg     Palliative Assessment/Data:   Flowsheet Rows    Flowsheet Row Most Recent Value  Intake Tab   Referral Department Hospitalist   Unit at Time of Referral Cardiac/Telemetry Unit  Palliative Care Primary Diagnosis Nephrology  Date Notified 03/10/21  Palliative Care Type New Palliative care  Reason for referral Clarify Goals of Care  Date of Admission 03/10/21  Date first seen by Palliative Care 03/11/21  # of days Palliative referral response time 1 Day(s)  # of days IP prior to Palliative referral 0  Clinical Assessment   Palliative Performance Scale Score 40%  Pain Max last 24 hours Not able to report  Pain Min Last 24 hours Not able to report  Dyspnea Max Last 24 Hours Not able to report  Dyspnea Min Last 24 hours Not able to report  Psychosocial & Spiritual Assessment   Palliative Care Outcomes        Time In: 0920 Time Out: 1015 Time Total: 55 minutes  Greater than 50%  of this time was spent counseling and coordinating care related to the above assessment and plan.  Signed by: Drue Novel, NP   Please contact Palliative Medicine Team phone at [phone] for questions and concerns.  For individual provider: See Shea Evans

## 2021-03-11 NOTE — Assessment & Plan Note (Signed)
stable at this time

## 2021-03-11 NOTE — Consult Note (Signed)
[facility] Renal Consultation Note    Indication for Consultation:  Management of ESRD/hemodialysis; anemia, hypertension/volume and secondary hyperparathyroidism  HPI: Brendan Campbell is a 51 y.o. male with a PMH significant for DM type 2, HTN, chronic systolic CHF, HLD, PAD s/p bilateral BKA's, anxiety, depression, medical noncompliance, and ESRD on HD MWF at [facility] who presented to [facility] ED c/o persistent nausea and vomiting associated with abdominal pain and diarrhea.  CT scan without acute findings and he was treated with antiemetics.  He was admitted due to persistent N/V and inability to take po.  We were consulted to provide HD during his hospitalization.  Past Medical History:  Diagnosis Date   Anemia    Anxiety    Blood transfusion without reported diagnosis    CHF (congestive heart failure) ([place])    a. EF 25% by echo in 07/2019   Chronic kidney disease    Depression    Diabetes mellitus without complication ([place])    End stage renal disease ([place])    M/W/F [facility]   Hyperlipidemia    Neuropathy    Peripheral vascular disease (HCC)    PTSD (post-traumatic stress disorder)    Past Surgical History:  Procedure Laterality Date   A/V FISTULAGRAM N/A 10/09/2018   Procedure: A/V FISTULAGRAM;  Surgeon: Serafina Mitchell, MD;  Location: [place] CV LAB;  Service: Cardiovascular;  Laterality: N/A;   AMPUTATION Left 02/16/2021   Procedure: AMPUTATION BELOW KNEE;  Surgeon: Broadus John, MD;  Location: [place];  Service: Vascular;  Laterality: Left;   AV FISTULA PLACEMENT Left 09/22/2016   Procedure: CREATION OF LEFT ARM ARTERIOVENOUS (AV) FISTULA;  Surgeon: Waynetta Sandy, MD;  Location: [place];  Service: Vascular;  Laterality: Left;   AV FISTULA PLACEMENT Left 10/31/2017   Procedure: INSERTION OF ARTERIOVENOUS (AV) GORE-TEX 4-71mm STETCH GRAFT LEFT ARM;  Surgeon: Waynetta Sandy, MD;  Location: [place];  Service: Vascular;  Laterality: Left;    [place] Left 08/17/2017   Procedure: SECOND STAGE BASILIC VEIN TRANSPOSITION LEFT ARM;  Surgeon: Waynetta Sandy, MD;  Location: [place];  Service: Vascular;  Laterality: Left;   BELOW KNEE LEG AMPUTATION Right    CARDIOVERSION N/A 02/19/2019   Procedure: CARDIOVERSION;  Surgeon: [name], MD;  Location: [place];  Service: Cardiovascular;  Laterality: N/A;   CATARACT EXTRACTION W/PHACO Right 10/23/2020   Procedure: CATARACT EXTRACTION PHACO AND INTRAOCULAR LENS PLACEMENT ([place]);  Surgeon: Baruch Goldmann, MD;  Location: [facility];  Service: Ophthalmology;  Laterality: Right;  CDE 39.03   CHOLECYSTECTOMY     FOOT SURGERY     IR FLUORO GUIDE CV LINE RIGHT  05/16/2016   IR FLUORO GUIDE CV LINE RIGHT  02/16/2019   IR REMOVAL TUN CV CATH W/O FL  05/16/2016   IR REMOVAL TUN CV CATH W/O FL  02/19/2019   IR THROMBECTOMY AV FISTULA W/THROMBOLYSIS/PTA INC/SHUNT/IMG LEFT Left 11/09/2018   IR THROMBECTOMY AV FISTULA W/THROMBOLYSIS/PTA INC/SHUNT/IMG LEFT Left 06/22/2019   IR US GUIDE VASC ACCESS LEFT  11/09/2018   IR US GUIDE VASC ACCESS LEFT  06/22/2019   IR US GUIDE VASC ACCESS RIGHT  05/16/2016   IR US GUIDE VASC ACCESS RIGHT  02/16/2019   PERIPHERAL VASCULAR BALLOON ANGIOPLASTY  10/09/2018   Procedure: PERIPHERAL VASCULAR BALLOON ANGIOPLASTY;  Surgeon: Serafina Mitchell, MD;  Location: [place] [facility];  Service: Cardiovascular;;   TEE WITHOUT CARDIOVERSION N/A 02/19/2019   Procedure: TRANSESOPHAGEAL ECHOCARDIOGRAM (TEE);  Surgeon: [name], MD;  Location: [facility] ENDOSCOPY;  Service: Cardiovascular;  Laterality: N/A;   Family History:   Family History  Problem Relation Age of Onset   Cancer Mother        lung   Diabetes Mother    Heart attack Father    Diabetes Father    Diabetes Sister    Social History:  reports that he quit smoking about 14 years ago. His smoking use included cigarettes. He has never used smokeless tobacco. He reports that he does not drink alcohol  and does not use drugs. Allergies  Allergen Reactions   Tape Other (See Comments)    Pulls skin off   Chlorhexidine Gluconate [Chlorhexidine] Rash   Prior to Admission medications   Medication Sig Start Date End Date Taking? Authorizing Provider  acetaminophen (TYLENOL) 325 MG tablet Take 2 tablets (650 mg total) by mouth every 6 (six) hours as needed for mild pain, moderate pain or fever. 02/06/21   Cherene Altes, MD  albuterol (VENTOLIN HFA) 108 (90 Base) MCG/ACT inhaler Inhale 2 puffs into the lungs every 4 (four) hours as needed for wheezing or shortness of breath. 07/21/20   Manuella Ghazi, Pratik D, DO  apixaban (ELIQUIS) 5 MG TABS tablet Take 1 tablet (5 mg total) by mouth 2 (two) times daily. 08/23/19   Strader, Fransisco Hertz, PA-C  aspirin 81 MG chewable tablet Chew 81 mg by mouth daily.    [provider]  atorvastatin (LIPITOR) 10 MG tablet Take 1 tablet (10 mg total) by mouth every evening. Patient taking differently: Take 10 mg by mouth every evening. Take 1 tablet on Monday, Wednesday and Friday. 08/03/19   Barton Dubois, MD  butalbital-acetaminophen-caffeine (FIORICET) [phone] MG tablet Take 1 tablet by mouth every 6 (six) hours as needed for headache or migraine. 07/21/20   Manuella Ghazi, Pratik D, DO  calcium acetate (PHOSLO) 667 MG capsule Take 1 capsule (667 mg total) by mouth 3 (three) times daily with meals. 02/21/19   Donnamae Jude, MD  calcium carbonate (TUMS - DOSED IN MG ELEMENTAL CALCIUM) 500 MG chewable tablet Chew 4 tablets (800 mg of elemental calcium total) by mouth 2 (two) times daily. 08/03/19   Barton Dubois, MD  carvedilol (COREG) 3.125 MG tablet Take 1 tablet (3.125 mg total) by mouth 2 (two) times daily with a meal. Patient not taking: Reported on 12/07/2020 04/09/20 11/26/20  Deatra Jhase, MD  Cholecalciferol (VITAMIN D3) 25 MCG (1000 UT) CAPS Take 1 capsule by mouth See admin instructions. Take before Dialysis on Monday Wednesday and Fridays    [provider]  oxyCODONE (OXY IR/ROXICODONE) 5 MG immediate release tablet Take 1 tablet (5 mg total) by mouth every 6 (six) hours as needed for severe pain. 02/19/21   Rhyne, Hulen Shouts, PA-C  pantoprazole (PROTONIX) 20 MG tablet Take 1 tablet (20 mg total) by mouth daily. 11/26/20   Milton Ferguson, MD  patiromer (VELTASSA) 8.4 g packet Take 1 packet (8.4 g total) by mouth 3 (three) times a week. On Tuesday, Thursday and Saturday Patient not taking: Reported on 02/12/2021 06/10/19   Kathie Dike, MD  vancomycin [facility]) 1-5 GM/200ML-% SOLN Inject 200 mLs (1,000 mg total) into the vein every Monday, Wednesday, and Friday with hemodialysis for 24 days. 02/19/21 03/15/21  Antonieta Pert, MD   Current Facility-Administered Medications  Medication Dose Route Frequency Provider Last Rate Last Admin   0.9 %  sodium chloride infusion  100 mL Intravenous PRN Donato Heinz,  MD       0.9 %  sodium chloride infusion  100 mL Intravenous PRN Donato Heinz, MD       acetaminophen (TYLENOL) tablet 650 mg  650 mg Oral Q6H PRN Johnson, Clanford L, MD       Or   acetaminophen (TYLENOL) suppository 650 mg  650 mg Rectal Q6H PRN Johnson, Clanford L, MD       albuterol (VENTOLIN HFA) 108 (90 Base) MCG/ACT inhaler 2 puff  2 puff Inhalation Q4H PRN Wynetta Emery, Clanford L, MD       apixaban (ELIQUIS) tablet 5 mg  5 mg Oral BID Wynetta Emery, Clanford L, MD   5 mg at 03/10/21 2056   aspirin chewable tablet 81 mg  81 mg Oral Daily Johnson, Clanford L, MD   81 mg at 03/10/21 1537   atorvastatin (LIPITOR) tablet 10 mg  10 mg Oral Q M,W,F Johnson, Clanford L, MD       calcium acetate (PHOSLO) capsule 667 mg  667 mg Oral TID WC Johnson, Clanford L, MD       calcium carbonate (TUMS - dosed in mg elemental calcium) chewable tablet 800 mg of elemental calcium  800 mg of elemental calcium Oral BID Johnson, Clanford L, MD   800 mg of elemental calcium at 03/10/21 2057   carvedilol (COREG) tablet 3.125 mg  3.125 mg Oral BID WC Johnson,  Clanford L, MD       fentaNYL (SUBLIMAZE) injection 12.5 mcg  12.5 mcg Intravenous Q3H PRN Johnson, Clanford L, MD   12.5 mcg at 03/11/21 0754   lidocaine (PF) (XYLOCAINE) 1 % injection 5 mL  5 mL Intradermal PRN Donato Heinz, MD       lidocaine-prilocaine (EMLA) cream 1 application  1 application Topical PRN Donato Heinz, MD       ondansetron [facility]) tablet 4 mg  4 mg Oral Q6H PRN Johnson, Clanford L, MD       Or   ondansetron (ZOFRAN) injection 4 mg  4 mg Intravenous Q6H PRN Wynetta Emery, Clanford L, MD   4 mg at 03/10/21 1720   oxyCODONE (Oxy IR/ROXICODONE) immediate release tablet 2.5 mg  2.5 mg Oral Q4H PRN Johnson, Clanford L, MD       pantoprazole (PROTONIX) EC tablet 40 mg  40 mg Oral Daily Johnson, Clanford L, MD   40 mg at 03/10/21 1537   pentafluoroprop-tetrafluoroeth (GEBAUERS) aerosol 1 application  1 application Topical PRN Donato Heinz, MD       phenol (CHLORASEPTIC) mouth spray 1 spray  1 spray Mouth/Throat PRN Wynetta Emery, Clanford L, MD   1 spray at 03/11/21 0933   pneumococcal 23 valent vaccine (PNEUMOVAX-23) injection 0.5 mL  0.5 mL Intramuscular Tomorrow-1000 Johnson, Clanford L, MD       prochlorperazine (COMPAZINE) injection 5 mg  5 mg Intravenous Q6H PRN Wynetta Emery, Clanford L, MD       [START ON 03/12/2021] vancomycin (VANCOCIN) IVPB 1000 mg/200 mL premix  1,000 mg Intravenous Q M,W,F-HD Murlean Iba, MD       Labs: Basic Metabolic Panel: Recent Labs  Lab 03/10/21 0853 03/11/21 0622  NA 135 138  K 3.8 4.7  CL 97* 96*  CO2 24 26  GLUCOSE 118* 85  BUN 29* 27*  CREATININE 4.74* 4.07*  CALCIUM 8.6* 9.2  PHOS  --  5.4*   Liver Function Tests: Recent Labs  Lab 03/10/21 0853 03/11/21 0622  AST 15  --   ALT 12  --   ALKPHOS  87  --   BILITOT 1.6*  --   PROT 6.7  --   ALBUMIN 3.0* 2.9*   Recent Labs  Lab 03/10/21 0853  LIPASE 24   No results for input(s): AMMONIA in the last 168 hours. CBC: Recent Labs  Lab 03/10/21 0853 03/11/21 0622   WBC 7.7 7.0  HGB 10.2* 9.8*  HCT 32.3* 31.8*  MCV 107.7* 107.1*  PLT 225 245   Cardiac Enzymes: No results for input(s): CKTOTAL, CKMB, CKMBINDEX, TROPONINI in the last 168 hours. CBG: No results for input(s): GLUCAP in the last 168 hours. Iron Studies: No results for input(s): IRON, TIBC, TRANSFERRIN, FERRITIN in the last 72 hours. Studies/Results: CT ABDOMEN PELVIS WO CONTRAST  Result Date: 03/10/2021 CLINICAL DATA:  Nausea vomiting with abdominal pain. EXAM: CT ABDOMEN AND PELVIS WITHOUT CONTRAST TECHNIQUE: Multidetector CT imaging of the abdomen and pelvis was performed following the standard protocol without IV contrast. RADIATION DOSE REDUCTION: This exam was performed according to the departmental dose-optimization program which includes automated exposure control, adjustment of the mA and/or kV according to patient size and/or use of iterative reconstruction technique. COMPARISON:  None. FINDINGS: Lower chest: Heart is enlarged. Interlobular septal thickening associated with diffuse hazy ground-glass opacity in both lung bases. Tiny right pleural effusion. Hepatobiliary: Liver measures 20.5 cm craniocaudal length. The liver shows diffusely decreased attenuation suggesting fat deposition. No focal abnormality in the liver on this study without intravenous contrast. Nonvisualization of the gallbladder is compatible with underdistention or surgical resection. No intrahepatic or extrahepatic biliary dilation. Pancreas: Diffuse fatty replacement.  No main duct dilatation. Spleen: No splenomegaly. No focal mass lesion. Adrenals/Urinary Tract: No adrenal nodule or mass. Both kidneys atrophic. No hydronephrosis. No evidence for hydroureter. The urinary bladder appears normal for the degree of distention. Stomach/Bowel: Stomach is unremarkable. No gastric wall thickening. No evidence of outlet obstruction. Duodenum is normally positioned as is the ligament of Treitz. No small bowel wall thickening. No  small bowel dilatation. The terminal ileum is normal. The appendix is normal. No gross colonic mass. No colonic wall thickening. Vascular/Lymphatic: There is moderate atherosclerotic calcification of the abdominal aorta without aneurysm. Haziness in the para-aortic retroperitoneal fat and anterior pararenal space is similar to prior. There is no gastrohepatic or hepatoduodenal ligament lymphadenopathy. No retroperitoneal or mesenteric lymphadenopathy. No pelvic sidewall lymphadenopathy. Reproductive: The prostate gland and seminal vesicles are unremarkable. Other: Trace intraperitoneal free fluid noted around the liver and spleen as well as in the pelvis. Haziness in the central small bowel mesentery is nonspecific. Diffuse body wall Musculoskeletal: No worrisome lytic or sclerotic osseous abnormality. Edema noted. IMPRESSION: 1. No acute findings in the abdomen or pelvis. Specifically, no findings to explain the patient's history of nausea and vomiting. 2. Hepatomegaly with hepatic steatosis. 3. Trace intraperitoneal free fluid with diffuse body wall edema. 4. Bilateral renal atrophy. 5. Interlobular septal thickening associated with diffuse hazy ground-glass opacity in both lung bases. Imaging features suggest pulmonary edema. 6. Aortic Atherosclerosis (ICD10-I70.0). Electronically Signed   By: Misty Stanley M.D.   On: 03/10/2021 08:11   DG Chest Portable 1 View  Result Date: 03/10/2021 CLINICAL DATA:  Emesis. EXAM: PORTABLE CHEST 1 VIEW COMPARISON:  02/12/2021 FINDINGS: Heart size is mildly enlarged and stable. Atherosclerotic calcifications at the aortic arch. Prominent lung markings are unchanged. No focal airspace disease or pulmonary edema. No acute bone abnormality. IMPRESSION: 1. Stable cardiomegaly. 2. Prominent lung markings appear chronic.  No acute chest findings. Electronically Signed   By: Quita Skye  Anselm Pancoast M.D.   On: 03/10/2021 07:47    ROS: Pertinent items are noted in HPI. Physical Exam: Vitals:    03/10/21 2015 03/10/21 2100 03/11/21 0153 03/11/21 0554  BP: 129/74 (!) 130/93 124/90 120/86  Pulse: 94 (!) 103 99 92  Resp: 18 17 18 17   Temp: 98 F (36.7 C) 98 F (36.7 C) 98 F (36.7 C) 98.2 F (36.8 C)  TempSrc: Oral     SpO2:  100% 96% 100%  Weight:    92.3 kg  Height:          Weight change:   Intake/Output Summary (Last 24 hours) at 03/11/2021 1015 Last data filed at 03/11/2021 0900 Gross per 24 hour  Intake 250 ml  Output 1047 ml  Net -797 ml   BP 120/86 (BP Location: Right Arm)    Pulse 92    Temp 98.2 F (36.8 C)    Resp 17    Ht 5\' 9"  (1.753 m)    Wt 92.3 kg    SpO2 100%    BMI 30.05 kg/m  General appearance: fatigued, no distress, and chronically ill-appearing Resp: clear to auscultation bilaterally Cardio: regular rate and rhythm, S1, S2 normal, no murmur, click, rub or gallop GI: soft, non-tender; bowel sounds normal; no masses,  no organomegaly Extremities: s/p bilateral BKA's, LUE AVF +T/B Dialysis Access:    Dialysis Orders:  Center: [facility]  on MWF  4h 55min  95 kg   1K/2.25 bath  Hep 1000+ 500u/hr  LUE AVG  300/500   - prosthetic weighs 1.6 kg  - Epogen 1000   Units IV/HD    Assessment/Plan:  Intractable nause and vomiting - improved overnight with antiemetics.  ESRD -  tolerated HD well yesterday and will continue with MWF schedule.  Hypertension/volume  - stable  Anemia  - continue with ESA and follow H/H  Metabolic bone disease -  resume home meds  Nutrition -  renal diet  Disposition - pt unable to care for himself since his L BKA and will need SNF placement.  Donetta Potts, MD [place] Pager [phone] 03/11/2021, 10:15 AM

## 2021-03-11 NOTE — Progress Notes (Addendum)
Pt refused dinner tray. Offered PO medicines again and pt still refused. Pt has refused all care attempted by NT today.

## 2021-03-11 NOTE — Assessment & Plan Note (Signed)
-   resume home meds if he will take them.

## 2021-03-11 NOTE — Assessment & Plan Note (Signed)
-   Appreciate nephrology consultation and recommendations -Patient received hemodialysis 03/10/21 and now is back on schedule

## 2021-03-11 NOTE — TOC Initial Note (Signed)
Transition of Care [facility]) - Initial/Assessment Note    Patient Details  Name: Brendan Campbell MRN: [id number] Date of Birth: 12-30-1970  [facility] CM/SW Contact:    Shade Flood, LCSW Phone Number: 03/11/2021, 2:13 PM  Clinical Narrative:                  Pt admitted from home. Received TOC consult for SNF placement. Met with pt to discuss dc planning. Explained to pt that if he goes to SNF, he will have to stay for at least 30 days and give them his monthly check. Pt states he has done that before and he is adamant that he will not do that again. Pt states he will return home at dc and continue with his in home services.  It appears pt was referred to [facility] during his last admission but he is no longer active with them at this time.  Will refer pt to [employer] for assessment of eligibility for personal care services through his Medicaid.  TOC will follow.  Expected Discharge Plan: [place] Barriers to Discharge: Continued Medical Work up   Patient Goals and CMS Choice Patient states their goals for this hospitalization and ongoing recovery are:: go home      Expected Discharge Plan and Services Expected Discharge Plan: [place] In-house Referral: Clinical Social Work                                            Prior Living Arrangements/Services     Patient language and need for interpreter reviewed:: Yes        Need for Family Participation in Patient Care: Yes (Comment) Care giver support system in place?: Yes (comment) Current home services: DME, Home PT Criminal Activity/Legal Involvement Pertinent to Current Situation/Hospitalization: No - Comment as needed  Activities of Daily Living Home Assistive Devices/Equipment: Oxygen, CBG Meter, Cane (specify quad or straight), Walker (specify type), Other (Comment) (prosthetic legs) ADL Screening (condition at time of  admission) Patient's cognitive ability adequate to safely complete daily activities?: Yes Is the patient deaf or have difficulty hearing?: No Does the patient have difficulty seeing, even when wearing glasses/contacts?: No Does the patient have difficulty concentrating, remembering, or making decisions?: No Patient able to express need for assistance with ADLs?: Yes Does the patient have difficulty dressing or bathing?: Yes Independently performs ADLs?: Yes (appropriate for developmental age) Does the patient have difficulty walking or climbing stairs?: No Weakness of Legs: None Weakness of Arms/Hands: None  Permission Sought/Granted                  Emotional Assessment Appearance:: Appears stated age Attitude/Demeanor/Rapport: Engaged Affect (typically observed): Accepting Orientation: : Oriented to Self, Oriented to Place, Oriented to  Time, Oriented to Situation Alcohol / Substance Use: Not Applicable Psych Involvement: No (comment)  Admission diagnosis:  Intractable nausea and vomiting [R11.2] Patient Active Problem List   Diagnosis Date Noted   Intractable nausea and vomiting 03/10/2021   Noncompliance 03/10/2021   MRSA bacteremia 02/13/2021   Rash 02/13/2021   Lower extremity cellulitis 02/04/2021   Right endophthalmia 62/04/5595   Chronic systolic CHF (congestive heart failure) ([place]) 07/27/2020   Volume overload 07/20/2020   COVID-19 virus infection 07/20/2020   Acute on chronic respiratory failure with hypoxia ([place]) 07/03/2020  Acute on chronic combined systolic and diastolic CHF (congestive heart failure) ([place]) 04/08/2020   Left hemiparesis ([place]) 04/08/2020   Lumbar spondylosis 04/08/2020   Atrial fibrillation with RVR ([place]) 04/08/2020   Pain in limb 12/29/2019   Cervical radiculopathy 12/29/2019   Acute congestive heart failure ([place]) 12/13/2019   Paresthesia 09/24/2019   Hypocalcemia    Chronic atrial fibrillation ([place])    Hyperparathyroidism ([place])     Cardiac arrest ([place]) 06/07/2019   Neuropathy    Peripheral vascular disease ([place])    ESRD on hemodialysis ([place])    Hyperkalemia 10/05/2018   Spondylosis of lumbar spine 03/08/2016   Lumbar radiculopathy 03/08/2016   S/P below knee amputation, right ([place]) 03/05/2015   Type 2 diabetes mellitus with diabetic chronic kidney disease ([place]) 03/03/2015   Hyperlipidemia 03/03/2015   Low back pain 03/03/2015   PCP:  Monico Blitz, MD Pharmacy:   [id number] [place] [id number]: [phone]: [phone]    Social Determinants of Health (SDOH) Interventions    Readmission Risk Interventions Readmission Risk Prevention Plan 07/21/2020  Transportation Screening Complete  PCP or Specialist Appt within 3-5 Days Complete  HRI or Home Care Consult Complete  Social Work Consult for [place]Counseling Complete  Palliative Care Screening Not Applicable  Medication Review (Press photographer Complete  Some recent data might be hidden

## 2021-03-11 NOTE — Progress Notes (Signed)
PROGRESS NOTE   Brendan Campbell  OQH:[id number] DOB: [birth year] DOA: 03/10/2021 PCP: Monico Blitz, MD   Chief Complaint  Patient presents with   Emesis   Level of care: Telemetry  Brief Admission History:   51 y.o. male with medical history significant of ESRD on HD MWF (poor compliance with treatments), type 2 DM with neuropathy, atrial fibrillation, hyperlipidemia, cardiomyopathy with EF 20%, right BKA, status post recent left BKA, severe peripheral vascular disease presented to the emergency department complaining of severe nausea and vomiting abdominal pain and diarrhea.  Patient reports that he last had a hemodialysis 2 days ago on Monday 03/08/21.  He did not go to dialysis today.  He reports that he has had loose bowel movements for the past several days.  He has been completing a course of antibiotics for MRSA infection.  He was supposed to be taking vancomycin treatments with hemodialysis through 03/15/2021.  He reports that he has had difficulty tolerating p.o. for the last 3 days.  He was treated in the emergency department with IV nausea medications.  He was sent for CT abdomen and pelvis but no acute findings.  Admission was requested for supportive management as he no longer can tolerate p.o. after treatment in the ED.   Assessment & Plan:   Assessment and Plan: * Intractable nausea and vomiting- (present on admission) -Likely from uremia due to poor compliance with HD treatments -Fortunately symptoms improved after supportive treatments given overnight -Feeding trial today -He did receive hemodialysis last night and is back on schedule   ESRD on hemodialysis [facility]) - Appreciate nephrology consultation and recommendations -Patient received hemodialysis 03/10/21 and now is back on schedule  Noncompliance - Prognosis remains poor -He refuses to complete dialysis treatments and refuses care and medical recommendations -Palliative medicine consultation requested -Outpatient  palliative services have been recommended  S/P bilateral BKA (below knee amputation) ([place]) - He has been poorly compliant with caring for his recent left BKA wound -He now refuses to go to SNF and we are making arrangements to try and get him a personal care assistant at home -He does have decisional capacity at this time and he is adamantly refusing SNF placement  Type 2 diabetes mellitus with diabetic chronic kidney disease ([place])- (present on admission) - has been diet controlled, follow  Hyperlipidemia- (present on admission) - resume home meds if he will take them.   Chronic atrial fibrillation (HCC)- (present on admission) - He continues to refuse care, refusing to take apixaban and carvedilol -He has decisional capacity and was counseled on the risks and he verbalized understanding  Hyperparathyroidism ([place])- (present on admission) -stable at this time   DVT prophylaxis: apixaban  Code Status: full  Family Communication: none present  Disposition: refuses SNF, plan DC home with [facility] and PCS 03/12/21 Status is: Inpatient Remains inpatient appropriate because: working on placement    Consultants:  Nephrology   Procedures:  Hemodialysis 03/10/21  Antimicrobials:  N/a   Subjective: Pt less nauseated and wants to try eating, complains of sore throat   Objective: Vitals:   03/10/21 2100 03/11/21 0153 03/11/21 0554 03/11/21 1330  BP: (!) 130/93 124/90 120/86 119/80  Pulse: (!) 103 99 92 97  Resp: 17 18 17 18   Temp: 98 F (36.7 C) 98 F (36.7 C) 98.2 F (36.8 C) (!) 97.4 F (36.3 C)  TempSrc:    Oral  SpO2: 100% 96% 100% 94%  Weight:   92.3 kg   Height:  Intake/Output Summary (Last 24 hours) at 03/11/2021 1654 Last data filed at 03/11/2021 0900 Gross per 24 hour  Intake 250 ml  Output 1047 ml  Net -797 ml   Filed Weights   03/10/21 0713 03/10/21 1722 03/11/21 0554  Weight: 99.8 kg 93.3 kg 92.3 kg    Examination: Constitutional: chronically ill  appearing male, NAD, calm, comfortable Eyes: PERRL, lids and conjunctivae normal ENMT: Mucous membranes are dry.  Posterior pharynx clear of any exudate or lesions. Poor dentition.  Neck: normal, supple, no masses, no thyromegaly Respiratory: Bibasilar crackles. Normal respiratory effort. No accessory muscle use.  Cardiovascular: normal s1, s2 sounds, no murmurs / rubs / gallops. No extremity edema. 2+ pedal pulses. No carotid bruits.  Abdomen: no tenderness, no masses palpated. No hepatosplenomegaly. Bowel sounds positive.  Musculoskeletal: no clubbing / cyanosis. No joint deformity upper and lower extremities. Good ROM, no contractures. Normal muscle tone.  Skin: no rashes, lesions, ulcers. No induration Neurologic: CN 2-12 grossly intact. Sensation intact, DTR normal. Strength 5/5 in all 4.  Psychiatric: Poor judgment and insight. Somnolent but arousable. Mood unable to determine.     Data Reviewed: I have personally reviewed following labs and imaging studies  CBC: Recent Labs  Lab 03/10/21 0853 03/11/21 0622  WBC 7.7 7.0  HGB 10.2* 9.8*  HCT 32.3* 31.8*  MCV 107.7* 107.1*  PLT 225 235    Basic Metabolic Panel: Recent Labs  Lab 03/10/21 0853 03/11/21 0622  NA 135 138  K 3.8 4.7  CL 97* 96*  CO2 24 26  GLUCOSE 118* 85  BUN 29* 27*  CREATININE 4.74* 4.07*  CALCIUM 8.6* 9.2  MG 2.2  --   PHOS  --  5.4*    GFR: Estimated Creatinine Clearance: 24.6 mL/min (A) (by C-G formula based on SCr of 4.07 mg/dL (H)).  Liver Function Tests: Recent Labs  Lab 03/10/21 0853 03/11/21 0622  AST 15  --   ALT 12  --   ALKPHOS 87  --   BILITOT 1.6*  --   PROT 6.7  --   ALBUMIN 3.0* 2.9*    CBG: No results for input(s): GLUCAP in the last 168 hours.  Recent Results (from the past 240 hour(s))  Resp Panel by RT-PCR (Flu A&B, Covid) Nasopharyngeal Swab     Status: None   Collection Time: 03/10/21  3:19 PM   Specimen: Nasopharyngeal Swab; Nasopharyngeal(NP) swabs in vial  transport medium  Result Value Ref Range Status   SARS Coronavirus 2 by RT PCR NEGATIVE NEGATIVE Final    Comment: (NOTE) SARS-CoV-2 target nucleic acids are NOT DETECTED.  The SARS-CoV-2 RNA is generally detectable in upper respiratory specimens during the acute phase of infection. The lowest concentration of SARS-CoV-2 viral copies this assay can detect is 138 copies/mL. A negative result does not preclude SARS-Cov-2 infection and should not be used as the sole basis for treatment or other patient management decisions. A negative result may occur with  improper specimen collection/handling, submission of specimen other than nasopharyngeal swab, presence of viral mutation(s) within the areas targeted by this assay, and inadequate number of viral copies(<138 copies/mL). A negative result must be combined with clinical observations, patient history, and epidemiological information. The expected result is Negative.  Fact Sheet for Patients:  EntrepreneurPulse.com.au  Fact Sheet for Healthcare Providers:  IncredibleEmployment.be  This test is no t yet approved or cleared by the [place] FDA and  has been authorized for detection and/or diagnosis of SARS-CoV-2 by FDA under  an Emergency Use Authorization (EUA). This EUA will remain  in effect (meaning this test can be used) for the duration of the COVID-19 declaration under Section 564(b)(1) of the Act, 21 U.S.C.section 360bbb-3(b)(1), unless the authorization is terminated  or revoked sooner.       Influenza A by PCR NEGATIVE NEGATIVE Final   Influenza B by PCR NEGATIVE NEGATIVE Final    Comment: (NOTE) The Xpert Xpress SARS-CoV-2/FLU/RSV plus assay is intended as an aid in the diagnosis of influenza from Nasopharyngeal swab specimens and should not be used as a sole basis for treatment. Nasal washings and aspirates are unacceptable for Xpert Xpress SARS-CoV-2/FLU/RSV testing.  Fact  Sheet for Patients: EntrepreneurPulse.com.au  Fact Sheet for Healthcare Providers: IncredibleEmployment.be  This test is not yet approved or cleared by the [place] FDA and has been authorized for detection and/or diagnosis of SARS-CoV-2 by FDA under an Emergency Use Authorization (EUA). This EUA will remain in effect (meaning this test can be used) for the duration of the COVID-19 declaration under Section 564(b)(1) of the Act, 21 U.S.C. section 360bbb-3(b)(1), unless the authorization is terminated or revoked.  Performed at [facility], [address], [place], [place] [address]      Radiology Studies: CT ABDOMEN PELVIS WO CONTRAST  Result Date: 03/10/2021 CLINICAL DATA:  Nausea vomiting with abdominal pain. EXAM: CT ABDOMEN AND PELVIS WITHOUT CONTRAST TECHNIQUE: Multidetector CT imaging of the abdomen and pelvis was performed following the standard protocol without IV contrast. RADIATION DOSE REDUCTION: This exam was performed according to the departmental dose-optimization program which includes automated exposure control, adjustment of the mA and/or kV according to patient size and/or use of iterative reconstruction technique. COMPARISON:  None. FINDINGS: Lower chest: Heart is enlarged. Interlobular septal thickening associated with diffuse hazy ground-glass opacity in both lung bases. Tiny right pleural effusion. Hepatobiliary: Liver measures 20.5 cm craniocaudal length. The liver shows diffusely decreased attenuation suggesting fat deposition. No focal abnormality in the liver on this study without intravenous contrast. Nonvisualization of the gallbladder is compatible with underdistention or surgical resection. No intrahepatic or extrahepatic biliary dilation. Pancreas: Diffuse fatty replacement.  No main duct dilatation. Spleen: No splenomegaly. No focal mass lesion. Adrenals/Urinary Tract: No adrenal nodule or mass. Both kidneys atrophic. No  hydronephrosis. No evidence for hydroureter. The urinary bladder appears normal for the degree of distention. Stomach/Bowel: Stomach is unremarkable. No gastric wall thickening. No evidence of outlet obstruction. Duodenum is normally positioned as is the ligament of Treitz. No small bowel wall thickening. No small bowel dilatation. The terminal ileum is normal. The appendix is normal. No gross colonic mass. No colonic wall thickening. Vascular/Lymphatic: There is moderate atherosclerotic calcification of the abdominal aorta without aneurysm. Haziness in the para-aortic retroperitoneal fat and anterior pararenal space is similar to prior. There is no gastrohepatic or hepatoduodenal ligament lymphadenopathy. No retroperitoneal or mesenteric lymphadenopathy. No pelvic sidewall lymphadenopathy. Reproductive: The prostate gland and seminal vesicles are unremarkable. Other: Trace intraperitoneal free fluid noted around the liver and spleen as well as in the pelvis. Haziness in the central small bowel mesentery is nonspecific. Diffuse body wall Musculoskeletal: No worrisome lytic or sclerotic osseous abnormality. Edema noted. IMPRESSION: 1. No acute findings in the abdomen or pelvis. Specifically, no findings to explain the patient's history of nausea and vomiting. 2. Hepatomegaly with hepatic steatosis. 3. Trace intraperitoneal free fluid with diffuse body wall edema. 4. Bilateral renal atrophy. 5. Interlobular septal thickening associated with diffuse hazy ground-glass opacity in both lung bases. Imaging features  suggest pulmonary edema. 6. Aortic Atherosclerosis (ICD10-I70.0). Electronically Signed   By: Misty Stanley M.D.   On: 03/10/2021 08:11   DG Chest Portable 1 View  Result Date: 03/10/2021 CLINICAL DATA:  Emesis. EXAM: PORTABLE CHEST 1 VIEW COMPARISON:  02/12/2021 FINDINGS: Heart size is mildly enlarged and stable. Atherosclerotic calcifications at the aortic arch. Prominent lung markings are unchanged. No  focal airspace disease or pulmonary edema. No acute bone abnormality. IMPRESSION: 1. Stable cardiomegaly. 2. Prominent lung markings appear chronic.  No acute chest findings. Electronically Signed   By: Markus Daft M.D.   On: 03/10/2021 07:47    Scheduled Meds:  apixaban  5 mg Oral BID   aspirin  81 mg Oral Daily   atorvastatin  10 mg Oral Q M,W,F   calcium acetate  667 mg Oral TID WC   calcium carbonate  800 mg of elemental calcium Oral BID   carvedilol  3.125 mg Oral BID WC   pantoprazole  40 mg Oral Daily   pneumococcal 23 valent vaccine  0.5 mL Intramuscular Tomorrow-1000   Continuous Infusions:  sodium chloride     sodium chloride     [START ON 03/12/2021] vancomycin       LOS: 0 days   Time spent: 37 mins   Tariyah Pendry Wynetta Emery, MD How to contact the [facility] Attending or Consulting provider [place] or covering provider during after hours [place], for this patient?  Check the care team in [facility] and look for a) attending/consulting [facility] provider listed and b) the [facility] team listed Log into [email or web address] and use [place]'s universal password to access. If you do not have the password, please contact the hospital operator. Locate the [facility] provider you are looking for under [facility] and page to a number that you can be directly reached. If you still have difficulty reaching the provider, please page the [facility] (Director on Call) for the Hospitalists listed on amion for assistance.  03/11/2021, 4:54 PM

## 2021-03-11 NOTE — Assessment & Plan Note (Signed)
-   He continues to refuse care, refusing to take apixaban and carvedilol -He has decisional capacity and was counseled on the risks and he verbalized understanding

## 2021-03-11 NOTE — Assessment & Plan Note (Signed)
-  Likely from uremia due to poor compliance with HD treatments -Fortunately symptoms improved after supportive treatments given overnight -Feeding trial today -He did receive hemodialysis last night and is back on schedule

## 2021-03-11 NOTE — Assessment & Plan Note (Signed)
-   Prognosis remains poor -He refuses to complete dialysis treatments and refuses care and medical recommendations -Palliative medicine consultation requested -Outpatient palliative services have been recommended

## 2021-03-12 DIAGNOSIS — E213 Hyperparathyroidism, unspecified: Secondary | ICD-10-CM

## 2021-03-12 DIAGNOSIS — Z89511 Acquired absence of right leg below knee: Secondary | ICD-10-CM

## 2021-03-12 DIAGNOSIS — Z89512 Acquired absence of left leg below knee: Secondary | ICD-10-CM

## 2021-03-12 MED ORDER — DARBEPOETIN ALFA 40 MCG/0.4ML IJ SOSY
40.0000 ug | PREFILLED_SYRINGE | INTRAMUSCULAR | Status: DC
  Filled 2021-03-12: qty 0.4

## 2021-03-12 MED ORDER — DARBEPOETIN ALFA 40 MCG/0.4ML IJ SOSY
PREFILLED_SYRINGE | INTRAMUSCULAR | Status: AC
  Administered 2021-03-12: 40 ug via INTRAVENOUS
  Filled 2021-03-12: qty 0.4

## 2021-03-12 NOTE — Discharge Instructions (Signed)
PLEASE HAVE YOUR STAPLES REMOVED AS SCHEDULED ON 03/18/21 AT VASCULAR CLINIC IN [place]   IMPORTANT INFORMATION: PAY CLOSE ATTENTION   PHYSICIAN DISCHARGE INSTRUCTIONS  Follow with Primary care provider  Monico Blitz, MD  and other consultants as instructed by your Hospitalist Physician  [place] IF SYMPTOMS COME BACK, WORSEN OR NEW PROBLEM DEVELOPS   Please note: You were cared for by a hospitalist during your hospital stay. Every effort will be made to forward records to your primary care provider.  You can request that your primary care provider send for your hospital records if they have not received them.  Once you are discharged, your primary care physician will handle any further medical issues. Please note that NO REFILLS for any discharge medications will be authorized once you are discharged, as it is imperative that you return to your primary care physician (or establish a relationship with a primary care physician if you do not have one) for your post hospital discharge needs so that they can reassess your need for medications and monitor your lab values.  Please get a complete blood count and chemistry panel checked by your Primary MD at your next visit, and again as instructed by your Primary MD.  Get Medicines reviewed and adjusted: Please take all your medications with you for your next visit with your Primary MD  Laboratory/radiological data: Please request your Primary MD to go over all hospital tests and procedure/radiological results at the follow up, please ask your primary care provider to get all Hospital records sent to his/her office.  In some cases, they will be blood work, cultures and biopsy results pending at the time of your discharge. Please request that your primary care provider follow up on these results.  If you are diabetic, please bring your blood sugar readings with you to your follow up appointment with primary care.     Please call and make your follow up appointments as soon as possible.    Also Note the following: If you experience worsening of your admission symptoms, develop shortness of breath, life threatening emergency, suicidal or homicidal thoughts you must seek medical attention immediately by calling 911 or calling your MD immediately  if symptoms less severe.  You must read complete instructions/literature along with all the possible adverse reactions/side effects for all the Medicines you take and that have been prescribed to you. Take any new Medicines after you have completely understood and accpet all the possible adverse reactions/side effects.   Do not drive when taking Pain medications or sleeping medications (Benzodiazepines)  Do not take more than prescribed Pain, Sleep and Anxiety Medications. It is not advisable to combine anxiety,sleep and pain medications without talking with your primary care practitioner  Special Instructions: If you have smoked or chewed Tobacco  in the last 2 yrs please stop smoking, stop any regular Alcohol  and or any Recreational drug use.  Wear Seat belts while driving.  Do not drive if taking any narcotic, mind altering or controlled substances or recreational drugs or alcohol.

## 2021-03-12 NOTE — Progress Notes (Signed)
Patient ID: CHAYNE BAUMGART, male   DOB: 1971-02-08, 51 y.o.   MRN: [id number] S: No new complaints.  Refused meds last night. O:BP 122/83 (BP Location: Right Arm)    Pulse 98    Temp 97.9 F (36.6 C) (Oral)    Resp 16    Ht 5\' 9"  (1.753 m)    Wt 92.6 kg    SpO2 93%    BMI 30.15 kg/m   Intake/Output Summary (Last 24 hours) at 03/12/2021 0904 Last data filed at 03/11/2021 2300 Gross per 24 hour  Intake 100 ml  Output --  Net 100 ml   Intake/Output: I/O last 3 completed shifts: In: 350 [P.O.:350] Out: 1047 [Other:1047]  Intake/Output this shift:  No intake/output data recorded. Weight change: -7.191 kg Gen:NAD CVS: RRR Resp:CTA Abd: +BS, soft, NT/ND Ext: s/p bilateral BKA's, LUE AVF +T/B  Recent Labs  Lab 03/10/21 0853 03/11/21 0622  NA 135 138  K 3.8 4.7  CL 97* 96*  CO2 24 26  GLUCOSE 118* 85  BUN 29* 27*  CREATININE 4.74* 4.07*  ALBUMIN 3.0* 2.9*  CALCIUM 8.6* 9.2  PHOS  --  5.4*  AST 15  --   ALT 12  --    Liver Function Tests: Recent Labs  Lab 03/10/21 0853 03/11/21 0622  AST 15  --   ALT 12  --   ALKPHOS 87  --   BILITOT 1.6*  --   PROT 6.7  --   ALBUMIN 3.0* 2.9*   Recent Labs  Lab 03/10/21 0853  LIPASE 24   No results for input(s): AMMONIA in the last 168 hours. CBC: Recent Labs  Lab 03/10/21 0853 03/11/21 0622  WBC 7.7 7.0  HGB 10.2* 9.8*  HCT 32.3* 31.8*  MCV 107.7* 107.1*  PLT 225 245   Cardiac Enzymes: No results for input(s): CKTOTAL, CKMB, CKMBINDEX, TROPONINI in the last 168 hours. CBG: No results for input(s): GLUCAP in the last 168 hours.  Iron Studies: No results for input(s): IRON, TIBC, TRANSFERRIN, FERRITIN in the last 72 hours. Studies/Results: No results found.  apixaban  5 mg Oral BID   aspirin  81 mg Oral Daily   atorvastatin  10 mg Oral Q M,W,F   calcium acetate  667 mg Oral TID WC   calcium carbonate  800 mg of elemental calcium Oral BID   carvedilol  3.125 mg Oral BID WC   pantoprazole  40 mg Oral Daily    pneumococcal 23 valent vaccine  0.5 mL Intramuscular Tomorrow-1000    BMET    Component Value Date/Time   NA 138 03/11/2021 0622   NA 142 12/16/2015 1051   K 4.7 03/11/2021 0622   CL 96 (L) 03/11/2021 0622   CO2 26 03/11/2021 0622   GLUCOSE 85 03/11/2021 0622   BUN 27 (H) 03/11/2021 0622   BUN 45 (H) 12/16/2015 1051   CREATININE 4.07 (H) 03/11/2021 0622   CALCIUM 9.2 03/11/2021 0622   GFRNONAA 17 (L) 03/11/2021 0622   GFRAA 9 (L) 09/27/2019 0951   CBC    Component Value Date/Time   WBC 7.0 03/11/2021 0622   RBC 2.97 (L) 03/11/2021 0622   HGB 9.8 (L) 03/11/2021 0622   HGB 10.4 (L) 06/02/2016 1107   HCT 31.8 (L) 03/11/2021 0622   HCT 32.9 (L) 06/02/2016 1107   PLT 245 03/11/2021 0622   PLT 389 (H) 06/02/2016 1107   MCV 107.1 (H) 03/11/2021 0622   MCV 94 06/02/2016 1107   MCH  33.0 03/11/2021 0622   MCHC 30.8 03/11/2021 0622   RDW 19.0 (H) 03/11/2021 0622   RDW 16.0 (H) 06/02/2016 1107   LYMPHSABS 0.6 (L) 02/21/2021 1345   LYMPHSABS 2.9 06/02/2016 1107   MONOABS 0.6 02/21/2021 1345   EOSABS 0.4 02/21/2021 1345   EOSABS 0.5 (H) 06/02/2016 1107   BASOSABS 0.0 02/21/2021 1345   BASOSABS 0.1 06/02/2016 1107    Dialysis Orders:  Center: [facility]  on MWF  4h 3min  95 kg   1K/2.25 bath  Hep 1000+ 500u/hr  LUE AVG  300/500   - prosthetic weighs 1.6 kg  - Epogen 1000   Units IV/HD     Assessment/Plan:  Intractable nause and vomiting - improved since admission with antiemetics.  ESRD -  tolerated HD well yesterday and will continue with MWF schedule.  Hypertension/volume  - stable  Anemia  - continue with ESA and follow H/H  Metabolic bone disease -  resume home meds  Nutrition -  renal diet  Disposition - pt has declined SNF and would like HHC.  SW/CM involved.  Donetta Potts, MD [employer] [phone]

## 2021-03-12 NOTE — Discharge Summary (Signed)
Physician Discharge Summary  Brendan Campbell TJQ:[id number] DOB: 1970/02/27 DOA: 03/10/2021  PCP: Monico Blitz, MD  Admit date: 03/10/2021 Discharge date: 03/12/2021  Admitted From:  Home  Disposition: Refused SNF (home)  Recommendations for Outpatient Follow-up:  Follow up with PCP in 1 weeks Resume regular MWF hemodialysis schedule Follow up with [facility] on 03/18/21 as scheduled for staple removal and wound check  Discharge Condition: STABLE   CODE STATUS: FULL DIET: Renal    Brief Hospitalization Summary: Please see all hospital notes, images, labs for full details of the hospitalization.  51 y.o. male with medical history significant of ESRD on HD MWF (poor compliance with treatments), type 2 DM with neuropathy, atrial fibrillation, hyperlipidemia, cardiomyopathy with EF 20%, right BKA, status post recent left BKA, severe peripheral vascular disease presented to the emergency department complaining of severe nausea and vomiting abdominal pain and diarrhea.  Patient reports that he last had a hemodialysis 2 days ago on Monday 03/08/21.  He did not go to dialysis today.  He reports that he has had loose bowel movements for the past several days.  He has been completing a course of antibiotics for MRSA infection.  He was supposed to be taking vancomycin treatments with hemodialysis through 03/15/2021.  He reports that he has had difficulty tolerating p.o. for the last 3 days.  He was treated in the emergency department with IV nausea medications.  He was sent for CT abdomen and pelvis but no acute findings.  Admission was requested for supportive management as he no longer can tolerate p.o. after treatment in the ED.   Hospital course by problem list   * Intractable nausea and vomiting- (present on admission) -Likely from uremia due to poor compliance with HD treatments -Fortunately symptoms improved after supportive treatments, IV nausea meds, hemodialysis  He is tolerating diet with no  emesis.  -He is on schedule for hemodialysis     ESRD on hemodialysis [facility]) - Appreciate nephrology consultation and recommendations -Patient received hemodialysis 2/1 and 2/3 and is back on schedule -he refused to complete full treatment on 2/1   Noncompliance - Prognosis remains poor -He refuses to complete dialysis treatments and refuses care and medical recommendations -Palliative medicine consultation requested -Outpatient palliative services have been recommended   S/P bilateral BKA (below knee amputation) ([place]) - He has been poorly compliant with caring for his recent left BKA wound -He now refuses to go to SNF and we are making arrangements to try and get him a personal care assistant at home -He does have decisional capacity at this time and he is adamantly refusing SNF placement -recent left BKA - wound healing, no s/s of infection.  PT SCHEDULED FOR STAPLE REMOVAL ON 03/18/21 AT [facility].    Type 2 diabetes mellitus with diabetic chronic kidney disease ([place])- (present on admission) - has been diet controlled, follow   Hyperlipidemia- (present on admission) - resume home meds if he will take them.    Chronic atrial fibrillation (HCC)- (present on admission) - He continues to refuse care, refusing to take apixaban and carvedilol -He has decisional capacity and was counseled on the risks and he verbalized understanding   Hyperparathyroidism ([place])- (present on admission) -stable at this time  Discharge Diagnoses:  Principal Problem:   Intractable nausea and vomiting Active Problems:   ESRD on hemodialysis ([place])   S/P bilateral BKA (below knee amputation) ([place])   Noncompliance   Type 2 diabetes mellitus with diabetic chronic kidney disease (  [place])   Hyperlipidemia   Neuropathy   Peripheral vascular disease (HCC)   Chronic atrial fibrillation (HCC)   Hyperparathyroidism (HCC)   Nausea and vomiting   Discharge Instructions:  Allergies as of 03/12/2021        Reactions   Tape Other (See Comments)   Pulls skin off   Chlorhexidine Gluconate [chlorhexidine] Rash        Medication List     STOP taking these medications    Veltassa 8.4 g packet Generic drug: patiromer       TAKE these medications    acetaminophen 325 MG tablet Commonly known as: TYLENOL Take 2 tablets (650 mg total) by mouth every 6 (six) hours as needed for mild pain, moderate pain or fever.   albuterol 108 (90 Base) MCG/ACT inhaler Commonly known as: VENTOLIN HFA Inhale 2 puffs into the lungs every 4 (four) hours as needed for wheezing or shortness of breath.   apixaban 5 MG Tabs tablet Commonly known as: ELIQUIS Take 1 tablet (5 mg total) by mouth 2 (two) times daily.   aspirin 81 MG chewable tablet Chew 81 mg by mouth daily.   atorvastatin 10 MG tablet Commonly known as: LIPITOR Take 1 tablet (10 mg total) by mouth every evening. What changed: additional instructions   butalbital-acetaminophen-caffeine 50-325-40 MG tablet Commonly known as: FIORICET Take 1 tablet by mouth every 6 (six) hours as needed for headache or migraine.   calcium acetate 667 MG capsule Commonly known as: PHOSLO Take 1 capsule (667 mg total) by mouth 3 (three) times daily with meals.   calcium carbonate 500 MG chewable tablet Commonly known as: TUMS - dosed in mg elemental calcium Chew 4 tablets (800 mg of elemental calcium total) by mouth 2 (two) times daily.   carvedilol 3.125 MG tablet Commonly known as: Coreg Take 1 tablet (3.125 mg total) by mouth 2 (two) times daily with a meal.   oxyCODONE 5 MG immediate release tablet Commonly known as: Oxy IR/ROXICODONE Take 1 tablet (5 mg total) by mouth every 6 (six) hours as needed for severe pain.   pantoprazole 20 MG tablet Commonly known as: PROTONIX Take 1 tablet (20 mg total) by mouth daily.   vancomycin 1-5 GM/200ML-% Soln Commonly known as: VANCOCIN Inject 200 mLs (1,000 mg total) into the vein every Monday,  Wednesday, and Friday with hemodialysis for 24 days.   Vitamin D3 25 MCG (1000 UT) Caps Take 1 capsule by mouth See admin instructions. Take before Dialysis on Monday Wednesday and Fridays        Follow-up Information     Monico Blitz, MD. Schedule an appointment as soon as possible for a visit in 1 week(s).   Specialty: Internal Medicine Why: Hospital Follow Up Contact information: [place] [address] [phone]         Arnoldo Lenis, MD .   Specialty: Cardiology Contact information: [address] [place] [address] [phone]         [facility]. Go on 03/18/2021.   Specialty: Vascular Surgery Why: FOR STAPLE REMOVAL AS SCHEDULED, For wound re-check Contact information: [place] [phone]               Allergies  Allergen Reactions   Tape Other (See Comments)    Pulls skin off   Chlorhexidine Gluconate [Chlorhexidine] Rash   Allergies as of 03/12/2021       Reactions   Tape Other (  See Comments)   Pulls skin off   Chlorhexidine Gluconate [chlorhexidine] Rash        Medication List     STOP taking these medications    Veltassa 8.4 g packet Generic drug: patiromer       TAKE these medications    acetaminophen 325 MG tablet Commonly known as: TYLENOL Take 2 tablets (650 mg total) by mouth every 6 (six) hours as needed for mild pain, moderate pain or fever.   albuterol 108 (90 Base) MCG/ACT inhaler Commonly known as: VENTOLIN HFA Inhale 2 puffs into the lungs every 4 (four) hours as needed for wheezing or shortness of breath.   apixaban 5 MG Tabs tablet Commonly known as: ELIQUIS Take 1 tablet (5 mg total) by mouth 2 (two) times daily.   aspirin 81 MG chewable tablet Chew 81 mg by mouth daily.   atorvastatin 10 MG tablet Commonly known as: LIPITOR Take 1 tablet (10 mg total) by mouth every evening. What changed: additional  instructions   butalbital-acetaminophen-caffeine 50-325-40 MG tablet Commonly known as: FIORICET Take 1 tablet by mouth every 6 (six) hours as needed for headache or migraine.   calcium acetate 667 MG capsule Commonly known as: PHOSLO Take 1 capsule (667 mg total) by mouth 3 (three) times daily with meals.   calcium carbonate 500 MG chewable tablet Commonly known as: TUMS - dosed in mg elemental calcium Chew 4 tablets (800 mg of elemental calcium total) by mouth 2 (two) times daily.   carvedilol 3.125 MG tablet Commonly known as: Coreg Take 1 tablet (3.125 mg total) by mouth 2 (two) times daily with a meal.   oxyCODONE 5 MG immediate release tablet Commonly known as: Oxy IR/ROXICODONE Take 1 tablet (5 mg total) by mouth every 6 (six) hours as needed for severe pain.   pantoprazole 20 MG tablet Commonly known as: PROTONIX Take 1 tablet (20 mg total) by mouth daily.   vancomycin 1-5 GM/200ML-% Soln Commonly known as: VANCOCIN Inject 200 mLs (1,000 mg total) into the vein every Monday, Wednesday, and Friday with hemodialysis for 24 days.   Vitamin D3 25 MCG (1000 UT) Caps Take 1 capsule by mouth See admin instructions. Take before Dialysis on Monday Wednesday and Fridays        Procedures/Studies: CT ABDOMEN PELVIS WO CONTRAST  Result Date: 03/10/2021 CLINICAL DATA:  Nausea vomiting with abdominal pain. EXAM: CT ABDOMEN AND PELVIS WITHOUT CONTRAST TECHNIQUE: Multidetector CT imaging of the abdomen and pelvis was performed following the standard protocol without IV contrast. RADIATION DOSE REDUCTION: This exam was performed according to the departmental dose-optimization program which includes automated exposure control, adjustment of the mA and/or kV according to patient size and/or use of iterative reconstruction technique. COMPARISON:  None. FINDINGS: Lower chest: Heart is enlarged. Interlobular septal thickening associated with diffuse hazy ground-glass opacity in both lung  bases. Tiny right pleural effusion. Hepatobiliary: Liver measures 20.5 cm craniocaudal length. The liver shows diffusely decreased attenuation suggesting fat deposition. No focal abnormality in the liver on this study without intravenous contrast. Nonvisualization of the gallbladder is compatible with underdistention or surgical resection. No intrahepatic or extrahepatic biliary dilation. Pancreas: Diffuse fatty replacement.  No main duct dilatation. Spleen: No splenomegaly. No focal mass lesion. Adrenals/Urinary Tract: No adrenal nodule or mass. Both kidneys atrophic. No hydronephrosis. No evidence for hydroureter. The urinary bladder appears normal for the degree of distention. Stomach/Bowel: Stomach is unremarkable. No gastric wall thickening. No evidence of outlet obstruction. Duodenum is normally positioned as  is the ligament of [place]. No small bowel wall thickening. No small bowel dilatation. The terminal ileum is normal. The appendix is normal. No gross colonic mass. No colonic wall thickening. Vascular/Lymphatic: There is moderate atherosclerotic calcification of the abdominal aorta without aneurysm. Haziness in the para-aortic retroperitoneal fat and anterior pararenal space is similar to prior. There is no gastrohepatic or hepatoduodenal ligament lymphadenopathy. No retroperitoneal or mesenteric lymphadenopathy. No pelvic sidewall lymphadenopathy. Reproductive: The prostate gland and seminal vesicles are unremarkable. Other: Trace intraperitoneal free fluid noted around the liver and spleen as well as in the pelvis. Haziness in the central small bowel mesentery is nonspecific. Diffuse body wall Musculoskeletal: No worrisome lytic or sclerotic osseous abnormality. Edema noted. IMPRESSION: 1. No acute findings in the abdomen or pelvis. Specifically, no findings to explain the patient's history of nausea and vomiting. 2. Hepatomegaly with hepatic steatosis. 3. Trace intraperitoneal free fluid with diffuse  body wall edema. 4. Bilateral renal atrophy. 5. Interlobular septal thickening associated with diffuse hazy ground-glass opacity in both lung bases. Imaging features suggest pulmonary edema. 6. Aortic Atherosclerosis (ICD10-I70.0). Electronically Signed   By: Misty Stanley M.D.   On: 03/10/2021 08:11   DG Abdomen 1 View  Result Date: 02/21/2021 CLINICAL DATA:  Concern for obstruction. EXAM: ABDOMEN - 1 VIEW COMPARISON:  None. FINDINGS: Nonobstructive bowel gas pattern. Formed stool in the left colon and rectum. No evidence of free air on this supine radiograph. IMPRESSION: Nonobstructive bowel gas pattern. Electronically Signed   By: Fidela Salisbury M.D.   On: 02/21/2021 13:32   CT ANGIO AO+BIFEM W & OR WO CONTRAST  Result Date: 02/12/2021 CLINICAL DATA:  Left foot pain for the past 3 weeks. Concern for ischemia affecting the left foot. EXAM: CT ANGIOGRAPHY OF ABDOMINAL AORTA WITH ILIOFEMORAL RUNOFF TECHNIQUE: Multidetector CT imaging of the abdomen, pelvis and lower extremities was performed using the standard protocol during bolus administration of intravenous contrast. Multiplanar CT image reconstructions and MIPs were obtained to evaluate the vascular anatomy. CONTRAST:  14mL OMNIPAQUE IOHEXOL 350 MG/ML SOLN COMPARISON:  None. FINDINGS: VASCULAR Aorta: Moderate amount of calcified atherosclerotic plaque within normal caliber abdominal aorta, not resulting in a hemodynamically significant stenosis. No evidence of abdominal aortic dissection or perivascular stranding. Celiac: Widely patent without hemodynamically significant narrowing. SMA: Widely patent without hemodynamically significant narrowing. A replaced right hepatic artery is incidentally noted to arise from the proximal SMA. The distal tributaries of the SMA appear widely patent without discrete lumen filling defect to suggest distal embolism. Renals: Duplicated left renal arteries, nearly co-dominant. All renal arteries are heavily diseased  with tandem areas of suspected hemodynamically significant narrowings, an expected finding given end-stage renal disease and associated bilateral renal atrophy. 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 _________________________________________________________ RIGHT Lower Extremity Inflow: There is a minimal amount of calcified atherosclerotic plaque involving the normal caliber right common iliac artery, not resulting in a hemodynamically significant stenosis. The right internal and external iliac arteries are mildly diseased though patent and of normal caliber. Outflow: There is mixed calcified and noncalcified atherosclerotic plaque involving the right common femoral artery, not resulting in hemodynamically significant stenosis. The right deep femoral artery is disease though patent of normal caliber. There is circumferential predominantly calcified atherosclerotic plaque throughout the right superficial femoral artery, which ultimately occludes at the level of the adductor canal. Both the right above and below-knee popliteal artery is occluded. Runoff: There is no significant tibial runoff to the residual  below-knee amputation, though evaluation degraded secondary to extensive mural calcifications. _________________________________________________________ LEFT Lower Extremity Inflow: There is a minimal amount of calcified atherosclerotic plaque involving the normal caliber left common iliac artery, not resulting in a hemodynamically significant stenosis. The left internal and external iliac arteries are mildly diseased though patent and of normal caliber. Outflow: There is a minimal amount of calcified atherosclerotic plaque involving the left common femoral artery, not resulting in hemodynamically significant stenosis. The left deep femoral artery is patent and of normal caliber. There is concentric predominantly calcified atherosclerotic plaque  throughout the left superficial femoral artery not definitely resulting in hemodynamically significant stenosis, though note, evaluation degraded secondary to extensive mural calcifications. There is a moderate to large amount of eccentric mixed calcified and noncalcified atherosclerotic plaque involving the left above knee popliteal artery resulting in tandem areas of at least 50% luminal narrowing (image 264, 287 and 293, series 5). The left below-knee popliteal artery appears patent without hemodynamically significant narrowing. Runoff: Apparent three-vessel runoff to the left lower leg, though note, evaluation degraded secondary to significant mural calcifications. A definitive left-sided dorsalis pedis artery is not identified. No discrete lumen filling defects to suggest distal embolism. Veins: Normal appearance of the IVC and pelvic venous system on this arterial phase examination. Review of the MIP images confirms the above findings. _________________________________________________________ _________________________________________________________ NON-VASCULAR Evaluation of abdominal organs is limited to the arterial phase of enhancement. Lower chest: Limited visualization of the lower thorax is negative for focal airspace opacity though does demonstrate mild interstitial thickening. No discrete focal airspace opacities. No pleural effusion. Cardiomegaly. Coronary artery calcifications. No pericardial effusion. Hepatobiliary: Normal hepatic contour. There is diffuse decreased attenuation of the hepatic parenchyma suggestive of hepatic steatosis. No discrete hyperenhancing hepatic lesions. Post cholecystectomy. No intra or extrahepatic biliary duct dilatation. Trace amount of perihepatic ascites. Pancreas: Pancreas is largely fatty replaced. Spleen: Normal appearance of the spleen. Adrenals/Urinary Tract: The bilateral kidneys are markedly atrophic compatible with known history of end-stage renal disease. No  urinary obstruction. There is mild thickening the left adrenal gland without discrete nodule. Normal appearance of the right adrenal gland. Diffuse thickening the urinary bladder wall, likely accentuated due to underdistention. Stomach/Bowel: Large colonic stool burden, particularly within the rectal vault. No evidence of enteric obstruction. Normal appearance of the terminal ileum and the retrocecal appendix. No hiatal hernia. No discrete areas of bowel wall thickening. No pneumoperitoneum, pneumatosis or portal venous gas. Lymphatic: No bulky retroperitoneal, mesenteric, pelvic or inguinal lymphadenopathy. Reproductive: Normal appearance the prostate gland. There is a small amount of fluid within the pelvic cul-de-sac. Other: Apparent 1.3 cm soft tissue ulcer involving the medial mid aspect of the left foot (image 459, series 5). No discrete areas of osteolysis to suggest osteomyelitis. Diffuse body wall anasarca. Musculoskeletal: No definite acute or aggressive osseous abnormalities. There is diffuse increased sclerosis of the imaged osseous structures suggestive of renal osteodystrophy. Prominent Schmorl's node are seen involving several vertebral bodies, most conspicuously involving L2. Patient is post right-sided below-knee amputation as well as amputation of the first and second digits. IMPRESSION: VASCULAR 1. Large amount of atherosclerotic plaque within normal caliber abdominal aorta, not resulting in a hemodynamically significant stenosis. Aortic aneurysm NOS (ICD10-I71.9). 2. Post right below-knee amputation with age-indeterminate though presumably chronic occlusion of the distal aspect of the right superficial femoral artery and the popliteal artery with no significant runoff to the residual stump, though evaluation degraded secondary to extensive mural calcifications. 3. Suspected tandem areas of hemodynamically significant narrowing involving  the left above knee popliteal artery. 4. Apparent  three-vessel runoff to the left lower leg, though note, evaluation degraded secondary to extensive mural calcifications. A discrete left-sided dorsalis pedis artery is not identified. No discrete intraluminal filling defects to suggest distal embolism. NON-VASCULAR 1. Apparent 1.3 cm soft tissue ulcer about the medial mid aspect of the left foot without discrete area of osteolysis to suggest osteomyelitis. Further evaluation with MRI could be performed as indicated. 2. Atrophic kidneys compatible with known history of end-stage renal disease. 3. Cardiomegaly with diffuse body wall anasarca and stigmata of pulmonary edema within the imaged lung bases. No pleural effusions. Electronically Signed   By: Sandi Mariscal M.D.   On: 02/12/2021 15:16   MR DHRCB RIGHT WO CONTRAST  Result Date: 02/16/2021 CLINICAL DATA:  Bacteremia. EXAM: MRI OF THE RIGHT FEMUR WITHOUT CONTRAST TECHNIQUE: Multiplanar, multisequence MR imaging of the right femur was performed. No intravenous contrast was administered. COMPARISON:  None. FINDINGS: Bones/Joint/Cartilage Marrow signal is within normal limits. No evidence of osteomyelitis. No appreciable hip joint effusion or evidence of septic arthritis. Ligaments Intact Muscles and Tendons Marked muscle edema prominent about rectus lateral talus with trace amount of fluid superficial to the vastus lateralis. There is also mild edema of the hamstring muscles. No intramuscular fluid collection or abscess. Soft tissues Marked skin thickening and subcutaneous soft tissue edema of bilateral lower extremities consistent with cellulitis. No drainable fluid collection or abscess. IMPRESSION: 1.  No evidence of osteomyelitis or septic arthritis. 2. Generalized edema prominent in the right vastus lateralis and about the hamstring muscles concerning for myositis. No drainable fluid collection or abscess. 3. Marked skin thickening and subcutaneous soft tissue edema consistent with diffuse cellulitis. No  drainable fluid collection or abscess. Electronically Signed   By: Keane Police D.O.   On: 02/16/2021 18:58   DG Chest Portable 1 View  Result Date: 03/10/2021 CLINICAL DATA:  Emesis. EXAM: PORTABLE CHEST 1 VIEW COMPARISON:  02/12/2021 FINDINGS: Heart size is mildly enlarged and stable. Atherosclerotic calcifications at the aortic arch. Prominent lung markings are unchanged. No focal airspace disease or pulmonary edema. No acute bone abnormality. IMPRESSION: 1. Stable cardiomegaly. 2. Prominent lung markings appear chronic.  No acute chest findings. Electronically Signed   By: Markus Daft M.D.   On: 03/10/2021 07:47   DG Chest Port 1 View  Result Date: 02/12/2021 CLINICAL DATA:  Shortness of breath. EXAM: PORTABLE CHEST 1 VIEW COMPARISON:  February 04, 2021. FINDINGS: Stable cardiomegaly. Mild central pulmonary vascular congestion is noted with probable minimal bilateral pulmonary edema. No consolidative process is noted. Bony thorax is unremarkable. IMPRESSION: Mild cardiomegaly with central pulmonary vascular congestion and probable minimal bilateral pulmonary edema. Electronically Signed   By: Marijo Conception M.D.   On: 02/12/2021 10:29   DG Foot Complete Left  Result Date: 02/12/2021 CLINICAL DATA:  Infection at the first metatarsophalangeal joint. 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 Osteotomy changes of the proximal phalanx of the third digit. Soft tissue swelling and vascular calcifications. No cortical erosion or periosteal reaction concerning for osteomyelitis. IMPRESSION: 1. Postsurgical changes as detailed above. 2. No radiographic evidence of osteomyelitis, if there is a clinical concern, MRI examination could be obtained for further evaluation. Electronically Signed   By: Keane Police D.O.   On: 02/12/2021 10:28   VAS [place] ABI WITH/WO TBI  Result Date:  02/15/2021  LOWER EXTREMITY DOPPLER STUDY Patient Name:  HUGH KAMARA  Date of Exam:   02/15/2021 Medical Rec #: [id number]       Accession #:    [id number] Date of Birth: 03/03/1970        Patient Gender: M Patient Age:   39 years Exam Location:  [facility] Procedure:      VAS [place] ABI WITH/WO TBI Referring Phys: JOSHUA ROBINS --------------------------------------------------------------------------------  Indications: Left Ischemia, critical limb. High Risk Factors: Hyperlipidemia, Diabetes.  Vascular Interventions: Rt bka done. Comparison Study: no prior Performing Technologist: Archie Patten RVS  Examination Guidelines: A complete evaluation includes at minimum, Doppler waveform signals and systolic blood pressure reading at the level of bilateral brachial, anterior tibial, and posterior tibial arteries, when vessel segments are accessible. Bilateral testing is considered an integral part of a complete examination. Photoelectric Plethysmograph (PPG) waveforms and toe systolic pressure readings are included as required and additional duplex testing as needed. Limited examinations for reoccurring indications may be performed as noted.  ABI Findings: +--------+------------------+-----+---------+--------+  Right    Rt Pressure (mmHg) Index Waveform  Comment   +--------+------------------+-----+---------+--------+  Brachial 123                      triphasic           +--------+------------------+-----+---------+--------+  PTA                                         bka       +--------+------------------+-----+---------+--------+  DP                                          bka       +--------+------------------+-----+---------+--------+ +---------+------------------+-----+--------+----------------------------+  Left      Lt Pressure (mmHg) Index Waveform Comment                       +---------+------------------+-----+--------+----------------------------+  Brachial                                    fistula                        +---------+------------------+-----+--------+----------------------------+  PTA       255                2.07  biphasic                               +---------+------------------+-----+--------+----------------------------+  DP        255                2.07  biphasic                               +---------+------------------+-----+--------+----------------------------+  Great Toe                                   great toe wound and bandaged  +---------+------------------+-----+--------+----------------------------+ +-------+-----------+-----------+------------+------------+  ABI/TBI Today's ABI Today's TBI  Previous ABI Previous TBI  +-------+-----------+-----------+------------+------------+  Right   bka                                                +-------+-----------+-----------+------------+------------+  Left    2.07                                               +-------+-----------+-----------+------------+------------+   Summary: Right: Bka. 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  *See table(s) above for measurements and observations.  Electronically signed by Servando Snare MD on 02/15/2021 at 5:54:07 PM.    Final      Subjective: Pt continues to refuse to take meds, refusing care, refused to complete HD treatment, he has been eating and drinking well for last 48 hours.  He refused SNF placement.  SW arranging home care aide.    Discharge Exam: Vitals:   03/11/21 1330 03/11/21 2041  BP: 119/80 122/83  Pulse: 97 98  Resp: 18 16  Temp: (!) 97.4 F (36.3 C) 97.9 F (36.6 C)  SpO2: 94% 93%   Vitals:   03/11/21 0554 03/11/21 1330 03/11/21 2041 03/12/21 0100  BP: 120/86 119/80 122/83   Pulse: 92 97 98   Resp: 17 18 16    Temp: 98.2 F (36.8 C) (!) 97.4 F (36.3 C) 97.9 F (36.6 C)   TempSrc:  Oral Oral   SpO2: 100% 94% 93%   Weight: 92.3 kg   92.6 kg  Height:       Constitutional: chronically ill appearing  male, NAD, calm, comfortable Eyes: PERRL, lids and conjunctivae normal ENMT: Mucous membranes are moist.  Posterior pharynx clear of any exudate or lesions. Poor dentition.  Neck: normal, supple, no masses, no thyromegaly Respiratory:  Normal respiratory effort. No accessory muscle use.  Cardiovascular: normal s1, s2 sounds, no murmurs / rubs / gallops. No extremity edema. 2+ pedal pulses. No carotid bruits.  Abdomen: no tenderness, no masses palpated. No hepatosplenomegaly. Bowel sounds positive.  Musculoskeletal: no clubbing / cyanosis. No joint deformity upper and lower extremities. Good ROM, no contractures. Normal muscle tone.  Skin: no rashes, lesions, ulcers. No induration Neurologic: CN 2-12 grossly intact. Sensation intact, DTR normal. Strength 5/5 in all 4.  Psychiatric: Poor judgment and insight. Mood flat affect.   The results of significant diagnostics from this hospitalization (including imaging, microbiology, ancillary and laboratory) are listed below for reference.     Microbiology: Recent Results (from the past 240 hour(s))  Resp Panel by RT-PCR (Flu A&B, Covid) Nasopharyngeal Swab     Status: None   Collection Time: 03/10/21  3:19 PM   Specimen: Nasopharyngeal Swab; Nasopharyngeal(NP) swabs in vial transport medium  Result Value Ref Range Status   SARS Coronavirus 2 by RT PCR NEGATIVE NEGATIVE Final    Comment: (NOTE) SARS-CoV-2 target nucleic acids are NOT DETECTED.  The SARS-CoV-2 RNA is generally detectable in upper respiratory specimens during the acute phase of infection. The lowest concentration of SARS-CoV-2 viral copies this assay can detect is 138 copies/mL. A negative result does not preclude SARS-Cov-2 infection and should not be used as the sole basis for treatment or other patient management decisions. A negative result may occur with  improper  specimen collection/handling, submission of specimen other than nasopharyngeal swab, presence of viral  mutation(s) within the areas targeted by this assay, and inadequate number of viral copies(<138 copies/mL). A negative result must be combined with clinical observations, patient history, and epidemiological information. The expected result is Negative.  Fact Sheet for Patients:  EntrepreneurPulse.com.au  Fact Sheet for Healthcare Providers:  IncredibleEmployment.be  This test is no t yet approved or cleared by the [place] FDA and  has been authorized for detection and/or diagnosis of SARS-CoV-2 by FDA under an Emergency Use Authorization (EUA). This EUA will remain  in effect (meaning this test can be used) for the duration of the COVID-19 declaration under Section 564(b)(1) of the Act, 21 U.S.C.section 360bbb-3(b)(1), unless the authorization is terminated  or revoked sooner.       Influenza A by PCR NEGATIVE NEGATIVE Final   Influenza B by PCR NEGATIVE NEGATIVE Final    Comment: (NOTE) The Xpert Xpress SARS-CoV-2/FLU/RSV plus assay is intended as an aid in the diagnosis of influenza from Nasopharyngeal swab specimens and should not be used as a sole basis for treatment. Nasal washings and aspirates are unacceptable for Xpert Xpress SARS-CoV-2/FLU/RSV testing.  Fact Sheet for Patients: EntrepreneurPulse.com.au  Fact Sheet for Healthcare Providers: IncredibleEmployment.be  This test is not yet approved or cleared by the [place] FDA and has been authorized for detection and/or diagnosis of SARS-CoV-2 by FDA under an Emergency Use Authorization (EUA). This EUA will remain in effect (meaning this test can be used) for the duration of the COVID-19 declaration under Section 564(b)(1) of the Act, 21 U.S.C. section 360bbb-3(b)(1), unless the authorization is terminated or revoked.  Performed at [facility], [address], [place], [place] [address]      Labs: BNP (last 3 results) Recent  Labs    07/20/20 0248 07/27/20 1857 02/12/21 1005  BNP 1,637.0* 2,631.0* >6,962.9*   Basic Metabolic Panel: Recent Labs  Lab 03/10/21 0853 03/11/21 0622  NA 135 138  K 3.8 4.7  CL 97* 96*  CO2 24 26  GLUCOSE 118* 85  BUN 29* 27*  CREATININE 4.74* 4.07*  CALCIUM 8.6* 9.2  MG 2.2  --   PHOS  --  5.4*   Liver Function Tests: Recent Labs  Lab 03/10/21 0853 03/11/21 0622  AST 15  --   ALT 12  --   ALKPHOS 87  --   BILITOT 1.6*  --   PROT 6.7  --   ALBUMIN 3.0* 2.9*   Recent Labs  Lab 03/10/21 0853  LIPASE 24   No results for input(s): AMMONIA in the last 168 hours. CBC: Recent Labs  Lab 03/10/21 0853 03/11/21 0622  WBC 7.7 7.0  HGB 10.2* 9.8*  HCT 32.3* 31.8*  MCV 107.7* 107.1*  PLT 225 245   Cardiac Enzymes: No results for input(s): CKTOTAL, CKMB, CKMBINDEX, TROPONINI in the last 168 hours. BNP: Invalid input(s): POCBNP CBG: No results for input(s): GLUCAP in the last 168 hours. D-Dimer No results for input(s): DDIMER in the last 72 hours. Hgb A1c No results for input(s): HGBA1C in the last 72 hours. Lipid Profile No results for input(s): CHOL, HDL, LDLCALC, TRIG, CHOLHDL, LDLDIRECT in the last 72 hours. Thyroid function studies No results for input(s): TSH, T4TOTAL, T3FREE, THYROIDAB in the last 72 hours.  Invalid input(s): FREET3 Anemia work up No results for input(s): VITAMINB12, FOLATE, FERRITIN, TIBC, IRON, RETICCTPCT in the last 72 hours. Urinalysis No results found for: COLORURINE, APPEARANCEUR, [place], [place], [place], [place],  BILIRUBINUR, KETONESUR, PROTEINUR, UROBILINOGEN, NITRITE, LEUKOCYTESUR Sepsis Labs Invalid input(s): PROCALCITONIN,  WBC,  LACTICIDVEN Microbiology Recent Results (from the past 240 hour(s))  Resp Panel by RT-PCR (Flu A&B, Covid) Nasopharyngeal Swab     Status: None   Collection Time: 03/10/21  3:19 PM   Specimen: Nasopharyngeal Swab; Nasopharyngeal(NP) swabs in vial transport medium  Result Value Ref Range  Status   SARS Coronavirus 2 by RT PCR NEGATIVE NEGATIVE Final    Comment: (NOTE) SARS-CoV-2 target nucleic acids are NOT DETECTED.  The SARS-CoV-2 RNA is generally detectable in upper respiratory specimens during the acute phase of infection. The lowest concentration of SARS-CoV-2 viral copies this assay can detect is 138 copies/mL. A negative result does not preclude SARS-Cov-2 infection and should not be used as the sole basis for treatment or other patient management decisions. A negative result may occur with  improper specimen collection/handling, submission of specimen other than nasopharyngeal swab, presence of viral mutation(s) within the areas targeted by this assay, and inadequate number of viral copies(<138 copies/mL). A negative result must be combined with clinical observations, patient history, and epidemiological information. The expected result is Negative.  Fact Sheet for Patients:  EntrepreneurPulse.com.au  Fact Sheet for Healthcare Providers:  IncredibleEmployment.be  This test is no t yet approved or cleared by the [place] FDA and  has been authorized for detection and/or diagnosis of SARS-CoV-2 by FDA under an Emergency Use Authorization (EUA). This EUA will remain  in effect (meaning this test can be used) for the duration of the COVID-19 declaration under Section 564(b)(1) of the Act, 21 U.S.C.section 360bbb-3(b)(1), unless the authorization is terminated  or revoked sooner.       Influenza A by PCR NEGATIVE NEGATIVE Final   Influenza B by PCR NEGATIVE NEGATIVE Final    Comment: (NOTE) The Xpert Xpress SARS-CoV-2/FLU/RSV plus assay is intended as an aid in the diagnosis of influenza from Nasopharyngeal swab specimens and should not be used as a sole basis for treatment. Nasal washings and aspirates are unacceptable for Xpert Xpress SARS-CoV-2/FLU/RSV testing.  Fact Sheet for  Patients: EntrepreneurPulse.com.au  Fact Sheet for Healthcare Providers: IncredibleEmployment.be  This test is not yet approved or cleared by the [place] FDA and has been authorized for detection and/or diagnosis of SARS-CoV-2 by FDA under an Emergency Use Authorization (EUA). This EUA will remain in effect (meaning this test can be used) for the duration of the COVID-19 declaration under Section 564(b)(1) of the Act, 21 U.S.C. section 360bbb-3(b)(1), unless the authorization is terminated or revoked.  Performed at [facility], [address], [place], [place] [address]    Time coordinating discharge:   SIGNED:  Irwin Brakeman, MD  [facility] 03/12/2021, 10:50 AM How to contact the [facility] Attending or Consulting provider [place] or covering provider during after hours [place], for this patient?  Check the care team in [facility] and look for a) attending/consulting TR[facility] provider listed and b) the [facility] team listed Log into [email or web address] and use [place]'s universal password to access. If you do not have the password, please contact the hospital operator. Locate the [facility] provider you are looking for under [facility] and page to a number that you can be directly reached. If you still have difficulty reaching the provider, please page the [facility] (Director on Call) for the Hospitalists listed on amion for assistance.

## 2021-03-12 NOTE — Procedures (Signed)
° °  HEMODIALYSIS TREATMENT NOTE:  Initially resistant about having HD today.  "I don't have nothin' on [IDWG] and I ain't had nothing to eat, I can't keep nothin" down."  Originally stated he was "only running 2 hours" but he agreed to treatment with minimal UF.  Also c/o pain in coccyx and lower back -- he is taking fentanyl and oxycodone consistently.  3.5 hour heparin-free treatment completed.  No UF, appears euvolemic and refused to dialyze if I attempted "to pull somethin' that ain't there.  I'll be the weakest man in the world!"  All blood was returned and hemostasis was achieved in 15 minutes.  No changes from pre-HD assessment.  Rockwell Alexandria, RN

## 2021-03-12 NOTE — Progress Notes (Signed)
Nsg Discharge Note  Admit Date:  03/10/2021 Discharge date: 03/12/2021   Brendan Campbell to be D/C'd Home per MD order.  AVS completed. Patient/caregiver able to verbalize understanding.  Discharge Medication: Allergies as of 03/12/2021       Reactions   Tape Other (See Comments)   Pulls skin off   Chlorhexidine Gluconate [chlorhexidine] Rash        Medication List     STOP taking these medications    Veltassa 8.4 g packet Generic drug: patiromer       TAKE these medications    acetaminophen 325 MG tablet Commonly known as: TYLENOL Take 2 tablets (650 mg total) by mouth every 6 (six) hours as needed for mild pain, moderate pain or fever.   albuterol 108 (90 Base) MCG/ACT inhaler Commonly known as: VENTOLIN HFA Inhale 2 puffs into the lungs every 4 (four) hours as needed for wheezing or shortness of breath.   apixaban 5 MG Tabs tablet Commonly known as: ELIQUIS Take 1 tablet (5 mg total) by mouth 2 (two) times daily.   aspirin 81 MG chewable tablet Chew 81 mg by mouth daily.   atorvastatin 10 MG tablet Commonly known as: LIPITOR Take 1 tablet (10 mg total) by mouth every evening. What changed: additional instructions   butalbital-acetaminophen-caffeine 50-325-40 MG tablet Commonly known as: FIORICET Take 1 tablet by mouth every 6 (six) hours as needed for headache or migraine.   calcium acetate 667 MG capsule Commonly known as: PHOSLO Take 1 capsule (667 mg total) by mouth 3 (three) times daily with meals.   calcium carbonate 500 MG chewable tablet Commonly known as: TUMS - dosed in mg elemental calcium Chew 4 tablets (800 mg of elemental calcium total) by mouth 2 (two) times daily.   carvedilol 3.125 MG tablet Commonly known as: Coreg Take 1 tablet (3.125 mg total) by mouth 2 (two) times daily with a meal.   oxyCODONE 5 MG immediate release tablet Commonly known as: Oxy IR/ROXICODONE Take 1 tablet (5 mg total) by mouth every 6 (six) hours as needed for  severe pain.   pantoprazole 20 MG tablet Commonly known as: PROTONIX Take 1 tablet (20 mg total) by mouth daily.   vancomycin 1-5 GM/200ML-% Soln Commonly known as: VANCOCIN Inject 200 mLs (1,000 mg total) into the vein every Monday, Wednesday, and Friday with hemodialysis for 24 days.   Vitamin D3 25 MCG (1000 UT) Caps Take 1 capsule by mouth See admin instructions. Take before Dialysis on Monday Wednesday and Fridays        Discharge Assessment: Vitals:   03/12/21 1400 03/12/21 1430  BP: 123/79 100/69  Pulse: 93 90  Resp:    Temp:    SpO2:     Skin clean, dry and intact without evidence of skin break down, no evidence of skin tears noted. IV catheter discontinued intact. Site without signs and symptoms of complications - no redness or edema noted at insertion site, patient denies c/o pain - only slight tenderness at site.  Dressing with slight pressure applied.  D/c Instructions-Education: Discharge instructions given to patient/family with verbalized understanding. D/c education completed with patient/family including follow up instructions, medication list, d/c activities limitations if indicated, with other d/c instructions as indicated by MD - patient able to verbalize understanding, all questions fully answered. Patient instructed to return to ED, call 911, or call MD for any changes in condition.  Patient escorted via [place], and D/C home via private auto.  Kathie Rhodes,  RN 03/12/2021 2:39 PM

## 2021-03-12 NOTE — TOC Transition Note (Signed)
Transition of Care [facility]) - CM/SW Discharge Note   Patient Details  Name: Brendan Campbell MRN: [id number] Date of Birth: 1971/01/07  [facility] CM/SW Contact:  Shade Flood, LCSW Phone Number: 03/12/2021, 11:27 AM   Clinical Narrative:     Pt stable for dc today after HD per MD. Met with pt to review dc planning. Pt continues to state he will return home at dc. Updated pt on referral to [facility] for in home assistance at dc. Assessment completed today over the phone. Also referred pt for outpatient palliative services as requested.  Pt will transport home with EMS. There are no other TOC needs for dc.  Final next level of care: Home/Self Care Barriers to Discharge: Barriers Resolved   Patient Goals and CMS Choice Patient states their goals for this hospitalization and ongoing recovery are:: go home      Discharge Placement                       Discharge Plan and Services In-house Referral: Clinical Social Work                                   Social Determinants of Health (SDOH) Interventions     Readmission Risk Interventions Readmission Risk Prevention Plan 07/21/2020  Transportation Screening Complete  PCP or Specialist Appt within 3-5 Days Complete  HRI or [place] Complete  Social Work Consult for [place] Planning/Counseling Complete  Palliative Care Screening Not Applicable  Medication Review Press photographer) Complete  Some recent data might be hidden

## 2021-03-18 ENCOUNTER — Other Ambulatory Visit: Payer: Self-pay

## 2021-03-18 ENCOUNTER — Ambulatory Visit (INDEPENDENT_AMBULATORY_CARE_PROVIDER_SITE_OTHER): Payer: Medicaid Other | Admitting: Physician Assistant

## 2021-03-18 ENCOUNTER — Encounter: Payer: Self-pay | Admitting: Physician Assistant

## 2021-03-18 VITALS — BP 101/71 | HR 97 | Temp 97.2°F | Resp 20

## 2021-03-18 DIAGNOSIS — Z89512 Acquired absence of left leg below knee: Secondary | ICD-10-CM

## 2021-03-18 MED ORDER — CEPHALEXIN 500 MG PO CAPS: 500.0000 mg | ORAL_CAPSULE | Freq: Two times a day (BID) | ORAL | 0 refills | Status: DC

## 2021-03-18 NOTE — Progress Notes (Signed)
POST OPERATIVE OFFICE NOTE    CC:  F/u for surgery  HPI:  This is a 51 y.o. 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 Virl Cagey.    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   Dialysis access hx: -BVT - left 2019 -LUA AVG 2019 by Dr. Donzetta Matters -venoplasty 2020  Pt returns today for follow up.  Pt states he is nervous about the staples coming out and wants some numbing medication.    He denies any fever.  He states he has had occasional chills but doesn't think it is coming from his leg.  He states there has not been any drainage from the incision.  He states he has occasional pain in it.    Allergies  Allergen Reactions   Tape Other (See Comments)    Pulls skin off   Chlorhexidine Gluconate [Chlorhexidine] Rash    Current Outpatient Medications  Medication Sig Dispense Refill   acetaminophen (TYLENOL) 325 MG tablet Take 2 tablets (650 mg total) by mouth every 6 (six) hours as needed for mild pain, moderate pain or fever.     albuterol (VENTOLIN HFA) 108 (90 Base) MCG/ACT inhaler Inhale 2 puffs into the lungs every 4 (four) hours as needed for wheezing or shortness of breath. 8 g 1   apixaban (ELIQUIS) 5 MG TABS tablet Take 1 tablet (5 mg total) by mouth 2 (two) times daily. 180 tablet 3   aspirin 81 MG chewable tablet Chew 81 mg by mouth daily.     atorvastatin (LIPITOR) 10 MG tablet Take 1 tablet (10 mg total) by mouth every evening. (Patient taking differently: Take 10 mg by mouth every evening. Take 1 tablet on Monday, Wednesday and Friday.) 90 tablet 2   butalbital-acetaminophen-caffeine (FIORICET) 50-325-40 MG tablet Take 1 tablet by mouth every 6 (six) hours as needed for headache or migraine. 14 tablet 0   calcium acetate (PHOSLO) 667 MG capsule Take 1 capsule (667 mg total) by mouth 3 (three) times daily with meals. 90 capsule 2   calcium carbonate (TUMS - DOSED IN MG ELEMENTAL CALCIUM) 500 MG chewable tablet Chew 4 tablets (800 mg  of elemental calcium total) by mouth 2 (two) times daily. 60 tablet 2   Cholecalciferol (VITAMIN D3) 25 MCG (1000 UT) CAPS Take 1 capsule by mouth See admin instructions. Take before Dialysis on Monday Wednesday and Fridays     oxyCODONE (OXY IR/ROXICODONE) 5 MG immediate release tablet Take 1 tablet (5 mg total) by mouth every 6 (six) hours as needed for severe pain. 20 tablet 0   pantoprazole (PROTONIX) 20 MG tablet Take 1 tablet (20 mg total) by mouth daily. 30 tablet 1   carvedilol (COREG) 3.125 MG tablet Take 1 tablet (3.125 mg total) by mouth 2 (two) times daily with a meal. (Patient not taking: Reported on 12/07/2020) 180 tablet 3   No current facility-administered medications for this visit.     ROS:  See HPI  Physical Exam:  Today's Vitals   03/18/21 1124  BP: 101/71  Pulse: 97  Resp: 20  Temp: (!) 97.2 F (36.2 C)  TempSrc: Temporal  SpO2: 98%  PainSc: 8    There is no height or weight on file to calculate BMI.   Incision:         Assessment/Plan:  This is a 51 y.o. male who is s/p: left BKA on 02/16/2021 by Dr. Virl Cagey.   -pt seen and  examined with Dr. Scot Dock.  Incision is slow to heal.  There is a small opening on the medial aspect of incision.  I did probe this and it is very superficial and did not track.  There is some erythema around the incision.  Concerned about removing staples and causing dehiscence.  Dr. Scot Dock recommended leaving staples and starting on po abx and following up in a couple of weeks for wound check.  Discussed with pt to wash with soap and water daily and pat dry.  He will call back sooner if there are any issues before then.  -keflex 500mg  bid #20 sent to pharmacy.    Leontine Locket, [facility] [phone]   Clinic MD:  Scot Dock

## 2021-03-26 ENCOUNTER — Other Ambulatory Visit: Payer: Self-pay

## 2021-03-26 ENCOUNTER — Emergency Department (HOSPITAL_COMMUNITY)
Admission: EM | Admit: 2021-03-26 | Discharge: 2021-03-27 | Disposition: A | Discharge status: Home / Self Care | Payer: Medicaid Other | Attending: Emergency Medicine | Admitting: Emergency Medicine

## 2021-03-26 ENCOUNTER — Encounter (HOSPITAL_COMMUNITY): Payer: Self-pay | Admitting: Emergency Medicine

## 2021-03-26 DIAGNOSIS — I5040 Unspecified combined systolic (congestive) and diastolic (congestive) heart failure: Secondary | ICD-10-CM | POA: Insufficient documentation

## 2021-03-26 DIAGNOSIS — Z7901 Long term (current) use of anticoagulants: Secondary | ICD-10-CM

## 2021-03-26 DIAGNOSIS — Z7982 Long term (current) use of aspirin: Secondary | ICD-10-CM | POA: Insufficient documentation

## 2021-03-26 DIAGNOSIS — I4891 Unspecified atrial fibrillation: Secondary | ICD-10-CM | POA: Diagnosis not present

## 2021-03-26 DIAGNOSIS — Z992 Dependence on renal dialysis: Secondary | ICD-10-CM | POA: Diagnosis not present

## 2021-03-26 DIAGNOSIS — T8789 Other complications of amputation stump: Secondary | ICD-10-CM | POA: Insufficient documentation

## 2021-03-26 DIAGNOSIS — S8992XA Unspecified injury of left lower leg, initial encounter: Secondary | ICD-10-CM | POA: Diagnosis present

## 2021-03-26 DIAGNOSIS — N186 End stage renal disease: Secondary | ICD-10-CM | POA: Diagnosis not present

## 2021-03-26 DIAGNOSIS — S81802A Unspecified open wound, left lower leg, initial encounter: Secondary | ICD-10-CM | POA: Diagnosis not present

## 2021-03-26 DIAGNOSIS — E1122 Type 2 diabetes mellitus with diabetic chronic kidney disease: Secondary | ICD-10-CM | POA: Diagnosis not present

## 2021-03-26 DIAGNOSIS — X58XXXA Exposure to other specified factors, initial encounter: Secondary | ICD-10-CM | POA: Diagnosis not present

## 2021-03-26 DIAGNOSIS — T148XXA Other injury of unspecified body region, initial encounter: Secondary | ICD-10-CM

## 2021-03-26 DIAGNOSIS — L089 Local infection of the skin and subcutaneous tissue, unspecified: Secondary | ICD-10-CM

## 2021-03-26 LAB — CBC WITH DIFFERENTIAL/PLATELET
Abs Immature Granulocytes: 0.06 10*3/uL (ref 0.00–0.07)
Basophils Absolute: 0.1 10*3/uL (ref 0.0–0.1)
Basophils Relative: 1 %
Eosinophils Absolute: 0.2 10*3/uL (ref 0.0–0.5)
Eosinophils Relative: 3 %
HCT: 32.1 % — ABNORMAL LOW (ref 39.0–52.0)
Hemoglobin: 10.4 g/dL — ABNORMAL LOW (ref 13.0–17.0)
Immature Granulocytes: 1 %
Lymphocytes Relative: 10 %
Lymphs Abs: 0.9 10*3/uL (ref 0.7–4.0)
MCH: 33.8 pg (ref 26.0–34.0)
MCHC: 32.4 g/dL (ref 30.0–36.0)
MCV: 104.2 fL — ABNORMAL HIGH (ref 80.0–100.0)
Monocytes Absolute: 0.4 10*3/uL (ref 0.1–1.0)
Monocytes Relative: 5 %
Neutro Abs: 7.3 10*3/uL (ref 1.7–7.7)
Neutrophils Relative %: 80 %
Platelets: 208 10*3/uL (ref 150–400)
RBC: 3.08 MIL/uL — ABNORMAL LOW (ref 4.22–5.81)
RDW: 18.7 % — ABNORMAL HIGH (ref 11.5–15.5)
WBC: 8.9 10*3/uL (ref 4.0–10.5)
nRBC: 0.4 % — ABNORMAL HIGH (ref 0.0–0.2)

## 2021-03-26 NOTE — ED Triage Notes (Signed)
Pt brought in by [facility] for evaluation of possible infection to bilateral BKA stumps.

## 2021-03-27 LAB — BASIC METABOLIC PANEL
Anion gap: 16 — ABNORMAL HIGH (ref 5–15)
BUN: 34 mg/dL — ABNORMAL HIGH (ref 6–20)
CO2: 24 mmol/L (ref 22–32)
Calcium: 8.1 mg/dL — ABNORMAL LOW (ref 8.9–10.3)
Chloride: 96 mmol/L — ABNORMAL LOW (ref 98–111)
Creatinine, Ser: 4.92 mg/dL — ABNORMAL HIGH (ref 0.61–1.24)
GFR, Estimated: 14 mL/min — ABNORMAL LOW (ref >60)
Glucose, Bld: 79 mg/dL (ref 70–99)
Potassium: 3.9 mmol/L (ref 3.5–5.1)
Sodium: 136 mmol/L (ref 135–145)

## 2021-03-27 MED ORDER — TRAMADOL HCL 50 MG PO TABS
50.0000 mg | ORAL_TABLET | Freq: Once | ORAL | Status: AC
  Administered 2021-03-27: 50 mg via ORAL
  Filled 2021-03-27: qty 1

## 2021-03-27 MED ORDER — TRAMADOL HCL 50 MG PO TABS: 50.0000 mg | ORAL_TABLET | Freq: Two times a day (BID) | ORAL | 0 refills | Status: DC | PRN

## 2021-03-27 MED ORDER — CEPHALEXIN 500 MG PO CAPS: 500.0000 mg | ORAL_CAPSULE | Freq: Once | ORAL | Status: DC

## 2021-03-27 NOTE — Discharge Instructions (Signed)
Please continue taking the antibiotic which was prescribed for you by the vascular surgeon.  Continue the wound care as recommended by them.  Keep your scheduled follow-up visit.  The surgeon will decide when the appropriate time to remove the staples.

## 2021-03-27 NOTE — ED Provider Notes (Signed)
[facility] EMERGENCY DEPARTMENT Provider Note   CSN: [id number] Arrival date & time: 03/26/21  2210     History  Chief Complaint  Patient presents with   Wound Infection    Brendan Campbell is a 51 y.o. male.  The history is provided by the patient.  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  He had followed up with his surgeon for staple removal, but was told that it was not time and he was given a prescription for cephalexin because of possible wound infection.  He did did not get the prescription filled until earlier today and took the first dose of the cephalexin today.  He is complaining of pain at the suture line.  He is also complaining of pain in the other leg which also has a below the knee amputation.  He states that because of the new surgery, he has been putting increased pressure on his other leg.  He had run out of his postoperative pain medication and has only been taking acetaminophen which is not giving him relief.  He did talk with his primary care provider who gave him a prescription for a muscle relaxer which does help him rest, but does not help with pain.  He denies fever or chills and denies any drainage from the wound.   Home Medications Prior to Admission medications   Medication Sig Start Date End Date Taking? Authorizing Provider  traMADol (ULTRAM) 50 MG tablet Take 1 tablet (50 mg total) by mouth every 12 (twelve) hours as needed. 6/81/15  Yes Delora Fuel, MD  acetaminophen (TYLENOL) 325 MG tablet Take 2 tablets (650 mg total) by mouth every 6 (six) hours as needed for mild pain, moderate pain or fever. 02/06/21   Cherene Altes, MD  albuterol (VENTOLIN HFA) 108 (90 Base) MCG/ACT inhaler Inhale 2 puffs into the lungs every 4 (four) hours as  needed for wheezing or shortness of breath. 07/21/20   Manuella Ghazi, Pratik D, DO  apixaban (ELIQUIS) 5 MG TABS tablet Take 1 tablet (5 mg total) by mouth 2 (two) times daily. 08/23/19   Strader, Fransisco Hertz, PA-C  aspirin 81 MG chewable tablet Chew 81 mg by mouth daily.    [provider]  atorvastatin (LIPITOR) 10 MG tablet Take 1 tablet (10 mg total) by mouth every evening. Patient taking differently: Take 10 mg by mouth every evening. Take 1 tablet on Monday, Wednesday and Friday. 08/03/19   Barton Dubois, MD  butalbital-acetaminophen-caffeine (FIORICET) [id number] MG tablet Take 1 tablet by mouth every 6 (six) hours as needed for headache or migraine. 07/21/20   Manuella Ghazi, Pratik D, DO  calcium acetate (PHOSLO) 667 MG capsule Take 1 capsule (667 mg total) by mouth 3 (three) times daily with meals. 02/21/19   Donnamae Jude, MD  calcium carbonate (TUMS - DOSED IN MG ELEMENTAL CALCIUM) 500 MG chewable tablet Chew 4 tablets (800 mg of elemental calcium total) by mouth 2 (two) times daily. 08/03/19   Barton Dubois, MD  carvedilol (COREG) 3.125 MG tablet Take 1 tablet (3.125 mg total) by mouth 2 (two) times daily with a meal. Patient not taking: Reported on 12/07/2020 04/09/20 11/26/20  Deatra Ellison, MD  cephALEXin (KEFLEX) 500 MG capsule Take 1 capsule (500 mg total) by mouth 2 (two) times daily. 03/18/21   Gabriel Earing,  PA-C  Cholecalciferol (VITAMIN D3) 25 MCG (1000 UT) CAPS Take 1 capsule by mouth See admin instructions. Take before Dialysis on Monday Wednesday and Fridays    [provider]  oxyCODONE (OXY IR/ROXICODONE) 5 MG immediate release tablet Take 1 tablet (5 mg total) by mouth every 6 (six) hours as needed for severe pain. 02/19/21   Rhyne, Hulen Shouts, PA-C  pantoprazole (PROTONIX) 20 MG tablet Take 1 tablet (20 mg total) by mouth daily. 11/26/20   Milton Ferguson, MD      Allergies    Tape and Chlorhexidine gluconate [chlorhexidine]    Review of Systems   Review of Systems   All other systems reviewed and are negative.  Physical Exam Updated Vital Signs BP (!) 107/92    Pulse 92    Temp 97.7 F (36.5 C) (Oral)    Resp 18    Ht 4\' 3"  (1.295 m)    Wt 93 kg    SpO2 98%    BMI 55.42 kg/m  Physical Exam Vitals and nursing note reviewed.  51 year old male, resting comfortably and in no acute distress. Vital signs are normal. Oxygen saturation is 98%, which is normal. Head is normocephalic and atraumatic. PERRLA, EOMI. Oropharynx is clear. Neck is nontender and supple without adenopathy or JVD. Back is nontender and there is no CVA tenderness. Lungs are clear without rales, wheezes, or rhonchi. Chest is nontender. Heart has regular rate and rhythm without murmur. Abdomen is soft, flat, nontender. Extremities: Bilateral below the knee amputations.  Staples are present in the wound of the stump of the left leg with mild erythema of the surrounding soft tissues.  There does not appear to be full healing of the incision and it looks like it would dehisce if staples are removed.  There is no warmth and no drainage.  The right stump has mild erythema and an area of skin breakdown but no drainage.  There is no warmth of either stump.  AV fistula is present in the left upper arm with thrill present. Skin is warm and dry without other rash. Neurologic: Mental status is normal, cranial nerves are intact, moves all extremities equally .  Left leg   Right leg  ED Results / Procedures / Treatments   Labs (all labs ordered are listed, but only abnormal results are displayed) Labs Reviewed  CBC WITH DIFFERENTIAL/PLATELET - Abnormal; Notable for the following components:      Result Value   RBC 3.08 (*)    Hemoglobin 10.4 (*)    HCT 32.1 (*)    MCV 104.2 (*)    RDW 18.7 (*)    nRBC 0.4 (*)    All other components within normal limits  BASIC METABOLIC PANEL - Abnormal; Notable for the following components:   Chloride 96 (*)    BUN 34 (*)    Creatinine, Ser 4.92 (*)     Calcium 8.1 (*)    GFR, Estimated 14 (*)    Anion gap 16 (*)    All other components within normal limits   Procedures Procedures    Medications Ordered in ED Medications  traMADol (ULTRAM) tablet 50 mg (has no administration in time range)    ED Course/ Medical Decision Making/ A&P                           Medical Decision Making Amount and/or Complexity of Data Reviewed Labs: ordered.  Risk Prescription drug management.  Postoperative wound infection, medication noncompliance with antibiotics.  Old records are reviewed showing left below the knee amputation done on 02/16/2021, office visit on 03/18/2021 where wound infection was identified and cephalexin ordered.  Patient is reassured that it is not time for staples to come out, infection is low-grade and he should continue taking the cephalexin which she just started taking.  He is to keep his scheduled follow-up appointment with his vascular surgeon.        Final Clinical Impression(s) / ED Diagnoses Final diagnoses:  Wound infection  End-stage renal disease on hemodialysis ([place])  Chronic anticoagulation    Rx / DC Orders ED Discharge Orders          Ordered    traMADol (ULTRAM) 50 MG tablet  Every 12 hours PRN        03/27/21 9480              Delora Fuel, MD 16[birth year]/37 [phone]

## 2021-03-27 NOTE — ED Notes (Signed)
Pt awake now, requesting crackers and beverage- pt given water and crackers

## 2021-03-31 ENCOUNTER — Other Ambulatory Visit: Payer: Self-pay

## 2021-03-31 ENCOUNTER — Ambulatory Visit (INDEPENDENT_AMBULATORY_CARE_PROVIDER_SITE_OTHER): Payer: Medicaid Other | Admitting: Physician Assistant

## 2021-03-31 VITALS — BP 108/65 | HR 73 | Temp 97.3°F | Resp 20

## 2021-03-31 DIAGNOSIS — Z89511 Acquired absence of right leg below knee: Secondary | ICD-10-CM

## 2021-03-31 DIAGNOSIS — Z89512 Acquired absence of left leg below knee: Secondary | ICD-10-CM

## 2021-03-31 NOTE — Progress Notes (Signed)
POST OPERATIVE OFFICE NOTE    CC:  F/u for surgery  HPI:  This is a 51 y.o. male who is s/p left BKA on 02/16/21 by Dr. Virl Cagey. Patient was last seen on 03/18/21 and left BKA had erythema. There were concerns about the stump dehiscence if the staples were removed so they were kept for another several weeks. Patient was given instructions for incision care and also Rx for Keflex. Patient waited a week before getting antibiotics. He then also presented to ED on 2/17 because he was having pain in his legs and he ran out of pain medicine. Reported that PCP had given him muscle relaxer but it was not helping.   Today he reports some pain in both legs but says Tramadol given in ED has helped. Mostly concerned about appearance of is left BKA and worried about it falling apart. Denies any drainage from left BKA. Reports draining from right BKA. Says he cleans both stumps by himself with soap and water. Has no assistance at home but says he is in process of getting nurse aide to help him. He is taking Keflex has enough for 5 more days. He denies any fever or chills.   Allergies  Allergen Reactions   Tape Other (See Comments)    Pulls skin off   Chlorhexidine Gluconate [Chlorhexidine] Rash    Current Outpatient Medications  Medication Sig Dispense Refill   acetaminophen (TYLENOL) 325 MG tablet Take 2 tablets (650 mg total) by mouth every 6 (six) hours as needed for mild pain, moderate pain or fever.     albuterol (VENTOLIN HFA) 108 (90 Base) MCG/ACT inhaler Inhale 2 puffs into the lungs every 4 (four) hours as needed for wheezing or shortness of breath. 8 g 1   amiodarone (PACERONE) 200 MG tablet Take 200 mg by mouth daily.     apixaban (ELIQUIS) 5 MG TABS tablet Take 1 tablet (5 mg total) by mouth 2 (two) times daily. 180 tablet 3   aspirin 81 MG chewable tablet Chew 81 mg by mouth daily.     atorvastatin (LIPITOR) 10 MG tablet Take 1 tablet (10 mg total) by mouth every evening. (Patient taking  differently: Take 10 mg by mouth every evening. Take 1 tablet on Monday, Wednesday and Friday.) 90 tablet 2   butalbital-acetaminophen-caffeine (FIORICET) 50-325-40 MG tablet Take 1 tablet by mouth every 6 (six) hours as needed for headache or migraine. 14 tablet 0   calcium acetate (PHOSLO) 667 MG capsule Take 1 capsule (667 mg total) by mouth 3 (three) times daily with meals. 90 capsule 2   calcium carbonate (TUMS - DOSED IN MG ELEMENTAL CALCIUM) 500 MG chewable tablet Chew 4 tablets (800 mg of elemental calcium total) by mouth 2 (two) times daily. 60 tablet 2   cephALEXin (KEFLEX) 500 MG capsule Take 1 capsule (500 mg total) by mouth 2 (two) times daily. 20 capsule 0   Cholecalciferol (VITAMIN D3) 25 MCG (1000 UT) CAPS Take 1 capsule by mouth See admin instructions. Take before Dialysis on Monday Wednesday and Fridays     LANTUS 100 UNIT/ML injection SMARTSIG:10 Unit(s) SUB-Q Daily     oxyCODONE (OXY IR/ROXICODONE) 5 MG immediate release tablet Take 1 tablet (5 mg total) by mouth every 6 (six) hours as needed for severe pain. 20 tablet 0   pantoprazole (PROTONIX) 40 MG tablet Take 40 mg by mouth daily.     tiZANidine (ZANAFLEX) 4 MG tablet Take 4 mg by mouth daily as needed.  traMADol (ULTRAM) 50 MG tablet Take 1 tablet (50 mg total) by mouth every 12 (twelve) hours as needed. 15 tablet 0   carvedilol (COREG) 3.125 MG tablet Take 1 tablet (3.125 mg total) by mouth 2 (two) times daily with a meal. (Patient not taking: Reported on 12/07/2020) 180 tablet 3   No current facility-administered medications for this visit.     ROS:  See HPI  Physical Exam:  Vitals:   03/31/21 1026  BP: 108/65  Pulse: 73  Resp: 20  Temp: (!) 97.3 F (36.3 C)  TempSrc: Temporal  SpO2: 95%   General: well appearing, well nourished Cardiac:regular Lungs: non labored Skin: Patient has excoriation all over his body in various stages of healing Incision:  Left BKA as shown below. Very slow to heal. Eschar  along staple line. No active drainage. At high risk for dehiscence.  I did not probe incision. Took out 8 staples that were essentially falling out. Erythema along distal stump. 4x4s, kerlix and ACE applied   Right BKA as below. Large medial ulceration present. Distal stump is ice cold. Serous drainage present. Hydrogel, 4x4's, Kerlix and ACE applied.     Neuro: alert and oriented Abdomen:  obese  Assessment/Plan:  This is a 51 y.o. male who is s/p: left BKA on 02/16/21 by Dr. Virl Cagey. Left BKA remains essentially unchanged from his prior visit 2 weeks ago. Some improvement in erythema. He is in middle of his keflex course. I have discussed importance of completing this with patient. I removed several of the staples today as they were essentially falling out anyways. I discussed with him washing both stumps daily with soap and water. He remains at high risk for the left BKA dehiscing. The right BKA also is very high risk for needing more proximal amputation with ischemic changes. I will have him return in 1 week for removal of remaining staples and wound follow up. The only availability in our office schedule is with Dr. Virl Cagey on 04/09/21.    Karoline Caldwell, PA-C [facility] [phone]  Clinic MD:  Dr. Donzetta Matters

## 2021-04-09 ENCOUNTER — Ambulatory Visit: Payer: Medicaid Other | Admitting: Vascular Surgery

## 2021-04-09 ENCOUNTER — Emergency Department (HOSPITAL_COMMUNITY): Payer: Medicaid Other

## 2021-04-09 ENCOUNTER — Encounter (HOSPITAL_COMMUNITY): Payer: Self-pay

## 2021-04-09 ENCOUNTER — Other Ambulatory Visit: Payer: Self-pay

## 2021-04-09 ENCOUNTER — Emergency Department (HOSPITAL_COMMUNITY)
Admission: EM | Admit: 2021-04-09 | Discharge: 2021-04-10 | Disposition: A | Discharge status: Home / Self Care | Payer: Medicaid Other | Attending: Emergency Medicine | Admitting: Emergency Medicine

## 2021-04-09 DIAGNOSIS — N186 End stage renal disease: Secondary | ICD-10-CM | POA: Diagnosis not present

## 2021-04-09 DIAGNOSIS — Z79899 Other long term (current) drug therapy: Secondary | ICD-10-CM | POA: Insufficient documentation

## 2021-04-09 DIAGNOSIS — Z992 Dependence on renal dialysis: Secondary | ICD-10-CM | POA: Insufficient documentation

## 2021-04-09 DIAGNOSIS — T8131XA Disruption of external operation (surgical) wound, not elsewhere classified, initial encounter: Secondary | ICD-10-CM | POA: Diagnosis not present

## 2021-04-09 DIAGNOSIS — Z7901 Long term (current) use of anticoagulants: Secondary | ICD-10-CM | POA: Diagnosis not present

## 2021-04-09 DIAGNOSIS — Z7982 Long term (current) use of aspirin: Secondary | ICD-10-CM | POA: Diagnosis not present

## 2021-04-09 DIAGNOSIS — T8130XA Disruption of wound, unspecified, initial encounter: Secondary | ICD-10-CM

## 2021-04-09 LAB — BASIC METABOLIC PANEL
Anion gap: 15 (ref 5–15)
BUN: 37 mg/dL — ABNORMAL HIGH (ref 6–20)
CO2: 21 mmol/L — ABNORMAL LOW (ref 22–32)
Calcium: 8.3 mg/dL — ABNORMAL LOW (ref 8.9–10.3)
Chloride: 99 mmol/L (ref 98–111)
Creatinine, Ser: 5.88 mg/dL — ABNORMAL HIGH (ref 0.61–1.24)
GFR, Estimated: 11 mL/min — ABNORMAL LOW (ref >60)
Glucose, Bld: 98 mg/dL (ref 70–99)
Potassium: 4.5 mmol/L (ref 3.5–5.1)
Sodium: 135 mmol/L (ref 135–145)

## 2021-04-09 LAB — RENAL FUNCTION PANEL
Albumin: 2.3 g/dL — ABNORMAL LOW (ref 3.5–5.0)
Anion gap: 9 (ref 5–15)
BUN: 38 mg/dL — ABNORMAL HIGH (ref 6–20)
CO2: 25 mmol/L (ref 22–32)
Calcium: 8.2 mg/dL — ABNORMAL LOW (ref 8.9–10.3)
Chloride: 100 mmol/L (ref 98–111)
Creatinine, Ser: 5.76 mg/dL — ABNORMAL HIGH (ref 0.61–1.24)
GFR, Estimated: 11 mL/min — ABNORMAL LOW (ref >60)
Glucose, Bld: 72 mg/dL (ref 70–99)
Phosphorus: 6.8 mg/dL — ABNORMAL HIGH (ref 2.5–4.6)
Potassium: 4.1 mmol/L (ref 3.5–5.1)
Sodium: 134 mmol/L — ABNORMAL LOW (ref 135–145)

## 2021-04-09 LAB — CBC
HCT: 33.2 % — ABNORMAL LOW (ref 39.0–52.0)
HCT: 30 % — ABNORMAL LOW (ref 39.0–52.0)
Hemoglobin: 10.7 g/dL — ABNORMAL LOW (ref 13.0–17.0)
Hemoglobin: 9.5 g/dL — ABNORMAL LOW (ref 13.0–17.0)
MCH: 34.6 pg — ABNORMAL HIGH (ref 26.0–34.0)
MCH: 33.3 pg (ref 26.0–34.0)
MCHC: 32.2 g/dL (ref 30.0–36.0)
MCHC: 31.7 g/dL (ref 30.0–36.0)
MCV: 107.4 fL — ABNORMAL HIGH (ref 80.0–100.0)
MCV: 105.3 fL — ABNORMAL HIGH (ref 80.0–100.0)
Platelets: 145 10*3/uL — ABNORMAL LOW (ref 150–400)
Platelets: 159 10*3/uL (ref 150–400)
RBC: 3.09 MIL/uL — ABNORMAL LOW (ref 4.22–5.81)
RBC: 2.85 MIL/uL — ABNORMAL LOW (ref 4.22–5.81)
RDW: 18.1 % — ABNORMAL HIGH (ref 11.5–15.5)
RDW: 17.9 % — ABNORMAL HIGH (ref 11.5–15.5)
WBC: 7.7 10*3/uL (ref 4.0–10.5)
WBC: 8.6 10*3/uL (ref 4.0–10.5)
nRBC: 0.5 % — ABNORMAL HIGH (ref 0.0–0.2)
nRBC: 0.4 % — ABNORMAL HIGH (ref 0.0–0.2)

## 2021-04-09 IMAGING — DX DG CHEST 1V PORT
1 series · 1 of 1 positions shown · non-contrast
Comparison: [DATE]

CLINICAL DATA: Shortness of breath

EXAM:
PORTABLE CHEST 1 VIEW

[chest ap]
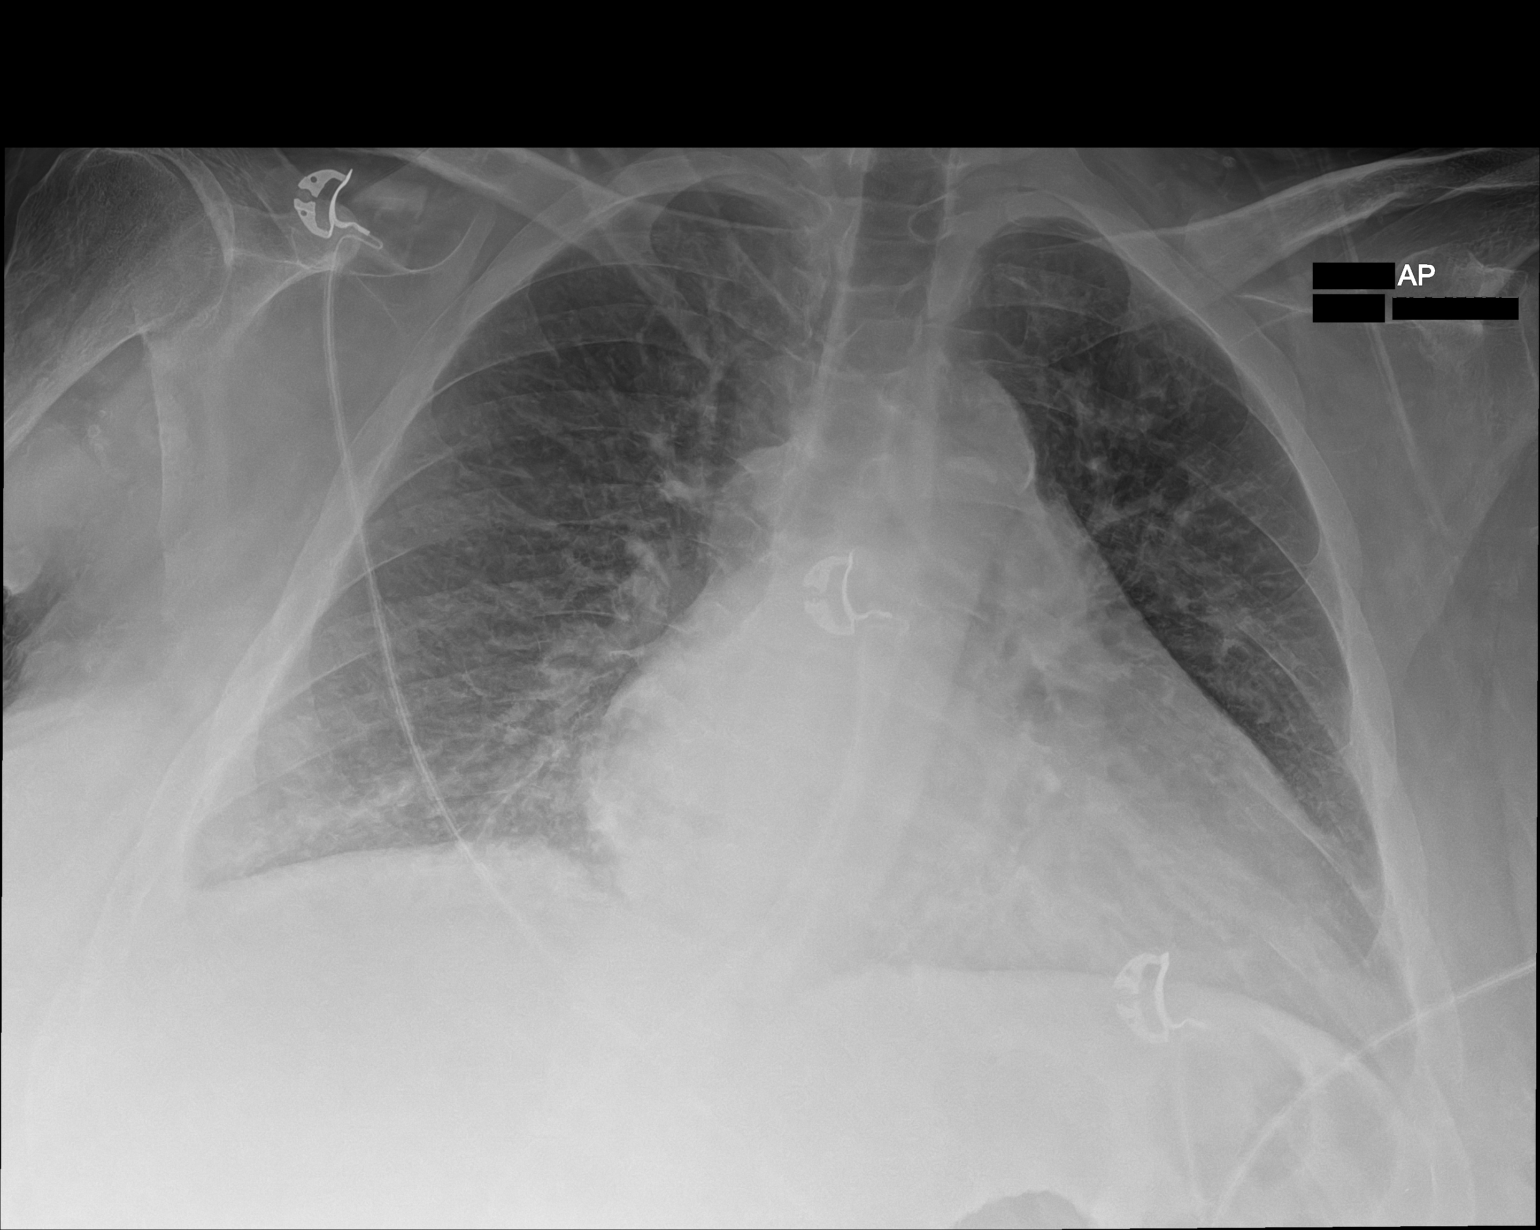

[1 of 1 positions shown; findings below may reference images not displayed]

FINDINGS: Similar cardiomegaly. Interstitial changes are similar to slightly
increased. Probable trace right pleural effusion. No pneumothorax.
IMPRESSION: Probable interstitial pulmonary edema. Similar cardiomegaly.
Possible trace right pleural effusion.

## 2021-04-09 MED ORDER — TRAMADOL HCL 50 MG PO TABS
50.0000 mg | ORAL_TABLET | Freq: Once | ORAL | Status: AC
  Administered 2021-04-09: 50 mg via ORAL
  Filled 2021-04-09 (×2): qty 1

## 2021-04-09 MED ORDER — LIDOCAINE-PRILOCAINE 2.5-2.5 % EX CREA: 1.0000 "application " | TOPICAL_CREAM | CUTANEOUS | Status: DC | PRN

## 2021-04-09 MED ORDER — ALTEPLASE 2 MG IJ SOLR
2.0000 mg | Freq: Once | INTRAMUSCULAR | Status: DC | PRN
  Filled 2021-04-09: qty 2

## 2021-04-09 MED ORDER — LIDOCAINE HCL (PF) 1 % IJ SOLN: 5.0000 mL | INTRAMUSCULAR | Status: DC | PRN

## 2021-04-09 MED ORDER — HEPARIN SODIUM (PORCINE) 1000 UNIT/ML DIALYSIS: 1000.0000 [IU] | INTRAMUSCULAR | Status: DC | PRN

## 2021-04-09 MED ORDER — PENTAFLUOROPROP-TETRAFLUOROETH EX AERO: 1.0000 "application " | INHALATION_SPRAY | CUTANEOUS | Status: DC | PRN

## 2021-04-09 MED ORDER — SODIUM CHLORIDE 0.9 % IV SOLN: 100.0000 mL | INTRAVENOUS | Status: DC | PRN

## 2021-04-09 MED ORDER — CHLORHEXIDINE GLUCONATE CLOTH 2 % EX PADS: 6.0000 | MEDICATED_PAD | Freq: Every day | CUTANEOUS | Status: DC

## 2021-04-09 NOTE — Procedures (Signed)
? ?  HEMODIALYSIS TREATMENT NOTE: ? ? ?[facility] is unable to accommodate pt until he acquires a Pharmacist, hospital for transfers.  Pt reports a pad was ordered by Ramiro Harvest, PA-C at [facility] on Friday 04/02/21.  Pt called [facility] on Monday 2/27 to follow-up and was told Medicaid approval for the pad was pending.   ? ?[facility] staff assisted with transfer Monday of this week.  Pt missed Wednesday (as is his custom) and was sent to [facility] ED today for dialysis.  Pt tells me he has no idea when he will have the pad and that he plans to come to [facility] for dialysis until he has the pad.  "Ain't no sense going to [facility] on Monday.  I'll be right back up this way." ? ?3 hour heparin-free HD session completed using left upper arm AVG (16g/antegrade). Pt asked to terminate session 30 minutes early / AMA.  All blood was returned and hemostasis was achieved in 15 minutes.  Net UF 1.4L ? ? ? ?Rockwell Alexandria, RN ?

## 2021-04-09 NOTE — ED Notes (Signed)
Lab at bedside

## 2021-04-09 NOTE — Discharge Instructions (Addendum)
Follow-up with vascular surgery.  We have given you a left about which could help to lift you at the dialysis center.  Continue to clean the wounds of your legs with soap and water every day. ?

## 2021-04-09 NOTE — ED Notes (Signed)
Dressing applied to left lower extremity. No further bleeding or drainage from area ?

## 2021-04-09 NOTE — ED Triage Notes (Signed)
Pt to the ED via [facility] from [facility] center with complaints of Left leg bleeding to amputation. ? ?Pt was trying to transfer this morning and the staple caught on something and it started bleeding. ? ?Pt went to dialysis and they refused him due to not being able to lift his weight. ? ?Pt is a bilateral amputee and has not had Dialysis since Monday. ? ?

## 2021-04-09 NOTE — ED Provider Notes (Signed)
3:55 PM-checkout from Dr. Alvino Chapel to evaluate patient after return from dialysis.  He presented earlier today from the dialysis center was unable to provide dialysis because there was no Hoyer lift belt available.  He is a bilateral amputee.  Secondary problem today was dehiscence of wound from amputation procedure. ? ?10:45 PM -patient is comfortable now and wants to go home.  Nursing was able to find about that can possibly work on the [employer].  Apparently nurses have found bugs around the patient so they have shut his door and put a towel beneath the threshold.  Patient does not like this and wants the door open.  I explained that we could not address the wound further on his left leg.  He was due to see his vascular surgeon today.  His BKA was on 02/16/2021, and the wound has been healing slowly since then.  Therefore his staples have been kept in. ? ?Patient has been dialyzed, and is stable for discharge. ?  ?Daleen Bo, MD ?04/09/21 2247 ? ?

## 2021-04-09 NOTE — ED Provider Notes (Signed)
?[facility] ?Provider Note ? ? ?CSN: [id number] ?Arrival date & time: 04/09/21  1237 ? ?  ? ?History ? ?Chief Complaint  ?Patient presents with  ? Leg Injury  ? ? ?Brendan Campbell is a 51 y.o. male. ? ?HPI ?Patient sent from dialysis.  Left leg wound.  Has chronic wound on left leg from previous amputation.  Staples still in place but reportedly caught one of them.  Has had some bleeding earlier today.  Reviewing vascular note he is high risk for dehiscence.  Slight dehiscence at site but not actively bleeding.  However patient has not been able to get dialysis.  States that he is supposed to have a Hoyer belt to be able to lift him and does not have one.  States he was dialyzed Monday but was not dialyzed Wednesday because of it, today is Friday and he also was not dialyzed today.  States the doctor reportedly sent him to the ER to be dialyzed here.  Patient states he thinks he can get the belt by Monday.  Patient states that the insurance at the dialysis center would not let them lift him to transfer him. ?  ? ?Home Medications ?Prior to Admission medications   ?Medication Sig Start Date End Date Taking? Authorizing Provider  ?acetaminophen (TYLENOL) 325 MG tablet Take 2 tablets (650 mg total) by mouth every 6 (six) hours as needed for mild pain, moderate pain or fever. 02/06/21   Cherene Altes, MD  ?albuterol (VENTOLIN HFA) 108 (90 Base) MCG/ACT inhaler Inhale 2 puffs into the lungs every 4 (four) hours as needed for wheezing or shortness of breath. 07/21/20   Manuella Ghazi, Pratik D, DO  ?amiodarone (PACERONE) 200 MG tablet Take 200 mg by mouth daily. 03/29/21   [provider]  ?apixaban (ELIQUIS) 5 MG TABS tablet Take 1 tablet (5 mg total) by mouth 2 (two) times daily. 08/23/19   Strader, Fransisco Hertz, PA-C  ?aspirin 81 MG chewable tablet Chew 81 mg by mouth daily.    [provider]  ?atorvastatin (LIPITOR) 10 MG tablet Take 1 tablet (10 mg total) by mouth every evening. ?Patient  taking differently: Take 10 mg by mouth every evening. Take 1 tablet on Monday, Wednesday and Friday. 08/03/19   Barton Dubois, MD  ?butalbital-acetaminophen-caffeine (FIORICET) [phone] MG tablet Take 1 tablet by mouth every 6 (six) hours as needed for headache or migraine. 07/21/20   Manuella Ghazi, Pratik D, DO  ?calcium acetate (PHOSLO) 667 MG capsule Take 1 capsule (667 mg total) by mouth 3 (three) times daily with meals. 02/21/19   Donnamae Jude, MD  ?calcium carbonate (TUMS - DOSED IN MG ELEMENTAL CALCIUM) 500 MG chewable tablet Chew 4 tablets (800 mg of elemental calcium total) by mouth 2 (two) times daily. 08/03/19   Barton Dubois, MD  ?carvedilol (COREG) 3.125 MG tablet Take 1 tablet (3.125 mg total) by mouth 2 (two) times daily with a meal. ?Patient not taking: Reported on 12/07/2020 04/09/20 11/26/20  Deatra Eligah, MD  ?cephALEXin (KEFLEX) 500 MG capsule Take 1 capsule (500 mg total) by mouth 2 (two) times daily. 03/18/21   Gabriel Earing, PA-C  ?Cholecalciferol (VITAMIN D3) 25 MCG (1000 UT) CAPS Take 1 capsule by mouth See admin instructions. Take before Dialysis on Monday Wednesday and Fridays    [provider]  ?LANTUS 100 UNIT/ML injection SMARTSIG:10 Unit(s) SUB-Q Daily 03/23/21   [provider]  ?oxyCODONE (OXY IR/ROXICODONE) 5 MG immediate release tablet Take 1  tablet (5 mg total) by mouth every 6 (six) hours as needed for severe pain. 02/19/21   Rhyne, Hulen Shouts, PA-C  ?pantoprazole (PROTONIX) 40 MG tablet Take 40 mg by mouth daily. 03/23/21   [provider]  ?tiZANidine (ZANAFLEX) 4 MG tablet Take 4 mg by mouth daily as needed. 03/23/21   [provider]  ?traMADol (ULTRAM) 50 MG tablet Take 1 tablet (50 mg total) by mouth every 12 (twelve) hours as needed. 9/82/64   Delora Fuel, MD  ?   ? ?Allergies    ?Tape and Chlorhexidine gluconate [chlorhexidine]   ? ?Review of Systems   ?Review of Systems  ?Constitutional:  Negative for appetite change.  ?Respiratory:   Positive for shortness of breath. Negative for chest tightness.   ?Skin:  Positive for wound.  ?Psychiatric/Behavioral:  Negative for confusion.   ? ?Physical Exam ?Updated Vital Signs ?BP 93/62   Pulse 77   Temp 97.6 ?F (36.4 ?C) (Oral)   Resp 17   SpO2 95%  ?Physical Exam ?Musculoskeletal:  ?   Comments: Some edema on bilateral upper extremities.  Previous amputation right lower extremity, left lower extremity previous below the knee amputations with some staples still intact.  No active bleeding.  Some mild dehiscence on the middle of the wound.  Slight erythema.  ?Skin: ?   General: Skin is warm.  ?Neurological:  ?   Mental Status: He is alert.  ? ? ?ED Results / Procedures / Treatments   ?Labs ?(all labs ordered are listed, but only abnormal results are displayed) ?Labs Reviewed  ?BASIC METABOLIC PANEL - Abnormal; Notable for the following components:  ?    Result Value  ? CO2 21 (*)   ? BUN 37 (*)   ? Creatinine, Ser 5.88 (*)   ? Calcium 8.3 (*)   ? GFR, Estimated 11 (*)   ? All other components within normal limits  ?CBC - Abnormal; Notable for the following components:  ? RBC 3.09 (*)   ? Hemoglobin 10.7 (*)   ? HCT 33.2 (*)   ? MCV 107.4 (*)   ? MCH 34.6 (*)   ? RDW 18.1 (*)   ? Platelets 145 (*)   ? nRBC 0.5 (*)   ? All other components within normal limits  ? ? ?EKG ?EKG Interpretation ? ?Date/Time:  Friday April 09 2021 13:28:37 EST ?Ventricular Rate:  79 ?PR Interval:  239 ?QRS Duration: 163 ?QT Interval:  461 ?QTC Calculation: 529 ?R Axis:   -27 ?Text Interpretation: Sinus rhythm Prolonged PR interval IVCD, consider atypical LBBB Confirmed by Davonna Belling [id number]) on 04/09/2021 1:53:47 PM ? ?Radiology ?DG Chest Portable 1 View ? ?Result Date: 04/09/2021 ?CLINICAL DATA:  Shortness of breath EXAM: PORTABLE CHEST 1 VIEW COMPARISON:  03/10/2021 FINDINGS: Similar cardiomegaly. Interstitial changes are similar to slightly increased. Probable trace right pleural effusion. No pneumothorax. IMPRESSION:  Probable interstitial pulmonary edema. Similar cardiomegaly. Possible trace right pleural effusion. Electronically Signed   By: Macy Mis M.D.   On: 04/09/2021 13:43   ? ?Procedures ?Procedures  ? ? ?Medications Ordered in ED ?Medications  ?Chlorhexidine Gluconate Cloth 2 % PADS 6 each (has no administration in time range)  ? ? ?ED Course/ Medical Decision Making/ A&P ?  ?                        ?Medical Decision Making ?Amount and/or Complexity of Data Reviewed ?External Data Reviewed: notes. ?   Details:  Vascular notes ?Labs: ordered. Decision-making details documented in ED Course. ?Radiology: ordered. ?Discussion of management or test interpretation with external provider(s): Dr. Hollie Salk from nephrology. ? ? ?Patient presents with wound of leg.  Does not appear to need acute intervention at this time.  Will rewrap, however has not had dialysis since Monday with today being Friday.X-ray showed some interstitial edema.  Independently interpreted by me.  Lab work still pending but will discuss with nephrology about more urgent dialysis since would not be able to get done until Monday at the earliest.  EKG interpreted and stable to prior. ? ?Discussed with Dr. Hollie Salk from nephrology.  Will arrange for dialysis.  Likely will be able to discharge after.  Wound dehiscence does not need further treatment at this time.  Just wound care and can be followed by vascular surgery.  Likely dialysis and go home. ?Care turned over to Dr. Eulis Foster. ? ? ? ? ? ? ? ?Final Clinical Impression(s) / ED Diagnoses ?Final diagnoses:  ?End stage renal disease on dialysis [facility])  ?Wound dehiscence  ? ? ?Rx / DC Orders ?ED Discharge Orders   ? ? None  ? ?  ? ? ?  ?Davonna Belling, MD ?04/09/21 1511 ? ?

## 2021-04-10 LAB — HEPATITIS B SURFACE ANTIBODY,QUALITATIVE: Hep B S Ab: REACTIVE — AB

## 2021-04-10 LAB — HEPATITIS B SURFACE ANTIGEN: Hepatitis B Surface Ag: NONREACTIVE

## 2021-04-10 NOTE — ED Notes (Signed)
Pt refused to sign d/c. Left with [facility] to home ?

## 2021-04-10 NOTE — ED Notes (Signed)
Pt pooped and got it all over himmself. This RN and 2 NT cleaned pt up, new brief, and gown. Pt now resting in bed ?

## 2021-04-12 LAB — HEPATITIS B SURFACE ANTIBODY, QUANTITATIVE: Hep B S AB Quant (Post): 62.7 m[IU]/mL (ref >9.9)

## 2021-04-16 ENCOUNTER — Telehealth: Payer: Self-pay

## 2021-04-16 NOTE — Telephone Encounter (Signed)
Brendan Campbell called to make appt for pt who is currently at [facility] in [place]. Pt bumped his stump site and has purulent drainage coming from the open areas. I have made him an appt for next week and spoke to HD nurse to give her date/time. She said pt won't keep dressings covering the area and he is currently picking at it with his fingernails. Pt will hopefully be able to find transportation for this appt.  ?

## 2021-04-19 NOTE — Progress Notes (Unsigned)
POST OPERATIVE OFFICE NOTE    CC:  F/u for surgery  HPI:  This is a 51 y.o. 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 Virl Cagey.       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    Dialysis access hx: -BVT - left 2019 -LUA AVG 2019 by Dr. Donzetta Matters -venoplasty 2020  Pt was seen on 03/18/2021 and at that time, his incision was slow to heal.  He was started on abx and scheduled for follow up in 2 weeks.   He was last seen on 03/31/2021 and at that time. He was having some pain in both legs.  He was worried about the left BKA falling apart.  He was cleaning both stumps with soap and water.  He was compliant with his abx. He was not having fever or chills.  Several staples were removed and he was instructed to return in one week to have the remaining staples removed.  He did miss that appt bc he was in the ED.  There was concern about dehiscence at that time.  PA from HD called our office on 3/10 and there was concern that the pt had bumped his stump and had purulent drainage coming from the open areas.  She was told that he would not keep a bandage on the stump and was picking at it.  An appt was made for follow up.   Pt returns today for that visit.  Pt states ***  Allergies  Allergen Reactions   Tape Other (See Comments)    Pulls skin off   Chlorhexidine Gluconate [Chlorhexidine] Rash    Current Outpatient Medications  Medication Sig Dispense Refill   acetaminophen (TYLENOL) 325 MG tablet Take 2 tablets (650 mg total) by mouth every 6 (six) hours as needed for mild pain, moderate pain or fever.     albuterol (VENTOLIN HFA) 108 (90 Base) MCG/ACT inhaler Inhale 2 puffs into the lungs every 4 (four) hours as needed for wheezing or shortness of breath. 8 g 1   amiodarone (PACERONE) 200 MG tablet Take 200 mg by mouth daily.     apixaban (ELIQUIS) 5 MG TABS tablet Take 1 tablet (5 mg total) by mouth 2 (two) times daily. 180 tablet 3   aspirin 81 MG  chewable tablet Chew 81 mg by mouth daily.     atorvastatin (LIPITOR) 10 MG tablet Take 1 tablet (10 mg total) by mouth every evening. (Patient taking differently: Take 10 mg by mouth every evening. Take 1 tablet on Monday, Wednesday and Friday.) 90 tablet 2   butalbital-acetaminophen-caffeine (FIORICET) 50-325-40 MG tablet Take 1 tablet by mouth every 6 (six) hours as needed for headache or migraine. 14 tablet 0   calcium acetate (PHOSLO) 667 MG capsule Take 1 capsule (667 mg total) by mouth 3 (three) times daily with meals. 90 capsule 2   calcium carbonate (TUMS - DOSED IN MG ELEMENTAL CALCIUM) 500 MG chewable tablet Chew 4 tablets (800 mg of elemental calcium total) by mouth 2 (two) times daily. 60 tablet 2   carvedilol (COREG) 3.125 MG tablet Take 1 tablet (3.125 mg total) by mouth 2 (two) times daily with a meal. (Patient not taking: Reported on 12/07/2020) 180 tablet 3   cephALEXin (KEFLEX) 500 MG capsule Take 1 capsule (500 mg total) by mouth 2 (two) times daily. (Patient not taking: Reported on 04/09/2021) 20 capsule 0   Cholecalciferol (VITAMIN D3) 25 MCG (  1000 UT) CAPS Take 1 capsule by mouth See admin instructions. Take before Dialysis on Monday Wednesday and Fridays     LANTUS 100 UNIT/ML injection Inject 5-10 Units into the skin every evening.     oxyCODONE (OXY IR/ROXICODONE) 5 MG immediate release tablet Take 1 tablet (5 mg total) by mouth every 6 (six) hours as needed for severe pain. (Patient not taking: Reported on 04/09/2021) 20 tablet 0   pantoprazole (PROTONIX) 40 MG tablet Take 40 mg by mouth daily.     tiZANidine (ZANAFLEX) 4 MG tablet Take 4 mg by mouth daily as needed.     traMADol (ULTRAM) 50 MG tablet Take 1 tablet (50 mg total) by mouth every 12 (twelve) hours as needed. 15 tablet 0   No current facility-administered medications for this visit.     ROS:  See HPI  Physical Exam:  ***  Incision:  *** Extremities:  *** Neuro: *** Abdomen:  ***   Assessment/Plan:   This is a 51 y.o. male who is s/p: left BKA on 02/16/2021 by Dr. Virl Cagey now with concerns of infection/dehiscence   -***   Leontine Locket, [facility] [phone]   Clinic MD:  Carlis Abbott

## 2021-04-20 ENCOUNTER — Emergency Department (HOSPITAL_COMMUNITY): Payer: Medicaid Other

## 2021-04-20 ENCOUNTER — Inpatient Hospital Stay (HOSPITAL_COMMUNITY)
Admission: EM | Admit: 2021-04-20 | Discharge: 2021-05-01 | DRG: 463 | Disposition: A | Discharge status: Skilled Nursing Facility | Payer: Medicaid Other | Attending: Internal Medicine | Admitting: Internal Medicine

## 2021-04-20 ENCOUNTER — Other Ambulatory Visit: Payer: Self-pay

## 2021-04-20 ENCOUNTER — Ambulatory Visit (INDEPENDENT_AMBULATORY_CARE_PROVIDER_SITE_OTHER): Payer: Medicaid Other | Admitting: Physician Assistant

## 2021-04-20 ENCOUNTER — Encounter: Payer: Self-pay | Admitting: Physician Assistant

## 2021-04-20 VITALS — BP 115/83 | HR 72 | Temp 97.8°F | Resp 18 | Wt 205.0 lb

## 2021-04-20 DIAGNOSIS — J449 Chronic obstructive pulmonary disease, unspecified: Secondary | ICD-10-CM | POA: Diagnosis present

## 2021-04-20 DIAGNOSIS — Y835 Amputation of limb(s) as the cause of abnormal reaction of the patient, or of later complication, without mention of misadventure at the time of the procedure: Secondary | ICD-10-CM | POA: Diagnosis present

## 2021-04-20 DIAGNOSIS — Z6841 Body Mass Index (BMI) 40.0 and over, adult: Secondary | ICD-10-CM | POA: Diagnosis not present

## 2021-04-20 DIAGNOSIS — E785 Hyperlipidemia, unspecified: Secondary | ICD-10-CM | POA: Diagnosis present

## 2021-04-20 DIAGNOSIS — I9581 Postprocedural hypotension: Secondary | ICD-10-CM | POA: Diagnosis not present

## 2021-04-20 DIAGNOSIS — J9611 Chronic respiratory failure with hypoxia: Secondary | ICD-10-CM | POA: Diagnosis present

## 2021-04-20 DIAGNOSIS — Y92239 Unspecified place in hospital as the place of occurrence of the external cause: Secondary | ICD-10-CM | POA: Diagnosis not present

## 2021-04-20 DIAGNOSIS — Z794 Long term (current) use of insulin: Secondary | ICD-10-CM

## 2021-04-20 DIAGNOSIS — I10 Essential (primary) hypertension: Secondary | ICD-10-CM | POA: Diagnosis present

## 2021-04-20 DIAGNOSIS — T8740 Infection of amputation stump, unspecified extremity: Secondary | ICD-10-CM | POA: Diagnosis not present

## 2021-04-20 DIAGNOSIS — F4310 Post-traumatic stress disorder, unspecified: Secondary | ICD-10-CM | POA: Diagnosis present

## 2021-04-20 DIAGNOSIS — M86252 Subacute osteomyelitis, left femur: Secondary | ICD-10-CM

## 2021-04-20 DIAGNOSIS — Y838 Other surgical procedures as the cause of abnormal reaction of the patient, or of later complication, without mention of misadventure at the time of the procedure: Secondary | ICD-10-CM | POA: Diagnosis present

## 2021-04-20 DIAGNOSIS — Z888 Allergy status to other drugs, medicaments and biological substances status: Secondary | ICD-10-CM

## 2021-04-20 DIAGNOSIS — I4891 Unspecified atrial fibrillation: Secondary | ICD-10-CM | POA: Diagnosis not present

## 2021-04-20 DIAGNOSIS — I5082 Biventricular heart failure: Secondary | ICD-10-CM | POA: Diagnosis present

## 2021-04-20 DIAGNOSIS — T4275XA Adverse effect of unspecified antiepileptic and sedative-hypnotic drugs, initial encounter: Secondary | ICD-10-CM | POA: Diagnosis not present

## 2021-04-20 DIAGNOSIS — Z992 Dependence on renal dialysis: Secondary | ICD-10-CM

## 2021-04-20 DIAGNOSIS — E1122 Type 2 diabetes mellitus with diabetic chronic kidney disease: Secondary | ICD-10-CM | POA: Diagnosis present

## 2021-04-20 DIAGNOSIS — E1142 Type 2 diabetes mellitus with diabetic polyneuropathy: Secondary | ICD-10-CM | POA: Diagnosis present

## 2021-04-20 DIAGNOSIS — T8140XA Infection following a procedure, unspecified, initial encounter: Principal | ICD-10-CM

## 2021-04-20 DIAGNOSIS — I70262 Atherosclerosis of native arteries of extremities with gangrene, left leg: Secondary | ICD-10-CM | POA: Diagnosis present

## 2021-04-20 DIAGNOSIS — D696 Thrombocytopenia, unspecified: Secondary | ICD-10-CM | POA: Diagnosis present

## 2021-04-20 DIAGNOSIS — E875 Hyperkalemia: Secondary | ICD-10-CM | POA: Diagnosis present

## 2021-04-20 DIAGNOSIS — N186 End stage renal disease: Secondary | ICD-10-CM | POA: Diagnosis present

## 2021-04-20 DIAGNOSIS — X58XXXA Exposure to other specified factors, initial encounter: Secondary | ICD-10-CM | POA: Diagnosis not present

## 2021-04-20 DIAGNOSIS — Z89512 Acquired absence of left leg below knee: Secondary | ICD-10-CM

## 2021-04-20 DIAGNOSIS — I952 Hypotension due to drugs: Secondary | ICD-10-CM | POA: Diagnosis not present

## 2021-04-20 DIAGNOSIS — J441 Chronic obstructive pulmonary disease with (acute) exacerbation: Secondary | ICD-10-CM | POA: Diagnosis present

## 2021-04-20 DIAGNOSIS — I440 Atrioventricular block, first degree: Secondary | ICD-10-CM | POA: Diagnosis not present

## 2021-04-20 DIAGNOSIS — E119 Type 2 diabetes mellitus without complications: Secondary | ICD-10-CM

## 2021-04-20 DIAGNOSIS — E11649 Type 2 diabetes mellitus with hypoglycemia without coma: Secondary | ICD-10-CM | POA: Diagnosis not present

## 2021-04-20 DIAGNOSIS — Z20822 Contact with and (suspected) exposure to covid-19: Secondary | ICD-10-CM | POA: Diagnosis present

## 2021-04-20 DIAGNOSIS — Z833 Family history of diabetes mellitus: Secondary | ICD-10-CM

## 2021-04-20 DIAGNOSIS — I953 Hypotension of hemodialysis: Secondary | ICD-10-CM | POA: Diagnosis not present

## 2021-04-20 DIAGNOSIS — D631 Anemia in chronic kidney disease: Secondary | ICD-10-CM | POA: Diagnosis present

## 2021-04-20 DIAGNOSIS — L97828 Non-pressure chronic ulcer of other part of left lower leg with other specified severity: Secondary | ICD-10-CM | POA: Diagnosis present

## 2021-04-20 DIAGNOSIS — T4145XA Adverse effect of unspecified anesthetic, initial encounter: Secondary | ICD-10-CM | POA: Diagnosis not present

## 2021-04-20 DIAGNOSIS — S5011XA Contusion of right forearm, initial encounter: Secondary | ICD-10-CM | POA: Diagnosis not present

## 2021-04-20 DIAGNOSIS — I132 Hypertensive heart and chronic kidney disease with heart failure and with stage 5 chronic kidney disease, or end stage renal disease: Secondary | ICD-10-CM | POA: Diagnosis present

## 2021-04-20 DIAGNOSIS — E1151 Type 2 diabetes mellitus with diabetic peripheral angiopathy without gangrene: Secondary | ICD-10-CM | POA: Diagnosis not present

## 2021-04-20 DIAGNOSIS — M86172 Other acute osteomyelitis, left ankle and foot: Secondary | ICD-10-CM | POA: Diagnosis present

## 2021-04-20 DIAGNOSIS — E1152 Type 2 diabetes mellitus with diabetic peripheral angiopathy with gangrene: Secondary | ICD-10-CM | POA: Diagnosis present

## 2021-04-20 DIAGNOSIS — M898X9 Other specified disorders of bone, unspecified site: Secondary | ICD-10-CM | POA: Diagnosis present

## 2021-04-20 DIAGNOSIS — Z91048 Other nonmedicinal substance allergy status: Secondary | ICD-10-CM

## 2021-04-20 DIAGNOSIS — Z8249 Family history of ischemic heart disease and other diseases of the circulatory system: Secondary | ICD-10-CM

## 2021-04-20 DIAGNOSIS — I480 Paroxysmal atrial fibrillation: Secondary | ICD-10-CM | POA: Diagnosis not present

## 2021-04-20 DIAGNOSIS — E440 Moderate protein-calorie malnutrition: Secondary | ICD-10-CM | POA: Diagnosis present

## 2021-04-20 DIAGNOSIS — B888 Other specified infestations: Secondary | ICD-10-CM | POA: Diagnosis present

## 2021-04-20 DIAGNOSIS — D649 Anemia, unspecified: Secondary | ICD-10-CM | POA: Diagnosis not present

## 2021-04-20 DIAGNOSIS — E669 Obesity, unspecified: Secondary | ICD-10-CM | POA: Diagnosis present

## 2021-04-20 DIAGNOSIS — E871 Hypo-osmolality and hyponatremia: Secondary | ICD-10-CM | POA: Diagnosis not present

## 2021-04-20 DIAGNOSIS — Z79899 Other long term (current) drug therapy: Secondary | ICD-10-CM

## 2021-04-20 DIAGNOSIS — E1169 Type 2 diabetes mellitus with other specified complication: Secondary | ICD-10-CM | POA: Diagnosis present

## 2021-04-20 DIAGNOSIS — I482 Chronic atrial fibrillation, unspecified: Secondary | ICD-10-CM | POA: Diagnosis present

## 2021-04-20 DIAGNOSIS — I509 Heart failure, unspecified: Secondary | ICD-10-CM

## 2021-04-20 DIAGNOSIS — Z87891 Personal history of nicotine dependence: Secondary | ICD-10-CM

## 2021-04-20 DIAGNOSIS — I739 Peripheral vascular disease, unspecified: Secondary | ICD-10-CM | POA: Diagnosis present

## 2021-04-20 DIAGNOSIS — Z7901 Long term (current) use of anticoagulants: Secondary | ICD-10-CM

## 2021-04-20 DIAGNOSIS — Z89511 Acquired absence of right leg below knee: Secondary | ICD-10-CM

## 2021-04-20 DIAGNOSIS — N5089 Other specified disorders of the male genital organs: Secondary | ICD-10-CM | POA: Diagnosis present

## 2021-04-20 DIAGNOSIS — T8789 Other complications of amputation stump: Secondary | ICD-10-CM | POA: Diagnosis not present

## 2021-04-20 DIAGNOSIS — T8743 Infection of amputation stump, right lower extremity: Secondary | ICD-10-CM | POA: Diagnosis not present

## 2021-04-20 DIAGNOSIS — Z993 Dependence on wheelchair: Secondary | ICD-10-CM

## 2021-04-20 DIAGNOSIS — I5042 Chronic combined systolic (congestive) and diastolic (congestive) heart failure: Secondary | ICD-10-CM | POA: Diagnosis present

## 2021-04-20 DIAGNOSIS — Z91199 Patient's noncompliance with other medical treatment and regimen due to unspecified reason: Secondary | ICD-10-CM | POA: Diagnosis not present

## 2021-04-20 DIAGNOSIS — I5040 Unspecified combined systolic (congestive) and diastolic (congestive) heart failure: Secondary | ICD-10-CM | POA: Diagnosis not present

## 2021-04-20 DIAGNOSIS — I959 Hypotension, unspecified: Secondary | ICD-10-CM | POA: Diagnosis not present

## 2021-04-20 DIAGNOSIS — T8744 Infection of amputation stump, left lower extremity: Principal | ICD-10-CM | POA: Diagnosis present

## 2021-04-20 DIAGNOSIS — Z7982 Long term (current) use of aspirin: Secondary | ICD-10-CM

## 2021-04-20 DIAGNOSIS — M869 Osteomyelitis, unspecified: Secondary | ICD-10-CM

## 2021-04-20 DIAGNOSIS — R627 Adult failure to thrive: Secondary | ICD-10-CM | POA: Diagnosis present

## 2021-04-20 LAB — COMPREHENSIVE METABOLIC PANEL
ALT: 17 U/L (ref 0–44)
AST: 24 U/L (ref 15–41)
Albumin: 2.1 g/dL — ABNORMAL LOW (ref 3.5–5.0)
Alkaline Phosphatase: 106 U/L (ref 38–126)
Anion gap: 13 (ref 5–15)
BUN: 33 mg/dL — ABNORMAL HIGH (ref 6–20)
CO2: 21 mmol/L — ABNORMAL LOW (ref 22–32)
Calcium: 8.2 mg/dL — ABNORMAL LOW (ref 8.9–10.3)
Chloride: 100 mmol/L (ref 98–111)
Creatinine, Ser: 6.13 mg/dL — ABNORMAL HIGH (ref 0.61–1.24)
GFR, Estimated: 10 mL/min — ABNORMAL LOW (ref >60)
Glucose, Bld: 130 mg/dL — ABNORMAL HIGH (ref 70–99)
Potassium: 5.6 mmol/L — ABNORMAL HIGH (ref 3.5–5.1)
Sodium: 134 mmol/L — ABNORMAL LOW (ref 135–145)
Total Bilirubin: 1.6 mg/dL — ABNORMAL HIGH (ref 0.3–1.2)
Total Protein: 5.6 g/dL — ABNORMAL LOW (ref 6.5–8.1)

## 2021-04-20 LAB — HEPARIN LEVEL (UNFRACTIONATED): Heparin Unfractionated: 0.67 IU/mL (ref 0.30–0.70)

## 2021-04-20 LAB — RESP PANEL BY RT-PCR (FLU A&B, COVID) ARPGX2
Influenza A by PCR: NEGATIVE
Influenza B by PCR: NEGATIVE
SARS Coronavirus 2 by RT PCR: NEGATIVE

## 2021-04-20 LAB — APTT: aPTT: 42 seconds — ABNORMAL HIGH (ref 24–36)

## 2021-04-20 LAB — CBC WITH DIFFERENTIAL/PLATELET
Abs Immature Granulocytes: 0.04 10*3/uL (ref 0.00–0.07)
Basophils Absolute: 0.1 10*3/uL (ref 0.0–0.1)
Basophils Relative: 1 %
Eosinophils Absolute: 0.3 10*3/uL (ref 0.0–0.5)
Eosinophils Relative: 3 %
HCT: 36.4 % — ABNORMAL LOW (ref 39.0–52.0)
Hemoglobin: 11.6 g/dL — ABNORMAL LOW (ref 13.0–17.0)
Immature Granulocytes: 0 %
Lymphocytes Relative: 8 %
Lymphs Abs: 0.8 10*3/uL (ref 0.7–4.0)
MCH: 33.4 pg (ref 26.0–34.0)
MCHC: 31.9 g/dL (ref 30.0–36.0)
MCV: 104.9 fL — ABNORMAL HIGH (ref 80.0–100.0)
Monocytes Absolute: 0.3 10*3/uL (ref 0.1–1.0)
Monocytes Relative: 3 %
Neutro Abs: 8.2 10*3/uL — ABNORMAL HIGH (ref 1.7–7.7)
Neutrophils Relative %: 85 %
Platelets: 165 10*3/uL (ref 150–400)
RBC: 3.47 MIL/uL — ABNORMAL LOW (ref 4.22–5.81)
RDW: 18.5 % — ABNORMAL HIGH (ref 11.5–15.5)
WBC: 9.7 10*3/uL (ref 4.0–10.5)
nRBC: 0.3 % — ABNORMAL HIGH (ref 0.0–0.2)

## 2021-04-20 LAB — LACTIC ACID, PLASMA: Lactic Acid, Venous: 1.7 mmol/L (ref 0.5–1.9)

## 2021-04-20 LAB — GLUCOSE, CAPILLARY
Glucose-Capillary: 111 mg/dL — ABNORMAL HIGH (ref 70–99)
Glucose-Capillary: 123 mg/dL — ABNORMAL HIGH (ref 70–99)

## 2021-04-20 IMAGING — DX DG CHEST 1V
1 series · 1 of 1 positions shown · non-contrast
Comparison: [DATE]

CLINICAL DATA: 51-year-old male presents for evaluation of
foul-smelling drainage from a wound post BKA.

EXAM:
CHEST  1 VIEW

[chest ap]
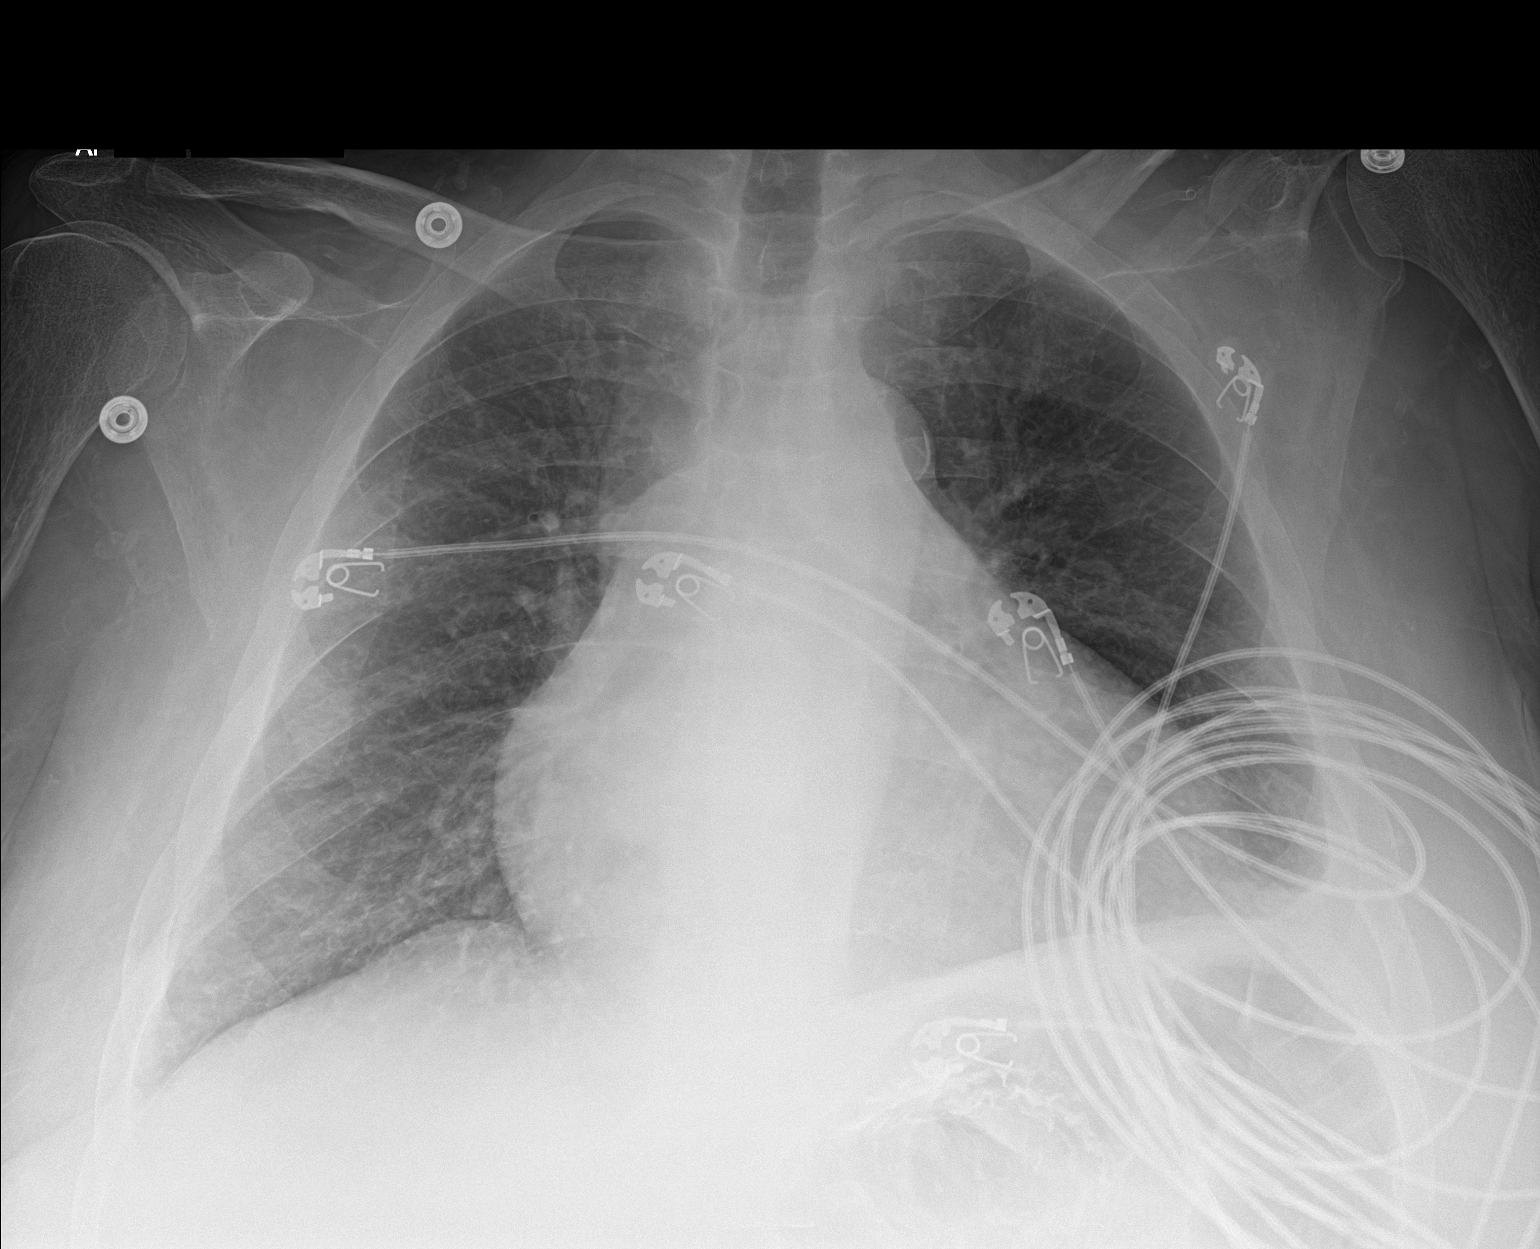

[1 of 1 positions shown; findings below may reference images not displayed]

FINDINGS: EKG leads project over the chest.

Trachea is midline.

Cardiomediastinal contours remain enlarged and extension weighted by
portable technique and patient body habitus and AP projection.

No lobar consolidation.

Mild blunting of LEFT costodiaphragmatic sulcus and suggestion of
mild elevation of the lateral LEFT hemidiaphragm.

No pneumothorax.

On limited assessment there is no acute skeletal finding.
IMPRESSION: Small LEFT-sided effusion.

Cardiomegaly.

## 2021-04-20 MED ORDER — HYDROMORPHONE HCL 1 MG/ML IJ SOLN
0.5000 mg | INTRAMUSCULAR | Status: DC | PRN
  Administered 2021-04-20 – 2021-04-22 (×8): 1 mg via INTRAVENOUS
  Filled 2021-04-20 (×10): qty 1

## 2021-04-20 MED ORDER — ALBUTEROL SULFATE (2.5 MG/3ML) 0.083% IN NEBU: 2.5000 mg | INHALATION_SOLUTION | RESPIRATORY_TRACT | Status: DC | PRN

## 2021-04-20 MED ORDER — SODIUM CHLORIDE 0.9 % IV SOLN
1.0000 g | INTRAVENOUS | Status: DC
  Administered 2021-04-22 – 2021-04-25 (×5): 1 g via INTRAVENOUS
  Filled 2021-04-20 (×6): qty 1

## 2021-04-20 MED ORDER — SODIUM CHLORIDE 0.9 % IV SOLN
2.0000 g | Freq: Once | INTRAVENOUS | Status: AC
  Administered 2021-04-20: 2 g via INTRAVENOUS
  Filled 2021-04-20: qty 2

## 2021-04-20 MED ORDER — DIPHENHYDRAMINE HCL 50 MG/ML IJ SOLN
12.5000 mg | Freq: Four times a day (QID) | INTRAMUSCULAR | Status: DC | PRN
  Administered 2021-04-20 – 2021-04-26 (×8): 12.5 mg via INTRAVENOUS
  Filled 2021-04-20 (×8): qty 1

## 2021-04-20 MED ORDER — SODIUM CHLORIDE 0.9 % IV BOLUS
500.0000 mL | Freq: Once | INTRAVENOUS | Status: AC
  Administered 2021-04-20: 500 mL via INTRAVENOUS

## 2021-04-20 MED ORDER — ONDANSETRON HCL 4 MG PO TABS: 4.0000 mg | ORAL_TABLET | Freq: Four times a day (QID) | ORAL | Status: DC | PRN

## 2021-04-20 MED ORDER — HYDRALAZINE HCL 25 MG PO TABS: 25.0000 mg | ORAL_TABLET | Freq: Four times a day (QID) | ORAL | Status: DC | PRN

## 2021-04-20 MED ORDER — OXYCODONE-ACETAMINOPHEN 5-325 MG PO TABS
1.0000 | ORAL_TABLET | Freq: Once | ORAL | Status: AC
  Administered 2021-04-20: 1 via ORAL
  Filled 2021-04-20: qty 1

## 2021-04-20 MED ORDER — INSULIN ASPART 100 UNIT/ML IJ SOLN: 0.0000 [IU] | Freq: Three times a day (TID) | INTRAMUSCULAR | Status: DC

## 2021-04-20 MED ORDER — SENNOSIDES-DOCUSATE SODIUM 8.6-50 MG PO TABS: 1.0000 | ORAL_TABLET | Freq: Every evening | ORAL | Status: DC | PRN

## 2021-04-20 MED ORDER — HYDROMORPHONE HCL 1 MG/ML IJ SOLN
1.0000 mg | Freq: Once | INTRAMUSCULAR | Status: AC
  Administered 2021-04-20: 1 mg via INTRAVENOUS
  Filled 2021-04-20: qty 1

## 2021-04-20 MED ORDER — METRONIDAZOLE 500 MG/100ML IV SOLN
500.0000 mg | Freq: Two times a day (BID) | INTRAVENOUS | Status: DC
  Administered 2021-04-20 – 2021-04-26 (×11): 500 mg via INTRAVENOUS
  Filled 2021-04-20 (×12): qty 100

## 2021-04-20 MED ORDER — BISACODYL 5 MG PO TBEC: 5.0000 mg | DELAYED_RELEASE_TABLET | Freq: Every day | ORAL | Status: DC | PRN

## 2021-04-20 MED ORDER — SODIUM ZIRCONIUM CYCLOSILICATE 10 G PO PACK
10.0000 g | PACK | Freq: Once | ORAL | Status: AC
  Administered 2021-04-20: 10 g via ORAL
  Filled 2021-04-20: qty 1

## 2021-04-20 MED ORDER — PANTOPRAZOLE SODIUM 40 MG PO TBEC
40.0000 mg | DELAYED_RELEASE_TABLET | Freq: Every day | ORAL | Status: DC
  Administered 2021-04-20 – 2021-05-01 (×12): 40 mg via ORAL
  Filled 2021-04-20 (×12): qty 1

## 2021-04-20 MED ORDER — HEPARIN (PORCINE) 25000 UT/250ML-% IV SOLN
1350.0000 [IU]/h | INTRAVENOUS | Status: DC
  Administered 2021-04-20: 1350 [IU]/h via INTRAVENOUS
  Filled 2021-04-20: qty 250

## 2021-04-20 MED ORDER — VITAMIN D 25 MCG (1000 UNIT) PO TABS
1000.0000 [IU] | ORAL_TABLET | ORAL | Status: DC
  Administered 2021-04-21 – 2021-04-30 (×4): 1000 [IU] via ORAL
  Filled 2021-04-20 (×7): qty 1

## 2021-04-20 MED ORDER — VANCOMYCIN HCL 2000 MG/400ML IV SOLN
2000.0000 mg | Freq: Once | INTRAVENOUS | Status: AC
  Administered 2021-04-20: 2000 mg via INTRAVENOUS
  Filled 2021-04-20: qty 400

## 2021-04-20 MED ORDER — VANCOMYCIN HCL IN DEXTROSE 1-5 GM/200ML-% IV SOLN
1000.0000 mg | INTRAVENOUS | Status: DC
  Administered 2021-04-21 – 2021-04-26 (×2): 1000 mg via INTRAVENOUS
  Filled 2021-04-20 (×4): qty 200

## 2021-04-20 MED ORDER — AMIODARONE HCL 200 MG PO TABS
200.0000 mg | ORAL_TABLET | Freq: Every day | ORAL | Status: DC
  Administered 2021-04-22 – 2021-05-01 (×9): 200 mg via ORAL
  Filled 2021-04-20 (×10): qty 1

## 2021-04-20 MED ORDER — CALCIUM ACETATE (PHOS BINDER) 667 MG PO CAPS
667.0000 mg | ORAL_CAPSULE | Freq: Three times a day (TID) | ORAL | Status: DC
  Administered 2021-04-20 – 2021-05-01 (×24): 667 mg via ORAL
  Filled 2021-04-20 (×26): qty 1

## 2021-04-20 MED ORDER — ATORVASTATIN CALCIUM 10 MG PO TABS
10.0000 mg | ORAL_TABLET | ORAL | Status: DC
  Administered 2021-04-21 – 2021-04-30 (×4): 10 mg via ORAL
  Filled 2021-04-20 (×8): qty 1

## 2021-04-20 MED ORDER — DIPHENHYDRAMINE HCL 50 MG/ML IJ SOLN
12.5000 mg | Freq: Four times a day (QID) | INTRAMUSCULAR | Status: DC | PRN
Start: 2021-04-20 — End: 2021-04-20
  Filled 2021-04-20: qty 1

## 2021-04-20 MED ORDER — BUTALBITAL-APAP-CAFFEINE 50-325-40 MG PO TABS: 1.0000 | ORAL_TABLET | Freq: Four times a day (QID) | ORAL | Status: DC | PRN

## 2021-04-20 MED ORDER — ACETAMINOPHEN 325 MG PO TABS: 650.0000 mg | ORAL_TABLET | Freq: Four times a day (QID) | ORAL | Status: DC | PRN

## 2021-04-20 MED ORDER — ASPIRIN 81 MG PO CHEW
81.0000 mg | CHEWABLE_TABLET | Freq: Every day | ORAL | Status: DC
  Administered 2021-04-20 – 2021-04-21 (×2): 81 mg via ORAL
  Filled 2021-04-20 (×2): qty 1

## 2021-04-20 MED ORDER — OXYCODONE HCL 5 MG PO TABS
5.0000 mg | ORAL_TABLET | Freq: Four times a day (QID) | ORAL | Status: DC | PRN
  Administered 2021-04-20 – 2021-04-25 (×6): 5 mg via ORAL
  Filled 2021-04-20 (×6): qty 1

## 2021-04-20 MED ORDER — ONDANSETRON HCL 4 MG/2ML IJ SOLN
4.0000 mg | Freq: Four times a day (QID) | INTRAMUSCULAR | Status: DC | PRN
  Administered 2021-04-22: 4 mg via INTRAVENOUS
  Filled 2021-04-20 (×2): qty 2

## 2021-04-20 MED ORDER — TIZANIDINE HCL 4 MG PO TABS
4.0000 mg | ORAL_TABLET | Freq: Every day | ORAL | Status: DC | PRN
  Administered 2021-04-20 – 2021-04-28 (×2): 4 mg via ORAL
  Filled 2021-04-20 (×2): qty 1
  Filled 2021-04-20: qty 2

## 2021-04-20 MED ORDER — SODIUM ZIRCONIUM CYCLOSILICATE 10 G PO PACK: 10.0000 g | PACK | Freq: Once | ORAL | Status: DC

## 2021-04-20 MED ORDER — DIPHENHYDRAMINE HCL 25 MG PO CAPS
25.0000 mg | ORAL_CAPSULE | Freq: Four times a day (QID) | ORAL | Status: DC | PRN
  Administered 2021-04-24 – 2021-05-01 (×8): 25 mg via ORAL
  Filled 2021-04-20 (×8): qty 1

## 2021-04-20 NOTE — ED Provider Notes (Signed)
?[facility] ?Provider Note ? ? ?CSN: [id number] ?Arrival date & time: 04/20/21  1153 ? ?  ? ?History ? ?Chief Complaint  ?Patient presents with  ? Post-op Problem  ? ? ?Brendan Campbell is a 51 y.o. male. The patient presents to the emergency department via EMS with concerns about a post-operative surgical infection. The patient was seen by vascular and vein today for a post op visit.  ?The patient had left BKA on 02/16/2021 by Dr. Unk [name]  On 3/10 the patient called about purulent drainage. At f/u appointment today patient reported increased pain. Left BKA found to be unsalvageable. Dr. Virl Campbell plans on [facility] on Thursday and sent patient to ED for admission for pain control and abx. PMH s/p bilateral BKA, neuropathy, type 2 diabetes with chronic kidney disease, hyperlipidemia, hyperkalemia, end-stage renal disease on hemodialysis, PVD, chronic A-fib, congestive heart failure. ? ?HPI ? ?  ? ?Home Medications ?Prior to Admission medications   ?Medication Sig Start Date End Date Taking? Authorizing Provider  ?acetaminophen (TYLENOL) 325 MG tablet Take 2 tablets (650 mg total) by mouth every 6 (six) hours as needed for mild pain, moderate pain or fever. 02/06/21   Cherene Altes, MD  ?albuterol (VENTOLIN HFA) 108 (90 Base) MCG/ACT inhaler Inhale 2 puffs into the lungs every 4 (four) hours as needed for wheezing or shortness of breath. 07/21/20   Manuella Ghazi, Pratik D, DO  ?amiodarone (PACERONE) 200 MG tablet Take 200 mg by mouth daily. 03/29/21   [provider]  ?apixaban (ELIQUIS) 5 MG TABS tablet Take 1 tablet (5 mg total) by mouth 2 (two) times daily. 08/23/19   Strader, Fransisco Hertz, PA-C  ?aspirin 81 MG chewable tablet Chew 81 mg by mouth daily.    [provider]  ?atorvastatin (LIPITOR) 10 MG tablet Take 1 tablet (10 mg total) by mouth every evening. ?Patient taking differently: Take 10 mg by mouth every evening. Take 1 tablet on Monday, Wednesday and Friday. 08/03/19    Barton Dubois, MD  ?butalbital-acetaminophen-caffeine (FIORICET) [phone] MG tablet Take 1 tablet by mouth every 6 (six) hours as needed for headache or migraine. 07/21/20   Manuella Ghazi, Pratik D, DO  ?calcium acetate (PHOSLO) 667 MG capsule Take 1 capsule (667 mg total) by mouth 3 (three) times daily with meals. 02/21/19   Donnamae Jude, MD  ?calcium carbonate (TUMS - DOSED IN MG ELEMENTAL CALCIUM) 500 MG chewable tablet Chew 4 tablets (800 mg of elemental calcium total) by mouth 2 (two) times daily. 08/03/19   Barton Dubois, MD  ?carvedilol (COREG) 3.125 MG tablet Take 1 tablet (3.125 mg total) by mouth 2 (two) times daily with a meal. ?Patient not taking: Reported on 12/07/2020 04/09/20 11/26/20  Deatra Alger, MD  ?cephALEXin (KEFLEX) 500 MG capsule Take 1 capsule (500 mg total) by mouth 2 (two) times daily. ?Patient not taking: Reported on 04/09/2021 03/18/21   Gabriel Earing, PA-C  ?Cholecalciferol (VITAMIN D3) 25 MCG (1000 UT) CAPS Take 1 capsule by mouth See admin instructions. Take before Dialysis on Monday Wednesday and Fridays    [provider]  ?LANTUS 100 UNIT/ML injection Inject 5-10 Units into the skin every evening. 03/23/21   [provider]  ?oxyCODONE (OXY IR/ROXICODONE) 5 MG immediate release tablet Take 1 tablet (5 mg total) by mouth every 6 (six) hours as needed for severe pain. ?Patient not taking: Reported on 04/09/2021 02/19/21   Gabriel Earing, PA-C  ?pantoprazole (PROTONIX) 40 MG tablet Take  40 mg by mouth daily. 03/23/21   [provider]  ?tiZANidine (ZANAFLEX) 4 MG tablet Take 4 mg by mouth daily as needed. 03/23/21   [provider]  ?traMADol (ULTRAM) 50 MG tablet Take 1 tablet (50 mg total) by mouth every 12 (twelve) hours as needed. 6/65/99   Delora Fuel, MD  ?   ? ?Allergies    ?Tape and Chlorhexidine gluconate [chlorhexidine]   ? ?Review of Systems   ?Review of Systems  ?Constitutional:  Negative for fever.  ?Respiratory:  Negative for shortness of  breath.   ?Cardiovascular:  Negative for chest pain.  ?Gastrointestinal:  Negative for abdominal pain, nausea and vomiting.  ?Musculoskeletal:  Positive for arthralgias.  ?     Pain at left BKA stump  ? ?Physical Exam ?Updated Vital Signs ?BP 108/83   Pulse 70   Temp (!) 95.9 ?F (35.5 ?C)   Resp 13   SpO2 100%  ?Physical Exam ?Constitutional:   ?   General: He is not in acute distress. ?HENT:  ?   Head: Normocephalic.  ?Eyes:  ?   Conjunctiva/sclera: Conjunctivae normal.  ?Cardiovascular:  ?   Rate and Rhythm: Normal rate and regular rhythm.  ?Pulmonary:  ?   Effort: Pulmonary effort is normal. No respiratory distress.  ?Abdominal:  ?   Palpations: Abdomen is soft.  ?Musculoskeletal:  ?   Cervical back: Normal range of motion.  ?   Comments: Left BKA wrapped by vascular and vein. Images in their chart from today's visit. Reported dehiscence and purulent drainage visible on image  ?Neurological:  ?   Mental Status: He is alert.  ? ? ?ED Results / Procedures / Treatments   ?Labs ?(all labs ordered are listed, but only abnormal results are displayed) ?Labs Reviewed  ?COMPREHENSIVE METABOLIC PANEL - Abnormal; Notable for the following components:  ?    Result Value  ? Sodium 134 (*)   ? Potassium 5.6 (*)   ? CO2 21 (*)   ? Glucose, Bld 130 (*)   ? BUN 33 (*)   ? Creatinine, Ser 6.13 (*)   ? Calcium 8.2 (*)   ? Total Protein 5.6 (*)   ? Albumin 2.1 (*)   ? Total Bilirubin 1.6 (*)   ? GFR, Estimated 10 (*)   ? All other components within normal limits  ?CBC WITH DIFFERENTIAL/PLATELET - Abnormal; Notable for the following components:  ? RBC 3.47 (*)   ? Hemoglobin 11.6 (*)   ? HCT 36.4 (*)   ? MCV 104.9 (*)   ? RDW 18.5 (*)   ? nRBC 0.3 (*)   ? Neutro Abs 8.2 (*)   ? All other components within normal limits  ?RESP PANEL BY RT-PCR (FLU A&B, COVID) [id number]  ?CULTURE, BLOOD (ROUTINE X 2)  ?CULTURE, BLOOD (ROUTINE X 2)  ?LACTIC ACID, PLASMA  ?HEMOGLOBIN A1C  ? ? ?EKG ?None ? ?Radiology ?DG Chest 1 View ? ?Result Date:  04/20/2021 ?CLINICAL DATA:  51 year old male presents for evaluation of foul-smelling drainage from a wound post BKA. EXAM: CHEST  1 VIEW COMPARISON:  April 09, 2021 FINDINGS: EKG leads project over the chest. Trachea is midline. Cardiomediastinal contours remain enlarged and extension weighted by portable technique and patient body habitus and AP projection. No lobar consolidation. Mild blunting of LEFT costodiaphragmatic sulcus and suggestion of mild elevation of the lateral LEFT hemidiaphragm. No pneumothorax. On limited assessment there is no acute skeletal finding. IMPRESSION: Small LEFT-sided effusion. Cardiomegaly. Electronically Signed  By: Zetta Bills M.D.   On: 04/20/2021 14:03   ? ?Procedures ?Marland KitchenCritical Care ?Performed by: Dorothyann Peng, PA-C ?Authorized by: Dorothyann Peng, PA-C  ? ?Critical care provider statement:  ?  Critical care time (minutes):  30 ?  Critical care time was exclusive of:  Separately billable procedures and treating other patients ?  Critical care was necessary to treat or prevent imminent or life-threatening deterioration of the following conditions:  Sepsis ?  Critical care was time spent personally by me on the following activities:  Development of treatment plan with patient or surrogate, discussions with consultants, evaluation of patient's response to treatment, examination of patient, ordering and review of laboratory studies, ordering and review of radiographic studies, ordering and performing treatments and interventions, pulse oximetry, re-evaluation of patient's condition and review of old charts ?  Care discussed with: admitting provider    ? ? ?Medications Ordered in ED ?Medications  ?metroNIDAZOLE (FLAGYL) IVPB 500 mg (500 mg Intravenous New Bag/Given 04/20/21 1423)  ?ceFEPIme (MAXIPIME) 2 g in sodium chloride 0.9 % 100 mL IVPB (2 g Intravenous New Bag/Given 04/20/21 1420)  ?vancomycin (VANCOCIN) IVPB 1000 mg/200 mL premix (has no administration in time range)   ?ceFEPIme (MAXIPIME) 1 g in sodium chloride 0.9 % 100 mL IVPB (has no administration in time range)  ?acetaminophen (TYLENOL) tablet 650 mg (has no administration in time range)  ?aspirin chewable tablet 81 mg

## 2021-04-20 NOTE — Consult Note (Signed)
[facility]  ?INPATIENT CONSULTATION ? ?Reason for Consultation: ESRD ?Requesting Provider:  Dr. Roosevelt Locks ? ?HPI: Brendan Campbell is an 51 y.o. male with ESRD on HD,  HL, PVD, anemia of ESRD, depression who is seen for evaluation and management of ESRD and assoc conditions.  ? ?Went to [facility] today for f/u 02/16/21 BKA f/u - found to have wound dehiscence and infection.  Sent to ED for admission for IV antibiotics; he tells me OR planned Thursday.  He's receiving vanc, cefepime and flagyl.  ? ?He dialyzes at [facility] MWF but did miss Monday due to pain.  He hasn't been eating/drinking much due to pain.  Denies dyspnea, orthopnea.  ? ?Labs showing Na 134, K 5.6, BUN 33, Cr 6.1, Alb 2.1, WBC 9.7, Hb 11.6, Plt 165.  ?COVID neg.  ? ?PMH: ?Past Medical History:  ?Diagnosis Date  ? Anemia   ? Anxiety   ? Blood transfusion without reported diagnosis   ? CHF (congestive heart failure) ([place])   ? a. EF 25% by echo in 07/2019  ? Chronic kidney disease   ? Depression   ? Diabetes mellitus without complication ([place])   ? End stage renal disease ([place])   ? M/W/F [name]  ? Hyperlipidemia   ? Neuropathy   ? Peripheral vascular disease ([place])   ? PTSD (post-traumatic stress disorder)   ? ?PSH: ?Past Surgical History:  ?Procedure Laterality Date  ? A/V FISTULAGRAM N/A 10/09/2018  ? Procedure: A/V FISTULAGRAM;  Surgeon: Serafina Mitchell, MD;  Location: [place] [facility];  Service: Cardiovascular;  Laterality: N/A;  ? AMPUTATION Left 02/16/2021  ? Procedure: AMPUTATION BELOW KNEE;  Surgeon: Broadus John, MD;  Location: [place];  Service: Vascular;  Laterality: Left;  ? AV FISTULA PLACEMENT Left 09/22/2016  ? Procedure: CREATION OF LEFT ARM ARTERIOVENOUS (AV) FISTULA;  Surgeon: Waynetta Sandy, MD;  Location: [place];  Service: Vascular;  Laterality: Left;  ? AV FISTULA PLACEMENT Left 10/31/2017  ? Procedure: INSERTION OF ARTERIOVENOUS (AV) GORE-TEX 4-32mm STETCH GRAFT LEFT ARM;  Surgeon: Waynetta Sandy, MD;   Location: [place];  Service: Vascular;  Laterality: Left;  ? BASCILIC VEIN TRANSPOSITION Left 08/17/2017  ? Procedure: SECOND STAGE BASILIC VEIN TRANSPOSITION LEFT ARM;  Surgeon: Waynetta Sandy, MD;  Location: [place];  Service: Vascular;  Laterality: Left;  ? BELOW KNEE LEG AMPUTATION Right   ? CARDIOVERSION N/A 02/19/2019  ? Procedure: CARDIOVERSION;  Surgeon: [name], MD;  Location: [facility];  Service: Cardiovascular;  Laterality: N/A;  ? CATARACT EXTRACTION W/PHACO Right 10/23/2020  ? Procedure: CATARACT EXTRACTION PHACO AND INTRAOCULAR LENS PLACEMENT (IOC);  Surgeon: Baruch Goldmann, MD;  Location: [facility];  Service: Ophthalmology;  Laterality: Right;  CDE 39.03  ? CHOLECYSTECTOMY    ? FOOT SURGERY    ? IR FLUORO GUIDE CV LINE RIGHT  05/16/2016  ? IR FLUORO GUIDE CV LINE RIGHT  02/16/2019  ? IR REMOVAL TUN CV CATH W/O FL  05/16/2016  ? IR REMOVAL TUN CV CATH W/O FL  02/19/2019  ? IR THROMBECTOMY AV FISTULA W/THROMBOLYSIS/PTA INC/SHUNT/IMG LEFT Left 11/09/2018  ? IR THROMBECTOMY AV FISTULA W/THROMBOLYSIS/PTA INC/SHUNT/IMG LEFT Left 06/22/2019  ? IR US GUIDE VASC ACCESS LEFT  11/09/2018  ? IR US GUIDE VASC ACCESS LEFT  06/22/2019  ? IR US GUIDE VASC ACCESS RIGHT  05/16/2016  ? IR US GUIDE VASC ACCESS RIGHT  02/16/2019  ? PERIPHERAL VASCULAR BALLOON ANGIOPLASTY  10/09/2018  ? Procedure: PERIPHERAL  VASCULAR BALLOON ANGIOPLASTY;  Surgeon: Serafina Mitchell, MD;  Location: [place] [facility];  Service: Cardiovascular;;  ? TEE WITHOUT CARDIOVERSION N/A 02/19/2019  ? Procedure: TRANSESOPHAGEAL ECHOCARDIOGRAM (TEE);  Surgeon: [name], MD;  Location: [place];  Service: Cardiovascular;  Laterality: N/A;  ? ? ?Past Medical History:  ?Diagnosis Date  ? Anemia   ? Anxiety   ? Blood transfusion without reported diagnosis   ? CHF (congestive heart failure) ([place])   ? a. EF 25% by echo in 07/2019  ? Chronic kidney disease   ? Depression   ? Diabetes mellitus without complication ([place])   ? End stage renal disease ([place])    ? M/W/F [name]  ? Hyperlipidemia   ? Neuropathy   ? Peripheral vascular disease ([place])   ? PTSD (post-traumatic stress disorder)   ? ? ?Medications: ? I have reviewed the patient's current medications. ? ?(Not in a hospital admission) ? ? ?ALLERGIES: ?  ?Allergies  ?Allergen Reactions  ? Tape Other (See Comments)  ?  Pulls skin off  ? Chlorhexidine Gluconate [Chlorhexidine] Rash  ? ? ?FAM HX: ?Family History  ?Problem Relation Age of Onset  ? Cancer Mother   ?     lung  ? Diabetes Mother   ? Heart attack Father   ? Diabetes Father   ? Diabetes Sister   ? ? ?Social History:  ? reports that he quit smoking about 14 years ago. His smoking use included cigarettes. He has never used smokeless tobacco. He reports that he does not drink alcohol and does not use drugs. ? ?ROS: 12 system ROS neg except per HPI ? ?Blood pressure 108/83, pulse 70, temperature (!) 95.9 ?F (35.5 ?C), resp. rate 13, SpO2 100 %. ?PHYSICAL EXAM: ?General appearance: fatigued, no distress, and chronically ill-appearing ?Resp: clear to auscultation bilaterally ?Cardio: regular rate and rhythm, S1, S2 normal, no murmur, click, rub or gallop ?GI: soft, non-tender; bowel sounds normal; no masses,  no organomegaly ?Extremities: s/p bilateral BKA's, LBKA photo noted of wound in [facility] note, LUE AVF +T/B ?  ?Results for orders placed or performed during the hospital encounter of 04/20/21 (from the past 48 hour(s))  ?Comprehensive metabolic panel     Status: Abnormal  ? Collection Time: 04/20/21 12:34 PM  ?Result Value Ref Range  ? Sodium 134 (L) 135 - 145 mmol/L  ? Potassium 5.6 (H) 3.5 - 5.1 mmol/L  ? Chloride 100 98 - 111 mmol/L  ? CO2 21 (L) 22 - 32 mmol/L  ? Glucose, Bld 130 (H) 70 - 99 mg/dL  ?  Comment: Glucose reference range applies only to samples taken after fasting for at least 8 hours.  ? BUN 33 (H) 6 - 20 mg/dL  ? Creatinine, Ser 6.13 (H) 0.61 - 1.24 mg/dL  ? Calcium 8.2 (L) 8.9 - 10.3 mg/dL  ? Total Protein 5.6 (L) 6.5 - 8.1 g/dL  ? Albumin  2.1 (L) 3.5 - 5.0 g/dL  ? AST 24 15 - 41 U/L  ? ALT 17 0 - 44 U/L  ? Alkaline Phosphatase 106 38 - 126 U/L  ? Total Bilirubin 1.6 (H) 0.3 - 1.2 mg/dL  ? GFR, Estimated 10 (L) >60 mL/min  ?  Comment: (NOTE) ?Calculated using the CKD-EPI Creatinine Equation (2021) ?  ? Anion gap 13 5 - 15  ?  Comment: Performed at [facility], [place] [address], [place], [place] [address]  ?CBC with Differential     Status: Abnormal  ?  Collection Time: 04/20/21 12:34 PM  ?Result Value Ref Range  ? WBC 9.7 4.0 - 10.5 K/uL  ? RBC 3.47 (L) 4.22 - 5.81 MIL/uL  ? Hemoglobin 11.6 (L) 13.0 - 17.0 g/dL  ? HCT 36.4 (L) 39.0 - 52.0 %  ? MCV 104.9 (H) 80.0 - 100.0 fL  ? MCH 33.4 26.0 - 34.0 pg  ? MCHC 31.9 30.0 - 36.0 g/dL  ? RDW 18.5 (H) 11.5 - 15.5 %  ? Platelets 165 150 - 400 K/uL  ? nRBC 0.3 (H) 0.0 - 0.2 %  ? Neutrophils Relative % 85 %  ? Neutro Abs 8.2 (H) 1.7 - 7.7 K/uL  ? Lymphocytes Relative 8 %  ? Lymphs Abs 0.8 0.7 - 4.0 K/uL  ? Monocytes Relative 3 %  ? Monocytes Absolute 0.3 0.1 - 1.0 K/uL  ? Eosinophils Relative 3 %  ? Eosinophils Absolute 0.3 0.0 - 0.5 K/uL  ? Basophils Relative 1 %  ? Basophils Absolute 0.1 0.0 - 0.1 K/uL  ? Immature Granulocytes 0 %  ? Abs Immature Granulocytes 0.04 0.00 - 0.07 K/uL  ?  Comment: Performed at [facility], [place] [address], [place], [place] [address]  ?Lactic acid, plasma     Status: None  ? Collection Time: 04/20/21  1:14 PM  ?Result Value Ref Range  ? Lactic Acid, Venous 1.7 0.5 - 1.9 mmol/L  ?  Comment: Performed at [facility], [place] [address], [place], [place] [address]  ? ? ?DG Chest 1 View ? ?Result Date: 04/20/2021 ?CLINICAL DATA:  51 year old male presents for evaluation of foul-smelling drainage from a wound post BKA. EXAM: CHEST  1 VIEW COMPARISON:  April 09, 2021 FINDINGS: EKG leads project over the chest. Trachea is midline. Cardiomediastinal contours remain enlarged and extension weighted by portable technique and patient body habitus and AP projection. No lobar  consolidation. Mild blunting of LEFT costodiaphragmatic sulcus and suggestion of mild elevation of the lateral LEFT hemidiaphragm. No pneumothorax. On limited assessment there is no acute skeletal finding. IMPRE

## 2021-04-20 NOTE — H&P (Signed)
History and Physical    [name]:[id number] DOB: 1970-08-21 DOA: 04/20/2021  PCP: Kirstie Peri, MD (Confirm with patient/family/NH records and if not entered, this has to be entered at [facility] point of entry) Patient coming from: Home  I have personally briefly reviewed patient's old medical records in [facility]  Chief Complaint: Left leg pain, swelling and foul smell  HPI: Brendan Campbell is a 51 y.o. 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  Patient underwent left leg BKA on 02/16/2021.  During office visit in early February, it was found to incision wound was slowly few.  Patient was last evaluated on 2/22, when patient started to complain about increasing pain of the left BKA, and vascular surgery was concerned about poor clearing of the left BKA wound at that time and several staples were removed.  In the last few days, patient found that the pain associated with left BKA site has been increasing, with swelling and full smell.  Denies any fever chills.  Today, patient went to see vascular surgery who was concerned about left BKA site deep tissue infection and sent patient to ED.  Last HD was last Friday, patient missed Monday's dialysis due to severe pain of his leg.  Patient denies any chest pain, no shortness of breath.  ED Course: Blood pressure borderline low, lactic acid 1.7, WBC 9.7.  Review of Systems: As per HPI otherwise 14 point review of systems negative.    Past Medical History:  Diagnosis Date   Anemia    Anxiety    Blood transfusion without reported diagnosis    CHF (congestive heart failure) (HCC)    a. EF 25% by echo in 07/2019   Chronic kidney disease    Depression    Diabetes mellitus without complication (HCC)    End stage  renal disease (HCC)    M/W/F [facility]   Hyperlipidemia    Neuropathy    Peripheral vascular disease (HCC)    PTSD (post-traumatic stress disorder)     Past Surgical History:  Procedure Laterality Date   A/V FISTULAGRAM N/A 10/09/2018   Procedure: A/V FISTULAGRAM;  Surgeon: Nada Libman, MD;  [facility] INV[facility] LAB;  Service: Cardiovascular;  Laterality: N/A;   AMPUTATION Left 02/16/2021   Procedure: AMPUTATION BELOW KNEE;  Surgeon: Victorino Sparrow, MD;  Location: [facility] OR;  Service: Vascular;  Laterality: Left;   AV FISTULA PLACEMENT Left 09/22/2016   Procedure: CREATION OF LEFT ARM ARTERIOVENOUS (AV) FISTULA;  Surgeon: Maeola Harman, MD;  Location: [facility] OR;  Service: Vascular;  Laterality: Left;   AV FISTULA PLACEMENT Left 10/31/2017   Procedure: INSERTION OF ARTERIOVENOUS (AV) GORE-TEX 4-17mm STETCH GRAFT LEFT ARM;  Surgeon: Maeola Harman, MD;  Location: [facility] OR;  Service: Vascular;  Laterality: Left;   BASCILIC VEIN TRANSPOSITION Left 08/17/2017   Procedure: SECOND STAGE BASILIC VEIN TRANSPOSITION LEFT ARM;  Surgeon: Maeola Harman, MD;  Location: [facility] OR;  Service: Vascular;  Laterality: Left;   BELOW KNEE LEG AMPUTATION Right    CARDIOVERSION N/A 02/19/2019   Procedure: CARDIOVERSION;  Surgeon: Chrystie Nose, MD;  Location: [facility] ENDOSCOPY;  Service: Cardiovascular;  Laterality: N/A;   CATARACT EXTRACTION W/PHACO Right 10/23/2020   Procedure: CATARACT EXTRACTION PHACO AND INTRAOCULAR LENS PLACEMENT (IOC);  Surgeon: Fabio Pierce, MD;  Location: [facility] ORS;  Service: Ophthalmology;  Laterality: Right;  CDE 39.03   CHOLECYSTECTOMY     FOOT SURGERY     IR FLUORO GUIDE CV LINE RIGHT  05/16/2016   IR FLUORO GUIDE CV LINE RIGHT  02/16/2019   IR REMOVAL TUN CV CATH W/O FL  05/16/2016   IR REMOVAL TUN CV CATH W/O FL  02/19/2019   IR THROMBECTOMY AV FISTULA W/THROMBOLYSIS/PTA INC/SHUNT/IMG LEFT Left 11/09/2018   IR THROMBECTOMY AV FISTULA W/THROMBOLYSIS/PTA  INC/SHUNT/IMG LEFT Left 06/22/2019   IR US GUIDE VASC ACCESS LEFT  11/09/2018   IR US GUIDE VASC ACCESS LEFT  06/22/2019   IR US GUIDE VASC ACCESS RIGHT  05/16/2016   IR US GUIDE VASC ACCESS RIGHT  02/16/2019   PERIPHERAL VASCULAR BALLOON ANGIOPLASTY  10/09/2018   Procedure: PERIPHERAL VASCULAR BALLOON ANGIOPLASTY;  Surgeon: Nada Libman, MD;  [facility];  Service: Cardiovascular;;   TEE WITHOUT CARDIOVERSION N/A 02/19/2019   Procedure: TRANSESOPHAGEAL ECHOCARDIOGRAM (TEE);  Surgeon: Chrystie Nose, MD;  Location: [facility] ENDOSCOPY;  Service: Cardiovascular;  Laterality: N/A;     reports that he quit smoking about 14 years ago. His smoking use included cigarettes. He has never used smokeless tobacco. He reports that he does not drink alcohol and does not use drugs.  Allergies  Allergen Reactions   Tape Other (See Comments)    Pulls skin off   Chlorhexidine Gluconate [Chlorhexidine] Rash    Family History  Problem Relation Age of Onset   Cancer Mother        lung   Diabetes Mother    Heart attack Father    Diabetes Father    Diabetes Sister      Prior to Admission medications   Medication Sig Start Date End Date Taking? Authorizing Provider  acetaminophen (TYLENOL) 325 MG tablet Take 2 tablets (650 mg total) by mouth every 6 (six) hours as needed for mild pain, moderate pain or fever. 02/06/21   Lonia Blood, MD  albuterol (VENTOLIN HFA) 108 (90 Base) MCG/ACT inhaler Inhale 2 puffs into the lungs every 4 (four) hours as needed for wheezing or shortness of breath. 07/21/20   Sherryll Burger, Pratik D, DO  amiodarone (PACERONE) 200 MG tablet Take 200 mg by mouth daily. 03/29/21   [provider]  apixaban (ELIQUIS) 5 MG TABS tablet Take 1 tablet (5 mg total) by mouth 2 (two) times daily. 08/23/19   Strader, Lennart Pall, PA-C  aspirin 81 MG chewable tablet Chew 81 mg by mouth daily.    [provider]  atorvastatin (LIPITOR) 10 MG tablet Take 1 tablet (10 mg total)  by mouth every evening. Patient taking differently: Take 10 mg by mouth every evening. Take 1 tablet on Monday, Wednesday and Friday. 08/03/19   Vassie Loll, MD  butalbital-acetaminophen-caffeine (FIORICET) [id number] MG tablet Take 1 tablet by mouth every 6 (six) hours as needed for headache or migraine. 07/21/20   Sherryll Burger, Pratik D, DO  calcium acetate (PHOSLO) 667 MG capsule Take 1 capsule (667 mg total) by mouth 3 (three) times daily with meals. 02/21/19   Reva Bores, MD  calcium carbonate (TUMS - DOSED IN MG ELEMENTAL CALCIUM) 500 MG chewable tablet Chew 4 tablets (800 mg of elemental calcium total) by mouth 2 (two) times daily. 08/03/19   Vassie Loll, MD  carvedilol (COREG) 3.125 MG tablet Take 1 tablet (3.125 mg total) by mouth 2 (two) times daily with a meal. Patient not  taking: Reported on 12/07/2020 04/09/20 11/26/20  Kendell Bane, MD  cephALEXin (KEFLEX) 500 MG capsule Take 1 capsule (500 mg total) by mouth 2 (two) times daily. Patient not taking: Reported on 04/09/2021 03/18/21   Dara Lords, PA-C  Cholecalciferol (VITAMIN D3) 25 MCG (1000 UT) CAPS Take 1 capsule by mouth See admin instructions. Take before Dialysis on Monday Wednesday and Fridays    [provider]  LANTUS 100 UNIT/ML injection Inject 5-10 Units into the skin every evening. 03/23/21   [provider]  oxyCODONE (OXY IR/ROXICODONE) 5 MG immediate release tablet Take 1 tablet (5 mg total) by mouth every 6 (six) hours as needed for severe pain. Patient not taking: Reported on 04/09/2021 02/19/21   Dara Lords, PA-C  pantoprazole (PROTONIX) 40 MG tablet Take 40 mg by mouth daily. 03/23/21   [provider]  tiZANidine (ZANAFLEX) 4 MG tablet Take 4 mg by mouth daily as needed. 03/23/21   [provider]  traMADol (ULTRAM) 50 MG tablet Take 1 tablet (50 mg total) by mouth every 12 (twelve) hours as needed. 03/27/21   Dione Booze, MD    Physical Exam: Vitals:   04/20/21 1203  04/20/21 1215 04/20/21 1400  BP: 105/83 99/70 108/83  Pulse: 71 70   Resp: 11 14 13   Temp: (!) 95.9 F (35.5 C)    SpO2: 100% 100%     Constitutional: NAD, calm, comfortable Vitals:   04/20/21 1203 04/20/21 1215 04/20/21 1400  BP: 105/83 99/70 108/83  Pulse: 71 70   Resp: 11 14 13   Temp: (!) 95.9 F (35.5 C)    SpO2: 100% 100%    Eyes: PERRL, lids and conjunctivae normal ENMT: Mucous membranes are moist. Posterior pharynx clear of any exudate or lesions.Normal dentition.  Neck: normal, supple, no masses, no thyromegaly Respiratory: clear to auscultation bilaterally, no wheezing, no crackles. Normal respiratory effort. No accessory muscle use.  Cardiovascular: Regular rate and rhythm, no murmurs / rubs / gallops. No extremity edema. 2+ pedal pulses. No carotid bruits.  Abdomen: no tenderness, no masses palpated. No hepatosplenomegaly. Bowel sounds positive.  Musculoskeletal: no clubbing / cyanosis. No joint deformity upper and lower extremities. Good ROM, no contractures. Normal muscle tone.  Skin: Left BKA ulcer as shown in the picture with thin discharge and full smell. Neurologic: CN 2-12 grossly intact. Sensation intact, DTR normal. Strength 5/5 in all 4.  Psychiatric: Normal judgment and insight. Alert and oriented x 3. Normal mood.    Labs on Admission: I have personally reviewed following labs and imaging studies  CBC: Recent Labs  Lab 04/20/21 1234  WBC 9.7  NEUTROABS 8.2*  HGB 11.6*  HCT 36.4*  MCV 104.9*  PLT 165   Basic Metabolic Panel: Recent Labs  Lab 04/20/21 1234  NA 134*  K 5.6*  CL 100  CO2 21*  GLUCOSE 130*  BUN 33*  CREATININE 6.13*  CALCIUM 8.2*   GFR: Estimated Creatinine Clearance: 11.2 mL/min (A) (by C-G formula based on SCr of 6.13 mg/dL (H)). Liver Function Tests: Recent Labs  Lab 04/20/21 1234  AST 24  ALT 17  ALKPHOS 106  BILITOT 1.6*  PROT 5.6*  ALBUMIN 2.1*   No results for input(s): LIPASE, AMYLASE in the last 168  hours. No results for input(s): AMMONIA in the last 168 hours. Coagulation Profile: No results for input(s): INR, PROTIME in the last 168 hours. Cardiac Enzymes: No results for input(s): CKTOTAL, CKMB, CKMBINDEX, TROPONINI in the last 168 hours.  BNP (last 3 results) No results for input(s): PROBNP in the last 8760 hours. HbA1C: No results for input(s): HGBA1C in the last 72 hours. CBG: No results for input(s): GLUCAP in the last 168 hours. Lipid Profile: No results for input(s): CHOL, HDL, LDLCALC, TRIG, CHOLHDL, LDLDIRECT in the last 72 hours. Thyroid Function Tests: No results for input(s): TSH, T4TOTAL, FREET4, T3FREE, THYROIDAB in the last 72 hours. Anemia Panel: No results for input(s): VITAMINB12, FOLATE, FERRITIN, TIBC, IRON, RETICCTPCT in the last 72 hours. Urine analysis: No results found for: COLORURINE, APPEARANCEUR, LABSPEC, PHURINE, GLUCOSEU, HGBUR, BILIRUBINUR, KETONESUR, PROTEINUR, UROBILINOGEN, NITRITE, LEUKOCYTESUR  Radiological Exams on Admission: DG Chest 1 View  Result Date: 04/20/2021 CLINICAL DATA:  51 year old male presents for evaluation of foul-smelling drainage from a wound post BKA. EXAM: CHEST  1 VIEW COMPARISON:  April 09, 2021 FINDINGS: EKG leads project over the chest. Trachea is midline. Cardiomediastinal contours remain enlarged and extension weighted by portable technique and patient body habitus and AP projection. No lobar consolidation. Mild blunting of LEFT costodiaphragmatic sulcus and suggestion of mild elevation of the lateral LEFT hemidiaphragm. No pneumothorax. On limited assessment there is no acute skeletal finding. IMPRESSION: Small LEFT-sided effusion. Cardiomegaly. Electronically Signed   By: Donzetta Kohut M.D.   On: 04/20/2021 14:03    EKG: Ordered  Assessment/Plan Principal Problem:   Osteomyelitis (HCC) Active Problems:   Osteomyelitis of ankle or foot, acute, left (HCC)  (please populate well all problems here in Problem List. (For  example, if patient is on BP meds at home and you resume or decide to hold them, it is a problem that needs to be her. Same for CAD, COPD, HLD and so on)  Left BKA infected ulcer -Suspect underlying osteomyelitis and deep tissue infection -Agreed with vancomycin cefepime and Flagyl -Vascular surgeon plans for AKA on Thursday. -Alternative oxycodone and Dilaudid for pain control, continue tizanidine  Hyperkalemia -Secondary to ESRD and missed HD for last 2 sessions.  1 dose of Lokelma, discussed with on-call nephrologist Dr. Jaynie Collins, who will arrange HD.  HTN -Blood pressure borderline low, hold home BP meds, as needed hydralazine for now.  Chronic combined diastolic and systolic CHF -Clinically appears to be euvolemic, chest x-ray reviewed shows no acute infiltrates or signs of fluid overload, hold BP/CHF medications for borderline hypotension  IDDM -Renal dose sliding scale for now.  PAF -Change Eliquis to heparin drip for now until after surgery.  Anemia secondary to CKD -H&H stable.  Continue systemic anticoagulation.  DVT prophylaxis: Heparin drip Code Status: Full code Family Communication: None at bedside Disposition Plan: Complicated with BKA stump infection and osteomyelitis, expect AKA, expect more than 2 midnight hospital stay. Consults called: Vascular surgery Admission status: Tele admission   Emeline General MD [facility] Pager [phone]  04/20/2021, 2:31 PM

## 2021-04-20 NOTE — Progress Notes (Signed)
Pharmacy Antibiotic Note ? ?Brendan Campbell is a 51 y.o. male admitted on 04/20/2021 presenting with possible infection from [place].  Pharmacy has been consulted for vancomycin and cefepime dosing.  ESRD-HD usually MWF, missed Monday ? ?Plan: ?Vancomycin 2000mg  IV x 1, then 1000 mg IV qHD ?Cefepime 2g IV x 1, then 1g IV q 24h ?Monitor HD schedule, Cx, VVS plans, and clinical progression to narrow ?Vancomycin levels as indicated ? ?  ? ?Temp (24hrs), Avg:96.9 ?F (36.1 ?C), Min:95.9 ?F (35.5 ?C), Max:97.8 ?F (36.6 ?C) ? ?Recent Labs  ?Lab 04/20/21 ?1234  ?WBC 9.7  ?CREATININE 6.13*  ?  ?Estimated Creatinine Clearance: 11.2 mL/min (A) (by C-G formula based on SCr of 6.13 mg/dL (H)).   ? ?Allergies  ?Allergen Reactions  ? Tape Other (See Comments)  ?  Pulls skin off  ? Chlorhexidine Gluconate [Chlorhexidine] Rash  ? ? ?Bertis Ruddy, PharmD ?Clinical Pharmacist ?ED Pharmacist Phone # [phone] ?04/20/2021 1:21 PM ? ? ?

## 2021-04-20 NOTE — Progress Notes (Signed)
ANTICOAGULATION CONSULT NOTE - Initial Consult ? ?Pharmacy Consult for heparin ?Indication: atrial fibrillation ? ?Allergies  ?Allergen Reactions  ? Tape Other (See Comments)  ?  Pulls skin off  ? Chlorhexidine Gluconate [Chlorhexidine] Rash  ? ? ?Patient Measurements: ?  ?Heparin Dosing Weight: 93 kg due to non-obese when using pre-amputation height of 6' ? ?Vital Signs: ?Temp: 95.9 ?F (35.5 ?C) (03/14 1203) ?Temp Source: Temporal (03/14 1033) ?BP: 108/83 (03/14 1400) ?Pulse Rate: 70 (03/14 1215) ? ?Labs: ?Recent Labs  ?  04/20/21 ?1234  ?HGB 11.6*  ?HCT 36.4*  ?PLT 165  ?CREATININE 6.13*  ? ? ?Estimated Creatinine Clearance: 11.2 mL/min (A) (by C-G formula based on SCr of 6.13 mg/dL (H)). ? ? ?Medical History: ?Past Medical History:  ?Diagnosis Date  ? Anemia   ? Anxiety   ? Blood transfusion without reported diagnosis   ? CHF (congestive heart failure) ([place])   ? a. EF 25% by echo in 07/2019  ? Chronic kidney disease   ? Depression   ? Diabetes mellitus without complication ([place])   ? End stage renal disease ([place])   ? M/W/F [name]  ? Hyperlipidemia   ? Neuropathy   ? Peripheral vascular disease ([place])   ? PTSD (post-traumatic stress disorder)   ? ? ?Medications:  ?Patient taking apixaban 5 mg BID PTA ?Patient reported the last dose of apixaban was 3/14 in the evening ? ?Assessment: ?Last dose of apixaban was >12 hours ago per patient report. Will start heparin now without bolus per protocol. CBC stable. ? ?Goal of Therapy:  ?aPTT ?Monitor platelets by anticoagulation protocol: Yes ?  ?Plan:  ?Will avoid bolus with DOAC PTA  ?Start heparin at 1350 units/hr ?Baseline heparin level and aPTT ?Heparin level and aPTT in 8 hours ?Daily heparin level and CBC ? ?Thank you for allowing pharmacy to participate in this patient's care. ? ?Reatha Harps, PharmD ?PGY1 Pharmacy Resident ?04/20/2021 3:01 PM ?Check [email or web address] for unit specific pharmacy number ? ? ? ?

## 2021-04-20 NOTE — ED Triage Notes (Signed)
Pt from vascular office for eval of possible infection s/p L BKA earlier this year. MWF dialysis patient and missed Monday. Pt endorses foul smelling drainage from site and that the wound has popped open as some of the staples had been removed but not all of them.  ?

## 2021-04-20 NOTE — ED Notes (Signed)
Attempt x 2 by 2 RNs for IV, unsuccessful.  ?

## 2021-04-21 LAB — CBC
HCT: 36.4 % — ABNORMAL LOW (ref 39.0–52.0)
Hemoglobin: 12.4 g/dL — ABNORMAL LOW (ref 13.0–17.0)
MCH: 35.1 pg — ABNORMAL HIGH (ref 26.0–34.0)
MCHC: 34.1 g/dL (ref 30.0–36.0)
MCV: 103.1 fL — ABNORMAL HIGH (ref 80.0–100.0)
Platelets: 127 10*3/uL — ABNORMAL LOW (ref 150–400)
RBC: 3.53 MIL/uL — ABNORMAL LOW (ref 4.22–5.81)
RDW: 18.6 % — ABNORMAL HIGH (ref 11.5–15.5)
WBC: 10.1 10*3/uL (ref 4.0–10.5)
nRBC: 0.5 % — ABNORMAL HIGH (ref 0.0–0.2)

## 2021-04-21 LAB — BASIC METABOLIC PANEL
Anion gap: 15 (ref 5–15)
BUN: 34 mg/dL — ABNORMAL HIGH (ref 6–20)
CO2: 20 mmol/L — ABNORMAL LOW (ref 22–32)
Calcium: 8.1 mg/dL — ABNORMAL LOW (ref 8.9–10.3)
Chloride: 99 mmol/L (ref 98–111)
Creatinine, Ser: 6.02 mg/dL — ABNORMAL HIGH (ref 0.61–1.24)
GFR, Estimated: 11 mL/min — ABNORMAL LOW (ref >60)
Glucose, Bld: 99 mg/dL (ref 70–99)
Potassium: 4.6 mmol/L (ref 3.5–5.1)
Sodium: 134 mmol/L — ABNORMAL LOW (ref 135–145)

## 2021-04-21 LAB — GLUCOSE, CAPILLARY
Glucose-Capillary: 86 mg/dL (ref 70–99)
Glucose-Capillary: 89 mg/dL (ref 70–99)
Glucose-Capillary: 71 mg/dL (ref 70–99)

## 2021-04-21 LAB — HEPARIN LEVEL (UNFRACTIONATED)
Heparin Unfractionated: >1.1 IU/mL — ABNORMAL HIGH (ref 0.30–0.70)
Heparin Unfractionated: 0.91 IU/mL — ABNORMAL HIGH (ref 0.30–0.70)

## 2021-04-21 LAB — HEMOGLOBIN A1C
Hgb A1c MFr Bld: 5.3 % (ref 4.8–5.6)
Mean Plasma Glucose: 105 mg/dL

## 2021-04-21 LAB — APTT: aPTT: >200 seconds (ref 24–36)

## 2021-04-21 MED ORDER — ACETAMINOPHEN 500 MG PO TABS
1000.0000 mg | ORAL_TABLET | Freq: Once | ORAL | Status: AC
Start: 2021-04-22 — End: 2021-04-22
  Administered 2021-04-22: 1000 mg via ORAL
  Filled 2021-04-21: qty 2

## 2021-04-21 MED ORDER — HEPARIN (PORCINE) 25000 UT/250ML-% IV SOLN
1100.0000 [IU]/h | INTRAVENOUS | Status: DC
  Administered 2021-04-21: 1100 [IU]/h via INTRAVENOUS
  Filled 2021-04-21: qty 250

## 2021-04-21 MED ORDER — GABAPENTIN 300 MG PO CAPS
300.0000 mg | ORAL_CAPSULE | Freq: Once | ORAL | Status: AC
  Administered 2021-04-22: 300 mg via ORAL
  Filled 2021-04-21: qty 1

## 2021-04-21 MED ORDER — HEPARIN (PORCINE) 25000 UT/250ML-% IV SOLN
900.0000 [IU]/h | INTRAVENOUS | Status: DC
  Administered 2021-04-22 – 2021-04-23 (×2): 900 [IU]/h via INTRAVENOUS
  Filled 2021-04-21 (×2): qty 250

## 2021-04-21 NOTE — Progress Notes (Addendum)
?[facility] ?Progress Note  ? ?Subjective:   seen in room eating full meal.  No new complaints.  Surgery scheduled for tomorrow.  HD later today.  ? ?Objective ?Vitals:  ? 04/20/21 1958 04/21/21 0009 04/21/21 0354 04/21/21 6213  ?BP: 94/75 93/75 106/71 93/71  ?Pulse: 68 84 66 66  ?Resp: 18 18 18 17   ?Temp: (!) 97.3 ?F (36.3 ?C) 98 ?F (36.7 ?C) 97.9 ?F (36.6 ?C)   ?TempSrc: Oral Oral Oral Axillary  ?SpO2: 100% 91% 100% 100%  ?Weight:      ?Height:      ? ?Physical Exam ?General appearance: fatigued, no distress, and chronically ill-appearing ?Resp: clear to auscultation bilaterally ?Cardio: regular rate and rhythm ?GI: soft, non-tender ?Extremities: s/p bilateral BKA's, LBKA dressing intact, LUE AVF +T/B ?  ? ?Additional Objective ?Labs: ?Basic Metabolic Panel: ?Recent Labs  ?Lab 04/20/21 ?1234 04/21/21 ?0110  ?NA 134* 134*  ?K 5.6* 4.6  ?CL 100 99  ?CO2 21* 20*  ?GLUCOSE 130* 99  ?BUN 33* 34*  ?CREATININE 6.13* 6.02*  ?CALCIUM 8.2* 8.1*  ? ?Liver Function Tests: ?Recent Labs  ?Lab 04/20/21 ?1234  ?AST 24  ?ALT 17  ?ALKPHOS 106  ?BILITOT 1.6*  ?PROT 5.6*  ?ALBUMIN 2.1*  ? ?No results for input(s): LIPASE, AMYLASE in the last 168 hours. ?CBC: ?Recent Labs  ?Lab 04/20/21 ?1234 04/21/21 ?0110  ?WBC 9.7 10.1  ?NEUTROABS 8.2*  --   ?HGB 11.6* 12.4*  ?HCT 36.4* 36.4*  ?MCV 104.9* 103.1*  ?PLT 165 127*  ? ?Blood Culture ?   ?Component Value Date/Time  ? SDES BLOOD RIGHT FOREARM 02/12/2021 1055  ? SPECREQUEST  02/12/2021 1055  ?  BOTTLES DRAWN AEROBIC AND ANAEROBIC Blood Culture adequate volume  ? CULT  02/12/2021 1055  ?  NO GROWTH 5 DAYS ?Performed at [facility], [address], [place], [place] [address] ?  ? REPTSTATUS 02/17/2021 FINAL 02/12/2021 1055  ? ? ?Cardiac Enzymes: ?No results for input(s): CKTOTAL, CKMB, CKMBINDEX, TROPONINI in the last 168 hours. ?CBG: ?Recent Labs  ?Lab 04/20/21 ?1745 04/20/21 ?2000 04/21/21 ?0735  ?GLUCAP 111* 123* 86  ? ?Iron Studies: No results for input(s): IRON, TIBC,  TRANSFERRIN, FERRITIN in the last 72 hours. ?@lablastinr3 @ ?Studies/Results: ?DG Chest 1 View ? ?Result Date: 04/20/2021 ?CLINICAL DATA:  51 year old male presents for evaluation of foul-smelling drainage from a wound post BKA. EXAM: CHEST  1 VIEW COMPARISON:  April 09, 2021 FINDINGS: EKG leads project over the chest. Trachea is midline. Cardiomediastinal contours remain enlarged and extension weighted by portable technique and patient body habitus and AP projection. No lobar consolidation. Mild blunting of LEFT costodiaphragmatic sulcus and suggestion of mild elevation of the lateral LEFT hemidiaphragm. No pneumothorax. On limited assessment there is no acute skeletal finding. IMPRESSION: Small LEFT-sided effusion. Cardiomegaly. Electronically Signed   By: Zetta Bills M.D.   On: 04/20/2021 14:03   ?Medications: ? ceFEPime (MAXIPIME) IV    ? heparin 1,100 Units/hr (04/21/21 0437)  ? metronidazole 500 mg (04/21/21 0443)  ? vancomycin    ? ? amiodarone  200 mg Oral Daily  ? aspirin  81 mg Oral Daily  ? atorvastatin  10 mg Oral Q M,W,F  ? calcium acetate  667 mg Oral TID WC  ? cholecalciferol  1,000 Units Oral Q M,W,F  ? insulin aspart  0-6 Units Subcutaneous TID WC  ? pantoprazole  40 mg Oral Daily  ? sodium zirconium cyclosilicate  10 g Oral Once  ? ? ?Dialysis Orders:  Center: [facility]  on MWF ? 4h 71min  95 kg   1K/2.25 bath  Hep 1000+ 500u/hr  LUE AVG  300/500   ?- prosthetic weighs 1.6 kg ? - Epogen 1000   Units IV/HD   ?  ?Assessment/Plan ?**stump infection:  broad spectrum antibiotics.  Plan OR Thursday.  ?  ?**ESRD on HD:  HD today per usual schedule MWF - plan UF 2.5L, 2K/2.5Ca, heparin per outpt held due to systemic heparin gtt. ?  ?**Hyperkalemia:  Mild, resolved with lokelma. 2K dialysate today. ?  ?**Anemia:  Hb in 11s, follow.  On low dose epogen outpt.  ?  ?**Nutrition: supplements ?  ?**HL: statin   ? ?**PAF: eliquis on hold for OR.  Rate controlled. ? ?Jannifer Hick MD ?04/21/2021, 10:38 AM   ?[facility] ?Pager: [phone] ? ? ?

## 2021-04-21 NOTE — Progress Notes (Signed)
I informed primary nurse not to restart heparin because there's a leaking of blood at the needle site when I cannulated the patient. First needle was removed and CCHT was recalculated another site. I called also the pharmacy and informed what happen. ?

## 2021-04-21 NOTE — Progress Notes (Signed)
?PROGRESS NOTE ? ?[name]  ?DOB: 03-21-1970  ?PCP: Monico Blitz, MD ?DJS:[id number]  ?DOA: 04/20/2021 ? LOS: 1 day  ?Hospital Day: 2 ? ?Brief narrative: ?DOMANIQUE LUCKETT is a 51 y.o. 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 ?Patient presented to the ED on 3/14 with worsening left BKA stump infection. ? ?Patient underwent left leg BKA on 02/16/2021.  In February, he started to complain of progressive pain at the left BKA stump site.  Staples were removed.  His pain continues to increase and he also started having foul-smelling discharge.  ?On 3/14, he was seen by vascular surgery in the office.  There was concern of deep tissue infection and hence patient was sent to the ED.   ?Of note, patient also missed his scheduled dialysis on Monday 3/13 because of inability to move with severe BKA stump pain. ? ?In the ED, patient was afebrile, hemodynamically stable ?Labs with WC count 9.7, lactic acid 1.7 ?  ?Subjective: ?Patient was seen and examined this morning.  Middle-aged Caucasian male.  Chronically sick looking. ?Pain control partially. ? ?Principal Problem: ?  Osteomyelitis ([place]) ?Active Problems: ?  CHF (congestive heart failure) ([place]) ?  Osteomyelitis of ankle or foot, acute, left ([place]) ?  ?Assessment and Plan: ?Left BKA stump infected ulcer ?-Underwent left BKA in January 2023 ?-Progressively worsening infection since then associated with pain, foul-smelling discharge ?-Vascular surgery plans for AKA tomorrow 3/16. ?-Currently covered on broad-spectrum IV antibiotics ?-Pain control with oxycodone and Dilaudid as needed. ?-Eliquis held.  Currently on heparin drip ? ?ESRD HD MWF ?-Nephrology consult appreciated. ?-HD today per usual schedule. ? ?Hyperkalemia ?-Secondary to missing dialysis on Monday.   ?-1 dose of Lokelma was given in the ED.   ? ?Chronic combined systolic  and diastolic CHF  ?essential hypertension ?-Clinically euvolemic.   ?-Chest x-ray did not show acute infiltrates or signs of fluid overload.   ?-I did not see any CHF/hypertension meds in his list. ? ?Paroxysmal A-fib ?-Continue amiodarone ?-Eliquis on hold.  Currently on heparin drip ?  ?IDDM ?-Renal dose sliding scale for now. ?  ?Peripheral artery disease ?-Status post prior bilateral BKA. ?-PTA, patient was on aspirin, Eliquis, Lipitor.  ?-Eliquis and aspirin on hold.  Currently on heparin drip.  Continue statin ?  ?Anemia secondary to CKD ?-H&H stable.  Continue systemic anticoagulation. ?-Continue to monitor ?Recent Labs  ?  07/20/20 ?0248 07/21/20 ?0532 07/21/20 ?8850 07/28/20 ?2774 08/28/20 ?1131 02/04/21 ?0727 02/05/21 ?0518 02/15/21 ?1287 02/15/21 ?8676 03/26/21 ?2315 04/09/21 ?1442 04/09/21 ?2038 04/20/21 ?1234 04/21/21 ?0110  ?HGB 10.4*  --    < >  --    < >  --    < > 9.5*   < > 10.4* 10.7* 9.5* 11.6* 12.4*  ?MCV 106.2*  --    < >  --    < >  --    < > 104.7*   < > 104.2* 107.4* 105.3* 104.9* 103.1*  ?HMCNOBSJ62  --   --   --  450  --  757  --   --   --   --   --   --   --   --   ?FOLATE  --   --   --  10.9  --  10.3  --   --   --   --   --   --   --   --   ?  FERRITIN 1,303* 1,848*  --   --   --   --   --  860*  --   --   --   --   --   --   ?TIBC  --   --   --   --   --   --   --  192*  --   --   --   --   --   --   ?IRON  --   --   --   --   --   --   --  41*  --   --   --   --   --   --   ? < > = values in this interval not displayed.  ? ? ?Goals of care ?  Code Status: Full Code  ? ? ?Mobility: Bilateral BKA status ? ?Nutritional status:  ?Body mass index is 55.42 kg/m?.  ?  ?  ? ? ? ? ?Diet:  ?Diet Order   ? ?       ?  Diet NPO time specified  Diet effective midnight       ?  ?  Diet renal/carb modified with fluid restriction Diet-HS Snack? Nothing; Fluid restriction: 1200 mL Fluid; Room service appropriate? Yes; Fluid consistency: Thin  Diet effective now       ?  ? ?  ?  ? ?  ? ? ?DVT  prophylaxis: Heparin drip ? ?  ?Antimicrobials: Cefepime Flagyl vancomycin ?Fluid: None ?Consultants: Nephrology, vascular surgery ?Family Communication: None at bedside ? ?Status is: Inpatient ? ?Continue in-hospital care because: Pending left AKA tomorrow ?Level of care: Telemetry Medical  ? ?Dispo: The patient is from: Home ?             Anticipated d/c is to: Pending clinical course ?             Patient currently is not medically stable to d/c. ?  Difficult to place patient No ? ? ? ? ?Infusions:  ? ceFEPime (MAXIPIME) IV    ? heparin 1,100 Units/hr (04/21/21 0437)  ? metronidazole 500 mg (04/21/21 0443)  ? vancomycin    ? ? ?Scheduled Meds: ? amiodarone  200 mg Oral Daily  ? atorvastatin  10 mg Oral Q M,W,F  ? calcium acetate  667 mg Oral TID WC  ? cholecalciferol  1,000 Units Oral Q M,W,F  ? insulin aspart  0-6 Units Subcutaneous TID WC  ? pantoprazole  40 mg Oral Daily  ? sodium zirconium cyclosilicate  10 g Oral Once  ? ? ?PRN meds: ?acetaminophen, albuterol, bisacodyl, butalbital-acetaminophen-caffeine, diphenhydrAMINE **OR** [DISCONTINUED] diphenhydrAMINE, diphenhydrAMINE, hydrALAZINE, HYDROmorphone (DILAUDID) injection, ondansetron **OR** ondansetron (ZOFRAN) IV, oxyCODONE, senna-docusate, tiZANidine  ? ?Antimicrobials: ?Anti-infectives (From admission, onward)  ? ? Start     Dose/Rate Route Frequency Ordered Stop  ? 04/21/21 1400  ceFEPIme (MAXIPIME) 1 g in sodium chloride 0.9 % 100 mL IVPB       ? 1 g ?200 mL/hr over 30 Minutes Intravenous Every 24 hours 04/20/21 1322    ? 04/21/21 1200  vancomycin (VANCOCIN) IVPB 1000 mg/200 mL premix       ? 1,000 mg ?200 mL/hr over 60 Minutes Intravenous Every M-W-F (Hemodialysis) 04/20/21 1322    ? 04/20/21 1330  metroNIDAZOLE (FLAGYL) IVPB 500 mg       ? 500 mg ?100 mL/hr over 60 Minutes Intravenous Every 12 hours 04/20/21 1317    ? 04/20/21 1330  vancomycin (  VANCOREADY) IVPB 2000 mg/400 mL       ? 2,000 mg ?200 mL/hr over 120 Minutes Intravenous  Once 04/20/21  1319 04/20/21 1430  ? 04/20/21 1330  ceFEPIme (MAXIPIME) 2 g in sodium chloride 0.9 % 100 mL IVPB       ? 2 g ?200 mL/hr over 30 Minutes Intravenous  Once 04/20/21 1319 04/20/21 1435  ? ?  ? ? ?Objective: ?Vitals:  ? 04/21/21 0354 04/21/21 0738  ?BP: 106/71 93/71  ?Pulse: 66 66  ?Resp: 18 17  ?Temp: 97.9 ?F (36.6 ?C)   ?SpO2: 100% 100%  ? ? ?Intake/Output Summary (Last 24 hours) at 04/21/2021 1309 ?Last data filed at 04/21/2021 1200 ?Gross per 24 hour  ?Intake 1453.97 ml  ?Output 0 ml  ?Net 1453.97 ml  ? ?Filed Weights  ? 04/20/21 1427  ?Weight: 93 kg  ? ?Weight change:  ?Body mass index is 55.42 kg/m?.  ? ?Physical Exam: ?General exam: Pleasant, middle-aged Caucasian male.  Chronically sick looking.  Not in acute distress ?Skin: No rashes, lesions or ulcers. ?HEENT: Atraumatic, normocephalic, no obvious bleeding ?Lungs: Clear to auscultation bilaterally ?CVS: Regular rate and rhythm, no murmur ?GI/Abd soft, nontender, nondistended, bowel sound present ?CNS: Alert, awake, oriented to place and person ?Psychiatry: Mood appropriate ?Extremities: Bilateral BKA status.  Bandages on his stumps ? ?Data Review: I have personally reviewed the laboratory data and studies available. ? ?F/u labs ordered ?Unresulted Labs (From admission, onward)  ? ?  Start     Ordered  ? 04/22/21 0500  Heparin level (unfractionated)  Daily,   R     ? 04/20/21 1503  ? 04/22/21 0500  APTT  Daily,   R     ? 04/20/21 1503  ? 04/22/21 0500  Renal function panel  Tomorrow morning,   R       ? 04/21/21 1043  ? 04/21/21 1254  APTT  ONCE - STAT,   STAT       ? 04/21/21 1253  ? 04/21/21 1254  Heparin level (unfractionated)  ONCE - STAT,   STAT       ? 04/21/21 1253  ? 04/20/21 1317  Blood culture (routine x 2)  BLOOD CULTURE X 2,   STAT     ? 04/20/21 1316  ? Unscheduled  CBC with Differential/Platelet  Daily,   R     ? 04/21/21 1309  ? Unscheduled  Basic metabolic panel  Daily,   R     ? 04/21/21 1309  ? Signed and Held  CBC  Once,   R       ? Signed and  Held  ? Signed and Held  Hepatitis B surface antigen  (New Admission Hemo Labs (Hepatitis B))  Once,   R       ? Signed and Held  ? Signed and Held  Hepatitis B surface antibody  (New Admission Hemo Labs (Hepatiti

## 2021-04-21 NOTE — Plan of Care (Signed)
  Problem: Education: Goal: Knowledge of General Education information will improve Description: Including pain rating scale, medication(s)/side effects and non-pharmacologic comfort measures Outcome: Progressing   Problem: Clinical Measurements: Goal: Ability to maintain clinical measurements within normal limits will improve Outcome: Progressing   

## 2021-04-21 NOTE — Progress Notes (Signed)
ANTICOAGULATION CONSULT NOTE  ? ?Pharmacy Consult for heparin ?Indication: atrial fibrillation ? ?Allergies  ?Allergen Reactions  ? Tape Other (See Comments)  ?  Pulls off the skin  ? Chlorhexidine Gluconate [Chlorhexidine] Rash  ? ? ?Patient Measurements: ?Height: 4\' 3"  (129.5 cm) ?Weight: 95 kg (209 lb 7 oz) ?IBW/kg (Calculated) : 29.3 ?Heparin Dosing Weight: 93 kg due to non-obese when using pre-amputation height of 6' ? ?Vital Signs: ?Temp: 97.8 ?F (36.6 ?C) (03/15 1627) ?Temp Source: Temporal (03/15 1627) ?BP: 77/42 (03/15 1930) ?Pulse Rate: 69 (03/15 1930) ? ?Labs: ?Recent Labs  ?  04/20/21 ?1234 04/20/21 ?1657 04/21/21 ?0110 04/21/21 ?1316  ?HGB 11.6*  --  12.4*  --   ?HCT 36.4*  --  36.4*  --   ?PLT 165  --  127*  --   ?APTT  --  42* >200* 182*  ?HEPARINUNFRC  --  0.67 >1.10* 0.91*  ?CREATININE 6.13*  --  6.02*  --   ? ? ? ?Estimated Creatinine Clearance: 11.5 mL/min (A) (by C-G formula based on SCr of 6.02 mg/dL (H)). ? ?Medications:  ?Patient taking apixaban 5 mg BID PTA ?Patient reported the last dose of apixaban was 3/13 in the evening ? ?Assessment: ?51  y.o. M on apixaban PTA for afib. Plan for surgery 3/16 so holding apixaban and bridging with heparin. aPTT 182 sec, heparin level 0.91 (may be correlating at this point but very hard to tell). Labs appear to be drawn appropriately. No bleeding noted. ? ?RN reported that heparin has been off since 15:00. Was supposed to hold for 1 hour but patient when to dialysis and was never restarted. Asked RN to restart at 900 units/hr.  ? ?Goal of Therapy:  ?Heparin level 0.3-0.7 units/mL ?aPTT 66-102 secs ?Monitor platelets by anticoagulation protocol: Yes ?  ?Plan:   ?Re-start heparin at 900 units/hr ?Monitor daily heparin level, CBC ?Monitor for signs/symptoms of bleeding  ? ?Benetta Spar, PharmD, BCPS, BCCP ?Clinical Pharmacist ? ?Please check AMION for all [place] phone numbers ?After 10:00 PM, call [place] [phone] ? ? ? ? ? ?

## 2021-04-21 NOTE — Progress Notes (Signed)
?  Progress Note ? ? ? ?04/21/2021 ?9:29 AM ?* No surgery date entered * ? ?Subjective:  no complaints ? ? ?Vitals:  ? 04/21/21 0354 04/21/21 0738  ?BP: 106/71 93/71  ?Pulse: 66 66  ?Resp: 18 17  ?Temp: 97.9 ?F (36.6 ?C)   ?SpO2: 100% 100%  ? ?Physical Exam ?Lungs:  non labored ?Incisions:  bilateral BKA non healing ?Neurologic: a&O ? ?CBC ?   ?Component Value Date/Time  ? WBC 10.1 04/21/2021 0110  ? RBC 3.53 (L) 04/21/2021 0110  ? HGB 12.4 (L) 04/21/2021 0110  ? HGB 10.4 (L) 06/02/2016 1107  ? HCT 36.4 (L) 04/21/2021 0110  ? HCT 32.9 (L) 06/02/2016 1107  ? PLT 127 (L) 04/21/2021 0110  ? PLT 389 (H) 06/02/2016 1107  ? MCV 103.1 (H) 04/21/2021 0110  ? MCV 94 06/02/2016 1107  ? MCH 35.1 (H) 04/21/2021 0110  ? MCHC 34.1 04/21/2021 0110  ? RDW 18.6 (H) 04/21/2021 0110  ? RDW 16.0 (H) 06/02/2016 1107  ? LYMPHSABS 0.8 04/20/2021 1234  ? LYMPHSABS 2.9 06/02/2016 1107  ? MONOABS 0.3 04/20/2021 1234  ? EOSABS 0.3 04/20/2021 1234  ? EOSABS 0.5 (H) 06/02/2016 1107  ? BASOSABS 0.1 04/20/2021 1234  ? BASOSABS 0.1 06/02/2016 1107  ? ? ?BMET ?   ?Component Value Date/Time  ? NA 134 (L) 04/21/2021 0110  ? NA 142 12/16/2015 1051  ? K 4.6 04/21/2021 0110  ? CL 99 04/21/2021 0110  ? CO2 20 (L) 04/21/2021 0110  ? GLUCOSE 99 04/21/2021 0110  ? BUN 34 (H) 04/21/2021 0110  ? BUN 45 (H) 12/16/2015 1051  ? CREATININE 6.02 (H) 04/21/2021 0110  ? CALCIUM 8.1 (L) 04/21/2021 0110  ? GFRNONAA 11 (L) 04/21/2021 0110  ? GFRAA 9 (L) 09/27/2019 0951  ? ? ?INR ?   ?Component Value Date/Time  ? INR 1.4 (H) 02/13/2021 0304  ? ? ? ?Intake/Output Summary (Last 24 hours) at 04/21/2021 0929 ?Last data filed at 04/21/2021 0500 ?Gross per 24 hour  ?Intake 1113.97 ml  ?Output 0 ml  ?Net 1113.97 ml  ? ? ? ? ?Assessment/Plan:  51 y.o. male with non healing BKA wounds ? ?Plan is for left above the knee amputation and right bka incision debridement by Dr. Virl Cagey tomorrow 04/22/21 ?Continue broad spectrum IV antibiotics for now ?Npo past midnight ?Consent  ordered ? ? ?Dagoberto Ligas, PA-C ?[facility] ?[phone] ?04/21/2021 ?9:29 AM ? ?VASCULAR STAFF ADDENDUM: ?I have independently interviewed and examined the patient. ?I agree with the above.  ? ? ?[name], MD ?[facility] ?Office Phone Number: [phone] ?04/21/2021 9:29 AM ? ? ? ?

## 2021-04-21 NOTE — Progress Notes (Signed)
Talked with Derald Macleod, dialysis nurse, via phone about patient's heparin order. Pharmacy instructed me to turn the infusion off for 1 hour and restart at 63ml/hr. I stopped infusion at 1530 and it needs to be restarted at 1630. Presently patient is off the floor in dialysis. ?

## 2021-04-21 NOTE — Progress Notes (Signed)
ANTICOAGULATION CONSULT NOTE  ? ?Pharmacy Consult for heparin ?Indication: atrial fibrillation ? ?Allergies  ?Allergen Reactions  ? Tape Other (See Comments)  ?  Pulls skin off  ? Chlorhexidine Gluconate [Chlorhexidine] Rash  ? ? ?Patient Measurements: ?Height: 4\' 3"  (129.5 cm) ?Weight: 93 kg (205 lb 0.4 oz) ?IBW/kg (Calculated) : 29.3 ?Heparin Dosing Weight: 93 kg due to non-obese when using pre-amputation height of 6' ? ?Vital Signs: ?Temp: 98 ?F (36.7 ?C) (03/15 0009) ?Temp Source: Oral (03/15 0009) ?BP: 93/75 (03/15 0009) ?Pulse Rate: 84 (03/15 0009) ? ?Labs: ?Recent Labs  ?  04/20/21 ?1234 04/20/21 ?1657 04/21/21 ?0110  ?HGB 11.6*  --  12.4*  ?HCT 36.4*  --  36.4*  ?PLT 165  --  127*  ?APTT  --  42* >200*  ?HEPARINUNFRC  --  0.67 >1.10*  ?CREATININE 6.13*  --  6.02*  ? ? ? ?Estimated Creatinine Clearance: 11.4 mL/min (A) (by C-G formula based on SCr of 6.02 mg/dL (H)). ? ? ?Medical History: ?Past Medical History:  ?Diagnosis Date  ? Anemia   ? Anxiety   ? Blood transfusion without reported diagnosis   ? CHF (congestive heart failure) ([place])   ? a. EF 25% by echo in 07/2019  ? Chronic kidney disease   ? Depression   ? Diabetes mellitus without complication ([place])   ? End stage renal disease ([place])   ? M/W/F [name]  ? Hyperlipidemia   ? Neuropathy   ? Peripheral vascular disease ([place])   ? PTSD (post-traumatic stress disorder)   ? ? ?Medications:  ?Patient taking apixaban 5 mg BID PTA ?Patient reported the last dose of apixaban was 3/14 in the evening ? ?Assessment: ?Last dose of apixaban was >12 hours ago per patient report. Will start heparin now without bolus per protocol. CBC stable. ? ?3/15 AM update:  ?aPTT elevated ? ?Goal of Therapy:  ?Heparin level 0.3-0.7 units/mL ?aPTT 66-102 secs ?Monitor platelets by anticoagulation protocol: Yes ?  ?Plan:  ?Hold heparin x 1 hr ?Re-start heparin at 1100 units/hr at 0400 ?1200 heparin level and aPTT ? ?Narda Bonds, PharmD, BCPS ?Clinical Pharmacist ?Phone:  [phone] ? ? ? ? ?

## 2021-04-21 NOTE — Progress Notes (Signed)
?  Progress Note ? ? ? ?04/21/2021 ?8:48 AM ?* No surgery date entered * ? ?Subjective:  no complaints ? ? ?Vitals:  ? 04/21/21 0354 04/21/21 0738  ?BP: 106/71 93/71  ?Pulse: 66 66  ?Resp: 18 17  ?Temp: 97.9 ?F (36.6 ?C)   ?SpO2: 100% 100%  ? ?Physical Exam ?Lungs:  non labored ?Incisions:  bilateral BKA non healing ?Neurologic: a&O ? ?CBC ?   ?Component Value Date/Time  ? WBC 10.1 04/21/2021 0110  ? RBC 3.53 (L) 04/21/2021 0110  ? HGB 12.4 (L) 04/21/2021 0110  ? HGB 10.4 (L) 06/02/2016 1107  ? HCT 36.4 (L) 04/21/2021 0110  ? HCT 32.9 (L) 06/02/2016 1107  ? PLT 127 (L) 04/21/2021 0110  ? PLT 389 (H) 06/02/2016 1107  ? MCV 103.1 (H) 04/21/2021 0110  ? MCV 94 06/02/2016 1107  ? MCH 35.1 (H) 04/21/2021 0110  ? MCHC 34.1 04/21/2021 0110  ? RDW 18.6 (H) 04/21/2021 0110  ? RDW 16.0 (H) 06/02/2016 1107  ? LYMPHSABS 0.8 04/20/2021 1234  ? LYMPHSABS 2.9 06/02/2016 1107  ? MONOABS 0.3 04/20/2021 1234  ? EOSABS 0.3 04/20/2021 1234  ? EOSABS 0.5 (H) 06/02/2016 1107  ? BASOSABS 0.1 04/20/2021 1234  ? BASOSABS 0.1 06/02/2016 1107  ? ? ?BMET ?   ?Component Value Date/Time  ? NA 134 (L) 04/21/2021 0110  ? NA 142 12/16/2015 1051  ? K 4.6 04/21/2021 0110  ? CL 99 04/21/2021 0110  ? CO2 20 (L) 04/21/2021 0110  ? GLUCOSE 99 04/21/2021 0110  ? BUN 34 (H) 04/21/2021 0110  ? BUN 45 (H) 12/16/2015 1051  ? CREATININE 6.02 (H) 04/21/2021 0110  ? CALCIUM 8.1 (L) 04/21/2021 0110  ? GFRNONAA 11 (L) 04/21/2021 0110  ? GFRAA 9 (L) 09/27/2019 0951  ? ? ?INR ?   ?Component Value Date/Time  ? INR 1.4 (H) 02/13/2021 0304  ? ? ? ?Intake/Output Summary (Last 24 hours) at 04/21/2021 0848 ?Last data filed at 04/21/2021 0500 ?Gross per 24 hour  ?Intake 1113.97 ml  ?Output 0 ml  ?Net 1113.97 ml  ? ? ? ?Assessment/Plan:  51 y.o. male with non healing BKA wounds ? ?Plan is for left above the knee amputation and right bka incision debridement by Dr. Virl Cagey tomorrow 04/22/21 ?Continue broad spectrum IV antibiotics for now ?Npo past midnight ?Consent  ordered ? ? ?Dagoberto Ligas, PA-C ?[facility] ?[phone] ?04/21/2021 ?8:48 AM ? ? ? ?

## 2021-04-21 NOTE — Progress Notes (Signed)
ANTICOAGULATION CONSULT NOTE  ? ?Pharmacy Consult for heparin ?Indication: atrial fibrillation ? ?Allergies  ?Allergen Reactions  ? Tape Other (See Comments)  ?  Pulls skin off  ? Chlorhexidine Gluconate [Chlorhexidine] Rash  ? ? ?Patient Measurements: ?Height: 4\' 3"  (129.5 cm) ?Weight: 93 kg (205 lb 0.4 oz) ?IBW/kg (Calculated) : 29.3 ?Heparin Dosing Weight: 93 kg due to non-obese when using pre-amputation height of 6' ? ?Vital Signs: ?Temp: 97.9 ?F (36.6 ?C) (03/15 0354) ?Temp Source: Axillary (03/15 9485) ?BP: 93/71 (03/15 4627) ?Pulse Rate: 66 (03/15 0738) ? ?Labs: ?Recent Labs  ?  04/20/21 ?1234 04/20/21 ?1657 04/21/21 ?0110 04/21/21 ?1316  ?HGB 11.6*  --  12.4*  --   ?HCT 36.4*  --  36.4*  --   ?PLT 165  --  127*  --   ?APTT  --  42* >200* 182*  ?HEPARINUNFRC  --  0.67 >1.10* 0.91*  ?CREATININE 6.13*  --  6.02*  --   ? ? ? ?Estimated Creatinine Clearance: 11.4 mL/min (A) (by C-G formula based on SCr of 6.02 mg/dL (H)). ? ? ? ? ?Medications:  ?Patient taking apixaban 5 mg BID PTA ?Patient reported the last dose of apixaban was 3/13 in the evening ? ?Assessment: ?51  y.o. M on apixaban PTA for afib. Plan for surgery 3/16 so holding apixaban and bridging with heparin. aPTT 182 sec, heparin level 0.91 (may be correlating at this point but very hard to tell). Labs appear to be drawn appropriately. No bleeding noted. ? ?Goal of Therapy:  ?Heparin level 0.3-0.7 units/mL ?aPTT 66-102 secs ?Monitor platelets by anticoagulation protocol: Yes ?  ?Plan:  ?Hold heparin x 1 hr ?Re-start heparin at 900 units/hr ?8 hr heparin level and aPTT ? ?Sherlon Handing, PharmD, BCPS ?Please see amion for complete clinical pharmacist phone list ?04/21/2021 3:13 PM ? ? ? ? ?

## 2021-04-22 ENCOUNTER — Other Ambulatory Visit: Payer: Self-pay

## 2021-04-22 ENCOUNTER — Inpatient Hospital Stay (HOSPITAL_COMMUNITY): Payer: Medicaid Other | Admitting: Anesthesiology

## 2021-04-22 ENCOUNTER — Encounter (HOSPITAL_COMMUNITY)
Admission: EM | Disposition: A | Discharge status: Skilled Nursing Facility | Payer: Self-pay | Source: Home / Self Care | Attending: Internal Medicine

## 2021-04-22 ENCOUNTER — Encounter (HOSPITAL_COMMUNITY): Payer: Self-pay | Admitting: Internal Medicine

## 2021-04-22 DIAGNOSIS — T8744 Infection of amputation stump, left lower extremity: Principal | ICD-10-CM

## 2021-04-22 DIAGNOSIS — E1151 Type 2 diabetes mellitus with diabetic peripheral angiopathy without gangrene: Secondary | ICD-10-CM

## 2021-04-22 DIAGNOSIS — T8743 Infection of amputation stump, right lower extremity: Secondary | ICD-10-CM

## 2021-04-22 DIAGNOSIS — N186 End stage renal disease: Secondary | ICD-10-CM

## 2021-04-22 DIAGNOSIS — I9581 Postprocedural hypotension: Secondary | ICD-10-CM

## 2021-04-22 DIAGNOSIS — T8789 Other complications of amputation stump: Secondary | ICD-10-CM

## 2021-04-22 DIAGNOSIS — I5040 Unspecified combined systolic (congestive) and diastolic (congestive) heart failure: Secondary | ICD-10-CM

## 2021-04-22 DIAGNOSIS — D649 Anemia, unspecified: Secondary | ICD-10-CM

## 2021-04-22 DIAGNOSIS — I959 Hypotension, unspecified: Secondary | ICD-10-CM

## 2021-04-22 DIAGNOSIS — I4891 Unspecified atrial fibrillation: Secondary | ICD-10-CM

## 2021-04-22 DIAGNOSIS — E1122 Type 2 diabetes mellitus with diabetic chronic kidney disease: Secondary | ICD-10-CM

## 2021-04-22 DIAGNOSIS — M86252 Subacute osteomyelitis, left femur: Secondary | ICD-10-CM | POA: Diagnosis not present

## 2021-04-22 DIAGNOSIS — Z91199 Patient's noncompliance with other medical treatment and regimen due to unspecified reason: Secondary | ICD-10-CM

## 2021-04-22 HISTORY — PX: WOUND DEBRIDEMENT: SHX247

## 2021-04-22 HISTORY — PX: AMPUTATION: SHX166

## 2021-04-22 LAB — CBC WITH DIFFERENTIAL/PLATELET
Abs Immature Granulocytes: 0.06 10*3/uL (ref 0.00–0.07)
Basophils Absolute: 0.1 10*3/uL (ref 0.0–0.1)
Basophils Relative: 1 %
Eosinophils Absolute: 0.7 10*3/uL — ABNORMAL HIGH (ref 0.0–0.5)
Eosinophils Relative: 7 %
HCT: 34.9 % — ABNORMAL LOW (ref 39.0–52.0)
Hemoglobin: 11.7 g/dL — ABNORMAL LOW (ref 13.0–17.0)
Immature Granulocytes: 1 %
Lymphocytes Relative: 9 %
Lymphs Abs: 0.9 10*3/uL (ref 0.7–4.0)
MCH: 34.3 pg — ABNORMAL HIGH (ref 26.0–34.0)
MCHC: 33.5 g/dL (ref 30.0–36.0)
MCV: 102.3 fL — ABNORMAL HIGH (ref 80.0–100.0)
Monocytes Absolute: 0.4 10*3/uL (ref 0.1–1.0)
Monocytes Relative: 3 %
Neutro Abs: 8.6 10*3/uL — ABNORMAL HIGH (ref 1.7–7.7)
Neutrophils Relative %: 79 %
Platelets: 125 10*3/uL — ABNORMAL LOW (ref 150–400)
RBC: 3.41 MIL/uL — ABNORMAL LOW (ref 4.22–5.81)
RDW: 18.3 % — ABNORMAL HIGH (ref 11.5–15.5)
WBC: 10.7 10*3/uL — ABNORMAL HIGH (ref 4.0–10.5)
nRBC: 0.4 % — ABNORMAL HIGH (ref 0.0–0.2)

## 2021-04-22 LAB — RENAL FUNCTION PANEL
Albumin: 2.1 g/dL — ABNORMAL LOW (ref 3.5–5.0)
Albumin: 2.2 g/dL — ABNORMAL LOW (ref 3.5–5.0)
Anion gap: 14 (ref 5–15)
Anion gap: 14 (ref 5–15)
BUN: 21 mg/dL — ABNORMAL HIGH (ref 6–20)
BUN: 23 mg/dL — ABNORMAL HIGH (ref 6–20)
CO2: 22 mmol/L (ref 22–32)
CO2: 21 mmol/L — ABNORMAL LOW (ref 22–32)
Calcium: 7.8 mg/dL — ABNORMAL LOW (ref 8.9–10.3)
Calcium: 8.4 mg/dL — ABNORMAL LOW (ref 8.9–10.3)
Chloride: 97 mmol/L — ABNORMAL LOW (ref 98–111)
Chloride: 97 mmol/L — ABNORMAL LOW (ref 98–111)
Creatinine, Ser: 4.28 mg/dL — ABNORMAL HIGH (ref 0.61–1.24)
Creatinine, Ser: 4.81 mg/dL — ABNORMAL HIGH (ref 0.61–1.24)
GFR, Estimated: 16 mL/min — ABNORMAL LOW (ref >60)
GFR, Estimated: 14 mL/min — ABNORMAL LOW (ref >60)
Glucose, Bld: 67 mg/dL — ABNORMAL LOW (ref 70–99)
Glucose, Bld: 106 mg/dL — ABNORMAL HIGH (ref 70–99)
Phosphorus: 4.8 mg/dL — ABNORMAL HIGH (ref 2.5–4.6)
Phosphorus: 5.8 mg/dL — ABNORMAL HIGH (ref 2.5–4.6)
Potassium: 3.9 mmol/L (ref 3.5–5.1)
Potassium: 3.8 mmol/L (ref 3.5–5.1)
Sodium: 133 mmol/L — ABNORMAL LOW (ref 135–145)
Sodium: 132 mmol/L — ABNORMAL LOW (ref 135–145)

## 2021-04-22 LAB — CBC
HCT: 30.7 % — ABNORMAL LOW (ref 39.0–52.0)
Hemoglobin: 10.2 g/dL — ABNORMAL LOW (ref 13.0–17.0)
MCH: 34.1 pg — ABNORMAL HIGH (ref 26.0–34.0)
MCHC: 33.2 g/dL (ref 30.0–36.0)
MCV: 102.7 fL — ABNORMAL HIGH (ref 80.0–100.0)
Platelets: 142 10*3/uL — ABNORMAL LOW (ref 150–400)
RBC: 2.99 MIL/uL — ABNORMAL LOW (ref 4.22–5.81)
RDW: 18.3 % — ABNORMAL HIGH (ref 11.5–15.5)
WBC: 10.2 10*3/uL (ref 4.0–10.5)
nRBC: 0 % (ref 0.0–0.2)

## 2021-04-22 LAB — GLUCOSE, CAPILLARY
Glucose-Capillary: 67 mg/dL — ABNORMAL LOW (ref 70–99)
Glucose-Capillary: 132 mg/dL — ABNORMAL HIGH (ref 70–99)
Glucose-Capillary: 78 mg/dL (ref 70–99)
Glucose-Capillary: 76 mg/dL (ref 70–99)
Glucose-Capillary: 71 mg/dL (ref 70–99)
Glucose-Capillary: 110 mg/dL — ABNORMAL HIGH (ref 70–99)
Glucose-Capillary: 86 mg/dL (ref 70–99)
Glucose-Capillary: 127 mg/dL — ABNORMAL HIGH (ref 70–99)

## 2021-04-22 LAB — APTT
aPTT: 182 seconds (ref 24–36)
aPTT: 100 seconds — ABNORMAL HIGH (ref 24–36)

## 2021-04-22 LAB — HEPARIN LEVEL (UNFRACTIONATED): Heparin Unfractionated: 0.38 IU/mL (ref 0.30–0.70)

## 2021-04-22 LAB — MRSA NEXT GEN BY PCR, NASAL: MRSA by PCR Next Gen: DETECTED — AB

## 2021-04-22 SURGERY — AMPUTATION, ABOVE KNEE
Anesthesia: General | Site: Leg Lower | Laterality: Right

## 2021-04-22 MED ORDER — DEXTROSE 10 % IV SOLN: INTRAVENOUS | Status: DC

## 2021-04-22 MED ORDER — MUPIROCIN 2 % EX OINT
1.0000 "application " | TOPICAL_OINTMENT | Freq: Two times a day (BID) | CUTANEOUS | Status: AC
  Administered 2021-04-23 – 2021-04-27 (×9): 1 via NASAL
  Filled 2021-04-22 (×4): qty 22

## 2021-04-22 MED ORDER — ONDANSETRON HCL 4 MG/2ML IJ SOLN
INTRAMUSCULAR | Status: AC
  Filled 2021-04-22: qty 2

## 2021-04-22 MED ORDER — FENTANYL CITRATE (PF) 100 MCG/2ML IJ SOLN
INTRAMUSCULAR | Status: DC | PRN
  Administered 2021-04-22 (×3): 25 ug via INTRAVENOUS

## 2021-04-22 MED ORDER — VASOPRESSIN 20 UNIT/ML IV SOLN
INTRAVENOUS | Status: DC | PRN
  Administered 2021-04-22 (×4): 2 [IU] via INTRAVENOUS

## 2021-04-22 MED ORDER — PROPOFOL 10 MG/ML IV BOLUS
INTRAVENOUS | Status: DC | PRN
  Administered 2021-04-22: 30 mg via INTRAVENOUS

## 2021-04-22 MED ORDER — CHLORHEXIDINE GLUCONATE 0.12 % MT SOLN: 15.0000 mL | Freq: Once | OROMUCOSAL | Status: DC

## 2021-04-22 MED ORDER — VASOPRESSIN 20 UNIT/ML IV SOLN
INTRAVENOUS | Status: AC
  Filled 2021-04-22: qty 1

## 2021-04-22 MED ORDER — VANCOMYCIN HCL 500 MG IV SOLR
INTRAVENOUS | Status: AC
  Filled 2021-04-22: qty 10

## 2021-04-22 MED ORDER — DEXTROSE 50 % IV SOLN
INTRAVENOUS | Status: DC | PRN
  Administered 2021-04-22: 12.5 g via INTRAVENOUS

## 2021-04-22 MED ORDER — PHENYLEPHRINE 40 MCG/ML (10ML) SYRINGE FOR IV PUSH (FOR BLOOD PRESSURE SUPPORT)
PREFILLED_SYRINGE | INTRAVENOUS | Status: AC
  Filled 2021-04-22: qty 10

## 2021-04-22 MED ORDER — ONDANSETRON HCL 4 MG/2ML IJ SOLN
INTRAMUSCULAR | Status: DC | PRN
  Administered 2021-04-22: 4 mg via INTRAVENOUS

## 2021-04-22 MED ORDER — ALBUMIN HUMAN 5 % IV SOLN: INTRAVENOUS | Status: DC | PRN

## 2021-04-22 MED ORDER — SODIUM CHLORIDE 0.9 % IV SOLN
12.5000 mg | Freq: Three times a day (TID) | INTRAVENOUS | Status: DC | PRN
  Administered 2021-04-22: 12.5 mg via INTRAVENOUS
  Filled 2021-04-22 (×2): qty 0.5

## 2021-04-22 MED ORDER — CALCIUM CHLORIDE 10 % IV SOLN
INTRAVENOUS | Status: DC | PRN
  Administered 2021-04-22 (×2): 500 mg via INTRAVENOUS

## 2021-04-22 MED ORDER — PROPOFOL 10 MG/ML IV BOLUS
INTRAVENOUS | Status: AC
  Filled 2021-04-22: qty 20

## 2021-04-22 MED ORDER — FENTANYL CITRATE (PF) 100 MCG/2ML IJ SOLN: 25.0000 ug | INTRAMUSCULAR | Status: DC | PRN

## 2021-04-22 MED ORDER — EPINEPHRINE 1 MG/10ML IJ SOSY
PREFILLED_SYRINGE | INTRAMUSCULAR | Status: DC | PRN
Start: 2021-04-22 — End: 2021-04-22
  Administered 2021-04-22: 10 ug via INTRAVENOUS

## 2021-04-22 MED ORDER — SODIUM CHLORIDE 0.9 % IV SOLN
INTRAVENOUS | Status: DC | PRN
  Administered 2021-04-22: 500 mL

## 2021-04-22 MED ORDER — ORAL CARE MOUTH RINSE: 15.0000 mL | Freq: Once | OROMUCOSAL | Status: DC

## 2021-04-22 MED ORDER — INSULIN ASPART 100 UNIT/ML IJ SOLN: 0.0000 [IU] | INTRAMUSCULAR | Status: DC | PRN

## 2021-04-22 MED ORDER — FENTANYL CITRATE (PF) 250 MCG/5ML IJ SOLN
INTRAMUSCULAR | Status: AC
  Filled 2021-04-22: qty 5

## 2021-04-22 MED ORDER — ETOMIDATE 2 MG/ML IV SOLN
INTRAVENOUS | Status: DC | PRN
  Administered 2021-04-22 (×2): 10 mg via INTRAVENOUS

## 2021-04-22 MED ORDER — MORPHINE SULFATE (PF) 2 MG/ML IV SOLN
2.0000 mg | INTRAVENOUS | Status: DC | PRN
  Administered 2021-04-22 – 2021-04-23 (×3): 2 mg via INTRAVENOUS
  Filled 2021-04-22 (×3): qty 1

## 2021-04-22 MED ORDER — ONDANSETRON HCL 4 MG/2ML IJ SOLN: 4.0000 mg | Freq: Once | INTRAMUSCULAR | Status: DC | PRN

## 2021-04-22 MED ORDER — DEXTROSE 50 % IV SOLN
INTRAVENOUS | Status: AC
  Administered 2021-04-22: 50 mL
  Filled 2021-04-22: qty 50

## 2021-04-22 MED ORDER — LIDOCAINE 2% (20 MG/ML) 5 ML SYRINGE
INTRAMUSCULAR | Status: DC | PRN
  Administered 2021-04-22: 80 mg via INTRAVENOUS

## 2021-04-22 MED ORDER — DEXTROSE 50 % IV SOLN
INTRAVENOUS | Status: AC
  Filled 2021-04-22: qty 50

## 2021-04-22 MED ORDER — DEXAMETHASONE SODIUM PHOSPHATE 10 MG/ML IJ SOLN
INTRAMUSCULAR | Status: DC | PRN
  Administered 2021-04-22: 4 mg via INTRAVENOUS

## 2021-04-22 MED ORDER — MIDAZOLAM HCL 2 MG/2ML IJ SOLN
INTRAMUSCULAR | Status: AC
  Filled 2021-04-22: qty 2

## 2021-04-22 MED ORDER — PHENYLEPHRINE 40 MCG/ML (10ML) SYRINGE FOR IV PUSH (FOR BLOOD PRESSURE SUPPORT)
PREFILLED_SYRINGE | INTRAVENOUS | Status: DC | PRN
Start: 2021-04-22 — End: 2021-04-22
  Administered 2021-04-22: 160 ug via INTRAVENOUS
  Administered 2021-04-22 (×2): 120 ug via INTRAVENOUS

## 2021-04-22 MED ORDER — GENTAMICIN SULFATE 40 MG/ML IJ SOLN
INTRAMUSCULAR | Status: AC
  Filled 2021-04-22: qty 2

## 2021-04-22 MED ORDER — DEXTROSE IN LACTATED RINGERS 5 % IV SOLN: INTRAVENOUS | Status: DC

## 2021-04-22 MED ORDER — 0.9 % SODIUM CHLORIDE (POUR BTL) OPTIME
TOPICAL | Status: DC | PRN
  Administered 2021-04-22: 1000 mL

## 2021-04-22 MED ORDER — SODIUM CHLORIDE 0.9 % IV SOLN: INTRAVENOUS | Status: DC

## 2021-04-22 SURGICAL SUPPLY — 55 items
BAG COUNTER SPONGE SURGICOUNT (BAG) ×3 IMPLANT
BANDAGE ESMARK 6X9 LF (GAUZE/BANDAGES/DRESSINGS) ×2 IMPLANT
BLADE SAW SAG 73X25 THK (BLADE) ×1
BLADE SAW SGTL 73X25 THK (BLADE) ×2 IMPLANT
BNDG COHESIVE 6X5 TAN STRL LF (GAUZE/BANDAGES/DRESSINGS) ×3 IMPLANT
BNDG ELASTIC 4X5.8 VLCR STR LF (GAUZE/BANDAGES/DRESSINGS) ×3 IMPLANT
BNDG ELASTIC 6X5.8 VLCR STR LF (GAUZE/BANDAGES/DRESSINGS) ×3 IMPLANT
BNDG ESMARK 6X9 LF (GAUZE/BANDAGES/DRESSINGS) ×3
BNDG GAUZE ELAST 4 BULKY (GAUZE/BANDAGES/DRESSINGS) ×5 IMPLANT
CANISTER SUCT 3000ML PPV (MISCELLANEOUS) ×3 IMPLANT
CLIP LIGATING EXTRA MED SLVR (CLIP) ×3 IMPLANT
CLIP LIGATING EXTRA SM BLUE (MISCELLANEOUS) ×3 IMPLANT
CLIP TI LARGE 6 (CLIP) ×1 IMPLANT
CLIP TI MEDIUM 6 (CLIP) ×1 IMPLANT
CLIP VESOCCLUDE MED 6/CT (CLIP) ×3 IMPLANT
COVER BACK TABLE 60X90IN (DRAPES) ×3 IMPLANT
COVER SURGICAL LIGHT HANDLE (MISCELLANEOUS) ×6 IMPLANT
DRAIN CHANNEL 19F RND (DRAIN) IMPLANT
DRAPE EXTREMITY T 121X128X90 (DISPOSABLE) ×1 IMPLANT
DRAPE HALF SHEET 40X57 (DRAPES) ×3 IMPLANT
DRAPE INCISE IOBAN 66X45 STRL (DRAPES) ×4 IMPLANT
DRAPE ORTHO SPLIT 77X108 STRL (DRAPES) ×2
DRAPE SURG ORHT 6 SPLT 77X108 (DRAPES) ×4 IMPLANT
DRAPE U-SHAPE 47X51 STRL (DRAPES) IMPLANT
DRSG ADAPTIC 3X8 NADH LF (GAUZE/BANDAGES/DRESSINGS) ×3 IMPLANT
DRSG PAD ABDOMINAL 8X10 ST (GAUZE/BANDAGES/DRESSINGS) ×4 IMPLANT
ELECT CAUTERY BLADE 6.4 (BLADE) ×3 IMPLANT
ELECT REM PT RETURN 9FT ADLT (ELECTROSURGICAL) ×3
ELECTRODE REM PT RTRN 9FT ADLT (ELECTROSURGICAL) ×2 IMPLANT
EVACUATOR SILICONE 100CC (DRAIN) IMPLANT
GAUZE SPONGE 4X4 12PLY STRL (GAUZE/BANDAGES/DRESSINGS) ×7 IMPLANT
GAUZE SPONGE 4X4 12PLY STRL LF (GAUZE/BANDAGES/DRESSINGS) ×2 IMPLANT
GAUZE XEROFORM 1X8 LF (GAUZE/BANDAGES/DRESSINGS) ×1 IMPLANT
GLOVE SRG 8 PF TXTR STRL LF DI (GLOVE) ×4 IMPLANT
GLOVE SURG POLYISO LF SZ8 (GLOVE) IMPLANT
GLOVE SURG UNDER POLY LF SZ8 (GLOVE) ×3
GOWN STRL REUS W/TWL 2XL LVL3 (GOWN DISPOSABLE) ×3 IMPLANT
KIT BASIN OR (CUSTOM PROCEDURE TRAY) ×3 IMPLANT
KIT TURNOVER KIT B (KITS) ×3 IMPLANT
NS IRRIG 1000ML POUR BTL (IV SOLUTION) ×3 IMPLANT
PACK GENERAL/GYN (CUSTOM PROCEDURE TRAY) ×3 IMPLANT
PAD ARMBOARD 7.5X6 YLW CONV (MISCELLANEOUS) ×6 IMPLANT
SPONGE T-LAP 18X18 ~~LOC~~+RFID (SPONGE) ×2 IMPLANT
STAPLER VISISTAT 35W (STAPLE) ×3 IMPLANT
STOCKINETTE IMPERVIOUS LG (DRAPES) ×3 IMPLANT
SUT ETHILON 3 0 PS 1 (SUTURE) IMPLANT
SUT SILK 0 TIES 10X30 (SUTURE) ×3 IMPLANT
SUT SILK 2 0 (SUTURE) ×1
SUT SILK 2 0 SH CR/8 (SUTURE) ×3 IMPLANT
SUT SILK 2-0 18XBRD TIE 12 (SUTURE) ×2 IMPLANT
SUT VIC AB 2-0 CT1 18 (SUTURE) ×6 IMPLANT
SUT VIC AB 3-0 SH 18 (SUTURE) IMPLANT
TOWEL GREEN STERILE (TOWEL DISPOSABLE) ×6 IMPLANT
UNDERPAD 30X36 HEAVY ABSORB (UNDERPADS AND DIAPERS) ×3 IMPLANT
WATER STERILE IRR 1000ML POUR (IV SOLUTION) ×3 IMPLANT

## 2021-04-22 NOTE — Progress Notes (Signed)
?PROGRESS NOTE ? ?Brendan Campbell  ?DOB: 1970-02-09  ?PCP: Monico Blitz, MD ?VOH:[id number]  ?DOA: 04/20/2021 ? LOS: 2 days  ?Hospital Day: 3 ? ?Brief narrative: ?Brendan Campbell is a 51 y.o. 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 ?Patient presented to the ED on 3/14 with worsening left BKA stump infection. ? ?Patient underwent left leg BKA on 02/16/2021.  In February, he started to complain of progressive pain at the left BKA stump site.  Staples were removed.  His pain continues to increase and he also started having foul-smelling discharge.  ?On 3/14, he was seen by vascular surgery in the office.  There was concern of deep tissue infection and hence patient was sent to the ED.   ?Of note, patient also missed his scheduled dialysis on Monday 3/13 because of inability to move with severe BKA stump pain. ? ?In the ED, patient was afebrile, hemodynamically stable ?Labs with WC count 9.7, lactic acid 1.7 ?  ?Subjective: ?Patient was seen and examined this morning.  ?Sitting up in bed.  Not in distress.  Waiting for surgery today ?This morning, patient had episodes of hypoglycemia. ? ?Principal Problem: ?  Osteomyelitis ([place]) ?Active Problems: ?  CHF (congestive heart failure) ([place]) ?  Osteomyelitis of ankle or foot, acute, left ([place]) ?  ?Assessment and Plan: ?Left BKA stump infected ulcer ?-Underwent left BKA in January 2023 ?-Progressively worsening infection since then associated with pain, foul-smelling discharge ?-Vascular surgery plans for AKA today 3/16. ?-Currently covered on broad-spectrum IV antibiotics ?-Pain control with oxycodone and Dilaudid as needed. ?-Eliquis held.  Currently on heparin drip ? ?ESRD HD MWF ?-Nephrology consult appreciated. ?-HD per usual schedule. ? ?Hyperkalemia ?-Secondary to missing dialysis on Monday.   ?-1 dose of Lokelma was given in the ED.  potassium level improved on subsequent check ?Recent Labs  ?Lab 04/20/21 ?1234 04/21/21 ?0110 04/22/21 ?0129  ?K 5.6* 4.6 3.9  ?PHOS  --   --  4.8*  ? ?Chronic combined systolic and diastolic CHF  ?Essential hypertension ?-Clinically euvolemic.   ?-Chest x-ray did not show acute infiltrates or signs of fluid overload.   ?-I did not see any CHF/hypertension meds in his list. ? ?Paroxysmal A-fib ?-Continue amiodarone ?-Eliquis on hold. Currently on heparin drip ?  ?Type 2 diabetes mellitus with hypOglycemia ?-A1c 5.3 on 3/14 ?-Currently not on scheduled insulin or oral antidiabetic med ?-Blood sugar level was low this morning at 67.  Dextrose ampule was given.  Because patient is n.p.o. for surgery today, I started him on D10 drip. ?Recent Labs  ?Lab 04/21/21 ?1156 04/21/21 ?2157 04/22/21 ?0806 04/22/21 ?6948 04/22/21 ?1154  ?GLUCAP 89 71 67* 132* 78  ?  ?Peripheral artery disease ?s/p prior bilateral BKA ?-PTA, patient was on aspirin, Eliquis, Lipitor.  ?-Eliquis and aspirin on hold.  Currently on heparin drip.  Continue statin ?  ?Anemia secondary to CKD ?-H&H stable. Continue systemic anticoagulation. ?-Continue to monitor ?Recent Labs  ?  07/20/20 ?0248 07/21/20 ?0532 07/21/20 ?5462 07/28/20 ?7035 08/28/20 ?1131 02/04/21 ?0727 02/05/21 ?0518 02/15/21 ?0093 02/15/21 ?8182 04/09/21 ?1442 04/09/21 ?2038 04/20/21 ?1234 04/21/21 ?0110 04/22/21 ?0129  ?HGB 10.4*  --    < >  --    < >  --    < > 9.5*   < > 10.7* 9.5* 11.6* 12.4* 11.7*  ?MCV 106.2*  --    < >  --    < >  --    < >  104.7*   < > 107.4* 105.3* 104.9* 103.1* 102.3*  ?MVHQIONG29  --   --   --  450  --  757  --   --   --   --   --   --   --   --   ?FOLATE  --   --   --  10.9  --  10.3  --   --   --   --   --   --   --   --   ?FERRITIN 1,303* 1,848*  --   --   --   --   --  860*  --   --   --   --   --   --   ?TIBC  --   --   --   --   --   --   --  192*  --   --   --   --   --   --   ?IRON  --   --   --   --   --   --   --  41*  --   --   --   --   --   --   ?  < > = values in this interval not displayed.  ? ? ?Goals of care ?  Code Status: Full Code  ? ? ?Mobility: Bilateral BKA status ? ?Nutritional status:  ?Body mass index is 29.77 kg/m?.  ?  ?  ? ? ? ? ?Diet:  ?Diet Order   ? ?       ?  Diet NPO time specified  Diet effective midnight       ?  ? ?  ?  ? ?  ? ? ?DVT prophylaxis: Heparin drip ? ?  ?Antimicrobials: Cefepime Flagyl vancomycin ?Fluid: None ?Consultants: Nephrology, vascular surgery ?Family Communication: None at bedside ? ?Status is: Inpatient ? ?Continue in-hospital care because: Pending left AKA today ?Level of care: Telemetry Medical  ? ?Dispo: The patient is from: Home ?             Anticipated d/c is to: Pending clinical course ?             Patient currently is not medically stable to d/c. ?  Difficult to place patient No ? ? ? ? ?Infusions:  ? sodium chloride 10 mL/hr at 04/22/21 1226  ? [MAR Hold] ceFEPime (MAXIPIME) IV Stopped (04/22/21 0041)  ? dextrose 50 mL/hr at 04/22/21 0845  ? heparin 900 Units/hr (04/21/21 2136)  ? [MAR Hold] metronidazole Stopped (04/22/21 0327)  ? [MAR Hold] vancomycin Stopped (04/21/21 1950)  ? ? ?Scheduled Meds: ? [MAR Hold] amiodarone  200 mg Oral Daily  ? [MAR Hold] atorvastatin  10 mg Oral Q M,W,F  ? [MAR Hold] calcium acetate  667 mg Oral TID WC  ? chlorhexidine  15 mL Mouth/Throat Once  ? Or  ? mouth rinse  15 mL Mouth Rinse Once  ? [MAR Hold] cholecalciferol  1,000 Units Oral Q M,W,F  ? dextrose      ? dextrose      ? [MAR Hold] insulin aspart  0-6 Units Subcutaneous TID WC  ? [MAR Hold] pantoprazole  40 mg Oral Daily  ? [MAR Hold] sodium zirconium cyclosilicate  10 g Oral Once  ? ? ?PRN meds: ?[MAR Hold] acetaminophen, [MAR Hold] albuterol, [MAR Hold] bisacodyl, [MAR Hold] butalbital-acetaminophen-caffeine, [MAR Hold] diphenhydrAMINE **OR** [DISCONTINUED] diphenhydrAMINE, [MAR Hold] diphenhydrAMINE, [MAR Hold]  hydrALAZINE, [MAR Hold]  HYDROmorphone (DILAUDID) injection, insulin aspart, [MAR Hold] ondansetron  **OR** [MAR Hold] ondansetron (ZOFRAN) IV, [MAR Hold] oxyCODONE, [MAR Hold] senna-docusate, [MAR Hold] tiZANidine  ? ?Antimicrobials: ?Anti-infectives (From admission, onward)  ? ? Start     Dose/Rate Route Frequency Ordered Stop  ? 04/21/21 1400  [MAR Hold]  ceFEPIme (MAXIPIME) 1 g in sodium chloride 0.9 % 100 mL IVPB        (MAR Hold since Thu 04/22/2021 at 1207.Hold Reason: Transfer to a Procedural area)  ? 1 g ?200 mL/hr over 30 Minutes Intravenous Every 24 hours 04/20/21 1322    ? 04/21/21 1200  [MAR Hold]  vancomycin (VANCOCIN) IVPB 1000 mg/200 mL premix        (MAR Hold since Thu 04/22/2021 at 1207.Hold Reason: Transfer to a Procedural area)  ? 1,000 mg ?200 mL/hr over 60 Minutes Intravenous Every M-W-F (Hemodialysis) 04/20/21 1322    ? 04/20/21 1330  [MAR Hold]  metroNIDAZOLE (FLAGYL) IVPB 500 mg        (MAR Hold since Thu 04/22/2021 at 1207.Hold Reason: Transfer to a Procedural area)  ? 500 mg ?100 mL/hr over 60 Minutes Intravenous Every 12 hours 04/20/21 1317    ? 04/20/21 1330  vancomycin (VANCOREADY) IVPB 2000 mg/400 mL       ? 2,000 mg ?200 mL/hr over 120 Minutes Intravenous  Once 04/20/21 1319 04/20/21 1430  ? 04/20/21 1330  ceFEPIme (MAXIPIME) 2 g in sodium chloride 0.9 % 100 mL IVPB       ? 2 g ?200 mL/hr over 30 Minutes Intravenous  Once 04/20/21 1319 04/20/21 1435  ? ?  ? ? ?Objective: ?Vitals:  ? 04/22/21 0808 04/22/21 1210  ?BP: 112/77 (!) 103/49  ?Pulse: (!) 110 (!) 118  ?Resp: 17 18  ?Temp:  98.2 ?F (36.8 ?C)  ?SpO2: 100% 100%  ? ? ?Intake/Output Summary (Last 24 hours) at 04/22/2021 1321 ?Last data filed at 04/22/2021 0900 ?Gross per 24 hour  ?Intake 997.24 ml  ?Output 700 ml  ?Net 297.24 ml  ? ? ?Filed Weights  ? 04/20/21 1427 04/21/21 1625 04/21/21 2000  ?Weight: 93 kg 95 kg 94.1 kg  ? ?Weight change: 2 kg ?Body mass index is 29.77 kg/m?.  ? ?Physical Exam: ?General exam: Pleasant, middle-aged Caucasian male.  Chronically sick looking.  Not in acute distress ?Skin: No rashes, lesions or  ulcers. ?HEENT: Atraumatic, normocephalic, no obvious bleeding ?Lungs: Clear to auscultation bilaterally ?CVS: Regular rate and rhythm, no murmur ?GI/Abd soft, nontender, nondistended, bowel sound present ?CNS: [place]

## 2021-04-22 NOTE — Progress Notes (Incomplete)
Initial Nutrition Assessment ? ?DOCUMENTATION CODES:  ?  ? ?INTERVENTION:  ?*** ? ? ?NUTRITION DIAGNOSIS:  ?  related to   as evidenced by  . ? ?GOAL:  ?  ? ?MONITOR:  ?  ? ?REASON FOR ASSESSMENT:  ?Rounds (wounds (AKA planned)) ?  ? ?ASSESSMENT:  ?51 y.o. male with history of PAF, CHF, DM type 2, diabetic neuropathy, ESRD on HD MWF, PTSD, anemia, and S/P bilateral BKAs presented to ED from vascular appointment with worsening left BKA stump infection ? ?02/16/21 - left BKA  ? ?At outpatient appointment, vascular surgeon determined  left BKA was not salvageable and scheduled AKA 3/16. ? ?*** ? ?Average Meal Intake: ?3/14-3/15: 58% intake x 3 recorded meals ? ?Nutritionally Relevant Medications: ?Scheduled Meds: ? atorvastatin  10 mg Oral Q M,W,F  ? calcium acetate  667 mg Oral TID WC  ? cholecalciferol  1,000 Units Oral Q M,W,F  ? insulin aspart  0-6 Units Subcutaneous TID WC  ? pantoprazole  40 mg Oral Daily  ? sodium zirconium cyclosilicate  10 g Oral Once  ? ?Continuous Infusions: ? dextrose 50 mL/hr at 04/22/21 0845  ? ?PRN Meds: bisacodyl, ondansetron, senna-docusate ? ?Labs Reviewed: ?Sodium 133, chloride 97 ?BUN 21, creatinine 4.28 ?Phosphorus 4.8 ?CBG ranges from 67-132 mg/dL over the last 24 hours ?HgbA1c 5.3% (3/15) ? ?NUTRITION - FOCUSED PHYSICAL EXAM: ?{RD Focused Exam List:21252} ? ?Diet Order:   ?Diet Order   ? ?       ?  Diet NPO time specified  Diet effective midnight       ?  ? ?  ?  ? ?  ? ? ?EDUCATION NEEDS:  ?  ? ?Skin:    ? ?Last BM:  3/13 ? ?Height:  ?Ht Readings from Last 1 Encounters:  ?04/22/21 5\' 10"  (1.778 m)  ? ? ?Weight:  ?Wt Readings from Last 1 Encounters:  ?04/21/21 94.1 kg  ? ? ?Ideal Body Weight:  63.5 kg (adjusted by 15.9% for AKA and BKA) ? ?BMI:  Body mass index is 35.4 kg/m? (using 94.1kg, 3/15 weight). ? ?Estimated Nutritional Needs:  ?Kcal:  2300-2500 kcal/d ?Protein:  120-150 g/d ?Fluid:  1L + UOP ? ? ?Ranell Patrick, RD, LDN ?Clinical Dietitian ?RD pager # available in [place]   ?After hours/weekend pager # available in [place] ?

## 2021-04-22 NOTE — Consult Note (Signed)
? ?NAME:  Brendan Campbell, MRN:  [id number], DOB:  1970/09/17, LOS: 2 ?ADMISSION DATE:  04/20/2021, CONSULTATION DATE:  04/22/21 ?REFERRING MD:  Anesthesia, CHIEF COMPLAINT:  Hypotension  ? ?History of Present Illness:  ? ?51 year old male with prior hx significant for ESRD on MWF iHD, IDDM, combined HF, PAD, PVD, HLD, chronic anemia, peripheral neuropathy, anxiety/ depression, prior bilateral BKA, wheelchair bound, and poorly compliant with medical therapies who presented on 3/14 with worsening left BKA stump infection.  Previously underwent left leg BKA on 02/16/21 but developed progressive pain in February.  Was seen by vascular surgery in the office and found to have foul smelling discharge, therefore sent to ER given concern for deep tissue infection.  He was admitted to [facility].   Started on broad spectrum antibiotics.  Went to OR 3/16 with vascular for revision of left BKA to AKA and right stump wash out.  After induction, patient became profound hypotensive requiring emergent CVL and Aline placement, pressors, and albumin, otherwise procedure completed with no significant blood loss.  He was extubated and sent to ICU for close monitoring, currently off vasopressors.  PCCM asked to assist with medical management while in ICU.  ? ?Reports he lives with a room mate and cares for himself.  Recent MRSA bacteremia (01/2021).  Refused to go to SNF on February discharge. ? ?Pertinent  Medical History  ?ESRD on MWF iHD, IDDM, HFrEF, PAD, PVD, HLD, chronic anemia, peripheral neuropathy, anxiety/ depression, prior bilateral BKA, and wheelchair bound  ?Former smoker ? ?Significant Hospital Events: ?Including procedures, antibiotic start and stop dates in addition to other pertinent events   ?3/14 admitted [place] non healing bilateral BKA wounds  ?iHD ?3/16 OR, profoundly hypotensive with induction, L AKA, R stump washout ? ?Interim History / Subjective:  ?No complaints  ? ?Objective   ?Blood pressure (!) 103/49, pulse (!) 118,  temperature 98.2 ?F (36.8 ?C), resp. rate 18, height 5\' 10"  (1.778 m), weight 94.1 kg, SpO2 100 %. ?   ?   ? ?Intake/Output Summary (Last 24 hours) at 04/22/2021 1606 ?Last data filed at 04/22/2021 1454 ?Gross per 24 hour  ?Intake 1371.8 ml  ?Output 900 ml  ?Net 471.8 ml  ? ?Filed Weights  ? 04/20/21 1427 04/21/21 1625 04/21/21 2000  ?Weight: 93 kg 95 kg 94.1 kg  ? ? ?Examination: ?General:  chronically ill appearing male, appears much older than age ?HEENT: MM pink/moist, R eye 5/ non reactive (prior surgery), L 3/reactive, anicteric, edentulous on upper, poor dentition on lowers ?Neuro:  Awake, oriented to person, place, month/year, MAE ?CV: rr, ST ?PULM:  non labored, CTA, on minimal [place] ?GI: soft, bs+, ND/ NT ?Extremities:  L AKA- wrapped, R BKA wrapped in ace bandage, LUE AVG +b/t ?Skin:  multiple round lesions scattered on body, heavy in UE extremities, few scabbed, some with pustular drainage ? ? ?Resolved Hospital Problem list   ? ?Assessment & Plan:  ? ?Hypotension secondary to anesthesia ?- currently resolved and off pressors.  Minimal reported blood loss ~200 ml ?- to monitor in ICU overnight and can be transferred back out to floor and to [facility] 3/17 if stable overnight ?- recheck CBC and BMET for completeness  ?- cont right femoral CVL and Aline for now.  Remove prior to transfer out of ICU ? ? ?Non-healing and Infected BKA wounds s/p Left AKA and R stump washout 3/16 ?PAD ?- per vascular surgery  ?- continue abx> cefepime/ vanc for now and follow culture data ?-  multimodal pain management w/ bowel management ? ? ?ESRD on MWF iHD ?- missed iHD on 3/13.  iHD completed on 3/15 w/ 778ml UF ?- per nephrology ?- next dialysis 3/17 ?- trend renal indices  ? ? ?Systolic and diastolic HF ?- 01/8785 EF 25-30%, global hypokinesis, indeterminate diastolic function, low systolic RV function ?- monitor volume status closely.  Lungs clear currently.  ? ? ?Chronic atrial fibrillation on Eliquis  ?- cont amiodarone ?-  heparin gtt for now, transition back to Eliquis when ok with Vascular  ? ? ?Chronic anemia  ?- trend CBC, monitor for bleeding while on systemic anticoagulation  ? ? ?DMT2 ?Hypoglycemia  ?- not on home insulin or diabetic meds.  A1c 5.3 this admit.  Has been hypoglycemic this am while NPO.  ?- remains on D10 at 31ml/hr.  Wean as able.   Continue to monitor CBGs closely  ? ? ?HLD ?- cont Lipitor ? ? ?Medical non-compliance  ?- likely will need SNF but refused last admission.  Unclear if has home health support.  Would benefit from palliative care consult  ? ? ?Best Practice (right click and "Reselect all SmartList Selections" daily)  ? ?Diet/type: Regular consistency (see orders)- resume renal diet with fluid restriction 1232ml ?DVT prophylaxis: systemic heparin ?GI prophylaxis: PPI ?Lines: Central line R femoral and R femoral Aline ?Foley:  N/A ?Code Status:  full code ?Last date of multidisciplinary goals of care discussion [pending] ? ?Labs   ?CBC: ?Recent Labs  ?Lab 04/20/21 ?1234 04/21/21 ?0110 04/22/21 ?0129  ?WBC 9.7 10.1 10.7*  ?NEUTROABS 8.2*  --  8.6*  ?HGB 11.6* 12.4* 11.7*  ?HCT 36.4* 36.4* 34.9*  ?MCV 104.9* 103.1* 102.3*  ?PLT 165 127* 125*  ? ? ?Basic Metabolic Panel: ?Recent Labs  ?Lab 04/20/21 ?1234 04/21/21 ?0110 04/22/21 ?0129  ?NA 134* 134* 133*  ?K 5.6* 4.6 3.9  ?CL 100 99 97*  ?CO2 21* 20* 22  ?GLUCOSE 130* 99 67*  ?BUN 33* 34* 21*  ?CREATININE 6.13* 6.02* 4.28*  ?CALCIUM 8.2* 8.1* 7.8*  ?PHOS  --   --  4.8*  ? ?GFR: ?Estimated Creatinine Clearance: 23.8 mL/min (A) (by C-G formula based on SCr of 4.28 mg/dL (H)). ?Recent Labs  ?Lab 04/20/21 ?1234 04/20/21 ?1314 04/21/21 ?0110 04/22/21 ?0129  ?WBC 9.7  --  10.1 10.7*  ?LATICACIDVEN  --  1.7  --   --   ? ? ?Liver Function Tests: ?Recent Labs  ?Lab 04/20/21 ?1234 04/22/21 ?0129  ?AST 24  --   ?ALT 17  --   ?ALKPHOS 106  --   ?BILITOT 1.6*  --   ?PROT 5.6*  --   ?ALBUMIN 2.1* 2.1*  ? ?No results for input(s): LIPASE, AMYLASE in the last 168  hours. ?No results for input(s): AMMONIA in the last 168 hours. ? ?ABG ?   ?Component Value Date/Time  ? HCO3 22.7 07/03/2020 0746  ? TCO2 28 02/16/2021 0958  ? ACIDBASEDEF 1.7 07/03/2020 0746  ? O2SAT 98.6 07/03/2020 0746  ?  ? ?Coagulation Profile: ?No results for input(s): INR, PROTIME in the last 168 hours. ? ?Cardiac Enzymes: ?No results for input(s): CKTOTAL, CKMB, CKMBINDEX, TROPONINI in the last 168 hours. ? ?HbA1C: ?HB A1C (BAYER DCA - WAIVED)  ?Date/Time Value Ref Range Status  ?12/16/2015 09:04 AM 5.4 <7.0 % Final  ?  Comment:  ?  Diabetic Adult            <7.0 ?                                      Healthy Adult        4.3 - 5.7 ?                                                          (DCCT/NGSP) ?American Diabetes Association's Summary of Glycemic Recommendations ?for Adults with Diabetes: Hemoglobin A1c <7.0%. More stringent ?glycemic goals (A1c <6.0%) may further reduce complications at the ?cost of increased risk of hypoglycemia. ?  ?09/15/2015 08:22 AM 6.5 <7.0 % Final  ?  Comment:  ?                                        Diabetic Adult            <7.0 ?                                      Healthy Adult        4.3 - 5.7 ?                                                          (DCCT/NGSP) ?American Diabetes Association's Summary of Glycemic Recommendations ?for Adults with Diabetes: Hemoglobin A1c <7.0%. More stringent ?glycemic goals (A1c <6.0%) may further reduce complications at the ?cost of increased risk of hypoglycemia. ?  ? ?Hgb A1c MFr Bld  ?Date/Time Value Ref Range Status  ?04/20/2021 05:43 PM 5.3 4.8 - 5.6 % Final  ?  Comment:  ?  (NOTE) ?        Prediabetes: 5.7 - 6.4 ?        Diabetes: >6.4 ?        Glycemic control for adults with diabetes: <7.0 ?  ?02/05/2021 05:18 AM 5.5 4.8 - 5.6 % Final  ?  Comment:  ?  (NOTE) ?Pre diabetes:          5.7%-6.4% ? ?Diabetes:              >6.4% ? ?Glycemic control for   <7.0% ?adults with diabetes ?   ? ? ?CBG: ?Recent Labs  ?Lab 04/22/21 ?1154 04/22/21 ?1326 04/22/21 ?1413 04/22/21 ?1445 04/22/21 ?1555  ?GLUCAP 78 76 71 110* 86  ? ? ?Review of Systems:   ?Review of systems negative.   ? ?Past Medical History:  ?He,  has a p

## 2021-04-22 NOTE — Anesthesia Procedure Notes (Signed)
Procedure Name: LMA Insertion ?Date/Time: 04/22/2021 1:41 PM ?Performed by: Inda Coke, CRNA ?Pre-anesthesia Checklist: Patient identified, Emergency Drugs available, Suction available and Patient being monitored ?Patient Re-evaluated:Patient Re-evaluated prior to induction ?Oxygen Delivery Method: Circle System Utilized ?Preoxygenation: Pre-oxygenation with 100% oxygen ?Induction Type: IV induction ?Ventilation: Mask ventilation without difficulty ?LMA: LMA inserted ?LMA Size: 4.0 ?Number of attempts: 1 ?Airway Equipment and Method: Bite block ?Placement Confirmation: positive ETCO2 ?Tube secured with: Tape ?Dental Injury: Teeth and Oropharynx as per pre-operative assessment  ? ? ? ? ?

## 2021-04-22 NOTE — Anesthesia Procedure Notes (Signed)
Procedure Name: Intubation ?Date/Time: 04/22/2021 2:17 PM ?Performed by: Inda Coke, CRNA ?Pre-anesthesia Checklist: Patient identified, Emergency Drugs available, Suction available and Patient being monitored ?Patient Re-evaluated:Patient Re-evaluated prior to induction ?Oxygen Delivery Method: Circle System Utilized ?Preoxygenation: Pre-oxygenation with 100% oxygen ?Induction Type: IV induction ?Ventilation: Mask ventilation without difficulty ?Laryngoscope Size: Mac and 4 ?Grade View: Grade I ?Tube type: Oral ?Tube size: 7.5 mm ?Number of attempts: 1 ?Airway Equipment and Method: Stylet and Oral airway ?Placement Confirmation: ETT inserted through vocal cords under direct vision, positive ETCO2 and breath sounds checked- equal and bilateral ?Secured at: 22 cm ?Tube secured with: Tape ?Dental Injury: Teeth and Oropharynx as per pre-operative assessment  ? ? ? ? ?

## 2021-04-22 NOTE — Progress Notes (Signed)
?[facility] ?Progress Note  ? ?Subjective:   HD yesterday, no issues.  For AKA today, NPO.   ? ?Objective ?Vitals:  ? 04/21/21 2000 04/21/21 2143 04/22/21 0502 04/22/21 4098  ?BP: 121/67 116/70 118/89 112/77  ?Pulse: 91 (!) 105 (!) 108 (!) 110  ?Resp: 18 18 18 17   ?Temp: 98.5 ?F (36.9 ?C) 98.9 ?F (37.2 ?C)    ?TempSrc: Temporal Temporal    ?SpO2: 98% 100% 96% 100%  ?Weight: 94.1 kg     ?Height:      ? ?Physical Exam ?General appearance: fatigued, no distress, and chronically ill-appearing ?Resp: clear to auscultation bilaterally ?Cardio: regular rate and rhythm ?GI: soft, non-tender ?Extremities: s/p bilateral BKA's, LBKA dressing intact, LUE AVG +T/B, no bleeding when dressing removed. ?  ? ?Additional Objective ?Labs: ?Basic Metabolic Panel: ?Recent Labs  ?Lab 04/20/21 ?1234 04/21/21 ?0110 04/22/21 ?0129  ?NA 134* 134* 133*  ?K 5.6* 4.6 3.9  ?CL 100 99 97*  ?CO2 21* 20* 22  ?GLUCOSE 130* 99 67*  ?BUN 33* 34* 21*  ?CREATININE 6.13* 6.02* 4.28*  ?CALCIUM 8.2* 8.1* 7.8*  ?PHOS  --   --  4.8*  ? ? ?Liver Function Tests: ?Recent Labs  ?Lab 04/20/21 ?1234 04/22/21 ?0129  ?AST 24  --   ?ALT 17  --   ?ALKPHOS 106  --   ?BILITOT 1.6*  --   ?PROT 5.6*  --   ?ALBUMIN 2.1* 2.1*  ? ? ?No results for input(s): LIPASE, AMYLASE in the last 168 hours. ?CBC: ?Recent Labs  ?Lab 04/20/21 ?1234 04/21/21 ?0110 04/22/21 ?0129  ?WBC 9.7 10.1 10.7*  ?NEUTROABS 8.2*  --  8.6*  ?HGB 11.6* 12.4* 11.7*  ?HCT 36.4* 36.4* 34.9*  ?MCV 104.9* 103.1* 102.3*  ?PLT 165 127* 125*  ? ? ?Blood Culture ?   ?Component Value Date/Time  ? SDES BLOOD RIGHT FOREARM 04/20/2021 1322  ? SPECREQUEST  04/20/2021 1322  ?  BOTTLES DRAWN AEROBIC AND ANAEROBIC Blood Culture adequate volume  ? CULT  04/20/2021 1322  ?  NO GROWTH 1 DAY ?Performed at [facility], [place] [address], [place], [place] [address] ?  ? REPTSTATUS PENDING 04/20/2021 1322  ? ? ?Cardiac Enzymes: ?No results for input(s): CKTOTAL, CKMB, CKMBINDEX, TROPONINI in the last 168  hours. ?CBG: ?Recent Labs  ?Lab 04/21/21 ?0735 04/21/21 ?1156 04/21/21 ?2157 04/22/21 ?7829 04/22/21 ?0837  ?GLUCAP 86 89 71 67* 132*  ? ? ?Iron Studies: No results for input(s): IRON, TIBC, TRANSFERRIN, FERRITIN in the last 72 hours. ?@lablastinr3 @ ?Studies/Results: ?DG Chest 1 View ? ?Result Date: 04/20/2021 ?CLINICAL DATA:  51 year old male presents for evaluation of foul-smelling drainage from a wound post BKA. EXAM: CHEST  1 VIEW COMPARISON:  April 09, 2021 FINDINGS: EKG leads project over the chest. Trachea is midline. Cardiomediastinal contours remain enlarged and extension weighted by portable technique and patient body habitus and AP projection. No lobar consolidation. Mild blunting of LEFT costodiaphragmatic sulcus and suggestion of mild elevation of the lateral LEFT hemidiaphragm. No pneumothorax. On limited assessment there is no acute skeletal finding. IMPRESSION: Small LEFT-sided effusion. Cardiomegaly. Electronically Signed   By: Zetta Bills M.D.   On: 04/20/2021 14:03   ?Medications: ? ceFEPime (MAXIPIME) IV Stopped (04/22/21 0041)  ? dextrose 50 mL/hr at 04/22/21 0845  ? heparin 900 Units/hr (04/21/21 2136)  ? metronidazole Stopped (04/22/21 0327)  ? vancomycin Stopped (04/21/21 1950)  ? ? acetaminophen  1,000 mg Oral Once  ? amiodarone  200 mg Oral Daily  ?  atorvastatin  10 mg Oral Q M,W,F  ? calcium acetate  667 mg Oral TID WC  ? cholecalciferol  1,000 Units Oral Q M,W,F  ? dextrose      ? gabapentin  300 mg Oral Once  ? insulin aspart  0-6 Units Subcutaneous TID WC  ? pantoprazole  40 mg Oral Daily  ? sodium zirconium cyclosilicate  10 g Oral Once  ? ? ?Dialysis Orders:  [name]  on MWF ? 4h 61min  95 kg   1K/2.25 bath  Hep 1000+ 500u/hr  LUE AVG  300/500   ?- prosthetic weighs 1.6 kg ? - Epogen 1000   Units IV/HD   ?  ?Assessment/Plan ?**stump infection:  broad spectrum antibiotics.  Plan OR Thursday.  ?  ?**ESRD on HD:  HD per usual schedule MWF - heparin per outpt held due to  systemic heparin gtt for periop a fib anticoag.  Next HD tomorrow.  ?  ?**Anemia:  Hb in 11s, follow.  On low dose epogen outpt.  ?  ?**Nutrition: supplements ?  ?**HL: statin   ? ?**PAF: eliquis on hold for OR.  Rate controlled. ? ?Jannifer Hick MD ?04/22/2021, 9:24 AM  ?[facility] ?Pager: [phone] ? ? ?

## 2021-04-22 NOTE — Anesthesia Preprocedure Evaluation (Addendum)
Anesthesia Evaluation  ?Patient identified by MRN, date of birth, ID band ?Patient awake ? ? ? ?Reviewed: ?Allergy & Precautions, NPO status , Patient's Chart, lab work & pertinent test results ? ?Airway ?Mallampati: II ? ?TM Distance: >3 FB ?Neck ROM: Full ? ? ? Dental ? ?(+) Dental Advisory Given, Edentulous Upper, Missing, Poor Dentition ?  ?Pulmonary ?former smoker,  ?  ?Pulmonary exam normal ?breath sounds clear to auscultation ? ? ? ? ? ? Cardiovascular ?+ Peripheral Vascular Disease and +CHF  ?Normal cardiovascular exam ?Rhythm:Regular Rate:Normal ? ? ?  ?Neuro/Psych ?PSYCHIATRIC DISORDERS Anxiety Depression  Neuromuscular disease   ? GI/Hepatic ?negative GI ROS, Neg liver ROS,   ?Endo/Other  ?diabetes, Type 2, Insulin Dependent ? Renal/GU ?ESRF and DialysisRenal disease (MWF, K+ 3.9)  ? ?  ?Musculoskeletal ? ?(+) Arthritis ,  ? Abdominal ?  ?Peds ? Hematology ? ?(+) Blood dyscrasia (Eliquis), anemia ,   ?Anesthesia Other Findings ?Day of surgery medications reviewed with the patient. ? Reproductive/Obstetrics ? ?  ? ? ? ? ? ? ? ? ? ? ? ? ? ?  ?  ? ? ? ? ? ? ? ?Anesthesia Physical ?Anesthesia Plan ? ?ASA: 4 ? ?Anesthesia Plan: General  ? ?Post-op Pain Management: Tylenol PO (pre-op)* and Gabapentin PO (pre-op)*  ? ?Induction: Intravenous ? ?PONV Risk Score and Plan: 2 and Dexamethasone and Ondansetron ? ?Airway Management Planned: LMA ? ?Additional Equipment:  ? ?Intra-op Plan:  ? ?Post-operative Plan: Extubation in OR ? ?Informed Consent: I have reviewed the patients History and Physical, chart, labs and discussed the procedure including the risks, benefits and alternatives for the proposed anesthesia with the patient or authorized representative who has indicated his/her understanding and acceptance.  ? ? ? ?Dental advisory given ? ?Plan Discussed with: CRNA ? ?Anesthesia Plan Comments: (ETOMIDATE ?)  ? ? ? ? ? ? ?Anesthesia Quick Evaluation ? ?

## 2021-04-22 NOTE — Anesthesia Postprocedure Evaluation (Signed)
Anesthesia Post Note ? ?Patient: Brendan Campbell ? ?Procedure(s) Performed: LEFT ABOVE KNEE AMPUTATION (Left: Knee) ?DEBRIDEMENT WOUND (Right: Leg Lower) ? ?  ? ?Patient location during evaluation: SICU ?Anesthesia Type: General ?Level of consciousness: awake and alert ?Pain management: pain level controlled ?Vital Signs Assessment: post-procedure vital signs reviewed and stable ?Respiratory status: spontaneous breathing, nonlabored ventilation, respiratory function stable and patient connected to nasal cannula oxygen ?Cardiovascular status: blood pressure returned to baseline and stable ?Postop Assessment: no apparent nausea or vomiting ?Anesthetic complications: no (Unanticipated ICU admission) ?Comments: See intra-op record for summary of events that led to ICU admission.  Upon arrival to ICU, patient awake, alert, and oriented.  Requiring no vasoactive infusions.  VSS. ? ? ?No notable events documented. ? ?Last Vitals:  ?Vitals:  ? 04/22/21 1900 04/22/21 2000  ?BP:    ?Pulse: (!) 107 (!) 101  ?Resp: (!) 9 (!) 7  ?Temp:  36.9 ?C  ?SpO2: 96% 96%  ?  ?Last Pain:  ?Vitals:  ? 04/22/21 2000  ?TempSrc: Oral  ?PainSc: 5   ? ? ?  ?  ?  ?  ?  ?  ? ?[place] ? ? ? ? ?

## 2021-04-22 NOTE — Transfer of Care (Signed)
Immediate Anesthesia Transfer of Care Note ? ?Patient: Brendan Campbell ? ?Procedure(s) Performed: LEFT ABOVE KNEE AMPUTATION (Left: Knee) ?DEBRIDEMENT WOUND (Right: Leg Lower) ? ?Patient Location: ICU ? ?Anesthesia Type:General ? ?Level of Consciousness: awake and alert  ? ?Airway & Oxygen Therapy: Patient Spontanous Breathing and Patient connected to face mask oxygen ? ?Post-op Assessment: Report given to RN, Post -op Vital signs reviewed and stable and Patient moving all extremities X 4 ? ?Post vital signs: Reviewed and stable ? ?Last Vitals:  ?Vitals Value Taken Time  ?BP    ?Temp    ?Pulse 108 04/22/21 1554  ?Resp 11 04/22/21 1554  ?SpO2 100 % 04/22/21 1554  ?Vitals shown include unvalidated device data. ? ?Last Pain:  ?Vitals:  ? 04/22/21 0602  ?TempSrc:   ?PainSc: Asleep  ?   ? ?Patients Stated Pain Goal: 0 (04/20/21 2030) ? ?Complications: No notable events documented. ?

## 2021-04-22 NOTE — Progress Notes (Signed)
Hypoglycemic Event ? ?CBG: 67 ? ?Treatment: D 50 % 25 G/50 ML ? ?Symptoms: Irritable  ? ?Follow-up CBG: Time: 08:38 CBG Result: 132 ? ?Possible Reasons for Event: NPO for surgery  ? ?Comments/MD notified:Changed IV LR D 5 to D 10 ? ? ? ?Brendan Campbell ? ? ?

## 2021-04-22 NOTE — Plan of Care (Signed)
  Problem: Clinical Measurements: Goal: Diagnostic test results will improve Outcome: Progressing   Problem: Activity: Goal: Risk for activity intolerance will decrease Outcome: Progressing   Problem: Nutrition: Goal: Adequate nutrition will be maintained Outcome: Progressing   

## 2021-04-22 NOTE — Progress Notes (Signed)
ANTICOAGULATION CONSULT NOTE  ? ?Pharmacy Consult for heparin ?Indication: atrial fibrillation ? ?Allergies  ?Allergen Reactions  ? Tape Other (See Comments)  ?  Pulls off the skin  ? Chlorhexidine Gluconate [Chlorhexidine] Rash  ? ? ?Patient Measurements: ?Height: 5\' 10"  (177.8 cm) (pre-amputation height) ?Weight: 94.1 kg (207 lb 7.3 oz) ?IBW/kg (Calculated) : 73 ?Heparin Dosing Weight: 93 kg due to non-obese when using pre-amputation height of 6' ? ?Vital Signs: ?Temp: 98.4 ?F (36.9 ?C) (03/16 2000) ?Temp Source: Oral (03/16 2000) ?BP: 71/58 (03/16 1800) ?Pulse Rate: 105 (03/16 2211) ? ?Labs: ?Recent Labs  ?  04/21/21 ?0110 04/21/21 ?1316 04/22/21 ?0129 04/22/21 ?1736 04/22/21 ?2142  ?HGB 12.4*  --  11.7* 10.2*  --   ?HCT 36.4*  --  34.9* 30.7*  --   ?PLT 127*  --  125* 142*  --   ?APTT >200* 182*  --   --  100*  ?HEPARINUNFRC >1.10* 0.91*  --   --  0.38  ?CREATININE 6.02*  --  4.28* 4.81*  --   ? ? ? ?Estimated Creatinine Clearance: 21.2 mL/min (A) (by C-G formula based on SCr of 4.81 mg/dL (H)). ? ?Medications:  ?Patient taking apixaban 5 mg BID PTA ?Patient reported the last dose of apixaban was 3/13 in the evening ? ?Assessment: ?51  y.o. M on apixaban PTA for afib. Plan for surgery 3/16 so held apixaban and bridging with heparin.  ?Heparin drip restarted post op 900 uts/hr aPTT 100 sec and heparin level 0.38  both at goal  ?- apixaban cleared No bleeding noted. ? ?  ? ?Goal of Therapy:  ?Heparin level 0.3-0.7 units/mL ?Monitor platelets by anticoagulation protocol: Yes ?  ?Plan:   ?Continue heparin at 900 units/hr ?Monitor daily heparin level, CBC ?Monitor for signs/symptoms of bleeding  ? ? ?Bonnita Nasuti Pharm.D. CPP, BCPS ?Clinical Pharmacist ?[phone] ?04/22/2021 10:44 PM  ? ? ?Please check AMION for all [place] phone numbers ?After 10:00 PM, call [place] [phone] ? ? ? ? ? ?

## 2021-04-23 ENCOUNTER — Encounter (HOSPITAL_COMMUNITY): Payer: Self-pay | Admitting: Vascular Surgery

## 2021-04-23 ENCOUNTER — Inpatient Hospital Stay: Payer: Self-pay

## 2021-04-23 DIAGNOSIS — M86252 Subacute osteomyelitis, left femur: Secondary | ICD-10-CM | POA: Diagnosis not present

## 2021-04-23 LAB — POCT I-STAT 7, (LYTES, BLD GAS, ICA,H+H)
Acid-base deficit: 1 mmol/L (ref 0.0–2.0)
Bicarbonate: 23.6 mmol/L (ref 20.0–28.0)
Calcium, Ion: 1.2 mmol/L (ref 1.15–1.40)
HCT: 35 % — ABNORMAL LOW (ref 39.0–52.0)
Hemoglobin: 11.9 g/dL — ABNORMAL LOW (ref 13.0–17.0)
O2 Saturation: 100 %
Patient temperature: 36.8
Potassium: 3.3 mmol/L — ABNORMAL LOW (ref 3.5–5.1)
Sodium: 134 mmol/L — ABNORMAL LOW (ref 135–145)
TCO2: 25 mmol/L (ref 22–32)
pCO2 arterial: 37.5 mmHg (ref 32–48)
pH, Arterial: 7.406 (ref 7.35–7.45)
pO2, Arterial: 454 mmHg — ABNORMAL HIGH (ref 83–108)

## 2021-04-23 LAB — BASIC METABOLIC PANEL
Anion gap: 15 (ref 5–15)
BUN: 23 mg/dL — ABNORMAL HIGH (ref 6–20)
CO2: 18 mmol/L — ABNORMAL LOW (ref 22–32)
Calcium: 8.4 mg/dL — ABNORMAL LOW (ref 8.9–10.3)
Chloride: 97 mmol/L — ABNORMAL LOW (ref 98–111)
Creatinine, Ser: 4.93 mg/dL — ABNORMAL HIGH (ref 0.61–1.24)
GFR, Estimated: 14 mL/min — ABNORMAL LOW (ref >60)
Glucose, Bld: 145 mg/dL — ABNORMAL HIGH (ref 70–99)
Potassium: 4 mmol/L (ref 3.5–5.1)
Sodium: 130 mmol/L — ABNORMAL LOW (ref 135–145)

## 2021-04-23 LAB — CBC WITH DIFFERENTIAL/PLATELET
Abs Immature Granulocytes: 0.09 10*3/uL — ABNORMAL HIGH (ref 0.00–0.07)
Basophils Absolute: 0 10*3/uL (ref 0.0–0.1)
Basophils Relative: 0 %
Eosinophils Absolute: 0 10*3/uL (ref 0.0–0.5)
Eosinophils Relative: 0 %
HCT: 31 % — ABNORMAL LOW (ref 39.0–52.0)
Hemoglobin: 10.4 g/dL — ABNORMAL LOW (ref 13.0–17.0)
Immature Granulocytes: 1 %
Lymphocytes Relative: 2 %
Lymphs Abs: 0.3 10*3/uL — ABNORMAL LOW (ref 0.7–4.0)
MCH: 34.8 pg — ABNORMAL HIGH (ref 26.0–34.0)
MCHC: 33.5 g/dL (ref 30.0–36.0)
MCV: 103.7 fL — ABNORMAL HIGH (ref 80.0–100.0)
Monocytes Absolute: 0.2 10*3/uL (ref 0.1–1.0)
Monocytes Relative: 2 %
Neutro Abs: 12 10*3/uL — ABNORMAL HIGH (ref 1.7–7.7)
Neutrophils Relative %: 95 %
Platelets: 147 10*3/uL — ABNORMAL LOW (ref 150–400)
RBC: 2.99 MIL/uL — ABNORMAL LOW (ref 4.22–5.81)
RDW: 18.6 % — ABNORMAL HIGH (ref 11.5–15.5)
WBC: 12.6 10*3/uL — ABNORMAL HIGH (ref 4.0–10.5)
nRBC: 0 % (ref 0.0–0.2)

## 2021-04-23 LAB — GLUCOSE, CAPILLARY
Glucose-Capillary: 112 mg/dL — ABNORMAL HIGH (ref 70–99)
Glucose-Capillary: 140 mg/dL — ABNORMAL HIGH (ref 70–99)
Glucose-Capillary: 142 mg/dL — ABNORMAL HIGH (ref 70–99)
Glucose-Capillary: 153 mg/dL — ABNORMAL HIGH (ref 70–99)
Glucose-Capillary: 91 mg/dL (ref 70–99)

## 2021-04-23 LAB — HEPARIN LEVEL (UNFRACTIONATED): Heparin Unfractionated: 0.43 IU/mL (ref 0.30–0.70)

## 2021-04-23 MED ORDER — HYDROMORPHONE 1 MG/ML IV SOLN
INTRAVENOUS | Status: DC
  Administered 2021-04-23: 2.6 mg via INTRAVENOUS
  Administered 2021-04-24: 0.6 mg via INTRAVENOUS
  Administered 2021-04-24: 1.2 mg via INTRAVENOUS
  Administered 2021-04-24: 1.5 mg via INTRAVENOUS
  Administered 2021-04-24: 0.6 mg via INTRAVENOUS
  Administered 2021-04-25: 1.8 mg via INTRAVENOUS
  Administered 2021-04-25: 2.7 mg via INTRAVENOUS
  Administered 2021-04-25: 2.1 mg via INTRAVENOUS
  Administered 2021-04-25: 2.7 mg via INTRAVENOUS
  Administered 2021-04-26: 2.4 mg via INTRAVENOUS
  Administered 2021-04-26: 4.2 mL via INTRAVENOUS
  Administered 2021-04-26: 1.2 mg via INTRAVENOUS
  Administered 2021-04-26: 0.3 mg via INTRAVENOUS
  Administered 2021-04-26: 2.1 mL via INTRAVENOUS
  Administered 2021-04-27: 0.9 mg via INTRAVENOUS
  Administered 2021-04-27: 1.2 mg via INTRAVENOUS
  Filled 2021-04-23 (×3): qty 30

## 2021-04-23 MED ORDER — ONDANSETRON HCL 4 MG/2ML IJ SOLN: 4.0000 mg | Freq: Four times a day (QID) | INTRAMUSCULAR | Status: DC | PRN

## 2021-04-23 MED ORDER — MEDIHONEY WOUND/BURN DRESSING EX PSTE
1.0000 | PASTE | Freq: Every day | CUTANEOUS | Status: DC
Start: 2021-04-23 — End: 2021-05-02
  Administered 2021-04-23 – 2021-05-01 (×9): 1 via TOPICAL
  Filled 2021-04-23: qty 44

## 2021-04-23 MED ORDER — SODIUM CHLORIDE 0.9 % IV SOLN
25.0000 mg | Freq: Three times a day (TID) | INTRAVENOUS | Status: DC | PRN
  Administered 2021-04-23: 25 mg via INTRAVENOUS
  Filled 2021-04-23 (×2): qty 1

## 2021-04-23 MED ORDER — ZINC SULFATE 220 (50 ZN) MG PO CAPS
220.0000 mg | ORAL_CAPSULE | Freq: Every day | ORAL | Status: DC
  Administered 2021-04-24 – 2021-05-01 (×8): 220 mg via ORAL
  Filled 2021-04-23 (×9): qty 1

## 2021-04-23 MED ORDER — SODIUM CHLORIDE 0.9% FLUSH: 9.0000 mL | INTRAVENOUS | Status: DC | PRN

## 2021-04-23 MED ORDER — SODIUM CHLORIDE 0.9 % IV SOLN: 12.5000 mg | Freq: Three times a day (TID) | INTRAVENOUS | Status: DC | PRN

## 2021-04-23 MED ORDER — RENA-VITE PO TABS
1.0000 | ORAL_TABLET | Freq: Every day | ORAL | Status: DC
  Administered 2021-04-24 – 2021-05-01 (×7): 1 via ORAL
  Filled 2021-04-23 (×9): qty 1

## 2021-04-23 MED ORDER — VANCOMYCIN HCL IN DEXTROSE 1-5 GM/200ML-% IV SOLN
1000.0000 mg | INTRAVENOUS | Status: AC
Start: 2021-04-24 — End: 2021-04-24
  Administered 2021-04-24: 1000 mg via INTRAVENOUS
  Filled 2021-04-23 (×2): qty 200

## 2021-04-23 MED ORDER — ENSURE ENLIVE PO LIQD: 237.0000 mL | Freq: Three times a day (TID) | ORAL | Status: DC

## 2021-04-23 MED ORDER — HYDROMORPHONE HCL 1 MG/ML IJ SOLN
1.0000 mg | Freq: Once | INTRAMUSCULAR | Status: AC
  Administered 2021-04-23: 1 mg via INTRAVENOUS
  Filled 2021-04-23: qty 1

## 2021-04-23 MED ORDER — NALOXONE HCL 0.4 MG/ML IJ SOLN: 0.4000 mg | INTRAMUSCULAR | Status: DC | PRN

## 2021-04-23 MED ORDER — ASCORBIC ACID 500 MG PO TABS
250.0000 mg | ORAL_TABLET | Freq: Two times a day (BID) | ORAL | Status: DC
  Administered 2021-04-24 – 2021-05-01 (×15): 250 mg via ORAL
  Filled 2021-04-23 (×17): qty 1

## 2021-04-23 NOTE — Progress Notes (Addendum)
?[facility] ?Progress Note  ? ?Subjective:   Underwent AKA yesterday.  Experienced hypotension requiring pressors but was able to complete the procedure.  Now off pressors this morning and blood pressure much more appropriate.  Patient having some nausea and shortness of breath but otherwise denies any issues this morning ? ?Objective ?Vitals:  ? 04/23/21 0500 04/23/21 0600 04/23/21 0700 04/23/21 0800  ?BP:      ?Pulse: (!) 110 (!) 112 (!) 111 (!) 111  ?Resp:      ?Temp:    (!) 96.7 ?F (35.9 ?C)  ?TempSrc:    Axillary  ?SpO2: 94% 95% 95% 95%  ?Weight:      ?Height:      ? ?Physical Exam ?General appearance: Chronically ill-appearing, lying in bed, no distress ?Resp: Bilateral chest rise with no increased work of breathing ?Cardio: regular rate and rhythm ?GI: soft, non-tender ?Extremities: right bka and left aka (w/ wound vac) ?  ? ?Additional Objective ?Labs: ?Basic Metabolic Panel: ?Recent Labs  ?Lab 04/22/21 ?0129 04/22/21 ?1736 04/23/21 ?1245  ?NA 133* 132* 130*  ?K 3.9 3.8 4.0  ?CL 97* 97* 97*  ?CO2 22 21* 18*  ?GLUCOSE 67* 106* 145*  ?BUN 21* 23* 23*  ?CREATININE 4.28* 4.81* 4.93*  ?CALCIUM 7.8* 8.4* 8.4*  ?PHOS 4.8* 5.8*  --   ? ?Liver Function Tests: ?Recent Labs  ?Lab 04/20/21 ?1234 04/22/21 ?0129 04/22/21 ?1736  ?AST 24  --   --   ?ALT 17  --   --   ?ALKPHOS 106  --   --   ?BILITOT 1.6*  --   --   ?PROT 5.6*  --   --   ?ALBUMIN 2.1* 2.1* 2.2*  ? ?No results for input(s): LIPASE, AMYLASE in the last 168 hours. ?CBC: ?Recent Labs  ?Lab 04/20/21 ?1234 04/21/21 ?0110 04/22/21 ?0129 04/22/21 ?1736 04/23/21 ?0355  ?WBC 9.7 10.1 10.7* 10.2 12.6*  ?NEUTROABS 8.2*  --  8.6*  --  12.0*  ?HGB 11.6* 12.4* 11.7* 10.2* 10.4*  ?HCT 36.4* 36.4* 34.9* 30.7* 31.0*  ?MCV 104.9* 103.1* 102.3* 102.7* 103.7*  ?PLT 165 127* 125* 142* 147*  ? ?Blood Culture ?   ?Component Value Date/Time  ? SDES BLOOD RIGHT FOREARM 04/20/2021 1322  ? SPECREQUEST  04/20/2021 1322  ?  BOTTLES DRAWN AEROBIC AND ANAEROBIC Blood  Culture adequate volume  ? CULT  04/20/2021 1322  ?  NO GROWTH 3 DAYS ?Performed at [facility], [place] [address], [place], [place] [address] ?  ? REPTSTATUS PENDING 04/20/2021 1322  ? ? ?Cardiac Enzymes: ?No results for input(s): CKTOTAL, CKMB, CKMBINDEX, TROPONINI in the last 168 hours. ?CBG: ?Recent Labs  ?Lab 04/22/21 ?1555 04/22/21 ?2005 04/23/21 ?0015 04/23/21 ?0407 04/23/21 ?3382  ?GLUCAP 86 127* 153* 140* 112*  ? ?Iron Studies: No results for input(s): IRON, TIBC, TRANSFERRIN, FERRITIN in the last 72 hours. ?@lablastinr3 @ ?Studies/Results: ?[place] EKG SITE RITE ? ?Result Date: 04/23/2021 ?If [employer] not attached, placement could not be confirmed due to current cardiac rhythm.  ?Medications: ? ceFEPime (MAXIPIME) IV Stopped (04/23/21 0033)  ? dextrose Stopped (04/23/21 0015)  ? heparin 900 Units/hr (04/23/21 0600)  ? metronidazole Stopped (04/23/21 0135)  ? promethazine (PHENERGAN) injection (IM or IVPB) Stopped (04/23/21 0325)  ? vancomycin Stopped (04/21/21 1950)  ? ? amiodarone  200 mg Oral Daily  ? atorvastatin  10 mg Oral Q M,W,F  ? calcium acetate  667 mg Oral TID WC  ? cholecalciferol  1,000 Units Oral  Q M,W,F  ? HYDROmorphone   Intravenous Q4H  ? mupirocin ointment  1 application. Nasal BID  ? pantoprazole  40 mg Oral Daily  ? sodium zirconium cyclosilicate  10 g Oral Once  ? ? ?Dialysis Orders:  [name] M[facility] ? 4h 25min  95 kg   1K/2.25 bath  Hep 1000+ 500u/hr  LUE AVG  300/500   ?- prosthetic weighs 1.6 kg ? - Epogen 1000   Units IV/HD   ?  ?Assessment/Plan ?**stump infection:  broad spectrum antibiotics.  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  Complicated by hypotension but now resolved.  Management per vascular ?  ?**ESRD on HD:  HD per usual schedule MWF - heparin per outpt held due to systemic heparin gtt for periop a fib anticoag.  Hold HD today and plan for HD tomorrow given recent hemodynamic instability.  Resume MWF next week and ?  ?**Anemia:  Hb in 10-11, follow.  On low dose  epogen outpt.  ?  ?**Nutrition: supplements ?  ?**HL: statin   ? ?**PAF: heparin gtt bridgin eliquis, mgmt per primary ? ?Given his limited options for Access I do not think a PICC line is appropriate for this patient ? ? ?

## 2021-04-23 NOTE — Progress Notes (Signed)
Pharmacy Antibiotic Note ? ?Brendan Campbell is a 51 y.o. male admitted on 04/20/2021 presenting with  infection from [place] now s/p AKA  Pharmacy has been consulted for vancomycin and cefepime dosing.  He is noted with ESRD on HD ?-WBC= 12.6 ?-cultures- ngtd ?-last HD 3/15. Noted plans for HD on Saturday instead of today ? ?Plan: ?-Vancomycin 1000mg  IV with HD 3/18 then continue 1000mg  IV with HD MWF ?-Cefepime  1g IV q 24h ?-Monitor HD schedule, Cx, VVS plans, and clinical progression to narrow ?-Vancomycin levels as indicated ? ?Height: 5\' 10"  (177.8 cm) (pre-amputation height) ?Weight: 96.3 kg (212 lb 4.9 oz) ?IBW/kg (Calculated) : 73 ? ?Temp (24hrs), Avg:97.6 ?F (36.4 ?C), Min:96.5 ?F (35.8 ?C), Max:98.7 ?F (37.1 ?C) ? ?Recent Labs  ?Lab 04/20/21 ?1234 04/20/21 ?1314 04/21/21 ?0110 04/22/21 ?0129 04/22/21 ?1736 04/23/21 ?0355  ?WBC 9.7  --  10.1 10.7* 10.2 12.6*  ?CREATININE 6.13*  --  6.02* 4.28* 4.81* 4.93*  ?LATICACIDVEN  --  1.7  --   --   --   --   ? ?  ?Estimated Creatinine Clearance: 20.9 mL/min (A) (by C-G formula based on SCr of 4.93 mg/dL (H)).   ? ?Allergies  ?Allergen Reactions  ? Tape Other (See Comments)  ?  Pulls off the skin  ? Chlorhexidine Gluconate [Chlorhexidine] Rash  ? ? ?Hildred Laser, PharmD ?Clinical Pharmacist ?**Pharmacist phone directory can now be found on [email or web address] ([email or web address]).  Listed under [place]. ? ? ?

## 2021-04-23 NOTE — Progress Notes (Signed)
Initial Nutrition Assessment ? ?DOCUMENTATION CODES:  ? ?Not applicable ? ?INTERVENTION:  ?Renal MVI daily ? ?Check Vitamin C, A, and zinc lab values ? ?Vitamin C 250 mg BID x 30 days  ? ?Zinc 220 mg daily x 14 days  ? ?Ensure Enlive po TID, each supplement provides 350 kcal and 20 grams of protein. ? ?Liberalize diet to regular with 1200 ml fluid restriction  ? ?NUTRITION DIAGNOSIS:  ? ?Increased nutrient needs related to chronic illness, wound healing, post-op healing (ESRD on HD) as evidenced by estimated needs. ? ? ?GOAL:  ? ?Patient will meet greater than or equal to 90% of their needs ? ? ?MONITOR:  ? ?PO intake, Supplement acceptance, Labs, Weight trends ? ?REASON FOR ASSESSMENT:  ? ?Rounds (wounds (AKA planned)) ?  ? ?ASSESSMENT:  ? ?51 y.o. male with history of PAF, CHF, DM type 2, diabetic neuropathy, ESRD on HD MWF, PTSD, anemia, and S/P bilateral BKAs presented to ED from vascular appointment with worsening left BKA stump infection ? ?3/16 - OR, L AKA, R stump washout  ? ?Pt's last HD was on 3/15 with 700 mL UF ? ?Went to visit with pt this morning and pt declining dietetic intern visit at this time due to pt not wanting to talk at this time. Visited back with pt later in the afternoon and curtains were drawn. Per RN, they were giving pt a bath at this time and RN reports that pt may not want to talk at this time. Due to multiple failed attempts to speak with pt, dietetic intern to recommend Ensure supplement TID due to pt's poor PO intake prior to admission, per chart review. Dietetic intern to recommend liberalizing pt's diet to promote PO intake as much as possible. Recommend renal MVI daily.  ? ?Spoke with RN via secure chat and per RN, pt has refused all meals today, as well as all oral medications. ? ?PO intake per chart review: 50-75% prior to pt being NPO on 3/16. No meal completions documented for today.  ? ?Unable to perform NFPE at this time. Pt will need NFPE at follow-up.  ? ?Admit wt: 93 kg   ?Current wt: 96.3 kg  ?EDW: 95 kg  ? ?Labs reviewed and include: CBG's: 67-153 x 24 hours, A1C: 5.3, Sodium: 130 (L), Phosphorus: 5.8 (H), Magnesium: 2.2, Potassium: 4.0, Corrected calcium: 9.8, BUN: 23 (H), Creatinine: 4.93 (H) ? ?Medications reviewed and include:  ? calcium acetate  667 mg Oral TID WC  ? cholecalciferol  1,000 Units Oral Q M,W,F  ? pantoprazole  40 mg Oral Daily  ? sodium zirconium cyclosilicate  10 g Oral Once  ? ?Continuous Infusions: ? Vancomycin    ? ? ? ?NUTRITION - FOCUSED PHYSICAL EXAM: Unable to assess at this time.  ? ? ? ?Diet Order:   ?Diet Order   ? ?       ?  Diet renal with fluid restriction Fluid restriction: 1200 mL Fluid; Room service appropriate? Yes; Fluid consistency: Thin  Diet effective now       ?  ? ?  ?  ? ?  ? ? ?EDUCATION NEEDS:  ? ?Not appropriate for education at this time ? ?Skin:  Skin Assessment: Skin Integrity Issues: ?Skin Integrity Issues:: Incisions ?Incisions: (closed) - leg right; leg left ? ?Last BM:  3/13 ? ?Height:  ? ?Ht Readings from Last 1 Encounters:  ?04/22/21 5\' 10"  (1.778 m)  ? ? ?Weight:  ? ?Wt Readings from Last 1 Encounters:  ?  04/23/21 96.3 kg  ? ? ?Ideal Body Weight:  63.5 kg (adjusted by 15.9% for AKA and BKA) ? ?BMI:  Body mass index is 30.46 kg/m?. ? ?Estimated Nutritional Needs:  ? ?Kcal:  2300-2500 kcal/d ? ?Protein:  120-150 g/d ? ?Fluid:  1L + UOP ? ? ? ?Maryruth Hancock, Dietetic Intern ?04/23/2021 3:23 PM ?

## 2021-04-23 NOTE — Progress Notes (Signed)
Pt receives out-pt HD at [facility] on MWF. Pt has an 11:30 chair time and uses [facility] for transportation services. Will assist as needed. ? ?Melven Sartorius ?Renal Navigator ?[phone] ?

## 2021-04-23 NOTE — Progress Notes (Signed)
VAST consulted to obtain 2 PIV's so that TL femoral CVC can be removed. Patient with left arm restricted d/t dialysis access. Right arm edematous with numerous scabs and severe bruising to anterior forearm; open oozing wound on right hand.  ?Right arm not appropriate for IV access at this time. However, assessed right arm with ultrasound for possible vessels to cannulate at a later time. Assessment difficult d/t fluid in tissues; vessels visualized are too small for cannulation.  ?Best practice would suggest an IJ CVC be placed for this patient at this time if possible. ICU RN notified.  ?

## 2021-04-23 NOTE — Progress Notes (Signed)
ANTICOAGULATION CONSULT NOTE  ? ?Pharmacy Consult for heparin ?Indication: atrial fibrillation ? ?Allergies  ?Allergen Reactions  ? Tape Other (See Comments)  ?  Pulls off the skin  ? Chlorhexidine Gluconate [Chlorhexidine] Rash  ? ? ?Patient Measurements: ?Height: 5\' 10"  (177.8 cm) (pre-amputation height) ?Weight: 96.3 kg (212 lb 4.9 oz) ?IBW/kg (Calculated) : 73 ?Heparin Dosing Weight: 93 kg due to non-obese when using pre-amputation height of 6' ? ?Vital Signs: ?Temp: 96.7 ?F (35.9 ?C) (03/17 0800) ?Temp Source: Axillary (03/17 0800) ?Pulse Rate: 111 (03/17 0800) ? ?Labs: ?Recent Labs  ?  04/21/21 ?0110 04/21/21 ?1316 04/22/21 ?0129 04/22/21 ?1736 04/22/21 ?2142 04/23/21 ?0355 04/23/21 ?0400  ?HGB 12.4*  --  11.7* 10.2*  --  10.4*  --   ?HCT 36.4*  --  34.9* 30.7*  --  31.0*  --   ?PLT 127*  --  125* 142*  --  147*  --   ?APTT >200* 182*  --   --  100*  --   --   ?HEPARINUNFRC >1.10* 0.91*  --   --  0.38  --  0.43  ?CREATININE 6.02*  --  4.28* 4.81*  --  4.93*  --   ? ? ? ?Estimated Creatinine Clearance: 20.9 mL/min (A) (by C-G formula based on SCr of 4.93 mg/dL (H)). ? ?Medications:  ?Patient taking apixaban 5 mg BID PTA ?Patient reported the last dose of apixaban was 3/13 in the evening ? ?Assessment: ?51  y.o. M on apixaban PTA for afib. Plan for surgery 3/16 so held apixaban and bridging with heparin. He is now s/p L AKA on 3/16 ?-heparin level at goal ?-hg= 10.4 ? ?Goal of Therapy:  ?Heparin level 0.3-0.7 units/mL ?Monitor platelets by anticoagulation protocol: Yes ?  ?Plan:   ?Continue heparin at 900 units/hr ?Monitor daily heparin level, CBC ?Monitor for signs/symptoms of bleeding  ? ?Hildred Laser, PharmD ?Clinical Pharmacist ?**Pharmacist phone directory can now be found on [email or web address] ([email or web address]).  Listed under [place]. ? ? ? ? ?Please check AMION for all [place] phone numbers ?After 10:00 PM, call [place] [phone] ? ? ? ? ? ?

## 2021-04-23 NOTE — Progress Notes (Addendum)
[facility] [place] ? ?Subjective  - Alert and oriented  ? ? ?Objective ?(S) (!) 71/58 ?(!) 111 ?98.2 ?F (36.8 ?C) (Axillary) ?12 ?95% ? ?Intake/Output Summary (Last 24 hours) at 04/23/2021 0703 ?Last data filed at 04/23/2021 0600 ?Gross per 24 hour  ?Intake 2260.83 ml  ?Output 300 ml  ?Net 1960.83 ml  ? ? ?B AKA dressings clean and dry ?Lungs non labored breathing ?Heart tachy 111 known Afib ? ? ? ? ?Assessment/Planning: ?POD # 1 revision left AKA from BKA, debridement right AKA ? ?Will maintain left AKA dressing over the weekend.  Right AKA wet to dry daily ?Will transfer back to 6N ?Stable BP over night, no cardiac events over night ? ? ?Brendan Campbell ?04/23/2021 ?7:03 AM ?-- ? ?VASCULAR STAFF ADDENDUM: ?I have independently interviewed and examined the patient. ?I agree with the above.  ?Will transition to PCA due to pain control issues.  ?Daily dressing changes to the right BKA - Appreciate wound care consult - may benefit from Aquacel to right BKA site. Wound was non-healing due to poor hygiene and and chronic contracture. In the OR there was animal hair throughout the wound. ? ?The AKA dressing will remain in place until Monday at a minimum to prevent wound soiling with feces and urine.  ? ?[name], MD ?[facility] ?Office Phone Number: [phone] ?04/23/2021 7:51 AM ? ? ? ?Laboratory ?Lab Results: ?Recent Labs  ?  04/22/21 ?1736 04/23/21 ?0355  ?WBC 10.2 12.6*  ?HGB 10.2* 10.4*  ?HCT 30.7* 31.0*  ?PLT 142* 147*  ? ?BMET ?Recent Labs  ?  04/22/21 ?1736 04/23/21 ?0355  ?NA 132* 130*  ?K 3.8 4.0  ?CL 97* 97*  ?CO2 21* 18*  ?GLUCOSE 106* 145*  ?BUN 23* 23*  ?CREATININE 4.81* 4.93*  ?CALCIUM 8.4* 8.4*  ? ? ?COAG ?Lab Results  ?Component Value Date  ? INR 1.4 (H) 02/13/2021  ? INR 1.4 (H) 02/12/2021  ? INR 1.1 02/04/2021  ? ?No results found for: PTT ? ? ? ?

## 2021-04-23 NOTE — Consult Note (Signed)
WOC Nurse Consult Note: ?Patient receiving care in [place] ?Reason for Consult: RBKA wound ?Wound type: RBKA wound debridement by Dr. Levie Heritage ?Pressure Injury POA: NA ?Wound bed: 100% yellow slough. Friable. Black bordering eschar was debrided ?Drainage (amount, consistency, odor) Serous ?Periwound: erythmatous ?Dressing procedure/placement/frequency: ?Clean the RBKA wound with NS. Pat Dry then apply a thin layer of MediHoney with a cotton tipped swab. Cover with 4 x 4s and wrap with Kerlix and Ace wrap. Apply daily.  ? ?Monitor the wound area(s) for worsening of condition such as: ?Signs/symptoms of infection, increase in size, development of or worsening of odor, ?development of pain, or increased pain at the affected locations.   ?Notify the medical team if any of these develop. ? ?Thank you for the consult. [place] nurse will not follow at this time.   ?Please re-consult the [place] team if needed. ? ?Cathlean Marseilles. Tamala Julian, MSN, RN, CMSRN, AGCNS, WTA ?Wound Treatment Associate ?Pager [phone]   ? ? ?  ?

## 2021-04-23 NOTE — Progress Notes (Signed)
?PROGRESS NOTE ? ?Brendan Campbell  ?DOB: 1970-11-03  ?PCP: Monico Blitz, MD ?JOA:[id number]  ?DOA: 04/20/2021 ? LOS: 3 days  ?Hospital Day: 4 ? ?Brief narrative: ?Brendan Campbell is a 51 y.o. 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 ? ?Patient underwent left leg BKA on 02/16/2021.  In February, he started to complain of progressive pain at the left BKA stump site.  Staples were removed.  His pain continues to increase and he also started having foul-smelling discharge.  ?On 3/14, he was seen by vascular surgery in the office.  There was concern of deep tissue infection and hence patient was sent to the ED.   ?Admitted to hospitalist service ?3/16, patient underwent revision of left AKA from BKA, debridement right AKA. ?Because of intraoperative hypotension, patient required pressors and was taken to ICU but soon after the anesthesia effect wore off, patient's blood pressure stabilized and hence returned to [facility] service. ?  ?Subjective: ?Patient was seen and examined this morning in 2H.  ?Per RN, he was complaining of excessive pain earlier, probably tachycardiac from that.  Pain controlled at the time of my evaluation. ? ? ?Principal Problem: ?  Osteomyelitis ([place]) ?Active Problems: ?  CHF (congestive heart failure) ([place]) ?  Osteomyelitis of ankle or foot, acute, left ([place]) ?  ?Assessment and Plan: ?Left BKA stump infected ulcer ?History of left BKA in January 2023 ?-Presented with progressively worsening infection since then associated with pain, foul-smelling discharge ?-3/16, underwent revision of left AKA from BKA, debridement right AKA. ?-Vascular surgery follow-up appreciated.  Pain control with Dilaudid PCA pump ?-Wound care per vascular surgery. ?-Currently remains on broad-spectrum antibiotics. ? ?HypOtension ?-Because of intraoperative hypotension, patient required pressors  and was taken to ICU but soon after the anesthesia effect wore off, patient's blood pressure stabilized and hence returned to [facility] service. ? ?ESRD HD MWF ?Hyperkalemia ?-Nephrology following.  Potassium level improved after dialysis initiation. ?-HD per usual schedule. ? ?Chronic combined systolic and diastolic CHF  ?-Chest x-ray on admission did not show acute infiltrates or signs of fluid overload.   ?-Currently euvolemic. ?-I do not see any CHF/blood pressure medicines in his list of home meds ? ?Paroxysmal A-fib ?-Continue amiodarone ?-Eliquis on hold. Currently on heparin drip ?  ?Type 2 diabetes mellitus with hypOglycemia ?-A1c 5.3 on 3/14 ?-While n.p.o. for surgery on 3/16, patient's blood sugar level dropped down to 60s.  I started him on dextrose drip.  Blood sugar level improved.  Oxygen was stopped last night. ?-Currently on renal diet.  Continue to monitor blood sugar level. ?Recent Labs  ?Lab 04/22/21 ?1555 04/22/21 ?2005 04/23/21 ?0015 04/23/21 ?0407 04/23/21 ?6010  ?GLUCAP 86 127* 153* 140* 112*  ?  ?Peripheral artery disease ?s/p prior bilateral BKA ?-PTA, patient was on aspirin, Eliquis, Lipitor.  ?-Eliquis and aspirin on hold.  Currently on heparin drip.  Continue statin ?  ?Anemia secondary to CKD ?-H&H stable. Continue systemic anticoagulation. ?-Continue to monitor ?Recent Labs  ?  07/20/20 ?0248 07/21/20 ?0532 07/21/20 ?9323 07/28/20 ?5573 08/28/20 ?1131 02/04/21 ?0727 02/05/21 ?0518 02/15/21 ?2202 02/15/21 ?5427 04/20/21 ?1234 04/21/21 ?0110 04/22/21 ?0129 04/22/21 ?1736 04/23/21 ?0355  ?HGB 10.4*  --    < >  --    < >  --    < > 9.5*   < > 11.6* 12.4* 11.7* 10.2* 10.4*  ?MCV 106.2*  --    < >  --    < >  --    < >  104.7*   < > 104.9* 103.1* 102.3* 102.7* 103.7*  ?[id number]  --   --   --  450  --  757  --   --   --   --   --   --   --   --   ?FOLATE  --   --   --  10.9  --  10.3  --   --   --   --   --   --   --   --   ?FERRITIN 1,303* 1,848*  --   --   --   --   --  860*  --   --   --   --    --   --   ?TIBC  --   --   --   --   --   --   --  192*  --   --   --   --   --   --   ?IRON  --   --   --   --   --   --   --  41*  --   --   --   --   --   --   ? < > = values in this interval not displayed.  ? ?Goals of care ?  Code Status: Full Code  ? ? ?Mobility: Bilateral BKA status ? ?Nutritional status:  ?Body mass index is 30.46 kg/m?.  ?  ?  ? ? ? ? ?Diet:  ?Diet Order   ? ?       ?  Diet renal with fluid restriction Fluid restriction: 1200 mL Fluid; Room service appropriate? Yes; Fluid consistency: Thin  Diet effective now       ?  ? ?  ?  ? ?  ? ? ?DVT prophylaxis: Heparin drip ? ?  ?Antimicrobials: Cefepime Flagyl vancomycin ?Fluid: None ?Consultants: Nephrology, vascular surgery ?Family Communication: None at bedside ? ?Status is: Inpatient ? ?Continue in-hospital care because: POD1 ?Level of care: Med-Surg  ? ?Dispo: The patient is from: Home ?             Anticipated d/c is to: Pending clinical course ?             Patient currently is not medically stable to d/c. ?  Difficult to place patient No ? ? ? ? ?Infusions:  ? ceFEPime (MAXIPIME) IV Stopped (04/23/21 0033)  ? dextrose Stopped (04/23/21 0015)  ? heparin 900 Units/hr (04/23/21 0600)  ? metronidazole Stopped (04/23/21 0135)  ? promethazine (PHENERGAN) injection (IM or IVPB) Stopped (04/23/21 0325)  ? vancomycin Stopped (04/21/21 1950)  ? ? ?Scheduled Meds: ? amiodarone  200 mg Oral Daily  ? atorvastatin  10 mg Oral Q M,W,F  ? calcium acetate  667 mg Oral TID WC  ? cholecalciferol  1,000 Units Oral Q M,W,F  ? HYDROmorphone   Intravenous Q4H  ? mupirocin ointment  1 application. Nasal BID  ? pantoprazole  40 mg Oral Daily  ? sodium zirconium cyclosilicate  10 g Oral Once  ? ? ?PRN meds: ?acetaminophen, albuterol, bisacodyl, butalbital-acetaminophen-caffeine, diphenhydrAMINE **OR** [DISCONTINUED] diphenhydrAMINE, diphenhydrAMINE, hydrALAZINE, naloxone **AND** sodium chloride flush, ondansetron (ZOFRAN) IV, oxyCODONE, promethazine (PHENERGAN)  injection (IM or IVPB), senna-docusate, tiZANidine  ? ?Antimicrobials: ?Anti-infectives (From admission, onward)  ? ? Start     Dose/Rate Route Frequency Ordered Stop  ? 04/22/21 1512  ceFAZolin 1 g / gentamicin 80 mg in NS 500 mL  surgical irrigation  Status:  Discontinued       ?   As needed 04/22/21 1512 04/22/21 1529  ? 04/21/21 1400  ceFEPIme (MAXIPIME) 1 g in sodium chloride 0.9 % 100 mL IVPB       ? 1 g ?200 mL/hr over 30 Minutes Intravenous Every 24 hours 04/20/21 1322    ? 04/21/21 1200  vancomycin (VANCOCIN) IVPB 1000 mg/200 mL premix       ? 1,000 mg ?200 mL/hr over 60 Minutes Intravenous Every M-W-F (Hemodialysis) 04/20/21 1322    ? 04/20/21 1330  metroNIDAZOLE (FLAGYL) IVPB 500 mg       ? 500 mg ?100 mL/hr over 60 Minutes Intravenous Every 12 hours 04/20/21 1317    ? 04/20/21 1330  vancomycin (VANCOREADY) IVPB 2000 mg/400 mL       ? 2,000 mg ?200 mL/hr over 120 Minutes Intravenous  Once 04/20/21 1319 04/20/21 1430  ? 04/20/21 1330  ceFEPIme (MAXIPIME) 2 g in sodium chloride 0.9 % 100 mL IVPB       ? 2 g ?200 mL/hr over 30 Minutes Intravenous  Once 04/20/21 1319 04/20/21 1435  ? ?  ? ? ?Objective: ?Vitals:  ? 04/23/21 0700 04/23/21 0800  ?BP:    ?Pulse: (!) 111 (!) 111  ?Resp:    ?Temp:  (!) 96.7 ?F (35.9 ?C)  ?SpO2: 95% 95%  ? ? ?Intake/Output Summary (Last 24 hours) at 04/23/2021 1039 ?Last data filed at 04/23/2021 0600 ?Gross per 24 hour  ?Intake 1759.03 ml  ?Output 300 ml  ?Net 1459.03 ml  ? ?Filed Weights  ? 04/21/21 1625 04/21/21 2000 04/23/21 0449  ?Weight: 95 kg 94.1 kg 96.3 kg  ? ?Weight change: 1.3 kg ?Body mass index is 30.46 kg/m?.  ? ?Physical Exam: ?General exam: Pleasant, middle-aged Caucasian male.  Chronically sick looking.   Not in distress at the time of my evaluation ?Skin: No rashes, lesions or ulcers. ?HEENT: Atraumatic, normocephalic, no obvious bleeding ?Lungs: Clear to auscultation bilaterally ?CVS: Slightly tachycardic, regular rhythm, no murmur  ?GI/Abd soft, nontender,  nondistended, bowel sound present ?CNS: Alert, awake, oriented to place and person ?Psychiatry: Mood appropriate ?Extremities: Bilateral AKA status.  Bandages on his stumps ? ?Data Review: I have personally review

## 2021-04-24 DIAGNOSIS — M86252 Subacute osteomyelitis, left femur: Secondary | ICD-10-CM | POA: Diagnosis not present

## 2021-04-24 DIAGNOSIS — M86172 Other acute osteomyelitis, left ankle and foot: Secondary | ICD-10-CM

## 2021-04-24 DIAGNOSIS — T8140XA Infection following a procedure, unspecified, initial encounter: Secondary | ICD-10-CM | POA: Diagnosis not present

## 2021-04-24 LAB — BASIC METABOLIC PANEL
Anion gap: 12 (ref 5–15)
BUN: 28 mg/dL — ABNORMAL HIGH (ref 6–20)
CO2: 22 mmol/L (ref 22–32)
Calcium: 8.4 mg/dL — ABNORMAL LOW (ref 8.9–10.3)
Chloride: 98 mmol/L (ref 98–111)
Creatinine, Ser: 5.36 mg/dL — ABNORMAL HIGH (ref 0.61–1.24)
GFR, Estimated: 12 mL/min — ABNORMAL LOW (ref >60)
Glucose, Bld: 71 mg/dL (ref 70–99)
Potassium: 4.2 mmol/L (ref 3.5–5.1)
Sodium: 132 mmol/L — ABNORMAL LOW (ref 135–145)

## 2021-04-24 LAB — GLUCOSE, CAPILLARY
Glucose-Capillary: 69 mg/dL — ABNORMAL LOW (ref 70–99)
Glucose-Capillary: 70 mg/dL (ref 70–99)
Glucose-Capillary: 71 mg/dL (ref 70–99)
Glucose-Capillary: 76 mg/dL (ref 70–99)
Glucose-Capillary: 80 mg/dL (ref 70–99)
Glucose-Capillary: 83 mg/dL (ref 70–99)
Glucose-Capillary: 88 mg/dL (ref 70–99)

## 2021-04-24 LAB — CBC WITH DIFFERENTIAL/PLATELET
Abs Immature Granulocytes: 0.11 10*3/uL — ABNORMAL HIGH (ref 0.00–0.07)
Basophils Absolute: 0 10*3/uL (ref 0.0–0.1)
Basophils Relative: 0 %
Eosinophils Absolute: 0 10*3/uL (ref 0.0–0.5)
Eosinophils Relative: 0 %
HCT: 30.6 % — ABNORMAL LOW (ref 39.0–52.0)
Hemoglobin: 10.2 g/dL — ABNORMAL LOW (ref 13.0–17.0)
Immature Granulocytes: 1 %
Lymphocytes Relative: 5 %
Lymphs Abs: 0.8 10*3/uL (ref 0.7–4.0)
MCH: 34.8 pg — ABNORMAL HIGH (ref 26.0–34.0)
MCHC: 33.3 g/dL (ref 30.0–36.0)
MCV: 104.4 fL — ABNORMAL HIGH (ref 80.0–100.0)
Monocytes Absolute: 0.6 10*3/uL (ref 0.1–1.0)
Monocytes Relative: 3 %
Neutro Abs: 14.9 10*3/uL — ABNORMAL HIGH (ref 1.7–7.7)
Neutrophils Relative %: 91 %
Platelets: 151 10*3/uL (ref 150–400)
RBC: 2.93 MIL/uL — ABNORMAL LOW (ref 4.22–5.81)
RDW: 18.9 % — ABNORMAL HIGH (ref 11.5–15.5)
WBC: 16.4 10*3/uL — ABNORMAL HIGH (ref 4.0–10.5)
nRBC: 0.2 % (ref 0.0–0.2)

## 2021-04-24 LAB — HEPARIN LEVEL (UNFRACTIONATED): Heparin Unfractionated: 0.38 IU/mL (ref 0.30–0.70)

## 2021-04-24 LAB — HEPATITIS B SURFACE ANTIGEN: Hepatitis B Surface Ag: NONREACTIVE

## 2021-04-24 LAB — HEPATITIS B SURFACE ANTIBODY,QUALITATIVE: Hep B S Ab: REACTIVE — AB

## 2021-04-24 MED ORDER — HEPARIN SODIUM (PORCINE) 1000 UNIT/ML DIALYSIS
1000.0000 [IU] | INTRAMUSCULAR | Status: DC | PRN
  Filled 2021-04-24: qty 1

## 2021-04-24 MED ORDER — ALTEPLASE 2 MG IJ SOLR
2.0000 mg | Freq: Once | INTRAMUSCULAR | Status: DC | PRN
  Filled 2021-04-24: qty 2

## 2021-04-24 MED ORDER — NEPRO/CARBSTEADY PO LIQD: 237.0000 mL | Freq: Three times a day (TID) | ORAL | Status: DC

## 2021-04-24 MED ORDER — SODIUM CHLORIDE 0.9 % IV SOLN
100.0000 mL | INTRAVENOUS | Status: DC | PRN
  Administered 2021-04-25: 100 mL via INTRAVENOUS

## 2021-04-24 MED ORDER — LIDOCAINE-PRILOCAINE 2.5-2.5 % EX CREA
1.0000 "application " | TOPICAL_CREAM | CUTANEOUS | Status: DC | PRN
  Filled 2021-04-24: qty 5

## 2021-04-24 MED ORDER — SODIUM CHLORIDE 0.9 % IV SOLN: 100.0000 mL | INTRAVENOUS | Status: DC | PRN

## 2021-04-24 MED ORDER — LIDOCAINE HCL (PF) 1 % IJ SOLN: 5.0000 mL | INTRAMUSCULAR | Status: DC | PRN

## 2021-04-24 MED ORDER — ALBUMIN HUMAN 25 % IV SOLN
12.5000 g | Freq: Once | INTRAVENOUS | Status: AC
  Administered 2021-04-24: 12.5 g via INTRAVENOUS
  Filled 2021-04-24: qty 50

## 2021-04-24 MED ORDER — PENTAFLUOROPROP-TETRAFLUOROETH EX AERO: 1.0000 "application " | INHALATION_SPRAY | CUTANEOUS | Status: DC | PRN

## 2021-04-24 NOTE — Progress Notes (Signed)
?[facility] ?Progress Note  ? ?Subjective:   Persistent mild hypotension.  Having some scrotal edema which frustrates the patient.  Planning for dialysis today ? ?Objective ?Vitals:  ? 04/24/21 0500 04/24/21 0600 04/24/21 0700 04/24/21 0819  ?BP: 103/78 106/80 112/85   ?Pulse: 98 92 88   ?Resp: 10   17  ?Temp:      ?TempSrc:      ?SpO2: 100% 100% 100% 92%  ?Weight: 96.7 kg     ?Height:      ? ?Physical Exam ?General appearance: Chronically ill-appearing, lying in bed, no distress ?Resp: Bilateral chest rise with no increased work of breathing ?Cardio: regular rate and rhythm ?GI: soft, non-tender, scrotal edema present ?Extremities: right bka and left aka (w/ wound vac), some lower extremity edema present bilaterally ?  ? ?Additional Objective ?Labs: ?Basic Metabolic Panel: ?Recent Labs  ?Lab 04/22/21 ?0129 04/22/21 ?1449 04/22/21 ?1736 04/23/21 ?0355 04/24/21 ?0420  ?NA 133*   < > 132* 130* 132*  ?K 3.9   < > 3.8 4.0 4.2  ?CL 97*  --  97* 97* 98  ?CO2 22  --  21* 18* 22  ?GLUCOSE 67*  --  106* 145* 71  ?BUN 21*  --  23* 23* 28*  ?CREATININE 4.28*  --  4.81* 4.93* 5.36*  ?CALCIUM 7.8*  --  8.4* 8.4* 8.4*  ?PHOS 4.8*  --  5.8*  --   --   ? < > = values in this interval not displayed.  ? ?Liver Function Tests: ?Recent Labs  ?Lab 04/20/21 ?1234 04/22/21 ?0129 04/22/21 ?1736  ?AST 24  --   --   ?ALT 17  --   --   ?ALKPHOS 106  --   --   ?BILITOT 1.6*  --   --   ?PROT 5.6*  --   --   ?ALBUMIN 2.1* 2.1* 2.2*  ? ?No results for input(s): LIPASE, AMYLASE in the last 168 hours. ?CBC: ?Recent Labs  ?Lab 04/21/21 ?0110 04/22/21 ?0129 04/22/21 ?1449 04/22/21 ?1736 04/23/21 ?0355 04/24/21 ?0420  ?WBC 10.1 10.7*  --  10.2 12.6* 16.4*  ?NEUTROABS  --  8.6*  --   --  12.0* 14.9*  ?HGB 12.4* 11.7*   < > 10.2* 10.4* 10.2*  ?HCT 36.4* 34.9*   < > 30.7* 31.0* 30.6*  ?MCV 103.1* 102.3*  --  102.7* 103.7* 104.4*  ?PLT 127* 125*  --  142* 147* 151  ? < > = values in this interval not displayed.  ? ?Blood Culture ?    ?Component Value Date/Time  ? SDES BLOOD RIGHT FOREARM 04/20/2021 1322  ? SPECREQUEST  04/20/2021 1322  ?  BOTTLES DRAWN AEROBIC AND ANAEROBIC Blood Culture adequate volume  ? CULT  04/20/2021 1322  ?  NO GROWTH 3 DAYS ?Performed at [facility], [place] [address] [place], [place] [address] ?  ? REPTSTATUS PENDING 04/20/2021 1322  ? ? ?Cardiac Enzymes: ?No results for input(s): CKTOTAL, CKMB, CKMBINDEX, TROPONINI in the last 168 hours. ?CBG: ?Recent Labs  ?Lab 04/23/21 ?1149 04/23/21 ?1937 04/23/21 ?2359 04/24/21 ?0408 04/24/21 ?0827  ?GLUCAP 142* 91 88 70 83  ? ?Iron Studies: No results for input(s): IRON, TIBC, TRANSFERRIN, FERRITIN in the last 72 hours. ?@lablastinr3 @ ?Studies/Results: ?[place] EKG SITE RITE ? ?Result Date: 04/23/2021 ?If [employer] not attached, placement could not be confirmed due to current cardiac rhythm.  ?Medications: ? sodium chloride    ? sodium chloride    ? albumin  human    ? ceFEPime (MAXIPIME) IV Stopped (04/23/21 2325)  ? dextrose 50 mL/hr at 04/24/21 0600  ? heparin 900 Units/hr (04/24/21 0600)  ? metronidazole Stopped (04/24/21 0154)  ? promethazine (PHENERGAN) injection (IM or IVPB) Stopped (04/23/21 0325)  ? vancomycin Stopped (04/21/21 1950)  ? vancomycin    ? ? amiodarone  200 mg Oral Daily  ? vitamin C  250 mg Oral BID  ? atorvastatin  10 mg Oral Q M,W,F  ? calcium acetate  667 mg Oral TID WC  ? cholecalciferol  1,000 Units Oral Q M,W,F  ? feeding supplement  237 mL Oral TID BM  ? HYDROmorphone   Intravenous Q4H  ? leptospermum manuka honey  1 application. Topical Daily  ? multivitamin  1 tablet Oral QHS  ? mupirocin ointment  1 application. Nasal BID  ? pantoprazole  40 mg Oral Daily  ? sodium zirconium cyclosilicate  10 g Oral Once  ? zinc sulfate  220 mg Oral Daily  ? ? ?Dialysis Orders:  [name]  on MWF ? 4h 31min  95 kg   1K/2.25 bath  Hep 1000+ 500u/hr  LUE AVG  300/500   ?- prosthetic weighs 1.6 kg ? - Epogen 1000   Units IV/HD   ?   ?Assessment/Plan ?**stump infection:  broad spectrum antibiotics.  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  Complicated by hypotension but now improved.  Management per vascular ?  ?**ESRD on HD:  HD per usual schedule MWF - heparin per outpt held due to systemic heparin gtt for periop a fib anticoag.  Held dialysis yesterday due to recent hypotension.  Plan for dialysis today.  Resume MWF next week and ?  ?**Anemia:  Hb in 10-11, follow.  On low dose epogen outpt.  ?  ?**Nutrition: supplements ?  ?**HL: statin   ? ?**PAF: heparin gtt bridgin eliquis, mgmt per primary ? ? ? ?

## 2021-04-24 NOTE — Progress Notes (Signed)
Paged Dr.P. Broadus John regarding bruising on right are spreading. Verbal order to hold heparin until further assessment tomorrow.  ?

## 2021-04-24 NOTE — Progress Notes (Signed)
ANTICOAGULATION CONSULT NOTE  ? ?Pharmacy Consult for heparin ?Indication: atrial fibrillation ? ?Allergies  ?Allergen Reactions  ? Tape Other (See Comments)  ?  Pulls off the skin  ? Chlorhexidine Gluconate [Chlorhexidine] Rash  ? ? ?Patient Measurements: ?Height: 5\' 10"  (177.8 cm) (pre-amputation height) ?Weight: 96.7 kg (213 lb 3 oz) ?IBW/kg (Calculated) : 73 ?Heparin Dosing Weight: 93 kg due to non-obese when using pre-amputation height of 6' ? ?Vital Signs: ?Temp: 97.6 ?F (36.4 ?C) (03/18 0410) ?Temp Source: Axillary (03/18 0410) ?BP: 106/80 (03/18 0600) ?Pulse Rate: 92 (03/18 0600) ? ?Labs: ?Recent Labs  ?  04/21/21 ?1316 04/22/21 ?0129 04/22/21 ?1736 04/22/21 ?2142 04/23/21 ?0355 04/23/21 ?0400 04/24/21 ?0420  ?HGB  --    < > 10.2*  --  10.4*  --  10.2*  ?HCT  --    < > 30.7*  --  31.0*  --  30.6*  ?PLT  --    < > 142*  --  147*  --  151  ?APTT 182*  --   --  100*  --   --   --   ?HEPARINUNFRC 0.91*  --   --  0.38  --  0.43 0.38  ?CREATININE  --    < > 4.81*  --  4.93*  --  5.36*  ? < > = values in this interval not displayed.  ? ? ? ?Estimated Creatinine Clearance: 19.2 mL/min (A) (by C-G formula based on SCr of 5.36 mg/dL (H)). ? ?Medications:  ?Patient taking apixaban 5 mg BID PTA ?Patient reported the last dose of apixaban was 3/13 in the evening ? ?Assessment: ?51  y.o. M on apixaban PTA for afib. Plan for surgery 3/16 so held apixaban and bridging with heparin. He is now s/p L AKA on 3/16 ? ?Heparin level therapeutic at 0.38 on 900 units/hr. CBC stable. No s/x of bleeding per RN.  ? ?Goal of Therapy:  ?Heparin level 0.3-0.7 units/mL ?Monitor platelets by anticoagulation protocol: Yes ?  ?Plan:   ?Continue heparin at 900 units/hr ?Monitor daily heparin level, CBC ?Monitor for signs/symptoms of bleeding  ? ?Cathrine Muster, PharmD ?PGY2 Cardiology Pharmacy Resident ?Phone: [phone] ?04/24/2021  7:05 AM ? ?Please check [email or web address] for unit-specific pharmacy phone numbers. ? ? ? ? ? ?

## 2021-04-24 NOTE — Progress Notes (Signed)
?PROGRESS NOTE ? ? ? ?Brendan Campbell  JSE:[id number] DOB: [birth year] DOA: 04/20/2021 ?PCP: Monico Blitz, MD  ?[place] y.o. 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  ?Patient underwent left leg BKA on 02/16/2021.  In February, he started having pain at the BKA site with some foul-smelling discharge. On 3/14, he was seen by vascular surgery in the office.  There was concern of deep tissue infection and hence patient was sent to the ED.   ?3/16, patient underwent revision of left AKA from BKA, debridement right AKA. ?Because of intraoperative hypotension, patient required pressors and was briefly admitted to ICU, subsequently BP stabilized and transferred to  [facility] service. ?-Was started on D10 drip and PCA pump ? ? ?Subjective: ?Complains of some pain at his AKA site, hungry, wants to eat ? ?Assessment & Plan: ? ?Left BKA stump infected ulcer ?History of left BKA in January 2023 ?-Presented with wound infection, very poor hygiene, abnormal hair found in wound ?-3/16, underwent left AKA, converted from BKA, debridement right BKA ?-Vascular surgery following, started on Dilaudid PCA pump yesterday ?-Continue wound care ?-Will continue broad-spectrum antibiotics today, mild leukocytosis noted, blood cultures were negative ?-Likely DC ABX in 48 hours ?-PT eval, possibly on Monday ?  ?Hypotension ?-Developed intraoperative hypotension, likely secondary to sedation, pain meds  ?-Stable off pressors, monitor with HD, might need midodrine ?  ?ESRD HD MWF ?Hyperkalemia ?-Nephrology following.   ?-Plan for dialysis today ?  ?Chronic combined systolic and diastolic CHF  ?-Volume managed with HD ?  ?Paroxysmal A-fib ?-Continue amiodarone ?-Eliquis on hold. Currently on heparin drip ?-Restart Eliquis tomorrow ?  ?Type 2 diabetes mellitus with hypOglycemia ?-A1c 5.3 on 3/14 ?-Was  started on D10 drip due to frequent hypoglycemia last 48 hours, attempt to wean off D10 drip today, add supplements ?  ?Peripheral artery disease ?s/p prior bilateral BKA ?-PTA, patient was on aspirin, Eliquis, Lipitor.  ?-Eliquis and aspirin on hold.  Currently on heparin drip.  Continue statin ?  ?Anemia secondary to CKD ?-H&H stable. Continue systemic anticoagulation. ?-Continue to monitor ?  ?DVT prophylaxis: IV heparin ?Code Status: Full code ?Family Communication: Discussed with patient in detail, no family at bedside ?Disposition Plan: To be determined, might need SNF ? ?Consultants:  ?Nephrology, vascular surgery ? ?Procedures: Revision of left BKA to AKA, debridement of right BKA ? ?Antimicrobials:  ? ? ?Objective: ?Vitals:  ? 04/24/21 0900 04/24/21 0930 04/24/21 0945 04/24/21 1000  ?BP: 93/78 98/80 109/86 109/84  ?Pulse: (!) 104 98 100 97  ?Resp:      ?Temp: (!) 94 ?F (34.4 ?C)     ?TempSrc: Axillary     ?SpO2: 100% 98% 100% 100%  ?Weight: 97 kg     ?Height:      ? ? ?Intake/Output Summary (Last 24 hours) at 04/24/2021 1006 ?Last data filed at 04/24/2021 0800 ?Gross per 24 hour  ?Intake 917.03 ml  ?Output --  ?Net 917.03 ml  ? ?Filed Weights  ? 04/23/21 0449 04/24/21 0500 04/24/21 0900  ?Weight: 96.3 kg 96.7 kg 97 kg  ? ? ?Examination: ? ?General exam: Chronically ill obese male laying in bed, somnolent but easily arousable, oriented to self, partly to place only ?HEENT: Neck obese unable to assess JVD ?CVS: S1-S2, regular rhythm ?Lungs: Decreased breath sounds to bases ?Abdomen: Soft, obese, nontender, bowel sounds present ?Extremities: Right BKA with dressing, left  AKA with dressing  ?Right femoral central line ?Skin: Scabs throughout his upper and lower extremities ?Psychiatry: Flat affect ? ? ? ?Data Reviewed:  ? ?CBC: ?Recent Labs  ?Lab 04/20/21 ?1234 04/21/21 ?0110 04/22/21 ?0129 04/22/21 ?1449 04/22/21 ?1736 04/23/21 ?0355 04/24/21 ?0420  ?WBC 9.7 10.1 10.7*  --  10.2 12.6* 16.4*  ?NEUTROABS 8.2*  --   8.6*  --   --  12.0* 14.9*  ?HGB 11.6* 12.4* 11.7* 11.9* 10.2* 10.4* 10.2*  ?HCT 36.4* 36.4* 34.9* 35.0* 30.7* 31.0* 30.6*  ?MCV 104.9* 103.1* 102.3*  --  102.7* 103.7* 104.4*  ?PLT 165 127* 125*  --  142* 147* 151  ? ?Basic Metabolic Panel: ?Recent Labs  ?Lab 04/21/21 ?0110 04/22/21 ?0129 04/22/21 ?1449 04/22/21 ?1736 04/23/21 ?0355 04/24/21 ?0420  ?NA 134* 133* 134* 132* 130* 132*  ?K 4.6 3.9 3.3* 3.8 4.0 4.2  ?CL 99 97*  --  97* 97* 98  ?CO2 20* 22  --  21* 18* 22  ?GLUCOSE 99 67*  --  106* 145* 71  ?BUN 34* 21*  --  23* 23* 28*  ?CREATININE 6.02* 4.28*  --  4.81* 4.93* 5.36*  ?CALCIUM 8.1* 7.8*  --  8.4* 8.4* 8.4*  ?PHOS  --  4.8*  --  5.8*  --   --   ? ?GFR: ?Estimated Creatinine Clearance: 19.3 mL/min (A) (by C-G formula based on SCr of 5.36 mg/dL (H)). ?Liver Function Tests: ?Recent Labs  ?Lab 04/20/21 ?1234 04/22/21 ?0129 04/22/21 ?1736  ?AST 24  --   --   ?ALT 17  --   --   ?ALKPHOS 106  --   --   ?BILITOT 1.6*  --   --   ?PROT 5.6*  --   --   ?ALBUMIN 2.1* 2.1* 2.2*  ? ?No results for input(s): LIPASE, AMYLASE in the last 168 hours. ?No results for input(s): AMMONIA in the last 168 hours. ?Coagulation Profile: ?No results for input(s): INR, PROTIME in the last 168 hours. ?Cardiac Enzymes: ?No results for input(s): CKTOTAL, CKMB, CKMBINDEX, TROPONINI in the last 168 hours. ?BNP (last 3 results) ?No results for input(s): PROBNP in the last 8760 hours. ?HbA1C: ?No results for input(s): HGBA1C in the last 72 hours. ?CBG: ?Recent Labs  ?Lab 04/23/21 ?1149 04/23/21 ?1937 04/23/21 ?2359 04/24/21 ?0408 04/24/21 ?0827  ?GLUCAP 142* 91 88 70 83  ? ?Lipid Profile: ?No results for input(s): CHOL, HDL, LDLCALC, TRIG, CHOLHDL, LDLDIRECT in the last 72 hours. ?Thyroid Function Tests: ?No results for input(s): TSH, T4TOTAL, FREET4, T3FREE, THYROIDAB in the last 72 hours. ?Anemia Panel: ?No results for input(s): VITAMINB12, FOLATE, FERRITIN, TIBC, IRON, RETICCTPCT in the last 72 hours. ?Urine analysis: ?No results found  for: COLORURINE, APPEARANCEUR, [place], [place], [place], [place], [place], KETONESUR, PROTEINUR, [place], NITRITE, LEUKOCYTESUR ?Sepsis Labs: ?@LABRCNTIP (procalcitonin:4,lacticidven:4) ? ?) ?Recent Results (from the past 240 hour(s))  ?Resp Panel by RT-PCR (Flu A&B, Covid) Nasopharyngeal Swab     Status: None  ? Collection Time: 04/20/21 12:54 PM  ? Specimen: Nasopharyngeal Swab; Nasopharyngeal(NP) swabs in vial transport medium  ?Result Value Ref Range Status  ? SARS Coronavirus 2 by RT PCR NEGATIVE NEGATIVE Final  ?  Comment: (NOTE) ?SARS-CoV-2 target nucleic acids are NOT DETECTED. ? ?The SARS-CoV-2 RNA is generally detectable in upper respiratory ?specimens during the acute phase of infection. The lowest ?concentration of SARS-CoV-2 viral copies this assay can detect is ?138 copies/mL. A negative result does not preclude SARS-Cov-2 ?infection and should not be used as the sole basis  for treatment or ?other patient management decisions. A negative result may occur with  ?improper specimen collection/handling, submission of specimen other ?than nasopharyngeal swab, presence of viral mutation(s) within the ?areas targeted by this assay, and inadequate number of viral ?copies(<138 copies/mL). A negative result must be combined with ?clinical observations, patient history, and epidemiological ?information. The expected result is Negative. ? ?Fact Sheet for Patients:  ?EntrepreneurPulse.com.au ? ?Fact Sheet for Healthcare Providers:  ?IncredibleEmployment.be ? ?This test is no t yet approved or cleared by the [place] FDA and  ?has been authorized for detection and/or diagnosis of SARS-CoV-2 by ?FDA under an Emergency Use Authorization (EUA). This EUA will remain  ?in effect (meaning this test can be used) for the duration of the ?COVID-19 declaration under Section 564(b)(1) of the Act, 21 ?U.S.C.section 360bbb-3(b)(1), unless the authorization is terminated  ?or  revoked sooner.  ? ? ?  ? Influenza A by PCR NEGATIVE NEGATIVE Final  ? Influenza B by PCR NEGATIVE NEGATIVE Final  ?  Comment: (NOTE) ?The Xpert Xpress SARS-CoV-2/FLU/RSV plus assay is intended as an aid ?

## 2021-04-24 NOTE — Progress Notes (Signed)
Edema in scrotum penis increased overnight, now weeping. Bolstered with ABD pads to mitigate leakage into femoral CVC, but dressing was changed twice.  ?

## 2021-04-24 NOTE — Progress Notes (Addendum)
[facility] [place] ?PROGRESS NOTE ? ?ASSESSMENT / PLAN: ?Brendan Campbell is a 51 y.o. male postoperative day 2 after left AKA and debridement of right below knee amputation.  Plan to keep left AKA dressings in place over the weekend.  Change right dressing wet-to-dry daily.  Okay for transfer to floor from my standpoint. Following. ? ?SUBJECTIVE: ?Reports pain.  ? ?OBJECTIVE: ?BP 109/86   Pulse 100   Temp (!) 94 ?F (34.4 ?C) (Axillary)   Resp 17   Ht 5\' 10"  (1.778 m) Comment: pre-amputation height  Wt 97 kg   SpO2 100%   BMI 30.68 kg/m?  ? ?Intake/Output Summary (Last 24 hours) at 04/24/2021 1003 ?Last data filed at 04/24/2021 0800 ?Gross per 24 hour  ?Intake 917.03 ml  ?Output --  ?Net 917.03 ml  ?  ?No distress ?RRR ?Unlabored ?Clean dressings to bilateral lower extremity amputations ? ?CBC Latest Ref Rng & Units 04/24/2021 04/23/2021 04/22/2021  ?WBC 4.0 - 10.5 K/uL 16.4(H) 12.6(H) 10.2  ?Hemoglobin 13.0 - 17.0 g/dL 10.2(L) 10.4(L) 10.2(L)  ?Hematocrit 39.0 - 52.0 % 30.6(L) 31.0(L) 30.7(L)  ?Platelets 150 - 400 K/uL 151 147(L) 142(L)  ?  ? ?CMP Latest Ref Rng & Units 04/24/2021 04/23/2021 04/22/2021  ?Glucose 70 - 99 mg/dL 71 145(H) 106(H)  ?BUN 6 - 20 mg/dL 28(H) 23(H) 23(H)  ?Creatinine 0.61 - 1.24 mg/dL 5.36(H) 4.93(H) 4.81(H)  ?Sodium 135 - 145 mmol/L 132(L) 130(L) 132(L)  ?Potassium 3.5 - 5.1 mmol/L 4.2 4.0 3.8  ?Chloride 98 - 111 mmol/L 98 97(L) 97(L)  ?CO2 22 - 32 mmol/L 22 18(L) 21(L)  ?Calcium 8.9 - 10.3 mg/dL 8.4(L) 8.4(L) 8.4(L)  ?Total Protein 6.5 - 8.1 g/dL - - -  ?Total Bilirubin 0.3 - 1.2 mg/dL - - -  ?Alkaline Phos 38 - 126 U/L - - -  ?AST 15 - 41 U/L - - -  ?ALT 0 - 44 U/L - - -  ? ? ?Estimated Creatinine Clearance: 19.3 mL/min (A) (by C-G formula based on SCr of 5.36 mg/dL (H)). ? ?Brendan Campbell. Brendan Breed, MD ?[facility] [place] ?Office Phone Number: [phone] ?04/24/2021 10:03 AM ? ? ? ?

## 2021-04-24 NOTE — Plan of Care (Signed)
?  Problem: Education: ?Goal: Knowledge of General Education information will improve ?Description: Including pain rating scale, medication(s)/side effects and non-pharmacologic comfort measures ?Outcome: Progressing ?  ?Problem: Clinical Measurements: ?Goal: Respiratory complications will improve ?Outcome: Progressing ?Goal: Cardiovascular complication will be avoided ?Outcome: Progressing ?  ?Problem: Pain Managment: ?Goal: General experience of comfort will improve ?Outcome: Progressing ?  ?Problem: Safety: ?Goal: Ability to remain free from injury will improve ?Outcome: Progressing ?  ?

## 2021-04-25 DIAGNOSIS — M86252 Subacute osteomyelitis, left femur: Secondary | ICD-10-CM | POA: Diagnosis not present

## 2021-04-25 DIAGNOSIS — T8140XA Infection following a procedure, unspecified, initial encounter: Secondary | ICD-10-CM | POA: Diagnosis not present

## 2021-04-25 DIAGNOSIS — M86172 Other acute osteomyelitis, left ankle and foot: Secondary | ICD-10-CM | POA: Diagnosis not present

## 2021-04-25 LAB — BASIC METABOLIC PANEL
Anion gap: 8 (ref 5–15)
BUN: 15 mg/dL (ref 6–20)
CO2: 26 mmol/L (ref 22–32)
Calcium: 7.8 mg/dL — ABNORMAL LOW (ref 8.9–10.3)
Chloride: 96 mmol/L — ABNORMAL LOW (ref 98–111)
Creatinine, Ser: 3.54 mg/dL — ABNORMAL HIGH (ref 0.61–1.24)
GFR, Estimated: 20 mL/min — ABNORMAL LOW (ref >60)
Glucose, Bld: 148 mg/dL — ABNORMAL HIGH (ref 70–99)
Potassium: 3.4 mmol/L — ABNORMAL LOW (ref 3.5–5.1)
Sodium: 130 mmol/L — ABNORMAL LOW (ref 135–145)

## 2021-04-25 LAB — CBC
HCT: 28 % — ABNORMAL LOW (ref 39.0–52.0)
Hemoglobin: 9.2 g/dL — ABNORMAL LOW (ref 13.0–17.0)
MCH: 34.3 pg — ABNORMAL HIGH (ref 26.0–34.0)
MCHC: 32.9 g/dL (ref 30.0–36.0)
MCV: 104.5 fL — ABNORMAL HIGH (ref 80.0–100.0)
Platelets: 123 10*3/uL — ABNORMAL LOW (ref 150–400)
RBC: 2.68 MIL/uL — ABNORMAL LOW (ref 4.22–5.81)
RDW: 18.7 % — ABNORMAL HIGH (ref 11.5–15.5)
WBC: 16.2 10*3/uL — ABNORMAL HIGH (ref 4.0–10.5)
nRBC: 0 % (ref 0.0–0.2)

## 2021-04-25 LAB — GLUCOSE, CAPILLARY
Glucose-Capillary: 63 mg/dL — ABNORMAL LOW (ref 70–99)
Glucose-Capillary: 65 mg/dL — ABNORMAL LOW (ref 70–99)
Glucose-Capillary: 67 mg/dL — ABNORMAL LOW (ref 70–99)
Glucose-Capillary: 68 mg/dL — ABNORMAL LOW (ref 70–99)
Glucose-Capillary: 75 mg/dL (ref 70–99)
Glucose-Capillary: 81 mg/dL (ref 70–99)
Glucose-Capillary: 83 mg/dL (ref 70–99)
Glucose-Capillary: 86 mg/dL (ref 70–99)
Glucose-Capillary: 88 mg/dL (ref 70–99)
Glucose-Capillary: 97 mg/dL (ref 70–99)

## 2021-04-25 LAB — CULTURE, BLOOD (ROUTINE X 2)
Culture: NO GROWTH
Culture: NO GROWTH
Special Requests: ADEQUATE
Special Requests: ADEQUATE

## 2021-04-25 LAB — HEPARIN LEVEL (UNFRACTIONATED): Heparin Unfractionated: <0.1 IU/mL — ABNORMAL LOW (ref 0.30–0.70)

## 2021-04-25 LAB — HEPATITIS B SURFACE ANTIBODY, QUANTITATIVE: Hep B S AB Quant (Post): 53.8 m[IU]/mL (ref >9.9)

## 2021-04-25 MED ORDER — POTASSIUM CHLORIDE CRYS ER 20 MEQ PO TBCR: 40.0000 meq | EXTENDED_RELEASE_TABLET | Freq: Once | ORAL | Status: DC

## 2021-04-25 MED ORDER — POTASSIUM CHLORIDE 20 MEQ PO PACK
40.0000 meq | PACK | Freq: Once | ORAL | Status: AC
  Administered 2021-04-25: 40 meq via ORAL
  Filled 2021-04-25: qty 2

## 2021-04-25 NOTE — Progress Notes (Signed)
?   04/25/21 1244  ?Assess: MEWS Score  ?Temp (!) 97.4 ?F (36.3 ?C)  ?BP (!) 86/62  ?Pulse Rate (!) 106  ?ECG Heart Rate (!) 106  ?Resp 16  ?SpO2 100 %  ?O2 Device Nasal Cannula  ?Assess: MEWS Score  ?MEWS Temp 0  ?MEWS Systolic 1  ?MEWS Pulse 1  ?MEWS RR 0  ?MEWS LOC 0  ?MEWS Score 2  ?MEWS Score Color Yellow  ?Assess: if the MEWS score is Yellow or Red  ?Were vital signs taken at a resting state? Yes  ?Focused Assessment No change from prior assessment  ?MEWS guidelines implemented *See Row Information* No, previously yellow, continue vital signs every 4 hours  ?Treat  ?MEWS Interventions Administered prn meds/treatments  ?Pain Scale 0-10  ?Pain Score 10  ?Pain Type Surgical pain  ?Pain Location Other (Comment) ?(stump)  ?Pain Orientation Right;Left  ?Pain Frequency Intermittent  ?Pain Onset On-going  ?Pain Intervention(s) Medication (See eMAR);PCA encouraged  ?Multiple Pain Sites Yes  ?Notify: Charge Nurse/RN  ?Name of Charge Nurse/RN Notified Janett Billow, RN  ?Date Charge Nurse/RN Notified 04/18/21  ?Time Charge Nurse/RN Notified 1244  ? ? ?

## 2021-04-25 NOTE — Progress Notes (Signed)
?[facility] ?Progress Note  ? ?Subjective:   Some hypotension on dialysis yesterday but overall tolerated it well.  2.5 L removed.  Feeling better today.  Continues to have some scrotal edema ? ?Objective ?Vitals:  ? 04/25/21 0000 04/25/21 0339 04/25/21 0449 04/25/21 0800  ?BP:      ?Pulse: (!) 102     ?Resp:   19   ?Temp:  97.6 ?F (36.4 ?C)  97.6 ?F (36.4 ?C)  ?TempSrc:  Oral  Oral  ?SpO2: 100%  100%   ?Weight:      ?Height:      ? ?Physical Exam ?General appearance: Chronically ill-appearing, lying in bed, no distress ?Resp: Bilateral chest rise with no increased work of breathing ?Cardio: regular rate and rhythm ?GI: soft, non-tender, scrotal edema present ?Extremities: right bka and left aka (w/ wound vac), some mild lower extremity edema present bilaterally ?  ? ?Additional Objective ?Labs: ?Basic Metabolic Panel: ?Recent Labs  ?Lab 04/22/21 ?0129 04/22/21 ?1449 04/22/21 ?1736 04/23/21 ?0355 04/24/21 ?2951 04/25/21 ?0214  ?NA 133*   < > 132* 130* 132* 130*  ?K 3.9   < > 3.8 4.0 4.2 3.4*  ?CL 97*  --  97* 97* 98 96*  ?CO2 22  --  21* 18* 22 26  ?GLUCOSE 67*  --  106* 145* 71 148*  ?BUN 21*  --  23* 23* 28* 15  ?CREATININE 4.28*  --  4.81* 4.93* 5.36* 3.54*  ?CALCIUM 7.8*  --  8.4* 8.4* 8.4* 7.8*  ?PHOS 4.8*  --  5.8*  --   --   --   ? < > = values in this interval not displayed.  ? ?Liver Function Tests: ?Recent Labs  ?Lab 04/20/21 ?1234 04/22/21 ?0129 04/22/21 ?1736  ?AST 24  --   --   ?ALT 17  --   --   ?ALKPHOS 106  --   --   ?BILITOT 1.6*  --   --   ?PROT 5.6*  --   --   ?ALBUMIN 2.1* 2.1* 2.2*  ? ?No results for input(s): LIPASE, AMYLASE in the last 168 hours. ?CBC: ?Recent Labs  ?Lab 04/21/21 ?0110 04/22/21 ?0129 04/22/21 ?1449 04/22/21 ?1736 04/23/21 ?0355 04/24/21 ?0420  ?WBC 10.1 10.7*  --  10.2 12.6* 16.4*  ?NEUTROABS  --  8.6*  --   --  12.0* 14.9*  ?HGB 12.4* 11.7*   < > 10.2* 10.4* 10.2*  ?HCT 36.4* 34.9*   < > 30.7* 31.0* 30.6*  ?MCV 103.1* 102.3*  --  102.7* 103.7* 104.4*  ?PLT  127* 125*  --  142* 147* 151  ? < > = values in this interval not displayed.  ? ?Blood Culture ?   ?Component Value Date/Time  ? SDES BLOOD RIGHT FOREARM 04/20/2021 1322  ? SPECREQUEST  04/20/2021 1322  ?  BOTTLES DRAWN AEROBIC AND ANAEROBIC Blood Culture adequate volume  ? CULT  04/20/2021 1322  ?  NO GROWTH 4 DAYS ?Performed at [facility], [place] [address], [place], [place] [address] ?  ? REPTSTATUS PENDING 04/20/2021 1322  ? ? ?Cardiac Enzymes: ?No results for input(s): CKTOTAL, CKMB, CKMBINDEX, TROPONINI in the last 168 hours. ?CBG: ?Recent Labs  ?Lab 04/25/21 ?0010 04/25/21 ?6063 04/25/21 ?0411 04/25/21 ?0160 04/25/21 ?1093  ?GLUCAP 97 67* 68* 86 65*  ? ?Iron Studies: No results for input(s): IRON, TIBC, TRANSFERRIN, FERRITIN in the last 72 hours. ?@[email or web address] @ ?Studies/Results: ?No results found. ?Medications: ? sodium chloride    ? sodium  chloride    ? ceFEPime (MAXIPIME) IV Stopped (04/24/21 2327)  ? dextrose 50 mL/hr at 04/25/21 0201  ? metronidazole 500 mg (04/25/21 0111)  ? promethazine (PHENERGAN) injection (IM or IVPB) Stopped (04/23/21 0325)  ? vancomycin Stopped (04/21/21 1950)  ? ? amiodarone  200 mg Oral Daily  ? vitamin C  250 mg Oral BID  ? atorvastatin  10 mg Oral Q M,W,F  ? calcium acetate  667 mg Oral TID WC  ? cholecalciferol  1,000 Units Oral Q M,W,F  ? feeding supplement  237 mL Oral TID BM  ? feeding supplement (NEPRO CARB STEADY)  237 mL Oral TID BM  ? HYDROmorphone   Intravenous Q4H  ? leptospermum manuka honey  1 application. Topical Daily  ? multivitamin  1 tablet Oral QHS  ? mupirocin ointment  1 application. Nasal BID  ? pantoprazole  40 mg Oral Daily  ? potassium chloride  40 mEq Oral Once  ? sodium zirconium cyclosilicate  10 g Oral Once  ? zinc sulfate  220 mg Oral Daily  ? ? ?Dialysis Orders:  [name]  on MWF ? 4h 30min  95 kg   1K/2.25 bath  Hep 1000+ 500u/hr  LUE AVG  300/500   ?- prosthetic weighs 1.6 kg ? - Epogen 1000   Units IV/HD   ?   ?Assessment/Plan ?**stump infection:  broad spectrum antibiotics.  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  Complicated by hypotension but now more stable.  Management per vascular ?  ?**ESRD on HD:  HD per usual schedule MWF - heparin per outpt held due to systemic heparin gtt for periop a fib anticoag.  Got HD on 3/18 with some hypotension but overall tolerated it. Will plan for HD per schedule tomorrow ?  ?**Anemia:  Hb in 10-11, follow.  On low dose epogen outpt.  ?  ?**Nutrition: supplements ?  ?**HL: statin   ? ?**PAF: heparin gtt bridgin eliquis, mgmt per primary ? ? ? ?

## 2021-04-25 NOTE — Progress Notes (Signed)
ANTICOAGULATION CONSULT NOTE  ? ?Pharmacy Consult for heparin ?Indication: atrial fibrillation ? ?Allergies  ?Allergen Reactions  ? Tape Other (See Comments)  ?  Pulls off the skin  ? Chlorhexidine Gluconate [Chlorhexidine] Rash  ? ? ?Patient Measurements: ?Height: 5\' 10"  (177.8 cm) (pre-amputation height) ?Weight: 97 kg (213 lb 13.5 oz) ?IBW/kg (Calculated) : 73 ?Heparin Dosing Weight: 93 kg due to non-obese when using pre-amputation height of 6' ? ?Vital Signs: ?Temp: 97.6 ?F (36.4 ?C) (03/19 7121) ?Temp Source: Oral (03/19 9758) ?BP: 97/72 (03/18 2325) ?Pulse Rate: 102 (03/19 0000) ? ?Labs: ?Recent Labs  ?  04/22/21 ?1736 04/22/21 ?2142 04/23/21 ?0355 04/23/21 ?0400 04/24/21 ?0420 04/25/21 ?0214  ?HGB 10.2*  --  10.4*  --  10.2*  --   ?HCT 30.7*  --  31.0*  --  30.6*  --   ?PLT 142*  --  147*  --  151  --   ?APTT  --  100*  --   --   --   --   ?HEPARINUNFRC  --  0.38  --  0.43 0.38  --   ?CREATININE 4.81*  --  4.93*  --  5.36* 3.54*  ? ? ? ?Estimated Creatinine Clearance: 29.2 mL/min (A) (by C-G formula based on SCr of 3.54 mg/dL (H)). ? ?Medications:  ?Patient taking apixaban 5 mg BID PTA ?Patient reported the last dose of apixaban was 3/13 in the evening ? ?Assessment: ?51  y.o. M on apixaban PTA for afib. Plan for surgery 3/16 so held apixaban and bridging with heparin. He is now s/p L AKA on 3/16 ? ?Patient experiencing bruising on right area which is spreading. Per Dr. Broadus John hold heparin until she evaluates today.  ? ?Goal of Therapy:  ?Heparin level 0.3-0.7 units/mL ?Monitor platelets by anticoagulation protocol: Yes ?  ?Plan:   ?Hold heparin ?Follow-up plans to resume heparin or resume apixiban ? ?Cathrine Muster, PharmD ?PGY2 Cardiology Pharmacy Resident ?Phone: [phone] ?04/25/2021  7:32 AM ? ?Please check [email or web address] for unit-specific pharmacy phone numbers. ? ? ? ? ? ?

## 2021-04-25 NOTE — Progress Notes (Addendum)
?PROGRESS NOTE ? ? ? ?Brendan Campbell  MVH:[id number] DOB: [birth year] DOA: 04/20/2021 ?PCP: Monico Blitz, MD  ?[place] y.o. 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  ?Patient underwent left leg BKA on 02/16/2021.  In February, he started having pain at the BKA site with some foul-smelling discharge. On 3/14, he was seen by vascular surgery in the office.  There was concern of deep tissue infection and hence patient was sent to the ED.   ?3/16, patient underwent revision of left AKA from BKA, debridement right AKA. ?Because of intraoperative hypotension, patient required pressors and was briefly admitted to ICU, subsequently BP stabilized and transferred to  [facility] service. ?-Was started on D10 drip and PCA pump ? ? ?Subjective: ?Considerable bruising, ecchymosis around right forearm, continues to have pain at his amputation site, using the PCA pump ? ?Assessment & Plan: ? ?Left BKA stump infected ulcer ?History of left BKA in January 2023 ?-Presented with wound infection, very poor hygiene, abnormal hair found in wound ?-3/16, underwent left AKA, converted from BKA, debridement right BKA ?-Vascular surgery following, remains on Dilaudid PCA ?-Continue wound care of right BKA ?-White count still 16.4, continue current ABX 1 more day, blood cultures are negative ?-PT eval, possibly on Monday ?-Transfer out of ICU ?  ?Hypotension ?-Developed intraoperative hypotension, likely secondary to sedation, pain meds  ?-Stable off pressors, monitor with HD, might need midodrine ? ?Right forearm hematoma/ecchymosis ?-Hold IV heparin today as well, fortunately he is in sinus rhythm ?-Hemoglobin is down slightly, continue to trend ?  ?ESRD HD MWF ?Hyperkalemia ?Volume overload, scrotal edema ?-Nephrology following.   ?-Dialyzed yesterday ?  ?Chronic combined systolic and  diastolic CHF  ?-Volume managed with HD ?  ?Paroxysmal A-fib ?-Continue amiodarone ?-Eliquis, heparin on hold, restart Eliquis in 1 to 2 days ?  ?Type 2 diabetes mellitus with hypOglycemia ?-A1c 5.3 on 3/14 ?-Was started on D10 drip due to frequent hypoglycemia last 48 hours, attempt to wean off D10 drip today, supplements added ? ?COPD/chronic respiratory failure ?-Has been using 2 L O2 as needed for few years ?-Not volume overloaded, continue pulmonary toilet ?  ?Peripheral artery disease ?s/p prior bilateral BKA ?-PTA, patient was on aspirin, Eliquis, Lipitor.  ?-Eliquis and aspirin on hold.  continue statin ?  ?Anemia secondary to CKD ?-H&H stable. Continue systemic anticoagulation. ?-Continue to monitor ?  ?DVT prophylaxis: Heparin now on hold ?Code Status: Full code ?Family Communication: Discussed with patient in detail, no family at bedside ?Disposition Plan: To be determined, might need SNF ? ?Consultants:  ?Nephrology, vascular surgery ? ?Procedures: Revision of left BKA to AKA, debridement of right BKA ? ?Antimicrobials:  ? ? ?Objective: ?Vitals:  ? 04/25/21 0339 04/25/21 0449 04/25/21 0800 04/25/21 0856  ?BP:      ?Pulse:      ?Resp:  19  (!) 2  ?Temp: 97.6 ?F (36.4 ?C)  97.6 ?F (36.4 ?C)   ?TempSrc: Oral  Oral   ?SpO2:  100%  100%  ?Weight:      ?Height:      ? ? ?Intake/Output Summary (Last 24 hours) at 04/25/2021 1103 ?Last data filed at 04/25/2021 0000 ?Gross per 24 hour  ?Intake 1293.91 ml  ?Output 2500 ml  ?Net -1206.09 ml  ? ?Filed Weights  ? 04/23/21 0449 04/24/21 0500 04/24/21 0900  ?Weight: 96.3 kg 96.7 kg 97 kg  ? ? ?  Examination: ? ?General exam: Chronically ill obese male laying in bed, AAO x3, ?HEENT: Neck obese unable to assess JVD ?CVS: S1-S2, regular rhythm ?Lungs: Decreased breath sounds bases ?Abdomen: Soft, obese, nontender, bowel sounds present  ?Extremities: Right BKA with dressing, left AKA with dressing , extensive bruising/ecchymosis noted in right forearm, elbow ?Right femoral  central line ?Skin: Scabs throughout his upper and lower extremities ?Psychiatry: Flat affect ? ? ? ?Data Reviewed:  ? ?CBC: ?Recent Labs  ?Lab 04/20/21 ?1234 04/21/21 ?0110 04/22/21 ?0129 04/22/21 ?1449 04/22/21 ?1736 04/23/21 ?0355 04/24/21 ?5409 04/25/21 ?8119  ?WBC 9.7   < > 10.7*  --  10.2 12.6* 16.4* 16.2*  ?NEUTROABS 8.2*  --  8.6*  --   --  12.0* 14.9*  --   ?HGB 11.6*   < > 11.7* 11.9* 10.2* 10.4* 10.2* 9.2*  ?HCT 36.4*   < > 34.9* 35.0* 30.7* 31.0* 30.6* 28.0*  ?MCV 104.9*   < > 102.3*  --  102.7* 103.7* 104.4* 104.5*  ?PLT 165   < > 125*  --  142* 147* 151 123*  ? < > = values in this interval not displayed.  ? ?Basic Metabolic Panel: ?Recent Labs  ?Lab 04/22/21 ?0129 04/22/21 ?1449 04/22/21 ?1736 04/23/21 ?0355 04/24/21 ?1478 04/25/21 ?0214  ?NA 133* 134* 132* 130* 132* 130*  ?K 3.9 3.3* 3.8 4.0 4.2 3.4*  ?CL 97*  --  97* 97* 98 96*  ?CO2 22  --  21* 18* 22 26  ?GLUCOSE 67*  --  106* 145* 71 148*  ?BUN 21*  --  23* 23* 28* 15  ?CREATININE 4.28*  --  4.81* 4.93* 5.36* 3.54*  ?CALCIUM 7.8*  --  8.4* 8.4* 8.4* 7.8*  ?PHOS 4.8*  --  5.8*  --   --   --   ? ?GFR: ?Estimated Creatinine Clearance: 29.2 mL/min (A) (by C-G formula based on SCr of 3.54 mg/dL (H)). ?Liver Function Tests: ?Recent Labs  ?Lab 04/20/21 ?1234 04/22/21 ?0129 04/22/21 ?1736  ?AST 24  --   --   ?ALT 17  --   --   ?ALKPHOS 106  --   --   ?BILITOT 1.6*  --   --   ?PROT 5.6*  --   --   ?ALBUMIN 2.1* 2.1* 2.2*  ? ?No results for input(s): LIPASE, AMYLASE in the last 168 hours. ?No results for input(s): AMMONIA in the last 168 hours. ?Coagulation Profile: ?No results for input(s): INR, PROTIME in the last 168 hours. ?Cardiac Enzymes: ?No results for input(s): CKTOTAL, CKMB, CKMBINDEX, TROPONINI in the last 168 hours. ?BNP (last 3 results) ?No results for input(s): PROBNP in the last 8760 hours. ?HbA1C: ?No results for input(s): HGBA1C in the last 72 hours. ?CBG: ?Recent Labs  ?Lab 04/25/21 ?0411 04/25/21 ?0440 04/25/21 ?2956 04/25/21 ?2130  04/25/21 ?0935  ?GLUCAP 68* 86 65* 63* 81  ? ?Lipid Profile: ?No results for input(s): CHOL, HDL, LDLCALC, TRIG, CHOLHDL, LDLDIRECT in the last 72 hours. ?Thyroid Function Tests: ?No results for input(s): TSH, T4TOTAL, FREET4, T3FREE, THYROIDAB in the last 72 hours. ?Anemia Panel: ?No results for input(s): VITAMINB12, FOLATE, FERRITIN, TIBC, IRON, RETICCTPCT in the last 72 hours. ?Urine analysis: ?No results found for: COLORURINE, APPEARANCEUR, [place], [place], [place], [place], [place], KETONESUR, PROTEINUR, [place], NITRITE, LEUKOCYTESUR ?Sepsis Labs: ?@[email or web address] (procalcitonin:4,lacticidven:4) ? ?) ?Recent Results (from the past 240 hour(s))  ?Resp Panel by RT-PCR (Flu A&B, Covid) Nasopharyngeal Swab     Status: None  ? Collection Time: 04/20/21 12:54 PM  ?  Specimen: Nasopharyngeal Swab; Nasopharyngeal(NP) swabs in vial transport medium  ?Result Value Ref Range Status  ? SARS Coronavirus 2 by RT PCR NEGATIVE NEGATIVE Final  ?  Comment: (NOTE) ?SARS-CoV-2 target nucleic acids are NOT DETECTED. ? ?The SARS-CoV-2 RNA is generally detectable in upper respiratory ?specimens during the acute phase of infection. The lowest ?concentration of SARS-CoV-2 viral copies this assay can detect is ?138 copies/mL. A negative result does not preclude SARS-Cov-2 ?infection and should not be used as the sole basis for treatment or ?other patient management decisions. A negative result may occur with  ?improper specimen collection/handling, submission of specimen other ?than nasopharyngeal swab, presence of viral mutation(s) within the ?areas targeted by this assay, and inadequate number of viral ?copies(<138 copies/mL). A negative result must be combined with ?clinical observations, patient history, and epidemiological ?information. The expected result is Negative. ? ?Fact Sheet for Patients:  ?EntrepreneurPulse.com.au ? ?Fact Sheet for Healthcare Providers:   ?IncredibleEmployment.be ? ?This test is no t yet approved or cleared by the [place] FDA and  ?has been authorized for detection and/or diagnosis of SARS-CoV-2 by ?FDA under an Emergency Use Authorization (EUA). This EUA will remain  ?in

## 2021-04-25 NOTE — Progress Notes (Addendum)
[facility] [place] ?PROGRESS NOTE ? ?ASSESSMENT / PLAN: ?Brendan Campbell is a 51 y.o. male postoperative day 3 after left AKA and debridement of right below knee amputation.  Plan to keep left AKA dressings in place over the weekend.  Change right dressing wet-to-dry daily.  Okay for transfer to floor from my standpoint. Following. ? ?SUBJECTIVE: ?Has lost his wallet. No complaints related to lower extremities. ? ?OBJECTIVE: ?BP 97/72 (BP Location: Right Arm)   Pulse (!) 102   Temp 97.6 ?F (36.4 ?C) (Oral)   Resp (!) 2   Ht 5\' 10"  (1.778 m) Comment: pre-amputation height  Wt 97 kg   SpO2 100%   BMI 30.68 kg/m?  ? ?Intake/Output Summary (Last 24 hours) at 04/25/2021 1035 ?Last data filed at 04/25/2021 0000 ?Gross per 24 hour  ?Intake 1293.91 ml  ?Output 2500 ml  ?Net -1206.09 ml  ? ?  ?No distress ?RRR ?Unlabored ?Clean dressings to bilateral lower extremity amputations ? ?CBC Latest Ref Rng & Units 04/25/2021 04/24/2021 04/23/2021  ?WBC 4.0 - 10.5 K/uL 16.2(H) 16.4(H) 12.6(H)  ?Hemoglobin 13.0 - 17.0 g/dL 9.2(L) 10.2(L) 10.4(L)  ?Hematocrit 39.0 - 52.0 % 28.0(L) 30.6(L) 31.0(L)  ?Platelets 150 - 400 K/uL 123(L) 151 147(L)  ?  ? ?CMP Latest Ref Rng & Units 04/25/2021 04/24/2021 04/23/2021  ?Glucose 70 - 99 mg/dL 148(H) 71 145(H)  ?BUN 6 - 20 mg/dL 15 28(H) 23(H)  ?Creatinine 0.61 - 1.24 mg/dL 3.54(H) 5.36(H) 4.93(H)  ?Sodium 135 - 145 mmol/L 130(L) 132(L) 130(L)  ?Potassium 3.5 - 5.1 mmol/L 3.4(L) 4.2 4.0  ?Chloride 98 - 111 mmol/L 96(L) 98 97(L)  ?CO2 22 - 32 mmol/L 26 22 18(L)  ?Calcium 8.9 - 10.3 mg/dL 7.8(L) 8.4(L) 8.4(L)  ?Total Protein 6.5 - 8.1 g/dL - - -  ?Total Bilirubin 0.3 - 1.2 mg/dL - - -  ?Alkaline Phos 38 - 126 U/L - - -  ?AST 15 - 41 U/L - - -  ?ALT 0 - 44 U/L - - -  ? ? ?Estimated Creatinine Clearance: 29.2 mL/min (A) (by C-G formula based on SCr of 3.54 mg/dL (H)). ? ?[name]. Stanford Breed, MD ?[facility] [place] ?Office Phone Number: [phone] ?04/25/2021 10:35 AM ? ? ? ?

## 2021-04-25 NOTE — Evaluation (Signed)
Clinical/Bedside Swallow Evaluation ?Patient Details  ?Name: Brendan Campbell ?MRN: [id number] ?Date of Birth: January 26, 1971 ? ?Today's Date: 04/25/2021 ?Time: SLP Start Time (ACUTE ONLY): 2683 SLP Stop Time (ACUTE ONLY): 1150 ?SLP Time Calculation (min) (ACUTE ONLY): 9 min ? ?Past Medical History:  ?Past Medical History:  ?Diagnosis Date  ? Anemia   ? Anxiety   ? Blood transfusion without reported diagnosis   ? CHF (congestive heart failure) ([place])   ? a. EF 25% by echo in 07/2019  ? Chronic kidney disease   ? Depression   ? Diabetes mellitus without complication ([place])   ? End stage renal disease ([place])   ? M/W/F [name]  ? Hyperlipidemia   ? Neuropathy   ? Peripheral vascular disease ([place])   ? PTSD (post-traumatic stress disorder)   ? ?Past Surgical History:  ?Past Surgical History:  ?Procedure Laterality Date  ? A/V FISTULAGRAM N/A 10/09/2018  ? Procedure: A/V FISTULAGRAM;  Surgeon: Serafina Mitchell, MD;  Location: [place] [facility];  Service: Cardiovascular;  Laterality: N/A;  ? AMPUTATION Left 02/16/2021  ? Procedure: AMPUTATION BELOW KNEE;  Surgeon: Broadus John, MD;  Location: [place];  Service: Vascular;  Laterality: Left;  ? AMPUTATION Left 04/22/2021  ? Procedure: LEFT ABOVE KNEE AMPUTATION;  Surgeon: Broadus John, MD;  Location: [place];  Service: Vascular;  Laterality: Left;  ? AV FISTULA PLACEMENT Left 09/22/2016  ? Procedure: CREATION OF LEFT ARM ARTERIOVENOUS (AV) FISTULA;  Surgeon: Waynetta Sandy, MD;  Location: [place];  Service: Vascular;  Laterality: Left;  ? AV FISTULA PLACEMENT Left 10/31/2017  ? Procedure: INSERTION OF ARTERIOVENOUS (AV) GORE-TEX 4-39mm STETCH GRAFT LEFT ARM;  Surgeon: Waynetta Sandy, MD;  Location: [place];  Service: Vascular;  Laterality: Left;  ? BASCILIC VEIN TRANSPOSITION Left 08/17/2017  ? Procedure: SECOND STAGE BASILIC VEIN TRANSPOSITION LEFT ARM;  Surgeon: Waynetta Sandy, MD;  Location: [place];  Service: Vascular;  Laterality: Left;  ? BELOW KNEE LEG  AMPUTATION Right   ? CARDIOVERSION N/A 02/19/2019  ? Procedure: CARDIOVERSION;  Surgeon: [name], MD;  Location: [facility];  Service: Cardiovascular;  Laterality: N/A;  ? CATARACT EXTRACTION W/PHACO Right 10/23/2020  ? Procedure: CATARACT EXTRACTION PHACO AND INTRAOCULAR LENS PLACEMENT (IOC);  Surgeon: Baruch Goldmann, MD;  Location: [facility];  Service: Ophthalmology;  Laterality: Right;  CDE 39.03  ? CHOLECYSTECTOMY    ? FOOT SURGERY    ? IR FLUORO GUIDE CV LINE RIGHT  05/16/2016  ? IR FLUORO GUIDE CV LINE RIGHT  02/16/2019  ? IR REMOVAL TUN CV CATH W/O FL  05/16/2016  ? IR REMOVAL TUN CV CATH W/O FL  02/19/2019  ? IR THROMBECTOMY AV FISTULA W/THROMBOLYSIS/PTA INC/SHUNT/IMG LEFT Left 11/09/2018  ? IR THROMBECTOMY AV FISTULA W/THROMBOLYSIS/PTA INC/SHUNT/IMG LEFT Left 06/22/2019  ? IR US GUIDE VASC ACCESS LEFT  11/09/2018  ? IR US GUIDE VASC ACCESS LEFT  06/22/2019  ? IR US GUIDE VASC ACCESS RIGHT  05/16/2016  ? IR US GUIDE VASC ACCESS RIGHT  02/16/2019  ? PERIPHERAL VASCULAR BALLOON ANGIOPLASTY  10/09/2018  ? Procedure: PERIPHERAL VASCULAR BALLOON ANGIOPLASTY;  Surgeon: Serafina Mitchell, MD;  Location: [place] [facility];  Service: Cardiovascular;;  ? TEE WITHOUT CARDIOVERSION N/A 02/19/2019  ? Procedure: TRANSESOPHAGEAL ECHOCARDIOGRAM (TEE);  Surgeon: [name], MD;  Location: [place];  Service: Cardiovascular;  Laterality: N/A;  ? WOUND DEBRIDEMENT Right 04/22/2021  ? Procedure: DEBRIDEMENT WOUND;  Surgeon: Broadus John, MD;  Location:  [facility];  Service: Vascular;  Laterality: Right;  ? ?HPI:  ?Pt is 51 y.o. male who underwent left leg BKA on 02/16/2021.  In February, he started having pain at the BKA site with some foul-smelling discharge. On 3/14, he was seen by vascular surgery in the office.  There was concern of deep tissue infection and hence patient was sent to the ED.  3/16, patient underwent revision of left AKA from BKA, debridement right AKA.  Because of intraoperative hypotension, patient required  pressors and was briefly admitted to ICU, subsequently BP stabilized and transferred to [facility] service. 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  ?  ?Assessment / Plan / Recommendation  ?Clinical Impression ? Pt presents with a mild oral dysphagia 2/2 partial edentulism.  Pt tolerated all consistencies trialed with no clinical s/s of aspiration.  With regular texture solid pt stated it would be difficult to chew and broke cracker into small pieces. He acheived adequate oral clearance.  Pt declined trial of soft solid.  He stated he was too full from midday meal tray for any additional trials.  Pt with meal tray noted to have expectorated food on it and states there are some things he cannot chew.  Discussed diet preference with pt who would prefer to continue regular texture diet.  He can choose foods that are softer as needed, and may need some help selecting easier to eat foods, but is opposed to modified texture diet.  Pt and RN report difficulty with pills.  Pt says it was because he took a large pill first.  He felt that medication would not clear, but has not experienced this sensation with food or drink.  ? ?Recommend continuing regular texture diet with thin liquid.  Cut medications in half or crush as needed. SLP will sign off.  Pt has no further ST needs at this time. ? ?SLP Visit Diagnosis: Dysphagia, oral phase (R13.11) ?   ?Aspiration Risk ? Mild aspiration risk  ?  ?Diet Recommendation Regular;Thin liquid  ? ?Liquid Administration via: Cup;Straw ?Medication Administration: Whole meds with liquid (cut in half or crush as needed.  Administer whole with puree if this is helpful for pt.) ?Supervision: Patient able to self feed ?Compensations: Slow rate;Small sips/bites ?Postural Changes: Seated upright at 90 degrees  ?  ?Other  Recommendations Oral  Care Recommendations: Oral care BID   ? ?Recommendations for follow up therapy are one component of a multi-disciplinary discharge planning process, led by the attending physician.  Recommendations may be updated based on patient status, additional functional criteria and insurance authorization. ? ?Follow up Recommendations No SLP follow up  ? ? ?  ?Assistance Recommended at Discharge None  ?Functional Status Assessment Patient has not had a recent decline in their functional status  ?Frequency and Duration   N/A ?  ?  ?   ? ?Prognosis Prognosis for Safe Diet Advancement:  (N/A)  ? ?  ? ?Swallow Study   ?General HPI: Pt is 51 y.o. male who underwent left leg BKA on 02/16/2021.  In February, he started having pain at the BKA site with some foul-smelling discharge. On 3/14, he was seen by vascular surgery in the office.  There was concern of deep tissue infection and hence patient was sent to the ED.  3/16, patient underwent revision of left AKA from BKA, debridement right AKA.  Because of intraoperative hypotension, patient required pressors and was briefly admitted to ICU, subsequently BP stabilized and transferred to [facility] service. 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  ?  ?Oral/Motor/Sensory Function Overall Oral Motor/Sensory Function: Within functional limits ?Facial ROM: Within Functional Limits ?Facial Symmetry: Within Functional Limits ?Lingual ROM: Within Functional Limits ?Lingual Symmetry: Within Functional Limits ?Velum: Within Functional Limits ?Mandible: Within Functional Limits   ?Ice Chips Ice chips: Not tested   ?Thin Liquid Thin Liquid: Within functional limits ?Presentation: Cup;Straw  ?  ?Nectar Thick Nectar Thick Liquid: Not tested   ?Honey Thick     ?Puree Puree: Within functional limits   ?Solid ? ? ?  Solid: Impaired ?Presentation:  Self Fed ?Oral Phase Impairments: Impaired mastication ?Oral Phase Functional Implications: Impaired mastication  ? ?  ? ?Zsofia Prout E Danalee Flath, MA, CCC-SLP ?Acute Rehabilitation Services ?Office: [phone]; Page

## 2021-04-25 NOTE — Plan of Care (Signed)
?  Problem: Education: ?Goal: Knowledge of General Education information will improve ?Description: Including pain rating scale, medication(s)/side effects and non-pharmacologic comfort measures ?Outcome: Progressing ?  ?Problem: Health Behavior/Discharge Planning: ?Goal: Ability to manage health-related needs will improve ?Outcome: Progressing ?  ?Problem: Clinical Measurements: ?Goal: Ability to maintain clinical measurements within normal limits will improve ?Outcome: Progressing ?Goal: Will remain free from infection ?Outcome: Progressing ?Goal: Diagnostic test results will improve ?Outcome: Progressing ?Goal: Respiratory complications will improve ?Outcome: Progressing ?Goal: Cardiovascular complication will be avoided ?Outcome: Progressing ?  ?Problem: Activity: ?Goal: Risk for activity intolerance will decrease ?Outcome: Progressing ?  ?Problem: Nutrition: ?Goal: Adequate nutrition will be maintained ?Outcome: Progressing ?  ?Problem: Coping: ?Goal: Level of anxiety will decrease ?Outcome: Progressing ?  ?Problem: Elimination: ?Goal: Will not experience complications related to bowel motility ?Outcome: Progressing ?Goal: Will not experience complications related to urinary retention ?Outcome: Progressing ?  ?Problem: Pain Managment: ?Goal: General experience of comfort will improve ?Outcome: Progressing ?  ?Problem: Safety: ?Goal: Ability to remain free from injury will improve ?Outcome: Progressing ?  ?Problem: Skin Integrity: ?Goal: Risk for impaired skin integrity will decrease ?Outcome: Progressing ?  ?Problem: Education: ?Goal: Knowledge of General Education information will improve ?Description: Including pain rating scale, medication(s)/side effects and non-pharmacologic comfort measures ?Outcome: Progressing ?  ?Problem: Health Behavior/Discharge Planning: ?Goal: Ability to manage health-related needs will improve ?Outcome: Progressing ?  ?Problem: Clinical Measurements: ?Goal: Ability to maintain  clinical measurements within normal limits will improve ?Outcome: Progressing ?Goal: Will remain free from infection ?Outcome: Progressing ?Goal: Diagnostic test results will improve ?Outcome: Progressing ?Goal: Respiratory complications will improve ?Outcome: Progressing ?Goal: Cardiovascular complication will be avoided ?Outcome: Progressing ?  ?Problem: Activity: ?Goal: Risk for activity intolerance will decrease ?Outcome: Progressing ?  ?Problem: Nutrition: ?Goal: Adequate nutrition will be maintained ?Outcome: Progressing ?  ?Problem: Coping: ?Goal: Level of anxiety will decrease ?Outcome: Progressing ?  ?Problem: Elimination: ?Goal: Will not experience complications related to bowel motility ?Outcome: Progressing ?Goal: Will not experience complications related to urinary retention ?Outcome: Progressing ?  ?Problem: Pain Managment: ?Goal: General experience of comfort will improve ?Outcome: Progressing ?  ?Problem: Safety: ?Goal: Ability to remain free from injury will improve ?Outcome: Progressing ?  ?Problem: Skin Integrity: ?Goal: Risk for impaired skin integrity will decrease ?Outcome: Progressing ?  ?Problem: Education: ?Goal: Knowledge of the prescribed therapeutic regimen will improve ?Outcome: Progressing ?Goal: Ability to verbalize activity precautions or restrictions will improve ?Outcome: Progressing ?Goal: Understanding of discharge needs will improve ?Outcome: Progressing ?  ?Problem: Activity: ?Goal: Ability to perform//tolerate increased activity and mobilize with assistive devices will improve ?Outcome: Progressing ?  ?Problem: Clinical Measurements: ?Goal: Postoperative complications will be avoided or minimized ?Outcome: Progressing ?  ?Problem: Self-Care: ?Goal: Ability to meet self-care needs will improve ?Outcome: Progressing ?  ?Problem: Self-Concept: ?Goal: Ability to maintain and perform role responsibilities to the fullest extent possible will improve ?Outcome: Progressing ?  ?Problem:  Pain Management: ?Goal: Pain level will decrease with appropriate interventions ?Outcome: Progressing ?  ?Problem: Skin Integrity: ?Goal: Demonstration of wound healing without infection will improve ?Outcome: Progressing ?  ?

## 2021-04-26 DIAGNOSIS — T8140XA Infection following a procedure, unspecified, initial encounter: Secondary | ICD-10-CM | POA: Diagnosis not present

## 2021-04-26 DIAGNOSIS — M86172 Other acute osteomyelitis, left ankle and foot: Secondary | ICD-10-CM | POA: Diagnosis not present

## 2021-04-26 DIAGNOSIS — M86252 Subacute osteomyelitis, left femur: Secondary | ICD-10-CM | POA: Diagnosis not present

## 2021-04-26 LAB — CBC
HCT: 27.5 % — ABNORMAL LOW (ref 39.0–52.0)
Hemoglobin: 9 g/dL — ABNORMAL LOW (ref 13.0–17.0)
MCH: 34.2 pg — ABNORMAL HIGH (ref 26.0–34.0)
MCHC: 32.7 g/dL (ref 30.0–36.0)
MCV: 104.6 fL — ABNORMAL HIGH (ref 80.0–100.0)
Platelets: 97 10*3/uL — ABNORMAL LOW (ref 150–400)
RBC: 2.63 MIL/uL — ABNORMAL LOW (ref 4.22–5.81)
RDW: 18.4 % — ABNORMAL HIGH (ref 11.5–15.5)
WBC: 13.1 10*3/uL — ABNORMAL HIGH (ref 4.0–10.5)
nRBC: 0.2 % (ref 0.0–0.2)

## 2021-04-26 LAB — BASIC METABOLIC PANEL
Anion gap: 9 (ref 5–15)
Anion gap: 8 (ref 5–15)
BUN: 19 mg/dL (ref 6–20)
BUN: 8 mg/dL (ref 6–20)
CO2: 25 mmol/L (ref 22–32)
CO2: 27 mmol/L (ref 22–32)
Calcium: 7.6 mg/dL — ABNORMAL LOW (ref 8.9–10.3)
Calcium: 7.6 mg/dL — ABNORMAL LOW (ref 8.9–10.3)
Chloride: 92 mmol/L — ABNORMAL LOW (ref 98–111)
Chloride: 98 mmol/L (ref 98–111)
Creatinine, Ser: 3.89 mg/dL — ABNORMAL HIGH (ref 0.61–1.24)
Creatinine, Ser: 2.44 mg/dL — ABNORMAL HIGH (ref 0.61–1.24)
GFR, Estimated: 18 mL/min — ABNORMAL LOW (ref >60)
GFR, Estimated: 31 mL/min — ABNORMAL LOW (ref >60)
Glucose, Bld: 98 mg/dL (ref 70–99)
Glucose, Bld: 96 mg/dL (ref 70–99)
Potassium: 3.9 mmol/L (ref 3.5–5.1)
Potassium: 3.6 mmol/L (ref 3.5–5.1)
Sodium: 126 mmol/L — ABNORMAL LOW (ref 135–145)
Sodium: 133 mmol/L — ABNORMAL LOW (ref 135–145)

## 2021-04-26 LAB — GLUCOSE, CAPILLARY
Glucose-Capillary: 110 mg/dL — ABNORMAL HIGH (ref 70–99)
Glucose-Capillary: 49 mg/dL — ABNORMAL LOW (ref 70–99)
Glucose-Capillary: 52 mg/dL — ABNORMAL LOW (ref 70–99)
Glucose-Capillary: 53 mg/dL — ABNORMAL LOW (ref 70–99)
Glucose-Capillary: 55 mg/dL — ABNORMAL LOW (ref 70–99)
Glucose-Capillary: 56 mg/dL — ABNORMAL LOW (ref 70–99)
Glucose-Capillary: 61 mg/dL — ABNORMAL LOW (ref 70–99)
Glucose-Capillary: 62 mg/dL — ABNORMAL LOW (ref 70–99)
Glucose-Capillary: 77 mg/dL (ref 70–99)
Glucose-Capillary: 77 mg/dL (ref 70–99)
Glucose-Capillary: 88 mg/dL (ref 70–99)

## 2021-04-26 LAB — HEPARIN LEVEL (UNFRACTIONATED): Heparin Unfractionated: <0.1 IU/mL — ABNORMAL LOW (ref 0.30–0.70)

## 2021-04-26 LAB — SURGICAL PATHOLOGY

## 2021-04-26 MED ORDER — DEXTROSE 50 % IV SOLN: 25.0000 g | INTRAVENOUS | Status: AC

## 2021-04-26 MED ORDER — MIDODRINE HCL 5 MG PO TABS
10.0000 mg | ORAL_TABLET | Freq: Two times a day (BID) | ORAL | Status: DC
  Administered 2021-04-26 – 2021-05-01 (×12): 10 mg via ORAL
  Filled 2021-04-26 (×13): qty 2

## 2021-04-26 MED ORDER — DEXTROSE 50 % IV SOLN
INTRAVENOUS | Status: AC
  Administered 2021-04-26: 25 mL
  Filled 2021-04-26: qty 50

## 2021-04-26 MED ORDER — DEXTROSE 50 % IV SOLN
25.0000 mL | INTRAVENOUS | Status: AC
Start: 2021-04-27 — End: 2021-04-26
  Administered 2021-04-26: 25 mL via INTRAVENOUS

## 2021-04-26 NOTE — Progress Notes (Signed)
Hypoglycemic Event ? ?CBG: 55 at 07:28  ? ?Treatment 4 oz OJ ? ?Symptoms: none ? ?Follow-up CBG: Time:07:54 CBG Result:62 ? ?Pt had just finished with breakfast tray ? ?Followed with another 4 oz of OJ ? ?Follow up CBG not completed. Pt being moved to procedural area for HD. HD nurse informed of event and advised to recheck CBG ? ? ?Brendan Campbell Brendan Campbell ? ? ?

## 2021-04-26 NOTE — Progress Notes (Signed)
2.9 mL dilaudid wasted from syringe with syringe replacement witnessed by Sharee Pimple, [place] charge nurse ?

## 2021-04-26 NOTE — Progress Notes (Signed)
OT Cancellation Note ? ?Patient Details ?Name: Brendan Campbell ?MRN: [id number] ?DOB: Oct 07, 1970 ? ? ?Cancelled Treatment:    Reason Eval/Treat Not Completed: Patient at procedure or test/unavailable (HD)  ? ?Lynnda Child, OTD, OTR/L ?Acute Rehab ?[phone] ? ? ?[name] ?04/26/2021, 9:19 AM ?

## 2021-04-26 NOTE — Progress Notes (Addendum)
IV tubing was leaking at connection where NS was hooked up to the PCA tubing.  All new tubing was hung, and PCA tubing was hand primed with Dilaudid syringe prior to hooking back up to patient.  Witnessed by [employer], RN.  1.8 mL of dilaudid was wasted from old tubing.  Witnessed by Jamal Collin, RN.  Brendan Campbell ? ?

## 2021-04-26 NOTE — Progress Notes (Signed)
PT Cancellation Note ? ?Patient Details ?Name: ZACKERY BRINE ?MRN: [id number] ?DOB: 1970/12/08 ? ? ?Cancelled Treatment:    Reason Eval/Treat Not Completed: Patient at procedure or test/unavailable (HD) ? ? ?Dynisha Due B Chattie Greeson ?04/26/2021, 8:55 AM ?Clydine Parkison P, PT ?Acute Rehabilitation Services ?Pager: [phone] ?Office: [phone] ? ? ? ?

## 2021-04-26 NOTE — Progress Notes (Signed)
removed 2028mls net fluid complaints of pain, itching, hungry thirsty, gave water, breakfast sandwich ,prn iv benadryl for itching.  gave midodrine and vanco mycin as ordered.  pre bp 131/78 post bp 104/84 pre weight 97.8kg post weight 94.9kg  2 bandages to lua no bleeding dressing cdi.   ?

## 2021-04-26 NOTE — Evaluation (Signed)
Occupational Therapy Evaluation ?Patient Details ?Name: Brendan Campbell ?MRN: [id number] ?DOB: 06-19-1970 ?Today's Date: 04/26/2021 ? ? ?History of Present Illness 51 yo admitted 3/14 from vascular office with infection. 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 ?Clinical Impression ?  ?Pt reports needing assistance at baseline with ADLs, uses w/c for functional mobility however cannot access all areas of his home in w/c. Lives with roommate and roommate's son, however her son can only assist sometimes. Pt reports using pull ups and sponge bathing at baseline, receives some assistance from roommate's son with ADLs. Pt currently mod - max A for ADLs, mod A for further bed mobility, transfers deferred at this time due to pt refusing, pain, and tachycardia. Pt HR in 110's at rest. Educated pt on positioning of RLE and keeping knee straight for healing. Pt with increased scrotal edema however declining elevation at this time, and per RN pt refusing scrotal sling as well. Pt presenting with impairments listed below, will follow acutely. Recommend SNF at d/c. ?   ? ?Recommendations for follow up therapy are one component of a multi-disciplinary discharge planning process, led by the attending physician.  Recommendations may be updated based on patient status, additional functional criteria and insurance authorization.  ? ?Follow Up Recommendations ? Skilled nursing-short term rehab (<3 hours/day)  ?  ?Assistance Recommended at Discharge Intermittent Supervision/Assistance  ?Patient can return home with the following A lot of help with walking and/or transfers;A lot of help with bathing/dressing/bathroom;Assist for transportation;Help with stairs or ramp for entrance;Assistance with cooking/housework ? ?  ?Functional Status Assessment ? Patient has had a recent decline in their functional status  and demonstrates the ability to make significant improvements in function in a reasonable and predictable amount of time.  ?Equipment Recommendations ? Other (comment) (drop arm BSC)  ?  ?Recommendations for Other Services PT consult ? ? ?  ?Precautions / Restrictions Precautions ?Precautions: Fall;Other (comment) ?Precaution Comments: watch HR ?Restrictions ?Weight Bearing Restrictions: Yes ?RLE Weight Bearing: Non weight bearing ?LLE Weight Bearing: Non weight bearing  ? ?  ? ?Mobility Bed Mobility ?Overal bed mobility: Needs Assistance ?  ?  ?  ?  ?  ?  ?General bed mobility comments: able to scoot self upward in bed minimally, pt declining sitting EOB due to pain, and IV at groin area ?  ? ?Transfers ?  ?  ?  ?  ?  ?  ?  ?  ?  ?General transfer comment: deferred, pt declining ?  ? ?  ?Balance Overall balance assessment: Needs assistance ?Sitting-balance support: Bilateral upper extremity supported ?Sitting balance-Leahy Scale: Good ?Sitting balance - Comments: supported in long sitting ?  ?  ?  ?  ?  ?  ?  ?  ?  ?  ?  ?  ?  ?  ?  ?   ? ?ADL either performed or assessed with clinical judgement  ? ?ADL Overall ADL's : Needs assistance/impaired ?Eating/Feeding: Set up;Bed level ?  ?Grooming: Minimal assistance;Bed level ?  ?Upper Body Bathing: Moderate assistance;Bed level ?  ?Lower Body Bathing: Moderate assistance;Maximal assistance;Bed level ?  ?Upper Body Dressing : Moderate assistance;Bed level ?  ?Lower Body Dressing: Moderate assistance;Maximal assistance;Bed level ?  ?Toilet Transfer: Maximal assistance;Anterior/posterior ?  ?Toileting- Clothing Manipulation and Hygiene: Maximal assistance ?  ?  ?  ?Functional mobility during ADLs: Maximal  assistance ?   ? ? ? ?Vision   ?Vision Assessment?: No apparent visual deficits  ?   ?Perception   ?  ?Praxis   ?  ? ?Pertinent Vitals/Pain Pain Assessment ?Pain Assessment: Faces ?Pain Score: 5  ?Faces Pain Scale: Hurts little more ?Pain Location: LLE with movement ?Pain  Descriptors / Indicators: Discomfort, Grimacing ?Pain Intervention(s): Limited activity within patient's tolerance, Monitored during session, Repositioned  ? ? ? ?Hand Dominance   ?  ?Extremity/Trunk Assessment Upper Extremity Assessment ?Upper Extremity Assessment: Overall WFL for tasks assessed ?  ?Lower Extremity Assessment ?Lower Extremity Assessment: Defer to PT evaluation ?  ?  ?  ?Communication Communication ?Communication: No difficulties ?  ?Cognition Arousal/Alertness: Awake/alert ?Behavior During Therapy: WFL for tasks assessed/performed, Agitated, Flat affect ?Overall Cognitive Status: Within Functional Limits for tasks assessed ?  ?  ?  ?  ?  ?  ?  ?  ?  ?  ?  ?  ?  ?  ?  ?  ?General Comments: pt agitated with mobility, not wanting to perform bed mobility despite max encouragement ?  ?  ?General Comments  HR in 110's at rest, RN aware. Provided education regarding positioning for RLE and importance of keeping it straight. Pt with increased scrotal edema, educated pt on keeping elevated, pt declining use of towel/other material to elevate while in bed. Per RN, pt declining scrotal sling also. ? ?  ?Exercises Exercises: General Upper Extremity ?General Exercises - Upper Extremity ?Shoulder Flexion: AROM, 5 reps, Both, Supine ?  ?Shoulder Instructions    ? ? ?Home Living Family/patient expects to be discharged to:: Private residence ?Living Arrangements: Other (Comment) (roommate) ?  ?Type of Home: Mobile home ?Home Access: Ramped entrance ?  ?  ?  ?  ?  ?Bathroom Shower/Tub: Tub/shower unit ?  ?Bathroom Toilet: Handicapped height ?Bathroom Accessibility: Yes ?How Accessible: Accessible via wheelchair (partially, cannot get w/c into bathroom) ?Home Equipment: Wheelchair - IT trainer (2 wheels);Hospital bed;Other (comment) (dialysis chair) ?  ?Additional Comments: pt's relative possibly coming from [facility] to take pt down there to recover once d/c'd. Pt lives with roommate and roommate's son,  roommate works night shift, cannot help. Reports roommates son has been helping with some ADLs, reports he "does what he can" ?  ? ?  ?Prior Functioning/Environment Prior Level of Function : Needs assist ?  ?  ?  ?Physical Assist : Mobility (physical) ?  ?  ?Mobility Comments: uses w/c ?ADLs Comments: wears pull ups for toileting and sponge bathes ?  ? ?  ?  ?OT Problem List: Decreased strength;Decreased range of motion;Decreased activity tolerance;Impaired balance (sitting and/or standing);Decreased safety awareness;Decreased knowledge of use of DME or AE;Decreased knowledge of precautions;Cardiopulmonary status limiting activity;Pain;Increased edema ?  ?   ?OT Treatment/Interventions: Self-care/ADL training;Therapeutic exercise;Energy conservation;DME and/or AE instruction;Therapeutic activities;Patient/family education;Balance training  ?  ?OT Goals(Current goals can be found in the care plan section) Acute Rehab OT Goals ?Patient Stated Goal: decrease pain ?OT Goal Formulation: With patient ?Time For Goal Achievement: 05/10/21 ?Potential to Achieve Goals: Fair ?ADL Goals ?Pt Will Perform Upper Body Dressing: with min assist;sitting;standing ?Pt Will Perform Lower Body Dressing: with mod assist;with min assist;sitting/lateral leans ?Pt Will Transfer to Toilet: with mod assist;anterior/posterior transfer;with transfer board;bedside commode  ?OT Frequency: Min 2X/week ?  ? ?Co-evaluation   ?  ?  ?  ?  ? ?  ?AM-PAC OT "6 Clicks" Daily Activity     ?Outcome Measure Help from another person eating  meals?: None ?Help from another person taking care of personal grooming?: A Little ?Help from another person toileting, which includes using toliet, bedpan, or urinal?: Total ?Help from another person bathing (including washing, rinsing, drying)?: Total ?Help from another person to put on and taking off regular upper body clothing?: A Lot ?Help from another person to put on and taking off regular lower body clothing?:  Total ?6 Click Score: 12 ?  ?End of Session   ? ?Activity Tolerance:   ?Patient left:   ? ?OT Visit Diagnosis: Unsteadiness on feet (R26.81);Other abnormalities of gait and mobility (R26.89);Muscle weakness

## 2021-04-26 NOTE — Progress Notes (Signed)
Pt still hypoglycemic,but asymptomatic. Fingerstick CBG questionable due to poor vasculature. MD notified. BMP ordered. ?

## 2021-04-26 NOTE — Progress Notes (Signed)
Pt BLE and scrotal/perineal edema has continued to worsen overnight. Dr. Marlowe Sax notified. New orders received to stop IVF and check CBG q2h. HD department contacted to try to get pt in for HD session early this morning if possible.  ?

## 2021-04-26 NOTE — Progress Notes (Signed)
Dr. Marlowe Sax notified of increasing edema in scrotal area since shift change. Scrotum is currently elevated. IVF D10@50ml /hr. No new orders.  ?

## 2021-04-26 NOTE — Progress Notes (Addendum)
?PROGRESS NOTE ? ? ? ?Brendan Campbell  ZTI:[id number] DOB: [birth year] DOA: 04/20/2021 ?PCP: Monico Blitz, MD  ?[place] y.o. 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  ?Patient underwent left leg BKA on 02/16/2021.  In February, he started having pain at the BKA site with some foul-smelling discharge. On 3/14, he was seen by vascular surgery in the office.  There was concern of deep tissue infection and hence patient was sent to the ED.   ?3/16, patient underwent revision of left AKA from BKA, debridement right AKA. ?Because of intraoperative hypotension, patient required pressors and was briefly admitted to ICU, subsequently BP stabilized and transferred to  [facility] service. ?-Was started on D10 drip and PCA pump ? ? ?Subjective: ?-Seen on dialysis, uncomfortable, pain at surgical sites, breathing okay for the most part, bothered by significant scrotal swelling ? ?Assessment & Plan: ? ?Left BKA stump infected ulcer ?History of left BKA in January 2023 ?-Presented with wound infection, very poor hygiene, abnormal hair found in wound ?-3/16, underwent left AKA, converted from BKA, debridement of right BKA ?-Vascular surgery following, remains on Dilaudid PCA, transition to p.o. meds tomorrow ?-Continue wound care of right BKA ?-WBC starting to improve, will discontinue antibiotics today, blood cultures are negative ?-PT eval, anticipate need for SNF ?  ?Hypotension ?-Developed intraoperative hypotension, likely secondary to sedation, pain meds  ?-Stable off pressors, monitor with HD ?-Continue midodrine with HD ? ?Right forearm hematoma/ecchymosis ?-Hold IV heparin 1 more day, fortunately he is in sinus rhythm ?-Hemoglobin now stable,, restart anticoagulation tomorrow ?  ?ESRD HD MWF ?Hyperkalemia ?Volume overload, scrotal edema ?-Nephrology following.    ?-Dialysis today, needs more volume removed ?-Now on midodrine ?  ?Chronic combined systolic and diastolic CHF  ?-Volume managed with HD ?  ?Paroxysmal A-fib ?-Continue amiodarone ?-Eliquis, heparin on hold, restart Eliquis tomorrow if stable ?  ?Type 2 diabetes mellitus with hypOglycemia ?-A1c 5.3 on 3/14 ?-Was on D10 gtt. for 48 hours, weaned off today, encourage supplements ?-on further eval, his Bmet and fingersticks do not correlate -remains completely asymptomatic when CBGs are low, check STAT BMET next time CBG drops ? ?COPD/chronic respiratory failure ?-Has been using 2 L O2 as needed for few years ?-Not volume overloaded, continue pulmonary toilet ?  ?Peripheral artery disease ?s/p prior bilateral BKA ?-PTA, patient was on aspirin, Eliquis, Lipitor.  ?-Eliquis and aspirin on hold.  continue statin ?  ?Anemia secondary to CKD ?-H&H stable. Continue systemic anticoagulation. ?-Continue to monitor ?  ?DVT prophylaxis: Heparin now on hold ?Code Status: Full code ?Family Communication: Discussed with patient in detail, no family at bedside ?Disposition Plan: Anticipate need for SNF ? ?Consultants:  ?Nephrology, vascular surgery ? ?Procedures: Revision of left BKA to AKA, debridement of right BKA ? ?Antimicrobials:  ? ? ?Objective: ?Vitals:  ? 04/26/21 1100 04/26/21 1130 04/26/21 1200 04/26/21 1331  ?BP: 102/69 117/75 110/74   ?Pulse: (!) 103 (!) 106 (!) 110   ?Resp: 13 10 19 12   ?Temp:      ?TempSrc:      ?SpO2: 100% 100% 99% 100%  ?Weight:      ?Height:      ? ? ?Intake/Output Summary (Last 24 hours) at 04/26/2021 1337 ?Last data filed at 04/26/2021 1246 ?Gross per 24 hour  ?Intake 2082.54 ml  ?Output 2000 ml  ?Net 82.54 ml  ? ?Filed Weights  ?  04/24/21 0900 04/25/21 1244 04/26/21 0823  ?Weight: 97 kg 95.9 kg 97.8 kg  ? ? ?Examination: ? ?General exam: Chronically ill obese male, laying in bed, AAOx3 ?HEENT: Neck obese unable to assess JVD ?CVS: S1-S2, regular rhythm ?Lungs: Decreased breath sounds to  bases ?Abdomen: Soft, obese, nontender, bowel sounds present, ?GU: Significant scrotal edema ?Extremities: Right BKA with dressing, left AKA with dressing , extensive bruising/ecchymosis noted in right forearm, elbow ?Right femoral central line ?Skin: Scabs throughout his upper and lower extremities ? ? ?Data Reviewed:  ? ?CBC: ?Recent Labs  ?Lab 04/20/21 ?1234 04/21/21 ?0110 04/22/21 ?0129 04/22/21 ?1449 04/22/21 ?1736 04/23/21 ?4431 04/24/21 ?5400 04/25/21 ?8676 04/26/21 ?1950  ?WBC 9.7   < > 10.7*  --  10.2 12.6* 16.4* 16.2* 13.1*  ?NEUTROABS 8.2*  --  8.6*  --   --  12.0* 14.9*  --   --   ?HGB 11.6*   < > 11.7*   < > 10.2* 10.4* 10.2* 9.2* 9.0*  ?HCT 36.4*   < > 34.9*   < > 30.7* 31.0* 30.6* 28.0* 27.5*  ?MCV 104.9*   < > 102.3*  --  102.7* 103.7* 104.4* 104.5* 104.6*  ?PLT 165   < > 125*  --  142* 147* 151 123* 97*  ? < > = values in this interval not displayed.  ? ?Basic Metabolic Panel: ?Recent Labs  ?Lab 04/22/21 ?0129 04/22/21 ?1449 04/22/21 ?1736 04/23/21 ?9326 04/24/21 ?7124 04/25/21 ?0214 04/26/21 ?5809  ?NA 133*   < > 132* 130* 132* 130* 126*  ?K 3.9   < > 3.8 4.0 4.2 3.4* 3.9  ?CL 97*  --  97* 97* 98 96* 92*  ?CO2 22  --  21* 18* 22 26 25   ?GLUCOSE 67*  --  106* 145* 71 148* 98  ?BUN 21*  --  23* 23* 28* 15 19  ?CREATININE 4.28*  --  4.81* 4.93* 5.36* 3.54* 3.89*  ?CALCIUM 7.8*  --  8.4* 8.4* 8.4* 7.8* 7.6*  ?PHOS 4.8*  --  5.8*  --   --   --   --   ? < > = values in this interval not displayed.  ? ?GFR: ?Estimated Creatinine Clearance: 26.6 mL/min (A) (by C-G formula based on SCr of 3.89 mg/dL (H)). ?Liver Function Tests: ?Recent Labs  ?Lab 04/20/21 ?1234 04/22/21 ?0129 04/22/21 ?1736  ?AST 24  --   --   ?ALT 17  --   --   ?ALKPHOS 106  --   --   ?BILITOT 1.6*  --   --   ?PROT 5.6*  --   --   ?ALBUMIN 2.1* 2.1* 2.2*  ? ?No results for input(s): LIPASE, AMYLASE in the last 168 hours. ?No results for input(s): AMMONIA in the last 168 hours. ?Coagulation Profile: ?No results for input(s): INR, PROTIME in  the last 168 hours. ?Cardiac Enzymes: ?No results for input(s): CKTOTAL, CKMB, CKMBINDEX, TROPONINI in the last 168 hours. ?BNP (last 3 results) ?No results for input(s): PROBNP in the last 8760 hours. ?HbA1C: ?No results for input(s): HGBA1C in the last 72 hours. ?CBG: ?Recent Labs  ?Lab 04/26/21 ?0045 04/26/21 ?9833 04/26/21 ?8250 04/26/21 ?5397 04/26/21 ?6734  ?GLUCAP 88 77 52* 55* 62*  ? ?Lipid Profile: ?No results for input(s): CHOL, HDL, LDLCALC, TRIG, CHOLHDL, LDLDIRECT in the last 72 hours. ?Thyroid Function Tests: ?No results for input(s): TSH, T4TOTAL, FREET4, T3FREE, THYROIDAB in the last 72 hours. ?Anemia Panel: ?No results for input(s): VITAMINB12, FOLATE, FERRITIN, TIBC,  IRON, RETICCTPCT in the last 72 hours. ?Urine analysis: ?No results found for: COLORURINE, APPEARANCEUR, [place], [place], [place], [place], [place], KETONESUR, PROTEINUR, [place], NITRITE, LEUKOCYTESUR ?Sepsis Labs: ?@LABRCNTIP (procalcitonin:4,lacticidven:4) ? ?) ?Recent Results (from the past 240 hour(s))  ?Resp Panel by RT-PCR (Flu A&B, Covid) Nasopharyngeal Swab     Status: None  ? Collection Time: 04/20/21 12:54 PM  ? Specimen: Nasopharyngeal Swab; Nasopharyngeal(NP) swabs in vial transport medium  ?Result Value Ref Range Status  ? SARS Coronavirus 2 by RT PCR NEGATIVE NEGATIVE Final  ?  Comment: (NOTE) ?SARS-CoV-2 target nucleic acids are NOT DETECTED. ? ?The SARS-CoV-2 RNA is generally detectable in upper respiratory ?specimens during the acute phase of infection. The lowest ?concentration of SARS-CoV-2 viral copies this assay can detect is ?138 copies/mL. A negative result does not preclude SARS-Cov-2 ?infection and should not be used as the sole basis for treatment or ?other patient management decisions. A negative result may occur with  ?improper specimen collection/handling, submission of specimen other ?than nasopharyngeal swab, presence of viral mutation(s) within the ?areas targeted by this assay, and inadequate  number of viral ?copies(<138 copies/mL). A negative result must be combined with ?clinical observations, patient history, and epidemiological ?information. The expected result is Negative. ? ?Fact Sheet for Patients:  ?h

## 2021-04-26 NOTE — Progress Notes (Signed)
?[facility] ?Progress Note  ? ?Subjective:   patient seen and examined bedside on HD. No acute events. Still with scrotal edema but no SOB. Tolerating HD well. ? ?Objective ?Vitals:  ? 04/26/21 0930 04/26/21 1000 04/26/21 1030 04/26/21 1100  ?BP: 101/69 115/69 115/74 102/69  ?Pulse: (!) 103 (!) 106 (!) 104 (!) 103  ?Resp: 10 20 14 13   ?Temp:      ?TempSrc:      ?SpO2: 100% 100% 100% 100%  ?Weight:      ?Height:      ? ?Physical Exam ?General appearance: Chronically ill-appearing, lying in bed, no distress ?Resp: Bilateral chest rise with no increased work of breathing ?Cardio: regular rate and rhythm ?GI: soft, non-tender ?Extremities: right bka and left aka, some mild lower extremity edema present bilaterally ?GU: +scrotal edema ?  ? ?Additional Objective ?Labs: ?Basic Metabolic Panel: ?Recent Labs  ?Lab 04/22/21 ?0129 04/22/21 ?1449 04/22/21 ?1736 04/23/21 ?4034 04/24/21 ?7425 04/25/21 ?0214 04/26/21 ?9563  ?NA 133*   < > 132*   < > 132* 130* 126*  ?K 3.9   < > 3.8   < > 4.2 3.4* 3.9  ?CL 97*  --  97*   < > 98 96* 92*  ?CO2 22  --  21*   < > 22 26 25   ?GLUCOSE 67*  --  106*   < > 71 148* 98  ?BUN 21*  --  23*   < > 28* 15 19  ?CREATININE 4.28*  --  4.81*   < > 5.36* 3.54* 3.89*  ?CALCIUM 7.8*  --  8.4*   < > 8.4* 7.8* 7.6*  ?PHOS 4.8*  --  5.8*  --   --   --   --   ? < > = values in this interval not displayed.  ? ?Liver Function Tests: ?Recent Labs  ?Lab 04/20/21 ?1234 04/22/21 ?0129 04/22/21 ?1736  ?AST 24  --   --   ?ALT 17  --   --   ?ALKPHOS 106  --   --   ?BILITOT 1.6*  --   --   ?PROT 5.6*  --   --   ?ALBUMIN 2.1* 2.1* 2.2*  ? ?No results for input(s): LIPASE, AMYLASE in the last 168 hours. ?CBC: ?Recent Labs  ?Lab 04/22/21 ?0129 04/22/21 ?1449 04/22/21 ?1736 04/23/21 ?8756 04/24/21 ?4332 04/25/21 ?9518 04/26/21 ?8416  ?WBC 10.7*  --  10.2 12.6* 16.4* 16.2* 13.1*  ?NEUTROABS 8.6*  --   --  12.0* 14.9*  --   --   ?HGB 11.7*   < > 10.2* 10.4* 10.2* 9.2* 9.0*  ?HCT 34.9*   < > 30.7* 31.0*  30.6* 28.0* 27.5*  ?MCV 102.3*  --  102.7* 103.7* 104.4* 104.5* 104.6*  ?PLT 125*  --  142* 147* 151 123* 97*  ? < > = values in this interval not displayed.  ? ?Blood Culture ?   ?Component Value Date/Time  ? SDES BLOOD RIGHT FOREARM 04/20/2021 1322  ? SPECREQUEST  04/20/2021 1322  ?  BOTTLES DRAWN AEROBIC AND ANAEROBIC Blood Culture adequate volume  ? CULT  04/20/2021 1322  ?  NO GROWTH 5 DAYS ?Performed at [facility], [place] [address], [place], [place] [address] ?  ? REPTSTATUS 04/25/2021 FINAL 04/20/2021 1322  ? ? ?Cardiac Enzymes: ?No results for input(s): CKTOTAL, CKMB, CKMBINDEX, TROPONINI in the last 168 hours. ?CBG: ?Recent Labs  ?Lab 04/26/21 ?0045 04/26/21 ?1601 04/26/21 ?0932 04/26/21 ?3557 04/26/21 ?3220  ?  GLUCAP 88 77 52* 55* 62*  ? ?Iron Studies: No results for input(s): IRON, TIBC, TRANSFERRIN, FERRITIN in the last 72 hours. ?@lablastinr3 @ ?Studies/Results: ?No results found. ?Medications: ? sodium chloride Stopped (04/26/21 0122)  ? sodium chloride    ? ceFEPime (MAXIPIME) IV Stopped (04/25/21 2322)  ? metronidazole Stopped (04/26/21 0222)  ? promethazine (PHENERGAN) injection (IM or IVPB) Stopped (04/23/21 0325)  ? vancomycin 1,000 mg (04/26/21 1038)  ? ? amiodarone  200 mg Oral Daily  ? vitamin C  250 mg Oral BID  ? atorvastatin  10 mg Oral Q M,W,F  ? calcium acetate  667 mg Oral TID WC  ? cholecalciferol  1,000 Units Oral Q M,W,F  ? feeding supplement  237 mL Oral TID BM  ? feeding supplement (NEPRO CARB STEADY)  237 mL Oral TID BM  ? HYDROmorphone   Intravenous Q4H  ? leptospermum manuka honey  1 application. Topical Daily  ? midodrine  10 mg Oral BID WC  ? multivitamin  1 tablet Oral QHS  ? mupirocin ointment  1 application. Nasal BID  ? pantoprazole  40 mg Oral Daily  ? sodium zirconium cyclosilicate  10 g Oral Once  ? zinc sulfate  220 mg Oral Daily  ? ? ?Dialysis Orders:  [name]  on MWF ? 4h 56min  95 kg   1K/2.25 bath  Hep 1000+ 500u/hr  LUE AVG  300/500   ?- prosthetic  weighs 1.6 kg ? - Epogen 1000   Units IV/HD   ?  ?Assessment/Plan ?**stump infection:  broad spectrum antibiotics.  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  Complicated by hypotension but now more stable.  Management per vascular ?  ?**ESRD on HD:  HD per usual schedule MWF - heparin per outpt held due to systemic heparin gtt for periop a fib anticoag.  HD on MWF schedule. Will reassess volume status tomorrow after HD today to see if he will need an extra treatment for UF--patient is agreeable to this ?  ?**Anemia:  Hb in 10-11, follow, transfuse prn for hgb <7  On low dose epogen outpt. Hgb down to 9 today ?  ?**Nutrition: supplements ?  ?**HL: statin   ? ?**PAF: heparin gtt bridgin eliquis, mgmt per primary ? ?Gean Quint, MD ?[facility] ?

## 2021-04-27 DIAGNOSIS — M86172 Other acute osteomyelitis, left ankle and foot: Secondary | ICD-10-CM | POA: Diagnosis not present

## 2021-04-27 DIAGNOSIS — M86252 Subacute osteomyelitis, left femur: Secondary | ICD-10-CM | POA: Diagnosis not present

## 2021-04-27 DIAGNOSIS — T8140XA Infection following a procedure, unspecified, initial encounter: Secondary | ICD-10-CM | POA: Diagnosis not present

## 2021-04-27 LAB — CBC
HCT: 25.5 % — ABNORMAL LOW (ref 39.0–52.0)
Hemoglobin: 8.6 g/dL — ABNORMAL LOW (ref 13.0–17.0)
MCH: 34.8 pg — ABNORMAL HIGH (ref 26.0–34.0)
MCHC: 33.7 g/dL (ref 30.0–36.0)
MCV: 103.2 fL — ABNORMAL HIGH (ref 80.0–100.0)
Platelets: 90 10*3/uL — ABNORMAL LOW (ref 150–400)
RBC: 2.47 MIL/uL — ABNORMAL LOW (ref 4.22–5.81)
RDW: 18.5 % — ABNORMAL HIGH (ref 11.5–15.5)
WBC: 12.7 10*3/uL — ABNORMAL HIGH (ref 4.0–10.5)
nRBC: 0 % (ref 0.0–0.2)

## 2021-04-27 LAB — COMPREHENSIVE METABOLIC PANEL
ALT: 14 U/L (ref 0–44)
AST: 18 U/L (ref 15–41)
Albumin: 1.8 g/dL — ABNORMAL LOW (ref 3.5–5.0)
Alkaline Phosphatase: 76 U/L (ref 38–126)
Anion gap: 8 (ref 5–15)
BUN: 10 mg/dL (ref 6–20)
CO2: 26 mmol/L (ref 22–32)
Calcium: 7.6 mg/dL — ABNORMAL LOW (ref 8.9–10.3)
Chloride: 98 mmol/L (ref 98–111)
Creatinine, Ser: 2.97 mg/dL — ABNORMAL HIGH (ref 0.61–1.24)
GFR, Estimated: 25 mL/min — ABNORMAL LOW (ref >60)
Glucose, Bld: 56 mg/dL — ABNORMAL LOW (ref 70–99)
Potassium: 3.7 mmol/L (ref 3.5–5.1)
Sodium: 132 mmol/L — ABNORMAL LOW (ref 135–145)
Total Bilirubin: 1.7 mg/dL — ABNORMAL HIGH (ref 0.3–1.2)
Total Protein: 4.7 g/dL — ABNORMAL LOW (ref 6.5–8.1)

## 2021-04-27 LAB — GLUCOSE, CAPILLARY
Glucose-Capillary: 83 mg/dL (ref 70–99)
Glucose-Capillary: 92 mg/dL (ref 70–99)
Glucose-Capillary: 54 mg/dL — ABNORMAL LOW (ref 70–99)
Glucose-Capillary: 61 mg/dL — ABNORMAL LOW (ref 70–99)
Glucose-Capillary: 65 mg/dL — ABNORMAL LOW (ref 70–99)
Glucose-Capillary: 101 mg/dL — ABNORMAL HIGH (ref 70–99)

## 2021-04-27 MED ORDER — OXYCODONE HCL 5 MG PO TABS
5.0000 mg | ORAL_TABLET | Freq: Four times a day (QID) | ORAL | Status: DC | PRN
  Administered 2021-04-27 – 2021-05-01 (×12): 10 mg via ORAL
  Filled 2021-04-27 (×13): qty 2

## 2021-04-27 MED ORDER — FENTANYL CITRATE PF 50 MCG/ML IJ SOSY
25.0000 ug | PREFILLED_SYRINGE | INTRAMUSCULAR | Status: DC
  Administered 2021-04-27 – 2021-04-28 (×4): 25 ug via INTRAVENOUS
  Filled 2021-04-27 (×5): qty 1

## 2021-04-27 MED ORDER — ALBUMIN HUMAN 25 % IV SOLN
INTRAVENOUS | Status: AC
Start: 2021-04-27 — End: 2021-04-28
  Filled 2021-04-27: qty 100

## 2021-04-27 MED ORDER — APIXABAN 5 MG PO TABS
5.0000 mg | ORAL_TABLET | Freq: Two times a day (BID) | ORAL | Status: DC
  Administered 2021-04-28 – 2021-05-01 (×8): 5 mg via ORAL
  Filled 2021-04-27 (×8): qty 1

## 2021-04-27 MED ORDER — ALBUMIN HUMAN 25 % IV SOLN
25.0000 g | Freq: Once | INTRAVENOUS | Status: AC
  Administered 2021-04-27: 25 g via INTRAVENOUS

## 2021-04-27 MED ORDER — DARBEPOETIN ALFA 100 MCG/0.5ML IJ SOSY
100.0000 ug | PREFILLED_SYRINGE | INTRAMUSCULAR | Status: DC
  Administered 2021-04-28: 100 ug via INTRAVENOUS
  Filled 2021-04-27 (×2): qty 0.5

## 2021-04-27 NOTE — Progress Notes (Signed)
OT Cancellation Note ? ?Patient Details ?Name: Brendan Campbell ?MRN: [id number] ?DOB: 1971-01-02 ? ? ?Cancelled Treatment:    Reason Eval/Treat Not Completed: Patient at procedure or test/ unavailable (transport on the way to take pt to HD) ? ?Lynnda Child, OTD, OTR/L ?Acute Rehab ?[phone] ? ?[name] ?04/27/2021, 1:29 PM ?

## 2021-04-27 NOTE — Progress Notes (Signed)
?  Progress Note ? ? ? ?04/27/2021 ?1:46 PM ?5 Days Post-Op ? ?Subjective:  c/o pain with removal of dressing ? ?afebrile ?Vitals:  ? 04/27/21 1128 04/27/21 1221  ?BP: 98/79 98/79  ?Pulse: (!) 106 (!) 106  ?Resp: 16 16  ?Temp: 98.1 ?F (36.7 ?C) 98.1 ?F (36.7 ?C)  ?SpO2: 97%   ? ? ?Physical Exam: ?Incisions:   ?Left AKA: ? ? ?Right BKA: ? ? ? ?CBC ?   ?Component Value Date/Time  ? WBC 12.7 (H) 04/27/2021 0529  ? RBC 2.47 (L) 04/27/2021 0529  ? HGB 8.6 (L) 04/27/2021 0529  ? HGB 10.4 (L) 06/02/2016 1107  ? HCT 25.5 (L) 04/27/2021 0529  ? HCT 32.9 (L) 06/02/2016 1107  ? PLT 90 (L) 04/27/2021 0529  ? PLT 389 (H) 06/02/2016 1107  ? MCV 103.2 (H) 04/27/2021 0529  ? MCV 94 06/02/2016 1107  ? MCH 34.8 (H) 04/27/2021 0529  ? MCHC 33.7 04/27/2021 0529  ? RDW 18.5 (H) 04/27/2021 0529  ? RDW 16.0 (H) 06/02/2016 1107  ? LYMPHSABS 0.8 04/24/2021 0420  ? LYMPHSABS 2.9 06/02/2016 1107  ? MONOABS 0.6 04/24/2021 0420  ? EOSABS 0.0 04/24/2021 0420  ? EOSABS 0.5 (H) 06/02/2016 1107  ? BASOSABS 0.0 04/24/2021 0420  ? BASOSABS 0.1 06/02/2016 1107  ? ? ?BMET ?   ?Component Value Date/Time  ? NA 132 (L) 04/27/2021 0529  ? NA 142 12/16/2015 1051  ? K 3.7 04/27/2021 0529  ? CL 98 04/27/2021 0529  ? CO2 26 04/27/2021 0529  ? GLUCOSE 56 (L) 04/27/2021 0529  ? BUN 10 04/27/2021 0529  ? BUN 45 (H) 12/16/2015 1051  ? CREATININE 2.97 (H) 04/27/2021 0529  ? CALCIUM 7.6 (L) 04/27/2021 0529  ? GFRNONAA 25 (L) 04/27/2021 0529  ? GFRAA 9 (L) 09/27/2019 0951  ? ? ?INR ?   ?Component Value Date/Time  ? INR 1.4 (H) 02/13/2021 0304  ? ? ? ?Intake/Output Summary (Last 24 hours) at 04/27/2021 1346 ?Last data filed at 04/26/2021 2130 ?Gross per 24 hour  ?Intake 340 ml  ?Output 0 ml  ?Net 340 ml  ? ? ? ? ?Assessment/Plan:  51 y.o. male is s/p left above knee amputation and debridement of right BKA ? 5 Days Post-Op ? ?- Dressing removed from left stump and it is viable.  Replaced dressing.   ?Will need to be protected from being soiled - recommend 4x4 and Tegaderms  across wound  ?- Right BKA dressing per wound care nursing ?- Appt to be arranged for follow up to have sutures removed ?- Recommend broad spectrum antibiotics ? ? ?Leontine Locket, PA-C ?[facility] ?[phone] ?04/27/2021 ?1:46 PM ? ? ? ? ? ? ? ?  ? ?

## 2021-04-27 NOTE — Evaluation (Signed)
Physical Therapy Evaluation ?Patient Details ?Name: Brendan Campbell ?MRN: [id number] ?DOB: 08-11-1970 ?Today's Date: 04/27/2021 ? ?History of Present Illness ? 51 yo admitted 3/14 from vascular office with infection. 3/16 pt s/p Left BKA>AKA and Rt BKA debridement. 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 Pt reports pain, itching and edema limiting function this session. Pt with bil LE and scrotal edema as well as wounds throughout UE and trunk with bruising right hip and RUE. Pt reports having to have assist at home to perform transfers with assist to pull up briefs and pants but that he does not have 24hr assist at home. Pt with very limited mobility this session able to achieve long sitting but could not scoot in any direction to progress to transfers. Pt agreeable to attempt increased mobility with premedication and agreeable to ST-SNF. Pt with decreased function, mobility and skin integrity who will benefit from acute therapy to maximize mobility and safety.    ?   ? ?Recommendations for follow up therapy are one component of a multi-disciplinary discharge planning process, led by the attending physician.  Recommendations may be updated based on patient status, additional functional criteria and insurance authorization. ? ?Follow Up Recommendations Skilled nursing-short term rehab (<3 hours/day) ? ?  ?Assistance Recommended at Discharge Frequent or constant Supervision/Assistance  ?Patient can return home with the following ? A lot of help with walking and/or transfers;A lot of help with bathing/dressing/bathroom;Assist for transportation;Assistance with cooking/housework;Direct supervision/assist for financial management ? ?  ?Equipment Recommendations None recommended by PT  ?Recommendations for Other Services ?    ?  ?Functional Status Assessment Patient has had a recent decline in their functional status  and/or demonstrates limited ability to make significant improvements in function in a reasonable and predictable amount of time  ? ?  ?Precautions / Restrictions Precautions ?Precautions: Fall;Other (comment) ?Precaution Comments: right fem line ?Restrictions ?Weight Bearing Restrictions: Yes ?RLE Weight Bearing: Non weight bearing ?LLE Weight Bearing: Non weight bearing  ? ?  ? ?Mobility ? Bed Mobility ?Overal bed mobility: Needs Assistance ?Bed Mobility: Supine to Sit ?  ?  ?Supine to sit: Min assist, HOB elevated ?  ?  ?General bed mobility comments: HOB 20 degrees with hand held assist to pull trunk into long sitting. pt unable to perform posterior or lateral scoot from seated position with assist of pad due to weakness and pain. Sitting balance fair without UE support ?  ? ?Transfers ?  ?  ?  ?  ?  ?  ?  ?  ?  ?General transfer comment: unable ?  ? ?Ambulation/Gait ?  ?  ?  ?  ?  ?  ?  ?  ? ?Stairs ?  ?  ?  ?  ?  ? ?Wheelchair Mobility ?  ? ?Modified Rankin (Stroke Patients Only) ?  ? ?  ? ?Balance   ?Sitting-balance support: No upper extremity supported ?Sitting balance-Leahy Scale: Good ?Sitting balance - Comments: in long sitting ?  ?  ?  ?  ?  ?  ?  ?  ?  ?  ?  ?  ?  ?  ?  ?   ? ? ? ?Pertinent Vitals/Pain Pain Assessment ?Pain Score: 6  ?Pain Location: LLE, hips and back ?Pain Descriptors / Indicators: Discomfort, Grimacing, Burning ?Pain Intervention(s): Limited activity within patient's tolerance, Monitored during session, Repositioned, RN gave pain meds during  session  ? ? ?Home Living Family/patient expects to be discharged to:: Private residence ?Living Arrangements: Other (Comment);Non-relatives/Friends ?Available Help at Discharge: Friend(s);Available PRN/intermittently ?Type of Home: Mobile home ?Home Access: Ramped entrance ?  ?  ?  ?Home Layout: One level ?Home Equipment: Wheelchair - IT trainer (2 wheels);Hospital bed;Other (comment) ?Additional Comments: lives with roommate  and roommate's son. Son assists but is at home all day playing video games but unable to assist more  ?  ?Prior Function Prior Level of Function : Needs assist ?  ?  ?  ?Physical Assist : Mobility (physical) ?  ?  ?Mobility Comments: uses w/c, physical assist for transfers at times ?ADLs Comments: wears pull ups for toileting and sponge bathes ?  ? ? ?Hand Dominance  ?   ? ?  ?Extremity/Trunk Assessment  ? Upper Extremity Assessment ?Upper Extremity Assessment: Generalized weakness ?  ? ?Lower Extremity Assessment ?Lower Extremity Assessment: Generalized weakness;RLE deficits/detail ?RLE Deficits / Details: dressing off end of residual limb with noted wounds, pt removed dressing due to itching. Pt able to achieve knee extension ?  ? ?Cervical / Trunk Assessment ?Cervical / Trunk Assessment: Other exceptions ?Cervical / Trunk Exceptions: wounds throughout trunk and extremities with bruising right hip and RUE  ?Communication  ? Communication: No difficulties  ?Cognition Arousal/Alertness: Awake/alert ?Behavior During Therapy: [facility] for tasks assessed/performed ?Overall Cognitive Status: Impaired/Different from baseline ?Area of Impairment: Problem solving, Safety/judgement ?  ?  ?  ?  ?  ?  ?  ?  ?  ?  ?  ?  ?Safety/Judgement: Decreased awareness of deficits, Decreased awareness of safety ?  ?Problem Solving: Slow processing ?  ?  ?  ? ?  ?General Comments   ? ?  ?Exercises    ? ?Assessment/Plan  ?  ?PT Assessment Patient needs continued PT services  ?PT Problem List Decreased strength;Decreased mobility;Decreased range of motion;Decreased activity tolerance;Decreased balance;Decreased cognition ? ?   ?  ?PT Treatment Interventions DME instruction;Therapeutic exercise;Functional mobility training;Therapeutic activities;Patient/family education   ? ?PT Goals (Current goals can be found in the Care Plan section)  ?Acute Rehab PT Goals ?Patient Stated Goal: be able to return home ?PT Goal Formulation: With patient ?Time  For Goal Achievement: 05/11/21 ?Potential to Achieve Goals: Fair ? ?  ?Frequency Min 3X/week ?  ? ? ?Co-evaluation   ?  ?  ?  ?  ? ? ?  ?AM-PAC PT "6 Clicks" Mobility  ?Outcome Measure Help needed turning from your back to your side while in a flat bed without using bedrails?: A Little ?Help needed moving from lying on your back to sitting on the side of a flat bed without using bedrails?: A Lot ?Help needed moving to and from a bed to a chair (including a wheelchair)?: Total ?Help needed standing up from a chair using your arms (e.g., wheelchair or bedside chair)?: Total ?Help needed to walk in hospital room?: Total ?Help needed climbing 3-5 steps with a railing? : Total ?6 Click Score: 9 ? ?  ?End of Session   ?Activity Tolerance: Patient limited by pain ?Patient left: in bed;with call bell/phone within reach;with nursing/sitter in room ?Nurse Communication: Mobility status;Need for lift equipment ?PT Visit Diagnosis: Other abnormalities of gait and mobility (R26.89);Muscle weakness (generalized) (M62.81) ?  ? ?Time: 2878-6767 ?PT Time Calculation (min) (ACUTE ONLY): 11 min ? ? ?Charges:   PT Evaluation ?$PT Eval Moderate Complexity: 1 Mod ?  ?  ?   ? ? ?Oaklee Esther P, PT ?  Acute Rehabilitation Services ?Pager: [phone] ?Office: [phone] ? ? ?Kamare Caspers B Enda Santo ?04/27/2021, 1:20 PM ? ?

## 2021-04-27 NOTE — Plan of Care (Signed)

## 2021-04-27 NOTE — Progress Notes (Signed)
?PROGRESS NOTE ? ? ? ?Brendan Campbell  TMH:[id number] DOB: 1970/04/26 DOA: 04/20/2021 ?PCP: Monico Blitz, MD  ?[place] y.o. 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  ?Patient underwent left leg BKA on 02/16/2021.  In February, he started having pain at the BKA site with some foul-smelling discharge. On 3/14, he was seen by vascular surgery in the office, >concern of deep tissue infection, sent to ED ?3/16, patient underwent revision of left AKA from BKA, debridement right AKA. ?Because of intraoperative hypotension, patient required pressors and was briefly admitted to ICU, subsequently BP stabilized and transferred to  [facility] service, on IV antibiotics, IV heparin, D10 gtt and Dilaudid PCA ?-Getting extra HD for volume removal ?-Heparin briefly held for right arm/elbow hematoma ? ? ?Subjective: ?-Feels okay, bothered by scrotal swelling, remains asymptomatic from hypoglycemia, peripheral CBGs for the most part" not correlated with blood glucose ? ?Assessment & Plan: ? ?Left BKA stump infected ulcer ?History of left BKA in January 2023 ?-Presented with wound infection, very poor hygiene, abnormal hair found in wound ?-3/16, underwent left AKA, converted from BKA, debridement of right BKA ?-Vascular surgery following, discontinue PCA pump today ?-WBC starting to improve, antibiotics discontinued yesterday, discussed with vascular surgery, afebrile, leukocytosis improving ?-Underwent bedside debridement of right BKA wound today ?-PT eval, anticipate need for SNF ?  ?Hypotension ?-Developed intraoperative hypotension, likely secondary to sedation, pain meds  ?-Stable off pressors, monitor with HD ?-Continue midodrine with HD ? ?Right forearm hematoma/ecchymosis -3/19 ?-Held heparin for 48 hours ?-Restart Eliquis tomorrow, monitor hemoglobin ?  ?ESRD HD  MWF ?Hyperkalemia ?Volume overload, scrotal edema ?-Nephrology following.   ?-Dialysis today, needs more volume removed as tolerated ?-Now on midodrine ?  ?Chronic combined systolic and diastolic CHF  ?-Volume managed with HD ?  ?Paroxysmal A-fib ?-Continue amiodarone ?-Eliquis, heparin on hold, restart Eliquis tomorrow if stable ?  ?Type 2 diabetes mellitus with hypOglycemia ?-A1c 5.3 on 3/14 ?-Was on D10 gtt. for 48 hours, weaned off yesterday, encourage supplements ?-on further eval, his Bmet and fingersticks do not correlate -remains completely asymptomatic when CBGs are low, check STAT BMET next time CBG drops ?-Encouraged supplements TID between meals ? ?COPD/chronic respiratory failure ?-Has been using 2 L O2 as needed for few years ?-continue pulmonary toilet ?  ?Peripheral artery disease ?s/p prior bilateral BKA ?-PTA, patient was on aspirin, Eliquis, Lipitor.  ?-Eliquis and aspirin on hold.  continue statin ?  ?Anemia secondary to CKD ?-Mild drop in hemoglobin noted, continue to trend ?-EPO with HD ? ?OTHER: d/w Staff to move right femoral central line as long as alternate IV access achieved ?  ?DVT prophylaxis: Heparin now on hold, restart Eliquis tomorrow ?Code Status: Full code ?Family Communication: Discussed with patient in detail, no family at bedside ?Disposition Plan: Anticipate need for SNF ? ?Consultants:  ?Nephrology, vascular surgery ? ?Procedures: Revision of left BKA to AKA, debridement of right BKA ? ?Antimicrobials:  ? ? ?Objective: ?Vitals:  ? 04/27/21 0700 04/27/21 0835 04/27/21 1128 04/27/21 1221  ?BP: 122/89  98/79 98/79  ?Pulse: (!) 101  (!) 106 (!) 106  ?Resp: 12 14 16 16   ?Temp:   98.1 ?F (36.7 ?C) 98.1 ?F (36.7 ?C)  ?TempSrc:   Oral   ?SpO2: 100% 100% 97%   ?Weight:      ?Height:      ? ? ?Intake/Output Summary (Last  24 hours) at 04/27/2021 1340 ?Last data filed at 04/26/2021 2130 ?Gross per 24 hour  ?Intake 340 ml  ?Output 0 ml  ?Net 340 ml  ? ?Filed Weights  ? 04/24/21 0900 04/25/21  1244 04/26/21 0823  ?Weight: 97 kg 95.9 kg 97.8 kg  ? ? ?Examination: ? ?General exam: Chronically ill obese male sitting up in bed, AAOx3, no distress ?HEENT: Positive JVD ?CVS: S1-S2, regular rhythm ?Lungs: Decreased breath sounds at bases ?Abdomen: Soft, obese, nontender, bowel sounds present ?GU: Significant scrotal edema ?Extremities: Right BKA with open wound, left AKA with dressing , extensive bruising/ecchymosis noted in right forearm, elbow-improving ?Right femoral central line ?Skin: Scabs throughout his upper and lower extremities ? ? ?Data Reviewed:  ? ?CBC: ?Recent Labs  ?Lab 04/22/21 ?0129 04/22/21 ?1449 04/23/21 ?0355 04/24/21 ?8366 04/25/21 ?2947 04/26/21 ?6546 04/27/21 ?5035  ?WBC 10.7*   < > 12.6* 16.4* 16.2* 13.1* 12.7*  ?NEUTROABS 8.6*  --  12.0* 14.9*  --   --   --   ?HGB 11.7*   < > 10.4* 10.2* 9.2* 9.0* 8.6*  ?HCT 34.9*   < > 31.0* 30.6* 28.0* 27.5* 25.5*  ?MCV 102.3*   < > 103.7* 104.4* 104.5* 104.6* 103.2*  ?PLT 125*   < > 147* 151 123* 97* 90*  ? < > = values in this interval not displayed.  ? ?Basic Metabolic Panel: ?Recent Labs  ?Lab 04/22/21 ?0129 04/22/21 ?1449 04/22/21 ?1736 04/23/21 ?0355 04/24/21 ?4656 04/25/21 ?0214 04/26/21 ?8127 04/26/21 ?1454 04/27/21 ?5170  ?NA 133*   < > 132*   < > 132* 130* 126* 133* 132*  ?K 3.9   < > 3.8   < > 4.2 3.4* 3.9 3.6 3.7  ?CL 97*  --  97*   < > 98 96* 92* 98 98  ?CO2 22  --  21*   < > 22 26 25 27 26   ?GLUCOSE 67*  --  106*   < > 71 148* 98 96 56*  ?BUN 21*  --  23*   < > 28* 15 19 8 10   ?CREATININE 4.28*  --  4.81*   < > 5.36* 3.54* 3.89* 2.44* 2.97*  ?CALCIUM 7.8*  --  8.4*   < > 8.4* 7.8* 7.6* 7.6* 7.6*  ?PHOS 4.8*  --  5.8*  --   --   --   --   --   --   ? < > = values in this interval not displayed.  ? ?GFR: ?Estimated Creatinine Clearance: 34.9 mL/min (A) (by C-G formula based on SCr of 2.97 mg/dL (H)). ?Liver Function Tests: ?Recent Labs  ?Lab 04/22/21 ?0129 04/22/21 ?1736 04/27/21 ?0174  ?AST  --   --  18  ?ALT  --   --  14  ?ALKPHOS  --    --  76  ?BILITOT  --   --  1.7*  ?PROT  --   --  4.7*  ?ALBUMIN 2.1* 2.2* 1.8*  ? ?No results for input(s): LIPASE, AMYLASE in the last 168 hours. ?No results for input(s): AMMONIA in the last 168 hours. ?Coagulation Profile: ?No results for input(s): INR, PROTIME in the last 168 hours. ?Cardiac Enzymes: ?No results for input(s): CKTOTAL, CKMB, CKMBINDEX, TROPONINI in the last 168 hours. ?BNP (last 3 results) ?No results for input(s): PROBNP in the last 8760 hours. ?HbA1C: ?No results for input(s): HGBA1C in the last 72 hours. ?CBG: ?Recent Labs  ?Lab 04/26/21 ?2340 04/27/21 ?0124 04/27/21 ?0422 04/27/21 ?9449 04/27/21 ?1127  ?  GLUCAP 61* 83 92 54* 61*  ? ?Lipid Profile: ?No results for input(s): CHOL, HDL, LDLCALC, TRIG, CHOLHDL, LDLDIRECT in the last 72 hours. ?Thyroid Function Tests: ?No results for input(s): TSH, T4TOTAL, FREET4, T3FREE, THYROIDAB in the last 72 hours. ?Anemia Panel: ?No results for input(s): VITAMINB12, FOLATE, FERRITIN, TIBC, IRON, RETICCTPCT in the last 72 hours. ?Urine analysis: ?No results found for: COLORURINE, APPEARANCEUR, [place], [place], [place], [place], [place], KETONESUR, PROTEINUR, [place], NITRITE, LEUKOCYTESUR ?Sepsis Labs: ?@[email or web address] (procalcitonin:4,lacticidven:4) ? ?) ?Recent Results (from the past 240 hour(s))  ?Resp Panel by RT-PCR (Flu A&B, Covid) Nasopharyngeal Swab     Status: None  ? Collection Time: 04/20/21 12:54 PM  ? Specimen: Nasopharyngeal Swab; Nasopharyngeal(NP) swabs in vial transport medium  ?Result Value Ref Range Status  ? SARS Coronavirus 2 by RT PCR NEGATIVE NEGATIVE Final  ?  Comment: (NOTE) ?SARS-CoV-2 target nucleic acids are NOT DETECTED. ? ?The SARS-CoV-2 RNA is generally detectable in upper respiratory ?specimens during the acute phase of infection. The lowest ?concentration of SARS-CoV-2 viral copies this assay can detect is ?138 copies/mL. A negative result does not preclude SARS-Cov-2 ?infection and should not be used as the sole basis for  treatment or ?other patient management decisions. A negative result may occur with  ?improper specimen collection/handling, submission of specimen other ?than nasopharyngeal swab, presence of viral mutation(s)

## 2021-04-27 NOTE — Progress Notes (Signed)
?[facility] ?Progress Note  ? ?Subjective:   patient seen and examined this AM. S/p debridement today. Tolerated HD yesterday with net UF 2L, still uncomfortable with scrotal edema ? ?Objective ?Vitals:  ? 04/27/21 0425 04/27/21 0700 04/27/21 0835 04/27/21 1128  ?BP: 112/79 122/89  98/79  ?Pulse: (!) 101 (!) 101  (!) 106  ?Resp: 12 12 14 16   ?Temp: 98.2 ?F (36.8 ?C)   98.1 ?F (36.7 ?C)  ?TempSrc: Axillary   Oral  ?SpO2: 100% 100% 100% 97%  ?Weight:      ?Height:      ? ?Physical Exam ?General appearance: Chronically ill-appearing, lying in bed, no distress ?Resp: Bilateral chest rise with no increased work of breathing ?Cardio: regular rate and rhythm ?GI: soft, non-tender ?Extremities: right bka and left aka, some mild lower extremity edema present bilaterally ?GU: +scrotal edema ?  ? ?Additional Objective ?Labs: ?Basic Metabolic Panel: ?Recent Labs  ?Lab 04/22/21 ?0129 04/22/21 ?1449 04/22/21 ?1736 04/23/21 ?0355 04/26/21 ?0415 04/26/21 ?1454 04/27/21 ?2637  ?NA 133*   < > 132*   < > 126* 133* 132*  ?K 3.9   < > 3.8   < > 3.9 3.6 3.7  ?CL 97*  --  97*   < > 92* 98 98  ?CO2 22  --  21*   < > 25 27 26   ?GLUCOSE 67*  --  106*   < > 98 96 56*  ?BUN 21*  --  23*   < > 19 8 10   ?CREATININE 4.28*  --  4.81*   < > 3.89* 2.44* 2.97*  ?CALCIUM 7.8*  --  8.4*   < > 7.6* 7.6* 7.6*  ?PHOS 4.8*  --  5.8*  --   --   --   --   ? < > = values in this interval not displayed.  ? ?Liver Function Tests: ?Recent Labs  ?Lab 04/20/21 ?1234 04/22/21 ?0129 04/22/21 ?1736 04/27/21 ?8588  ?AST 24  --   --  18  ?ALT 17  --   --  14  ?ALKPHOS 106  --   --  76  ?BILITOT 1.6*  --   --  1.7*  ?PROT 5.6*  --   --  4.7*  ?ALBUMIN 2.1* 2.1* 2.2* 1.8*  ? ?No results for input(s): LIPASE, AMYLASE in the last 168 hours. ?CBC: ?Recent Labs  ?Lab 04/22/21 ?0129 04/22/21 ?1449 04/23/21 ?0355 04/24/21 ?5027 04/25/21 ?7412 04/26/21 ?8786 04/27/21 ?7672  ?WBC 10.7*   < > 12.6* 16.4* 16.2* 13.1* 12.7*  ?NEUTROABS 8.6*  --  12.0* 14.9*  --    --   --   ?HGB 11.7*   < > 10.4* 10.2* 9.2* 9.0* 8.6*  ?HCT 34.9*   < > 31.0* 30.6* 28.0* 27.5* 25.5*  ?MCV 102.3*   < > 103.7* 104.4* 104.5* 104.6* 103.2*  ?PLT 125*   < > 147* 151 123* 97* 90*  ? < > = values in this interval not displayed.  ? ?Blood Culture ?   ?Component Value Date/Time  ? SDES BLOOD RIGHT FOREARM 04/20/2021 1322  ? SPECREQUEST  04/20/2021 1322  ?  BOTTLES DRAWN AEROBIC AND ANAEROBIC Blood Culture adequate volume  ? CULT  04/20/2021 1322  ?  NO GROWTH 5 DAYS ?Performed at [facility], [place] [address] [place], [place] [address] ?  ? REPTSTATUS 04/25/2021 FINAL 04/20/2021 1322  ? ? ?Cardiac Enzymes: ?No results for input(s): CKTOTAL, CKMB, CKMBINDEX, TROPONINI in the last 168  hours. ?CBG: ?Recent Labs  ?Lab 04/26/21 ?2340 04/27/21 ?0124 04/27/21 ?0422 04/27/21 ?5638 04/27/21 ?1127  ?GLUCAP 61* 83 92 54* 61*  ? ?Iron Studies: No results for input(s): IRON, TIBC, TRANSFERRIN, FERRITIN in the last 72 hours. ?@lablastinr3 @ ?Studies/Results: ?No results found. ?Medications: ? promethazine (PHENERGAN) injection (IM or IVPB) Stopped (04/23/21 0325)  ? ? amiodarone  200 mg Oral Daily  ? vitamin C  250 mg Oral BID  ? atorvastatin  10 mg Oral Q M,W,F  ? calcium acetate  667 mg Oral TID WC  ? cholecalciferol  1,000 Units Oral Q M,W,F  ? feeding supplement  237 mL Oral TID BM  ? feeding supplement (NEPRO CARB STEADY)  237 mL Oral TID BM  ? fentaNYL (SUBLIMAZE) injection  25 mcg Intravenous Q4H  ? leptospermum manuka honey  1 application. Topical Daily  ? midodrine  10 mg Oral BID WC  ? multivitamin  1 tablet Oral QHS  ? mupirocin ointment  1 application. Nasal BID  ? pantoprazole  40 mg Oral Daily  ? sodium zirconium cyclosilicate  10 g Oral Once  ? zinc sulfate  220 mg Oral Daily  ? ? ?Dialysis Orders:  [name]  on MWF ? 4h 80min  95 kg   1K/2.25 bath  Hep 1000+ 500u/hr  LUE AVG  300/500   ?- prosthetic weighs 1.6 kg ? - Epogen 1000   Units IV/HD   ?  ?Assessment/Plan ?**stump infection:   broad spectrum antibiotics.  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  Complicated by hypotension but now more stable.  Management per vascular ?  ?**ESRD on HD:  HD per usual schedule MWF - heparin per outpt held due to systemic heparin gtt for periop a fib anticoag.  HD on MWF schedule, with midodrine support. Will plan for an extra treatment (sequential) today ?  ?**Anemia:  Hb in 10-11, follow, transfuse prn for hgb <7  On low dose epogen outpt. Hgb down to 8.6 today. Aranesp 189mcg to start 3/22 ? ?**CKD MBD: c/w phoslo ?  ?**Nutrition: supplements, per primary ? ?**PAF: heparin gtt bridgin eliquis, mgmt per primary ? ?Gean Quint, MD ?[facility] ?

## 2021-04-27 NOTE — Op Note (Signed)
? ? ?NAME: Brendan Campbell    MRN: [id number] ?DOB: 1971/01/22    DATE OF OPERATION: 04/27/2021 ? ?PREOP DIAGNOSIS:   ? ?Nonhealing left-sided below-knee amputation wound ? ?POSTOP DIAGNOSIS:   ? ?Same ? ?PROCEDURE:  ?  ?Left-sided above-knee amputation, right-sided incision and debridement of previous below-knee amputation. ? ? ?SURGEON: Broadus John ? ?ASSIST: Roxy Horseman, PA ? ?ANESTHESIA: General ? ?EBL: 100 mL ? ?INDICATIONS:  ? ? Brendan Campbell is a 51 y.o. male status post 02/16/2021 left-sided below-knee amputation for nonsalvageable lower extremity foot wounds.  Admission at that time was marked by noncompliance, refusal of skilled nursing facility, refusal of home nursing.  He was discharged and seen in clinic with erythema appreciated at the wound site.  He was given antibiotics, which she did not pick up at the pharmacy.  He was then admitted to [facility] and found to have a bedbug infestation.  During this, the left below-knee amputation continue to deteriorate.  He was last seen in clinic with concern for wound dehiscence.  After discussing the risk and benefits of right-sided above-knee amputation for poorly healing below-knee amputation, Brendan Campbell elected to proceed.  At the time of consultation, poor hygiene had also led to superficial breakdown of the right BKA stump.  We discussed local incision and debridement of that area and I promote healthy wound healing.  After discussing the risk and benefits, Brendan Campbell elected to proceed. ? ?FINDINGS:  ? ?Left-sided below-knee amputation stump necrotic. ?Right-sided below-knee amputation stump with superficial necrosis, significant amount of hair in the wounds. ? ?TECHNIQUE:  ? ?Patient was brought to the OR laid in the supine position.  General anesthesia was induced and the patient was prepped and draped in standard fashion.  A timeout was performed. ? ?A fish-mouth incision line was drawn 7cm from the patella for the left above-knee amputation.   Cautery was used for dissection. Thigh muscles were divided and femoral artery and vein identified.  These were oversewn using two, 2-0 silk pop stick ties and buttressed with a large clip.  Once the circumferential exposure of the femur was complete, a reciprocating saw was used to divide the femur. This was followed by amputation knife through the back of the thigh.  Next, a periosteal elevator and cautery were used to expose 5cm more of the proximal femur.  Rakes were used to hold the thigh muscles and the reciprocating saw was once again used to divide the femur.  ? ?The tourniquet was released and bleeding vessels oversewn using 2-0 silk suture. The sciatic nerve was identified, pulled to length and ligated with a zero silk tie. Once hemostasis was achieved, warm saline was brought to the field and the wound bed was irrigated.  I used a 0 Vicryl to close the periosteal tissue around the femur.  There was no bleeding from the marrow.  Next, my attention turned to closure of the fascial layer.  This was done using 2-0 Vicryl pop suture.  The fascial layer was closed along the entirety of the incision.  A stapler was brought to the field to oppose the dermis. An occlusive dressing was placed. ? ?Next, my attention turned to the right BKA stump.  All necrosis appeared superficial, limited to the dermis and subcutaneous adipose layer.  A 15 blade was used to remove the necrosis.  Healthy bleeding was encountered.  Total area of debridement was an irregularly shaped patch measuring 6 x 6 x 0.5 cm.  A  wet-to-dry dressing was placed over the wound bed and the patient was taken the PACU in stable condition. ? ?Given the complexity of the case a first assistant was necessary in order to expedient the procedure and safely perform the technical aspects of the operation.  Specifically, Laurence Slate aided in retraction countertraction, vessel exposure, vessel ligation, transection of the femur, closure. ? ?Macie Burows, MD ?[facility] ? ?DATE OF DICTATION:   04/27/2021 ? ?

## 2021-04-27 NOTE — [place] FL2 (Signed)
?[place] MEDICAID FL2 LEVEL OF CARE SCREENING TOOL  ?  ? ?IDENTIFICATION  ?Patient Name: ?Brendan Campbell Birthdate: 03/09/1970 Sex: male Admission Date (Current Location): ?04/20/2021  ?[place] and [place] Number: ? [place] ?  Facility and Address:  ?[facility], [place] [address] [place], [place] [address] ?     Provider Number: ?[id number]  ?Attending Physician Name and Address:  ?Domenic Polite, MD ? Relative Name and Phone Number:  ?  ?   ?Current Level of Care: ?Hospital Recommended Level of Care: ?[place] Prior Approval Number: ?  ? ?Date Approved/Denied: ?  PASRR Number: ?[id number] K ? ?Discharge Plan: ?SNF ?  ? ?Current Diagnoses: ?Patient Active Problem List  ? Diagnosis Date Noted  ? Osteomyelitis of ankle or foot, acute, left ([place]) 04/20/2021  ? Osteomyelitis ([place]) 04/20/2021  ? Nausea and vomiting   ? Intractable nausea and vomiting 03/10/2021  ? Noncompliance 03/10/2021  ? MRSA bacteremia 02/13/2021  ? Rash 02/13/2021  ? Lower extremity cellulitis 02/04/2021  ? Right endophthalmia 11/12/2020  ? Chronic systolic CHF (congestive heart failure) ([place]) 07/27/2020  ? Volume overload 07/20/2020  ? COVID-19 virus infection 07/20/2020  ? Acute on chronic respiratory failure with hypoxia ([place]) 07/03/2020  ? Acute on chronic combined systolic and diastolic CHF (congestive heart failure) ([place]) 04/08/2020  ? Left hemiparesis ([place]) 04/08/2020  ? Lumbar spondylosis 04/08/2020  ? Atrial fibrillation with RVR ([place]) 04/08/2020  ? Pain in limb 12/29/2019  ? Cervical radiculopathy 12/29/2019  ? CHF (congestive heart failure) ([place]) 12/13/2019  ? Paresthesia 09/24/2019  ? Hypocalcemia   ? Chronic atrial fibrillation (HCC)   ? Hyperparathyroidism ([place])   ? Cardiac arrest ([place]) 06/07/2019  ? Neuropathy   ? Peripheral vascular disease ([place])   ? ESRD on hemodialysis ([place])   ? Hyperkalemia 10/05/2018  ? Spondylosis of lumbar spine 03/08/2016  ? Lumbar radiculopathy 03/08/2016  ? S/P  bilateral BKA (below knee amputation) ([place]) 03/05/2015  ? Type 2 diabetes mellitus with diabetic chronic kidney disease ([place]) 03/03/2015  ? Hyperlipidemia 03/03/2015  ? Low back pain 03/03/2015  ? ? ?Orientation RESPIRATION BLADDER Height & Weight   ?  ?Self, Time, Situation, Place ? O2 ([place] 2L) Continent Weight: 210 lb 12.2 oz (95.6 kg) ?Height:  5\' 10"  (177.8 cm) (pre-amputation height)  ?BEHAVIORAL SYMPTOMS/MOOD NEUROLOGICAL BOWEL NUTRITION STATUS  ?    Continent Diet (See DC summary)  ?AMBULATORY STATUS COMMUNICATION OF NEEDS Skin   ?Extensive Assist Verbally Surgical wounds (L AKA, R BKA) ?  ?  ?  ?    ?     ?     ? ? ?Personal Care Assistance Level of Assistance  ?Bathing, Feeding, Dressing Bathing Assistance: Maximum assistance ?Feeding assistance: Limited assistance ?Dressing Assistance: Maximum assistance ?   ? ?Functional Limitations Info  ?Sight, Hearing, Speech Sight Info: Impaired ?Hearing Info: Adequate ?Speech Info: Adequate  ? ? ?SPECIAL CARE FACTORS FREQUENCY  ?PT (By licensed PT), OT (By licensed OT)   ?  ?PT Frequency: 5x week ?OT Frequency: 5x week ?  ?  ?  ?   ? ? ?Contractures Contractures Info: Not present  ? ? ?Additional Factors Info  ?Code Status, Allergies Code Status Info: Full ?Allergies Info: Tape,   Chlorhexidine Gluconate (Chlorhexidine ?  ?  ?  ?   ? ?Current Medications (04/27/2021):  This is the current hospital active medication list ?Current Facility-Administered Medications  ?Medication Dose Route Frequency Provider Last Rate Last Admin  ? acetaminophen (  TYLENOL) tablet 650 mg  650 mg Oral Q6H PRN Laurence Slate M, PA-C      ? albumin human 25 % solution           ? albuterol (PROVENTIL) (2.5 MG/3ML) 0.083% nebulizer solution 2.5 mg  2.5 mg Inhalation Q4H PRN Laurence Slate M, PA-C      ? amiodarone (PACERONE) tablet 200 mg  200 mg Oral Daily Laurence Slate M, PA-C   200 mg at 04/27/21 4166  ? [START ON 04/28/2021] apixaban (ELIQUIS) tablet 5 mg  5 mg Oral BID Domenic Polite, MD       ? ascorbic acid (VITAMIN C) tablet 250 mg  250 mg Oral BID Terrilee Croak, MD   250 mg at 04/27/21 0630  ? atorvastatin (LIPITOR) tablet 10 mg  10 mg Oral Q M,W,F Laurence Slate M, PA-C   10 mg at 04/26/21 1353  ? bisacodyl (DULCOLAX) EC tablet 5 mg  5 mg Oral Daily PRN Ulyses Amor, PA-C      ? butalbital-acetaminophen-caffeine (FIORICET) [phone] MG per tablet 1 tablet  1 tablet Oral Q6H PRN Laurence Slate M, PA-C      ? calcium acetate (PHOSLO) capsule 667 mg  667 mg Oral TID WC Ulyses Amor, PA-C   667 mg at 04/27/21 2355  ? cholecalciferol (VITAMIN D3) tablet 1,000 Units  1,000 Units Oral Q M,W,F Ulyses Amor, PA-C   1,000 Units at 04/26/21 1353  ? [START ON 04/28/2021] Darbepoetin Alfa (ARANESP) injection 100 mcg  100 mcg Intravenous Q Wed-HD Gean Quint, MD      ? diphenhydrAMINE (BENADRYL) capsule 25 mg  25 mg Oral Q6H PRN Ulyses Amor, PA-C   25 mg at 04/24/21 2326  ? feeding supplement (ENSURE ENLIVE / ENSURE PLUS) liquid 237 mL  237 mL Oral TID BM Dahal, Binaya, MD      ? feeding supplement (NEPRO CARB STEADY) liquid 237 mL  237 mL Oral TID BM Domenic Polite, MD      ? fentaNYL (SUBLIMAZE) injection 25 mcg  25 mcg Intravenous Q4H Domenic Polite, MD   25 mcg at 04/27/21 1046  ? hydrALAZINE (APRESOLINE) tablet 25 mg  25 mg Oral Q6H PRN Laurence Slate M, PA-C      ? leptospermum manuka honey (MEDIHONEY) paste 1 application.  1 application. Topical Daily Dahal, Marlowe Aschoff, MD   1 application. at 04/27/21 0916  ? midodrine (PROAMATINE) tablet 10 mg  10 mg Oral BID WC Domenic Polite, MD   10 mg at 04/27/21 7322  ? multivitamin (RENA-VIT) tablet 1 tablet  1 tablet Oral QHS Terrilee Croak, MD   1 tablet at 04/26/21 2349  ? mupirocin ointment (BACTROBAN) 2 % 1 application.  1 application. Nasal BID Ulyses Amor, PA-C   1 application. at 04/27/21 0843  ? oxyCODONE (Oxy IR/ROXICODONE) immediate release tablet 5-10 mg  5-10 mg Oral Q6H PRN Domenic Polite, MD   10 mg at 04/27/21 1236  ? pantoprazole  (PROTONIX) EC tablet 40 mg  40 mg Oral Daily Laurence Slate M, PA-C   40 mg at 04/27/21 0254  ? promethazine (PHENERGAN) 25 mg in sodium chloride 0.9 % 50 mL IVPB  25 mg Intravenous Q8H PRN Ulyses Amor, PA-C   Stopped at 04/23/21 2706  ? senna-docusate (Senokot-S) tablet 1 tablet  1 tablet Oral QHS PRN Laurence Slate M, PA-C      ? sodium zirconium cyclosilicate (LOKELMA) packet 10 g  10 g Oral Once Laurence Slate  M, PA-C      ? tiZANidine (ZANAFLEX) tablet 4 mg  4 mg Oral Daily PRN Laurence Slate M, PA-C   4 mg at 04/20/21 2220  ? zinc sulfate capsule 220 mg  220 mg Oral Daily Dahal, Marlowe Aschoff, MD   220 mg at 04/27/21 7588  ? ? ? ?Discharge Medications: ?Please see discharge summary for a list of discharge medications. ? ?Relevant Imaging Results: ? ?Relevant Lab Results: ? ? ?Additional Information ?SS# [phone] , out-pt HD at [facility] on MWF. Pt has an 11:30 chair time and uses [employer] for transportation services ? ?Coralee Pesa, LCSWA ? ? ? ? ?

## 2021-04-27 NOTE — Progress Notes (Signed)
?  Progress Note ? ? ? ?04/27/2021 ?6:55 AM ?5 Days Post-Op ? ?Subjective:  c/o pain with removal of dressing ? ?afebrile ?Vitals:  ? 04/27/21 0342 04/27/21 0425  ?BP:  112/79  ?Pulse: 97 (!) 101  ?Resp: 10 12  ?Temp:  98.2 ?F (36.8 ?C)  ?SpO2: 100% 100%  ? ? ?Physical Exam: ?Incisions:   ?Left AKA: ? ? ?Right BKA: ? ? ? ?CBC ?   ?Component Value Date/Time  ? WBC 12.7 (H) 04/27/2021 0529  ? RBC 2.47 (L) 04/27/2021 0529  ? HGB 8.6 (L) 04/27/2021 0529  ? HGB 10.4 (L) 06/02/2016 1107  ? HCT 25.5 (L) 04/27/2021 0529  ? HCT 32.9 (L) 06/02/2016 1107  ? PLT 90 (L) 04/27/2021 0529  ? PLT 389 (H) 06/02/2016 1107  ? MCV 103.2 (H) 04/27/2021 0529  ? MCV 94 06/02/2016 1107  ? MCH 34.8 (H) 04/27/2021 0529  ? MCHC 33.7 04/27/2021 0529  ? RDW 18.5 (H) 04/27/2021 0529  ? RDW 16.0 (H) 06/02/2016 1107  ? LYMPHSABS 0.8 04/24/2021 0420  ? LYMPHSABS 2.9 06/02/2016 1107  ? MONOABS 0.6 04/24/2021 0420  ? EOSABS 0.0 04/24/2021 0420  ? EOSABS 0.5 (H) 06/02/2016 1107  ? BASOSABS 0.0 04/24/2021 0420  ? BASOSABS 0.1 06/02/2016 1107  ? ? ?BMET ?   ?Component Value Date/Time  ? NA 132 (L) 04/27/2021 0529  ? NA 142 12/16/2015 1051  ? K 3.7 04/27/2021 0529  ? CL 98 04/27/2021 0529  ? CO2 26 04/27/2021 0529  ? GLUCOSE 56 (L) 04/27/2021 0529  ? BUN 10 04/27/2021 0529  ? BUN 45 (H) 12/16/2015 1051  ? CREATININE 2.97 (H) 04/27/2021 0529  ? CALCIUM 7.6 (L) 04/27/2021 0529  ? GFRNONAA 25 (L) 04/27/2021 0529  ? GFRAA 9 (L) 09/27/2019 0951  ? ? ?INR ?   ?Component Value Date/Time  ? INR 1.4 (H) 02/13/2021 0304  ? ? ? ?Intake/Output Summary (Last 24 hours) at 04/27/2021 0655 ?Last data filed at 04/26/2021 2130 ?Gross per 24 hour  ?Intake 340 ml  ?Output 2000 ml  ?Net -1660 ml  ? ? ? ?Assessment/Plan:  51 y.o. male is s/p left above knee amputation and debridement of right BKA ? 5 Days Post-Op ? ?-dressing removed from left stump and it is viable.  Replaced dressing.  Will need to be protected from being soiled.  ?-continue wet to dry dressings on right  stump. ?-appt to be arranged for follow up to have sutures removed ? ? ?Leontine Locket, PA-C ?[facility] ?[phone] ?04/27/2021 ?6:55 AM ? ? ? ? ? ? ? ?  ? ?

## 2021-04-27 NOTE — Progress Notes (Signed)
Hypoglycemic Event ? ?CBG: 61 ? ?Treatment: D50 25 mL (12.5 gm) ? ?Symptoms: None ? ?Follow-up CBG: Time: 0124 CBG Result: 83 ? ?Possible Reasons for Event: Inadequate meal intake ? ?Comments/MD notified: Pt only taking a few sips of juice ? ? ? ?Brendan Campbell ? ? ?

## 2021-04-27 NOTE — Progress Notes (Incomplete)
Wasted hydromorphone from PCA pump.  65mL left in syring. ? ? ?

## 2021-04-27 NOTE — Progress Notes (Signed)
Hypoglycemic Event ? ?CBG: 53 ? ?Treatment: D50 25 mL (12.5 gm) ? ?Symptoms: None ? ?Follow-up CBG: Time: 2204 CBG Result: 77 ? ?Possible Reasons for Event: Inadequate meal intake ? ?Comments/MD notified: Pt refusing po at this time ? ? ? ?Jodell Cipro ? ? ?

## 2021-04-27 NOTE — Progress Notes (Signed)
PCA pump was discontinued. Wasted 18 mL hydromorphone left in syringe. ? ?Idolina Primer, RN ?

## 2021-04-28 DIAGNOSIS — I5042 Chronic combined systolic (congestive) and diastolic (congestive) heart failure: Secondary | ICD-10-CM | POA: Diagnosis not present

## 2021-04-28 DIAGNOSIS — I739 Peripheral vascular disease, unspecified: Secondary | ICD-10-CM | POA: Diagnosis present

## 2021-04-28 DIAGNOSIS — Z992 Dependence on renal dialysis: Secondary | ICD-10-CM

## 2021-04-28 DIAGNOSIS — I10 Essential (primary) hypertension: Secondary | ICD-10-CM

## 2021-04-28 DIAGNOSIS — T8740 Infection of amputation stump, unspecified extremity: Secondary | ICD-10-CM | POA: Diagnosis not present

## 2021-04-28 DIAGNOSIS — J441 Chronic obstructive pulmonary disease with (acute) exacerbation: Secondary | ICD-10-CM | POA: Diagnosis present

## 2021-04-28 DIAGNOSIS — N186 End stage renal disease: Secondary | ICD-10-CM | POA: Diagnosis not present

## 2021-04-28 DIAGNOSIS — E119 Type 2 diabetes mellitus without complications: Secondary | ICD-10-CM

## 2021-04-28 DIAGNOSIS — I480 Paroxysmal atrial fibrillation: Secondary | ICD-10-CM | POA: Diagnosis present

## 2021-04-28 DIAGNOSIS — E1169 Type 2 diabetes mellitus with other specified complication: Secondary | ICD-10-CM | POA: Diagnosis present

## 2021-04-28 LAB — GLUCOSE, CAPILLARY
Glucose-Capillary: 59 mg/dL — ABNORMAL LOW (ref 70–99)
Glucose-Capillary: 59 mg/dL — ABNORMAL LOW (ref 70–99)
Glucose-Capillary: 62 mg/dL — ABNORMAL LOW (ref 70–99)
Glucose-Capillary: 62 mg/dL — ABNORMAL LOW (ref 70–99)
Glucose-Capillary: 66 mg/dL — ABNORMAL LOW (ref 70–99)
Glucose-Capillary: 73 mg/dL (ref 70–99)
Glucose-Capillary: 75 mg/dL (ref 70–99)
Glucose-Capillary: 77 mg/dL (ref 70–99)

## 2021-04-28 LAB — CBC
HCT: 28.8 % — ABNORMAL LOW (ref 39.0–52.0)
Hemoglobin: 9.7 g/dL — ABNORMAL LOW (ref 13.0–17.0)
MCH: 34.6 pg — ABNORMAL HIGH (ref 26.0–34.0)
MCHC: 33.7 g/dL (ref 30.0–36.0)
MCV: 102.9 fL — ABNORMAL HIGH (ref 80.0–100.0)
Platelets: 80 10*3/uL — ABNORMAL LOW (ref 150–400)
RBC: 2.8 MIL/uL — ABNORMAL LOW (ref 4.22–5.81)
RDW: 18.7 % — ABNORMAL HIGH (ref 11.5–15.5)
WBC: 13.6 10*3/uL — ABNORMAL HIGH (ref 4.0–10.5)
nRBC: 0.1 % (ref 0.0–0.2)

## 2021-04-28 LAB — BASIC METABOLIC PANEL
Anion gap: 12 (ref 5–15)
BUN: 15 mg/dL (ref 6–20)
CO2: 22 mmol/L (ref 22–32)
Calcium: 8.5 mg/dL — ABNORMAL LOW (ref 8.9–10.3)
Chloride: 97 mmol/L — ABNORMAL LOW (ref 98–111)
Creatinine, Ser: 3.69 mg/dL — ABNORMAL HIGH (ref 0.61–1.24)
GFR, Estimated: 19 mL/min — ABNORMAL LOW (ref >60)
Glucose, Bld: 55 mg/dL — ABNORMAL LOW (ref 70–99)
Potassium: 4.5 mmol/L (ref 3.5–5.1)
Sodium: 131 mmol/L — ABNORMAL LOW (ref 135–145)

## 2021-04-28 MED ORDER — ALBUMIN HUMAN 25 % IV SOLN
INTRAVENOUS | Status: AC
  Administered 2021-04-28: 25 g via INTRAVENOUS
  Filled 2021-04-28: qty 100

## 2021-04-28 MED ORDER — FENTANYL CITRATE PF 50 MCG/ML IJ SOSY
25.0000 ug | PREFILLED_SYRINGE | INTRAMUSCULAR | Status: DC
  Administered 2021-04-28 – 2021-04-30 (×9): 25 ug via INTRAVENOUS
  Filled 2021-04-28 (×11): qty 1

## 2021-04-28 MED ORDER — GLUCOSE 40 % PO GEL
1.0000 | ORAL | Status: AC
  Filled 2021-04-28: qty 1

## 2021-04-28 MED ORDER — DEXTROSE 50 % IV SOLN
1.0000 | INTRAVENOUS | Status: DC | PRN
  Administered 2021-05-01: 50 mL via INTRAVENOUS
  Filled 2021-04-28 (×2): qty 50

## 2021-04-28 NOTE — Assessment & Plan Note (Addendum)
No signs of exacerbation  ?Continue with bronchodilator therapy.  ?

## 2021-04-28 NOTE — Procedures (Signed)
Unable to remove ordered UF due to continuous hypotension with pressors between low 50'N systolic to mid 39'J systolic. Net removed was 818 mls.  Pre BP 103/81 Post BP 101/77.  Fistula had great thrill and bruit present, easily accessed and de-accessed.  Cannulation sites held for 10 min with sterile 2x2 and surgifoam placed on cannulation sites taped in place using plastic tape. Pt did not have any concerns nor complaints. Pt very sleepy throughout his treatment, however he is on q 4 hour fentanyl which may account for his sleepiness and hypotension.  His nephrologist Candiss Norse MD is aware of pt condition.   ?

## 2021-04-28 NOTE — Hospital Course (Addendum)
Mr. Capell was admitted to the hospital with the working diagnosis of left BKA infected wound.  ? ?51 y.o. 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  ?Patient underwent left leg BKA on 02/16/2021.  In February, he started having pain at the BKA site with some foul-smelling discharge. On 3/14, he was seen by vascular surgery in the office, >concern of deep tissue infection, sent to ED ?On his initial physical examination his blood pressure was 105/83, HR 71, RR 11 and 02 saturation 100% with temp 95.9  ?Lungs clear to auscultation, heart with S1 and S2 present and rhythmic, abdomen soft, positive lower extremity edema, left BKA with ulcerated wound with full smell and thin discharge.  ? ?Na 134, K 5,6 CL 100, bicarbonate 21 glucose 130, bun 33 cr 6,33 ?Wbc 9,7 hgb 11,6 hct 36.4 plt 165  ?Sars covid 19 negative  ? ?Chest radiograph with cardiomegaly with no infiltrates.  ? ?EKG 69 bpm, right axis deviation, interventricular conduction delay, qtc 550, 1st degree AV block, sinus rhythm with q wave in lead II, III, AvF, V1 to V6, J point elevation V2 and V3 no significant T wave changes.  ? ?Patient was placed on broad spectrum antibiotic therapy.  ? ?3/16, patient underwent revision of left AKA from BKA, debridement right AKA. ?Because of intraoperative hypotension, patient required pressors and was briefly admitted to ICU, subsequently BP stabilized and transferred to  [facility] service, on IV antibiotics, IV heparin, D10 gtt and Dilaudid PCA ?-Getting extra HD for volume removal ?-Heparin briefly held for right arm/elbow hematoma.  ? ?Patient now off PCA pump with good toleration ?Completed antibiotic therapy with good toleration.  ?Wean off D10 IV. ? ?Patient will be transferred to SNF for further physical therapy.  ?  ?

## 2021-04-28 NOTE — Assessment & Plan Note (Addendum)
Continue with aspirin and statin therapy ? ?

## 2021-04-28 NOTE — Assessment & Plan Note (Addendum)
Hyperkalemia. Hyponatremia.  ? ?Patient with hypervolemia, ultrafiltration with hemodialysis.  ?His discharge hgb is 8,9 and hct 26.9  ?Metabolic bone disease, continue with phoslo  ?Anemia of chronic renal disease continue with EPO.  ? ?Arrangements being made to continue renal replacement therapy as outpatient.  ? ? ?

## 2021-04-28 NOTE — Progress Notes (Signed)
?  Progress Note ? ? ?Patient: Brendan Campbell KCM:[id number] DOB: 04/14/1970 DOA: 04/20/2021     8 ?DOS: the patient was seen and examined on 04/28/2021 ?  ?Brief hospital course: ?50 y.o. 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  ?Patient underwent left leg BKA on 02/16/2021.  In February, he started having pain at the BKA site with some foul-smelling discharge. On 3/14, he was seen by vascular surgery in the office, >concern of deep tissue infection, sent to ED ?3/16, patient underwent revision of left AKA from BKA, debridement right AKA. ?Because of intraoperative hypotension, patient required pressors and was briefly admitted to ICU, subsequently BP stabilized and transferred to  [facility] service, on IV antibiotics, IV heparin, D10 gtt and Dilaudid PCA ?-Getting extra HD for volume removal ?-Heparin briefly held for right arm/elbow hematoma ?  ? ?Assessment and Plan: ?* Amputation stump infection ([place]) ?Patient sp 3.16 AKA and converted to BKA with debridement on the right. ?Continue pain control  ?He has completed antibiotic therapy.  ?Continue to follow vascular surgery recommendations, ?PT and OT  ? ?CHF (congestive heart failure) ([place]) ?No clinical signs of decompensation.  ?Continue close blood pressure monitoring for hypotension. ?Patient is off vasopressors.  ?He has a large region of ecchymosis on his right upper extremity.   ? ?COPD with acute exacerbation ([place]) ?No signs of exacerbation  ? ?PVD (peripheral vascular disease) ([place]) ?Continue with aspirin and statin therapy ?To resume anticoagulation when safe per surgery  ? ?Paroxysmal atrial fibrillation (HCC) ?Continue rate control with amiodarone ?Anticoagulation has been on hold, to be resume when stable from vascular perspective.  ? ?Essential hypertension ?Continue blood pressure monitoring  ? ?Type  2 diabetes mellitus ([place]) ?Patient had d10 for hypoglycemia,  ?Now glucose is more stable, continue capillary glucose monitoring  ? ?ESRD on dialysis [facility]) ?Today for HD with good toleration, ?Continue close blood pressure monitoring  ?Anemia of chronic renal disease with hgb stable  ? ? ? ? ?  ? ?Subjective: patient is feeling better, pain is controlled with analgesics  ? ?Physical Exam: ?Vitals:  ? 04/28/21 1700 04/28/21 1730 04/28/21 1747 04/28/21 1755  ?BP: 105/65 (!) 94/52 110/77 101/77  ?Pulse:   79 80  ?Resp:  20 20 17   ?Temp:   (!) 97.5 ?F (36.4 ?C) (!) 97.5 ?F (36.4 ?C)  ?TempSrc:      ?SpO2:    97%  ?Weight:    92.1 kg  ?Height:      ? ?Neurology awake and alert ?ENT with mild pallor ?Cardiovascular with S1 and S2 present with no gallops or rubs ?Respiratory with no rales  ?Abdomen soft  ?Bilateral AKA ulcerated wound on the right ?Data Reviewed: ? ? ? ?Family Communication: no family at the bedside  ? ?Disposition: ?Status is: Inpatient ?Remains inpatient appropriate because: post operative care  ? Planned Discharge Destination: Skilled nursing facility ? ? ? ? ? ?Author: ?Tawni Millers, MD ?04/28/2021 6:06 PM ? ?For on call review [email or web address].  ?

## 2021-04-28 NOTE — Assessment & Plan Note (Addendum)
Patient will continue with amiodarone and anticoagulation with apixaba.  ?Patient has multiple skin ecchymosis, that have been stable. ?For now plan to continue with anticoagulation.  ? ?

## 2021-04-28 NOTE — Assessment & Plan Note (Addendum)
Patient was placed on antibiotic therapy with good toleration. ?His blood cultures remained with no growth.  ? ?Vascular surgery was consulted ?03/21 underwent left side above the knee amputation, and right sided incision and debridement of previous below the knee amputation.  ? ?Patient was placed on PCA pump for pain control, posteriorly transitioned to oral analgesics.  ?Continue local wound care.  ?Follow up with vascular as outpatient.  ?

## 2021-04-28 NOTE — Assessment & Plan Note (Addendum)
No clinical signs of decompensation.  ?Continue close blood pressure monitoring for hypotension. ? ?Patient had hypotension post amputation and debridement, required vasopressors.  ? ?Patient underwent ultrafiltration per nephrology recommendations.  ?Continue outpatient renal replacement therapy for volume control.  ? ? ?  ?

## 2021-04-28 NOTE — TOC Progression Note (Signed)
Transition of Care (TOC) - Progression Note  ? ? ?Patient Details  ?Name: Brendan Campbell ?MRN: [id number] ?Date of Birth: 15-Nov-1970 ? ?Transition of Care (TOC) CM/SW Contact  ?Coralee Pesa, LCSWA ?Phone Number: ?04/28/2021, 4:02 PM ? ?Clinical Narrative:    ?CSW has attempted several times to speak with pt about SNF. Pt has been asleep and did not arouse or acknowledge CSW's prescence. Pt is now in HD, TOC will continue to attempt to assess pt for placement. ? ? ?  ?  ? ?Expected Discharge Plan and Services ?  ?  ?  ?  ?  ?                ?  ?  ?  ?  ?  ?  ?  ?  ?  ?  ? ? ?Social Determinants of Health (SDOH) Interventions ?  ? ?Readmission Risk Interventions ? ?  07/21/2020  ? 11:11 AM  ?Readmission Risk Prevention Plan  ?Transportation Screening Complete  ?PCP or Specialist Appt within 3-5 Days Complete  ?[place] or Home Care Consult Complete  ?Social Work Consult for [place] Planning/Counseling Complete  ?Palliative Care Screening Not Applicable  ?Medication Review Press photographer) Complete  ? ? ?

## 2021-04-28 NOTE — Assessment & Plan Note (Addendum)
Patient had d10 for hypoglycemia. ? ?His glucose has been in the 60 to 90 range, all insulin therapy has been discontinued.  ? ?Continue glucose monitoring ? ?Continue with statin therapy.  ?

## 2021-04-28 NOTE — Progress Notes (Signed)
OT Cancellation Note ? ?Patient Details ?Name: Brendan Campbell ?MRN: [id number] ?DOB: 12-07-1970 ? ? ?Cancelled Treatment:    Reason Eval/Treat Not Completed: Patient at procedure or test/ unavailable (pt in HD) ? ?Lynnda Child, OTD, OTR/L ?Acute Rehab ?[phone] ? ?[name] ?04/28/2021, 3:33 PM ?

## 2021-04-28 NOTE — Progress Notes (Signed)
Patient has been having recurrent hypoglycemic episodes, this morning was 57, treated with 2 cups of OJ.  BG went up to 62.  Patient has a poor appetite.  Encouraged to drink nutritional supplements but refused.  Monitoring patient closely.  Dr. Cathlean Sauer made aware. ?

## 2021-04-28 NOTE — Progress Notes (Signed)
All scheduled meds scanned, offered to patient and patient refused to take pills.  This RN asked the patient if he is refusing his pain medicine as well.  Patient was cursing and states he will take his meds and pain medicine.  All med packets thrown away already, hence unable to rescan meds when changing the status from refused back to given.  Patient tolerated medications well. ?

## 2021-04-28 NOTE — Progress Notes (Signed)
?[facility] ?Progress Note  ? ?Subjective:   patient seen and examined this AM. Quite sleepy/lethargic during my encounter, is nodding off during our conversation-was hypoglycemic earlier. Nodding off. Felt sick on HD yesterday, taking off relatively early, net uf ~1.1L ? ?Objective ?Vitals:  ? 04/28/21 0421 04/28/21 0432 04/28/21 0755 04/28/21 1127  ?BP: 115/83  115/72 105/69  ?Pulse: 100  (!) 101 89  ?Resp: 18 10 16 15   ?Temp: 98.3 ?F (36.8 ?C)  97.6 ?F (36.4 ?C) 97.6 ?F (36.4 ?C)  ?TempSrc: Oral  Axillary Axillary  ?SpO2: 97%     ?Weight:      ?Height:      ? ?Physical Exam ?General appearance: Chronically ill-appearing, lying in bed, lethargic ?Resp: Bilateral chest rise with no increased work of breathing ?Cardio: regular rate and rhythm ?GI: soft, non-tender ?Extremities: right bka and left aka, some mild lower extremity edema present bilaterally ?Neuro: lethargic but responds to vocal and tactile stimuli ?GU: +scrotal edema ?  ? ?Additional Objective ?Labs: ?Basic Metabolic Panel: ?Recent Labs  ?Lab 04/22/21 ?0129 04/22/21 ?1449 04/22/21 ?1736 04/23/21 ?0355 04/26/21 ?1454 04/27/21 ?5830 04/28/21 ?9407  ?NA 133*   < > 132*   < > 133* 132* 131*  ?K 3.9   < > 3.8   < > 3.6 3.7 4.5  ?CL 97*  --  97*   < > 98 98 97*  ?CO2 22  --  21*   < > 27 26 22   ?GLUCOSE 67*  --  106*   < > 96 56* 55*  ?BUN 21*  --  23*   < > 8 10 15   ?CREATININE 4.28*  --  4.81*   < > 2.44* 2.97* 3.69*  ?CALCIUM 7.8*  --  8.4*   < > 7.6* 7.6* 8.5*  ?PHOS 4.8*  --  5.8*  --   --   --   --   ? < > = values in this interval not displayed.  ? ?Liver Function Tests: ?Recent Labs  ?Lab 04/22/21 ?0129 04/22/21 ?1736 04/27/21 ?6808  ?AST  --   --  18  ?ALT  --   --  14  ?ALKPHOS  --   --  76  ?BILITOT  --   --  1.7*  ?PROT  --   --  4.7*  ?ALBUMIN 2.1* 2.2* 1.8*  ? ?No results for input(s): LIPASE, AMYLASE in the last 168 hours. ?CBC: ?Recent Labs  ?Lab 04/22/21 ?0129 04/22/21 ?1449 04/23/21 ?0355 04/24/21 ?8110 04/25/21 ?3159  04/26/21 ?4585 04/27/21 ?9292 04/28/21 ?4462  ?WBC 10.7*   < > 12.6* 16.4* 16.2* 13.1* 12.7* 13.6*  ?NEUTROABS 8.6*  --  12.0* 14.9*  --   --   --   --   ?HGB 11.7*   < > 10.4* 10.2* 9.2* 9.0* 8.6* 9.7*  ?HCT 34.9*   < > 31.0* 30.6* 28.0* 27.5* 25.5* 28.8*  ?MCV 102.3*   < > 103.7* 104.4* 104.5* 104.6* 103.2* 102.9*  ?PLT 125*   < > 147* 151 123* 97* 90* 80*  ? < > = values in this interval not displayed.  ? ?Blood Culture ?   ?Component Value Date/Time  ? SDES BLOOD RIGHT FOREARM 04/20/2021 1322  ? SPECREQUEST  04/20/2021 1322  ?  BOTTLES DRAWN AEROBIC AND ANAEROBIC Blood Culture adequate volume  ? CULT  04/20/2021 1322  ?  NO GROWTH 5 DAYS ?Performed at [facility], [place] [address], [place], [place] [address] ?  ?  REPTSTATUS 04/25/2021 FINAL 04/20/2021 1322  ? ? ?Cardiac Enzymes: ?No results for input(s): CKTOTAL, CKMB, CKMBINDEX, TROPONINI in the last 168 hours. ?CBG: ?Recent Labs  ?Lab 04/28/21 ?0009 04/28/21 ?9562 04/28/21 ?0753 04/28/21 ?1005 04/28/21 ?1125  ?GLUCAP 73 77 59* 62* 66*  ? ?Iron Studies: No results for input(s): IRON, TIBC, TRANSFERRIN, FERRITIN in the last 72 hours. ?@lablastinr3 @ ?Studies/Results: ?No results found. ?Medications: ? promethazine (PHENERGAN) injection (IM or IVPB) Stopped (04/23/21 0325)  ? ? amiodarone  200 mg Oral Daily  ? apixaban  5 mg Oral BID  ? vitamin C  250 mg Oral BID  ? atorvastatin  10 mg Oral Q M,W,F  ? calcium acetate  667 mg Oral TID WC  ? cholecalciferol  1,000 Units Oral Q M,W,F  ? darbepoetin (ARANESP) injection - DIALYSIS  100 mcg Intravenous Q Wed-HD  ? feeding supplement  237 mL Oral TID BM  ? feeding supplement (NEPRO CARB STEADY)  237 mL Oral TID BM  ? fentaNYL (SUBLIMAZE) injection  25 mcg Intravenous Q4H  ? leptospermum manuka honey  1 application. Topical Daily  ? midodrine  10 mg Oral BID WC  ? multivitamin  1 tablet Oral QHS  ? pantoprazole  40 mg Oral Daily  ? sodium zirconium cyclosilicate  10 g Oral Once  ? zinc sulfate  220 mg Oral Daily   ? ? ?Dialysis Orders:  [name]  on MWF ? 4h 32min  95 kg   1K/2.25 bath  Hep 1000+ 500u/hr  LUE AVG  300/500   ?- prosthetic weighs 1.6 kg ? - Epogen 1000   Units IV/HD   ?  ?Assessment/Plan ?**stump infection:  broad spectrum antibiotics.  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  Complicated by hypotension but now more stable.  Management per vascular ?  ?**ESRD on HD:  HD per usual schedule MWF - heparin per outpt held due to systemic heparin gtt for periop a fib anticoag.  HD on MWF schedule, with midodrine support. ?  ?**Anemia:  follow, transfuse prn for hgb <7  On low dose epogen outpt. Aranesp 155mcg to start 3/22 ? ?**CKD MBD: c/w phoslo ?  ?**Nutrition: supplements, per primary ? ?**PAF: on eliquis, mgmt per primary ? ?Gean Quint, MD ?[facility] ?

## 2021-04-28 NOTE — Assessment & Plan Note (Addendum)
Continue blood pressure monitoring  ?Continue with 10 mg midodrine bid.  ?

## 2021-04-28 NOTE — Progress Notes (Addendum)
Patient received back from HD, BG = 62, hypoglycemia protocol ordered.  Pt refused treatment.  Pt is alert and oriented x 4, cranberry juice given, states that he will be eating graham crackers and getting his dinner tray soon. ?

## 2021-04-29 DIAGNOSIS — N186 End stage renal disease: Secondary | ICD-10-CM | POA: Diagnosis not present

## 2021-04-29 DIAGNOSIS — I5042 Chronic combined systolic (congestive) and diastolic (congestive) heart failure: Secondary | ICD-10-CM | POA: Diagnosis not present

## 2021-04-29 DIAGNOSIS — E440 Moderate protein-calorie malnutrition: Secondary | ICD-10-CM | POA: Diagnosis present

## 2021-04-29 DIAGNOSIS — T8740 Infection of amputation stump, unspecified extremity: Secondary | ICD-10-CM | POA: Diagnosis not present

## 2021-04-29 DIAGNOSIS — I10 Essential (primary) hypertension: Secondary | ICD-10-CM | POA: Diagnosis not present

## 2021-04-29 LAB — RESP PANEL BY RT-PCR (FLU A&B, COVID) ARPGX2
Influenza A by PCR: NEGATIVE
Influenza B by PCR: NEGATIVE
SARS Coronavirus 2 by RT PCR: NEGATIVE

## 2021-04-29 LAB — GLUCOSE, CAPILLARY
Glucose-Capillary: 61 mg/dL — ABNORMAL LOW (ref 70–99)
Glucose-Capillary: 62 mg/dL — ABNORMAL LOW (ref 70–99)
Glucose-Capillary: 64 mg/dL — ABNORMAL LOW (ref 70–99)
Glucose-Capillary: 68 mg/dL — ABNORMAL LOW (ref 70–99)
Glucose-Capillary: 73 mg/dL (ref 70–99)
Glucose-Capillary: 87 mg/dL (ref 70–99)

## 2021-04-29 MED ORDER — JUVEN PO PACK
1.0000 | PACK | Freq: Two times a day (BID) | ORAL | Status: DC
  Filled 2021-04-29 (×2): qty 1

## 2021-04-29 MED ORDER — POLYVINYL ALCOHOL 1.4 % OP SOLN
1.0000 [drp] | OPHTHALMIC | Status: DC | PRN
  Administered 2021-04-29: 1 [drp] via OPHTHALMIC
  Filled 2021-04-29: qty 15

## 2021-04-29 MED ORDER — BOOST / RESOURCE BREEZE PO LIQD CUSTOM
1.0000 | Freq: Three times a day (TID) | ORAL | Status: DC
  Administered 2021-04-29 – 2021-05-01 (×5): 1 via ORAL

## 2021-04-29 MED ORDER — PROSOURCE PLUS PO LIQD
30.0000 mL | Freq: Two times a day (BID) | ORAL | Status: DC
  Administered 2021-04-29: 30 mL via ORAL
  Filled 2021-04-29 (×2): qty 30

## 2021-04-29 NOTE — Plan of Care (Signed)

## 2021-04-29 NOTE — Progress Notes (Signed)
Contacted by CSW that pt agreeable to snf in [place]. SNF is needing a [facility] for pt's out-pt HD. Referral made to [facility] admissions today. Will assist as needed.  ? ?Melven Sartorius ?Renal Navigator ?[phone] ?

## 2021-04-29 NOTE — Progress Notes (Signed)
?  Progress Note ? ? ? ?04/29/2021 ?7:33 AM ?7 Days Post-Op ? ?Subjective:  Seen during dialysis, no complaints, tired  ? ?afebrile ?Vitals:  ? 04/29/21 0026 04/29/21 0440  ?BP: 100/70 99/60  ?Pulse: 92 85  ?Resp: 15 17  ?Temp: 97.6 ?F (36.4 ?C) 97.7 ?F (36.5 ?C)  ?SpO2:  99%  ? ? ?Physical Exam: ?Incisions:   ?Right leg wounds open to air ?Left leg AKA with some medial ecchymosis, appears partial thickness ?Severe bilateral lower extremity edema - multifactorial, ESRD, poor nutrition ? ? ?CBC ?   ?Component Value Date/Time  ? WBC 13.6 (H) 04/28/2021 4801  ? RBC 2.80 (L) 04/28/2021 6553  ? HGB 9.7 (L) 04/28/2021 0811  ? HGB 10.4 (L) 06/02/2016 1107  ? HCT 28.8 (L) 04/28/2021 7482  ? HCT 32.9 (L) 06/02/2016 1107  ? PLT 80 (L) 04/28/2021 0811  ? PLT 389 (H) 06/02/2016 1107  ? MCV 102.9 (H) 04/28/2021 7078  ? MCV 94 06/02/2016 1107  ? MCH 34.6 (H) 04/28/2021 6754  ? MCHC 33.7 04/28/2021 0811  ? RDW 18.7 (H) 04/28/2021 4920  ? RDW 16.0 (H) 06/02/2016 1107  ? LYMPHSABS 0.8 04/24/2021 0420  ? LYMPHSABS 2.9 06/02/2016 1107  ? MONOABS 0.6 04/24/2021 0420  ? EOSABS 0.0 04/24/2021 0420  ? EOSABS 0.5 (H) 06/02/2016 1107  ? BASOSABS 0.0 04/24/2021 0420  ? BASOSABS 0.1 06/02/2016 1107  ? ? ?BMET ?   ?Component Value Date/Time  ? NA 131 (L) 04/28/2021 1007  ? NA 142 12/16/2015 1051  ? K 4.5 04/28/2021 0811  ? CL 97 (L) 04/28/2021 0811  ? CO2 22 04/28/2021 0811  ? GLUCOSE 55 (L) 04/28/2021 0811  ? BUN 15 04/28/2021 0811  ? BUN 45 (H) 12/16/2015 1051  ? CREATININE 3.69 (H) 04/28/2021 1219  ? CALCIUM 8.5 (L) 04/28/2021 7588  ? GFRNONAA 19 (L) 04/28/2021 3254  ? GFRAA 9 (L) 09/27/2019 0951  ? ? ?INR ?   ?Component Value Date/Time  ? INR 1.4 (H) 02/13/2021 0304  ? ? ? ?Intake/Output Summary (Last 24 hours) at 04/29/2021 0733 ?Last data filed at 04/29/2021 0100 ?Gross per 24 hour  ?Intake 600 ml  ?Output 818 ml  ?Net -218 ml  ? ? ? ? ?Assessment/Plan:  51 y.o. male is s/p left above knee amputation and debridement of right BKA ? 7 Days  Post-Op ? ?- Dressing removed from left stump and it is viable.  Replaced dressing.   ?Will need to be protected from being soiled - recommend 4x4 and Tegaderms across wound  ?- Right BKA dressing per wound care nursing ?- Needs aggressive nutrition for wound healing - last albumin 1.8 ? - Severe lower extremity edema ?- Pt arrived with failure to thrive, discharge to previous living conditions would likely lead to further wound breakdown. Possible mortality.  ? ? ?Brendan Campbell ?[facility] ?[phone] ?04/29/2021 ?7:33 AM ? ? ? ? ? ? ? ?  ? ?

## 2021-04-29 NOTE — Assessment & Plan Note (Addendum)
Continue with nutritional supplements.  ?Patient with reactive thrombocytopenia with plt 80.  ?

## 2021-04-29 NOTE — Progress Notes (Signed)
Occupational Therapy Treatment ?Patient Details ?Name: Brendan Campbell ?MRN: [id number] ?DOB: 1970-11-15 ?Today's Date: 04/29/2021 ? ? ?History of present illness 51 yo admitted 3/14 from vascular office with infection. 3/16 pt s/p Left BKA>AKA and Rt BKA debridement. PMhx: Lt BKA Jan 2023, ESRD on HD MWF, PVD, DM, HTN, HLD, anxiety, depression, medically noncompliant, HFrEF 20-25%, COPD ?  ?OT comments ? Patient received in supine and not agreeable to getting to EOB due to complaints of sacral pain but was willing to address attempting scrotum sling.  Mesh underwear attempted to be used to support scrotum.  Several attempts made by therapist and patient to support scrotum but patient stated it was too painful to attempt or lift scrotum. Possible attempt on later date when patient has fewer complaints of pain. Acute OT to continue to follow.   ? ?Recommendations for follow up therapy are one component of a multi-disciplinary discharge planning process, led by the attending physician.  Recommendations may be updated based on patient status, additional functional criteria and insurance authorization. ?   ?Follow Up Recommendations ? Skilled nursing-short term rehab (<3 hours/day)  ?  ?Assistance Recommended at Discharge Intermittent Supervision/Assistance  ?Patient can return home with the following ? A lot of help with walking and/or transfers;A lot of help with bathing/dressing/bathroom;Assist for transportation;Help with stairs or ramp for entrance;Assistance with cooking/housework ?  ?Equipment Recommendations ? Other (comment) (drop arm BSC)  ?  ?Recommendations for Other Services   ? ?  ?Precautions / Restrictions Precautions ?Precautions: Fall;Other (comment) ?Precaution Comments: right fem line ?Restrictions ?Weight Bearing Restrictions: Yes ?RLE Weight Bearing: Non weight bearing (right BKA) ?LLE Weight Bearing: Non weight bearing (left AKA)  ? ? ?  ? ?Mobility Bed Mobility ?  ?  ?  ?  ?  ?  ?  ?  ?   ? ?Transfers ?  ?  ?  ?  ?  ?  ?  ?  ?  ?  ?  ?  ?Balance   ?  ?  ?  ?  ?  ?  ?  ?  ?  ?  ?  ?  ?  ?  ?  ?  ?  ?  ?   ? ?ADL either performed or assessed with clinical judgement  ? ?ADL   ?  ?  ?  ?  ?  ?  ?  ?  ?  ?  ?  ?  ?  ?  ?  ?  ?  ?  ?  ?  ?  ? ?Extremity/Trunk Assessment   ?  ?  ?  ?  ?  ? ?Vision   ?  ?  ?Perception   ?  ?Praxis   ?  ? ?Cognition Arousal/Alertness: Awake/alert ?Behavior During Therapy: [facility] for tasks assessed/performed ?Overall Cognitive Status: Impaired/Different from baseline ?Area of Impairment: Problem solving, Safety/judgement ?  ?  ?  ?  ?  ?  ?  ?  ?  ?  ?  ?  ?Safety/Judgement: Decreased awareness of deficits, Decreased awareness of safety ?  ?Problem Solving: Slow processing ?General Comments: states he needs morphine to address pain so he can do more ?  ?  ?   ?Exercises   ? ?  ?Shoulder Instructions   ? ? ?  ?General Comments Patient seen to address scrotum sling.  Patient stated lifting scrotum was too painful.  Unable to full apply sling  ? ? ?Pertinent Vitals/ Pain  Pain Assessment ?Pain Assessment: Faces ?Faces Pain Scale: Hurts even more ?Pain Location: LLE, hips, back, sacral area, scrotum ?Pain Descriptors / Indicators: Discomfort, Grimacing, Burning ?Pain Intervention(s): Limited activity within patient's tolerance, Monitored during session, Repositioned ? ?Home Living   ?  ?  ?  ?  ?  ?  ?  ?  ?  ?  ?  ?  ?  ?  ?  ?  ?  ?  ? ?  ?Prior Functioning/Environment    ?  ?  ?  ?   ? ?Frequency ? Min 2X/week  ? ? ? ? ?  ?Progress Toward Goals ? ?OT Goals(current goals can now be found in the care plan section) ? Progress towards OT goals: Not progressing toward goals - comment (limited by pain) ? ?Acute Rehab OT Goals ?Patient Stated Goal: get better ?OT Goal Formulation: With patient ?Time For Goal Achievement: 05/10/21 ?Potential to Achieve Goals: Fair ?ADL Goals ?Pt Will Perform Upper Body Dressing: with min assist;sitting;standing ?Pt Will Perform Lower Body Dressing:  with mod assist;with min assist;sitting/lateral leans ?Pt Will Transfer to Toilet: with mod assist;anterior/posterior transfer;with transfer board;bedside commode  ?Plan Discharge plan remains appropriate   ? ?Co-evaluation ? ? ?   ?  ?  ?  ?  ? ?  ?AM-PAC OT "6 Clicks" Daily Activity     ?Outcome Measure ? ? Help from another person eating meals?: None ?Help from another person taking care of personal grooming?: A Little ?Help from another person toileting, which includes using toliet, bedpan, or urinal?: Total ?Help from another person bathing (including washing, rinsing, drying)?: Total ?Help from another person to put on and taking off regular upper body clothing?: A Lot ?Help from another person to put on and taking off regular lower body clothing?: Total ?6 Click Score: 12 ? ?  ?End of Session   ? ?OT Visit Diagnosis: Unsteadiness on feet (R26.81);Other abnormalities of gait and mobility (R26.89);Muscle weakness (generalized) (M62.81);Pain ?  ?Activity Tolerance Patient limited by pain ?  ?Patient Left in bed;with call bell/phone within reach;with bed alarm set ?  ?Nurse Communication Other (comment) (pain issues affected use of scrotum sling) ?  ? ?   ? ?Time: 2919-1660 ?OT Time Calculation (min): 16 min ? ?Charges: OT General Charges ?$OT Visit: 1 Visit ?OT Treatments ?$Therapeutic Activity: 8-22 mins ? ?Lodema Hong, OTA ?Acute Rehabilitation Services  ?Pager [phone] ?Office [phone] ? ? ?[place] ?04/29/2021, 3:02 PM ?

## 2021-04-29 NOTE — TOC Initial Note (Addendum)
Transition of Care (TOC) - Initial/Assessment Note  ? ? ?Patient Details  ?Name: Brendan Campbell ?MRN: [id number] ?Date of Birth: [birth year] ? ?Transition of Care (TOC) CM/SW Contact:    ?Milas Gain, LCSWA ?Phone Number: ?04/29/2021, 11:46 AM ? ?Clinical Narrative:                 ? ?Update- CSW provided patient with SNF bed offers. Patient accepted SNF bed offer with [facility]. CSW spoke with Crystal with [facility] who confirmed SNF bed offer for patient. Crystal with [facility] confirmed she will start insurance authorization for patient.Crystal informed CSW patient will need to switch HD centers while patient is in short term rehab at [facility]. Currently Patient goes to [facility]. CSW spoke with patient at bedside and patient is in agreement to switch HD centers while in short term rehab. CSW informed renal navigator. Olivia Mackie renal navigator currently working on finding an HD center for patient while at [facility] for short term rehab. CSW will continue to follow and assist with patients dc planning needs. ? ? CSW received consult for possible SNF placement at time of discharge. CSW spoke with patient at bedside regarding PT recommendation of SNF placement at time of discharge. Patient reports he comes from home with roommate and roommates son.Patient expressed understanding of PT recommendation and is agreeable to SNF placement at time of discharge. Patient agreeable for CSW to fax out initial referral to area SNF facilities .CSW discussed insurance authorization process with patient. Patient reports he has not received his COVID vaccines. Patient expressed being hopeful for rehab and to feel better soon. No further questions reported at this time. CSW to continue to follow and assist with discharge planning needs.  ?  ? ? ?Patient Goals and CMS Choice ?  ?  ?  ? ?Expected Discharge Plan and Services ?  ?  ?  ?  ?  ?                ?  ?  ?  ?  ?  ?  ?  ?  ?  ?  ? ?Prior Living  Arrangements/Services ?  ?  ?  ?       ?  ?  ?  ?  ? ?Activities of Daily Living ?Home Assistive Devices/Equipment: Wheelchair ?ADL Screening (condition at time of admission) ?Patient's cognitive ability adequate to safely complete daily activities?: Yes ?Is the patient deaf or have difficulty hearing?: No ?Does the patient have difficulty seeing, even when wearing glasses/contacts?: No ?Does the patient have difficulty concentrating, remembering, or making decisions?: No ?Patient able to express need for assistance with ADLs?: Yes ?Does the patient have difficulty dressing or bathing?: Yes ?Independently performs ADLs?: Yes (appropriate for developmental age) ?Does the patient have difficulty walking or climbing stairs?: Yes ?Weakness of Legs: Both ?Weakness of Arms/Hands: Both ? ?Permission Sought/Granted ?  ?  ?   ?   ?   ?   ? ?Emotional Assessment ?  ?  ?  ?  ?  ?  ? ?Admission diagnosis:  CHF (congestive heart failure) ([place]) [I50.9] ?Osteomyelitis ([place]) [M86.9] ?Postoperative infection, unspecified type, initial encounter [T81.40XA] ?Patient Active Problem List  ? Diagnosis Date Noted  ? Amputation stump infection ([place]) 04/28/2021  ? ESRD on dialysis [facility]) 04/28/2021  ? Type 2 diabetes mellitus ([place]) 04/28/2021  ? Essential hypertension 04/28/2021  ? Paroxysmal atrial fibrillation ([place]) 04/28/2021  ? PVD (peripheral vascular disease) ([place]) 04/28/2021  ? COPD with acute exacerbation (  [place]) 04/28/2021  ? Nausea and vomiting   ? Intractable nausea and vomiting 03/10/2021  ? Noncompliance 03/10/2021  ? MRSA bacteremia 02/13/2021  ? Rash 02/13/2021  ? Lower extremity cellulitis 02/04/2021  ? Right endophthalmia 11/12/2020  ? Chronic systolic CHF (congestive heart failure) ([place]) 07/27/2020  ? Volume overload 07/20/2020  ? COVID-19 virus infection 07/20/2020  ? Acute on chronic respiratory failure with hypoxia ([place]) 07/03/2020  ? Acute on chronic combined systolic and diastolic CHF (congestive heart failure) ([place])  04/08/2020  ? Left hemiparesis ([place]) 04/08/2020  ? Lumbar spondylosis 04/08/2020  ? Atrial fibrillation with RVR ([place]) 04/08/2020  ? Pain in limb 12/29/2019  ? Cervical radiculopathy 12/29/2019  ? CHF (congestive heart failure) ([place]) 12/13/2019  ? Paresthesia 09/24/2019  ? Hypocalcemia   ? Chronic atrial fibrillation ([facility])   ? Hyperparathyroidism ([place])   ? Cardiac arrest ([place]) 06/07/2019  ? Neuropathy   ? Peripheral vascular disease ([place])   ? ESRD on hemodialysis ([place])   ? Hyperkalemia 10/05/2018  ? Spondylosis of lumbar spine 03/08/2016  ? Lumbar radiculopathy 03/08/2016  ? S/P bilateral BKA (below knee amputation) ([place]) 03/05/2015  ? Type 2 diabetes mellitus with diabetic chronic kidney disease ([place]) 03/03/2015  ? Hyperlipidemia 03/03/2015  ? Low back pain 03/03/2015  ? ?PCP:  Monico Blitz, MD ?Pharmacy:   ?[id number] - [place], [place] [address] ?[place] ?[place] [address] ?Phone: [phone] Fax: [phone] ? ? ? ? ?Social Determinants of Health (SDOH) Interventions ?  ? ?Readmission Risk Interventions ? ?  07/21/2020  ? 11:11 AM  ?Readmission Risk Prevention Plan  ?Transportation Screening Complete  ?PCP or Specialist Appt within 3-5 Days Complete  ?[place] or Home Care Consult Complete  ?Social Work Consult for [place] Planning/Counseling Complete  ?Palliative Care Screening Not Applicable  ?Medication Review [employer]) Complete  ? ? ? ?

## 2021-04-29 NOTE — Progress Notes (Addendum)
Nutrition Follow-up ? ?DOCUMENTATION CODES:  ?Non-severe (moderate) malnutrition in context of chronic illness ? ?INTERVENTION:  ?Continue current diet as ordered ?Check Vitamin C, A, and zinc lab values ?Discontinue Nepro and Ensure Enlive. Add Boost Breeze po TID, each supplement provides 250 kcal and 9 grams of protein ?Continue renavite daily ?1 packet Juven BID, each packet provides 95  calories, 2.5 grams of protein (collagen) + micronutrients ? ?NUTRITION DIAGNOSIS:  ?Increased nutrient needs related to chronic illness, wound healing, post-op healing (ESRD on HD) as evidenced by estimated needs. ?- remains applicable ? ?Additional dx added 3/23: ?Moderate protein-calorie malnutrition in the context of chronic illness related to poor oral intake as evidenced by severe fat deficits and moderate muscle deficits ? ?GOAL:  ?Patient will meet greater than or equal to 90% of their needs ?- supplements in place ? ?MONITOR:  ?PO intake, Supplement acceptance, Labs, Weight trends ? ?REASON FOR ASSESSMENT:  ?Rounds (wounds (AKA planned)) ?  ? ?ASSESSMENT:  ?51 y.o. male with history of PAF, CHF, DM type 2, diabetic neuropathy, ESRD on HD MWF, PTSD, anemia, and S/P bilateral BKAs presented to ED from vascular appointment with worsening left BKA stump infection ? ?Pt resting in bed at the time of assessment, lunch tray recently arrived. Pt states that he is going to eat, but he needs to clean himself up first. Reviewed intake this admission, and appears poor. Essentially all supplements have been refused. Discussed this with pt. States that he does not like the milk-based supplements, but he does like boost breeze, agreeable to receiving 3x/d. Also agrees to try prosource plus and juven.  ? ?Reviewed the importance of  nutrition and micronutrients with HD and wound healing. Pt only eats 1-2 meals a day at home. Hard to obtain a nutrition hx, pt's thoughts are scattered at times. ? ?Upon exam, deficits noted. Pt does have  significant edema present to his mid-section. States that he was not able to have it all removed with HD.  ? ?Noted - micronutrient labs were not drawn. Scheduled for the AM to assess for deficiencies, which are highly suspected. ? ?Average Meal Intake: ?3/15-3/23: 39% intake x 7 recorded meals ? ?Nutritionally Relevant Medications: ?Scheduled Meds: ? vitamin C  250 mg Oral BID  ? calcium acetate  667 mg Oral TID WC  ? cholecalciferol  1,000 Units Oral Q M,W,F  ? Ensure Enlive  237 mL Oral TID BM  ? NEPRO CARB STEADY  237 mL Oral TID BM  ? multivitamin  1 tablet Oral QHS  ? pantoprazole  40 mg Oral Daily  ? sodium zirconium cyclosilicate  10 g Oral Once  ? zinc sulfate  220 mg Oral Daily  ? ?PRN Meds: bisacodyl, diphenhydrAMINE, promethazine, senna-docusate ? ?Labs Reviewed: ?Drawn 3/22 ?Sodium 131, Chloride 97 ?Bun 15, Creatinine 3.69 ?Phosphorus 5.8 ?CBG ranges from 55-75 mg/dL over the last 24 hours  ? ?NUTRITION - FOCUSED PHYSICAL EXAM: ?Flowsheet Row Most Recent Value  ?Orbital Region Severe depletion  ?Upper Arm Region Mild depletion  ?Thoracic and Lumbar Region No depletion  [significant edema to the trunk]  ?Buccal Region Severe depletion  ?Temple Region Mild depletion  ?Clavicle Bone Region Moderate depletion  ?Clavicle and Acromion Bone Region Moderate depletion  ?Scapular Bone Region Moderate depletion  ?Dorsal Hand Mild depletion  ?Patellar Region Unable to assess  ?Anterior Thigh Region Unable to assess  ?Posterior Calf Region Unable to assess  ?Edema (RD Assessment) Moderate  ?Hair Reviewed  [thin, whispy]  ?Eyes Reviewed  ?  Mouth Reviewed  ?Skin Reviewed  [significant bruising and scabbing]  ?Nails Reviewed  ? ?Diet Order:   ?Diet Order   ? ?       ?  Diet regular Room service appropriate? Yes; Fluid consistency: Thin; Fluid restriction: 1200 mL Fluid  Diet effective now       ?  ? ?  ?  ? ?  ? ? ?EDUCATION NEEDS:  ?Not appropriate for education at this time ? ?Skin:  Skin Assessment: Skin Integrity  Issues: ?Skin Integrity Issues:: Incisions ?Incisions: (closed) - leg right; leg left ? ?Last BM:  3/13 ? ?Height:  ?Ht Readings from Last 1 Encounters:  ?04/22/21 5\' 10"  (1.778 m)  ? ?Weight:  ?Wt Readings from Last 1 Encounters:  ?04/28/21 92.1 kg  ? ? ?Ideal Body Weight:  63.5 kg (adjusted by 34.7% for AKA and BKA) ? ?BMI:  Body mass index is 29.13 kg/m?. ? ?Estimated Nutritional Needs:  ?Kcal:  2300-2500 kcal/d ?Protein:  120-150 g/d ?Fluid:  1L + UOP ? ? ?Ranell Patrick, RD, LDN ?Clinical Dietitian ?RD pager # available in [place]  ?After hours/weekend pager # available in [place] ?

## 2021-04-29 NOTE — Plan of Care (Signed)
  Problem: Education: Goal: Knowledge of General Education information will improve Description: Including pain rating scale, medication(s)/side effects and non-pharmacologic comfort measures Outcome: Progressing   Problem: Clinical Measurements: Goal: Ability to maintain clinical measurements within normal limits will improve Outcome: Progressing   Problem: Nutrition: Goal: Adequate nutrition will be maintained Outcome: Progressing   Problem: Pain Managment: Goal: General experience of comfort will improve Outcome: Progressing   Problem: Safety: Goal: Ability to remain free from injury will improve Outcome: Progressing   

## 2021-04-29 NOTE — Progress Notes (Signed)
Medi - honey applied to Rt Stump per MD order. Covered with foam 6" x 6". Tolerated well. Resp swab sent to lab.  ?

## 2021-04-29 NOTE — Progress Notes (Signed)
Hypoglycemic Event ? ?CBG: 62 ? ?Treatment: 4 oz juice/soda ? ?Symptoms: None ? ?Follow-up CBG: Time: 0436 CBG Result: 73 ? ?Possible Reasons for Event: Inadequate meal intake and Other: poor peripheral circulation ? ?Comments/MD notified: Frozen meatloaf dinner and apple juice given. ? ? ? ?Jodell Cipro ? ? ?

## 2021-04-29 NOTE — Progress Notes (Signed)
?Progress Note ? ? ?Patient: Brendan Campbell ZHY:[id number] DOB: 01-21-1971 DOA: 04/20/2021     9 ?DOS: the patient was seen and examined on 04/29/2021 ?  ?Brief hospital course: ?Brendan Campbell was admitted to the hospital with the working diagnosis of left BKA infected wound.  ? ?51 y.o. 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  ?Patient underwent left leg BKA on 02/16/2021.  In February, he started having pain at the BKA site with some foul-smelling discharge. On 3/14, he was seen by vascular surgery in the office, >concern of deep tissue infection, sent to ED ?On his initial physical examination his blood pressure was 105/83, HR 71, RR 11 and 02 saturation 100% with temp 95.9  ?Lungs clear to auscultation, heart with S1 and S2 present and rhythmic, abdomen soft, positive lower extremity edema, left BKA with ulcerated wound with full smell and thin discharge.  ? ?Na 134, K 5,6 CL 100, bicarbonate 21 glucose 130, bun 33 cr 6,33 ?Wbc 9,7 hgb 11,6 hct 36.4 plt 165  ?Sars covid 19 negative  ? ?Chest radiograph with cardiomegaly with no infiltrates.  ? ?EKG 69 bpm, right axis deviation, interventricular conduction delay, qtc 550, 1st degree AV block, sinus rhythm with q wave in lead II, III, AvF, V1 to V6, J point elevation V2 and V3 no significant T wave changes.  ? ?Patient was placed on broad spectrum antibiotic therapy.  ? ?3/16, patient underwent revision of left AKA from BKA, debridement right AKA. ?Because of intraoperative hypotension, patient required pressors and was briefly admitted to ICU, subsequently BP stabilized and transferred to  [facility] service, on IV antibiotics, IV heparin, D10 gtt and Dilaudid PCA ?-Getting extra HD for volume removal ?-Heparin briefly held for right arm/elbow hematoma.  ? ?Patient now off PCA pump with good toleration ?Pending  transfer to SNF for further care.  ?  ? ?Assessment and Plan: ?* Amputation stump infection ([place]) ?03/21 left side above the knee amputation, right sided incision and debridement of previous below the knee amputation.  ? ?Continue pain control  ?He has completed antibiotic therapy.  ?Continue to follow vascular surgery recommendations, ?PT and OT  ?Right stump wound continue local care,. ?Left wound looks clean with no drainage.  ? ?CHF (congestive heart failure) ([place]) ?No clinical signs of decompensation.  ?Continue close blood pressure monitoring for hypotension. ?Patient is off vasopressors.  ?He has a large region of ecchymosis on his right upper extremity.  ?Continue fluid control with ultrafiltration per hemodialysis.  ?Systolic blood pressure has been 99 to 118 mmHg.  ?  ? ?Malnutrition of moderate degree ?Continue with nutritional supplements.  ?Patient with reactive thrombocytopenia with plt 80.  ? ?COPD with acute exacerbation ([place]) ?No signs of exacerbation  ? ?PVD (peripheral vascular disease) ([place]) ?Continue with aspirin and statin therapy ? ? ?Paroxysmal atrial fibrillation ([facility]) ?Continue rate control with amiodarone ?Resumed anticoagulation with apixaba.  ?Stable right upper extremity ecchymosis.  ? ? ?Essential hypertension ?Continue blood pressure monitoring  ?On 10 mg midodrine bid.  ? ?Type 2 diabetes mellitus with hyperlipidemia ([place]) ?Patient had d10 for hypoglycemia. ? ?Patient is tolerating po wel, his fasting glucose continue to be low at 55. ?Capillary 73, 64, 61  ?Continue with statin therapy.  ? ? ?ESRD on dialysis [facility]) ?Hyperkalemia. Hyponatremia.  ?Continue close blood pressure support with midodrine.  ?Anemia of chronic renal  disease with hgb stable  ?Plan to continue renal replacement therapy per nephrology recommendations.  ?Plan for extra treatment on Saturday.  ? ?Metabolic bone disease, continue with phoslo  ?Anemia of chronic renal disease, hgb has been stable at 9,7 and Hct at  28.8  ? ? ? ? ? ?  ? ?Subjective: patient with no chest pain or dyspnea, wound pain is controlled with analgesics  ? ?Physical Exam: ?Vitals:  ? 04/29/21 0816 04/29/21 0833 04/29/21 0900 04/29/21 1158  ?BP: 118/82   (!) 87/65  ?Pulse:    88  ?Resp: 18 20 14 16   ?Temp: 97.6 ?F (36.4 ?C)   (!) 97.5 ?F (36.4 ?C)  ?TempSrc: Oral   Oral  ?SpO2:    100%  ?Weight:      ?Height:      ? ?Neurology awake and alert ?ENT with mild pallor ?Cardiovascular with S1 and S2 present and rhythmic, with no gallops or murmurs, no rubs ?No JVD ?Positive thigh edema and scrotal edema ?Respiratory with no wheezing or rhonchi  ?Abdomen soft  ?Data Reviewed: ? ? ? ?Family Communication: no family at the bedside  ? ?Disposition: ?Status is: Inpatient ?Remains inpatient appropriate because: pending transfer to SNF  ? Planned Discharge Destination: Skilled nursing facility ? ? ? ? ? ?Author: ?Brendan Millers, MD ?04/29/2021 3:46 PM ? ?For on call review [email or web address].  ?

## 2021-04-29 NOTE — Progress Notes (Signed)
?[facility] ?Progress Note  ? ?Subjective:   patient seen and examined this AM. No acute events. More awake today. Tolerated HD yesterday but only with net uf 0.8L (UF limited due to intradialytic hypotension). He is still bothered by his scrotal edema. ? ?Objective ?Vitals:  ? 04/29/21 0440 04/29/21 0816 04/29/21 0833 04/29/21 0900  ?BP: 99/60 118/82    ?Pulse: 85     ?Resp: 17 18 20 14   ?Temp: 97.7 ?F (36.5 ?C) 97.6 ?F (36.4 ?C)    ?TempSrc: Oral Oral    ?SpO2: 99%     ?Weight:      ?Height:      ? ?Physical Exam ?General appearance: Chronically ill-appearing, lying in bed, lethargic ?Resp: Bilateral chest rise with no increased work of breathing ?Cardio: regular rate and rhythm ?GI: soft, non-tender ?Extremities: right bka and left aka, +thigh edema b/l ?Neuro: awake, alert ?GU: +scrotal edema ?  ? ?Additional Objective ?Labs: ?Basic Metabolic Panel: ?Recent Labs  ?Lab 04/22/21 ?1736 04/23/21 ?0355 04/26/21 ?1454 04/27/21 ?1638 04/28/21 ?4536  ?NA 132*   < > 133* 132* 131*  ?K 3.8   < > 3.6 3.7 4.5  ?CL 97*   < > 98 98 97*  ?CO2 21*   < > 27 26 22   ?GLUCOSE 106*   < > 96 56* 55*  ?BUN 23*   < > 8 10 15   ?CREATININE 4.81*   < > 2.44* 2.97* 3.69*  ?CALCIUM 8.4*   < > 7.6* 7.6* 8.5*  ?PHOS 5.8*  --   --   --   --   ? < > = values in this interval not displayed.  ? ?Liver Function Tests: ?Recent Labs  ?Lab 04/22/21 ?1736 04/27/21 ?4680  ?AST  --  18  ?ALT  --  14  ?ALKPHOS  --  76  ?BILITOT  --  1.7*  ?PROT  --  4.7*  ?ALBUMIN 2.2* 1.8*  ? ?No results for input(s): LIPASE, AMYLASE in the last 168 hours. ?CBC: ?Recent Labs  ?Lab 04/23/21 ?3212 04/24/21 ?0420 04/25/21 ?2482 04/26/21 ?0415 04/27/21 ?5003 04/28/21 ?7048  ?WBC 12.6* 16.4* 16.2* 13.1* 12.7* 13.6*  ?NEUTROABS 12.0* 14.9*  --   --   --   --   ?HGB 10.4* 10.2* 9.2* 9.0* 8.6* 9.7*  ?HCT 31.0* 30.6* 28.0* 27.5* 25.5* 28.8*  ?MCV 103.7* 104.4* 104.5* 104.6* 103.2* 102.9*  ?PLT 147* 151 123* 97* 90* 80*  ? ?Blood Culture ?   ?Component Value  Date/Time  ? SDES BLOOD RIGHT FOREARM 04/20/2021 1322  ? SPECREQUEST  04/20/2021 1322  ?  BOTTLES DRAWN AEROBIC AND ANAEROBIC Blood Culture adequate volume  ? CULT  04/20/2021 1322  ?  NO GROWTH 5 DAYS ?Performed at [facility], [place] [address], [place], [place] [address] ?  ? REPTSTATUS 04/25/2021 FINAL 04/20/2021 1322  ? ? ?Cardiac Enzymes: ?No results for input(s): CKTOTAL, CKMB, CKMBINDEX, TROPONINI in the last 168 hours. ?CBG: ?Recent Labs  ?Lab 04/28/21 ?1857 04/28/21 ?2032 04/29/21 ?0022 04/29/21 ?9450 04/29/21 ?0814  ?GLUCAP 62* 75 62* 73 64*  ? ?Iron Studies: No results for input(s): IRON, TIBC, TRANSFERRIN, FERRITIN in the last 72 hours. ?@lablastinr3 @ ?Studies/Results: ?No results found. ?Medications: ? promethazine (PHENERGAN) injection (IM or IVPB) Stopped (04/23/21 0325)  ? ? amiodarone  200 mg Oral Daily  ? apixaban  5 mg Oral BID  ? vitamin C  250 mg Oral BID  ? atorvastatin  10 mg Oral Q M,W,F  ?  calcium acetate  667 mg Oral TID WC  ? cholecalciferol  1,000 Units Oral Q M,W,F  ? darbepoetin (ARANESP) injection - DIALYSIS  100 mcg Intravenous Q Wed-HD  ? dextrose  1 Tube Oral STAT  ? feeding supplement  237 mL Oral TID BM  ? feeding supplement (NEPRO CARB STEADY)  237 mL Oral TID BM  ? fentaNYL (SUBLIMAZE) injection  25 mcg Intravenous Q4H  ? leptospermum manuka honey  1 application. Topical Daily  ? midodrine  10 mg Oral BID WC  ? multivitamin  1 tablet Oral QHS  ? pantoprazole  40 mg Oral Daily  ? sodium zirconium cyclosilicate  10 g Oral Once  ? zinc sulfate  220 mg Oral Daily  ? ? ?Dialysis Orders:  [name]  on MWF ? 4h 28min  95 kg   1K/2.25 bath  Hep 1000+ 500u/hr  LUE AVG  300/500   ?- prosthetic weighs 1.6 kg ? - Epogen 1000   Units IV/HD   ?  ?Assessment/Plan ?**stump infection:  broad spectrum antibiotics.  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  Complicated by hypotension but now more stable.  Management per vascular ?  ?**ESRD on HD:  HD per usual schedule MWF - heparin per outpt held  due to systemic heparin gtt for periop a fib anticoag.  HD on MWF schedule, with midodrine support.Anticipating that he'll need an extra treatment on Saturday for UF ?  ?**Anemia:  follow, transfuse prn for hgb <7  On low dose epogen outpt. Aranesp 110mcg started 3/22 ? ?**CKD MBD: c/w phoslo ?  ?**Nutrition: supplements, per primary ? ?**PAF: on eliquis, mgmt per primary ? ?Gean Quint, MD ?[facility] ?

## 2021-04-29 NOTE — Progress Notes (Signed)
PT Cancellation Note ? ?Patient Details ?Name: Brendan Campbell ?MRN: [id number] ?DOB: 07-14-1970 ? ? ?Cancelled Treatment:    Reason Eval/Treat Not Completed: Patient declined, no reason specified (pt reports sacral pain and soreness and reports lack of ability to attempt transfers for several days and requests return 3/27) ? ? ?[name] ?04/29/2021, 8:29 AM ?Shamila Lerch P, PT ?[facility] ?Pager: [phone] ?Office: [phone] ? ?

## 2021-04-29 NOTE — Progress Notes (Addendum)
?  Progress Note ? ? ? ?04/29/2021 ?9:02 AM ?7 Days Post-Op ? ?Subjective:  says his wallet and keys are gone.  He says he had them when he went to pre op and now he doesn't.   ? ?afebrile ?Vitals:  ? 04/29/21 0833 04/29/21 0900  ?BP:    ?Pulse:    ?Resp: 20 14  ?Temp:    ?SpO2:    ? ? ?Physical Exam: ?Incisions:  left stump clean and viable.  Slightly more ecchymosis on the medial aspect of incision.  Right stump essentially unchanged.   ? ? ?CBC ?   ?Component Value Date/Time  ? WBC 13.6 (H) 04/28/2021 9381  ? RBC 2.80 (L) 04/28/2021 0175  ? HGB 9.7 (L) 04/28/2021 0811  ? HGB 10.4 (L) 06/02/2016 1107  ? HCT 28.8 (L) 04/28/2021 1025  ? HCT 32.9 (L) 06/02/2016 1107  ? PLT 80 (L) 04/28/2021 0811  ? PLT 389 (H) 06/02/2016 1107  ? MCV 102.9 (H) 04/28/2021 8527  ? MCV 94 06/02/2016 1107  ? MCH 34.6 (H) 04/28/2021 7824  ? MCHC 33.7 04/28/2021 0811  ? RDW 18.7 (H) 04/28/2021 2353  ? RDW 16.0 (H) 06/02/2016 1107  ? LYMPHSABS 0.8 04/24/2021 0420  ? LYMPHSABS 2.9 06/02/2016 1107  ? MONOABS 0.6 04/24/2021 0420  ? EOSABS 0.0 04/24/2021 0420  ? EOSABS 0.5 (H) 06/02/2016 1107  ? BASOSABS 0.0 04/24/2021 0420  ? BASOSABS 0.1 06/02/2016 1107  ? ? ?BMET ?   ?Component Value Date/Time  ? NA 131 (L) 04/28/2021 6144  ? NA 142 12/16/2015 1051  ? K 4.5 04/28/2021 0811  ? CL 97 (L) 04/28/2021 0811  ? CO2 22 04/28/2021 0811  ? GLUCOSE 55 (L) 04/28/2021 0811  ? BUN 15 04/28/2021 0811  ? BUN 45 (H) 12/16/2015 1051  ? CREATININE 3.69 (H) 04/28/2021 3154  ? CALCIUM 8.5 (L) 04/28/2021 0086  ? GFRNONAA 19 (L) 04/28/2021 7619  ? GFRAA 9 (L) 09/27/2019 0951  ? ? ?INR ?   ?Component Value Date/Time  ? INR 1.4 (H) 02/13/2021 0304  ? ? ? ?Intake/Output Summary (Last 24 hours) at 04/29/2021 0902 ?Last data filed at 04/29/2021 0100 ?Gross per 24 hour  ?Intake 480 ml  ?Output 818 ml  ?Net -338 ml  ? ? ? ?Assessment/Plan:  51 y.o. male is s/p left above knee amputation and debridement of right BKA  ? 7 Days Post-Op ? ?-left stump currently viable; more  ecchymosis medially.  ?-continue to protect incision and right stump from soilage.   ?-continue recommendations from [facility] ?-pt has f/u appt to check stumps ?-pacu RN checked with security and previous units and they do not have his wallet or keys.  ? ? ?Leontine Locket, PA-C ?[facility] ?[phone] ?04/29/2021 ?9:02 AM ? ?VASCULAR STAFF ADDENDUM: ?I have independently interviewed and examined the patient. ?I agree with the above.  ?Pt continues to have severe bilateral lower extremity edema. This is multifactorial.  ?Left AKA wound now with bullae from fluid overload.  ?Right leg with woody, pitting edema.  ?Wounds will likely not heal in current state.  ?Will continue to follow.  ? ?[name], MD ?[facility] ?Office Phone Number: [phone] ?04/29/2021 8:55 PM ? ? ? ? ? ? ? ?  ? ?

## 2021-04-30 DIAGNOSIS — I10 Essential (primary) hypertension: Secondary | ICD-10-CM | POA: Diagnosis not present

## 2021-04-30 DIAGNOSIS — J441 Chronic obstructive pulmonary disease with (acute) exacerbation: Secondary | ICD-10-CM

## 2021-04-30 DIAGNOSIS — T8740 Infection of amputation stump, unspecified extremity: Secondary | ICD-10-CM | POA: Diagnosis not present

## 2021-04-30 DIAGNOSIS — N186 End stage renal disease: Secondary | ICD-10-CM | POA: Diagnosis not present

## 2021-04-30 LAB — RENAL FUNCTION PANEL
Albumin: 2.4 g/dL — ABNORMAL LOW (ref 3.5–5.0)
Anion gap: 12 (ref 5–15)
BUN: 12 mg/dL (ref 6–20)
CO2: 22 mmol/L (ref 22–32)
Calcium: 8.6 mg/dL — ABNORMAL LOW (ref 8.9–10.3)
Chloride: 99 mmol/L (ref 98–111)
Creatinine, Ser: 3.1 mg/dL — ABNORMAL HIGH (ref 0.61–1.24)
GFR, Estimated: 24 mL/min — ABNORMAL LOW (ref >60)
Glucose, Bld: 68 mg/dL — ABNORMAL LOW (ref 70–99)
Phosphorus: 2.4 mg/dL — ABNORMAL LOW (ref 2.5–4.6)
Potassium: 4 mmol/L (ref 3.5–5.1)
Sodium: 133 mmol/L — ABNORMAL LOW (ref 135–145)

## 2021-04-30 LAB — GLUCOSE, CAPILLARY
Glucose-Capillary: 64 mg/dL — ABNORMAL LOW (ref 70–99)
Glucose-Capillary: 66 mg/dL — ABNORMAL LOW (ref 70–99)
Glucose-Capillary: 79 mg/dL (ref 70–99)
Glucose-Capillary: 80 mg/dL (ref 70–99)
Glucose-Capillary: 81 mg/dL (ref 70–99)
Glucose-Capillary: 90 mg/dL (ref 70–99)

## 2021-04-30 LAB — CBC
HCT: 26.9 % — ABNORMAL LOW (ref 39.0–52.0)
Hemoglobin: 8.9 g/dL — ABNORMAL LOW (ref 13.0–17.0)
MCH: 34 pg (ref 26.0–34.0)
MCHC: 33.1 g/dL (ref 30.0–36.0)
MCV: 102.7 fL — ABNORMAL HIGH (ref 80.0–100.0)
Platelets: 64 10*3/uL — ABNORMAL LOW (ref 150–400)
RBC: 2.62 MIL/uL — ABNORMAL LOW (ref 4.22–5.81)
RDW: 19.2 % — ABNORMAL HIGH (ref 11.5–15.5)
WBC: 9.9 10*3/uL (ref 4.0–10.5)
nRBC: 0.2 % (ref 0.0–0.2)

## 2021-04-30 LAB — C-REACTIVE PROTEIN: CRP: 7.2 mg/dL — ABNORMAL HIGH (ref <1.0)

## 2021-04-30 MED ORDER — FENTANYL CITRATE PF 50 MCG/ML IJ SOSY
25.0000 ug | PREFILLED_SYRINGE | Freq: Three times a day (TID) | INTRAMUSCULAR | Status: DC | PRN
  Administered 2021-04-30 – 2021-05-01 (×2): 25 ug via INTRAVENOUS
  Filled 2021-04-30: qty 1

## 2021-04-30 MED ORDER — SALINE SPRAY 0.65 % NA SOLN
1.0000 | Freq: Four times a day (QID) | NASAL | Status: DC | PRN
  Filled 2021-04-30: qty 44

## 2021-04-30 MED ORDER — GLUCOSE 40 % PO GEL: 1.0000 | ORAL | Status: AC

## 2021-04-30 NOTE — Progress Notes (Addendum)
Pt's case being reviewed by [facility] for out-pt HD while pt at snf. Awaiting final approval from [facility] [facility]. ? ?Brendan Campbell ?Renal Navigator ?[phone] ? ?Addendum at 4:53 pm: ?Pt has been accepted at [facility] on TTS. Pt can start on Tuesday, March 28. For first appt, pt needs to arrive at 10:45 to complete paperwork prior to 11:30 chair time. After first appt, pt needs to arrive at 11:15 for 11:30  chair time. Arrangements added to pt's AVS. Update provided to attending, CSW, and nephrologist. Contacted renal PA regarding clinic's need for orders. Pt is supposed to d/c tomorrow after HD. Contacted inpt HD unit to request that pt receive treatment first shift so pt can d/c to snf in a timely manner.  ?

## 2021-04-30 NOTE — Progress Notes (Addendum)
?Progress Note ? ? ?Patient: Brendan Campbell ONG:[id number] DOB: 09-12-1970 DOA: 04/20/2021     10 ?DOS: the patient was seen and examined on 04/30/2021 ?  ?Brief hospital course: ?Mr. Lax was admitted to the hospital with the working diagnosis of left BKA infected wound.  ? ?51 y.o. 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  ?Patient underwent left leg BKA on 02/16/2021.  In February, he started having pain at the BKA site with some foul-smelling discharge. On 3/14, he was seen by vascular surgery in the office, >concern of deep tissue infection, sent to ED ?On his initial physical examination his blood pressure was 105/83, HR 71, RR 11 and 02 saturation 100% with temp 95.9  ?Lungs clear to auscultation, heart with S1 and S2 present and rhythmic, abdomen soft, positive lower extremity edema, left BKA with ulcerated wound with full smell and thin discharge.  ? ?Na 134, K 5,6 CL 100, bicarbonate 21 glucose 130, bun 33 cr 6,33 ?Wbc 9,7 hgb 11,6 hct 36.4 plt 165  ?Sars covid 19 negative  ? ?Chest radiograph with cardiomegaly with no infiltrates.  ? ?EKG 69 bpm, right axis deviation, interventricular conduction delay, qtc 550, 1st degree AV block, sinus rhythm with q wave in lead II, III, AvF, V1 to V6, J point elevation V2 and V3 no significant T wave changes.  ? ?Patient was placed on broad spectrum antibiotic therapy.  ? ?3/16, patient underwent revision of left AKA from BKA, debridement right AKA. ?Because of intraoperative hypotension, patient required pressors and was briefly admitted to ICU, subsequently BP stabilized and transferred to  [facility] service, on IV antibiotics, IV heparin, D10 gtt and Dilaudid PCA ?-Getting extra HD for volume removal ?-Heparin briefly held for right arm/elbow hematoma.  ? ?Patient now off PCA pump with good toleration ?Pending  transfer to SNF for further care.  ?  ? ?Assessment and Plan: ?* Amputation stump infection ([place]) ?03/21 left side above the knee amputation, right sided incision and debridement of previous below the knee amputation.  ? ?Continue pain control with fentanyl and oxycodone. Will decrease frequency of fentanyl in preparation for possible dc to SNF.  ?Continue with oral oxycodone as needed.  ?He has completed antibiotic therapy.  ?Continue to follow vascular surgery recommendations, ?PT and OT  ?Right stump wound continue local care,. ?Left wound looks clean with no drainage.  ? ?CHF (congestive heart failure) ([place]) ?No clinical signs of decompensation.  ?Continue close blood pressure monitoring for hypotension. ?Patient is off vasopressors.  ?He has a large region of ecchymosis on his right upper extremity.  ?Continue fluid control with ultrafiltration per hemodialysis, patient will have extra HD tomorrow for further fluid removal per ultrafiltration.  ? ?Systolic blood pressure in the 100's  ? ? ?  ? ?Malnutrition of moderate degree ?Continue with nutritional supplements.  ?Patient with reactive thrombocytopenia with plt 80.  ? ?COPD with acute exacerbation ([place]) ?No signs of exacerbation  ? ?PVD (peripheral vascular disease) ([place]) ?Continue with aspirin and statin therapy ? ? ?Paroxysmal atrial fibrillation ([facility]) ?On amiodarone and anticoagulation with apixaba.  ?Stable right upper extremity ecchymosis.  ? ? ?Essential hypertension ?Continue blood pressure monitoring  ?On 10 mg midodrine bid.  ? ?Type 2 diabetes mellitus with hyperlipidemia ([place]) ?Patient had d10 for hypoglycemia. ? ?Fasting glucose is 68. ?Patient tolerating po well ?Continue with statin therapy.  ? ? ?  ESRD on dialysis [facility]) ?Hyperkalemia. Hyponatremia.  ?Continue close blood pressure support with midodrine.  ?Anemia of chronic renal disease with hgb stable  ?Plan to continue renal replacement therapy per nephrology recommendations.  ? ?Metabolic  bone disease, continue with phoslo  ?Anemia of chronic renal disease continue with EPO.  ? ?Arrangements being made to continue renal replacement therapy as outpatient.  ? ? ? ? ? ? ?  ? ?Subjective: patient is feeling well, continue to have edema at his lower thighs, no dyspnea or chest pain  ? ?Physical Exam: ?Vitals:  ? 04/30/21 1030 04/30/21 1100 04/30/21 1145 04/30/21 1200  ?BP: 100/67 109/71 97/85 108/73  ?Pulse:   (!) 104 (!) 103  ?Resp: 13 12 16 18   ?Temp:   (!) 97.1 ?F (36.2 ?C)   ?TempSrc:   Oral   ?SpO2:      ?Weight:   89.5 kg   ?Height:      ? ?Neurology awake and alert ?ENT with pallor ?Cardiovascular with S1 and S2 present and rhythmic with no gallops or murmurs ?No JVD ?Positive pitting edema ++ at the thighs bilaterally ?Respiratory with no wheezing ?Abdomen not distended ?Multiple skin ecchymosis, a large are on his right upper extremity  ?Data Reviewed: ? ? ? ?Family Communication: no family at the bedside  ? ?Disposition: ?Status is: Inpatient ?Remains inpatient appropriate because: pending transfer to SNF  ? Planned Discharge Destination: Skilled nursing facility ? ? ? ?Author: ?Tawni Millers, MD ?04/30/2021 4:23 PM ? ?For on call review [email or web address].  ?

## 2021-04-30 NOTE — TOC Progression Note (Addendum)
Transition of Care (TOC) - Progression Note  ? ? ?Patient Details  ?Name: Brendan Campbell ?MRN: [id number] ?Date of Birth: Jul 31, 1970 ? ?Transition of Care (TOC) CM/SW Contact  ?Milas Gain, LCSWA ?Phone Number: ?04/30/2021, 1:59 PM ? ?Clinical Narrative:    ? ?Update 4:41pm-Tracy renal navigator informed CSW that patient has been approved for HD clinic at [facility], Saturday arrive at 11:15am for 11:30 chair time. CSW informed Crystal with [facility] for patients first session which will be Tuesday at [facility] to arrive at 10:45am for paperwork then following days will be regular time arrive at 11:15am for 11:30am chair time. MD informed CSW and renal navigator plan to have HD here in the morning and anticipated dc is for tomorrow. Crystal with [facility] confirmed they can accept patient tomorrow if medically ready.  ? ?Crystal with [facility] confirmed patients medicaid approved. Tracy renal navigator working on HD clinic for patient. Olivia Mackie is waiting approval for [place] for TTS for an 11:30am chair time. Crystal at [facility] confirmed if HD clinic approved they can transport patient to HD clinic. CSW awaiting determination. CSW will continue to follow and assist with patients dc planning needs. ? ?Expected Discharge Plan: [place] ?Barriers to Discharge: Continued Medical Work up ? ?Expected Discharge Plan and Services ?Expected Discharge Plan: [place] ?In-house Referral: Clinical Social Work ?  ?  ?Living arrangements for the past 2 months: [place] ?                ?  ?  ?  ?  ?  ?  ?  ?  ?  ?  ? ? ?Social Determinants of Health (SDOH) Interventions ?  ? ?Readmission Risk Interventions ? ?  07/21/2020  ? 11:11 AM  ?Readmission Risk Prevention Plan  ?Transportation Screening Complete  ?PCP or Specialist Appt within 3-5 Days Complete  ?[place] or Home Care Consult Complete  ?Social Work Consult for [place] Planning/Counseling Complete   ?Palliative Care Screening Not Applicable  ?Medication Review [employer]) Complete  ? ? ?

## 2021-04-30 NOTE — Progress Notes (Signed)
Patient BG 64, RN gave pt two juices which patient refused to drink. RN offered patient a snack, patient refused. Patient said he'd be willing to drink Boost. Boost provided, and pt took a few sips, but then patient fell asleep. So, hypoglycemia protocol initiated. RN brought D50 to room, and when returning to room patient woke back up and then patient was offered  glucose gel as an alternative to D50 or Boost, which he refused. Patient agreeable to drink Boost and finished it. RN then re-checked BG which came up to 90.  ?

## 2021-04-30 NOTE — Progress Notes (Signed)
?[facility] ?Progress Note  ? ?Subjective:   patient frustrated about lab checks and edema. Planning for HD today but no other complaints ? ?Objective ?Vitals:  ? 04/29/21 1659 04/29/21 1918 04/30/21 0004 04/30/21 0347  ?BP:  121/80 103/73 114/81  ?Pulse:  (!) 106 100   ?Resp: 18 16 19 16   ?Temp:  98.2 ?F (36.8 ?C) 98.2 ?F (36.8 ?C) 98.2 ?F (36.8 ?C)  ?TempSrc:  Oral Oral Oral  ?SpO2:  100% 100% 95%  ?Weight:      ?Height:      ? ?Physical Exam ?General appearance: Chronically ill-appearing, lying in bed, no distress ?Resp: Bilateral chest rise with no increased work of breathing ?Cardio: regular rate and rhythm ?GI: soft, non-tender ?Extremities: right bka and left aka, +thigh edema b/l ?Neuro: awake, alert ?GU: +scrotal edema ?  ? ?Additional Objective ?Labs: ?Basic Metabolic Panel: ?Recent Labs  ?Lab 04/27/21 ?2423 04/28/21 ?5361 04/30/21 ?0300  ?NA 132* 131* 133*  ?K 3.7 4.5 4.0  ?CL 98 97* 99  ?CO2 26 22 22   ?GLUCOSE 56* 55* 68*  ?BUN 10 15 12   ?CREATININE 2.97* 3.69* 3.10*  ?CALCIUM 7.6* 8.5* 8.6*  ?PHOS  --   --  2.4*  ? ?Liver Function Tests: ?Recent Labs  ?Lab 04/27/21 ?4431 04/30/21 ?0300  ?AST 18  --   ?ALT 14  --   ?ALKPHOS 76  --   ?BILITOT 1.7*  --   ?PROT 4.7*  --   ?ALBUMIN 1.8* 2.4*  ? ?No results for input(s): LIPASE, AMYLASE in the last 168 hours. ?CBC: ?Recent Labs  ?Lab 04/24/21 ?0420 04/25/21 ?0927 04/26/21 ?0415 04/27/21 ?5400 04/28/21 ?8676 04/30/21 ?0300  ?WBC 16.4* 16.2* 13.1* 12.7* 13.6* 9.9  ?NEUTROABS 14.9*  --   --   --   --   --   ?HGB 10.2* 9.2* 9.0* 8.6* 9.7* 8.9*  ?HCT 30.6* 28.0* 27.5* 25.5* 28.8* 26.9*  ?MCV 104.4* 104.5* 104.6* 103.2* 102.9* 102.7*  ?PLT 151 123* 97* 90* 80* 64*  ? ?Blood Culture ?   ?Component Value Date/Time  ? SDES BLOOD RIGHT FOREARM 04/20/2021 1322  ? SPECREQUEST  04/20/2021 1322  ?  BOTTLES DRAWN AEROBIC AND ANAEROBIC Blood Culture adequate volume  ? CULT  04/20/2021 1322  ?  NO GROWTH 5 DAYS ?Performed at [facility], [place]  [address], [place], [place] [address] ?  ? REPTSTATUS 04/25/2021 FINAL 04/20/2021 1322  ? ? ?Cardiac Enzymes: ?No results for input(s): CKTOTAL, CKMB, CKMBINDEX, TROPONINI in the last 168 hours. ?CBG: ?Recent Labs  ?Lab 04/29/21 ?1543 04/29/21 ?2007 04/30/21 ?0003 04/30/21 ?0354 04/30/21 ?3267  ?GLUCAP 68* 87 80 64* 90  ? ?Iron Studies: No results for input(s): IRON, TIBC, TRANSFERRIN, FERRITIN in the last 72 hours. ?@lablastinr3 @ ?Studies/Results: ?No results found. ?Medications: ? promethazine (PHENERGAN) injection (IM or IVPB) Stopped (04/23/21 0325)  ? ? (feeding supplement) PROSource Plus  30 mL Oral BID BM  ? amiodarone  200 mg Oral Daily  ? apixaban  5 mg Oral BID  ? vitamin C  250 mg Oral BID  ? atorvastatin  10 mg Oral Q M,W,F  ? calcium acetate  667 mg Oral TID WC  ? cholecalciferol  1,000 Units Oral Q M,W,F  ? darbepoetin (ARANESP) injection - DIALYSIS  100 mcg Intravenous Q Wed-HD  ? dextrose  1 Tube Oral STAT  ? feeding supplement  1 Container Oral TID BM  ? fentaNYL (SUBLIMAZE) injection  25 mcg Intravenous Q4H  ? leptospermum manuka  honey  1 application. Topical Daily  ? midodrine  10 mg Oral BID WC  ? multivitamin  1 tablet Oral QHS  ? nutrition supplement (JUVEN)  1 packet Oral BID BM  ? pantoprazole  40 mg Oral Daily  ? zinc sulfate  220 mg Oral Daily  ? ? ?Dialysis Orders:  [name]  on MWF ? 4h 85min  95 kg   1K/2.25 bath  Hep 1000+ 500u/hr  LUE AVG  300/500   ?- prosthetic weighs 1.6 kg ? - Epogen 1000   Units IV/HD   ?  ?Assessment/Plan ?**stump infection:  broad spectrum antibiotics.  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  Complicated by hypotension but now more stable.  Management per vascular ?  ?**ESRD on HD:  HD per usual schedule MWF - heparin per outpt held due to systemic heparin gtt for periop a fib anticoag but now may need to restart as infusion is being held.  HD on MWF schedule, with midodrine support. Plan for HD today then HD again tomorrow w/ DUF. ?  ?**Anemia:  follow, transfuse prn for  hgb <7  On low dose epogen outpt. Aranesp 1105mcg started 3/22 ? ?**CKD MBD: c/w phoslo ?  ?**Nutrition: supplements, per primary ? ?**PAF: on eliquis, mgmt per primary ? ?Reesa Chew ? ?[facility] ?

## 2021-04-30 NOTE — Progress Notes (Signed)
?  Progress Note ? ? ? ?04/30/2021 ?8:28 AM ?8 Days Post-Op ? ?Subjective:  says he feels like he was run over by a greyhound bus and then they back up and ran over him again.  Says he does not want anymore of his leg taken off ? ?Afebrile ? ?Vitals:  ? 04/30/21 0347 04/30/21 0811  ?BP: 114/81 109/77  ?Pulse:  (!) 103  ?Resp: 16 16  ?Temp: 98.2 ?F (36.8 ?C) 97.6 ?F (36.4 ?C)  ?SpO2: 95% 100%  ? ? ?Physical Exam: ?Incisions:  right stump bandaged.  ?Left stump with bulla on medial aspect ? ? ? ? ?CBC ?   ?Component Value Date/Time  ? WBC 9.9 04/30/2021 0300  ? RBC 2.62 (L) 04/30/2021 0300  ? HGB 8.9 (L) 04/30/2021 0300  ? HGB 10.4 (L) 06/02/2016 1107  ? HCT 26.9 (L) 04/30/2021 0300  ? HCT 32.9 (L) 06/02/2016 1107  ? PLT 64 (L) 04/30/2021 0300  ? PLT 389 (H) 06/02/2016 1107  ? MCV 102.7 (H) 04/30/2021 0300  ? MCV 94 06/02/2016 1107  ? MCH 34.0 04/30/2021 0300  ? MCHC 33.1 04/30/2021 0300  ? RDW 19.2 (H) 04/30/2021 0300  ? RDW 16.0 (H) 06/02/2016 1107  ? LYMPHSABS 0.8 04/24/2021 0420  ? LYMPHSABS 2.9 06/02/2016 1107  ? MONOABS 0.6 04/24/2021 0420  ? EOSABS 0.0 04/24/2021 0420  ? EOSABS 0.5 (H) 06/02/2016 1107  ? BASOSABS 0.0 04/24/2021 0420  ? BASOSABS 0.1 06/02/2016 1107  ? ? ?BMET ?   ?Component Value Date/Time  ? NA 133 (L) 04/30/2021 0300  ? NA 142 12/16/2015 1051  ? K 4.0 04/30/2021 0300  ? CL 99 04/30/2021 0300  ? CO2 22 04/30/2021 0300  ? GLUCOSE 68 (L) 04/30/2021 0300  ? BUN 12 04/30/2021 0300  ? BUN 45 (H) 12/16/2015 1051  ? CREATININE 3.10 (H) 04/30/2021 0300  ? CALCIUM 8.6 (L) 04/30/2021 0300  ? GFRNONAA 24 (L) 04/30/2021 0300  ? GFRAA 9 (L) 09/27/2019 0951  ? ? ?INR ?   ?Component Value Date/Time  ? INR 1.4 (H) 02/13/2021 0304  ? ? ?No intake or output data in the 24 hours ending 04/30/21 0828 ? ? ?Assessment/Plan:  51 y.o. 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  8 Days Post-Op ? ?-continue wound care to right stump ?-left stump now worrisome for healing due to edema and new  bulla.  ?-may benefit from dietitian consult as he needs nutrition to help heal.   ?-will check back on Monday.  Please call over the weekend with any concerns.  ? ? ?Leontine Locket, PA-C ?[facility] ?[phone] ?04/30/2021 ?8:28 AM ? ? ? ? ? ? ? ?  ? ?

## 2021-05-01 DIAGNOSIS — T8740 Infection of amputation stump, unspecified extremity: Secondary | ICD-10-CM | POA: Diagnosis not present

## 2021-05-01 DIAGNOSIS — I5042 Chronic combined systolic (congestive) and diastolic (congestive) heart failure: Secondary | ICD-10-CM | POA: Diagnosis not present

## 2021-05-01 DIAGNOSIS — N186 End stage renal disease: Secondary | ICD-10-CM | POA: Diagnosis not present

## 2021-05-01 DIAGNOSIS — E785 Hyperlipidemia, unspecified: Secondary | ICD-10-CM

## 2021-05-01 DIAGNOSIS — I10 Essential (primary) hypertension: Secondary | ICD-10-CM | POA: Diagnosis not present

## 2021-05-01 DIAGNOSIS — E1169 Type 2 diabetes mellitus with other specified complication: Secondary | ICD-10-CM

## 2021-05-01 LAB — CBC
HCT: 25.1 % — ABNORMAL LOW (ref 39.0–52.0)
Hemoglobin: 8.2 g/dL — ABNORMAL LOW (ref 13.0–17.0)
MCH: 34.2 pg — ABNORMAL HIGH (ref 26.0–34.0)
MCHC: 32.7 g/dL (ref 30.0–36.0)
MCV: 104.6 fL — ABNORMAL HIGH (ref 80.0–100.0)
Platelets: 134 10*3/uL — ABNORMAL LOW (ref 150–400)
RBC: 2.4 MIL/uL — ABNORMAL LOW (ref 4.22–5.81)
RDW: 19.5 % — ABNORMAL HIGH (ref 11.5–15.5)
WBC: 9.3 10*3/uL (ref 4.0–10.5)
nRBC: 0.6 % — ABNORMAL HIGH (ref 0.0–0.2)

## 2021-05-01 LAB — GLUCOSE, CAPILLARY
Glucose-Capillary: 59 mg/dL — ABNORMAL LOW (ref 70–99)
Glucose-Capillary: 59 mg/dL — ABNORMAL LOW (ref 70–99)
Glucose-Capillary: 64 mg/dL — ABNORMAL LOW (ref 70–99)
Glucose-Capillary: 71 mg/dL (ref 70–99)
Glucose-Capillary: 76 mg/dL (ref 70–99)
Glucose-Capillary: 80 mg/dL (ref 70–99)
Glucose-Capillary: 98 mg/dL (ref 70–99)

## 2021-05-01 MED ORDER — PROSOURCE PLUS PO LIQD
30.0000 mL | Freq: Two times a day (BID) | ORAL | 0 refills | Status: AC
Start: 2021-05-01 — End: 2021-05-31

## 2021-05-01 MED ORDER — DARBEPOETIN ALFA 150 MCG/0.3ML IJ SOSY: 150.0000 ug | PREFILLED_SYRINGE | INTRAMUSCULAR | Status: DC

## 2021-05-01 MED ORDER — MIDODRINE HCL 10 MG PO TABS: 10.0000 mg | ORAL_TABLET | Freq: Two times a day (BID) | ORAL | 0 refills | Status: AC

## 2021-05-01 MED ORDER — RENA-VITE PO TABS: 1.0000 | ORAL_TABLET | Freq: Every day | ORAL | 0 refills | Status: AC

## 2021-05-01 MED ORDER — ASCORBIC ACID 250 MG PO TABS: 250.0000 mg | ORAL_TABLET | Freq: Two times a day (BID) | ORAL | 0 refills | Status: AC

## 2021-05-01 MED ORDER — JUVEN PO PACK
1.0000 | PACK | Freq: Two times a day (BID) | ORAL | 0 refills | Status: AC
Start: 2021-05-01 — End: 2021-05-31

## 2021-05-01 MED ORDER — OXYCODONE HCL 10 MG PO TABS: 10.0000 mg | ORAL_TABLET | Freq: Four times a day (QID) | ORAL | 0 refills | Status: AC | PRN

## 2021-05-01 NOTE — Progress Notes (Signed)
Hypoglycemic Event ? ?CBG: 64 ? ?Treatment: 4 oz juice/soda ? ?Symptoms: None ? ?Follow-up CBG: [id number] CBG Result:59 ? ?Treatment: 4 oz juice; patient eating breakfast ? ?Follow-up CBG: Time:  9396    CBG Result:71 ? ?Possible Reasons for Event: Inadequate meal intake ? ?Comments/MD notified: Notified Dr. Cathlean Sauer; PRN order in place for [facility], will give prior to HD per Dr. Cathlean Sauer.  ? ? ? ?Kerrie Buffalo B ? ? ?

## 2021-05-01 NOTE — Progress Notes (Signed)
removed 4040mls net fluid no complaints no complications tolerated tx well.  pre bp 110/71 post bp 118/67 pre weight 87.9kg post weight 83.5kg  2 bandages to lua avg no bleeding dressing cdi. ?

## 2021-05-01 NOTE — Progress Notes (Signed)
Received notification that pt's CBG continues to be below 70. Currently 57, primary nurse continues to treat at this time. Primary nurse, Colletta Maryland made aware to call back with blood glucose once within limits.  ?

## 2021-05-01 NOTE — Progress Notes (Signed)
?[facility] ?Progress Note  ? ?Subjective:  patient seen and examined on HD. Tolerating treatment. He feels as if his scrotal edema is getting better ? ?Objective ?Vitals:  ? 05/01/21 1105 05/01/21 1114 05/01/21 1130 05/01/21 1200  ?BP: 110/71 108/66 112/61 101/63  ?Pulse: (!) 102 100 (!) 101 95  ?Resp:      ?Temp: 98.1 ?F (36.7 ?C)     ?TempSrc:      ?SpO2:      ?Weight: 87.9 kg     ?Height:      ? ?Physical Exam ?General appearance: Chronically ill-appearing, lying in bed, no distress ?Resp: Bilateral chest rise with no increased work of breathing ?Cardio: regular rate and rhythm ?GI: soft, non-tender ?Extremities: right bka and left aka, +thigh edema b/l ?Neuro: awake, alert ?GU: +scrotal edema (improving) ?  ? ?Additional Objective ?Labs: ?Basic Metabolic Panel: ?Recent Labs  ?Lab 04/27/21 ?3546 04/28/21 ?5681 04/30/21 ?0300  ?NA 132* 131* 133*  ?K 3.7 4.5 4.0  ?CL 98 97* 99  ?CO2 26 22 22   ?GLUCOSE 56* 55* 68*  ?BUN 10 15 12   ?CREATININE 2.97* 3.69* 3.10*  ?CALCIUM 7.6* 8.5* 8.6*  ?PHOS  --   --  2.4*  ? ?Liver Function Tests: ?Recent Labs  ?Lab 04/27/21 ?2751 04/30/21 ?0300  ?AST 18  --   ?ALT 14  --   ?ALKPHOS 76  --   ?BILITOT 1.7*  --   ?PROT 4.7*  --   ?ALBUMIN 1.8* 2.4*  ? ?No results for input(s): LIPASE, AMYLASE in the last 168 hours. ?CBC: ?Recent Labs  ?Lab 04/26/21 ?0415 04/27/21 ?7001 04/28/21 ?7494 04/30/21 ?0300 05/01/21 ?1008  ?WBC 13.1* 12.7* 13.6* 9.9 9.3  ?HGB 9.0* 8.6* 9.7* 8.9* 8.2*  ?HCT 27.5* 25.5* 28.8* 26.9* 25.1*  ?MCV 104.6* 103.2* 102.9* 102.7* 104.6*  ?PLT 97* 90* 80* 64* 134*  ? ?Blood Culture ?   ?Component Value Date/Time  ? SDES BLOOD RIGHT FOREARM 04/20/2021 1322  ? SPECREQUEST  04/20/2021 1322  ?  BOTTLES DRAWN AEROBIC AND ANAEROBIC Blood Culture adequate volume  ? CULT  04/20/2021 1322  ?  NO GROWTH 5 DAYS ?Performed at [facility], [place] [address], [place], [place] [address] ?  ? REPTSTATUS 04/25/2021 FINAL 04/20/2021 1322  ? ? ?Cardiac Enzymes: ?No  results for input(s): CKTOTAL, CKMB, CKMBINDEX, TROPONINI in the last 168 hours. ?CBG: ?Recent Labs  ?Lab 05/01/21 ?9163 05/01/21 ?8466 05/01/21 ?5993 05/01/21 ?5701 05/01/21 ?7793  ?GLUCAP 76 59* 64* 59* 71  ? ?Iron Studies: No results for input(s): IRON, TIBC, TRANSFERRIN, FERRITIN in the last 72 hours. ?@lablastinr3 @ ?Studies/Results: ?No results found. ?Medications: ? promethazine (PHENERGAN) injection (IM or IVPB) Stopped (04/23/21 0325)  ? ? (feeding supplement) PROSource Plus  30 mL Oral BID BM  ? amiodarone  200 mg Oral Daily  ? apixaban  5 mg Oral BID  ? vitamin C  250 mg Oral BID  ? atorvastatin  10 mg Oral Q M,W,F  ? calcium acetate  667 mg Oral TID WC  ? cholecalciferol  1,000 Units Oral Q M,W,F  ? darbepoetin (ARANESP) injection - DIALYSIS  100 mcg Intravenous Q Wed-HD  ? feeding supplement  1 Container Oral TID BM  ? leptospermum manuka honey  1 application. Topical Daily  ? midodrine  10 mg Oral BID WC  ? multivitamin  1 tablet Oral QHS  ? nutrition supplement (JUVEN)  1 packet Oral BID BM  ? pantoprazole  40 mg Oral Daily  ?  zinc sulfate  220 mg Oral Daily  ? ? ?Dialysis Orders:  [name]  on MWF ? 4h 33min  95 kg   1K/2.25 bath  Hep 1000+ 500u/hr  LUE AVG  300/500   ?- prosthetic weighs 1.6 kg ? - Epogen 1000   Units IV/HD   ?  ?Assessment/Plan ?**stump infection:  broad spectrum antibiotics.  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  Complicated by hypotension but now more stable.  Management per vascular ?  ?**ESRD on HD:  HD on MWF schedule, with midodrine support. Extra HD today for vol removal ?  ?**Anemia:  follow, transfuse prn for hgb <7  On low dose epogen outpt. Aranesp 193mcg started 3/22, increasing to 146mcg ? ?**CKD MBD: c/w phoslo ?  ?**Nutrition: supplements, per primary ? ?**PAF: on eliquis, mgmt per primary ? ?Brendan Campbell Brendan Campbell ? ?[facility] ?

## 2021-05-01 NOTE — Progress Notes (Signed)
Attempted to call report to [facility], no response. ?

## 2021-05-01 NOTE — Discharge Summary (Signed)
?Physician Discharge Summary ?  ?Patient: Brendan Campbell MRN: [id number] DOB: September 13, 1970  ?Admit date:     04/20/2021  ?Discharge date: 05/01/21  ?Discharge Physician: Brendan Campbell  ? ?PCP: Brendan Blitz, MD  ? ?Recommendations at discharge:  ? ? Continue local wound care  ?Follow up with vascular surgery as outpatient ?Continue close monitoring of capillary glucose ( all insulin therapy has been discontinued) ?Patient has completed antibiotic therapy as outpatient  ? ?Discharge Diagnoses: ?Principal Problem: ?  Amputation stump infection ([place]) ?Active Problems: ?  CHF (congestive heart failure) ([place]) ?  ESRD on dialysis [facility]) ?  Type 2 diabetes mellitus with hyperlipidemia ([place]) ?  Essential hypertension ?  Paroxysmal atrial fibrillation (H[facility]) ?  PVD (peripheral vascular disease) ([place]) ?  COPD with acute exacerbation ([place]) ?  Malnutrition of moderate degree ? ?Resolved Problems: ?  * No resolved hospital problems. * ? ?Hospital Course: ?[name] was admitted to the hospital with the working diagnosis of left BKA infected wound.  ? ?51 y.o. 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  ?Patient underwent left leg BKA on 02/16/2021.  In February, he started having pain at the BKA site with some foul-smelling discharge. On 3/14, he was seen by vascular surgery in the office, >concern of deep tissue infection, sent to ED ?On his initial physical examination his blood pressure was 105/83, HR 71, RR 11 and 02 saturation 100% with temp 95.9  ?Lungs clear to auscultation, heart with S1 and S2 present and rhythmic, abdomen soft, positive lower extremity edema, left BKA with ulcerated wound with full smell and thin discharge.  ? ?Na 134, K 5,6 CL 100, bicarbonate 21 glucose 130, bun 33 cr 6,33 ?Wbc 9,7 hgb 11,6 hct 36.4 plt 165  ?Sars covid 19 negative  ? ?Chest  radiograph with cardiomegaly with no infiltrates.  ? ?EKG 69 bpm, right axis deviation, interventricular conduction delay, qtc 550, 1st degree AV block, sinus rhythm with q wave in lead II, III, AvF, V1 to V6, J point elevation V2 and V3 no significant T wave changes.  ? ?Patient was placed on broad spectrum antibiotic therapy.  ? ?3/16, patient underwent revision of left AKA from BKA, debridement right AKA. ?Because of intraoperative hypotension, patient required pressors and was briefly admitted to ICU, subsequently BP stabilized and transferred to  [facility] [address] service, on IV antibiotics, IV heparin, D10 gtt and Dilaudid PCA ?-Getting extra HD for volume removal ?-Heparin briefly held for right arm/elbow hematoma.  ? ?Patient now off PCA pump with good toleration ?Completed antibiotic therapy with good toleration.  ?Wean off D10 IV. ? ?Patient will be transferred to SNF for further physical therapy.  ?  ? ?Assessment and Plan: ?* Amputation stump infection ([place]) ?Patient was placed on antibiotic therapy with good toleration. ?His blood cultures remained with no growth.  ? ?Vascular surgery was consulted ?03/21 underwent left side above the knee amputation, and right sided incision and debridement of previous below the knee amputation.  ? ?Patient was placed on PCA pump for pain control, posteriorly transitioned to oral analgesics.  ?Continue local wound care.  ?Follow up with vascular as outpatient.  ? ?CHF (congestive heart failure) ([place]) ?No clinical signs of decompensation.  ?Continue close blood pressure monitoring for hypotension. ? ?Patient had hypotension post amputation and debridement, required vasopressors.  ? ?Patient underwent ultrafiltration per nephrology recommendations.  ?  Continue outpatient renal replacement therapy for volume control.  ? ? ?  ? ?Malnutrition of moderate degree ?Continue with nutritional supplements.  ?Patient with reactive thrombocytopenia with plt 80.  ? ?COPD with acute exacerbation  ([place]) ?No signs of exacerbation  ?Continue with bronchodilator therapy.  ? ?PVD (peripheral vascular disease) ([place]) ?Continue with aspirin and statin therapy ? ? ?Paroxysmal atrial fibrillation (HCC) ?Patient will continue with amiodarone and anticoagulation with apixaba.  ?Patient has multiple skin ecchymosis, that have been stable. ?For now plan to continue with anticoagulation.  ? ? ?Essential hypertension ?Continue blood pressure monitoring  ?Continue with 10 mg midodrine bid.  ? ?Type 2 diabetes mellitus with hyperlipidemia ([place]) ?Patient had d10 for hypoglycemia. ? ?His glucose has been in the 60 to 90 range, all insulin therapy has been discontinued.  ? ?Continue glucose monitoring ? ?Continue with statin therapy.  ? ?ESRD on dialysis [facility]) ?Hyperkalemia. Hyponatremia.  ? ?Patient with hypervolemia, ultrafiltration with hemodialysis.  ?His discharge hgb is 8,9 and hct 26.9  ?Metabolic bone disease, continue with phoslo  ?Anemia of chronic renal disease continue with EPO.  ? ?Arrangements being made to continue renal replacement therapy as outpatient.  ? ? ? ? ? ? ?  ? ? ?Consultants: vascular surgery  ?Procedures performed: left AKA and right wound debridement.   ?Disposition: Skilled nursing facility ?Diet recommendation:  ?Regular diet ?DISCHARGE MEDICATION: ?Allergies as of 05/01/2021   ? ?   Reactions  ? Tape Other (See Comments)  ? Pulls off the skin  ? Chlorhexidine Gluconate [chlorhexidine] Rash  ? ?  ? ?  ?Medication List  ?  ? ?STOP taking these medications   ? ?carvedilol 3.125 MG tablet ?Commonly known as: Coreg ?  ?Lantus 100 UNIT/ML injection ?Generic drug: insulin glargine ?  ?traMADol 50 MG tablet ?Commonly known as: ULTRAM ?  ? ?  ? ?TAKE these medications   ? ?(feeding supplement) PROSource Plus liquid ?Take 30 mLs by mouth 2 (two) times daily between meals. ?  ?nutrition supplement (JUVEN) Pack ?Take 1 packet by mouth 2 (two) times daily between meals. ?  ?acetaminophen 325 MG  tablet ?Commonly known as: TYLENOL ?Take 2 tablets (650 mg total) by mouth every 6 (six) hours as needed for mild pain, moderate pain or fever. ?  ?albuterol 108 (90 Base) MCG/ACT inhaler ?Commonly known as: VENTOLIN HFA ?Inhale 2 puffs into the lungs every 4 (four) hours as needed for wheezing or shortness of breath. ?  ?amiodarone 200 MG tablet ?Commonly known as: PACERONE ?Take 200 mg by mouth daily. ?  ?apixaban 5 MG Tabs tablet ?Commonly known as: ELIQUIS ?Take 1 tablet (5 mg total) by mouth 2 (two) times daily. ?  ?ascorbic acid 250 MG tablet ?Commonly known as: VITAMIN C ?Take 1 tablet (250 mg total) by mouth 2 (two) times daily. ?  ?aspirin 81 MG chewable tablet ?Chew 81 mg by mouth daily. ?  ?atorvastatin 10 MG tablet ?Commonly known as: LIPITOR ?Take 1 tablet (10 mg total) by mouth every evening. ?What changed: additional instructions ?  ?butalbital-acetaminophen-caffeine 50-325-40 MG tablet ?Commonly known as: FIORICET ?Take 1 tablet by mouth every 6 (six) hours as needed for headache or migraine. ?  ?calcium acetate 667 MG capsule ?Commonly known as: PHOSLO ?Take 1 capsule (667 mg total) by mouth 3 (three) times daily with meals. ?  ?calcium carbonate 500 MG chewable tablet ?Commonly known as: TUMS - dosed in mg elemental calcium ?Chew 4 tablets (800 mg of elemental calcium total)  by mouth 2 (two) times daily. ?  ?midodrine 10 MG tablet ?Commonly known as: PROAMATINE ?Take 1 tablet (10 mg total) by mouth 2 (two) times daily with a meal. ?  ?multivitamin Tabs tablet ?Take 1 tablet by mouth at bedtime. ?  ?Oxycodone HCl 10 MG Tabs ?Take 1 tablet (10 mg total) by mouth every 6 (six) hours as needed for severe pain. ?What changed:  ?medication strength ?how much to take ?  ?pantoprazole 40 MG tablet ?Commonly known as: PROTONIX ?Take 40 mg by mouth daily. ?  ?tiZANidine 4 MG tablet ?Commonly known as: ZANAFLEX ?Take 4 mg by mouth daily as needed. ?  ?Vitamin D3 25 MCG (1000 UT) Caps ?Take 1 capsule by mouth  See admin instructions. Take before Dialysis on Monday Wednesday and Fridays ?  ? ?  ? ?  ?  ? ? ?  ?Discharge Care Instructions  ?(From admission, onward)  ?  ? ? ?  ? ?  Start     Ordered  ? 05/01/21 0000  Discharg

## 2021-05-01 NOTE — Progress Notes (Signed)
Report given to [facility], receiving nurse at [facility]. All questions answered. Patient is awaiting transport via [place]. ?

## 2021-05-01 NOTE — TOC Transition Note (Signed)
Transition of Care (TOC) - CM/SW Discharge Note ? ? ?Patient Details  ?Name: Brendan Campbell ?MRN: [id number] ?Date of Birth: [birth year] ? ?Transition of Care (TOC) CM/SW Contact:  ?Bary Castilla, LCSW ?Phone Number:[phone] ?05/01/2021, 11:07 AM ? ? ?Clinical Narrative:    ?Patient will DC to:?[facility] ?Anticipated DC date:?05/01/2021 ?Family notified:?Liz ?Transport by: Corey Harold ?  ?Per MD patient ready for DC to [facility] RN, patient, patient's family, and facility notified of DC. Discharge Summary sent to facility. RN given number for report  [phone] ask for [facility]. DC packet on chart. Ambulance transport requested for patient.  ? ?CSW signing off. ?  ?Vallery Ridge, LCSW ?[phone]  ? ? ?Final next level of care: [place] ?Barriers to Discharge: No Barriers Identified ? ? ?Patient Goals and CMS Choice ?Patient states their goals for this hospitalization and ongoing recovery are:: SNF ?CMS Medicare.gov Compare Post Acute Care list provided to:: Patient ?Choice offered to / list presented to : Patient ? ?Discharge Placement ?  ?           ?Patient chooses bed at: [facility] ?Patient to be transferred to facility by: [facility] ?Name of family member notified: Kathlee Nations ?Patient and family notified of of transfer: 05/01/21 ? ?Discharge Plan and Services ?In-house Referral: Clinical Social Work ?  ?           ?  ?  ?  ?  ?  ?  ?  ?  ?  ?  ? ?Social Determinants of Health (SDOH) Interventions ?  ? ? ?Readmission Risk Interventions ? ?  07/21/2020  ? 11:11 AM  ?Readmission Risk Prevention Plan  ?Transportation Screening Complete  ?PCP or Specialist Appt within 3-5 Days Complete  ?[place] or Home Care Consult Complete  ?Social Work Consult for [place] Planning/Counseling Complete  ?Palliative Care Screening Not Applicable  ?Medication Review [employer] photographer) Complete  ? ? ? ? ? ?

## 2021-05-01 NOTE — Progress Notes (Signed)
removed 4055mls net fluid no complaints no complications tolerated tx well.  pre bp 110/71 post bp 118/67 pre weight 87.9kg post weight 83.5kg  2 bandages to lua avg no bleeding dressing cdi. ?

## 2021-05-02 LAB — PTH, INTACT AND CALCIUM
Calcium, Total (PTH): 7.7 mg/dL — ABNORMAL LOW (ref 8.7–10.2)
PTH: 74 pg/mL — ABNORMAL HIGH (ref 15–65)

## 2021-05-02 LAB — ZINC: Zinc: 91 ug/dL (ref 44–115)

## 2021-05-04 LAB — VITAMIN C: Vitamin C: 0.3 mg/dL — ABNORMAL LOW (ref 0.4–2.0)

## 2021-05-09 LAB — VITAMIN A: Vitamin A (Retinoic Acid): 2.5 ug/dL — ABNORMAL LOW (ref 20.1–62.0)

## 2021-05-14 ENCOUNTER — Ambulatory Visit: Payer: Medicaid Other | Admitting: Vascular Surgery

## 2021-05-15 ENCOUNTER — Emergency Department (HOSPITAL_COMMUNITY): Payer: Medicaid Other

## 2021-05-15 ENCOUNTER — Inpatient Hospital Stay (HOSPITAL_COMMUNITY)
Admission: EM | Admit: 2021-05-15 | Discharge: 2021-06-07 | DRG: 564 | Disposition: E | Payer: Medicaid Other | Source: Ambulatory Visit | Attending: Internal Medicine | Admitting: Internal Medicine

## 2021-05-15 ENCOUNTER — Encounter (HOSPITAL_COMMUNITY): Payer: Self-pay | Admitting: Internal Medicine

## 2021-05-15 DIAGNOSIS — F32A Depression, unspecified: Secondary | ICD-10-CM | POA: Diagnosis present

## 2021-05-15 DIAGNOSIS — Z89511 Acquired absence of right leg below knee: Secondary | ICD-10-CM

## 2021-05-15 DIAGNOSIS — N186 End stage renal disease: Secondary | ICD-10-CM

## 2021-05-15 DIAGNOSIS — D631 Anemia in chronic kidney disease: Secondary | ICD-10-CM | POA: Diagnosis present

## 2021-05-15 DIAGNOSIS — S31000A Unspecified open wound of lower back and pelvis without penetration into retroperitoneum, initial encounter: Secondary | ICD-10-CM

## 2021-05-15 DIAGNOSIS — E785 Hyperlipidemia, unspecified: Secondary | ICD-10-CM | POA: Diagnosis present

## 2021-05-15 DIAGNOSIS — E1122 Type 2 diabetes mellitus with diabetic chronic kidney disease: Secondary | ICD-10-CM | POA: Diagnosis present

## 2021-05-15 DIAGNOSIS — R4182 Altered mental status, unspecified: Principal | ICD-10-CM

## 2021-05-15 DIAGNOSIS — Y835 Amputation of limb(s) as the cause of abnormal reaction of the patient, or of later complication, without mention of misadventure at the time of the procedure: Secondary | ICD-10-CM | POA: Diagnosis present

## 2021-05-15 DIAGNOSIS — I132 Hypertensive heart and chronic kidney disease with heart failure and with stage 5 chronic kidney disease, or end stage renal disease: Secondary | ICD-10-CM | POA: Diagnosis present

## 2021-05-15 DIAGNOSIS — Z992 Dependence on renal dialysis: Secondary | ICD-10-CM

## 2021-05-15 DIAGNOSIS — E11649 Type 2 diabetes mellitus with hypoglycemia without coma: Secondary | ICD-10-CM | POA: Diagnosis present

## 2021-05-15 DIAGNOSIS — R627 Adult failure to thrive: Secondary | ICD-10-CM | POA: Diagnosis present

## 2021-05-15 DIAGNOSIS — T8740 Infection of amputation stump, unspecified extremity: Secondary | ICD-10-CM | POA: Diagnosis present

## 2021-05-15 DIAGNOSIS — G9341 Metabolic encephalopathy: Secondary | ICD-10-CM | POA: Diagnosis present

## 2021-05-15 DIAGNOSIS — E1152 Type 2 diabetes mellitus with diabetic peripheral angiopathy with gangrene: Secondary | ICD-10-CM | POA: Diagnosis present

## 2021-05-15 DIAGNOSIS — Z7189 Other specified counseling: Secondary | ICD-10-CM

## 2021-05-15 DIAGNOSIS — I10 Essential (primary) hypertension: Secondary | ICD-10-CM | POA: Diagnosis not present

## 2021-05-15 DIAGNOSIS — Z20822 Contact with and (suspected) exposure to covid-19: Secondary | ICD-10-CM | POA: Diagnosis present

## 2021-05-15 DIAGNOSIS — E1165 Type 2 diabetes mellitus with hyperglycemia: Secondary | ICD-10-CM | POA: Diagnosis not present

## 2021-05-15 DIAGNOSIS — A4159 Other Gram-negative sepsis: Secondary | ICD-10-CM | POA: Diagnosis present

## 2021-05-15 DIAGNOSIS — L89220 Pressure ulcer of left hip, unstageable: Secondary | ICD-10-CM | POA: Diagnosis present

## 2021-05-15 DIAGNOSIS — I5022 Chronic systolic (congestive) heart failure: Secondary | ICD-10-CM | POA: Diagnosis present

## 2021-05-15 DIAGNOSIS — E43 Unspecified severe protein-calorie malnutrition: Secondary | ICD-10-CM | POA: Diagnosis present

## 2021-05-15 DIAGNOSIS — A498 Other bacterial infections of unspecified site: Secondary | ICD-10-CM

## 2021-05-15 DIAGNOSIS — F419 Anxiety disorder, unspecified: Secondary | ICD-10-CM | POA: Diagnosis present

## 2021-05-15 DIAGNOSIS — I482 Chronic atrial fibrillation, unspecified: Secondary | ICD-10-CM | POA: Diagnosis present

## 2021-05-15 DIAGNOSIS — L89150 Pressure ulcer of sacral region, unstageable: Secondary | ICD-10-CM | POA: Diagnosis present

## 2021-05-15 DIAGNOSIS — Z91158 Patient's noncompliance with renal dialysis for other reason: Secondary | ICD-10-CM

## 2021-05-15 DIAGNOSIS — Z515 Encounter for palliative care: Secondary | ICD-10-CM | POA: Diagnosis not present

## 2021-05-15 DIAGNOSIS — Z7982 Long term (current) use of aspirin: Secondary | ICD-10-CM

## 2021-05-15 DIAGNOSIS — G9340 Encephalopathy, unspecified: Secondary | ICD-10-CM | POA: Diagnosis present

## 2021-05-15 DIAGNOSIS — F4310 Post-traumatic stress disorder, unspecified: Secondary | ICD-10-CM | POA: Diagnosis present

## 2021-05-15 DIAGNOSIS — Z87891 Personal history of nicotine dependence: Secondary | ICD-10-CM

## 2021-05-15 DIAGNOSIS — Z66 Do not resuscitate: Secondary | ICD-10-CM | POA: Diagnosis present

## 2021-05-15 DIAGNOSIS — E782 Mixed hyperlipidemia: Secondary | ICD-10-CM | POA: Diagnosis not present

## 2021-05-15 DIAGNOSIS — Z794 Long term (current) use of insulin: Secondary | ICD-10-CM | POA: Diagnosis not present

## 2021-05-15 DIAGNOSIS — Z8249 Family history of ischemic heart disease and other diseases of the circulatory system: Secondary | ICD-10-CM

## 2021-05-15 DIAGNOSIS — Z1619 Resistance to other specified beta lactam antibiotics: Secondary | ICD-10-CM | POA: Diagnosis present

## 2021-05-15 DIAGNOSIS — E1151 Type 2 diabetes mellitus with diabetic peripheral angiopathy without gangrene: Secondary | ICD-10-CM | POA: Diagnosis present

## 2021-05-15 DIAGNOSIS — I9789 Other postprocedural complications and disorders of the circulatory system, not elsewhere classified: Secondary | ICD-10-CM

## 2021-05-15 DIAGNOSIS — Z801 Family history of malignant neoplasm of trachea, bronchus and lung: Secondary | ICD-10-CM

## 2021-05-15 DIAGNOSIS — I5042 Chronic combined systolic (congestive) and diastolic (congestive) heart failure: Secondary | ICD-10-CM | POA: Diagnosis not present

## 2021-05-15 DIAGNOSIS — Z79899 Other long term (current) drug therapy: Secondary | ICD-10-CM

## 2021-05-15 DIAGNOSIS — Z1613 Resistance to carbapenem: Secondary | ICD-10-CM

## 2021-05-15 DIAGNOSIS — I96 Gangrene, not elsewhere classified: Secondary | ICD-10-CM | POA: Diagnosis present

## 2021-05-15 DIAGNOSIS — Z833 Family history of diabetes mellitus: Secondary | ICD-10-CM

## 2021-05-15 DIAGNOSIS — Z6825 Body mass index (BMI) 25.0-25.9, adult: Secondary | ICD-10-CM

## 2021-05-15 DIAGNOSIS — Z7901 Long term (current) use of anticoagulants: Secondary | ICD-10-CM

## 2021-05-15 DIAGNOSIS — A4150 Gram-negative sepsis, unspecified: Secondary | ICD-10-CM

## 2021-05-15 DIAGNOSIS — I509 Heart failure, unspecified: Secondary | ICD-10-CM

## 2021-05-15 DIAGNOSIS — T8744 Infection of amputation stump, left lower extremity: Secondary | ICD-10-CM | POA: Diagnosis not present

## 2021-05-15 LAB — COMPREHENSIVE METABOLIC PANEL
ALT: 17 U/L (ref 0–44)
AST: 25 U/L (ref 15–41)
Albumin: 1.7 g/dL — ABNORMAL LOW (ref 3.5–5.0)
Alkaline Phosphatase: 155 U/L — ABNORMAL HIGH (ref 38–126)
Anion gap: 11 (ref 5–15)
BUN: 12 mg/dL (ref 6–20)
CO2: 26 mmol/L (ref 22–32)
Calcium: 7.8 mg/dL — ABNORMAL LOW (ref 8.9–10.3)
Chloride: 100 mmol/L (ref 98–111)
Creatinine, Ser: 2.53 mg/dL — ABNORMAL HIGH (ref 0.61–1.24)
GFR, Estimated: 30 mL/min — ABNORMAL LOW (ref >60)
Glucose, Bld: 46 mg/dL — ABNORMAL LOW (ref 70–99)
Potassium: 3.7 mmol/L (ref 3.5–5.1)
Sodium: 137 mmol/L (ref 135–145)
Total Bilirubin: 2.3 mg/dL — ABNORMAL HIGH (ref 0.3–1.2)
Total Protein: 4.6 g/dL — ABNORMAL LOW (ref 6.5–8.1)

## 2021-05-15 LAB — CBG MONITORING, ED
Glucose-Capillary: 51 mg/dL — ABNORMAL LOW (ref 70–99)
Glucose-Capillary: 47 mg/dL — ABNORMAL LOW (ref 70–99)
Glucose-Capillary: 111 mg/dL — ABNORMAL HIGH (ref 70–99)

## 2021-05-15 LAB — CBC
HCT: 23.4 % — ABNORMAL LOW (ref 39.0–52.0)
Hemoglobin: 8.3 g/dL — ABNORMAL LOW (ref 13.0–17.0)
MCH: 36.2 pg — ABNORMAL HIGH (ref 26.0–34.0)
MCHC: 35.5 g/dL (ref 30.0–36.0)
MCV: 102.2 fL — ABNORMAL HIGH (ref 80.0–100.0)
Platelets: 242 10*3/uL (ref 150–400)
RBC: 2.29 MIL/uL — ABNORMAL LOW (ref 4.22–5.81)
RDW: 20.8 % — ABNORMAL HIGH (ref 11.5–15.5)
WBC: 27.9 10*3/uL — ABNORMAL HIGH (ref 4.0–10.5)
nRBC: 0 % (ref 0.0–0.2)

## 2021-05-15 LAB — CREATININE, SERUM
Creatinine, Ser: 2.81 mg/dL — ABNORMAL HIGH (ref 0.61–1.24)
GFR, Estimated: 27 mL/min — ABNORMAL LOW (ref >60)

## 2021-05-15 LAB — RESP PANEL BY RT-PCR (FLU A&B, COVID) ARPGX2
Influenza A by PCR: NEGATIVE
Influenza B by PCR: NEGATIVE
SARS Coronavirus 2 by RT PCR: NEGATIVE

## 2021-05-15 LAB — LACTIC ACID, PLASMA: Lactic Acid, Venous: 1.7 mmol/L (ref 0.5–1.9)

## 2021-05-15 LAB — PROCALCITONIN: Procalcitonin: 7 ng/mL

## 2021-05-15 LAB — AMMONIA: Ammonia: 107 umol/L — ABNORMAL HIGH (ref 9–35)

## 2021-05-15 LAB — TYPE AND SCREEN
ABO/RH(D): O POS
Antibody Screen: NEGATIVE

## 2021-05-15 IMAGING — CT CT HEAD W/O CM
4 series · 17 of 47 positions shown, 19 images · non-contrast
Comparison: None.

CLINICAL DATA: Mental status change



[Series 2: head without · axial · non-contrast · 0.47mm/px · z∈[-126,-1]mm · 7 of 35 slices shown, 9 images]
[im 5/35  brain]
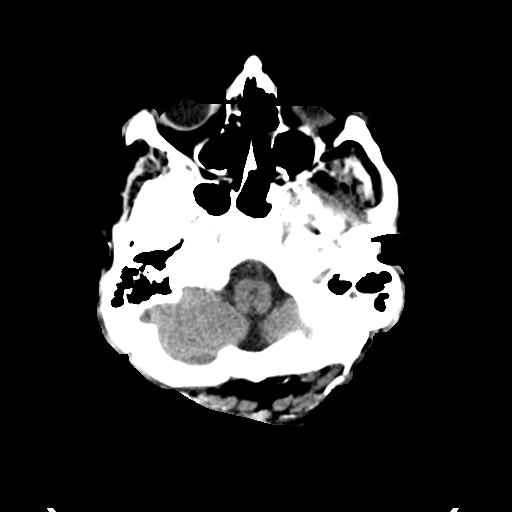
[im 5/35  bone]
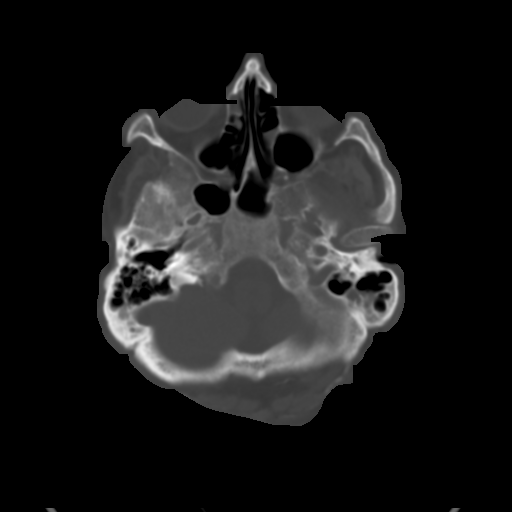
[im 9/35  brain]
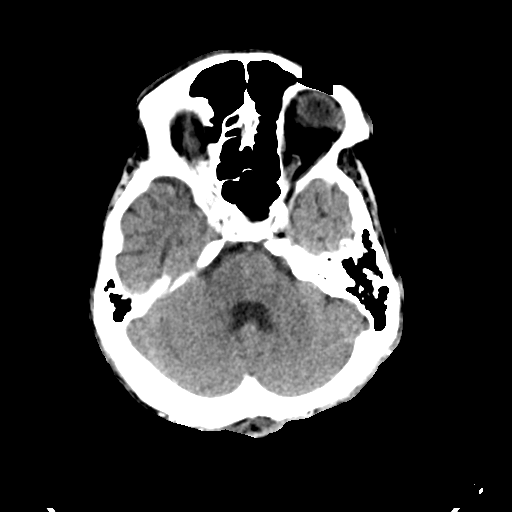
[im 13/35  brain]
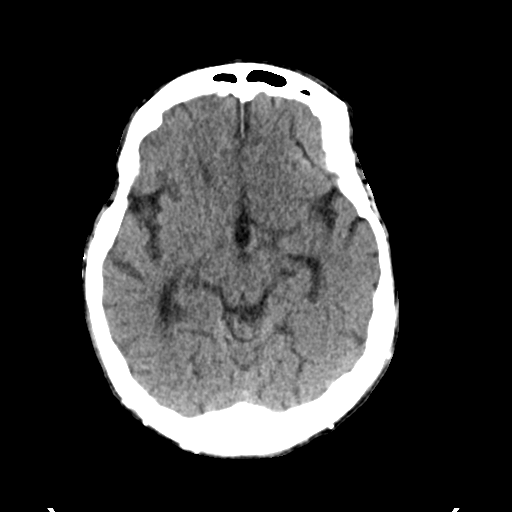
[im 18/35  brain]
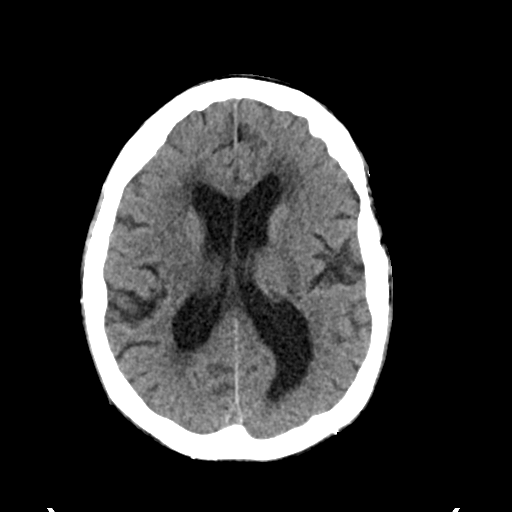
[im 22/35  brain]
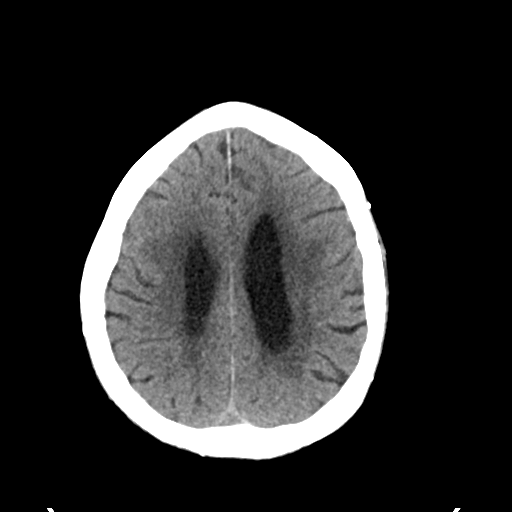
[im 22/35  bone]
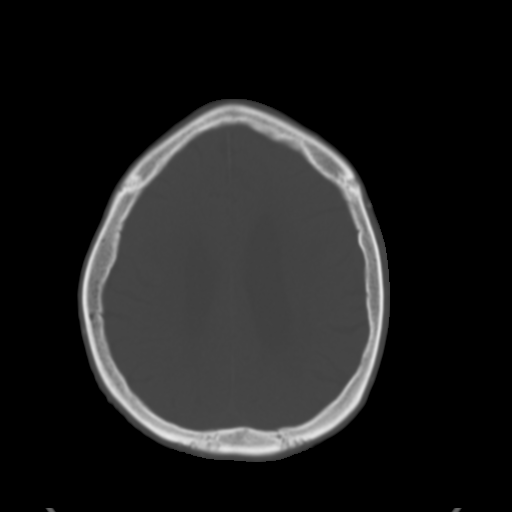
[im 26/35  brain]
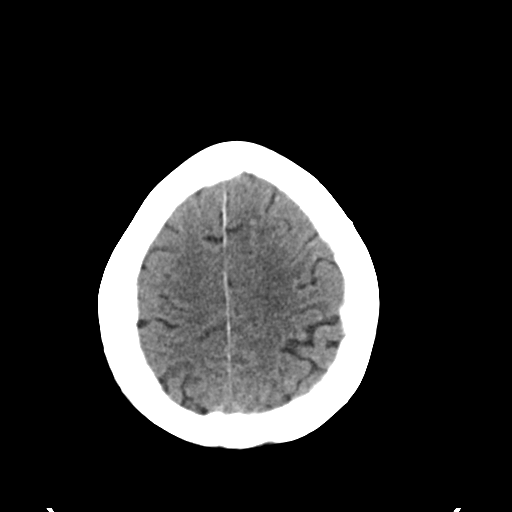
[im 30/35  brain]
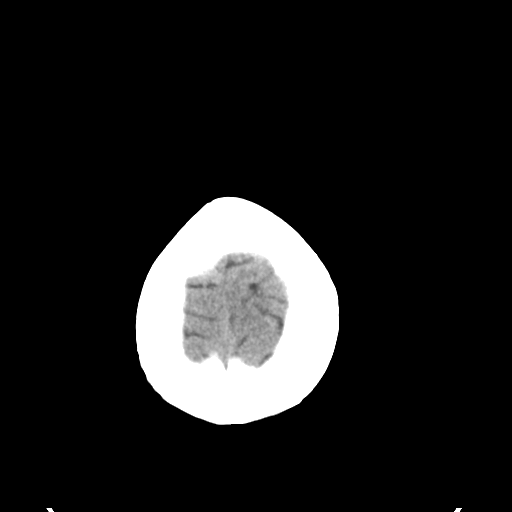

[Series 3: head bone · axial · 0.47mm/px · z∈[-130,-70]mm · 4 of 86 slices shown]
[im 9/86  bone]
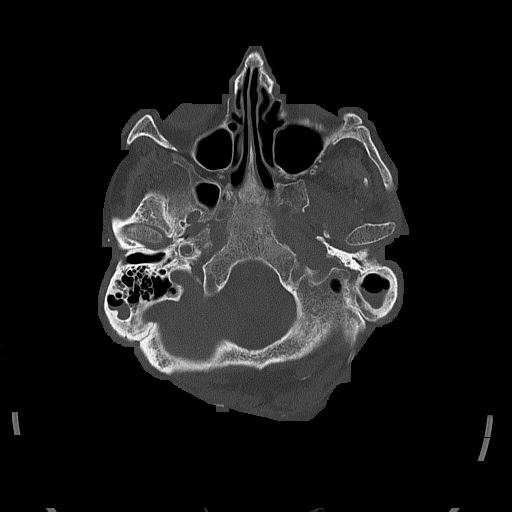
[im 18/86  bone]
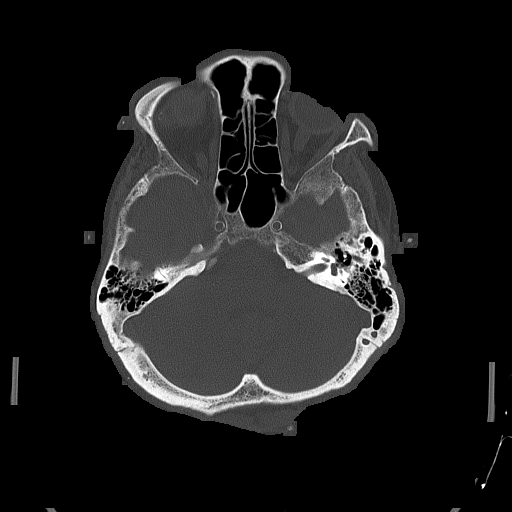
[im 26/86  bone]
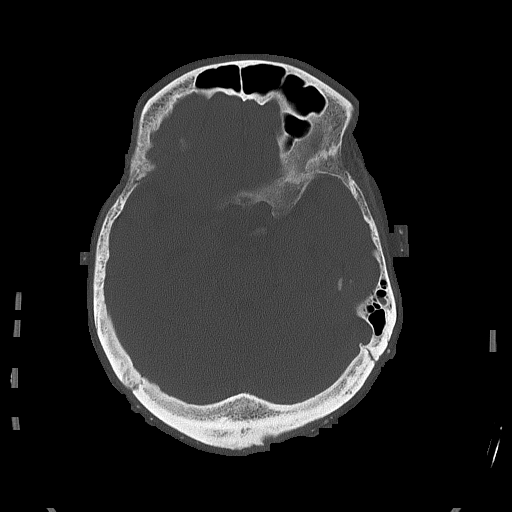
[im 39/86  bone]
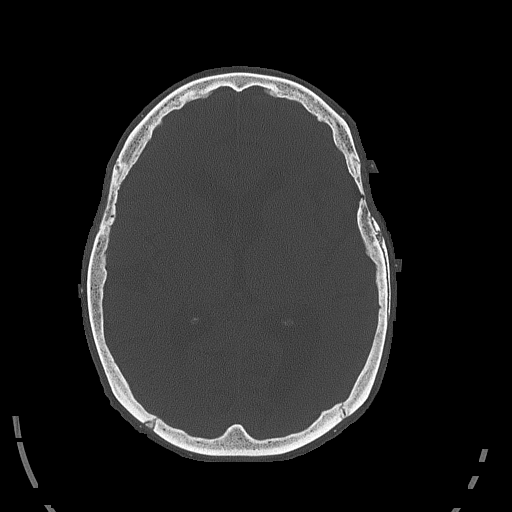

[Series 4: head without cor · coronal · non-contrast · 0.35mm/px · 3 of 67 slices shown]
[im 23/67  brain]
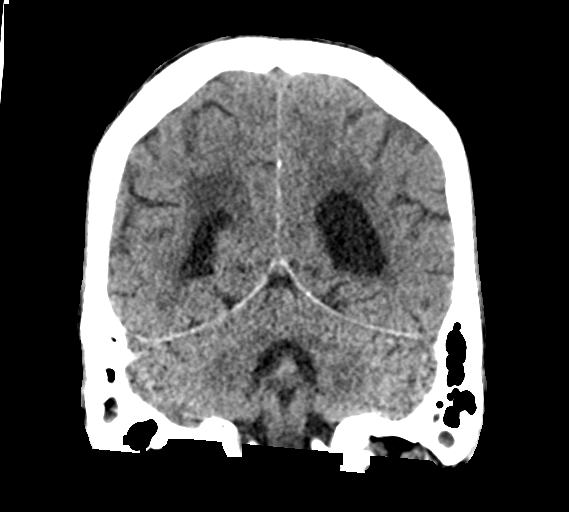
[im 30/67  brain]
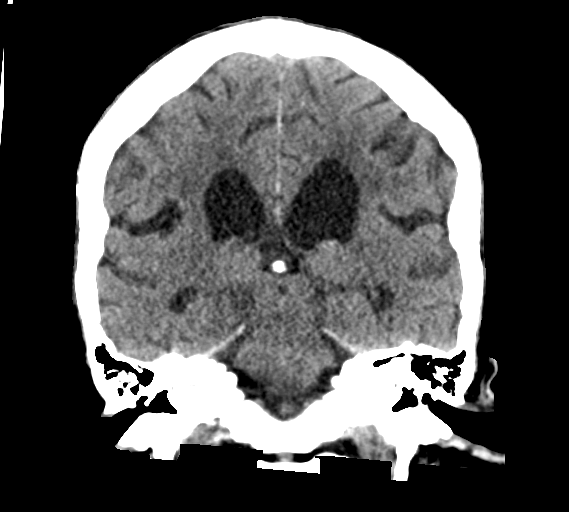
[im 37/67  brain]
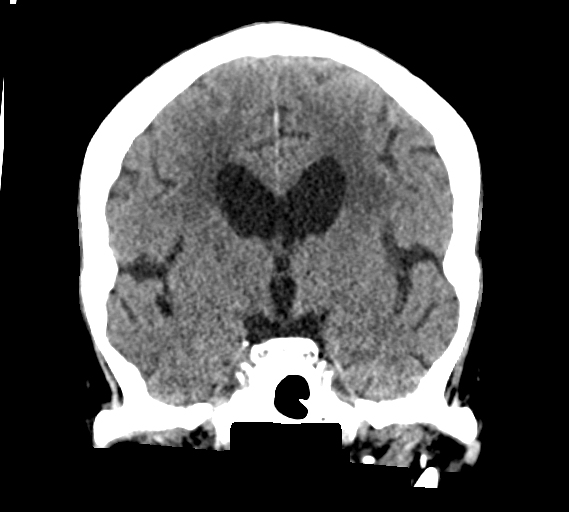

[Series 5: head without sag · sagittal · non-contrast · 0.38mm/px · 3 of 67 slices shown]
[im 23/67  brain]
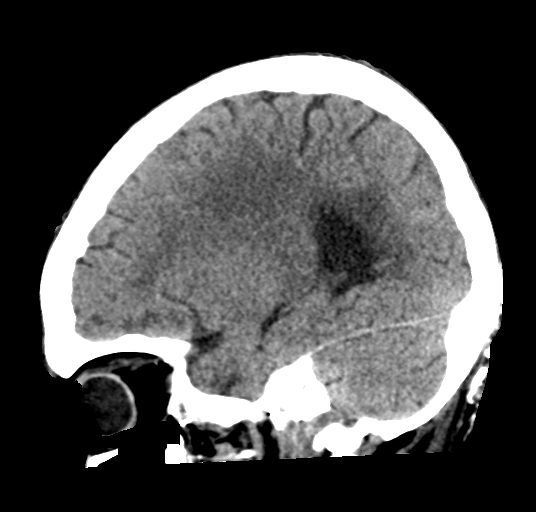
[im 34/67  brain]
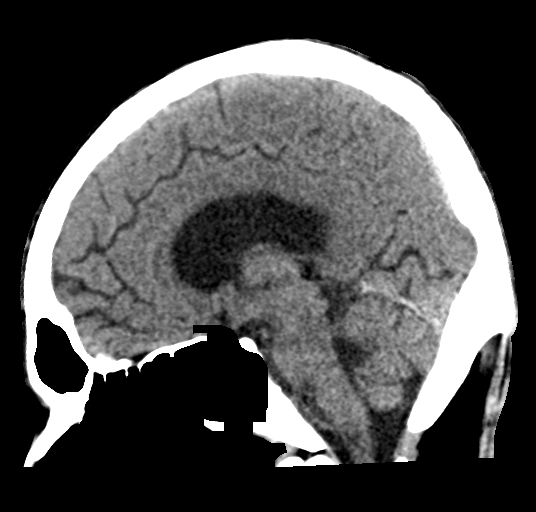
[im 45/67  brain]
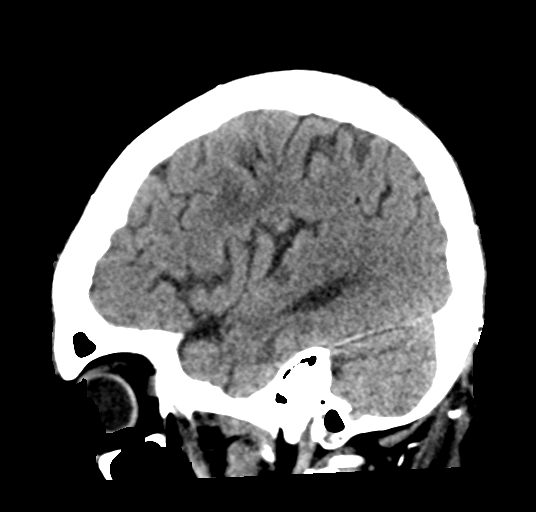

[17 of 47 positions shown; findings below may reference images not displayed]

FINDINGS: Brain: No evidence of acute infarction, hemorrhage, hydrocephalus,
extra-axial collection or mass lesion/mass effect. Periventricular
and deep white matter hypodensity.

Vascular: No hyperdense vessel or unexpected calcification.

Skull: Normal. Negative for fracture or focal lesion.

Sinuses/Orbits: No acute finding.

Other: None.
IMPRESSION: No acute intracranial pathology. Small-vessel white matter disease.

## 2021-05-15 IMAGING — DX DG CHEST 1V PORT
1 series · 1 of 1 positions shown · non-contrast
Comparison: [DATE]

CLINICAL DATA: Chest pain.

EXAM:
PORTABLE CHEST 1 VIEW

[chest ap]
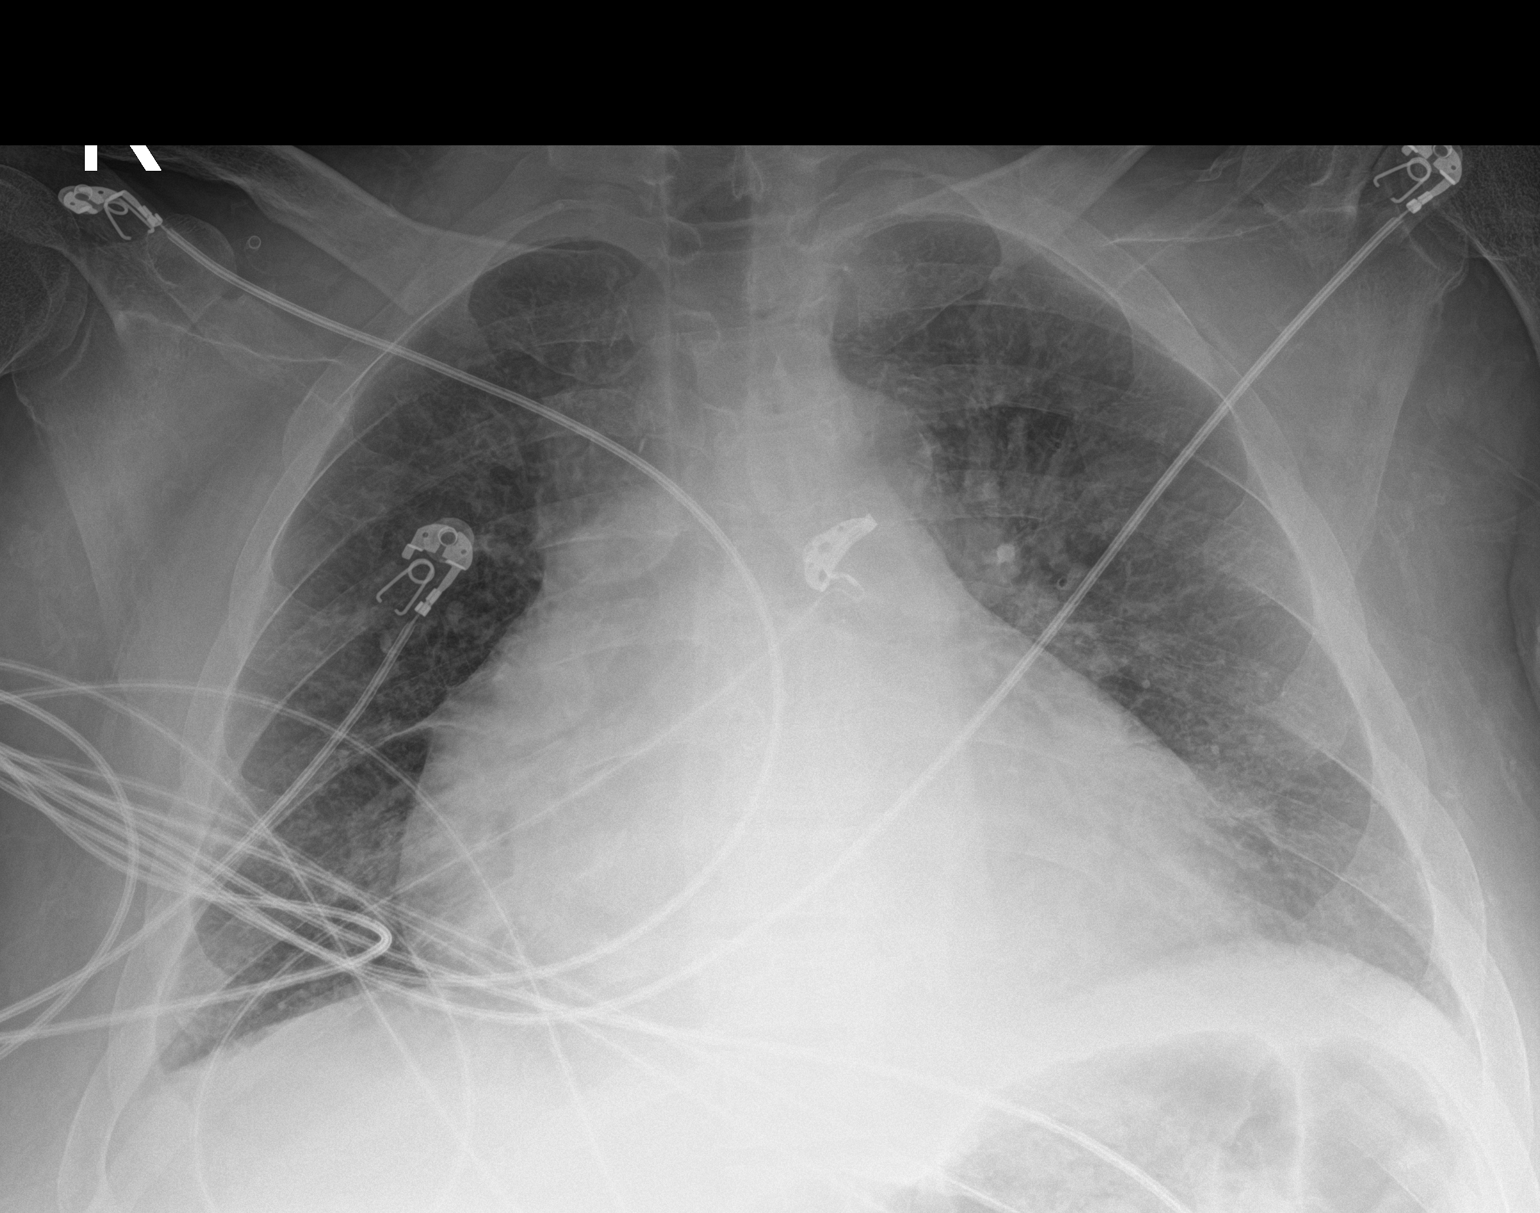

[1 of 1 positions shown; findings below may reference images not displayed]

FINDINGS: The cardio pericardial silhouette is enlarged. Diffuse interstitial
opacity suggests edema. No focal consolidation or substantial
pleural effusion. Bones are diffusely demineralized. Telemetry leads
overlie the chest.
IMPRESSION: Enlarged cardiopericardial silhouette with likely interstitial
pulmonary edema.

## 2021-05-15 MED ORDER — DEXTROSE 50 % IV SOLN
1.0000 | Freq: Once | INTRAVENOUS | Status: AC
  Administered 2021-05-15: 50 mL via INTRAVENOUS

## 2021-05-15 MED ORDER — OXYCODONE HCL 5 MG PO TABS
10.0000 mg | ORAL_TABLET | Freq: Four times a day (QID) | ORAL | Status: DC | PRN
  Administered 2021-05-15: 10 mg via ORAL
  Filled 2021-05-15: qty 2

## 2021-05-15 MED ORDER — ASPIRIN 81 MG PO CHEW
81.0000 mg | CHEWABLE_TABLET | Freq: Every day | ORAL | Status: DC
  Administered 2021-05-15 – 2021-05-16 (×2): 81 mg via ORAL
  Filled 2021-05-15 (×2): qty 1

## 2021-05-15 MED ORDER — DIGOXIN 0.25 MG/ML IJ SOLN
0.5000 mg | Freq: Once | INTRAMUSCULAR | Status: AC
  Administered 2021-05-16: 0.5 mg via INTRAVENOUS
  Filled 2021-05-15: qty 2

## 2021-05-15 MED ORDER — VANCOMYCIN HCL 1750 MG/350ML IV SOLN
1750.0000 mg | Freq: Once | INTRAVENOUS | Status: AC
  Administered 2021-05-15: 1750 mg via INTRAVENOUS
  Filled 2021-05-15: qty 350

## 2021-05-15 MED ORDER — GLUCAGON HCL RDNA (DIAGNOSTIC) 1 MG IJ SOLR
2.0000 mg | Freq: Once | INTRAMUSCULAR | Status: AC
  Administered 2021-05-15: 2 mg via INTRAVENOUS
  Filled 2021-05-15: qty 2

## 2021-05-15 MED ORDER — APIXABAN 5 MG PO TABS: 5.0000 mg | ORAL_TABLET | Freq: Two times a day (BID) | ORAL | Status: DC

## 2021-05-15 MED ORDER — PROSOURCE PLUS PO LIQD: 30.0000 mL | Freq: Two times a day (BID) | ORAL | Status: DC

## 2021-05-15 MED ORDER — AMIODARONE HCL 200 MG PO TABS
200.0000 mg | ORAL_TABLET | Freq: Every day | ORAL | Status: DC
  Administered 2021-05-16: 200 mg via ORAL
  Filled 2021-05-15: qty 1

## 2021-05-15 MED ORDER — PANTOPRAZOLE SODIUM 40 MG PO TBEC
40.0000 mg | DELAYED_RELEASE_TABLET | Freq: Every day | ORAL | Status: DC
  Administered 2021-05-16: 40 mg via ORAL
  Filled 2021-05-15: qty 1

## 2021-05-15 MED ORDER — ATORVASTATIN CALCIUM 10 MG PO TABS: 10.0000 mg | ORAL_TABLET | Freq: Every evening | ORAL | Status: DC

## 2021-05-15 MED ORDER — ACETAMINOPHEN 325 MG PO TABS: 650.0000 mg | ORAL_TABLET | Freq: Four times a day (QID) | ORAL | Status: DC | PRN

## 2021-05-15 MED ORDER — JUVEN PO PACK: 1.0000 | PACK | Freq: Two times a day (BID) | ORAL | Status: DC

## 2021-05-15 MED ORDER — METRONIDAZOLE 500 MG/100ML IV SOLN
500.0000 mg | Freq: Once | INTRAVENOUS | Status: AC
  Administered 2021-05-15: 500 mg via INTRAVENOUS
  Filled 2021-05-15: qty 100

## 2021-05-15 MED ORDER — DIGOXIN 0.25 MG/ML IJ SOLN
0.2500 mg | Freq: Once | INTRAMUSCULAR | Status: AC
  Administered 2021-05-16: 0.25 mg via INTRAVENOUS
  Filled 2021-05-15 (×2): qty 1

## 2021-05-15 MED ORDER — METRONIDAZOLE 500 MG/100ML IV SOLN
500.0000 mg | Freq: Two times a day (BID) | INTRAVENOUS | Status: DC
  Administered 2021-05-16: 500 mg via INTRAVENOUS
  Filled 2021-05-15: qty 100

## 2021-05-15 MED ORDER — LACTATED RINGERS IV BOLUS
500.0000 mL | Freq: Once | INTRAVENOUS | Status: AC
  Administered 2021-05-16: 500 mL via INTRAVENOUS

## 2021-05-15 MED ORDER — SODIUM CHLORIDE 0.9 % IV SOLN
2.0000 g | Freq: Once | INTRAVENOUS | Status: AC
  Administered 2021-05-15: 2 g via INTRAVENOUS
  Filled 2021-05-15: qty 12.5

## 2021-05-15 MED ORDER — CALCIUM ACETATE (PHOS BINDER) 667 MG PO CAPS: 667.0000 mg | ORAL_CAPSULE | Freq: Three times a day (TID) | ORAL | Status: DC

## 2021-05-15 MED ORDER — MIDODRINE HCL 5 MG PO TABS
10.0000 mg | ORAL_TABLET | Freq: Two times a day (BID) | ORAL | Status: DC
  Administered 2021-05-16: 10 mg via ORAL
  Filled 2021-05-15: qty 2

## 2021-05-15 MED ORDER — GLUCAGON HCL RDNA (DIAGNOSTIC) 1 MG IJ SOLR
1.0000 mg | Freq: Once | INTRAMUSCULAR | Status: DC
Start: 2021-05-15 — End: 2021-05-15

## 2021-05-15 MED ORDER — BUTALBITAL-APAP-CAFFEINE 50-325-40 MG PO TABS: 1.0000 | ORAL_TABLET | Freq: Four times a day (QID) | ORAL | Status: DC | PRN

## 2021-05-15 MED ORDER — DEXTROSE 50 % IV SOLN
1.0000 | Freq: Once | INTRAVENOUS | Status: DC
  Filled 2021-05-15: qty 50

## 2021-05-15 MED ORDER — DIGOXIN 0.25 MG/ML IJ SOLN: 0.2500 mg | Freq: Once | INTRAMUSCULAR | Status: DC

## 2021-05-15 MED ORDER — HEPARIN SODIUM (PORCINE) 5000 UNIT/ML IJ SOLN
5000.0000 [IU] | Freq: Three times a day (TID) | INTRAMUSCULAR | Status: DC
  Administered 2021-05-15 – 2021-05-17 (×5): 5000 [IU] via SUBCUTANEOUS
  Filled 2021-05-15 (×5): qty 1

## 2021-05-15 MED ORDER — ALBUTEROL SULFATE (2.5 MG/3ML) 0.083% IN NEBU: 2.5000 mg | INHALATION_SOLUTION | RESPIRATORY_TRACT | Status: DC | PRN

## 2021-05-15 MED ORDER — ALBUTEROL SULFATE HFA 108 (90 BASE) MCG/ACT IN AERS: 2.0000 | INHALATION_SPRAY | RESPIRATORY_TRACT | Status: DC | PRN

## 2021-05-15 MED ORDER — VANCOMYCIN HCL IN DEXTROSE 1-5 GM/200ML-% IV SOLN: 1000.0000 mg | Freq: Once | INTRAVENOUS | Status: DC

## 2021-05-15 NOTE — Assessment & Plan Note (Addendum)
Patient on HD - could not complete treatment on day of admission secondary to acute decompensation.  ?-Patient seen by nephrology felt to have a poor prognosis.  ?-Patient seen by palliative care and has been transitioned to full comfort measures.   ?-Patient subsequently died at 30 hrs. on 06/03/2021 ?

## 2021-05-15 NOTE — ED Provider Notes (Addendum)
[facility] EMERGENCY DEPARTMENT Provider Note   CSN: [id number] Arrival date & time: 05/15/21  1413     History  Chief Complaint  Patient presents with  . Altered Mental Status    Brendan Campbell is a 51 y.o. male.  51 year old male male who presents ultimately status at dialysis.  Patient was found to be hypotensive as well 2.  Recent admission for a right lower extremity stump infection.  Patient is difficulties during this time.  Denies any illicit drug use.  No new medications going to him.  Patient was an hour and a half into his treatment and passed with hypotension and altered mental status started.  Last dialysis apparently was on Thursday.      Home Medications Prior to Admission medications   Medication Sig Start Date End Date Taking? Authorizing Provider  acetaminophen (TYLENOL) 325 MG tablet Take 2 tablets (650 mg total) by mouth every 6 (six) hours as needed for mild pain, moderate pain or fever. 02/06/21   Lonia Blood, MD  albuterol (VENTOLIN HFA) 108 (90 Base) MCG/ACT inhaler Inhale 2 puffs into the lungs every 4 (four) hours as needed for wheezing or shortness of breath. 07/21/20   Sherryll Burger, Pratik D, DO  amiodarone (PACERONE) 200 MG tablet Take 200 mg by mouth daily. 03/29/21   [provider]  apixaban (ELIQUIS) 5 MG TABS tablet Take 1 tablet (5 mg total) by mouth 2 (two) times daily. 08/23/19   Strader, Lennart Pall, PA-C  ascorbic acid (VITAMIN C) 250 MG tablet Take 1 tablet (250 mg total) by mouth 2 (two) times daily. 05/01/21 05/31/21  Arrien, York Ram, MD  aspirin 81 MG chewable tablet Chew 81 mg by mouth daily.    [provider]  atorvastatin (LIPITOR) 10 MG tablet Take 1 tablet (10 mg total) by mouth every evening. Patient taking differently: Take 10 mg by mouth every evening. Take 1 tablet on Monday, Wednesday and Friday. 08/03/19   Vassie Loll, MD  butalbital-acetaminophen-caffeine (FIORICET) [phone] MG tablet  Take 1 tablet by mouth every 6 (six) hours as needed for headache or migraine. 07/21/20   Sherryll Burger, Pratik D, DO  calcium acetate (PHOSLO) 667 MG capsule Take 1 capsule (667 mg total) by mouth 3 (three) times daily with meals. 02/21/19   Reva Bores, MD  calcium carbonate (TUMS - DOSED IN MG ELEMENTAL CALCIUM) 500 MG chewable tablet Chew 4 tablets (800 mg of elemental calcium total) by mouth 2 (two) times daily. 08/03/19   Vassie Loll, MD  Cholecalciferol (VITAMIN D3) 25 MCG (1000 UT) CAPS Take 1 capsule by mouth See admin instructions. Take before Dialysis on Monday Wednesday and Fridays    [provider]  midodrine (PROAMATINE) 10 MG tablet Take 1 tablet (10 mg total) by mouth 2 (two) times daily with a meal. 05/01/21 05/31/21  Arrien, York Ram, MD  multivitamin (RENA-VIT) TABS tablet Take 1 tablet by mouth at bedtime. 05/01/21 05/31/21  Arrien, York Ram, MD  nutrition supplement, JUVEN, (JUVEN) PACK Take 1 packet by mouth 2 (two) times daily between meals. 05/01/21 05/31/21  Arrien, York Ram, MD  Nutritional Supplements (,FEEDING SUPPLEMENT, PROSOURCE PLUS) liquid Take 30 mLs by mouth 2 (two) times daily between meals. 05/01/21 05/31/21  Arrien, York Ram, MD  oxyCODONE 10 MG TABS Take 1 tablet (10 mg total) by mouth every 6 (six) hours as needed for severe pain. 05/01/21   Arrien, York Ram, MD  pantoprazole (PROTONIX) 40 MG tablet Take 40  mg by mouth daily. 03/23/21   [provider]  tiZANidine (ZANAFLEX) 4 MG tablet Take 4 mg by mouth daily as needed. 03/23/21   [provider]      Allergies    Tape and Chlorhexidine gluconate [chlorhexidine]    Review of Systems   Review of Systems  Unable to perform ROS: Mental status change   Physical Exam Updated Vital Signs BP (!) 107/58   Pulse (!) 132   Temp (!) 97.4 F (36.3 C) (Oral)   Resp 20   SpO2 98%  Physical Exam Vitals and nursing note reviewed.  Constitutional:      General: He  is not in acute distress.    Appearance: Normal appearance. He is well-developed. He is not toxic-appearing.  HENT:     Head: Normocephalic and atraumatic.  Eyes:     General: Lids are normal.     Conjunctiva/sclera: Conjunctivae normal.     Pupils: Pupils are equal, round, and reactive to light.  Neck:     Thyroid: No thyroid mass.     Trachea: No tracheal deviation.  Cardiovascular:     Rate and Rhythm: Regular rhythm. Tachycardia present.     Heart sounds: Normal heart sounds. No murmur heard.   No gallop.  Pulmonary:     Effort: Pulmonary effort is normal. No respiratory distress.     Breath sounds: Normal breath sounds. No stridor. No decreased breath sounds, wheezing, rhonchi or rales.  Abdominal:     General: There is no distension.     Palpations: Abdomen is soft.     Tenderness: There is no abdominal tenderness. There is no rebound.  Musculoskeletal:        General: No tenderness. Normal range of motion.     Cervical back: Normal range of motion and neck supple.       Legs:  Skin:    General: Skin is warm and dry.     Findings: No abrasion or rash.  Neurological:     Mental Status: He is oriented to person, place, and time. Mental status is at baseline. He is lethargic.     GCS: GCS eye subscore is 4. GCS verbal subscore is 5. GCS motor subscore is 5.     Cranial Nerves: No cranial nerve deficit.     Sensory: No sensory deficit.     Motor: Motor function is intact.     Comments: Strength is 5 of 5 in all extremities  Psychiatric:        Attention and Perception: Attention normal.        Mood and Affect: Affect is blunt.        Speech: Speech is delayed.        Behavior: Behavior is slowed.    ED Results / Procedures / Treatments   Labs (all labs ordered are listed, but only abnormal results are displayed) Labs Reviewed  CBC - Abnormal; Notable for the following components:      Result Value   WBC 27.9 (*)    RBC 2.29 (*)    Hemoglobin 8.3 (*)    HCT 23.4  (*)    MCV 102.2 (*)    MCH 36.2 (*)    RDW 20.8 (*)    All other components within normal limits  CBG MONITORING, ED - Abnormal; Notable for the following components:   Glucose-Capillary 51 (*)    All other components within normal limits  CULTURE, BLOOD (ROUTINE X 2)  CULTURE, BLOOD (ROUTINE  X 2)  COMPREHENSIVE METABOLIC PANEL  LACTIC ACID, PLASMA  LACTIC ACID, PLASMA  AMMONIA  RAPID URINE DRUG SCREEN, HOSP PERFORMED  TYPE AND SCREEN    EKG EKG Interpretation  Date/Time:  Saturday May 15 2021 14:21:29 EDT Ventricular Rate:  132 PR Interval:    QRS Duration: 165 QT Interval:  347 QTC Calculation: 515 R Axis:   -45 Text Interpretation: Atrial fibrillation Ventricular premature complex Aberrant complex IVCD, consider atypical LBBB Confirmed by Lorre Nick ([id number]) on 05/15/2021 4:25:40 PM  Radiology No results found.  Procedures Procedures    Medications Ordered in ED Medications - No data to display  ED Course/ Medical Decision Making/ A&P                           Medical Decision Making Amount and/or Complexity of Data Reviewed Labs: ordered. Radiology: ordered.  Risk Prescription drug management. Decision regarding hospitalization.  Patient is EKG per my interpretation shows sinus tachycardia but is essentially unchanged from prior. Patient presented from dialysis with hypotension and altered mental status initially.  Was also found to be tachycardic as well 2.  He was afebrile here.  CBG checked and blood sugar was 47.  Given glucagon due to lack of IV access which did not help.  Patient subsequently had an IV established and was given IV dextrose due to persistent hypoglycemia.  Patient found to have significant leukocytosis of almost 28,000.  His lactate was 1.7 but patient will still be placed on sepsis protocol and started on IV antibiotics.  Blood cultures obtained.  Head CT per my review interpretation did not show any acute findings.  Patient's  ammonia level is 107 consistent with  hepatic encephalopathy.  Patient will be placed on IV dextrose drip of blood sugar does not improve.  Patient maintaining his airway at this time.  Plan will be to admit to the hospital service  CRITICAL CARE Performed by: Toy Baker Total critical care time: 55 minutes Critical care time was exclusive of separately billable procedures and treating other patients. Critical care was necessary to treat or prevent imminent or life-threatening deterioration. Critical care was time spent personally by me on the following activities: development of treatment plan with patient and/or surrogate as well as nursing, discussions with consultants, evaluation of patient's response to treatment, examination of patient, obtaining history from patient or surrogate, ordering and performing treatments and interventions, ordering and review of laboratory studies, ordering and review of radiographic studies, pulse oximetry and re-evaluation of patient's condition.         Final Clinical Impression(s) / ED Diagnoses Final diagnoses:  None    Rx / DC Orders ED Discharge Orders     None         Lorre Nick, MD 05/15/21 1630    Lorre Nick, MD 05/15/21 1901

## 2021-05-15 NOTE — Assessment & Plan Note (Addendum)
Hypotensive at admission 2/2 sepsis ?-Patient seen by palliative care and patient transitioned to full comfort measures. ?-Patient subsequently died at 56 hrs. 2021/05/31. ?

## 2021-05-15 NOTE — Subjective & Objective (Addendum)
[name], 51 y/o, with multiple comorbidities including ESRD-HD, HFrEF, DM2, chronic a. Fib. 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 He had ICU stay for hypotension. He did well with broad spectrum Abx. Op report 04/27/21 for surgical debridement left AKA and R BKA with superficial necrosis. Patient at dialysis on day of admission had episode of hypotension leading to discontinuation of HD, He was also hypoglycemic. Due to his rapid deterioration he was sent to [facility] for evaluation and treatment.  ? ?Review of chart reveals no prior history of liver disease or prior episodes of hyperamonemia or encephalopathy.  ?

## 2021-05-15 NOTE — ED Triage Notes (Signed)
Pt came from dialysis d/t AMS. Pt was 1 hour and half into the treatment. Staff reported pt exhibited hypotension and AMS, but does not know what his baseline is. HD on tus, thursday and thursdays. Pt complains of pain and rashes on his penis.  ?

## 2021-05-15 NOTE — H&P (Signed)
?History and Physical  ? ? ?Brendan Campbell DTO:[id number] DOB: Dec 23, 1970 DOA: 05/28/2021 ? ?DOS: the patient was seen and examined on 05/10/2021 ? ?PCP: Monico Blitz, MD  ? ?Patient coming from:  from HD center ? ?I have personally briefly reviewed patient's old medical records in [place] ? ?[name], 51 y/o, with multiple comorbidities including ESRD-HD, HFrEF, DM2, chronic a. Fib. 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 He had ICU stay for hypotension. He did well with broad spectrum Abx. Op report 04/27/21 for surgical debridement left AKA and R BKA with superficial necrosis. Patient at dialysis on day of admission had episode of hypotension leading to discontinuation of HD, He was also hypoglycemic. Due to his rapid deterioration he was sent to [facility] for evaluation and treatment.  ? ?Review of chart reveals no prior history of liver disease or prior episodes of hyperamonemia or encephalopathy.   ? ?ED Course: T 97.4  92/69  HR 131  R 17. Pertinent lab: glu 46!, Cr 2.54, Alk Phos 156, Alb 1.7. LFT nl, NH3 107. Lactic acid 1.7. WBC 27.9 - no diff, Hgb 8.3. CXR w/ interstial edema. Code sepsis initiated. ABX started.  ? ?Review of Systems:  ?Review of Systems  ?Unable to perform ROS: Mental status change  ? ?Past Medical History:  ?Diagnosis Date  ? Anemia   ? Anxiety   ? Blood transfusion without reported diagnosis   ? CHF (congestive heart failure) ([place])   ? a. EF 25% by echo in 07/2019  ? Chronic kidney disease   ? Depression   ? Diabetes mellitus without complication ([place])   ? End stage renal disease ([place])   ? M/W/F [name]  ? Hyperlipidemia   ? Neuropathy   ? Peripheral vascular disease ([place])   ? PTSD (post-traumatic stress disorder)   ? ? ?Past Surgical History:  ?Procedure Laterality Date  ? A/V FISTULAGRAM N/A 10/09/2018  ? Procedure: A/V FISTULAGRAM;  Surgeon: Serafina Mitchell, MD;  Location: [facility];  Service: Cardiovascular;   Laterality: N/A;  ? AMPUTATION Left 02/16/2021  ? Procedure: AMPUTATION BELOW KNEE;  Surgeon: Broadus John, MD;  Location: [facility];  Service: Vascular;  Laterality: Left;  ? AMPUTATION Left 04/22/2021  ? Procedure: LEFT ABOVE KNEE AMPUTATION;  Surgeon: Broadus John, MD;  Location: [facility];  Service: Vascular;  Laterality: Left;  ? AV FISTULA PLACEMENT Left 09/22/2016  ? Procedure: CREATION OF LEFT ARM ARTERIOVENOUS (AV) FISTULA;  Surgeon: Waynetta Sandy, MD;  Location: [place];  Service: Vascular;  Laterality: Left;  ? AV FISTULA PLACEMENT Left 10/31/2017  ? Procedure: INSERTION OF ARTERIOVENOUS (AV) GORE-TEX 4-35m STETCH GRAFT LEFT ARM;  Surgeon: CWaynetta Sandy MD;  [place] [facility]  Service: Vascular;  Laterality: Left;  ? BASCILIC VEIN TRANSPOSITION Left 08/17/2017  ? Procedure: SECOND STAGE BASILIC VEIN TRANSPOSITION LEFT ARM;  Surgeon: CWaynetta Sandy MD;  [place] [facility]  Service: Vascular;  Laterality: Left;  ? BELOW KNEE LEG AMPUTATION Right   ? CARDIOVERSION N/A 02/19/2019  ? Procedure: CARDIOVERSION;  Surgeon: HPixie Casino MD;  [facility];  Service: Cardiovascular;  Laterality: N/A;  ? CATARACT EXTRACTION W/PHACO Right 10/23/2020  ? Procedure: CATARACT EXTRACTION PHACO AND INTRAOCULAR LENS PLACEMENT (IOC);  Surgeon: WBaruch Goldmann MD;  [facility];  Service: Ophthalmology;  Laterality: Right;  CDE 39.03  ? CHOLECYSTECTOMY    ? FOOT SURGERY    ?  IR FLUORO GUIDE CV LINE RIGHT  05/16/2016  ? IR FLUORO GUIDE CV LINE RIGHT  02/16/2019  ? IR REMOVAL TUN CV CATH W/O FL  05/16/2016  ? IR REMOVAL TUN CV CATH W/O FL  02/19/2019  ? IR THROMBECTOMY AV FISTULA W/THROMBOLYSIS/PTA INC/SHUNT/IMG LEFT Left 11/09/2018  ? IR THROMBECTOMY AV FISTULA W/THROMBOLYSIS/PTA INC/SHUNT/IMG LEFT Left 06/22/2019  ? IR US GUIDE VASC ACCESS LEFT  11/09/2018  ? IR US GUIDE VASC ACCESS LEFT  06/22/2019  ? IR US GUIDE VASC ACCESS RIGHT  05/16/2016  ? IR US GUIDE VASC ACCESS RIGHT  02/16/2019  ?  PERIPHERAL VASCULAR BALLOON ANGIOPLASTY  10/09/2018  ? Procedure: PERIPHERAL VASCULAR BALLOON ANGIOPLASTY;  Surgeon: Serafina Mitchell, MD;  Location: [facility];  Service: Cardiovascular;;  ? TEE WITHOUT CARDIOVERSION N/A 02/19/2019  ? Procedure: TRANSESOPHAGEAL ECHOCARDIOGRAM (TEE);  Surgeon: [name], MD;  Location: [place];  Service: Cardiovascular;  Laterality: N/A;  ? WOUND DEBRIDEMENT Right 04/22/2021  ? Procedure: DEBRIDEMENT WOUND;  Surgeon: Broadus John, MD;  Location: [facility];  Service: Vascular;  Laterality: Right;  ? ?Soc Hx - lives with friend. Self -neglect. Does not keep appointments or follow care instructions ? ? reports that he quit smoking about 14 years ago. His smoking use included cigarettes. He has never used smokeless tobacco. He reports that he does not drink alcohol and does not use drugs. ? ?Allergies  ?Allergen Reactions  ? Tape Other (See Comments)  ?  Pulls off the skin  ? Chlorhexidine Gluconate [Chlorhexidine] Rash  ? ? ?Family History  ?Problem Relation Age of Onset  ? Cancer Mother   ?     lung  ? Diabetes Mother   ? Heart attack Father   ? Diabetes Father   ? Diabetes Sister   ? ? ?Prior to Admission medications   ?Medication Sig Start Date End Date Taking? Authorizing Provider  ?acetaminophen (TYLENOL) 325 MG tablet Take 2 tablets (650 mg total) by mouth every 6 (six) hours as needed for mild pain, moderate pain or fever. 02/06/21   Cherene Altes, MD  ?albuterol (VENTOLIN HFA) 108 (90 Base) MCG/ACT inhaler Inhale 2 puffs into the lungs every 4 (four) hours as needed for wheezing or shortness of breath. 07/21/20   Manuella Ghazi, Pratik D, DO  ?amiodarone (PACERONE) 200 MG tablet Take 200 mg by mouth daily. 03/29/21   [provider]  ?apixaban (ELIQUIS) 5 MG TABS tablet Take 1 tablet (5 mg total) by mouth 2 (two) times daily. 08/23/19   Strader, Fransisco Hertz, PA-C  ?ascorbic acid (VITAMIN C) 250 MG tablet Take 1 tablet (250 mg total) by mouth 2 (two) times daily.  05/01/21 05/31/21  Arrien, Jimmy Picket, MD  ?aspirin 81 MG chewable tablet Chew 81 mg by mouth daily.    [provider]  ?atorvastatin (LIPITOR) 10 MG tablet Take 1 tablet (10 mg total) by mouth every evening. ?Patient taking differently: Take 10 mg by mouth every evening. Take 1 tablet on Monday, Wednesday and Friday. 08/03/19   Barton [place], MD  ?butalbital-acetaminophen-caffeine (FIORICET) [phone] MG tablet Take 1 tablet by mouth every 6 (six) hours as needed for headache or migraine. 07/21/20   Manuella Ghazi, Pratik D, DO  ?calcium acetate (PHOSLO) 667 MG capsule Take 1 capsule (667 mg total) by mouth 3 (three) times daily with meals. 02/21/19   Donnamae Jude, MD  ?calcium carbonate (TUMS - DOSED IN MG ELEMENTAL CALCIUM) 500 MG chewable tablet Chew 4 tablets (800  mg of elemental calcium total) by mouth 2 (two) times daily. 08/03/19   Barton [place], MD  ?Cholecalciferol (VITAMIN D3) 25 MCG (1000 UT) CAPS Take 1 capsule by mouth See admin instructions. Take before Dialysis on Monday Wednesday and Fridays    [provider]  ?midodrine (PROAMATINE) 10 MG tablet Take 1 tablet (10 mg total) by mouth 2 (two) times daily with a meal. 05/01/21 05/31/21  Arrien, Jimmy Picket, MD  ?multivitamin (RENA-VIT) TABS tablet Take 1 tablet by mouth at bedtime. 05/01/21 05/31/21  Arrien, Jimmy Picket, MD  ?nutrition supplement, JUVEN, (JUVEN) PACK Take 1 packet by mouth 2 (two) times daily between meals. 05/01/21 05/31/21  Arrien, Jimmy Picket, MD  ?Nutritional Supplements (,FEEDING SUPPLEMENT, PROSOURCE PLUS) liquid Take 30 mLs by mouth 2 (two) times daily between meals. 05/01/21 05/31/21  Arrien, Jimmy Picket, MD  ?oxyCODONE 10 MG TABS Take 1 tablet (10 mg total) by mouth every 6 (six) hours as needed for severe pain. 05/01/21   Arrien, Jimmy Picket, MD  ?pantoprazole (PROTONIX) 40 MG tablet Take 40 mg by mouth daily. 03/23/21   [provider]  ?tiZANidine (ZANAFLEX) 4 MG tablet Take 4 mg by mouth  daily as needed. 03/23/21   [provider]  ? ? ?Physical Exam: ?Vitals:  ? 05/09/2021 1727 05/26/2021 1730 06/05/2021 1830 06/05/2021 1838  ?BP: 95/63 92/69 101/89   ?Pulse: 87 (!) 131  (!) 130  ?Resp: 13 17

## 2021-05-15 NOTE — Assessment & Plan Note (Addendum)
Was hypoglycemic at admission. No glycemic meds on med rec. ?-Patient was transitioned to full comfort measures. ?

## 2021-05-15 NOTE — Assessment & Plan Note (Addendum)
Patient with florid infection left AKA with purulent drainage and malodor. Marked leukcytosis. In ED code sepsis initiated with VAnc, Flagyl, Imipenem. ?With severe protein malnutrition, DM, poor adherence healing will be very challenging. Dr. Virl Cagey, vascular surgery, and assessed the patient and followed the patient early on during the hospitalization. ?-Patient seen in consultation by ID. ?-Blood cultures done positive for Klebsiella pneumoniae, carbapenem resistant. ?-Patient noted with a significant leukocytosis present. ?-CT angiogram of the left lower extremity with thrombosis of the distal SFA popliteal artery, trace foci of subcutaneous gas. ?-Patient seen by palliative care. ?-Patient was subsequently transitioned to full comfort measures. ?-Patient subsequently died at 55 hrs. on Jun 13, 2021. ?

## 2021-05-15 NOTE — Assessment & Plan Note (Addendum)
Multi-factorial. ?-Likely secondary to sepsis, hypoglycemia, hypotension. ?-Patient seen by palliative care and transitioned to full comfort measures. ?

## 2021-05-15 NOTE — Assessment & Plan Note (Addendum)
-  Patient noted to be borderline hypotensive on admission.  -Patient received IV digoxin.   ?-Patient seen by palliative care and transitioned to full comfort measures.   ?

## 2021-05-15 NOTE — Assessment & Plan Note (Addendum)
No apparent decompensation. Pulmonary edema likely 2/2 fluid overload from missed HD. ?-Patient was seen by nephrology earlier on during the hospitalization and noted to have a poor prognosis. ?-Palliative care consulted and patient transition to full comfort measures. ?-Patient subsequently died at 67 hrs. on 06/04/2021. ?

## 2021-05-15 NOTE — ED Notes (Signed)
Pt's sugar dropped down to 47. MD Zenia Resides made aware. 1 amp of D50 given.  ?

## 2021-05-15 NOTE — Consult Note (Signed)
?Hospital Consult ? ? ? ?Reason for Consult: Postoperative wound infection ?Requesting Physician: ED ?MRN #:  [id number] ? ?History of Present Illness: This is a 51 y.o. 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 ? ?Brendan Campbell has a complex medical history including heart failure, end-stage renal disease, failure to thrive with nutritional insufficiency.  Albumin at last admission was 1.6.  At the time of his amputation, Brendan Campbell nearly arrested in the operating room requiring an ICU stay for hypotension. ? ?Brendan Campbell presents from dialysis after hypotensive episode, with marked hypoglycemia.  On exam, Brendan Campbell was encephalopathic, unable to answer questions appropriately. ? ?Past Medical History:  ?Diagnosis Date  ? Anemia   ? Anxiety   ? Blood transfusion without reported diagnosis   ? CHF (congestive heart failure) ([place])   ? a. EF 25% by echo in 07/2019  ? Chronic kidney disease   ? Depression   ? Diabetes mellitus without complication ([place])   ? End stage renal disease ([place])   ? M/W/F [name]  ? Hyperlipidemia   ? Neuropathy   ? Peripheral vascular disease ([place])   ? PTSD (post-traumatic stress disorder)   ? ? ?Past Surgical History:  ?Procedure Laterality Date  ? A/V FISTULAGRAM N/A 10/09/2018  ? Procedure: A/V FISTULAGRAM;  Surgeon: Serafina Mitchell, MD;  Location: [facility];  Service: Cardiovascular;  Laterality: N/A;  ? AMPUTATION Left 02/16/2021  ? Procedure: AMPUTATION BELOW KNEE;  Surgeon: Broadus John, MD;  Location: [facility];  Service: Vascular;  Laterality: Left;  ? AMPUTATION Left 04/22/2021  ? Procedure: LEFT ABOVE KNEE AMPUTATION;  Surgeon: Broadus John, MD;  Location: [place] [facility];  Service: Vascular;  Laterality: Left;  ? AV FISTULA PLACEMENT Left 09/22/2016  ? Procedure: CREATION OF LEFT ARM ARTERIOVENOUS (AV) FISTULA;  Surgeon: Waynetta Sandy, MD;  Location: [place] [facility];  Service: Vascular;   Laterality: Left;  ? AV FISTULA PLACEMENT Left 10/31/2017  ? Procedure: INSERTION OF ARTERIOVENOUS (AV) GORE-TEX 4-39mm STETCH GRAFT LEFT ARM;  Surgeon: Waynetta Sandy, MD;  Location: [facility];  Service: Vascular;  Laterality: Left;  ? BASCILIC VEIN TRANSPOSITION Left 08/17/2017  ? Procedure: SECOND STAGE BASILIC VEIN TRANSPOSITION LEFT ARM;  Surgeon: Waynetta Sandy, MD;  Location: [facility];  Service: Vascular;  Laterality: Left;  ? BELOW KNEE LEG AMPUTATION Right   ? CARDIOVERSION N/A 02/19/2019  ? Procedure: CARDIOVERSION;  Surgeon: [name], MD;  Location: [facility];  Service: Cardiovascular;  Laterality: N/A;  ? CATARACT EXTRACTION W/PHACO Right 10/23/2020  ? Procedure: CATARACT EXTRACTION PHACO AND INTRAOCULAR LENS PLACEMENT (IOC);  Surgeon: Baruch Goldmann, MD;  [facility];  Service: Ophthalmology;  Laterality: Right;  CDE 39.03  ? CHOLECYSTECTOMY    ? FOOT SURGERY    ? IR FLUORO GUIDE CV LINE RIGHT  05/16/2016  ? IR FLUORO GUIDE CV LINE RIGHT  02/16/2019  ? IR REMOVAL TUN CV CATH W/O FL  05/16/2016  ? IR REMOVAL TUN CV CATH W/O FL  02/19/2019  ? IR THROMBECTOMY AV FISTULA W/THROMBOLYSIS/PTA INC/SHUNT/IMG LEFT Left 11/09/2018  ? IR THROMBECTOMY AV FISTULA W/THROMBOLYSIS/PTA INC/SHUNT/IMG LEFT Left 06/22/2019  ? IR US GUIDE VASC ACCESS LEFT  11/09/2018  ? IR US GUIDE VASC ACCESS LEFT  06/22/2019  ? IR US GUIDE VASC ACCESS RIGHT  05/16/2016  ? IR US GUIDE VASC ACCESS RIGHT  02/16/2019  ? PERIPHERAL VASCULAR BALLOON ANGIOPLASTY  10/09/2018  ? Procedure:  PERIPHERAL VASCULAR BALLOON ANGIOPLASTY;  Surgeon: Serafina Mitchell, MD;  Location: [facility];  Service: Cardiovascular;;  ? TEE WITHOUT CARDIOVERSION N/A 02/19/2019  ? Procedure: TRANSESOPHAGEAL ECHOCARDIOGRAM (TEE);  Surgeon: [name], MD;  Location: [place];  Service: Cardiovascular;  Laterality: N/A;  ? WOUND DEBRIDEMENT Right 04/22/2021  ? Procedure: DEBRIDEMENT WOUND;  Surgeon: Broadus John, MD;  Location: [facility];  Service:  Vascular;  Laterality: Right;  ? ? ?Allergies  ?Allergen Reactions  ? Tape Other (See Comments)  ?  Pulls off the skin  ? Chlorhexidine Gluconate [Chlorhexidine] Rash  ? ? ?Prior to Admission medications   ?Medication Sig Start Date End Date Taking? Authorizing Provider  ?acetaminophen (TYLENOL) 325 MG tablet Take 2 tablets (650 mg total) by mouth every 6 (six) hours as needed for mild pain, moderate pain or fever. 02/06/21   Cherene Altes, MD  ?albuterol (VENTOLIN HFA) 108 (90 Base) MCG/ACT inhaler Inhale 2 puffs into the lungs every 4 (four) hours as needed for wheezing or shortness of breath. 07/21/20   Manuella Ghazi, Pratik D, DO  ?amiodarone (PACERONE) 200 MG tablet Take 200 mg by mouth daily. 03/29/21   [provider]  ?apixaban (ELIQUIS) 5 MG TABS tablet Take 1 tablet (5 mg total) by mouth 2 (two) times daily. 08/23/19   Strader, Fransisco Hertz, PA-C  ?ascorbic acid (VITAMIN C) 250 MG tablet Take 1 tablet (250 mg total) by mouth 2 (two) times daily. 05/01/21 05/31/21  Arrien, Jimmy Picket, MD  ?aspirin 81 MG chewable tablet Chew 81 mg by mouth daily.    [provider]  ?atorvastatin (LIPITOR) 10 MG tablet Take 1 tablet (10 mg total) by mouth every evening. ?Patient taking differently: Take 10 mg by mouth every evening. Take 1 tablet on Monday, Wednesday and Friday. 08/03/19   Barton [place], MD  ?butalbital-acetaminophen-caffeine (FIORICET) [phone] MG tablet Take 1 tablet by mouth every 6 (six) hours as needed for headache or migraine. 07/21/20   Manuella Ghazi, Pratik D, DO  ?calcium acetate (PHOSLO) 667 MG capsule Take 1 capsule (667 mg total) by mouth 3 (three) times daily with meals. 02/21/19   Donnamae Jude, MD  ?calcium carbonate (TUMS - DOSED IN MG ELEMENTAL CALCIUM) 500 MG chewable tablet Chew 4 tablets (800 mg of elemental calcium total) by mouth 2 (two) times daily. 08/03/19   Barton [place], MD  ?Cholecalciferol (VITAMIN D3) 25 MCG (1000 UT) CAPS Take 1 capsule by mouth See admin instructions. Take  before Dialysis on Monday Wednesday and Fridays    [provider]  ?midodrine (PROAMATINE) 10 MG tablet Take 1 tablet (10 mg total) by mouth 2 (two) times daily with a meal. 05/01/21 05/31/21  Arrien, Jimmy Picket, MD  ?multivitamin (RENA-VIT) TABS tablet Take 1 tablet by mouth at bedtime. 05/01/21 05/31/21  Arrien, Jimmy Picket, MD  ?nutrition supplement, JUVEN, (JUVEN) PACK Take 1 packet by mouth 2 (two) times daily between meals. 05/01/21 05/31/21  Arrien, Jimmy Picket, MD  ?Nutritional Supplements (,FEEDING SUPPLEMENT, PROSOURCE PLUS) liquid Take 30 mLs by mouth 2 (two) times daily between meals. 05/01/21 05/31/21  Arrien, Jimmy Picket, MD  ?oxyCODONE 10 MG TABS Take 1 tablet (10 mg total) by mouth every 6 (six) hours as needed for severe pain. 05/01/21   Arrien, Jimmy Picket, MD  ?pantoprazole (PROTONIX) 40 MG tablet Take 40 mg by mouth daily. 03/23/21   [provider]  ?tiZANidine (ZANAFLEX) 4 MG tablet Take 4 mg by mouth daily as needed. 03/23/21  [provider]  ? ? ?Social History  ? ?Socioeconomic History  ? Marital status: Single  ?  Spouse name: Not on file  ? Number of children: Not on file  ? Years of education: Not on file  ? Highest education level: Not on file  ?Occupational History  ? Not on file  ?Tobacco Use  ? Smoking status: Former  ?  Types: Cigarettes  ?  Quit date: 09/03/2006  ?  Years since quitting: 14.7  ? Smokeless tobacco: Never  ?Vaping Use  ? Vaping Use: Never used  ?Substance and Sexual Activity  ? Alcohol use: No  ? Drug use: No  ? Sexual activity: Not Currently  ?Other Topics Concern  ? Not on file  ?Social History Narrative  ? Pt lives in [place] with roommate.  Not followed by an outpatient provider currently, but is trying to schedule an appointment with [facility].  ? ?Social Determinants of Health  ? ?Financial Resource Strain: Not on file  ?Food Insecurity: Not on file  ?Transportation Needs: Not on file  ?Physical Activity:  Not on file  ?Stress: Not on file  ?Social Connections: Not on file  ?Intimate Partner Violence: Not on file  ? ?Family History  ?Problem Relation Age of Onset  ? Cancer Mother   ?     lung  ? Diabetes [place]

## 2021-05-15 NOTE — Progress Notes (Addendum)
HOSPITAL MEDICINE OVERNIGHT EVENT NOTE   ? ?Notified by nursing that patient is exhibiting rapid atrial fibrillation with heart rates ranging between 1 30-1 50. ? ?Chart reviewed, patient is currently suffering from sepsis secondary to extensive soft tissue infection.  Patient currently on broad-spectrum intravenous antibiotics with vancomycin, Flagyl and cefepime with admitting provider having requested consultation with Dr. Unk Lightning with vascular surgery who is to evaluate the patient the a.m. of 4/9. ? ?Rapid fibrillation likely secondary to underlying sepsis.  Review of vitals reveals the patient is hypotensive with systolic blood pressures around 90.  Review of previous hospitalization reveals baseline blood pressures are typically between 100 to 110's.  Despite ESRD we will go ahead and administer a 500 cc LR bolus, reassess and rebolus as necessary to attempt to resolve hypotension. ? ?Finally in an attempt to achieve rate control we will go ahead and load with digoxin 500 mcg now followed by 250 mcg in another 6 hours.  Patient is already on scheduled oral amiodarone and with this degree of hypotension I do not believe that placing patient on any intravenous amiodarone infusion would be appropriate. ? ? ?Vernelle Emerald  MD ?[facility]  ? ?ADDENDUM 12:55am 4/9 ? ?Reviewed Dr. Virl Cagey Recommendations.  He recommends continued broad-spectrum intravenous antibiotics as well as CT angiogram of the left stump to evaluate for any evidence of bony involvement or abscess formation. ? ?CTA LLE has been ordered.   Since patient has ESRD, will need to consult nephrology in the morning to initiate hemodialysis after the contrast study. ? ?Continuing to monitor closely. ? ?Sherryll Burger Anika Shore ? ?ADDENDUM 6:35AM 4/9 ? ?Patient evaluated at the bedside. ? ?Vital signs are reviewed revealing resolving hypotension with last blood pressure of 108/50.  Heart rate has come down somewhat and is averaging 120.  Telemetry  reveals wide-complex rhythm with likely intermittent atrial fibrillation. ? ?Patient is quite lethargic but arousable and only intermittently follows commands.  Lung exam reveals bibasilar rales with heart rate that is irregularly irregular.  Left AKA with skin exam revealing numerous wounds with a particularly concerning wound at the left AKA stump. ? ?Continuing broad-spectrum antibiotics and intermittent small boluses for ongoing infection.  Patient to undergo CT angiogram of the left lower extremity shortly per vascular surgery recommendations.  Day team to contact nephrology to undergo dialysis afterwards to remove the contrast.  I additionally believe the patient should be n.p.o. for now until he is less lethargic and can pass a swallow screen. ? ?Digoxin load is complete.  Day team to additionally consider cardiology consultation. ? ?Sherryll Burger Sereena Marando ? ? ? ? ? ? ? ? ? ? ? ? ? ? ? ? ?

## 2021-05-15 NOTE — Progress Notes (Signed)
Pharmacy Antibiotic Note ? ?Brendan Campbell is a 51 y.o. male for which pharmacy has been consulted for cefepime and vancomycin dosing for sepsis. ? ?Patient with a history of  left leg severe infection status post BKA, PAF on Eliquis, chronic combined systolic and diastolic CHF, IDDM, diabetic neuropathy, ESRD on HD, anemia secondary to chronic CKD. Patient presenting with AMS at HD. ? ?Hypotension and AMS 1.5 hrs into HD session. Hypoglycemic in ED. ? ?WBC 27.9; LA 1.7; T 97.4 F; HR 132; RR 20 ? ?Plan: ?Vancomycin 1750 mg once - subsequent dosing per HD schedule ?Metronidazole per MD ?Cefepime 1g q24hr (2g given in ED - will schedule when HD schedule known - not due until next session) ?Trend WBC, Fever, Renal function, & Clinical course ?F/u cultures, clinical course, WBC, fever ?De-escalate when able ?F/u Nephrology plan ? ?  ? ?Temp (24hrs), Avg:97.4 ?F (36.3 ?C), Min:97.4 ?F (36.3 ?C), Max:97.4 ?F (36.3 ?C) ? ?Recent Labs  ?Lab 06/03/2021 ?1435 05/27/2021 ?1436  ?WBC  --  27.9*  ?CREATININE  --  2.53*  ?LATICACIDVEN 1.7  --   ?  ?CrCl cannot be calculated (Unknown ideal weight.).   ? ?Allergies  ?Allergen Reactions  ? Tape Other (See Comments)  ?  Pulls off the skin  ? Chlorhexidine Gluconate [Chlorhexidine] Rash  ? ? ?Antimicrobials this admission: ?metronidazole 4/8 >>  ?cefepime 4/8 >>  ?vancomycin 4/8 >>  ? ?Microbiology results: ?Pending ? ?Thank you for allowing pharmacy to be a part of this patient?s care. ? ?Lorelei Pont, PharmD, BCPS ?05/12/2021 5:05 PM ?ED Clinical Pharmacist -  [phone] ?  ?

## 2021-05-15 NOTE — Assessment & Plan Note (Addendum)
-   Patient transitioned to full comfort measures. ?

## 2021-05-15 NOTE — Assessment & Plan Note (Addendum)
-   Patient with multiple co-morbidities and very poor prognosis. ?-Patient seen by palliative care and patient transition to full comfort measures. ?-Patient kept comfortable and subsequently died at 0250 hrs. on 06-11-2021. ?

## 2021-05-16 ENCOUNTER — Inpatient Hospital Stay (HOSPITAL_COMMUNITY): Payer: Medicaid Other

## 2021-05-16 ENCOUNTER — Other Ambulatory Visit: Payer: Self-pay

## 2021-05-16 DIAGNOSIS — N186 End stage renal disease: Secondary | ICD-10-CM

## 2021-05-16 DIAGNOSIS — G9340 Encephalopathy, unspecified: Secondary | ICD-10-CM | POA: Diagnosis not present

## 2021-05-16 DIAGNOSIS — Z515 Encounter for palliative care: Secondary | ICD-10-CM | POA: Diagnosis not present

## 2021-05-16 DIAGNOSIS — Z7189 Other specified counseling: Secondary | ICD-10-CM

## 2021-05-16 DIAGNOSIS — T8740 Infection of amputation stump, unspecified extremity: Secondary | ICD-10-CM

## 2021-05-16 DIAGNOSIS — E782 Mixed hyperlipidemia: Secondary | ICD-10-CM

## 2021-05-16 DIAGNOSIS — I482 Chronic atrial fibrillation, unspecified: Secondary | ICD-10-CM | POA: Diagnosis not present

## 2021-05-16 DIAGNOSIS — I10 Essential (primary) hypertension: Secondary | ICD-10-CM

## 2021-05-16 DIAGNOSIS — Z992 Dependence on renal dialysis: Secondary | ICD-10-CM

## 2021-05-16 DIAGNOSIS — I5042 Chronic combined systolic (congestive) and diastolic (congestive) heart failure: Secondary | ICD-10-CM | POA: Diagnosis not present

## 2021-05-16 LAB — BLOOD CULTURE ID PANEL (REFLEXED) - BCID2
A.calcoaceticus-baumannii: NOT DETECTED
Bacteroides fragilis: NOT DETECTED
CTX-M ESBL: DETECTED — AB
Candida albicans: NOT DETECTED
Candida auris: NOT DETECTED
Candida glabrata: NOT DETECTED
Candida krusei: NOT DETECTED
Candida parapsilosis: NOT DETECTED
Candida tropicalis: NOT DETECTED
Carbapenem resist OXA 48 LIKE: NOT DETECTED
Carbapenem resistance IMP: NOT DETECTED
Carbapenem resistance KPC: DETECTED — AB
Carbapenem resistance NDM: NOT DETECTED
Carbapenem resistance VIM: NOT DETECTED
Cryptococcus neoformans/gattii: NOT DETECTED
Enterobacter cloacae complex: NOT DETECTED
Enterobacterales: DETECTED — AB
Enterococcus Faecium: NOT DETECTED
Enterococcus faecalis: NOT DETECTED
Escherichia coli: NOT DETECTED
Haemophilus influenzae: NOT DETECTED
Klebsiella aerogenes: NOT DETECTED
Klebsiella oxytoca: NOT DETECTED
Klebsiella pneumoniae: DETECTED — AB
Listeria monocytogenes: NOT DETECTED
Neisseria meningitidis: NOT DETECTED
Proteus species: NOT DETECTED
Pseudomonas aeruginosa: NOT DETECTED
Salmonella species: NOT DETECTED
Serratia marcescens: NOT DETECTED
Staphylococcus aureus (BCID): NOT DETECTED
Staphylococcus epidermidis: NOT DETECTED
Staphylococcus lugdunensis: NOT DETECTED
Staphylococcus species: NOT DETECTED
Stenotrophomonas maltophilia: NOT DETECTED
Streptococcus agalactiae: NOT DETECTED
Streptococcus pneumoniae: NOT DETECTED
Streptococcus pyogenes: NOT DETECTED
Streptococcus species: NOT DETECTED

## 2021-05-16 LAB — GLUCOSE, CAPILLARY
Glucose-Capillary: 64 mg/dL — ABNORMAL LOW (ref 70–99)
Glucose-Capillary: 76 mg/dL (ref 70–99)
Glucose-Capillary: 93 mg/dL (ref 70–99)
Glucose-Capillary: 75 mg/dL (ref 70–99)
Glucose-Capillary: 76 mg/dL (ref 70–99)
Glucose-Capillary: 56 mg/dL — ABNORMAL LOW (ref 70–99)
Glucose-Capillary: 96 mg/dL (ref 70–99)
Glucose-Capillary: 63 mg/dL — ABNORMAL LOW (ref 70–99)
Glucose-Capillary: 110 mg/dL — ABNORMAL HIGH (ref 70–99)
Glucose-Capillary: 105 mg/dL — ABNORMAL HIGH (ref 70–99)

## 2021-05-16 IMAGING — CT CT ANGIO EXTREM LOW*L*
2 of 7 series · 13 of 36 positions shown · IV contrast (APPLIED)
Comparison: CT AP, [DATE].  CTA AP runoff, [DATE].

CLINICAL DATA: Lower extremity abscess left AKA stump infection,
please evaluate for presence of abscess / fluid collection

EXAM:
CT ANGIOGRAPHY OF THE LEFT LOWEREXTREMITY
TECHNIQUE: Multidetector CT imaging of the LEFT lowerwas performed using the
standard protocol during bolus administration of intravenous
contrast. Multiplanar CT image reconstructions and MIPs were
obtained to evaluate the vascular anatomy.

[Series 5: arterial · axial · arterial · 0.60mm/px · z∈[-1544,-1081]mm · 12 of 276 slices shown]
[im 22/276  soft-tissue]
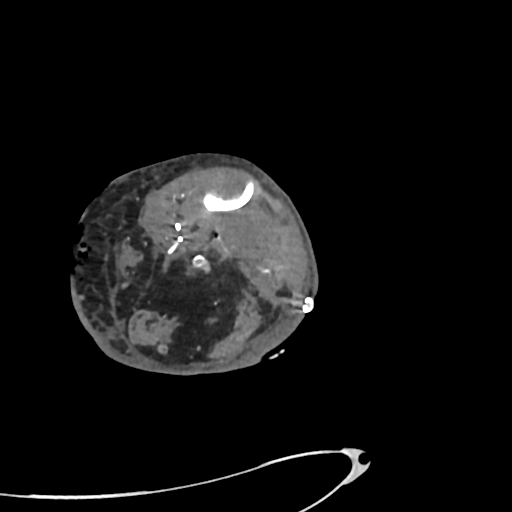
[im 43/276  bone]
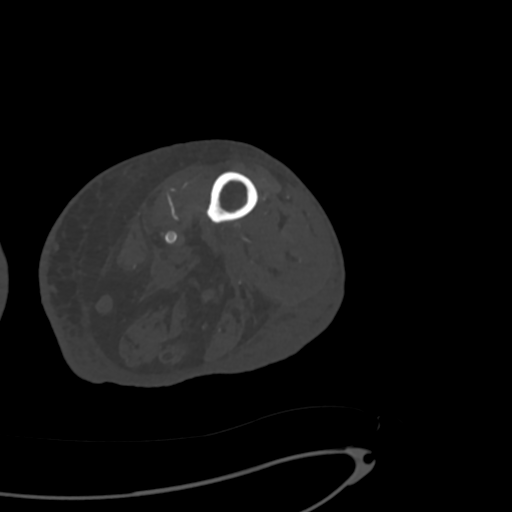
[im 64/276  soft-tissue]
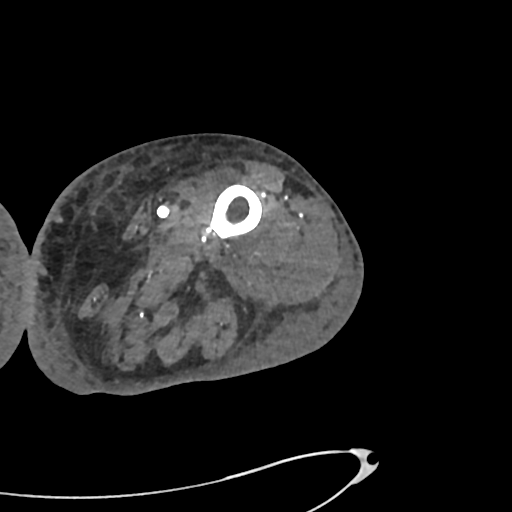
[im 85/276  bone]
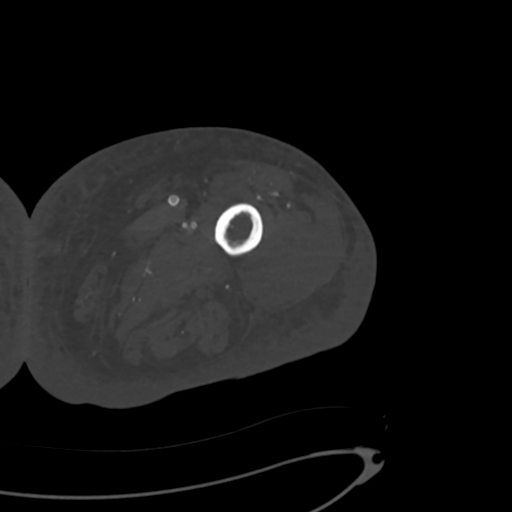
[im 106/276  soft-tissue]
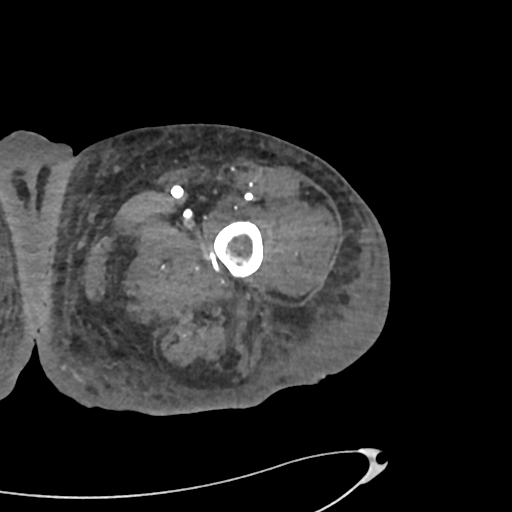
[im 127/276  bone]
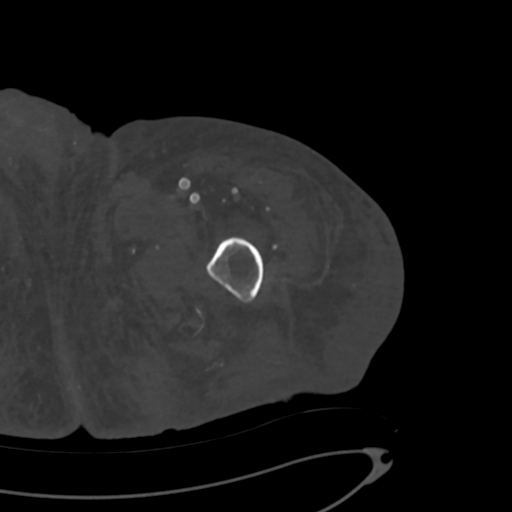
[im 149/276  soft-tissue]
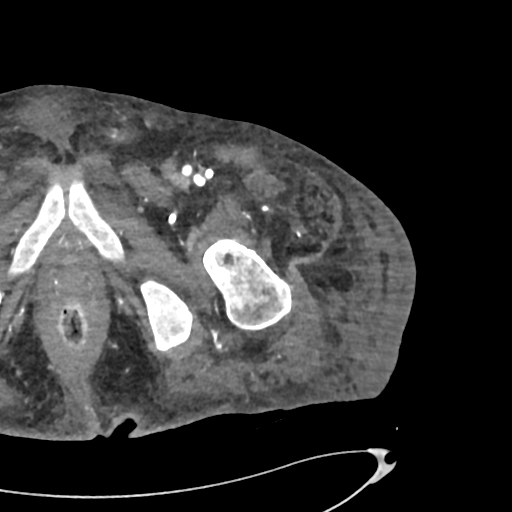
[im 170/276  bone]
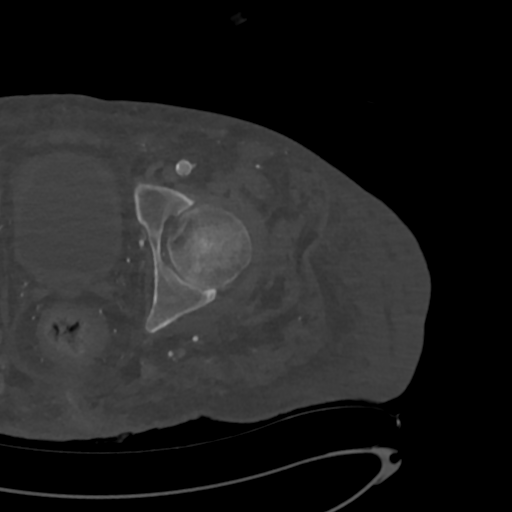
[im 191/276  soft-tissue]
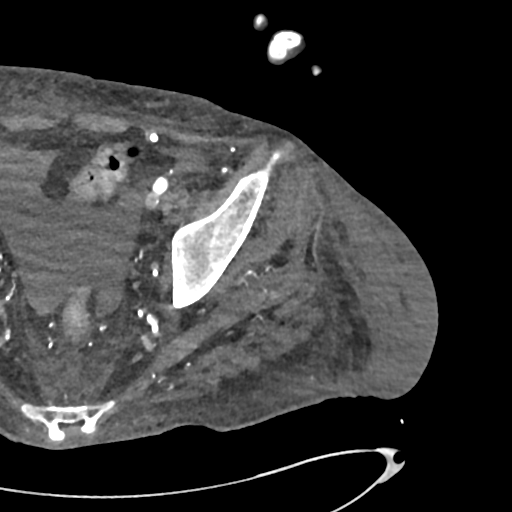
[im 212/276  bone]
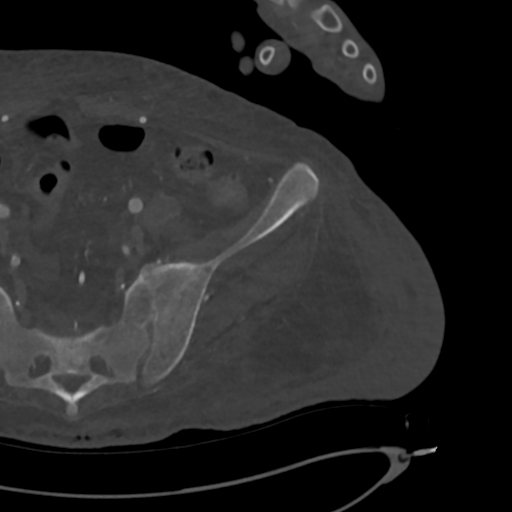
[im 233/276  soft-tissue]
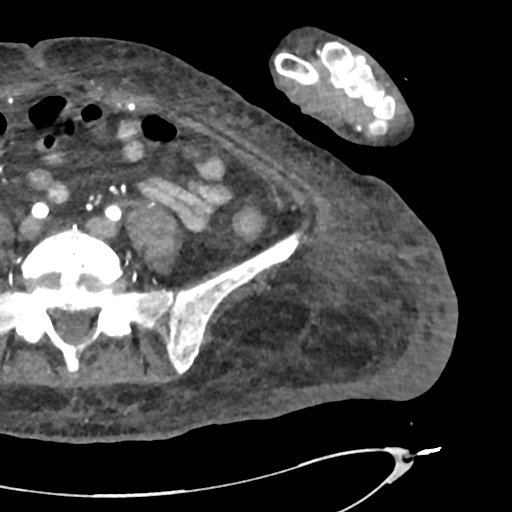
[im 254/276  bone]
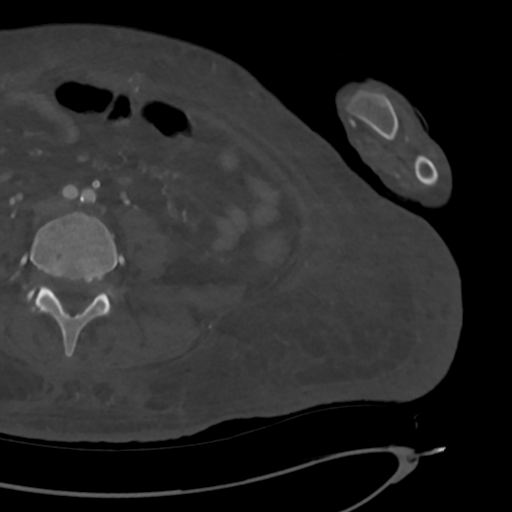

[Series 8: sag abd · sagittal · 0.74mm/px · 1 of 143 slices shown]
[im 9/143  soft-tissue]
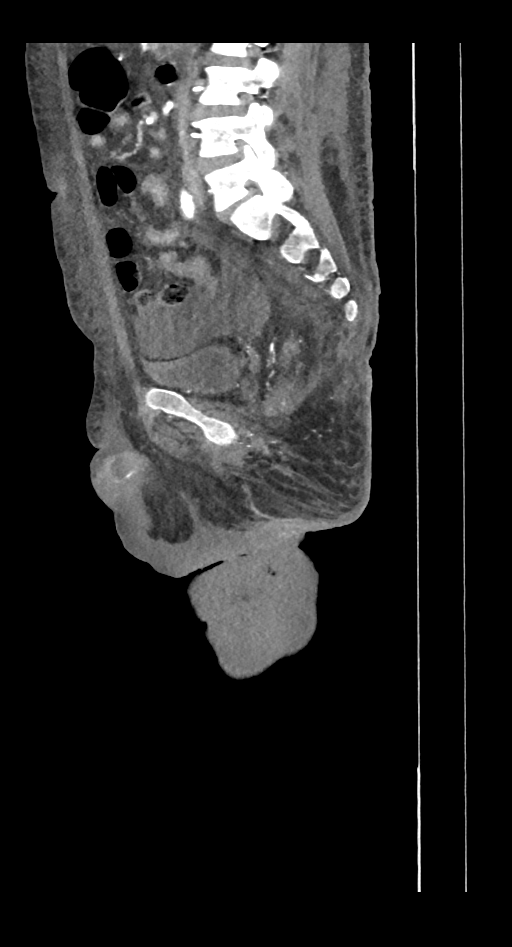

[13 of 36 positions shown; findings below may reference images not displayed]

RADIATION DOSE REDUCTION: This exam was performed according to the
departmental dose-optimization program which includes automated
exposure control, adjustment of the mA and/or kV according to
patient size and/or use of iterative reconstruction technique.

CONTRAST:  100mL OMNIPAQUE IOHEXOL 350 MG/ML SOLN
FINDINGS: VASCULAR;

Moderate calcified and noncalcified atherosclerotic disease of the
distal aorta and bifurcation without aneurysmal dilation.

Patent bilateral CIA, iliac bifurcations, and LEFT EIA.

Severe burden of small vessel and microvascular atherosclerosis, as
can be seen with diabetic disease

Patent LEFT CFA, proximal SFA and profunda.

Examination is severely limited secondary to extensive mural
calcifications.

Thrombosis of the distal SFA/proximal popliteal artery, past the
level of the adductor canal as can be seen in a AKA.

NONVASCULAR;

Trace volume of free fluid dependent pelvis. Imaged portions of
bowel is nonobstructed.

Moderate body wall edema.

Postsurgical changes of RIGHT above the knee amputation

Intact staple line at the medial incision and small foci of
subcutaneous gas

Removal of lateral incision staples with appearance of incisional
packing.

No discrete focal drainable collection or abscess.

No focal demineralization erosion or periosteal change at the
femoral stump to suggest osteomyelitis.

No follow-up is indicated for incidental findings, unless
specifically mentioned.

Review of the MIP images confirms the above findings.
IMPRESSION: 1. Thrombosis of the LEFT distal SFA/proximal popliteal artery, as
can be seen in AKA. Severe small vessel and microvascular
atherosclerosis, present within the geniculate arteries and as can
be seen in diabetic disease may limit stump healing.
2. LEFT AKA with open lateral staple line and incisional packing. No
discrete focal drainable collection abscess.
3. Trace foci of subcutaneous gas at the medial incision. Findings
likely postprocedural, however clinical correlation is recommended.
4. No osseous changes of the LEFT femoral stump to suggest
osteomyelitis.

## 2021-05-16 MED ORDER — DEXTROSE 50 % IV SOLN
12.5000 g | INTRAVENOUS | Status: AC
  Administered 2021-05-16: 12.5 g via INTRAVENOUS
  Filled 2021-05-16: qty 50

## 2021-05-16 MED ORDER — VANCOMYCIN HCL 500 MG/100ML IV SOLN
500.0000 mg | Freq: Once | INTRAVENOUS | Status: AC
  Administered 2021-05-16: 500 mg via INTRAVENOUS
  Filled 2021-05-16: qty 100

## 2021-05-16 MED ORDER — LIP MEDEX EX OINT
TOPICAL_OINTMENT | CUTANEOUS | Status: DC | PRN
  Administered 2021-05-16 (×2): 1 via TOPICAL
  Filled 2021-05-16: qty 7

## 2021-05-16 MED ORDER — CEFTAZIDIME-AVIBACTAM 2.5 (2-0.5) G IV SOLR
0.9400 g | INTRAVENOUS | Status: DC
  Filled 2021-05-16: qty 4.51

## 2021-05-16 MED ORDER — SODIUM CHLORIDE 0.9 % IV SOLN
1.0000 g | INTRAVENOUS | Status: DC
  Filled 2021-05-16: qty 10

## 2021-05-16 MED ORDER — DEXTROSE 50 % IV SOLN
25.0000 mL | INTRAVENOUS | Status: DC | PRN
  Administered 2021-05-16 – 2021-05-17 (×3): 25 mL via INTRAVENOUS
  Filled 2021-05-16 (×2): qty 50

## 2021-05-16 MED ORDER — CHLORHEXIDINE GLUCONATE CLOTH 2 % EX PADS
6.0000 | MEDICATED_PAD | Freq: Every day | CUTANEOUS | Status: DC
  Administered 2021-05-17: 6 via TOPICAL

## 2021-05-16 MED ORDER — POVIDONE-IODINE 10 % EX SOLN
CUTANEOUS | Status: DC | PRN
  Filled 2021-05-16: qty 118

## 2021-05-16 MED ORDER — SODIUM CHLORIDE 0.9 % IV SOLN
0.9400 g | INTRAVENOUS | Status: DC
  Administered 2021-05-16: 0.94 g via INTRAVENOUS
  Filled 2021-05-16 (×2): qty 4.51

## 2021-05-16 MED ORDER — IOHEXOL 350 MG/ML SOLN
100.0000 mL | Freq: Once | INTRAVENOUS | Status: AC | PRN
  Administered 2021-05-16: 100 mL via INTRAVENOUS

## 2021-05-16 NOTE — Progress Notes (Signed)
PHARMACY - PHYSICIAN COMMUNICATION ?CRITICAL VALUE ALERT - BLOOD CULTURE IDENTIFICATION (BCID) ? ?Brendan Campbell is an 51 y.o. male who presented to [facility] on 05/14/2021 with a chief complaint of sepsis.  ? ?Assessment: 51 yo male presents with sepsis due to nonhealing L-sided AKA (procedure on 04/22/21). The patient has a past medical history of HFrEF, ESRD on HD MWF, diabetes mellitus type II, atrial fibrillation, PAD, hyperlipidemia, and neuropathy. Blood cultures from previous admission on 3/14 were negative. BCID alert during this admission for Klebsiella pneumoniae in 1/3 bottles, CTX-M ESBL, and KPC detected.  ? ?Name of physician (or Provider) Contacted: Dr. Linus Salmons and Dr. Louanne Belton ? ?Current antibiotics: Cefepime, metronidazole, and vancomycin ? ?Changes to prescribed antibiotics recommended: Stop cefepime and vancomycin. Initiate ceftazidime/avibactam 0.94 g Q24H (HD dosing). Will give dose after HD on dialysis days. ? ? ?Results for orders placed or performed during the hospital encounter of 05/17/2021  ?Blood Culture ID Panel (Reflexed) (Collected: 05/17/2021  3:07 PM)  ?Result Value Ref Range  ? Enterococcus faecalis NOT DETECTED NOT DETECTED  ? Enterococcus Faecium NOT DETECTED NOT DETECTED  ? Listeria monocytogenes NOT DETECTED NOT DETECTED  ? Staphylococcus species NOT DETECTED NOT DETECTED  ? Staphylococcus aureus (BCID) NOT DETECTED NOT DETECTED  ? Staphylococcus epidermidis NOT DETECTED NOT DETECTED  ? Staphylococcus lugdunensis NOT DETECTED NOT DETECTED  ? Streptococcus species NOT DETECTED NOT DETECTED  ? Streptococcus agalactiae NOT DETECTED NOT DETECTED  ? Streptococcus pneumoniae NOT DETECTED NOT DETECTED  ? Streptococcus pyogenes NOT DETECTED NOT DETECTED  ? A.calcoaceticus-baumannii NOT DETECTED NOT DETECTED  ? Bacteroides fragilis NOT DETECTED NOT DETECTED  ? Enterobacterales DETECTED (A) NOT DETECTED  ? Enterobacter cloacae complex NOT DETECTED NOT DETECTED  ? Escherichia coli NOT DETECTED  NOT DETECTED  ? Klebsiella aerogenes NOT DETECTED NOT DETECTED  ? Klebsiella oxytoca NOT DETECTED NOT DETECTED  ? Klebsiella pneumoniae DETECTED (A) NOT DETECTED  ? Proteus species NOT DETECTED NOT DETECTED  ? Salmonella species NOT DETECTED NOT DETECTED  ? Serratia marcescens NOT DETECTED NOT DETECTED  ? Haemophilus influenzae NOT DETECTED NOT DETECTED  ? Neisseria meningitidis NOT DETECTED NOT DETECTED  ? Pseudomonas aeruginosa NOT DETECTED NOT DETECTED  ? Stenotrophomonas maltophilia NOT DETECTED NOT DETECTED  ? Candida albicans NOT DETECTED NOT DETECTED  ? Candida auris NOT DETECTED NOT DETECTED  ? Candida glabrata NOT DETECTED NOT DETECTED  ? Candida krusei NOT DETECTED NOT DETECTED  ? Candida parapsilosis NOT DETECTED NOT DETECTED  ? Candida tropicalis NOT DETECTED NOT DETECTED  ? Cryptococcus neoformans/gattii NOT DETECTED NOT DETECTED  ? CTX-M ESBL DETECTED (A) NOT DETECTED  ? Carbapenem resistance IMP NOT DETECTED NOT DETECTED  ? Carbapenem resistance KPC DETECTED (A) NOT DETECTED  ? Carbapenem resistance NDM NOT DETECTED NOT DETECTED  ? Carbapenem resist OXA 48 LIKE NOT DETECTED NOT DETECTED  ? Carbapenem resistance VIM NOT DETECTED NOT DETECTED  ? ? ?Shauna Hugh, PharmD, RPh  ?PGY-2 Pharmacy Resident ?05/16/2021 9:39 AM ? ?Please check [email or web address] for unit-specific pharmacy phone numbers. ? ? ?

## 2021-05-16 NOTE — Consult Note (Addendum)
Renal Service Consult Note [facility]  [place] 05/16/2021 Maree Krabbe, MD Requesting Physician: Dr. Tyson Babinski  Reason for Consult: ESRD pt w/ infected L AKA stump w/ bacteremia, AMS HPI: The patient is a 51 y.o. 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 Pt was sent from dialysis for worsening AMS during the HD session (1.5 hrs into Rx). Also BP's were low. HD is TTS. In ED BP 92/67, HR 131, alb 1.7,  WBC 28. Pt was admitted and started on IV abx. Seen by VVS who noted purulence at the L AKA suture line and they opened the wound at bedside. ID consulted today recommending IV ceftaz/ avibactam for complicated bacteremia > 1 organism. Asked to see for ESRD.    Pt seen in room on 3W.  Pt is stuporous and perseverating, keeps saying "Bilton, bilton, bilton".  No hx obtained.   ROS - n/a  Past Medical History  Past Medical History:  Diagnosis Date   Anemia    Anxiety    Blood transfusion without reported diagnosis    CHF (congestive heart failure) (HCC)    a. EF 25% by echo in 07/2019   Chronic kidney disease    Depression    Diabetes mellitus without complication (HCC)    End stage renal disease (HCC)    M/W/F [facility]   Hyperlipidemia    Neuropathy    Peripheral vascular disease (HCC)    PTSD (post-traumatic stress disorder)    Past Surgical History  Past Surgical History:  Procedure Laterality Date   A/V FISTULAGRAM N/A 10/09/2018   Procedure: A/V FISTULAGRAM;  Surgeon: Nada Libman, MD;  Location: [facility] INVASIVE CV LAB;  Service: Cardiovascular;  Laterality: N/A;   AMPUTATION Left 02/16/2021   Procedure: AMPUTATION BELOW KNEE;  Surgeon: Victorino Sparrow, MD;  Location: [facility] OR;  Service: Vascular;  Laterality: Left;   AMPUTATION Left 04/22/2021   Procedure: LEFT ABOVE KNEE AMPUTATION;  Surgeon: Victorino Sparrow, MD;  Location: [facility] OR;  Service: Vascular;  Laterality: Left;   AV FISTULA PLACEMENT Left 09/22/2016    Procedure: CREATION OF LEFT ARM ARTERIOVENOUS (AV) FISTULA;  Surgeon: Maeola Harman, MD;  Location: [facility] OR;  Service: Vascular;  Laterality: Left;   AV FISTULA PLACEMENT Left 10/31/2017   Procedure: INSERTION OF ARTERIOVENOUS (AV) GORE-TEX 4-16mm STETCH GRAFT LEFT ARM;  Surgeon: Maeola Harman, MD;  Location: [facility] OR;  Service: Vascular;  Laterality: Left;   BASCILIC VEIN TRANSPOSITION Left 08/17/2017   Procedure: SECOND STAGE BASILIC VEIN TRANSPOSITION LEFT ARM;  Surgeon: Maeola Harman, MD;  Location: [facility] OR;  Service: Vascular;  Laterality: Left;   BELOW KNEE LEG AMPUTATION Right    CARDIOVERSION N/A 02/19/2019   Procedure: CARDIOVERSION;  Surgeon: Chrystie Nose, MD;  Location: [facility] ENDOSCOPY;  Service: Cardiovascular;  Laterality: N/A;   CATARACT EXTRACTION W/PHACO Right 10/23/2020   Procedure: CATARACT EXTRACTION PHACO AND INTRAOCULAR LENS PLACEMENT (IOC);  Surgeon: Fabio Pierce, MD;  Location: [facility] ORS;  Service: Ophthalmology;  Laterality: Right;  CDE 39.03   CHOLECYSTECTOMY     FOOT SURGERY     IR FLUORO GUIDE CV LINE RIGHT  05/16/2016   IR FLUORO GUIDE CV LINE RIGHT  02/16/2019   IR REMOVAL TUN CV CATH W/O FL  05/16/2016   IR REMOVAL TUN CV CATH W/O FL  02/19/2019   IR THROMBECTOMY AV FISTULA W/THROMBOLYSIS/PTA INC/SHUNT/IMG LEFT Left 11/09/2018  IR THROMBECTOMY AV FISTULA W/THROMBOLYSIS/PTA INC/SHUNT/IMG LEFT Left 06/22/2019   IR US GUIDE VASC ACCESS LEFT  11/09/2018   IR US GUIDE VASC ACCESS LEFT  06/22/2019   IR US GUIDE VASC ACCESS RIGHT  05/16/2016   IR US GUIDE VASC ACCESS RIGHT  02/16/2019   PERIPHERAL VASCULAR BALLOON ANGIOPLASTY  10/09/2018   Procedure: PERIPHERAL VASCULAR BALLOON ANGIOPLASTY;  Surgeon: Nada Libman, MD;  Location: [facility] INVAS[facility] CV LAB;  Service: Cardiovascular;;   TEE WITHOUT CARDIOVERSION N/A 02/19/2019   Procedure: TRANSESOPHAGEAL ECHOCARDIOGRAM (TEE);  Surgeon: Chrystie Nose, MD;  Location[facility] ENDOSCOPY;  Service: Cardiovascular;   Laterality: N/A;   WOUND DEBRIDEMENT Right 04/22/2021   Procedure: DEBRIDEMENT WOUND;  Surgeon: Victorino Sparrow, MD;  Location: [facility] OR;  Service: Vascular;  Laterality: Right;   Family History  Family History  Problem Relation Age of Onset   Cancer Mother        lung   Diabetes Mother    Heart attack Father    Diabetes Father    Diabetes Sister    Social History  reports that he quit smoking about 14 years ago. His smoking use included cigarettes. He has never used smokeless tobacco. He reports that he does not drink alcohol and does not use drugs. Allergies  Allergies  Allergen Reactions   Tape Other (See Comments)    Pulls off the skin   Chlorhexidine Gluconate [Chlorhexidine] Rash   Home medications Prior to Admission medications   Medication Sig Start Date End Date Taking? Authorizing Provider  amiodarone (PACERONE) 200 MG tablet Take 200 mg by mouth daily. 03/29/21  Yes [provider]  apixaban (ELIQUIS) 5 MG TABS tablet Take 1 tablet (5 mg total) by mouth 2 (two) times daily. 08/23/19  Yes Strader, [place] M, PA-C  atorvastatin (LIPITOR) 10 MG tablet Take 1 tablet (10 mg total) by mouth every evening. Patient taking differently: Take 10 mg by mouth daily. 08/03/19  Yes Vassie Loll, MD  insulin glargine (LANTUS) 100 UNIT/ML injection Inject 10 Units into the skin at bedtime as needed (high blood sugar).   Yes [provider]  pantoprazole (PROTONIX) 40 MG tablet Take 40 mg by mouth daily. 03/23/21  Yes [provider]  acetaminophen (TYLENOL) 325 MG tablet Take 2 tablets (650 mg total) by mouth every 6 (six) hours as needed for mild pain, moderate pain or fever. Patient not taking: Reported on 05/16/2021 02/06/21   Lonia Blood, MD  albuterol (VENTOLIN HFA) 108 (90 Base) MCG/ACT inhaler Inhale 2 puffs into the lungs every 4 (four) hours as needed for wheezing or shortness of breath. Patient not taking: Reported on 05/16/2021 07/21/20   Maurilio Lovely  D, DO  ascorbic acid (VITAMIN C) 250 MG tablet Take 1 tablet (250 mg total) by mouth 2 (two) times daily. Patient not taking: Reported on 05/16/2021 05/01/21 05/31/21  Arrien, York Ram, MD  aspirin 81 MG chewable tablet Chew 81 mg by mouth daily. Patient not taking: Reported on 05/16/2021    [provider]  butalbital-acetaminophen-caffeine (FIORICET) 50-325-40 MG tablet Take 1 tablet by mouth every 6 (six) hours as needed for headache or migraine. Patient not taking: Reported on 05/16/2021 07/21/20   Maurilio Lovely D, DO  calcium acetate (PHOSLO) 667 MG capsule Take 1 capsule (667 mg total) by mouth 3 (three) times daily with meals. Patient not taking: Reported on 05/16/2021 02/21/19   Reva Bores, MD  calcium carbonate (TUMS - DOSED IN MG ELEMENTAL CALCIUM) 500  MG chewable tablet Chew 4 tablets (800 mg of elemental calcium total) by mouth 2 (two) times daily. Patient not taking: Reported on 05/16/2021 08/03/19   Vassie Loll, MD  Cholecalciferol (VITAMIN D3) 25 MCG (1000 UT) CAPS Take 1 capsule by mouth See admin instructions. Take before Dialysis on Monday Wednesday and Fridays Patient not taking: Reported on 05/16/2021    [provider]  midodrine (PROAMATINE) 10 MG tablet Take 1 tablet (10 mg total) by mouth 2 (two) times daily with a meal. 05/01/21 05/31/21  Arrien, York Ram, MD  multivitamin (RENA-VIT) TABS tablet Take 1 tablet by mouth at bedtime. Patient not taking: Reported on 05/16/2021 05/01/21 05/31/21  Arrien, York Ram, MD  nutrition supplement, JUVEN, [employer]) PACK Take 1 packet by mouth 2 (two) times daily between meals. Patient not taking: Reported on 05/16/2021 05/01/21 05/31/21  Arrien, York Ram, MD  Nutritional Supplements (,FEEDING SUPPLEMENT, PROSOURCE PLUS) liquid Take 30 mLs by mouth 2 (two) times daily between meals. Patient not taking: Reported on 05/16/2021 05/01/21 05/31/21  Arrien, York Ram, MD  oxyCODONE 10 MG TABS Take 1 tablet (10 mg  total) by mouth every 6 (six) hours as needed for severe pain. Patient not taking: Reported on 05/16/2021 05/01/21   Arrien, York Ram, MD  tiZANidine (ZANAFLEX) 4 MG tablet Take 4 mg by mouth daily as needed. Patient not taking: Reported on 05/16/2021 03/23/21   [provider]     Vitals:   05/16/21 0413 05/16/21 6045 05/16/21 0812 05/16/21 1113  BP: (!) 108/50  124/89 106/66  Pulse: (!) 127  (!) 124 (!) 122  Resp: 14 12 15 15   Temp: 97.6 F (36.4 C)  97.7 F (36.5 C) 98 F (36.7 C)  TempSrc: Axillary  Oral Axillary  SpO2: 97% 99% 100% 96%  Weight:      Height:       Exam Gen lethargic, confused, perseverating, lying flat on his back No rash, cyanosis or gangrene Sclera anicteric, throat clear  No jvd or bruits Chest clear bilat to bases, no rales/ wheezing RRR no RG Abd soft ntnd no mass or ascites +bs GU normal male MS R BKA and L BKA, large area on L stump of skin necrosis w/ +odor Neuro is as above       Home meds include - amiodarone, eliquis, lipitor, protonix, fioricet, phoslo 1 ac tid, calc carbonate, midodrine 10 bid, rena vit, juven, oxycodone qid, prns/ vits/ supps     OP HD: [facility] T[facility]  4h  84.5kg  300/500  2/2.5 bath LUA AVG  Hep 2500  - 3/18 > hep B Ag neg and hep B Abs were +/ protective  - venofer 50 weekly  - mircera 50 ug q2, last 4/01, due 4/15  - last HD 4/08, came off at dry wt   Assessment/ Plan: L AKA stump infection w/ bacteremia - ID consulting, per pmd / ID AMS - multifactorial, ^WU9 w/ infection/ esrd Afib w/ RVR - on amio ESRD - on HD MWF. Last HD 4/08. Has not missed recently. Next HD tomorrow.  BP/volume - BP's low, not on BP lowering meds at home now. Cont midodrine. Mild- mod dependent edema of LE's but not sure pt will tolerate large UF, plan 1-2 L max UF w/ hd tomorrow.  Anemia ckd - Hb 8.3, last esa 4/01, due on 4/15 MBD ckd - CCa in range, add on phos. Cont binder if eating solid food GOC - will consult  palliative care. Prognosis  very poor per admit attending and per VVS. Not sure who is POA if anybody. Transition to comfort care should be considered.       Vinson Moselle  MD 05/16/2021, 1:55 PM Recent Labs  Lab June 06, 2021 1436 2021-06-06 2058  HGB 8.3*  --   ALBUMIN 1.7*  --   CALCIUM 7.8*  --   CREATININE 2.53* 2.81*  K 3.7  --

## 2021-05-16 NOTE — Progress Notes (Signed)
Phlebotomists having difficulties getting blood samples despite several attempts. MD made aware. ?

## 2021-05-16 NOTE — Consult Note (Signed)
?  [facility]  ? ? ? ? ? ?Reason for Consult: bacteremia    ?Referring Physician: Dr. Louanne Belton ? ?Principal Problem: ?  Encephalopathy acute ?Active Problems: ?  Type 2 diabetes mellitus with diabetic chronic kidney disease ([place]) ?  Hyperlipidemia ?  ESRD on hemodialysis ([place]) ?  Chronic atrial fibrillation (HCC) ?  CHF (congestive heart failure) ([place]) ?  Amputation stump infection ([place]) ?  Essential hypertension ?  Goals of care, counseling/discussion ? ? ? (feeding supplement) PROSource Plus  30 mL Oral BID BM  ? amiodarone  200 mg Oral Daily  ? aspirin  81 mg Oral Daily  ? atorvastatin  10 mg Oral QPM  ? calcium acetate  667 mg Oral TID WC  ? heparin  5,000 Units Subcutaneous Q8H  ? midodrine  10 mg Oral BID WC  ? nutrition supplement (JUVEN)  1 packet Oral BID BM  ? pantoprazole  40 mg Oral Daily  ? ? ?Recommendations: ?Ceftazidime/avibactam ?Isolation precautions ? ?Assessment: ?He has a wound infection of his left stump area but no concerns for deep infection or bone involvement on the CT scan.   ?Stump non-healing and likely to be a continued problem with an albumin of 1.7, poor nutrition.   ? ? ?Antibiotics: ?Vancomycin, cefepime + metronidazole > ceftazidime/avibactam ? ?HPI: Brendan Campbell is a 51 y.o. male with a history of ESRD on intermittent hemodialysis, heart failure, Afib and peripheral vascular disease with recent left BKA followed by AKA due to non-healing wound comes in from hemodialysis for hypotension.  Blood culture now with GNR in 1/4 bottles and Klebsiella on BCID with a Carbapenem resistant KPC gene detected.  Patient is altered, not understandable so no further history obtained from him.  ? ? ?Review of Systems: ? Unable to be assessed due to mental status ? ?Past Medical History:  ?Diagnosis Date  ? Anemia   ? Anxiety   ? Blood transfusion without reported diagnosis   ? CHF (congestive heart failure) ([place])   ? a. EF 25% by echo in 07/2019  ? Chronic kidney  disease   ? Depression   ? Diabetes mellitus without complication ([place])   ? End stage renal disease ([place])   ? M/W/F [name]  ? Hyperlipidemia   ? Neuropathy   ? Peripheral vascular disease ([place])   ? PTSD (post-traumatic stress disorder)   ? ? ?Social History  ? ?Tobacco Use  ? Smoking status: Former  ?  Types: Cigarettes  ?  Quit date: 09/03/2006  ?  Years since quitting: 14.7  ? Smokeless tobacco: Never  ?Vaping Use  ? Vaping Use: Never used  ?Substance Use Topics  ? Alcohol use: No  ? Drug use: No  ? ? ?Family History  ?Problem Relation Age of Onset  ? Cancer Mother   ?     lung  ? Diabetes Mother   ? Heart attack Father   ? Diabetes Father   ? Diabetes Sister   ? ? ?Allergies  ?Allergen Reactions  ? Tape Other (See Comments)  ?  Pulls off the skin  ? Chlorhexidine Gluconate [Chlorhexidine] Rash  ? ? ?Physical Exam: ?Constitutional: nad ?Vitals:  ? 05/16/21 0627 05/16/21 9794  ?BP:  124/89  ?Pulse:  (!) 124  ?Resp: 12 15  ?Temp:  97.7 ?F (36.5 ?C)  ?SpO2: 99% 100%  ? ?EYES: anicteric ?Cardiovascular: tachy rr ?Respiratory: normal respiratory effort ?Skin: no rash ?Neuro; alert, confused ? ?Lab Results  ?  Component Value Date  ? WBC 27.9 (H) 05/23/2021  ? HGB 8.3 (L) 05/08/2021  ? HCT 23.4 (L) 05/27/2021  ? MCV 102.2 (H) 05/17/2021  ? PLT 242 05/29/2021  ?  ?Lab Results  ?Component Value Date  ? CREATININE 2.81 (H) 05/23/2021  ? BUN 12 05/26/2021  ? NA 137 05/14/2021  ? K 3.7 05/24/2021  ? CL 100 05/30/2021  ? CO2 26 05/26/2021  ?  ?Lab Results  ?Component Value Date  ? ALT 17 05/23/2021  ? AST 25 06/04/2021  ? ALKPHOS 155 (H) 05/14/2021  ?  ? ?Microbiology: ?Recent Results (from the past 240 hour(s))  ?Culture, blood (Routine X 2) w Reflex to ID Panel     Status: None (Preliminary result)  ? Collection Time: 05/09/2021  3:07 PM  ? Specimen: BLOOD RIGHT HAND  ?Result Value Ref Range Status  ? Specimen Description BLOOD RIGHT HAND  Final  ? Special Requests   Final  ?  BOTTLES DRAWN AEROBIC AND ANAEROBIC Blood  Culture adequate volume  ? Culture  Setup Time   Final  ?  GRAM NEGATIVE RODS ?AEROBIC BOTTLE ONLY ?CRITICAL RESULT CALLED TO, READ BACK BY AND VERIFIED WITH: Vena Austria S.GRUBER ON [id number] AT 0830 BY E.PARRISH ?Performed at [facility], [place] [address] [place], [place] [address] ?  ? Culture GRAM NEGATIVE RODS  Final  ? Report Status PENDING  Incomplete  ?Blood Culture ID Panel (Reflexed)     Status: Abnormal  ? Collection Time: 06/02/2021  3:07 PM  ?Result Value Ref Range Status  ? Enterococcus faecalis NOT DETECTED NOT DETECTED Final  ? Enterococcus Faecium NOT DETECTED NOT DETECTED Final  ? Listeria monocytogenes NOT DETECTED NOT DETECTED Final  ? Staphylococcus species NOT DETECTED NOT DETECTED Final  ? Staphylococcus aureus (BCID) NOT DETECTED NOT DETECTED Final  ? Staphylococcus epidermidis NOT DETECTED NOT DETECTED Final  ? Staphylococcus lugdunensis NOT DETECTED NOT DETECTED Final  ? Streptococcus species NOT DETECTED NOT DETECTED Final  ? Streptococcus agalactiae NOT DETECTED NOT DETECTED Final  ? Streptococcus pneumoniae NOT DETECTED NOT DETECTED Final  ? Streptococcus pyogenes NOT DETECTED NOT DETECTED Final  ? A.calcoaceticus-baumannii NOT DETECTED NOT DETECTED Final  ? Bacteroides fragilis NOT DETECTED NOT DETECTED Final  ? Enterobacterales DETECTED (A) NOT DETECTED Final  ?  Comment: Enterobacterales represent a large order of gram negative bacteria, not a single organism. ?CRITICAL RESULT CALLED TO, READ BACK BY AND VERIFIED WITH: ?Vena Austria S.GRUBER ON [id number] AT 0830 BY E.PARRISH ?  ? Enterobacter cloacae complex NOT DETECTED NOT DETECTED Final  ? Escherichia coli NOT DETECTED NOT DETECTED Final  ? Klebsiella aerogenes NOT DETECTED NOT DETECTED Final  ? Klebsiella oxytoca NOT DETECTED NOT DETECTED Final  ? Klebsiella pneumoniae DETECTED (A) NOT DETECTED Final  ?  Comment: CRITICAL RESULT CALLED TO, READ BACK BY AND VERIFIED WITH: ?Vena Austria S.GRUBER ON [id number] AT 0830 BY E.PARRISH ?  ? Proteus  species NOT DETECTED NOT DETECTED Final  ? Salmonella species NOT DETECTED NOT DETECTED Final  ? Serratia marcescens NOT DETECTED NOT DETECTED Final  ? Haemophilus influenzae NOT DETECTED NOT DETECTED Final  ? Neisseria meningitidis NOT DETECTED NOT DETECTED Final  ? Pseudomonas aeruginosa NOT DETECTED NOT DETECTED Final  ? Stenotrophomonas maltophilia NOT DETECTED NOT DETECTED Final  ? Candida albicans NOT DETECTED NOT DETECTED Final  ? Candida auris NOT DETECTED NOT DETECTED Final  ? Candida glabrata NOT DETECTED NOT DETECTED Final  ? Candida krusei NOT DETECTED NOT DETECTED  Final  ? Candida parapsilosis NOT DETECTED NOT DETECTED Final  ? Candida tropicalis NOT DETECTED NOT DETECTED Final  ? Cryptococcus neoformans/gattii NOT DETECTED NOT DETECTED Final  ? CTX-M ESBL DETECTED (A) NOT DETECTED Final  ?  Comment: CRITICAL RESULT CALLED TO, READ BACK BY AND VERIFIED WITH: ?PHARM D S.GRUBER ON [id number] AT 0830 BY E.PARRISH ?(NOTE) ?Extended spectrum beta-lactamase detected. Recommend a carbapenem as ?initial therapy. ? ? ?  ? Carbapenem resistance IMP NOT DETECTED NOT DETECTED Final  ? Carbapenem resistance KPC DETECTED (A) NOT DETECTED Final  ?  Comment: CRITICAL RESULT CALLED TO, READ BACK BY AND VERIFIED WITH: ?Vena Austria S.GRUBER ON [id number] AT 0830 BY E.PARRISH ?(NOTE) ?Carbapenemase resistant enterobacterales detected. Recommend ?ceftazidime/avibactam as intitial therapy. ? ?  ? Carbapenem resistance NDM NOT DETECTED NOT DETECTED Final  ? Carbapenem resist OXA 48 LIKE NOT DETECTED NOT DETECTED Final  ? Carbapenem resistance VIM NOT DETECTED NOT DETECTED Final  ?  Comment: Performed at [facility], [place] [address], [place],  [address]  ?Resp Panel by RT-PCR (Flu A&B, Covid) Nasopharyngeal Swab     Status: None  ? Collection Time: 05/09/2021  5:03 PM  ? Specimen: Nasopharyngeal Swab; Nasopharyngeal(NP) swabs in vial transport medium  ?Result Value Ref Range Status  ? SARS Coronavirus 2 by RT PCR NEGATIVE  NEGATIVE Final  ?  Comment: (NOTE) ?SARS-CoV-2 target nucleic acids are NOT DETECTED. ? ?The SARS-CoV-2 RNA is generally detectable in upper respiratory ?specimens during the acute phase of infection. The lowest ?

## 2021-05-16 NOTE — Progress Notes (Signed)
Pt arrived to unit in RED MEWS from ED for sepsis.  ?Pt has multiple bleeding wounds: L incision site LAKA-partially stapled with gauze packing and actively bleeding,as well as the buttocks, scrotum, L arm. Large number of scattered bleeding wounds on buttocks where skin is missing. Pt has large healing ulcer w eschar to sacrum. Pt had large BM. Pt reached around to his buttocks, grabbed feces and began smearing feces on his chest, face and play with it between his fingers and the bed rail. Pt cleaned from fecal matter, all wounds cleansed and silicone dressings applied to all sites. L AV fistula site bleeding, dressing changed. ?   MD notified of low BP and Afib RVR. MD reviewed chart and ordered LR bolus, Digoxin and labs.  Pt denied CP/SOB. Pt c/o pain to buttocks. ?

## 2021-05-16 NOTE — Progress Notes (Signed)
?  Daily Progress Note ? ?S/p: Left above-knee amputation, for wound healing, now infected with bacteremia ? ?Subjective: ?Encephalopathic ? ?Objective: ?Vitals:  ? 05/16/21 7591 05/16/21 1113  ?BP: 124/89 106/66  ?Pulse: (!) 124 (!) 122  ?Resp: 15 15  ?Temp: 97.7 ?F (36.5 ?C) 98 ?F (36.7 ?C)  ?SpO2: 100% 96%  ?  ?Physical Examination ?Left AKA dressing removed, suture line opened further.  No more purulence appreciated, Nischal necrotic, devitalized tissue appreciated throughout the wound bed.  This was packed with dry gauze.  Betadine ordered for tomorrow's dressing change. ? ?ASSESSMENT/PLAN:  ?Brendan Campbell is a 51 y.o. male who presents from dialysis after hypotensive episode with significant encephalopathy and nonhealing left AKA.   ? ?The wound was opened at bedside yesterday, and further opened today.  Devitalized tissue appreciated throughout the wound bed.  No further tracking. ? ?As stated earlier, prior to discharge, I had significant concerns regarding wound breakdown due to the patient's failure to thrive and poor nutritional status.  Unfortunately, I am not surprised to see him in the emergency department.  With his current encephalopathy, he is unable to make decisions for himself.  His only contact is a girlfriend, however I am unsure as to their involvement.  He will need formal debridement and revision the coming days, however I am concerned with his poor nutritional status, that any amputation revision will have a similar outcome. ? ?[name] will need his wound normally addressed at some point.  I am unsure how to navigate this as he has no next of kin, nor proxy.  I spoke to his hospital contact, his roommate, Kathlee Nations.  I had a long conversation with her and asked if they had had any conversations regarding end-of-life care, planning.  They have not had any.  ? ?I discussed the above with Dr.Pokhrel.  I am hoping his encephalopathy improves with opening the wound bed accompanied with broad-spectrum  antibiotics. ? ?Should his encephalopathy be present tomorrow, I will involve hospital ethics regarding care moving forward. ? ? ?[name] MD MS ?[facility] ?[phone] ?05/16/2021  ?1:14 PM ? ?

## 2021-05-16 NOTE — Consult Note (Signed)
? ?                                                                                ?Consultation Note ?Date: 05/16/2021  ? ?Patient Name: Brendan Campbell  ?DOB: [birth year]  MRN: [id number]  Age / Sex: 51 y.o., male  ?PCP: Monico Blitz, MD ?Referring Physician: Flora Lipps, MD ? ?Reason for Consultation: Establishing goals of care ? ?HPI/Patient Profile: 51 y.o. male  with past medical history of anemia, HFrEF (EF 25%), atrial fibrillation, ESRD on HD (frequently misses treatments), depression, PTSD, diabetes, HLD, PAD s/p R BKA and recent L AKA (complicated by infection leading to AKA revision), s/p surgical debridement L AKA and R BKA 04/27/21 with superficial necrosis admitted on 05/24/2021 with hypotension and hypoglycemia from dialysis center. Hospitalization complicated by infection amputation stump, bacteremia. Overall prognosis poor with nonhealing left AKA and overall failure to thrive. Palliative care requested to assist with goals of care although seems to lack of decision maker.  ? ?Clinical Assessment and Goals of Care: ?I have reviewed including notes (from this admission and previous), labs/diagnostics, vital signs. I met with Brendan Campbell and he is lying in bed with mittens on. He appears very ill. His eyes are open. He does not initially respond to me and when I ask "can you speak to me?" - he does turn towards me and says "I'm sorry." I expressed to him that he is very sick. I asked who I should call to let know about his illness and he does not respond; I ask - "Kathlee Nations?" but he nods his head no. After this he continues to repeat "whooo!" No other response was able to be elicited.  ? ?I do see note from my palliative colleague Quinn Axe) 03/11/21 in which Brendan Campbell indicated full code, full scope care and declined to name surrogate. It was noted that he did not wish to name emergency contact Noberto Retort his [facility] and did not wish for estranged siblings to make decisions. No other family.  Brendan Campbell was unable  to place any limitations for quality of life at that time but open to all measures to keep him alive indefinately. I see no other contact information for anyone other than Pleas Koch (same in [place]). Noted that Brendan Campbell agreed to outpatient palliative services and was referred to [facility] - I have reached out to them for any more information available if they were able to follow up with him (awaiting return call).  ? ?Noted that Dr. Virl Cagey spoke with emergency contact Pleas Koch who shared that she had no end of life conversation with Brendan Campbell. He previously declined to make her HCPOA when given the opportunity. At this stage there seems to be lack of surrogate decision maker. Due diligence to be made to locate siblings if he is unable to speak for himself. I did call Pleas Koch (to see if she had any contacts for any family and obtain collateral information) but no answer and no option to leave voicemail. I may try again tomorrow.  ? ?[place] Right to Natural Death Policy: ?"3. The withdrawal or withholding of life-prolonging measures must be concurred with by  the person or persons authorized to speak for the patient. Under [facility] law, the order of preference is: ?- legal guardian, unless the patient has a valid health care power of attorney in place, or ?- a health care agent appointed under a valid health care power of attorney, or ?- durable power of attorney who has been given healthcare decision-making authority, or ?- spouse, or ?- a 50 of reasonably available parents and children who are at least 49 years of age, or ?- a 24 of reasonably available adult siblings, or ?- an individual with an established relationship with the patient who is acting in good faith on behalf of the patient and who can reliably convey the patient?s wishes. ?4. If none of the above is available, then at the discretion of the attending physician, life-prolonging measures may be  withdrawn or withheld under the attending physician?s direction and supervision. ?5. If no consensus can be reached, consultation with the Ethics Committee is advisable." ? ?Primary Decision Maker ?No clear decision makers. Siblings but unknown contact info. Decisions may need to be made by the attending and medical team in absence of surrogate decision maker.  ?  ? ?SUMMARY OF RECOMMENDATIONS   ?- Watchful waiting if patient can speak for himself ?- No clear decision maker at this time ?- Comfort care and DNR would be appropriate at this time given health status and ongoing decline and complications ? ?Code Status/Advance Care Planning: ?Full code ? ? ?Symptom Management:  ?Per attending, renal, vascular.  ? ?Palliative Prophylaxis:  ?Aspiration, delirium ? ?Prognosis:  ?Overall prognosis very poor regardless of aggressiveness of care moving forward.  ? ?Discharge Planning: To Be Determined  ? ?  ? ?Primary Diagnoses: ?Present on Admission: ? Amputation stump infection ([place]) ? Type 2 diabetes mellitus with diabetic chronic kidney disease ([place]) ? Hyperlipidemia ? Chronic atrial fibrillation (HCC) ? Essential hypertension ? Encephalopathy acute ? ? ?I have reviewed the medical record, interviewed the patient and family, and examined the patient. The following aspects are pertinent. ? ?Past Medical History:  ?Diagnosis Date  ? Anemia   ? Anxiety   ? Blood transfusion without reported diagnosis   ? CHF (congestive heart failure) ([place])   ? a. EF 25% by echo in 07/2019  ? Chronic kidney disease   ? Depression   ? Diabetes mellitus without complication ([place])   ? End stage renal disease ([place])   ? M/W/F [name]  ? Hyperlipidemia   ? Neuropathy   ? Peripheral vascular disease ([place])   ? PTSD (post-traumatic stress disorder)   ? ?Social History  ? ?Socioeconomic History  ? Marital status: Single  ?  Spouse name: Not on file  ? Number of children: Not on file  ? Years of education: Not on file  ? Highest education level:  Not on file  ?Occupational History  ? Not on file  ?Tobacco Use  ? Smoking status: Former  ?  Types: Cigarettes  ?  Quit date: 09/03/2006  ?  Years since quitting: 14.7  ? Smokeless tobacco: Never  ?Vaping Use  ? Vaping Use: Never used  ?Substance and Sexual Activity  ? Alcohol use: No  ? Drug use: No  ? Sexual activity: Not Currently  ?Other Topics Concern  ? Not on file  ?Social History Narrative  ? Pt lives in [place] with roommate.  Not followed by an outpatient provider currently, but is trying to schedule an appointment with [facility] Ledger.  ? ?Social  Determinants of Health  ? ?Financial Resource Strain: Not on file  ?Food Insecurity: Not on file  ?Transportation Needs: Not on file  ?Physical Activity: Not on file  ?Stress: Not on file  ?Social Connections: Not on file  ? ?Family History  ?Problem Relation Age of Onset  ? Cancer Mother   ?     lung  ? Diabetes Mother   ? Heart attack Father   ? Diabetes Father   ? Diabetes Sister   ? ?Scheduled Meds: ? (feeding supplement) PROSource Plus  30 mL Oral BID BM  ? amiodarone  200 mg Oral Daily  ? aspirin  81 mg Oral Daily  ? atorvastatin  10 mg Oral QPM  ? calcium acetate  667 mg Oral TID WC  ? [START ON 05/17/2021] Chlorhexidine Gluconate Cloth  6 each Topical Q0600  ? heparin  5,000 Units Subcutaneous Q8H  ? midodrine  10 mg Oral BID WC  ? nutrition supplement (JUVEN)  1 packet Oral BID BM  ? pantoprazole  40 mg Oral Daily  ? ?Continuous Infusions: ? ceftazidime avibactam (AVYCAZ) IVPB 0.94 g (05/16/21 1246)  ? ?PRN Meds:.acetaminophen, albuterol, butalbital-acetaminophen-caffeine, lip balm, oxyCODONE, povidone-iodine ?Allergies  ?Allergen Reactions  ? Tape Other (See Comments)  ?  Pulls off the skin  ? Chlorhexidine Gluconate [Chlorhexidine] Rash  ? ?Review of Systems  ?Unable to perform ROS: Acuity of condition  ? ?Physical Exam ?Vitals and nursing note reviewed.  ?Constitutional:   ?   Appearance: He is ill-appearing.  ?Cardiovascular:  ?   Rate and  Rhythm: Tachycardia present.  ?Pulmonary:  ?   Effort: No tachypnea, accessory muscle usage or respiratory distress.  ?Abdominal:  ?   Palpations: Abdomen is soft.  ?Neurological:  ?   Mental Status: He is leth

## 2021-05-16 NOTE — Progress Notes (Signed)
?PROGRESS NOTE ? ? ? ?EMMETT BRACKNELL  FIE:[id number] DOB: 09/24/1970 DOA: 05/24/2021 ?PCP: Monico Blitz, MD  ? ? ?Brief Narrative:  ?Mr. Shibata is a 51 years old male with history of end-stage renal disease on hemodialysis, heart failure with reduced ejection fraction, diabetes mellitus type 2, chronic atrial fibrillation with bilateral lower extremity amputation and recent left below-knee amputation complicated by infection leading to AKA revision with ICU stay for hypotension presented to hospital after episode of hypotension during hemodialysis and was noted to be hypothermic as well.  In the ED, patient was mildly hypotensive and tachycardic with blood glucose level was low at 46.  Ammonia elevated at 107.  Lactate at 1.7.  WBC elevated at 27.9.  Chest x-ray showed interstitial edema.  Code sepsis was initiated and patient was started on antibiotic.  Patient was then admitted to hospital for further evaluation and treatment. ? ?Assessment and Plan: ? ?Acute metabolic encephalopathy  ?Likely secondary to sepsis.  Continue antibiotic and supportive care.  Ammonia was elevated in hospital.  We will continue with lactulose for now.  Bilirubin was elevated but AST ALT within normal range. ?  ?Sepsis secondary to amputation stump infection ([place]) ?Left above-knee amputation stump infection with purulent drainage and odor on presentation with leukocytosis.  Patient was tachycardic tachypneic and hypothermic on presentation.  Patient was initiated on code sepsis and antibiotics vancomycin Flagyl and imipenem were initiated.  Dr. Unk Lightning vascular surgery was informed from the ED for follow-up.  Procalcitonin was 7.0.  COVID and influenza was negative. ?Blood cultures showing gram-negative rod at this time.  We will continue to follow.  Significant leukocytosis present.  Plan for CTA of the left lower extremity for vascular evaluation as per vascular surgery. ? ?Gram-negative rod bacteremia.  BCID with Klebsiella  pneumonia,CTX-M ESBL, and KPC detected.  Pharmacy on board.  Has been started on Avycaz.  ID has been consulted. ? ?Atrial fibrillation with rapid ventricular response with history of chronic atrial fibrillation..  Patient had borderline hypotension.  Received IV digoxin.  Already on amiodarone. ?  ?CHF (congestive heart failure) ([place]) ?Patient had missed dialysis.  Resume dialysis while in the hospital. ? ?Essential hypertension ?Upon admission secondary to sepsis.  Blood pressure is slightly improved at this time. ?  ?Chronic atrial fibrillation ([place]) ?On amiodarone ?  ?ESRD on hemodialysis ([place]) ?Could not complete hemodialysis session due to hypotension.  Consult nephrology for hemodialysis needs.  On midodrine as outpatient.  We will continue. ? ?Hyperlipidemia ?Continue Lipitor. ?  ?Type 2 diabetes mellitus with diabetic chronic kidney disease ([place]) ?Continue sliding scale insulin Accu-Cheks diabetic diet. ? ?Goals of care, counseling/discussion ?Patient with multiple comorbidities and poor prognosis.  We will consult palliative care for ongoing goals of care.  High possibility of nonhealing AKA stump.   ? ?  ? DVT prophylaxis: heparin injection 5,000 Units Start: 05/20/2021 2200 ? ? ?Code Status:   ?  Code Status: Full Code ? ?Disposition: Home ? ?Status is: Inpatient ? ?Remains inpatient appropriate because: Pending medical improvement, metabolic encephalopathy, sepsis, IV antibiotics, ? ? Family Communication: None ? ?Consultants:  ?Infectious disease ?Nephrology ?Vascular surgery ? ?Procedures:  ?None ? ?Antimicrobials:  ?Avycaz ? ?Anti-infectives (From admission, onward)  ? ? Start     Dose/Rate Route Frequency Ordered Stop  ? 05/16/21 2200  ceFEPIme (MAXIPIME) 1 g in sodium chloride 0.9 % 100 mL IVPB  Status:  Discontinued       ? 1 g ?200 mL/hr over 30 Minutes  Intravenous Every 24 hours 05/16/21 0006 05/16/21 0859  ? 05/16/21 1200  ceftazidime-avibactam (AVYCAZ) 0.94 g in sodium chloride 0.9 % 50 mL IVPB        ? 0.94 g ?25 mL/hr over 2 Hours Intravenous Every 24 hours 05/16/21 0912    ? 05/16/21 1000  ceftazidime-avibactam (AVYCAZ) 0.94 g in sodium chloride 0.9 % 50 mL IVPB  Status:  Discontinued       ? 0.94 g ?25 mL/hr over 2 Hours Intravenous Every 24 hours 05/16/21 0900 05/16/21 0912  ? 05/16/21 0500  metroNIDAZOLE (FLAGYL) IVPB 500 mg  Status:  Discontinued       ? 500 mg ?100 mL/hr over 60 Minutes Intravenous Every 12 hours 05/29/2021 2348 05/16/21 1059  ? 05/16/21 0100  vancomycin (VANCOREADY) IVPB 500 mg/100 mL       ?Note to Pharmacy: Previous 1750 mg bag that was infusing had ~ 100-144mL leak  ? 500 mg ?100 mL/hr over 60 Minutes Intravenous  Once 05/16/21 0005 05/16/21 0812  ? 05/18/2021 1715  vancomycin (VANCOREADY) IVPB 1750 mg/350 mL       ? 1,750 mg ?175 mL/hr over 120 Minutes Intravenous  Once 05/19/2021 1705 05/17/2021 2250  ? 05/27/2021 1630  ceFEPIme (MAXIPIME) 2 g in sodium chloride 0.9 % 100 mL IVPB       ? 2 g ?200 mL/hr over 30 Minutes Intravenous  Once 05/27/2021 1625 05/28/2021 1838  ? 05/13/2021 1630  metroNIDAZOLE (FLAGYL) IVPB 500 mg       ? 500 mg ?100 mL/hr over 60 Minutes Intravenous  Once 06/06/2021 1625 06/02/2021 1838  ? 05/30/2021 1630  vancomycin (VANCOCIN) IVPB 1000 mg/200 mL premix  Status:  Discontinued       ? 1,000 mg ?200 mL/hr over 60 Minutes Intravenous  Once 05/31/2021 1625 05/31/2021 1705  ? ?  ? ? ?Subjective: ?Today, patient was seen and examined at bedside.  Patient is confused disoriented on mittens denies overt pain. ? ?Objective: ?Vitals:  ? 05/16/21 0413 05/16/21 1761 05/16/21 0812 05/16/21 1113  ?BP: (!) 108/50  124/89 106/66  ?Pulse: (!) 127  (!) 124 (!) 122  ?Resp: 14 12 15 15   ?Temp: 97.6 ?F (36.4 ?C)  97.7 ?F (36.5 ?C) 98 ?F (36.7 ?C)  ?TempSrc: Axillary  Oral Axillary  ?SpO2: 97% 99% 100% 96%  ?Weight:      ?Height:      ? ? ?Intake/Output Summary (Last 24 hours) at 05/16/2021 1119 ?Last data filed at 05/16/2021 0323 ?Gross per 24 hour  ?Intake 720 ml  ?Output --  ?Net 720 ml  ? ?Filed  Weights  ? 05/08/2021 2055  ?Weight: 81.5 kg  ? ? ?Physical Examination: ? ?Body mass index is 25.78 kg/m?.  ?General:  Average built, not in obvious distress, confused, disoriented ?HENT:   Pallor noted.  Oral mucosa is moist.  ?Chest:  Clear breath sounds.  Diminished breath sounds bilaterally. No crackles or wheezes.  ?CVS: S1 &S2 heard. No murmur.  Regular rate and rhythm. ?Abdomen: Soft, nontender, nondistended.  Bowel sounds are heard.   ?Extremities: Left above-knee amputation with blackish discoloration in dressing, right below-knee amputation, mittens bilaterally. ?Psych: Alert, awake and confused, disoriented. ?CNS:  No cranial nerve deficits.  Moves upper extremities ?Skin: Warm and dry.  No rashes noted. ? ?Data Reviewed:  ? ?CBC: ?Recent Labs  ?Lab 05/31/2021 ?1436  ?WBC 27.9*  ?HGB 8.3*  ?HCT 23.4*  ?MCV 102.2*  ?PLT 242  ? ? ?Basic Metabolic Panel: ?Recent Labs  ?  Lab 05/14/2021 ?1436 05/14/2021 ?2058  ?NA 137  --   ?K 3.7  --   ?CL 100  --   ?CO2 26  --   ?GLUCOSE 46*  --   ?BUN 12  --   ?CREATININE 2.53* 2.81*  ?CALCIUM 7.8*  --   ? ? ?Liver Function Tests: ?Recent Labs  ?Lab 05/29/2021 ?1436  ?AST 25  ?ALT 17  ?ALKPHOS 155*  ?BILITOT 2.3*  ?PROT 4.6*  ?ALBUMIN 1.7*  ? ? ? ?Radiology Studies: ?CT Head Wo Contrast ? ?Result Date: 06/05/2021 ?CLINICAL DATA:  Mental status change EXAM: CT HEAD WITHOUT CONTRAST TECHNIQUE: Contiguous axial images were obtained from the base of the skull through the vertex without intravenous contrast. RADIATION DOSE REDUCTION: This exam was performed according to the departmental dose-optimization program which includes automated exposure control, adjustment of the mA and/or kV according to patient size and/or use of iterative reconstruction technique. COMPARISON:  None. FINDINGS: Brain: No evidence of acute infarction, hemorrhage, hydrocephalus, extra-axial collection or mass lesion/mass effect. Periventricular and deep white matter hypodensity. Vascular: No hyperdense vessel or  unexpected calcification. Skull: Normal. Negative for fracture or focal lesion. Sinuses/Orbits: No acute finding. Other: None. IMPRESSION: No acute intracranial pathology. Small-vessel white matter disease. [employer]

## 2021-05-16 NOTE — Hospital Course (Addendum)
Mr. Laseter is a 51 years old male with history of end-stage renal disease on hemodialysis, heart failure with reduced ejection fraction, diabetes mellitus type 2, chronic atrial fibrillation with bilateral lower extremity amputation and recent left below-knee amputation complicated by infection leading to AKA revision with ICU stay for hypotension presented to hospital after episode of hypotension during hemodialysis and was noted to be hypothermic as well.  In the ED, patient was mildly hypotensive and tachycardic with blood glucose level was low at 46.  Ammonia elevated at 107.  Lactate at 1.7.  WBC elevated at 27.9.  Chest x-ray showed interstitial edema.  Code sepsis was initiated and patient was started on antibiotic.  Patient was then admitted to hospital for further evaluation and treatment. ? ?Assessment and Plan: ? ?Acute metabolic encephalopathy  ?Multifactorial from sepsis, hypoglycemia. Bilirubin was elevated but AST ALT within normal range.  Currently on comfort care. ?  ?Sepsis secondary to amputation stump infection ([place]) ?Carbapenem resistant Klebsiella pneumonia infection ?Left above-knee amputation stump infection with purulent drainage and odor on presentation with leukocytosis.  Patient was tachycardic, tachypneic and hypothermic on presentation.  Patient was initiated on code sepsis and antibiotics vancomycin Flagyl and imipenem were initially given.  Dr. Unk Lightning vascular surgery was informed as well. Procalcitonin was 7.0.  COVID and influenza was negative. ?Blood cultures showed  klebsiella pneumoniae, Carbapenem resistant..   Significant leukocytosis present. CTA of the left lower extremity with thrombosis of the distal SFA popliteal artery  trace foci of subcutaneous gas.   ? ?Atrial fibrillation with rapid ventricular response with history of chronic atrial fibrillation..  Patient had borderline hypotension.  Received IV digoxin.  Currently on comfort care. ?  ?CHF (congestive heart  failure) ([place]) ?Patient had missed dialysis.  Nephrology was initially consulted.  Currently on comfort care ? ?Essential hypertension ?Was hypotensive on admission.  Currently on comfort care. ? ?ESRD on hemodialysis ([place]) ?Currently on comfort care. ? ?Hyperlipidemia ?On comfort care. ?  ?Type 2 diabetes mellitus with diabetic chronic kidney disease ([place]) with hypoglycemia. ?Had episodes of hypoglycemia.  Currently on comfort care ? ?Goals of care, counseling/discussion ?Patient with multiple comorbidities and poor prognosis.  Palliative care on board and patient has been initiated on comfort care. ?

## 2021-05-16 NOTE — Progress Notes (Addendum)
Hypoglycemic Event ? ?CBG: 56 ? ?Treatment: D50 25 mL (12.5 gm) ? ?Symptoms:  lethargy, drowsiness ? ?Follow-up CBG: Time:1745 CBG Result:96 ? ?Possible Reasons for Event: Other: unable to eat due to AMS  ? ?Comments/MD notified:Pokhrel MD ? ? ? ? ? ? ?

## 2021-05-16 NOTE — Plan of Care (Signed)

## 2021-05-17 DIAGNOSIS — G9340 Encephalopathy, unspecified: Secondary | ICD-10-CM | POA: Diagnosis not present

## 2021-05-17 DIAGNOSIS — S31000A Unspecified open wound of lower back and pelvis without penetration into retroperitoneum, initial encounter: Secondary | ICD-10-CM | POA: Diagnosis not present

## 2021-05-17 DIAGNOSIS — T8740 Infection of amputation stump, unspecified extremity: Secondary | ICD-10-CM | POA: Diagnosis not present

## 2021-05-17 DIAGNOSIS — A4159 Other Gram-negative sepsis: Secondary | ICD-10-CM

## 2021-05-17 DIAGNOSIS — Z515 Encounter for palliative care: Secondary | ICD-10-CM

## 2021-05-17 DIAGNOSIS — I5042 Chronic combined systolic (congestive) and diastolic (congestive) heart failure: Secondary | ICD-10-CM | POA: Diagnosis not present

## 2021-05-17 DIAGNOSIS — Z66 Do not resuscitate: Secondary | ICD-10-CM

## 2021-05-17 DIAGNOSIS — Z1613 Resistance to carbapenem: Secondary | ICD-10-CM

## 2021-05-17 DIAGNOSIS — I482 Chronic atrial fibrillation, unspecified: Secondary | ICD-10-CM | POA: Diagnosis not present

## 2021-05-17 DIAGNOSIS — A4150 Gram-negative sepsis, unspecified: Secondary | ICD-10-CM | POA: Diagnosis not present

## 2021-05-17 DIAGNOSIS — A498 Other bacterial infections of unspecified site: Secondary | ICD-10-CM

## 2021-05-17 LAB — CBC
HCT: 25 % — ABNORMAL LOW (ref 39.0–52.0)
Hemoglobin: 8.7 g/dL — ABNORMAL LOW (ref 13.0–17.0)
MCH: 34.8 pg — ABNORMAL HIGH (ref 26.0–34.0)
MCHC: 34.8 g/dL (ref 30.0–36.0)
MCV: 100 fL (ref 80.0–100.0)
Platelets: 228 10*3/uL (ref 150–400)
RBC: 2.5 MIL/uL — ABNORMAL LOW (ref 4.22–5.81)
RDW: 20 % — ABNORMAL HIGH (ref 11.5–15.5)
WBC: 27 10*3/uL — ABNORMAL HIGH (ref 4.0–10.5)
nRBC: 0 % (ref 0.0–0.2)

## 2021-05-17 LAB — GLUCOSE, CAPILLARY
Glucose-Capillary: 76 mg/dL (ref 70–99)
Glucose-Capillary: 75 mg/dL (ref 70–99)
Glucose-Capillary: 75 mg/dL (ref 70–99)
Glucose-Capillary: 67 mg/dL — ABNORMAL LOW (ref 70–99)
Glucose-Capillary: 110 mg/dL — ABNORMAL HIGH (ref 70–99)

## 2021-05-17 LAB — PROCALCITONIN
Procalcitonin: 7.14 ng/mL
Procalcitonin: 6.78 ng/mL

## 2021-05-17 LAB — BASIC METABOLIC PANEL
Anion gap: 12 (ref 5–15)
BUN: 18 mg/dL (ref 6–20)
CO2: 25 mmol/L (ref 22–32)
Calcium: 8.2 mg/dL — ABNORMAL LOW (ref 8.9–10.3)
Chloride: 100 mmol/L (ref 98–111)
Creatinine, Ser: 3.39 mg/dL — ABNORMAL HIGH (ref 0.61–1.24)
GFR, Estimated: 21 mL/min — ABNORMAL LOW (ref >60)
Glucose, Bld: 72 mg/dL (ref 70–99)
Potassium: 4.4 mmol/L (ref 3.5–5.1)
Sodium: 137 mmol/L (ref 135–145)

## 2021-05-17 LAB — PHOSPHORUS: Phosphorus: 2 mg/dL — ABNORMAL LOW (ref 2.5–4.6)

## 2021-05-17 MED ORDER — SILVER SULFADIAZINE 1 % EX CREA
TOPICAL_CREAM | Freq: Every day | CUTANEOUS | Status: DC
  Filled 2021-05-17: qty 85

## 2021-05-17 MED ORDER — GLYCOPYRROLATE 1 MG PO TABS: 1.0000 mg | ORAL_TABLET | ORAL | Status: DC | PRN

## 2021-05-17 MED ORDER — LORAZEPAM 2 MG/ML IJ SOLN
1.0000 mg | INTRAMUSCULAR | Status: DC | PRN
  Administered 2021-05-19: 2 mg via INTRAVENOUS
  Filled 2021-05-17: qty 1

## 2021-05-17 MED ORDER — ONDANSETRON 4 MG PO TBDP: 4.0000 mg | ORAL_TABLET | Freq: Four times a day (QID) | ORAL | Status: DC | PRN

## 2021-05-17 MED ORDER — HALOPERIDOL LACTATE 2 MG/ML PO CONC
0.5000 mg | ORAL | Status: DC | PRN
  Filled 2021-05-17: qty 0.3

## 2021-05-17 MED ORDER — POLYVINYL ALCOHOL 1.4 % OP SOLN
1.0000 [drp] | Freq: Four times a day (QID) | OPHTHALMIC | Status: DC | PRN
  Filled 2021-05-17: qty 15

## 2021-05-17 MED ORDER — GLYCOPYRROLATE 0.2 MG/ML IJ SOLN: 0.2000 mg | INTRAMUSCULAR | Status: DC | PRN

## 2021-05-17 MED ORDER — BIOTENE DRY MOUTH MT LIQD: 15.0000 mL | OROMUCOSAL | Status: DC | PRN

## 2021-05-17 MED ORDER — DEXTROSE 5 % IV SOLN: INTRAVENOUS | Status: DC

## 2021-05-17 MED ORDER — GLYCOPYRROLATE 0.2 MG/ML IJ SOLN
0.4000 mg | Freq: Four times a day (QID) | INTRAMUSCULAR | Status: DC
Start: 2021-05-17 — End: 2021-05-21
  Administered 2021-05-17 – 2021-05-20 (×14): 0.4 mg via INTRAVENOUS
  Filled 2021-05-17 (×14): qty 2

## 2021-05-17 MED ORDER — HALOPERIDOL 0.5 MG PO TABS: 0.5000 mg | ORAL_TABLET | ORAL | Status: DC | PRN

## 2021-05-17 MED ORDER — ONDANSETRON HCL 4 MG/2ML IJ SOLN: 4.0000 mg | Freq: Four times a day (QID) | INTRAMUSCULAR | Status: DC | PRN

## 2021-05-17 MED ORDER — POVIDONE-IODINE 10 % EX SOLN
CUTANEOUS | Status: AC
  Filled 2021-05-17: qty 118

## 2021-05-17 MED ORDER — GERHARDT'S BUTT CREAM
TOPICAL_CREAM | Freq: Two times a day (BID) | CUTANEOUS | Status: DC
  Filled 2021-05-17: qty 1

## 2021-05-17 MED ORDER — HALOPERIDOL LACTATE 5 MG/ML IJ SOLN: 0.5000 mg | INTRAMUSCULAR | Status: DC | PRN

## 2021-05-17 MED ORDER — HYDROMORPHONE HCL 1 MG/ML IJ SOLN
1.0000 mg | INTRAMUSCULAR | Status: DC | PRN
  Administered 2021-05-17: 1 mg via INTRAVENOUS

## 2021-05-17 MED ORDER — HYDROMORPHONE HCL 1 MG/ML IJ SOLN
1.0000 mg | Freq: Four times a day (QID) | INTRAMUSCULAR | Status: DC
Start: 2021-05-17 — End: 2021-05-18
  Administered 2021-05-17 – 2021-05-18 (×2): 1 mg via INTRAVENOUS
  Filled 2021-05-17 (×3): qty 1

## 2021-05-17 NOTE — Progress Notes (Signed)
Received patient from nights.  Patient is lethargic, responding to pain.  Unable to follow commands.  Received a telephone call from cousin Gregary Signs who lives in [place].  Updated on patient's condition.  Dr. Louanne Belton made aware and provided with Julia's telephone number. ?

## 2021-05-17 NOTE — Progress Notes (Signed)
?  [facility] ?Progress Note  ? ?Assessment/ Plan:   ?OP HD: [facility] ? 4h 28min  84.5kg  300/500  2/2.5 bath LUA AVG  Hep 2500 ? - 3/18 > hep B Ag neg and hep B Abs were +/ protective ? - venofer 50 weekly ? - mircera 50 ug q2, last 4/01, due 4/15 ? - last HD 4/08, came off at dry wt ?  ?  ?Assessment/ Plan: ?L AKA stump infection w/ bacteremia - ID consulting, per pmd / ID.  VVS consulted as well, appreciate assistance ?AMS - multifactorial, ^NH3 w/ infection/ esrd ?Afib w/ RVR - on amio ?ESRD - on HD MWF. Last HD 4/08. Has not missed recently. Next HD 4/10 ?BP/volume - BP's low, not on BP lowering meds at home now. Cont midodrine. Mild- mod dependent edema of LE's--> suspect we will be limited by BP/ HR with UF ?Anemia ckd - Hb 8.3, last esa 4/01, due on 4/15 ?MBD ckd - CCa in range, add on phos. Cont binder if eating solid food ?Bruno -Prognosis very poor per admit attending and per VVS. Appreciate palliative care c/s Support transition to comfort care at any time.   ?  ? ?Subjective:   ? ?Seen in room.  Lethargic and drowsy--> appears ill.  Not responsive to questions.  ? ?Objective:   ?BP 113/78 (BP Location: Right Arm)   Pulse (!) 123   Temp 98.8 ?F (37.1 ?C) (Axillary)   Resp 18   Ht 5\' 10"  (1.778 m)   Wt 81.5 kg   SpO2 95%   BMI 25.78 kg/m?  ? ?Physical Exam: ?Gen: appears ill, lying in bed, intermittently opens eyes ?CVS: tachycardic, systolic murmur ?Resp: clear ?Abd: soft ?Ext: s/p R AKA, s/p L BKA ?ACCESS: L AVF bandaged, small amount of dried blood on dressing ?Labs: ?BMET ?Recent Labs  ?Lab 05/25/2021 ?1436 05/19/2021 ?2058 05/17/21 ?0750  ?NA 137  --  137  ?K 3.7  --  4.4  ?CL 100  --  100  ?CO2 26  --  25  ?GLUCOSE 46*  --  72  ?BUN 12  --  18  ?CREATININE 2.53* 2.81* 3.39*  ?CALCIUM 7.8*  --  8.2*  ?PHOS  --   --  2.0*  ? ?CBC ?Recent Labs  ?Lab 05/29/2021 ?1436 05/17/21 ?0750  ?WBC 27.9* 27.0*  ?HGB 8.3* 8.7*  ?HCT 23.4* 25.0*  ?MCV 102.2* 100.0  ?PLT 242 228  ? ? ?  ?Medications:    ? ? (feeding supplement) PROSource Plus  30 mL Oral BID BM  ? amiodarone  200 mg Oral Daily  ? aspirin  81 mg Oral Daily  ? atorvastatin  10 mg Oral QPM  ? calcium acetate  667 mg Oral TID WC  ? Chlorhexidine Gluconate Cloth  6 each Topical Q0600  ? Gerhardt's butt cream   Topical BID  ? heparin  5,000 Units Subcutaneous Q8H  ? midodrine  10 mg Oral BID WC  ? nutrition supplement (JUVEN)  1 packet Oral BID BM  ? pantoprazole  40 mg Oral Daily  ? silver sulfADIAZINE   Topical Daily  ? ? ? ?Madelon Lips MD ?05/17/2021, 1:28 PM   ?

## 2021-05-17 NOTE — Progress Notes (Signed)
?PROGRESS NOTE ? ? ? ?Brendan Campbell  KCL:[id number] DOB: 05-13-1970 DOA: 05/23/2021 ?PCP: Brendan Blitz, MD  ? ? ?Brief Narrative:  ?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  In the ED, patient was mildly hypotensive and tachycardic with blood glucose level was low at 46.  Ammonia elevated at 107.  Lactate at 1.7.  WBC elevated at 27.9.  Chest x-ray showed interstitial edema.  Code sepsis was initiated and patient was started on antibiotic.  Patient was then admitted to hospital for further evaluation and treatment. ? ?Assessment and Plan: ? ?Acute metabolic encephalopathy  ?Multifactorial from sepsis, hypoglycemia.  Continues to be encephalopathic.  Likely secondary to sepsis.  Continue antibiotic and supportive care.  Ammonia was elevated in hospital.  We will continue with lactulose for now.  Bilirubin was elevated but AST ALT within normal range. ?  ?Sepsis secondary to amputation stump infection (Brendan Campbell) ?Left above-knee amputation stump infection with purulent drainage and odor on presentation with leukocytosis.  Patient was tachycardic, tachypneic and hypothermic on presentation.  Patient was initiated on code sepsis and antibiotics vancomycin Flagyl and imipenem were initiated.  Dr. Unk Campbell vascular surgery was informed from the ED for follow-up.  Procalcitonin was 7.0.  COVID and influenza was negative. ?Blood cultures showed  klebsiella pneumoniae.   Significant leukocytosis present. CTA of the left lower extremity with thrombosis of the distal SFA popliteal artery  trace foci of subcutaneous  gas. ? ?Gram-negative rod bacteremia.  BCID with Klebsiella pneumonia,CTX-M ESBL, and KPC detected.  Pharmacy on board.  Has been started on Avycaz.  ID has been consulted. ? ?Atrial fibrillation with rapid ventricular response with history of chronic atrial fibrillation..  Patient had borderline hypotension.  Received IV digoxin.  Already on amiodarone. ?  ?CHF (congestive heart failure) (Brendan Campbell) ?Patient had missed dialysis.  Resume dialysis while in the hospital if able to tolerate.  Nephrology on board.. ? ?Essential hypertension ?Was hypotensive on admission.  Blood pressure has improved at this time.  Continue midodrine. ?  ?ESRD on hemodialysis (Brendan Campbell) ?Could not complete hemodialysis session due to hypotension initially.  Nephrology for hemodialysis needs. ? ?Hyperlipidemia ?Continue Lipitor. ?  ?Type 2 diabetes mellitus with diabetic chronic kidney disease (Brendan Campbell) with hypoglycemia. ?Continue sliding scale insulin, Accu-Cheks.  Has been having episodes of hypoglycemia.  We will put the patient on D5 water for now ? ?Goals of care, counseling/discussion ?Patient with multiple comorbidities and poor prognosis.  I had a prolonged discussion with the patient's cousin Ms. Brendan Campbell on the phone today.  Spoke with her about the patient's overall prognosis being guarded/poor.  She is going to talk with the rest of the family and reach out to [place].  Palliative care on board for ongoing goals of care..  High possibility of nonhealing AKA stump.  ?  ?  ? DVT prophylaxis: heparin injection 5,000 Units Start: 06/03/2021 2200 ? ? ?Code Status:   ?  Code Status: Full Code ? ?Disposition: Unknown at this time ? ?Status is: Inpatient ? ?Remains inpatient appropriate because: Pending medical improvement, metabolic encephalopathy, sepsis, IV antibiotics, stump infection ? ? Family Communication:  ?I had a  prolonged discussion with the patient's cousin Ms. Brendan Campbell on the phone and updated her about the clinical  condition of the patient and potential poor prognosis.  She is going to discuss with the rest of the family about goals of care ? ?Consultants:  ?Infectious disease ?Nephrology ?Vascular surgery ? ?Procedures:  ?None ? ?Antimicrobials:  ?Avycaz ? ?Anti-infectives (From admission, onward)  ? ? Start     Dose/Rate Route Frequency Ordered Stop  ? 05/16/21 2200  ceFEPIme (MAXIPIME) 1 g in sodium chloride 0.9 % 100 mL IVPB  Status:  Discontinued       ? 1 g ?200 mL/hr over 30 Minutes Intravenous Every 24 hours 05/16/21 0006 05/16/21 0859  ? 05/16/21 1200  ceftazidime-avibactam (AVYCAZ) 0.94 g in sodium chloride 0.9 % 50 mL IVPB       ? 0.94 g ?25 mL/hr over 2 Hours Intravenous Every 24 hours 05/16/21 0912    ? 05/16/21 1000  ceftazidime-avibactam (AVYCAZ) 0.94 g in sodium chloride 0.9 % 50 mL IVPB  Status:  Discontinued       ? 0.94 g ?25 mL/hr over 2 Hours Intravenous Every 24 hours 05/16/21 0900 05/16/21 0912  ? 05/16/21 0500  metroNIDAZOLE (FLAGYL) IVPB 500 mg  Status:  Discontinued       ? 500 mg ?100 mL/hr over 60 Minutes Intravenous Every 12 hours 05/28/2021 2348 05/16/21 1059  ? 05/16/21 0100  vancomycin (VANCOREADY) IVPB 500 mg/100 mL       ?Note to Pharmacy: Previous 1750 mg bag that was infusing had ~ 100-139mL leak  ? 500 mg ?100 mL/hr over 60 Minutes Intravenous  Once 05/16/21 0005 05/16/21 0812  ? 06/01/2021 1715  vancomycin (VANCOREADY) IVPB 1750 mg/350 mL       ? 1,750 mg ?175 mL/hr over 120 Minutes Intravenous  Once 05/24/2021 1705 05/31/2021 2250  ? 05/27/2021 1630  ceFEPIme (MAXIPIME) 2 g in sodium chloride 0.9 % 100 mL IVPB       ? 2 g ?200 mL/hr over 30 Minutes Intravenous  Once 05/17/2021 1625 05/24/2021 1838  ? 06/06/2021 1630  metroNIDAZOLE (FLAGYL) IVPB 500 mg       ? 500 mg ?100 mL/hr over 60 Minutes Intravenous  Once 05/13/2021 1625 05/14/2021 1838  ? 05/17/2021 1630  vancomycin (VANCOCIN) IVPB 1000 mg/200 mL premix  Status:  Discontinued       ? 1,000 mg ?200 mL/hr over 60 Minutes Intravenous  Once 05/30/2021 1625  06/03/2021 1705  ? ?  ? ? ?Subjective: ?Today, patient was seen and examined at bedside.  Spoke with the nursing staff.  Patient remains hyperglycemic.  Somnolent and lethargic. ? ?Objective: ?Vitals:  ? 05/16/21 2010 05/16/21 2012 05/16/21 2315 05/17/21 0411  ?BP:  98/68 131/88   ?Pulse: (!) 118  (!) 132   ?Resp: 10 14 17    ?Temp: 98.8 ?F (37.1 ?C)  98 ?F (36.7 ?C) 98.4 ?F (36.9 ?C)  ?TempSrc: Axillary  Axillary Axillary  ?SpO2:   99%   ?Weight:      ?Height:      ? ? ?Intake/Output Summary (Last 24 hours) at 05/17/2021 1013 ?Last data filed at 05/16/2021 1500 ?Gross per 24 hour  ?Intake 290 ml  ?Output --  ?Net 290 ml  ? ?Filed Weights  ? 05/11/2021 2055  ?Weight: 81.5 kg  ? ? ?Physical Examination: ? ?Body mass index is 25.78 kg/m?.  ?General:  Average built, not in obvious distress, lethargic ?HENT:   Pallor noted.  Oral mucosa is  moist.  ?Chest:    Diminished breath sounds bilaterally. No crackles or wheezes.  ?CVS: S1 &S2 heard. No murmur.  Regular rate and rhythm. ?Abdomen: Soft, nontender, nondistended.  Bowel sounds are heard.   ?Extremities: Left above-knee amputation site with area of black discoloration/necrosis with serosanguineous discharge covered with dressing.  Right below-knee amputation.  [place].   ?Psych: Lethargic. ?CNS: Lethargic.  Moving extremities. ?Skin: Warm and dry.  Left above-knee amputation site with dressing and necrotic area with drainage. ? ? ?Data Reviewed:  ? ?CBC: ?Recent Labs  ?Lab 05/30/2021 ?1436 05/17/21 ?0750  ?WBC 27.9* 27.0*  ?HGB 8.3* 8.7*  ?HCT 23.4* 25.0*  ?MCV 102.2* 100.0  ?PLT 242 228  ? ? ?Basic Metabolic Panel: ?Recent Labs  ?Lab 05/19/2021 ?1436 05/26/2021 ?2058 05/17/21 ?0750  ?NA 137  --  137  ?K 3.7  --  4.4  ?CL 100  --  100  ?CO2 26  --  25  ?GLUCOSE 46*  --  72  ?BUN 12  --  18  ?CREATININE 2.53* 2.81* 3.39*  ?CALCIUM 7.8*  --  8.2*  ?PHOS  --   --  2.0*  ? ? ?Liver Function Tests: ?Recent Labs  ?Lab 05/26/2021 ?1436  ?AST 25  ?ALT 17  ?ALKPHOS 155*  ?BILITOT 2.3*  ?PROT  4.6*  ?ALBUMIN 1.7*  ? ? ? ?Radiology Studies: ?CT Head Wo Contrast ? ?Result Date: 05/26/2021 ?CLINICAL DATA:  Mental status change EXAM: CT HEAD WITHOUT CONTRAST TECHNIQUE: Contiguous axial images were obtained from t

## 2021-05-17 NOTE — Progress Notes (Signed)
?  Daily Progress Note ? ?S/p: Left above-knee amputation, for wound healing, now infected with bacteremia ? ?Subjective: ?Encephalopathic, unable to answer questions ? ?Objective: ?Vitals:  ? 05/17/21 0411 05/17/21 1148  ?BP:  113/78  ?Pulse:  (!) 123  ?Resp:  18  ?Temp: 98.4 ?F (36.9 ?C) 98.8 ?F (37.1 ?C)  ?SpO2:  95%  ?  ?Physical Examination ?Left AKA dressing removed, necrotic, devitalized tissue appreciated throughout the wound bed.  This was packed with betadine soaked gauze ? ?ASSESSMENT/PLAN:  ?Brendan Campbell is a 51 y.o. male who presents from dialysis after hypotensive episode with significant encephalopathy and nonhealing left AKA.   ? ?The wound was opened at bedside yesterday, and further opened today.  Devitalized tissue appreciated throughout the wound bed.  No further tracking. ? ?As stated earlier, prior to discharge, I had significant concerns regarding wound breakdown due to the patient's failure to thrive and poor nutritional status.  Unfortunately, I am not surprised to see him in the emergency department.  With his current encephalopathy, he is unable to make decisions for himself.  His only contact is a girlfriend, however I am unsure as to their involvement.  He will need formal debridement and revision the coming days, however I am concerned with his poor nutritional status, that any amputation revision will have a similar outcome. ? ?Brendan Campbell will need his wound normally addressed at some point.  I am unsure how to navigate this as he has no next of kin, nor proxy.  I spoke to his hospital contact, his roommate, Brendan Campbell.  I had a long conversation with her and asked if they had had any conversations regarding end-of-life care, planning.  They have not had any.  ? ?Dr.Pokhrel was able to reach a niece in [place], who stated he would not want anything firther done. She is discussing the matter with other family members.   I am hoping his encephalopathy improves with opening the wound bed accompanied with  broad-spectrum antibiotics. ? ? ?[name] MD MS ?[facility] ?[phone] ?05/17/2021  ?1:47 PM ? ?

## 2021-05-17 NOTE — Progress Notes (Signed)
?   ? ?[facility]ious Disease ? ?Date of Admission:  05/14/2021    ?       ?Reason for visit: Follow up on bacteremia ? ?Current antibiotics: ?Avycaz ? ? ?ASSESSMENT:   ? ?51 y.o. male admitted with: ? ?Klebsiella pneumonia bacteremia: Secondary to left AKA nonhealing wound and stump infection.  CT scan without evidence of a deep abscess or bone involvement.  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 ?Sacral wound and left posterior hip pressure wounds: Large sacral unstageable pressure injury measuring 11 x 5 cm per WOC note earlier today who is recommending surgical consult for possible debridement if patient continues with full spectrum of care.  Of note, patient also frequently incontinent of stool making it difficult to keep wound from being soiled. ?ESRD on HD. ?Type 2 DM: A1c is 5.3.  Having more issues right now with hypoglycemia due to decreased PO intake. ?Protein calorie malnutrition: Albumin is 1.7. ?Sepsis: Due to #1. ? ?RECOMMENDATIONS:   ? ?Continue Avycaz ?Contact precautions ?Follow up susceptibilities ?VVS following.  Will need formal debridement and revision but concerned that any amputation revision will have a similar outcome given poor nutritional status ?If continues with aggressive care, agree with WOC on surgery consult for sacral wound debridement ?Wound care, glycemic control, off loading, keeping sacral wounds free of urine and stool as much as possible ?Agree with goals of care ?Will follow ? ? ?Principal Problem: ?  Encephalopathy acute ?Active Problems: ?  Type 2 diabetes mellitus with diabetic chronic kidney disease ([place]) ?  Hyperlipidemia ?  ESRD on hemodialysis ([place]) ?  Chronic atrial fibrillation ([facility]) ?  CHF (congestive heart failure) ([place]) ?  Amputation stump infection ([place]) ?  Essential hypertension ?  Goals of care, counseling/discussion ? ? ? ?MEDICATIONS:   ? ?Scheduled Meds: ? (feeding supplement) PROSource Plus   30 mL Oral BID BM  ? amiodarone  200 mg Oral Daily  ? aspirin  81 mg Oral Daily  ? atorvastatin  10 mg Oral QPM  ? calcium acetate  667 mg Oral TID WC  ? Chlorhexidine Gluconate Cloth  6 each Topical Q0600  ? Gerhardt's butt cream   Topical BID  ? heparin  5,000 Units Subcutaneous Q8H  ? midodrine  10 mg Oral BID WC  ? nutrition supplement (JUVEN)  1 packet Oral BID BM  ? pantoprazole  40 mg Oral Daily  ? silver sulfADIAZINE   Topical Daily  ? ?Continuous Infusions: ? ceftazidime avibactam (AVYCAZ) IVPB 0.94 g (05/16/21 1246)  ? dextrose 50 mL/hr at 05/17/21 3817  ? ?PRN Meds:.acetaminophen, albuterol, butalbital-acetaminophen-caffeine, dextrose, lip balm, oxyCODONE ? ?SUBJECTIVE:  ? ?24 hour events:  ?Remains hypoglycemic and lethargic.  Evaluated by wound care today. ?Currently on Avycaz ?Leukocytosis stable ?No fevers ?Blood culture susceptibilities pending ? ? ? ?Review of Systems  ?Unable to perform ROS: Mental acuity  ? ?  ?OBJECTIVE:  ? ?Blood pressure 113/78, pulse (!) 123, temperature 98.8 ?F (37.1 ?C), temperature source Axillary, resp. rate 18, height 5\' 10"  (1.778 m), weight 81.5 kg, SpO2 95 %. ?Body mass index is 25.78 kg/m?. ? ?Physical Exam ?Constitutional:   ?   General: He is not in acute distress. ?HENT:  ?   Head: Normocephalic and atraumatic.  ?Eyes:  ?   Extraocular Movements: Extraocular movements intact.  ?   Conjunctiva/sclera: Conjunctivae normal.  ?Cardiovascular:  ?   Rate and Rhythm: Normal rate and regular rhythm.  ?  Pulmonary:  ?   Effort: Pulmonary effort is normal. No respiratory distress.  ?Abdominal:  ?   General: There is no distension.  ?   Palpations: Abdomen is soft.  ?   Tenderness: There is no abdominal tenderness.  ?Musculoskeletal:  ?   Cervical back: Normal range of motion and neck supple.  ?   Comments: Left AKA  ?Skin: ?   General: Skin is warm and dry.  ?   Findings: No rash.  ?Neurological:  ?   General: No focal deficit present.  ?   Mental Status: He is alert.   ? ? ? ?Lab Results: ?Lab Results  ?Component Value Date  ? WBC 27.0 (H) 05/17/2021  ? HGB 8.7 (L) 05/17/2021  ? HCT 25.0 (L) 05/17/2021  ? MCV 100.0 05/17/2021  ? PLT 228 05/17/2021  ?  ?Lab Results  ?Component Value Date  ? NA 137 05/17/2021  ? K 4.4 05/17/2021  ? CO2 25 05/17/2021  ? GLUCOSE 72 05/17/2021  ? BUN 18 05/17/2021  ? CREATININE 3.39 (H) 05/17/2021  ? CALCIUM 8.2 (L) 05/17/2021  ? GFRNONAA 21 (L) 05/17/2021  ? GFRAA 9 (L) 09/27/2019  ?  ?Lab Results  ?Component Value Date  ? ALT 17 05/27/2021  ? AST 25 05/08/2021  ? ALKPHOS 155 (H) 05/24/2021  ? BILITOT 2.3 (H) 05/28/2021  ? ? ?   ?Component Value Date/Time  ? CRP 7.2 (H) 04/30/2021 0300  ? ? ?   ?Component Value Date/Time  ? ESRSEDRATE 34 (H) 08/02/2019 0409  ? ?  ?I have reviewed the micro and lab results in Epic. ? ?Imaging: ?CT Head Wo Contrast ? ?Result Date: 05/24/2021 ?CLINICAL DATA:  Mental status change EXAM: CT HEAD WITHOUT CONTRAST TECHNIQUE: Contiguous axial images were obtained from the base of the skull through the vertex without intravenous contrast. RADIATION DOSE REDUCTION: This exam was performed according to the departmental dose-optimization program which includes automated exposure control, adjustment of the mA and/or kV according to patient size and/or use of iterative reconstruction technique. COMPARISON:  None. FINDINGS: Brain: No evidence of acute infarction, hemorrhage, hydrocephalus, extra-axial collection or mass lesion/mass effect. Periventricular and deep white matter hypodensity. Vascular: No hyperdense vessel or unexpected calcification. Skull: Normal. Negative for fracture or focal lesion. Sinuses/Orbits: No acute finding. Other: None. IMPRESSION: No acute intracranial pathology. Small-vessel white matter disease. Electronically Signed   By: Delanna Ahmadi M.D.   On: 05/12/2021 15:37  ? ?CT ANGIO LOW EXTREM LEFT W &/OR WO CONTRAST ? ?Result Date: 05/16/2021 ?CLINICAL DATA:  Lower extremity abscess left AKA stump infection,  please evaluate for presence of abscess / fluid collection EXAM: CT ANGIOGRAPHY OF THE LEFT LOWEREXTREMITY TECHNIQUE: Multidetector CT imaging of the LEFT lowerwas performed using the standard protocol during bolus administration of intravenous contrast. Multiplanar CT image reconstructions and MIPs were obtained to evaluate the vascular anatomy. RADIATION DOSE REDUCTION: This exam was performed according to the departmental dose-optimization program which includes automated exposure control, adjustment of the mA and/or kV according to patient size and/or use of iterative reconstruction technique. CONTRAST:  136mL OMNIPAQUE IOHEXOL 350 MG/ML SOLN COMPARISON:  CT AP, 03/10/2021.  CTA AP runoff, 02/12/2021. FINDINGS: VASCULAR; Moderate calcified and noncalcified atherosclerotic disease of the distal aorta and bifurcation without aneurysmal dilation. Patent bilateral CIA, iliac bifurcations, and LEFT EIA. Severe burden of small vessel and microvascular atherosclerosis, as can be seen with diabetic disease Patent LEFT CFA, proximal SFA and profunda. Examination is severely limited secondary to extensive mural  calcifications. Thrombosis of the distal SFA/proximal popliteal artery, past the level of the adductor canal as can be seen in a AKA. NONVASCULAR; Trace volume of free fluid dependent pelvis. Imaged portions of bowel is nonobstructed. Moderate body wall edema. Postsurgical changes of RIGHT above the knee amputation Intact staple line at the medial incision and small foci of subcutaneous gas Removal of lateral incision staples with appearance of incisional packing. No discrete focal drainable collection or abscess. No focal demineralization erosion or periosteal change at the femoral stump to suggest osteomyelitis. No follow-up is indicated for incidental findings, unless specifically mentioned. Review of the MIP images confirms the above findings. IMPRESSION: 1. Thrombosis of the LEFT distal SFA/proximal popliteal  artery, as can be seen in AKA. Severe small vessel and microvascular atherosclerosis, present within the geniculate arteries and as can be seen in diabetic disease may limit stump healing. 2. LEFT AKA with ope

## 2021-05-17 NOTE — Consult Note (Addendum)
WOC Nurse Consult Note: ?Reason for Consult: Consult requested for sacrum and bilat leg stumps.  ?Vascular surgery team is following for assessment and plan of care to left stump dehisced surgical full thickness wounds; refer to their previous progress notes.  They have already ordered betadine dressings to be performed by the bedside nurse; refer to their team for further questions regarding plan of care.  ? ?Pt is very ill and does communicate; he has been evaluated by palliative care team for goals of care.  He has multiple systemic factors which can impair healing. He is frequently incontinent of stool, and it is difficult to keep the wounds from becoming soiled. Extensive areas of full thickness skin loss across bilat buttocks, surrounding the sacrum; affected area is approx 30X30X.2cm, red and moist, interspersed with yellow patchy areas of slough.  Mod amt yellow drainage. ? ?ICD-10 CM Codes for Irritant Dermatitis ?G40N0 - Due to fecal, urinary or dual incontinence ?  ?Wound type: Sacrum with Unstageable pressure injury; 11X5cm, 50% loose black eschar, strong odor, fluctuant, 50% dark red and fluctuant. If aggressive plan of care is desired, please consult surgical team for possible debridement.  ?Left posterior hip with 2 areas of Unstageable pressure injuries; upper 2X5cm, lower 6X8cm.  Both sites are tightly adhered yellow slough with mod amt yellow drainage.  ?Right stump with full thickness wound; 100% tightly adhered yellow slough mod amt yellow drainage; 2X6X.2cm  ?Dressing procedure/placement/frequency: Topical treatment orders provided for bedside nurses to perform as follows:  ?1. Apply betadine-moistened gauze to left stump wounds Q day, then cover with ABD pad and tape ?2. Apply Silvadene to left hip wounds (2) sacrum wound, and right stump wounds Q day, then cover with ABD pads and tape ?3. Apply Gerhardts butt cream to all other wounds surrounding sacrum BID and PRN soiling, then cover with ABD  pads and hold in place with mesh underwear. ? ?Please re-consult if further assistance is needed.  Thank-you,  ?Julien Girt MSN, RN, [place], [place], CNS ?[phone]  ? ?  ?

## 2021-05-17 NOTE — Progress Notes (Signed)
?  Transition of Care (TOC) Screening Note ? ? ?Patient Details  ?Name: Brendan Campbell ?Date of Birth: 02-25-1970 ? ? ?Transition of Care [facility]) CM/SW Contact:    ?Geralynn Ochs, LCSW ?Phone Number: ?05/17/2021, 4:17 PM ? ? ? ?Transition of Care Department [facility]) has reviewed patient, medical workup ongoing. We will continue to monitor patient advancement through interdisciplinary progression rounds. If new patient transition needs arise, please place a TOC consult. ?  ?

## 2021-05-17 NOTE — Progress Notes (Signed)
Palliative: ? ?Full note to follow. I have had conversation with cousin, Gregary Signs, and she has spoken with her mother and her aunt and they agree for full comfort care and they do not want him to suffer. ? ?[name], NP ?Palliative Medicine Team ?Pager [phone] (Please see [email or web address] for schedule) ?Team Phone [phone]  ?

## 2021-05-17 NOTE — Progress Notes (Signed)
Palliative: ? ?HPI: 51 y.o. male  with past medical history of anemia, HFrEF (EF 25%), atrial fibrillation, ESRD on HD (frequently misses treatments), depression, PTSD, diabetes, HLD, PAD s/p R BKA and recent L AKA (complicated by infection leading to AKA revision), s/p surgical debridement L AKA and R BKA 04/27/21 with superficial necrosis admitted on 05/10/2021 with hypotension and hypoglycemia from dialysis center. Hospitalization complicated by infection amputation stump, bacteremia. Overall prognosis poor with nonhealing left AKA and overall failure to thrive. Palliative care requested to assist with goals of care although seems to lack of decision maker.   ? ?I met today at Kru' bedside but no family/visitors present and he is less responsive than yesterday. Still restless and appears overall uncomfortable. Left voicemail for cousin Gregary Signs as given contact number by RN.  ? ?I received call back from [place] and we reviewed Helios' decline and complications over the past month. Gregary Signs has been in contact with Jeneen Rinks over this time but was not aware of the extent of his illness. We had an honest conversation regarding his status and limited options. I fear that he is at end of life and our options to help him improve at this stage of illness are extremely limited. I recommend consideration of focus on comfort at end of life. Gregary Signs tearfully shares that Tareq told her previously that he was tired of dialysis and surgery and even alluded to wanting to stop. Gregary Signs has spoken with her mother and aunt and they want what is best for Jeneen Rinks. DNR and full comfort care are decided and family agree that Pinchus should no longer suffer. I reassured Gregary Signs that we will take good care of Nils and keep him comfortable and out of pain. She expresses deep sadness that he has no support or family locally to be with him. She requests chaplain support to pray over him and reports that he was raised [employer]ec[employer] by his grandmother. Gregary Signs  shares that Elmore has had a difficult life with very limited support from a young age (other than her grandparents). I did return to Islam' bedside and told him that "Alvy Bimler, aunt Stanton Kidney, and Lahoma Crocker love him and are thinking and praying for him" per Julia's request.  ? ?All questions/concerns addressed. Emotional support provided. Updated RN and medical team.  ? ?Exam: Minimally responsive - restless. Does not track or follow commands. HR tachycardic. Breathing regular, unlabored. Wounds not examined.  ? ?Plan: ?- DNR, full comfort care.  ?- Spiritual care consulted for prayer and support by family.  ?- Orders adjusted to reflect full comfort care. Scheduled pain medications ordered. Medication dosage/frequency may be titrated to achieve comfort.  ?- High risk of decline and hospital death.  ? ?71 MIN ? ?[name], NP ?Palliative Medicine Team ?Pager [phone] (Please see [email or web address] for schedule) ?Team Phone [phone]  ? ? ?Greater than 50%  of this time was spent counseling and coordinating care related to the above assessment and plan   ?

## 2021-05-17 NOTE — Progress Notes (Addendum)
Hypoglycemic Event ?  ?CBG: 67 ?  ?Treatment: D50 25 mL (12.5 gm) ?  ?Symptoms:  lethargy, drowsiness ?  ?Follow-up CBG: Time:1233   CBG Result: 110 ?  ?Possible Reasons for Event: Other: unable to tolerate PO due to lethargy/AMS. ?  ?Comments/MD notified:Pokhrel MD ?

## 2021-05-17 NOTE — Progress Notes (Signed)
This chaplain responded to PMT consult for EOL prayer. The chaplain understands the Pt. family is located in [place] and unable to be present. The chaplain is present at the bedside and shared prayer from the [employer] tradition with the Pt.  ? ?This chaplain is available for F/U spiritual care as needed. ? ?Chaplain Sallyanne Kuster ?[phone] ?

## 2021-05-18 DIAGNOSIS — Z515 Encounter for palliative care: Secondary | ICD-10-CM | POA: Diagnosis not present

## 2021-05-18 DIAGNOSIS — A498 Other bacterial infections of unspecified site: Secondary | ICD-10-CM

## 2021-05-18 DIAGNOSIS — Z1619 Resistance to other specified beta lactam antibiotics: Secondary | ICD-10-CM

## 2021-05-18 DIAGNOSIS — T8740 Infection of amputation stump, unspecified extremity: Secondary | ICD-10-CM | POA: Diagnosis not present

## 2021-05-18 DIAGNOSIS — Z7189 Other specified counseling: Secondary | ICD-10-CM | POA: Diagnosis not present

## 2021-05-18 DIAGNOSIS — Z66 Do not resuscitate: Secondary | ICD-10-CM | POA: Diagnosis not present

## 2021-05-18 LAB — CULTURE, BLOOD (ROUTINE X 2): Special Requests: ADEQUATE

## 2021-05-18 MED ORDER — HYDROMORPHONE HCL 1 MG/ML IJ SOLN
1.0000 mg | INTRAMUSCULAR | Status: DC | PRN
  Administered 2021-05-19: 2 mg via INTRAVENOUS
  Filled 2021-05-18: qty 2

## 2021-05-18 MED ORDER — HYDROMORPHONE HCL 1 MG/ML IJ SOLN
1.0000 mg | INTRAMUSCULAR | Status: DC
  Administered 2021-05-18 (×3): 2 mg via INTRAVENOUS
  Administered 2021-05-18: 1 mg via INTRAVENOUS
  Administered 2021-05-19 (×3): 2 mg via INTRAVENOUS
  Administered 2021-05-19: 1 mg via INTRAVENOUS
  Administered 2021-05-19 (×2): 2 mg via INTRAVENOUS
  Administered 2021-05-20: 1 mg via INTRAVENOUS
  Administered 2021-05-20 (×2): 2 mg via INTRAVENOUS
  Administered 2021-05-20: 1 mg via INTRAVENOUS
  Administered 2021-05-20 (×2): 2 mg via INTRAVENOUS
  Filled 2021-05-18: qty 1
  Filled 2021-05-18 (×3): qty 2
  Filled 2021-05-18: qty 1
  Filled 2021-05-18 (×3): qty 2
  Filled 2021-05-18: qty 1
  Filled 2021-05-18 (×6): qty 2
  Filled 2021-05-18: qty 1

## 2021-05-18 NOTE — Progress Notes (Signed)
Palliative: ? ?HPI: 51 y.o. male  with past medical history of anemia, HFrEF (EF 25%), atrial fibrillation, ESRD on HD (frequently misses treatments), depression, PTSD, diabetes, HLD, PAD s/p R BKA and recent L AKA (complicated by infection leading to AKA revision), s/p surgical debridement L AKA and R BKA 04/27/21 with superficial necrosis admitted on 05/28/2021 with hypotension and hypoglycemia from dialysis center. Hospitalization complicated by infection amputation stump, bacteremia. Overall prognosis poor with nonhealing left AKA and overall failure to thrive. Palliative care requested to assist with goals of care although seems to lack of decision maker.    ? ?I came to Brendan Campbell' bedside. He is more sleepy and comfortable compared to yesterday. He does have occasional moan and is sideways in bed. Scheduled medications adjusted to better provide comfort (RN has documented PAINAD 6 as well). His breathing pattern is more irregular with episodes of apnea new since yesterday. Medications reviewed for use of PRNs. Brendan Campbell, is next of kin but in [facility] and unable to visit. She is attempting to plan for getting Brendan Campbell to [name] after his passing where he has pre paid arrangements with funeral home there.  ? ?Emotional support provided.  ? ?Exam: More comfortable but could be improved. Pale. Breathing irregular, apnea noted. Abd soft. ? ?Plan: ?- Increased frequency of scheduled dilaudid and provided larger range dose to ensure comfort.  ?- Continue full comfort measures.  ?- Anticipate hospital death.  ? ?25 min ? ?Brendan Sill, NP ?Palliative Medicine Team ?Pager [phone] (Please see [email or web address] for schedule) ?Team Phone [phone]  ? ? ?Greater than 50%  of this time was spent counseling and coordinating care related to the above assessment and plan   ?

## 2021-05-18 NOTE — Progress Notes (Signed)
?PROGRESS NOTE ? ? ? ?Brendan Campbell  VXB:[id number] DOB: 1970-12-31 DOA: 06/04/2021 ?PCP: Monico Blitz, MD  ? ? 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  In the ED, patient was mildly hypotensive and tachycardic with blood glucose level was low at 46.  Ammonia elevated at 107.  Lactate at 1.7.  WBC elevated at 27.9.  Chest x-ray showed interstitial edema.  Code sepsis was initiated and patient was started on antibiotic.  Patient was then admitted to hospital for further evaluation and treatment. ? ?Assessment and Plan: ? ?Acute metabolic encephalopathy  ?Multifactorial from sepsis, hypoglycemia. Bilirubin was elevated but AST ALT within normal range.  Currently on comfort care. ?  ?Sepsis secondary to amputation stump infection ([place]) ?Carbapenem resistant Klebsiella pneumonia infection ?Left above-knee amputation stump infection with purulent drainage and odor on presentation with leukocytosis.  Patient was tachycardic, tachypneic and hypothermic on presentation.  Patient was initiated on code sepsis and antibiotics vancomycin Flagyl and imipenem were initially given.  Dr. Unk Lightning vascular surgery was informed as well. Procalcitonin was 7.0.  COVID and influenza was negative. ?Blood cultures showed  klebsiella pneumoniae, Carbapenem resistant..   Significant leukocytosis present. CTA of the left lower extremity with thrombosis of the distal SFA popliteal artery  trace foci of subcutaneous gas.   ? ?Atrial fibrillation with rapid ventricular response with history of chronic atrial  fibrillation..  Patient had borderline hypotension.  Received IV digoxin.  Currently on comfort care. ?  ?CHF (congestive heart failure) ([place]) ?Patient had missed dialysis.  Nephrology was initially consulted.  Currently on comfort care ? ?Essential hypertension ?Was hypotensive on admission.  Currently on comfort care. ? ?ESRD on hemodialysis ([place]) ?Currently on comfort care. ? ?Hyperlipidemia ?On comfort care. ?  ?Type 2 diabetes mellitus with diabetic chronic kidney disease ([place]) with hypoglycemia. ?Had episodes of hypoglycemia.  Currently on comfort care ? ?Goals of care, counseling/discussion ?Patient with multiple comorbidities and poor prognosis.  Palliative care on board and patient has been initiated on comfort care. ?  ?  ? DVT prophylaxis:   None for comfort ? ? ?Code Status:   ?  Code Status: DNR ? ?Disposition: Unknown at this time, likely in hospital death. ? ?Status is: Inpatient ? ?Remains inpatient appropriate because: Comfort care, ? ? Family Communication:  ?Spoke with the patient's cousin Ms. Shara Blazing yesterday. ? ?Consultants:  ?Infectious disease ?Nephrology ?Vascular surgery ?Palliative care ? ?Procedures:  ?None ? ?Antimicrobials:  ?None currently ? ? ?Subjective: ?Today, patient was seen and examined at bedside.  Appears to be lethargic but comfortable.   ? ?Objective: ?Vitals:  ? 05/17/21 0411 05/17/21 1148 05/17/21 1940 05/18/21 0820  ?BP:  113/78 106/80 106/77  ?Pulse:  (!) 123 (!) 127 (!) 130  ?Resp:  18 19 18   ?Temp: 98.4 ?F (36.9 ?C) 98.8 ?F (37.1 ?C) 98.7 ?F (37.1 ?C) 98.4 ?F (36.9 ?C)  ?TempSrc: Axillary Axillary Oral Oral  ?SpO2:  95% 95% 91%  ?Weight:      ?Height:      ? ? ?Intake/Output Summary (Last 24 hours) at 05/18/2021 1024 ?Last data filed at 05/17/2021 1500 ?Johney Maine  per 24 hour  ?Intake 282.47 ml  ?Output --  ?Net 282.47 ml  ? ?Filed Weights  ? 06/02/2021 2055  ?Weight: 81.5 kg  ? ? ?Physical Examination: ? ?Body mass index is 25.78 kg/m?[name]  ?Patient appears to be  lethargic but comfortable.  On comfort care.  Pallor noted.   Left above-knee amputation site with area of black discoloration/necrosis with serosanguineous discharge covered with dressing.  Right below-knee amputation.  ? ?Data Reviewed:  ? ?CBC: ?Recent Labs  ?Lab 06/06/2021 ?1436 05/17/21 ?0750  ?WBC 27.9* 27.0*  ?HGB 8.3* 8.7*  ?HCT 23.4* 25.0*  ?MCV 102.2* 100.0  ?PLT 242 228  ? ? ?Basic Metabolic Panel: ?Recent Labs  ?Lab 05/25/2021 ?1436 05/31/2021 ?2058 05/17/21 ?0750  ?NA 137  --  137  ?K 3.7  --  4.4  ?CL 100  --  100  ?CO2 26  --  25  ?GLUCOSE 46*  --  72  ?BUN 12  --  18  ?CREATININE 2.53* 2.81* 3.39*  ?CALCIUM 7.8*  --  8.2*  ?PHOS  --   --  2.0*  ? ? ?Liver Function Tests: ?Recent Labs  ?Lab 05/26/2021 ?1436  ?AST 25  ?ALT 17  ?ALKPHOS 155*  ?BILITOT 2.3*  ?PROT 4.6*  ?ALBUMIN 1.7*  ? ? ? ?Radiology Studies: ?No results found. ? ? ? LOS: 3 days  ? ? ?Flora Lipps, MD ?[facility] ?Available via Epic secure chat 7am-7pm ?After these hours, please refer to coverage provider listed on [email or web address] ?05/18/2021, 10:24 AM  ? ? ?

## 2021-05-18 NOTE — Progress Notes (Signed)
Patient Brendan Campbell      DOB: [birth year]      UXN:[id number] ? ? ? ?  ?Palliative Medicine Team ? ? ? ?Subjective: Bedside symptom check. No family or visitors present at time of visit.  ? ? ?Physical exam: Patient resting with eyes closed in bed at time of visit. Patient with non-labored breathing and short periods of apnea noted. No excessive secretions observed. Patient without physical or non-verbal signs of pain or discomfort at this time. This RN did not attempt to wake the patient, to encourage rest and comfort. ? ? ?Assessment and plan: Patient appears very comfortable with current comfort care interventions. Will continue to follow patient for any needs or advancements.  ? ? ?Thank you for allowing the Palliative Medicine Team to assist in the care of this patient. ?  ?  ?Damian Leavell, MSN, RN ?Palliative Medicine Team ?Team Phone: [phone]  ?This phone is monitored 7a-7p, please reach out to attending physician outside of these hours for urgent needs.   ?

## 2021-05-18 NOTE — Progress Notes (Addendum)
?  Progress Note ? ? ? ?05/18/2021 ?7:13 AM ? ?Subjective:  encephalopathic ? ? ?Vitals:  ? 05/17/21 1148 05/17/21 1940  ?BP: 113/78 106/80  ?Pulse: (!) 123 (!) 127  ?Resp: 18 19  ?Temp: 98.8 ?F (37.1 ?C) 98.7 ?F (37.1 ?C)  ?SpO2: 95% 95%  ? ?Physical Exam: ?Lungs:  non labored ?Incisions: dressings left in place ?Neurologic: encephalopathic ? ?CBC ?   ?Component Value Date/Time  ? WBC 27.0 (H) 05/17/2021 0750  ? RBC 2.50 (L) 05/17/2021 0750  ? HGB 8.7 (L) 05/17/2021 0750  ? HGB 10.4 (L) 06/02/2016 1107  ? HCT 25.0 (L) 05/17/2021 0750  ? HCT 32.9 (L) 06/02/2016 1107  ? PLT 228 05/17/2021 0750  ? PLT 389 (H) 06/02/2016 1107  ? MCV 100.0 05/17/2021 0750  ? MCV 94 06/02/2016 1107  ? MCH 34.8 (H) 05/17/2021 0750  ? MCHC 34.8 05/17/2021 0750  ? RDW 20.0 (H) 05/17/2021 0750  ? RDW 16.0 (H) 06/02/2016 1107  ? LYMPHSABS 0.8 04/24/2021 0420  ? LYMPHSABS 2.9 06/02/2016 1107  ? MONOABS 0.6 04/24/2021 0420  ? EOSABS 0.0 04/24/2021 0420  ? EOSABS 0.5 (H) 06/02/2016 1107  ? BASOSABS 0.0 04/24/2021 0420  ? BASOSABS 0.1 06/02/2016 1107  ? ? ?BMET ?   ?Component Value Date/Time  ? NA 137 05/17/2021 0750  ? NA 142 12/16/2015 1051  ? K 4.4 05/17/2021 0750  ? CL 100 05/17/2021 0750  ? CO2 25 05/17/2021 0750  ? GLUCOSE 72 05/17/2021 0750  ? BUN 18 05/17/2021 0750  ? BUN 45 (H) 12/16/2015 1051  ? CREATININE 3.39 (H) 05/17/2021 0750  ? CALCIUM 8.2 (L) 05/17/2021 0750  ? CALCIUM 7.7 (L) 04/30/2021 0845  ? GFRNONAA 21 (L) 05/17/2021 0750  ? GFRAA 9 (L) 09/27/2019 0951  ? ? ?INR ?   ?Component Value Date/Time  ? INR 1.4 (H) 02/13/2021 0304  ? ? ? ?Intake/Output Summary (Last 24 hours) at 05/18/2021 0713 ?Last data filed at 05/17/2021 1500 ?Gross per 24 hour  ?Intake 282.47 ml  ?Output --  ?Net 282.47 ml  ? ? ? ?Assessment/Plan:  51 y.o. male with non healing amputations ? ?Plans noted for full comfort care after discussion with family and palliative medicine.  Vascular surgery will sign off.  Can continue dressing changes as needed for hygenic  purposes  ? ? ?Dagoberto Ligas, PA-C ?[facility] ?[phone] ?05/18/2021 ?7:13 AM ? ?VASCULAR STAFF ADDENDUM: ?I appreciate the excellent care and support the primary team, palliative care, and floor nursing have provided Mr. Coin.  Please call me if I can be of any assistance. ? ?[name], MD ?[facility] ?Office Phone Number: [phone] ?05/18/2021 7:41 AM ? ? ? ? ?

## 2021-05-18 NOTE — Progress Notes (Signed)
Updated Gregary Signs, pts first cousin.  Per family, pt requesting requesting funeral services through [facility] in [place], [place].  Family has contacted funeral home already.  Emotional support provided.  Will continue to monitor pt and update family as needed.   ?

## 2021-05-18 NOTE — Plan of Care (Signed)
Pt comfort care.  ? ?Problem: Coping: ?Goal: Level of anxiety will decrease ?Outcome: Progressing ?  ?Problem: Pain Managment: ?Goal: General experience of comfort will improve ?Outcome: Progressing ?  ?Problem: Safety: ?Goal: Ability to remain free from injury will improve ?Outcome: Progressing ?  ?Problem: Education: ?Goal: Knowledge of General Education information will improve ?Description: Including pain rating scale, medication(s)/side effects and non-pharmacologic comfort measures ?Outcome: Not Progressing ?  ?Problem: Health Behavior/Discharge Planning: ?Goal: Ability to manage health-related needs will improve ?Outcome: Not Progressing ?  ?Problem: Clinical Measurements: ?Goal: Ability to maintain clinical measurements within normal limits will improve ?Outcome: Not Progressing ?Goal: Will remain free from infection ?Outcome: Not Progressing ?Goal: Cardiovascular complication will be avoided ?Outcome: Not Progressing ?  ?Problem: Activity: ?Goal: Risk for activity intolerance will decrease ?Outcome: Not Progressing ?  ?Problem: Nutrition: ?Goal: Adequate nutrition will be maintained ?Outcome: Not Progressing ?  ?Problem: Skin Integrity: ?Goal: Risk for impaired skin integrity will decrease ?Outcome: Not Progressing ?  ?

## 2021-05-19 DIAGNOSIS — I482 Chronic atrial fibrillation, unspecified: Secondary | ICD-10-CM | POA: Diagnosis not present

## 2021-05-19 DIAGNOSIS — Z794 Long term (current) use of insulin: Secondary | ICD-10-CM

## 2021-05-19 DIAGNOSIS — I5042 Chronic combined systolic (congestive) and diastolic (congestive) heart failure: Secondary | ICD-10-CM | POA: Diagnosis not present

## 2021-05-19 DIAGNOSIS — E1122 Type 2 diabetes mellitus with diabetic chronic kidney disease: Secondary | ICD-10-CM

## 2021-05-19 DIAGNOSIS — A498 Other bacterial infections of unspecified site: Secondary | ICD-10-CM | POA: Diagnosis not present

## 2021-05-19 DIAGNOSIS — T8740 Infection of amputation stump, unspecified extremity: Secondary | ICD-10-CM | POA: Diagnosis not present

## 2021-05-19 NOTE — Plan of Care (Signed)
Pt is comfort measures. Pt has been responsive to pain through the night, this morning pt had eyes open and moaning. Pt begins to grimace and moan when touched or repositioned in bed. This am 0600 2mg  dilaudid given, 15 min later perineal area cleaned, pt noted moaning loud, pt noted saying "oh my God!". Pt repositioned. Moments later noted resting. No distress noted.  ? ? ?Problem: Education: ?Goal: Knowledge of General Education information will improve ?Description: Including pain rating scale, medication(s)/side effects and non-pharmacologic comfort measures ?Outcome: Progressing ?  ?Problem: Health Behavior/Discharge Planning: ?Goal: Ability to manage health-related needs will improve ?Outcome: Progressing ?  ?Problem: Health Behavior/Discharge Planning: ?Goal: Ability to manage health-related needs will improve ?Outcome: Progressing ?  ?Problem: Clinical Measurements: ?Goal: Ability to maintain clinical measurements within normal limits will improve ?Outcome: Progressing ?Goal: Will remain free from infection ?Outcome: Progressing ?Goal: Cardiovascular complication will be avoided ?Outcome: Progressing ?  ?Problem: Activity: ?Goal: Risk for activity intolerance will decrease ?Outcome: Progressing ?  ?Problem: Nutrition: ?Goal: Adequate nutrition will be maintained ?Outcome: Progressing ?  ?Problem: Coping: ?Goal: Level of anxiety will decrease ?Outcome: Progressing ?  ?Problem: Pain Managment: ?Goal: General experience of comfort will improve ?Outcome: Progressing ?  ?Problem: Safety: ?Goal: Ability to remain free from injury will improve ?Outcome: Progressing ?  ?Problem: Skin Integrity: ?Goal: Risk for impaired skin integrity will decrease ?Outcome: Progressing ?  ?

## 2021-05-19 NOTE — Assessment & Plan Note (Signed)
Pressure Injury 05/29/2021 Sacrum Unstageable - Full thickness tissue loss in which the base of the injury is covered by slough (yellow, tan, gray, green or brown) and/or eschar (tan, brown or black) in the wound bed. (Active)  ?05/13/2021 2100  ?Location: Sacrum  ?Location Orientation:   ?Staging: Unstageable - Full thickness tissue loss in which the base of the injury is covered by slough (yellow, tan, gray, green or brown) and/or eschar (tan, brown or black) in the wound bed.  ?Wound Description (Comments):   ?Present on Admission: Yes  ?   ?Pressure Injury 05/16/21 Hip Left;Posterior Unstageable - Full thickness tissue loss in which the base of the injury is covered by slough (yellow, tan, gray, green or brown) and/or eschar (tan, brown or black) in the wound bed. Unstageable pressure injur (Active)  ?05/16/21   ?Location: Hip  ?Location Orientation: Left;Posterior  ?Staging: Unstageable - Full thickness tissue loss in which the base of the injury is covered by slough (yellow, tan, gray, green or brown) and/or eschar (tan, brown or black) in the wound bed.  ?Wound Description (Comments): Unstageable pressure injuries in 2 locations; upper and lower  ?Present on Admission: Yes  ? ? ?

## 2021-05-19 NOTE — Progress Notes (Signed)
?PROGRESS NOTE ? ? ? ?Brendan Campbell  ESP:[id number] DOB: 1970-07-31 DOA: 05/10/2021 ?PCP: Monico Blitz, MD  ? ? ?Chief Complaint  ?Patient presents with  ? Altered Mental Status  ? ? 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  In the ED, patient was mildly hypotensive and tachycardic with blood glucose level was low at 46.  Ammonia elevated at 107.  Lactate at 1.7.  WBC elevated at 27.9.  Chest x-ray showed interstitial edema.  Code sepsis was initiated and patient was started on antibiotic.  Patient was then admitted to hospital for further evaluation and treatment. ? ?Assessment and Plan: ? ?Acute metabolic encephalopathy  ?Multifactorial from sepsis, hypoglycemia. Bilirubin was elevated but AST ALT within normal range.  Currently on comfort care. ?  ?Sepsis secondary to amputation stump infection ([place]) ?Carbapenem resistant Klebsiella pneumonia infection ?Left above-knee amputation stump infection with purulent drainage and odor on presentation with leukocytosis.  Patient was tachycardic, tachypneic and hypothermic on presentation.  Patient was initiated on code sepsis and antibiotics vancomycin Flagyl and imipenem were initially given.  Dr. Unk Lightning vascular surgery was informed as well. Procalcitonin was 7.0.  COVID and influenza was negative. ?Blood cultures showed  klebsiella pneumoniae, Carbapenem resistant..   Significant leukocytosis present. CTA of the left lower extremity with thrombosis of the distal SFA popliteal artery  trace foci of subcutaneous gas.   ? ?Atrial  fibrillation with rapid ventricular response with history of chronic atrial fibrillation..  Patient had borderline hypotension.  Received IV digoxin.  Currently on comfort care. ?  ?CHF (congestive heart failure) ([place]) ?Patient had missed dialysis.  Nephrology was initially consulted.  Currently on comfort care ? ?Essential hypertension ?Was hypotensive on admission.  Currently on comfort care. ? ?ESRD on hemodialysis ([place]) ?Currently on comfort care. ? ?Hyperlipidemia ?On comfort care. ?  ?Type 2 diabetes mellitus with diabetic chronic kidney disease ([place]) with hypoglycemia. ?Had episodes of hypoglycemia.  Currently on comfort care ? ?Goals of care, counseling/discussion ?Patient with multiple comorbidities and poor prognosis.  Palliative care on board and patient has been initiated on comfort care.  ? ? ?Assessment & Plan: ? Principal Problem: ?  Carbapenem-resistant Klebsiella pneumoniae infection ?Active Problems: ?  Goals of care, counseling/discussion ?  Amputation stump infection ([place]) ?  CHF (congestive heart failure) ([place]) ?  Type 2 diabetes mellitus with diabetic chronic kidney disease ([place]) ?  Hyperlipidemia ?  ESRD on hemodialysis ([place]) ?  Chronic atrial fibrillation ([facility]) ?  Essential hypertension ?  Encephalopathy acute ?  Gram-negative sepsis, unspecified ([place]) ?  Sacral wound ? ? ? ?Assessment and Plan: ?Goals of care, counseling/discussion ?Patient with multiple co-morbidities and very poor prognosis. Needs to consider goals of care including possible hospice care, I.e. withdrawing HD. With non-healing AKA stump options for cure are very limited. ?-Patient seen by palliative care and patient now full comfort measures ? ?Amputation stump infection ([place]) ?Patient with florid infection left AKA with purulent drainage and malodor. Marked leukcytosis. In ED code sepsis initiated with VAnc, Flagyl, Imipenem. ?With severe protein malnutrition, DM, poor adherence healing will be very challenging. Dr.  Virl Cagey, vascular surgery,  who knows the patient is informed and will follow the patient during the hospitalization.  ?-Patient seen in consultation by ID. ?-Blood cultures done positive for Klebsiella pneumoniae, carbapenem resistant. ?-Patient noted with a significant leukocytosis present. ?-CT angiogram of the left lower extremity with thrombosis of the distal SFA popliteal artery, trace foci of subcutaneous gas. ?-Patient has been now transition to full comfort measures. ? ?CHF (congestive heart failure) ([place]) ?No apparent decompensation. Pulmonary edema likely 2/2 fluid overload from missed HD. ?-Patient was seen by nephrology earlier on during the hospitalization and noted to have a poor prognosis. ?-Palliative care consulted and patient now full comfort measures. ? ?Sacral wound ?Pressure Injury 05/13/2021 Sacrum Unstageable - Full thickness tissue loss in which the base of the injury is covered by slough (yellow, tan, gray, green or brown) and/or eschar (tan, brown or black) in the wound bed. (Active)  ?05/12/2021 2100  ?Location: Sacrum  ?Location Orientation:   ?Staging: Unstageable - Full thickness tissue loss in which the base of the injury is covered by slough (yellow, tan, gray, green or brown) and/or eschar (tan, brown or black) in the wound bed.  ?Wound Description (Comments):   ?Present on Admission: Yes  ?   ?Pressure Injury 05/16/21 Hip Left;Posterior Unstageable - Full thickness tissue loss in which the base of the injury is covered by slough (yellow, tan, gray, green or brown) and/or eschar (tan, brown or black) in the wound bed. Unstageable pressure injur (Active)  ?05/16/21   ?Location: Hip  ?Location Orientation: Left;Posterior  ?Staging: Unstageable - Full thickness tissue loss in which the base of the injury is covered by slough (yellow, tan, gray, green or brown) and/or eschar (tan, brown or black) in the wound bed.  ?Wound Description (Comments): Unstageable pressure injuries in 2 locations;  upper and lower  ?Present on Admission: Yes  ? ? ? ?Encephalopathy acute ?Multi-factorial. ?-Likely secondary to sepsis, hypoglycemia, hypotension. ?-Patient seen by palliative care and has been transitioned to full comfort measures. ? ?Essential hypertension ?Hypotensive at admission 2/2 sepsis ?-Patient seen by palliative care and patient transition to full comfort measures. ? ?Chronic atrial fibrillation ([place]) ?-Patient noted to be borderline hypotensive on admission.  -Patient received IV digoxin.   ?-Patient seen by palliative care and has been transitioned to full comfort measures.   ? ?ESRD on hemodialysis ([place]) ?Patient on HD - could not complete treatment on day of admission secondary to acute decompensation.  ?-Patient seen by nephrology felt to have a poor prognosis.  ?-Patient seen by palliative care and has been transition to full comfort measures.   ? ?Hyperlipidemia ?- Patient has been transition to full comfort measures. ? ?Type 2 diabetes mellitus with diabetic chronic kidney disease ([place]) ?Was hypoglycemic at admission. No glycemic meds on med rec. ?-Patient now full comfort measures. ? ? ? ? ?  ? ? ?DVT prophylaxis: Comfort care. ?Code Status: DNR. ?Family Communication: No family at bedside. ?Disposition: Likely in-hospital death. ? ?Status is: Inpatient ?Remains inpatient appropriate because: Severity of illness.  Patient now comfort care likely in-hospital death. ?  ?Consultants:  ?ID: Dr.Comer 05/16/2021 ?Vascular surgery: Dr. Virl Cagey 05/17/2021 ?Nephrology: Dr.Schertz 05/16/2021 ?Palliative care: [name], NP 02/12/1094 ?Wound care: Julien Girt, RN 05/17/2021 ? ?Procedures:  ?CT angiogram left lower extremity 05/16/2021 ?CT head without contrast 05/14/2021 ?Chest x-ray 06/06/2021 ? ?Antimicrobials:  ?Anti-infectives (From admission, onward)  ? ? Start     Dose/Rate Route Frequency Ordered Stop  ? 05/16/21 2200  ceFEPIme (MAXIPIME) 1 g in  sodium chloride 0.9 % 100 mL IVPB  Status:  Discontinued       ? 1  g ?200 mL/hr over 30 Minutes Intravenous Every 24 hours 05/16/21 0006 05/16/21 0859  ? 05/16/21 1200  ceftazidime-avibactam (AVYCAZ) 0.94 g in sodium chloride 0.9 % 50 mL IVPB  Status:  Discontinued       ? 0.94 g ?25

## 2021-05-19 NOTE — Progress Notes (Signed)
Patient TV:IFXGX D Nish      DOB: Jul 28, 1970      IVH:[id number] ? ? ? ?  ?Palliative Medicine Team ? ? ? ?Subjective: Bedside symptom check. No family or visitors present at time of visit.  ? ? ?Physical exam: Patient resting in bed with eyes closed at time of visit. Breathing even and non-labored, no excessive secretions noted. No physical or non-verbal signs of pain or discomfort noted. This RN assists to reposition patient and distribute pillows under head and arms to assist with dependent edema. Patient briefly opens eyes. This RN asks if he is comfortable and he replies "yes" and returns to resting with eyes closed.  ? ? ?Assessment and plan: Bedside RN reports that the patient experiences a great deal of pain with daily dressing changes. Dressings changes are ordered PRN for keeping patient clean and comfortable, this RN advised bedside staff to administer PRN analgesic 15-30 minutes prior to changing. RN in agreement. Plan to consult TOC to have evaluation for residential hospice. Bedside RN without any other needs or concerns at this time. Will continue to follow for any changes or advances. ? ? ?Thank you for allowing the Palliative Medicine Team to assist in the care of this patient. ?  ?  ?Damian Leavell, MSN, RN ?Palliative Medicine Team ?Team Phone: [phone]  ?This phone is monitored 7a-7p, please reach out to attending physician outside of these hours for urgent needs.   ?

## 2021-05-20 DIAGNOSIS — A498 Other bacterial infections of unspecified site: Secondary | ICD-10-CM | POA: Diagnosis not present

## 2021-05-20 DIAGNOSIS — T8740 Infection of amputation stump, unspecified extremity: Secondary | ICD-10-CM | POA: Diagnosis not present

## 2021-05-20 DIAGNOSIS — I5042 Chronic combined systolic (congestive) and diastolic (congestive) heart failure: Secondary | ICD-10-CM | POA: Diagnosis not present

## 2021-05-20 DIAGNOSIS — I482 Chronic atrial fibrillation, unspecified: Secondary | ICD-10-CM | POA: Diagnosis not present

## 2021-05-20 LAB — CULTURE, BLOOD (ROUTINE X 2): Culture: NO GROWTH

## 2021-05-20 NOTE — Progress Notes (Signed)
Patient TC:CEQFD D Toran      DOB: [birth year]      VOU:[id number] ? ? ? ?  ?Palliative Medicine Team ? ? ? ?Subjective: Bedside symptom check. No family or visitors present at time of visit.  ? ? ?Physical exam: Patient resting in bed with eyes closed at time of visit. Breathing even and non-labored with mouth open, no excessive secretions noted. No physical or non-verbal signs of pain or discomfort noted. Patient appears pale and slightly jaundiced. This RN did not attempt to wake patient for assessment to encourage comfort.  ? ? ?Assessment and plan: Patient remains on full comfort measures. Bedside RN without any needs or concerns today. Will continue to follow patient, anticipate in hospital death.  ? ? ? ?Thank you for allowing the Palliative Medicine Team to assist in the care of this patient. ?  ?  ?Damian Leavell, MSN, RN ?Palliative Medicine Team ?Team Phone: [phone]  ?This phone is monitored 7a-7p, please reach out to attending physician outside of these hours for urgent needs.   ?

## 2021-05-20 NOTE — Plan of Care (Signed)
Pt is responsive to painful stimuli (when repositioned or moved in anyway). Pt has scheduled dilaudid for pain. Oral care completed. Pt moves head away when providing oral care. No distress noted. Comfort measures continued per order.  ? ? ?Problem: Education: ?Goal: Knowledge of General Education information will improve ?Description: Including pain rating scale, medication(s)/side effects and non-pharmacologic comfort measures ?Outcome: Progressing ?  ?Problem: Health Behavior/Discharge Planning: ?Goal: Ability to manage health-related needs will improve ?Outcome: Progressing ?  ?Problem: Clinical Measurements: ?Goal: Ability to maintain clinical measurements within normal limits will improve ?Outcome: Progressing ?Goal: Will remain free from infection ?Outcome: Progressing ?Goal: Cardiovascular complication will be avoided ?Outcome: Progressing ?  ?Problem: Activity: ?Goal: Risk for activity intolerance will decrease ?Outcome: Progressing ?  ?Problem: Nutrition: ?Goal: Adequate nutrition will be maintained ?Outcome: Progressing ?  ?Problem: Coping: ?Goal: Level of anxiety will decrease ?Outcome: Progressing ?  ?Problem: Pain Managment: ?Goal: General experience of comfort will improve ?Outcome: Progressing ?  ?Problem: Safety: ?Goal: Ability to remain free from injury will improve ?Outcome: Progressing ?  ?Problem: Skin Integrity: ?Goal: Risk for impaired skin integrity will decrease ?Outcome: Progressing ?  ?

## 2021-05-20 NOTE — Plan of Care (Signed)
?  Problem: Education: ?Goal: Knowledge of General Education information will improve ?Description: Including pain rating scale, medication(s)/side effects and non-pharmacologic comfort measures ?Outcome: Not Progressing ?  ?Problem: Health Behavior/Discharge Planning: ?Goal: Ability to manage health-related needs will improve ?Outcome: Not Progressing ?  ?Problem: Clinical Measurements: ?Goal: Ability to maintain clinical measurements within normal limits will improve ?Outcome: Not Progressing ?Goal: Will remain free from infection ?Outcome: Not Progressing ?Goal: Cardiovascular complication will be avoided ?Outcome: Not Progressing ?  ?Problem: Activity: ?Goal: Risk for activity intolerance will decrease ?Outcome: Not Progressing ?  ?Problem: Nutrition: ?Goal: Adequate nutrition will be maintained ?Outcome: Not Progressing ?  ?Problem: Coping: ?Goal: Level of anxiety will decrease ?Outcome: Not Progressing ?  ?Problem: Pain Managment: ?Goal: General experience of comfort will improve ?Outcome: Not Progressing ?  ?Problem: Safety: ?Goal: Ability to remain free from injury will improve ?Outcome: Not Progressing ?  ?Problem: Skin Integrity: ?Goal: Risk for impaired skin integrity will decrease ?Outcome: Not Progressing ?  ?

## 2021-05-20 NOTE — Progress Notes (Signed)
?PROGRESS NOTE ? ? ? ?Brendan Campbell  ZTI:[id number] DOB: [birth year] DOA: 06/06/2021 ?PCP: Monico Blitz, MD  ? ? ?Chief Complaint  ?Brendan Campbell presents with  ? Altered Mental Status  ? ? 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  In the ED, Brendan Campbell was mildly hypotensive and tachycardic with blood glucose level was low at 46.  Ammonia elevated at 107.  Lactate at 1.7.  WBC elevated at 27.9.  Chest x-ray showed interstitial edema.  Code sepsis was initiated and Brendan Campbell was started on antibiotic.  Brendan Campbell was then admitted to hospital for further evaluation and treatment. ? ?Assessment and Plan: ? ?Acute metabolic encephalopathy  ?Multifactorial from sepsis, hypoglycemia. Bilirubin was elevated but AST ALT within normal range.  Currently on comfort care. ?  ?Sepsis secondary to amputation stump infection ([place]) ?Carbapenem resistant Klebsiella pneumonia infection ?Left above-knee amputation stump infection with purulent drainage and odor on presentation with leukocytosis.  Brendan Campbell was tachycardic, tachypneic and hypothermic on presentation.  Brendan Campbell was initiated on code sepsis and antibiotics vancomycin Flagyl and imipenem were initially given.  Dr. Unk Lightning vascular surgery was informed as well. Procalcitonin was 7.0.  COVID and influenza was negative. ?Blood cultures showed  klebsiella pneumoniae, Carbapenem resistant..   Significant leukocytosis present. CTA of the left lower extremity with thrombosis of the distal SFA popliteal artery  trace foci of subcutaneous gas.   ? ?Atrial  fibrillation with rapid ventricular response with history of chronic atrial fibrillation..  Brendan Campbell had borderline hypotension.  Received IV digoxin.  Currently on comfort care. ?  ?CHF (congestive heart failure) ([place]) ?Brendan Campbell had missed dialysis.  Nephrology was initially consulted.  Currently on comfort care ? ?Essential hypertension ?Was hypotensive on admission.  Currently on comfort care. ? ?ESRD on hemodialysis ([place]) ?Currently on comfort care. ? ?Hyperlipidemia ?On comfort care. ?  ?Type 2 diabetes mellitus with diabetic chronic kidney disease ([place]) with hypoglycemia. ?Had episodes of hypoglycemia.  Currently on comfort care ? ?Goals of care, counseling/discussion ?Brendan Campbell with multiple comorbidities and poor prognosis.  Palliative care on board and Brendan Campbell has been initiated on comfort care. ?  ? ? ?Assessment & Plan: ? Principal Problem: ?  Carbapenem-resistant Klebsiella pneumoniae infection ?Active Problems: ?  Goals of care, counseling/discussion ?  Amputation stump infection ([place]) ?  CHF (congestive heart failure) ([place]) ?  Type 2 diabetes mellitus with diabetic chronic kidney disease ([place]) ?  Hyperlipidemia ?  ESRD on hemodialysis ([place]) ?  Chronic atrial fibrillation (HCC) ?  Essential hypertension ?  Encephalopathy acute ?  Gram-negative sepsis, unspecified ([place]) ?  Sacral wound ? ? ? ?Assessment and Plan: ?Goals of care, counseling/discussion ?Brendan Campbell with multiple co-morbidities and very poor prognosis. ?-Brendan Campbell seen by palliative care and Brendan Campbell now full comfort measures ? ?Amputation stump infection ([place]) ?Brendan Campbell with florid infection left AKA with purulent drainage and malodor. Marked leukcytosis. In ED code sepsis initiated with VAnc, Flagyl, Imipenem. ?With severe protein malnutrition, DM, poor adherence healing will be very challenging. Dr. Virl Cagey, vascular surgery, and assessed the Brendan Campbell and followed the Brendan Campbell early on during the hospitalization. ?-Brendan Campbell seen in consultation by  ID. ?-Blood cultures done  positive for Klebsiella pneumoniae, carbapenem resistant. ?-Brendan Campbell noted with a significant leukocytosis present. ?-CT angiogram of the left lower extremity with thrombosis of the distal SFA popliteal artery, trace foci of subcutaneous gas. ?-Brendan Campbell seen by palliative care. ?-Brendan Campbell has been now transitioned to full comfort measures. ? ?CHF (congestive heart failure) ([place]) ?No apparent decompensation. Pulmonary edema likely 2/2 fluid overload from missed HD. ?-Brendan Campbell was seen by nephrology earlier on during the hospitalization and noted to have a poor prognosis. ?-Palliative care consulted and Brendan Campbell now full comfort measures. ? ?Sacral wound ?Pressure Injury 05/19/2021 Sacrum Unstageable - Full thickness tissue loss in which the base of the injury is covered by slough (yellow, tan, gray, green or brown) and/or eschar (tan, brown or black) in the wound bed. (Active)  ?05/12/2021 2100  ?Location: Sacrum  ?Location Orientation:   ?Staging: Unstageable - Full thickness tissue loss in which the base of the injury is covered by slough (yellow, tan, gray, green or brown) and/or eschar (tan, brown or black) in the wound bed.  ?Wound Description (Comments):   ?Present on Admission: Yes  ?   ?Pressure Injury 05/16/21 Hip Left;Posterior Unstageable - Full thickness tissue loss in which the base of the injury is covered by slough (yellow, tan, gray, green or brown) and/or eschar (tan, brown or black) in the wound bed. Unstageable pressure injur (Active)  ?05/16/21   ?Location: Hip  ?Location Orientation: Left;Posterior  ?Staging: Unstageable - Full thickness tissue loss in which the base of the injury is covered by slough (yellow, tan, gray, green or brown) and/or eschar (tan, brown or black) in the wound bed.  ?Wound Description (Comments): Unstageable pressure injuries in 2 locations; upper and lower  ?Present on Admission: Yes  ? ? ? ?Encephalopathy acute ?Multi-factorial. ?-Likely secondary to  sepsis, hypoglycemia, hypotension. ?-Brendan Campbell seen by palliative care and has been transitioned to full comfort measures. ? ?Essential hypertension ?Hypotensive at admission 2/2 sepsis ?-Brendan Campbell seen by palliative care and Brendan Campbell transitioned to full comfort measures. ? ?Chronic atrial fibrillation ([place]) ?-Brendan Campbell noted to be borderline hypotensive on admission.  -Brendan Campbell received IV digoxin.   ?-Brendan Campbell seen by palliative care and has been transitioned to full comfort measures.   ? ?ESRD on hemodialysis ([place]) ?Brendan Campbell on HD - could not complete treatment on day of admission secondary to acute decompensation.  ?-Brendan Campbell seen by nephrology felt to have a poor prognosis.  ?-Brendan Campbell seen by palliative care and has been transitioned to full comfort measures.   ? ?Hyperlipidemia ?- Brendan Campbell has been transitioned to full comfort measures. ? ?Type 2 diabetes mellitus with diabetic chronic kidney disease ([place]) ?Was hypoglycemic at admission. No glycemic meds on med rec. ?-Brendan Campbell now full comfort measures. ? ? ? ? ?  ? ? ?DVT prophylaxis: Comfort care. ?Code Status: DNR. ?Family Communication: No family at bedside. ?Disposition: Likely in-hospital death. ? ?Status is: Inpatient ?Remains inpatient appropriate because: Severity of illness.  Brendan Campbell now comfort care likely in-hospital death. ?  ?Consultants:  ?ID: Dr.Comer 05/16/2021 ?Vascular surgery: Dr. Virl Cagey 05/17/2021 ?Nephrology: Dr.Schertz 05/16/2021 ?Palliative care: [name], NP 02/13/9676 ?Wound care: Julien Girt, RN 05/17/2021 ? ?Procedures:  ?CT angiogram left lower extremity 05/16/2021 ?CT head without contrast 06/03/2021 ?Chest x-ray 05/14/2021 ? ?Antimicrobials:  ?Anti-infectives (From admission, onward)  ? ? Start     Dose/Rate Route Frequency Ordered Stop  ? 05/16/21 2200  ceFEPIme (MAXIPIME) 1 g in sodium chloride 0.9 % 100 mL IVPB  Status:  Discontinued       ? 1  g ?200 mL/hr over 30 Minutes Intravenous Every 24 hours 05/16/21 0006 05/16/21 0859  ? 05/16/21 1200   ceftazidime-avibactam (AVYCAZ) 0.94 g in sodium chloride 0.9 % 50 mL IVPB  Status:  Discontinued       ? 0.94 g ?25 mL/hr over 2 Hours Intravenous Every 24 hours 05/16/21 0912 05/17/21 1513  ? 05/16/21 1000  ceftazidime-av

## 2021-05-21 DIAGNOSIS — R4182 Altered mental status, unspecified: Secondary | ICD-10-CM

## 2021-06-07 NOTE — Progress Notes (Signed)
Brendan Campbell stated pt would be going to [facility] in [place], [place].  ?

## 2021-06-07 NOTE — Death Summary Note (Signed)
? ?DEATH SUMMARY  ? ?Patient Details  ?Name: Brendan Campbell ?MRN: [id number] ?DOB: 01/15/1971 ?WUJ:WJXB, Weldon Picking, MD ?Admission/Discharge Information  ? ?Admit Date:  01-Jun-2021  ?Date of Death: Date of Death: 06/07/2021  ?Time of Death: Time of Death: 62  ?Length of Stay: 6  ? ?Principle Cause of death: Sepsis secondary to amputation stump infection and carbapenem resistant Klebsiella pneumoniae bacteremia, ? ?Hospital Diagnoses: ?Principal Problem: ?  Carbapenem-resistant Klebsiella pneumoniae infection ?Active Problems: ?  Goals of care, counseling/discussion ?  Amputation stump infection ([place]) ?  CHF (congestive heart failure) ([place]) ?  Type 2 diabetes mellitus with diabetic chronic kidney disease ([place]) ?  Hyperlipidemia ?  ESRD on hemodialysis ([place]) ?  Chronic atrial fibrillation (HCC) ?  Essential hypertension ?  Encephalopathy acute ?  Gram-negative sepsis, unspecified ([place]) ?  Sacral wound ? ? 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  In the ED, patient was mildly hypotensive and tachycardic with blood glucose level was low at 46.  Ammonia elevated at 107.  Lactate at 1.7.  WBC elevated at 27.9.  Chest x-ray showed interstitial edema.  Code sepsis was initiated and patient was started on antibiotic.  Patient was then admitted to hospital for further evaluation and treatment. ? ?Assessment and Plan: ? ?Acute metabolic encephalopathy  ?Multifactorial from sepsis, hypoglycemia. Bilirubin was elevated but AST ALT within normal range.  Currently on comfort care. ?  ?Sepsis secondary to  amputation stump infection ([place]) ?Carbapenem resistant Klebsiella pneumonia infection ?Left above-knee amputation stump infection with purulent drainage and odor on presentation with leukocytosis.  Patient was tachycardic, tachypneic and hypothermic on presentation.  Patient was initiated on code sepsis and antibiotics vancomycin Flagyl and imipenem were initially given.  Dr. Unk Lightning vascular surgery was informed as well. Procalcitonin was 7.0.  COVID and influenza was negative. ?Blood cultures showed  klebsiella pneumoniae, Carbapenem resistant..   Significant leukocytosis present. CTA of the left lower extremity with thrombosis of the distal SFA popliteal artery  trace foci of subcutaneous gas.   ? ?Atrial fibrillation with rapid ventricular response with history of chronic atrial fibrillation..  Patient had borderline hypotension.  Received IV digoxin.  Currently on comfort care. ?  ?CHF (congestive heart failure) ([place]) ?Patient had missed dialysis.  Nephrology was initially consulted.  Currently on comfort care ? ?Essential hypertension ?Was hypotensive on admission.  Currently on comfort care. ? ?ESRD on hemodialysis ([place]) ?Currently on comfort care. ? ?Hyperlipidemia ?On comfort care. ?  ?Type 2 diabetes mellitus with diabetic chronic kidney disease ([place]) with hypoglycemia. ?Had episodes of hypoglycemia.  Currently on comfort care ? ?Goals of care, counseling/discussion ?Patient with multiple comorbidities and poor prognosis.  Palliative care on board and patient has been initiated on comfort care. ? ?Assessment and Plan: ?Goals of care, counseling/discussion ?- Patient with multiple co-morbidities and very poor prognosis. ?-Patient seen by palliative care and patient transition to full comfort measures. ?-Patient kept comfortable and subsequently died at 0250 hrs. on 06-07-21. ? 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 Marked leukcytosis.  In ED code sepsis initiated with VAnc, Flagyl, Imipenem. ?  With severe protein malnutrition, DM, poor adherence healing will be very challenging. Dr. Virl Cagey, vascular surgery, and assessed the patient and followed the patient early on during the hospitalization. ?-Patient seen in consultation by ID. ?-Blood cultures done positive for Klebsiella pneumoniae, carbapenem resistant. ?-Patient noted with a significant leukocytosis present. ?-CT angiogram of the left lower extremity with thrombosis of the distal SFA popliteal artery, trace foci of subcutaneous gas. ?-Patient seen by palliative care. ?-Patient was subsequently transitioned to full comfort measures. ?-Patient subsequently died at 36 hrs. on 20-Jun-2021. ? ?CHF (congestive heart failure) ([place]) ?No apparent decompensation. Pulmonary edema likely 2/2 fluid overload from missed HD. ?-Patient was seen by nephrology earlier on during the hospitalization and noted to have a poor prognosis. ?-Palliative care consulted and patient transition to full comfort measures. ?-Patient subsequently died at 18 hrs. on 20-Jun-2021. ? ?Sacral wound ?Pressure Injury 06/02/2021 Sacrum Unstageable - Full thickness tissue loss in which the base of the injury is covered by slough (yellow, tan, gray, green or brown) and/or eschar (tan, brown or black) in the wound bed. (Active)  ?05/25/2021 2100  ?Location: Sacrum  ?Location Orientation:   ?Staging: Unstageable - Full thickness tissue loss in which the base of the injury is covered by slough (yellow, tan, gray, green or brown) and/or eschar (tan, brown or black) in the wound bed.  ?Wound Description (Comments):   ?Present on Admission: Yes  ?   ?Pressure Injury 05/16/21 Hip Left;Posterior Unstageable - Full thickness tissue loss in which the base of the injury is covered by slough (yellow, tan, gray, green or brown) and/or eschar (tan, brown or black) in the wound bed. Unstageable pressure injur (Active)  ?05/16/21   ?Location: Hip   ?Location Orientation: Left;Posterior  ?Staging: Unstageable - Full thickness tissue loss in which the base of the injury is covered by slough (yellow, tan, gray, green or brown) and/or eschar (tan, brown or black) in the wound bed.  ?Wound Description (Comments): Unstageable pressure injuries in 2 locations; upper and lower  ?Present on Admission: Yes  ? ? ? ?Encephalopathy acute ?Multi-factorial. ?-Likely secondary to sepsis, hypoglycemia, hypotension. ?-Patient seen by palliative care and transitioned to full comfort measures. ? ?Essential hypertension ?Hypotensive at admission 2/2 sepsis ?-Patient seen by palliative care and patient transitioned to full comfort measures. ?-Patient subsequently died at 28 hrs. 06/20/21. ? ?Chronic atrial fibrillation (HCC) ?-Patient noted to be borderline hypotensive on admission.  -Patient received IV digoxin.   ?-Patient seen by palliative care and transitioned to full comfort measures.   ? ?ESRD on hemodialysis ([place]) ?Patient on HD - could not complete treatment on day of admission secondary to acute decompensation.  ?-Patient seen by nephrology felt to have a poor prognosis.  ?-Patient seen by palliative care and has been transitioned to full comfort measures.   ?-Patient subsequently died at 11 hrs. on 06/20/21 ? ?Hyperlipidemia ?- Patient transitioned to full comfort measures. ? ?Type 2 diabetes mellitus with diabetic chronic kidney disease ([place]) ?Was hypoglycemic at admission. No glycemic meds on med rec. ?-Patient was transitioned to full comfort measures. ? ? ? ? ?  ? ?Procedures:  ?CT angiogram left lower extremity 05/16/2021 ?CT head without contrast 05/25/2021 ?Chest x-ray 05/19/2021 ? ?Consultations:  ?ID: Dr.Comer 05/16/2021 ?Vascular surgery: Dr. Virl Cagey 05/27/2021 ?Nephrology: Dr.Schertz 05/16/2021 ?Palliative care: [name], NP 03/13/4008 ?Wound care: Julien Girt, RN 05/17/2021 ? ?The results of significant diagnostics from this hospitalization (including imaging,  microbiology, ancillary and laboratory) are listed below for reference.  ? ?  Significant Diagnostic Studies: ?CT Head Wo Contrast ? ?Result Date: 05/11/2021 ?CLINICAL DATA:  Mental status change EXAM: CT HEAD WITH

## 2021-06-07 NOTE — Progress Notes (Deleted)
Pt noted with no respirations or heart beat. Second Verification by [employer].  ?

## 2021-06-07 NOTE — Progress Notes (Signed)
Pt noted with no respirations,no pulse noted. 2 2nd nurse verified, Loma Sousa RN ?

## 2021-06-07 NOTE — Progress Notes (Signed)
Pts relative called Shara Blazing and informed of pts death. Gregary Signs stated she would call the rest of the family.  ?

## 2021-06-07 DEATH — deceased
